# Patient Record
Sex: Female | Born: 1965 | Race: White | Hispanic: No | Marital: Single | Smoking: Former smoker
Health system: Southern US, Community
[De-identification: ages and names within clinical notes are randomized; demographics above are authoritative.]

## PROBLEM LIST (undated history)

## (undated) ENCOUNTER — Emergency Department (HOSPITAL_COMMUNITY): Discharge status: Against Medical Advice | Payer: Self-pay

## (undated) VITALS — BP 106/66 | HR 109 | Temp 97.6°F | Resp 16 | Ht 67.5 in | Wt 168.0 lb

## (undated) DIAGNOSIS — F1010 Alcohol abuse, uncomplicated: Secondary | ICD-10-CM

## (undated) DIAGNOSIS — E079 Disorder of thyroid, unspecified: Secondary | ICD-10-CM

## (undated) DIAGNOSIS — F419 Anxiety disorder, unspecified: Secondary | ICD-10-CM

## (undated) DIAGNOSIS — F1020 Alcohol dependence, uncomplicated: Secondary | ICD-10-CM

## (undated) DIAGNOSIS — D696 Thrombocytopenia, unspecified: Secondary | ICD-10-CM

## (undated) DIAGNOSIS — F1310 Sedative, hypnotic or anxiolytic abuse, uncomplicated: Secondary | ICD-10-CM

## (undated) DIAGNOSIS — M549 Dorsalgia, unspecified: Secondary | ICD-10-CM

## (undated) DIAGNOSIS — E039 Hypothyroidism, unspecified: Secondary | ICD-10-CM

## (undated) DIAGNOSIS — R569 Unspecified convulsions: Secondary | ICD-10-CM

## (undated) DIAGNOSIS — D649 Anemia, unspecified: Secondary | ICD-10-CM

## (undated) DIAGNOSIS — E162 Hypoglycemia, unspecified: Secondary | ICD-10-CM

## (undated) DIAGNOSIS — F32A Depression, unspecified: Secondary | ICD-10-CM

## (undated) DIAGNOSIS — F1110 Opioid abuse, uncomplicated: Secondary | ICD-10-CM

## (undated) DIAGNOSIS — F329 Major depressive disorder, single episode, unspecified: Secondary | ICD-10-CM

## (undated) DIAGNOSIS — T1491XA Suicide attempt, initial encounter: Secondary | ICD-10-CM

## (undated) DIAGNOSIS — K279 Peptic ulcer, site unspecified, unspecified as acute or chronic, without hemorrhage or perforation: Secondary | ICD-10-CM

## (undated) HISTORY — PX: TUBAL LIGATION: SHX77

## (undated) HISTORY — PX: CHOLECYSTECTOMY: SHX55

## (undated) HISTORY — PX: GASTRIC BYPASS: SHX52

## (undated) HISTORY — PX: ESOPHAGOGASTRODUODENOSCOPY: SHX1529

## (undated) HISTORY — PX: ABDOMINAL SURGERY: SHX537

---

## 2011-01-23 ENCOUNTER — Emergency Department (HOSPITAL_COMMUNITY)
Admission: EM | Admit: 2011-01-23 | Discharge: 2011-01-23 | Disposition: A | Discharge status: Home / Self Care | Payer: Self-pay | Attending: Emergency Medicine | Admitting: Emergency Medicine

## 2011-01-23 DIAGNOSIS — R109 Unspecified abdominal pain: Secondary | ICD-10-CM | POA: Insufficient documentation

## 2011-01-23 DIAGNOSIS — R350 Frequency of micturition: Secondary | ICD-10-CM | POA: Insufficient documentation

## 2011-01-23 DIAGNOSIS — N814 Uterovaginal prolapse, unspecified: Secondary | ICD-10-CM | POA: Insufficient documentation

## 2011-01-23 LAB — URINALYSIS, ROUTINE W REFLEX MICROSCOPIC
Bilirubin Urine: NEGATIVE
Ketones, ur: NEGATIVE mg/dL
Nitrite: NEGATIVE
Protein, ur: NEGATIVE mg/dL
Urobilinogen, UA: 0.2 mg/dL (ref 0.0–1.0)

## 2011-02-15 ENCOUNTER — Encounter (INDEPENDENT_AMBULATORY_CARE_PROVIDER_SITE_OTHER): Payer: Self-pay | Admitting: Physician Assistant

## 2011-02-15 ENCOUNTER — Other Ambulatory Visit: Payer: Self-pay | Admitting: Physician Assistant

## 2011-02-15 DIAGNOSIS — R102 Pelvic and perineal pain: Secondary | ICD-10-CM

## 2011-02-15 DIAGNOSIS — N949 Unspecified condition associated with female genital organs and menstrual cycle: Secondary | ICD-10-CM

## 2011-02-16 NOTE — Group Therapy Note (Signed)
Jacqueline [name], HUMANN             ACCOUNT NO.:  [id number]  MEDICAL RECORD NO.:  [id number]           PATIENT TYPE:  A  LOCATION:  [facility]                   FACILITY:  [facility]  PHYSICIAN:  Maylon Cos, CNM    DATE OF BIRTH:  06/28/1966  DATE OF SERVICE:  02/15/2011                                 CLINIC NOTE  The patient is being seen in GYN Clinic at [facility].  REASON FOR TODAY'S VISIT:  Followup from [facility] secondary to questionable bladder prolapse.  HISTORY OF PRESENT ILLNESS:  The patient was seen at [facility] ER on January 23, 2011, with lower abdominal pain.  The patient states that she went in believing that with lower abdominal pain that lasted for the last several months, she thought like she has had urinary symptoms, urinary tract infections that she has had several times before. Symptoms were also related to a vaginal bulge that started approximately 1 month ago that she noticed with urination that felt like a balloon coming from the vagina.  She also notices that when she has a bowel movement that she has a hard protrusion from the vagina as well that seems to be different from the balloon like.  She states that she was seen in the ER at [facility] and they told her that she likely had a bladder prolapse.  The ER note is unavailable at the time of her visit today, however, the patient states that they did check her urine and told her that she did not have urinary tract infection.  PERSONAL MEDICAL HISTORY:  She has no known drug allergies.  CURRENT MEDICATIONS:  Synthroid and omeprazole.  HEALTH CARE MAINTENANCE:  She is up to date for rubella, chickenpox, and tetanus.  Last mammogram was 2 years ago and it was normal.  She had a fecal occult blood in 2007 and it was normal.  She has never had a colonoscopy.  Her last dental exam was in 2012.  MENSTRUAL HISTORY:  She has regular cycles.  Menarche was at age 66. She has 28-day cycles.  Her periods  lasts for 5 days.  She says they are medium flow with minimal pain.  She has had tubal ligation.  She is not currently in a sexual relationship.  OBSTETRICAL HISTORY:  She is a gravida 5, para 3.  She had 1 miscarriage and 1 termination, and her last pregnancy was in February 1997.  She has had vaginal deliveries uncomplicated.  Her last Pap smear was done in November 2011, she has no history of abnormals.  STD history is negative.  PERSONAL MEDICAL HISTORY:  Positive for arthritis, stomach ulcers, anemia, and hypothyroid.  She had a GI bleed in 1997.  SURGICAL HISTORY:  She had a blood transfusion in 1997 when she had a GI bleed.  She had an EGD for repair of bleeding ulcer, cholecystectomy in 2006, gastric bypass in 2003, and tubal ligation in 1997.  SOCIAL HISTORY:  She is currently unemployed.  She does not smoke.  She does not drink alcohol.  She denies the use of IV drugs or any other illicit drugs.  She denies physical or  sexual abuse.  She has had a 1 partner in the last year.  FAMILY HISTORY:  Positive for diabetes in her grandparents, heart disease and heart attack in grandfather, high blood pressure in her grandparents, colon cancer in her grandfather, and DVT in  her grandmother.  Her history is negative for breast cancer, ovarian cancer, or uterine cancer.  SYSTEMIC REVIEW:  Positive for abdominal pain, urinary frequency, and stress incontinence that she further describes as she has minimal dribbling that she has to wear a panty liner for this, changes twice a day.  She also sometimes experiences dribbling after emptying her bladder that she stands and leaks urine afterwards.  PHYSICAL EXAMINATION:  GENERAL:  Today, Shamirah is a pleasant Caucasian female who appears her stated age of 45.  She is in no apparent distress. VITAL SIGNS:  Stable.  Her temperature is 97.5, her pulse is 70, her blood pressure is 122/74, her weight is 170.5, her height is 69  inches. HEENT:  Grossly normal. ABDOMEN:  Soft and nontender.  She does have suprapubic tenderness, more on the left than the right.  No guarding.  No rebound.  She has no pain with passive movement or shaking of the table.  She is able to get on and off the table without difficulty and heel drop test is negative. GENITOURINARY:  External structure of the genitalia are normal.  With bearing down, she does have a noted grade 2/grade 3 rectocele and grade 1 cystocele.  Bimanual exam reveals nonenlarged, nontender uterus. Nonenlarged, nontender adnexa.  On the right, she does have some tenderness.  On the left, however, there is no enlargement appreciated during examination. EXTREMITIES:  Equal and reactive x4 without edema in the lower extremities.  ASSESSMENT: 1. Pelvic pain. 2. Questionable interstitial cystitis.  PLAN: 1. Pelvic ultrasound. 2. Health questionnaire for IC. 3. TSH test has not been done since November and prescription will be     given as the patient is out of her Synthroid. 4. Kegel exercises literature and explanation has been given. 5. Also, sent the urine culture today to assess for infection,     possible cause of her lower abdominal pain.  I have a strong     suspicion that this could be urinary specifically interstitial     cystitis related.  If so, we will refer her to Urology, possibly     starting her on the trial of Elmiron while awaiting for consult.  FOLLOWUP:  The patient should follow up in 2-3 weeks after pelvic ultrasound to discuss results as well as assess any significant changes with Kegel exercises.          ______________________________ Maylon Cos, CNM    SS/MEDQ  D:  02/15/2011  T:  02/16/2011  Job:  [id number]

## 2011-02-21 ENCOUNTER — Other Ambulatory Visit (HOSPITAL_COMMUNITY): Payer: Self-pay

## 2011-02-21 ENCOUNTER — Ambulatory Visit (HOSPITAL_COMMUNITY): Admission: RE | Admit: 2011-02-21 | Payer: Self-pay | Source: Ambulatory Visit

## 2011-02-23 ENCOUNTER — Ambulatory Visit (HOSPITAL_COMMUNITY)
Admission: RE | Admit: 2011-02-23 | Discharge: 2011-02-23 | Disposition: A | Discharge status: Home / Self Care | Payer: Self-pay | Source: Ambulatory Visit | Attending: Physician Assistant | Admitting: Physician Assistant

## 2011-02-23 DIAGNOSIS — N949 Unspecified condition associated with female genital organs and menstrual cycle: Secondary | ICD-10-CM | POA: Insufficient documentation

## 2011-02-23 DIAGNOSIS — R102 Pelvic and perineal pain: Secondary | ICD-10-CM

## 2011-03-01 ENCOUNTER — Ambulatory Visit: Payer: Self-pay | Admitting: Physician Assistant

## 2011-03-15 ENCOUNTER — Ambulatory Visit (INDEPENDENT_AMBULATORY_CARE_PROVIDER_SITE_OTHER): Payer: Self-pay | Admitting: Physician Assistant

## 2011-03-15 DIAGNOSIS — N949 Unspecified condition associated with female genital organs and menstrual cycle: Secondary | ICD-10-CM

## 2011-03-16 NOTE — Group Therapy Note (Signed)
NAMEKENYIA, Jacqueline [name]             ACCOUNT NO.:  [id number]  MEDICAL RECORD NO.:  [id number]           PATIENT TYPE:  A  LOCATION:  [facility]                   FACILITY:  [facility]  PHYSICIAN:  Maylon Cos, CNM    DATE OF BIRTH:  1966-04-01  DATE OF SERVICE:  03/15/2011                                 CLINIC NOTE  The patient is being seen at the [facility].  Reason for today's visit is followup results of pelvic ultrasound and labs.  HISTORY OF PRESENT ILLNESS:  The patient is following up from previous visit with myself on May 10, as she presented with complaints of urinary symptoms and pelvic pressure that she was suspicious of urinary tract infection, however, she was diagnosed with a cystocele and rectocele, questionable bladder prolapse by the ER staff at [facility]. At the time of her last visit, I strongly questioned interstitial cystitis as the cause of her symptoms rather than being treat GYN related issues.  We did have her follow up with a pelvic ultrasound and lab work which she returns for those today.  Additionally, she has been Kegel exercises to work on her grade 2/3 rectocele and grade 1 cystocele.  She returns today without complaints and the following results were shared with the patient.  Pelvic ultrasound was done on May 18 was found to be normal-appearing uterus and ovaries with a small amount of simple free fluid that was likely physiologic noted.  The patient's TSH is 8.083.  An Endocrinology referral had already been made, the patient has appointment this afternoon.  The patient scored a much higher than normal on her puff test assessing for interstitial cystitis and plans at her last visit was to refer to Urology and started on Elmiron for further evaluation of possible IC diagnosis.  We have again discussed that again today.  The patient agrees with that plan.  ASSESSMENT: 1. Probable interstitial cystitis. 2. Continued pelvic  pain relieved by mild pain medication. 3. Hyperthyroidism.  PLAN: 1. The patient should follow up today with Dr. Kemper Durie in Endocrinology     as scheduled at [facility]. 2. The patient has been given a prescription for Elmiron 100 mg to be     taken 3 times a day along with discount card and additional ICD     information. 3. The patient is being made referral to [facility] for further     evaluation of IC symptoms or other urologic diagnosis.  FOLLOWUP:  The patient should follow up in our GYN Clinic in 3 months for reevaluation of symptoms or sooner if problems become worse.          ______________________________ Maylon Cos, CNM    SS/MEDQ  D:  03/15/2011  T:  03/16/2011  Job:  [id number]

## 2011-04-09 ENCOUNTER — Emergency Department (HOSPITAL_COMMUNITY)
Admission: EM | Admit: 2011-04-09 | Discharge: 2011-04-10 | Disposition: A | Discharge status: Home / Self Care | Payer: Self-pay | Attending: Emergency Medicine | Admitting: Emergency Medicine

## 2011-04-09 DIAGNOSIS — F1910 Other psychoactive substance abuse, uncomplicated: Secondary | ICD-10-CM | POA: Insufficient documentation

## 2011-04-09 DIAGNOSIS — Z79899 Other long term (current) drug therapy: Secondary | ICD-10-CM | POA: Insufficient documentation

## 2011-04-09 DIAGNOSIS — E039 Hypothyroidism, unspecified: Secondary | ICD-10-CM | POA: Insufficient documentation

## 2011-04-09 LAB — RAPID URINE DRUG SCREEN, HOSP PERFORMED
Amphetamines: NOT DETECTED
Benzodiazepines: NOT DETECTED
Opiates: NOT DETECTED
Tetrahydrocannabinol: NOT DETECTED

## 2011-04-09 LAB — BASIC METABOLIC PANEL
CO2: 24 mEq/L (ref 19–32)
Calcium: 8.5 mg/dL (ref 8.4–10.5)
Creatinine, Ser: 0.83 mg/dL (ref 0.50–1.10)
GFR calc non Af Amer: >60 mL/min (ref >60)
Glucose, Bld: 91 mg/dL (ref 70–99)

## 2011-04-09 LAB — DIFFERENTIAL
Basophils Absolute: 0.1 10*3/uL (ref 0.0–0.1)
Basophils Relative: 1 % (ref 0–1)
Eosinophils Absolute: 0.3 10*3/uL (ref 0.0–0.7)
Eosinophils Relative: 5 % (ref 0–5)
Monocytes Absolute: 0.6 10*3/uL (ref 0.1–1.0)
Monocytes Relative: 10 % (ref 3–12)
Neutro Abs: 2.8 10*3/uL (ref 1.7–7.7)

## 2011-04-09 LAB — CBC
Hemoglobin: 10.2 g/dL — ABNORMAL LOW (ref 12.0–15.0)
MCH: 29 pg (ref 26.0–34.0)
MCHC: 33.2 g/dL (ref 30.0–36.0)
RDW: 15.1 % (ref 11.5–15.5)

## 2011-04-09 LAB — URINALYSIS, ROUTINE W REFLEX MICROSCOPIC
Glucose, UA: NEGATIVE mg/dL
Ketones, ur: NEGATIVE mg/dL
Leukocytes, UA: NEGATIVE
Protein, ur: NEGATIVE mg/dL
Urobilinogen, UA: 0.2 mg/dL (ref 0.0–1.0)

## 2011-04-09 LAB — URINE MICROSCOPIC-ADD ON

## 2011-04-10 LAB — HEPATIC FUNCTION PANEL
Alkaline Phosphatase: 63 U/L (ref 39–117)
Bilirubin, Direct: 0.1 mg/dL (ref 0.0–0.3)
Indirect Bilirubin: 0.1 mg/dL — ABNORMAL LOW (ref 0.3–0.9)
Total Bilirubin: 0.2 mg/dL — ABNORMAL LOW (ref 0.3–1.2)

## 2011-04-10 LAB — ETHANOL: Alcohol, Ethyl (B): <11 mg/dL (ref 0–11)

## 2011-06-02 ENCOUNTER — Encounter: Payer: Self-pay | Admitting: Emergency Medicine

## 2011-06-02 ENCOUNTER — Emergency Department (HOSPITAL_COMMUNITY)
Admission: EM | Admit: 2011-06-02 | Discharge: 2011-06-02 | Discharge status: Against Medical Advice | Payer: Self-pay | Attending: Emergency Medicine | Admitting: Emergency Medicine

## 2011-06-02 DIAGNOSIS — F1021 Alcohol dependence, in remission: Secondary | ICD-10-CM | POA: Insufficient documentation

## 2011-06-02 DIAGNOSIS — F1910 Other psychoactive substance abuse, uncomplicated: Secondary | ICD-10-CM | POA: Insufficient documentation

## 2011-06-02 DIAGNOSIS — F341 Dysthymic disorder: Secondary | ICD-10-CM | POA: Insufficient documentation

## 2011-06-02 DIAGNOSIS — F17200 Nicotine dependence, unspecified, uncomplicated: Secondary | ICD-10-CM | POA: Insufficient documentation

## 2011-06-02 DIAGNOSIS — Z862 Personal history of diseases of the blood and blood-forming organs and certain disorders involving the immune mechanism: Secondary | ICD-10-CM | POA: Insufficient documentation

## 2011-06-02 HISTORY — DX: Peptic ulcer, site unspecified, unspecified as acute or chronic, without hemorrhage or perforation: K27.9

## 2011-06-02 HISTORY — DX: Anxiety disorder, unspecified: F41.9

## 2011-06-02 HISTORY — DX: Depression, unspecified: F32.A

## 2011-06-02 HISTORY — DX: Anemia, unspecified: D64.9

## 2011-06-02 HISTORY — DX: Major depressive disorder, single episode, unspecified: F32.9

## 2011-06-02 HISTORY — DX: Sedative, hypnotic or anxiolytic abuse, uncomplicated: F13.10

## 2011-06-02 HISTORY — DX: Alcohol abuse, uncomplicated: F10.10

## 2011-06-02 LAB — DIFFERENTIAL
Basophils Absolute: 0.1 10*3/uL (ref 0.0–0.1)
Basophils Relative: 1 % (ref 0–1)
Monocytes Absolute: 0.8 10*3/uL (ref 0.1–1.0)
Neutro Abs: 5.7 10*3/uL (ref 1.7–7.7)
Neutrophils Relative %: 69 % (ref 43–77)

## 2011-06-02 LAB — CBC
MCHC: 31.9 g/dL (ref 30.0–36.0)
Platelets: 588 10*3/uL — ABNORMAL HIGH (ref 150–400)
RDW: 16.8 % — ABNORMAL HIGH (ref 11.5–15.5)

## 2011-06-02 LAB — URINALYSIS, ROUTINE W REFLEX MICROSCOPIC
Bilirubin Urine: NEGATIVE
Leukocytes, UA: NEGATIVE
Nitrite: NEGATIVE
Specific Gravity, Urine: <1.005 — ABNORMAL LOW (ref 1.005–1.030)
pH: 5.5 (ref 5.0–8.0)

## 2011-06-02 LAB — BASIC METABOLIC PANEL
Chloride: 100 mEq/L (ref 96–112)
Creatinine, Ser: 1.01 mg/dL (ref 0.50–1.10)
GFR calc Af Amer: >60 mL/min (ref >60)
Potassium: 3.6 mEq/L (ref 3.5–5.1)
Sodium: 136 mEq/L (ref 135–145)

## 2011-06-02 LAB — PREGNANCY, URINE: Preg Test, Ur: NEGATIVE

## 2011-06-02 LAB — RAPID URINE DRUG SCREEN, HOSP PERFORMED
Amphetamines: NOT DETECTED
Opiates: NOT DETECTED

## 2011-06-02 MED ORDER — NICOTINE 14 MG/24HR TD PT24
14.0000 mg | MEDICATED_PATCH | Freq: Once | TRANSDERMAL | Status: DC
  Administered 2011-06-02: 14 mg via TRANSDERMAL
  Filled 2011-06-02: qty 1

## 2011-06-02 NOTE — ED Notes (Signed)
Patient ambulatory to restroom with steady gait. Patient returned to room without complications. Lab at bedside to collect blood.  Patient remains restless, and requesting to see fiance multiple times. Stating "if he doesn't go to rehab, I'll leave, I won't go either".  Aunt is at bedside.

## 2011-06-02 NOTE — ED Notes (Signed)
Patient belongings (clothes, purse, shoes) returned to patient prior to leaving the emergency department.

## 2011-06-02 NOTE — ED Notes (Signed)
Ice chips provided to pt per request.

## 2011-06-02 NOTE — ED Notes (Signed)
Patient also reports having some bright red rectal bleeding x4-5 days ago. Pt has hx of bleeding peptic ulcers.

## 2011-06-02 NOTE — ED Provider Notes (Signed)
Scribed for [employer]. Bernette Mayers, MD, the patient was seen in room APA15/APA15. This chart was scribed by [employer]. The patient's care started at 12:19  CSN: [id number] Arrival date & time: 06/02/2011 10:40 AM  Chief Complaint  Patient presents with  . Addiction Problem   HPI Jacqueline [name] is a 45 y.o. female with a history of Alcohol use, who presents to the Emergency Department complaining of addiction problem. Patient reports she is "hooked on benzodiazepines , lysterine and EtOH" and that her "drinking has been out of control". Patient states she used all three drugs today. She states that she has been through detox thee times in the past. There are no other associated symptoms and no other alleviating or aggravating factors.    Past Medical History  Diagnosis Date  . Peptic ulcer   . Alcohol abuse   . Anemia   . Benzodiazepine abuse   . Depression   . Anxiety     Past Surgical History  Procedure Date  . Cholecystectomy   . Abdominal surgery   . Esophagogastroduodenoscopy   . Gastric bypass   . Tubal ligation     Family History  Problem Relation Age of Onset  . Cancer Father   . Cancer Other     History  Substance Use Topics  . Smoking status: Current Everyday Smoker -- 1.0 packs/day for 10 years    Types: Cigarettes  . Smokeless tobacco: Never Used  . Alcohol Use: Yes     patient states  "a lot."    OB History    Grav Para Term Preterm Abortions TAB SAB Ect Mult Living   3 3  3      3       Review of Systems  Constitutional: Negative for activity change and appetite change.  Gastrointestinal: Negative for nausea, vomiting and diarrhea.  Neurological: Negative for light-headedness.  All other systems reviewed and are negative.    Physical Exam  BP 117/77  Pulse 81  Temp(Src) 98 F (36.7 C) (Oral)  Resp 18  Ht 5\' 9"  (1.753 m)  Wt 175 lb (79.379 kg)  BMI 25.84 kg/m2  SpO2 100%  LMP 06/02/2011  Physical Exam  Constitutional: She is  oriented to person, place, and time. She appears well-developed and well-nourished. No distress.  HENT:  Head: Normocephalic and atraumatic.  Mouth/Throat: No oropharyngeal exudate.  Eyes: Conjunctivae and EOM are normal. Pupils are equal, round, and reactive to light.  Neck: Neck supple. No tracheal deviation present.  Cardiovascular: Normal rate, regular rhythm and normal heart sounds.   No murmur heard. Pulmonary/Chest: Effort normal and breath sounds normal. No respiratory distress. She has no wheezes. She has no rales.  Abdominal: Soft. Bowel sounds are normal. She exhibits no distension. There is no tenderness. There is no rebound and no guarding.  Musculoskeletal: She exhibits no edema.  Neurological: She is alert and oriented to person, place, and time. No cranial nerve deficit.  Skin: Skin is warm and dry. No rash noted. No erythema.  Psychiatric:       Argumentative No suicidal or homicidal ideations    ED Course  Procedures  OTHER DATA REVIEWED: Nursing notes, vital signs, and past medical records reviewed.    DIAGNOSTIC STUDIES: Oxygen Saturation is 100% on room air, normal by my interpretation.     ED COURSE / COORDINATION OF CARE: 12:20 -EDMD discussed case with patient and performed a Hemoccult test. (Chaperone present). EDMD ordered the following   MDM: Pt  cleared medically. She required redirection to her room numerous times and was demanding to leave room to see fiance (also her for medical clearance) and to go outside to smoke. She was advised multiple times to stay in her room. ACT team has evaluated her and she is demanding Ultram for a headache. Offered her Tylenol at which time she became angry and confrontational and stated she wanted to leave. She is not suicidal or homicidal and is here voluntarily. No evidence of clinical intoxication and no reason to hold her against her will. Will have her sign AMA form.     Marnee Sherrard B. Bernette Mayers, MD 06/02/11 1429

## 2011-06-02 NOTE — ED Notes (Signed)
Patient refusing to stay and wants to leave AMA. Patient spoke with [facility] with ACT team. Dr. Bernette Mayers made aware of patient wanting to leave. Spoke with [facility] and she reports patient does not qualify for commitment and is free to leave AMA if she wants. Patient refusing to sign AMA form and left department with a family member. Patient in NAD at time of departure and left ED with steady gait.

## 2011-06-02 NOTE — ED Notes (Signed)
Patient requesting detox for benzo and alcohol. Patient reports taking benzo and drinking Listerine "a few hours ago."

## 2011-06-02 NOTE — ED Notes (Signed)
Patient reports wanting detox from ETOH and klonopin. Denies taking any other drugs. Patient reports drinking approximately 24 12oz beers per day, "especially since I ran out of klonopin. I didn't know what else to do". Reports being out of Klonopin for 3 days now. States she is "shaky and anxious".

## 2011-06-02 NOTE — ED Notes (Signed)
Pt requested to see AC.  Cecile Sheerer [facility] spoke with pt and pt was asking for her pocket book.  Pt says she has money in her purse.  Went in room with [facility] and offered to get money out of purse to lock up with security but pt refused, locked pocket book in cabinet in ED.

## 2011-06-04 ENCOUNTER — Encounter (HOSPITAL_COMMUNITY): Payer: Self-pay | Admitting: Emergency Medicine

## 2011-06-04 ENCOUNTER — Emergency Department (HOSPITAL_COMMUNITY)
Admission: EM | Admit: 2011-06-04 | Discharge: 2011-06-04 | Disposition: A | Discharge status: Home / Self Care | Payer: Self-pay | Attending: Emergency Medicine | Admitting: Emergency Medicine

## 2011-06-04 DIAGNOSIS — F1910 Other psychoactive substance abuse, uncomplicated: Secondary | ICD-10-CM | POA: Insufficient documentation

## 2011-06-04 DIAGNOSIS — F17200 Nicotine dependence, unspecified, uncomplicated: Secondary | ICD-10-CM | POA: Insufficient documentation

## 2011-06-04 DIAGNOSIS — F341 Dysthymic disorder: Secondary | ICD-10-CM | POA: Insufficient documentation

## 2011-06-04 HISTORY — DX: Opioid abuse, uncomplicated: F11.10

## 2011-06-04 HISTORY — DX: Dorsalgia, unspecified: M54.9

## 2011-06-04 HISTORY — DX: Disorder of thyroid, unspecified: E07.9

## 2011-06-04 HISTORY — DX: Alcohol dependence, uncomplicated: F10.20

## 2011-06-04 LAB — BASIC METABOLIC PANEL
CO2: 23 mEq/L (ref 19–32)
Chloride: 99 mEq/L (ref 96–112)
Potassium: 3.6 mEq/L (ref 3.5–5.1)
Sodium: 136 mEq/L (ref 135–145)

## 2011-06-04 LAB — CBC
HCT: 32.5 % — ABNORMAL LOW (ref 36.0–46.0)
RDW: 16.8 % — ABNORMAL HIGH (ref 11.5–15.5)
WBC: 7 10*3/uL (ref 4.0–10.5)

## 2011-06-04 LAB — DIFFERENTIAL
Basophils Absolute: 0.1 10*3/uL (ref 0.0–0.1)
Lymphocytes Relative: 17 % (ref 12–46)
Monocytes Absolute: 0.9 10*3/uL (ref 0.1–1.0)
Neutro Abs: 4.8 10*3/uL (ref 1.7–7.7)
Neutrophils Relative %: 68 % (ref 43–77)

## 2011-06-04 LAB — HEPATIC FUNCTION PANEL
ALT: 22 U/L (ref 0–35)
AST: 26 U/L (ref 0–37)
Alkaline Phosphatase: 68 U/L (ref 39–117)
Bilirubin, Direct: 0.1 mg/dL (ref 0.0–0.3)
Indirect Bilirubin: 0.1 mg/dL — ABNORMAL LOW (ref 0.3–0.9)

## 2011-06-04 LAB — RAPID URINE DRUG SCREEN, HOSP PERFORMED
Benzodiazepines: NOT DETECTED
Cocaine: POSITIVE — AB
Opiates: NOT DETECTED

## 2011-06-04 LAB — ETHANOL: Alcohol, Ethyl (B): 134 mg/dL — ABNORMAL HIGH (ref 0–11)

## 2011-06-04 LAB — PREGNANCY, URINE: Preg Test, Ur: NEGATIVE

## 2011-06-04 MED ORDER — ALUM & MAG HYDROXIDE-SIMETH 200-200-20 MG/5ML PO SUSP: 30.0000 mL | ORAL | Status: DC | PRN

## 2011-06-04 MED ORDER — LORAZEPAM 1 MG PO TABS: 1.0000 mg | ORAL_TABLET | Freq: Three times a day (TID) | ORAL | Status: DC | PRN

## 2011-06-04 MED ORDER — LORAZEPAM 1 MG PO TABS
1.0000 mg | ORAL_TABLET | Freq: Once | ORAL | Status: AC
  Administered 2011-06-04: 1 mg via ORAL
  Filled 2011-06-04: qty 1

## 2011-06-04 MED ORDER — ACETAMINOPHEN 325 MG PO TABS: 650.0000 mg | ORAL_TABLET | ORAL | Status: DC | PRN

## 2011-06-04 MED ORDER — ZOLPIDEM TARTRATE 5 MG PO TABS: 5.0000 mg | ORAL_TABLET | Freq: Every evening | ORAL | Status: DC | PRN

## 2011-06-04 MED ORDER — PANTOPRAZOLE SODIUM 40 MG PO TBEC
80.0000 mg | DELAYED_RELEASE_TABLET | Freq: Every day | ORAL | Status: DC
  Administered 2011-06-04: 80 mg via ORAL
  Filled 2011-06-04: qty 2

## 2011-06-04 MED ORDER — NICOTINE 21 MG/24HR TD PT24
21.0000 mg | MEDICATED_PATCH | Freq: Once | TRANSDERMAL | Status: DC
  Administered 2011-06-04: 21 mg via TRANSDERMAL
  Filled 2011-06-04: qty 1
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
## 2011-06-04 NOTE — ED Notes (Signed)
Pt wants detox from alcohol and klonopin. Pt states she has been drinking daily x 10years.

## 2011-06-04 NOTE — ED Notes (Signed)
Hot meal given. Nad. Pt appears less anxious at this time. Aware awaiting ACT for eval.
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
## 2011-06-08 ENCOUNTER — Encounter (HOSPITAL_COMMUNITY): Payer: Self-pay

## 2011-06-08 ENCOUNTER — Emergency Department (HOSPITAL_COMMUNITY)
Admission: EM | Admit: 2011-06-08 | Discharge: 2011-06-09 | Disposition: A | Discharge status: Home / Self Care | Payer: Self-pay | Attending: Emergency Medicine | Admitting: Emergency Medicine

## 2011-06-08 DIAGNOSIS — F17200 Nicotine dependence, unspecified, uncomplicated: Secondary | ICD-10-CM | POA: Insufficient documentation

## 2011-06-08 DIAGNOSIS — F1020 Alcohol dependence, uncomplicated: Secondary | ICD-10-CM | POA: Insufficient documentation

## 2011-06-08 LAB — RAPID URINE DRUG SCREEN, HOSP PERFORMED: Amphetamines: NOT DETECTED

## 2011-06-08 NOTE — ED Notes (Signed)
Pt brought to er by [facility] for eval, pt just released today from rehab for alcohol abuse, had 1 beer and mother took out commitment papers.  Is scheduled for 14 day re[facility] , denies any si/hi.

## 2011-06-08 NOTE — ED Notes (Signed)
Pt belonigings removed and bagged, by staff, in custody of [facility],

## 2011-06-09 NOTE — ED Provider Notes (Signed)
History     CSN: [id number] Arrival date & time: 06/08/2011 11:01 PM  Chief Complaint  Patient presents with  . Medical Clearance   HPI Comments: The patient presents under involuntary commitment papers signed by her mother. The patient reports that she is an alcoholic and just got out of an inpatient rehabilitation stay and one day ago. Today she drank one beer, and her mother got angry and concerned, and filed the involuntary commitment paperwork to have her committed for inpatient treatment of alcohol abuse. The patient reports that she does have depression, however is taking her medications for this and feels that her depressive symptoms are controlled at this time. She sincerely and adamantly denies any and all suicidal ideation or homicidal ideation. She appears calm, reasonable, rational, and with an intact capability of for logical reasoning and judgment.  She appears competent to make her own medical decisions. She is forthcoming with her history. She reports that she is scheduled to begin intensive outpatient treatment for her alcoholism with [facility] in 3 days, and has a spot in an inpatient alcohol abuse treatment program through [facility] in 4 days. She does not want to be admitted for inpatient treatment at this time and doesn't feel like she needs that. She does report that she has some reservations about how ready she is to quit drinking completely. However at this point the patient does not appear to be of unstable mind or acute psychosis to where she requires involuntary commitment. She appears competent to make her own decisions and to refuse treatment. Furthermore I don't find her appropriate at this time for involuntary inpatient alcohol abuse treatment.   Patient is a 45 y.o. female presenting with alcohol problem. The history is provided by the patient.  Alcohol Problem This is a chronic problem. The current episode started more than 1 week ago. The problem  occurs daily. The problem has been gradually improving. Pertinent negatives include no chest pain, no abdominal pain, no headaches and no shortness of breath. The symptoms are aggravated by nothing. The symptoms are relieved by nothing. Treatments tried: recent inpatient alcohol abuse treatment. The treatment provided moderate relief.    Past Medical History  Diagnosis Date  . Peptic ulcer   . Alcohol abuse   . Anemia   . Benzodiazepine abuse   . Depression   . Anxiety   . Thyroid disease   . Alcoholism   . Narcotic abuse   . Back pain     Past Surgical History  Procedure Date  . Cholecystectomy   . Abdominal surgery   . Esophagogastroduodenoscopy   . Gastric bypass   . Tubal ligation     Family History  Problem Relation Age of Onset  . Cancer Father   . Cancer Other     History  Substance Use Topics  . Smoking status: Current Everyday Smoker -- 1.0 packs/day for 10 years    Types: Cigarettes  . Smokeless tobacco: Never Used  . Alcohol Use: Yes     patient states  "a lot."    OB History    Grav Para Term Preterm Abortions TAB SAB Ect Mult Living   3 3  3      3       Review of Systems  Constitutional: Negative for fever, chills and appetite change.  HENT: Negative for ear pain, congestion, sore throat, rhinorrhea, drooling, trouble swallowing, neck pain, neck stiffness, voice change and postnasal drip.   Eyes: Negative for  photophobia and visual disturbance.  Respiratory: Negative for cough, chest tightness, shortness of breath and wheezing.   Cardiovascular: Negative for chest pain and palpitations.  Gastrointestinal: Negative for nausea, vomiting, abdominal pain, diarrhea, constipation and abdominal distention.  Genitourinary: Negative.   Musculoskeletal: Negative for myalgias, back pain, joint swelling, arthralgias and gait problem.  Skin: Negative for color change, pallor, rash and wound.  Neurological: Negative for tremors, seizures, syncope, facial  asymmetry, speech difficulty, weakness, light-headedness, numbness and headaches.  Psychiatric/Behavioral: Negative for suicidal ideas, hallucinations, confusion, self-injury, dysphoric mood and agitation. The patient is not nervous/anxious.     Physical Exam  BP 124/101  Pulse 107  Temp(Src) 98 F (36.7 C) (Oral)  Resp 16  SpO2 98%  LMP 06/02/2011  Physical Exam  Nursing note and vitals reviewed. Constitutional: She is oriented to person, place, and time. She appears well-developed and well-nourished. No distress.  HENT:  Head: Normocephalic and atraumatic.  Mouth/Throat: Oropharynx is clear and moist.  Eyes: Conjunctivae and EOM are normal.  Neck: Normal range of motion.  Cardiovascular: Normal rate, regular rhythm, normal heart sounds and intact distal pulses.  Exam reveals no gallop and no friction rub.   No murmur heard. Pulmonary/Chest: Effort normal and breath sounds normal. No respiratory distress. She has no wheezes. She has no rales. She exhibits no tenderness.  Abdominal: Soft. Bowel sounds are normal. She exhibits no distension. There is no tenderness. There is no rebound and no guarding.  Musculoskeletal: Normal range of motion. She exhibits no edema and no tenderness.  Neurological: She is alert and oriented to person, place, and time. She has normal reflexes. No cranial nerve deficit. She exhibits normal muscle tone. Coordination normal.  Skin: Skin is warm and dry. No rash noted. She is not diaphoretic. No erythema. No pallor.  Psychiatric: Her speech is normal and behavior is normal. Judgment and thought content normal. Her mood appears not anxious. Her affect is not angry, not blunt, not labile and not inappropriate. Thought content is not paranoid and not delusional. Cognition and memory are normal. She does not exhibit a depressed mood. She expresses no homicidal and no suicidal ideation. She expresses no suicidal plans and no homicidal plans.    ED Course    Procedures  MDM The patient is stable for release from the ED to outpatient follow up for alcoholism      Felisa Bonier, MD 06/12/11 1920

## 2011-06-09 NOTE — ED Notes (Signed)
pt left the er stating no needs pt reiderated her verbal contract for no self harm. Pt left with the [facility] [employer]

## 2011-06-12 ENCOUNTER — Encounter (HOSPITAL_COMMUNITY): Payer: Self-pay | Admitting: Emergency Medicine

## 2011-06-12 ENCOUNTER — Inpatient Hospital Stay (HOSPITAL_COMMUNITY)
Admission: EM | Admit: 2011-06-12 | Discharge: 2011-06-18 | DRG: 897 | Disposition: A | Discharge status: Other Acute Inpatient Hospital | Payer: Self-pay | Attending: Internal Medicine | Admitting: Internal Medicine

## 2011-06-12 DIAGNOSIS — M545 Low back pain, unspecified: Secondary | ICD-10-CM | POA: Diagnosis present

## 2011-06-12 DIAGNOSIS — F329 Major depressive disorder, single episode, unspecified: Secondary | ICD-10-CM | POA: Diagnosis present

## 2011-06-12 DIAGNOSIS — F10932 Alcohol use, unspecified with withdrawal with perceptual disturbance: Secondary | ICD-10-CM | POA: Diagnosis present

## 2011-06-12 DIAGNOSIS — F1020 Alcohol dependence, uncomplicated: Secondary | ICD-10-CM | POA: Diagnosis present

## 2011-06-12 DIAGNOSIS — E039 Hypothyroidism, unspecified: Secondary | ICD-10-CM | POA: Diagnosis present

## 2011-06-12 DIAGNOSIS — D649 Anemia, unspecified: Secondary | ICD-10-CM | POA: Diagnosis present

## 2011-06-12 DIAGNOSIS — F1910 Other psychoactive substance abuse, uncomplicated: Secondary | ICD-10-CM | POA: Diagnosis present

## 2011-06-12 DIAGNOSIS — F1310 Sedative, hypnotic or anxiolytic abuse, uncomplicated: Secondary | ICD-10-CM | POA: Diagnosis present

## 2011-06-12 DIAGNOSIS — F1410 Cocaine abuse, uncomplicated: Secondary | ICD-10-CM | POA: Diagnosis present

## 2011-06-12 DIAGNOSIS — N760 Acute vaginitis: Secondary | ICD-10-CM | POA: Diagnosis present

## 2011-06-12 DIAGNOSIS — D696 Thrombocytopenia, unspecified: Secondary | ICD-10-CM | POA: Diagnosis present

## 2011-06-12 DIAGNOSIS — F10231 Alcohol dependence with withdrawal delirium: Secondary | ICD-10-CM

## 2011-06-12 DIAGNOSIS — F10232 Alcohol dependence with withdrawal with perceptual disturbance: Secondary | ICD-10-CM | POA: Diagnosis present

## 2011-06-12 DIAGNOSIS — F32A Depression, unspecified: Secondary | ICD-10-CM | POA: Diagnosis present

## 2011-06-12 DIAGNOSIS — N390 Urinary tract infection, site not specified: Secondary | ICD-10-CM | POA: Diagnosis not present

## 2011-06-12 DIAGNOSIS — F419 Anxiety disorder, unspecified: Secondary | ICD-10-CM | POA: Diagnosis present

## 2011-06-12 DIAGNOSIS — A499 Bacterial infection, unspecified: Secondary | ICD-10-CM | POA: Diagnosis present

## 2011-06-12 DIAGNOSIS — F341 Dysthymic disorder: Secondary | ICD-10-CM | POA: Diagnosis present

## 2011-06-12 DIAGNOSIS — F10951 Alcohol use, unspecified with alcohol-induced psychotic disorder with hallucinations: Principal | ICD-10-CM | POA: Diagnosis present

## 2011-06-12 DIAGNOSIS — F1110 Opioid abuse, uncomplicated: Secondary | ICD-10-CM | POA: Diagnosis present

## 2011-06-12 DIAGNOSIS — G8929 Other chronic pain: Secondary | ICD-10-CM | POA: Diagnosis present

## 2011-06-12 DIAGNOSIS — B9689 Other specified bacterial agents as the cause of diseases classified elsewhere: Secondary | ICD-10-CM | POA: Diagnosis not present

## 2011-06-12 HISTORY — DX: Thrombocytopenia, unspecified: D69.6

## 2011-06-12 LAB — BASIC METABOLIC PANEL
BUN: 7 mg/dL (ref 6–23)
Calcium: 9 mg/dL (ref 8.4–10.5)
GFR calc Af Amer: >60 mL/min (ref >60)
GFR calc non Af Amer: >60 mL/min (ref >60)
Glucose, Bld: 93 mg/dL (ref 70–99)
Potassium: 4 mEq/L (ref 3.5–5.1)
Sodium: 137 mEq/L (ref 135–145)

## 2011-06-12 LAB — URINE MICROSCOPIC-ADD ON

## 2011-06-12 LAB — CBC
HCT: 35.1 % — ABNORMAL LOW (ref 36.0–46.0)
HCT: 33.8 % — ABNORMAL LOW (ref 36.0–46.0)
Hemoglobin: 11.1 g/dL — ABNORMAL LOW (ref 12.0–15.0)
Hemoglobin: 10.9 g/dL — ABNORMAL LOW (ref 12.0–15.0)
MCH: 27.7 pg (ref 26.0–34.0)
MCHC: 32.2 g/dL (ref 30.0–36.0)
MCV: 85.8 fL (ref 78.0–100.0)
RBC: 4.11 MIL/uL (ref 3.87–5.11)

## 2011-06-12 LAB — URINALYSIS, ROUTINE W REFLEX MICROSCOPIC
Glucose, UA: NEGATIVE mg/dL
Specific Gravity, Urine: 1.015 (ref 1.005–1.030)
Urobilinogen, UA: 0.2 mg/dL (ref 0.0–1.0)

## 2011-06-12 LAB — DIFFERENTIAL
Lymphocytes Relative: 21 % (ref 12–46)
Lymphs Abs: 1.5 10*3/uL (ref 0.7–4.0)
Monocytes Absolute: 0.8 10*3/uL (ref 0.1–1.0)
Monocytes Relative: 12 % (ref 3–12)
Neutro Abs: 4.4 10*3/uL (ref 1.7–7.7)
Neutrophils Relative %: 63 % (ref 43–77)

## 2011-06-12 LAB — RAPID URINE DRUG SCREEN, HOSP PERFORMED
Barbiturates: NOT DETECTED
Tetrahydrocannabinol: NOT DETECTED

## 2011-06-12 LAB — CREATININE, SERUM: GFR calc non Af Amer: >60 mL/min (ref >60)

## 2011-06-12 LAB — ETHANOL: Alcohol, Ethyl (B): 75 mg/dL — ABNORMAL HIGH (ref 0–11)

## 2011-06-12 MED ORDER — LEVOTHYROXINE SODIUM 112 MCG PO TABS
112.0000 ug | ORAL_TABLET | Freq: Every day | ORAL | Status: DC
  Administered 2011-06-13 – 2011-06-18 (×6): 112 ug via ORAL
  Filled 2011-06-12 (×10): qty 1

## 2011-06-12 MED ORDER — CALAMINE EX LOTN
TOPICAL_LOTION | CUTANEOUS | Status: AC
  Filled 2011-06-12: qty 118

## 2011-06-12 MED ORDER — ACETAMINOPHEN 650 MG RE SUPP: 650.0000 mg | Freq: Four times a day (QID) | RECTAL | Status: DC | PRN

## 2011-06-12 MED ORDER — M.V.I. ADULT IV INJ
INJECTION | INTRAVENOUS | Status: AC
  Filled 2011-06-12: qty 10

## 2011-06-12 MED ORDER — SERTRALINE HCL 50 MG PO TABS
100.0000 mg | ORAL_TABLET | Freq: Every day | ORAL | Status: DC
  Administered 2011-06-12 – 2011-06-18 (×9): 100 mg via ORAL
  Filled 2011-06-12 (×2): qty 1
  Filled 2011-06-12 (×2): qty 2
  Filled 2011-06-12: qty 1
  Filled 2011-06-12: qty 2
  Filled 2011-06-12: qty 1
  Filled 2011-06-12: qty 2

## 2011-06-12 MED ORDER — PANTOPRAZOLE SODIUM 40 MG PO TBEC
40.0000 mg | DELAYED_RELEASE_TABLET | Freq: Once | ORAL | Status: AC
  Administered 2011-06-12: 40 mg via ORAL
  Filled 2011-06-12: qty 1

## 2011-06-12 MED ORDER — CHLORDIAZEPOXIDE HCL 25 MG PO CAPS
25.0000 mg | ORAL_CAPSULE | Freq: Once | ORAL | Status: AC
  Administered 2011-06-12: 25 mg via ORAL
  Filled 2011-06-12: qty 1

## 2011-06-12 MED ORDER — LORAZEPAM 1 MG PO TABS
1.0000 mg | ORAL_TABLET | Freq: Four times a day (QID) | ORAL | Status: DC | PRN
  Administered 2011-06-12 – 2011-06-13 (×3): 1 mg via ORAL
  Filled 2011-06-12 (×3): qty 1

## 2011-06-12 MED ORDER — ONDANSETRON HCL 4 MG/2ML IJ SOLN: 4.0000 mg | Freq: Four times a day (QID) | INTRAMUSCULAR | Status: DC | PRN

## 2011-06-12 MED ORDER — LORAZEPAM 2 MG/ML IJ SOLN: 1.0000 mg | Freq: Four times a day (QID) | INTRAMUSCULAR | Status: DC | PRN

## 2011-06-12 MED ORDER — ALUM & MAG HYDROXIDE-SIMETH 200-200-20 MG/5ML PO SUSP
30.0000 mL | Freq: Four times a day (QID) | ORAL | Status: DC | PRN
  Administered 2011-06-12: 30 mL via ORAL
  Filled 2011-06-12: qty 30

## 2011-06-12 MED ORDER — ONDANSETRON HCL 4 MG PO TABS: 4.0000 mg | ORAL_TABLET | Freq: Four times a day (QID) | ORAL | Status: DC | PRN

## 2011-06-12 MED ORDER — LORAZEPAM 1 MG PO TABS
2.0000 mg | ORAL_TABLET | Freq: Once | ORAL | Status: AC
  Administered 2011-06-12: 2 mg via ORAL
  Filled 2011-06-12: qty 2

## 2011-06-12 MED ORDER — SODIUM CHLORIDE 0.9 % IJ SOLN
INTRAMUSCULAR | Status: AC
  Administered 2011-06-12: 20:00:00
  Filled 2011-06-12: qty 10

## 2011-06-12 MED ORDER — ENOXAPARIN SODIUM 40 MG/0.4ML ~~LOC~~ SOLN
40.0000 mg | SUBCUTANEOUS | Status: DC
  Administered 2011-06-12 – 2011-06-14 (×3): 40 mg via SUBCUTANEOUS
  Filled 2011-06-12 (×3): qty 0.4

## 2011-06-12 MED ORDER — CALAMINE EX LOTN
TOPICAL_LOTION | Freq: Four times a day (QID) | CUTANEOUS | Status: DC
  Administered 2011-06-12 – 2011-06-18 (×13): via TOPICAL
  Filled 2011-06-12: qty 118

## 2011-06-12 MED ORDER — LORAZEPAM 1 MG PO TABS
1.0000 mg | ORAL_TABLET | Freq: Once | ORAL | Status: AC
  Administered 2011-06-12: 1 mg via ORAL
  Filled 2011-06-12: qty 1

## 2011-06-12 MED ORDER — THIAMINE HCL 100 MG/ML IJ SOLN
INTRAMUSCULAR | Status: AC
  Filled 2011-06-12: qty 2

## 2011-06-12 MED ORDER — SERTRALINE HCL 100 MG PO TABS
100.0000 mg | ORAL_TABLET | Freq: Every day | ORAL | Status: DC
  Filled 2011-06-12: qty 1

## 2011-06-12 MED ORDER — CHLORDIAZEPOXIDE HCL 25 MG PO CAPS
25.0000 mg | ORAL_CAPSULE | Freq: Three times a day (TID) | ORAL | Status: DC
  Administered 2011-06-12 – 2011-06-13 (×2): 25 mg via ORAL
  Filled 2011-06-12 (×2): qty 1

## 2011-06-12 MED ORDER — THIAMINE HCL 100 MG/ML IJ SOLN
Freq: Once | INTRAVENOUS | Status: AC
  Administered 2011-06-12: 21:00:00 via INTRAVENOUS
  Filled 2011-06-12: qty 1000

## 2011-06-12 MED ORDER — BUPROPION HCL ER (XL) 150 MG PO TB24
150.0000 mg | ORAL_TABLET | Freq: Every day | ORAL | Status: DC
  Administered 2011-06-12 – 2011-06-18 (×10): 150 mg via ORAL
  Filled 2011-06-12 (×10): qty 1

## 2011-06-12 MED ORDER — PANTOPRAZOLE SODIUM 40 MG PO TBEC
40.0000 mg | DELAYED_RELEASE_TABLET | Freq: Two times a day (BID) | ORAL | Status: DC
  Administered 2011-06-13 – 2011-06-18 (×11): 40 mg via ORAL
  Filled 2011-06-12 (×12): qty 1

## 2011-06-12 MED ORDER — FOLIC ACID 1 MG PO TABS
1.0000 mg | ORAL_TABLET | Freq: Every day | ORAL | Status: DC
  Administered 2011-06-13 – 2011-06-18 (×8): 1 mg via ORAL
  Filled 2011-06-12 (×6): qty 1

## 2011-06-12 MED ORDER — SODIUM CHLORIDE 0.9 % IV SOLN
INTRAVENOUS | Status: DC
  Administered 2011-06-13 – 2011-06-14 (×2): via INTRAVENOUS

## 2011-06-12 MED ORDER — NICOTINE 21 MG/24HR TD PT24
21.0000 mg | MEDICATED_PATCH | Freq: Once | TRANSDERMAL | Status: AC
  Administered 2011-06-12 – 2011-06-13 (×2): 21 mg via TRANSDERMAL
  Filled 2011-06-12: qty 1

## 2011-06-12 MED ORDER — VITAMIN B-1 100 MG PO TABS
100.0000 mg | ORAL_TABLET | Freq: Every day | ORAL | Status: DC
  Administered 2011-06-13 – 2011-06-18 (×8): 100 mg via ORAL
  Filled 2011-06-12 (×6): qty 1

## 2011-06-12 MED ORDER — NICOTINE 21 MG/24HR TD PT24
21.0000 mg | MEDICATED_PATCH | Freq: Every day | TRANSDERMAL | Status: DC
  Administered 2011-06-14 – 2011-06-18 (×6): 21 mg via TRANSDERMAL
  Filled 2011-06-12 (×6): qty 1

## 2011-06-12 MED ORDER — FOLIC ACID 5 MG/ML IJ SOLN
INTRAMUSCULAR | Status: AC
  Filled 2011-06-12: qty 0.2

## 2011-06-12 MED ORDER — ACETAMINOPHEN 325 MG PO TABS
650.0000 mg | ORAL_TABLET | Freq: Four times a day (QID) | ORAL | Status: DC | PRN
  Administered 2011-06-13 – 2011-06-17 (×14): 650 mg via ORAL
  Filled 2011-06-12 (×14): qty 2

## 2011-06-12 NOTE — ED Notes (Signed)
Pt was here for detox Friday and now she states that she is ready to quit drinking alcohol.

## 2011-06-12 NOTE — ED Notes (Signed)
[facility] protocol started

## 2011-06-12 NOTE — ED Notes (Signed)
Pt here for detox from ETOH. Pt states she left facility from [facility] for detox and had appt to go to [facility] today for rehab in [place]. Pt relapsed when she left Friday and has been drinking since she got out. Pt states she has no where to go.

## 2011-06-12 NOTE — ED Provider Notes (Signed)
Medical screening examination/treatment/procedure(s) were conducted as a shared visit with non-physician practitioner(s) and myself.  I personally evaluated the patient during the encounter.  Pt has drinking problem; has been drinking 24 beers a day. Is experiencing withdrawal symptoms. We'll attempt to admit medically.  Donnetta Hutching, MD 06/12/11 [phone]

## 2011-06-12 NOTE — ED Notes (Signed)
Pt states she wants to drink herself to death. Pt states she has been living in a tent and her family has disowned her. Pt states she had a beer prior to her arrival.

## 2011-06-12 NOTE — H&P (Signed)
Hospital Admission Note Date: 06/12/2011  Patient name: Jacqueline [name] Medical record number: [id number] Date of birth: 07/30/1966 Age: 45 y.o. Gender: female PCP: No primary provider on file.  Attending physician: Christiane Ha  Chief Complaint: Detox  History of Present Illness: Jacqueline [name] is an 45 y.o. female with a history of polysubstance abuse including alcohol, cocaine, opiate analgesics and recurrent admissions for detoxification who presents requesting detox again. She was in detox in [place] for 3 days last week. She was scheduled to go to a 30 day program sometime this week, but she relapsed the day of discharge. She drinks a case of beer a day. She uses whatever drugs she can obtain, including snorting cocaine most recently been taking Percocets. She desires an inpatient treatment program, but she was noted to be hallucinating, tremulous, nauseated and anxious. Her last blood alcohol was 70 and she is needs inpatient treatment for her alcohol withdrawal syndrome. She has received several doses of Ativan without any improvement in her symptoms. She reports that she tends to do better with Librium. She had 25 mg of Librium given recently and feels a little bit better. She was seeing worms and spots on the walls. She was walking over to the walls to try to touch the worms. She denies suicidal or homicidal ideation. She has had no visual hallucinations. She has been calm and cooperative without any combativeness or agitation. She has had multiple attempts at detoxification and rehabilitation. She has been to 12-step programs in the past. She recently broke up with her boyfriend and feels particularly desperate. She has been living with her parents and her mother is understandably angry with her. When asked whether she is serious about quitting drugs and alcohol, she reports "I have no other choice."  Past Medical History  Diagnosis Date  . Peptic ulcer   . Alcohol abuse   .  Anemia   . Benzodiazepine abuse   . Depression   . Anxiety   . Thyroid disease   . Alcoholism   . Narcotic abuse   . Back pain    Meds:  (Not in a hospital admission) Allergies: Nsaids History   Social History  . Marital Status: Single    Spouse Name: N/A    Number of Children: N/A  . Years of Education: N/A   Occupational History  . Not on file.   Social History Main Topics  . Smoking status: Current Everyday Smoker -- 1.0 packs/day for 10 years    Types: Cigarettes  . Smokeless tobacco: Never Used  . Alcohol Use: Yes     patient states  "a lot."  . Drug Use: Yes     benzo  . Sexually Active: Yes    Birth Control/ Protection: Surgical   Other Topics Concern  . Not on file   Social History Narrative  . No narrative on file   Family History  Problem Relation Age of Onset  . Cancer Father   . Cancer Other    Past Surgical History  Procedure Date  . Cholecystectomy   . Abdominal surgery   . Esophagogastroduodenoscopy   . Gastric bypass   . Tubal ligation    Review of Systems: Systems reviewed. As above otherwise negative. Physical Exam: Blood pressure 121/67, pulse 82, temperature 98.7 F (37.1 C), temperature source Oral, resp. rate 21, height 5\' 9"  (1.753 m), weight 79.379 kg (175 lb), last menstrual period 06/02/2011, SpO2 99.00%. BP 121/67  Pulse 82  Temp(Src) 98.7  F (37.1 C) (Oral)  Resp 21  Ht 5\' 9"  (1.753 m)  Wt 79.379 kg (175 lb)  BMI 25.84 kg/m2  SpO2 99%  LMP 06/02/2011  General Appearance:    Alert, cooperative, anxious appearing. Oriented but forgetful.   Head:    Normocephalic, without obvious abnormality, atraumatic  Eyes:    PERRL, conjunctiva/corneas clear, EOM's intact, fundi    benign, both eyes     Nose:   Nares normal, septum midline, mucosa normal, no drainage    or sinus tenderness  Throat:   Lips, mucosa, and tongue normal; teeth and gums normal  Neck:   Supple, symmetrical, trachea midline, no adenopathy;    thyroid:   no enlargement/tenderness/nodules; no carotid   bruit or JVD  Back:     Symmetric, no curvature, ROM normal, no CVA tenderness  Lungs:     Clear to auscultation bilaterally, respirations unlabored  Chest Wall:    No tenderness or deformity   Heart:    Regular rate and rhythm, S1 and S2 normal, no murmur, rub   or gallop  Breast Exam:    No tenderness, masses, or nipple abnormality  Abdomen:     Soft, non-tender, bowel sounds active all four quadrants,    no masses, no organomegaly  Genitalia:   deferred   Rectal:   deferred  Extremities:   Extremities normal, atraumatic, no cyanosis or edema  Pulses:   2+ and symmetric all extremities  Skin:   Skin color, texture, turgor normal, no rashes or lesions  Lymph nodes:   Cervical, supraclavicular, and axillary nodes normal  Neurologic:   CNII-XII intact, normal strength, sensation and reflexes    Throughout. fine tremor present. No asterixis.    Lab results: Basic Metabolic Panel:  Basename 06/12/11 0912  NA 137  K 4.0  CL 100  CO2 27  GLUCOSE 93  BUN 7  CREATININE 0.67  CALCIUM 9.0  MG --  PHOS --   Liver Function Tests: No results found for this basename: AST:2,ALT:2,ALKPHOS:2,BILITOT:2,PROT:2,ALBUMIN:2 in the last 72 hours No results found for this basename: LIPASE:2,AMYLASE:2 in the last 72 hours No results found for this basename: AMMONIA:2 in the last 72 hours CBC:  Basename 06/12/11 0912  WBC 7.0  NEUTROABS 4.4  HGB 11.1*  HCT 35.1*  MCV 85.4  PLT 443*   Cardiac Enzymes: No results found for this basename: CKTOTAL:3,CKMB:3,CKMBINDEX:3,TROPONINI:3 in the last 72 hours BNP: No results found for this basename: POCBNP:3 in the last 72 hours D-Dimer: No results found for this basename: DDIMER:2 in the last 72 hours CBG:  Basename 06/12/11 1220  GLUCAP 88   Hemoglobin A1C: No results found for this basename: HGBA1C in the last 72 hours Fasting Lipid Panel: No results found for this basename:  CHOL,HDL,LDLCALC,TRIG,CHOLHDL,LDLDIRECT in the last 72 hours Thyroid Function Tests: No results found for this basename: TSH,T4TOTAL,FREET4,T3FREE,THYROIDAB in the last 72 hours Anemia Panel: No results found for this basename: VITAMINB12,FOLATE,FERRITIN,TIBC,IRON,RETICCTPCT in the last 72 hours Urine Drug Screen: Positive for benzodiazepines only today. on 06/04/11 was positive for cocaine  Alcohol Level:  Basename 06/12/11 1311 06/12/11 0912  ETH 75* 210*   Imaging results:  No results found.  Assessment & Plan: Active Problems:  Alcohol withdrawal hallucinosis  Polysubstance abuse  Hypothyroidism  Patient will require admission for a high CIWA score. she is currently cooperative. I will continue Librium and give Ativan as needed. Continue  Thiamine, multivitamin, folate.she will benefit from a 30 day rehabilitation program or similar. Social  work will need to follow along.  Continue Synthroid for hypothyroidism and Zoloft for depression. Continue nicotine patch for tobacco abuse.   Rajah Tagliaferro L 06/12/2011, 5:48 PM

## 2011-06-12 NOTE — ED Notes (Signed)
Pt 's sister called again, advised that pt has her number and was told to ask for phone whenever she felt like talking, sister advised that I would tell pt she has called again .

## 2011-06-12 NOTE — ED Notes (Signed)
Pt here for detox/rehab, denies any SI/HI at present time, states that she is starting to feel shaky inside, PA notified, additional orders given, Pt has sitter at bedside,

## 2011-06-12 NOTE — ED Notes (Signed)
MD at bedside. 

## 2011-06-12 NOTE — ED Notes (Signed)
Lunch tray given, pt sister called, number taken given to pt to return call, pt states that she will call her later,

## 2011-06-12 NOTE — ED Notes (Signed)
Report given to Stanton Kidney, RN on 300, advised to wait 20 minutes before bringing pt up.

## 2011-06-12 NOTE — ED Notes (Signed)
Pt called nursing staff into room, c/o being jittery, nervous, nausea noted, hands shaky, ciwa score taken, pt states that the previous medication given did not help, CIWA score of 14, pt given 2mg  ativan per protocol sheet, verified with Raynelle Fanning, [place] as well.

## 2011-06-12 NOTE — ED Notes (Signed)
Hospitalist here to evaluate pt for admission.

## 2011-06-12 NOTE — ED Notes (Signed)
Pt states that the ativan given to her has not helped at all, pt states that she still feels weird

## 2011-06-12 NOTE — ED Provider Notes (Signed)
History     CSN: [id number] Arrival date & time: 06/12/2011  8:20 AM  Chief Complaint  Patient presents with  . Medical Clearance   HPI Comments: Patient presents desiring readmission for detox and rehab placement.  She was treated at [facility] in [place],  Last week from [place] to Thursday,  And then was sent out in anticipation of rehab placement,  But states she was unable to stay sober during this time.  Reports drinking "alot" over the past 4 days,  Last intake of etoh was an hour before arrival today.   Patient is a 45 y.o. female presenting with drug/alcohol assessment. The history is provided by the patient.  Drug / Alcohol Assessment Primary symptoms include weakness and intoxication.  Primary symptoms include no seizures. Suspected agents include alcohol. Associated symptoms include nausea. Pertinent negatives include no fever, no injury and no vomiting. Associated medical issues include addiction treatment.    Past Medical History  Diagnosis Date  . Peptic ulcer   . Alcohol abuse   . Anemia   . Benzodiazepine abuse   . Depression   . Anxiety   . Thyroid disease   . Alcoholism   . Narcotic abuse   . Back pain     Past Surgical History  Procedure Date  . Cholecystectomy   . Abdominal surgery   . Esophagogastroduodenoscopy   . Gastric bypass   . Tubal ligation     Family History  Problem Relation Age of Onset  . Cancer Father   . Cancer Other     History  Substance Use Topics  . Smoking status: Current Everyday Smoker -- 1.0 packs/day for 10 years    Types: Cigarettes  . Smokeless tobacco: Never Used  . Alcohol Use: Yes     patient states  "a lot."    OB History    Grav Para Term Preterm Abortions TAB SAB Ect Mult Living   3 3  3      3       Review of Systems  Constitutional: Negative for fever.  HENT: Negative for congestion, sore throat and neck pain.   Eyes: Negative.   Respiratory: Negative for chest tightness and shortness of breath.     Cardiovascular: Negative for chest pain and palpitations.  Gastrointestinal: Positive for nausea. Negative for vomiting and abdominal pain.  Genitourinary: Negative.   Musculoskeletal: Negative for joint swelling and arthralgias.  Skin: Negative.  Negative for rash and wound.  Neurological: Positive for tremors and weakness. Negative for dizziness, seizures, light-headedness, numbness and headaches.  Hematological: Negative.   Psychiatric/Behavioral: Negative.     Physical Exam  BP 126/67  Pulse 98  Temp(Src) 98.7 F (37.1 C) (Oral)  Resp 21  Ht 5\' 9"  (1.753 m)  Wt 175 lb (79.379 kg)  BMI 25.84 kg/m2  SpO2 98%  LMP 06/02/2011  Physical Exam  Nursing note and vitals reviewed. Constitutional: She is oriented to person, place, and time. She appears well-developed and well-nourished.       Appears uncomfortable,  Tearful.   HENT:  Head: Normocephalic and atraumatic.  Eyes: Conjunctivae are normal. No scleral icterus.  Neck: Normal range of motion. Neck supple.  Cardiovascular: Normal rate, regular rhythm, normal heart sounds and intact distal pulses.   Pulmonary/Chest: Effort normal and breath sounds normal. She has no wheezes.  Abdominal: Soft. Bowel sounds are normal. There is no tenderness. There is no rebound.  Musculoskeletal: Normal range of motion.  Neurological: She is alert and oriented to  person, place, and time. She has normal strength. She displays normal reflexes. No cranial nerve deficit. Gait normal.  Skin: Skin is warm and dry. She is not diaphoretic.  Psychiatric: Her speech is normal. Thought content normal. Her mood appears anxious. She expresses no suicidal plans and no homicidal plans.    ED Course  Procedures  Spoke with Frances Maywood with [facility] - will eval patient once etoh is less than 150 - will need to be below this level before can attempt placement for her for detox and rehab.  At re-exam  With etoh level at 75, patient seeing spots on the walls,   Occasional movement that looks like worms.  Concern for early dt's - will call for medical admission.  Spoke with Dr Lendell Caprice with [facility] - will eval pt for admission.  MDM  etoh abuse with acute dt's.       Candis Musa, PA 06/12/11 1723  Candis Musa, PA 06/12/11 1726

## 2011-06-12 NOTE — ED Notes (Signed)
Pt requesting additional librium or ativan, Raynelle Fanning, [place] notified, no additional orders given, advised pt would need to be admitted.

## 2011-06-13 MED ORDER — CHLORDIAZEPOXIDE HCL 25 MG PO CAPS
50.0000 mg | ORAL_CAPSULE | Freq: Four times a day (QID) | ORAL | Status: DC
  Administered 2011-06-13 – 2011-06-15 (×8): 50 mg via ORAL
  Filled 2011-06-13 (×8): qty 2

## 2011-06-13 MED ORDER — LORAZEPAM 2 MG/ML IJ SOLN
2.0000 mg | INTRAMUSCULAR | Status: DC | PRN
  Administered 2011-06-13 – 2011-06-16 (×14): 2 mg via INTRAVENOUS
  Filled 2011-06-13 (×16): qty 1

## 2011-06-13 MED ORDER — DEXTROSE 5 % IV SOLN
1.0000 g | INTRAVENOUS | Status: DC
  Filled 2011-06-13: qty 1

## 2011-06-13 MED ORDER — CHLORDIAZEPOXIDE HCL 25 MG PO CAPS
25.0000 mg | ORAL_CAPSULE | Freq: Once | ORAL | Status: AC
  Administered 2011-06-13: 25 mg via ORAL
  Filled 2011-06-13: qty 1

## 2011-06-13 MED ORDER — DEXTROSE 5 % IV SOLN
INTRAVENOUS | Status: AC
  Filled 2011-06-13: qty 1

## 2011-06-13 MED ORDER — LORAZEPAM 1 MG PO TABS
2.0000 mg | ORAL_TABLET | ORAL | Status: DC | PRN
  Administered 2011-06-16 – 2011-06-18 (×10): 2 mg via ORAL
  Filled 2011-06-13 (×12): qty 2

## 2011-06-13 MED ORDER — LORAZEPAM 2 MG/ML IJ SOLN
2.0000 mg | Freq: Once | INTRAMUSCULAR | Status: AC
  Administered 2011-06-13: 2 mg via INTRAVENOUS
  Filled 2011-06-13: qty 1

## 2011-06-13 MED ORDER — DEXTROSE 5 % IV SOLN
1.0000 g | INTRAVENOUS | Status: DC
  Administered 2011-06-13 – 2011-06-14 (×2): 1 g via INTRAVENOUS
  Filled 2011-06-13 (×3): qty 1

## 2011-06-13 MED ORDER — SODIUM CHLORIDE 0.9 % IJ SOLN
INTRAMUSCULAR | Status: AC
  Administered 2011-06-13: 10 mL
  Filled 2011-06-13: qty 10

## 2011-06-13 MED ORDER — SODIUM CHLORIDE 0.9 % IJ SOLN
INTRAMUSCULAR | Status: AC
  Administered 2011-06-13: 3 mL
  Filled 2011-06-13: qty 3

## 2011-06-13 NOTE — ED Provider Notes (Signed)
Medical screening examination/treatment/procedure(s) were performed by non-physician practitioner and as supervising physician I was immediately available for consultation/collaboration.  Donnetta Hutching, MD 06/13/11 [phone]

## 2011-06-13 NOTE — Progress Notes (Signed)
Subjective: Per nursing, has been hallucinating spiders on the wall and is very anxious. The patient won't answer most of my questions currently.  Objective: Vital signs in last 24 hours: Filed Vitals:   06/12/11 1236 06/12/11 1757 06/12/11 2036 06/13/11 0533  BP: 121/67 126/67 122/78 104/63  Pulse: 82 83 85 81  Temp:  97.9 F (36.6 C) 98.4 F (36.9 C) 98.7 F (37.1 C)  TempSrc:  Oral Oral Oral  Resp: 21 20 18 18   Height:   5\' 9"  (1.753 m)   Weight:   77.111 kg (170 lb)   SpO2: 99% 100% 95% 98%   Weight change:   Intake/Output Summary (Last 24 hours) at 06/13/11 1126 Last data filed at 06/13/11 0833  Gross per 24 hour  Intake 1441.33 ml  Output      0 ml  Net 1441.33 ml   General: Anxious. Eyes are darting about the room. Patient is holding her knees in her arms and appears afraid Lungs clear to auscultation bilaterally without wheeze rhonchi or rales Cardiovascular regular rate rhythm without murmurs gallops rubs Abdomen soft nontender nondistended Extremities no clubbing cyanosis or edema  Lab Results: Basic Metabolic Panel:  Lab 06/12/11 1610 06/12/11 0912  NA -- 137  K -- 4.0  CL -- 100  CO2 -- 27  GLUCOSE -- 93  BUN -- 7  CREATININE 0.87 0.67  CALCIUM -- 9.0  MG -- --  PHOS -- --   Liver Function Tests: No results found for this basename: AST:2,ALT:2,ALKPHOS:2,BILITOT:2,PROT:2,ALBUMIN:2 in the last 168 hours No results found for this basename: LIPASE:2,AMYLASE:2 in the last 168 hours No results found for this basename: AMMONIA:2 in the last 168 hours CBC:  Lab 06/12/11 2059 06/12/11 0912  WBC 10.1 7.0  NEUTROABS -- 4.4  HGB 10.9* 11.1*  HCT 33.8* 35.1*  MCV 85.8 85.4  PLT 445* 443*   Cardiac Enzymes: No results found for this basename: CKTOTAL:3,CKMB:3,CKMBINDEX:3,TROPONINI:3 in the last 168 hours BNP: No results found for this basename: POCBNP:3 in the last 168 hours D-Dimer: No results found for this basename: DDIMER:2 in the last 168  hours CBG:  Lab 06/12/11 1220  GLUCAP 88   Hemoglobin A1C: No results found for this basename: HGBA1C in the last 168 hours Fasting Lipid Panel: No results found for this basename: CHOL,HDL,LDLCALC,TRIG,CHOLHDL,LDLDIRECT in the last 960 hours Thyroid Function Tests: No results found for this basename: TSH,T4TOTAL,FREET4,T3FREE,THYROIDAB in the last 168 hours Anemia Panel: No results found for this basename: VITAMINB12,FOLATE,FERRITIN,TIBC,IRON,RETICCTPCT in the last 168 hours  Alcohol Level:  Lab 06/12/11 1311 06/12/11 0912  ETH 75* 210*    Micro Results: No results found for this or any previous visit (from the past 240 hour(s)). Studies/Results: No results found.  Scheduled Meds:   . buPROPion  150 mg Oral Daily  . calamine   Topical QID  . cefTRIAXone (ROCEPHIN) IV  1 g Intravenous Q24H  . chlordiazePOXIDE  25 mg Oral Once  . chlordiazePOXIDE  25 mg Oral Once  . chlordiazePOXIDE  50 mg Oral QID  . enoxaparin  40 mg Subcutaneous Q24H  . folic acid  1 mg Oral Daily  . levothyroxine  112 mcg Oral QAC breakfast  . LORazepam  2 mg Intravenous Once  . LORazepam  1 mg Oral Once  . LORazepam  2 mg Oral Once  . nicotine  21 mg Transdermal Once  . nicotine  21 mg Transdermal Daily  . pantoprazole  40 mg Oral BID AC  . sertraline  100 mg Oral Daily  . general admission iv infusion   Intravenous Once  . sodium chloride      . vitamin B-1  100 mg Oral Daily  . DISCONTD: cefTRIAXone (ROCEPHIN) IV  1 g Intravenous Q24H  . DISCONTD: chlordiazePOXIDE  25 mg Oral TID   Continuous Infusions:   . sodium chloride 100 mL/hr at 06/13/11 0833   PRN Meds:.acetaminophen, alum & mag hydroxide-simeth, LORazepam, LORazepam, ondansetron (ZOFRAN) IV, ondansetron, DISCONTD: acetaminophen, DISCONTD: LORazepam, DISCONTD: LORazepam Assessment/Plan: Active Problems:  Alcohol withdrawal hallucinosis  Polysubstance abuse  Hypothyroidism  CIWA score is 17. She appears worse today. I will  increase her lithium and Ativan as needed. Give her a stat dose of IV Ativan and oral lithium. Should she worsen, we may need to monitor her more closely and step down. Currently however her vital signs are stable and she is cooperative. Not ready for placement.   LOS: 1 day   Cameron Schwinn L 06/13/2011, 11:26 AM

## 2011-06-13 NOTE — Progress Notes (Signed)
Notified MD of CIWA scale of 17 with increasing agitation/anxiety and hallucinations.  Orders given. Will continue to monitor pt.

## 2011-06-14 LAB — BASIC METABOLIC PANEL
BUN: 9 mg/dL (ref 6–23)
CO2: 24 mEq/L (ref 19–32)
Calcium: 8.7 mg/dL (ref 8.4–10.5)
Creatinine, Ser: 0.88 mg/dL (ref 0.50–1.10)
GFR calc non Af Amer: >60 mL/min (ref >60)
Glucose, Bld: 104 mg/dL — ABNORMAL HIGH (ref 70–99)

## 2011-06-14 MED ORDER — SODIUM CHLORIDE 0.9 % IJ SOLN
INTRAMUSCULAR | Status: AC
  Administered 2011-06-14: 3 mL
  Filled 2011-06-14: qty 3

## 2011-06-14 MED ORDER — IBUPROFEN 800 MG PO TABS
400.0000 mg | ORAL_TABLET | ORAL | Status: DC | PRN
  Administered 2011-06-14 – 2011-06-15 (×3): 400 mg via ORAL
  Filled 2011-06-14 (×3): qty 1

## 2011-06-14 NOTE — Progress Notes (Signed)
Per nursing, less agitated today.  Subjective: Had sharp left-sided chest pain lasting about 10 minutes twice this morning. She also had palpitations and right-sided neck pain with that. She's had no shortness of breath. She is worried about not having a place to live and about whether she will be able to find a 30 day treatment program.  Objective: Vital signs in last 24 hours: Filed Vitals:   06/13/11 1400 06/13/11 2150 06/14/11 0207 06/14/11 0527  BP: 122/77 108/67 92/53 97/55   Pulse: 79 82 63 74  Temp: 98.2 F (36.8 C) 98.4 F (36.9 C) 98.7 F (37.1 C) 98.3 F (36.8 C)  TempSrc: Oral Oral Oral Oral  Resp: 18 18 18 18   Height:      Weight:      SpO2: 99% 96% 97% 98%   Weight change:   Intake/Output Summary (Last 24 hours) at 06/14/11 1126 Last data filed at 06/14/11 0759  Gross per 24 hour  Intake   2030 ml  Output      0 ml  Net   2030 ml   General: Still anxious, but improved. More communicative and seems to be responding less to internal stimuli. Appropriate. Lungs clear to auscultation bilaterally without wheeze rhonchi or rales Cardiovascular regular rate rhythm without murmurs gallops rubs Abdomen soft nontender nondistended Extremities no clubbing cyanosis or edema no tremor Musculoskeletal: No chest wall tenderness.  Lab Results: Basic Metabolic Panel:  Lab 06/12/11 4098 06/12/11 0912  NA -- 137  K -- 4.0  CL -- 100  CO2 -- 27  GLUCOSE -- 93  BUN -- 7  CREATININE 0.87 0.67  CALCIUM -- 9.0  MG -- --  PHOS -- --   Liver Function Tests: No results found for this basename: AST:2,ALT:2,ALKPHOS:2,BILITOT:2,PROT:2,ALBUMIN:2 in the last 168 hours No results found for this basename: LIPASE:2,AMYLASE:2 in the last 168 hours No results found for this basename: AMMONIA:2 in the last 168 hours CBC:  Lab 06/12/11 2059 06/12/11 0912  WBC 10.1 7.0  NEUTROABS -- 4.4  HGB 10.9* 11.1*  HCT 33.8* 35.1*  MCV 85.8 85.4  PLT 445* 443*   Cardiac Enzymes: No  results found for this basename: CKTOTAL:3,CKMB:3,CKMBINDEX:3,TROPONINI:3 in the last 168 hours BNP: No results found for this basename: POCBNP:3 in the last 168 hours D-Dimer: No results found for this basename: DDIMER:2 in the last 168 hours CBG:  Lab 06/12/11 1220  GLUCAP 88   Hemoglobin A1C: No results found for this basename: HGBA1C in the last 168 hours Fasting Lipid Panel: No results found for this basename: CHOL,HDL,LDLCALC,TRIG,CHOLHDL,LDLDIRECT in the last 119 hours Thyroid Function Tests: No results found for this basename: TSH,T4TOTAL,FREET4,T3FREE,THYROIDAB in the last 168 hours Anemia Panel: No results found for this basename: VITAMINB12,FOLATE,FERRITIN,TIBC,IRON,RETICCTPCT in the last 168 hours  Alcohol Level:  Lab 06/12/11 1311 06/12/11 0912  ETH 75* 210*    Micro Results: No results found for this or any previous visit (from the past 240 hour(s)). Studies/Results: No results found.  Scheduled Meds:    . buPROPion  150 mg Oral Daily  . calamine   Topical QID  . chlordiazePOXIDE  25 mg Oral Once  . chlordiazePOXIDE  50 mg Oral QID  . enoxaparin  40 mg Subcutaneous Q24H  . folic acid  1 mg Oral Daily  . levothyroxine  112 mcg Oral QAC breakfast  . LORazepam  2 mg Intravenous Once  . nicotine  21 mg Transdermal Once  . nicotine  21 mg Transdermal Daily  . pantoprazole  40 mg Oral BID AC  . sertraline  100 mg Oral Daily  . sodium chloride      . sodium chloride      . vitamin B-1  100 mg Oral Daily  . DISCONTD: cefTRIAXone (ROCEPHIN) IV  1 g Intravenous Q24H   Continuous Infusions:    . sodium chloride 50 mL/hr at 06/14/11 0001   PRN Meds:.acetaminophen, alum & mag hydroxide-simeth, LORazepam, LORazepam, ondansetron (ZOFRAN) IV, ondansetron Assessment/Plan: Active Problems:  Alcohol withdrawal hallucinosis  Polysubstance abuse  Hypothyroidism  chest pain Palpitations  She appears improved today but still has elevated CIWA scores. I will  place her on telemetry check a 12-lead EKG and electrolytes. She is not stable for transfer.   LOS: 2 days   Jacqueline [name] 06/14/2011, 11:26 AM

## 2011-06-15 MED ORDER — SULFAMETHOXAZOLE-TMP DS 800-160 MG PO TABS
1.0000 | ORAL_TABLET | Freq: Two times a day (BID) | ORAL | Status: AC
  Administered 2011-06-15 – 2011-06-17 (×6): 1 via ORAL
  Filled 2011-06-15 (×5): qty 1

## 2011-06-15 MED ORDER — POTASSIUM CHLORIDE IN NACL 40-0.9 MEQ/L-% IV SOLN
INTRAVENOUS | Status: AC
  Filled 2011-06-15: qty 1000

## 2011-06-15 MED ORDER — CHLORDIAZEPOXIDE HCL 25 MG PO CAPS
50.0000 mg | ORAL_CAPSULE | Freq: Three times a day (TID) | ORAL | Status: DC
  Administered 2011-06-15 – 2011-06-16 (×5): 50 mg via ORAL
  Filled 2011-06-15 (×4): qty 2

## 2011-06-15 MED ORDER — SODIUM CHLORIDE 0.9 % IV SOLN
INTRAVENOUS | Status: DC
  Administered 2011-06-15 – 2011-06-16 (×2): via INTRAVENOUS
  Filled 2011-06-15 (×7): qty 1000

## 2011-06-15 MED ORDER — SODIUM CHLORIDE 0.9 % IJ SOLN
INTRAMUSCULAR | Status: AC
  Administered 2011-06-15: 01:00:00
  Filled 2011-06-15: qty 3

## 2011-06-15 MED ORDER — PHENAZOPYRIDINE HCL 100 MG PO TABS
200.0000 mg | ORAL_TABLET | Freq: Three times a day (TID) | ORAL | Status: AC
  Administered 2011-06-15 – 2011-06-17 (×6): 200 mg via ORAL
  Filled 2011-06-15 (×6): qty 2

## 2011-06-15 NOTE — Progress Notes (Signed)
Patient's vital signs are stable, her CIWA scores have improved. She is not tremulous. She is alert, oriented and appropriate. She is medically stable to transfer to a treatment facility. I will continue to taper her Librium. She will need the [facility] team to reevaluate today.

## 2011-06-16 DIAGNOSIS — N390 Urinary tract infection, site not specified: Secondary | ICD-10-CM | POA: Diagnosis not present

## 2011-06-16 MED ORDER — CHLORDIAZEPOXIDE HCL 25 MG PO CAPS
50.0000 mg | ORAL_CAPSULE | Freq: Two times a day (BID) | ORAL | Status: AC
  Administered 2011-06-16 – 2011-06-17 (×2): 50 mg via ORAL
  Filled 2011-06-16 (×3): qty 2

## 2011-06-16 MED ORDER — POTASSIUM CHLORIDE IN NACL 40-0.9 MEQ/L-% IV SOLN
INTRAVENOUS | Status: AC
  Filled 2011-06-16: qty 1000

## 2011-06-16 MED ORDER — SODIUM CHLORIDE 0.9 % IJ SOLN
INTRAMUSCULAR | Status: AC
  Administered 2011-06-16: 13:00:00
  Filled 2011-06-16: qty 10

## 2011-06-16 NOTE — Progress Notes (Signed)
Per nursing, stable  Subjective: Denies any hallucinations. Worried about discharge planning. Had dysuria yesterday which was better today after peridium.  Objective: Vital signs in last 24 hours: Filed Vitals:   06/15/11 1100 06/15/11 1400 06/15/11 2200 06/16/11 0618  BP:  99/62 91/49 96/59   Pulse: 71 72 70 74  Temp:  98.2 F (36.8 C) 98.5 F (36.9 C) 98.4 F (36.9 C)  TempSrc:  Oral Oral Oral  Resp:  18 16 16   Height:      Weight:      SpO2:  98% 98% 97%   Weight change:   Intake/Output Summary (Last 24 hours) at 06/16/11 1217 Last data filed at 06/15/11 1755  Gross per 24 hour  Intake    240 ml  Output      0 ml  Net    240 ml   General: Alert oriented appropriate Lungs clear to auscultation bilaterally without wheeze rhonchi or rales Cardiovascular regular rate rhythm without murmurs gallops rubs Abdomen soft nontender nondistended Extremities no clubbing cyanosis or edema no tremor Psychiatric: Intermittently tearful. Manipulative  Lab Results: Basic Metabolic Panel:  Lab 06/14/11 4098 06/12/11 2059 06/12/11 0912  NA 135 -- 137  K 3.5 -- 4.0  CL 100 -- 100  CO2 24 -- 27  GLUCOSE 104* -- 93  BUN 9 -- 7  CREATININE 0.88 0.87 --  CALCIUM 8.7 -- 9.0  MG 2.0 -- --  PHOS -- -- --   Liver Function Tests: No results found for this basename: AST:2,ALT:2,ALKPHOS:2,BILITOT:2,PROT:2,ALBUMIN:2 in the last 168 hours No results found for this basename: LIPASE:2,AMYLASE:2 in the last 168 hours No results found for this basename: AMMONIA:2 in the last 168 hours CBC:  Lab 06/12/11 2059 06/12/11 0912  WBC 10.1 7.0  NEUTROABS -- 4.4  HGB 10.9* 11.1*  HCT 33.8* 35.1*  MCV 85.8 85.4  PLT 445* 443*   Cardiac Enzymes: No results found for this basename: CKTOTAL:3,CKMB:3,CKMBINDEX:3,TROPONINI:3 in the last 168 hours BNP: No results found for this basename: POCBNP:3 in the last 168 hours D-Dimer: No results found for this basename: DDIMER:2 in the last 168  hours CBG:  Lab 06/12/11 1220  GLUCAP 88   Hemoglobin A1C: No results found for this basename: HGBA1C in the last 168 hours Fasting Lipid Panel: No results found for this basename: CHOL,HDL,LDLCALC,TRIG,CHOLHDL,LDLDIRECT in the last 119 hours Thyroid Function Tests: No results found for this basename: TSH,T4TOTAL,FREET4,T3FREE,THYROIDAB in the last 168 hours Anemia Panel: No results found for this basename: VITAMINB12,FOLATE,FERRITIN,TIBC,IRON,RETICCTPCT in the last 168 hours  Alcohol Level:  Lab 06/12/11 1311 06/12/11 0912  ETH 75* 210*    Micro Results: No results found for this or any previous visit (from the past 240 hour(s)). Studies/Results: No results found.  Scheduled Meds:    . buPROPion  150 mg Oral Daily  . calamine   Topical QID  . chlordiazePOXIDE  50 mg Oral TID  . folic acid  1 mg Oral Daily  . levothyroxine  112 mcg Oral QAC breakfast  . nicotine  21 mg Transdermal Daily  . pantoprazole  40 mg Oral BID AC  . phenazopyridine  200 mg Oral TID WC  . sertraline  100 mg Oral Daily  . sulfamethoxazole-trimethoprim  1 tablet Oral Q12H  . vitamin B-1  100 mg Oral Daily   Continuous Infusions:    . sodium chloride 0.9 % 1,000 mL with potassium chloride 40 mEq infusion 100 mL/hr at 06/16/11 0644   PRN Meds:.acetaminophen, alum & mag hydroxide-simeth, LORazepam,  LORazepam, ondansetron (ZOFRAN) IV, ondansetron, DISCONTD: ibuprofen Assessment/Plan: Active Problems:  Alcohol withdrawal hallucinosis  Polysubstance abuse  Hypothyroidism  UTI, symptoms improved on peridium. Complete a three-day course of Bactrim.  Alcohol withdrawal, controlled. I will continue to taper her Librium. Awaiting word on a 30 day treatment program. Social worker now reports that disposition cannot be determined until Monday. If no better on Monday, alternate discharge plans will need to be explored. Patient reports she is homeless.   LOS: 4 days   Jacqueline [name] 06/16/2011,  12:17 PM

## 2011-06-17 ENCOUNTER — Encounter (HOSPITAL_COMMUNITY): Payer: Self-pay | Admitting: Internal Medicine

## 2011-06-17 DIAGNOSIS — D696 Thrombocytopenia, unspecified: Secondary | ICD-10-CM

## 2011-06-17 DIAGNOSIS — D649 Anemia, unspecified: Secondary | ICD-10-CM | POA: Diagnosis present

## 2011-06-17 HISTORY — DX: Thrombocytopenia, unspecified: D69.6

## 2011-06-17 MED ORDER — SULFAMETHOXAZOLE-TMP DS 800-160 MG PO TABS
1.0000 | ORAL_TABLET | Freq: Once | ORAL | Status: AC
  Administered 2011-06-17: 1 via ORAL
  Filled 2011-06-17: qty 1

## 2011-06-17 MED ORDER — POTASSIUM CHLORIDE CRYS ER 20 MEQ PO TBCR
20.0000 meq | EXTENDED_RELEASE_TABLET | Freq: Every day | ORAL | Status: AC
  Administered 2011-06-17: 20 meq via ORAL
  Filled 2011-06-17: qty 1

## 2011-06-17 NOTE — Progress Notes (Signed)
Per nursing, stable  Subjective: She has no complaints. She wants to get better. She continues to want inpatient treatment for her alcoholism.  Objective: Vital signs in last 24 hours: Filed Vitals:   06/16/11 1400 06/16/11 2200 06/17/11 0600 06/17/11 1308  BP: 101/67 104/64 92/43 90/55   Pulse: 66 84 76   Temp: 98.9 F (37.2 C) 99.1 F (37.3 C) 98.3 F (36.8 C)   TempSrc: Oral Axillary Oral Oral  Resp: 18 16 16 17   Height:      Weight:      SpO2: 98% 97% 94% 97%   Weight change:   Intake/Output Summary (Last 24 hours) at 06/17/11 1337 Last data filed at 06/17/11 1300  Gross per 24 hour  Intake   1500 ml  Output      0 ml  Net   1500 ml   General: Alert, oriented, appropriate Lungs clear to auscultation bilaterally without wheeze rhonchi or rales Cardiovascular regular rate rhythm without murmurs gallops rubs Abdomen soft nontender nondistended Extremities no clubbing cyanosis or edema no tremor Psychiatric: Alert and oriented x3. Pleasant affect.  Lab Results: Basic Metabolic Panel:  Lab 06/14/11 1610 06/12/11 2059 06/12/11 0912  NA 135 -- 137  K 3.5 -- 4.0  CL 100 -- 100  CO2 24 -- 27  GLUCOSE 104* -- 93  BUN 9 -- 7  CREATININE 0.88 0.87 --  CALCIUM 8.7 -- 9.0  MG 2.0 -- --  PHOS -- -- --   Liver Function Tests: No results found for this basename: AST:2,ALT:2,ALKPHOS:2,BILITOT:2,PROT:2,ALBUMIN:2 in the last 168 hours No results found for this basename: LIPASE:2,AMYLASE:2 in the last 168 hours No results found for this basename: AMMONIA:2 in the last 168 hours CBC:  Lab 06/12/11 2059 06/12/11 0912  WBC 10.1 7.0  NEUTROABS -- 4.4  HGB 10.9* 11.1*  HCT 33.8* 35.1*  MCV 85.8 85.4  PLT 445* 443*   Cardiac Enzymes: No results found for this basename: CKTOTAL:3,CKMB:3,CKMBINDEX:3,TROPONINI:3 in the last 168 hours BNP: No results found for this basename: POCBNP:3 in the last 168 hours D-Dimer: No results found for this basename: DDIMER:2 in the last 168  hours CBG:  Lab 06/12/11 1220  GLUCAP 88   Hemoglobin A1C: No results found for this basename: HGBA1C in the last 168 hours Fasting Lipid Panel: No results found for this basename: CHOL,HDL,LDLCALC,TRIG,CHOLHDL,LDLDIRECT in the last 960 hours Thyroid Function Tests: No results found for this basename: TSH,T4TOTAL,FREET4,T3FREE,THYROIDAB in the last 168 hours Anemia Panel: No results found for this basename: VITAMINB12,FOLATE,FERRITIN,TIBC,IRON,RETICCTPCT in the last 168 hours  Alcohol Level:  Lab 06/12/11 1311 06/12/11 0912  ETH 75* 210*    Micro Results: No results found for this or any previous visit (from the past 240 hour(s)). Studies/Results: No results found.  Scheduled Meds:    . buPROPion  150 mg Oral Daily  . calamine   Topical QID  . chlordiazePOXIDE  50 mg Oral BID  . folic acid  1 mg Oral Daily  . levothyroxine  112 mcg Oral QAC breakfast  . nicotine  21 mg Transdermal Daily  . pantoprazole  40 mg Oral BID AC  . phenazopyridine  200 mg Oral TID WC  . potassium chloride  20 mEq Oral Daily  . sertraline  100 mg Oral Daily  . sulfamethoxazole-trimethoprim  1 tablet Oral Q12H  . vitamin B-1  100 mg Oral Daily   Continuous Infusions:   PRN Meds:.acetaminophen, alum & mag hydroxide-simeth, LORazepam, LORazepam, ondansetron (ZOFRAN) IV, ondansetron Assessment/Plan: Active Problems:  Alcohol withdrawal hallucinosis  Hypothyroidism  Polysubstance abuse  UTI (urinary tract infection)  Anemia  Thrombocytopenia   UTI, symptoms improved on Pyridium. On a three-day course of Bactrim, started on 06/15/2011.  Alcohol withdrawal, controlled. I will continue to taper her Librium. Awaiting word on a 30 day treatment program. Social worker now reports that disposition cannot be determined until Monday. If no better on Monday, alternate discharge plans will need to be explored. Patient reports she is homeless.  Anemia and thrombocytopenia, probably secondary to  alcohol abuse. She is also a menstruating female. We will order a vitamin B 12 level and ferritin level.   LOS: 5 days   Jacqueline [name] 06/17/2011, 1:37 PM

## 2011-06-17 NOTE — Consult Note (Signed)
Spoke with Pt re: D/C plan.  Pt expressed anxiety about the possibility of being D/Ced to the street rather than a facility.  She is also very concerned about having her benzos D/Ced when she goes to treatment.  Discussed [facility] and [facility] (inpatient SA programs) with client.  She is agreeable to go wherever there is a bed available, but would prefer [facility].  Pt and her mother have been estranged of late, but client believes if she needs a place to stay for a few days between the hospital and inpatient treatment, her mother might let her stay.  CSW agreed to meet with Pt and her mother this afternoon to discuss treatment options and contingency plan.

## 2011-06-18 LAB — CBC
HCT: 33.7 % — ABNORMAL LOW (ref 36.0–46.0)
Hemoglobin: 10.6 g/dL — ABNORMAL LOW (ref 12.0–15.0)
MCH: 27.6 pg (ref 26.0–34.0)
MCHC: 31.5 g/dL (ref 30.0–36.0)
MCV: 87.8 fL (ref 78.0–100.0)
RBC: 3.84 MIL/uL — ABNORMAL LOW (ref 3.87–5.11)

## 2011-06-18 LAB — BASIC METABOLIC PANEL
BUN: 11 mg/dL (ref 6–23)
CO2: 25 mEq/L (ref 19–32)
Calcium: 9.1 mg/dL (ref 8.4–10.5)
Creatinine, Ser: 1.06 mg/dL (ref 0.50–1.10)
Glucose, Bld: 88 mg/dL (ref 70–99)
Sodium: 136 mEq/L (ref 135–145)

## 2011-06-18 MED ORDER — SERTRALINE HCL 100 MG PO TABS: 100.0000 mg | ORAL_TABLET | Freq: Every day | ORAL | Status: DC

## 2011-06-18 MED ORDER — METRONIDAZOLE 500 MG PO TABS: 500.0000 mg | ORAL_TABLET | Freq: Two times a day (BID) | ORAL | Status: AC

## 2011-06-18 MED ORDER — LEVOTHYROXINE SODIUM 112 MCG PO TABS: 112.0000 ug | ORAL_TABLET | Freq: Every day | ORAL | Status: DC

## 2011-06-18 MED ORDER — OMEPRAZOLE 40 MG PO CPDR: 40.0000 mg | DELAYED_RELEASE_CAPSULE | Freq: Two times a day (BID) | ORAL | Status: DC

## 2011-06-18 MED ORDER — THIAMINE HCL 100 MG PO TABS: 100.0000 mg | ORAL_TABLET | Freq: Every day | ORAL | Status: DC

## 2011-06-18 MED ORDER — METRONIDAZOLE 500 MG PO TABS
500.0000 mg | ORAL_TABLET | Freq: Two times a day (BID) | ORAL | Status: DC
  Administered 2011-06-18: 500 mg via ORAL
  Filled 2011-06-18: qty 1

## 2011-06-18 MED ORDER — LORATADINE 10 MG PO TABS: 10.0000 mg | ORAL_TABLET | Freq: Every day | ORAL | Status: DC

## 2011-06-18 MED ORDER — BUPROPION HCL ER (XL) 150 MG PO TB24: 150.0000 mg | ORAL_TABLET | Freq: Every day | ORAL | Status: DC

## 2011-06-18 NOTE — Progress Notes (Signed)
Patient d/c to a rehab facility in [facility] [place] Patient being transported by family Left floor via wheelchair No c/o pain at d/c Verbalized understanding of d/c instructions [name], Kae Heller

## 2011-06-18 NOTE — Consult Note (Signed)
Met with Pt and her mother late yesterday afternoon. Discussed treatment options and relapse prevention plan should Pt need to D/C to somewhere other than a facility until a bed becomes available in an inpt treatment program.  Mother is agreeable to allowing Pt to stay with her for a few days if she intends to go inpt and if she allows her mother to handle any controlled substances she may be prescribed.  Pt agreed to her mother's terms.  CSW will contact [facility] in the AM re: bed availability.

## 2011-06-18 NOTE — Consult Note (Signed)
Pt d/c today by MD. CSW contacted [facility] and bed is available for today.  Authorization received through [facility] and [facility] notified. Pt authorized for 14 days.  CM aware of medication needs. Pt's mother will transport pt.  Pt is concerned about not being on Ativan, but is aware facility will not accept this medication.  CSW provided support due to anxiety.   Karn Cassis

## 2011-06-18 NOTE — Progress Notes (Signed)
Please disregard the options taken [phone]- I accidentally entered data that I thought I was entering into the playground! Nicolasa Ducking [facility] 06/18/2011

## 2011-06-19 DIAGNOSIS — N760 Acute vaginitis: Secondary | ICD-10-CM | POA: Diagnosis not present

## 2011-06-19 DIAGNOSIS — F329 Major depressive disorder, single episode, unspecified: Secondary | ICD-10-CM | POA: Diagnosis present

## 2011-06-19 DIAGNOSIS — F419 Anxiety disorder, unspecified: Secondary | ICD-10-CM | POA: Diagnosis present

## 2011-06-19 NOTE — Discharge Summary (Signed)
Physician Discharge Summary  Jacqueline [name] MRN: [id number] DOB/AGE: [birth year] 45 y.o.  PCP: No primary provider on file.   Admit date: 06/12/2011 Discharge date: 06/18/2011  Discharge Diagnoses:  1. Alcohol withdrawal hallucinosis. 2. Chronic depression with anxiety. 3. Polysubstance abuse, including alcohol, cocaine, and opiate/benzodiazepine abuse. 4. Hypothyroidism. 5. Urinary tract infection. 6. Chronic anemia, likely secondary to alcohol abuse and menstruation. 7. Thrombocytosis, previously stated as thrombocytopenia incorrectly. 8. Chronic low back pain. 9. Probable bacterial vaginosis.   Discharge Medication List as of 06/18/2011 11:26 AM    START taking these medications   Details  metroNIDAZOLE (FLAGYL) 500 MG tablet Take 1 tablet (500 mg total) by mouth 2 (two) times daily., Starting 06/18/2011, Until Thu 06/28/11, Print    vitamin B-1 100 MG tablet Take 1 tablet (100 mg total) by mouth daily., Starting 06/18/2011, Until Tue 06/17/12, Print      CONTINUE these medications which have CHANGED   Details  buPROPion (WELLBUTRIN XL) 150 MG 24 hr tablet Take 1 tablet (150 mg total) by mouth daily., Starting 06/18/2011, Until Discontinued, Print    levothyroxine (SYNTHROID, LEVOTHROID) 112 MCG tablet Take 1 tablet (112 mcg total) by mouth daily., Starting 06/18/2011, Until Discontinued, Print    loratadine (CLARITIN) 10 MG tablet Take 1 tablet (10 mg total) by mouth daily., Starting 06/18/2011, Until Discontinued, Print    omeprazole (PRILOSEC) 40 MG capsule Take 1 capsule (40 mg total) by mouth 2 (two) times daily., Starting 06/18/2011, Until Discontinued, Print    sertraline (ZOLOFT) 100 MG tablet Take 1 tablet (100 mg total) by mouth daily., Starting 06/18/2011, Until Discontinued, Print      STOP taking these medications     clonazePAM (KLONOPIN) 1 MG tablet         Discharge Condition: Stable and improved.  Disposition: Home or Self Care   Consults:  Clinical Child psychotherapist.     Microbiology: No results found for this or any previous visit (from the past 240 hour(s)).   Labs: Results for orders placed during the hospital encounter of 06/12/11 (from the past 48 hour(s))  VITAMIN B12     Status: Normal   Collection Time   06/18/11  4:48 AM      Component Value Range Comment   Vitamin B-12 288  211 - 911 (pg/mL)   FERRITIN     Status: Normal   Collection Time   06/18/11  4:48 AM      Component Value Range Comment   Ferritin 18  10 - 291 (ng/mL)   CBC     Status: Abnormal   Collection Time   06/18/11  4:48 AM      Component Value Range Comment   WBC 6.3  4.0 - 10.5 (K/uL)    RBC 3.84 (*) 3.87 - 5.11 (MIL/uL)    Hemoglobin 10.6 (*) 12.0 - 15.0 (g/dL)    HCT 40.9 (*) 81.1 - 46.0 (%)    MCV 87.8  78.0 - 100.0 (fL)    MCH 27.6  26.0 - 34.0 (pg)    MCHC 31.5  30.0 - 36.0 (g/dL)    RDW 91.4 (*) 78.2 - 15.5 (%)    Platelets 332  150 - 400 (K/uL)   BASIC METABOLIC PANEL     Status: Abnormal   Collection Time   06/18/11  4:48 AM      Component Value Range Comment   Sodium 136  135 - 145 (mEq/L)    Potassium 3.5  3.5 -  5.1 (mEq/L)    Chloride 103  96 - 112 (mEq/L)    CO2 25  19 - 32 (mEq/L)    Glucose, Bld 88  70 - 99 (mg/dL)    BUN 11  6 - 23 (mg/dL)    Creatinine, Ser 4.54  0.50 - 1.10 (mg/dL)    Calcium 9.1  8.4 - 10.5 (mg/dL)    GFR calc non Af Amer 57 (*) >60 (mL/min)    GFR calc Af Amer >60  >60 (mL/min)      HPI : The patient is a 45 year old woman with a past medical history significant for polysubstance abuse including alcohol, cocaine, opiates, and benzodiazepines, who presented to the emergency department on 06/12/2011 with a request for detoxification. She was in detox in [place] for 3 days last week. She was scheduled to go to a 30 day program sometime during the week of admission. However, she relapsed immediately following a 3 day detoxification stent in [place]. She drinks approximately a case of beer daily.  She uses whenever illicit drugs she can obtain. She was witnessed to be hallucinating, tremulous, nauseated, and anxious in the emergency department. Her blood alcohol level was 75. She was given several doses of Ativan without improvement. She stated that she did better on Librium. She was given Librium, and felt better prior to leaving the emergency department. She was admitted for further evaluation and management.  HOSPITAL COURSE: The patient was started on a Librium alcohol withdrawal taper. Ativan was added as needed. Multivitamin therapy, thiamine, and folate were initiated. She was maintained on Synthroid and Zoloft for treatment of hypothyroidism and depression, respectively. IV fluids were started for hydration. A nicotine patch was placed for nicotine replacement therapy. She was advised to stop smoking. Her urine drug screen was noted to be positive only for benzodiazepines. On 06/04/2011, it had been positive for cocaine. Her urinalysis was suggestive of infection. She  did complain of pain with urination. She was given Rocephin initially, and then started on a three-day course of Bactrim. Her dysuria resolved. Later she complained of a greenish vaginal discharge. She said that she had the same discharge for which she was diagnosed with bacterial vaginosis in the past. She was, therefore, started on oral Flagyl for a total of 7 days which will be completed following hospital discharge.  During the first 24 hours of the hospitalization, she was noted to be hallucinating with seeing spiders on the wall. This was reported by the nursing staff. The dosing of Librium was increased. Following the increase, she improved symptomatically. A day or 2 later, it was titrated down.   She was noted to be anemic. She is a menstruating woman. Her ferritin level was low normal at 18. Her platelet count was elevated, indicating thrombocytosis. Her vitamin B 12 level was within normal limits.  Overall, the  patient improved. She became less tremulous. The clinical social worker was consulted. Both the primary medical service and the social workers, along with the patient and family, agreed that the patient could be better served in an inpatient rehabilitation program rather than an outpatient treatment program. The clinical social workers  were instrumental in arranging a transfer from [facility] to  [place]. [facility], however, would not accept her on benzodiazepine medications. The patient was advised of this. After contemplating it, she agreed to be transferred there off of benzodiazepines. Wellbutrin and Zoloft were continued. At the time of hospital discharge, she was clinically improved, hemodynamically stable, and in  no acute distress.  Discharge Exam: Blood pressure 98/58, pulse 72, temperature 97.9 F (36.6 C), temperature source Oral, resp. rate 16, height 5\' 9"  (1.753 m), weight 77.111 kg (170 lb), last menstrual period 06/02/2011, SpO2 100.00%. General: The patient is currently sitting up in bed, in no acute distress. Lungs: Clear to auscultation bilaterally. Heart: S1-S2 with no murmurs rubs or gallops. Abdomen: Positive bowel sounds, nontender, nondistended. Extremities: No pedal edema. Neurologic/psychological. Her affect is pleasant. She is not tremulous. She denies visual hallucinations. Her speech is clear. Cranial nerves II through XII are intact.    Discharge Orders    Future Orders Please Complete By Expires   Diet general      Increase activity slowly         Follow-up Information    Please follow up. (Follow up with your family doctor following discharge from [facility].)          Signed: Arjan Strohm 06/19/2011, 5:05 PM

## 2011-08-22 ENCOUNTER — Encounter (HOSPITAL_COMMUNITY): Payer: Self-pay | Admitting: Emergency Medicine

## 2011-08-22 ENCOUNTER — Emergency Department (HOSPITAL_COMMUNITY)
Admission: EM | Admit: 2011-08-22 | Discharge: 2011-08-22 | Disposition: A | Discharge status: Home / Self Care | Payer: Self-pay | Attending: Emergency Medicine | Admitting: Emergency Medicine

## 2011-08-22 DIAGNOSIS — J069 Acute upper respiratory infection, unspecified: Secondary | ICD-10-CM | POA: Insufficient documentation

## 2011-08-22 DIAGNOSIS — J029 Acute pharyngitis, unspecified: Secondary | ICD-10-CM | POA: Insufficient documentation

## 2011-08-22 LAB — MONONUCLEOSIS SCREEN: Mono Screen: NEGATIVE

## 2011-08-22 MED ORDER — ACETAMINOPHEN 500 MG PO TABS
1000.0000 mg | ORAL_TABLET | Freq: Once | ORAL | Status: DC
  Filled 2011-08-22: qty 1

## 2011-08-22 NOTE — ED Notes (Signed)
Pt reports she has had a sore throat x1 month. Was seen several weeks ago at [facility] and given antibiotics with no relief. Still having throat pain.

## 2011-08-22 NOTE — ED Notes (Signed)
Pt will be called if positive results of mono screen. Pt walked to window.

## 2011-08-22 NOTE — ED Provider Notes (Signed)
History     CSN: [id number] Arrival date & time: 08/22/2011 10:34 AM   First MD Initiated Contact with Patient 08/22/11 1048      Chief Complaint  Patient presents with  . Sore Throat    (Consider location/radiation/quality/duration/timing/severity/associated sxs/prior treatment) HPI  Patient presents to emergency department complaining of a four-week history of gradual onset but persistent nasal drainage, sore throat, left earache, and sinus pressure. Patient notes that a couple of weeks ago she was started on antibiotic but despite finishing it she's had ongoing symptoms without any relief. Patient denies fevers, chills, dizziness, neck stiffness, chest pain, cough, wheezing, abdominal pain, nausea, vomiting, diarrhea. Patient states she's been taking at home Claritin without any additional relief. Patient states her sore throat is worse in the morning but persistent throughout the day. Sore throat is aggravated by swallowing and cold air.  Past Medical History  Diagnosis Date  . Peptic ulcer   . Alcohol abuse   . Anemia   . Benzodiazepine abuse   . Depression   . Anxiety   . Thyroid disease   . Alcoholism   . Narcotic abuse   . Back pain   . Thrombocytopenia 06/17/2011    Past Surgical History  Procedure Date  . Cholecystectomy   . Abdominal surgery   . Esophagogastroduodenoscopy   . Gastric bypass   . Tubal ligation     Family History  Problem Relation Age of Onset  . Cancer Father   . Cancer Other     History  Substance Use Topics  . Smoking status: Former Smoker -- 1.0 packs/day for 10 years    Types: Cigarettes    Quit date: 08/08/2011  . Smokeless tobacco: Never Used  . Alcohol Use: Yes     patient states  "a lot."  Last drink 9/4 before arrival to ED    OB History    Grav Para Term Preterm Abortions TAB SAB Ect Mult Living   3 3  3      3       Review of Systems  All other systems reviewed and are negative.    Allergies  Nsaids  Home  Medications   Current Outpatient Rx  Name Route Sig Dispense Refill  . BUPROPION HCL ER (XL) 150 MG PO TB24 Oral Take 1 tablet (150 mg total) by mouth daily. 30 tablet 1  . LEVOTHYROXINE SODIUM 112 MCG PO TABS Oral Take 1 tablet (112 mcg total) by mouth daily. 30 tablet 1  . LORATADINE 10 MG PO TABS Oral Take 1 tablet (10 mg total) by mouth daily. 30 tablet 1  . OMEPRAZOLE 40 MG PO CPDR Oral Take 1 capsule (40 mg total) by mouth 2 (two) times daily. 60 capsule 1  . SERTRALINE HCL 100 MG PO TABS Oral Take 1 tablet (100 mg total) by mouth daily. 30 tablet 1  . THIAMINE HCL 100 MG PO TABS Oral Take 1 tablet (100 mg total) by mouth daily. 30 tablet 0    BP 122/63  Pulse 66  Temp(Src) 98.8 F (37.1 C) (Oral)  Resp 20  SpO2 100%  LMP 08/15/2011  Physical Exam  Vitals reviewed. Constitutional: She is oriented to person, place, and time. She appears well-developed and well-nourished.  Non-toxic appearance. She does not have a sickly appearance. No distress.  HENT:  Head: Normocephalic and atraumatic.  Right Ear: External ear normal.  Left Ear: External ear normal.  Nose: Nose normal.  Mouth/Throat: No oropharyngeal exudate.  Mild erythema of posterior pharynx and tonsils but no tonsillar exudate or enlargement. Patent airway. Swallowing secretions well. cobbling of posterior pharynx with drainage.  Eyes: Conjunctivae and EOM are normal. Pupils are equal, round, and reactive to light.  Neck: Normal range of motion. Neck supple.  Cardiovascular: Normal rate, regular rhythm and normal heart sounds.  Exam reveals no gallop and no friction rub.   No murmur heard. Pulmonary/Chest: Effort normal and breath sounds normal. No respiratory distress. She has no wheezes. She has no rales. She exhibits no tenderness.  Abdominal: Soft. She exhibits no distension and no mass. There is no tenderness. There is no rebound and no guarding.  Lymphadenopathy:    She has no cervical adenopathy.    Neurological: She is alert and oriented to person, place, and time. She has normal reflexes.  Skin: Skin is warm and dry. No rash noted. She is not diaphoretic.  Psychiatric: She has a normal mood and affect.    ED Course  Procedures (including critical care time)  By mouth Tylenol.  4 weeks of ongoing symptoms to include sore throat without signs or symptoms of exudative pharyngitis unlikely to be ongoing strep or bacterial infection. Will screen for mono. Patient is afebrile and nontoxic appearing. Question viral with allergic component.  Labs Reviewed  MONONUCLEOSIS SCREEN   No results found.   1. Pharyngitis   2. Upper respiratory infection       MDM  pt is afebrile and nontoxic appearing not wish to stay for mono result rather requests we call her if positive. Question viral versus allergic pharyngitis and upper respiratory tract infection.        Jenness Corner, [place] 08/22/11 [phone]

## 2011-08-22 NOTE — ED Provider Notes (Signed)
Medical screening examination/treatment/procedure(s) were performed by non-physician practitioner and as supervising physician I was immediately available for consultation/collaboration.    Ellaree Gear L Shailyn Weyandt, MD 08/22/11 1914 

## 2011-12-17 ENCOUNTER — Other Ambulatory Visit: Payer: Self-pay

## 2011-12-17 ENCOUNTER — Emergency Department (HOSPITAL_COMMUNITY)
Admission: EM | Admit: 2011-12-17 | Discharge: 2011-12-17 | Disposition: A | Discharge status: Other Acute Inpatient Hospital | Payer: Self-pay | Attending: Emergency Medicine | Admitting: Emergency Medicine

## 2011-12-17 ENCOUNTER — Encounter (HOSPITAL_COMMUNITY): Payer: Self-pay | Admitting: *Deleted

## 2011-12-17 DIAGNOSIS — F329 Major depressive disorder, single episode, unspecified: Secondary | ICD-10-CM | POA: Insufficient documentation

## 2011-12-17 DIAGNOSIS — E079 Disorder of thyroid, unspecified: Secondary | ICD-10-CM | POA: Insufficient documentation

## 2011-12-17 DIAGNOSIS — F1010 Alcohol abuse, uncomplicated: Secondary | ICD-10-CM | POA: Insufficient documentation

## 2011-12-17 DIAGNOSIS — F1410 Cocaine abuse, uncomplicated: Secondary | ICD-10-CM | POA: Insufficient documentation

## 2011-12-17 DIAGNOSIS — K279 Peptic ulcer, site unspecified, unspecified as acute or chronic, without hemorrhage or perforation: Secondary | ICD-10-CM | POA: Insufficient documentation

## 2011-12-17 DIAGNOSIS — R569 Unspecified convulsions: Secondary | ICD-10-CM | POA: Insufficient documentation

## 2011-12-17 DIAGNOSIS — F411 Generalized anxiety disorder: Secondary | ICD-10-CM | POA: Insufficient documentation

## 2011-12-17 DIAGNOSIS — Z87891 Personal history of nicotine dependence: Secondary | ICD-10-CM | POA: Insufficient documentation

## 2011-12-17 DIAGNOSIS — Z9889 Other specified postprocedural states: Secondary | ICD-10-CM | POA: Insufficient documentation

## 2011-12-17 DIAGNOSIS — F3289 Other specified depressive episodes: Secondary | ICD-10-CM | POA: Insufficient documentation

## 2011-12-17 LAB — COMPREHENSIVE METABOLIC PANEL
AST: 120 U/L — ABNORMAL HIGH (ref 0–37)
Albumin: 3.8 g/dL (ref 3.5–5.2)
Alkaline Phosphatase: 74 U/L (ref 39–117)
CO2: 22 mEq/L (ref 19–32)
Chloride: 103 mEq/L (ref 96–112)
Potassium: 4 mEq/L (ref 3.5–5.1)
Total Bilirubin: 0.1 mg/dL — ABNORMAL LOW (ref 0.3–1.2)

## 2011-12-17 LAB — CBC
HCT: 34 % — ABNORMAL LOW (ref 36.0–46.0)
Platelets: 457 10*3/uL — ABNORMAL HIGH (ref 150–400)
RBC: 4.1 MIL/uL (ref 3.87–5.11)
RDW: 16.4 % — ABNORMAL HIGH (ref 11.5–15.5)
WBC: 7 10*3/uL (ref 4.0–10.5)

## 2011-12-17 LAB — RAPID URINE DRUG SCREEN, HOSP PERFORMED
Amphetamines: NOT DETECTED
Barbiturates: NOT DETECTED
Benzodiazepines: NOT DETECTED
Cocaine: POSITIVE — AB
Tetrahydrocannabinol: NOT DETECTED

## 2011-12-17 MED ORDER — CHLORDIAZEPOXIDE HCL 25 MG PO CAPS: 25.0000 mg | ORAL_CAPSULE | Freq: Three times a day (TID) | ORAL | Status: DC

## 2011-12-17 MED ORDER — CHLORDIAZEPOXIDE HCL 25 MG PO CAPS: 25.0000 mg | ORAL_CAPSULE | Freq: Four times a day (QID) | ORAL | Status: DC | PRN

## 2011-12-17 MED ORDER — ZOLPIDEM TARTRATE 5 MG PO TABS: 5.0000 mg | ORAL_TABLET | Freq: Every evening | ORAL | Status: DC | PRN

## 2011-12-17 MED ORDER — PANTOPRAZOLE SODIUM 40 MG IV SOLR
40.0000 mg | Freq: Once | INTRAVENOUS | Status: AC
  Administered 2011-12-17: 40 mg via INTRAVENOUS
  Filled 2011-12-17: qty 40

## 2011-12-17 MED ORDER — VITAMIN B-1 100 MG PO TABS: 100.0000 mg | ORAL_TABLET | Freq: Every day | ORAL | Status: DC

## 2011-12-17 MED ORDER — ACETAMINOPHEN 325 MG PO TABS: 650.0000 mg | ORAL_TABLET | ORAL | Status: DC | PRN

## 2011-12-17 MED ORDER — THIAMINE HCL 100 MG/ML IJ SOLN
100.0000 mg | Freq: Once | INTRAMUSCULAR | Status: AC
  Administered 2011-12-17: 100 mg via INTRAMUSCULAR
  Filled 2011-12-17: qty 2

## 2011-12-17 MED ORDER — NICOTINE 21 MG/24HR TD PT24
21.0000 mg | MEDICATED_PATCH | Freq: Once | TRANSDERMAL | Status: DC
  Administered 2011-12-17: 21 mg via TRANSDERMAL
  Filled 2011-12-17: qty 1

## 2011-12-17 MED ORDER — ADULT MULTIVITAMIN W/MINERALS CH
1.0000 | ORAL_TABLET | Freq: Every day | ORAL | Status: DC
  Filled 2011-12-17: qty 1

## 2011-12-17 MED ORDER — LORAZEPAM 2 MG/ML IJ SOLN
1.0000 mg | Freq: Once | INTRAMUSCULAR | Status: AC
  Administered 2011-12-17: 1 mg via INTRAVENOUS
  Filled 2011-12-17: qty 1

## 2011-12-17 MED ORDER — LORAZEPAM 1 MG PO TABS
1.0000 mg | ORAL_TABLET | Freq: Once | ORAL | Status: AC
  Administered 2011-12-17: 1 mg via ORAL
  Filled 2011-12-17: qty 1

## 2011-12-17 MED ORDER — ONDANSETRON HCL 4 MG PO TABS: 4.0000 mg | ORAL_TABLET | Freq: Three times a day (TID) | ORAL | Status: DC | PRN

## 2011-12-17 MED ORDER — CHLORDIAZEPOXIDE HCL 25 MG PO CAPS
50.0000 mg | ORAL_CAPSULE | Freq: Once | ORAL | Status: AC
  Administered 2011-12-17: 50 mg via ORAL
  Filled 2011-12-17: qty 2

## 2011-12-17 MED ORDER — NICOTINE 21 MG/24HR TD PT24
21.0000 mg | MEDICATED_PATCH | Freq: Every day | TRANSDERMAL | Status: DC
  Administered 2011-12-17: 21 mg via TRANSDERMAL

## 2011-12-17 MED ORDER — LORAZEPAM 1 MG PO TABS
1.0000 mg | ORAL_TABLET | Freq: Three times a day (TID) | ORAL | Status: DC | PRN
  Administered 2011-12-17: 1 mg via ORAL
  Filled 2011-12-17: qty 1

## 2011-12-17 NOTE — ED Notes (Signed)
Requesting more anxiety medication  md aware no new orders

## 2011-12-17 NOTE — [facility] Counselor (Signed)
PT WAS ACCEPTED BY DR. Audry Pili TO [phone] & WILL BE TRANSPORTED BY [facility]. PT HAS SIGNED ALL SUPPORT PAPER WORK & IS ABLE TO CONTRACT FOR SAFETY.

## 2011-12-17 NOTE — ED Provider Notes (Signed)
History     CSN: [id number]  Arrival date & time 12/17/11  1109   First MD Initiated Contact with Patient 12/17/11 1302      Chief Complaint  Patient presents with  . Medical Clearance    (Consider location/radiation/quality/duration/timing/severity/associated sxs/prior treatment) HPI  Jacqueline [name] is a 46 y.o. female who presents to the Emergency Department requesting detox from alcohol abuse. Her last detox was 3 months ago at [facility] followed by halfway house in [facility]. She states she drinks beer and wine and drinks daily. She has been drinking today. She advised that when she comes off of alcohol she does have symptoms of DTs with hallucinations and has had a seizure in the past. She is not currently c/o withdrawal symptoms.  Past Medical History  Diagnosis Date  . Peptic ulcer   . Alcohol abuse   . Anemia   . Benzodiazepine abuse   . Depression   . Anxiety   . Thyroid disease   . Alcoholism   . Narcotic abuse   . Back pain   . Thrombocytopenia 06/17/2011    Past Surgical History  Procedure Date  . Cholecystectomy   . Abdominal surgery   . Esophagogastroduodenoscopy   . Gastric bypass   . Tubal ligation     Family History  Problem Relation Age of Onset  . Cancer Father   . Cancer Other     History  Substance Use Topics  . Smoking status: Former Smoker -- 1.0 packs/day for 10 years    Types: Cigarettes    Quit date: 08/08/2011  . Smokeless tobacco: Never Used  . Alcohol Use: Yes     patient states  "a lot."  Last drink 9/4 before arrival to ED    OB History    Grav Para Term Preterm Abortions TAB SAB Ect Mult Living   3 3  3      3       Review of Systems A 10 review of systems reviewed and are negative for acute change except as noted in the HPI. Allergies  Nsaids  Home Medications   Current Outpatient Rx  Name Route Sig Dispense Refill  . BUPROPION HCL ER (XL) 150 MG PO TB24 Oral Take 300 mg by mouth daily.      Marland Kitchen CLONAZEPAM 1 MG PO  TABS Oral Take 1 mg by mouth 2 (two) times daily.      Marland Kitchen LEVOTHYROXINE SODIUM 112 MCG PO TABS Oral Take 1 tablet (112 mcg total) by mouth daily. 30 tablet 1  . LORATADINE 10 MG PO TABS Oral Take 1 tablet (10 mg total) by mouth daily. 30 tablet 1  . NICOTINE 21 MG/24HR TD PT24 Transdermal Place 1 patch onto the skin daily.      Marland Kitchen OMEPRAZOLE 40 MG PO CPDR Oral Take 40 mg by mouth daily.      . SERTRALINE HCL 100 MG PO TABS Oral Take 150 mg by mouth daily.      . THIAMINE HCL 100 MG PO TABS Oral Take 1 tablet (100 mg total) by mouth daily. 30 tablet 0    BP 116/64  Pulse 104  Temp(Src) 98 F (36.7 C) (Oral)  Resp 18  Ht 5\' 9"  (1.753 m)  Wt 160 lb (72.576 kg)  BMI 23.63 kg/m2  SpO2 98%  LMP 12/02/2011  Physical Exam Physical examination:  Nursing notes reviewed; Vital signs and O2 SAT reviewed;  Constitutional: Smells strongly of alcoholWell developed, Well nourished, Well hydrated,  In no acute distress; Head:  Normocephalic, atraumatic; Eyes: EOMI, PERRL, No scleral icterus; ENMT: Mouth and pharynx normal, Mucous membranes moist; Neck: Supple, Full range of motion, No lymphadenopathy; Cardiovascular: Regular rate and rhythm, No murmur, rub, or gallop; Respiratory: Breath sounds clear & equal bilaterally, No rales, rhonchi, wheezes, or rub, Normal respiratory effort/excursion; Chest: Nontender, Movement normal; Abdomen: Soft, Nontender, Nondistended, Normal bowel sounds; Genitourinary: No CVA tenderness; Extremities: Pulses normal, No tenderness, No edema, No calf edema or asymmetry.; Neuro: AA&Ox3, Major CN grossly intact.  No gross focal motor or sensory deficits in extremities.; Skin: Color normal, Warm, Dry  ED Course  Procedures (including critical care time) Results for orders placed during the hospital encounter of 12/17/11  CBC      Component Value Range   WBC 7.0  4.0 - 10.5 (K/uL)   RBC 4.10  3.87 - 5.11 (MIL/uL)   Hemoglobin 10.4 (*) 12.0 - 15.0 (g/dL)   HCT 78.2 (*) 95.6 - 46.0  (%)   MCV 82.9  78.0 - 100.0 (fL)   MCH 25.4 (*) 26.0 - 34.0 (pg)   MCHC 30.6  30.0 - 36.0 (g/dL)   RDW 21.3 (*) 08.6 - 15.5 (%)   Platelets 457 (*) 150 - 400 (K/uL)  COMPREHENSIVE METABOLIC PANEL      Component Value Range   Sodium 137  135 - 145 (mEq/L)   Potassium 4.0  3.5 - 5.1 (mEq/L)   Chloride 103  96 - 112 (mEq/L)   CO2 22  19 - 32 (mEq/L)   Glucose, Bld 81  70 - 99 (mg/dL)   BUN 10  6 - 23 (mg/dL)   Creatinine, Ser 5.78  0.50 - 1.10 (mg/dL)   Calcium 8.7  8.4 - 46.9 (mg/dL)   Total Protein 7.4  6.0 - 8.3 (g/dL)   Albumin 3.8  3.5 - 5.2 (g/dL)   AST 629 (*) 0 - 37 (U/L)   ALT 90 (*) 0 - 35 (U/L)   Alkaline Phosphatase 74  39 - 117 (U/L)   Total Bilirubin 0.1 (*) 0.3 - 1.2 (mg/dL)   GFR calc non Af Amer 83 (*) >90 (mL/min)   GFR calc Af Amer >90  >90 (mL/min)  ETHANOL      Component Value Range   Alcohol, Ethyl (B) 329 (*) 0 - 11 (mg/dL)  ACETAMINOPHEN LEVEL      Component Value Range   Acetaminophen (Tylenol), Serum <15.0  10 - 30 (ug/mL)  PREGNANCY, URINE      Component Value Range   Preg Test, Ur NEGATIVE  NEGATIVE   URINE RAPID DRUG SCREEN (HOSP PERFORMED)      Component Value Range   Opiates NONE DETECTED  NONE DETECTED    Cocaine POSITIVE (*) NONE DETECTED    Benzodiazepines NONE DETECTED  NONE DETECTED    Amphetamines NONE DETECTED  NONE DETECTED    Tetrahydrocannabinol NONE DETECTED  NONE DETECTED    Barbiturates NONE DETECTED  NONE DETECTED   ETHANOL      Component Value Range   Alcohol, Ethyl (B) 70 (*) 0 - 11 (mg/dL)      Date: 52/84/1324  1510  Rate: 89  Rhythm: normal sinus rhythm  QRS Axis: normal  Intervals: normal  ST/T Wave abnormalities: normal  Conduction Disutrbances: none  Narrative Interpretation: unremarkable, no change c/w 06/14/11    1430 Spoke with Frances Maywood, ACT. He will see/evaluate patient. 1955 Advised by Samson Frederic  There are no available beds at this time. This patient  will have to go to [facility] due to h/o seizure. She will be  seen/assessed again tomorrow. 2100 Patient want to leave AMA. Spoke with her re leaving. She wants to leave because she does not like the feeling of being nervous. She has had two doses of Ativan with continued anxiety . CIWA score is <10. Will initiate Librium protocol.  MDM  Patient with h/o alcohol abuse, previous detox, here for detox. Medically cleared for psychiatric evaluation. Seen and evaluated by ACT.Her ETOH level is now below 100. Psych holding orders completed. Librium protocol initiated.    MDM Reviewed: nursing note and vitals Interpretation: labs and ECG           Nicoletta Dress. Colon Branch, MD 12/19/11 1815

## 2011-12-17 NOTE — ED Notes (Signed)
Wants help with alcohol abuse.  Drank etoh today.  Has been drinking vanilla extract.

## 2011-12-17 NOTE — ED Notes (Signed)
Pt requesting to leave AMA. Dr. Colon Branch aware and in room to discuss with pt

## 2011-12-17 NOTE — BH Assessment (Signed)
Assessment Note   Jacqueline [name] is an 46 y.o. female. The patient comes to the ED seeking detox from alcohol. She began using alcohol by age 14. She states that it did not become a problem until around age  66. She admits to drinking wine on a daily bases for several months. She says she  Drinks 3-4 liters of wine daily. She admits to drinking today, her blood alcohol level is 329.  She is also using cocaine daily. She says she uses about $20 worth daily.  She does not remember how old she was when she started using. Her last use was 12/16/11.  She denies any SI or HI. She does admit to 2 attempts in the past. She has had multiple  admissions to treatment in the past, her last 2 were 8/12 at [facility], and [facility] 8/12. She did have a seizure while going through withdrawal in 2006. She is very restless and says she is nauseated. She adds that the light is bright and that she has a headache.   Axis I:  Alcohol Dependence;Cocaine Abuse;Substrance Induced mood disorder Axis II: Deferred  Axis III:  Past Medical History  Diagnosis Date  . Peptic ulcer   . Alcohol abuse   . Anemia   . Benzodiazepine abuse   . Depression   . Anxiety   . Thyroid disease   . Alcoholism   . Narcotic abuse   . Back pain   . Thrombocytopenia 06/17/2011   Axis IV: economic problems, housing problems, occupational problems, other psychosocial or environmental problems, problems related to social environment, problems with access to health care services and problems with primary support group Axis V: 21-30 behavior considerably influenced by delusions or hallucinations OR serious impairment in judgment, communication OR inability to function in almost all areas  Past Medical History:  Past Medical History  Diagnosis Date  . Peptic ulcer   . Alcohol abuse   . Anemia   . Benzodiazepine abuse   . Depression   . Anxiety   . Thyroid disease   . Alcoholism   . Narcotic abuse   . Back pain   . Thrombocytopenia 06/17/2011     Past Surgical History  Procedure Date  . Cholecystectomy   . Abdominal surgery   . Esophagogastroduodenoscopy   . Gastric bypass   . Tubal ligation     Family History:  Family History  Problem Relation Age of Onset  . Cancer Father   . Cancer Other     Social History:  reports that she quit smoking about 4 months ago. Her smoking use included Cigarettes. She has a 10 pack-year smoking history. She has never used smokeless tobacco. She reports that she drinks alcohol. She reports that she uses illicit drugs (Oxycodone and Cocaine).  Additional Social History:    Allergies:  Allergies  Allergen Reactions  . Nsaids Other (See Comments)    G.I. Bleed    Home Medications:  No current facility-administered medications on file as of 12/17/2011.   Medications Prior to Admission  Medication Sig Dispense Refill  . buPROPion (WELLBUTRIN XL) 150 MG 24 hr tablet Take 300 mg by mouth daily.        . clonazePAM (KLONOPIN) 1 MG tablet Take 1 mg by mouth 2 (two) times daily.        Marland Kitchen levothyroxine (SYNTHROID, LEVOTHROID) 112 MCG tablet Take 1 tablet (112 mcg total) by mouth daily.  30 tablet  1  . loratadine (CLARITIN) 10 MG tablet  Take 1 tablet (10 mg total) by mouth daily.  30 tablet  1  . nicotine (NICODERM CQ - DOSED IN MG/24 HOURS) 21 mg/24hr patch Place 1 patch onto the skin daily.        Marland Kitchen omeprazole (PRILOSEC) 40 MG capsule Take 40 mg by mouth daily.        . sertraline (ZOLOFT) 100 MG tablet Take 150 mg by mouth daily.        . vitamin B-1 100 MG tablet Take 1 tablet (100 mg total) by mouth daily.  30 tablet  0    OB/GYN Status:  Patient's last menstrual period was 12/02/2011.  General Assessment Data Location of Assessment: [facility] Assessment: Yes Living Arrangements: Parent;Family members (mother and daughter ) Can pt return to current living arrangement?: Yes Admission Status: Voluntary Is patient capable of signing voluntary admission?: Yes Transfer from: Acute  Hospital Referral Source: MD  Education Status Is patient currently in school?: No  Risk to self Suicidal Ideation: No Suicidal Intent: No Is patient at risk for suicide?: No Suicidal Plan?: No Access to Means: No What has been your use of drugs/alcohol within the last 12 months?: etoh and cocaine Previous Attempts/Gestures: Yes How many times?: 2  Other Self Harm Risks: denjies Triggers for Past Attempts: Unpredictable Intentional Self Injurious Behavior: None Family Suicide History: No Recent stressful life event(s): Job Loss;Financial Problems;Other (Comment) (indignet) Persecutory voices/beliefs?: No Depression: Yes Depression Symptoms: Tearfulness;Isolating;Guilt;Loss of interest in usual pleasures;Feeling worthless/self pity Substance abuse history and/or treatment for substance abuse?: Yes Suicide prevention information given to non-admitted patients: Not applicable  Risk to Others Homicidal Ideation: No Thoughts of Harm to Others: No Current Homicidal Intent: No Current Homicidal Plan: No Access to Homicidal Means: No History of harm to others?: No Assessment of Violence: None Noted Does patient have access to weapons?: No Criminal Charges Pending?: No Does patient have a court date: No  Psychosis Hallucinations: None noted Delusions: None noted  Mental Status Report Appear/Hygiene: Disheveled Eye Contact: Poor Motor Activity: Restlessness Speech: Rapid Level of Consciousness: Alert Mood: Depressed;Guilty;Worthless, low self-esteem Affect: Blunted;Fearful;Silly Anxiety Level: Moderate Thought Processes: Coherent;Circumstantial Judgement: Impaired Orientation: Person;Place;Time Obsessive Compulsive Thoughts/Behaviors: Minimal  Cognitive Functioning Concentration: Decreased Memory: Recent Impaired;Remote Impaired IQ: Average Insight: Poor Impulse Control: Poor Appetite: Fair Sleep: No Change Vegetative Symptoms: Decreased grooming  Prior  Inpatient Therapy Prior Inpatient Therapy: Yes Prior Therapy Dates: 2012 Prior Therapy Facilty/Provider(s): [facility] Reason for Treatment: de3tox  Prior Outpatient Therapy Prior Outpatient Therapy: No            Values / Beliefs Cultural Requests During Hospitalization: None Spiritual Requests During Hospitalization: None        Additional Information 1:1 In Past 12 Months?: No CIRT Risk: No Elopement Risk: No Does patient have medical clearance?: No     Disposition: Patient is not medically clear as her blood alcohol level is 329. She will be referred to detox when this level is below 100. Also informed Dr. Colon Branch that the patient may be beginning to withdraw.    Disposition Disposition of Patient: Other dispositions Other disposition(s): Other (Comment) (remain in ED until medically clear)  On Site Evaluation by:   Reviewed with Physician:     Jearld Pies 12/17/2011 2:14 PM

## 2011-12-18 ENCOUNTER — Inpatient Hospital Stay (HOSPITAL_COMMUNITY)
Admission: AD | Admit: 2011-12-18 | Discharge: 2011-12-21 | DRG: 897 | Disposition: A | Discharge status: Home / Self Care | Payer: 59 | Source: Ambulatory Visit | Attending: Psychiatry | Admitting: Psychiatry

## 2011-12-18 ENCOUNTER — Encounter (HOSPITAL_COMMUNITY): Payer: Self-pay | Admitting: *Deleted

## 2011-12-18 DIAGNOSIS — F411 Generalized anxiety disorder: Secondary | ICD-10-CM

## 2011-12-18 DIAGNOSIS — R748 Abnormal levels of other serum enzymes: Secondary | ICD-10-CM | POA: Diagnosis present

## 2011-12-18 DIAGNOSIS — F10229 Alcohol dependence with intoxication, unspecified: Secondary | ICD-10-CM | POA: Diagnosis present

## 2011-12-18 DIAGNOSIS — E039 Hypothyroidism, unspecified: Secondary | ICD-10-CM | POA: Diagnosis present

## 2011-12-18 DIAGNOSIS — F1910 Other psychoactive substance abuse, uncomplicated: Secondary | ICD-10-CM

## 2011-12-18 DIAGNOSIS — F419 Anxiety disorder, unspecified: Secondary | ICD-10-CM

## 2011-12-18 DIAGNOSIS — A499 Bacterial infection, unspecified: Secondary | ICD-10-CM

## 2011-12-18 DIAGNOSIS — F1020 Alcohol dependence, uncomplicated: Principal | ICD-10-CM

## 2011-12-18 DIAGNOSIS — M549 Dorsalgia, unspecified: Secondary | ICD-10-CM

## 2011-12-18 DIAGNOSIS — F3289 Other specified depressive episodes: Secondary | ICD-10-CM

## 2011-12-18 DIAGNOSIS — Z560 Unemployment, unspecified: Secondary | ICD-10-CM

## 2011-12-18 DIAGNOSIS — N760 Acute vaginitis: Secondary | ICD-10-CM

## 2011-12-18 DIAGNOSIS — F1410 Cocaine abuse, uncomplicated: Secondary | ICD-10-CM | POA: Diagnosis present

## 2011-12-18 DIAGNOSIS — D696 Thrombocytopenia, unspecified: Secondary | ICD-10-CM

## 2011-12-18 DIAGNOSIS — B9689 Other specified bacterial agents as the cause of diseases classified elsewhere: Secondary | ICD-10-CM

## 2011-12-18 DIAGNOSIS — D649 Anemia, unspecified: Secondary | ICD-10-CM | POA: Diagnosis present

## 2011-12-18 DIAGNOSIS — F329 Major depressive disorder, single episode, unspecified: Secondary | ICD-10-CM

## 2011-12-18 DIAGNOSIS — F10232 Alcohol dependence with withdrawal with perceptual disturbance: Secondary | ICD-10-CM

## 2011-12-18 DIAGNOSIS — N390 Urinary tract infection, site not specified: Secondary | ICD-10-CM

## 2011-12-18 DIAGNOSIS — K279 Peptic ulcer, site unspecified, unspecified as acute or chronic, without hemorrhage or perforation: Secondary | ICD-10-CM | POA: Diagnosis present

## 2011-12-18 MED ORDER — ONDANSETRON 4 MG PO TBDP: 4.0000 mg | ORAL_TABLET | Freq: Four times a day (QID) | ORAL | Status: DC | PRN

## 2011-12-18 MED ORDER — CHLORDIAZEPOXIDE HCL 25 MG PO CAPS: 50.0000 mg | ORAL_CAPSULE | Freq: Four times a day (QID) | ORAL | Status: DC

## 2011-12-18 MED ORDER — ALUM & MAG HYDROXIDE-SIMETH 200-200-20 MG/5ML PO SUSP: 30.0000 mL | ORAL | Status: DC | PRN

## 2011-12-18 MED ORDER — LORATADINE 10 MG PO TABS
10.0000 mg | ORAL_TABLET | Freq: Every day | ORAL | Status: DC
  Administered 2011-12-18 – 2011-12-21 (×4): 10 mg via ORAL
  Filled 2011-12-18 (×6): qty 1

## 2011-12-18 MED ORDER — VITAMIN B-1 100 MG PO TABS
100.0000 mg | ORAL_TABLET | Freq: Every day | ORAL | Status: DC
  Administered 2011-12-18: 100 mg via ORAL
  Filled 2011-12-18 (×2): qty 1

## 2011-12-18 MED ORDER — CHLORDIAZEPOXIDE HCL 25 MG PO CAPS: 25.0000 mg | ORAL_CAPSULE | ORAL | Status: DC

## 2011-12-18 MED ORDER — MAGNESIUM HYDROXIDE 400 MG/5ML PO SUSP: 30.0000 mL | Freq: Every day | ORAL | Status: DC | PRN

## 2011-12-18 MED ORDER — CHLORDIAZEPOXIDE HCL 25 MG PO CAPS: 50.0000 mg | ORAL_CAPSULE | Freq: Every day | ORAL | Status: DC

## 2011-12-18 MED ORDER — LEVOTHYROXINE SODIUM 112 MCG PO TABS
112.0000 ug | ORAL_TABLET | Freq: Every day | ORAL | Status: DC
  Administered 2011-12-18 – 2011-12-19 (×2): 112 ug via ORAL
  Filled 2011-12-18 (×3): qty 1

## 2011-12-18 MED ORDER — CHLORDIAZEPOXIDE HCL 25 MG PO CAPS
50.0000 mg | ORAL_CAPSULE | Freq: Four times a day (QID) | ORAL | Status: AC
  Administered 2011-12-18 – 2011-12-19 (×6): 50 mg via ORAL
  Filled 2011-12-18 (×4): qty 2
  Filled 2011-12-18: qty 1
  Filled 2011-12-18: qty 2

## 2011-12-18 MED ORDER — CHLORDIAZEPOXIDE HCL 25 MG PO CAPS: 25.0000 mg | ORAL_CAPSULE | Freq: Four times a day (QID) | ORAL | Status: DC

## 2011-12-18 MED ORDER — ADULT MULTIVITAMIN W/MINERALS CH
1.0000 | ORAL_TABLET | Freq: Every day | ORAL | Status: DC
  Filled 2011-12-18 (×3): qty 1

## 2011-12-18 MED ORDER — LOPERAMIDE HCL 2 MG PO CAPS: 2.0000 mg | ORAL_CAPSULE | ORAL | Status: DC | PRN

## 2011-12-18 MED ORDER — NICOTINE 14 MG/24HR TD PT24
14.0000 mg | MEDICATED_PATCH | Freq: Every day | TRANSDERMAL | Status: DC
  Administered 2011-12-18 – 2011-12-19 (×2): 14 mg via TRANSDERMAL
  Filled 2011-12-18 (×5): qty 1

## 2011-12-18 MED ORDER — TRAZODONE HCL 100 MG PO TABS
100.0000 mg | ORAL_TABLET | Freq: Every evening | ORAL | Status: DC | PRN
  Filled 2011-12-18: qty 1

## 2011-12-18 MED ORDER — CHLORDIAZEPOXIDE HCL 25 MG PO CAPS
50.0000 mg | ORAL_CAPSULE | Freq: Three times a day (TID) | ORAL | Status: AC
  Administered 2011-12-20 (×3): 50 mg via ORAL
  Filled 2011-12-18: qty 1
  Filled 2011-12-18: qty 2
  Filled 2011-12-18: qty 1
  Filled 2011-12-18: qty 2

## 2011-12-18 MED ORDER — CHLORDIAZEPOXIDE HCL 25 MG PO CAPS: 25.0000 mg | ORAL_CAPSULE | Freq: Every day | ORAL | Status: DC

## 2011-12-18 MED ORDER — CHLORDIAZEPOXIDE HCL 25 MG PO CAPS
50.0000 mg | ORAL_CAPSULE | ORAL | Status: DC
  Administered 2011-12-21: 50 mg via ORAL
  Filled 2011-12-18: qty 1
  Filled 2011-12-18: qty 2

## 2011-12-18 MED ORDER — CHLORDIAZEPOXIDE HCL 25 MG PO CAPS
25.0000 mg | ORAL_CAPSULE | Freq: Four times a day (QID) | ORAL | Status: DC | PRN
  Administered 2011-12-18: 25 mg via ORAL

## 2011-12-18 MED ORDER — VITAMIN B-1 100 MG PO TABS
100.0000 mg | ORAL_TABLET | Freq: Every day | ORAL | Status: DC
  Filled 2011-12-18: qty 1

## 2011-12-18 MED ORDER — CHLORDIAZEPOXIDE HCL 25 MG PO CAPS: 25.0000 mg | ORAL_CAPSULE | Freq: Three times a day (TID) | ORAL | Status: DC

## 2011-12-18 MED ORDER — HYDROXYZINE HCL 25 MG PO TABS
25.0000 mg | ORAL_TABLET | Freq: Four times a day (QID) | ORAL | Status: DC | PRN
  Administered 2011-12-18 (×2): 25 mg via ORAL

## 2011-12-18 MED ORDER — THIAMINE HCL 100 MG/ML IJ SOLN: 100.0000 mg | Freq: Once | INTRAMUSCULAR | Status: DC

## 2011-12-18 MED ORDER — CHLORDIAZEPOXIDE HCL 25 MG PO CAPS
25.0000 mg | ORAL_CAPSULE | Freq: Once | ORAL | Status: AC
  Administered 2011-12-18: 25 mg via ORAL
  Filled 2011-12-18: qty 1

## 2011-12-18 MED ORDER — ACETAMINOPHEN 325 MG PO TABS
650.0000 mg | ORAL_TABLET | Freq: Four times a day (QID) | ORAL | Status: DC | PRN
  Administered 2011-12-18 – 2011-12-20 (×7): 650 mg via ORAL

## 2011-12-18 MED ORDER — ADULT MULTIVITAMIN W/MINERALS CH
1.0000 | ORAL_TABLET | Freq: Every day | ORAL | Status: DC
  Administered 2011-12-18 – 2011-12-20 (×2): 1 via ORAL
  Filled 2011-12-18 (×5): qty 1

## 2011-12-18 MED ORDER — HYDROXYZINE HCL 25 MG PO TABS
25.0000 mg | ORAL_TABLET | Freq: Four times a day (QID) | ORAL | Status: DC | PRN
  Administered 2011-12-19 – 2011-12-21 (×4): 25 mg via ORAL

## 2011-12-18 MED ORDER — CHLORDIAZEPOXIDE HCL 25 MG PO CAPS
25.0000 mg | ORAL_CAPSULE | Freq: Four times a day (QID) | ORAL | Status: DC | PRN
  Administered 2011-12-18 (×2): 25 mg via ORAL
  Filled 2011-12-18 (×3): qty 1

## 2011-12-18 MED ORDER — VITAMIN B-1 100 MG PO TABS
100.0000 mg | ORAL_TABLET | Freq: Every day | ORAL | Status: DC
  Administered 2011-12-19 – 2011-12-21 (×3): 100 mg via ORAL
  Filled 2011-12-18 (×4): qty 1

## 2011-12-18 MED ORDER — PANTOPRAZOLE SODIUM 40 MG PO TBEC
40.0000 mg | DELAYED_RELEASE_TABLET | Freq: Every day | ORAL | Status: DC
  Administered 2011-12-18 – 2011-12-19 (×2): 40 mg via ORAL
  Filled 2011-12-18 (×5): qty 1

## 2011-12-18 NOTE — H&P (Signed)
Pt seen and evaluated upon admission.  Completed Admission Suicide Risk Assessment.  See orders.  Pt agreeable with plan.  Discussed with team.   

## 2011-12-18 NOTE — Progress Notes (Signed)
Pt was in bed upon first assessment.  She had not been up for breakfast and was requesting medications for her withdrawal.  She rated her depression an 8, her hopelessness a 5 and her anxiety a 7 on her self-inventory.  She denied any S/H ideations or A/V hallucinations.  Her noon CI WA was a 3 and her 1700 CI WA was a 10.  She has required several prn medications today so see mar, daily care and pain sections of the chart.  She was started on the librium protocol this afternoon and quickly the doctor increased her librium to 50 mg from the 25 mg.  Pt did not go down for lunch but had a sandwich brought back to her per her request.  She did go down for dinner tonight and claims she is now feeling much better but is "sleepy".  She really has not been up for any groups but since coming back from dinner, she has remained up either on the phone or in dayroom with peers watching tv and interacting appropriatley.

## 2011-12-18 NOTE — Progress Notes (Signed)
Pt observed in her room in bed awake.  She reports she is feeling better than she did this morning and her withdrawal symptoms have decreased.  Pt has been put on a custom taper of Librium for detox.  Pt still depressed/hopeless.  Encouraged pt to make her needs known to staff.  Pt denies SI/HI/AV at this time.  Safety maintained with q15 minute checks.

## 2011-12-18 NOTE — Tx Team (Signed)
Initial Interdisciplinary Treatment Plan  PATIENT STRENGTHS: (choose at least two) Ability for insight Average or above average intelligence Communication skills Financial means General fund of knowledge Supportive family/friends  PATIENT STRESSORS: Financial difficulties Marital or family conflict Medication change or noncompliance   PROBLEM LIST: Problem List/Patient Goals Date to be addressed Date deferred Reason deferred Estimated date of resolution  Substance abuse 12-18-11                                                      DISCHARGE CRITERIA:  Ability to meet basic life and health needs Adequate post-discharge living arrangements Medical problems require only outpatient monitoring Need for constant or close observation no longer present Verbal commitment to aftercare and medication compliance Withdrawal symptoms are absent or subacute and managed without 24-hour nursing intervention  PRELIMINARY DISCHARGE PLAN: Attend aftercare/continuing care group Participate in family therapy Return to previous living arrangement Return to previous work or school arrangements  PATIENT/FAMIILY INVOLVEMENT: This treatment plan has been presented to and reviewed with the patient, Jacqueline [name], and/or family memberSS.  The patient and family have been given the opportunity to ask questions and make suggestions.  [name] 12/18/2011, 1:37 AM

## 2011-12-18 NOTE — Progress Notes (Addendum)
Patient ID: Jacqueline [name], female   DOB: 07-20-1966, 46 y.o.   MRN: [id number] Pt os a 46 y.o. Female admitted for dextox. Pt. Reports drinking wine 3-4 (1.5 liters) daily, daily use of cocaine for the last , and Oxycodone use sine 2006. Pt. Reports she has been hospitalize in 3 different place since 8/12. Pt. Has hx. Of Peptic ulcer, Anemia,thrombocytopenia,  and Gastric by-pass in 2003, gall bladder removal in 2006.  Pt. Currently c/o burning when she urinates. Pt. Is mother of 3 children 20, 80, 54 year old a boy and two girls. Pt. Had recent falls in last 6 months she reports from drinking to much. Pt. Was made a fall risk.  Pt. Offered something to eat/drink. Pt. Oriented to hall/room. Staff will continue checks for safety. P.t is currently safe.

## 2011-12-18 NOTE — Progress Notes (Signed)
[facility] Group Notes:  (Counselor/Nursing/MHT/Case Management/Adjunct)  12/18/2011   Type of Therapy:  Group Therapy at 11:00AM  Participation Level:  Did Not Attend  [name] 12/18/2011, 12:14 PM  [facility] Group Notes:  (Counselor/Nursing/MHT/Case Management/Adjunct)  12/18/2011   Type of Therapy:  Group Therapy at 1:15PM  Participation Level:  Did Not Attend; patient not feeling well   [name] 12/18/2011, 10:19 AM

## 2011-12-18 NOTE — Progress Notes (Signed)
Counselor contacted patient's daughter, Melanie Crazier at [phone], in order to let her know she is okay, although not up to waiting in line to use phone today.   When asked for collateral information, Yvonna Alanis expressed no concerns from family at this time.  [name] 12/18/2011 1:14 PM
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
## 2011-12-19 MED ORDER — SERTRALINE HCL 50 MG PO TABS
50.0000 mg | ORAL_TABLET | Freq: Every day | ORAL | Status: DC
  Administered 2011-12-19 – 2011-12-21 (×3): 50 mg via ORAL
  Filled 2011-12-19 (×6): qty 1

## 2011-12-19 MED ORDER — DIPHENHYDRAMINE-ZINC ACETATE 2-0.1 % EX CREA
TOPICAL_CREAM | Freq: Two times a day (BID) | CUTANEOUS | Status: DC | PRN
  Administered 2011-12-19: 1 via TOPICAL
  Administered 2011-12-21: 08:00:00 via TOPICAL
  Filled 2011-12-19: qty 28.4

## 2011-12-19 MED ORDER — LEVOTHYROXINE SODIUM 112 MCG PO TABS
112.0000 ug | ORAL_TABLET | ORAL | Status: DC
  Administered 2011-12-20 – 2011-12-21 (×2): 112 ug via ORAL
  Filled 2011-12-19 (×3): qty 1

## 2011-12-19 MED ORDER — NICOTINE 21 MG/24HR TD PT24
21.0000 mg | MEDICATED_PATCH | Freq: Every day | TRANSDERMAL | Status: DC
  Administered 2011-12-20 – 2011-12-21 (×2): 21 mg via TRANSDERMAL
  Filled 2011-12-19 (×3): qty 1

## 2011-12-19 MED ORDER — MIRTAZAPINE 15 MG PO TABS
15.0000 mg | ORAL_TABLET | Freq: Every day | ORAL | Status: DC
  Administered 2011-12-19 – 2011-12-20 (×2): 15 mg via ORAL
  Filled 2011-12-19 (×4): qty 1

## 2011-12-19 NOTE — Discharge Planning (Signed)
New patient attended AM group, good participation.  States this is one of many admissions for her for detox and/or treatment.  Feels hopeless due to inability to stay sober over the last year.  Attends AA, but does not have a sponsor.  Not sure what she wants to do after detox at this point.  Has 2 misdemeanor charges that she would really like to get taken care of as she feels they are hanging over her head.  [facility] in [facility], kicked out for drinking.  No income.  Supported by boyfriend, mother.

## 2011-12-19 NOTE — Progress Notes (Signed)
[facility] Group Notes:  (Counselor/Nursing/MHT/Case Management/Adjunct)  12/19/2011 12:53 PM  Type of Therapy:  Group Therapy  Participation Level:  Minimal  Participation Quality:  Appropriate and Attentive  Affect:  Appropriate  Cognitive:  Alert and Appropriate  Insight:  Limited  Engagement in Group:  Limited  Engagement in Therapy:  Limited  Modes of Intervention:  Clarification, Education, Support and Exploration  Summary of Progress/Problems: Patient reported multiple hobbies she used to do before she began to abuse alcohol. Patient reported she use to enjoy going to the beach, reading books, swimming, and taking walks. Patient stated relaxation techniques have helped her in the past when dealing with mixed emotions.   Wilmon Arms 12/19/2011, 12:53 PM

## 2011-12-19 NOTE — Progress Notes (Signed)
Pt approached the nurse's station requesting med for a headache.  She says she feels that it may be a tension headache.  Tylenol given as ordered.  Pt reports she has some decisions she has to make related to her discharge.  She is facing jail time.  She is not sure she wants long term treatment, because she has gone through it before and it does seem to work.  Pt denies SI/HI at this time.  She says her withdrawal symptoms are minimal.  Safety maintained with q15 minute checks.

## 2011-12-19 NOTE — Progress Notes (Signed)
Pt did fill out her self-inventory and rated her depression a 5 hopelessness a 1 and his anxiety an 8.  She has been tearful at times today wanting the doctor to put her back on antidepressant that she used to take.  Encouraged her to speak with the doctor today to see if she will restart it for her now.  She also has been tearful and indecisive about if she should go straight to jail from here or go back to rehab somewhere.  She claims she has been in "so many programs" that she doesn't feel that it would be helpful.  She does admit that she has a couple of misdemeanors against her and she really feels she needs to take care of those first.  She stated,"I would be sober in jail because I can't get to any alcohol" She plans to talk with her case manager at some point and her doctor to make her finale decision.  She denies any S/H ideations or A/V hallucinations.  She has tried to go to groups today but has not remained throughout most today.  She has come out early or just not gone at all.

## 2011-12-19 NOTE — Progress Notes (Signed)
[facility] Group Notes:  (Counselor/Nursing/MHT/Case Management/Adjunct)  12/19/2011 2:10 PM  Type of Therapy:  Group Therapy at 1:15  Participation Level:  Active  Participation Quality:  Attentive and Sharing  Affect:  Depressed  Cognitive:  Oriented  Insight:  Limited  Engagement in Group:  Good  Engagement in Therapy:  Limited  Modes of Intervention:  Clarification, Socialization and Support  Summary of Progress/Problems:  Jacqueline [name] willingly shared that there are multiple emotions behind her emotions when she "uses anger to protect herself and my addiction"  Patient shared that shame and guilt often prompt her to put up a wall between her and others. When asked if she felt worthy of recovery patient was reluctant to share and became somewhat emotional when told she was worthy.    [name] 12/19/2011, 2:16 PM

## 2011-12-20 NOTE — Discharge Planning (Signed)
Pt attended morning group. Good mood, good participation. Pt reports feeling much better today and that she slept well. Pt ranks anxiety as a 7 due to the possibility of going to jail for stealing wine from the store. Pt believes that she has a warrant out for her arrest but is unsure. Pt was given the phone number to the county clerk office in [place] and [place] to investigate this matter. Pt states that after d/c she plans to live with her daughter in [place] through the weekend and turn herself in on Monday. Pt also expressed interest in a 1/2 house after handling all of her legal matters. Pt was given the phone number to [facility] as an option to explore. Pt scheduled for f/u at [facility] in [place] on 4/22.

## 2011-12-20 NOTE — Progress Notes (Signed)
Recreation Therapy Notes  12/20/2011         Time: 1415      Group Topic/Focus: The focus of this group is on discussing various styles of communication and communicating assertively using 'I' (feeling) statements.  Participation Level: Minimal  Participation Quality: Resistant  Affect: Irritable   Cognitive: Oriented   Additional Comments: Patient complaining about having to attend groups, left early.   Jacqueline [name] 12/20/2011 3:58 PM

## 2011-12-20 NOTE — Progress Notes (Signed)
[facility] Group Notes:  (Counselor/Nursing/MHT/Case Management/Adjunct)  12/20/2011 12:39 PM  Type of Therapy:  Group Therapy  Participation Level:  Active  Participation Quality:  Appropriate and Attentive  Affect:  Appropriate  Cognitive:  Alert and Appropriate  Insight:  Good  Engagement in Group:  Good  Engagement in Therapy:  Good  Modes of Intervention:  Clarification, Education, Support and Exploration  Summary of Progress/Problems: In discussing life and balance, patient reported she feels she does not get enough respect from others, she lacks friendships and support. Patient stated she can relate well to her peers in discussing how alcohol creates an imbalance in her life.   Jacqueline [name] 12/20/2011, 12:39 PM
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
## 2011-12-20 NOTE — Progress Notes (Signed)
Patient ID: Jacqueline [name], female   DOB: Sep 25, 1966, 46 y.o.   MRN: [id number] She has been up and about and to groups. Self inventory indicated that her appetite and attention  Has improved, depression at 5, hopelessness a 1. W/d symptoms chilling. She requested and received prn for chronic back pain this Am that did decrease pain.

## 2011-12-20 NOTE — Treatment Plan (Signed)
Interdisciplinary Treatment Plan Update (Adult)  Date: 12/20/2011  Time Reviewed: 8:13 AM   Progress in Treatment: Attending groups: Yes Participating in groups: Yes Taking medication as prescribed: Yes Tolerating medication: Yes   Family/Significant other contact made: None identified  Patient understands diagnosis:  Yes  As evidenced by asking for help with polysubstance dependence Discussing patient identified problems/goals with staff:  Yes  See below Medical problems stabilized or resolved:  Yes Denies suicidal/homicidal ideation: Yes  On self inventory, in tx team Issues/concerns per patient self-inventory:  Yes  Depression is 5,  C/o withdrawal symptoms, headache, lightheadedness, pain Other:  New problem(s) identified: N/A  Reason for Continuation of Hospitalization: Anxiety Medication stabilization Withdrawal symptoms  Interventions implemented related to continuation of hospitalization: Librium taper, encourage group attendance and participation  Additional comments:  Estimated length of stay:1 day  Discharge Plan:See below  New goal(s): N/A  Review of initial/current patient goals per problem list:   1.  Goal(s):Safely detox from alcohol, benzos  Met:  No  Target date:3/14  As evidenced RU:EAVW score of 0,  Stable vitals  2.  Goal (s): Identify comprehensive sobriety plan  Met:  Yes  Target date:3/14  As evidenced by: Planning to go to daughter's for the weekend, then jail, then half way house  3.  Goal(s):  Met:  No  Target date:  As evidenced by:  4.  Goal(s):  Met:  No  Target date:  As evidenced by:  Attendees: Patient:  [name] 12/20/2011 8:13 AM  Family:     Physician:  Lupe Carney 12/20/2011 8:13 AM   Nursing: Roswell Miners   12/20/2011 8:13 AM   Case Manager:  Richelle Ito, LCSW 12/20/2011 8:13 AM   Counselor:  Ronda Fairly, LCSWA 12/20/2011 8:13 AM   Other: Verne Spurr  12/20/2011 8:13 AM       Other:     Other:      Other:      Scribe for Treatment Team:   Ida Rogue, 12/20/2011 8:13 AM

## 2011-12-21 MED ORDER — MIRTAZAPINE 15 MG PO TABS: 15.0000 mg | ORAL_TABLET | Freq: Every day | ORAL | Status: DC

## 2011-12-21 MED ORDER — LEVOTHYROXINE SODIUM 112 MCG PO TABS: 112.0000 ug | ORAL_TABLET | ORAL | Status: DC

## 2011-12-21 MED ORDER — SERTRALINE HCL 50 MG PO TABS: 50.0000 mg | ORAL_TABLET | Freq: Every day | ORAL | Status: DC

## 2011-12-21 MED ORDER — TRAZODONE HCL 100 MG PO TABS: 100.0000 mg | ORAL_TABLET | Freq: Every evening | ORAL | Status: DC | PRN
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
## 2011-12-21 NOTE — Discharge Summary (Signed)
Physician Discharge Summary Note  Patient:  Jacqueline [name] is an 46 y.o., female MRN:  [id number] DOB:  06-11-1966 Patient phone:  [phone] (home)  Patient address:   [address] [place] [address],   Date of Admission:  12/18/2011 Date of Discharge: 12/21/2011  Reason for Admission: Detox  Discharge Diagnoses: Active Problems:  Abnormal liver enzymes  Alcohol dependence with intoxication  Cocaine abuse, episodic  Hypothyroidism  Anemia  PUD (peptic ulcer disease)   Axis Diagnosis:   AXIS I:  Alcohol detox AXIS II:  Deferred AXIS III:   Past Medical History  Diagnosis Date  . Peptic ulcer   . Alcohol abuse   . Anemia   . Benzodiazepine abuse   . Depression   . Anxiety   . Thyroid disease   . Alcoholism   . Narcotic abuse   . Back pain   . Thrombocytopenia 06/17/2011  AXIS IV:  Problems with primary support group as well as legal issues AXIS V:  GAF: 60  Level of Care:out patient Hospital Course:  Jacqueline [name] was admitted for crisis management and stabilization with alcohol detox.  She did well with the standard Librium protocol.  She was evaluated and treated by the treatment team including an MD, PAC, RN,LCSW and case manager.  She was also part of unit programming as well as groups from NA/AA.  Upon meeting with the treatment team, Jacqueline [name] did not desire further rehabilitation at this time but rather preferred to attend to some pressing legal matters she had pending.  On her third day of admission she endorsed no further symptoms of withdrawal and requested discharge.  Jacqueline [name] was evaluated a 2nd time by the treatment team and felt to be safe for discharge home.  Consults:  none Significant Diagnostic Studies:  none  Discharge Vitals:   Blood pressure 106/66, pulse 109, temperature 97.6 F (36.4 C), temperature source Oral, resp. rate 16, height 5' 7.5" (1.715 m), weight 76.204 kg (168 lb), last menstrual period 12/02/2011.  Mental Status Exam: See Mental Status  Examination and Suicide Risk Assessment completed by Attending Physician prior to discharge.  Discharge destination:  home  Is patient on multiple antipsychotic therapies at discharge:  no   Has Patient had three or more failed trials of antipsychotic monotherapy by history:  no  Recommended Plan for Multiple Antipsychotic Therapies: not applicable  Discharge Orders    Future Orders Please Complete By Expires   Diet - low sodium heart healthy      Increase activity slowly      Discharge instructions      Comments:   Take all medications as prescribed.     Medication List  As of 12/21/2011  8:59 AM   STOP taking these medications         buPROPion 150 MG 24 hr tablet      clonazePAM 1 MG tablet      ibuprofen 200 MG tablet      thiamine 100 MG tablet         TAKE these medications      Indication    levothyroxine 112 MCG tablet   Commonly known as: SYNTHROID, LEVOTHROID   Take 1 tablet (112 mcg total) by mouth daily.       loratadine 10 MG tablet   Commonly known as: CLARITIN   Take 1 tablet (10 mg total) by mouth daily.       mirtazapine 15 MG tablet   Commonly known as: REMERON  Take 1 tablet (15 mg total) by mouth at bedtime.       omeprazole 40 MG capsule   Commonly known as: PRILOSEC   Take 40 mg by mouth daily.       sertraline 50 MG tablet   Commonly known as: ZOLOFT   Take 1 tablet (50 mg total) by mouth daily. For depression and anxiety.              Follow-up recommendations:  Keep all scheduled appointments.  Comments:  Take all medications as prescribed.  Regular activity as tolerated, low fat low carb diet.  Follow up with PCP and Psychiatrist as planned.  Signed: Rona Ravens. Aubra Pappalardo PAC For Dr. Lupe Carney 12/21/2011, 8:59 AM

## 2011-12-21 NOTE — Progress Notes (Signed)
Lying quietly in bed with eyes closed.  Exhibiting normal sleep behavior.  Q 15 minute safety checks in progress.

## 2011-12-21 NOTE — Progress Notes (Signed)
[facility] Case Management Discharge Plan:  Will you be returning to the same living situation after discharge: No. At discharge, do you have transportation home?:Yes,  daughter Do you have the ability to pay for your medications:Yes,  mental health  Interagency Information:     Release of information consent forms completed and in the chart;  Patient's signature needed at discharge.  Patient to Follow up at:  Follow-up Information    Follow up with [facility]. (Walk in between 8 and 5 on wweekdays)    Contact information:   [address]  [phone]      Follow up with [facility] on 01/28/2012. (10AM  This is not with the DR.  This is to re-establish you as pt there)    Contact information:   [address] [place]  [phone]         Patient denies SI/HI:   Yes,  yes    Safety Planning and Suicide Prevention discussed:  Yes,  yes  Barrier to discharge identified:No.  Summary and Recommendations:   Jacqueline [name] 12/21/2011, 9:18 AM

## 2011-12-21 NOTE — Progress Notes (Signed)
Pt cooperative during d/c process. Denies SI/HI. All d/c instructions reviewed and pt verbalized understanding.  Medications reviewed and samples with rx's given.  All belongings returned and escorted to lobby to care of family.

## 2011-12-21 NOTE — Progress Notes (Signed)
[facility] Group Notes:  (Counselor/Nursing/MHT/Case Management/Adjunct)  12/21/2011 1:14 PM  Type of Therapy:  Group Therapy  Participation Level:  Minimal  Participation Quality:  Attentive  Affect:  Flat  Cognitive:  Appropriate  Insight:  Limited  Engagement in Group:  Limited  Engagement in Therapy:  Limited  Modes of Intervention:  Problem-solving, Support and exploration  Summary of Progress/Problems: Pt attended group therapy to explore triggers and relapse prevention. Therapist used a DBT technique called radical acceptance to help pt's to move forward from their past to a road of recovery. Pt quiet but attentive.  Shyhiem Beeney L 12/21/2011, 1:14 PM

## 2011-12-26 NOTE — Progress Notes (Signed)
Patient Discharge Instructions:  Psychiatric Admission Assessment Note Provided,  12/26/2011 Discharge Summary Note Provided,   12/26/2011 After Visit Summary (AVS) Provided,  12/26/2011 Face Sheet Provided, 12/26/2011 Faxed/Sent to the Next Level Care provider:  12/26/2011  Faxed to [facility] @ [phone] And to [facility] @ [phone]  [name], Eduard Clos, 12/26/2011, 1:43 PM

## 2012-02-20 ENCOUNTER — Encounter (HOSPITAL_COMMUNITY): Payer: Self-pay

## 2012-02-20 ENCOUNTER — Emergency Department (HOSPITAL_COMMUNITY)
Admission: EM | Admit: 2012-02-20 | Discharge: 2012-02-21 | Disposition: A | Discharge status: Other Acute Inpatient Hospital | Payer: PRIVATE HEALTH INSURANCE | Source: Home / Self Care | Attending: Emergency Medicine | Admitting: Emergency Medicine

## 2012-02-20 DIAGNOSIS — F1010 Alcohol abuse, uncomplicated: Secondary | ICD-10-CM

## 2012-02-20 LAB — COMPREHENSIVE METABOLIC PANEL
AST: 52 U/L — ABNORMAL HIGH (ref 0–37)
Albumin: 3.8 g/dL (ref 3.5–5.2)
Calcium: 8.6 mg/dL (ref 8.4–10.5)
Creatinine, Ser: 0.84 mg/dL (ref 0.50–1.10)
Total Protein: 7.4 g/dL (ref 6.0–8.3)

## 2012-02-20 LAB — ACETAMINOPHEN LEVEL: Acetaminophen (Tylenol), Serum: <15 ug/mL (ref 10–30)

## 2012-02-20 LAB — CBC
HCT: 26.3 % — ABNORMAL LOW (ref 36.0–46.0)
Hemoglobin: 7.9 g/dL — ABNORMAL LOW (ref 12.0–15.0)
MCHC: 30 g/dL (ref 30.0–36.0)
Platelets: 607 10*3/uL — ABNORMAL HIGH (ref 150–400)
RBC: 3.17 MIL/uL — ABNORMAL LOW (ref 3.87–5.11)

## 2012-02-20 LAB — RAPID URINE DRUG SCREEN, HOSP PERFORMED
Amphetamines: NOT DETECTED
Benzodiazepines: NOT DETECTED
Opiates: NOT DETECTED

## 2012-02-20 MED ORDER — ACETAMINOPHEN 325 MG PO TABS: 650.0000 mg | ORAL_TABLET | ORAL | Status: DC | PRN

## 2012-02-20 MED ORDER — ONDANSETRON HCL 4 MG PO TABS: 4.0000 mg | ORAL_TABLET | Freq: Three times a day (TID) | ORAL | Status: DC | PRN

## 2012-02-20 MED ORDER — FLUOXETINE HCL 20 MG PO CAPS
40.0000 mg | ORAL_CAPSULE | Freq: Every day | ORAL | Status: DC
  Filled 2012-02-20: qty 2

## 2012-02-20 MED ORDER — LEVOTHYROXINE SODIUM 112 MCG PO TABS
112.0000 ug | ORAL_TABLET | Freq: Every day | ORAL | Status: DC
  Filled 2012-02-20: qty 1

## 2012-02-20 MED ORDER — ZOLPIDEM TARTRATE 5 MG PO TABS
5.0000 mg | ORAL_TABLET | Freq: Every evening | ORAL | Status: DC | PRN
  Administered 2012-02-20: 5 mg via ORAL
  Filled 2012-02-20: qty 1

## 2012-02-20 MED ORDER — PANTOPRAZOLE SODIUM 40 MG PO TBEC: 40.0000 mg | DELAYED_RELEASE_TABLET | Freq: Every day | ORAL | Status: DC

## 2012-02-20 MED ORDER — LORATADINE 10 MG PO TABS: 10.0000 mg | ORAL_TABLET | Freq: Every day | ORAL | Status: DC

## 2012-02-20 MED ORDER — LAMOTRIGINE 100 MG PO TABS
100.0000 mg | ORAL_TABLET | Freq: Every day | ORAL | Status: DC
  Filled 2012-02-20: qty 1

## 2012-02-20 MED ORDER — SERTRALINE HCL 50 MG PO TABS
50.0000 mg | ORAL_TABLET | Freq: Every day | ORAL | Status: DC
Start: 2012-02-20 — End: 2012-02-21

## 2012-02-20 MED ORDER — NICOTINE 21 MG/24HR TD PT24
21.0000 mg | MEDICATED_PATCH | Freq: Every day | TRANSDERMAL | Status: DC
  Administered 2012-02-20: 21 mg via TRANSDERMAL
  Filled 2012-02-20: qty 1

## 2012-02-20 NOTE — ED Notes (Signed)
Pt here for detox from etoh and she wants help coming off benzos, she also states that she's out of her thyroid medications

## 2012-02-20 NOTE — BH Assessment (Addendum)
Assessment Note   Jacqueline [name] is a 46 y.o. female who presets to [facility] ED voluntarily, requesting detox from alcohol. Pt states she drinks 1 and 1/2 cases of beer daily, and has for the past 5 months. She states she has been drinking heavily on and off since age 45 and states her first drink was at age 78. She also reports a history of cocaine use, but states she has not used for several months. She reports no current stressors and states her reasoning for seeking treatment now is because she is "tired of it." She expresses that she is motivated to change at this time.   Pt denies SI, HI, and AHVH. She states she has a long history of depression including 2 prior suicide attempts in 1996. She reports experiencing insomnia and feelings of worthlessness. She reports been through inpatient treatment for mental health 7 or 8 times. Per notes, pt's most recent inpatient treatment was in 3/13 at [facility] for detox. She also endorses anxiety, stating she has panic attacks when she is in large groups of people. She states use to receive ACTT services from [facility] services but currently has no outpatient provider. Pt was not able to identify any social supports. Pt appeared disheveled and guarded. Pt was also a poor historian.      Axis I: Mood Disorder NOS and Alcohol Dependence  Axis II: Deferred Axis III:  Past Medical History  Diagnosis Date  . Peptic ulcer   . Alcohol abuse   . Anemia   . Benzodiazepine abuse   . Depression   . Anxiety   . Thyroid disease   . Alcoholism   . Narcotic abuse   . Back pain   . Thrombocytopenia 06/17/2011   Axis IV: other psychosocial or environmental problems and problems related to social environment Axis V: 41-50 serious symptoms  Past Medical History:  Past Medical History  Diagnosis Date  . Peptic ulcer   . Alcohol abuse   . Anemia   . Benzodiazepine abuse   . Depression   . Anxiety   . Thyroid disease   . Alcoholism   . Narcotic abuse   . Back pain     . Thrombocytopenia 06/17/2011    Past Surgical History  Procedure Date  . Cholecystectomy   . Abdominal surgery   . Esophagogastroduodenoscopy   . Gastric bypass   . Tubal ligation     Family History:  Family History  Problem Relation Age of Onset  . Cancer Father   . Cancer Other     Social History:  reports that she quit smoking about 6 months ago. Her smoking use included Cigarettes. She has a 10 pack-year smoking history. She has never used smokeless tobacco. She reports that she drinks alcohol. She reports that she uses illicit drugs (Oxycodone and Cocaine).  Additional Social History:  Alcohol / Drug Use History of alcohol / drug use?: Yes Substance #1 Name of Substance 1: Alcohol 1 - Age of First Use: 5 1 - Amount (size/oz): 1 and 1/2 case of beer 1 - Frequency: daily 1 - Duration: 5 months 1 - Last Use / Amount: 02/20/12 Allergies:  Allergies  Allergen Reactions  . Nsaids Other (See Comments)    G.I. Bleed    Home Medications:  (Not in a hospital admission)  OB/GYN Status:  Patient's last menstrual period was 02/20/2012.  General Assessment Data Location of Assessment: [facility] ED Living Arrangements: Alone Can pt return to current living arrangement?: Yes  Admission Status: Voluntary Is patient capable of signing voluntary admission?: Yes Transfer from: Home Referral Source: Self/Family/Friend  Education Status Is patient currently in school?: No Highest grade of school patient has completed: 2 years of college  Risk to self Suicidal Ideation: No Suicidal Intent: No Is patient at risk for suicide?: No Suicidal Plan?: No Access to Means: No What has been your use of drugs/alcohol within the last 12 months?: current alcohol abuse, past cociane use  Previous Attempts/Gestures: Yes How many times?: 2  Other Self Harm Risks: none Triggers for Past Attempts: Other personal contacts Intentional Self Injurious Behavior: None Family Suicide History:  No Recent stressful life event(s):  (none noted) Persecutory voices/beliefs?: No Depression: Yes Depression Symptoms: Despondent;Insomnia;Isolating;Loss of interest in usual pleasures Substance abuse history and/or treatment for substance abuse?: Yes Suicide prevention information given to non-admitted patients: Not applicable  Risk to Others Homicidal Ideation: No Thoughts of Harm to Others: No Current Homicidal Intent: No Current Homicidal Plan: No Access to Homicidal Means: No Identified Victim: none History of harm to others?: No Assessment of Violence: None Noted Violent Behavior Description: cooperative Does patient have access to weapons?: No Criminal Charges Pending?: Yes Describe Pending Criminal Charges: larenceny Does patient have a court date: Yes Court Date: 02/25/12  Psychosis Hallucinations: None noted Delusions: None noted  Mental Status Report Appear/Hygiene: Disheveled Eye Contact: Fair Motor Activity: Unremarkable Speech: Logical/coherent Level of Consciousness: Alert Mood: Irritable Affect: Irritable Anxiety Level: Severe Thought Processes: Coherent;Relevant Judgement: Impaired Orientation: Person;Place;Time;Situation Obsessive Compulsive Thoughts/Behaviors: None  Cognitive Functioning Concentration: Normal Memory: Recent Intact;Remote Intact IQ: Average Insight: Fair Impulse Control: Poor Appetite: Fair Weight Loss: 0  Weight Gain: 0  Sleep: Decreased Vegetative Symptoms: None  Prior Inpatient Therapy Prior Inpatient Therapy: Yes Prior Therapy Dates: various times since 1996 Prior Therapy Facilty/Provider(s): various places between [facility] and [facility] Reason for Treatment: depression and SA  Prior Outpatient Therapy Prior Outpatient Therapy: Yes Prior Therapy Dates: 2012 Prior Therapy Facilty/Provider(s): [facility] Reason for Treatment: depression  ADL Screening (condition at time of admission) Patient's cognitive  ability adequate to safely complete daily activities?: Yes Patient able to express need for assistance with ADLs?: Yes Independently performs ADLs?: Yes Weakness of Legs: None Weakness of Arms/Hands: None  Home Assistive Devices/Equipment Home Assistive Devices/Equipment: None    Abuse/Neglect Assessment (Assessment to be complete while patient is alone) Physical Abuse: Denies Verbal Abuse: Denies Sexual Abuse: Denies Exploitation of patient/patient's resources: Denies Self-Neglect: Denies Values / Beliefs Cultural Requests During Hospitalization: None Spiritual Requests During Hospitalization: None   Advance Directives (For Healthcare) Advance Directive: Patient does not have advance directive Pre-existing out of facility DNR order (yellow form or pink MOST form): No Nutrition Screen Diet: Regular Unintentional weight loss greater than 10lbs within the last month: No Problems chewing or swallowing foods and/or liquids: No Home Tube Feeding or Total Parenteral Nutrition (TPN): No Patient appears severely malnourished: No Pregnant or Lactating: No  Additional Information 1:1 In Past 12 Months?: No CIRT Risk: No Elopement Risk: No Does patient have medical clearance?: Yes     Disposition:  Disposition Disposition of Patient: Referred to;Inpatient treatment program Type of inpatient treatment program: Adult Patient referred to: [facility] Pt has been accepted to [facility] by Dr Elmon Kirschner to Dr. Koren Shiver [id number] [id number]. On Site Evaluation by:   Reviewed with Physician:     Georgina Quint A 02/20/2012 11:03 PM

## 2012-02-20 NOTE — ED Provider Notes (Signed)
History     CSN: [id number]  Arrival date & time 02/20/12  1924   First MD Initiated Contact with Patient 02/20/12 2023      Chief Complaint  Patient presents with  . Medical Clearance    (Consider location/radiation/quality/duration/timing/severity/associated sxs/prior treatment) HPI Comments: Patient here requesting detox from alcohol and benzo's - she states that she drinks about 1 case of beer a day and takes regularly scheduled klonopin three times per day.  She reports her last drink was just prior to getting here.  She has tried to quit in the past without success.  She states that she is tired of drinking and really needs to quit - reports that she has gone into DT's in the past where she has "seen bugs crawling on the walls".  She denies suicidal or homicidal ideation at this time.  Patient is a 46 y.o. female presenting with alcohol problem. The history is provided by the patient and the spouse. No language interpreter was used.  Alcohol Problem This is a recurrent problem. The current episode started today. The problem occurs constantly. The problem has been unchanged. Pertinent negatives include no abdominal pain, anorexia, arthralgias, change in bowel habit, chest pain, chills, congestion, coughing, diaphoresis, fatigue, fever, headaches, joint swelling, myalgias, nausea, neck pain, numbness, rash, sore throat, swollen glands, urinary symptoms, vertigo, visual change, vomiting or weakness. The symptoms are aggravated by nothing. She has tried nothing for the symptoms. The treatment provided no relief.    Past Medical History  Diagnosis Date  . Peptic ulcer   . Alcohol abuse   . Anemia   . Benzodiazepine abuse   . Depression   . Anxiety   . Thyroid disease   . Alcoholism   . Narcotic abuse   . Back pain   . Thrombocytopenia 06/17/2011    Past Surgical History  Procedure Date  . Cholecystectomy   . Abdominal surgery   . Esophagogastroduodenoscopy   . Gastric bypass    . Tubal ligation     Family History  Problem Relation Age of Onset  . Cancer Father   . Cancer Other     History  Substance Use Topics  . Smoking status: Former Smoker -- 1.0 packs/day for 10 years    Types: Cigarettes    Quit date: 08/08/2011  . Smokeless tobacco: Never Used  . Alcohol Use: Yes     patient states  "a lot."  Last drink 9/4 before arrival to ED    OB History    Grav Para Term Preterm Abortions TAB SAB Ect Mult Living   3 3  3      3       Review of Systems  Constitutional: Negative for fever, chills, diaphoresis and fatigue.  HENT: Negative for congestion, sore throat and neck pain.   Respiratory: Negative for cough.   Cardiovascular: Negative for chest pain.  Gastrointestinal: Negative for nausea, vomiting, abdominal pain, anorexia and change in bowel habit.  Musculoskeletal: Negative for myalgias, joint swelling and arthralgias.  Skin: Negative for rash.  Neurological: Negative for vertigo, weakness, numbness and headaches.  All other systems reviewed and are negative.    Allergies  Nsaids  Home Medications   Current Outpatient Rx  Name Route Sig Dispense Refill  . FLUOXETINE HCL 40 MG PO CAPS Oral Take 40 mg by mouth daily.    Marland Kitchen LAMOTRIGINE 100 MG PO TABS Oral Take 100 mg by mouth daily.    Marland Kitchen LEVOTHYROXINE SODIUM 112 MCG PO  TABS Oral Take 1 tablet (112 mcg total) by mouth every morning. For thyroid support. 30 tablet 0  . LORATADINE 10 MG PO TABS Oral Take 1 tablet (10 mg total) by mouth daily. 30 tablet 1  . OMEPRAZOLE 40 MG PO CPDR Oral Take 40 mg by mouth daily.      . SERTRALINE HCL 50 MG PO TABS Oral Take 1 tablet (50 mg total) by mouth daily. For depression and anxiety. 30 tablet 0    BP 130/69  Pulse 84  Temp(Src) 99 F (37.2 C) (Oral)  Resp 18  SpO2 98%  LMP 02/20/2012  Physical Exam  Nursing note and vitals reviewed. Constitutional: She is oriented to person, place, and time. She appears well-developed and well-nourished. No  distress.  HENT:  Head: Normocephalic and atraumatic.  Right Ear: External ear normal.  Left Ear: External ear normal.  Nose: Nose normal.  Mouth/Throat: Oropharynx is clear and moist. No oropharyngeal exudate.  Eyes: Conjunctivae are normal. Pupils are equal, round, and reactive to light. No scleral icterus.  Neck: Normal range of motion. Neck supple.  Cardiovascular: Normal rate, regular rhythm and normal heart sounds.  Exam reveals no gallop and no friction rub.   No murmur heard. Pulmonary/Chest: Effort normal and breath sounds normal. No respiratory distress. She has no wheezes. She has no rales. She exhibits no tenderness.  Abdominal: Soft. Bowel sounds are normal. She exhibits no distension. There is no tenderness.  Musculoskeletal: Normal range of motion. She exhibits no edema and no tenderness.  Lymphadenopathy:    She has no cervical adenopathy.  Neurological: She is alert and oriented to person, place, and time. No cranial nerve deficit.  Skin: Skin is warm and dry. No rash noted. No erythema. No pallor.  Psychiatric: She has a normal mood and affect. Her behavior is normal. Judgment and thought content normal.    ED Course  Procedures (including critical care time)  Labs Reviewed  CBC - Abnormal; Notable for the following:    RBC 3.17 (*)    Hemoglobin 7.9 (*)    HCT 26.3 (*)    MCH 24.9 (*)    RDW 19.2 (*)    Platelets 607 (*)    All other components within normal limits  COMPREHENSIVE METABOLIC PANEL - Abnormal; Notable for the following:    Sodium 134 (*)    AST 52 (*)    ALT 60 (*)    Total Bilirubin 0.2 (*)    GFR calc non Af Amer 84 (*)    All other components within normal limits  ETHANOL - Abnormal; Notable for the following:    Alcohol, Ethyl (B) 222 (*)    All other components within normal limits  ACETAMINOPHEN LEVEL  URINE RAPID DRUG SCREEN (HOSP PERFORMED)  POCT PREGNANCY, URINE  TSH   No results found. Results for orders placed during the  hospital encounter of 02/20/12  CBC      Component Value Range   WBC 7.0  4.0 - 10.5 (K/uL)   RBC 3.17 (*) 3.87 - 5.11 (MIL/uL)   Hemoglobin 7.9 (*) 12.0 - 15.0 (g/dL)   HCT 40.9 (*) 81.1 - 46.0 (%)   MCV 83.0  78.0 - 100.0 (fL)   MCH 24.9 (*) 26.0 - 34.0 (pg)   MCHC 30.0  30.0 - 36.0 (g/dL)   RDW 91.4 (*) 78.2 - 15.5 (%)   Platelets 607 (*) 150 - 400 (K/uL)  COMPREHENSIVE METABOLIC PANEL      Component Value  Range   Sodium 134 (*) 135 - 145 (mEq/L)   Potassium 3.9  3.5 - 5.1 (mEq/L)   Chloride 98  96 - 112 (mEq/L)   CO2 22  19 - 32 (mEq/L)   Glucose, Bld 74  70 - 99 (mg/dL)   BUN 16  6 - 23 (mg/dL)   Creatinine, Ser 1.61  0.50 - 1.10 (mg/dL)   Calcium 8.6  8.4 - 09.6 (mg/dL)   Total Protein 7.4  6.0 - 8.3 (g/dL)   Albumin 3.8  3.5 - 5.2 (g/dL)   AST 52 (*) 0 - 37 (U/L)   ALT 60 (*) 0 - 35 (U/L)   Alkaline Phosphatase 69  39 - 117 (U/L)   Total Bilirubin 0.2 (*) 0.3 - 1.2 (mg/dL)   GFR calc non Af Amer 84 (*) >90 (mL/min)   GFR calc Af Amer >90  >90 (mL/min)  ETHANOL      Component Value Range   Alcohol, Ethyl (B) 222 (*) 0 - 11 (mg/dL)  ACETAMINOPHEN LEVEL      Component Value Range   Acetaminophen (Tylenol), Serum <15.0  10 - 30 (ug/mL)  URINE RAPID DRUG SCREEN (HOSP PERFORMED)      Component Value Range   Opiates NONE DETECTED  NONE DETECTED    Cocaine NONE DETECTED  NONE DETECTED    Benzodiazepines NONE DETECTED  NONE DETECTED    Amphetamines NONE DETECTED  NONE DETECTED    Tetrahydrocannabinol NONE DETECTED  NONE DETECTED    Barbiturates NONE DETECTED  NONE DETECTED   POCT PREGNANCY, URINE      Component Value Range   Preg Test, Ur NEGATIVE  NEGATIVE    No results found.    Alcohol Detox    MDM  Patient here requesting detox from alcohol - she reports heavy alcohol use in the past - she would also like help coming off of benzo's.  She has been accepted at [facility] and will be transferred there for continued treatment.        Izola Price [place],  [place] 02/21/12 [phone]

## 2012-02-21 ENCOUNTER — Ambulatory Visit (HOSPITAL_COMMUNITY): Admission: EM | Admit: 2012-02-21 | Payer: PRIVATE HEALTH INSURANCE | Source: Ambulatory Visit | Admitting: Psychiatry

## 2012-02-21 ENCOUNTER — Inpatient Hospital Stay (HOSPITAL_COMMUNITY)
Admission: EM | Admit: 2012-02-21 | Discharge: 2012-02-24 | DRG: 897 | Disposition: A | Discharge status: Home / Self Care | Payer: PRIVATE HEALTH INSURANCE | Source: Ambulatory Visit | Attending: Physician Assistant | Admitting: Physician Assistant

## 2012-02-21 ENCOUNTER — Encounter (HOSPITAL_COMMUNITY): Payer: Self-pay | Admitting: Emergency Medicine

## 2012-02-21 DIAGNOSIS — F10939 Alcohol use, unspecified with withdrawal, unspecified: Principal | ICD-10-CM | POA: Diagnosis not present

## 2012-02-21 DIAGNOSIS — Z8711 Personal history of peptic ulcer disease: Secondary | ICD-10-CM

## 2012-02-21 DIAGNOSIS — F1320 Sedative, hypnotic or anxiolytic dependence, uncomplicated: Secondary | ICD-10-CM | POA: Diagnosis present

## 2012-02-21 DIAGNOSIS — D696 Thrombocytopenia, unspecified: Secondary | ICD-10-CM

## 2012-02-21 DIAGNOSIS — E039 Hypothyroidism, unspecified: Secondary | ICD-10-CM | POA: Diagnosis present

## 2012-02-21 DIAGNOSIS — F32A Depression, unspecified: Secondary | ICD-10-CM | POA: Diagnosis present

## 2012-02-21 DIAGNOSIS — F1410 Cocaine abuse, uncomplicated: Secondary | ICD-10-CM

## 2012-02-21 DIAGNOSIS — R748 Abnormal levels of other serum enzymes: Secondary | ICD-10-CM

## 2012-02-21 DIAGNOSIS — F1020 Alcohol dependence, uncomplicated: Secondary | ICD-10-CM | POA: Diagnosis present

## 2012-02-21 DIAGNOSIS — F10232 Alcohol dependence with withdrawal with perceptual disturbance: Secondary | ICD-10-CM

## 2012-02-21 DIAGNOSIS — K279 Peptic ulcer, site unspecified, unspecified as acute or chronic, without hemorrhage or perforation: Secondary | ICD-10-CM

## 2012-02-21 DIAGNOSIS — F10239 Alcohol dependence with withdrawal, unspecified: Principal | ICD-10-CM | POA: Diagnosis not present

## 2012-02-21 DIAGNOSIS — Z79899 Other long term (current) drug therapy: Secondary | ICD-10-CM

## 2012-02-21 DIAGNOSIS — F19939 Other psychoactive substance use, unspecified with withdrawal, unspecified: Secondary | ICD-10-CM | POA: Diagnosis not present

## 2012-02-21 DIAGNOSIS — N760 Acute vaginitis: Secondary | ICD-10-CM

## 2012-02-21 DIAGNOSIS — F410 Panic disorder [episodic paroxysmal anxiety] without agoraphobia: Secondary | ICD-10-CM | POA: Diagnosis present

## 2012-02-21 DIAGNOSIS — F419 Anxiety disorder, unspecified: Secondary | ICD-10-CM

## 2012-02-21 DIAGNOSIS — N390 Urinary tract infection, site not specified: Secondary | ICD-10-CM

## 2012-02-21 DIAGNOSIS — F10229 Alcohol dependence with intoxication, unspecified: Secondary | ICD-10-CM

## 2012-02-21 DIAGNOSIS — F1910 Other psychoactive substance abuse, uncomplicated: Secondary | ICD-10-CM | POA: Diagnosis present

## 2012-02-21 DIAGNOSIS — F1110 Opioid abuse, uncomplicated: Secondary | ICD-10-CM | POA: Diagnosis present

## 2012-02-21 DIAGNOSIS — F39 Unspecified mood [affective] disorder: Secondary | ICD-10-CM | POA: Diagnosis present

## 2012-02-21 DIAGNOSIS — F329 Major depressive disorder, single episode, unspecified: Secondary | ICD-10-CM

## 2012-02-21 DIAGNOSIS — D649 Anemia, unspecified: Secondary | ICD-10-CM | POA: Diagnosis present

## 2012-02-21 HISTORY — DX: Hypothyroidism, unspecified: E03.9

## 2012-02-21 LAB — TSH: TSH: 16.835 u[IU]/mL — ABNORMAL HIGH (ref 0.350–4.500)

## 2012-02-21 MED ORDER — LAMOTRIGINE 100 MG PO TABS
100.0000 mg | ORAL_TABLET | Freq: Every day | ORAL | Status: DC
  Administered 2012-02-21 – 2012-02-24 (×4): 100 mg via ORAL
  Filled 2012-02-21 (×4): qty 1
  Filled 2012-02-21: qty 14
  Filled 2012-02-21: qty 1

## 2012-02-21 MED ORDER — SUCRALFATE 1 G PO TABS
1.0000 g | ORAL_TABLET | Freq: Three times a day (TID) | ORAL | Status: DC
  Administered 2012-02-21 – 2012-02-24 (×12): 1 g via ORAL
  Filled 2012-02-21 (×20): qty 1

## 2012-02-21 MED ORDER — NICOTINE 21 MG/24HR TD PT24
MEDICATED_PATCH | TRANSDERMAL | Status: AC
  Filled 2012-02-21: qty 1

## 2012-02-21 MED ORDER — CHLORDIAZEPOXIDE HCL 25 MG PO CAPS
25.0000 mg | ORAL_CAPSULE | Freq: Four times a day (QID) | ORAL | Status: DC
  Administered 2012-02-21 (×2): 25 mg via ORAL
  Filled 2012-02-21: qty 1

## 2012-02-21 MED ORDER — THIAMINE HCL 100 MG/ML IJ SOLN: 100.0000 mg | Freq: Once | INTRAMUSCULAR | Status: DC

## 2012-02-21 MED ORDER — FOLIC ACID 1 MG PO TABS
1.0000 mg | ORAL_TABLET | Freq: Every day | ORAL | Status: DC
  Administered 2012-02-21 – 2012-02-24 (×4): 1 mg via ORAL
  Filled 2012-02-21 (×6): qty 1

## 2012-02-21 MED ORDER — MIRTAZAPINE 15 MG PO TABS
15.0000 mg | ORAL_TABLET | Freq: Every day | ORAL | Status: DC
  Administered 2012-02-21 – 2012-02-23 (×3): 15 mg via ORAL
  Filled 2012-02-21: qty 1
  Filled 2012-02-21: qty 14
  Filled 2012-02-21 (×4): qty 1

## 2012-02-21 MED ORDER — CHLORDIAZEPOXIDE HCL 25 MG PO CAPS
50.0000 mg | ORAL_CAPSULE | Freq: Three times a day (TID) | ORAL | Status: AC
  Administered 2012-02-22 (×3): 50 mg via ORAL
  Filled 2012-02-21 (×4): qty 1
  Filled 2012-02-21: qty 2

## 2012-02-21 MED ORDER — CHLORDIAZEPOXIDE HCL 25 MG PO CAPS
50.0000 mg | ORAL_CAPSULE | Freq: Every day | ORAL | Status: AC
  Administered 2012-02-24: 50 mg via ORAL
  Filled 2012-02-21: qty 2

## 2012-02-21 MED ORDER — LEVOTHYROXINE SODIUM 112 MCG PO TABS
112.0000 ug | ORAL_TABLET | ORAL | Status: DC
  Administered 2012-02-21 – 2012-02-24 (×4): 112 ug via ORAL
  Filled 2012-02-21 (×5): qty 1
  Filled 2012-02-21: qty 7

## 2012-02-21 MED ORDER — VITAMIN B-1 100 MG PO TABS
100.0000 mg | ORAL_TABLET | Freq: Every day | ORAL | Status: DC
  Administered 2012-02-22 – 2012-02-24 (×3): 100 mg via ORAL
  Filled 2012-02-21 (×5): qty 1

## 2012-02-21 MED ORDER — CHLORDIAZEPOXIDE HCL 25 MG PO CAPS: 25.0000 mg | ORAL_CAPSULE | Freq: Three times a day (TID) | ORAL | Status: DC

## 2012-02-21 MED ORDER — CHLORDIAZEPOXIDE HCL 25 MG PO CAPS
50.0000 mg | ORAL_CAPSULE | Freq: Four times a day (QID) | ORAL | Status: AC
  Administered 2012-02-21 (×2): 50 mg via ORAL
  Filled 2012-02-21: qty 1

## 2012-02-21 MED ORDER — MAGNESIUM HYDROXIDE 400 MG/5ML PO SUSP: 30.0000 mL | Freq: Every day | ORAL | Status: DC | PRN

## 2012-02-21 MED ORDER — PANTOPRAZOLE SODIUM 40 MG PO TBEC
40.0000 mg | DELAYED_RELEASE_TABLET | Freq: Every day | ORAL | Status: DC
  Filled 2012-02-21: qty 1

## 2012-02-21 MED ORDER — TRAZODONE HCL 50 MG PO TABS
50.0000 mg | ORAL_TABLET | Freq: Every day | ORAL | Status: DC
  Filled 2012-02-21 (×2): qty 1

## 2012-02-21 MED ORDER — FLUOXETINE HCL 20 MG PO CAPS
40.0000 mg | ORAL_CAPSULE | Freq: Every day | ORAL | Status: DC
  Administered 2012-02-21 – 2012-02-24 (×4): 40 mg via ORAL
  Filled 2012-02-21 (×4): qty 2
  Filled 2012-02-21: qty 28
  Filled 2012-02-21: qty 2

## 2012-02-21 MED ORDER — CHLORDIAZEPOXIDE HCL 25 MG PO CAPS
50.0000 mg | ORAL_CAPSULE | ORAL | Status: AC
  Administered 2012-02-23 (×2): 50 mg via ORAL
  Filled 2012-02-21 (×2): qty 2

## 2012-02-21 MED ORDER — ONDANSETRON 4 MG PO TBDP
4.0000 mg | ORAL_TABLET | Freq: Four times a day (QID) | ORAL | Status: AC | PRN
  Administered 2012-02-23: 4 mg via ORAL

## 2012-02-21 MED ORDER — HYDROXYZINE HCL 25 MG PO TABS
25.0000 mg | ORAL_TABLET | Freq: Four times a day (QID) | ORAL | Status: AC | PRN
  Administered 2012-02-21 – 2012-02-23 (×3): 25 mg via ORAL
  Filled 2012-02-21: qty 1

## 2012-02-21 MED ORDER — ADULT MULTIVITAMIN W/MINERALS CH
1.0000 | ORAL_TABLET | Freq: Every day | ORAL | Status: DC
  Administered 2012-02-24: 1 via ORAL
  Filled 2012-02-21 (×6): qty 1

## 2012-02-21 MED ORDER — LORATADINE 10 MG PO TABS
10.0000 mg | ORAL_TABLET | Freq: Every day | ORAL | Status: DC
  Administered 2012-02-21 – 2012-02-24 (×4): 10 mg via ORAL
  Filled 2012-02-21 (×2): qty 1
  Filled 2012-02-21: qty 7
  Filled 2012-02-21 (×3): qty 1

## 2012-02-21 MED ORDER — CHLORDIAZEPOXIDE HCL 25 MG PO CAPS: 25.0000 mg | ORAL_CAPSULE | ORAL | Status: DC

## 2012-02-21 MED ORDER — CHLORDIAZEPOXIDE HCL 25 MG PO CAPS
25.0000 mg | ORAL_CAPSULE | Freq: Four times a day (QID) | ORAL | Status: AC | PRN
  Administered 2012-02-21 – 2012-02-23 (×7): 25 mg via ORAL
  Filled 2012-02-21 (×10): qty 1

## 2012-02-21 MED ORDER — ALUM & MAG HYDROXIDE-SIMETH 200-200-20 MG/5ML PO SUSP: 30.0000 mL | ORAL | Status: DC | PRN

## 2012-02-21 MED ORDER — PANTOPRAZOLE SODIUM 40 MG PO TBEC
40.0000 mg | DELAYED_RELEASE_TABLET | ORAL | Status: DC
  Administered 2012-02-21 – 2012-02-24 (×7): 40 mg via ORAL
  Filled 2012-02-21 (×12): qty 1

## 2012-02-21 MED ORDER — LOPERAMIDE HCL 2 MG PO CAPS: 2.0000 mg | ORAL_CAPSULE | ORAL | Status: AC | PRN

## 2012-02-21 MED ORDER — ACETAMINOPHEN 325 MG PO TABS
650.0000 mg | ORAL_TABLET | Freq: Four times a day (QID) | ORAL | Status: DC | PRN
  Administered 2012-02-21 – 2012-02-23 (×7): 650 mg via ORAL

## 2012-02-21 MED ORDER — CHLORDIAZEPOXIDE HCL 25 MG PO CAPS: 25.0000 mg | ORAL_CAPSULE | Freq: Every day | ORAL | Status: DC

## 2012-02-21 NOTE — Progress Notes (Signed)
Patient ID: Jacqueline [name], female   DOB: Apr 17, 1966, 46 y.o.   MRN: [id number] Pt admitted to [facility] for ETOH detox and benzo abuse. Pt denies SI/HI on admission. Pt states she currently drinks 1.5 cases of beer per day for several years. Pt withdrawn and hesitant to answer questions. Pt states she lives at home with her 76 yo daughter. Pt states she is unable to work because of her addiction. No s/s of distress noted on assessment.  Pt oriented to unit and agrees to rules and regulations.

## 2012-02-21 NOTE — Tx Team (Signed)
Initial Interdisciplinary Treatment Plan  PATIENT STRENGTHS: (choose at least two) Ability for insight Capable of independent living Supportive family/friends  PATIENT STRESSORS: Financial difficulties Substance abuse   PROBLEM LIST: Problem List/Patient Goals Date to be addressed Date deferred Reason deferred Estimated date of resolution  Substance Abuse 02/21/12     Depression 02/21/12                                                DISCHARGE CRITERIA:  Ability to meet basic life and health needs Improved stabilization in mood, thinking, and/or behavior Motivation to continue treatment in a less acute level of care Need for constant or close observation no longer present Verbal commitment to aftercare and medication compliance Withdrawal symptoms are absent or subacute and managed without 24-hour nursing intervention  PRELIMINARY DISCHARGE PLAN: Attend aftercare/continuing care group Outpatient therapy Return to previous living arrangement  PATIENT/FAMIILY INVOLVEMENT: This treatment plan has been presented to and reviewed with the patient, Jacqueline [name], and/or family member.  The patient and family have been given the opportunity to ask questions and make suggestions.  Jacqueline [name] 02/21/2012, 3:28 AM

## 2012-02-21 NOTE — Progress Notes (Signed)
Patient ID: Jacqueline [name], female   DOB: 10-Jul-1966, 46 y.o.   MRN: [id number] Pt wrote on her self inventory that she need medication for sleep, appetite is poor, energy level is low, ability to pay attention is poor, 5/10 depression, 3/10 for hopelessness, complains of some withdrawal symptoms--appropriate medications given.

## 2012-02-21 NOTE — Progress Notes (Signed)
Patient refused to sign consent for [name] to contact anyone in support system. Counselor would like to contact patient's daughter with whom she lives and obtain collateral information.  Patient denied suicidal ideation and admit and continues to deny now yet does report prior history with two attempts.  [name] 02/21/2012 5:07 PM

## 2012-02-21 NOTE — H&P (Signed)
Psychiatric Admission Assessment Adult  Patient Identification:  Jacqueline [name] Date of Evaluation:  02/21/2012 Chief Complaint: Requesting detox from alcohol.  History of Present Illness::  Jacqueline [name] requests detox from alcohol. She reports that she's been drinking large amounts of beer daily for the past 5 months. She says she is motivated for change and wants to be off the alcohol. She has a history of anxiety and panic disorder with previous hospitalizations here at behavioral health. She is denying any suicidal thoughts today.  Mental status exam Fully alert female, pleasant, cooperative, complaining of a mild headache. She is in full contact with reality. Speech and thoughts are normally performed. Mood is neutral, affect appropriate. No evidence of delirium or confusion. Intelligence above average. Insight, impulse control, and judgment are adequate.  Past psychiatric history: History of previous admissions for detox, anxiety disorder and panic.  History of previous treatment for anxiety, depression, and opiate, alcohol abuse between here and [place] at various facilities.   Past Psychiatric History: see above Diagnosis:  Hospitalizations:  Outpatient Care:  Substance Abuse Care:  Self-Mutilation:  Suicidal Attempts:  Violent Behaviors:   Past Medical History:   Past Medical History  Diagnosis Date  . Peptic ulcer   . Alcohol abuse   . Anemia   . Benzodiazepine abuse   . Depression   . Anxiety   . Thyroid disease   . Alcoholism   . Narcotic abuse   . Back pain   . Thrombocytopenia 06/17/2011  . Hypothyroidism     Allergies:   Allergies  Allergen Reactions  . Nsaids Other (See Comments)    G.I. Bleed   PTA Medications: Prescriptions prior to admission  Medication Sig Dispense Refill  . sertraline (ZOLOFT) 50 MG tablet Take 1 tablet (50 mg total) by mouth daily. For depression and anxiety.  30 tablet  0  . FLUoxetine (PROZAC) 40 MG capsule Take 40 mg by mouth  daily.      Marland Kitchen lamoTRIgine (LAMICTAL) 100 MG tablet Take 100 mg by mouth daily.      Marland Kitchen levothyroxine (SYNTHROID, LEVOTHROID) 112 MCG tablet Take 1 tablet (112 mcg total) by mouth every morning. For thyroid support.  30 tablet  0  . loratadine (CLARITIN) 10 MG tablet Take 1 tablet (10 mg total) by mouth daily.  30 tablet  1  . omeprazole (PRILOSEC) 40 MG capsule Take 40 mg by mouth daily.          Previous Psychotropic Medications:  Medication/Dose                 Substance Abuse History in the last 12 months: see above Substance Age of 1st Use Last Use Amount Specific Type  Nicotine      Alcohol      Cannabis      Opiates      Cocaine      Methamphetamines      LSD      Ecstasy      Benzodiazepines      Caffeine      Inhalants      Others:                         Consequences of Substance Abuse:  Social History: Single female, has completed two years of college, currently lives alone.  Is unemployed.  Current Place of Residence:   Place of Birth:   Family Members: Marital Status:   Children:  Sons:  Daughters: Relationships:  Education:   Educational Problems/Performance: Religious Beliefs/Practices: History of Abuse (Emotional/Phsycial/Sexual) Occupational Experiences; Military History:   Legal History: Hobbies/Interests:  Family History:   Family History  Problem Relation Age of Onset  . Cancer Father   . Cancer Other     Mental Status Examination/Evaluation: see above                                              Medical Evaluation: Fully alert female, normally developed, adequately hydrated. She is in full contact with reality. Motor is normal. I have medically and physically evaluated this patient and my findings are consistent with those of the emergency room.  Diagnostic studies were done in the emergency room: Hepatic function: AST 52, ALT 60, alkaline phosphatase 69, total bilirubin 0.2. TSH: 16.835. CBC WBC 7.0,  hemoglobin 7.9. Hematocrit 36.3, platelets 607,000.  Laboratory/X-Ray Psychological Evaluation(s)      Assessment:    AXIS I: Alcohol dependence; Opiate Abuse; Depressive disorder AXIS II:  No Diagnosis AXIS III:  History of Peptic Ulcer with Recent Stool Changes Past Medical History  Diagnosis Date  . Peptic ulcer   . Alcohol abuse   . Anemia   . Benzodiazepine abuse   . Depression   . Anxiety   . Thyroid disease   . Alcoholism   . Narcotic abuse   . Back pain   . Thrombocytopenia 06/17/2011  . Hypothyroidism    AXIS IV:  Financial and social stressors AXIS V:  40  Treatment Plan Summary: Daily contact with patient to assess and evaluate symptoms and progress in treatment Medication management  Will start Librium detox protocol. Thyroid panel, anemia panel pending, will start folic acid 1 mg daily. Will start multivitamin daily. Ferrous sulfate 325 mg daily for 30 days. Stool guiac.   Current Medications:  Current Facility-Administered Medications  Medication Dose Route Frequency Provider Last Rate Last Dose  . acetaminophen (TYLENOL) tablet 650 mg  650 mg Oral Q6H PRN Jorje Guild, PA-C      . alum & mag hydroxide-simeth (MAALOX/MYLANTA) 200-200-20 MG/5ML suspension 30 mL  30 mL Oral Q4H PRN Jorje Guild, PA-C      . chlordiazePOXIDE (LIBRIUM) capsule 25 mg  25 mg Oral Q6H PRN Jorje Guild, PA-C   25 mg at 02/21/12 0955  . chlordiazePOXIDE (LIBRIUM) capsule 25 mg  25 mg Oral QID Jorje Guild, PA-C   25 mg at 02/21/12 0805   Followed by  . chlordiazePOXIDE (LIBRIUM) capsule 25 mg  25 mg Oral TID Jorje Guild, PA-C       Followed by  . chlordiazePOXIDE (LIBRIUM) capsule 25 mg  25 mg Oral BH-qamhs Jorje Guild, PA-C       Followed by  . chlordiazePOXIDE (LIBRIUM) capsule 25 mg  25 mg Oral Daily Jorje Guild, PA-C      . FLUoxetine (PROZAC) capsule 40 mg  40 mg Oral Daily Viviann Spare, NP      . hydrOXYzine (ATARAX/VISTARIL) tablet 25 mg  25 mg Oral Q6H PRN Jorje Guild, PA-C   25 mg at  02/21/12 0547  . lamoTRIgine (LAMICTAL) tablet 100 mg  100 mg Oral Daily Viviann Spare, NP      . levothyroxine (SYNTHROID, LEVOTHROID) tablet 112 mcg  112 mcg Oral BH-q7a Viviann Spare, NP      . loperamide (IMODIUM) capsule 2-4 mg  2-4 mg  Oral PRN Jorje Guild, PA-C      . loratadine (CLARITIN) tablet 10 mg  10 mg Oral Daily Viviann Spare, NP      . magnesium hydroxide (MILK OF MAGNESIA) suspension 30 mL  30 mL Oral Daily PRN Jorje Guild, PA-C      . mulitivitamin with minerals tablet 1 tablet  1 tablet Oral Daily Jorje Guild, PA-C      . nicotine (NICODERM CQ - dosed in mg/24 hours) 21 mg/24hr patch           . ondansetron (ZOFRAN-ODT) disintegrating tablet 4 mg  4 mg Oral Q6H PRN Jorje Guild, PA-C      . pantoprazole (PROTONIX) EC tablet 40 mg  40 mg Oral Q1200 Viviann Spare, NP      . thiamine (B-1) injection 100 mg  100 mg Intramuscular Once Jorje Guild, PA-C      . thiamine (VITAMIN B-1) tablet 100 mg  100 mg Oral Daily Jorje Guild, PA-C      . traZODone (DESYREL) tablet 50 mg  50 mg Oral QHS Jorje Guild, PA-C       Facility-Administered Medications Ordered in Other Encounters  Medication Dose Route Frequency Provider Last Rate Last Dose  . DISCONTD: acetaminophen (TYLENOL) tablet 650 mg  650 mg Oral Q4H PRN Scarlette Calico C. [place], [place]      . DISCONTD: FLUoxetine (PROZAC) capsule 40 mg  40 mg Oral Daily Frances C. [place], [place]      . DISCONTD: lamoTRIgine (LAMICTAL) tablet 100 mg  100 mg Oral Daily Frances C. [place], [place]      . DISCONTD: levothyroxine (SYNTHROID, LEVOTHROID) tablet 112 mcg  112 mcg Oral QAC breakfast Scarlette Calico C. [place], [place]      . DISCONTD: loratadine (CLARITIN) tablet 10 mg  10 mg Oral Daily Frances C. [place], [place]      . DISCONTD: nicotine (NICODERM CQ - dosed in mg/24 hours) patch 21 mg  21 mg Transdermal Daily Frances C. [place], [place]   21 mg at 02/20/12 2140  . DISCONTD: ondansetron (ZOFRAN) tablet 4 mg  4 mg Oral Q8H PRN Izola Price. [place], [place]      . DISCONTD: pantoprazole  (PROTONIX) EC tablet 40 mg  40 mg Oral Q1200 Frances C. [place], [place]      . DISCONTD: sertraline (ZOLOFT) tablet 50 mg  50 mg Oral Daily Frances C. [place], [place]      . DISCONTD: zolpidem (AMBIEN) tablet 5 mg  5 mg Oral QHS PRN Izola Price. [place], [place]   5 mg at 02/20/12 2140    Observation Level/Precautions:    Laboratory:    Psychotherapy:    Medications:    Routine PRN Medications:    Consultations:    Discharge Concerns:    Other:     Paysley Poplar A 5/16/201311:05 AM

## 2012-02-21 NOTE — Progress Notes (Signed)
Patient ID: Jacqueline [name], female   DOB: 1966/08/02, 46 y.o.   MRN: [id number] Pt requests anxiety medication and sleeping pill. RN informed pt that it is too late for sleep med; pt given Vistaril PRN for anxiety.

## 2012-02-21 NOTE — Progress Notes (Signed)
Patient ID: Jacqueline [name], female   DOB: 1966/09/20, 46 y.o.   MRN: [id number] Medication effective; pt resting in bed with eyes closed.

## 2012-02-21 NOTE — Progress Notes (Signed)
Pt did not attend d/c planning group on this date.  SW met with pt individually at this time.  Pt presents with disheveled appearance, flat affect and depressed mood.  Pt states that she has high anxiety and doesn't feel well today.  Pt states that her main concern today is getting back on her at home meds.  SW attempted to talk with pt later in this date and pt states she didn't feel well and didn't feel like talking today.  SW will assess for appropriate referrals.  No further needs voiced by pt at this time.    Reyes Ivan, LCSWA 02/21/2012  1:27 PM

## 2012-02-22 DIAGNOSIS — F39 Unspecified mood [affective] disorder: Secondary | ICD-10-CM

## 2012-02-22 LAB — RETICULOCYTES: Retic Count, Absolute: 98.8 10*3/uL (ref 19.0–186.0)

## 2012-02-22 LAB — CBC
HCT: 30.8 % — ABNORMAL LOW (ref 36.0–46.0)
MCV: 84.2 fL (ref 78.0–100.0)
Platelets: 582 10*3/uL — ABNORMAL HIGH (ref 150–400)
RBC: 3.66 MIL/uL — ABNORMAL LOW (ref 3.87–5.11)
WBC: 4.5 10*3/uL (ref 4.0–10.5)

## 2012-02-22 LAB — VITAMIN B12: Vitamin B-12: 249 pg/mL (ref 211–911)

## 2012-02-22 LAB — T4, FREE: Free T4: 0.67 ng/dL — ABNORMAL LOW (ref 0.80–1.80)

## 2012-02-22 LAB — IRON AND TIBC
Iron: 24 ug/dL — ABNORMAL LOW (ref 42–135)
TIBC: 472 ug/dL — ABNORMAL HIGH (ref 250–470)

## 2012-02-22 LAB — T3, FREE: T3, Free: 1.8 pg/mL — ABNORMAL LOW (ref 2.3–4.2)

## 2012-02-22 NOTE — ED Provider Notes (Signed)
Medical screening examination/treatment/procedure(s) were performed by non-physician practitioner and as supervising physician I was immediately available for consultation/collaboration.  Cheri Guppy, MD 02/22/12 [phone]

## 2012-02-22 NOTE — Progress Notes (Signed)
[facility] Group Notes:  (Counselor/Nursing/MHT/Case Management/Adjunct)  02/22/2012 3:57 PM  Type of Therapy:  Group Therapy at 11  Participation Level:  Did Not Attend  [name] 02/22/2012, 3:58 PM  [facility] Group Notes:  (Counselor/Nursing/MHT/Case Management/Adjunct)  02/22/2012 3:58 PM  Type of Therapy:  Group Therapy  Participation Level:  Minimal  Participation Quality:  Attentive  Affect:  Depressed although much improved from 02/21/12  Cognitive:  Appropriate  Insight:  Limited  Engagement in Group:  Good  Engagement in Therapy:  Limited  Modes of Intervention:  Socialization and Support  Summary of Progress/Problems:  [name] attended group session and body language showed responsiveness and agreement with much of the discussion on Post Acute Withdrawal Syndrome (PAWS). Jacqueline [name] shared little but did display facial animation and responded well to comments regarding her obviously feeling better today.  [name] 02/22/2012, 4:05 PM

## 2012-02-22 NOTE — Progress Notes (Signed)
Adult Psychosocial Assessment Update Interdisciplinary Team  Previous [facility] admissions/discharges:  Admissions Discharges  Date: 12/18/11 Date:  Date: Date:  Date: Date:  Date: Date:  Date: Date:   Changes since the last Psychosocial Assessment (including adherence to outpatient mental health and/or substance abuse treatment, situational issues contributing to decompensation and/or relapse).   Patient has had multiple admits to [facility] for same and was most recently here in March   of this year.  Patient reports she has been off her medications because she does not like the side effects yet when she goes off she usually self medicates with alcohol. Pt   Currently consuming 1 1.5 cases beer daily for during two month relapse.  She recently tried to start job at [employer] yet reports she was unable to work longer than two hours as it felt chaotic for her there.               Discharge Plan 1. Will you be returning to the same living situation after discharge?   Yes: No: UNCERTAIN     If no, what is your plan?      Patient is uncertain is she wants to or will be able to return to where she has been living with 59 YO daughter and daughter's boyfriend.        2. Would you like a referral for services when you are discharged? Yes:     If yes, for what services?  No:           Currently seen at [facility] [place].      Summary and Recommendations (to be completed by the evaluator)   Patient is 46 YO unemployed caucasian female admitted with diagnosis of mood disorder NOS and alcohol dependence.  Patient reports recent noncompliance with psychotic medications which led to beginning of two month relapse.  Patient will benefit from crisis stabilization, medication evaluation, group therapy and psycho education in addition to case management for discharge planning,.                       Signature:  [name], 02/22/2012 6:21  PM

## 2012-02-22 NOTE — Progress Notes (Signed)
Pt. Has flat affect, anxious mood.  Denies SI/HI and denies A/V hallucinations.  Reports "feeling a lot better than yesterday."  Encouragement and support given.  Pt. Receptive.

## 2012-02-22 NOTE — Progress Notes (Signed)
[facility] Group Notes:  (Counselor/Nursing/MHT/Case Management/Adjunct)  02/22/2012 3:15 PM  Type of Therapy:  Psychoeducational Skills  Participation Level:  Active  Participation Quality:  Appropriate, Sharing and Supportive  Affect:  Anxious and Appropriate  Cognitive:  Alert and Appropriate  Insight:  Good  Engagement in Group:  Good  Engagement in Therapy:  Good  Modes of Intervention:  Education  Summary of Progress/Problems:Pt attended group discussing Relapse Prevention Plan. Patients participation was good. Pt was able to share about making plans for relapse prevention before discharge and how this would be beneficial to her.     Jacqueline [name] 02/22/2012, 3:15 PM

## 2012-02-22 NOTE — Progress Notes (Signed)
Patient ID: Jacqueline [name], female   DOB: 05-01-1966, 46 y.o.   MRN: [id number]  Phone consult with Dr. Reather Littler MD regarding patient's anemia, and hypothyroidism.  He advises to continue our current plan, and no indication to increase thyroid supplement dose until she is taking steadily at least 6 weeks.  Anemia is stable and no further treatment needed at this time, she should follow-up with her primary physician.

## 2012-02-22 NOTE — Progress Notes (Signed)
Pt attended discharge planning group and actively participated.  Pt presents with calm mood and affect.  Pt denies having depression, anxiety and SI.  Pt was open with sharing reason for entering the hospital.  Pt states that she came here herself to detox from alcohol.  Pt states that she is hopeful to d/c over the weekend.  Pt states that she has a court date on Monday and has to be there for it.  Pt states that she is already established at [facility] [place] for ACTT, therapy and medication management.  SW will refer pt back to [facility]. No further needs voiced by pt at this time.  Safety planning and suicide prevention discussed.     Reyes Ivan, LCSWA 02/22/2012  10:23 AM

## 2012-02-22 NOTE — Progress Notes (Signed)
Patient ID: Jacqueline [name], female   DOB: 11-16-1965, 46 y.o.   MRN: [id number] Pt denies SI/HI/AVH.  Pt stated her sleep is fair, appetite improving, energy level low to normal, ability to pay attention improving, 2/10 on the depression scale, 1/10 on the hopelessness scale, some withdrawal sx prior to am meds for detox, now resolved.  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

## 2012-02-22 NOTE — Tx Team (Signed)
Interdisciplinary Treatment Plan Update (Adult)  Date:  02/22/2012  Time Reviewed:  11:02 AM   Progress in Treatment: Attending groups: Yes Participating in groups:  Yes Taking medication as prescribed: Yes Tolerating medication:  Yes Family/Significant othe contact made: No   Patient understands diagnosis:  Yes Discussing patient identified problems/goals with staff:  Yes Medical problems stabilized or resolved:  Yes Denies suicidal/homicidal ideation: Yes Issues/concerns per patient self-inventory:  None identified Other: N/A  New problem(s) identified: None Identified  Reason for Continuation of Hospitalization: Withdrawal symptoms Medical issues - awaiting lab results  Interventions implemented related to continuation of hospitalization: mood stabilization, medication monitoring and adjustment, group therapy and psycho education, safety checks q 15 mins  Additional comments: N/A  Estimated length of stay: 2-3 days  Discharge Plan: Pt is already established with [facility] [place] and would like to follow up there.  SW referred pt back to [facility].   New goal(s): N/A  Review of initial/current patient goals per problem list:    1.  Goal(s): Address substance use  Met:  No  Target date: by discharge  As evidenced by: completing detox protocol and refer to appropriate treatment  2.  Goal (s): Reduce depressive and anxiety symptoms  Met:  Yes  Target date: by discharge  As evidenced by: Reducing depression from a 10 to a 3 as reported by pt.  Pt denies depression and anxiety.   3.  Goal(s): Eliminate SI  Met:  Yes  Target date: by discharge  As evidenced by: Pt denies SI.     Attendees: Patient:     Family:     Physician:  Lupe Carney, DO 02/22/2012 11:02 AM   Nursing: Nanine Means, RN 02/22/2012 11:02 AM   Case Manager:  Reyes Ivan, LCSWA 02/22/2012  11:02 AM   Counselor:  Ronda Fairly, LCSWA 02/22/2012  11:02 AM     Other:  Richelle Ito, LCSW 02/22/2012 11:02 AM   Other:     Other:     Other:      Scribe for Treatment Team:   Reyes Ivan 02/22/2012 11:02 AM

## 2012-02-22 NOTE — Progress Notes (Signed)
Patient ID: Jacqueline [name], female   DOB: 01/04/1966, 46 y.o.   MRN: [id number]   Patient lying in bed. Appears to be sleeping. Respirations even and non-labored. Staff will monitor on q 15 minute checks.

## 2012-02-22 NOTE — H&P (Signed)
Pt seen and evaluated upon admission.  Completed Admission Suicide Risk Assessment.  See orders.  Pt agreeable with plan.  Discussed with team.   
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
## 2012-02-22 NOTE — Progress Notes (Signed)
[facility] Group Notes:  (Counselor/Nursing/MHT/Case Management/Adjunct)  02/22/2012 4:06 PM  Type of Therapy:  Group Therapy at 11AM and 1:15 PM on Thursday 02/21/12  Participation Level:  Did Not Attend  [name] 02/22/2012, 4:06 PM

## 2012-02-23 MED ORDER — NICOTINE 21 MG/24HR TD PT24
MEDICATED_PATCH | TRANSDERMAL | Status: AC
  Administered 2012-02-23: 09:00:00
  Filled 2012-02-23: qty 1

## 2012-02-23 NOTE — Progress Notes (Signed)
Spoke to pt and she is requesting to be D/C in the morning as planned but would like to be D/C around 10:30 AM if possible for that is when her only ride is available.

## 2012-02-23 NOTE — Progress Notes (Signed)
Patient ID: Jacqueline [name], female   DOB: 05/11/1966, 46 y.o.   MRN: [id number] Came to med window this evening before the shift started and was waiting for new staff fo request something for nausea and feeling anxious.  Was pleasant , affect was flat, color was pale, thought dinner wasn't sitting right.  Was given zofran, librium and ginger ale.  Was appreciative, when asked if she felt she was ready to be discharged tomorrow, she hesitated, but replied that she had a therapist and could do this as an outpatient.  Was quiet, polite, didn't want to engagein conversation at this time. Will continue to monitor.

## 2012-02-23 NOTE — Progress Notes (Signed)
Patient ID: [name], female   DOB: 1965-11-15, 46 y.o.   MRN: [id number]  [facility] Group Notes:  (Counselor/Nursing/MHT/Case Management/Adjunct)  02/23/2012 1:15 PM  Type of Therapy:  Group Therapy, Dance/Movement Therapy   Participation Level:  Active  Participation Quality:  Appropriate  Affect:  Appropriate  Cognitive:  Appropriate  Insight:  Good  Engagement in Group:  Good  Engagement in Therapy:  Good  Modes of Intervention:  Clarification, Problem-solving, Role-play, Socialization and Support  Summary of Progress/Problems: Therapist discussed self-sabotage and challenged group members about what their substance use was hiding or covering up. Group members reflected on the importance of getting out of bad environments, finding positive support-systems, and understanding how keeping negative influences in their lives can be destructive to their recovery. Ellawyn shared that she was feeling "orange" which indicated that she is hopeful about her discharge tomorrow.      Gevena Mart

## 2012-02-23 NOTE — Progress Notes (Signed)
S/O: Patient seen and evaluated. Chart reviewed. Patient stated that her mood was "not good". Her affect was mood congruent and constricted. She denied any current thoughts of self injurious behavior, suicidal ideation or homicidal ideation. There were no auditory or visual hallucinations, paranoia, delusional thought processes, or mania noted. Thought process was linear and goal directed. Psychomotor retardation noted. Speech was normal rate, tone and volume. Eye contact was good. Judgment and insight are fair. Patient has been up and engaged on the unit. No acute safety concerns reported from team.   A/P: Alcohol Dependence & W/D; Benzodiazepine W/D; Opioid Abuse; Mood Disorder NOS; Complicated Grief; Hx w/d seizures off benzos; Anemia; Hypothyroidism  Continue current meds and treatment plan.  Labs reviewed in details with pt and internal medicine consulted.  CBC improved.  No change in medication management indicated at this time.  Meds:    . chlordiazePOXIDE  50 mg Oral TID   Followed by  . chlordiazePOXIDE  50 mg Oral BH-qamhs   Followed by  . chlordiazePOXIDE  50 mg Oral Daily  . FLUoxetine  40 mg Oral Daily  . folic acid  1 mg Oral Daily  . lamoTRIgine  100 mg Oral Daily  . levothyroxine  112 mcg Oral BH-q7a  . loratadine  10 mg Oral Daily  . mirtazapine  15 mg Oral QHS  . mulitivitamin with minerals  1 tablet Oral Daily  . nicotine      . pantoprazole  40 mg Oral BH-qamhs  . sucralfate  1 g Oral TID WC & HS  . thiamine  100 mg Intramuscular Once  . thiamine  100 mg Oral Daily    Medication education completed.  Pros, cons, risks, potential side effects and benefits were discussed with pt.  Pt agreeable with the plan.  See orders.  Discussed with team.

## 2012-02-23 NOTE — Progress Notes (Signed)
Patient ID: Jacqueline [name], female   DOB: Mar 20, 1966, 46 y.o.   MRN: [id number] Pt. attended and participated in aftercare planning group. Pt. accepted information on suicide prevention, warning signs to look for with suicide and crisis line numbers to use. The pt. agreed to call crisis line numbers if having warning signs or having thoughts of suicide. Pt. listed their current anxiety level as low and depression as low.  Pt. indicated that she has an interest in [facility] and  will look up the phone number to follow up.  Additionally, pt. accepted an AA meeting schedule.

## 2012-02-23 NOTE — Progress Notes (Signed)
Pt has been up and has been active while in the milieu today, has been attending and participating in various milieu activities, pt did speak about substance abuse issues, has stated that she is feeling better today, has mentioned some anxious feelings today but feels ready to be discharged and is expecting discharge to happen tomorrow, pt has received all medications without incident, support provided, will continue to monitor.

## 2012-02-23 NOTE — Progress Notes (Signed)
Pt. Is resting quietly.  No signs of discomfort or distress noted.

## 2012-02-23 NOTE — Progress Notes (Signed)
  Jacqueline [name] is a 46 y.o. female [id number] 1965-10-26  02/21/2012 Principal Problem:  *Polysubstance abuse Active Problems:  Hypothyroidism  Anemia  Depression   Mental Status: Mood is good denies SI/HI/AVH.   Subjective/Objective: Anxious to discharge tomorrow. Says that she will go stay with 49 yo daughter and her BF in her student housing on [address]. She is starting college .   Filed Vitals:   02/23/12 0910  BP: 101/68  Pulse: 73  Temp:   Resp:     Lab Results:   BMET    Component Value Date/Time   NA 134* 02/20/2012 1944   K 3.9 02/20/2012 1944   CL 98 02/20/2012 1944   CO2 22 02/20/2012 1944   GLUCOSE 74 02/20/2012 1944   BUN 16 02/20/2012 1944   CREATININE 0.84 02/20/2012 1944   CALCIUM 8.6 02/20/2012 1944   GFRNONAA 84* 02/20/2012 1944   GFRAA >90 02/20/2012 1944    Medications:  Scheduled:     . chlordiazePOXIDE  50 mg Oral TID   Followed by  . chlordiazePOXIDE  50 mg Oral BH-qamhs   Followed by  . chlordiazePOXIDE  50 mg Oral Daily  . FLUoxetine  40 mg Oral Daily  . folic acid  1 mg Oral Daily  . lamoTRIgine  100 mg Oral Daily  . levothyroxine  112 mcg Oral BH-q7a  . loratadine  10 mg Oral Daily  . mirtazapine  15 mg Oral QHS  . mulitivitamin with minerals  1 tablet Oral Daily  . nicotine      . pantoprazole  40 mg Oral BH-qamhs  . sucralfate  1 g Oral TID WC & HS  . thiamine  100 mg Intramuscular Once  . thiamine  100 mg Oral Daily     PRN Meds acetaminophen, alum & mag hydroxide-simeth, chlordiazePOXIDE, hydrOXYzine, loperamide, magnesium hydroxide, ondansetron  Plan: continue current plan of care. Jacqueline [name],Jacqueline D. 02/23/2012

## 2012-02-23 NOTE — [facility] Suicide Risk Assessment (Signed)
Suicide Risk Assessment  Discharge Assessment     Demographic factors:  Caucasian;Low socioeconomic status;Unemployed    Current Mental Status Per Nursing Assessment::   On Admission:   denied si At Discharge:   denies  now  Current Mental Status Per Physician:  Patient seen and evaluated. Chart reviewed. Patient stated that her mood was "good". Her affect was mood congruent . She denied any current thoughts of self injurious behavior, suicidal ideation or homicidal ideation. There were no auditory or visual hallucinations, paranoia, delusional thought processes, or mania noted. Thought process was linear and goal directed. Psychomotor retardation noted. Speech was normal rate, tone and volume. Eye contact was good. Judgment and insight are fair. Patient has been up and engaged on the unit. No acute safety concerns reported from team.    Loss Factors: Decrease in vocational status;Financial problems / change in socioeconomic status  Historical Factors: Prior suicide attempts;Family history of suicide;Family history of mental illness or substance abuse  Risk Reduction Factors:    care for family, wants to get better  Continued Clinical Symptoms:  Alcohol/Substance Abuse/Dependencies  Discharge Diagnoses:   AXIS I:  Alcohol Dependence & W/D; Benzodiazepine W/D; Opioid Abuse; Mood Disorder NOS;   AXIS II:  Deferred AXIS III:   Past Medical History  Diagnosis Date  . Peptic ulcer   . Alcohol abuse   . Anemia   . Benzodiazepine abuse   . Depression   . Anxiety   . Thyroid disease   . Alcoholism   . Narcotic abuse   . Back pain   . Thrombocytopenia 06/17/2011  . Hypothyroidism    AXIS IV:  other psychosocial or environmental problems AXIS V:  51-60 moderate symptoms  Cognitive Features That Contribute To Risk:  Closed-mindedness    Suicide Risk:  Minimal: No identifiable suicidal ideation.  Patients presenting with no risk factors but with morbid ruminations; may be  classified as minimal risk based on the severity of the depressive symptoms  Plan Of Care/Follow-up recommendations:   Pt. indicated that she has an interest in [facility] and will look up the phone number to follow up. Additionally, pt. accepted an AA meeting schedule   Jacqueline [name] 02/23/2012, 11:08 PM

## 2012-02-24 MED ORDER — OMEPRAZOLE 40 MG PO CPDR: 40.0000 mg | DELAYED_RELEASE_CAPSULE | Freq: Every day | ORAL | Status: DC

## 2012-02-24 MED ORDER — MIRTAZAPINE 15 MG PO TABS: ORAL_TABLET | ORAL | Status: DC

## 2012-02-24 MED ORDER — LEVOTHYROXINE SODIUM 112 MCG PO TABS: 112.0000 ug | ORAL_TABLET | ORAL | Status: DC

## 2012-02-24 MED ORDER — FLUOXETINE HCL 40 MG PO CAPS: ORAL_CAPSULE | ORAL | Status: DC

## 2012-02-24 MED ORDER — LORATADINE 10 MG PO TABS: 10.0000 mg | ORAL_TABLET | Freq: Every day | ORAL | Status: DC

## 2012-02-24 MED ORDER — LAMOTRIGINE 100 MG PO TABS: ORAL_TABLET | ORAL | Status: DC

## 2012-02-24 NOTE — Discharge Summary (Signed)
Physician Discharge Summary Note  Patient:  Jacqueline [name] is an 46 y.o., female MRN:  [id number] DOB:  Mar 05, 1966 Patient phone:  [phone] (home)  Patient address:   [address] [place] [address],   Date of Admission:  02/21/2012 Date of Discharge: 02/24/2012  Reason for Admission: Detox  Discharge Diagnoses: Principal Problem:  *Polysubstance abuse Active Problems:  Hypothyroidism  Anemia  Depression   Axis Diagnosis:  AXIS I Alcohol Dependence & W/D; Benzodiazepine W/D; Opioid Abuse; Mood Disorder NOS; Complicated Grief; Hx w/d seizures off benzos   AXIS II  deferred  AXIS III Past Medical History  Diagnosis Date  . Depressed      AXIS IV problems with access to health care services and problems with primary support group  AXIS V 51-60 moderate symptoms    Level of Care:  OP  Hospital Course:  Jacqueline [name] was admitted for detox from alcohol and crisis management.  She was treated with the standard Librium protocol.  Medical problems were identified and treated.  Home medication was restarted as appropriate.     Improvement was monitored by CIWA/COWS scores and patient's daily report of withdrawal symptom reduction. Emotional and mental status was monitored by daily self inventory reports completed by the patient and clinical staff.      The patient was evaluated by the treatment team for stability and plans for continued recovery upon discharge. She was offered further treatment options upon discharge including Residential, IOP, and Outpatient treatment.  The patient's motivation was an integral factor for scheduling further treatment.  Employment, transportation, bed availability, health status, family support, and any pending legal issues were also considered.    Upon completion of detox the patient was both mentally and medically stable for discharge.    Consults:  None  Significant Diagnostic Studies:  None  Discharge Vitals:   Blood pressure 102/63, pulse  69, temperature 98.9 F (37.2 C), temperature source Oral, resp. rate 20, height 5\' 8"  (1.727 m), weight 82.555 kg (182 lb), last menstrual period 02/20/2012.  Mental Status Exam: See Mental Status Examination and Suicide Risk Assessment completed by Attending Physician prior to discharge.  Discharge destination:  Home  Is patient on multiple antipsychotic therapies at discharge:  No   Has Patient had three or more failed trials of antipsychotic monotherapy by history:  No  Recommended Plan for Multiple Antipsychotic Therapies: Not applicable  Discharge Orders    Future Orders Please Complete By Expires   Diet - low sodium heart healthy      Increase activity slowly      Discharge instructions      Comments:   Take all medications as prescribed.  Keep all follow up appointments.     Medication List  As of 02/24/2012 10:04 AM   STOP taking these medications         sertraline 50 MG tablet         TAKE these medications      Indication    FLUoxetine 40 MG capsule   Commonly known as: PROZAC   Take one capsule daily for anxiety and depression.    Indication: Excessive Use of Alcohol, Depression      lamoTRIgine 100 MG tablet   Commonly known as: LAMICTAL   Take one tablet by mouth daily for mood stabilization.    Indication: Manic-Depression      levothyroxine 112 MCG tablet   Commonly known as: SYNTHROID, LEVOTHROID   Take 1 tablet (112 mcg total) by mouth  every morning. For thyroid support.    Indication: Underactive Thyroid      loratadine 10 MG tablet   Commonly known as: CLARITIN   Take 1 tablet (10 mg total) by mouth daily. For seasonal allergies.    Indication: Hayfever      mirtazapine 15 MG tablet   Commonly known as: REMERON   Take one tablet each day at bedtime for sleep.    Indication: Trouble Sleeping      omeprazole 40 MG capsule   Commonly known as: PRILOSEC   Take 1 capsule (40 mg total) by mouth daily. For acid reflux.    Indication:  GERD-Related Laryngitis           Follow-up Information    Follow up with [facility] the [place] on 02/27/2012. (Appointment scheduled at 2:30 pm with Alona Bene)    Contact information:   [address], [place] [address] [phone]      Follow up with AA meetings. (See brochure for meeting times)          Follow-up recommendations:  Activity is unlimited, recommend daily exercise and heart healthy diet.  Comments:    Signed: Lloyd Huger T. Danis [place] PAC For Dr. Lupe Carney 02/24/2012, 10:04 AM

## 2012-02-24 NOTE — Progress Notes (Signed)
Patient ID: Jacqueline [name], female   DOB: 11-08-1965, 46 y.o.   MRN: [id number]  [facility] Group Notes:  (Counselor/Nursing/MHT/Case Management/Adjunct)  02/24/2012 1:15 PM  Type of Therapy:  Group Therapy, Dance/Movement Therapy   Did not attend.  Rhunette Croft

## 2012-02-24 NOTE — Progress Notes (Signed)
Patient ID: Jacqueline [name], female   DOB: October 01, 1966, 46 y.o.   MRN: [id number]  Pt. attended aftercare planning group. Pt. was supportive during group and was able to relate to another patient's sharing of her recovery story. Pt. Shared that she remembered times when she would cry and use at the same time because she did not want to use but she felt like she had to. Pt. shared knowldege with group about treatment programs.  Pt. collected both AA and NA meeting time schedules.

## 2012-02-24 NOTE — Progress Notes (Signed)
Patient ID: Jacqueline [name], female   DOB: [birth year], 46 y.o.   MRN: [id number]  Pt has been appropriate on the unit, she has attended groups and has engaged in treatment. Pt has been requesting to be discharged, due to a event for her daughter. Pt reported being negative SI/HI, no AH/VH noted.

## 2012-02-24 NOTE — Progress Notes (Signed)
Patient ID: Jacqueline [name], female   DOB: 01/31/1966, 46 y.o.   MRN: [id number] 02-24-12 discharge note: pt denied any si/hi/av. She got her belongings, scripts and sample medication. She stated she understood her discharge instructions.  She was escorted to the lobby. Her daughter will transport her home.

## 2012-02-24 NOTE — Progress Notes (Signed)
[facility] Case Management Discharge Plan:  Will you be returning to the same living situation after discharge: Yes,   At discharge, do you have transportation home?:Yes,   Do you have the ability to pay for your medications:Yes,    Interagency Information:     Release of information consent forms completed and in the chart;  Patient's signature needed at discharge.  Patient to Follow up at:  Follow-up Information    Follow up with [facility] of the [place] on 02/27/2012. (Appointment scheduled at 2:30 pm with Alona Bene)    Contact information:   [address], [place] [address] [phone]      Follow up with AA meetings. (See brochure for meeting times)          Patient denies SI/HI:   Yes,      Safety Planning and Suicide Prevention discussed:  Yes,    Barrier to discharge identified:No.  Summary and Recommendations:Pt was cleared for D/C and denies any and all S/I and H/I.     [facility] 02/24/2012, 11:48 AM

## 2012-02-26 NOTE — Progress Notes (Signed)
Patient Discharge Instructions:  After Visit Summary (AVS):   Faxed to:  02/25/2012 Psychiatric Admission Assessment Note:   Faxed to:  02/25/2012 Suicide Risk Assessment - Discharge Assessment:   Faxed to:  02/25/2012 Faxed/Sent to the Next Level Care provider:  02/25/2012  Faxed to [facility] the [place] @ [phone]  Wandra Scot, 02/26/2012, 10:58 AM

## 2012-03-05 ENCOUNTER — Encounter (HOSPITAL_COMMUNITY): Payer: Self-pay | Admitting: Emergency Medicine

## 2012-03-05 ENCOUNTER — Emergency Department (HOSPITAL_COMMUNITY)
Admission: EM | Admit: 2012-03-05 | Discharge: 2012-03-06 | Discharge status: Against Medical Advice | Payer: PRIVATE HEALTH INSURANCE | Attending: Emergency Medicine | Admitting: Emergency Medicine

## 2012-03-05 DIAGNOSIS — F489 Nonpsychotic mental disorder, unspecified: Secondary | ICD-10-CM | POA: Insufficient documentation

## 2012-03-05 DIAGNOSIS — F1020 Alcohol dependence, uncomplicated: Secondary | ICD-10-CM | POA: Insufficient documentation

## 2012-03-05 DIAGNOSIS — Z008 Encounter for other general examination: Secondary | ICD-10-CM | POA: Insufficient documentation

## 2012-03-05 LAB — CBC
MCH: 24.8 pg — ABNORMAL LOW (ref 26.0–34.0)
MCHC: 30.6 g/dL (ref 30.0–36.0)
RDW: 19.5 % — ABNORMAL HIGH (ref 11.5–15.5)

## 2012-03-05 LAB — RAPID URINE DRUG SCREEN, HOSP PERFORMED
Barbiturates: NOT DETECTED
Benzodiazepines: POSITIVE — AB
Cocaine: NOT DETECTED

## 2012-03-05 LAB — BASIC METABOLIC PANEL
BUN: 9 mg/dL (ref 6–23)
Calcium: 8.4 mg/dL (ref 8.4–10.5)
GFR calc Af Amer: >90 mL/min (ref >90)
GFR calc non Af Amer: >90 mL/min (ref >90)
Glucose, Bld: 76 mg/dL (ref 70–99)
Potassium: 3.8 mEq/L (ref 3.5–5.1)
Sodium: 134 mEq/L — ABNORMAL LOW (ref 135–145)

## 2012-03-05 LAB — URINALYSIS, ROUTINE W REFLEX MICROSCOPIC
Bilirubin Urine: NEGATIVE
Nitrite: NEGATIVE
Specific Gravity, Urine: 1.014 (ref 1.005–1.030)
Urobilinogen, UA: 0.2 mg/dL (ref 0.0–1.0)
pH: 5 (ref 5.0–8.0)

## 2012-03-05 LAB — ETHANOL: Alcohol, Ethyl (B): 351 mg/dL — ABNORMAL HIGH (ref 0–11)

## 2012-03-05 LAB — PREGNANCY, URINE: Preg Test, Ur: NEGATIVE

## 2012-03-05 NOTE — ED Notes (Signed)
Patient and belongings wanded by security. Patient changed into blue scrubs and red socks. One bag of belongings with one shirt, one pair of shorts, one bra, one pair of underwear, one pair of flip flops, and one black purse placed behind triage nursing station.

## 2012-03-05 NOTE — ED Notes (Signed)
Pt states she is here tonight because she wants to kill herself   Pt states she is an alcoholic  Pt states she was here a couple weeks ago for the same [employer] at bedside

## 2012-03-05 NOTE — ED Notes (Signed)
Pt not in consultation room at this time

## 2012-03-06 NOTE — ED Notes (Signed)
Patient came back to the ED to retrieve her belongings. Charge nurse Shawna Orleans made aware of situation and belongings returned to patient. One bag of belongings returned to patient.

## 2012-03-20 ENCOUNTER — Emergency Department (HOSPITAL_COMMUNITY): Admission: EM | Admit: 2012-03-20 | Discharge: 2012-03-20 | Discharge status: Against Medical Advice | Payer: Self-pay

## 2012-03-20 ENCOUNTER — Encounter (HOSPITAL_COMMUNITY): Payer: Self-pay | Admitting: *Deleted

## 2012-03-20 NOTE — ED Notes (Signed)
This writer witnessed pt changing into blue scrubs. Patient has no other clothes under scrubs. No hair ties. Security called. One pt belonging bag searched and pt wanded by security.

## 2012-03-20 NOTE — ED Notes (Signed)
Pt states "normally drink 4 liters a day, have detoxed befored, approximately 2 wks to 1 month ago"; pt requesting ativan or librium, pt informed MD will need to see.

## 2012-03-20 NOTE — ED Notes (Signed)
Pt presents with slurred speech, was escorted to [facility] & then walked out the front door, pt has been demanding ativan, female with pt now @ desk stating "I rode up here in the ambulance"

## 2012-05-05 ENCOUNTER — Telehealth (HOSPITAL_COMMUNITY): Payer: Self-pay | Admitting: Licensed Clinical Social Worker

## 2012-05-05 ENCOUNTER — Encounter (HOSPITAL_COMMUNITY): Payer: Self-pay | Admitting: Emergency Medicine

## 2012-05-05 ENCOUNTER — Emergency Department (HOSPITAL_COMMUNITY)
Admission: EM | Admit: 2012-05-05 | Discharge: 2012-05-06 | Disposition: A | Discharge status: Rehabilitation Facility | Payer: Self-pay | Attending: Emergency Medicine | Admitting: Emergency Medicine

## 2012-05-05 DIAGNOSIS — F17200 Nicotine dependence, unspecified, uncomplicated: Secondary | ICD-10-CM | POA: Insufficient documentation

## 2012-05-05 DIAGNOSIS — F1910 Other psychoactive substance abuse, uncomplicated: Secondary | ICD-10-CM | POA: Insufficient documentation

## 2012-05-05 LAB — COMPREHENSIVE METABOLIC PANEL
ALT: 21 U/L (ref 0–35)
AST: 26 U/L (ref 0–37)
Albumin: 3.9 g/dL (ref 3.5–5.2)
Alkaline Phosphatase: 91 U/L (ref 39–117)
CO2: 23 mEq/L (ref 19–32)
Chloride: 99 mEq/L (ref 96–112)
GFR calc non Af Amer: >90 mL/min (ref >90)
Potassium: 3.9 mEq/L (ref 3.5–5.1)
Sodium: 136 mEq/L (ref 135–145)
Total Bilirubin: 0.3 mg/dL (ref 0.3–1.2)

## 2012-05-05 LAB — RAPID URINE DRUG SCREEN, HOSP PERFORMED
Amphetamines: NOT DETECTED
Barbiturates: NOT DETECTED
Opiates: NOT DETECTED
Tetrahydrocannabinol: NOT DETECTED

## 2012-05-05 LAB — CBC
MCV: 86.5 fL (ref 78.0–100.0)
Platelets: 407 10*3/uL — ABNORMAL HIGH (ref 150–400)
RBC: 3.94 MIL/uL (ref 3.87–5.11)
RDW: 18.3 % — ABNORMAL HIGH (ref 11.5–15.5)
WBC: 6.4 10*3/uL (ref 4.0–10.5)

## 2012-05-05 MED ORDER — MIRTAZAPINE 30 MG PO TABS: 15.0000 mg | ORAL_TABLET | Freq: Every evening | ORAL | Status: DC | PRN

## 2012-05-05 MED ORDER — NICOTINE 21 MG/24HR TD PT24
21.0000 mg | MEDICATED_PATCH | Freq: Every day | TRANSDERMAL | Status: DC
  Administered 2012-05-05: 21 mg via TRANSDERMAL
  Filled 2012-05-05: qty 1

## 2012-05-05 MED ORDER — FLUOXETINE HCL 20 MG PO CAPS
40.0000 mg | ORAL_CAPSULE | Freq: Every day | ORAL | Status: DC
  Filled 2012-05-05: qty 2

## 2012-05-05 MED ORDER — ALUM & MAG HYDROXIDE-SIMETH 200-200-20 MG/5ML PO SUSP: 30.0000 mL | ORAL | Status: DC | PRN

## 2012-05-05 MED ORDER — LORAZEPAM 2 MG/ML IJ SOLN: 1.0000 mg | Freq: Four times a day (QID) | INTRAMUSCULAR | Status: DC | PRN

## 2012-05-05 MED ORDER — ADULT MULTIVITAMIN W/MINERALS CH
1.0000 | ORAL_TABLET | Freq: Every day | ORAL | Status: DC
  Administered 2012-05-05: 1 via ORAL
  Filled 2012-05-05: qty 1

## 2012-05-05 MED ORDER — LORATADINE 10 MG PO TABS
10.0000 mg | ORAL_TABLET | Freq: Every day | ORAL | Status: DC
  Administered 2012-05-05: 10 mg via ORAL
  Filled 2012-05-05 (×2): qty 1

## 2012-05-05 MED ORDER — ACETAMINOPHEN 325 MG PO TABS
650.0000 mg | ORAL_TABLET | ORAL | Status: DC | PRN
  Administered 2012-05-05: 650 mg via ORAL
  Filled 2012-05-05: qty 2

## 2012-05-05 MED ORDER — LORAZEPAM 1 MG PO TABS
1.0000 mg | ORAL_TABLET | Freq: Four times a day (QID) | ORAL | Status: DC | PRN
  Administered 2012-05-05 (×2): 1 mg via ORAL
  Filled 2012-05-05 (×2): qty 1

## 2012-05-05 MED ORDER — LAMOTRIGINE 25 MG PO TABS: 40.0000 mg | ORAL_TABLET | Freq: Every day | ORAL | Status: DC

## 2012-05-05 MED ORDER — VITAMIN B-1 100 MG PO TABS
100.0000 mg | ORAL_TABLET | Freq: Every day | ORAL | Status: DC
  Administered 2012-05-05: 100 mg via ORAL
  Filled 2012-05-05: qty 1

## 2012-05-05 MED ORDER — CLONAZEPAM 1 MG PO TABS
1.0000 mg | ORAL_TABLET | Freq: Two times a day (BID) | ORAL | Status: DC | PRN
  Administered 2012-05-05 (×2): 1 mg via ORAL
  Filled 2012-05-05 (×2): qty 2
  Filled 2012-05-05: qty 1

## 2012-05-05 MED ORDER — FOLIC ACID 1 MG PO TABS
1.0000 mg | ORAL_TABLET | Freq: Every day | ORAL | Status: DC
  Administered 2012-05-05: 1 mg via ORAL
  Filled 2012-05-05: qty 1

## 2012-05-05 MED ORDER — LAMOTRIGINE 100 MG PO TABS
100.0000 mg | ORAL_TABLET | Freq: Every day | ORAL | Status: DC
  Filled 2012-05-05: qty 1

## 2012-05-05 MED ORDER — THIAMINE HCL 100 MG/ML IJ SOLN: 100.0000 mg | Freq: Every day | INTRAMUSCULAR | Status: DC

## 2012-05-05 MED ORDER — FLUOXETINE HCL 40 MG PO CAPS: 20.0000 mg | ORAL_CAPSULE | Freq: Once | ORAL | Status: DC

## 2012-05-05 MED ORDER — ONDANSETRON HCL 4 MG PO TABS: 4.0000 mg | ORAL_TABLET | Freq: Three times a day (TID) | ORAL | Status: DC | PRN

## 2012-05-05 MED ORDER — LEVOTHYROXINE SODIUM 112 MCG PO TABS
112.0000 ug | ORAL_TABLET | ORAL | Status: DC
  Filled 2012-05-05 (×2): qty 1

## 2012-05-05 NOTE — BHH Counselor (Addendum)
Patient assessed and referred to [facility] for substance abuse services. Faxed information to the facility for their staff to review. Writer then called to follow up with disposition. Spoke to their staff Everlean Alstrom) and he verified that the faxed documents were received. Writer also received a faxed confirmation. Everlean Alstrom their staff member has been very busy and unable to review the faxed information. Sts that night time shift will review patient's information approx. 7pm tonight for a potential bed placement.  Contacted [facility] to see if they have beds available. Told by their staff that they may have beds however they are reviewing several referrals at this time. Told to fax patients demographics, clinicals, labs, etc.  Patient agreeable to receive services at [facility] if accepted. Sts that she will need transportation. *[facility] Authorization has not been completed at this time. Writer waiting on their staff to call back and confirm bed availability/acceptance before completing this part of the process.   Pt pending [facility] and [facility] for detox treatment.

## 2012-05-05 NOTE — ED Notes (Signed)
Pt states she wants detox from alcohol.  Denies SI/HI

## 2012-05-05 NOTE — ED Notes (Signed)
Awaiting pt's arrival to unit. 
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
## 2012-05-05 NOTE — Progress Notes (Signed)
Per Dennie Bible at [facility], pt has been accepted for detox treatment. Dennie Bible states that the [facility] driver will pick the pt up at the ED after 11pm. EDP notified and is in agreement with this disposition. RN made aware. No further needs identified at this time.

## 2012-05-05 NOTE — ED Provider Notes (Signed)
History     CSN: [id number]  Arrival date & time 05/05/12  7829   First MD Initiated Contact with Patient 05/05/12 0845      Chief Complaint  Patient presents with  . Medical Clearance    (Consider location/radiation/quality/duration/timing/severity/associated sxs/prior treatment) HPI Requesting detox from alcohol also feels that she's suffering withdrawal from Klonopin. She's requesting detox from alcohol and from Klonopin. She is presently asymptomatic. No treatment prior to coming here.  Past Medical History  Diagnosis Date  . Peptic ulcer   . Alcohol abuse   . Anemia   . Benzodiazepine abuse   . Depression   . Anxiety   . Thyroid disease   . Alcoholism   . Narcotic abuse   . Back pain   . Thrombocytopenia 06/17/2011  . Hypothyroidism     Past Surgical History  Procedure Date  . Cholecystectomy   . Abdominal surgery   . Esophagogastroduodenoscopy   . Gastric bypass   . Tubal ligation     Family History  Problem Relation Age of Onset  . Cancer Father   . Cancer Other     History  Substance Use Topics  . Smoking status: Current Everyday Smoker -- 1.0 packs/day for 10 years    Types: Cigarettes    Last Attempt to Quit: 08/08/2011  . Smokeless tobacco: Never Used  . Alcohol Use: Yes     drink 4 liters a day    OB History    Grav Para Term Preterm Abortions TAB SAB Ect Mult Living   3 3  3      3       Review of Systems  Constitutional: Negative.   HENT: Negative.   Respiratory: Negative.   Cardiovascular: Negative.   Gastrointestinal: Negative.   Musculoskeletal: Negative.   Skin: Negative.   Neurological: Negative.   Hematological: Negative.   Psychiatric/Behavioral: Negative.        Substance abuse   All other systems reviewed and are negative.    Allergies  Nsaids  Home Medications   Current Outpatient Rx  Name Route Sig Dispense Refill  . CLONAZEPAM 1 MG PO TABS Oral Take 1 mg by mouth 2 (two) times daily as needed. For anxiety.     . FLUOXETINE HCL 40 MG PO CAPS  Take one capsule daily for anxiety and depression. 30 capsule 0  . IBUPROFEN-DIPHENHYDRAMINE HCL 200-25 MG PO CAPS Oral Take 1 tablet by mouth every 8 (eight) hours as needed. For pain/sleep.    Marland Kitchen LAMOTRIGINE 100 MG PO TABS  Take one tablet by mouth daily for mood stabilization. 30 tablet 0  . LEVOTHYROXINE SODIUM 112 MCG PO TABS Oral Take 1 tablet (112 mcg total) by mouth every morning. For thyroid support. 30 tablet 0  . LORATADINE 10 MG PO TABS Oral Take 1 tablet (10 mg total) by mouth daily. For seasonal allergies. 30 tablet 1  . MIRTAZAPINE 15 MG PO TABS Oral Take 15 mg by mouth at bedtime as needed. For sleep.    Marland Kitchen OMEPRAZOLE 40 MG PO CPDR Oral Take 1 capsule (40 mg total) by mouth daily. For acid reflux. 30 capsule 0    BP 134/78  Pulse 66  Temp 99 F (37.2 C) (Oral)  Resp 20  SpO2 98%  LMP 04/21/2012  Physical Exam  Nursing note and vitals reviewed. Constitutional: She is oriented to person, place, and time. She appears well-developed and well-nourished.  HENT:  Head: Normocephalic and atraumatic.  Eyes: Conjunctivae are normal.  Pupils are equal, round, and reactive to light.  Neck: Neck supple. No tracheal deviation present. No thyromegaly present.  Cardiovascular: Normal rate and regular rhythm.   No murmur heard. Pulmonary/Chest: Effort normal and breath sounds normal.  Abdominal: Soft. Bowel sounds are normal. She exhibits no distension. There is no tenderness.  Musculoskeletal: Normal range of motion. She exhibits no edema and no tenderness.  Neurological: She is alert and oriented to person, place, and time. Coordination normal.  Skin: Skin is warm and dry. No rash noted.  Psychiatric: She has a normal mood and affect.    ED Course  Procedures (including critical care time)   Labs Reviewed  POCT PREGNANCY, URINE  CBC  COMPREHENSIVE METABOLIC PANEL  ETHANOL  URINE RAPID DRUG SCREEN (HOSP PERFORMED)   No results  found. Results for orders placed during the hospital encounter of 05/05/12  CBC      Component Value Range   WBC 6.4  4.0 - 10.5 K/uL   RBC 3.94  3.87 - 5.11 MIL/uL   Hemoglobin 11.0 (*) 12.0 - 15.0 g/dL   HCT 16.1 (*) 09.6 - 04.5 %   MCV 86.5  78.0 - 100.0 fL   MCH 27.9  26.0 - 34.0 pg   MCHC 32.3  30.0 - 36.0 g/dL   RDW 40.9 (*) 81.1 - 91.4 %   Platelets 407 (*) 150 - 400 K/uL  COMPREHENSIVE METABOLIC PANEL      Component Value Range   Sodium 136  135 - 145 mEq/L   Potassium 3.9  3.5 - 5.1 mEq/L   Chloride 99  96 - 112 mEq/L   CO2 23  19 - 32 mEq/L   Glucose, Bld 89  70 - 99 mg/dL   BUN 7  6 - 23 mg/dL   Creatinine, Ser 7.82  0.50 - 1.10 mg/dL   Calcium 9.1  8.4 - 95.6 mg/dL   Total Protein 7.4  6.0 - 8.3 g/dL   Albumin 3.9  3.5 - 5.2 g/dL   AST 26  0 - 37 U/L   ALT 21  0 - 35 U/L   Alkaline Phosphatase 91  39 - 117 U/L   Total Bilirubin 0.3  0.3 - 1.2 mg/dL   GFR calc non Af Amer >90  >90 mL/min   GFR calc Af Amer >90  >90 mL/min  ETHANOL      Component Value Range   Alcohol, Ethyl (B) 124 (*) 0 - 11 mg/dL  URINE RAPID DRUG SCREEN (HOSP PERFORMED)      Component Value Range   Opiates NONE DETECTED  NONE DETECTED   Cocaine NONE DETECTED  NONE DETECTED   Benzodiazepines NONE DETECTED  NONE DETECTED   Amphetamines NONE DETECTED  NONE DETECTED   Tetrahydrocannabinol NONE DETECTED  NONE DETECTED   Barbiturates NONE DETECTED  NONE DETECTED  POCT PREGNANCY, URINE      Component Value Range   Preg Test, Ur NEGATIVE  NEGATIVE   No results found.  No diagnosis found.    MDM  ACT team consult and to assist in finding patient counseling and or detox 4 substance abuse Diagnosis polysubstance abuse        Doug Sou, MD 05/05/12 1615

## 2012-05-05 NOTE — ED Notes (Addendum)
Pt presenting to ed with c/o needing detox from alcohol and clonopin. Pt states she last drank at 7:00am this morning pt states she usually drinks about 3 Liters of wine daily. Pt states she is up to taking clonopin 3mg  daily. Pt is alert and oriented at this time. Pt denies SI/HI at this time

## 2012-05-05 NOTE — ED Notes (Addendum)
Pt states "I want to go to [facility], out of klonopin, gave 10 of them away to someone going through [facility] and sometimes I took more than I was supposed to, took last klonopin Thursday or Friday; [facility] writes for my klonopin and they won't give me any more until it's due"

## 2012-05-15 ENCOUNTER — Emergency Department (HOSPITAL_COMMUNITY)
Admission: EM | Admit: 2012-05-15 | Discharge: 2012-05-15 | Disposition: A | Discharge status: Home / Self Care | Payer: Self-pay | Attending: Emergency Medicine | Admitting: Emergency Medicine

## 2012-05-15 ENCOUNTER — Encounter (HOSPITAL_COMMUNITY): Payer: Self-pay | Admitting: Emergency Medicine

## 2012-05-15 DIAGNOSIS — Z760 Encounter for issue of repeat prescription: Secondary | ICD-10-CM | POA: Insufficient documentation

## 2012-05-15 DIAGNOSIS — F17200 Nicotine dependence, unspecified, uncomplicated: Secondary | ICD-10-CM | POA: Insufficient documentation

## 2012-05-15 DIAGNOSIS — D649 Anemia, unspecified: Secondary | ICD-10-CM | POA: Insufficient documentation

## 2012-05-15 DIAGNOSIS — E039 Hypothyroidism, unspecified: Secondary | ICD-10-CM | POA: Insufficient documentation

## 2012-05-15 MED ORDER — LEVOTHYROXINE SODIUM 112 MCG PO TABS: 112.0000 ug | ORAL_TABLET | Freq: Every day | ORAL | Status: DC

## 2012-05-15 NOTE — ED Provider Notes (Signed)
History     CSN: [id number]  Arrival date & time 05/15/12  2130   First MD Initiated Contact with Patient 05/15/12 [phone]      Chief Complaint  Patient presents with  . Medication Refill    (Consider location/radiation/quality/duration/timing/severity/associated sxs/prior treatment) The history is provided by the patient and medical records.   Jacqueline [name] is a 46 y.o. female presents to the emergency department c/o needing a Rx refill for synthroid.  She last took the medication on Saturday.  She states she can hopefully see her PCP on Tuesday for a refill and continued monitoring.  Her only complaint at this time is fatigue for 3 days.  She has all her other medications at home. She denies fever, chills, diaphoresis, chest pain, shortness of breath, nausea, vomiting, diarrhea, palpitations, swelling of her legs or arms.  She has not tried anything for the symptoms.  Nothing makes symptoms better or worse.     Past Medical History  Diagnosis Date  . Peptic ulcer   . Alcohol abuse   . Anemia   . Benzodiazepine abuse   . Depression   . Anxiety   . Thyroid disease   . Alcoholism   . Narcotic abuse   . Back pain   . Thrombocytopenia 06/17/2011  . Hypothyroidism     Past Surgical History  Procedure Date  . Cholecystectomy   . Abdominal surgery   . Esophagogastroduodenoscopy   . Gastric bypass   . Tubal ligation     Family History  Problem Relation Age of Onset  . Cancer Father   . Cancer Other     History  Substance Use Topics  . Smoking status: Current Everyday Smoker -- 1.0 packs/day for 10 years    Types: Cigarettes    Last Attempt to Quit: 08/08/2011  . Smokeless tobacco: Never Used  . Alcohol Use: Yes     drink 4 liters a day    OB History    Grav Para Term Preterm Abortions TAB SAB Ect Mult Living   3 3  3      3       Review of Systems  Constitutional: Positive for fatigue. Negative for fever, diaphoresis, appetite change and unexpected weight  change.  HENT: Negative for mouth sores.   Eyes: Negative for visual disturbance.  Respiratory: Negative for cough, chest tightness, shortness of breath and wheezing.   Cardiovascular: Negative for chest pain, palpitations and leg swelling.  Gastrointestinal: Negative for nausea, vomiting, abdominal pain, diarrhea and constipation.  Genitourinary: Negative for dysuria, urgency, frequency and hematuria.  Musculoskeletal: Negative for back pain.  Skin: Negative for rash.  Neurological: Negative for syncope, light-headedness and headaches.  Hematological: Does not bruise/bleed easily.    Allergies  Nsaids  Home Medications   Current Outpatient Rx  Name Route Sig Dispense Refill  . CLONAZEPAM 1 MG PO TABS Oral Take 1 mg by mouth 2 (two) times daily as needed. For anxiety.    . FEROSUL PO Oral Take 325 mg by mouth daily.    Marland Kitchen LORATADINE 10 MG PO TABS Oral Take 1 tablet (10 mg total) by mouth daily. For seasonal allergies. 30 tablet 1  . OMEPRAZOLE 20 MG PO CPDR Oral Take 40 mg by mouth daily.    . SERTRALINE HCL 100 MG PO TABS Oral Take 100 mg by mouth daily.    Marland Kitchen LEVOTHYROXINE SODIUM 112 MCG PO TABS Oral Take 1 tablet (112 mcg total) by mouth every morning. For thyroid  support. 30 tablet 0    BP 110/75  Pulse 84  Temp 98.3 F (36.8 C) (Oral)  Resp 18  Ht 5\' 9"  (1.753 m)  Wt 170 lb (77.111 kg)  BMI 25.10 kg/m2  SpO2 100%  LMP 04/21/2012  Physical Exam  Nursing note and vitals reviewed. Constitutional: She appears well-developed and well-nourished. No distress.  HENT:  Head: Normocephalic and atraumatic.  Eyes: Conjunctivae are normal. No scleral icterus.  Neck: Normal range of motion. Neck supple. No thyromegaly present.  Cardiovascular: Normal rate, regular rhythm, normal heart sounds and intact distal pulses.  Exam reveals no gallop and no friction rub.   No murmur heard. Pulmonary/Chest: Effort normal and breath sounds normal. No respiratory distress. She has no wheezes.   Abdominal: Soft. Bowel sounds are normal. She exhibits no mass. There is no tenderness. There is no rebound and no guarding.  Musculoskeletal: Normal range of motion. She exhibits no edema.  Neurological: She is alert.       Speech is clear and goal oriented Moves extremities without ataxia  Skin: Skin is warm and dry. She is not diaphoretic.  Psychiatric: She has a normal mood and affect.    ED Course  Procedures (including critical care time)  Labs Reviewed - No data to display No results found.   1. Medication refill       MDM  Jacqueline [name] presents for medication refill of Synthroid.  She is a healthy nontoxic-appearing female.  I will write for one-month supply of Synthroid.  Patient is advised she needs to continue to see her primary care physician for follow-up, medication monitoring and medication refill.  1. Medications: levothyroxine 112 mcg PO QD 2. Treatment: take medications as prescribed 3. Follow Up: with primary care physician for continued monitoring and refills.           Dahlia Client Surah Pelley, PA-C 05/15/12 [phone]

## 2012-05-15 NOTE — ED Notes (Signed)
PA at bedside for eval.

## 2012-05-15 NOTE — ED Provider Notes (Signed)
Medical screening examination/treatment/procedure(s) were performed by non-physician practitioner and as supervising physician I was immediately available for consultation/collaboration.   Lyanne Co, MD 05/15/12 [phone]

## 2012-05-15 NOTE — ED Notes (Signed)
Patient states she lost her prescription for Levothyroxine 112 mcg and hasn't taken it in 3 days.  Patient states she cannot get an appt with her PCP and needs a month's supply.

## 2012-06-17 ENCOUNTER — Emergency Department (HOSPITAL_COMMUNITY)
Admission: EM | Admit: 2012-06-17 | Discharge: 2012-06-17 | Discharge status: Against Medical Advice | Payer: Self-pay | Attending: Emergency Medicine | Admitting: Emergency Medicine

## 2012-06-17 ENCOUNTER — Emergency Department (HOSPITAL_COMMUNITY)
Admission: EM | Admit: 2012-06-17 | Discharge: 2012-06-18 | Discharge status: Against Medical Advice | Payer: Self-pay | Attending: Emergency Medicine | Admitting: Emergency Medicine

## 2012-06-17 DIAGNOSIS — F1020 Alcohol dependence, uncomplicated: Secondary | ICD-10-CM | POA: Insufficient documentation

## 2012-06-17 DIAGNOSIS — Z0389 Encounter for observation for other suspected diseases and conditions ruled out: Secondary | ICD-10-CM | POA: Insufficient documentation

## 2012-06-17 NOTE — ED Notes (Signed)
No answer in waiting room 

## 2012-06-17 NOTE — ED Notes (Signed)
No answer in waiting area, pt notified after leaving the first time to stay in waiting area until called. Pt no where to be found

## 2012-06-17 NOTE — ED Notes (Signed)
Pt not in waiting area when called for triage, no answer.

## 2012-06-18 ENCOUNTER — Emergency Department (HOSPITAL_COMMUNITY)
Admission: EM | Admit: 2012-06-18 | Discharge: 2012-06-20 | Disposition: A | Discharge status: Home / Self Care | Payer: Self-pay | Attending: Emergency Medicine | Admitting: Emergency Medicine

## 2012-06-18 ENCOUNTER — Encounter (HOSPITAL_COMMUNITY): Payer: Self-pay | Admitting: *Deleted

## 2012-06-18 DIAGNOSIS — F329 Major depressive disorder, single episode, unspecified: Secondary | ICD-10-CM | POA: Insufficient documentation

## 2012-06-18 DIAGNOSIS — Z79899 Other long term (current) drug therapy: Secondary | ICD-10-CM | POA: Insufficient documentation

## 2012-06-18 DIAGNOSIS — F411 Generalized anxiety disorder: Secondary | ICD-10-CM | POA: Insufficient documentation

## 2012-06-18 DIAGNOSIS — F3289 Other specified depressive episodes: Secondary | ICD-10-CM | POA: Insufficient documentation

## 2012-06-18 DIAGNOSIS — E039 Hypothyroidism, unspecified: Secondary | ICD-10-CM | POA: Insufficient documentation

## 2012-06-18 DIAGNOSIS — F1010 Alcohol abuse, uncomplicated: Secondary | ICD-10-CM | POA: Insufficient documentation

## 2012-06-18 LAB — CBC
HCT: 33.3 % — ABNORMAL LOW (ref 36.0–46.0)
Hemoglobin: 11 g/dL — ABNORMAL LOW (ref 12.0–15.0)
MCHC: 33 g/dL (ref 30.0–36.0)
RBC: 3.66 MIL/uL — ABNORMAL LOW (ref 3.87–5.11)

## 2012-06-18 LAB — COMPREHENSIVE METABOLIC PANEL
ALT: 17 U/L (ref 0–35)
Alkaline Phosphatase: 76 U/L (ref 39–117)
BUN: 8 mg/dL (ref 6–23)
CO2: 27 mEq/L (ref 19–32)
Chloride: 98 mEq/L (ref 96–112)
GFR calc Af Amer: >90 mL/min (ref >90)
GFR calc non Af Amer: >90 mL/min (ref >90)
Glucose, Bld: 91 mg/dL (ref 70–99)
Potassium: 3.6 mEq/L (ref 3.5–5.1)
Sodium: 137 mEq/L (ref 135–145)
Total Bilirubin: 0.2 mg/dL — ABNORMAL LOW (ref 0.3–1.2)
Total Protein: 6.5 g/dL (ref 6.0–8.3)

## 2012-06-18 LAB — RAPID URINE DRUG SCREEN, HOSP PERFORMED
Cocaine: POSITIVE — AB
Opiates: NOT DETECTED

## 2012-06-18 LAB — ETHANOL: Alcohol, Ethyl (B): 161 mg/dL — ABNORMAL HIGH (ref 0–11)

## 2012-06-18 MED ORDER — LORAZEPAM 2 MG/ML IJ SOLN: 1.0000 mg | Freq: Four times a day (QID) | INTRAMUSCULAR | Status: DC | PRN

## 2012-06-18 MED ORDER — FOLIC ACID 1 MG PO TABS
1.0000 mg | ORAL_TABLET | Freq: Every day | ORAL | Status: DC
  Administered 2012-06-18 – 2012-06-20 (×3): 1 mg via ORAL
  Filled 2012-06-18 (×3): qty 1

## 2012-06-18 MED ORDER — ACETAMINOPHEN 325 MG PO TABS
650.0000 mg | ORAL_TABLET | ORAL | Status: DC | PRN
  Administered 2012-06-18 – 2012-06-19 (×3): 650 mg via ORAL
  Filled 2012-06-18 (×3): qty 2

## 2012-06-18 MED ORDER — LORATADINE 10 MG PO TABS
10.0000 mg | ORAL_TABLET | Freq: Every day | ORAL | Status: DC
  Administered 2012-06-18 – 2012-06-20 (×3): 10 mg via ORAL
  Filled 2012-06-18 (×3): qty 1

## 2012-06-18 MED ORDER — IBUPROFEN 600 MG PO TABS: 600.0000 mg | ORAL_TABLET | Freq: Three times a day (TID) | ORAL | Status: DC | PRN

## 2012-06-18 MED ORDER — CLONAZEPAM 1 MG PO TABS
1.0000 mg | ORAL_TABLET | Freq: Two times a day (BID) | ORAL | Status: DC | PRN
  Administered 2012-06-18 – 2012-06-20 (×4): 1 mg via ORAL
  Filled 2012-06-18 (×4): qty 1
  Filled 2012-06-18: qty 2

## 2012-06-18 MED ORDER — THIAMINE HCL 100 MG/ML IJ SOLN: 100.0000 mg | Freq: Every day | INTRAMUSCULAR | Status: DC

## 2012-06-18 MED ORDER — ZOLPIDEM TARTRATE 5 MG PO TABS
5.0000 mg | ORAL_TABLET | Freq: Every evening | ORAL | Status: DC | PRN
  Administered 2012-06-19: 5 mg via ORAL
  Filled 2012-06-18: qty 1

## 2012-06-18 MED ORDER — NICOTINE 21 MG/24HR TD PT24
21.0000 mg | MEDICATED_PATCH | Freq: Every day | TRANSDERMAL | Status: DC
  Administered 2012-06-18 – 2012-06-20 (×3): 21 mg via TRANSDERMAL
  Filled 2012-06-18 (×3): qty 1

## 2012-06-18 MED ORDER — ALUM & MAG HYDROXIDE-SIMETH 200-200-20 MG/5ML PO SUSP
30.0000 mL | ORAL | Status: DC | PRN
  Filled 2012-06-18: qty 30

## 2012-06-18 MED ORDER — VITAMIN B-1 100 MG PO TABS
100.0000 mg | ORAL_TABLET | Freq: Every day | ORAL | Status: DC
  Administered 2012-06-18 – 2012-06-20 (×3): 100 mg via ORAL
  Filled 2012-06-18 (×3): qty 1

## 2012-06-18 MED ORDER — SERTRALINE HCL 50 MG PO TABS
100.0000 mg | ORAL_TABLET | Freq: Every day | ORAL | Status: DC
  Administered 2012-06-18: 100 mg via ORAL
  Administered 2012-06-19: 50 mg via ORAL
  Administered 2012-06-20: 100 mg via ORAL
  Filled 2012-06-18 (×2): qty 2
  Filled 2012-06-18: qty 1

## 2012-06-18 MED ORDER — LORAZEPAM 1 MG PO TABS
1.0000 mg | ORAL_TABLET | Freq: Four times a day (QID) | ORAL | Status: DC | PRN
  Administered 2012-06-18 – 2012-06-20 (×8): 1 mg via ORAL
  Filled 2012-06-18 (×8): qty 1

## 2012-06-18 MED ORDER — PANTOPRAZOLE SODIUM 40 MG PO TBEC
40.0000 mg | DELAYED_RELEASE_TABLET | Freq: Every day | ORAL | Status: DC
  Administered 2012-06-18 – 2012-06-20 (×3): 40 mg via ORAL
  Filled 2012-06-18 (×3): qty 1

## 2012-06-18 MED ORDER — LEVOTHYROXINE SODIUM 112 MCG PO TABS
112.0000 ug | ORAL_TABLET | Freq: Every day | ORAL | Status: DC
  Administered 2012-06-18 – 2012-06-20 (×3): 112 ug via ORAL
  Filled 2012-06-18 (×3): qty 1

## 2012-06-18 MED ORDER — ADULT MULTIVITAMIN W/MINERALS CH
1.0000 | ORAL_TABLET | Freq: Every day | ORAL | Status: DC
  Administered 2012-06-18 – 2012-06-19 (×2): 1 via ORAL
  Filled 2012-06-18 (×4): qty 1

## 2012-06-18 MED ORDER — ONDANSETRON HCL 4 MG PO TABS
4.0000 mg | ORAL_TABLET | Freq: Three times a day (TID) | ORAL | Status: DC | PRN
  Administered 2012-06-20: 4 mg via ORAL
  Filled 2012-06-18: qty 1

## 2012-06-18 NOTE — ED Notes (Signed)
Pt states she wants to detox off ETOH, Last drink 7pm pt has been here 2 other times tonight and left PTT

## 2012-06-18 NOTE — ED Provider Notes (Signed)
Medical screening examination/treatment/procedure(s) were performed by non-physician practitioner and as supervising physician I was immediately available for consultation/collaboration.   Richardean Canal, MD 06/18/12 [phone]

## 2012-06-18 NOTE — ED Notes (Signed)
Patient and belongings wanded by security. Patient placed in blue paper scrubs and red socks.

## 2012-06-18 NOTE — ED Notes (Signed)
Report given to [place], Charity fundraiser. Transferred to rm 25
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
## 2012-06-18 NOTE — ED Notes (Signed)
Pt alert and oriented x4. Respirations even and unlabored, bilateral symmetrical rise and fall of chest. Skin warm and dry. In no acute distress. Denies needs.   

## 2012-06-18 NOTE — ED Provider Notes (Signed)
History     CSN: [id number]  Arrival date & time 06/18/12  0250   First MD Initiated Contact with Patient 06/18/12 [phone]      Chief Complaint  Patient presents with  . Medical Clearance    (Consider location/radiation/quality/duration/timing/severity/associated sxs/prior treatment) HPI She does here voluntarily for detox from alcohol. Her last drink was at 7 PM tonight the patient has been here multiple times today and has left prior to being seen. She denies any suicidal or homicidal ideations and the patient is currently intoxicated. She is also hemodynamically stable.   Past Medical History  Diagnosis Date  . Peptic ulcer   . Alcohol abuse   . Anemia   . Benzodiazepine abuse   . Depression   . Anxiety   . Thyroid disease   . Alcoholism   . Narcotic abuse   . Back pain   . Thrombocytopenia 06/17/2011  . Hypothyroidism     Past Surgical History  Procedure Date  . Cholecystectomy   . Abdominal surgery   . Esophagogastroduodenoscopy   . Gastric bypass   . Tubal ligation     Family History  Problem Relation Age of Onset  . Cancer Father   . Cancer Other     History  Substance Use Topics  . Smoking status: Current Every Day Smoker -- 1.0 packs/day for 10 years    Types: Cigarettes    Last Attempt to Quit: 08/08/2011  . Smokeless tobacco: Never Used  . Alcohol Use: Yes     drink 4 liters a day    OB History    Grav Para Term Preterm Abortions TAB SAB Ect Mult Living   3 3  3      3       Review of Systems   Review of Systems  Gen: no weight loss, fevers, chills, night sweats  Eyes: no discharge or drainage, no occular pain or visual changes  Nose: no epistaxis or rhinorrhea  Mouth: no dental pain, no sore throat  Neck: no neck pain  Lungs:No wheezing, coughing or hemoptysis CV: no chest pain, palpitations, dependent edema or orthopnea  Abd: no abdominal pain, nausea, vomiting  GU: no dysuria or gross hematuria  MSK:  No abnormalities  Neuro: no  headache, no focal neurologic deficits  Skin: no abnormalities Psyche: intoxicated and depressed    Allergies  Nsaids  Home Medications   Current Outpatient Rx  Name Route Sig Dispense Refill  . CLONAZEPAM 1 MG PO TABS Oral Take 1 mg by mouth 2 (two) times daily as needed. For anxiety.    . FEROSUL PO Oral Take 325 mg by mouth daily.    Marland Kitchen LEVOTHYROXINE SODIUM 112 MCG PO TABS Oral Take 1 tablet (112 mcg total) by mouth daily. 30 tablet 1  . LORATADINE 10 MG PO TABS Oral Take 1 tablet (10 mg total) by mouth daily. For seasonal allergies. 30 tablet 1  . OMEPRAZOLE 20 MG PO CPDR Oral Take 40 mg by mouth daily.    . SERTRALINE HCL 100 MG PO TABS Oral Take 100 mg by mouth daily.      BP 123/76  Pulse 97  Temp 97.8 F (36.6 C) (Oral)  Resp 18  SpO2 96%  Physical Exam  Nursing note and vitals reviewed. Constitutional: She appears well-developed and well-nourished. No distress.  HENT:  Head: Normocephalic and atraumatic.  Eyes: Pupils are equal, round, and reactive to light.  Neck: Normal range of motion. Neck supple.  Cardiovascular: Normal  rate and regular rhythm.   Pulmonary/Chest: Effort normal.  Neurological: She is alert.  Skin: Skin is warm and dry.  Psychiatric: She expresses no homicidal and no suicidal ideation. She expresses no suicidal plans and no homicidal plans.       Pt intoxicated    ED Course  Procedures (including critical care time)  Labs Reviewed  CBC - Abnormal; Notable for the following:    WBC 2.4 (*)     RBC 3.66 (*)     Hemoglobin 11.0 (*)     HCT 33.3 (*)     RDW 18.7 (*)     All other components within normal limits  COMPREHENSIVE METABOLIC PANEL - Abnormal; Notable for the following:    Calcium 8.1 (*)     Albumin 3.2 (*)     Total Bilirubin 0.2 (*)     All other components within normal limits  ETHANOL - Abnormal; Notable for the following:    Alcohol, Ethyl (B) 161 (*)     All other components within normal limits  URINE RAPID DRUG  SCREEN (HOSP PERFORMED) - Abnormal; Notable for the following:    Cocaine POSITIVE (*)     All other components within normal limits  ACETAMINOPHEN LEVEL  POCT PREGNANCY, URINE   No results found.   1. Alcohol abuse       MDM  Medically cleared. Will consult ACT to evaluate. Holding orders and alcohol detox orders placed        Dorthula Matas, [place] 06/18/12 [phone]

## 2012-06-19 NOTE — ED Notes (Signed)
Offered to move pt to room with working tv.  Pt states she is fine where she is.

## 2012-06-19 NOTE — ED Provider Notes (Signed)
Pt resting comfortably. No complaints. Awaiting placement for EtOH, referred to [facility].   Charles B. Bernette Mayers, MD 06/19/12 0800

## 2012-06-19 NOTE — ED Notes (Addendum)
Patient reports that she needs something for rest. Patient will be medicated per MAR.

## 2012-06-20 DIAGNOSIS — F1020 Alcohol dependence, uncomplicated: Secondary | ICD-10-CM

## 2012-06-20 NOTE — ED Notes (Signed)
Report received-airway intact-no s/s's of distress-will continue to monitor 

## 2012-06-20 NOTE — ED Provider Notes (Signed)
Pt stable awaiting placement  Jacqueline [name] L Jacqueline Fitzner, MD 06/20/12 0701 

## 2012-06-20 NOTE — ED Notes (Signed)
Patient states she will stay with daughter, who does not drink, and make an appointment with [facility] for an  ETOH assessment-pt states she feels comfortable with this plan, daughter involved as well

## 2012-06-20 NOTE — Consult Note (Signed)
Reason for Consult: Alcohol dependence Referring Physician: Dr. Remi Deter is an 46 y.o. female.  HPI: Patient was seen and chart reviewed. Patient came for the alcohol detox treatment and received Ativan and Klonopin for detox treatment along with supportive treatment. Patient was unable to be placed in inpatient detox services at this time. Patient stated that she was feeling much better during the last 72 hours and feels she can go to the rehabilitation services at this time. She has long history of for alcohol dependence, intoxication, and the numerous detox treatments. Patient reported her boyfriend, who is a Corporate investment banker support for her alcohol . Reportedly she was separated from him and staying with her 41 years old daughter who is a at [employer]. Patient was not getting along with her daughter because of her alcoholism and regular basis. Her mother who lives in [place], does not want her to live with and she does not even think about her 87 years old son, who lives in [place]. Her mother cares for the patient's 38 years old daughter. Patient has no reported the physical withdrawal symptoms during this visit. Patient continued to have a mild anxiety and required medication this morning. Patient urine drug screen was positive for cocaine on arrival and alcohol blood level was 161 mcg/dL.  Past Medical History  Diagnosis Date  . Peptic ulcer   . Alcohol abuse   . Anemia   . Benzodiazepine abuse   . Depression   . Anxiety   . Thyroid disease   . Alcoholism   . Narcotic abuse   . Back pain   . Thrombocytopenia 06/17/2011  . Hypothyroidism     Past Surgical History  Procedure Date  . Cholecystectomy   . Abdominal surgery   . Esophagogastroduodenoscopy   . Gastric bypass   . Tubal ligation     Family History  Problem Relation Age of Onset  . Cancer Father   . Cancer Other     Social History:  reports that she has been smoking Cigarettes.  She has a 10 pack-year  smoking history. She has never used smokeless tobacco. She reports that she drinks alcohol. She reports that she uses illicit drugs (Oxycodone and Cocaine).  Allergies:  Allergies  Allergen Reactions  . Nsaids Other (See Comments)    G.I. Bleed    Medications: I have reviewed the patient's current medications.  No results found for this or any previous visit (from the past 48 hour(s)).  No results found.  Positive for anxiety, bad mood, excessive alcohol consumption, illegal drug usage and sleep disturbance Blood pressure 129/76, pulse 88, temperature 98.4 F (36.9 C), temperature source Oral, resp. rate 20, SpO2 97.00%.   Assessment/Plan: Patient will be referred to the alcohol and substance abuse rehabilitation services at [facility] recovery services. Patient has received the Ativan detox treatment for alcohol withdrawal symptoms.  Marcia Lepera,JANARDHAHA R. 06/20/2012, 11:49 AM

## 2012-06-20 NOTE — BHH Counselor (Signed)
Per psychiatrist pt is clear to be d/c with OPT follow up and to the charge of care of her daughter. Pt is agreeable. Updated EDP who is agreeable. Provided information for [facility] and [facility] to pt.

## 2012-06-20 NOTE — ED Notes (Signed)
Psyche MD at bedside.

## 2012-06-20 NOTE — ED Notes (Signed)
Patient states she can stay with daughter until she gets into rehab-encouraged patient to get involved with AA, for support and guadance

## 2012-07-09 ENCOUNTER — Encounter (HOSPITAL_COMMUNITY): Payer: Self-pay | Admitting: Adult Health

## 2012-07-09 ENCOUNTER — Emergency Department (HOSPITAL_COMMUNITY)
Admission: EM | Admit: 2012-07-09 | Discharge: 2012-07-10 | Disposition: A | Discharge status: Other Acute Inpatient Hospital | Payer: Self-pay | Attending: Emergency Medicine | Admitting: Emergency Medicine

## 2012-07-09 DIAGNOSIS — Z79899 Other long term (current) drug therapy: Secondary | ICD-10-CM | POA: Insufficient documentation

## 2012-07-09 DIAGNOSIS — E039 Hypothyroidism, unspecified: Secondary | ICD-10-CM | POA: Insufficient documentation

## 2012-07-09 DIAGNOSIS — F329 Major depressive disorder, single episode, unspecified: Secondary | ICD-10-CM | POA: Insufficient documentation

## 2012-07-09 DIAGNOSIS — F1010 Alcohol abuse, uncomplicated: Secondary | ICD-10-CM | POA: Insufficient documentation

## 2012-07-09 DIAGNOSIS — F17200 Nicotine dependence, unspecified, uncomplicated: Secondary | ICD-10-CM | POA: Insufficient documentation

## 2012-07-09 DIAGNOSIS — F3289 Other specified depressive episodes: Secondary | ICD-10-CM | POA: Insufficient documentation

## 2012-07-09 DIAGNOSIS — K219 Gastro-esophageal reflux disease without esophagitis: Secondary | ICD-10-CM | POA: Insufficient documentation

## 2012-07-09 LAB — COMPREHENSIVE METABOLIC PANEL WITH GFR
ALT: 37 U/L — ABNORMAL HIGH (ref 0–35)
AST: 68 U/L — ABNORMAL HIGH (ref 0–37)
Albumin: 4 g/dL (ref 3.5–5.2)
Alkaline Phosphatase: 78 U/L (ref 39–117)
BUN: 7 mg/dL (ref 6–23)
CO2: 22 meq/L (ref 19–32)
Calcium: 9 mg/dL (ref 8.4–10.5)
Chloride: 99 meq/L (ref 96–112)
Creatinine, Ser: 0.99 mg/dL (ref 0.50–1.10)
GFR calc Af Amer: 79 mL/min — ABNORMAL LOW (ref >90)
GFR calc non Af Amer: 68 mL/min — ABNORMAL LOW (ref >90)
Glucose, Bld: 93 mg/dL (ref 70–99)
Potassium: 4.1 meq/L (ref 3.5–5.1)
Sodium: 136 meq/L (ref 135–145)
Total Bilirubin: 0.1 mg/dL — ABNORMAL LOW (ref 0.3–1.2)
Total Protein: 7.8 g/dL (ref 6.0–8.3)

## 2012-07-09 LAB — CBC
HCT: 35.1 % — ABNORMAL LOW (ref 36.0–46.0)
MCH: 32 pg (ref 26.0–34.0)
MCHC: 33.3 g/dL (ref 30.0–36.0)
MCV: 95.9 fL (ref 78.0–100.0)
RDW: 18.8 % — ABNORMAL HIGH (ref 11.5–15.5)

## 2012-07-09 LAB — SALICYLATE LEVEL: Salicylate Lvl: <2 mg/dL — ABNORMAL LOW (ref 2.8–20.0)

## 2012-07-09 LAB — ETHANOL: Alcohol, Ethyl (B): 351 mg/dL — ABNORMAL HIGH (ref 0–11)

## 2012-07-09 MED ORDER — LORAZEPAM 2 MG/ML IJ SOLN: 1.0000 mg | Freq: Once | INTRAMUSCULAR | Status: DC

## 2012-07-09 MED ORDER — LORAZEPAM 1 MG PO TABS
2.0000 mg | ORAL_TABLET | Freq: Once | ORAL | Status: AC
  Administered 2012-07-09: 2 mg via ORAL
  Filled 2012-07-09: qty 2

## 2012-07-09 NOTE — ED Notes (Signed)
Requesting detox from alcohol. Last drink 10 minutes ago, drinks one case of beer at least a day. Hx of DTs from withdrawals. Smells of ETOH, tearful.

## 2012-07-09 NOTE — ED Provider Notes (Signed)
History     CSN: [id number]  Arrival date & time 07/09/12  2042   None     Chief Complaint  Patient presents with  . Alcohol Problem    (Consider location/radiation/quality/duration/timing/severity/associated sxs/prior treatment) HPI Comments: Patient presents for alcohol detox. She reports having a "drinking problem" since 2007. She reports drinking 1 case of beer per day as well as 1 bottle of Listerine. Her past medical history includes depression, hypothyroidism, and GERD. Her last drink was just prior to coming to the hospital. She denies illicit drug use. She denies suicidal/homicidal ideations.   Patient is a 46 y.o. female presenting with alcohol problem.  Alcohol Problem    Past Medical History  Diagnosis Date  . Peptic ulcer   . Alcohol abuse   . Anemia   . Benzodiazepine abuse   . Depression   . Anxiety   . Thyroid disease   . Alcoholism   . Narcotic abuse   . Back pain   . Thrombocytopenia 06/17/2011  . Hypothyroidism     Past Surgical History  Procedure Date  . Cholecystectomy   . Abdominal surgery   . Esophagogastroduodenoscopy   . Gastric bypass   . Tubal ligation     Family History  Problem Relation Age of Onset  . Cancer Father   . Cancer Other     History  Substance Use Topics  . Smoking status: Current Every Day Smoker -- 1.0 packs/day for 10 years    Types: Cigarettes    Last Attempt to Quit: 08/08/2011  . Smokeless tobacco: Never Used  . Alcohol Use: Yes     drink 4 liters a day    OB History    Grav Para Term Preterm Abortions TAB SAB Ect Mult Living   3 3  3      3       Review of Systems  Psychiatric/Behavioral: The patient is nervous/anxious.   All other systems reviewed and are negative.    Allergies  Nsaids  Home Medications   Current Outpatient Rx  Name Route Sig Dispense Refill  . CLONAZEPAM 1 MG PO TABS Oral Take 1 mg by mouth 2 (two) times daily as needed. For anxiety.    . FEROSUL PO Oral Take 325 mg by  mouth daily.    Marland Kitchen LEVOTHYROXINE SODIUM 112 MCG PO TABS Oral Take 1 tablet (112 mcg total) by mouth daily. 30 tablet 1  . LORATADINE 10 MG PO TABS Oral Take 1 tablet (10 mg total) by mouth daily. For seasonal allergies. 30 tablet 1  . OMEPRAZOLE 20 MG PO CPDR Oral Take 40 mg by mouth daily.    . SERTRALINE HCL 100 MG PO TABS Oral Take 100 mg by mouth daily.      BP 135/83  Pulse 105  Temp 97.3 F (36.3 C) (Oral)  Resp 22  SpO2 99%  Physical Exam  Nursing note and vitals reviewed. Constitutional: She is oriented to person, place, and time. She appears well-developed and well-nourished. No distress.       Patient is tearful and upset. She smells of alcohol.   HENT:  Head: Normocephalic and atraumatic.  Mouth/Throat: No oropharyngeal exudate.  Eyes: Conjunctivae normal are normal. No scleral icterus.  Neck: Normal range of motion. Neck supple.  Cardiovascular: Normal rate and regular rhythm.  Exam reveals no gallop and no friction rub.   No murmur heard. Pulmonary/Chest: Effort normal and breath sounds normal. She has no wheezes. She has no rales. She  exhibits no tenderness.  Abdominal: Soft. There is no tenderness.  Musculoskeletal: Normal range of motion.  Neurological: She is alert and oriented to person, place, and time. Coordination normal.       Speech is goal-oriented. Moves limbs without ataxia.   Skin: Skin is warm and dry. She is not diaphoretic.  Psychiatric:       Dysphoric mood.     ED Course  Procedures (including critical care time)  Labs Reviewed  CBC - Abnormal; Notable for the following:    RBC 3.66 (*)     Hemoglobin 11.7 (*)     HCT 35.1 (*)     RDW 18.8 (*)     All other components within normal limits  COMPREHENSIVE METABOLIC PANEL - Abnormal; Notable for the following:    AST 68 (*)     ALT 37 (*)     Total Bilirubin 0.1 (*)     GFR calc non Af Amer 68 (*)     GFR calc Af Amer 79 (*)     All other components within normal limits  ETHANOL -  Abnormal; Notable for the following:    Alcohol, Ethyl (B) 351 (*)     All other components within normal limits  SALICYLATE LEVEL - Abnormal; Notable for the following:    Salicylate Lvl <2.0 (*)     All other components within normal limits  ACETAMINOPHEN LEVEL  URINE RAPID DRUG SCREEN (HOSP PERFORMED)   No results found.   1. Alcohol abuse       MDM  9:34 PM Patient is medically cleared. ACT consulted for evaluation.        Emilia Beck, PA-C 07/09/12 2213

## 2012-07-09 NOTE — ED Provider Notes (Signed)
Medical screening examination/treatment/procedure(s) were performed by non-physician practitioner and as supervising physician I was immediately available for consultation/collaboration.   Trayce Maino, MD 07/09/12 2346 
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
## 2012-07-10 ENCOUNTER — Encounter (HOSPITAL_COMMUNITY): Payer: Self-pay | Admitting: Unknown Physician Specialty

## 2012-07-10 LAB — RAPID URINE DRUG SCREEN, HOSP PERFORMED
Benzodiazepines: NOT DETECTED
Opiates: NOT DETECTED

## 2012-07-10 LAB — ETHANOL: Alcohol, Ethyl (B): 116 mg/dL — ABNORMAL HIGH (ref 0–11)

## 2012-07-10 MED ORDER — CLONAZEPAM 0.5 MG PO TABS: 1.0000 mg | ORAL_TABLET | Freq: Two times a day (BID) | ORAL | Status: DC | PRN

## 2012-07-10 MED ORDER — SERTRALINE HCL 50 MG PO TABS
100.0000 mg | ORAL_TABLET | Freq: Every day | ORAL | Status: DC
  Administered 2012-07-10: 50 mg via ORAL
  Filled 2012-07-10: qty 2
  Filled 2012-07-10: qty 1

## 2012-07-10 MED ORDER — PANTOPRAZOLE SODIUM 40 MG PO TBEC
40.0000 mg | DELAYED_RELEASE_TABLET | Freq: Every day | ORAL | Status: DC
  Administered 2012-07-10: 40 mg via ORAL
  Filled 2012-07-10: qty 1

## 2012-07-10 MED ORDER — LORAZEPAM 1 MG PO TABS
1.0000 mg | ORAL_TABLET | Freq: Once | ORAL | Status: AC
  Administered 2012-07-10: 1 mg via ORAL
  Filled 2012-07-10: qty 1

## 2012-07-10 MED ORDER — NICOTINE 21 MG/24HR TD PT24
21.0000 mg | MEDICATED_PATCH | Freq: Once | TRANSDERMAL | Status: DC
  Administered 2012-07-10: 21 mg via TRANSDERMAL
  Filled 2012-07-10: qty 1

## 2012-07-10 MED ORDER — LEVOTHYROXINE SODIUM 112 MCG PO TABS
112.0000 ug | ORAL_TABLET | Freq: Every day | ORAL | Status: DC
  Administered 2012-07-10: 112 ug via ORAL
  Filled 2012-07-10: qty 1

## 2012-07-10 MED ORDER — LORATADINE 10 MG PO TABS
10.0000 mg | ORAL_TABLET | Freq: Every day | ORAL | Status: DC
  Administered 2012-07-10: 10 mg via ORAL
  Filled 2012-07-10: qty 1

## 2012-07-10 MED ORDER — LORAZEPAM 1 MG PO TABS
2.0000 mg | ORAL_TABLET | Freq: Once | ORAL | Status: AC
  Administered 2012-07-10: 2 mg via ORAL
  Filled 2012-07-10: qty 2

## 2012-07-10 NOTE — ED Notes (Signed)
[facility] arrived to transport patient.

## 2012-07-10 NOTE — ED Notes (Signed)
CIWA alcohol scale of 7

## 2012-07-10 NOTE — BHH Counselor (Signed)
1610 Pt accepted at [facility] and [facility] will pick up pt at [facility] at 10:30am. Doctor notified of pt's disposition. Pt notified.  [employer], LPC

## 2012-07-10 NOTE — ED Notes (Signed)
ACT states patient accepted at [facility] and will be transported by [facility] approximately 1030

## 2012-07-10 NOTE — ED Notes (Signed)
Patient is waiting to be picked up by [facility].

## 2012-07-10 NOTE — BH Assessment (Signed)
BHH Assessment Progress Note      Spoke with Jacqueline [name] at [facility] who reports the patient is appropriate for their facility and there are female beds available.  Will fax labs with an updated BAL evidencing that the patient's BAL is headed toward 200 as well as a UDS and urine pregnancy to complete the referal.

## 2012-09-14 ENCOUNTER — Emergency Department (HOSPITAL_COMMUNITY)
Admission: EM | Admit: 2012-09-14 | Discharge: 2012-09-15 | Disposition: A | Discharge status: Home / Self Care | Payer: Self-pay | Attending: Emergency Medicine | Admitting: Emergency Medicine

## 2012-09-14 ENCOUNTER — Encounter (HOSPITAL_COMMUNITY): Payer: Self-pay | Admitting: Emergency Medicine

## 2012-09-14 DIAGNOSIS — Z862 Personal history of diseases of the blood and blood-forming organs and certain disorders involving the immune mechanism: Secondary | ICD-10-CM | POA: Insufficient documentation

## 2012-09-14 DIAGNOSIS — F329 Major depressive disorder, single episode, unspecified: Secondary | ICD-10-CM | POA: Insufficient documentation

## 2012-09-14 DIAGNOSIS — D696 Thrombocytopenia, unspecified: Secondary | ICD-10-CM | POA: Insufficient documentation

## 2012-09-14 DIAGNOSIS — F17200 Nicotine dependence, unspecified, uncomplicated: Secondary | ICD-10-CM | POA: Insufficient documentation

## 2012-09-14 DIAGNOSIS — F411 Generalized anxiety disorder: Secondary | ICD-10-CM | POA: Insufficient documentation

## 2012-09-14 DIAGNOSIS — E039 Hypothyroidism, unspecified: Secondary | ICD-10-CM | POA: Insufficient documentation

## 2012-09-14 DIAGNOSIS — F10229 Alcohol dependence with intoxication, unspecified: Secondary | ICD-10-CM

## 2012-09-14 DIAGNOSIS — M549 Dorsalgia, unspecified: Secondary | ICD-10-CM | POA: Insufficient documentation

## 2012-09-14 DIAGNOSIS — F1020 Alcohol dependence, uncomplicated: Secondary | ICD-10-CM | POA: Insufficient documentation

## 2012-09-14 DIAGNOSIS — F3289 Other specified depressive episodes: Secondary | ICD-10-CM | POA: Insufficient documentation

## 2012-09-14 DIAGNOSIS — F1010 Alcohol abuse, uncomplicated: Secondary | ICD-10-CM

## 2012-09-14 DIAGNOSIS — Z79899 Other long term (current) drug therapy: Secondary | ICD-10-CM | POA: Insufficient documentation

## 2012-09-14 LAB — CBC WITH DIFFERENTIAL/PLATELET
Eosinophils Absolute: 0 10*3/uL (ref 0.0–0.7)
Hemoglobin: 12.7 g/dL (ref 12.0–15.0)
Lymphs Abs: 1 10*3/uL (ref 0.7–4.0)
MCH: 31 pg (ref 26.0–34.0)
Monocytes Relative: 15 % — ABNORMAL HIGH (ref 3–12)
Neutro Abs: 3.1 10*3/uL (ref 1.7–7.7)
Neutrophils Relative %: 64 % (ref 43–77)
RBC: 4.1 MIL/uL (ref 3.87–5.11)

## 2012-09-14 LAB — BASIC METABOLIC PANEL
BUN: 11 mg/dL (ref 6–23)
Chloride: 95 mEq/L — ABNORMAL LOW (ref 96–112)
Glucose, Bld: 103 mg/dL — ABNORMAL HIGH (ref 70–99)
Potassium: 4.6 mEq/L (ref 3.5–5.1)

## 2012-09-14 MED ORDER — ALUM & MAG HYDROXIDE-SIMETH 200-200-20 MG/5ML PO SUSP
30.0000 mL | ORAL | Status: DC | PRN
  Administered 2012-09-14: 30 mL via ORAL
  Filled 2012-09-14: qty 30

## 2012-09-14 MED ORDER — ONDANSETRON HCL 8 MG PO TABS
4.0000 mg | ORAL_TABLET | Freq: Three times a day (TID) | ORAL | Status: DC | PRN
  Administered 2012-09-14 – 2012-09-15 (×2): 4 mg via ORAL
  Filled 2012-09-14 (×2): qty 1

## 2012-09-14 MED ORDER — LORAZEPAM 1 MG PO TABS
2.0000 mg | ORAL_TABLET | Freq: Once | ORAL | Status: AC
  Administered 2012-09-14: 2 mg via ORAL
  Filled 2012-09-14: qty 2

## 2012-09-14 MED ORDER — LORAZEPAM 2 MG/ML IJ SOLN: 2.0000 mg | Freq: Once | INTRAMUSCULAR | Status: DC

## 2012-09-14 MED ORDER — LORAZEPAM 1 MG PO TABS: 0.0000 mg | ORAL_TABLET | Freq: Two times a day (BID) | ORAL | Status: DC

## 2012-09-14 MED ORDER — LORAZEPAM 2 MG/ML IJ SOLN
1.0000 mg | Freq: Four times a day (QID) | INTRAMUSCULAR | Status: DC | PRN
  Filled 2012-09-14: qty 1

## 2012-09-14 MED ORDER — ZIPRASIDONE MESYLATE 20 MG IM SOLR: 10.0000 mg | Freq: Four times a day (QID) | INTRAMUSCULAR | Status: DC | PRN

## 2012-09-14 MED ORDER — VITAMIN B-1 100 MG PO TABS
100.0000 mg | ORAL_TABLET | Freq: Every day | ORAL | Status: DC
  Administered 2012-09-14 – 2012-09-15 (×2): 100 mg via ORAL
  Filled 2012-09-14 (×3): qty 1

## 2012-09-14 MED ORDER — ONDANSETRON HCL 4 MG/2ML IJ SOLN
4.0000 mg | Freq: Once | INTRAMUSCULAR | Status: AC
  Administered 2012-09-14: 4 mg via INTRAVENOUS
  Filled 2012-09-14: qty 2

## 2012-09-14 MED ORDER — ADULT MULTIVITAMIN W/MINERALS CH
1.0000 | ORAL_TABLET | Freq: Every day | ORAL | Status: DC
  Administered 2012-09-14 – 2012-09-15 (×2): 1 via ORAL
  Filled 2012-09-14 (×2): qty 1

## 2012-09-14 MED ORDER — LORAZEPAM 1 MG PO TABS
1.0000 mg | ORAL_TABLET | Freq: Four times a day (QID) | ORAL | Status: DC | PRN
  Administered 2012-09-15: 1 mg via ORAL
  Filled 2012-09-14 (×2): qty 2
  Filled 2012-09-14: qty 1

## 2012-09-14 MED ORDER — LORAZEPAM 1 MG PO TABS
0.0000 mg | ORAL_TABLET | Freq: Four times a day (QID) | ORAL | Status: DC
  Administered 2012-09-14 – 2012-09-15 (×3): 2 mg via ORAL
  Filled 2012-09-14: qty 2

## 2012-09-14 MED ORDER — NICOTINE 7 MG/24HR TD PT24
7.0000 mg | MEDICATED_PATCH | Freq: Once | TRANSDERMAL | Status: DC
  Administered 2012-09-14: 7 mg via TRANSDERMAL
  Filled 2012-09-14: qty 1

## 2012-09-14 MED ORDER — THIAMINE HCL 100 MG/ML IJ SOLN: 100.0000 mg | Freq: Every day | INTRAMUSCULAR | Status: DC

## 2012-09-14 MED ORDER — ACETAMINOPHEN 325 MG PO TABS
650.0000 mg | ORAL_TABLET | Freq: Four times a day (QID) | ORAL | Status: DC | PRN
  Administered 2012-09-14 – 2012-09-15 (×2): 650 mg via ORAL
  Filled 2012-09-14 (×2): qty 2

## 2012-09-14 MED ORDER — FOLIC ACID 1 MG PO TABS
1.0000 mg | ORAL_TABLET | Freq: Every day | ORAL | Status: DC
  Administered 2012-09-14 – 2012-09-15 (×2): 1 mg via ORAL
  Filled 2012-09-14 (×2): qty 1

## 2012-09-14 NOTE — ED Notes (Signed)
Dr Rulon Abide and Dr Freida Busman notified of pt's critical ethanol level.

## 2012-09-14 NOTE — ED Notes (Signed)
Pt ambulating to desk to make phone call, ambulating with stand by assistance.

## 2012-09-14 NOTE — ED Notes (Signed)
Boyfriend at bedside; wanded by security. Pt and boyfriend cooperative.

## 2012-09-14 NOTE — ED Notes (Signed)
Pt reports "constant diarrhea", nausea and general "not feeling well." Boyfriend at bedside.
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
## 2012-09-14 NOTE — ED Notes (Signed)
Fall bracelet applied to rt. Wrist.

## 2012-09-14 NOTE — ED Notes (Signed)
Boyfriend stated, she's been drinking straight for several weeks.  Me and my boss were going to pick her up and besides the alcohol shes been drinking Listerine. Pt. Is slumped over in the wheelchair, unable to sit up

## 2012-09-14 NOTE — ED Notes (Signed)
Report given to Irving Burton, RN.  Pt to go to Pod C 29.

## 2012-09-14 NOTE — ED Provider Notes (Addendum)
History   This chart was scribed for Jones Skene, MD by Melba Coon, ED Scribe. The patient was seen in room A08C/A08C and the patient's care was started at 1:48PM.    CSN: [id number]  Arrival date & time 09/14/12  1301   First MD Initiated Contact with Patient 09/14/12 1348     Level V caveat for intoxication  Chief Complaint  Patient presents with  . Alcohol Intoxication   History partially obtained per boyfriend.  (Consider location/radiation/quality/duration/timing/severity/associated sxs/prior treatment) The history is provided by a friend (boyfriend). The history is limited by the condition of the patient (intoxicated and not responding to questions). No language interpreter was used.   Jacqueline [name] is a 46 y.o. female who presents to the Emergency Department for alcohol detox today. Boyfriend of pt reports that Jacqueline [name] is a chronic alcoholic and her last drink was right before she presented to the ED today (about 1 hr ago). He reports she has tried to quit in the past and has experienced withdrawal symptoms. He reports she had a seizure when she went to [place] for withdrawal in the past year. Jacqueline [name] reports she has nausea. No other pertinent medical symptoms. She denies any pain but is extremely somnolent and resists questioning. She urinated on herself and defecated in the exam room.   Past Medical History  Diagnosis Date  . Peptic ulcer   . Alcohol abuse   . Anemia   . Benzodiazepine abuse   . Depression   . Anxiety   . Thyroid disease   . Alcoholism   . Narcotic abuse   . Back pain   . Thrombocytopenia 06/17/2011  . Hypothyroidism     Past Surgical History  Procedure Date  . Cholecystectomy   . Abdominal surgery   . Esophagogastroduodenoscopy   . Gastric bypass   . Tubal ligation     Family History  Problem Relation Age of Onset  . Cancer Father   . Cancer Other     History  Substance Use Topics  . Smoking status: Current Every Day Smoker  -- 1.0 packs/day for 10 years    Types: Cigarettes    Last Attempt to Quit: 08/08/2011  . Smokeless tobacco: Never Used  . Alcohol Use: Yes     Comment: drink 4 liters a day    OB History    Grav Para Term Preterm Abortions TAB SAB Ect Mult Living   3 3  3      3       Review of Systems Level V caveat for intoxication   Allergies  Nsaids  Home Medications   Current Outpatient Rx  Name  Route  Sig  Dispense  Refill  . CLONAZEPAM 1 MG PO TABS   Oral   Take 1 mg by mouth 2 (two) times daily as needed. For anxiety.         Marland Kitchen KEPPRA PO   Oral   Take 1 tablet by mouth 2 (two) times daily.         Marland Kitchen LEVOTHYROXINE SODIUM 112 MCG PO TABS   Oral   Take 1 tablet (112 mcg total) by mouth daily.   30 tablet   1   . LORATADINE 10 MG PO TABS   Oral   Take 1 tablet (10 mg total) by mouth daily. For seasonal allergies.   30 tablet   1   . OMEPRAZOLE 20 MG PO CPDR   Oral   Take 40 mg by  mouth daily.         . SERTRALINE HCL 100 MG PO TABS   Oral   Take 100 mg by mouth daily.           BP 101/45  Pulse 95  Temp 98 F (36.7 C) (Oral)  Resp 18  SpO2 98%  Physical Exam    Nursing notes reviewed.  Electronic medical record reviewed. VITAL SIGNS:   Filed Vitals:   09/14/12 1314 09/14/12 1436 09/14/12 1738 09/14/12 1825  BP: 120/84 101/45 116/76 125/82  Pulse: 102 95  112  Temp:  98 F (36.7 C)    TempSrc:  Oral    Resp: 14 18 10 22   SpO2: 93% 98%  96%   CONSTITUTIONAL: Awake, somnolent, appears extremely intoxicated, managing her own secretions HENT: Atraumatic, normocephalic, oral mucosa pink and moist, airway patent. Nares patent without drainage. External ears normal. EYES: Conjunctiva clear, EOMI, PERRLA NECK: Trachea midline, non-tender, supple CARDIOVASCULAR: Normal heart rate, Normal rhythm, No murmurs, rubs, gallops PULMONARY/CHEST: Clear to auscultation, no rhonchi, wheezes, or rales. Symmetrical breath sounds. Non-tender. ABDOMINAL:  Non-distended, soft, non-tender - no rebound or guarding.  BS normal. Pen marks around the abdominal center with mild abrasions and well-healed Laparotomy scar NEUROLOGIC: Non-focal, moving all four extremities, no gross sensory or motor deficits. EXTREMITIES: No clubbing, cyanosis, or edema SKIN: Warm, Dry, No erythema, No rash   ED Course  Procedures (including critical care time)  COORDINATION OF CARE:  1:51PM - zofran, blood w/u, ethanol screen, and drug screen will be ordered for Jacqueline [name]. ACT team will be consulted. 2:20PM - nurses reports that she had incontinence of her bowels in the room and was actively requesting Ativan which was not given to her. She will be moved to Pod A.  Labs Reviewed  CBC WITH DIFFERENTIAL - Abnormal; Notable for the following:    RDW 16.0 (*)     Monocytes Relative 15 (*)     All other components within normal limits  BASIC METABOLIC PANEL - Abnormal; Notable for the following:    Chloride 95 (*)     Glucose, Bld 103 (*)     GFR calc non Af Amer 85 (*)     All other components within normal limits  ETHANOL - Abnormal; Notable for the following:    Alcohol, Ethyl (B) 460 (*)     All other components within normal limits  URINE RAPID DRUG SCREEN (HOSP PERFORMED)   No results found.   1. Chronic alcohol abuse   2. Alcohol dependence with intoxication       MDM  Jacqueline [name] is a 46 y.o. female long history of alcohol abuse presenting with intoxication with an alcohol level of 460. Patient will wait until she is medically cleared, sober, before ACT team can evaluate for rehab assessment. She placed on CIWA protocol, holding orders placed. Transferred to Pod C   I personally performed the services described in this documentation, which was scribed in my presence. The recorded information has been reviewed and is accurate. Jones Skene, M.D.  Jones Skene, MD 09/15/12 [id number]

## 2012-09-14 NOTE — ED Notes (Signed)
Introduced self to pt. Pt made aware of plan of care. Pt placed in paper scrubs for comfort measures. Pt requesting nicotine patch; MD Bonk made aware. Orders to follow. Pt denies any complaints at this time. VSS. Pt placed on monitor.

## 2012-09-15 MED ORDER — PANTOPRAZOLE SODIUM 40 MG PO TBEC
40.0000 mg | DELAYED_RELEASE_TABLET | Freq: Every day | ORAL | Status: DC
  Administered 2012-09-15: 40 mg via ORAL
  Filled 2012-09-15: qty 1

## 2012-09-15 MED ORDER — SERTRALINE HCL 50 MG PO TABS
100.0000 mg | ORAL_TABLET | Freq: Every day | ORAL | Status: DC
  Administered 2012-09-15: 50 mg via ORAL
  Filled 2012-09-15: qty 2

## 2012-09-15 MED ORDER — CLONAZEPAM 0.5 MG PO TABS
1.0000 mg | ORAL_TABLET | Freq: Two times a day (BID) | ORAL | Status: DC
  Administered 2012-09-15: 1 mg via ORAL
  Filled 2012-09-15 (×2): qty 1

## 2012-09-15 MED ORDER — LEVOTHYROXINE SODIUM 112 MCG PO TABS
112.0000 ug | ORAL_TABLET | Freq: Every day | ORAL | Status: DC
  Filled 2012-09-15 (×2): qty 1

## 2012-09-15 NOTE — ED Notes (Signed)
Pt ambulated to restroom. 

## 2012-09-15 NOTE — ED Notes (Signed)
The pt is restless but sleeping

## 2012-09-15 NOTE — ED Provider Notes (Addendum)
Pt alert, content, nad. No tremor or shakes. Vitals stable.  Discussed w act team - placement pending.   Suzi Roots, MD 09/15/12 1003  Act team indicates [facility] accepted to [facility], to d/c there, they are coming to pick up patient. Pt alert, content, vitals stable, no tremor or shakes, appears stable for d/c to [facility].     Suzi Roots, MD 09/15/12 [phone]

## 2012-09-15 NOTE — BH Assessment (Signed)
Assessment Note  Update:  Completed pt referral over the phone with Shayla at [facility] at 1120.  Per Shayla @ 1500, pt accepted to [facility] and [facility] is providing tranpsportation and will pick the pt up from [facility] at 1545.  Pt has her prescribed medications to take with her.  Updated EDP Steinl and ED staff.  Updated assessment disposition, completed assessment notification and faxed to [facility] to log.     Disposition:  Disposition Disposition of Patient: Inpatient treatment program Type of inpatient treatment program: Adult (Pt accepted [facility])  On Site Evaluation by:   Reviewed with Physician:  Valene Bors, Rennis Harding 09/15/2012 3:03 PM

## 2012-09-15 NOTE — ED Notes (Signed)
The pt up to the br.  Pt nervous jittery and nauseated.  Ativan given

## 2012-09-29 ENCOUNTER — Observation Stay (HOSPITAL_COMMUNITY)
Admission: EM | Admit: 2012-09-29 | Discharge: 2012-10-02 | Disposition: A | Discharge status: Home / Self Care | Payer: Self-pay | Attending: Internal Medicine | Admitting: Internal Medicine

## 2012-09-29 ENCOUNTER — Encounter (HOSPITAL_COMMUNITY): Payer: Self-pay | Admitting: *Deleted

## 2012-09-29 DIAGNOSIS — F10229 Alcohol dependence with intoxication, unspecified: Secondary | ICD-10-CM

## 2012-09-29 DIAGNOSIS — D649 Anemia, unspecified: Secondary | ICD-10-CM

## 2012-09-29 DIAGNOSIS — N390 Urinary tract infection, site not specified: Secondary | ICD-10-CM

## 2012-09-29 DIAGNOSIS — F329 Major depressive disorder, single episode, unspecified: Secondary | ICD-10-CM

## 2012-09-29 DIAGNOSIS — F10932 Alcohol use, unspecified with withdrawal with perceptual disturbance: Secondary | ICD-10-CM

## 2012-09-29 DIAGNOSIS — K279 Peptic ulcer, site unspecified, unspecified as acute or chronic, without hemorrhage or perforation: Secondary | ICD-10-CM

## 2012-09-29 DIAGNOSIS — F1410 Cocaine abuse, uncomplicated: Secondary | ICD-10-CM

## 2012-09-29 DIAGNOSIS — R748 Abnormal levels of other serum enzymes: Secondary | ICD-10-CM

## 2012-09-29 DIAGNOSIS — E039 Hypothyroidism, unspecified: Secondary | ICD-10-CM

## 2012-09-29 DIAGNOSIS — F10239 Alcohol dependence with withdrawal, unspecified: Secondary | ICD-10-CM | POA: Insufficient documentation

## 2012-09-29 DIAGNOSIS — F3289 Other specified depressive episodes: Secondary | ICD-10-CM | POA: Insufficient documentation

## 2012-09-29 DIAGNOSIS — F419 Anxiety disorder, unspecified: Secondary | ICD-10-CM

## 2012-09-29 DIAGNOSIS — F10939 Alcohol use, unspecified with withdrawal, unspecified: Secondary | ICD-10-CM | POA: Insufficient documentation

## 2012-09-29 DIAGNOSIS — F10232 Alcohol dependence with withdrawal with perceptual disturbance: Secondary | ICD-10-CM

## 2012-09-29 DIAGNOSIS — F1910 Other psychoactive substance abuse, uncomplicated: Secondary | ICD-10-CM

## 2012-09-29 DIAGNOSIS — F32A Depression, unspecified: Secondary | ICD-10-CM

## 2012-09-29 DIAGNOSIS — F1020 Alcohol dependence, uncomplicated: Principal | ICD-10-CM

## 2012-09-29 DIAGNOSIS — D696 Thrombocytopenia, unspecified: Secondary | ICD-10-CM

## 2012-09-29 DIAGNOSIS — B9689 Other specified bacterial agents as the cause of diseases classified elsewhere: Secondary | ICD-10-CM

## 2012-09-29 DIAGNOSIS — F10929 Alcohol use, unspecified with intoxication, unspecified: Secondary | ICD-10-CM

## 2012-09-29 LAB — CBC WITH DIFFERENTIAL/PLATELET
Basophils Absolute: 0.1 10*3/uL (ref 0.0–0.1)
Basophils Relative: 1 % (ref 0–1)
Eosinophils Absolute: 0.1 10*3/uL (ref 0.0–0.7)
Eosinophils Relative: 2 % (ref 0–5)
Lymphocytes Relative: 31 % (ref 12–46)
MCH: 31.6 pg (ref 26.0–34.0)
MCHC: 34.1 g/dL (ref 30.0–36.0)
MCV: 92.6 fL (ref 78.0–100.0)
Monocytes Absolute: 0.5 10*3/uL (ref 0.1–1.0)
Platelets: 496 10*3/uL — ABNORMAL HIGH (ref 150–400)
RDW: 17.5 % — ABNORMAL HIGH (ref 11.5–15.5)
WBC: 3.9 10*3/uL — ABNORMAL LOW (ref 4.0–10.5)

## 2012-09-29 LAB — COMPREHENSIVE METABOLIC PANEL
ALT: 51 U/L — ABNORMAL HIGH (ref 0–35)
AST: 92 U/L — ABNORMAL HIGH (ref 0–37)
Albumin: 3.9 g/dL (ref 3.5–5.2)
Calcium: 8.4 mg/dL (ref 8.4–10.5)
Creatinine, Ser: 0.72 mg/dL (ref 0.50–1.10)
Sodium: 133 mEq/L — ABNORMAL LOW (ref 135–145)
Total Protein: 7.9 g/dL (ref 6.0–8.3)

## 2012-09-29 MED ORDER — SODIUM CHLORIDE 0.9 % IV SOLN
1000.0000 mL | INTRAVENOUS | Status: DC
  Administered 2012-09-30 – 2012-10-01 (×2): 1000 mL via INTRAVENOUS

## 2012-09-29 MED ORDER — SODIUM CHLORIDE 0.9 % IV SOLN
1000.0000 mL | Freq: Once | INTRAVENOUS | Status: AC
  Administered 2012-09-29: 1000 mL via INTRAVENOUS

## 2012-09-29 MED ORDER — ONDANSETRON HCL 4 MG/2ML IJ SOLN
4.0000 mg | Freq: Once | INTRAMUSCULAR | Status: AC
  Administered 2012-09-29: 4 mg via INTRAVENOUS
  Filled 2012-09-29: qty 2

## 2012-09-29 MED ORDER — SODIUM CHLORIDE 0.9 % IV SOLN
1000.0000 mL | Freq: Once | INTRAVENOUS | Status: AC
  Administered 2012-09-30: 1000 mL via INTRAVENOUS

## 2012-09-29 NOTE — ED Provider Notes (Signed)
History    This chart was scribed for [name], MD, MD by [name], ED Scribe. The patient was seen in room APA17 and the patient's care was started at 10:09 PM.   CSN: [id number]  Arrival date & time 09/29/12  2155  Level V caveat for intoxication    Chief Complaint  Patient presents with  . Alcohol Intoxication    (Consider location/radiation/quality/duration/timing/severity/associated sxs/prior treatment) Patient is a 46 y.o. female presenting with intoxication. The history is provided by the patient and a friend. No language interpreter was used.  Alcohol Intoxication This is a chronic problem. The current episode started more than 1 week ago. The problem occurs daily. The problem has not changed since onset.  Jacqueline [name] is a 46 y.o. female who presents to the Emergency Department BIB boyfriend for detox from alcohol. Pt reports that she drinks 1 case of beer/daily for years. Pt was in detox 1 month ago and she started drinking the day she was discharged. Boyfriend reports pt has been to detox treatment facility at least 3x within past 6-8 months without improvement.  Pt smokes cigarettes. Denies using illegal drugs. Pt does not work. Boyfriend reports pt has been dry heaving at home but has not vomited. She denies SI and HI. Pt lives with boyfriend. Boyfriend has gone to [place]. Boyfriend reports pt's mother wants her to go to [facility] at [facility]. He states her family have reached their limit with her alcoholism.    Pt seeks medical treatment from Avelina Laine at [facility]   Past Medical History  Diagnosis Date  . Peptic ulcer   . Alcohol abuse   . Anemia   . Benzodiazepine abuse   . Depression   . Anxiety   . Thyroid disease   . Alcoholism   . Narcotic abuse   . Back pain   . Thrombocytopenia 06/17/2011  . Hypothyroidism     Past Surgical History  Procedure Date  . Cholecystectomy   . Abdominal surgery   . Esophagogastroduodenoscopy   . Gastric bypass   .  Tubal ligation     Family History  Problem Relation Age of Onset  . Cancer Father   . Cancer Other     History  Substance Use Topics  . Smoking status: Current Every Day Smoker -- 1.0 packs/day for 10 years    Types: Cigarettes    Last Attempt to Quit: 08/08/2011  . Smokeless tobacco: Never Used  . Alcohol Use: Yes     Comment: drink 4 liters a day  unemployed Lives with boyfriend Drinks a case a day Was an Charity fundraiser in [place]  OB History    Grav Para Term Preterm Abortions TAB SAB Ect Mult Living   3 3  3      3       Review of Systems  All other systems reviewed and are negative.    Allergies  Nsaids  Home Medications   Current Outpatient Rx  Name  Route  Sig  Dispense  Refill  . CLONAZEPAM 1 MG PO TABS   Oral   Take 1 mg by mouth 2 (two) times daily as needed. For anxiety.         Marland Kitchen KEPPRA PO   Oral   Take 1 tablet by mouth 2 (two) times daily.         Marland Kitchen LEVOTHYROXINE SODIUM 112 MCG PO TABS   Oral   Take 1 tablet (112 mcg total) by mouth daily.  30 tablet   1   . LORATADINE 10 MG PO TABS   Oral   Take 1 tablet (10 mg total) by mouth daily. For seasonal allergies.   30 tablet   1   . OMEPRAZOLE 20 MG PO CPDR   Oral   Take 40 mg by mouth daily.         . SERTRALINE HCL 100 MG PO TABS   Oral   Take 100 mg by mouth daily.           BP 132/85  Pulse 109  Temp 97.8 F (36.6 C) (Oral)  Resp 20  Ht 5\' 9"  (1.753 m)  Wt 200 lb (90.719 kg)  BMI 29.53 kg/m2  SpO2 100%  LMP 09/12/2012  Vital signs normal except tachycardia   Physical Exam  Nursing note and vitals reviewed. Constitutional: She appears well-developed and well-nourished.  Non-toxic appearance. She does not appear ill. She appears distressed.       Appears to be intoxicated. Difficulty following commands and focusing. Pt is flailing  in bed without purpose.   HENT:  Head: Normocephalic and atraumatic.  Right Ear: External ear normal.  Left Ear: External ear normal.   Nose: Nose normal. No mucosal edema or rhinorrhea.  Mouth/Throat: Oropharynx is clear and moist and mucous membranes are normal. No dental abscesses or uvula swelling.  Eyes: Conjunctivae normal and EOM are normal. Pupils are equal, round, and reactive to light.  Neck: Normal range of motion and full passive range of motion without pain. Neck supple.  Cardiovascular: Normal rate, regular rhythm and normal heart sounds.  Exam reveals no gallop and no friction rub.   No murmur heard. Pulmonary/Chest: Effort normal and breath sounds normal. No respiratory distress. She has no wheezes. She has no rhonchi. She has no rales. She exhibits no tenderness and no crepitus.  Abdominal: Soft. Normal appearance and bowel sounds are normal. She exhibits no distension. There is no tenderness. There is no rebound and no guarding.  Musculoskeletal: Normal range of motion. She exhibits no edema and no tenderness.       Moves all extremities well.   Neurological: She has normal strength. No cranial nerve deficit.       Pt appears to be intoxicated, slow to respond, difficulty following directions  Skin: Skin is warm, dry and intact. No rash noted. No erythema. No pallor.  Psychiatric: Her speech is normal and behavior is normal. Her mood appears not anxious.       flat    ED Course  Procedures (including critical care time)   Medications  0.9 %  sodium chloride infusion (0 mL Intravenous Stopped 09/30/12 0023)    Followed by  0.9 %  sodium chloride infusion (1000 mL Intravenous New Bag/Given 09/30/12 0025)    Followed by  0.9 %  sodium chloride infusion (not administered)  LORazepam (ATIVAN) tablet 1 mg (not administered)  acetaminophen (TYLENOL) tablet 650 mg (not administered)  zolpidem (AMBIEN) tablet 10 mg (not administered)  nicotine (NICODERM CQ - dosed in mg/24 hours) patch 21 mg (not administered)  ondansetron (ZOFRAN) tablet 4 mg (not administered)  alum & mag hydroxide-simeth (MAALOX/MYLANTA)  200-200-20 MG/5ML suspension 30 mL (not administered)  ondansetron (ZOFRAN) injection 4 mg (4 mg Intravenous Given 09/29/12 2250)    DIAGNOSTIC STUDIES: Oxygen Saturation is 100% on room air, normal by my interpretation.    COORDINATION OF CARE: 10:15 PM Discussed ED treatment with pt   Patient given IV fluids. She was  given Zofran for her dry heaving. At this point patient is extremely intoxicated. She will not be able to be evaluated by the act team until she sobers in the morning. At that point she may no longer request detox. She has no depression or suicidal ideation per boyfriend   Results for orders placed during the hospital encounter of 09/29/12  CBC WITH DIFFERENTIAL      Component Value Range   WBC 3.9 (*) 4.0 - 10.5 K/uL   RBC 4.08  3.87 - 5.11 MIL/uL   Hemoglobin 12.9  12.0 - 15.0 g/dL   HCT 16.1  09.6 - 04.5 %   MCV 92.6  78.0 - 100.0 fL   MCH 31.6  26.0 - 34.0 pg   MCHC 34.1  30.0 - 36.0 g/dL   RDW 40.9 (*) 81.1 - 91.4 %   Platelets 496 (*) 150 - 400 K/uL   Neutrophils Relative 53  43 - 77 %   Neutro Abs 2.1  1.7 - 7.7 K/uL   Lymphocytes Relative 31  12 - 46 %   Lymphs Abs 1.2  0.7 - 4.0 K/uL   Monocytes Relative 13 (*) 3 - 12 %   Monocytes Absolute 0.5  0.1 - 1.0 K/uL   Eosinophils Relative 2  0 - 5 %   Eosinophils Absolute 0.1  0.0 - 0.7 K/uL   Basophils Relative 1  0 - 1 %   Basophils Absolute 0.1  0.0 - 0.1 K/uL  COMPREHENSIVE METABOLIC PANEL      Component Value Range   Sodium 133 (*) 135 - 145 mEq/L   Potassium 3.8  3.5 - 5.1 mEq/L   Chloride 93 (*) 96 - 112 mEq/L   CO2 25  19 - 32 mEq/L   Glucose, Bld 100 (*) 70 - 99 mg/dL   BUN 7  6 - 23 mg/dL   Creatinine, Ser 7.82  0.50 - 1.10 mg/dL   Calcium 8.4  8.4 - 95.6 mg/dL   Total Protein 7.9  6.0 - 8.3 g/dL   Albumin 3.9  3.5 - 5.2 g/dL   AST 92 (*) 0 - 37 U/L   ALT 51 (*) 0 - 35 U/L   Alkaline Phosphatase 87  39 - 117 U/L   Total Bilirubin 0.2 (*) 0.3 - 1.2 mg/dL   GFR calc non Af Amer >90  >90  mL/min   GFR calc Af Amer >90  >90 mL/min  ETHANOL      Component Value Range   Alcohol, Ethyl (B) 433 (*) 0 - 11 mg/dL  URINALYSIS, ROUTINE W REFLEX MICROSCOPIC      Component Value Range   Color, Urine STRAW (*) YELLOW   APPearance CLEAR  CLEAR   Specific Gravity, Urine 1.010  1.005 - 1.030   pH 5.5  5.0 - 8.0   Glucose, UA NEGATIVE  NEGATIVE mg/dL   Hgb urine dipstick SMALL (*) NEGATIVE   Bilirubin Urine NEGATIVE  NEGATIVE   Ketones, ur NEGATIVE  NEGATIVE mg/dL   Protein, ur NEGATIVE  NEGATIVE mg/dL   Urobilinogen, UA 0.2  0.0 - 1.0 mg/dL   Nitrite NEGATIVE  NEGATIVE   Leukocytes, UA NEGATIVE  NEGATIVE  URINE RAPID DRUG SCREEN (HOSP PERFORMED)      Component Value Range   Opiates NONE DETECTED  NONE DETECTED   Cocaine NONE DETECTED  NONE DETECTED   Benzodiazepines NONE DETECTED  NONE DETECTED   Amphetamines NONE DETECTED  NONE DETECTED   Tetrahydrocannabinol NONE DETECTED  NONE DETECTED   Barbiturates NONE DETECTED  NONE DETECTED  URINE MICROSCOPIC-ADD ON      Component Value Range   Squamous Epithelial / LPF MANY (*) RARE   WBC, UA 0-2  <3 WBC/hpf   RBC / HPF 0-2  <3 RBC/hpf   Bacteria, UA MANY (*) RARE   Laboratory interpretation all normal except alcohol intoxication minor hyponatremia and low chloride      1. Alcohol intoxication   2. Alcoholism     Plan reevaluation in the am when sober   Devoria Albe, MD, FACEP   MDM   I personally performed the services described in this documentation, which was scribed in my presence. The recorded information has been reviewed and considered.  Devoria Albe, MD, Armando Gang      [name], MD 09/30/12 [id number]

## 2012-09-29 NOTE — ED Notes (Addendum)
Has been drinking beer,   Sitting in w/c and starting to slide out.  Slurring speech,

## 2012-09-30 ENCOUNTER — Encounter (HOSPITAL_COMMUNITY): Payer: Self-pay | Admitting: *Deleted

## 2012-09-30 DIAGNOSIS — F1010 Alcohol abuse, uncomplicated: Secondary | ICD-10-CM

## 2012-09-30 DIAGNOSIS — F10951 Alcohol use, unspecified with alcohol-induced psychotic disorder with hallucinations: Secondary | ICD-10-CM

## 2012-09-30 LAB — URINALYSIS, ROUTINE W REFLEX MICROSCOPIC
Glucose, UA: NEGATIVE mg/dL
Leukocytes, UA: NEGATIVE
Protein, ur: NEGATIVE mg/dL
Specific Gravity, Urine: 1.01 (ref 1.005–1.030)

## 2012-09-30 LAB — RAPID URINE DRUG SCREEN, HOSP PERFORMED
Amphetamines: NOT DETECTED
Cocaine: NOT DETECTED
Opiates: NOT DETECTED
Tetrahydrocannabinol: NOT DETECTED

## 2012-09-30 LAB — CBC
MCH: 31 pg (ref 26.0–34.0)
MCHC: 32.8 g/dL (ref 30.0–36.0)
MCV: 94.5 fL (ref 78.0–100.0)
Platelets: 375 10*3/uL (ref 150–400)
RDW: 17.7 % — ABNORMAL HIGH (ref 11.5–15.5)

## 2012-09-30 LAB — URINE MICROSCOPIC-ADD ON

## 2012-09-30 MED ORDER — ALUM & MAG HYDROXIDE-SIMETH 200-200-20 MG/5ML PO SUSP: 30.0000 mL | ORAL | Status: DC | PRN

## 2012-09-30 MED ORDER — LEVETIRACETAM 500 MG PO TABS
500.0000 mg | ORAL_TABLET | Freq: Two times a day (BID) | ORAL | Status: DC
  Administered 2012-09-30 – 2012-10-02 (×4): 500 mg via ORAL
  Filled 2012-09-30 (×7): qty 1

## 2012-09-30 MED ORDER — NICOTINE 21 MG/24HR TD PT24
21.0000 mg | MEDICATED_PATCH | Freq: Every day | TRANSDERMAL | Status: DC
  Administered 2012-09-30 – 2012-10-02 (×3): 21 mg via TRANSDERMAL
  Filled 2012-09-30 (×3): qty 1

## 2012-09-30 MED ORDER — LORAZEPAM 2 MG/ML IJ SOLN
2.0000 mg | Freq: Once | INTRAMUSCULAR | Status: AC
  Administered 2012-09-30: 2 mg via INTRAVENOUS
  Filled 2012-09-30: qty 1

## 2012-09-30 MED ORDER — LORATADINE 10 MG PO TABS
10.0000 mg | ORAL_TABLET | Freq: Every day | ORAL | Status: DC
  Administered 2012-09-30 – 2012-10-02 (×3): 10 mg via ORAL
  Filled 2012-09-30 (×3): qty 1

## 2012-09-30 MED ORDER — ZOLPIDEM TARTRATE 5 MG PO TABS
10.0000 mg | ORAL_TABLET | Freq: Every evening | ORAL | Status: DC | PRN
  Administered 2012-09-30: 10 mg via ORAL
  Filled 2012-09-30: qty 2

## 2012-09-30 MED ORDER — LORAZEPAM 1 MG PO TABS
1.0000 mg | ORAL_TABLET | Freq: Three times a day (TID) | ORAL | Status: DC | PRN
  Administered 2012-09-30 – 2012-10-01 (×5): 1 mg via ORAL
  Filled 2012-09-30 (×5): qty 1

## 2012-09-30 MED ORDER — SERTRALINE HCL 50 MG PO TABS
100.0000 mg | ORAL_TABLET | Freq: Every day | ORAL | Status: DC
  Administered 2012-09-30 – 2012-10-02 (×3): 100 mg via ORAL
  Filled 2012-09-30: qty 2
  Filled 2012-09-30: qty 1
  Filled 2012-09-30: qty 2
  Filled 2012-09-30: qty 1

## 2012-09-30 MED ORDER — CLONAZEPAM 0.5 MG PO TABS
1.0000 mg | ORAL_TABLET | Freq: Two times a day (BID) | ORAL | Status: DC | PRN
  Administered 2012-09-30 – 2012-10-02 (×5): 1 mg via ORAL
  Filled 2012-09-30: qty 2
  Filled 2012-09-30: qty 1
  Filled 2012-09-30 (×4): qty 2

## 2012-09-30 MED ORDER — LEVOTHYROXINE SODIUM 112 MCG PO TABS
112.0000 ug | ORAL_TABLET | Freq: Every day | ORAL | Status: DC
  Administered 2012-09-30 – 2012-10-02 (×3): 112 ug via ORAL
  Filled 2012-09-30 (×6): qty 1

## 2012-09-30 MED ORDER — ONDANSETRON HCL 4 MG PO TABS
4.0000 mg | ORAL_TABLET | Freq: Three times a day (TID) | ORAL | Status: DC | PRN
  Administered 2012-09-30: 4 mg via ORAL
  Filled 2012-09-30: qty 1

## 2012-09-30 MED ORDER — ACETAMINOPHEN 325 MG PO TABS: 650.0000 mg | ORAL_TABLET | ORAL | Status: DC | PRN

## 2012-09-30 MED ORDER — SODIUM CHLORIDE 0.9 % IV SOLN
1000.0000 mg | Freq: Once | INTRAVENOUS | Status: AC
  Administered 2012-09-30: 1000 mg via INTRAVENOUS
  Filled 2012-09-30: qty 10

## 2012-09-30 MED ORDER — FOLIC ACID 1 MG PO TABS
1.0000 mg | ORAL_TABLET | Freq: Every day | ORAL | Status: DC
  Administered 2012-09-30 – 2012-10-02 (×3): 1 mg via ORAL
  Filled 2012-09-30 (×3): qty 1

## 2012-09-30 MED ORDER — HEPARIN SODIUM (PORCINE) 5000 UNIT/ML IJ SOLN
5000.0000 [IU] | Freq: Three times a day (TID) | INTRAMUSCULAR | Status: DC
  Administered 2012-09-30 – 2012-10-02 (×7): 5000 [IU] via SUBCUTANEOUS
  Filled 2012-09-30 (×7): qty 1

## 2012-09-30 MED ORDER — PANTOPRAZOLE SODIUM 40 MG PO TBEC
80.0000 mg | DELAYED_RELEASE_TABLET | Freq: Every day | ORAL | Status: DC
  Administered 2012-09-30 – 2012-10-02 (×3): 80 mg via ORAL
  Filled 2012-09-30 (×3): qty 2

## 2012-09-30 MED ORDER — SODIUM CHLORIDE 0.9 % IV SOLN
300.0000 mg | Freq: Two times a day (BID) | INTRAVENOUS | Status: DC
  Filled 2012-09-30 (×3): qty 3

## 2012-09-30 NOTE — ED Notes (Signed)
Patient resting quietly with eyes closed.  Equal rise and fall of chest noted.

## 2012-09-30 NOTE — ED Notes (Signed)
Patient ambulatory to bathroom.

## 2012-09-30 NOTE — H&P (Signed)
[facility] History and Physical  Jacqueline [name] ZOX:[id number] DOB: 04/17/1966 DOA: 09/29/2012  Referring physician: Dr. Estell Harpin, ER physician.    Chief Complaint: Visual hallucinations, alcohol withdrawal.  HPI: Jacqueline [name] is a 46 y.o. female presents with visual hallucinations, shaking of her arms. Patient is an alcoholic, consuming a case of beer every day or even wine 3-5 L per day. She came to the emergency room yesterday evening and has spent the night here, unfortunately he started to get visual hallucinations as her alcohol level decreased. She wishes to have alcohol detoxification.   Review of Systems:  Apart from symptoms above other systems negative.  Past Medical History  Diagnosis Date  . Peptic ulcer   . Alcohol abuse   . Anemia   . Benzodiazepine abuse   . Depression   . Anxiety   . Thyroid disease   . Alcoholism   . Narcotic abuse   . Back pain   . Thrombocytopenia 06/17/2011  . Hypothyroidism    Past Surgical History  Procedure Date  . Cholecystectomy   . Abdominal surgery   . Esophagogastroduodenoscopy   . Gastric bypass   . Tubal ligation    Social History:  Patient does not appear to have a stable home. She is a smoker. She is alcoholic. She does have a history of oxycodone and cocaine abuse in the past.  Allergies  Allergen Reactions  . Nsaids Other (See Comments)    G.I. Bleed    Family History  Problem Relation Age of Onset  . Cancer Father   . Cancer Other       Prior to Admission medications   Medication Sig Start Date End Date Taking? Authorizing Provider  clonazePAM (KLONOPIN) 1 MG tablet Take 1 mg by mouth 2 (two) times daily as needed. For anxiety.    Historical Provider, MD  LevETIRAcetam (KEPPRA PO) Take 1 tablet by mouth 2 (two) times daily.    Historical Provider, MD  levothyroxine (SYNTHROID, LEVOTHROID) 112 MCG tablet Take 1 tablet (112 mcg total) by mouth daily. 05/15/12 05/15/13  Hannah Muthersbaugh, PA-C   loratadine (CLARITIN) 10 MG tablet Take 1 tablet (10 mg total) by mouth daily. For seasonal allergies. 02/24/12   Verne Spurr, PA-C  omeprazole (PRILOSEC) 20 MG capsule Take 40 mg by mouth daily.    Historical Provider, MD  sertraline (ZOLOFT) 100 MG tablet Take 100 mg by mouth daily.    Historical Provider, MD   Physical Exam: Filed Vitals:   09/29/12 2200 09/30/12 1215  BP: 132/85 123/63  Pulse: 109 90  Temp: 97.8 F (36.6 C) 98.8 F (37.1 C)  TempSrc: Oral Oral  Resp: 20 18  Height: 5\' 9"  (1.753 m)   Weight: 90.719 kg (200 lb)   SpO2: 100% 98%     General:  She looks systemically well. She is alert.  Eyes: No jaundice. No pallor.  ENT: No abnormalities.  Neck: No neck lymphadenopathy.  Cardiovascular: Heart sounds are present without murmurs or gallop rhythm.  Respiratory: Lung fields are clear.  Abdomen: Soft, nontender. Bowel sounds are heard. No hepatomegaly.  Skin: No rash.  Musculoskeletal: No joint problems.  Psychiatric: Depressed affect.  Neurologic: Alert. No focal neurological signs. She does have tremor of outstretched hands. Reportedly, she has been having visual hallucinations.  Labs on Admission:  Basic Metabolic Panel:  Lab 09/29/12 8119  NA 133*  K 3.8  CL 93*  CO2 25  GLUCOSE 100*  BUN 7  CREATININE 0.72  CALCIUM 8.4  MG --  PHOS --   Liver Function Tests:  Lab 09/29/12 2237  AST 92*  ALT 51*  ALKPHOS 87  BILITOT 0.2*  PROT 7.9  ALBUMIN 3.9     CBC:  Lab 09/29/12 2237  WBC 3.9*  NEUTROABS 2.1  HGB 12.9  HCT 37.8  MCV 92.6  PLT 496*          Assessment/Plan   1. Alcohol withdrawal. 2. Alcohol abuse with intoxication. 3. Hypothyroidism.   Plan: 1. Admit to medical floor. 2. Intravenous fluids. 3. Ativan as required. 4. Continue with Keppra from home. 5. Act team consultation for placement.  Code Status: Full code.  Family Communication: Discussed plan with patient at the bedside.   Disposition  Plan: Home when medically stable.   Time spent: 30 minutes.  Wilson Singer [facility] Pager [phone].  If 7PM-7AM, please contact night-coverage [email or web address] Password [email or web address] 09/30/2012, 1:07 PM

## 2012-09-30 NOTE — ED Notes (Signed)
Patient awake; given ice water per request.  Patient c/o feeling anxious.

## 2012-09-30 NOTE — ED Notes (Signed)
Pt mother notified of pt room placement. Pt mother verbalized understanding.

## 2012-09-30 NOTE — ED Notes (Addendum)
Pt mother, Malachi Carl, had to leave. Pt mother states she would likely not be able to come back to the hospital today but would like to be updated. Pt mother contact information: [phone].

## 2012-09-30 NOTE — ED Notes (Signed)
Patient clamped her IV and clamped IV fluid and refused to be reconnected.

## 2012-09-30 NOTE — ED Notes (Addendum)
Pt gave permission to contact mother. Pt mother called and updated on pt care plan. Pt mother verbalized understanding.
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
## 2012-09-30 NOTE — ED Notes (Signed)
Second liter of NS infusing without difficulty.  Patient requesting Ativan to help her sleep.  Patient states she wants to sleep and can't .

## 2012-09-30 NOTE — ED Notes (Signed)
Attempted report x1. RN on floor stated would call back.

## 2012-09-30 NOTE — ED Notes (Signed)
Pt mom here to visit patient at this time.

## 2012-09-30 NOTE — ED Notes (Signed)
Patient lying on left side with eyes closed.  Equal rise and fall of chest noted. 

## 2012-10-01 DIAGNOSIS — R748 Abnormal levels of other serum enzymes: Secondary | ICD-10-CM

## 2012-10-01 DIAGNOSIS — E039 Hypothyroidism, unspecified: Secondary | ICD-10-CM

## 2012-10-01 LAB — CBC
HCT: 34.2 % — ABNORMAL LOW (ref 36.0–46.0)
Platelets: 337 10*3/uL (ref 150–400)
RBC: 3.55 MIL/uL — ABNORMAL LOW (ref 3.87–5.11)
RDW: 17.7 % — ABNORMAL HIGH (ref 11.5–15.5)
WBC: 3.3 10*3/uL — ABNORMAL LOW (ref 4.0–10.5)

## 2012-10-01 LAB — PROTIME-INR: INR: 0.99 (ref 0.00–1.49)

## 2012-10-01 LAB — COMPREHENSIVE METABOLIC PANEL
Alkaline Phosphatase: 72 U/L (ref 39–117)
BUN: 7 mg/dL (ref 6–23)
Calcium: 8.2 mg/dL — ABNORMAL LOW (ref 8.4–10.5)
Creatinine, Ser: 0.78 mg/dL (ref 0.50–1.10)
GFR calc Af Amer: >90 mL/min (ref >90)
Glucose, Bld: 74 mg/dL (ref 70–99)
Potassium: 3.3 mEq/L — ABNORMAL LOW (ref 3.5–5.1)
Total Protein: 6.3 g/dL (ref 6.0–8.3)

## 2012-10-01 MED ORDER — ACETAMINOPHEN 325 MG PO TABS
650.0000 mg | ORAL_TABLET | Freq: Four times a day (QID) | ORAL | Status: DC | PRN
  Administered 2012-10-01 – 2012-10-02 (×3): 650 mg via ORAL
  Filled 2012-10-01 (×3): qty 2

## 2012-10-01 MED ORDER — POTASSIUM CHLORIDE CRYS ER 20 MEQ PO TBCR
40.0000 meq | EXTENDED_RELEASE_TABLET | Freq: Once | ORAL | Status: AC
  Administered 2012-10-01: 40 meq via ORAL
  Filled 2012-10-01: qty 2

## 2012-10-01 MED ORDER — VITAMIN B-1 100 MG PO TABS
100.0000 mg | ORAL_TABLET | Freq: Every day | ORAL | Status: DC
  Administered 2012-10-02: 100 mg via ORAL
  Filled 2012-10-01: qty 1

## 2012-10-01 MED ORDER — LORAZEPAM 1 MG PO TABS
1.0000 mg | ORAL_TABLET | Freq: Four times a day (QID) | ORAL | Status: DC | PRN
  Administered 2012-10-01 – 2012-10-02 (×4): 1 mg via ORAL
  Filled 2012-10-01 (×4): qty 1

## 2012-10-01 NOTE — Plan of Care (Signed)
Problem: Phase I Progression Outcomes Goal: Initial discharge plan identified Outcome: Progressing ACT team working on residential placement.

## 2012-10-01 NOTE — BHH Counselor (Signed)
Called to [phone] by Dr Karilyn Cota to discuss Ms Youngren. He feels the patient is now medically stable and ready for transfer. Placed a call to carol at [facility]. She described detox for the physician. Dr Karilyn Cota agreed with Okey Regal and will keep the patient overnight, for a more complete detox. Spoke with Ms Gains and she is still requesting ketox/rehab. Spoke with Shelly at [facility]. She informed us that she would need a Keppra level on  The day of discharge before the patient could be considered for residential services. This information was given to Dr  Karilyn Cota, who will contact ACT 10/02/2012 to request an assessment for residential services.

## 2012-10-01 NOTE — Progress Notes (Signed)
Pt very anxious, scheduled ativan given. Dr Karilyn Cota notified, IV to NSL and p.o. Thiamine supplement ordered.

## 2012-10-01 NOTE — Progress Notes (Signed)
Pt c/o of mild headache.  Pt has no PRN pain medication.  Dr. Karilyn Cota notified.

## 2012-10-01 NOTE — Progress Notes (Signed)
Pt requesting her PRN Ativan.  Order was discontinued today.  Pt feels that she needs the PRN Ativan.  Dr. Karilyn Cota paged with request for PRN Ativan.

## 2012-10-01 NOTE — Progress Notes (Signed)
     Subjective: This lady is much improved compared to yesterday. She is much less tremulous, no further visual hallucinations. She is able to hold a  very sensible conversation with me.           Physical Exam: Blood pressure 110/75, pulse 71, temperature 98.6 F (37 C), temperature source Oral, resp. rate 20, height 5\' 9"  (1.753 m), weight 90.719 kg (200 lb), last menstrual period 09/30/2012, SpO2 98.00%. She looks systemically well. She is alert and orientated. There is hardly any tremor of her outstretched hands. She has no further hallucinations of any kind. Heart sounds are present and normal. Lung fields clear. There are no focal neurological signs.   Investigations:     Basic Metabolic Panel:  Basename 10/01/12 0630 09/30/12 1204 09/29/12 2237  NA 133* -- 133*  K 3.3* -- 3.8  CL 97 -- 93*  CO2 27 -- 25  GLUCOSE 74 -- 100*  BUN 7 -- 7  CREATININE 0.78 0.83 --  CALCIUM 8.2* -- 8.4  MG -- -- --  PHOS -- -- --   Liver Function Tests:  [facility] 10/01/12 0630 09/29/12 2237  AST 49* 92*  ALT 34 51*  ALKPHOS 72 87  BILITOT 0.5 0.2*  PROT 6.3 7.9  ALBUMIN 3.1* 3.9     CBC:  Basename 10/01/12 0630 09/30/12 1204 09/29/12 2237  WBC 3.3* 6.9 --  NEUTROABS -- -- 2.1  HGB 11.0* 11.3* --  HCT 34.2* 34.5* --  MCV 96.3 94.5 --  PLT 337 375 --        Medications: I have reviewed the patient's current medications.  Impression: 1. Alcohol dependence with intoxication. 2. Alcohol withdrawal hallucinations. 3. Hypothyroidism. 4 .Hypokalemia.     Plan: 1. Discontinue IV fluids and encourage oral intake. 2. Replete potassium. 3. Mobilize. 4. Probable discharge to residential care for further detoxification, hopefully tomorrow.     LOS: 2 days   Wilson Singer Pager [phone]  10/01/2012, 9:53 AM

## 2012-10-02 ENCOUNTER — Encounter (HOSPITAL_COMMUNITY): Payer: Self-pay | Admitting: General Practice

## 2012-10-02 DIAGNOSIS — F10229 Alcohol dependence with intoxication, unspecified: Secondary | ICD-10-CM

## 2012-10-02 LAB — CBC
HCT: 36.4 % (ref 36.0–46.0)
Platelets: 320 10*3/uL (ref 150–400)
RBC: 3.78 MIL/uL — ABNORMAL LOW (ref 3.87–5.11)
RDW: 17.7 % — ABNORMAL HIGH (ref 11.5–15.5)
WBC: 4 10*3/uL (ref 4.0–10.5)

## 2012-10-02 LAB — COMPREHENSIVE METABOLIC PANEL
AST: 40 U/L — ABNORMAL HIGH (ref 0–37)
Albumin: 3.6 g/dL (ref 3.5–5.2)
Alkaline Phosphatase: 75 U/L (ref 39–117)
CO2: 28 mEq/L (ref 19–32)
Chloride: 102 mEq/L (ref 96–112)
Potassium: 3.5 mEq/L (ref 3.5–5.1)
Total Bilirubin: 0.3 mg/dL (ref 0.3–1.2)

## 2012-10-02 MED ORDER — LORAZEPAM 1 MG PO TABS: 1.0000 mg | ORAL_TABLET | Freq: Four times a day (QID) | ORAL | Status: DC | PRN

## 2012-10-02 MED ORDER — THIAMINE HCL 100 MG PO TABS: 100.0000 mg | ORAL_TABLET | Freq: Every day | ORAL | Status: DC

## 2012-10-02 MED ORDER — NICOTINE 21 MG/24HR TD PT24: 1.0000 | MEDICATED_PATCH | Freq: Every day | TRANSDERMAL | Status: DC

## 2012-10-02 MED ORDER — FOLIC ACID 1 MG PO TABS: 1.0000 mg | ORAL_TABLET | Freq: Every day | ORAL | Status: DC

## 2012-10-02 NOTE — Discharge Summary (Signed)
Physician Discharge Summary  Jacqueline [name] ZOX:[id number] DOB: Dec 23, 1965 DOA: 09/29/2012    Admit date: 09/29/2012 Discharge date: 10/02/2012  Time spent: Less than 30 minutes  Recommendations for Outpatient Follow-up:  1. Followup with behavioral health for ongoing issues with alcohol abuse.   Discharge Diagnoses:  1. Alcoholism with withdrawal symptoms, resolved. 2. Hypothyroidism, stable. 3. Depression.  Discharge Condition: Stable.  Diet recommendation: Regular. No alcohol.  Filed Weights   09/29/12 2200  Weight: 90.719 kg (200 lb)    History of present illness:  This 46 year old lady presented to the hospital with symptoms of visual hallucinations. Please see initial history as outlined below: HPI: Jacqueline [name] is a 46 y.o. female presents with visual hallucinations, shaking of her arms. Patient is an alcoholic, consuming a case of beer every day or even wine 3-5 L per day. She came to the emergency room yesterday evening and has spent the night here, unfortunately he started to get visual hallucinations as her alcohol level decreased. She wishes to have alcohol detoxification.  Hospital Course:  Patient was in alcohol withdrawal but recovered very quickly. She is seeking help for detoxification. Act team was involved. She is now out of withdrawal and medically stable for discharge. The act team will try and coordinate disposition.  Procedures:  None.   Consultations:  Act team.  Discharge Exam: Filed Vitals:   10/01/12 1650 10/01/12 2000 10/01/12 2144 10/02/12 0548  BP: 137/86 137/86 134/79 116/75  Pulse: 73 73 84 59  Temp:   98 F (36.7 C) 97.5 F (36.4 C)  TempSrc:   Oral Oral  Resp:   20 19  Height:      Weight:      SpO2:   97% 98%    General: She looks systemically well. She is not toxic or septic. Cardiovascular: Heart sounds are present without murmurs or gallop rhythm. Respiratory: Lung fields are clear. She is alert and orientated.  There is no evidence of any visual hallucinations. She has no tremor. She is clinically not in withdrawal anymore. There are no focal neurological signs.  Discharge Instructions  Discharge Orders    Future Orders Please Complete By Expires   Diet - low sodium heart healthy      Increase activity slowly          Medication List     As of 10/02/2012  7:46 AM    TAKE these medications         clonazePAM 1 MG tablet   Commonly known as: KLONOPIN   Take 1 mg by mouth 2 (two) times daily as needed. For anxiety.      folic acid 1 MG tablet   Commonly known as: FOLVITE   Take 1 tablet (1 mg total) by mouth daily.      KEPPRA PO   Take 1 tablet by mouth 2 (two) times daily.      levothyroxine 112 MCG tablet   Commonly known as: SYNTHROID, LEVOTHROID   Take 1 tablet (112 mcg total) by mouth daily.      loratadine 10 MG tablet   Commonly known as: CLARITIN   Take 1 tablet (10 mg total) by mouth daily. For seasonal allergies.      LORazepam 1 MG tablet   Commonly known as: ATIVAN   Take 1 tablet (1 mg total) by mouth every 6 (six) hours as needed for anxiety.      nicotine 21 mg/24hr patch   Commonly known as: NICODERM CQ -  dosed in mg/24 hours   Place 1 patch onto the skin daily.      omeprazole 20 MG capsule   Commonly known as: PRILOSEC   Take 40 mg by mouth daily.      sertraline 100 MG tablet   Commonly known as: ZOLOFT   Take 100 mg by mouth daily.      thiamine 100 MG tablet   Take 1 tablet (100 mg total) by mouth daily.          The results of significant diagnostics from this hospitalization (including imaging, microbiology, ancillary and laboratory) are listed below for reference.    Significant Diagnostic Studies: No results found.     Labs: Basic Metabolic Panel:  Lab 10/02/12 4098 10/01/12 0630 09/30/12 1204 09/29/12 2237  NA 140 133* -- 133*  K 3.5 3.3* -- 3.8  CL 102 97 -- 93*  CO2 28 27 -- 25  GLUCOSE 79 74 -- 100*  BUN 7 7 -- 7   CREATININE 0.80 0.78 0.83 0.72  CALCIUM 8.9 8.2* -- 8.4  MG -- -- -- --  PHOS -- -- -- --   Liver Function Tests:  Lab 10/02/12 0456 10/01/12 0630 09/29/12 2237  AST 40* 49* 92*  ALT 32 34 51*  ALKPHOS 75 72 87  BILITOT 0.3 0.5 0.2*  PROT 7.1 6.3 7.9  ALBUMIN 3.6 3.1* 3.9     CBC:  Lab 10/02/12 0456 10/01/12 0630 09/30/12 1204 09/29/12 2237  WBC 4.0 3.3* 6.9 3.9*  NEUTROABS -- -- -- 2.1  HGB 11.8* 11.0* 11.3* 12.9  HCT 36.4 34.2* 34.5* 37.8  MCV 96.3 96.3 94.5 92.6  PLT 320 337 375 496*        Signed:  GOSRANI,NIMISH C  [facility] 10/02/2012, 7:46 AM

## 2012-10-02 NOTE — Progress Notes (Addendum)
10/02/12 9:59 AM Patient is in [facility] room 325 and the doctor is requesting help with discharge. She is requesting further detox and treatment for her alcohol dependence. No symptoms of discomfort at this time: Last Ativan 1 mg given at 05:09 this morning. She has a history of withdrawal seizures in the past. Reports that she has been drinking a case of beer per day along with a large bottle of Listerine per week. Last use of cocaine was 3 months ago when she inhaled 1 gram. Denies SI/HI, visual or auditory hallucinations; is not psychotic or delusional. Her family has lost confidence in her and she has no place to go at discharge. Plan: Find inpatient placement for alcohol detox and treatment.   12:01:02 PM Continue to wait for Keppra result that was drawn this morning as requested by [place] [facility] (per Frances Maywood). 3:11:26 PM Keppra level result may not be available until Monday. [facility] cannot take pt due to h/o seizures. Their advise is that pt must be detoxed medically and that the next 2 days are the most crucial concerning seizures. Reported same to nurse who has reported to the doctor. Talked with pt about my efforts and she verbalized understanding of her situation at present.  3:46:56 PM Dr. Karilyn Cota does not agree with the above-stated plan. He said that he cannot medically justify her continued stay because she is detoxed and medically clearned. The plan is to discharge pt and for her to follow-up with [facility] for outpatient treatment. Explained this to the patient. She is distressed because she does not know how she is going to get to [facility].  4:01:06 PM Provided pt with the telephone number for "[facility]" to contact for assistance with services and/or locations for homeless shelters.

## 2012-10-02 NOTE — Care Management Note (Addendum)
    Page 1 of 1   10/03/2012     1:13:59 PM   CARE MANAGEMENT NOTE 10/03/2012  Patient:  Jacqueline [name], Jacqueline [name]   Account Number:  [id number]  Date Initiated:  10/02/2012  Documentation initiated by:  Rosemary Holms  Subjective/Objective Assessment:   Pt admitted for substance abuse. ACT team is working on Inpt placement. Leaning toward [facility].     Action/Plan:   Anticipated DC Date:  10/02/2012   Anticipated DC Plan:  HOME/SELF CARE      DC Planning Services  CM consult      Choice offered to / List presented to:             Status of service:  Completed, signed off Medicare Important Message given?   (If response is "NO", the following Medicare IM given date fields will be blank) Date Medicare IM given:   Date Additional Medicare IM given:    Discharge Disposition:  IP REHAB FACILITY  Per UR Regulation:    If discussed at Long Length of Stay Meetings, dates discussed:    Comments:  10/03/12 Rosemary Holms RN BSN CM [facility] gave pt voucher for meds when pt was DC'd home instead of [facility] or [facility].  10/02/12 Raye Slyter RN BSN CM CM asked to look into the Match program if pt transferred to [facility]. Match approved. RN, Abby states that due to pt's Hx of seizures, [facility] will not take pt and likely will be Dc' d to [facility].

## 2012-10-02 NOTE — Progress Notes (Signed)
UR Chart Review Completed  

## 2012-10-07 NOTE — Progress Notes (Signed)
Pt and her mother verbalize understanding of discharge instructions. Plan is for pt to stay with her mother and attempt to go to [facility] tomorrow morning for additional rehab, as pt is medically stable for discharge and not eligible for placement at this time. Pt given shelter and transportation resources. Pt d/c her own IV. I found IV intact in waste bin. pt d/c via wheelchair by myself to her mothers car. Pt received vouchers for her prescriptions. Sheryn Bison

## 2012-12-21 ENCOUNTER — Emergency Department (HOSPITAL_COMMUNITY)
Admission: EM | Admit: 2012-12-21 | Discharge: 2012-12-22 | Disposition: A | Discharge status: Other Acute Inpatient Hospital | Payer: Self-pay | Attending: Emergency Medicine | Admitting: Emergency Medicine

## 2012-12-21 ENCOUNTER — Encounter (HOSPITAL_COMMUNITY): Payer: Self-pay | Admitting: Emergency Medicine

## 2012-12-21 DIAGNOSIS — F1010 Alcohol abuse, uncomplicated: Secondary | ICD-10-CM | POA: Insufficient documentation

## 2012-12-21 DIAGNOSIS — F1310 Sedative, hypnotic or anxiolytic abuse, uncomplicated: Secondary | ICD-10-CM | POA: Insufficient documentation

## 2012-12-21 DIAGNOSIS — F3289 Other specified depressive episodes: Secondary | ICD-10-CM | POA: Insufficient documentation

## 2012-12-21 DIAGNOSIS — F411 Generalized anxiety disorder: Secondary | ICD-10-CM | POA: Insufficient documentation

## 2012-12-21 DIAGNOSIS — Z79899 Other long term (current) drug therapy: Secondary | ICD-10-CM | POA: Insufficient documentation

## 2012-12-21 DIAGNOSIS — D649 Anemia, unspecified: Secondary | ICD-10-CM | POA: Insufficient documentation

## 2012-12-21 DIAGNOSIS — F1910 Other psychoactive substance abuse, uncomplicated: Secondary | ICD-10-CM

## 2012-12-21 DIAGNOSIS — F17200 Nicotine dependence, unspecified, uncomplicated: Secondary | ICD-10-CM | POA: Insufficient documentation

## 2012-12-21 DIAGNOSIS — F329 Major depressive disorder, single episode, unspecified: Secondary | ICD-10-CM | POA: Insufficient documentation

## 2012-12-21 DIAGNOSIS — R45850 Homicidal ideations: Secondary | ICD-10-CM | POA: Insufficient documentation

## 2012-12-21 DIAGNOSIS — F1110 Opioid abuse, uncomplicated: Secondary | ICD-10-CM | POA: Insufficient documentation

## 2012-12-21 DIAGNOSIS — Z862 Personal history of diseases of the blood and blood-forming organs and certain disorders involving the immune mechanism: Secondary | ICD-10-CM | POA: Insufficient documentation

## 2012-12-21 DIAGNOSIS — R443 Hallucinations, unspecified: Secondary | ICD-10-CM | POA: Insufficient documentation

## 2012-12-21 DIAGNOSIS — E039 Hypothyroidism, unspecified: Secondary | ICD-10-CM | POA: Insufficient documentation

## 2012-12-21 DIAGNOSIS — R45851 Suicidal ideations: Secondary | ICD-10-CM | POA: Insufficient documentation

## 2012-12-21 DIAGNOSIS — Z8711 Personal history of peptic ulcer disease: Secondary | ICD-10-CM | POA: Insufficient documentation

## 2012-12-21 DIAGNOSIS — F1410 Cocaine abuse, uncomplicated: Secondary | ICD-10-CM | POA: Insufficient documentation

## 2012-12-21 LAB — COMPREHENSIVE METABOLIC PANEL
AST: 42 U/L — ABNORMAL HIGH (ref 0–37)
Albumin: 4 g/dL (ref 3.5–5.2)
CO2: 21 mEq/L (ref 19–32)
Calcium: 8.9 mg/dL (ref 8.4–10.5)
Creatinine, Ser: 0.77 mg/dL (ref 0.50–1.10)
GFR calc non Af Amer: >90 mL/min (ref >90)

## 2012-12-21 LAB — CBC WITH DIFFERENTIAL/PLATELET
Basophils Absolute: 0.1 10*3/uL (ref 0.0–0.1)
Basophils Relative: 1 % (ref 0–1)
Eosinophils Absolute: 0 10*3/uL (ref 0.0–0.7)
Eosinophils Relative: 0 % (ref 0–5)
HCT: 34.8 % — ABNORMAL LOW (ref 36.0–46.0)
MCH: 29.9 pg (ref 26.0–34.0)
MCHC: 33 g/dL (ref 30.0–36.0)
MCV: 90.4 fL (ref 78.0–100.0)
Monocytes Absolute: 0.8 10*3/uL (ref 0.1–1.0)
RDW: 15.9 % — ABNORMAL HIGH (ref 11.5–15.5)

## 2012-12-21 LAB — RAPID URINE DRUG SCREEN, HOSP PERFORMED
Amphetamines: NOT DETECTED
Opiates: NOT DETECTED

## 2012-12-21 LAB — ETHANOL: Alcohol, Ethyl (B): 307 mg/dL — ABNORMAL HIGH (ref 0–11)

## 2012-12-21 MED ORDER — ZOLPIDEM TARTRATE 5 MG PO TABS: 5.0000 mg | ORAL_TABLET | Freq: Every evening | ORAL | Status: DC | PRN

## 2012-12-21 MED ORDER — LORAZEPAM 1 MG PO TABS: 0.0000 mg | ORAL_TABLET | Freq: Two times a day (BID) | ORAL | Status: DC

## 2012-12-21 MED ORDER — LORAZEPAM 1 MG PO TABS
0.0000 mg | ORAL_TABLET | Freq: Four times a day (QID) | ORAL | Status: DC
  Administered 2012-12-21: 1 mg via ORAL
  Administered 2012-12-21 – 2012-12-22 (×2): 2 mg via ORAL
  Administered 2012-12-22: 1 mg via ORAL
  Filled 2012-12-21: qty 2
  Filled 2012-12-21: qty 1
  Filled 2012-12-21: qty 2
  Filled 2012-12-21: qty 1

## 2012-12-21 MED ORDER — ACETAMINOPHEN 325 MG PO TABS
650.0000 mg | ORAL_TABLET | ORAL | Status: DC | PRN
  Administered 2012-12-21 – 2012-12-22 (×3): 650 mg via ORAL
  Filled 2012-12-21 (×3): qty 2

## 2012-12-21 MED ORDER — FOLIC ACID 1 MG PO TABS
1.0000 mg | ORAL_TABLET | Freq: Every day | ORAL | Status: DC
  Administered 2012-12-21 – 2012-12-22 (×2): 1 mg via ORAL
  Filled 2012-12-21 (×2): qty 1

## 2012-12-21 MED ORDER — LEVETIRACETAM 500 MG PO TABS
500.0000 mg | ORAL_TABLET | Freq: Two times a day (BID) | ORAL | Status: DC
  Administered 2012-12-21 – 2012-12-22 (×3): 500 mg via ORAL
  Filled 2012-12-21 (×5): qty 1

## 2012-12-21 MED ORDER — ONDANSETRON HCL 4 MG PO TABS
4.0000 mg | ORAL_TABLET | Freq: Three times a day (TID) | ORAL | Status: DC | PRN
  Administered 2012-12-21: 4 mg via ORAL
  Filled 2012-12-21: qty 1

## 2012-12-21 MED ORDER — ALUM & MAG HYDROXIDE-SIMETH 200-200-20 MG/5ML PO SUSP: 30.0000 mL | ORAL | Status: DC | PRN

## 2012-12-21 MED ORDER — VITAMIN B-1 100 MG PO TABS
100.0000 mg | ORAL_TABLET | Freq: Every day | ORAL | Status: DC
  Administered 2012-12-21 – 2012-12-22 (×2): 100 mg via ORAL
  Filled 2012-12-21 (×2): qty 1

## 2012-12-21 MED ORDER — THIAMINE HCL 100 MG/ML IJ SOLN
100.0000 mg | Freq: Every day | INTRAMUSCULAR | Status: DC
  Filled 2012-12-21: qty 2

## 2012-12-21 MED ORDER — LEVOTHYROXINE SODIUM 112 MCG PO TABS
112.0000 ug | ORAL_TABLET | Freq: Every day | ORAL | Status: DC
  Administered 2012-12-21 – 2012-12-22 (×2): 112 ug via ORAL
  Filled 2012-12-21 (×3): qty 1

## 2012-12-21 MED ORDER — NICOTINE 21 MG/24HR TD PT24
21.0000 mg | MEDICATED_PATCH | Freq: Every day | TRANSDERMAL | Status: DC
  Administered 2012-12-21 – 2012-12-22 (×2): 21 mg via TRANSDERMAL
  Filled 2012-12-21 (×2): qty 1

## 2012-12-21 MED ORDER — ADULT MULTIVITAMIN W/MINERALS CH
1.0000 | ORAL_TABLET | Freq: Every day | ORAL | Status: DC
  Administered 2012-12-21 – 2012-12-22 (×2): 1 via ORAL
  Filled 2012-12-21 (×2): qty 1

## 2012-12-21 NOTE — ED Notes (Signed)
Complained of 7/10 headache- tylenol given

## 2012-12-21 NOTE — ED Notes (Signed)
Pt presenting to ed with c/o "I wanted to walk out in front of a car and kill myself because alcohol go a hold on me and I can't quit drinking" pt states I last drank Listerine right before I walked in here

## 2012-12-21 NOTE — BH Assessment (Signed)
Assessment Note   Jacqueline [name] is an 47 y.o. female.   Pt consumes listerine mouthwash daily as well as consumes other forms of alcohol.  Pt reports wanting to end life due to SA use.  "I don't want to live this way and if I leave here without getting help I will kill myself."  Pt denies AVH and HI.  Pt does report seeing things when she drinks or detoxing but not when she is clean.  Pt has long hx of SA abuse issues.    MSE:  Fair eye contact, affect flat, tearful, Ox3, judgement impaired, panic attacks, going through withdraws (CIWA 14).    Pt is not appropriate for [facility] or [facility] due to SI and plan to end life.  Pt was able to remain clean for 6 yrs and has relapsed over the last 3 years and has been consuming alcohol daily except when in treatment.  [place].    Pt reports no supports, no hope and a desire to end life rather than continuing to live the way pt reports she is doing at present time.  Pt is cooperative and verbalizes willingness to participate in tx.  R/O Personality Disorder(s)  Axis I: Major Depression, Recurrent severe and Alcohol Dependent Axis II: R/O Personality Disorder Axis III:  Past Medical History  Diagnosis Date  . Peptic ulcer   . Alcohol abuse   . Anemia   . Benzodiazepine abuse   . Depression   . Anxiety   . Thyroid disease   . Alcoholism   . Narcotic abuse   . Back pain   . Thrombocytopenia 06/17/2011  . Hypothyroidism    Axis IV: economic problems, housing problems, other psychosocial or environmental problems, problems related to social environment and problems with primary support group Axis V: 31-40 impairment in reality testing  Past Medical History:  Past Medical History  Diagnosis Date  . Peptic ulcer   . Alcohol abuse   . Anemia   . Benzodiazepine abuse   . Depression   . Anxiety   . Thyroid disease   . Alcoholism   . Narcotic abuse   . Back pain   . Thrombocytopenia 06/17/2011   . Hypothyroidism     Past Surgical History  Procedure Laterality Date  . Cholecystectomy    . Abdominal surgery    . Esophagogastroduodenoscopy    . Gastric bypass    . Tubal ligation      Family History:  Family History  Problem Relation Age of Onset  . Alcohol abuse Father   . Alcoholism Father   . Cancer Other     Social History:  reports that she has been smoking Cigarettes.  She has a 12 pack-year smoking history. She has never used smokeless tobacco. She reports that  drinks alcohol. She reports that she uses illicit drugs (Oxycodone and Cocaine).  Additional Social History:  Alcohol / Drug Use Pain Medications: na Prescriptions: na Over the Counter: na History of alcohol / drug use?: Yes Longest period of sobriety (when/how long): 6 yrs Negative Consequences of Use: Financial;Personal relationships;Work / Mining engineer #1 Name of Substance 1: alcohol 1 - Age of First Use: 14 1 - Amount (size/oz): listerine or 12 beers; wine, liquor  1 - Frequency: daily 1 - Duration: 3 yrs 1 - Last Use / Amount: 12-20-12  CIWA: CIWA-Ar BP: 107/56 mmHg Pulse Rate: 100 Nausea and Vomiting: 2 Tactile Disturbances: mild itching,  pins and needles, burning or numbness Tremor: no tremor Auditory Disturbances: mild harshness or ability to frighten Paroxysmal Sweats: barely perceptible sweating, palms moist Visual Disturbances: mild sensitivity Anxiety: moderately anxious, or guarded, so anxiety is inferred Headache, Fullness in Head: very mild Agitation: normal activity Orientation and Clouding of Sensorium: oriented and can do serial additions CIWA-Ar Total: 14 COWS:    Allergies:  Allergies  Allergen Reactions  . Nsaids Other (See Comments)    G.I. Bleed    Home Medications:  (Not in a hospital admission)  OB/GYN Status:  Patient's last menstrual period was 12/17/2012.  General Assessment Data Location of Assessment: [facility] Living Arrangements: Alone Can pt  return to current living arrangement?: Yes Admission Status: Voluntary Is patient capable of signing voluntary admission?: Yes Transfer from: Acute Hospital Referral Source: MD  Education Status Is patient currently in school?: No  Risk to self Suicidal Ideation: Yes-Currently Present Suicidal Intent: Yes-Currently Present Is patient at risk for suicide?: Yes Suicidal Plan?: Yes-Currently Present Specify Current Suicidal Plan: walk into traffic; OD or drink self to death Access to Means: Yes Specify Access to Suicidal Means: has use of legs, can consume alcohol, access to pills What has been your use of drugs/alcohol within the last 12 months?: alcohol Previous Attempts/Gestures: Yes How many times?: 3 Other Self Harm Risks: none Triggers for Past Attempts: Other (Comment) (SA issues, depression, homeless) Intentional Self Injurious Behavior: None Family Suicide History: No Recent stressful life event(s): Financial Problems;Job Loss Persecutory voices/beliefs?: No Depression: Yes Depression Symptoms: Insomnia;Despondent;Tearfulness;Isolating;Fatigue;Guilt;Loss of interest in usual pleasures;Feeling worthless/self pity;Feeling angry/irritable Substance abuse history and/or treatment for substance abuse?: Yes Suicide prevention information given to non-admitted patients: Not applicable  Risk to Others Homicidal Ideation: No Thoughts of Harm to Others: No Current Homicidal Intent: No Current Homicidal Plan: No Access to Homicidal Means: No Identified Victim: na History of harm to others?: No Assessment of Violence: None Noted Violent Behavior Description: cooperative but tearful and anxious Does patient have access to weapons?: No Criminal Charges Pending?: No Does patient have a court date: No  Psychosis Hallucinations: None noted Delusions: None noted  Mental Status Report Appear/Hygiene: Poor hygiene;Disheveled Eye Contact: Fair Motor Activity:  Restlessness Speech: Logical/coherent;Tangential;Slurred Level of Consciousness: Quiet/awake;Restless;Crying;Irritable Mood: Depressed;Anxious;Suspicious;Ashamed/humiliated;Despair;Fearful;Guilty;Helpless;Irritable;Preoccupied;Sad;Worthless, low self-esteem Affect: Anxious;Apprehensive;Appropriate to circumstance;Depressed;Irritable;Preoccupied;Sad Anxiety Level: Panic Attacks Panic attack frequency: daily Most recent panic attack: 12-21-12 Thought Processes: Relevant;Tangential Judgement: Impaired Orientation: Person;Place;Situation Obsessive Compulsive Thoughts/Behaviors: Minimal  Cognitive Functioning Concentration: Decreased Memory: Recent Intact;Remote Intact IQ: Average Insight: Poor Impulse Control: Poor Appetite: Fair Weight Loss: 0 Weight Gain: 0 Sleep: Decreased Total Hours of Sleep: 2 Vegetative Symptoms: Decreased grooming  ADLScreening [facility]) Patient's cognitive ability adequate to safely complete daily activities?: Yes Patient able to express need for assistance with ADLs?: Yes Independently performs ADLs?: Yes (appropriate for developmental age)  Abuse/Neglect [facility]) Physical Abuse: Denies Verbal Abuse: Denies Sexual Abuse: Denies  Prior Inpatient Therapy Prior Inpatient Therapy: Yes Prior Therapy Dates: 12/13 Prior Therapy Facilty/Provider(s): [facility] Reason for Treatment: SI SA  Prior Outpatient Therapy Prior Outpatient Therapy: No Prior Therapy Dates: na Prior Therapy Facilty/Provider(s): na Reason for Treatment: na  ADL Screening (condition at time of admission) Patient's cognitive ability adequate to safely complete daily activities?: Yes Patient able to express need for assistance with ADLs?: Yes Independently performs ADLs?: Yes (appropriate for developmental age) Weakness of Legs: None Weakness of Arms/Hands: None  Home Assistive Devices/Equipment Home Assistive Devices/Equipment: None  Therapy Consults (therapy consults  require a physician order) PT  Evaluation Needed: No OT Evalulation Needed: No SLP Evaluation Needed: No Abuse/Neglect Assessment (Assessment to be complete while patient is alone) Physical Abuse: Denies Verbal Abuse: Denies Sexual Abuse: Denies Exploitation of patient/patient's resources: Denies Self-Neglect: Denies Values / Beliefs Cultural Requests During Hospitalization: None Spiritual Requests During Hospitalization: None Consults Spiritual Care Consult Needed: No Social Work Consult Needed: No Merchant navy officer (For Healthcare) Advance Directive: Patient does not have advance directive Pre-existing out of facility DNR order (yellow form or pink MOST form): No    Additional Information 1:1 In Past 12 Months?: No CIRT Risk: No Elopement Risk: No Does patient have medical clearance?: No     Disposition:   Pt to be reviewed at [facility] in the AM for open beds Disposition Initial Assessment Completed: Yes Disposition of Patient: Inpatient treatment program Type of inpatient treatment program: Adult  On Site Evaluation by:   Reviewed with Physician:     Titus Mould, Eppie Gibson 12/21/2012 2:58 PM

## 2012-12-21 NOTE — ED Provider Notes (Signed)
History     CSN: [id number]  Arrival date & time 12/21/12  1049   First MD Initiated Contact with Patient 12/21/12 1108      No chief complaint on file.   (Consider location/radiation/quality/duration/timing/severity/associated sxs/prior treatment) HPI  47 year old female with a significant history of polysubstance abuse including alcohol abuse presents requesting for alcohol detox. Patient admits that she has a significant history of alcohol abuse. She reports she drinks "a lot of alcohol and Listerine on a daily basis". Last alcohol consumption was this morning. Patient felt hopeless, helpless, and suicidal. She felt if she does not receive treatment, she will walk in front of a car and kill herself. She denies homicidal ideation but does endorse hallucination. States she would see bugs and voices in her head whenever she drinks. She also used cocaine, last use was last night.  She would like to have long term detox and sts her mom will help her.  Otherwise no other specific complaints.  Is a smoker but reports smoking less than a pack a day.    Past Medical History  Diagnosis Date  . Peptic ulcer   . Alcohol abuse   . Anemia   . Benzodiazepine abuse   . Depression   . Anxiety   . Thyroid disease   . Alcoholism   . Narcotic abuse   . Back pain   . Thrombocytopenia 06/17/2011  . Hypothyroidism     Past Surgical History  Procedure Laterality Date  . Cholecystectomy    . Abdominal surgery    . Esophagogastroduodenoscopy    . Gastric bypass    . Tubal ligation      Family History  Problem Relation Age of Onset  . Alcohol abuse Father   . Alcoholism Father   . Cancer Other     History  Substance Use Topics  . Smoking status: Current Every Day Smoker -- 0.50 packs/day for 12 years    Types: Cigarettes    Last Attempt to Quit: 08/08/2011  . Smokeless tobacco: Never Used  . Alcohol Use: Yes     Comment: drink 4 liters a day. 1 case/day    OB History   Grav Para  Term Preterm Abortions TAB SAB Ect Mult Living   3 3  3      3       Review of Systems  Constitutional:       10 Systems reviewed and all are negative for acute change except as noted in the HPI.     Allergies  Nsaids  Home Medications   Current Outpatient Rx  Name  Route  Sig  Dispense  Refill  . clonazePAM (KLONOPIN) 1 MG tablet   Oral   Take 1 mg by mouth 2 (two) times daily.         . folic acid (FOLVITE) 1 MG tablet   Oral   Take 1 tablet (1 mg total) by mouth daily.   30 tablet   0   . levETIRAcetam (KEPPRA) 500 MG tablet   Oral   Take 500 mg by mouth every 12 (twelve) hours.         Marland Kitchen levothyroxine (SYNTHROID, LEVOTHROID) 112 MCG tablet   Oral   Take 1 tablet (112 mcg total) by mouth daily.   30 tablet   1   . loratadine (CLARITIN) 10 MG tablet   Oral   Take 1 tablet (10 mg total) by mouth daily. For seasonal allergies.   30 tablet  1   . LORazepam (ATIVAN) 1 MG tablet   Oral   Take 1 tablet (1 mg total) by mouth every 6 (six) hours as needed for anxiety.   30 tablet   0   . nicotine (NICODERM CQ - DOSED IN MG/24 HOURS) 21 mg/24hr patch   Transdermal   Place 1 patch onto the skin daily.   28 patch   0   . omeprazole (PRILOSEC) 20 MG capsule   Oral   Take 40 mg by mouth daily.         . sertraline (ZOLOFT) 100 MG tablet   Oral   Take 100 mg by mouth daily.         Marland Kitchen thiamine 100 MG tablet   Oral   Take 1 tablet (100 mg total) by mouth daily.   30 tablet   0     BP 112/86  Pulse 115  Temp(Src) 98.2 F (36.8 C) (Oral)  Resp 18  SpO2 98%  Physical Exam  Nursing note and vitals reviewed. Constitutional: She appears well-developed and well-nourished. No distress.  Awake, alert, nontoxic appearance  HENT:  Head: Atraumatic.  Eyes: Conjunctivae are normal. Right eye exhibits no discharge. Left eye exhibits no discharge.  Neck: Neck supple.  Cardiovascular: Normal rate and regular rhythm.   Pulmonary/Chest: Effort normal.  No respiratory distress. She exhibits no tenderness.  Abdominal: Soft. There is no tenderness. There is no rebound.  Musculoskeletal: She exhibits no tenderness.  ROM appears intact, no obvious focal weakness  Neurological: GCS eye subscore is 4. GCS verbal subscore is 5. GCS motor subscore is 6.  Mental status and motor strength appears intact  Skin: No rash noted.  Psychiatric: Her speech is normal. She is withdrawn. Thought content is not paranoid and not delusional. Cognition and memory are impaired. She does not express inappropriate judgment. She exhibits a depressed mood. She expresses homicidal ideation. She expresses no suicidal ideation.    ED Course  Procedures (including critical care time)  11:21 AM Pt is here requesting for detox.  Admits to drinking listerine, more than a bottle daily.  Also report SI if she does not receive help with her alcohol abuse.  Med screen initiated.  Will consult ACT, and obtain telepsych if ACT felt is appropriate.  Care discussed with attending.    11:56 AM I have consulted with ACT, who agrees to evaluate and manage pt.  Psych hold and med rec filled.    Labs Reviewed  CBC WITH DIFFERENTIAL - Abnormal; Notable for the following:    RBC 3.85 (*)    Hemoglobin 11.5 (*)    HCT 34.8 (*)    RDW 15.9 (*)    Platelets 527 (*)    All other components within normal limits  COMPREHENSIVE METABOLIC PANEL - Abnormal; Notable for the following:    Glucose, Bld 105 (*)    AST 42 (*)    All other components within normal limits  ETHANOL - Abnormal; Notable for the following:    Alcohol, Ethyl (B) 307 (*)    All other components within normal limits  URINE RAPID DRUG SCREEN (HOSP PERFORMED) - Abnormal; Notable for the following:    Cocaine POSITIVE (*)    All other components within normal limits  SALICYLATE LEVEL - Abnormal; Notable for the following:    Salicylate Lvl 2.1 (*)    All other components within normal limits  PREGNANCY, URINE   ACETAMINOPHEN LEVEL   No results found.  1. Polysubstance abuse   2. Alcohol abuse       MDM  BP 105/68  Pulse 113  Temp(Src) 99 F (37.2 C) (Oral)  Resp 18  SpO2 97%  LMP 12/17/2012  I have reviewed nursing notes and vital signs.  I reviewed available ER/hospitalization records thought the EMR         Fayrene Helper, [place] 12/21/12 2010

## 2012-12-21 NOTE — ED Notes (Signed)
MD  [place] aware that patient CIWA score was greater than 10 for 2 consecutive assessments. Oder given to stay on same CIWA regime.

## 2012-12-22 ENCOUNTER — Inpatient Hospital Stay (HOSPITAL_COMMUNITY)
Admission: AD | Admit: 2012-12-22 | Discharge: 2012-12-26 | DRG: 897 | Disposition: A | Discharge status: Home / Self Care | Payer: Federal, State, Local not specified - Other | Source: Ambulatory Visit | Attending: Psychiatry | Admitting: Psychiatry

## 2012-12-22 ENCOUNTER — Encounter (HOSPITAL_COMMUNITY): Payer: Self-pay | Admitting: Psychiatry

## 2012-12-22 DIAGNOSIS — F419 Anxiety disorder, unspecified: Secondary | ICD-10-CM | POA: Diagnosis present

## 2012-12-22 DIAGNOSIS — F10939 Alcohol use, unspecified with withdrawal, unspecified: Principal | ICD-10-CM | POA: Diagnosis present

## 2012-12-22 DIAGNOSIS — F411 Generalized anxiety disorder: Secondary | ICD-10-CM | POA: Diagnosis present

## 2012-12-22 DIAGNOSIS — F329 Major depressive disorder, single episode, unspecified: Secondary | ICD-10-CM | POA: Diagnosis present

## 2012-12-22 DIAGNOSIS — F1410 Cocaine abuse, uncomplicated: Secondary | ICD-10-CM | POA: Diagnosis present

## 2012-12-22 DIAGNOSIS — F10932 Alcohol use, unspecified with withdrawal with perceptual disturbance: Secondary | ICD-10-CM | POA: Diagnosis present

## 2012-12-22 DIAGNOSIS — Z79899 Other long term (current) drug therapy: Secondary | ICD-10-CM

## 2012-12-22 DIAGNOSIS — F10229 Alcohol dependence with intoxication, unspecified: Secondary | ICD-10-CM | POA: Diagnosis present

## 2012-12-22 DIAGNOSIS — F10239 Alcohol dependence with withdrawal, unspecified: Principal | ICD-10-CM

## 2012-12-22 DIAGNOSIS — F32A Depression, unspecified: Secondary | ICD-10-CM | POA: Diagnosis present

## 2012-12-22 DIAGNOSIS — F1910 Other psychoactive substance abuse, uncomplicated: Secondary | ICD-10-CM

## 2012-12-22 DIAGNOSIS — F10232 Alcohol dependence with withdrawal with perceptual disturbance: Secondary | ICD-10-CM

## 2012-12-22 DIAGNOSIS — F1020 Alcohol dependence, uncomplicated: Secondary | ICD-10-CM | POA: Diagnosis present

## 2012-12-22 MED ORDER — ALUM & MAG HYDROXIDE-SIMETH 200-200-20 MG/5ML PO SUSP
30.0000 mL | ORAL | Status: DC | PRN
  Administered 2012-12-25: 30 mL via ORAL

## 2012-12-22 MED ORDER — ADULT MULTIVITAMIN W/MINERALS CH
1.0000 | ORAL_TABLET | Freq: Every day | ORAL | Status: DC
  Administered 2012-12-23 – 2012-12-26 (×2): 1 via ORAL
  Filled 2012-12-22 (×7): qty 1

## 2012-12-22 MED ORDER — CHLORDIAZEPOXIDE HCL 25 MG PO CAPS
50.0000 mg | ORAL_CAPSULE | Freq: Once | ORAL | Status: AC
  Administered 2012-12-22: 50 mg via ORAL
  Filled 2012-12-22: qty 2

## 2012-12-22 MED ORDER — THIAMINE HCL 100 MG/ML IJ SOLN: 100.0000 mg | Freq: Once | INTRAMUSCULAR | Status: DC

## 2012-12-22 MED ORDER — LOPERAMIDE HCL 2 MG PO CAPS
2.0000 mg | ORAL_CAPSULE | ORAL | Status: AC | PRN
  Administered 2012-12-24: 4 mg via ORAL

## 2012-12-22 MED ORDER — VITAMIN B-1 100 MG PO TABS
100.0000 mg | ORAL_TABLET | Freq: Every day | ORAL | Status: DC
  Administered 2012-12-23 – 2012-12-26 (×4): 100 mg via ORAL
  Filled 2012-12-22 (×6): qty 1

## 2012-12-22 MED ORDER — CHLORDIAZEPOXIDE HCL 25 MG PO CAPS
25.0000 mg | ORAL_CAPSULE | Freq: Four times a day (QID) | ORAL | Status: AC
  Administered 2012-12-22 (×2): 25 mg via ORAL
  Filled 2012-12-22 (×2): qty 1

## 2012-12-22 MED ORDER — MAGNESIUM HYDROXIDE 400 MG/5ML PO SUSP: 30.0000 mL | Freq: Every day | ORAL | Status: DC | PRN

## 2012-12-22 MED ORDER — CHLORDIAZEPOXIDE HCL 25 MG PO CAPS
25.0000 mg | ORAL_CAPSULE | Freq: Four times a day (QID) | ORAL | Status: AC | PRN
  Administered 2012-12-22 – 2012-12-24 (×5): 25 mg via ORAL
  Filled 2012-12-22 (×5): qty 1

## 2012-12-22 MED ORDER — CHLORDIAZEPOXIDE HCL 25 MG PO CAPS
25.0000 mg | ORAL_CAPSULE | Freq: Every day | ORAL | Status: AC
  Administered 2012-12-25: 25 mg via ORAL
  Filled 2012-12-22: qty 1

## 2012-12-22 MED ORDER — PANTOPRAZOLE SODIUM 40 MG PO TBEC
40.0000 mg | DELAYED_RELEASE_TABLET | Freq: Every day | ORAL | Status: DC
  Administered 2012-12-22: 40 mg via ORAL
  Filled 2012-12-22: qty 1

## 2012-12-22 MED ORDER — LEVOTHYROXINE SODIUM 112 MCG PO TABS
112.0000 ug | ORAL_TABLET | Freq: Every day | ORAL | Status: DC
  Administered 2012-12-23 – 2012-12-26 (×4): 112 ug via ORAL
  Filled 2012-12-22 (×3): qty 1
  Filled 2012-12-22: qty 3
  Filled 2012-12-22 (×2): qty 1

## 2012-12-22 MED ORDER — HYDROXYZINE HCL 25 MG PO TABS
25.0000 mg | ORAL_TABLET | Freq: Four times a day (QID) | ORAL | Status: AC | PRN
  Administered 2012-12-22 – 2012-12-25 (×4): 25 mg via ORAL
  Filled 2012-12-22: qty 1

## 2012-12-22 MED ORDER — CHLORDIAZEPOXIDE HCL 25 MG PO CAPS
25.0000 mg | ORAL_CAPSULE | Freq: Three times a day (TID) | ORAL | Status: AC
  Administered 2012-12-23 (×3): 25 mg via ORAL
  Filled 2012-12-22 (×3): qty 1

## 2012-12-22 MED ORDER — ONDANSETRON 4 MG PO TBDP
4.0000 mg | ORAL_TABLET | Freq: Four times a day (QID) | ORAL | Status: AC | PRN
  Administered 2012-12-22 – 2012-12-24 (×6): 4 mg via ORAL
  Filled 2012-12-22: qty 1

## 2012-12-22 MED ORDER — LEVETIRACETAM ER 500 MG PO TB24
500.0000 mg | ORAL_TABLET | Freq: Two times a day (BID) | ORAL | Status: DC
  Administered 2012-12-22 – 2012-12-26 (×8): 500 mg via ORAL
  Filled 2012-12-22 (×5): qty 1
  Filled 2012-12-22: qty 28
  Filled 2012-12-22 (×3): qty 1
  Filled 2012-12-22: qty 28
  Filled 2012-12-22 (×2): qty 1

## 2012-12-22 MED ORDER — CHLORDIAZEPOXIDE HCL 25 MG PO CAPS
25.0000 mg | ORAL_CAPSULE | ORAL | Status: AC
  Administered 2012-12-24 (×2): 25 mg via ORAL
  Filled 2012-12-22 (×2): qty 1

## 2012-12-22 MED ORDER — ACETAMINOPHEN 325 MG PO TABS
650.0000 mg | ORAL_TABLET | Freq: Four times a day (QID) | ORAL | Status: DC | PRN
  Administered 2012-12-22 – 2012-12-26 (×9): 650 mg via ORAL

## 2012-12-22 NOTE — BHH Counselor (Addendum)
Pt accepted by Se[facility] Colonel to [facility], bed 301-2. Can call report after 8:30 am.  Evette Cristal, [place] Assessment Counselor

## 2012-12-22 NOTE — ED Notes (Signed)
Patient is resting comfortably. 

## 2012-12-22 NOTE — ED Provider Notes (Signed)
Medical screening examination/treatment/procedure(s) were performed by non-physician practitioner and as supervising physician I was immediately available for consultation/collaboration.   Glynn Octave, MD 12/22/12 [phone]

## 2012-12-22 NOTE — Progress Notes (Signed)
D: Patient in her room in bed on approach.  Patient states she is going through withdrawals and patient states she is sweating..  Patient states she would just like to rest.  Patient states she is not going to attend group tonight.  Patient denies SI/HI and patient states she sees bugs flying and denies auditory hallucinations. A: Staff to monitor Q 15 mins for safety.  Encouragement and support offered.  Scheduled medications administered per orders.  Zofran administered prn for nausea and tylenol administered prn for a headache.  R: Patient remains safe on the unit.  Patient did not attend group tonight.  Patient cooperative, calm and taking administered medications.

## 2012-12-22 NOTE — ED Provider Notes (Signed)
Pt stable awaiting placement  Benny Lennert, MD 12/22/12 [phone]
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
## 2012-12-22 NOTE — Tx Team (Signed)
Initial Interdisciplinary Treatment Plan  PATIENT STRENGTHS: (choose at least two) Ability for insight Average or above average intelligence Capable of independent living General fund of knowledge Physical Health  PATIENT STRESSORS: Financial difficulties Marital or family conflict Medication change or noncompliance Substance abuse   PROBLEM LIST: Problem List/Patient Goals Date to be addressed Date deferred Reason deferred Estimated date of resolution  Can't hold a job d/t my drinking 12/19/2012     I am homeless  12/19/2012                                                DISCHARGE CRITERIA:  Ability to meet basic life and health needs Adequate post-discharge living arrangements Improved stabilization in mood, thinking, and/or behavior Motivation to continue treatment in a less acute level of care Need for constant or close observation no longer present Withdrawal symptoms are absent or subacute and managed without 24-hour nursing intervention  PRELIMINARY DISCHARGE PLAN: Attend aftercare/continuing care group Attend 12-step recovery group Outpatient therapy  PATIENT/FAMIILY INVOLVEMENT: This treatment plan has been presented to and reviewed with the patient, Jacqueline [name].  The patient has been given the opportunity to ask questions and make suggestions.  Jule Ser 12/22/2012, 7:26 PM

## 2012-12-22 NOTE — Clinical Social Work Note (Signed)
Pt accepted to [facility].  Aggie to [place] 301-2.  RN and EDP aware  Vickii Penna, LCSWA [phone]  Clinical Social Work

## 2012-12-22 NOTE — Progress Notes (Signed)
Chaplain saw pt on rounds.    Pt reported she had just arrived.   Introduced spiritual care as resource, develop rapport, and offered support around transition to [facility].    Will continue to follow and assess for support needs  Belva Crome
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
## 2012-12-22 NOTE — [facility] Suicide Risk Assessment (Signed)
Suicide Risk Assessment  Admission Assessment     Nursing information obtained from:    Demographic factors:    Current Mental Status:    Loss Factors:    Historical Factors:    Risk Reduction Factors:     CLINICAL FACTORS:   Severe Anxiety and/or Agitation Panic Attacks Depression:   Comorbid alcohol abuse/dependence Hopelessness Insomnia Alcohol/Substance Abuse/Dependencies  COGNITIVE FEATURES THAT CONTRIBUTE TO RISK:  Closed-mindedness Thought constriction (tunnel vision)    SUICIDE RISK:   Moderate:  Frequent suicidal ideation with limited intensity, and duration, some specificity in terms of plans, no associated intent, good self-control, limited dysphoria/symptomatology, some risk factors present, and identifiable protective factors, including available and accessible social support.  PLAN OF CARE: Supportive approach/coping skills/relapse prevention                              Librium Detox protocol                               Reassess the comorbidities  I certify that inpatient services furnished can reasonably be expected to improve the patient's condition.  Joscelyn Hardrick A 12/22/2012, 3:38 PM

## 2012-12-23 MED ORDER — SUMATRIPTAN SUCCINATE 50 MG PO TABS
50.0000 mg | ORAL_TABLET | Freq: Once | ORAL | Status: AC
  Administered 2012-12-23: 50 mg via ORAL
  Filled 2012-12-23: qty 1

## 2012-12-23 MED ORDER — NICOTINE 21 MG/24HR TD PT24
21.0000 mg | MEDICATED_PATCH | Freq: Every day | TRANSDERMAL | Status: DC
  Administered 2012-12-23 – 2012-12-26 (×4): 21 mg via TRANSDERMAL
  Filled 2012-12-23 (×7): qty 1

## 2012-12-23 MED ORDER — SERTRALINE HCL 50 MG PO TABS
50.0000 mg | ORAL_TABLET | Freq: Every day | ORAL | Status: DC
  Administered 2012-12-23 – 2012-12-24 (×2): 50 mg via ORAL
  Filled 2012-12-23 (×5): qty 1

## 2012-12-23 MED ORDER — DIPHENHYDRAMINE HCL 25 MG PO CAPS
50.0000 mg | ORAL_CAPSULE | Freq: Every evening | ORAL | Status: DC | PRN
  Administered 2012-12-23: 50 mg via ORAL

## 2012-12-23 MED ORDER — PANTOPRAZOLE SODIUM 40 MG PO TBEC
40.0000 mg | DELAYED_RELEASE_TABLET | Freq: Every day | ORAL | Status: DC
  Administered 2012-12-23 – 2012-12-26 (×4): 40 mg via ORAL
  Filled 2012-12-23: qty 1
  Filled 2012-12-23: qty 14
  Filled 2012-12-23 (×4): qty 1

## 2012-12-23 NOTE — Progress Notes (Signed)
Recreation Therapy Notes  Date: 03.18.2014  Time: 3:00pm  Location: [facility]   Group Topic/Focus: Goal Setting   Participation Level:  Did not attend   Jearl Klinefelter, LRT/CTRS   Jearl Klinefelter 12/23/2012 3:38 PM
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
## 2012-12-23 NOTE — Progress Notes (Addendum)
[facility] LCSW Group Therapy  12/23/2012 1:15 PM  Type of Therapy:  Group Therapy 1:15 to 2:30   Participation Level:   Minimal  Participation Quality:  Attentive and Sharing  Affect:   Depressed and Flat  Cognitive:  Oriented  Insight:  Limited  Engagement in Therapy:  Limited   Modes of Intervention:  Clarification, Exploration and Support  Summary of Progress/Problems: Warmup portion of group allowed patient's to choose from multiple emotions as to how they are feeling today. Emotions listed ranged from anger, denial, loneliness, hopeful, guilty, shamed, happiness, guilt and hopelessness. Patients were then asked if those same emotions might transfer into how they feel about their diagnosis.  Kim processed how she currently feels hopeless about her diagnosis and believes death would be an easier way out.  Patient agreed to contract for safety.   [name]

## 2012-12-23 NOTE — Progress Notes (Signed)
D: Patient in bed on approach.  Patient states she had a bad day because she states she is continuing to go through withdrawals.  Patient states she is ready for the withdrawals to go away.  Patient states she is having passive SI and denies HI and denies AVH.  Patient verbally contracts for safety.   A: Staff to monitor Q 15 mins for safety.  Encouragement and support offered.  Scheduled medications administered.  Librium administered prn for withdrawals and benadryl administered prn for sleep. R: Patient remains safe on the unit.  Patient did not attend group tonight.  Patient taking administered medications.

## 2012-12-23 NOTE — Progress Notes (Signed)
[facility] MD Progress Note  12/23/2012 1:04 PM Jacqueline [name]  MRN:  [id number] Subjective:  Continues to have a hard time. She is having a migraine. She is still exhibiting withdrawal. Concerned about not being on her Zoloft. She states she is trying to get her life back together and right now she has no idea where to go from here. Did not sleep as well last night either. The headache is really affecting her this morning. Diagnosis:  Alcohol Dependence/withdrawal, Cocaine Abuse, Anxiety Disorder  ADL's:  Intact  Sleep: Poor  Appetite:  Poor  Suicidal Ideation:  Plan:  Denies plans Intent:  denies Means:  denies Homicidal Ideation:  Plan:  denies Intent:  denies Means:  denies AEB (as evidenced by):  Psychiatric Specialty Exam: Review of Systems  Constitutional: Positive for malaise/fatigue.  Eyes: Positive for pain.  Respiratory: Negative.   Cardiovascular: Negative.   Gastrointestinal: Positive for nausea.  Genitourinary: Negative.   Musculoskeletal: Negative.   Skin: Negative.   Neurological: Positive for tremors, weakness and headaches.  Endo/Heme/Allergies: Negative.   Psychiatric/Behavioral: Positive for depression and substance abuse. The patient is nervous/anxious.     Blood pressure 108/78, pulse 116, temperature 98.3 F (36.8 C), temperature source Oral, resp. rate 18, height 5\' 9"  (1.753 m), weight 81.647 kg (180 lb), last menstrual period 12/17/2012.Body mass index is 26.57 kg/(m^2).  General Appearance: Disheveled  Eye Contact::  Minimal(light sensitive)  Speech:  Clear and Coherent  Volume:  Decreased  Mood:  Anxious, Depressed and in pain  Affect:  in pain  Thought Process:  Coherent and Goal Directed  Orientation:  Full (Time, Place, and Person)  Thought Content:  worries, concerns, focused on her headache  Suicidal Thoughts:  Yes.  without intent/plan  Homicidal Thoughts:  No  Memory:  Immediate;   Fair Recent;   Fair Remote;   Fair  Judgement:  Fair   Insight:  Present  Psychomotor Activity:  Restlessness  Concentration:  Fair  Recall:  Fair  Akathisia:  No  Handed:  Right  AIMS (if indicated):     Assets:  Desire for Improvement  Sleep:  Number of Hours: 4.75   Current Medications: Current Facility-Administered Medications  Medication Dose Route Frequency Provider Last Rate Last Dose  . acetaminophen (TYLENOL) tablet 650 mg  650 mg Oral Q6H PRN Sanjuana Kava, NP   650 mg at 12/23/12 0604  . alum & mag hydroxide-simeth (MAALOX/MYLANTA) 200-200-20 MG/5ML suspension 30 mL  30 mL Oral Q4H PRN Sanjuana Kava, NP      . chlordiazePOXIDE (LIBRIUM) capsule 25 mg  25 mg Oral Q6H PRN Sanjuana Kava, NP   25 mg at 12/23/12 0449  . chlordiazePOXIDE (LIBRIUM) capsule 25 mg  25 mg Oral TID Rachael Fee, MD   25 mg at 12/23/12 1156   Followed by  . [START ON 12/24/2012] chlordiazePOXIDE (LIBRIUM) capsule 25 mg  25 mg Oral BH-qamhs Rachael Fee, MD       Followed by  . [START ON 12/25/2012] chlordiazePOXIDE (LIBRIUM) capsule 25 mg  25 mg Oral Daily Rachael Fee, MD      . hydrOXYzine (ATARAX/VISTARIL) tablet 25 mg  25 mg Oral Q6H PRN Sanjuana Kava, NP   25 mg at 12/23/12 0310  . levETIRAcetam (KEPPRA XR) 24 hr tablet 500 mg  500 mg Oral Q12H Sanjuana Kava, NP   500 mg at 12/23/12 0757  . levothyroxine (SYNTHROID, LEVOTHROID) tablet 112 mcg  112 mcg Oral  QAC breakfast Sanjuana Kava, NP   112 mcg at 12/23/12 0604  . loperamide (IMODIUM) capsule 2-4 mg  2-4 mg Oral PRN Sanjuana Kava, NP      . magnesium hydroxide (MILK OF MAGNESIA) suspension 30 mL  30 mL Oral Daily PRN Sanjuana Kava, NP      . multivitamin with minerals tablet 1 tablet  1 tablet Oral Daily Sanjuana Kava, NP   1 tablet at 12/23/12 0757  . nicotine (NICODERM CQ - dosed in mg/24 hours) patch 21 mg  21 mg Transdermal Daily Rachael Fee, MD   21 mg at 12/23/12 1039  . ondansetron (ZOFRAN-ODT) disintegrating tablet 4 mg  4 mg Oral Q6H PRN Sanjuana Kava, NP   4 mg at 12/23/12 0604  .  pantoprazole (PROTONIX) EC tablet 40 mg  40 mg Oral Daily Rachael Fee, MD   40 mg at 12/23/12 1039  . thiamine (B-1) injection 100 mg  100 mg Intramuscular Once Sanjuana Kava, NP      . thiamine (VITAMIN B-1) tablet 100 mg  100 mg Oral Daily Sanjuana Kava, NP   100 mg at 12/23/12 0757    Lab Results: No results found for this or any previous visit (from the past 48 hour(s)).  Physical Findings: AIMS: Facial and Oral Movements Muscles of Facial Expression: None, normal Lips and Perioral Area: None, normal Jaw: None, normal Tongue: None, normal,Extremity Movements Upper (arms, wrists, hands, fingers): None, normal Lower (legs, knees, ankles, toes): None, normal, Trunk Movements Neck, shoulders, hips: None, normal,  , Dental Status Current problems with teeth and/or dentures?: No Does patient usually wear dentures?: No  CIWA:  CIWA-Ar Total: 7 COWS:     Treatment Plan Summary: Daily contact with patient to assess and evaluate symptoms and progress in treatment Medication management  Plan: Supportive approach/coping skills/relapse prevention           Continue Librium Detox protocol           Imitrex 50 mg PO with a second dose 2 hrs later if not effective Medical Decision Making Problem Points:  Review of psycho-social stressors (1) Data Points:  Review of medication regiment & side effects (2) Review of new medications or change in dosage (2)  I certify that inpatient services furnished can reasonably be expected to improve the patient's condition.   Olis Viverette A 12/23/2012, 1:04 PM

## 2012-12-23 NOTE — Progress Notes (Signed)
D: Patient denies HI and  admits to thoughts of SI patient denies auditory hallucinations and now denies visual hallucinations even though she reported visual hallucinations before; patient reports she requested medication for sleep and that it was poor; reports appetite is poor ; reports energy level is low ; reports ability to pay attention is poor; rates depression as 9/10; rates hopelessness 9/10; complaints of a headache  A: Monitored q 15 minutes; patient encouraged to attend groups; patient educated about medications; patient given medications per physician orders; patient encouraged to express feelings and/or concerns  R: Patient still has complaints of some withdrawal symptoms but reports they are decreasing and also have been observed; patient's interaction with staff and peers is appropriate but very minimal; patient was able to set goal to talk with staff 1:1 when having feelings of SI; patient is taking medications as prescribed and tolerating medications; patient is not attending all groups because of the headaches today

## 2012-12-23 NOTE — Progress Notes (Signed)
Patient did not attend group.

## 2012-12-23 NOTE — Progress Notes (Signed)
[facility] LCSW Aftercare Discharge Planning Group Note  12/23/2012 11:00 AM  Participation Quality:  Did Not Attend   [name]

## 2012-12-24 DIAGNOSIS — F329 Major depressive disorder, single episode, unspecified: Secondary | ICD-10-CM

## 2012-12-24 MED ORDER — ACAMPROSATE CALCIUM 333 MG PO TBEC
666.0000 mg | DELAYED_RELEASE_TABLET | Freq: Three times a day (TID) | ORAL | Status: DC
  Administered 2012-12-24 – 2012-12-26 (×6): 666 mg via ORAL
  Filled 2012-12-24: qty 84
  Filled 2012-12-24 (×3): qty 2
  Filled 2012-12-24: qty 84
  Filled 2012-12-24 (×3): qty 2
  Filled 2012-12-24: qty 84
  Filled 2012-12-24 (×4): qty 2

## 2012-12-24 MED ORDER — MIRTAZAPINE 15 MG PO TABS
15.0000 mg | ORAL_TABLET | Freq: Every day | ORAL | Status: DC
  Administered 2012-12-24 – 2012-12-25 (×2): 15 mg via ORAL
  Filled 2012-12-24 (×3): qty 1
  Filled 2012-12-24: qty 14

## 2012-12-24 MED ORDER — SERTRALINE HCL 100 MG PO TABS
100.0000 mg | ORAL_TABLET | Freq: Every day | ORAL | Status: DC
  Administered 2012-12-25 – 2012-12-26 (×2): 100 mg via ORAL
  Filled 2012-12-24: qty 1
  Filled 2012-12-24: qty 14
  Filled 2012-12-24 (×2): qty 1

## 2012-12-24 MED ORDER — RISPERIDONE 0.25 MG PO TABS
0.2500 mg | ORAL_TABLET | Freq: Two times a day (BID) | ORAL | Status: DC
  Administered 2012-12-24 – 2012-12-25 (×2): 0.25 mg via ORAL
  Filled 2012-12-24 (×7): qty 1

## 2012-12-24 NOTE — Progress Notes (Signed)
Pt attended the evening AA group. 
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
## 2012-12-24 NOTE — Progress Notes (Signed)
Nutrition Brief Note  Patient identified on the Malnutrition Screening Tool (MST) Report  Body mass index is 26.57 kg/(m^2). Patient meets criteria for overweight based on current BMI.   Spoke with patient who stated she is eating well and regaining lost weight.  Current diet order is regular, patient with good intake of meals at this time. Labs and medications reviewed.   No nutrition interventions warranted at this time. If nutrition issues arise, please consult RD.   Oran Rein, RD, LDN Clinical Inpatient Dietitian Pager:  [phone] Weekend and after hours pager:  [phone]

## 2012-12-24 NOTE — Progress Notes (Signed)
D: Patient in the dayroom on approach.  Patient states she is feeling a little better.  Patient states she feels that the Librium is working well.  Patient states she does not feel that she has learned anything yet.  Patient states she needs to stop eating so much because it makes her stomach hurt.  Patient denies  SI/HI and AVH. A: Staff to monitor Q 15 mins for safety.  Encouragement and support offered.  Scheduled medications administered per orders.   R: Patient remains safe on the unit.  Patient attended group tonight.  Patient calm, cooperative and taking administered medications.  Patient more visible on the unit tonight.

## 2012-12-24 NOTE — Progress Notes (Signed)
[facility] LCSW Aftercare Discharge Planning Group Note  12/24/2012 8:45 AM  Participation Quality:  Attentive  Affect:  Depressed and Flat  Cognitive:  Alert and Oriented  Insight:  Limited  Engagement in Group:  Limited  Modes of Intervention:  Exploration, Socialization and Support  Summary of Progress/Problems: Pt denies both suicidal and homicidal ideation; CSW believes we need to continue to assess for SI. On a scale of 1 to 10 with ten being the most ever experienced, the patient rates depression at an 8 and anxiety at a 6.  Patient requests CSW call [facility] and speak to Chrystie Nose on her behalf. Patient also open to [facility] as family will pay the initial $300 deposit although she knows that will not cover future rent and she has no income.  Patient reports physical exhaustion.    Jacqueline [name]

## 2012-12-24 NOTE — Progress Notes (Signed)
Adult Psychoeducational Group Note  Date:  12/24/2012 Time:  6:43 PM  Group Topic/Focus:  Personal Choices and Values:   The focus of this group is to help patients assess and explore the importance of values in their lives, how their values affect their decisions, how they express their values and what opposes their expression.  Participation Level:  Active  Participation Quality:  Appropriate  Affect:  Appropriate  Cognitive:  Appropriate  Insight: Appropriate  Engagement in Group:  Engaged  Modes of Intervention:  Discussion and Education  Additional Comments:  Pt actively participated in group by completing worksheets associated with identifying values and modifying behavior in order to make one's actions consistent with his/her values. Pt attended to group discussion and was supportive of peers sharing.  Reinaldo Raddle K 12/24/2012, 6:43 PM

## 2012-12-24 NOTE — Progress Notes (Signed)
[facility] MD Progress Note  12/24/2012 4:15 PM Jacqueline [name]  MRN:  [id number] Subjective:  [name] is still withdrawing. She is experiencing mood instability and anxiety. She endorses that the Zoloft has helped to contain the depression, but that has not worked with the anxiety. She still has a lot of anxiety that interferes with her ability to be out and  about, interact with people. She states that alcohol ( or Klonopin ) help the anxiety better. Understand she cant drink. She cant control it. She endorses no support. Her mother does not want to do anything for her, her oldest daughter does not want to talk to her. Feels that this is "rock bottom." She is aware that she is killing herself slowly. Would like medications for cravings Diagnosis:  Alcohol Dependence/Withdrawal, Cocaine Abuse, Anxiety Disorder, Major Depression  ADL's:  Intact  Sleep: Poor  Appetite:  Poor  Suicidal Ideation:  Plan:  denies Intent:  denies Means:  denies Homicidal Ideation:  Plan:  denies Intent:  denies Means:  denies AEB (as evidenced by):  Psychiatric Specialty Exam: Review of Systems  Constitutional: Negative.   HENT: Negative.   Eyes: Negative.   Respiratory: Negative.   Cardiovascular: Negative.   Gastrointestinal: Negative.   Genitourinary: Negative.   Musculoskeletal: Negative.   Skin: Negative.   Neurological: Positive for dizziness.  Endo/Heme/Allergies: Negative.   Psychiatric/Behavioral: Positive for depression and substance abuse. The patient is nervous/anxious and has insomnia.     Blood pressure 107/75, pulse 81, temperature 98.9 F (37.2 C), temperature source Oral, resp. rate 16, height 5\' 9"  (1.753 m), weight 81.647 kg (180 lb), last menstrual period 12/17/2012.Body mass index is 26.57 kg/(m^2).  General Appearance: Fairly Groomed  Patent attorney::  Fair  Speech:  Clear and Coherent  Volume:  Normal  Mood:  Anxious, Depressed and worried  Affect:  worried  Thought Process:   Coherent and Goal Directed  Orientation:  Full (Time, Place, and Person)  Thought Content:  worries, concerns  Suicidal Thoughts:  No  Homicidal Thoughts:  No  Memory:  Immediate;   Fair Recent;   Fair Remote;   Fair  Judgement:  Fair  Insight:  Present  Psychomotor Activity:  Restlessness  Concentration:  Fair  Recall:  Fair  Akathisia:  No  Handed:  Right  AIMS (if indicated):     Assets:  Desire for Improvement  Sleep:  Number of Hours: 4   Current Medications: Current Facility-Administered Medications  Medication Dose Route Frequency Provider Last Rate Last Dose  . acamprosate (CAMPRAL) tablet 666 mg  666 mg Oral TID WC Rachael Fee, MD      . acetaminophen (TYLENOL) tablet 650 mg  650 mg Oral Q6H PRN Sanjuana Kava, NP   650 mg at 12/24/12 1133  . alum & mag hydroxide-simeth (MAALOX/MYLANTA) 200-200-20 MG/5ML suspension 30 mL  30 mL Oral Q4H PRN Sanjuana Kava, NP      . chlordiazePOXIDE (LIBRIUM) capsule 25 mg  25 mg Oral Q6H PRN Sanjuana Kava, NP   25 mg at 12/24/12 0455  . chlordiazePOXIDE (LIBRIUM) capsule 25 mg  25 mg Oral BH-qamhs Rachael Fee, MD   25 mg at 12/24/12 3086   Followed by  . [START ON 12/25/2012] chlordiazePOXIDE (LIBRIUM) capsule 25 mg  25 mg Oral Daily Rachael Fee, MD      . diphenhydrAMINE (BENADRYL) capsule 50 mg  50 mg Oral QHS PRN,MR X 1 Kerry Hough, PA-C   50  mg at 12/23/12 2139  . hydrOXYzine (ATARAX/VISTARIL) tablet 25 mg  25 mg Oral Q6H PRN Sanjuana Kava, NP   25 mg at 12/24/12 1133  . levETIRAcetam (KEPPRA XR) 24 hr tablet 500 mg  500 mg Oral Q12H Sanjuana Kava, NP   500 mg at 12/24/12 0813  . levothyroxine (SYNTHROID, LEVOTHROID) tablet 112 mcg  112 mcg Oral QAC breakfast Sanjuana Kava, NP   112 mcg at 12/24/12 0636  . loperamide (IMODIUM) capsule 2-4 mg  2-4 mg Oral PRN Sanjuana Kava, NP   4 mg at 12/24/12 1131  . magnesium hydroxide (MILK OF MAGNESIA) suspension 30 mL  30 mL Oral Daily PRN Sanjuana Kava, NP      . mirtazapine (REMERON)  tablet 15 mg  15 mg Oral QHS Rachael Fee, MD      . multivitamin with minerals tablet 1 tablet  1 tablet Oral Daily Sanjuana Kava, NP   1 tablet at 12/23/12 0757  . nicotine (NICODERM CQ - dosed in mg/24 hours) patch 21 mg  21 mg Transdermal Daily Rachael Fee, MD   21 mg at 12/24/12 0815  . ondansetron (ZOFRAN-ODT) disintegrating tablet 4 mg  4 mg Oral Q6H PRN Sanjuana Kava, NP   4 mg at 12/24/12 1131  . pantoprazole (PROTONIX) EC tablet 40 mg  40 mg Oral Daily Rachael Fee, MD   40 mg at 12/24/12 0813  . risperiDONE (RISPERDAL) tablet 0.25 mg  0.25 mg Oral BID Rachael Fee, MD      . Melene Muller ON 12/25/2012] sertraline (ZOLOFT) tablet 100 mg  100 mg Oral Daily Rachael Fee, MD      . thiamine (B-1) injection 100 mg  100 mg Intramuscular Once Sanjuana Kava, NP      . thiamine (VITAMIN B-1) tablet 100 mg  100 mg Oral Daily Sanjuana Kava, NP   100 mg at 12/24/12 4540    Lab Results: No results found for this or any previous visit (from the past 48 hour(s)).  Physical Findings: AIMS: Facial and Oral Movements Muscles of Facial Expression: None, normal Lips and Perioral Area: None, normal Jaw: None, normal Tongue: None, normal,Extremity Movements Upper (arms, wrists, hands, fingers): None, normal Lower (legs, knees, ankles, toes): None, normal, Trunk Movements Neck, shoulders, hips: None, normal,  , Dental Status Current problems with teeth and/or dentures?: No Does patient usually wear dentures?: No  CIWA:  CIWA-Ar Total: 7 COWS:     Treatment Plan Summary: Daily contact with patient to assess and evaluate symptoms and progress in treatment Medication management  Plan: Supportive approach/coping skills/relapse prevention           Continue detox           Will increase the Zoloft to 100 mg daily                                       Add Risperdal 0.25 mg BID            Will start Campral 333 mg two TID Medical Decision Making Problem Points:  Review of psycho-social stressors  (1) Data Points:  Review of medication regiment & side effects (2) Review of new medications or change in dosage (2)  I certify that inpatient services furnished can reasonably be expected to improve the patient's condition.   Arionna Hoggard A 12/24/2012, 4:15  PM

## 2012-12-24 NOTE — Tx Team (Signed)
Interdisciplinary Treatment Plan Update (Adult)  Date: 12/24/2012  Time Reviewed: 10:14 AM   Progress in Treatment: Attending groups: Yes Participating in groups: Yes Taking medication as prescribed:  Yes Tolerating medication:  Yes Family/Significant othe contact made: No Patient understands diagnosis: Yes Discussing patient identified problems/goals with staff: Yes Medical problems stabilized or resolved:  Yes Denies suicidal/homicidal ideation: Yes, but will continue to assess Patient has not harmed self or Others: Yes  New problem(s) identified: None Identified  Discharge Plan or Barriers:  CSW is assessing for appropriate referrals. [facility] to [facility]  Additional comments: N/A  Reason for Continuation of Hospitalization: Depression Medication stabilization Suicidal ideation Withdrawal symptoms   Estimated length of stay: 3-5 days  For review of initial/current patient goals, please see plan of care.  Attendees: Patient:     Family:     Physician:  Geoffery Lyons 12/24/2012 10:14 AM   Nursing:  Roswell Miners, RN  12/24/2012 10:14 AM   Clinical Social Worker Keyonni Percival 12/24/2012 10:14 AM   Other:   Stephan Minister Liason 12/24/2012 10:14 AM   Other:  Concha Se, [place] PA Student 12/24/2012 10:14 AM   Other:  Robbie Louis, RN 12/24/2012 10:14 AM   Other:  Serena Colonel, PA  12/24/2012 10:14 AM    Scribe for Treatment Team:   Carney Bern, LCSWA  12/24/2012 10:14  AM

## 2012-12-24 NOTE — Progress Notes (Signed)
Recreation Therapy Notes  Date: 03.19.2014 Time: 2:45pm Location: [address] Day [address]      Group Topic/Focus: Musician (AAA/T)  Participation Level: Active  Participation Quality: Appropriate  Affect: Euthymic  Cognitive: Appropriate    Additional Comments: Today's AAA/T session was a Animal Assisted Activities (AAA) session. Dog Team: Summer and handler.   Patient with peers visited with Summer. Patient pet Summer. Patient fed Summer a treat. Patient asked appropriate questions about Summer's training.    Marykay Lex Aubre Quincy, LRT/CTRS   Jearl Klinefelter 12/24/2012 4:03 PM

## 2012-12-24 NOTE — Progress Notes (Signed)
Patient ID: Jacqueline [name], female   DOB: 1965-12-20, 47 y.o.   MRN: [id number] She has been up and about more today and has been interacting with peers and staff. She has requested and received prn medications for anxiety and w/d symptoms.  Self inventory: Depression 8, hopelessness 8,  C/o withdrawal symptoms,positive SI thoughts.

## 2012-12-25 DIAGNOSIS — F411 Generalized anxiety disorder: Secondary | ICD-10-CM

## 2012-12-25 DIAGNOSIS — F1410 Cocaine abuse, uncomplicated: Secondary | ICD-10-CM

## 2012-12-25 DIAGNOSIS — F339 Major depressive disorder, recurrent, unspecified: Secondary | ICD-10-CM

## 2012-12-25 MED ORDER — HYDROXYZINE HCL 25 MG PO TABS
25.0000 mg | ORAL_TABLET | Freq: Four times a day (QID) | ORAL | Status: DC | PRN
  Administered 2012-12-25 – 2012-12-26 (×2): 25 mg via ORAL

## 2012-12-25 MED ORDER — RISPERIDONE 0.5 MG PO TABS
0.5000 mg | ORAL_TABLET | Freq: Two times a day (BID) | ORAL | Status: DC
  Administered 2012-12-25 – 2012-12-26 (×2): 0.5 mg via ORAL
  Filled 2012-12-25 (×3): qty 1
  Filled 2012-12-25: qty 28
  Filled 2012-12-25: qty 1
  Filled 2012-12-25: qty 28

## 2012-12-25 NOTE — Progress Notes (Signed)
[facility] MD Progress Note  12/25/2012 4:18 PM Jacqueline [name]  MRN:  [id number] Subjective:  Jacqueline [name] endorses that she is feeling increasingly more anxious, depressed, overwhelmed. She has no support from her family. States that she feels very fragile, not sure if she can make it. She still reports anxiety, mood instability. She did sleep better with the Remeron. She requested something for her anxiety as she cant deal with it on her own. The vistaril order expired Diagnosis:  Major Depression recurrent, Alcohol Dependence/withdrawal Cocaine Abuse, GAD  ADL's:  Intact  Sleep: Fair  Appetite:  Poor  Suicidal Ideation:  Plan:  ideas, no plans Intent:  denies Means:  denies Homicidal Ideation:  Plan:  denies Intent:  denies Means:  denies AEB (as evidenced by):  Psychiatric Specialty Exam: Review of Systems  Constitutional: Positive for malaise/fatigue.  HENT: Negative.   Eyes: Negative.   Respiratory: Negative.   Cardiovascular: Negative.   Gastrointestinal: Negative.   Genitourinary: Negative.   Musculoskeletal: Negative.   Skin: Negative.   Neurological: Positive for weakness.  Endo/Heme/Allergies: Negative.   Psychiatric/Behavioral: Positive for depression and substance abuse. The patient is nervous/anxious and has insomnia.     Blood pressure 111/74, pulse 91, temperature 99.4 F (37.4 C), temperature source Oral, resp. rate 24, height 5\' 9"  (1.753 m), weight 81.647 kg (180 lb), last menstrual period 12/17/2012.Body mass index is 26.57 kg/(m^2).  General Appearance: Fairly Groomed  Patent attorney::  Fair  Speech:  Clear and Coherent, Slow and not spontaneous  Volume:  Decreased  Mood:  Anxious and Depressed  Affect:  Depressed and Tearful  Thought Process:  Coherent and Goal Directed  Orientation:  Full (Time, Place, and Person)  Thought Content:  worries, concerns  Suicidal Thoughts:  Yes.  without intent/plan, intermittent when she thinks about her situation, the lack  of supporr  Homicidal Thoughts:  No  Memory:  Immediate;   Fair Recent;   Fair Remote;   Fair  Judgement:  Fair  Insight:  Present  Psychomotor Activity:  Restlessness  Concentration:  Fair  Recall:  Fair  Akathisia:  No  Handed:  Right  AIMS (if indicated):     Assets:  Desire for Improvement  Sleep:  Number of Hours: 6   Current Medications: Current Facility-Administered Medications  Medication Dose Route Frequency Provider Last Rate Last Dose  . acamprosate (CAMPRAL) tablet 666 mg  666 mg Oral TID WC Rachael Fee, MD   666 mg at 12/25/12 1202  . acetaminophen (TYLENOL) tablet 650 mg  650 mg Oral Q6H PRN Sanjuana Kava, NP   650 mg at 12/25/12 1542  . alum & mag hydroxide-simeth (MAALOX/MYLANTA) 200-200-20 MG/5ML suspension 30 mL  30 mL Oral Q4H PRN Sanjuana Kava, NP   30 mL at 12/25/12 1406  . diphenhydrAMINE (BENADRYL) capsule 50 mg  50 mg Oral QHS PRN,MR X 1 Spencer E Simon, PA-C   50 mg at 12/23/12 2139  . hydrOXYzine (ATARAX/VISTARIL) tablet 25 mg  25 mg Oral Q6H PRN Rachael Fee, MD   25 mg at 12/25/12 1542  . levETIRAcetam (KEPPRA XR) 24 hr tablet 500 mg  500 mg Oral Q12H Sanjuana Kava, NP   500 mg at 12/25/12 0813  . levothyroxine (SYNTHROID, LEVOTHROID) tablet 112 mcg  112 mcg Oral QAC breakfast Sanjuana Kava, NP   112 mcg at 12/25/12 4098  . magnesium hydroxide (MILK OF MAGNESIA) suspension 30 mL  30 mL Oral Daily PRN Sanjuana Kava,  NP      . mirtazapine (REMERON) tablet 15 mg  15 mg Oral QHS Rachael Fee, MD   15 mg at 12/24/12 2132  . multivitamin with minerals tablet 1 tablet  1 tablet Oral Daily Sanjuana Kava, NP   1 tablet at 12/23/12 0757  . nicotine (NICODERM CQ - dosed in mg/24 hours) patch 21 mg  21 mg Transdermal Daily Rachael Fee, MD   21 mg at 12/25/12 1610  . pantoprazole (PROTONIX) EC tablet 40 mg  40 mg Oral Daily Rachael Fee, MD   40 mg at 12/25/12 0813  . risperiDONE (RISPERDAL) tablet 0.5 mg  0.5 mg Oral BID Rachael Fee, MD      . sertraline  (ZOLOFT) tablet 100 mg  100 mg Oral Daily Rachael Fee, MD   100 mg at 12/25/12 0813  . thiamine (B-1) injection 100 mg  100 mg Intramuscular Once Sanjuana Kava, NP      . thiamine (VITAMIN B-1) tablet 100 mg  100 mg Oral Daily Sanjuana Kava, NP   100 mg at 12/25/12 9604    Lab Results: No results found for this or any previous visit (from the past 48 hour(s)).  Physical Findings: AIMS: Facial and Oral Movements Muscles of Facial Expression: None, normal Lips and Perioral Area: None, normal Jaw: None, normal Tongue: None, normal,Extremity Movements Upper (arms, wrists, hands, fingers): None, normal Lower (legs, knees, ankles, toes): None, normal, Trunk Movements Neck, shoulders, hips: None, normal,  , Dental Status Current problems with teeth and/or dentures?: No Does patient usually wear dentures?: No  CIWA:  CIWA-Ar Total: 5 COWS:     Treatment Plan Summary: Daily contact with patient to assess and evaluate symptoms and progress in treatment Medication management Resume Vistaril 25 mg every 6 hrs as needed Increase the Risperdal to 0.5 mg BID  Plan: Supportive approach/coping skills/relapse prevention  Medical Decision Making Problem Points:  Review of psycho-social stressors (1) Data Points:  Review of medication regiment & side effects (2) Review of new medications or change in dosage (2)  I certify that inpatient services furnished can reasonably be expected to improve the patient's condition.   Jacqueline [name] A 12/25/2012, 4:18 PM

## 2012-12-25 NOTE — Progress Notes (Signed)
[facility] LCSW Group Therapy  12/25/2012   Type of Therapy:  Group Therapy at 1:15 to 2:30 PM  Participation Level:  Minimal  Participation Quality: Minimal  Affect: Flat and depressed    Cognitive:  Alert and Oriented    Insight: Limited  Engagement in Therapy:  Minimal  Modes of Intervention: Discussion and exploration  Summary of Progress/Problems: Group topic pf balance in life was introduced by facilitator with a reading about dysfunctional families. Group members were able to relate to relationships in which people don't regularly get their needs met due to patterns of behavior which often inclde not talking, not feeling, not asking for what we need, and not trusting. Jacqueline [name] shared only when asked about trust issues, stating she has not one she can trust, not even herself.  When asked what it would feel like to trust herself patient became emotional and soon left group room.    [name]

## 2012-12-25 NOTE — Progress Notes (Signed)
Patient ID: Jacqueline [name], female   DOB: [birth year], 47 y.o.   MRN: [id number] She has been up and to groups interacting with peers and staff. Self inventory: depression 8, hopelessness 8 , SI on and off, contracts for safety.  He reports that she has w/d symptoms   Of chilling, agitation, and a headache/ Has only requested Maalox for indigestion.

## 2012-12-25 NOTE — Progress Notes (Signed)
D. Pt has been up and has been visible this evening, has attended and participated in various milieu activities. Pt has endorsed that she has been feeling anxious and depressed. Pt does have some passive SI but able to contract for safety on the unit. Pt has received medications without incident. A. Support and encouragement provided. R. Will continue to monitor.

## 2012-12-25 NOTE — Progress Notes (Signed)
[facility] LCSW Aftercare Discharge Planning Group Note  12/25/2012  8:45 AM   Participation Quality:  Attentive and Sharing  Affect:  Anxious and Flat  Cognitive:  Alert and Oriented  Insight:  Improving  Engagement in Group:  Limited  Modes of Intervention:  Clarification, Exploration, Rapport Building, Socialization and Support  Summary of Progress/Problems:  On a scale of 1 to 10 with ten being the most ever experienced, the patient rates depression at an 8, hopelessness at a 7 and anxiety at a 7. [name] does report she has three new medications started and was able to get some sleep last night.  Patient continues to request CSW to contact Alexander at [employer].  Patient also discussed mother's willingness to help financially if she went to [facility] in [place] .    Denetra Formoso, Julious Payer  Addendum:  Patient again spoke with CSW requesting CSW contact Barbara Cower at [facility] and ask for special consideration.  As CSW has made 3 attempts to contact [facility] specifically without success and  explained the protocol for getting into [facility] (simply show up for group at 9 AM on weekdays) patient was given number so she cancall directly.

## 2012-12-25 NOTE — Progress Notes (Signed)
Patient did attend the evening karaoke group but left after thirty minutes. Pt reported being started on two new medicines and felt really sleepy. Pt returned two unit and went to sleep.

## 2012-12-25 NOTE — Progress Notes (Signed)
Recreation Therapy Notes  Date: 03.20.2014 Time: 3:00pm Location: [facility] Art Room      Group Topic/Focus: Goal Setting  Participation Level: Did not attend  Jasyah Theurer L Kaylor Maiers, LRT/CTRS  Donnabelle Blanchard L 12/25/2012 3:30 PM 

## 2012-12-25 NOTE — Progress Notes (Signed)
Adult Psychoeducational Group Note  Date:  12/25/2012 Time:  2:33 PM  Group Topic/Focus:  Overcoming Stress:   The focus of this group is to define stress and help patients assess their triggers.  Participation Level:  Active  Participation Quality:  Appropriate  Affect:  Appropriate  Cognitive:  Appropriate  Insight: Good  Engagement in Group:  Engaged  Modes of Intervention:  Discussion, Education, Exploration and Support  Additional Comments:  Pt actively participated in group by completing "Stress Interview" handout with staff. Pt noted that recently she has coped with stress in her life by abusing alcohol. Pt noted that this is only a quick/temporary solution. Pt verbalized that she has tried talking to friends to relieve stress in the past, but her depression has caused her to isolate and neglect past friendships, so now she feels she has no one to talk to. Pt acknowledged that she can build new positive relationships in [employer]. Pt named many positive coping skills she can use in the future to deal with stress.    Reinaldo Raddle K 12/25/2012, 2:33 PM

## 2012-12-26 MED ORDER — NICOTINE 21 MG/24HR TD PT24: 1.0000 | MEDICATED_PATCH | Freq: Every day | TRANSDERMAL | Status: DC

## 2012-12-26 MED ORDER — MIRTAZAPINE 15 MG PO TABS: 15.0000 mg | ORAL_TABLET | Freq: Every day | ORAL | Status: DC

## 2012-12-26 MED ORDER — LEVETIRACETAM ER 500 MG PO TB24: 500.0000 mg | ORAL_TABLET | Freq: Two times a day (BID) | ORAL | Status: DC

## 2012-12-26 MED ORDER — LEVOTHYROXINE SODIUM 112 MCG PO TABS: 112.0000 ug | ORAL_TABLET | Freq: Every day | ORAL | Status: DC

## 2012-12-26 MED ORDER — OMEPRAZOLE 20 MG PO CPDR: 40.0000 mg | DELAYED_RELEASE_CAPSULE | Freq: Every day | ORAL | Status: DC

## 2012-12-26 MED ORDER — ACAMPROSATE CALCIUM 333 MG PO TBEC: 666.0000 mg | DELAYED_RELEASE_TABLET | Freq: Three times a day (TID) | ORAL | Status: DC

## 2012-12-26 MED ORDER — SERTRALINE HCL 100 MG PO TABS: 100.0000 mg | ORAL_TABLET | Freq: Every day | ORAL | Status: DC

## 2012-12-26 MED ORDER — TUBERCULIN PPD 5 UNIT/0.1ML ID SOLN: 5.0000 [IU] | Freq: Once | INTRADERMAL | Status: DC

## 2012-12-26 MED ORDER — RISPERIDONE 0.5 MG PO TABS: 0.5000 mg | ORAL_TABLET | Freq: Two times a day (BID) | ORAL | Status: DC

## 2012-12-26 NOTE — Progress Notes (Signed)
[facility] LCSW Aftercare Discharge Planning Group Note  8:45 AM Participation Quality:  Sharing  Affect:  Depressed and Flat  Cognitive:  Alert and Oriented  Insight:  Developing/Improving  Engagement in Group:  Improving  Modes of Intervention:  Clarification, Exploration, Problem-solving and Support  Summary of Progress/Problems:  Pt denies both suicidal and homicidal ideation.  On a scale of 1 to 10 with ten being the most ever experienced, the patient rates depression at a 8 and anxiety at a 5. Patient reports her depression is usually high, average of 4.  Patient is hopeful to make contact with [facility] today or [facility].    [name] 12/26/2012, 3:57 PM

## 2012-12-26 NOTE — Progress Notes (Signed)
[facility] Adult Case Management Discharge Plan :  Will you be returning to the same living situation after discharge: No. Will be staying with sober friend until Monday 3/24 admit to [facility] At discharge, do you have transportation home?:Yes,  friend will pick up Do you have the ability to pay for your medications:Yes,  through [facility] or free clinic  Release of information consent forms completed and in the chart;  Patient's signature needed at discharge.  Patient to Follow up at: Follow-up Information   Follow up with [facility] On 12/29/2012. (Admit to [facility] on [facility] 12/29/12 at time you and director agreed upon)    Contact information:   [address] [place] [address] Ph [phone] FAX [phone]      Follow up with [facility] . (Go to [facility] in [place] for followup either Wednesday 3/26 or Friday 3/28 )    Contact information:   [address] [place], [place]  [address] [facility] [phone] Valinda Hoar [phone]      Patient denies SI/HI:   Yes,  denies both    Safety Planning and Suicide Prevention discussed:  Yes,  with patient.   [name] 12/26/2012, 3:52 PM

## 2012-12-26 NOTE — Progress Notes (Signed)
[facility] Group Notes:  (Nursing/MHT/Case Management/Adjunct)  Date:  12/26/2012  Time:  2:55 PM  Type of Therapy:  Therapeutic Activity   Participation Level:  Active  Participation Quality:  Appropriate, Attentive and Sharing  Affect:  Appropriate  Cognitive:  Appropriate  Insight:  Appropriate and Good  Engagement in Group:  Engaged  Modes of Intervention:  Activity and Socialization  Summary of Progress/Problems: Pts were asked to participate in a game of Pictionary.Discussion was had at end of group about the importance of activities such as this in life and the benefits. Pts participation increased throughout group.     Dalia Heading 12/26/2012, 2:55 PM

## 2012-12-26 NOTE — Progress Notes (Signed)
Patient ID: Jacqueline [name], female   DOB: 1966-03-31, 47 y.o.   MRN: [id number] Patient discharged per physician order; patient denies SI/HI and A/V hallucinations; patient given samples; prescriptions; and copy of AVS after it was reviewed; patient had no other questions or concerns at this time; patient left the unit ambulatory after verbalizing and signing she received all belongings

## 2012-12-26 NOTE — Progress Notes (Signed)
Pt. Is resting quietly/ No signs of distress or discomfort noted at this time. 
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
## 2012-12-26 NOTE — Progress Notes (Signed)
[facility] INPATIENT:  Family/Significant Other Suicide Prevention Education  Suicide Prevention Education:  Patient Refusal for Family/Significant Other Suicide Prevention Education: The patient Jacqueline [name] has refused to provide written consent for family/significant other to be provided Family/Significant Other Suicide Prevention Education during admission and/or prior to discharge.  Physician notified. Writer provided suicide prevention education directly to patient; conversation included risk factors, warning signs and resources to contact for help. Mobile crisis services explained and contact card placed in chart for pt to receive at discharge.  [name] 12/26/2012, 3:53 PM

## 2012-12-26 NOTE — Progress Notes (Signed)
Patient ID: Jacqueline [name], female   DOB: December 24, 1965, 47 y.o.   MRN: [id number] [facility] MD Progress Note  12/26/2012 11:16 AM Jacqueline [name]  MRN:  [id number]  Subjective:  Jacqueline [name] endorses that she is feeling increasingly more sedated on both Risperdal and Remeron. States that she needs to stay focused, engaged and not feel zonked out. She wants Adderall to help her combat the feeling of drowsiness, inability to focus and or concentrate. She says that she has been on Adderall in the past and still remembers how helpful it was to her. Jacqueline [name] remains apprehensive about where to go from here. She states that she has a safe place to go to after discharged from here, however, would like to know if she will have a bed at the half way house to go to by Monday next week.  This safe place that she will be going to is owned by a friend, who is another recovering addict, but it will not be a permanent place to stay. Therefore she will need to go to the half way house after all.    Diagnosis:  Major Depression recurrent, Alcohol Dependence/withdrawal Cocaine Abuse, GAD  ADL's:  Intact  Sleep: Fair  Appetite:  Poor  Suicidal Ideation:  Plan:  ideas, no plans Intent:  denies Means:  denies Homicidal Ideation:  Plan:  denies Intent:  denies Means:  denies  AEB (as evidenced by):  Psychiatric Specialty Exam: Review of Systems  Constitutional: Positive for malaise/fatigue.  HENT: Negative.   Eyes: Negative.   Respiratory: Negative.   Cardiovascular: Negative.   Gastrointestinal: Negative.   Genitourinary: Negative.   Musculoskeletal: Negative.   Skin: Negative.   Neurological: Positive for weakness.  Endo/Heme/Allergies: Negative.   Psychiatric/Behavioral: Positive for depression and substance abuse. The patient is nervous/anxious and has insomnia.     Blood pressure 122/81, pulse 85, temperature 99.4 F (37.4 C), temperature source Oral, resp. rate 24, height 5\' 9"  (1.753 m), weight  81.647 kg (180 lb), last menstrual period 12/17/2012.Body mass index is 26.57 kg/(m^2).  General Appearance: Fairly Groomed  Patent attorney::  Fair  Speech:  Clear and Coherent, Slow and not spontaneous  Volume:  Decreased  Mood:  Anxious and Depressed  Affect:  Depressed and Tearful  Thought Process:  Coherent and Goal Directed  Orientation:  Full (Time, Place, and Person)  Thought Content:  worries, concerns  Suicidal Thoughts:  Yes.  without intent/plan, intermittent when she thinks about her situation, the lack of support  Homicidal Thoughts:  No  Memory:  Immediate;   Fair Recent;   Fair Remote;   Fair  Judgement:  Fair  Insight:  Present  Psychomotor Activity:  Drowsy  Concentration:  Fair  Recall:  Fair  Akathisia:  No  Handed:  Right  AIMS (if indicated):     Assets:  Desire for Improvement  Sleep:  Number of Hours: 6.25   Current Medications: Current Facility-Administered Medications  Medication Dose Route Frequency Provider Last Rate Last Dose  . acamprosate (CAMPRAL) tablet 666 mg  666 mg Oral TID WC Rachael Fee, MD   666 mg at 12/26/12 [phone]  . acetaminophen (TYLENOL) tablet 650 mg  650 mg Oral Q6H PRN Sanjuana Kava, NP   650 mg at 12/26/12 0646  . alum & mag hydroxide-simeth (MAALOX/MYLANTA) 200-200-20 MG/5ML suspension 30 mL  30 mL Oral Q4H PRN Sanjuana Kava, NP   30 mL at 12/25/12 1406  . diphenhydrAMINE (BENADRYL) capsule 50 mg  50 mg Oral QHS PRN,MR X 1 Kerry Hough, PA-C   50 mg at 12/23/12 2139  . hydrOXYzine (ATARAX/VISTARIL) tablet 25 mg  25 mg Oral Q6H PRN Rachael Fee, MD   25 mg at 12/26/12 [phone]  . levETIRAcetam (KEPPRA XR) 24 hr tablet 500 mg  500 mg Oral Q12H Sanjuana Kava, NP   500 mg at 12/26/12 0800  . levothyroxine (SYNTHROID, LEVOTHROID) tablet 112 mcg  112 mcg Oral QAC breakfast Sanjuana Kava, NP   112 mcg at 12/26/12 0646  . magnesium hydroxide (MILK OF MAGNESIA) suspension 30 mL  30 mL Oral Daily PRN Sanjuana Kava, NP      . mirtazapine  (REMERON) tablet 15 mg  15 mg Oral QHS Rachael Fee, MD   15 mg at 12/25/12 2219  . multivitamin with minerals tablet 1 tablet  1 tablet Oral Daily Sanjuana Kava, NP   1 tablet at 12/23/12 0757  . nicotine (NICODERM CQ - dosed in mg/24 hours) patch 21 mg  21 mg Transdermal Daily Rachael Fee, MD   21 mg at 12/26/12 0801  . pantoprazole (PROTONIX) EC tablet 40 mg  40 mg Oral Daily Rachael Fee, MD   40 mg at 12/26/12 0801  . risperiDONE (RISPERDAL) tablet 0.5 mg  0.5 mg Oral BID Rachael Fee, MD   0.5 mg at 12/26/12 0801  . sertraline (ZOLOFT) tablet 100 mg  100 mg Oral Daily Rachael Fee, MD   100 mg at 12/26/12 0800  . thiamine (B-1) injection 100 mg  100 mg Intramuscular Once Sanjuana Kava, NP      . thiamine (VITAMIN B-1) tablet 100 mg  100 mg Oral Daily Sanjuana Kava, NP   100 mg at 12/26/12 0800    Lab Results: No results found for this or any previous visit (from the past 48 hour(s)).  Physical Findings: AIMS: Facial and Oral Movements Muscles of Facial Expression: None, normal Lips and Perioral Area: None, normal Jaw: None, normal Tongue: None, normal,Extremity Movements Upper (arms, wrists, hands, fingers): None, normal Lower (legs, knees, ankles, toes): None, normal, Trunk Movements Neck, shoulders, hips: None, normal,  , Dental Status Current problems with teeth and/or dentures?: No Does patient usually wear dentures?: No  CIWA:  CIWA-Ar Total: 8 COWS:     Treatment Plan Summary: Daily contact with patient to assess and evaluate symptoms and progress in treatment Medication management Resume Vistaril 25 mg every 6 hrs as needed Increase the Risperdal to 0.5 mg BID   Plan: Supportive approach/coping skills/relapse prevention. Continue Risperdal 0.5mg  bid as ordered, give time for adjustment. Facilitate placement. Continue current treatment plan.  Medical Decision Making Problem Points:  Review of psycho-social stressors (1) Data Points:  Review of medication  regiment & side effects (2) Review of new medications or change in dosage (2)  I certify that inpatient services furnished can reasonably be expected to improve the patient's condition.   Armandina Stammer I 12/26/2012, 11:16 AM

## 2012-12-26 NOTE — [facility] Suicide Risk Assessment (Signed)
Suicide Risk Assessment  Discharge Assessment     Demographic Factors:  Caucasian  Mental Status Per Nursing Assessment::   On Admission:     Current Mental Status by Physician: In full contact with reality. Her mood is euthymic. Her affect is appropriate. She denies suicidal ideas, plans or intent. She is wanting to leave today. She has secured a bed at [facility]. She is relieved that she has a safe place to go. She states that she is committed to abstinence.    Loss Factors: NA  Historical Factors: NA  Risk Reduction Factors:   Sense of responsibility to family  Continued Clinical Symptoms:  Depression:   Comorbid alcohol abuse/dependence Alcohol/Substance Abuse/Dependencies  Cognitive Features That Contribute To Risk: none identified   Suicide Risk:  Minimal: No identifiable suicidal ideation.  Patients presenting with no risk factors but with morbid ruminations; may be classified as minimal risk based on the severity of the depressive symptoms  Discharge Diagnoses:   AXIS I:  Alcohol Dependence/withdrawal. Cocaine Abuse, GAD, Depressive disorder NOS AXIS II:  Deferred AXIS III:   Past Medical History  Diagnosis Date  . Peptic ulcer   . Alcohol abuse   . Anemia   . Benzodiazepine abuse   . Depression   . Anxiety   . Thyroid disease   . Alcoholism   . Narcotic abuse   . Back pain   . Thrombocytopenia 06/17/2011  . Hypothyroidism    AXIS IV:  housing problems, other psychosocial or environmental problems and problems with primary support group AXIS V:  61-70 mild symptoms  Plan Of Care/Follow-up recommendations:  Activity:  as tolerated Diet:  regular Will follow up outpatient basis Is patient on multiple antipsychotic therapies at discharge:  No   Has Patient had three or more failed trials of antipsychotic monotherapy by history:  No  Recommended Plan for Multiple Antipsychotic Therapies: N/A   Leovardo Thoman A 12/26/2012, 3:28 PM

## 2012-12-31 NOTE — Progress Notes (Signed)
Patient Discharge Instructions:  After Visit Summary (AVS):   Faxed to:  12/31/12 Discharge Summary Note:   Faxed to:  12/31/12 Psychiatric Admission Assessment Note:   Faxed to:  12/31/12 Suicide Risk Assessment - Discharge Assessment:   Faxed to:  12/31/12 Faxed/Sent to the Next Level Care provider:  12/31/12 Faxed to [facility] @ [phone] Faxed to [facility] @ [phone]  Jerelene Redden, 12/31/2012, 3:06 PM

## 2013-02-28 ENCOUNTER — Emergency Department (HOSPITAL_COMMUNITY)
Admission: EM | Admit: 2013-02-28 | Discharge: 2013-03-01 | Disposition: A | Discharge status: Home / Self Care | Payer: Self-pay | Attending: *Deleted | Admitting: *Deleted

## 2013-02-28 ENCOUNTER — Encounter (HOSPITAL_COMMUNITY): Payer: Self-pay | Admitting: *Deleted

## 2013-02-28 DIAGNOSIS — F1110 Opioid abuse, uncomplicated: Secondary | ICD-10-CM | POA: Insufficient documentation

## 2013-02-28 DIAGNOSIS — F3289 Other specified depressive episodes: Secondary | ICD-10-CM | POA: Insufficient documentation

## 2013-02-28 DIAGNOSIS — Z8719 Personal history of other diseases of the digestive system: Secondary | ICD-10-CM | POA: Insufficient documentation

## 2013-02-28 DIAGNOSIS — D649 Anemia, unspecified: Secondary | ICD-10-CM | POA: Insufficient documentation

## 2013-02-28 DIAGNOSIS — Z79899 Other long term (current) drug therapy: Secondary | ICD-10-CM | POA: Insufficient documentation

## 2013-02-28 DIAGNOSIS — G40909 Epilepsy, unspecified, not intractable, without status epilepticus: Secondary | ICD-10-CM | POA: Insufficient documentation

## 2013-02-28 DIAGNOSIS — E876 Hypokalemia: Secondary | ICD-10-CM | POA: Insufficient documentation

## 2013-02-28 DIAGNOSIS — F1410 Cocaine abuse, uncomplicated: Secondary | ICD-10-CM | POA: Insufficient documentation

## 2013-02-28 DIAGNOSIS — F1010 Alcohol abuse, uncomplicated: Secondary | ICD-10-CM

## 2013-02-28 DIAGNOSIS — F411 Generalized anxiety disorder: Secondary | ICD-10-CM | POA: Insufficient documentation

## 2013-02-28 DIAGNOSIS — R1110 Vomiting, unspecified: Secondary | ICD-10-CM | POA: Insufficient documentation

## 2013-02-28 DIAGNOSIS — Z9884 Bariatric surgery status: Secondary | ICD-10-CM | POA: Insufficient documentation

## 2013-02-28 DIAGNOSIS — F17200 Nicotine dependence, unspecified, uncomplicated: Secondary | ICD-10-CM | POA: Insufficient documentation

## 2013-02-28 DIAGNOSIS — F10229 Alcohol dependence with intoxication, unspecified: Secondary | ICD-10-CM | POA: Insufficient documentation

## 2013-02-28 DIAGNOSIS — Z862 Personal history of diseases of the blood and blood-forming organs and certain disorders involving the immune mechanism: Secondary | ICD-10-CM | POA: Insufficient documentation

## 2013-02-28 DIAGNOSIS — F329 Major depressive disorder, single episode, unspecified: Secondary | ICD-10-CM | POA: Insufficient documentation

## 2013-02-28 DIAGNOSIS — E039 Hypothyroidism, unspecified: Secondary | ICD-10-CM | POA: Insufficient documentation

## 2013-02-28 LAB — CBC WITH DIFFERENTIAL/PLATELET
Basophils Absolute: 0 10*3/uL (ref 0.0–0.1)
Eosinophils Absolute: 0.2 10*3/uL (ref 0.0–0.7)
Eosinophils Relative: 3 % (ref 0–5)
HCT: 32 % — ABNORMAL LOW (ref 36.0–46.0)
Hemoglobin: 10 g/dL — ABNORMAL LOW (ref 12.0–15.0)
MCHC: 31.3 g/dL (ref 30.0–36.0)
Neutro Abs: 4.3 10*3/uL (ref 1.7–7.7)
Neutrophils Relative %: 63 % (ref 43–77)
RBC: 3.77 MIL/uL — ABNORMAL LOW (ref 3.87–5.11)

## 2013-02-28 LAB — COMPREHENSIVE METABOLIC PANEL
ALT: 11 U/L (ref 0–35)
AST: 18 U/L (ref 0–37)
Calcium: 8.7 mg/dL (ref 8.4–10.5)
Sodium: 137 mEq/L (ref 135–145)
Total Protein: 7.4 g/dL (ref 6.0–8.3)

## 2013-02-28 MED ORDER — ONDANSETRON HCL 4 MG/2ML IJ SOLN
4.0000 mg | Freq: Once | INTRAMUSCULAR | Status: AC
  Administered 2013-02-28: 4 mg via INTRAVENOUS
  Filled 2013-02-28: qty 2

## 2013-02-28 MED ORDER — SODIUM CHLORIDE 0.9 % IV SOLN
1000.0000 mL | INTRAVENOUS | Status: DC
  Administered 2013-03-01: 1000 mL via INTRAVENOUS

## 2013-02-28 MED ORDER — SODIUM CHLORIDE 0.9 % IV SOLN
1000.0000 mL | Freq: Once | INTRAVENOUS | Status: AC
  Administered 2013-02-28: 1000 mL via INTRAVENOUS

## 2013-02-28 NOTE — ED Provider Notes (Addendum)
History  This chart was scribed for [name], MD by Bennett Scrape, ED Scribe. This patient was seen in room APA16A/APA16A and the patient's care was started at 9:24 PM.  CSN: [id number]  Arrival date & time 02/28/13  2029   First MD Initiated Contact with Patient 02/28/13 2124      Chief Complaint  Patient presents with  . V70.1   Level 5 Caveat- AMS due to alcohol intoxication.  The history is provided by the patient. No language interpreter was used.    HPI Comments: Jacqueline [name] is a 47 y.o. female who presents to the Emergency Department for alcohol detox after being found on the side of the road by police. She reports prior detox attempts, last one was December 2013 at [facility]. She states that she stayed sober for one month after the last discharge. She reports that she is drinking anything she can get her hands on including one bottle of listerine a day and one case of beer a day. She denies liquor use.  She reports having a h/o withdrawal seizures but denies having a seizure condition otherwise. She reports that she used cocaine yesterday but denies that this is a daily habit for her. She reports emesis today but is uncooperative with other ROS questioning. She denies SI or HI. She is currently on zoloft, remeron and prilosec. Pt is a current everyday smoker.   PCP  Past Medical History  Diagnosis Date  . Peptic ulcer   . Alcohol abuse   . Anemia   . Benzodiazepine abuse   . Depression   . Anxiety   . Thyroid disease   . Alcoholism   . Narcotic abuse   . Back pain   . Thrombocytopenia 06/17/2011  . Hypothyroidism     Past Surgical History  Procedure Laterality Date  . Cholecystectomy    . Abdominal surgery    . Esophagogastroduodenoscopy    . Gastric bypass    . Tubal ligation      Family History  Problem Relation Age of Onset  . Alcohol abuse Father   . Alcoholism Father   . Cancer Other     History  Substance Use Topics  . Smoking status:  Current Every Day Smoker -- 1.00 packs/day for 12 years    Types: Cigarettes    Last Attempt to Quit: 08/08/2011  . Smokeless tobacco: Never Used  . Alcohol Use: Yes     Comment: drink 4 liters a day. 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 acamprosate (CAMPRAL) 333 MG tablet   Oral   Take 2 tablets (666 mg total) by mouth 3 (three) times daily with meals. For alcoholism   90 tablet NOT TAKING  0   . levETIRAcetam (KEPPRA XR) 500 MG 24 hr tablet   Oral   Take 1 tablet (500 mg total) by mouth every 12 (twelve) hours. For mood stability/seizures   60 tablet NOT TAKING  0   . levothyroxine (SYNTHROID, LEVOTHROID) 112 MCG tablet  Oral   Take 1 tablet (112 mcg total) by mouth daily. For hypothyroidism   30 tablet   1   . mirtazapine (REMERON) 15 MG tablet   Oral   Take 1 tablet (15 mg total) by mouth at bedtime. For depression/sleep   30 tablet NOT TAKING  0   . nicotine (NICODERM CQ - DOSED IN MG/24 HOURS) 21 mg/24hr patch   Transdermal   Place 1 patch onto the skin daily. For nicotine addiction   28 patch   0   . omeprazole (PRILOSEC) 20 MG capsule   Oral   Take 2 capsules (40 mg total) by mouth daily. For acid reflux         . risperiDONE (RISPERDAL) 0.5 MG tablet   Oral   Take 1 tablet (0.5 mg total) by mouth 2 (two) times daily. For anxiety/mood control   60 tablet NOT TAKING  0   . sertraline (ZOLOFT) 100 MG tablet   Oral   Take 1 tablet (100 mg total) by mouth daily. For depression   30 tablet   0     Triage Vitals: BP 95/53  Pulse 101  Temp(Src) 98.6 F (37 C) (Oral)  Resp 20  SpO2 96%  Vital signs normal except  tachycardia and mild hypotension   Physical Exam  Nursing note and vitals reviewed. Constitutional: She is oriented to person, place, and time. She appears well-developed and well-nourished.  Non-toxic appearance. She does not appear ill. No distress.  Appears intoxicated and is having dry heaves  HENT:  Head: Normocephalic and atraumatic.  Right Ear: External ear normal.  Left Ear: External ear normal.  Nose: Nose normal. No mucosal edema or rhinorrhea.  Mouth/Throat: Oropharynx is clear and moist and mucous membranes are normal. No dental abscesses or edematous.  Dry MM  Eyes: Conjunctivae and EOM are normal. Pupils are equal, round, and reactive to light.  Neck: Normal range of motion and full passive range of motion without pain. Neck supple.  Cardiovascular: Normal rate, regular rhythm and normal heart sounds.  Exam reveals no gallop and no friction rub.   No murmur heard. Pulmonary/Chest: Effort normal and breath sounds normal. No respiratory distress. She has no wheezes. She has no rhonchi. She has no rales. She exhibits no tenderness and no crepitus.  Abdominal: Soft. Normal appearance and bowel sounds are normal. She exhibits no distension. There is no tenderness. There is no rebound and no guarding.  Musculoskeletal: Normal range of motion. She exhibits no edema and no tenderness.  Moves all extremities well.   Neurological: She is alert and oriented to person, place, and time. She has normal strength. No cranial nerve deficit.  Skin: Skin is warm, dry and intact. No rash noted. No erythema. No pallor.  Psychiatric: She has a normal mood and affect. Her speech is normal and behavior is normal. Her mood appears not anxious.    ED Course  Procedures (including critical care time)  Medications  0.9 %  sodium chloride infusion (1,000 mLs Intravenous New Bag/Given 02/28/13 2315)    Followed by  0.9 %  sodium chloride infusion (not administered)  levothyroxine (SYNTHROID,  LEVOTHROID) tablet 112 mcg (not administered)  pantoprazole (PROTONIX) EC tablet 40 mg (not administered)  sertraline (ZOLOFT) tablet 100 mg (not administered)  LORazepam (ATIVAN) tablet 1 mg (not administered)  acetaminophen (TYLENOL) tablet 650 mg (not administered)  zolpidem (AMBIEN) tablet 10 mg (not administered)  nicotine (NICODERM CQ - dosed in mg/24 hours) patch 21  mg (not administered)  ondansetron (ZOFRAN) tablet 4 mg (not administered)  alum & mag hydroxide-simeth (MAALOX/MYLANTA) 200-200-20 MG/5ML suspension 30 mL (not administered)  potassium chloride 10 mEq in 100 mL IVPB (not administered)  potassium chloride SA (K-DUR,KLOR-CON) CR tablet 40 mEq (not administered)  ondansetron (ZOFRAN) injection 4 mg (4 mg Intravenous Given 02/28/13 2316)    DIAGNOSTIC STUDIES: Oxygen Saturation is 96% on room air, normal by my interpretation.    COORDINATION OF CARE: 9:44 PM-Discussed treatment plan which includes medications, CBC panel, CMP and UA with pt at bedside and pt agreed to plan.   Results for orders placed during the hospital encounter of 02/28/13  CBC WITH DIFFERENTIAL      Result Value Range   WBC 6.8  4.0 - 10.5 K/uL   RBC 3.77 (*) 3.87 - 5.11 MIL/uL   Hemoglobin 10.0 (*) 12.0 - 15.0 g/dL   HCT 16.1 (*) 09.6 - 04.5 %   MCV 84.9  78.0 - 100.0 fL   MCH 26.5  26.0 - 34.0 pg   MCHC 31.3  30.0 - 36.0 g/dL   RDW 40.9 (*) 81.1 - 91.4 %   Platelets 374  150 - 400 K/uL   Neutrophils Relative % 63  43 - 77 %   Neutro Abs 4.3  1.7 - 7.7 K/uL   Lymphocytes Relative 24  12 - 46 %   Lymphs Abs 1.6  0.7 - 4.0 K/uL   Monocytes Relative 9  3 - 12 %   Monocytes Absolute 0.6  0.1 - 1.0 K/uL   Eosinophils Relative 3  0 - 5 %   Eosinophils Absolute 0.2  0.0 - 0.7 K/uL   Basophils Relative 1  0 - 1 %   Basophils Absolute 0.0  0.0 - 0.1 K/uL  COMPREHENSIVE METABOLIC PANEL      Result Value Range   Sodium 137  135 - 145 mEq/L   Potassium 3.3 (*) 3.5 - 5.1 mEq/L   Chloride 100  96 -  112 mEq/L   CO2 21  19 - 32 mEq/L   Glucose, Bld 88  70 - 99 mg/dL   BUN 10  6 - 23 mg/dL   Creatinine, Ser 7.82  0.50 - 1.10 mg/dL   Calcium 8.7  8.4 - 95.6 mg/dL   Total Protein 7.4  6.0 - 8.3 g/dL   Albumin 4.1  3.5 - 5.2 g/dL   AST 18  0 - 37 U/L   ALT 11  0 - 35 U/L   Alkaline Phosphatase 73  39 - 117 U/L   Total Bilirubin 0.1 (*) 0.3 - 1.2 mg/dL   GFR calc non Af Amer 68 (*) >90 mL/min   GFR calc Af Amer 79 (*) >90 mL/min  ETHANOL      Result Value Range   Alcohol, Ethyl (B) 240 (*) 0 - 11 mg/dL  URINALYSIS, ROUTINE W REFLEX MICROSCOPIC      Result Value Range   Color, Urine YELLOW  YELLOW   APPearance CLEAR  CLEAR   Specific Gravity, Urine <1.005 (*) 1.005 - 1.030   pH 5.5  5.0 - 8.0   Glucose, UA NEGATIVE  NEGATIVE mg/dL   Hgb urine dipstick NEGATIVE  NEGATIVE   Bilirubin Urine NEGATIVE  NEGATIVE   Ketones, ur NEGATIVE  NEGATIVE mg/dL   Protein, ur NEGATIVE  NEGATIVE mg/dL   Urobilinogen, UA 0.2  0.0 - 1.0 mg/dL   Nitrite NEGATIVE  NEGATIVE   Leukocytes, UA TRACE (*) NEGATIVE  URINE RAPID DRUG SCREEN (HOSP PERFORMED)      Result Value Range   Opiates NONE DETECTED  NONE DETECTED   Cocaine POSITIVE (*) NONE DETECTED   Benzodiazepines NONE DETECTED  NONE DETECTED   Amphetamines NONE DETECTED  NONE DETECTED   Tetrahydrocannabinol NONE DETECTED  NONE DETECTED   Barbiturates NONE DETECTED  NONE DETECTED  URINE MICROSCOPIC-ADD ON      Result Value Range   Squamous Epithelial / LPF RARE  RARE   WBC, UA 0-2  <3 WBC/hpf   RBC / HPF 0-2  <3 RBC/hpf   Bacteria, UA RARE  RARE   Laboratory interpretation all normal except alcohol intoxication, mild hypokalemia, anemia, +cocaine       1. Alcohol abuse   2. Hypokalemia with normal acid-base balance   3. Anemia   4. Cocaine abuse     Disposition--pt to be reassessed in the am once she is sober to see if she still desires detox. She can leave with a sober adult if she wants because she has no SI or HI.    Devoria Albe, MD, FACEP   MDM    I personally performed the services described in this documentation, which was scribed in my presence. The recorded information has been reviewed and considered.  Devoria Albe, MD, FACEP    [name], MD 03/01/13 7829  [name], MD 03/01/13 [phone]

## 2013-02-28 NOTE — ED Notes (Signed)
Pt rather non cooperative states "I'm a drunk and I need detoxed."  Pt denies any other concerns.  Denies HI/SI.  Pt was found on side of road.

## 2013-02-28 NOTE — ED Notes (Signed)
Pt wanded by security and placed in paper scrubs. 

## 2013-02-28 NOTE — ED Notes (Signed)
Two RN's have attempted twice to place IV with no success.

## 2013-02-28 NOTE — ED Notes (Signed)
Pt is non-cooperative. Will not open eyes to respond to staff. Lying on bed and will give only one word responses at this time.

## 2013-03-01 LAB — RAPID URINE DRUG SCREEN, HOSP PERFORMED
Amphetamines: NOT DETECTED
Cocaine: POSITIVE — AB
Opiates: NOT DETECTED
Tetrahydrocannabinol: NOT DETECTED

## 2013-03-01 LAB — URINALYSIS, ROUTINE W REFLEX MICROSCOPIC
Glucose, UA: NEGATIVE mg/dL
Hgb urine dipstick: NEGATIVE
Specific Gravity, Urine: <1.005 — ABNORMAL LOW (ref 1.005–1.030)
Urobilinogen, UA: 0.2 mg/dL (ref 0.0–1.0)

## 2013-03-01 LAB — URINE MICROSCOPIC-ADD ON

## 2013-03-01 MED ORDER — NICOTINE 21 MG/24HR TD PT24: 21.0000 mg | MEDICATED_PATCH | Freq: Every day | TRANSDERMAL | Status: DC

## 2013-03-01 MED ORDER — POTASSIUM CHLORIDE 10 MEQ/100ML IV SOLN
10.0000 meq | INTRAVENOUS | Status: AC
  Administered 2013-03-01 (×3): 10 meq via INTRAVENOUS
  Filled 2013-03-01 (×3): qty 100

## 2013-03-01 MED ORDER — SODIUM CHLORIDE 0.9 % IV BOLUS (SEPSIS)
1000.0000 mL | Freq: Once | INTRAVENOUS | Status: AC
  Administered 2013-03-01: 1000 mL via INTRAVENOUS

## 2013-03-01 MED ORDER — ACETAMINOPHEN 325 MG PO TABS: 650.0000 mg | ORAL_TABLET | ORAL | Status: DC | PRN

## 2013-03-01 MED ORDER — ONDANSETRON HCL 4 MG PO TABS: 4.0000 mg | ORAL_TABLET | Freq: Three times a day (TID) | ORAL | Status: DC | PRN

## 2013-03-01 MED ORDER — SERTRALINE HCL 100 MG PO TABS
100.0000 mg | ORAL_TABLET | Freq: Every day | ORAL | Status: DC
  Filled 2013-03-01 (×2): qty 1

## 2013-03-01 MED ORDER — LEVOTHYROXINE SODIUM 112 MCG PO TABS
112.0000 ug | ORAL_TABLET | Freq: Every day | ORAL | Status: DC
  Filled 2013-03-01 (×2): qty 1

## 2013-03-01 MED ORDER — ZOLPIDEM TARTRATE 5 MG PO TABS: 10.0000 mg | ORAL_TABLET | Freq: Every evening | ORAL | Status: DC | PRN

## 2013-03-01 MED ORDER — POTASSIUM CHLORIDE CRYS ER 20 MEQ PO TBCR
40.0000 meq | EXTENDED_RELEASE_TABLET | Freq: Once | ORAL | Status: DC
  Filled 2013-03-01: qty 2

## 2013-03-01 MED ORDER — ALUM & MAG HYDROXIDE-SIMETH 200-200-20 MG/5ML PO SUSP: 30.0000 mL | ORAL | Status: DC | PRN

## 2013-03-01 MED ORDER — LORAZEPAM 1 MG PO TABS
1.0000 mg | ORAL_TABLET | Freq: Three times a day (TID) | ORAL | Status: DC | PRN
  Administered 2013-03-01: 1 mg via ORAL
  Filled 2013-03-01: qty 1

## 2013-03-01 MED ORDER — PANTOPRAZOLE SODIUM 40 MG PO TBEC: 40.0000 mg | DELAYED_RELEASE_TABLET | Freq: Every day | ORAL | Status: DC

## 2013-03-01 NOTE — ED Provider Notes (Signed)
Pt stable, ambulatory, no distress and appears clinically sober I advised f/u as outpatient for further assistance for polysubstance abuse She has no signs of significant withdrawal at this time   Joya Gaskins, MD 03/01/13 [phone]

## 2013-03-20 ENCOUNTER — Encounter (HOSPITAL_COMMUNITY): Payer: Self-pay | Admitting: Emergency Medicine

## 2013-03-20 ENCOUNTER — Other Ambulatory Visit: Payer: Self-pay

## 2013-03-20 ENCOUNTER — Inpatient Hospital Stay (HOSPITAL_COMMUNITY)
Admission: EM | Admit: 2013-03-20 | Discharge: 2013-03-24 | DRG: 378 | Disposition: A | Discharge status: Home / Self Care | Payer: MEDICAID | Attending: Internal Medicine | Admitting: Internal Medicine

## 2013-03-20 DIAGNOSIS — K279 Peptic ulcer, site unspecified, unspecified as acute or chronic, without hemorrhage or perforation: Secondary | ICD-10-CM | POA: Diagnosis present

## 2013-03-20 DIAGNOSIS — F10220 Alcohol dependence with intoxication, uncomplicated: Secondary | ICD-10-CM

## 2013-03-20 DIAGNOSIS — D649 Anemia, unspecified: Secondary | ICD-10-CM | POA: Diagnosis present

## 2013-03-20 DIAGNOSIS — F419 Anxiety disorder, unspecified: Secondary | ICD-10-CM | POA: Diagnosis present

## 2013-03-20 DIAGNOSIS — K921 Melena: Secondary | ICD-10-CM

## 2013-03-20 DIAGNOSIS — K922 Gastrointestinal hemorrhage, unspecified: Secondary | ICD-10-CM

## 2013-03-20 DIAGNOSIS — F10229 Alcohol dependence with intoxication, unspecified: Secondary | ICD-10-CM | POA: Diagnosis present

## 2013-03-20 DIAGNOSIS — E039 Hypothyroidism, unspecified: Secondary | ICD-10-CM | POA: Diagnosis present

## 2013-03-20 DIAGNOSIS — F411 Generalized anxiety disorder: Secondary | ICD-10-CM | POA: Diagnosis present

## 2013-03-20 DIAGNOSIS — K289 Gastrojejunal ulcer, unspecified as acute or chronic, without hemorrhage or perforation: Secondary | ICD-10-CM | POA: Diagnosis present

## 2013-03-20 DIAGNOSIS — D500 Iron deficiency anemia secondary to blood loss (chronic): Secondary | ICD-10-CM | POA: Diagnosis present

## 2013-03-20 DIAGNOSIS — F10239 Alcohol dependence with withdrawal, unspecified: Secondary | ICD-10-CM | POA: Diagnosis present

## 2013-03-20 DIAGNOSIS — F1110 Opioid abuse, uncomplicated: Secondary | ICD-10-CM | POA: Diagnosis present

## 2013-03-20 DIAGNOSIS — K449 Diaphragmatic hernia without obstruction or gangrene: Secondary | ICD-10-CM | POA: Diagnosis present

## 2013-03-20 DIAGNOSIS — F1410 Cocaine abuse, uncomplicated: Secondary | ICD-10-CM | POA: Diagnosis present

## 2013-03-20 DIAGNOSIS — K283 Acute gastrojejunal ulcer without hemorrhage or perforation: Secondary | ICD-10-CM

## 2013-03-20 DIAGNOSIS — Z9884 Bariatric surgery status: Secondary | ICD-10-CM

## 2013-03-20 DIAGNOSIS — F10939 Alcohol use, unspecified with withdrawal, unspecified: Secondary | ICD-10-CM | POA: Diagnosis present

## 2013-03-20 DIAGNOSIS — K2921 Alcoholic gastritis with bleeding: Principal | ICD-10-CM | POA: Diagnosis present

## 2013-03-20 HISTORY — DX: Unspecified convulsions: R56.9

## 2013-03-20 LAB — URINALYSIS, ROUTINE W REFLEX MICROSCOPIC
Glucose, UA: NEGATIVE mg/dL
Ketones, ur: NEGATIVE mg/dL
Leukocytes, UA: NEGATIVE
Nitrite: NEGATIVE
Protein, ur: NEGATIVE mg/dL
pH: 5 (ref 5.0–8.0)

## 2013-03-20 LAB — RAPID URINE DRUG SCREEN, HOSP PERFORMED
Amphetamines: NOT DETECTED
Benzodiazepines: NOT DETECTED
Cocaine: POSITIVE — AB
Opiates: NOT DETECTED

## 2013-03-20 LAB — PREGNANCY, URINE: Preg Test, Ur: NEGATIVE

## 2013-03-20 LAB — CBC
HCT: 30.4 % — ABNORMAL LOW (ref 36.0–46.0)
MCH: 26 pg (ref 26.0–34.0)
MCHC: 31.9 g/dL (ref 30.0–36.0)
MCV: 81.5 fL (ref 78.0–100.0)
Platelets: 499 10*3/uL — ABNORMAL HIGH (ref 150–400)
RDW: 15.7 % — ABNORMAL HIGH (ref 11.5–15.5)

## 2013-03-20 LAB — ETHANOL: Alcohol, Ethyl (B): 270 mg/dL — ABNORMAL HIGH (ref 0–11)

## 2013-03-20 LAB — COMPREHENSIVE METABOLIC PANEL
AST: 21 U/L (ref 0–37)
Albumin: 4 g/dL (ref 3.5–5.2)
BUN: 6 mg/dL (ref 6–23)
Calcium: 8.4 mg/dL (ref 8.4–10.5)
Chloride: 98 mEq/L (ref 96–112)
Creatinine, Ser: 0.82 mg/dL (ref 0.50–1.10)
Total Bilirubin: 0.2 mg/dL — ABNORMAL LOW (ref 0.3–1.2)

## 2013-03-20 LAB — ACETAMINOPHEN LEVEL: Acetaminophen (Tylenol), Serum: <15 ug/mL (ref 10–30)

## 2013-03-20 MED ORDER — LORAZEPAM 1 MG PO TABS
0.0000 mg | ORAL_TABLET | Freq: Four times a day (QID) | ORAL | Status: AC
  Administered 2013-03-20: 2 mg via ORAL
  Administered 2013-03-20: 1 mg via ORAL
  Administered 2013-03-21: 2 mg via ORAL
  Filled 2013-03-20: qty 1
  Filled 2013-03-20 (×3): qty 2

## 2013-03-20 MED ORDER — CLONAZEPAM 1 MG PO TABS
1.0000 mg | ORAL_TABLET | Freq: Two times a day (BID) | ORAL | Status: DC
  Administered 2013-03-20 – 2013-03-24 (×9): 1 mg via ORAL
  Filled 2013-03-20 (×7): qty 1
  Filled 2013-03-20: qty 2
  Filled 2013-03-20: qty 1

## 2013-03-20 MED ORDER — LORAZEPAM 1 MG PO TABS
1.0000 mg | ORAL_TABLET | Freq: Three times a day (TID) | ORAL | Status: DC | PRN
  Administered 2013-03-23 (×2): 1 mg via ORAL
  Filled 2013-03-20 (×2): qty 1

## 2013-03-20 MED ORDER — LORAZEPAM 2 MG/ML IJ SOLN
1.0000 mg | Freq: Four times a day (QID) | INTRAMUSCULAR | Status: AC | PRN
  Administered 2013-03-21 – 2013-03-22 (×2): 1 mg via INTRAVENOUS
  Filled 2013-03-20 (×2): qty 1

## 2013-03-20 MED ORDER — LEVOTHYROXINE SODIUM 125 MCG PO TABS
125.0000 ug | ORAL_TABLET | Freq: Every day | ORAL | Status: DC
  Administered 2013-03-21 – 2013-03-24 (×3): 125 ug via ORAL
  Filled 2013-03-20 (×6): qty 1

## 2013-03-20 MED ORDER — ADULT MULTIVITAMIN W/MINERALS CH
1.0000 | ORAL_TABLET | Freq: Every day | ORAL | Status: DC
  Administered 2013-03-20 – 2013-03-22 (×3): 1 via ORAL
  Filled 2013-03-20 (×5): qty 1

## 2013-03-20 MED ORDER — PANTOPRAZOLE SODIUM 40 MG PO TBEC
40.0000 mg | DELAYED_RELEASE_TABLET | Freq: Every day | ORAL | Status: DC
  Administered 2013-03-20 – 2013-03-21 (×2): 40 mg via ORAL
  Filled 2013-03-20 (×2): qty 1

## 2013-03-20 MED ORDER — SERTRALINE HCL 100 MG PO TABS
100.0000 mg | ORAL_TABLET | Freq: Every morning | ORAL | Status: DC
  Administered 2013-03-20 – 2013-03-24 (×5): 100 mg via ORAL
  Filled 2013-03-20 (×2): qty 1
  Filled 2013-03-20: qty 2
  Filled 2013-03-20: qty 1
  Filled 2013-03-20: qty 2

## 2013-03-20 MED ORDER — THIAMINE HCL 100 MG/ML IJ SOLN
100.0000 mg | Freq: Every day | INTRAMUSCULAR | Status: DC
  Administered 2013-03-23 – 2013-03-24 (×2): 100 mg via INTRAVENOUS
  Filled 2013-03-20 (×3): qty 1

## 2013-03-20 MED ORDER — ONDANSETRON 4 MG PO TBDP
4.0000 mg | ORAL_TABLET | Freq: Three times a day (TID) | ORAL | Status: DC | PRN
  Administered 2013-03-20 – 2013-03-22 (×4): 4 mg via ORAL
  Filled 2013-03-20 (×5): qty 1

## 2013-03-20 MED ORDER — LORAZEPAM 1 MG PO TABS
1.0000 mg | ORAL_TABLET | Freq: Four times a day (QID) | ORAL | Status: AC | PRN
  Administered 2013-03-21: 1 mg via ORAL
  Administered 2013-03-21: 2 mg via ORAL
  Filled 2013-03-20 (×2): qty 1

## 2013-03-20 MED ORDER — VITAMIN B-1 100 MG PO TABS
100.0000 mg | ORAL_TABLET | Freq: Every day | ORAL | Status: DC
  Administered 2013-03-20 – 2013-03-22 (×3): 100 mg via ORAL
  Filled 2013-03-20 (×5): qty 1

## 2013-03-20 MED ORDER — LORAZEPAM 1 MG PO TABS
0.0000 mg | ORAL_TABLET | Freq: Two times a day (BID) | ORAL | Status: DC
Start: 2013-03-22 — End: 2013-03-24

## 2013-03-20 MED ORDER — NICOTINE 21 MG/24HR TD PT24
21.0000 mg | MEDICATED_PATCH | Freq: Once | TRANSDERMAL | Status: AC
  Administered 2013-03-20: 21 mg via TRANSDERMAL
  Filled 2013-03-20: qty 1

## 2013-03-20 MED ORDER — ACETAMINOPHEN 500 MG PO TABS
500.0000 mg | ORAL_TABLET | Freq: Four times a day (QID) | ORAL | Status: DC | PRN
  Administered 2013-03-20 – 2013-03-22 (×5): 500 mg via ORAL
  Filled 2013-03-20 (×5): qty 1

## 2013-03-20 MED ORDER — FOLIC ACID 1 MG PO TABS
1.0000 mg | ORAL_TABLET | Freq: Every day | ORAL | Status: DC
  Administered 2013-03-20 – 2013-03-24 (×5): 1 mg via ORAL
  Filled 2013-03-20 (×5): qty 1

## 2013-03-20 NOTE — ED Notes (Addendum)
Pt said today was her first day at the [facility], she's homeless and she'd been drinking before she went there today. She was very concerned about her belongings being there and whether or not they were holding her bed for her so Clinical research associate looked up [employer] number online and gave it to patient and she called them and they are holding her bed at this time. She said she normally drinks a case of beer a day and has for the last couple of months since she became homeless. She's detoxed in the past and her longest period of sobriety is 4 months. She's seen at [facility] and takes Zoloft.

## 2013-03-20 NOTE — ED Notes (Signed)
Called EDP due to pt CIWA score being >10 twice in a row, said to give klonopin early.

## 2013-03-20 NOTE — ED Provider Notes (Signed)
History     CSN: [id number]  Arrival date & time 03/20/13  1201   First MD Initiated Contact with Patient 03/20/13 1221      Chief Complaint  Patient presents with  . Alcohol Intoxication  . Loss of Consciousness    HPI  Patient presents w request for etoh detox.  Though, per report she was found clinically intoxicated, after passing out at a [employer]. She acknowledges drinking significant alcohol quantities, denies other drug use. She acknowledges prior SI, but denies current SI / HI. She denies trauma, falls, physical pain.     Past Medical History  Diagnosis Date  . Peptic ulcer   . Alcohol abuse   . Anemia   . Benzodiazepine abuse   . Depression   . Anxiety   . Thyroid disease   . Alcoholism   . Narcotic abuse   . Back pain   . Thrombocytopenia 06/17/2011  . Hypothyroidism     Past Surgical History  Procedure Laterality Date  . Cholecystectomy    . Abdominal surgery    . Esophagogastroduodenoscopy    . Gastric bypass    . Tubal ligation      Family History  Problem Relation Age of Onset  . Alcohol abuse Father   . Alcoholism Father   . Cancer Other     History  Substance Use Topics  . Smoking status: Current Every Day Smoker -- 1.00 packs/day for 12 years    Types: Cigarettes    Last Attempt to Quit: 08/08/2011  . Smokeless tobacco: Never Used  . Alcohol Use: Yes     Comment: drink 4 liters a day. 1 case/day    OB History   Grav Para Term Preterm Abortions TAB SAB Ect Mult Living   3 3  3      3       Review of Systems  Unable to perform ROS: Other  Patient is clinically intoxicated, though she does deny all other complaints, or recent health changes.   Allergies  Nsaids  Home Medications   Current Outpatient Rx  Name  Route  Sig  Dispense  Refill  . acamprosate (CAMPRAL) 333 MG tablet   Oral   Take 2 tablets (666 mg total) by mouth 3 (three) times daily with meals. For alcoholism   90 tablet   0   . levETIRAcetam (KEPPRA  XR) 500 MG 24 hr tablet   Oral   Take 1 tablet (500 mg total) by mouth every 12 (twelve) hours. For mood stability/seizures   60 tablet   0   . levothyroxine (SYNTHROID, LEVOTHROID) 112 MCG tablet   Oral   Take 1 tablet (112 mcg total) by mouth daily. For hypothyroidism   30 tablet   1   . mirtazapine (REMERON) 15 MG tablet   Oral   Take 1 tablet (15 mg total) by mouth at bedtime. For depression/sleep   30 tablet   0   . nicotine (NICODERM CQ - DOSED IN MG/24 HOURS) 21 mg/24hr patch   Transdermal   Place 1 patch onto the skin daily. For nicotine addiction   28 patch   0   . omeprazole (PRILOSEC) 20 MG capsule   Oral   Take 2 capsules (40 mg total) by mouth daily. For acid reflux         . risperiDONE (RISPERDAL) 0.5 MG tablet   Oral   Take 1 tablet (0.5 mg total) by mouth 2 (two) times daily. For anxiety/mood  control   60 tablet   0   . sertraline (ZOLOFT) 100 MG tablet   Oral   Take 1 tablet (100 mg total) by mouth daily. For depression   30 tablet   0     BP 104/44  Pulse 72  Temp(Src) 98.6 F (37 C) (Oral)  Resp 14  SpO2 98%  LMP 02/20/2013  Physical Exam  Nursing note and vitals reviewed. Constitutional: She is oriented to person, place, and time. She appears well-developed and well-nourished.  Unkempt F resting in lateral recline, no distress  HENT:  Head: Normocephalic and atraumatic.  Eyes: Conjunctivae and EOM are normal.  Cardiovascular: Normal rate and regular rhythm.   Pulmonary/Chest: Effort normal and breath sounds normal. No stridor. No respiratory distress.  Abdominal: She exhibits no distension.  Musculoskeletal: She exhibits no edema.  Neurological: She is alert and oriented to person, place, and time. No cranial nerve deficit.  Skin: Skin is warm and dry.  Psychiatric: Her speech is delayed. She is slowed and withdrawn. Cognition and memory are impaired. She exhibits a depressed mood.    ED Course  Procedures (including critical  care time)  Labs Reviewed  URINE RAPID DRUG SCREEN (HOSP PERFORMED)  CBC  COMPREHENSIVE METABOLIC PANEL  ETHANOL  URINALYSIS, ROUTINE W REFLEX MICROSCOPIC  ACETAMINOPHEN LEVEL  SALICYLATE LEVEL  PREGNANCY, URINE   No results found.   No diagnosis found.  O2- 99%ra, normal   After the initial eval, I reviewed the patient's chart.  Alcohol level +270.  Patient also has cocaine positive urine.  O2-99% ra, normal   Date: 03/20/2013  Rate: 75  Rhythm: normal sinus rhythm  QRS Axis: normal  Intervals: normal  ST/T Wave abnormalities: t wave abnormalities  Conduction Disutrbances:none  Narrative Interpretation:   Old EKG Reviewed: none available Unremarkable     MDM  Female presents with request for alcohol detoxification.  There is no gross evidence of trauma, she is medically stable.  She is however clinically, and numerically intoxicated.  The patient is medically clear other than his intoxication for further assistance from behavioral health for assistance with detoxification.        Gerhard Munch, MD 03/20/13 [phone]

## 2013-03-20 NOTE — Progress Notes (Signed)
[facility] CL has seen patient and provided her with a oc application and a flyer for [employer] of the [place].

## 2013-03-20 NOTE — ED Notes (Signed)
[id number]<AV> Expected date:<BR> Expected time:<BR> Means of arrival:<BR> Comments:<BR> etoh

## 2013-03-20 NOTE — ED Notes (Signed)
PER EMS- pt picked up from [facility] with c/o LOC. Staff reports pt was waiting and passed out, called ems and  reports pt has been drinking mouth wash and is extremely intoxicated.  Arrived to ED alert and oriented.  Given 4mg  zofran PTA.

## 2013-03-21 ENCOUNTER — Encounter (HOSPITAL_COMMUNITY): Payer: Self-pay | Admitting: Internal Medicine

## 2013-03-21 DIAGNOSIS — E039 Hypothyroidism, unspecified: Secondary | ICD-10-CM

## 2013-03-21 DIAGNOSIS — D649 Anemia, unspecified: Secondary | ICD-10-CM

## 2013-03-21 DIAGNOSIS — F10229 Alcohol dependence with intoxication, unspecified: Secondary | ICD-10-CM

## 2013-03-21 DIAGNOSIS — K921 Melena: Secondary | ICD-10-CM

## 2013-03-21 LAB — CBC WITH DIFFERENTIAL/PLATELET
Basophils Absolute: 0.1 10*3/uL (ref 0.0–0.1)
Basophils Relative: 1 % (ref 0–1)
Eosinophils Absolute: 0.1 10*3/uL (ref 0.0–0.7)
Hemoglobin: 8.9 g/dL — ABNORMAL LOW (ref 12.0–15.0)
MCH: 24.7 pg — ABNORMAL LOW (ref 26.0–34.0)
MCHC: 30.4 g/dL (ref 30.0–36.0)
Monocytes Relative: 11 % (ref 3–12)
Neutro Abs: 4.1 10*3/uL (ref 1.7–7.7)
Neutrophils Relative %: 71 % (ref 43–77)
Platelets: 440 10*3/uL — ABNORMAL HIGH (ref 150–400)
RDW: 15.7 % — ABNORMAL HIGH (ref 11.5–15.5)

## 2013-03-21 LAB — BASIC METABOLIC PANEL
Chloride: 100 mEq/L (ref 96–112)
GFR calc Af Amer: 74 mL/min — ABNORMAL LOW (ref >90)
GFR calc non Af Amer: 64 mL/min — ABNORMAL LOW (ref >90)
Potassium: 3.9 mEq/L (ref 3.5–5.1)
Sodium: 135 mEq/L (ref 135–145)

## 2013-03-21 LAB — TYPE AND SCREEN
ABO/RH(D): O POS
Antibody Screen: NEGATIVE

## 2013-03-21 LAB — PROTIME-INR: INR: 0.96 (ref 0.00–1.49)

## 2013-03-21 LAB — RETICULOCYTES
RBC.: 3.51 MIL/uL — ABNORMAL LOW (ref 3.87–5.11)
Retic Count, Absolute: 70.2 10*3/uL (ref 19.0–186.0)

## 2013-03-21 LAB — SAMPLE TO BLOOD BANK

## 2013-03-21 MED ORDER — SODIUM CHLORIDE 0.9 % IV SOLN
80.0000 mg | Freq: Once | INTRAVENOUS | Status: AC
  Administered 2013-03-21: 80 mg via INTRAVENOUS
  Filled 2013-03-21: qty 80

## 2013-03-21 MED ORDER — MORPHINE SULFATE 2 MG/ML IJ SOLN
1.0000 mg | INTRAMUSCULAR | Status: DC | PRN
  Administered 2013-03-22 – 2013-03-24 (×6): 1 mg via INTRAVENOUS
  Filled 2013-03-21 (×6): qty 1

## 2013-03-21 MED ORDER — SODIUM CHLORIDE 0.9 % IJ SOLN
3.0000 mL | Freq: Two times a day (BID) | INTRAMUSCULAR | Status: DC
  Administered 2013-03-22 – 2013-03-23 (×2): 3 mL via INTRAVENOUS

## 2013-03-21 MED ORDER — SODIUM CHLORIDE 0.9 % IV SOLN
INTRAVENOUS | Status: DC
  Administered 2013-03-21 – 2013-03-24 (×4): via INTRAVENOUS

## 2013-03-21 MED ORDER — POTASSIUM CHLORIDE CRYS ER 20 MEQ PO TBCR
40.0000 meq | EXTENDED_RELEASE_TABLET | Freq: Once | ORAL | Status: AC
  Administered 2013-03-21: 40 meq via ORAL
  Filled 2013-03-21: qty 2

## 2013-03-21 MED ORDER — PANTOPRAZOLE SODIUM 40 MG IV SOLR
40.0000 mg | Freq: Two times a day (BID) | INTRAVENOUS | Status: DC
  Filled 2013-03-21: qty 40

## 2013-03-21 MED ORDER — SODIUM CHLORIDE 0.9 % IV SOLN
8.0000 mg/h | INTRAVENOUS | Status: DC
  Administered 2013-03-21 – 2013-03-24 (×6): 8 mg/h via INTRAVENOUS
  Filled 2013-03-21 (×13): qty 80

## 2013-03-21 NOTE — BH Assessment (Signed)
[facility] Assessment Progress Note   Pt declined assessment politely.  She wanted ACT to call [facility] which is a part of [facility] and see if her bed is still available.  If it is, pt wants to be d/c so she won't lose her bed.  ACT contacted Markham Jordan at [facility] and he confirmed he was staff and that pt could maintain her bed.  Pt was informed of this and she indicated she wanted to leave when medically appropriate.  ACT staffed case with MD and he was evaluate and determine if pt can be d/c.  No follow up given as pt did not want any info.  Pt did deny SI, HI and AVH.  Pt denied the same to the initial ED MD upon arrival.

## 2013-03-21 NOTE — ED Notes (Signed)
Called EDP to let know CIWA >10 three times in a row. Ok with giving 2 mg Ativan PO.

## 2013-03-21 NOTE — Progress Notes (Addendum)
Pt found hiding white powder substance contained in wax paper under the cover on her bed.  RN discussed situation w/ charge Passenger transport manager and house coverage Pamala Duffel, Charity fundraiser.  Per Shearer's instructions to maintain pt's safety,  pt's belongings were searched by security and [employer]; the white powder substance in wax paper found under the cover was removed by [employer]; security removed a pair of scissors and they are to be locked up in security office; a package of BC powders w/ 18 doses left and a bottle of contact lense soln were taken to pharmacy by Synetta Fail RN and locked up there.  Nurse Dwana Curd was given report at change of shift and pt's belongings were searched in front of pt by Synetta Fail RN and Dwana Curd RN and pt's belongings were returned to her w/ the exception of a pack of cigarettes and a lighter.  The cigarettes and lighter were labeled and placed in pt's chart.  Pt was made aware that all of the items were removed were removed to maintain her safety.

## 2013-03-21 NOTE — ED Notes (Signed)
Report called to [facility] RN on 5east. Will be transferred to 1515.

## 2013-03-21 NOTE — H&P (Signed)
[facility] History and Physical  Jacqueline [name] QQV:[id number] DOB: April 17, 1966 DOA: 03/20/2013  Referring physician: Dr Freida Busman.  PCP: Default, Provider, MD  Specialists: None  Chief Complaint: Black stool.   HPI: Jacqueline [name] is a 47 y.o. female with PMH significant for PUD, Alcoholism who presents to ED on 6-13 with alcohol intoxication. She was in the ED [facility] area receiving treatment for alcohol withdrawal. Patient report black stool that started morning 6-14. She relates 2 episodes. We were called for admission. Patient relates some nausea and mild epigastric pain. She denies vomiting, no hematemesis or hematochezia. She has a prior history of GI secondary to PUD in 2007. At that time she had an endoscopy. She is suppose to be taking omeprazole, but she ran out. She also report a history of gastric bypass.   Review of Systems: Negative except as per HPI.   Past Medical History  Diagnosis Date  . Peptic ulcer   . Alcohol abuse   . Anemia   . Benzodiazepine abuse   . Depression   . Anxiety   . Thyroid disease   . Alcoholism   . Narcotic abuse   . Back pain   . Thrombocytopenia 06/17/2011  . Hypothyroidism    Past Surgical History  Procedure Laterality Date  . Cholecystectomy    . Abdominal surgery    . Esophagogastroduodenoscopy    . Gastric bypass    . Tubal ligation     Social History:  reports that she has been smoking Cigarettes.  She has a 12 pack-year smoking history. She has never used smokeless tobacco. She reports that  drinks alcohol. She reports that she uses illicit drugs (Oxycodone and Cocaine).She stay at a shelter. She is unemployed. She has 3 children 23, 19 and 16. She She drinks a cage of beer.    Allergies  Allergen Reactions  . Nsaids Other (See Comments)    G.I. Bleed    Family History  Problem Relation Age of Onset  . Alcohol abuse Father   . Alcoholism Father   . Cancer Other     Prior to Admission medications   Medication Sig  Start Date End Date Taking? Authorizing Provider  clonazePAM (KLONOPIN) 1 MG tablet Take 1 mg by mouth 2 (two) times daily.   Yes Historical Provider, MD  levothyroxine (SYNTHROID, LEVOTHROID) 125 MCG tablet Take 125 mcg by mouth daily before breakfast.   Yes Historical Provider, MD  omeprazole (PRILOSEC) 20 MG capsule Take 20 mg by mouth every morning.   Yes Historical Provider, MD  sertraline (ZOLOFT) 100 MG tablet Take 100 mg by mouth every morning.   Yes Historical Provider, MD   Physical Exam: Filed Vitals:   03/21/13 1057 03/21/13 1254 03/21/13 1434 03/21/13 1438  BP: 113/79 119/74 111/74 111/74  Pulse: 94 81 66 66  Temp:  98.9 F (37.2 C) 98.3 F (36.8 C)   TempSrc:  Oral Oral   Resp:  18 18   SpO2:  100% 100%    General Appearance:    Alert, cooperative, no distress, appears stated age  Head:    Normocephalic, without obvious abnormality, atraumatic  Eyes:    PERRL, conjunctiva/corneas clear, EOM's intact    Ears:    Normal TM's and external ear canals, both ears  Nose:   Nares normal, septum midline, mucosa normal, no drainage    or sinus tenderness  Throat:   Lips, mucosa, and tongue normal; teeth and gums normal  Neck:   Supple,  symmetrical, trachea midline, no adenopathy;    thyroid:  no enlargement/tenderness/nodules; no carotid   bruit or JVD  Back:     Symmetric, no curvature, ROM normal, no CVA tenderness  Lungs:     Clear to auscultation bilaterally, respirations unlabored  Chest Wall:    No tenderness or deformity   Heart:    Regular rate and rhythm, S1 and S2 normal, no murmur, rub   or gallop     Abdomen:     Soft, non-tender, bowel sounds active all four quadrants,    no masses, no organomegaly        Extremities:   Extremities normal, atraumatic, no cyanosis or edema  Pulses:   2+ and symmetric all extremities  Skin:   Skin color, texture, turgor normal, no rashes or lesions  Lymph nodes:   Cervical, supraclavicular, and axillary nodes normal   Neurologic:   CNII-XII intact, normal strength, sensation and reflexes    throughout      Labs on Admission:  Basic Metabolic Panel:  Recent Labs Lab 03/20/13 1255 03/21/13 1335  NA 134* 135  K 3.1* 3.9  CL 98 100  CO2 19 27  GLUCOSE 88 140*  BUN 6 13  CREATININE 0.82 1.05  CALCIUM 8.4 9.2   Liver Function Tests:  Recent Labs Lab 03/20/13 1255  AST 21  ALT 15  ALKPHOS 73  BILITOT 0.2*  PROT 7.5  ALBUMIN 4.0   No results found for this basename: LIPASE, AMYLASE,  in the last 168 hours No results found for this basename: AMMONIA,  in the last 168 hours CBC:  Recent Labs Lab 03/20/13 1255 03/21/13 1015  WBC 6.9 5.7  NEUTROABS  --  4.1  HGB 9.7* 8.9*  HCT 30.4* 29.3*  MCV 81.5 81.4  PLT 499* 440*   Cardiac Enzymes: No results found for this basename: CKTOTAL, CKMB, CKMBINDEX, TROPONINI,  in the last 168 hours  BNP (last 3 results) No results found for this basename: PROBNP,  in the last 8760 hours CBG: No results found for this basename: GLUCAP,  in the last 168 hours  Radiological Exams on Admission: No results found.    Assessment/Plan Active Problems:   Hypothyroidism   Anemia   Anxiety disorder   Alcohol dependence with intoxication   Melena  1-Melena: This could be secondary to alcoholic gastritis, PUD. Will admit to telemetry. Will cycle Hb every 8 hours. Will start protonix Gtt. IV fluids, type and screen.  Depending on hemoglobin trend will consider consult GI. Clear diet. Per Dr Freida Busman patient had Guaiac stool positive. Patient Hb at 8.9. Hb baseline 10 to 11.   2-Alcohol Withdrawal: Continue with CIWA protocol, Thiamine and folate. Patient wants to follow up with  [facility].   3-Anxiety: Continue with Klonopin.  4-Drug Abuse: Counseled.  5-Hypothyroidism: Continue with Synthroid.  6-Anemia: secondary to GI bleed. Will check anemia panel.    Code Status: Full Code.  Family Communication:Care discussed with patient.  Disposition Plan:  expect 2 to 3 days inpatient.   Time spent: 75 minutes.   Edrei Norgaard [facility] Pager [phone]  If 7PM-7AM, please contact night-coverage [email or web address] Password [facility] 03/21/2013, 4:18 PM

## 2013-03-21 NOTE — ED Notes (Signed)
Spoke to EDP about patient being on keppra when detoxing from ETOH due to DT's and withdrawal seizures. Pt reports last had DT's in October and cannot tell when she's about to have a seizure. She further reports she feels her withdrawal symptoms are under control at this time. EDP doesn't feel it's necessary to start keppra at this point we will just keep her on Ativan protocol and watch her closely.

## 2013-03-21 NOTE — ED Provider Notes (Addendum)
pts hypokalemia tx with potassium-awaiting placement   3:21 PM Called by nursing do to black and tarry stools. Patient did have guaiac positive stools here. Hemoglobin is lower when compared to yesterday. Spoke with [facility] and she'll be admitted for evaluation GI bleed Toy Baker, MD 03/21/13 1610  Toy Baker, MD 03/21/13 [phone]

## 2013-03-22 LAB — COMPREHENSIVE METABOLIC PANEL
AST: 14 U/L (ref 0–37)
Alkaline Phosphatase: 64 U/L (ref 39–117)
CO2: 28 mEq/L (ref 19–32)
Chloride: 104 mEq/L (ref 96–112)
Creatinine, Ser: 0.92 mg/dL (ref 0.50–1.10)
GFR calc non Af Amer: 75 mL/min — ABNORMAL LOW (ref >90)
Total Bilirubin: 0.3 mg/dL (ref 0.3–1.2)

## 2013-03-22 LAB — IRON AND TIBC: Iron: 27 ug/dL — ABNORMAL LOW (ref 42–135)

## 2013-03-22 LAB — FOLATE: Folate: >20 ng/mL

## 2013-03-22 LAB — HEMOGLOBIN AND HEMATOCRIT, BLOOD
HCT: 26.9 % — ABNORMAL LOW (ref 36.0–46.0)
Hemoglobin: 8.5 g/dL — ABNORMAL LOW (ref 12.0–15.0)

## 2013-03-22 LAB — FERRITIN: Ferritin: 9 ng/mL — ABNORMAL LOW (ref 10–291)

## 2013-03-22 MED ORDER — SENNOSIDES-DOCUSATE SODIUM 8.6-50 MG PO TABS
1.0000 | ORAL_TABLET | Freq: Two times a day (BID) | ORAL | Status: DC
  Administered 2013-03-22 – 2013-03-24 (×5): 1 via ORAL
  Filled 2013-03-22 (×6): qty 1

## 2013-03-22 MED ORDER — FERROUS SULFATE 325 (65 FE) MG PO TABS
325.0000 mg | ORAL_TABLET | Freq: Three times a day (TID) | ORAL | Status: DC
  Administered 2013-03-22 – 2013-03-24 (×5): 325 mg via ORAL
  Filled 2013-03-22 (×9): qty 1

## 2013-03-22 MED ORDER — SODIUM CHLORIDE 0.9 % IV SOLN
INTRAVENOUS | Status: DC
  Administered 2013-03-22: 14:00:00 via INTRAVENOUS

## 2013-03-22 MED ORDER — NICOTINE 21 MG/24HR TD PT24
21.0000 mg | MEDICATED_PATCH | Freq: Every day | TRANSDERMAL | Status: DC
  Administered 2013-03-22 – 2013-03-24 (×3): 21 mg via TRANSDERMAL
  Filled 2013-03-22 (×3): qty 1

## 2013-03-22 NOTE — Consult Note (Signed)
Consult for [facility]  Reason for Consult: Melena, anemia, and heme positive stool Referring Physician: [facility].  [name] HPI: This is a 47 year old female admitted for ETOH intoxication who was being treated in [facility] until she started to complain of melena x 2 episodes.  In the ER she was identified to be heme positive.  The patient is s/p Roux-en-Y gastric bypass in 2003 and there is a history of PUD in 2007.  Her GI bleed occurred in [place] and she was supposed to be on PPIs indefinitely, however, she has only been taking Zantac for the past two weeks.  At that time she required 4 units of PRBC.  She complains of some epigastric pain, but no nausea or vomiting.    Past Medical History  Diagnosis Date  . Peptic ulcer   . Alcohol abuse   . Anemia   . Benzodiazepine abuse   . Depression   . Anxiety   . Thyroid disease   . Alcoholism   . Narcotic abuse   . Back pain   . Thrombocytopenia 06/17/2011  . Hypothyroidism     Past Surgical History  Procedure Laterality Date  . Cholecystectomy    . Abdominal surgery    . Esophagogastroduodenoscopy    . Gastric bypass    . Tubal ligation      Family History  Problem Relation Age of Onset  . Alcohol abuse Father   . Alcoholism Father   . Cancer Other     Social History:  reports that she has been smoking Cigarettes.  She has a 12 pack-year smoking history. She has never used smokeless tobacco. She reports that  drinks alcohol. She reports that she uses illicit drugs (Oxycodone and Cocaine).  Allergies:  Allergies  Allergen Reactions  . Nsaids Other (See Comments)    G.I. Bleed    Medications:  Scheduled: . clonazePAM  1 mg Oral BID  . ferrous sulfate  325 mg Oral TID WC  . folic acid  1 mg Oral Daily  . levothyroxine  125 mcg Oral QAC breakfast  . LORazepam  0-4 mg Oral Q6H   Followed by  . LORazepam  0-4 mg Oral Q12H  . multivitamin with minerals  1 tablet Oral Daily  . nicotine  21 mg  Transdermal Daily  . [START ON 03/25/2013] pantoprazole (PROTONIX) IV  40 mg Intravenous Q12H  . senna-docusate  1 tablet Oral BID  . sertraline  100 mg Oral q morning - 10a  . sodium chloride  3 mL Intravenous Q12H  . thiamine  100 mg Oral Daily   Or  . thiamine  100 mg Intravenous Daily   Continuous: . sodium chloride 100 mL/hr at 03/22/13 0636  . pantoprozole (PROTONIX) infusion 8 mg/hr (03/22/13 0518)    Results for orders placed during the hospital encounter of 03/20/13 (from the past 24 hour(s))  BASIC METABOLIC PANEL     Status: Abnormal   Collection Time    03/21/13  1:35 PM      Result Value Range   Sodium 135  135 - 145 mEq/L   Potassium 3.9  3.5 - 5.1 mEq/L   Chloride 100  96 - 112 mEq/L   CO2 27  19 - 32 mEq/L   Glucose, Bld 140 (*) 70 - 99 mg/dL   BUN 13  6 - 23 mg/dL   Creatinine, Ser 1.61  0.50 - 1.10 mg/dL   Calcium 9.2  8.4 - 09.6  mg/dL   GFR calc non Af Amer 64 (*) >90 mL/min   GFR calc Af Amer 74 (*) >90 mL/min  SAMPLE TO BLOOD BANK     Status: None   Collection Time    03/21/13  1:35 PM      Result Value Range   Blood Bank Specimen SAMPLE AVAILABLE FOR TESTING     Sample Expiration 03/24/2013    ABO/RH     Status: None   Collection Time    03/21/13  1:35 PM      Result Value Range   ABO/RH(D) O POS    TYPE AND SCREEN     Status: None   Collection Time    03/21/13  3:06 PM      Result Value Range   ABO/RH(D) O POS     Antibody Screen NEG     Sample Expiration 03/24/2013    PROTIME-INR     Status: None   Collection Time    03/21/13  6:55 PM      Result Value Range   Prothrombin Time 12.7  11.6 - 15.2 seconds   INR 0.96  0.00 - 1.49  HEMOGLOBIN AND HEMATOCRIT, BLOOD     Status: Abnormal   Collection Time    03/21/13  6:55 PM      Result Value Range   Hemoglobin 8.8 (*) 12.0 - 15.0 g/dL   HCT 16.1 (*) 09.6 - 04.5 %  VITAMIN B12     Status: None   Collection Time    03/21/13  6:55 PM      Result Value Range   Vitamin B-12 413  211 - 911  pg/mL  FOLATE     Status: None   Collection Time    03/21/13  6:55 PM      Result Value Range   Folate >20.0    IRON AND TIBC     Status: Abnormal   Collection Time    03/21/13  6:55 PM      Result Value Range   Iron 27 (*) 42 - 135 ug/dL   TIBC 409  811 - 914 ug/dL   Saturation Ratios 6 (*) 20 - 55 %   UIBC 395  125 - 400 ug/dL  FERRITIN     Status: Abnormal   Collection Time    03/21/13  6:55 PM      Result Value Range   Ferritin 9 (*) 10 - 291 ng/mL  RETICULOCYTES     Status: Abnormal   Collection Time    03/21/13  6:55 PM      Result Value Range   Retic Ct Pct 2.0  0.4 - 3.1 %   RBC. 3.51 (*) 3.87 - 5.11 MIL/uL   Retic Count, Manual 70.2  19.0 - 186.0 K/uL  HEMOGLOBIN AND HEMATOCRIT, BLOOD     Status: Abnormal   Collection Time    03/22/13 12:20 AM      Result Value Range   Hemoglobin 8.5 (*) 12.0 - 15.0 g/dL   HCT 78.2 (*) 95.6 - 21.3 %  COMPREHENSIVE METABOLIC PANEL     Status: Abnormal   Collection Time    03/22/13 12:20 AM      Result Value Range   Sodium 139  135 - 145 mEq/L   Potassium 3.5  3.5 - 5.1 mEq/L   Chloride 104  96 - 112 mEq/L   CO2 28  19 - 32 mEq/L   Glucose, Bld 89  70 - 99 mg/dL  BUN 10  6 - 23 mg/dL   Creatinine, Ser 1.61  0.50 - 1.10 mg/dL   Calcium 8.6  8.4 - 09.6 mg/dL   Total Protein 6.0  6.0 - 8.3 g/dL   Albumin 3.3 (*) 3.5 - 5.2 g/dL   AST 14  0 - 37 U/L   ALT 11  0 - 35 U/L   Alkaline Phosphatase 64  39 - 117 U/L   Total Bilirubin 0.3  0.3 - 1.2 mg/dL   GFR calc non Af Amer 75 (*) >90 mL/min   GFR calc Af Amer 87 (*) >90 mL/min     No results found.  ROS:  As stated above in the HPI otherwise negative.  Blood pressure 110/83, pulse 74, temperature 98.5 F (36.9 C), temperature source Oral, resp. rate 18, height 5\' 9"  (1.753 m), weight 193 lb 1.6 oz (87.59 kg), last menstrual period 02/20/2013, SpO2 97.00%.    PE: Gen: NAD, Alert and Oriented HEENT:  [place]/AT, EOMI Neck: Supple, no LAD Lungs: CTA Bilaterally CV: RRR  without M/G/R ABM: Soft, NTND, +BS Ext: No C/C/E  Assessment/Plan: 1) Melena and heme positive stool. 2) Anemia. 3) History of gastric bypass. 4) ETOH abuse.   Further evaluation with an EGD is required with the progressive anemia, melena, heme positive stool, and history of gastric bypass.  No abdominal pain with palpation.    Plan: 1) EGD tomorrow. 2) Continue with IV Protonix.  Zya Finkle D 03/22/2013, 10:36 AM

## 2013-03-22 NOTE — Progress Notes (Signed)
[facility] PROGRESS NOTE  THERSA MOHIUDDIN [name]:[id number] DOB: [birth year] DOA: 03/20/2013 PCP: Default, Provider, MD  Assessment/Plan: 1-Melena: This could be secondary to alcoholic gastritis, PUD. Continue with  protonix Gtt. IV fluids, type and screen. Per Dr Freida Busman patient had Guaiac stool positive.  Hb baseline 10 to 11. Hb at 8.5. Start clear diet. Will consult GI for consideration of endoscopy.    2-Alcohol Withdrawal: Continue with CIWA protocol, Thiamine and folate. Patient wants to follow up with [facility]. Now getting ativan every 12 hours.   3-Anxiety: Continue with Klonopin.   4-Drug Abuse: Counseled.   5-Hypothyroidism: Continue with Synthroid.   6-Anemia: secondary to GI bleed. B 12 at 413, iron 27, ferritin at 9. Will start iron tablet.    Code Status: Full Code.  Family Communication: Care discussed with patient.  Disposition Plan: remain inpatient.    Consultants:  none  Procedures:  none  Antibiotics:  none  HPI/Subjective: Feeling well, no abdominal pain. No more BM.   Objective: Filed Vitals:   03/21/13 2000 03/21/13 2200 03/22/13 0545 03/22/13 0800  BP: 111/71 111/72 103/64 110/83  Pulse: 65 79 70 74  Temp:  98.2 F (36.8 C) 98.5 F (36.9 C)   TempSrc:  Oral Oral   Resp:  20 18   Height:      Weight:   87.59 kg (193 lb 1.6 oz)   SpO2:  98% 97%     Intake/Output Summary (Last 24 hours) at 03/22/13 0919 Last data filed at 03/22/13 0636  Gross per 24 hour  Intake   1358 ml  Output      0 ml  Net   1358 ml   Filed Weights   03/21/13 1644 03/22/13 0545  Weight: 88.451 kg (195 lb) 87.59 kg (193 lb 1.6 oz)    Exam:   General:  No distress.   Cardiovascular: S 1, S 2 RRR  Respiratory: CTA  Abdomen: BS present, soft, NT  Musculoskeletal: no edema.   Data Reviewed: Basic Metabolic Panel:  Recent Labs Lab 03/20/13 1255 03/21/13 1335 03/22/13 0020  NA 134* 135 139  K 3.1* 3.9 3.5  CL 98 100 104  CO2 19 27 28   GLUCOSE  88 140* 89  BUN 6 13 10   CREATININE 0.82 1.05 0.92  CALCIUM 8.4 9.2 8.6   Liver Function Tests:  Recent Labs Lab 03/20/13 1255 03/22/13 0020  AST 21 14  ALT 15 11  ALKPHOS 73 64  BILITOT 0.2* 0.3  PROT 7.5 6.0  ALBUMIN 4.0 3.3*   No results found for this basename: LIPASE, AMYLASE,  in the last 168 hours No results found for this basename: AMMONIA,  in the last 168 hours CBC:  Recent Labs Lab 03/20/13 1255 03/21/13 1015 03/21/13 1855 03/22/13 0020  WBC 6.9 5.7  --   --   NEUTROABS  --  4.1  --   --   HGB 9.7* 8.9* 8.8* 8.5*  HCT 30.4* 29.3* 28.7* 26.9*  MCV 81.5 81.4  --   --   PLT 499* 440*  --   --    Cardiac Enzymes: No results found for this basename: CKTOTAL, CKMB, CKMBINDEX, TROPONINI,  in the last 168 hours BNP (last 3 results) No results found for this basename: PROBNP,  in the last 8760 hours CBG: No results found for this basename: GLUCAP,  in the last 168 hours  No results found for this or any previous visit (from the past 240 hour(s)).   Studies:  No results found.  Scheduled Meds: . clonazePAM  1 mg Oral BID  . folic acid  1 mg Oral Daily  . levothyroxine  125 mcg Oral QAC breakfast  . LORazepam  0-4 mg Oral Q6H   Followed by  . LORazepam  0-4 mg Oral Q12H  . multivitamin with minerals  1 tablet Oral Daily  . nicotine  21 mg Transdermal Daily  . [START ON 03/25/2013] pantoprazole (PROTONIX) IV  40 mg Intravenous Q12H  . sertraline  100 mg Oral q morning - 10a  . sodium chloride  3 mL Intravenous Q12H  . thiamine  100 mg Oral Daily   Or  . thiamine  100 mg Intravenous Daily   Continuous Infusions: . sodium chloride 100 mL/hr at 03/22/13 0636  . pantoprozole (PROTONIX) infusion 8 mg/hr (03/22/13 0518)    Active Problems:   Hypothyroidism   Anemia   Anxiety disorder   Alcohol dependence with intoxication   Melena    Time spent: 25 minutes.     Jacqueline [name]  [facility] Pager [phone]. If 7PM-7AM, please contact  night-coverage at [email or web address], password [facility] 03/22/2013, 9:19 AM  LOS: 2 days

## 2013-03-23 ENCOUNTER — Encounter (HOSPITAL_COMMUNITY): Payer: Self-pay | Admitting: *Deleted

## 2013-03-23 ENCOUNTER — Encounter (HOSPITAL_COMMUNITY)
Admission: EM | Disposition: A | Discharge status: Home / Self Care | Payer: Self-pay | Source: Home / Self Care | Attending: Internal Medicine

## 2013-03-23 DIAGNOSIS — K283 Acute gastrojejunal ulcer without hemorrhage or perforation: Secondary | ICD-10-CM

## 2013-03-23 DIAGNOSIS — K921 Melena: Secondary | ICD-10-CM

## 2013-03-23 DIAGNOSIS — K922 Gastrointestinal hemorrhage, unspecified: Secondary | ICD-10-CM

## 2013-03-23 HISTORY — PX: ESOPHAGOGASTRODUODENOSCOPY: SHX5428

## 2013-03-23 LAB — HEMOGLOBIN AND HEMATOCRIT, BLOOD
HCT: 28.1 % — ABNORMAL LOW (ref 36.0–46.0)
Hemoglobin: 8.9 g/dL — ABNORMAL LOW (ref 12.0–15.0)

## 2013-03-23 SURGERY — EGD (ESOPHAGOGASTRODUODENOSCOPY)
Anesthesia: Moderate Sedation

## 2013-03-23 MED ORDER — MIDAZOLAM HCL 10 MG/2ML IJ SOLN
INTRAMUSCULAR | Status: AC
  Filled 2013-03-23: qty 2

## 2013-03-23 MED ORDER — FENTANYL CITRATE 0.05 MG/ML IJ SOLN
INTRAMUSCULAR | Status: DC | PRN
  Administered 2013-03-23 (×4): 25 ug via INTRAVENOUS

## 2013-03-23 MED ORDER — FENTANYL CITRATE 0.05 MG/ML IJ SOLN
INTRAMUSCULAR | Status: AC
  Filled 2013-03-23: qty 2

## 2013-03-23 MED ORDER — MIDAZOLAM HCL 10 MG/2ML IJ SOLN
INTRAMUSCULAR | Status: DC | PRN
  Administered 2013-03-23 (×3): 2 mg via INTRAVENOUS
  Administered 2013-03-23: 1 mg via INTRAVENOUS

## 2013-03-23 NOTE — Interval H&P Note (Signed)
History and Physical Interval Note:  03/23/2013 1:39 PM  Jacqueline [name]  has presented today for surgery, with the diagnosis of Melena, Anemia, and Heme positive stool.  The various methods of treatment have been discussed with the patient and family. After consideration of risks, benefits and other options for treatment, the patient has consented to  Procedure(s): ESOPHAGOGASTRODUODENOSCOPY (EGD) (N/A) as a surgical intervention .  The patient's history has been reviewed, patient examined, no change in status, stable for surgery.  I have reviewed the patient's chart and labs.  Questions were answered to the patient's satisfaction.     Venita Lick. Russella Dar MD

## 2013-03-23 NOTE — Op Note (Addendum)
[facility] [address] [place], [address]   ENDOSCOPY PROCEDURE REPORT  PATIENT: Jacqueline [name], Jacqueline [name]  MR#: [id number] BIRTHDATE: [birth year] , 46  yrs. old GENDER: Female ENDOSCOPIST: Meryl Dare, MD, [facility] REFERRED BY:  [facility] PROCEDURE DATE:  03/23/2013 PROCEDURE:  EGD, diagnostic ASA CLASS:     Class II INDICATIONS:  Melena.  Occult blood positive. Epigastric pain. MEDICATIONS: medications were titrated to patient response per physician's verbal order, Fentanyl 100 mcg IV, and Versed 7 mg IV  TOPICAL ANESTHETIC: Cetacaine Spray DESCRIPTION OF PROCEDURE: After the risks benefits and alternatives of the procedure were thoroughly explained, informed consent was obtained.  The PENTAX GASTOROSCOPE [id number] endoscope was introduced through the mouth and advanced to the proximal jejunum without limitations.  The instrument was slowly withdrawn as the mucosa was fully examined.   JEJUNUM: A medium sized non-bleeding shallow, round and clean-based marginal ulcer was found.  It was immediately distal to the gastric anastomosis. STOMACH: Prior Roux-en-Y gastric bypass surgery. The stomach otherwise appeared normal. ESOPHAGUS: The mucosa of the esophagus appeared normal.  Retroflexed views revealed a small hiatal hernia.  The scope was then withdrawn from the patient and the procedure completed.  COMPLICATIONS: There were no complications.  ENDOSCOPIC IMPRESSION: 1.   Marginal ulcer, clean based 2.   Prior Roux-en-Y gastric bypass  RECOMMENDATIONS: 1.  Avoid ASA/NSAIDS long term 2.  Continue PPI BID for 8 weeks then PPI qam long term for ulcer prevention 3.  Advance diet as tolerated, GI signing off   eSigned:  Meryl Dare, MD, [facility] 03/23/2013 2:10 PM

## 2013-03-23 NOTE — Progress Notes (Signed)
[facility] PROGRESS NOTE  Jacqueline [name] ZOX:[id number] DOB: 1966-10-02 DOA: 03/20/2013 PCP: Default, Provider, MD  Assessment/Plan: 1-Melena: This could be secondary to alcoholic gastritis, PUD. Continue with  protonix Gtt. IV fluids, type and screen. Per Dr Freida Busman patient had Guaiac stool positive.  Hb baseline 10 to 11. Hb at 8.9. Endoscopy today.    2-Alcohol Withdrawal: Continue with CIWA protocol, Thiamine and folate. Patient wants to follow up with [facility]. Ativan PRN.   3-Anxiety: Continue with Klonopin.   4-Drug Abuse: Counseled.   5-Hypothyroidism: Continue with Synthroid.   6-Anemia: secondary to GI bleed. B 12 at 413, iron 27, ferritin at 9. Continue with iron tablet.    Code Status: Full Code.  Family Communication: Care discussed with patient.  Disposition Plan: remain inpatient. Plan for discharge tomorrow depending on endoscopy result.    Consultants:  none  Procedures:  none  Antibiotics:  none  HPI/Subjective: Mild abdominal pain.  No more BM.   Objective: Filed Vitals:   03/22/13 1400 03/22/13 2200 03/23/13 0259 03/23/13 0600  BP: 97/64 115/74 101/58 109/60  Pulse: 79 56 53 82  Temp: 98.5 F (36.9 C) 98.2 F (36.8 C) 98.6 F (37 C) 98.3 F (36.8 C)  TempSrc: Oral Oral Oral Oral  Resp: 18 18 20 20   Height:      Weight:      SpO2: 94% 100% 99% 100%    Intake/Output Summary (Last 24 hours) at 03/23/13 1135 Last data filed at 03/22/13 1700  Gross per 24 hour  Intake   1320 ml  Output      0 ml  Net   1320 ml   Filed Weights   03/21/13 1644 03/22/13 0545  Weight: 88.451 kg (195 lb) 87.59 kg (193 lb 1.6 oz)    Exam:   General:  No distress.   Cardiovascular: S 1, S 2 RRR  Respiratory: CTA  Abdomen: BS present, soft, NT  Musculoskeletal: no edema.   Data Reviewed: Basic Metabolic Panel:  Recent Labs Lab 03/20/13 1255 03/21/13 1335 03/22/13 0020  NA 134* 135 139  K 3.1* 3.9 3.5  CL 98 100 104  CO2 19 27 28    GLUCOSE 88 140* 89  BUN 6 13 10   CREATININE 0.82 1.05 0.92  CALCIUM 8.4 9.2 8.6   Liver Function Tests:  Recent Labs Lab 03/20/13 1255 03/22/13 0020  AST 21 14  ALT 15 11  ALKPHOS 73 64  BILITOT 0.2* 0.3  PROT 7.5 6.0  ALBUMIN 4.0 3.3*   No results found for this basename: LIPASE, AMYLASE,  in the last 168 hours No results found for this basename: AMMONIA,  in the last 168 hours CBC:  Recent Labs Lab 03/20/13 1255 03/21/13 1015 03/21/13 1855 03/22/13 0020 03/22/13 1159 03/23/13 0020  WBC 6.9 5.7  --   --   --   --   NEUTROABS  --  4.1  --   --   --   --   HGB 9.7* 8.9* 8.8* 8.5* 8.5* 8.9*  HCT 30.4* 29.3* 28.7* 26.9* 27.2* 28.1*  MCV 81.5 81.4  --   --   --   --   PLT 499* 440*  --   --   --   --    Cardiac Enzymes: No results found for this basename: CKTOTAL, CKMB, CKMBINDEX, TROPONINI,  in the last 168 hours BNP (last 3 results) No results found for this basename: PROBNP,  in the last 8760 hours CBG: No results found  for this basename: GLUCAP,  in the last 168 hours  No results found for this or any previous visit (from the past 240 hour(s)).   Studies: No results found.  Scheduled Meds: . clonazePAM  1 mg Oral BID  . ferrous sulfate  325 mg Oral TID WC  . folic acid  1 mg Oral Daily  . levothyroxine  125 mcg Oral QAC breakfast  . LORazepam  0-4 mg Oral Q12H  . multivitamin with minerals  1 tablet Oral Daily  . nicotine  21 mg Transdermal Daily  . [START ON 03/25/2013] pantoprazole (PROTONIX) IV  40 mg Intravenous Q12H  . senna-docusate  1 tablet Oral BID  . sertraline  100 mg Oral q morning - 10a  . sodium chloride  3 mL Intravenous Q12H  . thiamine  100 mg Oral Daily   Or  . thiamine  100 mg Intravenous Daily   Continuous Infusions: . sodium chloride 100 mL/hr at 03/22/13 1649  . sodium chloride 20 mL/hr at 03/22/13 1333  . pantoprozole (PROTONIX) infusion 8 mg/hr (03/23/13 0558)    Active Problems:   Hypothyroidism   Anemia   Anxiety  disorder   Alcohol dependence with intoxication   Melena    Time spent: 25 minutes.     Jacqueline [name]  [facility] Pager [phone]. If 7PM-7AM, please contact night-coverage at [email or web address], password [facility] 03/23/2013, 11:35 AM  LOS: 3 days

## 2013-03-24 DIAGNOSIS — K283 Acute gastrojejunal ulcer without hemorrhage or perforation: Secondary | ICD-10-CM

## 2013-03-24 DIAGNOSIS — K921 Melena: Secondary | ICD-10-CM

## 2013-03-24 DIAGNOSIS — F10229 Alcohol dependence with intoxication, unspecified: Secondary | ICD-10-CM

## 2013-03-24 DIAGNOSIS — F411 Generalized anxiety disorder: Secondary | ICD-10-CM

## 2013-03-24 DIAGNOSIS — D649 Anemia, unspecified: Secondary | ICD-10-CM

## 2013-03-24 LAB — CBC
HCT: 27.6 % — ABNORMAL LOW (ref 36.0–46.0)
MCH: 24.6 pg — ABNORMAL LOW (ref 26.0–34.0)
MCHC: 29.7 g/dL — ABNORMAL LOW (ref 30.0–36.0)
MCV: 82.6 fL (ref 78.0–100.0)
Platelets: 359 10*3/uL (ref 150–400)
RDW: 16.1 % — ABNORMAL HIGH (ref 11.5–15.5)
WBC: 5 10*3/uL (ref 4.0–10.5)

## 2013-03-24 MED ORDER — OMEPRAZOLE 20 MG PO CPDR: 40.0000 mg | DELAYED_RELEASE_CAPSULE | Freq: Two times a day (BID) | ORAL | Status: DC

## 2013-03-24 MED ORDER — FERROUS SULFATE 325 (65 FE) MG PO TABS: 325.0000 mg | ORAL_TABLET | Freq: Three times a day (TID) | ORAL | Status: DC

## 2013-03-24 MED ORDER — LEVOTHYROXINE SODIUM 125 MCG PO TABS: 125.0000 ug | ORAL_TABLET | Freq: Every day | ORAL | Status: DC

## 2013-03-24 MED ORDER — ADULT MULTIVITAMIN W/MINERALS CH: 1.0000 | ORAL_TABLET | Freq: Every day | ORAL | Status: DC

## 2013-03-24 MED ORDER — SENNOSIDES-DOCUSATE SODIUM 8.6-50 MG PO TABS: 1.0000 | ORAL_TABLET | Freq: Two times a day (BID) | ORAL | Status: DC

## 2013-03-24 MED ORDER — CLONAZEPAM 1 MG PO TABS: 1.0000 mg | ORAL_TABLET | Freq: Two times a day (BID) | ORAL | Status: DC

## 2013-03-24 NOTE — Progress Notes (Signed)
Dc teaching complete. Pt given bus pass, d/c'd.  Pt did not want to set up my chart. Barnett Hatter P

## 2013-03-24 NOTE — Discharge Summary (Signed)
Physician Discharge Summary  Jacqueline [name] ZOX:[id number] DOB: 17-Oct-1965 DOA: 03/20/2013  PCP: Default, Provider, MD  Admit date: 03/20/2013 Discharge date: 03/24/2013  Time spent: 35 minutes  Recommendations for Outpatient Follow-up:  1. Needs to follow up with PCP, need repeat hb.    Discharge Diagnoses:   Anastomotic ulcer, acute  Acute blood loss anemia.    Hypothyroidism   Anxiety disorder   Alcohol dependence with intoxication   Melena     Discharge Condition: stable  Diet recommendation: Low fat diet  Filed Weights   03/21/13 1644 03/22/13 0545  Weight: 88.451 kg (195 lb) 87.59 kg (193 lb 1.6 oz)    History of present illness:  Jacqueline [name] is a 47 y.o. female with PMH significant for PUD, Alcoholism who presents to ED on 6-13 with alcohol intoxication. She was in the ED [facility] area receiving treatment for alcohol withdrawal. Patient report black stool that started morning 6-14. She relates 2 episodes. We were called for admission. Patient relates some nausea and mild epigastric pain. She denies vomiting, no hematemesis or hematochezia. She has a prior history of GI secondary to PUD in 2007. At that time she had an endoscopy. She is suppose to be taking omeprazole, but she ran out. She also report a history of gastric bypass.    Hospital Course:  1-Melena: Secondary to ulcer. Patient was treated with protonix Gtt. IV fluids, type and screen. Hb baseline 10 to 11. Hb on admission at 8.9. Endoscopy show jejunal clean base ulcer. Prior Roux-en-Y gastric bypass. Patient will need PPI BID for 8 weeks then daily for long term. Hb remain stable. Iron supplement prescription was provide. Patient was able to tolerates regular diet prior to discharge.   2-Alcohol Withdrawal: Patient was monitor on CIWA protocol. She received Ativan, Thiamine and folate. Patient wants to follow up with [facility].  3-Anxiety: Continue with Klonopin.  4-Drug Abuse: Counseled.  5-Hypothyroidism:  Continue with Synthroid.  6-Anemia: secondary to GI bleed. B 12 at 413, iron 27, ferritin at 9. Continue with iron tablet. Hb stable.    Procedures: Endoscopy: JEJUNUM: A medium sized non-bleeding shallow, round and clean-based  marginal ulcer was found. Prior Roux-en-Y gastric bypass  Consultations: [facility]  Discharge Exam: Filed Vitals:   03/23/13 1405 03/23/13 1410 03/23/13 2119 03/24/13 0539  BP: 122/61 130/63 114/74 100/64  Pulse: 53 55 63 52  Temp:  96.8 F (36 C) 97.6 F (36.4 C) 98.3 F (36.8 C)  TempSrc:  Oral Oral Oral  Resp: 15 16 18 18   Height:      Weight:      SpO2: 92% 96% 98% 95%    General: no distress.  Cardiovascular: S 1, S 2 RRR Respiratory: CTA Abdomen: soft, nt nd  Discharge Instructions  Discharge Orders   Future Orders Complete By Expires     Increase activity slowly  As directed         Medication List    TAKE these medications       clonazePAM 1 MG tablet  Commonly known as:  KLONOPIN  Take 1 tablet (1 mg total) by mouth 2 (two) times daily.     ferrous sulfate 325 (65 FE) MG tablet  Take 1 tablet (325 mg total) by mouth 3 (three) times daily with meals.     levothyroxine 125 MCG tablet  Commonly known as:  SYNTHROID, LEVOTHROID  Take 1 tablet (125 mcg total) by mouth daily before breakfast.     multivitamin with  minerals Tabs  Take 1 tablet by mouth daily.     omeprazole 20 MG capsule  Commonly known as:  PRILOSEC  Take 2 capsules (40 mg total) by mouth 2 (two) times daily.     senna-docusate 8.6-50 MG per tablet  Commonly known as:  Senokot-S  Take 1 tablet by mouth 2 (two) times daily.     sertraline 100 MG tablet  Commonly known as:  ZOLOFT  Take 100 mg by mouth every morning.       Allergies  Allergen Reactions  . Nsaids Other (See Comments)    G.I. Bleed      The results of significant diagnostics from this hospitalization (including imaging, microbiology, ancillary and laboratory) are listed below for  reference.    Significant Diagnostic Studies: No results found.  Microbiology: No results found for this or any previous visit (from the past 240 hour(s)).   Labs: Basic Metabolic Panel:  Recent Labs Lab 03/20/13 1255 03/21/13 1335 03/22/13 0020  NA 134* 135 139  K 3.1* 3.9 3.5  CL 98 100 104  CO2 19 27 28   GLUCOSE 88 140* 89  BUN 6 13 10   CREATININE 0.82 1.05 0.92  CALCIUM 8.4 9.2 8.6   Liver Function Tests:  Recent Labs Lab 03/20/13 1255 03/22/13 0020  AST 21 14  ALT 15 11  ALKPHOS 73 64  BILITOT 0.2* 0.3  PROT 7.5 6.0  ALBUMIN 4.0 3.3*   No results found for this basename: LIPASE, AMYLASE,  in the last 168 hours No results found for this basename: AMMONIA,  in the last 168 hours CBC:  Recent Labs Lab 03/20/13 1255 03/21/13 1015 03/21/13 1855 03/22/13 0020 03/22/13 1159 03/23/13 0020 03/24/13 0520  WBC 6.9 5.7  --   --   --   --  5.0  NEUTROABS  --  4.1  --   --   --   --   --   HGB 9.7* 8.9* 8.8* 8.5* 8.5* 8.9* 8.2*  HCT 30.4* 29.3* 28.7* 26.9* 27.2* 28.1* 27.6*  MCV 81.5 81.4  --   --   --   --  82.6  PLT 499* 440*  --   --   --   --  359   Cardiac Enzymes: No results found for this basename: CKTOTAL, CKMB, CKMBINDEX, TROPONINI,  in the last 168 hours BNP: BNP (last 3 results) No results found for this basename: PROBNP,  in the last 8760 hours CBG: No results found for this basename: GLUCAP,  in the last 168 hours     Signed:  Jkwon Treptow  [facility] 03/24/2013, 9:43 AM

## 2013-03-24 NOTE — Progress Notes (Signed)
CARE MANAGEMENT NOTE 03/24/2013  Patient:  Jacqueline [name], Jacqueline [name]   Account Number:  [id number]  Date Initiated:  03/23/2013  Documentation initiated by:  [facility]  Subjective/Objective Assessment:   47 year old female admitted with melena.     Action/Plan:   Lived at home PTA.   Anticipated DC Date:  03/26/2013   Anticipated DC Plan:  HOME/SELF CARE     DC Planning Services  CM consult      Status of service:   Medicare Important Message given?  NA - LOS <3 / Initial given by admissions    Per UR Regulation:  Reviewed for med. necessity/level of care/duration of stay    Comments:  03/24/13 Lawsyn Heiler RN BSN New medications that patient is being discharged on are over the counter hence not eligible for [facility] program. She stated she had a few of her medications left that she was taking before she came to the hospital. She has plans on following up with the [facility] who I called and they confirmed they can assist with medications.

## 2013-03-24 NOTE — Progress Notes (Signed)
CSW received notification that pt needing assistance with transportation home at d/c. CSW met with pt at bedside and provided bus pass.   Unit Nurse Secretary notified in order to inform pt RN.  No further CSW needs identified.   CSW signing off.  Jacklynn Lewis, MSW, LCSWA  Clinical Social Work [phone]

## 2013-03-25 ENCOUNTER — Emergency Department (HOSPITAL_COMMUNITY)
Admission: EM | Admit: 2013-03-25 | Discharge: 2013-03-25 | Disposition: A | Discharge status: Home / Self Care | Payer: Self-pay | Attending: Emergency Medicine | Admitting: Emergency Medicine

## 2013-03-25 ENCOUNTER — Encounter (HOSPITAL_COMMUNITY): Payer: Self-pay | Admitting: *Deleted

## 2013-03-25 DIAGNOSIS — Z862 Personal history of diseases of the blood and blood-forming organs and certain disorders involving the immune mechanism: Secondary | ICD-10-CM | POA: Insufficient documentation

## 2013-03-25 DIAGNOSIS — F17200 Nicotine dependence, unspecified, uncomplicated: Secondary | ICD-10-CM | POA: Insufficient documentation

## 2013-03-25 DIAGNOSIS — Z8659 Personal history of other mental and behavioral disorders: Secondary | ICD-10-CM | POA: Insufficient documentation

## 2013-03-25 DIAGNOSIS — F3289 Other specified depressive episodes: Secondary | ICD-10-CM | POA: Insufficient documentation

## 2013-03-25 DIAGNOSIS — Z3202 Encounter for pregnancy test, result negative: Secondary | ICD-10-CM | POA: Insufficient documentation

## 2013-03-25 DIAGNOSIS — F1910 Other psychoactive substance abuse, uncomplicated: Secondary | ICD-10-CM | POA: Insufficient documentation

## 2013-03-25 DIAGNOSIS — E039 Hypothyroidism, unspecified: Secondary | ICD-10-CM | POA: Insufficient documentation

## 2013-03-25 DIAGNOSIS — Z79899 Other long term (current) drug therapy: Secondary | ICD-10-CM | POA: Insufficient documentation

## 2013-03-25 DIAGNOSIS — D649 Anemia, unspecified: Secondary | ICD-10-CM | POA: Insufficient documentation

## 2013-03-25 DIAGNOSIS — F411 Generalized anxiety disorder: Secondary | ICD-10-CM | POA: Insufficient documentation

## 2013-03-25 DIAGNOSIS — F329 Major depressive disorder, single episode, unspecified: Secondary | ICD-10-CM | POA: Insufficient documentation

## 2013-03-25 DIAGNOSIS — G40909 Epilepsy, unspecified, not intractable, without status epilepticus: Secondary | ICD-10-CM | POA: Insufficient documentation

## 2013-03-25 DIAGNOSIS — E079 Disorder of thyroid, unspecified: Secondary | ICD-10-CM | POA: Insufficient documentation

## 2013-03-25 DIAGNOSIS — Z8711 Personal history of peptic ulcer disease: Secondary | ICD-10-CM | POA: Insufficient documentation

## 2013-03-25 LAB — CBC WITH DIFFERENTIAL/PLATELET
Basophils Relative: 1 % (ref 0–1)
Eosinophils Absolute: 0.1 10*3/uL (ref 0.0–0.7)
Lymphs Abs: 2.1 10*3/uL (ref 0.7–4.0)
MCH: 26.2 pg (ref 26.0–34.0)
Neutro Abs: 2.5 10*3/uL (ref 1.7–7.7)
Neutrophils Relative %: 45 % (ref 43–77)
Platelets: 431 10*3/uL — ABNORMAL HIGH (ref 150–400)
RBC: 3.47 MIL/uL — ABNORMAL LOW (ref 3.87–5.11)

## 2013-03-25 LAB — BASIC METABOLIC PANEL
Chloride: 106 mEq/L (ref 96–112)
GFR calc Af Amer: 68 mL/min — ABNORMAL LOW (ref >90)
GFR calc non Af Amer: 59 mL/min — ABNORMAL LOW (ref >90)
Glucose, Bld: 89 mg/dL (ref 70–99)
Potassium: 3.7 mEq/L (ref 3.5–5.1)
Sodium: 143 mEq/L (ref 135–145)

## 2013-03-25 LAB — PREGNANCY, URINE: Preg Test, Ur: NEGATIVE

## 2013-03-25 LAB — RAPID URINE DRUG SCREEN, HOSP PERFORMED
Barbiturates: NOT DETECTED
Cocaine: POSITIVE — AB
Tetrahydrocannabinol: NOT DETECTED

## 2013-03-25 MED ORDER — NICOTINE 21 MG/24HR TD PT24
MEDICATED_PATCH | TRANSDERMAL | Status: AC
  Administered 2013-03-25: 21 mg via TRANSDERMAL
  Filled 2013-03-25: qty 1

## 2013-03-25 MED ORDER — NICOTINE 21 MG/24HR TD PT24: 21.0000 mg | MEDICATED_PATCH | Freq: Every day | TRANSDERMAL | Status: DC

## 2013-03-25 NOTE — ED Notes (Signed)
Pt here under IVC taken out by mother.  Per papers, pt is SI and drinking ETOH and using illegal drugs.  Pt was just d/c from [facility] for GI bleed and detox yesterday.  Was suppose to go to homeless shelter at that time but instead was picked up by boyfriend and drank ETOH and used cocaine.  PT denies SI at this time stating, "I don't want to kill myself.  I just made a bad choice and I want to get on the right track."  Pt calm, cooperative, nad noted.  [facility] at bedside at this time.  Pt currently calling [facility] to verify bed availability.  nad noted.

## 2013-03-25 NOTE — ED Notes (Signed)
Brought in for IVC, stastes he mother is mad at her and that is why she is here

## 2013-03-25 NOTE — ED Provider Notes (Signed)
History     CSN: [id number]  Arrival date & time 03/25/13  1036   First MD Initiated Contact with Patient 03/25/13 1041      Chief Complaint  Patient presents with  . V70.1     HPI Pt was seen at 1055.  Per pt and Police, c/o gradual onset and persistence of constant etoh abuse for "a while now."  Pt states she does not want to stop drinking etoh and "this makes my mother mad."  States her mother "wants to control me" and took out IVC paperwork on her for "my drinking."  States she was discharged a few days ago from [facility] for GI bleeding. Endorses she is staying at "the [facility]."  Denies SI, no SA, no HI.    Past Medical History  Diagnosis Date  . Peptic ulcer   . Alcohol abuse   . Anemia   . Benzodiazepine abuse   . Depression   . Anxiety   . Thyroid disease   . Alcoholism   . Narcotic abuse   . Back pain   . Thrombocytopenia 06/17/2011  . Hypothyroidism   . Seizures     Past Surgical History  Procedure Laterality Date  . Cholecystectomy    . Abdominal surgery    . Esophagogastroduodenoscopy    . Gastric bypass    . Tubal ligation      Family History  Problem Relation Age of Onset  . Alcohol abuse Father   . Alcoholism Father   . Cancer Other     History  Substance Use Topics  . Smoking status: Current Every Day Smoker -- 1.00 packs/day for 12 years    Types: Cigarettes    Last Attempt to Quit: 08/08/2011  . Smokeless tobacco: Never Used  . Alcohol Use: Yes     Comment: drink 4 liters a day. 1 case/day    OB History   Grav Para Term Preterm Abortions TAB SAB Ect Mult Living   3 3  3      3       Review of Systems ROS: Statement: All systems negative except as marked or noted in the HPI; Constitutional: Negative for fever and chills. ; ; Eyes: Negative for eye pain, redness and discharge. ; ; ENMT: Negative for ear pain, hoarseness, nasal congestion, sinus pressure and sore throat. ; ; Cardiovascular: Negative for chest pain,  palpitations, diaphoresis, dyspnea and peripheral edema. ; ; Respiratory: Negative for cough, wheezing and stridor. ; ; Gastrointestinal: Negative for nausea, vomiting, diarrhea, abdominal pain, blood in stool, hematemesis, jaundice and rectal bleeding. . ; ; Genitourinary: Negative for dysuria, flank pain and hematuria. ; ; Musculoskeletal: Negative for back pain and neck pain. Negative for swelling and trauma.; ; Skin: Negative for pruritus, rash, abrasions, blisters, bruising and skin lesion.; ; Neuro: Negative for headache, lightheadedness and neck stiffness. Negative for weakness, altered level of consciousness , altered mental status, extremity weakness, paresthesias, involuntary movement, seizure and syncope.; Psych:  No SI, no SA, no HI, no hallucinations.        Allergies  Nsaids  Home Medications   Current Outpatient Rx  Name  Route  Sig  Dispense  Refill  . clonazePAM (KLONOPIN) 1 MG tablet   Oral   Take 1 tablet (1 mg total) by mouth 2 (two) times daily.   30 tablet   0   . ferrous sulfate 325 (65 FE) MG tablet   Oral   Take 1 tablet (325 mg  total) by mouth 3 (three) times daily with meals.   90 tablet   1   . levothyroxine (SYNTHROID, LEVOTHROID) 125 MCG tablet   Oral   Take 1 tablet (125 mcg total) by mouth daily before breakfast.   30 tablet   0   . Multiple Vitamin (MULTIVITAMIN WITH MINERALS) TABS   Oral   Take 1 tablet by mouth daily.   30 tablet   0   . omeprazole (PRILOSEC) 20 MG capsule   Oral   Take 2 capsules (40 mg total) by mouth 2 (two) times daily.   60 capsule   0   . senna-docusate (SENOKOT-S) 8.6-50 MG per tablet   Oral   Take 1 tablet by mouth 2 (two) times daily.   60 tablet   0   . sertraline (ZOLOFT) 100 MG tablet   Oral   Take 100 mg by mouth every morning.           BP 95/58  Pulse 74  Temp(Src) 98.2 F (36.8 C) (Oral)  Resp 16  Ht 5\' 9"  (1.753 m)  Wt 190 lb (86.183 kg)  BMI 28.05 kg/m2  SpO2 99%  LMP  02/24/2013  Physical Exam 1100: Physical examination:  Nursing notes reviewed; Vital signs and O2 SAT reviewed;  Constitutional: Well developed, Well nourished, Well hydrated, In no acute distress; Head:  Normocephalic, atraumatic; Eyes: EOMI, PERRL, No scleral icterus; ENMT: Mouth and pharynx normal, Mucous membranes moist; Neck: Supple, Full range of motion, No lymphadenopathy; Cardiovascular: Regular rate and rhythm, No murmur, rub, or gallop; Respiratory: Breath sounds clear & equal bilaterally, No rales, rhonchi, wheezes.  Speaking full sentences with ease, Normal respiratory effort/excursion; Chest: Nontender, Movement normal; Abdomen: Soft, Nontender, Nondistended, Normal bowel sounds; Genitourinary: No CVA tenderness; Extremities: Pulses normal, No tenderness, No edema, No calf edema or asymmetry.; Neuro: AA&Ox3, Major CN grossly intact.  Speech clear. Gait steady. No gross focal motor or sensory deficits in extremities.; Skin: Color normal, Warm, Dry.; Psych:  Denies SI. Calm, cooperative.     ED Course  Procedures    MDM  MDM Reviewed: previous chart, nursing note and vitals Reviewed previous: labs Interpretation: labs     Results for orders placed during the hospital encounter of 03/25/13  ETHANOL      Result Value Range   Alcohol, Ethyl (B) 188 (*) 0 - 11 mg/dL  URINE RAPID DRUG SCREEN (HOSP PERFORMED)      Result Value Range   Opiates POSITIVE (*) NONE DETECTED   Cocaine POSITIVE (*) NONE DETECTED   Benzodiazepines NONE DETECTED  NONE DETECTED   Amphetamines NONE DETECTED  NONE DETECTED   Tetrahydrocannabinol NONE DETECTED  NONE DETECTED   Barbiturates NONE DETECTED  NONE DETECTED  PREGNANCY, URINE      Result Value Range   Preg Test, Ur NEGATIVE  NEGATIVE  CBC WITH DIFFERENTIAL      Result Value Range   WBC 5.5  4.0 - 10.5 K/uL   RBC 3.47 (*) 3.87 - 5.11 MIL/uL   Hemoglobin 9.1 (*) 12.0 - 15.0 g/dL   HCT 16.1 (*) 09.6 - 04.5 %   MCV 81.3  78.0 - 100.0 fL   MCH  26.2  26.0 - 34.0 pg   MCHC 32.3  30.0 - 36.0 g/dL   RDW 40.9 (*) 81.1 - 91.4 %   Platelets 431 (*) 150 - 400 K/uL   Neutrophils Relative % 45  43 - 77 %   Neutro Abs 2.5  1.7 -  7.7 K/uL   Lymphocytes Relative 38  12 - 46 %   Lymphs Abs 2.1  0.7 - 4.0 K/uL   Monocytes Relative 13 (*) 3 - 12 %   Monocytes Absolute 0.7  0.1 - 1.0 K/uL   Eosinophils Relative 3  0 - 5 %   Eosinophils Absolute 0.1  0.0 - 0.7 K/uL   Basophils Relative 1  0 - 1 %   Basophils Absolute 0.1  0.0 - 0.1 K/uL  BASIC METABOLIC PANEL      Result Value Range   Sodium 143  135 - 145 mEq/L   Potassium 3.7  3.5 - 5.1 mEq/L   Chloride 106  96 - 112 mEq/L   CO2 23  19 - 32 mEq/L   Glucose, Bld 89  70 - 99 mg/dL   BUN 6  6 - 23 mg/dL   Creatinine, Ser 1.91 (*) 0.50 - 1.10 mg/dL   Calcium 8.9  8.4 - 47.8 mg/dL   GFR calc non Af Amer 59 (*) >90 mL/min   GFR calc Af Amer 68 (*) >90 mL/min   Results for YARELIZ, THORSTENSON (MRN [id number]) as of 03/25/2013 16:05  Ref. Range 03/22/2013 00:20 03/22/2013 11:59 03/23/2013 00:20 03/24/2013 05:20 03/25/2013 11:08  Hemoglobin Latest Range: 12.0-15.0 g/dL 8.5 (L) 8.5 (L) 8.9 (L) 8.2 (L) 9.1 (L)  HCT Latest Range: 36.0-46.0 % 26.9 (L) 27.2 (L) 28.1 (L) 27.6 (L) 28.2 (L)    1545:  Telepsych Dr. Leretha Pol has eval:  States pt does not have SI/HI, not acutely psychotic or manic, denies neuro-vegetative symptoms for major depression, pt told Dr. Leretha Pol that she regrets she relapsed on drugs and etoh and has a bed at a rehab facility.  Dr. Leretha Pol states pt does not require psych admission and has rescinded the IVC.  She recommends outpatient substance abuse treatment.  No new meds. Pt would like to go home now. Pt appears clinically sober, walking around ED with steady gait, easy resps, has tol PO well. No s/s of withdrawal.  Dx and testing d/w pt.  Questions answered.  Verb understanding, agreeable to d/c home with outpt f/u.         Laray Anger, DO 03/25/13 2202

## 2013-03-25 NOTE — ED Notes (Signed)
[facility] called for transport of Pt to [facility] as requested by Pts nurse.

## 2013-03-29 ENCOUNTER — Encounter (HOSPITAL_COMMUNITY): Payer: Self-pay

## 2013-03-29 ENCOUNTER — Inpatient Hospital Stay (HOSPITAL_COMMUNITY)
Admission: AD | Admit: 2013-03-29 | Discharge: 2013-04-02 | DRG: 897 | Disposition: A | Discharge status: Home / Self Care | Payer: Federal, State, Local not specified - Other | Attending: Psychiatry | Admitting: Psychiatry

## 2013-03-29 ENCOUNTER — Emergency Department (HOSPITAL_COMMUNITY)
Admission: EM | Admit: 2013-03-29 | Discharge: 2013-03-29 | Disposition: A | Discharge status: Other Acute Inpatient Hospital | Payer: Federal, State, Local not specified - Other | Attending: Emergency Medicine | Admitting: Emergency Medicine

## 2013-03-29 DIAGNOSIS — F1420 Cocaine dependence, uncomplicated: Secondary | ICD-10-CM | POA: Diagnosis present

## 2013-03-29 DIAGNOSIS — F10232 Alcohol dependence with withdrawal with perceptual disturbance: Secondary | ICD-10-CM

## 2013-03-29 DIAGNOSIS — Z79899 Other long term (current) drug therapy: Secondary | ICD-10-CM | POA: Insufficient documentation

## 2013-03-29 DIAGNOSIS — E039 Hypothyroidism, unspecified: Secondary | ICD-10-CM | POA: Diagnosis present

## 2013-03-29 DIAGNOSIS — F17200 Nicotine dependence, unspecified, uncomplicated: Secondary | ICD-10-CM | POA: Insufficient documentation

## 2013-03-29 DIAGNOSIS — F1994 Other psychoactive substance use, unspecified with psychoactive substance-induced mood disorder: Secondary | ICD-10-CM

## 2013-03-29 DIAGNOSIS — D649 Anemia, unspecified: Secondary | ICD-10-CM | POA: Insufficient documentation

## 2013-03-29 DIAGNOSIS — F1910 Other psychoactive substance abuse, uncomplicated: Secondary | ICD-10-CM | POA: Diagnosis present

## 2013-03-29 DIAGNOSIS — R45851 Suicidal ideations: Secondary | ICD-10-CM | POA: Insufficient documentation

## 2013-03-29 DIAGNOSIS — Z8659 Personal history of other mental and behavioral disorders: Secondary | ICD-10-CM | POA: Insufficient documentation

## 2013-03-29 DIAGNOSIS — F1410 Cocaine abuse, uncomplicated: Secondary | ICD-10-CM | POA: Diagnosis present

## 2013-03-29 DIAGNOSIS — F1020 Alcohol dependence, uncomplicated: Principal | ICD-10-CM | POA: Diagnosis present

## 2013-03-29 DIAGNOSIS — F329 Major depressive disorder, single episode, unspecified: Secondary | ICD-10-CM | POA: Insufficient documentation

## 2013-03-29 DIAGNOSIS — Z8669 Personal history of other diseases of the nervous system and sense organs: Secondary | ICD-10-CM | POA: Insufficient documentation

## 2013-03-29 DIAGNOSIS — R4182 Altered mental status, unspecified: Secondary | ICD-10-CM | POA: Insufficient documentation

## 2013-03-29 DIAGNOSIS — F1110 Opioid abuse, uncomplicated: Secondary | ICD-10-CM | POA: Insufficient documentation

## 2013-03-29 DIAGNOSIS — E079 Disorder of thyroid, unspecified: Secondary | ICD-10-CM | POA: Insufficient documentation

## 2013-03-29 DIAGNOSIS — F411 Generalized anxiety disorder: Secondary | ICD-10-CM | POA: Diagnosis present

## 2013-03-29 DIAGNOSIS — F3289 Other specified depressive episodes: Secondary | ICD-10-CM | POA: Insufficient documentation

## 2013-03-29 DIAGNOSIS — Z8711 Personal history of peptic ulcer disease: Secondary | ICD-10-CM | POA: Insufficient documentation

## 2013-03-29 DIAGNOSIS — F419 Anxiety disorder, unspecified: Secondary | ICD-10-CM

## 2013-03-29 DIAGNOSIS — F10239 Alcohol dependence with withdrawal, unspecified: Secondary | ICD-10-CM

## 2013-03-29 LAB — CBC WITH DIFFERENTIAL/PLATELET
Basophils Absolute: 0.1 10*3/uL (ref 0.0–0.1)
HCT: 33.6 % — ABNORMAL LOW (ref 36.0–46.0)
Lymphocytes Relative: 26 % (ref 12–46)
Lymphs Abs: 1.8 10*3/uL (ref 0.7–4.0)
Neutro Abs: 4.4 10*3/uL (ref 1.7–7.7)
Platelets: 396 10*3/uL (ref 150–400)
RBC: 4.15 MIL/uL (ref 3.87–5.11)
RDW: 17.3 % — ABNORMAL HIGH (ref 11.5–15.5)
WBC: 7.2 10*3/uL (ref 4.0–10.5)

## 2013-03-29 LAB — COMPREHENSIVE METABOLIC PANEL
ALT: 32 U/L (ref 0–35)
AST: 73 U/L — ABNORMAL HIGH (ref 0–37)
Alkaline Phosphatase: 87 U/L (ref 39–117)
CO2: 20 mEq/L (ref 19–32)
Chloride: 100 mEq/L (ref 96–112)
GFR calc non Af Amer: 79 mL/min — ABNORMAL LOW (ref >90)
Sodium: 138 mEq/L (ref 135–145)
Total Bilirubin: 0.4 mg/dL (ref 0.3–1.2)

## 2013-03-29 LAB — URINE MICROSCOPIC-ADD ON

## 2013-03-29 LAB — URINALYSIS, ROUTINE W REFLEX MICROSCOPIC
Bilirubin Urine: NEGATIVE
Nitrite: NEGATIVE
Specific Gravity, Urine: 1.015 (ref 1.005–1.030)
Urobilinogen, UA: 0.2 mg/dL (ref 0.0–1.0)

## 2013-03-29 LAB — ETHANOL: Alcohol, Ethyl (B): 362 mg/dL — ABNORMAL HIGH (ref 0–11)

## 2013-03-29 LAB — ACETAMINOPHEN LEVEL: Acetaminophen (Tylenol), Serum: <15 ug/mL (ref 10–30)

## 2013-03-29 LAB — RAPID URINE DRUG SCREEN, HOSP PERFORMED
Barbiturates: NOT DETECTED
Benzodiazepines: NOT DETECTED

## 2013-03-29 MED ORDER — HYDROXYZINE HCL 25 MG PO TABS
25.0000 mg | ORAL_TABLET | Freq: Four times a day (QID) | ORAL | Status: DC | PRN
  Administered 2013-03-29: 25 mg via ORAL
  Filled 2013-03-29: qty 1

## 2013-03-29 MED ORDER — CHLORDIAZEPOXIDE HCL 25 MG PO CAPS
25.0000 mg | ORAL_CAPSULE | Freq: Three times a day (TID) | ORAL | Status: AC
  Administered 2013-03-31 – 2013-04-01 (×3): 25 mg via ORAL
  Filled 2013-03-29 (×3): qty 1

## 2013-03-29 MED ORDER — HYDROXYZINE HCL 25 MG PO TABS: 25.0000 mg | ORAL_TABLET | Freq: Four times a day (QID) | ORAL | Status: AC | PRN

## 2013-03-29 MED ORDER — CHLORDIAZEPOXIDE HCL 25 MG PO CAPS
25.0000 mg | ORAL_CAPSULE | ORAL | Status: DC
  Administered 2013-04-01: 25 mg via ORAL
  Filled 2013-03-29: qty 1

## 2013-03-29 MED ORDER — SERTRALINE HCL 100 MG PO TABS
100.0000 mg | ORAL_TABLET | Freq: Every morning | ORAL | Status: DC
  Administered 2013-03-30: 100 mg via ORAL
  Filled 2013-03-29 (×3): qty 1

## 2013-03-29 MED ORDER — CHLORDIAZEPOXIDE HCL 25 MG PO CAPS
25.0000 mg | ORAL_CAPSULE | Freq: Once | ORAL | Status: AC
  Administered 2013-03-29: 25 mg via ORAL
  Filled 2013-03-29: qty 1

## 2013-03-29 MED ORDER — ACETAMINOPHEN 325 MG PO TABS
650.0000 mg | ORAL_TABLET | ORAL | Status: DC | PRN
  Administered 2013-03-29 (×2): 650 mg via ORAL
  Filled 2013-03-29 (×3): qty 2

## 2013-03-29 MED ORDER — CHLORDIAZEPOXIDE HCL 25 MG PO CAPS
25.0000 mg | ORAL_CAPSULE | Freq: Four times a day (QID) | ORAL | Status: AC | PRN
  Administered 2013-03-29 – 2013-03-30 (×2): 25 mg via ORAL
  Filled 2013-03-29 (×2): qty 1

## 2013-03-29 MED ORDER — ONDANSETRON 4 MG PO TBDP
4.0000 mg | ORAL_TABLET | Freq: Once | ORAL | Status: AC
  Administered 2013-03-29: 4 mg via ORAL
  Filled 2013-03-29: qty 1

## 2013-03-29 MED ORDER — CHLORDIAZEPOXIDE HCL 25 MG PO CAPS
25.0000 mg | ORAL_CAPSULE | Freq: Four times a day (QID) | ORAL | Status: DC | PRN
  Administered 2013-03-29: 25 mg via ORAL
  Filled 2013-03-29: qty 1

## 2013-03-29 MED ORDER — SERTRALINE HCL 50 MG PO TABS
100.0000 mg | ORAL_TABLET | Freq: Every morning | ORAL | Status: DC
  Administered 2013-03-29: 100 mg via ORAL
  Filled 2013-03-29: qty 2

## 2013-03-29 MED ORDER — HYDROXYZINE HCL 50 MG PO TABS
50.0000 mg | ORAL_TABLET | Freq: Every evening | ORAL | Status: DC | PRN
  Administered 2013-03-29 – 2013-03-31 (×3): 50 mg via ORAL
  Filled 2013-03-29 (×3): qty 1
  Filled 2013-03-29: qty 14

## 2013-03-29 MED ORDER — VITAMIN B-1 100 MG PO TABS
100.0000 mg | ORAL_TABLET | Freq: Every day | ORAL | Status: DC
  Administered 2013-03-30 – 2013-04-01 (×3): 100 mg via ORAL
  Filled 2013-03-29 (×5): qty 1

## 2013-03-29 MED ORDER — LEVOTHYROXINE SODIUM 125 MCG PO TABS
125.0000 ug | ORAL_TABLET | Freq: Every day | ORAL | Status: DC
  Administered 2013-03-30 – 2013-04-02 (×4): 125 ug via ORAL
  Filled 2013-03-29 (×6): qty 1

## 2013-03-29 MED ORDER — LEVOTHYROXINE SODIUM 125 MCG PO TABS
125.0000 ug | ORAL_TABLET | Freq: Every day | ORAL | Status: DC
  Filled 2013-03-29: qty 1

## 2013-03-29 MED ORDER — CHLORDIAZEPOXIDE HCL 25 MG PO CAPS
25.0000 mg | ORAL_CAPSULE | Freq: Four times a day (QID) | ORAL | Status: AC
  Administered 2013-03-29 – 2013-03-31 (×6): 25 mg via ORAL
  Filled 2013-03-29 (×6): qty 1

## 2013-03-29 MED ORDER — CHLORDIAZEPOXIDE HCL 25 MG PO CAPS: 25.0000 mg | ORAL_CAPSULE | Freq: Every day | ORAL | Status: DC

## 2013-03-29 MED ORDER — ONDANSETRON HCL 4 MG PO TABS: 4.0000 mg | ORAL_TABLET | Freq: Three times a day (TID) | ORAL | Status: DC | PRN

## 2013-03-29 MED ORDER — CHLORDIAZEPOXIDE HCL 25 MG PO CAPS: 25.0000 mg | ORAL_CAPSULE | Freq: Once | ORAL | Status: DC

## 2013-03-29 MED ORDER — LOPERAMIDE HCL 2 MG PO CAPS: 2.0000 mg | ORAL_CAPSULE | ORAL | Status: DC | PRN

## 2013-03-29 MED ORDER — ONDANSETRON 4 MG PO TBDP
4.0000 mg | ORAL_TABLET | Freq: Four times a day (QID) | ORAL | Status: AC | PRN
  Administered 2013-03-30: 4 mg via ORAL

## 2013-03-29 MED ORDER — SENNOSIDES-DOCUSATE SODIUM 8.6-50 MG PO TABS
1.0000 | ORAL_TABLET | Freq: Two times a day (BID) | ORAL | Status: DC
Start: 2013-03-29 — End: 2013-03-29
  Filled 2013-03-29: qty 1

## 2013-03-29 MED ORDER — ADULT MULTIVITAMIN W/MINERALS CH: 1.0000 | ORAL_TABLET | Freq: Every day | ORAL | Status: DC

## 2013-03-29 MED ORDER — THIAMINE HCL 100 MG/ML IJ SOLN
100.0000 mg | Freq: Once | INTRAMUSCULAR | Status: DC
  Administered 2013-03-29: 100 mg via INTRAMUSCULAR
  Filled 2013-03-29 (×2): qty 2

## 2013-03-29 MED ORDER — FERROUS SULFATE 325 (65 FE) MG PO TABS
325.0000 mg | ORAL_TABLET | Freq: Three times a day (TID) | ORAL | Status: DC
  Filled 2013-03-29 (×2): qty 1

## 2013-03-29 MED ORDER — ONDANSETRON 4 MG PO TBDP
4.0000 mg | ORAL_TABLET | Freq: Four times a day (QID) | ORAL | Status: DC | PRN
  Administered 2013-03-29: 4 mg via ORAL
  Filled 2013-03-29: qty 1

## 2013-03-29 MED ORDER — MAGNESIUM HYDROXIDE 400 MG/5ML PO SUSP
30.0000 mL | Freq: Every day | ORAL | Status: DC | PRN
  Administered 2013-03-31: 30 mL via ORAL

## 2013-03-29 MED ORDER — ACETAMINOPHEN 325 MG PO TABS
650.0000 mg | ORAL_TABLET | Freq: Four times a day (QID) | ORAL | Status: DC | PRN
  Administered 2013-03-30 – 2013-04-01 (×4): 650 mg via ORAL

## 2013-03-29 MED ORDER — VITAMIN B-1 100 MG PO TABS: 100.0000 mg | ORAL_TABLET | Freq: Every day | ORAL | Status: DC

## 2013-03-29 MED ORDER — LORAZEPAM 1 MG PO TABS: 1.0000 mg | ORAL_TABLET | Freq: Three times a day (TID) | ORAL | Status: DC | PRN

## 2013-03-29 MED ORDER — CHLORDIAZEPOXIDE HCL 25 MG PO CAPS: 25.0000 mg | ORAL_CAPSULE | ORAL | Status: DC

## 2013-03-29 MED ORDER — SENNOSIDES-DOCUSATE SODIUM 8.6-50 MG PO TABS
1.0000 | ORAL_TABLET | Freq: Two times a day (BID) | ORAL | Status: DC
  Administered 2013-03-29 – 2013-04-01 (×7): 1 via ORAL
  Filled 2013-03-29 (×11): qty 1

## 2013-03-29 MED ORDER — ADULT MULTIVITAMIN W/MINERALS CH
1.0000 | ORAL_TABLET | Freq: Every day | ORAL | Status: DC
  Filled 2013-03-29 (×5): qty 1

## 2013-03-29 MED ORDER — CHLORDIAZEPOXIDE HCL 25 MG PO CAPS
25.0000 mg | ORAL_CAPSULE | Freq: Four times a day (QID) | ORAL | Status: DC
  Administered 2013-03-29: 25 mg via ORAL
  Filled 2013-03-29: qty 1

## 2013-03-29 MED ORDER — ALUM & MAG HYDROXIDE-SIMETH 200-200-20 MG/5ML PO SUSP: 30.0000 mL | ORAL | Status: DC | PRN

## 2013-03-29 MED ORDER — CHLORDIAZEPOXIDE HCL 25 MG PO CAPS: 25.0000 mg | ORAL_CAPSULE | Freq: Three times a day (TID) | ORAL | Status: DC

## 2013-03-29 MED ORDER — THIAMINE HCL 100 MG/ML IJ SOLN: 100.0000 mg | Freq: Once | INTRAMUSCULAR | Status: DC

## 2013-03-29 MED ORDER — FERROUS SULFATE 325 (65 FE) MG PO TABS
325.0000 mg | ORAL_TABLET | Freq: Three times a day (TID) | ORAL | Status: DC
  Administered 2013-03-30: 325 mg via ORAL
  Filled 2013-03-29 (×13): qty 1

## 2013-03-29 MED ORDER — CLONAZEPAM 1 MG PO TABS
1.0000 mg | ORAL_TABLET | Freq: Two times a day (BID) | ORAL | Status: DC
  Administered 2013-03-29: 1 mg via ORAL
  Filled 2013-03-29: qty 1

## 2013-03-29 MED ORDER — ADULT MULTIVITAMIN W/MINERALS CH
1.0000 | ORAL_TABLET | Freq: Every day | ORAL | Status: DC
  Filled 2013-03-29: qty 1

## 2013-03-29 MED ORDER — ZOLPIDEM TARTRATE 5 MG PO TABS: 5.0000 mg | ORAL_TABLET | Freq: Every evening | ORAL | Status: DC | PRN

## 2013-03-29 NOTE — Progress Notes (Signed)
Voluntary admission for a 47 y.o. Female with flat affect and depressed mood.  Pt. Reports that she has been drinking Listerine daily for several weeks and cocaine use for years.  Pt. Is homeless and is requesting detox.  Reports "I don't want to live if I can't stop drinking."   Pt. Denies SI/HI and denies Auditory hallucinations but reports seeing water which she believes was not there.  Pt.  Contracts for safety.  Pt. Given fluids and oriented to unit.

## 2013-03-29 NOTE — Tx Team (Signed)
Initial Interdisciplinary Treatment Plan  PATIENT STRENGTHS: (choose at least two) Ability for insight Average or above average intelligence Motivation for treatment/growth  PATIENT STRESSORS: Financial difficulties Substance abuse   PROBLEM LIST: Problem List/Patient Goals Date to be addressed Date deferred Reason deferred Estimated date of resolution  Substance Abuse 03/29/13     "find Treatment and get medications adjusted" 03/29/13     Depression 03/29/13                                          DISCHARGE CRITERIA:  Adequate post-discharge living arrangements Improved stabilization in mood, thinking, and/or behavior Need for constant or close observation no longer present Reduction of life-threatening or endangering symptoms to within safe limits Verbal commitment to aftercare and medication compliance Withdrawal symptoms are absent or subacute and managed without 24-hour nursing intervention  PRELIMINARY DISCHARGE PLAN: Attend 12-step recovery group Placement in alternative living arrangements  PATIENT/FAMIILY INVOLVEMENT: This treatment plan has been presented to and reviewed with the patient, Jacqueline [name], and/or family member The patient and family have been given the opportunity to ask questions and make suggestions.  Jacqueline [name] 03/29/2013, 10:12 PM

## 2013-03-29 NOTE — BH Assessment (Signed)
[facility] Assessment Progress Note   Pt has been accepted to [facility] by Alvy Beal NP to the services of Dr. Dub Mikes to bed 302-1.  When bed is available [facility] will coordinate through [facility].

## 2013-03-29 NOTE — ED Notes (Signed)
Sister in law called and requested to speak to pt. She was asleep and had just been medicated. RN didn't wake up the pt and needed permission from pt to speak to sister in law named Joni Reining at ph # [phone]. RN didn't give any information to sister in law. Need pt's permission to speak with sister in law. Staff took sister in laws ph # and advised her when the pt awakens that RN would give her the message.

## 2013-03-29 NOTE — BH Assessment (Signed)
Assessment Note   Jacqueline [name] is an 47 y.o. female.   Pt was in ED from [facility] last weekend and sent back per pt request.  Pt in ED today due to wanting help with alcoholism and depression.  Pt using Listerine mouth wash daily and consuming the whole bottle.  Pt also consumes some amounts of alcohol but her drink of choice is Listerine.  Pt denies HI and AVH.  Pt reports desire to die by walking into traffic or drug overdose.  Pt has past hx of attempts.  Pt reports were vague but reported 4 previous attempts.  Pt reported "If I leave her I will kill myself."    Pt appears to be in withdraws.  CIWA is a 10.  Pt clutching her belly, reports nausea, chills, appears to have perspiration on her forehead, irritable.  Pt Ox3, speech clear, but loud, irritable but cooperative, restless, legs twitching, poor eye contact, judgement impaired, panic attacks reported, 3 hrs of sleep per night last few days, no weight loss reported.  Recommend pt for 300 hall bed at [facility] due to alcoholism.  Pt reports and desire to end life are directly related to SA issues.    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 Peptic ulcer   . Alcohol abuse   . Anemia   . Benzodiazepine abuse   . Depression   . Anxiety   . Thyroid disease   . Alcoholism   . Narcotic abuse   . Back pain   . Thrombocytopenia 06/17/2011  . Hypothyroidism   . Seizures    Axis IV: housing problems, other psychosocial or environmental problems, problems related to social environment and problems with primary support group Axis V: 41-50 serious symptoms  Past Medical History:  Past Medical History  Diagnosis Date  . Peptic ulcer   . Alcohol abuse   . Anemia   . Benzodiazepine abuse   . Depression   . Anxiety   . Thyroid disease   . Alcoholism   . Narcotic abuse   . Back pain   . Thrombocytopenia 06/17/2011  . Hypothyroidism   . Seizures      Past Surgical History  Procedure Laterality Date  . Cholecystectomy    . Abdominal surgery    . Esophagogastroduodenoscopy    . Gastric bypass    . Tubal ligation    . Esophagogastroduodenoscopy N/A 03/23/2013    Procedure: ESOPHAGOGASTRODUODENOSCOPY (EGD);  Surgeon: Meryl Dare, MD;  Location: [facility];  Service: Endoscopy;  Laterality: N/A;    Family History:  Family History  Problem Relation Age of Onset  . Alcohol abuse Father   . Alcoholism Father   . Cancer Other     Social History:  reports that she has been smoking Cigarettes.  She has a 12 pack-year smoking history. She has never used smokeless tobacco. She reports that  drinks alcohol. She reports that she uses illicit drugs (Oxycodone and Cocaine).  Additional Social History:  Alcohol / Drug Use Pain Medications: na Prescriptions: na Over the Counter: na History of alcohol / drug use?: Yes Substance #1 Name of Substance 1: alcohol 1 - Age of First Use: teen 1 - Amount (size/oz): currently using listerine by choice daily 1 - Frequency: daily 1 - Duration: 1 week 1 - Last Use / Amount: 03-29-13  CIWA: CIWA-Ar BP: 101/64 mmHg Pulse Rate: 109 Nausea and Vomiting: 2  Tactile Disturbances: very mild itching, pins and needles, burning or numbness Tremor: not visible, but can be felt fingertip to fingertip Auditory Disturbances: not present Paroxysmal Sweats: barely perceptible sweating, palms moist Visual Disturbances: very mild sensitivity Anxiety: two Headache, Fullness in Head: very mild Agitation: somewhat more than normal activity Orientation and Clouding of Sensorium: oriented and can do serial additions CIWA-Ar Total: 10 COWS:    Allergies:  Allergies  Allergen Reactions  . Nsaids Other (See Comments)    G.I. Bleed    Home Medications:  (Not in a hospital admission)  OB/GYN Status:  Patient's last menstrual period was 02/10/2013.  General Assessment Data [facility] of Assessment: [facility]  ED Living Arrangements: Alone Can pt return to current living arrangement?: Yes Admission Status: Voluntary Is patient capable of signing voluntary admission?: Yes Transfer from: Acute Hospital Referral Source: MD  Education Status Is patient currently in school?: No  Risk to self Suicidal Ideation: Yes-Currently Present Suicidal Intent: Yes-Currently Present Is patient at risk for suicide?: Yes Suicidal Plan?: Yes-Currently Present Specify Current Suicidal Plan: OD or walk into traffic Access to Means: Yes Specify Access to Suicidal Means: has access to pills and use of legs What has been your use of drugs/alcohol within the last 12 months?: alcohol Previous Attempts/Gestures: Yes How many times?: 4 Other Self Harm Risks: na Triggers for Past Attempts: None known Intentional Self Injurious Behavior: None Family Suicide History: No Recent stressful life event(s): Financial Problems;Other (Comment) (SA issues and housing) Persecutory voices/beliefs?: No Depression: Yes Depression Symptoms: Insomnia;Tearfulness;Isolating;Fatigue;Guilt;Loss of interest in usual pleasures;Feeling worthless/self pity;Feeling angry/irritable Substance abuse history and/or treatment for substance abuse?: Yes Suicide prevention information given to non-admitted patients: Not applicable  Risk to Others Homicidal Ideation: No Thoughts of Harm to Others: No Current Homicidal Intent: No Current Homicidal Plan: No Access to Homicidal Means: No Identified Victim: na History of harm to others?: No Assessment of Violence: None Noted Violent Behavior Description: cooperative but irritable Does patient have access to weapons?: No Criminal Charges Pending?: No Does patient have a court date: No  Psychosis Hallucinations: None noted Delusions: None noted  Mental Status Report Appear/Hygiene: Disheveled;Body odor Eye Contact: Poor Motor Activity: Restlessness Speech: Logical/coherent;Loud Level of  Consciousness: Alert;Irritable Mood: Depressed;Anxious;Ashamed/humiliated;Irritable;Sad;Worthless, low self-esteem Affect: Anxious;Depressed;Irritable;Sad Anxiety Level: Panic Attacks Panic attack frequency: daily Most recent panic attack: 03-29-13 Thought Processes: Coherent Judgement: Impaired Orientation: Person;Time;Appropriate for developmental age Obsessive Compulsive Thoughts/Behaviors: None  Cognitive Functioning Concentration: Decreased Memory: Recent Intact;Remote Intact IQ: Average Insight: Poor Impulse Control: Poor Appetite: Fair Weight Loss: 0 Weight Gain: 0 Sleep: Decreased Total Hours of Sleep: 3 Vegetative Symptoms: None  ADLScreening [facility] Assessment Services) Patient's cognitive ability adequate to safely complete daily activities?: Yes Patient able to express need for assistance with ADLs?: Yes Independently performs ADLs?: Yes (appropriate for developmental age)  Abuse/Neglect [facility]) Physical Abuse: Denies Verbal Abuse: Denies Sexual Abuse: Denies  Prior Inpatient Therapy Prior Inpatient Therapy: Yes Prior Therapy Dates: 2013 Prior Therapy Facilty/Provider(s): [facility] Reason for Treatment: depression  Prior Outpatient Therapy Prior Outpatient Therapy: Yes Prior Therapy Dates: 2013 Prior Therapy Facilty/Provider(s): [facility] Reason for Treatment: alcohol  ADL Screening (condition at time of admission) Patient's cognitive ability adequate to safely complete daily activities?: Yes Patient able to express need for assistance with ADLs?: Yes Independently performs ADLs?: Yes (appropriate for developmental age) Weakness of Legs: None Weakness of Arms/Hands: None  Home Assistive Devices/Equipment Home Assistive Devices/Equipment: None  Therapy Consults (therapy consults require a physician order) PT Evaluation Needed: No OT Evalulation  Needed: No SLP Evaluation Needed: No Abuse/Neglect Assessment (Assessment to be complete while patient is alone) Physical  Abuse: Denies Verbal Abuse: Denies Sexual Abuse: Denies Values / Beliefs Cultural Requests During Hospitalization: None Spiritual Requests During Hospitalization: None   Advance Directives (For Healthcare) Advance Directive: Patient does not have advance directive Pre-existing out of facility DNR order (yellow form or pink MOST form): No    Additional Information 1:1 In Past 12 Months?: No CIRT Risk: No Elopement Risk: No Does patient have medical clearance?: Yes     Disposition: Recommend [facility] to review.  Pt insist she need a long term care facility.  [facility] can coordinate if pt accepted.   Disposition Initial Assessment Completed for this Encounter: Yes Disposition of Patient: Inpatient treatment program Type of inpatient treatment program: Adult  On Site Evaluation by:   Reviewed with Physician:     Titus Mould, Eppie Gibson 03/29/2013 2:43 PM

## 2013-03-29 NOTE — ED Notes (Signed)
[facility] has been accepted at [facility] and will go to room 302-1 per the ACT team.

## 2013-03-29 NOTE — ED Notes (Signed)
Per EMS- Patient was drinking mouthwash outside of a [employer] this AM. Patient states she is suicidal. Patient states her plan is to stab herself and walk out in front of a truck.  Patient denies HI, visual or auditory hallucinations.

## 2013-03-29 NOTE — BH Assessment (Signed)
[facility] Assessment Progress Note    Pt verbalizes inability to maintain sobriety outside of tx.  Pt asking about [facility] which supposedly has a longer stay option for pt.  [facility] only accepts referrals M-F.    Pt will be evaluated to determine if she needs [facility] from ED or can she go to [facility] and then transfer to [facility] or some other rehab facility.

## 2013-03-29 NOTE — Consult Note (Signed)
Reason for Consult:  Evaluation inpatient treatment Referring Physician: EDP  Jacqueline [name] is an 47 y.o. female.  HPI: Patient present to [facility] via EMS.  Patient states that she drinks a bottle o mouthwash daily. Patient has long history of alcoholism, depression and anxiety. Patient states that she wants alcohol detox. Patient denied other substance abuse at this time and states that she is SI thoughts without plan.  Patient states "I have got to make a change.  I can't keep going like I am drinking Listerine everyday."  Patient states that she is having SI thoughts with plans to "walk in front of big truck.".  Patient also has a previous history of overdose (valium).  Patient most recent visit to [facility] was 12/2011 and 02/2012.  Past Medical History  Diagnosis Date  . Peptic ulcer   . Alcohol abuse   . Anemia   . Benzodiazepine abuse   . Depression   . Anxiety   . Thyroid disease   . Alcoholism   . Narcotic abuse   . Back pain   . Thrombocytopenia 06/17/2011  . Hypothyroidism   . Seizures     Past Surgical History  Procedure Laterality Date  . Cholecystectomy    . Abdominal surgery    . Esophagogastroduodenoscopy    . Gastric bypass    . Tubal ligation    . Esophagogastroduodenoscopy N/A 03/23/2013    Procedure: ESOPHAGOGASTRODUODENOSCOPY (EGD);  Surgeon: Meryl Dare, MD;  Location: [facility];  Service: Endoscopy;  Laterality: N/A;    Family History  Problem Relation Age of Onset  . Alcohol abuse Father   . Alcoholism Father   . Cancer Other     Social History:  reports that she has been smoking Cigarettes.  She has a 12 pack-year smoking history. She has never used smokeless tobacco. She reports that  drinks alcohol. She reports that she uses illicit drugs (Oxycodone and Cocaine).  Allergies:  Allergies  Allergen Reactions  . Nsaids Other (See Comments)    G.I. Bleed    Medications: I have reviewed the patient's current medications.  Results for orders  placed during the hospital encounter of 03/29/13 (from the past 48 hour(s))  CBC WITH DIFFERENTIAL     Status: Abnormal   Collection Time    03/29/13 11:43 AM      Result Value Range   WBC 7.2  4.0 - 10.5 K/uL   RBC 4.15  3.87 - 5.11 MIL/uL   Hemoglobin 10.4 (*) 12.0 - 15.0 g/dL   HCT 40.9 (*) 81.1 - 91.4 %   MCV 81.0  78.0 - 100.0 fL   MCH 25.1 (*) 26.0 - 34.0 pg   MCHC 31.0  30.0 - 36.0 g/dL   RDW 78.2 (*) 95.6 - 21.3 %   Platelets 396  150 - 400 K/uL   Neutrophils Relative % 61  43 - 77 %   Neutro Abs 4.4  1.7 - 7.7 K/uL   Lymphocytes Relative 26  12 - 46 %   Lymphs Abs 1.8  0.7 - 4.0 K/uL   Monocytes Relative 10  3 - 12 %   Monocytes Absolute 0.7  0.1 - 1.0 K/uL   Eosinophils Relative 2  0 - 5 %   Eosinophils Absolute 0.1  0.0 - 0.7 K/uL   Basophils Relative 1  0 - 1 %   Basophils Absolute 0.1  0.0 - 0.1 K/uL  COMPREHENSIVE METABOLIC PANEL     Status: Abnormal  Collection Time    03/29/13 11:43 AM      Result Value Range   Sodium 138  135 - 145 mEq/L   Potassium 3.5  3.5 - 5.1 mEq/L   Chloride 100  96 - 112 mEq/L   CO2 20  19 - 32 mEq/L   Glucose, Bld 105 (*) 70 - 99 mg/dL   BUN 10  6 - 23 mg/dL   Creatinine, Ser 9.14  0.50 - 1.10 mg/dL   Calcium 9.0  8.4 - 78.2 mg/dL   Total Protein 8.1  6.0 - 8.3 g/dL   Albumin 4.0  3.5 - 5.2 g/dL   AST 73 (*) 0 - 37 U/L   ALT 32  0 - 35 U/L   Alkaline Phosphatase 87  39 - 117 U/L   Total Bilirubin 0.4  0.3 - 1.2 mg/dL   GFR calc non Af Amer 79 (*) >90 mL/min   GFR calc Af Amer >90  >90 mL/min   Comment:            The eGFR has been calculated     using the CKD EPI equation.     This calculation has not been     validated in all clinical     situations.     eGFR's persistently     <90 mL/min signify     possible Chronic Kidney Disease.  ETHANOL     Status: Abnormal   Collection Time    03/29/13 11:43 AM      Result Value Range   Alcohol, Ethyl (B) 362 (*) 0 - 11 mg/dL   Comment:            LOWEST DETECTABLE LIMIT FOR      SERUM ALCOHOL IS 11 mg/dL     FOR MEDICAL PURPOSES ONLY  SALICYLATE LEVEL     Status: Abnormal   Collection Time    03/29/13 11:43 AM      Result Value Range   Salicylate Lvl 2.0 (*) 2.8 - 20.0 mg/dL  ACETAMINOPHEN LEVEL     Status: None   Collection Time    03/29/13 11:43 AM      Result Value Range   Acetaminophen (Tylenol), Serum <15.0  10 - 30 ug/mL   Comment:            THERAPEUTIC CONCENTRATIONS VARY     SIGNIFICANTLY. A RANGE OF 10-30     ug/mL MAY BE AN EFFECTIVE     CONCENTRATION FOR MANY PATIENTS.     HOWEVER, SOME ARE BEST TREATED     AT CONCENTRATIONS OUTSIDE THIS     RANGE.     ACETAMINOPHEN CONCENTRATIONS     >150 ug/mL AT 4 HOURS AFTER     INGESTION AND >50 ug/mL AT 12     HOURS AFTER INGESTION ARE     OFTEN ASSOCIATED WITH TOXIC     REACTIONS.    No results found.  Review of Systems  Gastrointestinal: Positive for nausea and vomiting.  Neurological: Positive for tremors and seizures (History with withdrawal).   Blood pressure 111/77, pulse 113, temperature 98.4 F (36.9 C), temperature source Oral, resp. rate 18, height 5\' 9"  (1.753 m), weight 86.183 kg (190 lb), last menstrual period 02/10/2013, SpO2 97.00%. Physical Exam  Constitutional: She is oriented to person, place, and time. She appears well-developed.  HENT:  Head: Normocephalic.  Eyes: Pupils are equal, round, and reactive to light.  Neck: Normal range of motion.  Respiratory: Effort normal.  Musculoskeletal: Normal range of motion.  Neurological: She is alert and oriented to person, place, and time.  Skin: Skin is warm and dry.  Psychiatric: Her speech is normal and behavior is normal. Her mood appears anxious. Thought content is not paranoid and not delusional. She exhibits a depressed mood. She expresses suicidal ideation. She expresses no homicidal ideation. She expresses suicidal plans. She exhibits normal recent memory and normal remote memory.    Assessment/Plan:   Alcohol  dependence  Alcohol withdrawal  Substance induced mood disorder   Recommendation:  Inpatient treatment 1. Admit for crisis management and stabilization.  2. Review and initiate  medications pertinent to patient illness and treatment.   Start Librium Protocol 3. Medication management to reduce current symptoms to base line and improve the         patient's overall level of functioning.  Rankin, Shuvon, FNP-BC 03/29/2013, 3:03 PM    Patient was personally seen, case discussed in detail physician extender. Reviewed the information documented and agree with the treatment plan.   Yolunda Kloos,JANARDHAHA R. 03/30/2013 7:03 PM

## 2013-03-29 NOTE — ED Notes (Signed)
This pt was found in front of the [employer] store intoxicated and ems was called. ciwa is at 8. She denied any si/hi/av presently. Prior to adm in the psych ed she was suicidal with a plan to stab herself or walk into traffic. She has had previous suicide attempts, she stated. Just as a precaution rn had pt contract for si. She had a headache on adm to psych ed and was given tylenol. She refused he mv and her iron medications, but all other medications were taken. She has been in this facility before.

## 2013-03-29 NOTE — ED Provider Notes (Signed)
History     CSN: [id number]  Arrival date & time 03/29/13  1050   First MD Initiated Contact with Patient 03/29/13 1119      Chief Complaint  Patient presents with  . Medical Clearance  . Alcohol Intoxication    (Consider location/radiation/quality/duration/timing/severity/associated sxs/prior treatment) HPI Comments: Patient is a 47 year old female with a past medical history of alcoholism, depression, anxiety, and narcotic abuse who presents wanting alcohol detox. Patient was found by EMS outside of a [employer] store she reports she drank 1 liter of mouthwash which she does daily. Patient reports wanting to stop drinking but "she can't stop." Patient denies any other substance abuse at this time. Patient states she is suicidal but denies HI. Patient reports she will walk out in front of a truck.   Patient is a 47 y.o. female presenting with intoxication.  Alcohol Intoxication    Past Medical History  Diagnosis Date  . Peptic ulcer   . Alcohol abuse   . Anemia   . Benzodiazepine abuse   . Depression   . Anxiety   . Thyroid disease   . Alcoholism   . Narcotic abuse   . Back pain   . Thrombocytopenia 06/17/2011  . Hypothyroidism   . Seizures     Past Surgical History  Procedure Laterality Date  . Cholecystectomy    . Abdominal surgery    . Esophagogastroduodenoscopy    . Gastric bypass    . Tubal ligation    . Esophagogastroduodenoscopy N/A 03/23/2013    Procedure: ESOPHAGOGASTRODUODENOSCOPY (EGD);  Surgeon: Meryl Dare, MD;  Location: [facility];  Service: Endoscopy;  Laterality: N/A;    Family History  Problem Relation Age of Onset  . Alcohol abuse Father   . Alcoholism Father   . Cancer Other     History  Substance Use Topics  . Smoking status: Current Every Day Smoker -- 1.00 packs/day for 12 years    Types: Cigarettes    Last Attempt to Quit: 08/08/2011  . Smokeless tobacco: Never Used  . Alcohol Use: Yes     Comment: drink 4 liters a day. 1  case/day    OB History   Grav Para Term Preterm Abortions TAB SAB Ect Mult Living   3 3  3      3       Review of Systems  Psychiatric/Behavioral: Positive for suicidal ideas and dysphoric mood.       Substance abuse  All other systems reviewed and are negative.    Allergies  Nsaids  Home Medications   Current Outpatient Rx  Name  Route  Sig  Dispense  Refill  . clonazePAM (KLONOPIN) 1 MG tablet   Oral   Take 1 tablet (1 mg total) by mouth 2 (two) times daily.   30 tablet   0   . ferrous sulfate 325 (65 FE) MG tablet   Oral   Take 1 tablet (325 mg total) by mouth 3 (three) times daily with meals.   90 tablet   1   . levothyroxine (SYNTHROID, LEVOTHROID) 125 MCG tablet   Oral   Take 1 tablet (125 mcg total) by mouth daily before breakfast.   30 tablet   0   . Multiple Vitamin (MULTIVITAMIN WITH MINERALS) TABS   Oral   Take 1 tablet by mouth daily.   30 tablet   0   . omeprazole (PRILOSEC) 20 MG capsule   Oral   Take 20 mg by mouth 2 (  two) times daily.         Marland Kitchen senna-docusate (SENOKOT-S) 8.6-50 MG per tablet   Oral   Take 1 tablet by mouth 2 (two) times daily.   60 tablet   0   . sertraline (ZOLOFT) 100 MG tablet   Oral   Take 100 mg by mouth every morning.           BP 119/73  Pulse 93  Temp(Src) 99.4 F (37.4 C) (Oral)  Resp 16  SpO2 94%  LMP 02/10/2013  Physical Exam  Nursing note and vitals reviewed. Constitutional: She is oriented to person, place, and time. She appears well-developed and well-nourished. No distress.  HENT:  Head: Normocephalic and atraumatic.  Eyes: Conjunctivae are normal.  Neck: Normal range of motion.  Cardiovascular: Normal rate and regular rhythm.  Exam reveals no gallop and no friction rub.   No murmur heard. Pulmonary/Chest: Effort normal and breath sounds normal. She has no wheezes. She has no rales. She exhibits no tenderness.  Abdominal: Soft. She exhibits no distension. There is no tenderness. There  is no rebound and no guarding.  Musculoskeletal: Normal range of motion.  Neurological: She is alert and oriented to person, place, and time.  Speech is goal-oriented. Moves limbs without ataxia.   Skin: Skin is warm and dry.  Psychiatric:  Dysphoric mood.     ED Course  Procedures (including critical care time)  Labs Reviewed  CBC WITH DIFFERENTIAL - Abnormal; Notable for the following:    Hemoglobin 10.4 (*)    HCT 33.6 (*)    MCH 25.1 (*)    RDW 17.3 (*)    All other components within normal limits  COMPREHENSIVE METABOLIC PANEL - Abnormal; Notable for the following:    Glucose, Bld 105 (*)    AST 73 (*)    GFR calc non Af Amer 79 (*)    All other components within normal limits  URINE RAPID DRUG SCREEN (HOSP PERFORMED) - Abnormal; Notable for the following:    Cocaine POSITIVE (*)    All other components within normal limits  ETHANOL - Abnormal; Notable for the following:    Alcohol, Ethyl (B) 362 (*)    All other components within normal limits  URINALYSIS, ROUTINE W REFLEX MICROSCOPIC - Abnormal; Notable for the following:    APPearance CLOUDY (*)    Hgb urine dipstick TRACE (*)    Leukocytes, UA SMALL (*)    All other components within normal limits  SALICYLATE LEVEL - Abnormal; Notable for the following:    Salicylate Lvl 2.0 (*)    All other components within normal limits  URINE MICROSCOPIC-ADD ON - Abnormal; Notable for the following:    Bacteria, UA MANY (*)    All other components within normal limits  URINE CULTURE  ACETAMINOPHEN LEVEL   No results found.   1. Anxiety disorder   2. Depression   3. Polysubstance abuse   4. S/P alcohol detoxification       MDM  1:31 PM Labs pending. Patient will be evaluated by ACT.         Emilia Beck, PA-C 03/29/13 2011

## 2013-03-30 DIAGNOSIS — F1020 Alcohol dependence, uncomplicated: Secondary | ICD-10-CM

## 2013-03-30 DIAGNOSIS — F1994 Other psychoactive substance use, unspecified with psychoactive substance-induced mood disorder: Secondary | ICD-10-CM

## 2013-03-30 MED ORDER — NICOTINE 21 MG/24HR TD PT24
21.0000 mg | MEDICATED_PATCH | Freq: Every day | TRANSDERMAL | Status: DC
  Administered 2013-03-31 – 2013-04-02 (×3): 21 mg via TRANSDERMAL
  Filled 2013-03-30: qty 14
  Filled 2013-03-30 (×3): qty 1

## 2013-03-30 MED ORDER — PANTOPRAZOLE SODIUM 40 MG PO TBEC
40.0000 mg | DELAYED_RELEASE_TABLET | Freq: Every day | ORAL | Status: DC
  Administered 2013-03-30 – 2013-04-01 (×3): 40 mg via ORAL
  Filled 2013-03-30 (×6): qty 1

## 2013-03-30 MED ORDER — GABAPENTIN 100 MG PO CAPS
200.0000 mg | ORAL_CAPSULE | Freq: Three times a day (TID) | ORAL | Status: DC
  Administered 2013-03-30 – 2013-04-01 (×7): 200 mg via ORAL
  Filled 2013-03-30 (×8): qty 2
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
## 2013-03-30 NOTE — [facility] Group Notes (Signed)
[facility] LCSW Group Therapy  03/30/2013 2:01 PM  Type of Therapy:  Group Therapy  Participation Level:  Did Not Attend  Smart, Herbert Seta 03/30/2013, 2:01 PM
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
## 2013-03-30 NOTE — [facility] Counselor (Signed)
Adult Psychosocial Assessment Update Interdisciplinary Team  [facility] admissions/discharges:  Admissions   Date: 03/29/13 Date:  Date: 12/22/12 Date:  Date: 02/21/12 Date:  Date: 12/18/11 Date:  Date: Date:   Changes since the last Psychosocial Assessment (including adherence to outpatient mental health and/or substance abuse treatment, situational issues contributing to decompensation and/or relapse). Pt reports that she stopped taking anti depressant meds about four months ago due to increase in alcohol consumption. She began drinking Listerine to deal with depressive symptoms and has been experiencing SI with plan over the past several weeks. Pt living at the [facility].              Discharge Plan 1. Will you be returning to the same living situation after discharge?   Yes: No:      If no, what is your plan?    As of now, pt reports that she will be returning to [facility] after discharge.        2. Would you like a referral for services when you are discharged? Yes:     If yes, for what services?  No:       Patient working with ACT services--unclear which ACT team she is working with at this time. Pt will most likely follow up at [facility] for med management and continue working with this ACT team. CSW to explore if pt is interested in longer term i/p rehab.        Summary and Recommendations (to be completed by the evaluator) Pt is 47 year old female living at the [facility] in [place]. She was admitted to the hospital due to severe withdrawal symptoms, detox, SI with plan, and depression. Pt has had 3 prior admissions to [facility]. Recommendations for pt include therapeutic milieu, encourage group attendance and participation, librium taper for ETOH detox, medication management for mood stabilization, and development of comprehensive mental wellness/sobriety plan.                        Signature:  Trula Slade, 03/30/2013 3:23 PM

## 2013-03-30 NOTE — Tx Team (Signed)
Interdisciplinary Treatment Plan Update (Adult)  Date: 03/30/2013   Time Reviewed: 3:15 PM  Progress in Treatment:  Attending groups: No. Participating in groups:  No. Taking medication as prescribed: Yes  Tolerating medication: Yes  Family/Significant othe contact made: Not yet. SPE needed.  Patient understands diagnosis: Yes, AEB seeking treatment for substance abuse, SI, detox, and depression.  Discussing patient identified problems/goals with staff: Yes  Medical problems stabilized or resolved: Yes  Denies suicidal/homicidal ideation: No. Passive SI.  Patient has not harmed self or Others: Yes  New problem(s) identified: n/a  Discharge Plan or Barriers: Pt currently unable to attend d/c planning due to severe withdrawal symptoms. Options for aftercare will continue to be explored between pt and CSW.  Additional comments: Jacqueline [name] is an 47 y.o. female.  Pt was in ED from [facility] last weekend and sent back per pt request. Pt in ED today due to wanting help with alcoholism and depression. Pt using Listerine mouth wash daily and consuming the whole bottle. Pt also consumes some amounts of alcohol but her drink of choice is Listerine.  Pt denies HI and AVH. Pt reports desire to die by walking into traffic or drug overdose. Pt has past hx of attempts. Pt reports were vague but reported 4 previous attempts. Pt reported "If I leave her I will kill myself."  Pt appears to be in withdraws. CIWA is a 10. Pt clutching her belly, reports nausea, chills, appears to have perspiration on her forehead, irritable. Pt Ox3, speech clear, but loud, irritable but cooperative, restless, legs twitching, poor eye contact, judgement impaired, panic attacks reported, 3 hrs of sleep per night last few days, no weight loss reported.  Recommend pt for 300 hall bed at [facility] due to alcoholism. Pt reports and desire to end life are directly related to SA issues.  Reason for Continuation of  Hospitalization: SI Medication management for mood stabilization Librium taper/detox Estimated length of stay: 3-5 days For review of initial/current patient goals, please see plan of care.  Attendees:  Patient:    Family:    Physician: Aggie PA 03/30/2013 3:18 PM   Nursing: Maureen Ralphs RN 03/30/2013 3:18 PM   Clinical Social Worker Bailea Beed Smart, LCSWA  03/30/2013 3:18 PM   Other: Lupita Leash RN 03/30/2013 3:18 PM   Other:    Other:    Other:    Scribe for Treatment Team:  Trula Slade LCSWA 03/30/2013 3:18 PM

## 2013-03-30 NOTE — Progress Notes (Signed)
Psychoeducational Group Note  Date:  03/30/2013 Time:  1100  Group Topic/Focus:  Wellness Toolbox:   The focus of this group is to discuss various aspects of wellness, balancing those aspects and exploring ways to increase the ability to experience wellness.  Patients will create a wellness toolbox for use upon discharge.  Participation Level: Did Not Attend  Participation Quality:  Not Applicable  Affect:  Not Applicable  Cognitive:  Not Applicable  Insight:  Not Applicable  Engagement in Group: Not Applicable  Additional Comments:  Pt did not attend group.  Sharyn Lull 03/30/2013, 11:34 AM

## 2013-03-30 NOTE — Progress Notes (Signed)
Pt has been in bed all day she has not been up for groups or meals today.  She did have lunch and dinner brought back for her.  She has only ate a few bites but has been drinking gatorade. Her CIWA has been between 9-7 today.  She rated her depression and hopelessness a 9 and her anxiety a 10 on her self-inventory.  She does have a slight tremor and chilling today.  She denies any S/H ideation or A/V hallucinations.  She stated,"I feel tired and weak".  Pt refused her multiple vitamin and her noon and 1700 dose on her iron.  She c/o stomach pain  NP aware and she ordered pt protonix.  Pt wants to go to long-term care from here.  She did receive tylenol at 1413 for headache and she reported relief.

## 2013-03-30 NOTE — [facility] Group Notes (Signed)
[facility] LCSW Aftercare Discharge Planning Group Note   03/30/2013 9:18 AM  Participation Quality:  DID NOT ATTEND    Smart, Lebron Quam

## 2013-03-30 NOTE — Progress Notes (Signed)
Pt observed in her room in bed with eyes closed.  Pt was asked to come to the window for her 8pm medication.  Pt reports she is still feeling sick.  She says she is having generalized pain.  Pt was given her scheduled Neurontin 200mg  which was started this afternoon.  Pt denies SI/HI/AV at this time.  Pt says [facility] through [facility].  She is interested in long term treatment.  Support and encouragement offered.  Pt makes her needs known to staff.  Safety maintained with q15 minute checks.

## 2013-03-31 DIAGNOSIS — F1910 Other psychoactive substance abuse, uncomplicated: Secondary | ICD-10-CM

## 2013-03-31 DIAGNOSIS — F1020 Alcohol dependence, uncomplicated: Principal | ICD-10-CM

## 2013-03-31 DIAGNOSIS — F1994 Other psychoactive substance use, unspecified with psychoactive substance-induced mood disorder: Secondary | ICD-10-CM

## 2013-03-31 LAB — URINE CULTURE

## 2013-03-31 MED ORDER — BUSPIRONE HCL 10 MG PO TABS
10.0000 mg | ORAL_TABLET | Freq: Two times a day (BID) | ORAL | Status: DC
  Administered 2013-03-31 – 2013-04-01 (×3): 10 mg via ORAL
  Filled 2013-03-31 (×6): qty 1

## 2013-03-31 MED ORDER — LITHIUM CARBONATE 300 MG PO CAPS
300.0000 mg | ORAL_CAPSULE | Freq: Two times a day (BID) | ORAL | Status: DC
  Administered 2013-03-31 – 2013-04-01 (×3): 300 mg via ORAL
  Filled 2013-03-31 (×6): qty 1

## 2013-03-31 MED ORDER — BUPROPION HCL ER (XL) 150 MG PO TB24
150.0000 mg | ORAL_TABLET | Freq: Every day | ORAL | Status: DC
  Administered 2013-03-31 – 2013-04-01 (×2): 150 mg via ORAL
  Filled 2013-03-31 (×5): qty 1

## 2013-03-31 NOTE — Progress Notes (Signed)
[facility] MD Progress Note  03/31/2013 2:44 PM Jacqueline [name]  MRN:  [id number]  Subjective:  Jacqueline [name] reports that he still feels depressed. Is worried about how things will workout for her after discharge. States that she is weak, gullible and can't resist alcohol. Hoping that she will be able to go to a 30 day treatment center after discharge. Says that she will not be able to stop drinking alcohol on her, after all, she has been through several other treatment centers in the past. Thinks that it will take some drastic measures to stop her alcohol abuse. She says she has this compulsion to drink. She also adds that alcohol calms her mind. She says her mind, brain and thoughts do not stop going and going".  Diagnosis:   Axis I: Substance Induced Mood Disorder and Polysubstance abuse, Alcohol dependence Axis II: Deferred Axis III:  Past Medical History  Diagnosis Date  . Peptic ulcer   . Alcohol abuse   . Anemia   . Benzodiazepine abuse   . Depression   . Anxiety   . Thyroid disease   . Alcoholism   . Narcotic abuse   . Back pain   . Thrombocytopenia 06/17/2011  . Hypothyroidism   . Seizures    Axis IV: economic problems, housing problems, occupational problems, other psychosocial or environmental problems and Substance abuse Axis V: 41-50 serious symptoms  ADL's:  Intact  Sleep: Fair  Appetite:  Fair  Suicidal Ideation:  Plan:  Denies Intent:  Denies Means:  Denies Homicidal Ideation:  Plan:  Denies Intent:  Denies Means:  Denies  AEB (as evidenced by):  Psychiatric Specialty Exam: Review of Systems  Constitutional: Negative.   HENT: Negative.   Eyes: Negative.   Respiratory: Negative.   Cardiovascular: Negative.   Gastrointestinal: Negative.   Genitourinary: Negative.   Musculoskeletal: Negative.   Skin: Negative.   Neurological: Positive for tremors.  Endo/Heme/Allergies: Negative.   Psychiatric/Behavioral: Positive for depression and substance abuse.  Negative for suicidal ideas, hallucinations and memory loss. The patient is nervous/anxious and has insomnia.     Blood pressure 117/78, pulse 83, temperature 98.2 F (36.8 C), temperature source Oral, resp. rate 16, height 5\' 9"  (1.753 m), weight 84.369 kg (186 lb), last menstrual period 02/10/2013.Body mass index is 27.45 kg/(m^2).  General Appearance: Fairly Groomed  Patent attorney::  Poor  Speech:  Clear and Coherent  Volume:  Normal  Mood:  Anxious, Depressed, Dysphoric and Irritable  Affect:  Flat  Thought Process:  Coherent and Goal Directed  Orientation:  Full (Time, Place, and Person)  Thought Content:  Rumination  Suicidal Thoughts:  Yes.  without intent/plan  Homicidal Thoughts:  No  Memory:  Immediate;   Good Recent;   Good Remote;   Good  Judgement:  Impaired  Insight:  Fair  Psychomotor Activity:  Restlessness  Concentration:  Poor  Recall:  Good  Akathisia:  No  Handed:  Right  AIMS (if indicated):     Assets:  Desire for Improvement  Sleep:  Number of Hours: 5.75   Current Medications: Current Facility-Administered Medications  Medication Dose Route Frequency Provider Last Rate Last Dose  . acetaminophen (TYLENOL) tablet 650 mg  650 mg Oral Q6H PRN Shuvon Rankin, NP   650 mg at 03/31/13 0804  . alum & mag hydroxide-simeth (MAALOX/MYLANTA) 200-200-20 MG/5ML suspension 30 mL  30 mL Oral Q4H PRN Shuvon Rankin, NP      . buPROPion (WELLBUTRIN XL) 24 hr tablet 150 mg  150 mg Oral Daily Sanjuana Kava, NP   150 mg at 03/31/13 0932  . busPIRone (BUSPAR) tablet 10 mg  10 mg Oral BID Sanjuana Kava, NP      . chlordiazePOXIDE (LIBRIUM) capsule 25 mg  25 mg Oral Q6H PRN Shuvon Rankin, NP   25 mg at 03/30/13 0604  . chlordiazePOXIDE (LIBRIUM) capsule 25 mg  25 mg Oral TID Shuvon Rankin, NP   25 mg at 03/31/13 1136   Followed by  . [START ON 04/01/2013] chlordiazePOXIDE (LIBRIUM) capsule 25 mg  25 mg Oral BH-qamhs Shuvon Rankin, NP       Followed by  . [START ON 04/03/2013]  chlordiazePOXIDE (LIBRIUM) capsule 25 mg  25 mg Oral Daily Shuvon Rankin, NP      . ferrous sulfate tablet 325 mg  325 mg Oral TID WC Shuvon Rankin, NP   325 mg at 03/30/13 0825  . gabapentin (NEURONTIN) capsule 200 mg  200 mg Oral TID Sanjuana Kava, NP   200 mg at 03/31/13 1136  . hydrOXYzine (ATARAX/VISTARIL) tablet 25 mg  25 mg Oral Q6H PRN Shuvon Rankin, NP      . hydrOXYzine (ATARAX/VISTARIL) tablet 50 mg  50 mg Oral QHS PRN [name] Spurr, PA-C   50 mg at 03/30/13 2142  . levothyroxine (SYNTHROID, LEVOTHROID) tablet 125 mcg  125 mcg Oral QAC breakfast Shuvon Rankin, NP   125 mcg at 03/31/13 1610  . lithium carbonate capsule 300 mg  300 mg Oral BID WC Sanjuana Kava, NP      . magnesium hydroxide (MILK OF MAGNESIA) suspension 30 mL  30 mL Oral Daily PRN Shuvon Rankin, NP      . multivitamin with minerals tablet 1 tablet  1 tablet Oral Daily Shuvon Rankin, NP      . nicotine (NICODERM CQ - dosed in mg/24 hours) patch 21 mg  21 mg Transdermal Q0600 Rachael Fee, MD   21 mg at 03/31/13 9604  . ondansetron (ZOFRAN) tablet 4 mg  4 mg Oral Q8H PRN Shuvon Rankin, NP      . ondansetron (ZOFRAN-ODT) disintegrating tablet 4 mg  4 mg Oral Q6H PRN Shuvon Rankin, NP   4 mg at 03/30/13 0604  . pantoprazole (PROTONIX) EC tablet 40 mg  40 mg Oral Daily Sanjuana Kava, NP   40 mg at 03/31/13 0806  . senna-docusate (Senokot-S) tablet 1 tablet  1 tablet Oral BID Shuvon Rankin, NP   1 tablet at 03/31/13 0805  . thiamine (B-1) injection 100 mg  100 mg Intramuscular Once Shuvon Rankin, NP      . thiamine (VITAMIN B-1) tablet 100 mg  100 mg Oral Daily Shuvon Rankin, NP   100 mg at 03/31/13 5409    Lab Results: No results found for this or any previous visit (from the past 48 hour(s)).  Physical Findings: AIMS:  , ,  ,  ,    CIWA:  CIWA-Ar Total: 8 COWS:  COWS Total Score: 7  Treatment Plan Summary: Daily contact with patient to assess and evaluate symptoms and progress in treatment Medication  management  Plan: Supportive approach/coping skills/relapse prevention. Initiate lithium Carbonate 300 mg bid for mood stabilization. Start Buspar 10 mg bid for anxiety. Obtain Lithium levels Encouraged out of room, participation in group sessions and application of coping skills when distressed. Will continue to monitor response to/adverse effects of medications in use to assure effectiveness. Continue to monitor mood, behavior and interaction with staff and other  patients. Continue current plan of care.  Medical Decision Making Problem Points:  Established problem, worsening (2), Review of last therapy session (1) and Review of psycho-social stressors (1) Data Points:  Review or order clinical lab tests (1) Review of medication regiment & side effects (2) Review of new medications or change in dosage (2)  I certify that inpatient services furnished can reasonably be expected to improve the patient's condition.   Sanjuana Kava, FNP, PMHNP-BC 03/31/2013, 2:44 PM

## 2013-03-31 NOTE — [facility] Group Notes (Signed)
[facility] LCSW Group Therapy  03/31/2013 1:59 PM  Type of Therapy:  Group Therapy  Participation Level:  Active  Participation Quality:  Appropriate  Affect:  Appropriate  Cognitive:  Appropriate  Insight:  Engaged  Engagement in Therapy:  Engaged  Modes of Intervention:  Clarification, Education, Exploration, Socialization and Support  Summary of Progress/Problems: MHA speaker did not come today. Today's group focused on "community resources." Patients were provided with information about the [facility] and [facility] in [place]. They were encouraged to share stories and resources that they were knowledgeable about within the community. Kallan shared her experience at the [facility] and at the [facility]. She processed how attending groups at [facility] can help her continue to remain sober and assist with building her positive social network.   Smart, Lavonn Maxcy 03/31/2013, 1:59 PM

## 2013-03-31 NOTE — Progress Notes (Signed)
Recreation Therapy Notes  Date: 06.24.2014 Time: 2:30pm Location: [address] Dayroom     Group Topic/Focus: Musician (AAA/T)  Participation Level: Active  Participation Quality: Appropriate  Affect: Euthymic  Cognitive: Appropriate  Additional Comments: 06.24.2014 Session = AAA Session; Dog Team = [facility] and handler  Patient with peers educated on search and rescue. Patient pet and visited with [place]. Patient interacted appropriately with peers, LRT and dog team.    Jearl Klinefelter, LRT/CTRS  Jearl Klinefelter 03/31/2013 4:53 PM

## 2013-03-31 NOTE — [facility] Group Notes (Signed)
[facility] LCSW Aftercare Discharge Planning Group Note   03/31/2013 10:30 AM  Participation Quality:  Appropriate   Mood/Affect:  Depressed  Depression Rating:  10  Anxiety Rating:  7  Thoughts of Suicide:  No Will you contract for safety?   Yes  Current AVH:  No  Plan for Discharge/Comments:  Pt hopes to go to [facility] but is worried about being discharged for more than a few hours. She fears that she will immediately relapse. [facility] is another possibility for this pt. Pt reports that she feels sick still but is not as bad as yesterday. She reports that she drinks listerine daily and is currently homeless in [place].   Transportation Means: bus  Supports: none identified by pt.   Smart, [employer]

## 2013-03-31 NOTE — Progress Notes (Signed)
Pt has been up and active in the milieu today.  She is attending groups and interacting with peers and staff appropriately.  While talking with pt about her self-inventory response, she did admit that she was just at [facility] 2 weeks ago and completed the program and went right back to drinking.  Encouraged pt to let Heather her case manager know so she wouldn't waste her time checking there for a bed.  She did voice understanding.  However, Herbert Seta came to this nurse and pt had actually agreed to [facility] or [facility].  Pt is wanting to go to [facility] asap.  She stated,"If I have to be out on the streets for long I would never make without drinking.  Allowed time for her to vent and then suggested that she use the coping skills she has learned from her past hospitalization and rehab.  She did voice understanding.  She rated her depression a 9 hopelessness a 8 and her anxiety a 7. Pt has some S/I no plan here and contracts to come to staff.  She stated," I will drink myself to death if released or try suicide".  She went on to say that she would never harm herself here.  Pt found out this afternoon that she has a bed at [facility] on Thursday.  She stated,"my prayers have been answered.  I was afraid I would be back in the street drinking and killing myself" She was started on Wellbutrin, lithium, buspar, and her zoloft was discontinued and she will have lithium level drawn on 04/02/13.  Pt voiced understanding regarding her medication changes.  Her CIWA has between 6-8 today.  She did request tylenol for a headache at 0804 which she reported was helpful other than that 1 prn she has not required any other medication for any symptoms of withdrawal.

## 2013-04-01 MED ORDER — HYDROXYZINE HCL 50 MG PO TABS: 50.0000 mg | ORAL_TABLET | Freq: Every evening | ORAL | Status: DC | PRN

## 2013-04-01 MED ORDER — MAGNESIUM CITRATE PO SOLN
1.0000 | Freq: Once | ORAL | Status: AC
  Administered 2013-04-01: 1 via ORAL

## 2013-04-01 MED ORDER — LITHIUM CARBONATE 300 MG PO CAPS: 300.0000 mg | ORAL_CAPSULE | Freq: Two times a day (BID) | ORAL | Status: DC

## 2013-04-01 MED ORDER — SENNOSIDES-DOCUSATE SODIUM 8.6-50 MG PO TABS: 1.0000 | ORAL_TABLET | Freq: Two times a day (BID) | ORAL | Status: DC

## 2013-04-01 MED ORDER — BUPROPION HCL ER (XL) 150 MG PO TB24: 150.0000 mg | ORAL_TABLET | Freq: Every day | ORAL | Status: DC

## 2013-04-01 MED ORDER — BUSPIRONE HCL 10 MG PO TABS: 10.0000 mg | ORAL_TABLET | Freq: Two times a day (BID) | ORAL | Status: DC

## 2013-04-01 MED ORDER — GABAPENTIN 300 MG PO CAPS
300.0000 mg | ORAL_CAPSULE | Freq: Four times a day (QID) | ORAL | Status: DC
  Administered 2013-04-01 (×2): 300 mg via ORAL
  Filled 2013-04-01 (×8): qty 1

## 2013-04-01 MED ORDER — ESOMEPRAZOLE MAGNESIUM 40 MG PO CPDR: 40.0000 mg | DELAYED_RELEASE_CAPSULE | Freq: Every day | ORAL | Status: DC

## 2013-04-01 MED ORDER — NICOTINE 21 MG/24HR TD PT24: 1.0000 | MEDICATED_PATCH | Freq: Every day | TRANSDERMAL | Status: DC

## 2013-04-01 MED ORDER — FERROUS SULFATE 325 (65 FE) MG PO TABS: 325.0000 mg | ORAL_TABLET | Freq: Three times a day (TID) | ORAL | Status: DC

## 2013-04-01 MED ORDER — LEVOTHYROXINE SODIUM 125 MCG PO TABS: 125.0000 ug | ORAL_TABLET | Freq: Every day | ORAL | Status: DC

## 2013-04-01 MED ORDER — GABAPENTIN 300 MG PO CAPS: 300.0000 mg | ORAL_CAPSULE | Freq: Four times a day (QID) | ORAL | Status: DC

## 2013-04-01 MED ORDER — ADULT MULTIVITAMIN W/MINERALS CH: 1.0000 | ORAL_TABLET | Freq: Every day | ORAL | Status: DC

## 2013-04-01 NOTE — BHH Group Notes (Signed)
[facility] LCSW Aftercare Discharge Planning Group Note   04/01/2013 9:32 AM  Participation Quality:  Appropriate   Mood/Affect:  Appropriate  Depression Rating:  8  Anxiety Rating:  5  Thoughts of Suicide:  No Will you contract for safety?   NA  Current AVH:  No  Plan for Discharge/Comments:  Pt will d/c tomorrow at 6:30am in order to catch bus that will take her to [facility] on [address]. [facility] van set to pick up pt at 8am for admission into the residential [facility] program. Pt will have meds, clothes, and ID with her. She feels confident in this plan. Pt slept poorly and asking for med changes.   Transportation Means: bus  Supports: limited/community supports.   Smart, [employer]
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
## 2013-04-01 NOTE — Progress Notes (Signed)
Adult Psychoeducational Group Note  Date:  04/01/2013 Time:  9:40 PM  Group Topic/Focus:  NA group  Participation Level:  Active  Participation Quality:  Appropriate  Affect:  Appropriate  Cognitive:  Alert  Insight: Appropriate  Engagement in Group:  Engaged  Modes of Intervention:  Discussion  Additional Comments:    Flonnie Hailstone 04/01/2013, 9:40 PM

## 2013-04-01 NOTE — Progress Notes (Signed)
Pt reports she is feeling some better this evening.  Pt says her withdrawal symptoms are decreasing.  She is worried about how she will do once she is discharged from [facility].  She is glad that she has a bed at [facility] on Thursday and that makes her more hopeful.  Pt denies SI/HI/AV, but says she has thoughts of killing herself that come and go.  Pt makes her needs known to staff.  Support and encouragement offered.  Safety maintained with q15 minute checks.

## 2013-04-01 NOTE — Progress Notes (Signed)
[facility] Adult Case Management Discharge Plan :  Will you be returning to the same living situation after discharge: No. At discharge, do you have transportation home?:Yes,  bus pass Do you have the ability to pay for your medications:Yes,  mental health  Release of information consent forms completed and in the chart;  Patient's signature needed at discharge.  Patient to Follow up at: Follow-up Information   Follow up with [facility] On 04/02/2013. (Be at [facility] by Melba Coon. [facility] will pick you up to take you to the facility. Make sure you have 30 days worth of meds and your ID. )    Contact information:   [address] [place], [place] [address] phone: [phone] fax: [phone]      Follow up with [facility]. (Walk in Monday thru Friday 8AM-9AM for hospital followup/medication managment. )    Contact information:   [address], [place] [address] phone: [phone] fax: [phone]      Patient denies SI/HI:   Yes,  in group    Safety Planning and Suicide Prevention discussed:  Yes,  pt refused to consent to family/friend contact. SPE completed with pt. Pt encouraged to ask questions or disuss concerns. SPI pamplet given to pt as well.  Smart, Yaquelin Langelier 04/01/2013, 11:35 AM

## 2013-04-01 NOTE — Progress Notes (Signed)
Pt reports she is leaving Thursday to go to [facility] and she is very anxious about that.  She is worried that she will not be there on time.  Pt denies SI/HI/AV.  Pt was assured that staff would get her up on time.  Pt has been informed of the procedure to get to the bus stop and where to meet the [facility] staff at [place] [facility] on [address].  Pt is assured that everything is ready for her discharge and she will be at the bus stop on time.  Encourage pt to try to get a restful sleep tonight and think of this as a positive adventure in her life.  Pt given her HS meds.  Pt voices no other needs/concerns at this time.  Support and encouragement offered.  Safety maintained with q15 minute checks.

## 2013-04-01 NOTE — B[facility] Group Notes (Signed)
[facility] LCSW Group Therapy  04/01/2013 2:54 PM  Type of Therapy:  Group Therapy  Participation Level:  Active  Participation Quality:  Attentive  Affect:  Appropriate  Cognitive:  Appropriate  Insight:  Engaged  Engagement in Therapy:  Engaged  Modes of Intervention:  Discussion, Education, Exploration, Socialization and Support  Summary of Progress/Problems: Emotion Regulation: This group focused on both positive and negative emotion identification and allowed group members to process ways to identify feelings, regulate negative emotions, and find healthy ways to manage internal/external emotions. Group members were asked to reflect on a time when their reaction to an emotion led to a negative outcome and explored how alternative responses using emotion regulation would have benefited them. Group members were also asked to discuss a time when emotion regulation was utilized when a negative emotion was experienced. Jacqueline [name] identified shame, guilt, and hoplessness as primary negative emotions experienced lately. She explored how these feelings affect her physically and mentally by describing them to the group. Jacqueline [name] processed how making time for herself, focusing on her recovery/sobriety, and following through with i/p treatment at [facility] will help her better regulate her emotions, handle stress, and remain sober. She explained that communication and follow through with goals will be her main focus during recovery.    [name], Jacqueline Kerrigan 04/01/2013, 2:54 PM

## 2013-04-01 NOTE — ED Provider Notes (Signed)
Medical screening examination/treatment/procedure(s) were performed by non-physician practitioner and as supervising physician I was immediately available for consultation/collaboration.   Antwan Bribiesca H Jannetta Massey, MD 04/01/13 0658 

## 2013-04-01 NOTE — Progress Notes (Signed)
Adult Psychoeducational Group Note  Date:  04/01/2013 Time:  12:53 PM  Group Topic/Focus:  Personal Choices and Values:   The focus of this group is to help patients assess and explore the importance of values in their lives, how their values affect their decisions, how they express their values and what opposes their expression.  Participation Level:  Minimal  Participation Quality:  Attentive  Affect:  Appropriate  Cognitive:  Appropriate  Insight: Appropriate  Engagement in Group:  Engaged  Modes of Intervention:  Discussion, Education and Exploration  Additional Comments:  Pt expresses being anxious about the bus arrangements following discharge tomorrow and making it to [facility]. Pt shared that she "thinks about drinking all the time".  Reynolds Bowl 04/01/2013, 12:53 PM

## 2013-04-01 NOTE — B[facility] Suicide Risk Assessment (Signed)
[facility] INPATIENT:  Family/Significant Other Suicide Prevention Education  Suicide Prevention Education:  Patient Refusal for Family/Significant Other Suicide Prevention Education: The patient Jacqueline [name] has refused to provide written consent for family/significant other to be provided Family/Significant Other Suicide Prevention Education during admission and/or prior to discharge.  Physician notified.  SPE completed with pt. Pt encouraged to ask questions and discuss any concerns regarding suicide prevention. Pt provided with SPI pamphlet.   Smart, Zunairah Devers 04/01/2013, 11:37 AM

## 2013-04-01 NOTE — Discharge Summary (Signed)
Physician Discharge Summary Note  Patient:  Jacqueline [name] is an 47 y.o., female MRN:  [id number] DOB:  1966-07-24 Patient phone:  [phone] (home)  Patient address:   [address] [place] [address],   Date of Admission:  03/29/2013 Date of Discharge: 04/01/13  Reason for Admission:  Alcohol intoxication  Discharge Diagnoses: Principal Problem:   Alcohol dependence Active Problems:   Polysubstance abuse   Cocaine abuse, episodic   Substance induced mood disorder  Review of Systems  Constitutional: Negative.   HENT: Negative.   Eyes: Negative.   Respiratory: Negative.   Cardiovascular: Negative.   Gastrointestinal: Negative.   Genitourinary: Negative.   Musculoskeletal: Negative.   Skin: Negative.   Neurological: Negative.   Endo/Heme/Allergies: Negative.   Psychiatric/Behavioral: Positive for depression (Stabilized with medication prior to discharge) and substance abuse (Alcoholism). Negative for suicidal ideas, hallucinations and memory loss. The patient is nervous/anxious (Stabilized with medication prior to discharge) and has insomnia (Stabilized with medication prior to discharge).    Axis Diagnosis:   AXIS I:  Polysubstance abuse, Alcohol dependence, Cocaine dependence, substance induced mood disorder AXIS II:  Deferred AXIS III:   Past Medical History  Diagnosis Date  . Peptic ulcer   . Alcohol abuse   . Anemia   . Benzodiazepine abuse   . Depression   . Anxiety   . Thyroid disease   . Alcoholism   . Narcotic abuse   . Back pain   . Thrombocytopenia 06/17/2011  . Hypothyroidism   . Seizures    AXIS IV:  economic problems, housing problems, occupational problems, problems with primary support group and Chronic mental illness, substance abuse AXIS V:  63  Level of Care:  [facility]  Hospital Course:  This is a 47 year old Caucasian female. Ad[facility] to [facility] from the [facility] ED with complaints of suicidal ideation with plans to walk  in front of a moving motor vehicle. Patient reports, "The ambulance took me to the hospital yesterday. I was drinking high volume of Listerine mouth wash. I was feeling sick to my stomach. I tried to walk to the hospital, I guess I passed out at the [place], people saw me and called the ambulance. I have been drinking Listerine off and on x many months. I like the alcohol in Listerine. I get depressed a lot, and drinking Listerine helps me forget my problems. I also get suicidal a lot. I was on depression medicines, but I have not been on my medicines in 4 months because I was drinking too much alcohol".  Upon admission into this hospital, and after admission assessment/evaluation coupled with UDS/Toxicology reports, it was determined that Jacqueline [name] will need detoxification treatment protocol to stabilize her system of alcohol intoxication and to combat the withdrawal symptoms of this substance as well.  She was then started on Librium treatment protocol. She was also enrolled in group counseling sessions and activities where she learned coping skills that should help her after discharge to cope better and manage her substance abuse issues to sustain a much longer sobriety. She also attended AA/NA meetings being offered and held on this unit. She has some previously existing and or identifiable medical conditions that required treatment and or monitoring. She received medication management for all those health issues as well. She was monitored closely for any potential problems that may arise as a result of and or during detoxification treatment. Patient tolerated her treatment regimen and detoxification treatment protocol without any  significant adverse effects and or reactions presented.  Patient attended treatment team meeting this am and met with the treatment team members. Her reason for admission, present symptoms, substance abuse issues, response to treatment and discharge plans discussed. Patient endorsed  that she is doing well and stable for discharge to pursue the next phase of her substance abuse treatment. It was then agreed upon that she will continue substance abuse treatment at the [facility] on [address] in [place], [place] starting on 04/02/13 at 08:00 am. Jacqueline [name] will leave [facility] after discharge and will go straight to [facility]. And for medication management, she will follow-up at the [facility] here in [place], [place]. This is a walk-in appointment and patient is made aware of it. The addresses, dates, times and contact information for the [facility] and [facility] treatment center provided for patient in writing.  Besides the treatments received here and scheduled outpatient psychiatric services, patient was ordered and received, Wellbutrin XL 150 mg daily for depression, Buspar 10 mg twice daily for anxiety, Neurontin 300 mg tid for anxiety, Lithium Carbonate 300 mg bid, Nicotine patch 21 mg/24 hour daily for nicotine addiction and hydroxyzine 50 mg Q bedtime for anxiety. She was also encouraged to join/attend AA/NA meetings offered and held within her community after her completing her treatment at [facility].  Upon discharge, patient adamantly denies suicidal, homicidal ideations, auditory, visual hallucinations, delusional thoughts and or withdrawal symptoms. Patient left [facility] with all personal belongings in no apparent distress. She received 2 weeks worth supply samples of his discharge medications provided by [facility] pharmacy. Transportation per [employer]. Bus pass provided for patient in writing.   Consults:  psychiatry  Significant Diagnostic Studies:  labs: CBC with diff, CMP,UDS, Toxicology tests, Lithium Carbonate  Discharge Vitals:   Blood pressure 103/69, pulse 98, temperature 97.1 F (36.2 C), temperature source Oral, resp. rate 18, height 5\' 9"  (1.753 m), weight 84.369 kg (186 lb), last menstrual period 02/10/2013. Body mass index is 27.45 kg/(m^2). Lab  Results:   No results found for this or any previous visit (from the past 72 hour(s)).  Physical Findings: AIMS:  , ,  ,  ,    CIWA:  CIWA-Ar Total: 4 COWS:  COWS Total Score: 7  Psychiatric Specialty Exam: See Psychiatric Specialty Exam and Suicide Risk Assessment completed by Attending Physician prior to discharge.  Discharge destination:  [facility]  Is patient on multiple antipsychotic therapies at discharge:  No   Has Patient had three or more failed trials of antipsychotic monotherapy by history:  No  Recommended Plan for Multiple Antipsychotic Therapies: NA     Medication List    STOP taking these medications       clonazePAM 1 MG tablet  Commonly known as:  KLONOPIN     sertraline 100 MG tablet  Commonly known as:  ZOLOFT      TAKE these medications     Indication   buPROPion 150 MG 24 hr tablet  Commonly known as:  WELLBUTRIN XL  Take 1 tablet (150 mg total) by mouth daily. For depression   Indication:  Major Depressive Disorder     busPIRone 10 MG tablet  Commonly known as:  BUSPAR  Take 1 tablet (10 mg total) by mouth 2 (two) times daily. For anxiety   Indication:  Anxiety Disorder, Symptoms of Feeling Anxious, Generalized Anxiety Disorder     esomeprazole 40 MG capsule  Commonly known as:  NEXIUM  Take 1 capsule (40  mg total) by mouth daily before breakfast. For acid reflux   Indication:  Gastroesophageal Reflux Disease with Current Symptoms     ferrous sulfate 325 (65 FE) MG tablet  Take 1 tablet (325 mg total) by mouth 3 (three) times daily with meals. For low Iron   Indication:  Iron Deficiency     gabapentin 300 MG capsule  Commonly known as:  NEURONTIN  Take 1 capsule (300 mg total) by mouth 4 (four) times daily. For anxiety   Indication:  Agitation, Anxiety     hydrOXYzine 50 MG tablet  Commonly known as:  ATARAX/VISTARIL  Take 1 tablet (50 mg total) by mouth at bedtime as needed (insomnia).   Indication:  Sedation, Insomnia      levothyroxine 125 MCG tablet  Commonly known as:  SYNTHROID, LEVOTHROID  Take 1 tablet (125 mcg total) by mouth daily before breakfast. For thyroid hormone replacement   Indication:  Underactive Thyroid     lithium carbonate 300 MG capsule  Take 1 capsule (300 mg total) by mouth 2 (two) times daily with a meal. For mood stabilization   Indication:  Manic-Depression, Mood stabilization     multivitamin with minerals Tabs  Take 1 tablet by mouth daily. Can be purchased over the counter at the pharmacy: For vitamin supplement   Indication:  Vitamin supplement     nicotine 21 mg/24hr patch  Commonly known as:  NICODERM CQ - dosed in mg/24 hours  Place 1 patch onto the skin daily at 6 (six) AM. For nicotine addiction   Indication:  Nicotine Addiction     senna-docusate 8.6-50 MG per tablet  Commonly known as:  Senokot-S  Take 1 tablet by mouth 2 (two) times daily. For constipation   Indication:  Constipation           Follow-up Information   Follow up with [facility] On 04/02/2013. (Be at [facility] by Melba Coon. [facility] will pick you up to take you to the facility. Make sure you have 30 days worth of meds and your ID. )    Contact information:   [address] [place], [place] [address] phone: [phone] fax: [phone]      Follow up with [facility]. (Walk in Monday thru Friday 8AM-9AM for hospital followup/medication managment. )    Contact information:   [address] [place] [address] phone: [phone] fax: [phone]     Follow-up recommendations:  Activity:  As tolerated Diet: As recommended by your primary care doctor. Keep all scheduled follow-up appointments as recommended.  Comments:  Take all your medications as prescribed by your mental healthcare provider. Report any adverse effects and or reactions from your medicines to your outpatient provider promptly. Patient is instructed and cautioned to not engage in alcohol and or illegal  drug use while on prescription medicines. In the event of worsening symptoms, patient is instructed to call the crisis hotline, 911 and or go to the nearest ED for appropriate evaluation and treatment of symptoms. Follow-up with your primary care provider for your other medical issues, concerns and or health care needs.   Total Discharge Time:  Greater than 30 minutes.  Signed: Sanjuana Kava, FNP, PMHNP 04/01/2013, 3:24 PM  Patient is personally evaluated and care plan developed and case discussed with physician extender. Reviewed the information documented and agree with the discharge treatment plan.  Davelyn Gwinn,JANARDHAHA R. 04/02/2013 6:44 PM

## 2013-04-01 NOTE — Discharge Planning (Signed)
Pt will be d/cing approximately 6:30AM Thursday morning in order to catch bus to [facility] admission at 8AM! PA and RN aware of this and will have all d/c paperwork printed this afternoon. Pt also needs 30 day med supply--ok per Aggie PA. Bus pass is in chart.

## 2013-04-01 NOTE — Progress Notes (Signed)
Recreation Therapy Notes  Date: 06.24.2014  Time: 3:00pm Location: [address] Dayroom  Group Topic/Focus: Scientist, physiological, Communication  Participation Level:  Active  Participation Quality:  Appropriate  Affect:  Euthymic  Cognitive:  Appropriate  Additional Comments: Activity: Life Boat; Explanation: Patients were given the following problem solving scenario: We have chartered a Radio producer for the afternoon. Half way through our trip our boat springs a leak and we need to abandon ship. As a group you must decide which 8 of the following people you are going to save from the sinking ship: President Obama, Devona Konig, ex-marine, ex-convict, teacher, banker, Curator, Personnel officer, pregnant woman, female physician, nurse, priest, rabbi, Anthonette Legato, Investment banker, operational.   Patient actively participated in group activity. Patient gave sound reasoning and justification for her choices, such as wanting to save President Obama because everyone would look for him and then the group would be found as well. Patient was able to identify that she used logical decision making to select individuals from the list. Patient additionally stated that some of her decisions were emotional, such as saving the pregnant woman. Patient related this activity to being able to make good decisions for her support system, which would aid in her recovery.   Marykay Lex Herman Mell, LRT/CTRS  Jearl Klinefelter 04/01/2013 8:38 AM

## 2013-04-01 NOTE — Progress Notes (Signed)
Patient ID: [name], female   DOB: 1966/10/03, 47 y.o.   MRN: [id number] [facility] MD Progress Note  04/01/2013 10:40 AM MALEKA CONTINO  MRN:  [id number]  Subjective:  Ramyah reports that she still feels more anxious. She complained of being constipated. Would like to be discharge on the Nicotine patch because smoking is not allowed at the treatment center where she will residing for a least 28 days. Is ruminating about how she is going to cope after treatment program. Admits passive SI, denies any intent and plan to hurt self and or others.  Diagnosis:   Axis I: Substance Induced Mood Disorder and Polysubstance abuse, Alcohol dependence Axis II: Deferred Axis III:  Past Medical History  Diagnosis Date  . Peptic ulcer   . Alcohol abuse   . Anemia   . Benzodiazepine abuse   . Depression   . Anxiety   . Thyroid disease   . Alcoholism   . Narcotic abuse   . Back pain   . Thrombocytopenia 06/17/2011  . Hypothyroidism   . Seizures    Axis IV: economic problems, housing problems, occupational problems, other psychosocial or environmental problems and Substance abuse Axis V: 41-50 serious symptoms  ADL's:  Intact  Sleep: Fair  Appetite:  Fair  Suicidal Ideation:  Plan:  Denies Intent:  Denies Means:  Denies Homicidal Ideation:  Plan:  Denies Intent:  Denies Means:  Denies  AEB (as evidenced by):  Psychiatric Specialty Exam: Review of Systems  Constitutional: Negative.   HENT: Negative.   Eyes: Negative.   Respiratory: Negative.   Cardiovascular: Negative.   Gastrointestinal: Negative.   Genitourinary: Negative.   Musculoskeletal: Negative.   Skin: Negative.   Neurological: Positive for tremors.  Endo/Heme/Allergies: Negative.   Psychiatric/Behavioral: Positive for depression and substance abuse. Negative for suicidal ideas, hallucinations and memory loss. The patient is nervous/anxious and has insomnia.     Blood pressure 100/67, pulse 103, temperature 97.1  F (36.2 C), temperature source Oral, resp. rate 18, height 5\' 9"  (1.753 m), weight 84.369 kg (186 lb), last menstrual period 02/10/2013.Body mass index is 27.45 kg/(m^2).  General Appearance: Fairly Groomed  Patent attorney::  Poor  Speech:  Clear and Coherent  Volume:  Normal  Mood:  Anxious, Depressed, Dysphoric and Irritable  Affect:  Flat  Thought Process:  Coherent and Goal Directed  Orientation:  Full (Time, Place, and Person)  Thought Content:  Rumination  Suicidal Thoughts:  Yes.  without intent/plan  Homicidal Thoughts:  No  Memory:  Immediate;   Good Recent;   Good Remote;   Good  Judgement:  Impaired  Insight:  Fair  Psychomotor Activity:  Restlessness  Concentration:  Poor  Recall:  Good  Akathisia:  No  Handed:  Right  AIMS (if indicated):     Assets:  Desire for Improvement  Sleep:  Number of Hours: 6.5   Current Medications: Current Facility-Administered Medications  Medication Dose Route Frequency Provider Last Rate Last Dose  . acetaminophen (TYLENOL) tablet 650 mg  650 mg Oral Q6H PRN Shuvon Rankin, NP   650 mg at 03/31/13 0804  . alum & mag hydroxide-simeth (MAALOX/MYLANTA) 200-200-20 MG/5ML suspension 30 mL  30 mL Oral Q4H PRN Shuvon Rankin, NP      . buPROPion (WELLBUTRIN XL) 24 hr tablet 150 mg  150 mg Oral Daily Sanjuana Kava, NP   150 mg at 04/01/13 0806  . busPIRone (BUSPAR) tablet 10 mg  10 mg Oral BID Sanjuana Kava,  NP   10 mg at 04/01/13 0806  . chlordiazePOXIDE (LIBRIUM) capsule 25 mg  25 mg Oral Q6H PRN Shuvon Rankin, NP   25 mg at 03/30/13 0604  . chlordiazePOXIDE (LIBRIUM) capsule 25 mg  25 mg Oral BH-qamhs Shuvon Rankin, NP       Followed by  . [START ON 04/03/2013] chlordiazePOXIDE (LIBRIUM) capsule 25 mg  25 mg Oral Daily Shuvon Rankin, NP      . ferrous sulfate tablet 325 mg  325 mg Oral TID WC Shuvon Rankin, NP   325 mg at 03/30/13 0825  . gabapentin (NEURONTIN) capsule 200 mg  200 mg Oral TID Sanjuana Kava, NP   200 mg at 04/01/13 0805  .  hydrOXYzine (ATARAX/VISTARIL) tablet 25 mg  25 mg Oral Q6H PRN Shuvon Rankin, NP      . hydrOXYzine (ATARAX/VISTARIL) tablet 50 mg  50 mg Oral QHS PRN Verne Spurr, PA-C   50 mg at 03/31/13 2136  . levothyroxine (SYNTHROID, LEVOTHROID) tablet 125 mcg  125 mcg Oral QAC breakfast Shuvon Rankin, NP   125 mcg at 04/01/13 1610  . lithium carbonate capsule 300 mg  300 mg Oral BID WC Sanjuana Kava, NP   300 mg at 04/01/13 0806  . magnesium hydroxide (MILK OF MAGNESIA) suspension 30 mL  30 mL Oral Daily PRN Shuvon Rankin, NP   30 mL at 03/31/13 2136  . multivitamin with minerals tablet 1 tablet  1 tablet Oral Daily Shuvon Rankin, NP      . nicotine (NICODERM CQ - dosed in mg/24 hours) patch 21 mg  21 mg Transdermal Q0600 Rachael Fee, MD   21 mg at 04/01/13 9604  . ondansetron (ZOFRAN) tablet 4 mg  4 mg Oral Q8H PRN Shuvon Rankin, NP      . ondansetron (ZOFRAN-ODT) disintegrating tablet 4 mg  4 mg Oral Q6H PRN Shuvon Rankin, NP   4 mg at 03/30/13 0604  . pantoprazole (PROTONIX) EC tablet 40 mg  40 mg Oral Daily Sanjuana Kava, NP   40 mg at 04/01/13 0807  . senna-docusate (Senokot-S) tablet 1 tablet  1 tablet Oral BID Shuvon Rankin, NP   1 tablet at 04/01/13 0805  . thiamine (B-1) injection 100 mg  100 mg Intramuscular Once Shuvon Rankin, NP      . thiamine (VITAMIN B-1) tablet 100 mg  100 mg Oral Daily Shuvon Rankin, NP   100 mg at 04/01/13 5409    Lab Results: No results found for this or any previous visit (from the past 48 hour(s)).  Physical Findings: AIMS:  , ,  ,  ,    CIWA:  CIWA-Ar Total: 4 COWS:  COWS Total Score: 7  Treatment Plan Summary: Daily contact with patient to assess and evaluate symptoms and progress in treatment Medication management  Plan: Supportive approach/coping skills/relapse prevention. Magnesium Citrate 1 bottle x 1 for constipation. Continue lithium Carbonate 300 mg bid for mood stabilization. Start Buspar 10 mg bid for anxiety. Obtain Lithium levels in am on  04/02/13. Encouraged out of room, participation in group sessions and application of coping skills when distressed. Will continue to monitor response to/adverse effects of medications in use to assure effectiveness. Continue to monitor mood, behavior and interaction with staff and other patients. Discharge in am. Continue current plan of care.  Medical Decision Making Problem Points:  Established problem, worsening (2), Review of last therapy session (1) and Review of psycho-social stressors (1) Data Points:  Review or order  clinical lab tests (1) Review of medication regiment & side effects (2) Review of new medications or change in dosage (2)  I certify that inpatient services furnished can reasonably be expected to improve the patient's condition.   Sanjuana Kava, FNP, PMHNP-BC 04/01/2013, 10:40 AM

## 2013-04-02 DIAGNOSIS — F1420 Cocaine dependence, uncomplicated: Secondary | ICD-10-CM

## 2013-04-02 NOTE — Progress Notes (Signed)
Pt was discharge this morning to go to [facility].  Paperwork was reviewed and signed.  Pt was given her paperwork and supply of medications.  Pt's belongings from the locker were returned.  Pt voices understanding of getting to the bus stop and then riding to [place] where she will meet the staff from [facility].  Pt is nervous, but says she is ready to go.  Pt was given a snack to take with her.  Security transporting to the bus stop.

## 2013-04-06 NOTE — Progress Notes (Signed)
Patient Discharge Instructions:  After Visit Summary (AVS):   Faxed to:  04/06/13 Discharge Summary Note:   Faxed to:  04/06/13 Psychiatric Admission Assessment Note:   Faxed to:  04/06/13 Suicide Risk Assessment - Discharge Assessment:   Faxed to:  04/06/13 Faxed/Sent to the Next Level Care provider:  04/06/13 Faxed to [facility] @ [phone] Faxed to [facility] @ [phone]  Jerelene Redden, 04/06/2013, 2:50 PM

## 2013-04-14 ENCOUNTER — Emergency Department (HOSPITAL_COMMUNITY)
Admission: EM | Admit: 2013-04-14 | Discharge: 2013-04-14 | Disposition: A | Discharge status: Other Acute Inpatient Hospital | Payer: No Typology Code available for payment source | Attending: Emergency Medicine | Admitting: Emergency Medicine

## 2013-04-14 ENCOUNTER — Inpatient Hospital Stay (HOSPITAL_COMMUNITY)
Admission: AD | Admit: 2013-04-14 | Discharge: 2013-04-20 | DRG: 897 | Disposition: A | Discharge status: Home / Self Care | Payer: No Typology Code available for payment source | Source: Intra-hospital | Attending: Psychiatry | Admitting: Psychiatry

## 2013-04-14 ENCOUNTER — Encounter (HOSPITAL_COMMUNITY): Payer: Self-pay

## 2013-04-14 ENCOUNTER — Encounter (HOSPITAL_COMMUNITY): Payer: Self-pay | Admitting: *Deleted

## 2013-04-14 DIAGNOSIS — F1020 Alcohol dependence, uncomplicated: Secondary | ICD-10-CM | POA: Insufficient documentation

## 2013-04-14 DIAGNOSIS — R45851 Suicidal ideations: Secondary | ICD-10-CM

## 2013-04-14 DIAGNOSIS — B9689 Other specified bacterial agents as the cause of diseases classified elsewhere: Secondary | ICD-10-CM

## 2013-04-14 DIAGNOSIS — F32A Depression, unspecified: Secondary | ICD-10-CM

## 2013-04-14 DIAGNOSIS — F1910 Other psychoactive substance abuse, uncomplicated: Secondary | ICD-10-CM

## 2013-04-14 DIAGNOSIS — F329 Major depressive disorder, single episode, unspecified: Secondary | ICD-10-CM | POA: Insufficient documentation

## 2013-04-14 DIAGNOSIS — F17200 Nicotine dependence, unspecified, uncomplicated: Secondary | ICD-10-CM | POA: Insufficient documentation

## 2013-04-14 DIAGNOSIS — K279 Peptic ulcer, site unspecified, unspecified as acute or chronic, without hemorrhage or perforation: Secondary | ICD-10-CM

## 2013-04-14 DIAGNOSIS — K219 Gastro-esophageal reflux disease without esophagitis: Secondary | ICD-10-CM | POA: Diagnosis present

## 2013-04-14 DIAGNOSIS — F1994 Other psychoactive substance use, unspecified with psychoactive substance-induced mood disorder: Principal | ICD-10-CM

## 2013-04-14 DIAGNOSIS — R443 Hallucinations, unspecified: Secondary | ICD-10-CM | POA: Diagnosis present

## 2013-04-14 DIAGNOSIS — F1410 Cocaine abuse, uncomplicated: Secondary | ICD-10-CM

## 2013-04-14 DIAGNOSIS — N390 Urinary tract infection, site not specified: Secondary | ICD-10-CM

## 2013-04-14 DIAGNOSIS — F10232 Alcohol dependence with withdrawal with perceptual disturbance: Secondary | ICD-10-CM

## 2013-04-14 DIAGNOSIS — Z8711 Personal history of peptic ulcer disease: Secondary | ICD-10-CM | POA: Insufficient documentation

## 2013-04-14 DIAGNOSIS — M545 Low back pain, unspecified: Secondary | ICD-10-CM | POA: Diagnosis present

## 2013-04-14 DIAGNOSIS — F411 Generalized anxiety disorder: Secondary | ICD-10-CM | POA: Insufficient documentation

## 2013-04-14 DIAGNOSIS — Z79899 Other long term (current) drug therapy: Secondary | ICD-10-CM

## 2013-04-14 DIAGNOSIS — K921 Melena: Secondary | ICD-10-CM

## 2013-04-14 DIAGNOSIS — A5901 Trichomonal vulvovaginitis: Secondary | ICD-10-CM | POA: Diagnosis present

## 2013-04-14 DIAGNOSIS — D649 Anemia, unspecified: Secondary | ICD-10-CM | POA: Insufficient documentation

## 2013-04-14 DIAGNOSIS — Z598 Other problems related to housing and economic circumstances: Secondary | ICD-10-CM

## 2013-04-14 DIAGNOSIS — Z5900 Homelessness unspecified: Secondary | ICD-10-CM

## 2013-04-14 DIAGNOSIS — Z5987 Material hardship due to limited financial resources, not elsewhere classified: Secondary | ICD-10-CM

## 2013-04-14 DIAGNOSIS — E039 Hypothyroidism, unspecified: Secondary | ICD-10-CM | POA: Insufficient documentation

## 2013-04-14 DIAGNOSIS — G40909 Epilepsy, unspecified, not intractable, without status epilepticus: Secondary | ICD-10-CM | POA: Insufficient documentation

## 2013-04-14 DIAGNOSIS — Z862 Personal history of diseases of the blood and blood-forming organs and certain disorders involving the immune mechanism: Secondary | ICD-10-CM | POA: Insufficient documentation

## 2013-04-14 DIAGNOSIS — F1320 Sedative, hypnotic or anxiolytic dependence, uncomplicated: Secondary | ICD-10-CM | POA: Diagnosis present

## 2013-04-14 DIAGNOSIS — K283 Acute gastrojejunal ulcer without hemorrhage or perforation: Secondary | ICD-10-CM

## 2013-04-14 DIAGNOSIS — F3289 Other specified depressive episodes: Secondary | ICD-10-CM | POA: Insufficient documentation

## 2013-04-14 DIAGNOSIS — D696 Thrombocytopenia, unspecified: Secondary | ICD-10-CM

## 2013-04-14 DIAGNOSIS — Z3202 Encounter for pregnancy test, result negative: Secondary | ICD-10-CM | POA: Insufficient documentation

## 2013-04-14 DIAGNOSIS — Z8659 Personal history of other mental and behavioral disorders: Secondary | ICD-10-CM | POA: Insufficient documentation

## 2013-04-14 DIAGNOSIS — F10932 Alcohol use, unspecified with withdrawal with perceptual disturbance: Secondary | ICD-10-CM

## 2013-04-14 DIAGNOSIS — IMO0002 Reserved for concepts with insufficient information to code with codable children: Secondary | ICD-10-CM | POA: Insufficient documentation

## 2013-04-14 DIAGNOSIS — F419 Anxiety disorder, unspecified: Secondary | ICD-10-CM

## 2013-04-14 DIAGNOSIS — F10220 Alcohol dependence with intoxication, uncomplicated: Secondary | ICD-10-CM

## 2013-04-14 DIAGNOSIS — R748 Abnormal levels of other serum enzymes: Secondary | ICD-10-CM

## 2013-04-14 LAB — COMPREHENSIVE METABOLIC PANEL
ALT: 14 U/L (ref 0–35)
Albumin: 4 g/dL (ref 3.5–5.2)
Alkaline Phosphatase: 85 U/L (ref 39–117)
BUN: 9 mg/dL (ref 6–23)
Calcium: 9 mg/dL (ref 8.4–10.5)
GFR calc Af Amer: 87 mL/min — ABNORMAL LOW (ref >90)
Glucose, Bld: 83 mg/dL (ref 70–99)
Potassium: 3.7 mEq/L (ref 3.5–5.1)
Sodium: 133 mEq/L — ABNORMAL LOW (ref 135–145)
Total Protein: 7.8 g/dL (ref 6.0–8.3)

## 2013-04-14 LAB — POCT PREGNANCY, URINE: Preg Test, Ur: NEGATIVE

## 2013-04-14 LAB — CBC
MCH: 25.9 pg — ABNORMAL LOW (ref 26.0–34.0)
MCHC: 31.8 g/dL (ref 30.0–36.0)
RDW: 17.5 % — ABNORMAL HIGH (ref 11.5–15.5)

## 2013-04-14 LAB — ETHANOL: Alcohol, Ethyl (B): 201 mg/dL — ABNORMAL HIGH (ref 0–11)

## 2013-04-14 LAB — RAPID URINE DRUG SCREEN, HOSP PERFORMED
Amphetamines: NOT DETECTED
Benzodiazepines: POSITIVE — AB
Cocaine: POSITIVE — AB

## 2013-04-14 MED ORDER — CHLORDIAZEPOXIDE HCL 25 MG PO CAPS
50.0000 mg | ORAL_CAPSULE | Freq: Once | ORAL | Status: AC
  Administered 2013-04-14: 50 mg via ORAL
  Filled 2013-04-14: qty 2

## 2013-04-14 MED ORDER — LEVOTHYROXINE SODIUM 125 MCG PO TABS
125.0000 ug | ORAL_TABLET | Freq: Every day | ORAL | Status: DC
  Administered 2013-04-14: 125 ug via ORAL
  Filled 2013-04-14 (×2): qty 1

## 2013-04-14 MED ORDER — NICOTINE 21 MG/24HR TD PT24
21.0000 mg | MEDICATED_PATCH | Freq: Every day | TRANSDERMAL | Status: DC
  Administered 2013-04-14: 21 mg via TRANSDERMAL
  Filled 2013-04-14 (×2): qty 1

## 2013-04-14 MED ORDER — HYDROXYZINE HCL 25 MG PO TABS: 50.0000 mg | ORAL_TABLET | Freq: Every evening | ORAL | Status: DC | PRN

## 2013-04-14 MED ORDER — CHLORDIAZEPOXIDE HCL 25 MG PO CAPS
25.0000 mg | ORAL_CAPSULE | Freq: Four times a day (QID) | ORAL | Status: AC | PRN
  Administered 2013-04-15 – 2013-04-16 (×2): 25 mg via ORAL
  Filled 2013-04-14 (×4): qty 1

## 2013-04-14 MED ORDER — BUPROPION HCL ER (XL) 150 MG PO TB24
150.0000 mg | ORAL_TABLET | Freq: Every day | ORAL | Status: DC
  Administered 2013-04-14 – 2013-04-15 (×2): 150 mg via ORAL
  Filled 2013-04-14 (×5): qty 1

## 2013-04-14 MED ORDER — CHLORDIAZEPOXIDE HCL 25 MG PO CAPS
25.0000 mg | ORAL_CAPSULE | ORAL | Status: AC
  Administered 2013-04-17 (×2): 25 mg via ORAL
  Filled 2013-04-14: qty 1

## 2013-04-14 MED ORDER — ADULT MULTIVITAMIN W/MINERALS CH
1.0000 | ORAL_TABLET | Freq: Every day | ORAL | Status: DC
  Filled 2013-04-14: qty 1

## 2013-04-14 MED ORDER — LITHIUM CARBONATE 300 MG PO CAPS
300.0000 mg | ORAL_CAPSULE | Freq: Two times a day (BID) | ORAL | Status: DC
  Administered 2013-04-14: 300 mg via ORAL
  Filled 2013-04-14: qty 1

## 2013-04-14 MED ORDER — FERROUS SULFATE 325 (65 FE) MG PO TABS
325.0000 mg | ORAL_TABLET | Freq: Three times a day (TID) | ORAL | Status: DC
  Filled 2013-04-14 (×4): qty 1

## 2013-04-14 MED ORDER — VITAMIN B-1 100 MG PO TABS
100.0000 mg | ORAL_TABLET | Freq: Every day | ORAL | Status: DC
  Administered 2013-04-15 – 2013-04-20 (×6): 100 mg via ORAL
  Filled 2013-04-14 (×8): qty 1

## 2013-04-14 MED ORDER — LORAZEPAM 1 MG PO TABS
2.0000 mg | ORAL_TABLET | Freq: Once | ORAL | Status: AC
  Administered 2013-04-14: 2 mg via ORAL
  Filled 2013-04-14: qty 2

## 2013-04-14 MED ORDER — HYDROXYZINE HCL 25 MG PO TABS: 25.0000 mg | ORAL_TABLET | Freq: Four times a day (QID) | ORAL | Status: AC | PRN

## 2013-04-14 MED ORDER — PANTOPRAZOLE SODIUM 40 MG PO TBEC
40.0000 mg | DELAYED_RELEASE_TABLET | Freq: Every day | ORAL | Status: DC
  Administered 2013-04-14: 40 mg via ORAL
  Filled 2013-04-14: qty 1

## 2013-04-14 MED ORDER — LEVOTHYROXINE SODIUM 125 MCG PO TABS
125.0000 ug | ORAL_TABLET | Freq: Every day | ORAL | Status: DC
  Administered 2013-04-15 – 2013-04-20 (×6): 125 ug via ORAL
  Filled 2013-04-14 (×2): qty 1
  Filled 2013-04-14: qty 14
  Filled 2013-04-14 (×7): qty 1

## 2013-04-14 MED ORDER — CHLORDIAZEPOXIDE HCL 25 MG PO CAPS
25.0000 mg | ORAL_CAPSULE | Freq: Every day | ORAL | Status: AC
  Administered 2013-04-18: 25 mg via ORAL
  Filled 2013-04-14: qty 1

## 2013-04-14 MED ORDER — MAGNESIUM HYDROXIDE 400 MG/5ML PO SUSP: 30.0000 mL | Freq: Every day | ORAL | Status: DC | PRN

## 2013-04-14 MED ORDER — BUSPIRONE HCL 10 MG PO TABS
10.0000 mg | ORAL_TABLET | Freq: Two times a day (BID) | ORAL | Status: DC
  Administered 2013-04-14 – 2013-04-15 (×2): 10 mg via ORAL
  Filled 2013-04-14 (×6): qty 1

## 2013-04-14 MED ORDER — ONDANSETRON 4 MG PO TBDP
4.0000 mg | ORAL_TABLET | Freq: Four times a day (QID) | ORAL | Status: AC | PRN
  Administered 2013-04-14: 4 mg via ORAL
  Filled 2013-04-14: qty 1

## 2013-04-14 MED ORDER — GABAPENTIN 300 MG PO CAPS
300.0000 mg | ORAL_CAPSULE | Freq: Four times a day (QID) | ORAL | Status: DC
Start: 2013-04-14 — End: 2013-04-14
  Administered 2013-04-14: 300 mg via ORAL
  Filled 2013-04-14 (×4): qty 1

## 2013-04-14 MED ORDER — GABAPENTIN 300 MG PO CAPS
300.0000 mg | ORAL_CAPSULE | Freq: Four times a day (QID) | ORAL | Status: DC
  Administered 2013-04-14 – 2013-04-15 (×3): 300 mg via ORAL
  Filled 2013-04-14 (×10): qty 1

## 2013-04-14 MED ORDER — POTASSIUM CHLORIDE CRYS ER 20 MEQ PO TBCR
20.0000 meq | EXTENDED_RELEASE_TABLET | Freq: Once | ORAL | Status: AC
  Administered 2013-04-14: 20 meq via ORAL
  Filled 2013-04-14: qty 1

## 2013-04-14 MED ORDER — CHLORDIAZEPOXIDE HCL 25 MG PO CAPS
25.0000 mg | ORAL_CAPSULE | Freq: Three times a day (TID) | ORAL | Status: AC
  Administered 2013-04-16 (×3): 25 mg via ORAL
  Filled 2013-04-14 (×3): qty 1

## 2013-04-14 MED ORDER — NICOTINE 21 MG/24HR TD PT24
21.0000 mg | MEDICATED_PATCH | Freq: Every day | TRANSDERMAL | Status: DC
  Administered 2013-04-15 – 2013-04-20 (×6): 21 mg via TRANSDERMAL
  Filled 2013-04-14 (×8): qty 1

## 2013-04-14 MED ORDER — ADULT MULTIVITAMIN W/MINERALS CH: 1.0000 | ORAL_TABLET | Freq: Every day | ORAL | Status: DC

## 2013-04-14 MED ORDER — BUPROPION HCL ER (XL) 150 MG PO TB24
150.0000 mg | ORAL_TABLET | Freq: Every day | ORAL | Status: DC
  Administered 2013-04-14: 150 mg via ORAL
  Filled 2013-04-14: qty 1

## 2013-04-14 MED ORDER — BUSPIRONE HCL 10 MG PO TABS
10.0000 mg | ORAL_TABLET | Freq: Two times a day (BID) | ORAL | Status: DC
  Administered 2013-04-14 (×2): 10 mg via ORAL
  Filled 2013-04-14 (×2): qty 1

## 2013-04-14 MED ORDER — TRAZODONE HCL 50 MG PO TABS: 50.0000 mg | ORAL_TABLET | Freq: Every evening | ORAL | Status: DC | PRN

## 2013-04-14 MED ORDER — LITHIUM CARBONATE 300 MG PO CAPS
300.0000 mg | ORAL_CAPSULE | Freq: Two times a day (BID) | ORAL | Status: DC
  Administered 2013-04-14 – 2013-04-16 (×4): 300 mg via ORAL
  Filled 2013-04-14 (×10): qty 1

## 2013-04-14 MED ORDER — LOPERAMIDE HCL 2 MG PO CAPS: 2.0000 mg | ORAL_CAPSULE | ORAL | Status: AC | PRN

## 2013-04-14 MED ORDER — THIAMINE HCL 100 MG/ML IJ SOLN
100.0000 mg | Freq: Once | INTRAMUSCULAR | Status: AC
  Administered 2013-04-14: 100 mg via INTRAMUSCULAR

## 2013-04-14 MED ORDER — PANTOPRAZOLE SODIUM 40 MG PO TBEC
40.0000 mg | DELAYED_RELEASE_TABLET | Freq: Every day | ORAL | Status: DC
  Administered 2013-04-14 – 2013-04-20 (×7): 40 mg via ORAL
  Filled 2013-04-14 (×11): qty 1

## 2013-04-14 MED ORDER — ACETAMINOPHEN 325 MG PO TABS
650.0000 mg | ORAL_TABLET | Freq: Once | ORAL | Status: AC
  Administered 2013-04-14: 650 mg via ORAL
  Filled 2013-04-14: qty 2

## 2013-04-14 MED ORDER — ADULT MULTIVITAMIN W/MINERALS CH
1.0000 | ORAL_TABLET | Freq: Every day | ORAL | Status: DC
  Administered 2013-04-20: 1 via ORAL
  Filled 2013-04-14 (×3): qty 1
  Filled 2013-04-14: qty 14
  Filled 2013-04-14 (×6): qty 1

## 2013-04-14 MED ORDER — ACETAMINOPHEN 325 MG PO TABS
650.0000 mg | ORAL_TABLET | Freq: Four times a day (QID) | ORAL | Status: DC | PRN
  Administered 2013-04-14 – 2013-04-19 (×7): 650 mg via ORAL

## 2013-04-14 MED ORDER — ALUM & MAG HYDROXIDE-SIMETH 200-200-20 MG/5ML PO SUSP
30.0000 mL | ORAL | Status: DC | PRN
  Administered 2013-04-19: 30 mL via ORAL

## 2013-04-14 MED ORDER — CHLORDIAZEPOXIDE HCL 25 MG PO CAPS
25.0000 mg | ORAL_CAPSULE | Freq: Four times a day (QID) | ORAL | Status: AC
  Administered 2013-04-14 – 2013-04-15 (×6): 25 mg via ORAL
  Filled 2013-04-14 (×5): qty 1

## 2013-04-14 NOTE — Tx Team (Signed)
Initial Interdisciplinary Treatment Plan  PATIENT STRENGTHS: (choose at least two) Ability for insight General fund of knowledge  PATIENT STRESSORS: Substance abuse   PROBLEM LIST: Problem List/Patient Goals Date to be addressed Date deferred Reason deferred Estimated date of resolution  Racing thoughts and voices 04/14/13     Alcohol abuse  04/14/13                                                DISCHARGE CRITERIA:  Ability to meet basic life and health needs Adequate post-discharge living arrangements Improved stabilization in mood, thinking, and/or behavior Medical problems require only outpatient monitoring Motivation to continue treatment in a less acute level of care Need for constant or close observation no longer present Reduction of life-threatening or endangering symptoms to within safe limits Safe-care adequate arrangements made Verbal commitment to aftercare and medication compliance Withdrawal symptoms are absent or subacute and managed without 24-hour nursing intervention  PRELIMINARY DISCHARGE PLAN: Attend aftercare/continuing care group Attend 12-step recovery group  PATIENT/FAMIILY INVOLVEMENT: This treatment plan has been presented to and reviewed with the patient, Jacqueline [name], and/or family member,   The patient and family have been given the opportunity to ask questions and make suggestions.  Beatrix Shipper 04/14/2013, 1:16 PM
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
## 2013-04-14 NOTE — ED Notes (Signed)
Pt transferred from triage, presents intoxicated, crying upset, because her best friend is on a ventilator.  Pt is IVC, papers taken out by [employer], pt walking in front of traffic in attempt to kill herself.  Pt angry, anxious, demanding.  [employer] at bedside for assistance.

## 2013-04-14 NOTE — ED Provider Notes (Signed)
Medical screening examination/treatment/procedure(s) were performed by non-physician practitioner and as supervising physician I was immediately available for consultation/collaboration.  Jones Skene, M.D.     Jones Skene, MD 04/14/13 [phone]
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
## 2013-04-14 NOTE — [facility] Group Notes (Signed)
[facility] LCSW Group Therapy  04/14/2013 4:00 PM  Type of Therapy:  Group Therapy  Participation Level:  Did Not Attend  Jacqueline [name], Jacqueline [name] 04/14/2013, 4:00 PM  

## 2013-04-14 NOTE — [facility] Counselor (Signed)
Patient accepted to [facility] by Donell Sievert, PA to Dr. Geoffery Lyons. The room # is 305-2. The nursing report # is [phone]. Pt is under IVC and will be transported to [facility] via [employer].

## 2013-04-14 NOTE — ED Notes (Signed)
[employer] getting IVC papers

## 2013-04-14 NOTE — Progress Notes (Signed)
[facility] did not get to see patient but will be sending her information about the [employer] [facility], using the address provided.  °
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
## 2013-04-14 NOTE — Progress Notes (Signed)
Recreation Therapy Notes  Date: 07.08.2014 Time: 2:30pm Location: 300 Hall Dayroom      Group Topic/Focus: Animal Assist Activities/Therapy (AAA/T)  Participation Level: Active  Participation Quality: Appropriate  Affect: Euthymic  Cognitive: Appropriate  Additional Comments:  07.08.2014 Session = AAA Session; Dog Team: [place] and handler  Patient interacted appropriately with dog team, LRT and peers.   Shauntay Brunelli L Bodie Abernethy, LRT/CTRS  Neelam Tiggs L 04/14/2013 3:57 PM 

## 2013-04-14 NOTE — Progress Notes (Signed)
Recreation Therapy Notes  Date: 07.08.2014 Time: 3:00pm Location: 300 Hall Dayroom      Group Topic/Focus: Special educational needs teacher, Team Work, Problem Solving  Participation Level: None  Participation Quality: Observation  Affect: Euthymic  Cognitive: Appropriate   Additional Comments: Activity: Stage manager ; Explanation: Patient were given 12 drinking straws and a length of masking tape. Working together as a group patients were asked to create a landing pad for a golf ball sized soccer ball. Patients were instructed that ball could not roll out of landing pad or bounce off of landing pad.   Patient arrived to group at approximately 3:20pm. Due to late arrival to group session patient was not able to participate in group activity. Patient listened to wrap up discussion about using communication and team work to maintain sobriety.   Marykay Lex Zamantha Strebel, LRT/CTRS  Asako Saliba L 04/14/2013 4:12 PM
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
## 2013-04-14 NOTE — Consult Note (Signed)
Reason for Consult:Eval for IP psychiatric mgmt Referring Physician: Rulon Abide MD  Jacqueline [name] is an 47 y.o. female.  HPI: Pt is a 46/o WF with long standing hx and recent prior admissions in March and June of 2014 at [facility] for substance abuse mood d/o. Pt states she is depressed and rates her sx 10/10. Sx include hopelessness, helplessness, guilt, mood swings, racing thoughts and irritability. Pt endorses command SI telling her to run out in traffic and kill herself, but denies HI, visual hallucinations. Patient states she cannot contract for safety and will kill herself once she is d/c. patient states she just drank a quart of beer prior to being admitted to [facility] ED. Patient went to [facility] after being d/c from [facility] in June for one week duration but started drinking and feeling depressed shortly thereafter.  Past Medical History  Diagnosis Date  . Peptic ulcer   . Alcohol abuse   . Anemia   . Benzodiazepine abuse   . Depression   . Anxiety   . Thyroid disease   . Alcoholism   . Narcotic abuse   . Back pain   . Thrombocytopenia 06/17/2011  . Hypothyroidism   . Seizures     Past Surgical History  Procedure Laterality Date  . Cholecystectomy    . Abdominal surgery    . Esophagogastroduodenoscopy    . Gastric bypass    . Tubal ligation    . Esophagogastroduodenoscopy N/A 03/23/2013    Procedure: ESOPHAGOGASTRODUODENOSCOPY (EGD);  Surgeon: Meryl Dare, MD;  Location: [facility];  Service: Endoscopy;  Laterality: N/A;    Family History  Problem Relation Age of Onset  . Alcohol abuse Father   . Alcoholism Father   . Cancer Other     Social History:  reports that she has been smoking Cigarettes.  She has a 12 pack-year smoking history. She has never used smokeless tobacco. She reports that  drinks alcohol. She reports that she uses illicit drugs (Oxycodone and Cocaine).  Allergies:  Allergies  Allergen Reactions  . Nsaids Other (See Comments)    G.I. Bleed     Medications: I have reviewed the patient's current medications.  Results for orders placed during the hospital encounter of 04/14/13 (from the past 48 hour(s))  CBC     Status: Abnormal   Collection Time    04/14/13  1:52 AM      Result Value Range   WBC 9.4  4.0 - 10.5 K/uL   RBC 3.63 (*) 3.87 - 5.11 MIL/uL   Hemoglobin 9.4 (*) 12.0 - 15.0 g/dL   HCT 16.1 (*) 09.6 - 04.5 %   MCV 81.5  78.0 - 100.0 fL   MCH 25.9 (*) 26.0 - 34.0 pg   MCHC 31.8  30.0 - 36.0 g/dL   RDW 40.9 (*) 81.1 - 91.4 %   Platelets 578 (*) 150 - 400 K/uL  COMPREHENSIVE METABOLIC PANEL     Status: Abnormal   Collection Time    04/14/13  1:52 AM      Result Value Range   Sodium 133 (*) 135 - 145 mEq/L   Potassium 3.7  3.5 - 5.1 mEq/L   Chloride 97  96 - 112 mEq/L   CO2 22  19 - 32 mEq/L   Glucose, Bld 83  70 - 99 mg/dL   BUN 9  6 - 23 mg/dL   Creatinine, Ser 7.82  0.50 - 1.10 mg/dL   Calcium 9.0  8.4 - 95.6 mg/dL  Total Protein 7.8  6.0 - 8.3 g/dL   Albumin 4.0  3.5 - 5.2 g/dL   AST 22  0 - 37 U/L   ALT 14  0 - 35 U/L   Alkaline Phosphatase 85  39 - 117 U/L   Total Bilirubin 0.2 (*) 0.3 - 1.2 mg/dL   GFR calc non Af Amer 75 (*) >90 mL/min   GFR calc Af Amer 87 (*) >90 mL/min   Comment:            The eGFR has been calculated     using the CKD EPI equation.     This calculation has not been     validated in all clinical     situations.     eGFR's persistently     <90 mL/min signify     possible Chronic Kidney Disease.  ETHANOL     Status: Abnormal   Collection Time    04/14/13  1:52 AM      Result Value Range   Alcohol, Ethyl (B) 201 (*) 0 - 11 mg/dL   Comment:            LOWEST DETECTABLE LIMIT FOR     SERUM ALCOHOL IS 11 mg/dL     FOR MEDICAL PURPOSES ONLY  URINE RAPID DRUG SCREEN (HOSP PERFORMED)     Status: Abnormal   Collection Time    04/14/13  2:30 AM      Result Value Range   Opiates NONE DETECTED  NONE DETECTED   Cocaine POSITIVE (*) NONE DETECTED   Benzodiazepines POSITIVE  (*) NONE DETECTED   Amphetamines NONE DETECTED  NONE DETECTED   Tetrahydrocannabinol NONE DETECTED  NONE DETECTED   Barbiturates NONE DETECTED  NONE DETECTED   Comment:            DRUG SCREEN FOR MEDICAL PURPOSES     ONLY.  IF CONFIRMATION IS NEEDED     FOR ANY PURPOSE, NOTIFY LAB     WITHIN 5 DAYS.                LOWEST DETECTABLE LIMITS     FOR URINE DRUG SCREEN     Drug Class       Cutoff (ng/mL)     Amphetamine      1000     Barbiturate      200     Benzodiazepine   200     Tricyclics       300     Opiates          300     Cocaine          300     THC              50  POCT PREGNANCY, URINE     Status: None   Collection Time    04/14/13  2:39 AM      Result Value Range   Preg Test, Ur NEGATIVE  NEGATIVE   Comment:            THE SENSITIVITY OF THIS     METHODOLOGY IS >24 mIU/mL    No results found.  Review of Systems  Unable to perform ROS Psychiatric/Behavioral: Positive for depression, suicidal ideas, hallucinations and substance abuse. The patient is nervous/anxious and has insomnia.        Endorses depression, mood swings and racing thoughts. endorses SI with command auditory hallucinations telling her to run in traffic  All other systems  reviewed and are negative.   Blood pressure 118/62, pulse 97, temperature 98.7 F (37.1 C), temperature source Oral, resp. rate 24, last menstrual period 02/10/2013, SpO2 100.00%. Physical Exam  Nursing note and vitals reviewed. Constitutional: She is oriented to person, place, and time. She appears well-developed and well-nourished.  HENT:  Head: Normocephalic.  Eyes: Pupils are equal, round, and reactive to light.  Neck: Neck supple.  Cardiovascular: Normal rate.   Respiratory: Breath sounds normal.  GI: Soft. Bowel sounds are normal.  Neurological: She is alert and oriented to person, place, and time.  Skin:  Pale moist  Psychiatric: She has a normal mood and affect. Her behavior is normal.  Tearful, with poor eye  contact, over all sad and flat affect with Poor insight    Assessment/Plan: 1) Admit to 300 hall [facility] pending bed for crises mgmt, safety and stabilization of substance abuse mood d/o 2) Social work to aid in OP support services 3) Mgmt of applicable co-morbid conditions 4) Intensive IP psychotherapy and psychotropic Rx  Dorsey Authement E 04/14/2013, 4:28 AM
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
## 2013-04-14 NOTE — ED Provider Notes (Signed)
History    CSN: [id number] Arrival date & time 04/14/13  0130  First MD Initiated Contact with Patient 04/14/13 [phone]     Chief Complaint  Patient presents with  . Medical Clearance   (Consider location/radiation/quality/duration/timing/severity/associated sxs/prior Treatment) HPI Comments: Patient with a long-standing history of bipolar, and alcoholism, was found on [address] tonight.  Bystanders called her she was jumping in front of cars.  Patient states she is tired of living she can't stop drinking.  Her family has abandoned her and she is tired of trying.  She states, you can keep you for 72 hours limits, but I'm just going to go out and try again  The history is provided by the patient.   Past Medical History  Diagnosis Date  . Peptic ulcer   . Alcohol abuse   . Anemia   . Benzodiazepine abuse   . Depression   . Anxiety   . Thyroid disease   . Alcoholism   . Narcotic abuse   . Back pain   . Thrombocytopenia 06/17/2011  . Hypothyroidism   . Seizures    Past Surgical History  Procedure Laterality Date  . Cholecystectomy    . Abdominal surgery    . Esophagogastroduodenoscopy    . Gastric bypass    . Tubal ligation    . Esophagogastroduodenoscopy N/A 03/23/2013    Procedure: ESOPHAGOGASTRODUODENOSCOPY (EGD);  Surgeon: Meryl Dare, MD;  Location: [facility];  Service: Endoscopy;  Laterality: N/A;   Family History  Problem Relation Age of Onset  . Alcohol abuse Father   . Alcoholism Father   . Cancer Other    History  Substance Use Topics  . Smoking status: Current Every Day Smoker -- 1.00 packs/day for 12 years    Types: Cigarettes    Last Attempt to Quit: 08/08/2011  . Smokeless tobacco: Never Used  . Alcohol Use: Yes     Comment: drink 4 liters a day. 1 case/day   OB History   Grav Para Term Preterm Abortions TAB SAB Ect Mult Living   3 3  3      3      Review of Systems  Psychiatric/Behavioral: Positive for suicidal ideas and agitation.  Negative for self-injury.  All other systems reviewed and are negative.    Allergies  Nsaids  Home Medications   Current Outpatient Rx  Name  Route  Sig  Dispense  Refill  . buPROPion (WELLBUTRIN XL) 150 MG 24 hr tablet   Oral   Take 1 tablet (150 mg total) by mouth daily. For depression   30 tablet   0   . busPIRone (BUSPAR) 10 MG tablet   Oral   Take 1 tablet (10 mg total) by mouth 2 (two) times daily. For anxiety   60 tablet   0   . esomeprazole (NEXIUM) 40 MG capsule   Oral   Take 1 capsule (40 mg total) by mouth daily before breakfast. For acid reflux         . ferrous sulfate 325 (65 FE) MG tablet   Oral   Take 1 tablet (325 mg total) by mouth 3 (three) times daily with meals. For low Iron   90 tablet   1   . gabapentin (NEURONTIN) 300 MG capsule   Oral   Take 1 capsule (300 mg total) by mouth 4 (four) times daily. For anxiety   120 capsule   0   . hydrOXYzine (ATARAX/VISTARIL) 50 MG tablet   Oral  Take 1 tablet (50 mg total) by mouth at bedtime as needed (insomnia).   30 tablet   0   . levothyroxine (SYNTHROID, LEVOTHROID) 125 MCG tablet   Oral   Take 1 tablet (125 mcg total) by mouth daily before breakfast. For thyroid hormone replacement   30 tablet   0   . lithium carbonate 300 MG capsule   Oral   Take 1 capsule (300 mg total) by mouth 2 (two) times daily with a meal. For mood stabilization   60 capsule   0   . Multiple Vitamin (MULTIVITAMIN WITH MINERALS) TABS   Oral   Take 1 tablet by mouth daily. Can be purchased over the counter at the pharmacy: For vitamin supplement         . nicotine (NICODERM CQ - DOSED IN MG/24 HOURS) 21 mg/24hr patch   Transdermal   Place 1 patch onto the skin daily at 6 (six) AM. For nicotine addiction   28 patch   0   . senna-docusate (SENOKOT-S) 8.6-50 MG per tablet   Oral   Take 1 tablet by mouth 2 (two) times daily. For constipation   60 tablet   0    BP 118/62  Pulse 97  Temp(Src) 98.7 F  (37.1 C) (Oral)  Resp 24  SpO2 100%  LMP 02/10/2013 Physical Exam  Nursing note and vitals reviewed. Constitutional: She appears well-developed and well-nourished.  HENT:  Head: Normocephalic.  Eyes: Pupils are equal, round, and reactive to light.  Cardiovascular: Normal rate and regular rhythm.   Pulmonary/Chest: Effort normal and breath sounds normal.  Neurological: She is alert.  Skin: Skin is warm.  Psychiatric: Her affect is angry. She is agitated. Cognition and memory are normal. She expresses impulsivity. She exhibits a depressed mood. She expresses suicidal ideation. She expresses suicidal plans.    ED Course  Procedures (including critical care time) Labs Reviewed  CBC - Abnormal; Notable for the following:    RBC 3.63 (*)    Hemoglobin 9.4 (*)    HCT 29.6 (*)    MCH 25.9 (*)    RDW 17.5 (*)    Platelets 578 (*)    All other components within normal limits  COMPREHENSIVE METABOLIC PANEL - Abnormal; Notable for the following:    Sodium 133 (*)    Total Bilirubin 0.2 (*)    GFR calc non Af Amer 75 (*)    GFR calc Af Amer 87 (*)    All other components within normal limits  ETHANOL - Abnormal; Notable for the following:    Alcohol, Ethyl (B) 201 (*)    All other components within normal limits  URINE RAPID DRUG SCREEN (HOSP PERFORMED) - Abnormal; Notable for the following:    Cocaine POSITIVE (*)    Benzodiazepines POSITIVE (*)    All other components within normal limits  POCT PREGNANCY, URINE   No results found. No diagnosis found.  MDM   Will move to psych unit for evaluation, as patient is medically cleared for evaluation  Arman Filter, NP 04/14/13 0327

## 2013-04-14 NOTE — ED Notes (Signed)
Pt complaining of a headache, tylenol ordered per protocol, after pt gives urine sample

## 2013-04-14 NOTE — ED Notes (Addendum)
Pt changed into blue scrubs.  Two bag of belongings.  Seen and searched by security.

## 2013-04-15 MED ORDER — DOCUSATE SODIUM 100 MG PO CAPS
100.0000 mg | ORAL_CAPSULE | Freq: Two times a day (BID) | ORAL | Status: DC
  Administered 2013-04-15 – 2013-04-17 (×5): 100 mg via ORAL
  Filled 2013-04-15 (×10): qty 1

## 2013-04-15 MED ORDER — BUSPIRONE HCL 5 MG PO TABS
15.0000 mg | ORAL_TABLET | Freq: Two times a day (BID) | ORAL | Status: DC
  Administered 2013-04-15 – 2013-04-16 (×2): 15 mg via ORAL
  Filled 2013-04-15 (×6): qty 3

## 2013-04-15 MED ORDER — BENZTROPINE MESYLATE 0.5 MG PO TABS
0.5000 mg | ORAL_TABLET | Freq: Two times a day (BID) | ORAL | Status: DC
  Administered 2013-04-15 – 2013-04-16 (×3): 0.5 mg via ORAL
  Filled 2013-04-15 (×8): qty 1

## 2013-04-15 MED ORDER — BUPROPION HCL ER (XL) 300 MG PO TB24
300.0000 mg | ORAL_TABLET | Freq: Every day | ORAL | Status: DC
  Administered 2013-04-16 – 2013-04-20 (×5): 300 mg via ORAL
  Filled 2013-04-15 (×8): qty 1
  Filled 2013-04-15: qty 14

## 2013-04-15 MED ORDER — HALOPERIDOL 2 MG PO TABS
2.0000 mg | ORAL_TABLET | Freq: Two times a day (BID) | ORAL | Status: DC
  Administered 2013-04-15 – 2013-04-17 (×5): 2 mg via ORAL
  Filled 2013-04-15 (×2): qty 1
  Filled 2013-04-15: qty 2
  Filled 2013-04-15 (×4): qty 1
  Filled 2013-04-15: qty 2
  Filled 2013-04-15 (×3): qty 1

## 2013-04-15 MED ORDER — GABAPENTIN 400 MG PO CAPS
400.0000 mg | ORAL_CAPSULE | Freq: Four times a day (QID) | ORAL | Status: DC
  Administered 2013-04-15 – 2013-04-16 (×5): 400 mg via ORAL
  Filled 2013-04-15 (×12): qty 1

## 2013-04-15 NOTE — Progress Notes (Signed)
Adult Psychoeducational Group Note  Date:  04/15/2013 Time:  2:25 PM  Group Topic/Focus:  Personal Choices and Values:   The focus of this group is to help patients assess and explore the importance of values in their lives, how their values affect their decisions, how they express their values and what opposes their expression.  Participation Level:  Active  Participation Quality:  Appropriate  Affect:  Appropriate  Cognitive:  Appropriate  Insight: Good  Engagement in Group:  Engaged  Modes of Intervention:  Discussion, Education, Socialization and Support  Additional Comments:  Pt stated she wishes she could live here so that she wouldn't be tempted to drink. Pt offered support to others.  Reynolds Bowl 04/15/2013, 2:25 PM
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
## 2013-04-15 NOTE — Tx Team (Signed)
Interdisciplinary Treatment Plan Update (Adult)  Date: 04/15/2013   Time Reviewed: 10:15 AM  Progress in Treatment:  Attending groups: Yes Participating in groups:  Yes Taking medication as prescribed: Yes  Tolerating medication: Yes  Family/Significant othe contact made: Not yet. SPE required for pt.   Patient understands diagnosis: Yes, AEB seeking treatment for SI, ETOH detox, and depression. Discussing patient identified problems/goals with staff: Yes  Medical problems stabilized or resolved: Yes  Denies suicidal/homicidal ideation: Passive SI. Able to contract for safety.  Patient has not harmed self or Others: Yes  New problem(s) identified: Pt positive for AH. Pt left [facility] after one week and reports that she has no will to live.  Discharge Plan or Barriers: CSW and pt are currently exploring aftercare options. Pt may qualify for ACT services. Additional comments: Pt was brought in to [facility] by [employer]. Pt had been reported to be trying to walk into traffic on [address]. Patient admits to wanting to kill herself by jumping into traffic. Patient cannot contract for safety. Patient says that she will try to kill herself again once released. Patient was intoxicated when brought in to ED. Patient was placed on IVC by [facility] officers. Patient also reports voices telling her to kill herself. No HI or visual hallucinations at this time. Patient did go to [facility] after her last visit to [facility] but relapsed shortly thereafter. Patient is upset about a friend that is on life-support. Patient also drinks daily. She was positive for cocaine and benzos on UDS. Patient was very drowsy and was unable to answer many questions by this clinician.  Reason for Continuation of Hospitalization: Withdrawals-librium taper Mood stabilization SI Medication management Estimated length of stay: 3-5 days  For review of initial/current patient goals, please see plan of care.  Attendees:  Patient:    Family:     Physician: Geoffery Lyons MD 04/15/2013 10:16 AM   Nursing: Maureen Ralphs RN 04/15/2013 10:16 AM   Clinical Social Worker Chabeli Barsamian Smart, LCSWA  04/15/2013 10:16 AM   Other: Darden Dates Nurse CM  04/15/2013 10:16 AM   Other:  Aram Beecham RN 04/15/2013 10:16 AM   Other: Aggie PA 04/15/2013 10:16 AM   Other:    Scribe for Treatment Team:  [employer] LCSWA 04/15/2013 10:16 AM

## 2013-04-15 NOTE — Progress Notes (Signed)
D:  Jacqueline [name] reports that she slept poorly and her appetite is improving.  She rates depression at 10/10 and hopelessness at 9/10.  She is complaining of withdrawal symptoms of chills, tremors and agitation and she required a prn dose of librium this am.  She states that she has chronic back pain that is unrelieved by tylenol.  MD adjusted medications to help with back pain.  She is attending groups and is interacting appropriately with staff and other patients. A:  Medications administered as ordered.  Safety checks q 15 minutes.  Emotional support provided. R:  Safety maintained on unit.

## 2013-04-15 NOTE — BHH Group Notes (Signed)
[facility] LCSW Group Therapy  04/15/2013 3:41 PM  Type of Therapy:  Group Therapy  Participation Level:  Did Not Attend  Smart, Jacqueline [name] 04/15/2013, 3:41 PM

## 2013-04-15 NOTE — BHH Counselor (Signed)
Adult Psychosocial Assessment Update Interdisciplinary Team  Previous [facility] admissions/discharges:  Admissions: Discharges:  Date: 03/29/13 Date: 04/02/13  Date: 12/22/12 Date: 12/26/12  Date: 02/21/12 Date: 02/24/12  Date: 12/18/11 Date: 12/21/11  Date: Date:   Changes since the last Psychosocial Assessment (including adherence to outpatient mental health and/or substance abuse treatment, situational issues contributing to decompensation and/or relapse). After d/c last month, pt went to [facility] but reports that she left after one week due to "uncontrollabe urge to drink." Pt immediately relapsed and has been drinking alcohol/listerine daily. She states that she is severely depressed, active SI (but will contract for safety), and has "no will to live."             Discharge Plan 1. Will you be returning to the same living situation after discharge?   Yes: No:      If no, what is your plan?    Pt and CSW currently exploring aftercare options.        2. Would you like a referral for services when you are discharged? Yes:     If yes, for what services?  No:       [facility] for medication management. Pt may qualify for ACTT services. CSW and pt currently exploring aftercare options.        Summary and Recommendations (to be completed by the evaluator) Patient is a 47 year old Caucasian Female with a diagnosis of Alcohol Abuse and Mood Disorder NOS.  Patient lives in [place] alone.  Patient will benefit from crisis stabilization, medication evaluation, group therapy and psycho education in addition to case management for discharge planning.                         Signature:  Carmina Miller, 04/15/2013 8:00 AM

## 2013-04-15 NOTE — BHH Group Notes (Signed)
[facility] LCSW Aftercare Discharge Planning Group Note   04/15/2013 9:40 AM  Participation Quality:  Did not attend--pt seen by MD during group session. CSW to followup with pt individually to discuss d/c plan.   Smart, [employer]

## 2013-04-15 NOTE — Progress Notes (Addendum)
NUTRITION ASSESSMENT  Pt identified as at risk on the Malnutrition Screen Tool  INTERVENTION: 1. Educated patient on the importance of nutrition and encouraged intake of food and beverages. 2.  Continue MVI and thiamine daily.  Goal: Pt to meet >/= 90% of their estimated nutrition needs.  Monitor:  PO intake  Assessment:  Patient admitted with ETOH use and SI.  Patient was drinking 2 bottles of Listerine + beer daily.  Patient states that UBW is 160 lbs-165 lbs and had been gaining weight so recently she had begun  "cutting back"  in attempts to lose weight.  Reports that she is eating well here with no needs at this time.  47 y.o. female  Height: Ht Readings from Last 1 Encounters:  04/14/13 5\' 8"  (1.727 m)    Weight: Wt Readings from Last 1 Encounters:  04/14/13 175 lb (79.379 kg)    Weight Hx: Wt Readings from Last 10 Encounters:  04/14/13 175 lb (79.379 kg)  03/29/13 186 lb (84.369 kg)  03/29/13 190 lb (86.183 kg)  03/25/13 190 lb (86.183 kg)  03/22/13 193 lb 1.6 oz (87.59 kg)  03/22/13 193 lb 1.6 oz (87.59 kg)  12/22/12 180 lb (81.647 kg)  09/29/12 200 lb (90.719 kg)  05/15/12 170 lb (77.111 kg)  03/20/12 175 lb (79.379 kg)    BMI:  Body mass index is 26.61 kg/(m^2). Pt meets criteria for overweight based on current BMI.  Estimated Nutritional Needs: Kcal: 25-30 kcal/kg Protein: > 1 gram protein/kg Fluid: 1 ml/kcal  Diet Order: General Pt is also offered choice of unit snacks mid-morning and mid-afternoon.  Pt is eating as desired.   Lab results and medications reviewed.   Oran Rein, RD, LDN Clinical Inpatient Dietitian Pager:  [phone] Weekend and after hours pager:  [phone]

## 2013-04-16 MED ORDER — METRONIDAZOLE 250 MG PO TABS
250.0000 mg | ORAL_TABLET | Freq: Three times a day (TID) | ORAL | Status: DC
  Administered 2013-04-17 – 2013-04-20 (×11): 250 mg via ORAL
  Filled 2013-04-16 (×16): qty 1

## 2013-04-16 MED ORDER — BUSPIRONE HCL 15 MG PO TABS
15.0000 mg | ORAL_TABLET | Freq: Three times a day (TID) | ORAL | Status: DC
  Filled 2013-04-16: qty 1

## 2013-04-16 MED ORDER — GABAPENTIN 300 MG PO CAPS
600.0000 mg | ORAL_CAPSULE | Freq: Four times a day (QID) | ORAL | Status: DC
  Administered 2013-04-16 – 2013-04-20 (×16): 600 mg via ORAL
  Filled 2013-04-16 (×24): qty 2

## 2013-04-16 MED ORDER — METRONIDAZOLE 500 MG PO TABS
750.0000 mg | ORAL_TABLET | Freq: Every day | ORAL | Status: DC
  Administered 2013-04-16: 750 mg via ORAL
  Filled 2013-04-16 (×3): qty 1

## 2013-04-16 MED ORDER — BUSPIRONE HCL 15 MG PO TABS
15.0000 mg | ORAL_TABLET | Freq: Three times a day (TID) | ORAL | Status: DC
  Administered 2013-04-16 – 2013-04-17 (×4): 15 mg via ORAL
  Filled 2013-04-16 (×8): qty 1

## 2013-04-16 MED ORDER — BENZTROPINE MESYLATE 1 MG PO TABS
1.0000 mg | ORAL_TABLET | Freq: Two times a day (BID) | ORAL | Status: DC
  Administered 2013-04-16 – 2013-04-20 (×8): 1 mg via ORAL
  Filled 2013-04-16 (×2): qty 1
  Filled 2013-04-16: qty 28
  Filled 2013-04-16 (×4): qty 1
  Filled 2013-04-16: qty 28
  Filled 2013-04-16 (×6): qty 1

## 2013-04-16 NOTE — Progress Notes (Signed)
Patient ID: [name], female   DOB: October 20, 1965, 47 y.o.   MRN: [id number]  D: Pt was laying in bed during the assessment. Stated she's been having a/h for 2 yrs. "I just don't tell people because I don't want people to think I'm crazy". Writer informed pt of the addition of haldol to help decrease the hallucinations. Pt stated, "It's making me sleepy in combination with the other meds until it wears off."  A:  Support and encouragement was offered. 15 min checks continued for safety.  R: Pt remains safe.

## 2013-04-16 NOTE — Progress Notes (Signed)
Patient ID: Jacqueline [name], female   DOB: 07-10-1966, 47 y.o.   MRN: [id number] [facility] MD Progress Note  04/16/2013 1:37 PM Jacqueline [name]  MRN:  [id number]  Subjective:  Continues to have a hard time. She says that she is still feeling very depressed. Rated her depression at #10. Says she is feeling pretty awful. Now her family does not want to deal with her ever because she left [facility]. Says she does not think that she will ever get well. Still endorsing auditory hallucinations. States, "I'm feeling pretty awful". 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  Diagnosis:  Alcohol Dependence/hallucinations/Depression/R/O PTSD  ADL's:  Intact  Sleep: Fair  Appetite:  Fair  Suicidal Ideation:  Plan:  denies Intent:  denies Means:  denies Homicidal Ideation:  Plan:  denies Intent:  denies Means:  denies AEB (as evidenced by):  Psychiatric Specialty Exam: Review of Systems  Constitutional: Negative.   HENT: Negative.   Eyes: Negative.   Respiratory: Negative.   Cardiovascular: Negative.   Gastrointestinal: Negative.   Genitourinary: Negative.   Musculoskeletal: Negative.   Skin: Negative.   Neurological: Negative.   Endo/Heme/Allergies: Negative.   Psychiatric/Behavioral: Positive for depression, hallucinations and substance abuse. The patient is nervous/anxious.     Blood pressure 112/64, pulse 73, temperature 97.9 F (36.6 C), temperature source Oral, resp. rate 16, height 5\' 8"  (1.727 m), weight 79.379 kg (175 lb), last menstrual period 03/30/2013.Body mass index is 26.61 kg/(m^2).  General Appearance: Disheveled  Eye Solicitor::  Fair  Speech:  Clear and Coherent  Volume:  fluctuates  Mood:  Anxious, Depressed, Hopeless, Worthless and worried  Affect:  anxious, depressed, worried  Thought Process:  Coherent and Goal Directed  Orientation:  Full (Time, Place, and Person)  Thought Content:  worries, concerns, auditory hallucinations  Suicidal Thoughts:  Intermittent   Homicidal Thoughts:  No  Memory:  Immediate;   Fair Recent;   Fair Remote;   Fair  Judgement:  Fair  Insight:  Present  Psychomotor Activity:  Restlessness  Concentration:  Fair  Recall:  Fair  Akathisia:  No  Handed:  Right  AIMS (if indicated):     Assets:  Desire for Improvement  Sleep:  Number of Hours: 6.5   Current Medications: Current Facility-Administered Medications  Medication Dose Route Frequency Provider Last Rate Last Dose  . acetaminophen (TYLENOL) tablet 650 mg  650 mg Oral Q6H PRN Sanjuana Kava, NP   650 mg at 04/16/13 7829  . alum & mag hydroxide-simeth (MAALOX/MYLANTA) 200-200-20 MG/5ML suspension 30 mL  30 mL Oral Q4H PRN Sanjuana Kava, NP      . benztropine (COGENTIN) tablet 0.5 mg  0.5 mg Oral BID Rachael Fee, MD   0.5 mg at 04/16/13 0757  . buPROPion (WELLBUTRIN XL) 24 hr tablet 300 mg  300 mg Oral Daily Rachael Fee, MD   300 mg at 04/16/13 0758  . busPIRone (BUSPAR) tablet 15 mg  15 mg Oral BID Rachael Fee, MD   15 mg at 04/16/13 0758  . chlordiazePOXIDE (LIBRIUM) capsule 25 mg  25 mg Oral Q6H PRN Sanjuana Kava, NP   25 mg at 04/15/13 0942  . chlordiazePOXIDE (LIBRIUM) capsule 25 mg  25 mg Oral TID Sanjuana Kava, NP   25 mg at 04/16/13 1204   Followed by  . [START ON 04/17/2013] chlordiazePOXIDE (LIBRIUM) capsule 25 mg  25 mg Oral BH-qamhs Sanjuana Kava, NP       Followed by  . [  START ON 04/18/2013] chlordiazePOXIDE (LIBRIUM) capsule 25 mg  25 mg Oral Daily Sanjuana Kava, NP      . docusate sodium (COLACE) capsule 100 mg  100 mg Oral BID Kerry Hough, PA-C   100 mg at 04/16/13 0758  . gabapentin (NEURONTIN) capsule 400 mg  400 mg Oral QID Rachael Fee, MD   400 mg at 04/16/13 1204  . haloperidol (HALDOL) tablet 2 mg  2 mg Oral BID Rachael Fee, MD   2 mg at 04/16/13 0758  . hydrOXYzine (ATARAX/VISTARIL) tablet 25 mg  25 mg Oral Q6H PRN Sanjuana Kava, NP      . levothyroxine (SYNTHROID, LEVOTHROID) tablet 125 mcg  125 mcg Oral QAC breakfast Sanjuana Kava, NP   125 mcg at 04/16/13 0612  . lithium carbonate capsule 300 mg  300 mg Oral BID WC Sanjuana Kava, NP   300 mg at 04/16/13 0757  . loperamide (IMODIUM) capsule 2-4 mg  2-4 mg Oral PRN Sanjuana Kava, NP      . magnesium hydroxide (MILK OF MAGNESIA) suspension 30 mL  30 mL Oral Daily PRN Sanjuana Kava, NP      . metroNIDAZOLE (FLAGYL) tablet 750 mg  750 mg Oral Q breakfast Rachael Fee, MD   750 mg at 04/16/13 1204  . multivitamin with minerals tablet 1 tablet  1 tablet Oral Daily Sanjuana Kava, NP      . nicotine (NICODERM CQ - dosed in mg/24 hours) patch 21 mg  21 mg Transdermal Q0600 Sanjuana Kava, NP   21 mg at 04/16/13 0615  . ondansetron (ZOFRAN-ODT) disintegrating tablet 4 mg  4 mg Oral Q6H PRN Sanjuana Kava, NP   4 mg at 04/14/13 1739  . pantoprazole (PROTONIX) EC tablet 40 mg  40 mg Oral Daily Sanjuana Kava, NP   40 mg at 04/16/13 0758  . thiamine (VITAMIN B-1) tablet 100 mg  100 mg Oral Daily Sanjuana Kava, NP   100 mg at 04/16/13 0757  . traZODone (DESYREL) tablet 50 mg  50 mg Oral QHS PRN Sanjuana Kava, NP        Lab Results:  No results found for this or any previous visit (from the past 48 hour(s)).  Physical Findings: AIMS: Facial and Oral Movements Muscles of Facial Expression: None, normal Lips and Perioral Area: None, normal Jaw: None, normal Tongue: None, normal,Extremity Movements Upper (arms, wrists, hands, fingers): None, normal Lower (legs, knees, ankles, toes): None, normal, Trunk Movements Neck, shoulders, hips: None, normal, Overall Severity Severity of abnormal movements (highest score from questions above): None, normal Incapacitation due to abnormal movements: None, normal Patient's awareness of abnormal movements (rate only patient's report): No Awareness, Dental Status Current problems with teeth and/or dentures?: No Does patient usually wear dentures?: No  CIWA:  CIWA-Ar Total: 1 COWS:  COWS Total Score: 8  Treatment Plan Summary: Daily  contact with patient to assess and evaluate symptoms and progress in treatment Medication management  Plan: Supportive approach/coping skills/relapse prevention Continue Haldol 2 mg BID/Cogentin 0.5 mg BID. Flagyl 750 mg for T. Vaginosis. Continue current plan of care.  Medical Decision Making Problem Points:  Review of psycho-social stressors (1) Data Points:  Review of medication regiment & side effects (2) Review of new medications or change in dosage (2)  I certify that inpatient services furnished can reasonably be expected to improve the patient's condition.   Sanjuana Kava, FNP, PMHNP-BC  04/16/2013, 1:37 PM

## 2013-04-16 NOTE — [facility] Suicide Risk Assessment (Signed)
[facility] INPATIENT:  Family/Significant Other Suicide Prevention Education  Suicide Prevention Education:  Education Completed; Geographical information systems officer (patient's daughter) has been identified by the patient as the family member/significant other with whom the patient will be residing, and identified as the person(s) who will aid the patient in the event of a mental health crisis (suicidal ideations/suicide attempt).  With written consent from the patient, the family member/significant other has been provided the following suicide prevention education, prior to the and/or following the discharge of the patient.  The suicide prevention education provided includes the following:  Suicide risk factors  Suicide prevention and interventions  National Suicide Hotline telephone number  [facility] assessment telephone number  [facility] Emergency Assistance 911  [facility] and/or Residential Mobile Crisis Unit telephone number  Request made of family/significant other to:  Remove weapons (e.g., guns, rifles, knives), all items previously/currently identified as safety concern.    Remove drugs/medications (over-the-counter, prescriptions, illicit drugs), all items previously/currently identified as a safety concern.  The family member/significant other verbalizes understanding of the suicide prevention education information provided.  The family member/significant other agrees to remove the items of safety concern listed above.  Smart, Waynetta Metheny 04/16/2013, 3:56 PM

## 2013-04-16 NOTE — [facility] Group Notes (Signed)
[facility] LCSW Group Therapy  04/16/2013 3:42 PM  Type of Therapy:  Group Therapy  Participation Level:  Active  Participation Quality:  Intrusive and Monopolizing  Affect:  Depressed and Resistant  Cognitive:  Lacking  Insight:  Lacking  Engagement in Therapy:  Limited  Modes of Intervention:  Confrontation, Discussion, Education, Exploration, Socialization and Support  Summary of Progress/Problems:  Finding Balance in Life. Today's group focused on defining balance in one's own words, identifying things that can knock one off balance, and exploring healthy ways to maintain balance in life. Group members were asked to provide an example of a time when they felt off balance, describe how they handled that situation,and process healthier ways to regain balance in the future. Group members were asked to share the most important tool for maintaining balance that they learned while at [facility] and how they plan to apply this method after discharge. Jacqueline [name] presented with tearful/depressed affect throughout group and was resistant to openly exploring issues pertaining to balance and imbalance in her life. Jacqueline [name] did not demonstrate the ability to process how her addiction impacts her sense of balance. [name] identified "losing my family" as her primary source of imbalance in life. CSW and group members assisted her in exploring how her addiction caused this rift between her and her children/family. Jacqueline [name] demonstrated a severe lack of insight into how her addiction impacted her family members, caused them to cut ties with her, and thus, created a sense of imbalance in her life (in addition to her alcoholism). She does not seem to be progressing in therapy group due to her resistance to taking responsibility for her actions and demonstrating a motivation for change.    Smart, Wynona Duhamel 04/16/2013, 3:42 PM

## 2013-04-16 NOTE — Progress Notes (Signed)
Recreation Therapy Notes  Date: 07.10.2014 Time: 3:00pm Location: [address] Dayroom      Group Topic/Focus: Leisure Education  Participation Level: Active  Participation Quality: Appropriate, Attentive and Sharing  Affect: Euthymic  Cognitive: Appropriate   Additional Comments: Activity: Leisure Alphabet; Explanation: Patients were given a worksheet with the 26 letters of the alphabet on it. Patients were asked to identify a leisure/recreation activity for each letter of the alphabet.   Patient participated in opening discussion about what leisure means. Patient actively participated in group activity. Patient stated she had difficulty identify some letters of the alphabet, following peer and LRT encouragement patient was able to identify all 26 letters of the alphabet. Patient contributed to the group list of leisure/recreation activities. Patient actively participated in wrap up discussion about using leisure/recreation in a healthy way, as well as using leisure/recreation to build a healthy support system. Patient stated leisure/recreation can be used to build bonds with people. Patient was careful to point out that activities should be positive and that individuals should be sober. Patient stated that doing both of these things can help with her maintain her sobriety.    Marykay Lex Cadan Maggart, LRT/CTRS  Donnah Levert L 04/16/2013 4:20 PM

## 2013-04-16 NOTE — Progress Notes (Signed)
D:  Patient up and present in the milieu much of the shift.  Rates depression and hopelessness at 10.  Also endorsed suicidal or self harm thoughts.  She is attending some of the groups.  Has minimal insight into her role in her disease process.  She complained of having symptoms of bacterial vaginosis.   A:  Medications given as prescribed.  Spoke with Dr. Dub Mikes regarding possible BV.  Order obtained for antibiotic.  Encouraged participation in groups.   R:  Irritable at times, but mostly cooperative.  Safety is maintained on the unit.  Continues to exhibit medication seeking behaviors.

## 2013-04-16 NOTE — [facility] Group Notes (Signed)
[facility] LCSW Aftercare Discharge Planning Group Note   04/16/2013 10:00 AM  Participation Quality:  Minimal  Mood/Affect:  Depressed  Depression Rating:  10  Anxiety Rating:  10  Hoplessness: 10  Thoughts of Suicide:  Yes Will you contract for safety?   Yes  Current AVH:  No  Plan for Discharge/Comments:  Pt reports severe hopelessness and no will to live. She reports that she is ashamed for leaving [facility] in order to drink and states that her family will not speak to her because of that decision. She reports that she walked in front of car on [address] in suicide attempt before being IVCed to [facility]. She reports passive SI but will contract for safety. Pt not willing to talk about d/c plan and states that "I guess I'll just go to a halfway house and go to [facility]." "I just don't care about my life right now. I'm waiting to die."  Transportation Means: bus  Supports: none. Family cut ties with pt after she left [facility] a few weeks ago.   Smart, [employer]

## 2013-04-17 MED ORDER — HALOPERIDOL 2 MG PO TABS
2.0000 mg | ORAL_TABLET | Freq: Three times a day (TID) | ORAL | Status: DC
  Administered 2013-04-17 – 2013-04-20 (×9): 2 mg via ORAL
  Filled 2013-04-17: qty 42
  Filled 2013-04-17 (×9): qty 1
  Filled 2013-04-17 (×2): qty 42
  Filled 2013-04-17 (×5): qty 1

## 2013-04-17 MED ORDER — HALOPERIDOL 2 MG PO TABS
2.0000 mg | ORAL_TABLET | Freq: Three times a day (TID) | ORAL | Status: DC
  Filled 2013-04-17 (×4): qty 1

## 2013-04-17 MED ORDER — LITHIUM CARBONATE 300 MG PO CAPS
300.0000 mg | ORAL_CAPSULE | Freq: Two times a day (BID) | ORAL | Status: DC
  Administered 2013-04-17 – 2013-04-20 (×6): 300 mg via ORAL
  Filled 2013-04-17: qty 28
  Filled 2013-04-17 (×10): qty 1
  Filled 2013-04-17: qty 28

## 2013-04-17 MED ORDER — DOCUSATE SODIUM 100 MG PO CAPS
200.0000 mg | ORAL_CAPSULE | Freq: Two times a day (BID) | ORAL | Status: DC
  Administered 2013-04-17 – 2013-04-20 (×6): 200 mg via ORAL
  Filled 2013-04-17: qty 56
  Filled 2013-04-17 (×9): qty 2
  Filled 2013-04-17: qty 56
  Filled 2013-04-17: qty 2

## 2013-04-17 NOTE — Progress Notes (Signed)
Patient ID: Jacqueline [name], female   DOB: [birth year], 47 y.o.   MRN: [id number] Patient presents with sad, anxious affect.  She reports that she is depressed and is still having self harm thoughts.  She does contract for safety on the unit.  She denies any HI/AVH.  Patient exhibits some med seeking behavior; she was distressed that her librium prn had been discontinued.  She also is very concerned over seeing the doctor.  Patient has been cooperative.  She has been attending groups and participating.  Continue to monitor medication management and MD orders.  Safety checks completed every 15 minutes per protocol.  Patient's behavior has been appropriate.

## 2013-04-17 NOTE — Progress Notes (Signed)
Adult Psychoeducational Group Note  Date:  04/17/2013 Time:  1:37 PM  Group Topic/Focus:  Recovery Goals:   The focus of this group is to identify appropriate goals for recovery and establish a plan to achieve them.  Participation Level:  Active  Participation Quality:  Appropriate  Affect:  Appropriate  Cognitive:  Appropriate  Insight: Appropriate  Engagement in Group:  Engaged  Modes of Intervention:  Discussion  Additional Comments:    Jacqueline [name] 04/17/2013, 1:37 PM

## 2013-04-17 NOTE — Progress Notes (Signed)
Patient did attend the evening karaoke group. Pt was attentive and supportive.   

## 2013-04-17 NOTE — Discharge Planning (Signed)
CSW talked to Jacqueline [name] at [facility]. She has admit date of 7/21 (Monday) and was accepted into recovery house for Monday 7/14. Pt will follow up at [facility] for med management and has been set up with the transitional care team. The TCT will work with pt to set her up with ACT services through [facility].

## 2013-04-17 NOTE — Progress Notes (Signed)
[facility] MD Progress Note  04/17/2013 5:15 PM JALEXIA LALLI  MRN:  [id number] Subjective:  Jacqueline [name] continues to have a hard time. She is having more of the racing thughs since we D/C the Lithium. She is also feeling agitated. States that the voices seem to be decreasing. She feels very unstable. She is frightened by the idea of getting out of here and being by herself feeling this way.  Diagnosis:  Alcohol Dependence, Hallucinosis, Mood Disorder NOS  ADL's:  Intact  Sleep: Fair  Appetite:  Fair  Suicidal Ideation:  Plan:  denies Intent:  denies Means:  denies Homicidal Ideation:  Plan:  denies Intent:  denies Means:  denies AEB (as evidenced by):  Psychiatric Specialty Exam: Review of Systems  Constitutional: Negative.   HENT: Negative.   Eyes: Negative.   Respiratory: Negative.   Cardiovascular: Negative.   Gastrointestinal: Negative.   Genitourinary: Negative.   Musculoskeletal: Negative.   Skin: Negative.   Neurological: Negative.   Endo/Heme/Allergies: Negative.   Psychiatric/Behavioral: Positive for depression, hallucinations and substance abuse. The patient is nervous/anxious.     Blood pressure 114/69, pulse 84, temperature 98 F (36.7 C), temperature source Oral, resp. rate 18, height 5\' 8"  (1.727 m), weight 79.379 kg (175 lb), last menstrual period 03/30/2013.Body mass index is 26.61 kg/(m^2).  General Appearance: Fairly Groomed  Patent attorney::  Fair  Speech:  Clear and Coherent and shaky  Volume:  fluctuates  Mood:  Anxious, Depressed, Dysphoric, Irritable and worried  Affect:  anxious, depressed, teary eyed  Thought Process:  Coherent and Goal Directed  Orientation:  Full (Time, Place, and Person)  Thought Content:  worries, concerns  Suicidal Thoughts:  No  Homicidal Thoughts:  No  Memory:  Immediate;   Fair Recent;   Fair Remote;   Fair  Judgement:  Fair  Insight:  Present  Psychomotor Activity:  Restlessness  Concentration:  Fair  Recall:  Fair   Akathisia:  No  Handed:  Right  AIMS (if indicated):     Assets:  Desire for Improvement  Sleep:  Number of Hours: 6   Current Medications: Current Facility-Administered Medications  Medication Dose Route Frequency Provider Last Rate Last Dose  . acetaminophen (TYLENOL) tablet 650 mg  650 mg Oral Q6H PRN Sanjuana Kava, NP   650 mg at 04/16/13 7829  . alum & mag hydroxide-simeth (MAALOX/MYLANTA) 200-200-20 MG/5ML suspension 30 mL  30 mL Oral Q4H PRN Sanjuana Kava, NP      . benztropine (COGENTIN) tablet 1 mg  1 mg Oral BID Rachael Fee, MD   1 mg at 04/17/13 0753  . buPROPion (WELLBUTRIN XL) 24 hr tablet 300 mg  300 mg Oral Daily Rachael Fee, MD   300 mg at 04/17/13 0752  . chlordiazePOXIDE (LIBRIUM) capsule 25 mg  25 mg Oral BH-qamhs Sanjuana Kava, NP   25 mg at 04/17/13 0756   Followed by  . [START ON 04/18/2013] chlordiazePOXIDE (LIBRIUM) capsule 25 mg  25 mg Oral Daily Sanjuana Kava, NP      . docusate sodium (COLACE) capsule 200 mg  200 mg Oral BID Rachael Fee, MD      . gabapentin (NEURONTIN) capsule 600 mg  600 mg Oral QID Rachael Fee, MD   600 mg at 04/17/13 1154  . haloperidol (HALDOL) tablet 2 mg  2 mg Oral TID Rachael Fee, MD      . levothyroxine (SYNTHROID, LEVOTHROID) tablet 125 mcg  125 mcg  Oral QAC breakfast Sanjuana Kava, NP   125 mcg at 04/17/13 0618  . lithium carbonate capsule 300 mg  300 mg Oral BID WC Rachael Fee, MD      . magnesium hydroxide (MILK OF MAGNESIA) suspension 30 mL  30 mL Oral Daily PRN Sanjuana Kava, NP      . metroNIDAZOLE (FLAGYL) tablet 250 mg  250 mg Oral TID Rachael Fee, MD   250 mg at 04/17/13 1153  . multivitamin with minerals tablet 1 tablet  1 tablet Oral Daily Sanjuana Kava, NP      . nicotine (NICODERM CQ - dosed in mg/24 hours) patch 21 mg  21 mg Transdermal Q0600 Sanjuana Kava, NP   21 mg at 04/17/13 0618  . pantoprazole (PROTONIX) EC tablet 40 mg  40 mg Oral Daily Sanjuana Kava, NP   40 mg at 04/17/13 0751  . thiamine (VITAMIN  B-1) tablet 100 mg  100 mg Oral Daily Sanjuana Kava, NP   100 mg at 04/17/13 1610    Lab Results: No results found for this or any previous visit (from the past 48 hour(s)).  Physical Findings: AIMS: Facial and Oral Movements Muscles of Facial Expression: None, normal Lips and Perioral Area: None, normal Jaw: None, normal Tongue: None, normal,Extremity Movements Upper (arms, wrists, hands, fingers): None, normal Lower (legs, knees, ankles, toes): None, normal, Trunk Movements Neck, shoulders, hips: None, normal, Overall Severity Severity of abnormal movements (highest score from questions above): None, normal Incapacitation due to abnormal movements: None, normal Patient's awareness of abnormal movements (rate only patient's report): No Awareness, Dental Status Current problems with teeth and/or dentures?: No Does patient usually wear dentures?: No  CIWA:  CIWA-Ar Total: 5 COWS:  COWS Total Score: 8  Treatment Plan Summary: Daily contact with patient to assess and evaluate symptoms and progress in treatment Medication management  Plan: Supportive approach/coping skills/relapse prevention           Resume Lithium 300 mg BID           D/C the Buspar           Increase the Haldol to 2 mg TID  Medical Decision Making Problem Points:  Review of last therapy session (1) and Review of psycho-social stressors (1) Data Points:  Review of medication regiment & side effects (2) Review of new medications or change in dosage (2)  I certify that inpatient services furnished can reasonably be expected to improve the patient's condition.   Muhammadali Ries A 04/17/2013, 5:15 PM

## 2013-04-17 NOTE — [facility] Group Notes (Signed)
[facility] LCSW Group Therapy  04/17/2013 3:47 PM  Type of Therapy:  Group Therapy  Participation Level:  Active  Participation Quality:  Sharing  Affect:  Excited  Cognitive:  Alert  Insight:  Engaged  Engagement in Therapy:  Improving  Modes of Intervention:  Confrontation, Discussion, Education, Exploration, Socialization and Support  Summary of Progress/Problems: Feelings around Relapse. Group members discussed the meaning of relapse and shared personal stories of relapse, how it affected them and others, and how they perceived themselves during this time. Group members were encouraged to identify triggers, warning signs and coping skills used when facing the possibility of relapse. Social supports were discussed and explored in detail. Post Acute Withdrawal Syndrome (handout provided) was introduced and examined. Pt's were encouraged to ask questions, talk about key points associated with PAWS, and process this information in terms of relapse prevention. Jacqueline [name] stated that she feels more hopeless after this relapse than she ever had in the past. She reported that she feels like she will never get her drinking under control, acknowledging that she lacks motivation. Jacqueline [name] shared that her daughters agreed to visit with her Sunday and that she feels more hopeful knowing that they are supporting her. Jacqueline [name] was encouraged to think about how she can keep her children in her life. "I have to stop drinking. If I drink, they are gone." Her insight is progressing AEB her ability to recognize that she must ultimately choose her children or alcohol. "I can't have both." Jacqueline [name] is still resistant to utilizing the knowledge she has about positive coping skills and applying this knowledge in her daily life.    [name], Jacqueline Gabbard 04/17/2013, 3:47 PM

## 2013-04-17 NOTE — [facility] Group Notes (Signed)
[facility] LCSW Aftercare Discharge Planning Group Note   04/17/2013 11:32 AM  Participation Quality:  Appropriate   Mood/Affect:  Excited  Depression Rating:  5  Anxiety Rating:  9  Thoughts of Suicide:  Yes/Passive SI "It never goes away." Will you contract for safety?   Yes  Current AVH:  No  Plan for Discharge/Comments:  Pt exploring halfway houses-[facility] for [facility]. She plans to follow up at [facility] and [facility] transitional care team scheduled to visit with pt. They will assist her with transport and referral to [facility] ACT team.   Transportation Means: bus/Transitional Care team through [facility]  Supports: very limited family support at this time. No social supports identified.   Smart, [employer]

## 2013-04-17 NOTE — Progress Notes (Signed)
D   Pt is depressed and sad   She did attend karoke group  Pt interacts appropriately but minimally with others   Pt requested all the medications she could get before bedtime A   Verbal support given   Medications administered and effectiveness monitored  Q 15 min checks R   Pt safe at present

## 2013-04-18 MED ORDER — LIDOCAINE 5 % EX PTCH
1.0000 | MEDICATED_PATCH | Freq: Once | CUTANEOUS | Status: AC
  Administered 2013-04-18: 1 via TRANSDERMAL
  Filled 2013-04-18: qty 1

## 2013-04-18 MED ORDER — LIDOCAINE 5 % EX PTCH
1.0000 | MEDICATED_PATCH | Freq: Every day | CUTANEOUS | Status: DC
  Administered 2013-04-19 – 2013-04-20 (×2): 1 via TRANSDERMAL
  Filled 2013-04-18 (×4): qty 1
  Filled 2013-04-18: qty 14

## 2013-04-18 NOTE — Progress Notes (Signed)
Patient ID: Jacqueline [name], female   DOB: [birth year], 47 y.o.   MRN: [id number] [facility] MD Progress Note  04/18/2013 12:06 PM Jacqueline [name]  MRN:  [id number] Subjective: Patient reports that she is anxious has aches and pains is irritable and short tempered with little tolerance for other people.  Objective: Patient is a well-developed well-nourished white female in no apparent distress frustrated and anxious his last see was score was 5. She is anxious, but denies suicidal or homicidal ideation.  Diagnosis:  Alcohol Dependence, Hallucinosis, Mood Disorder NOS  ADL's:  Intact  Sleep: Fair  Appetite:  Fair  Suicidal Ideation:  Plan:  denies Intent:  denies Means:  denies Homicidal Ideation:  Plan:  denies Intent:  denies Means:  denies AEB (as evidenced by): Patient's report of decreasing symptoms, improved appetite and sleep, affect, behavior, and participation in unit programming.  Psychiatric Specialty Exam: Review of Systems  Constitutional: Negative.   HENT: Negative.   Eyes: Negative.   Respiratory: Negative.   Cardiovascular: Negative.   Gastrointestinal: Negative.   Genitourinary: Negative.   Musculoskeletal: Negative.   Skin: Negative.   Neurological: Negative.   Endo/Heme/Allergies: Negative.   Psychiatric/Behavioral: Positive for depression, hallucinations and substance abuse. The patient is nervous/anxious.     Blood pressure 86/61, pulse 102, temperature 97.6 F (36.4 C), temperature source Oral, resp. rate 18, height 5\' 8"  (1.727 m), weight 79.379 kg (175 lb), last menstrual period 03/30/2013.Body mass index is 26.61 kg/(m^2).  General Appearance: Fairly Groomed  Patent attorney::  Fair  Speech:  Clear and Coherent and shaky  Volume:  fluctuates  Mood:  Anxious, Depressed, Dysphoric, Irritable and worried  Affect:  anxious, depressed, teary eyed  Thought Process:  Coherent and Goal Directed  Orientation:  Full (Time, Place, and Person)  Thought Content:   worries, concerns  Suicidal Thoughts:  No  Homicidal Thoughts:  No  Memory:  Immediate;   Fair Recent;   Fair Remote;   Fair  Judgement:  Fair  Insight:  Present  Psychomotor Activity:  Restlessness  Concentration:  Fair  Recall:  Fair  Akathisia:  No  Handed:  Right  AIMS (if indicated):     Assets:  Desire for Improvement  Sleep:  Number of Hours: 6.5   Current Medications: Current Facility-Administered Medications  Medication Dose Route Frequency Provider Last Rate Last Dose  . acetaminophen (TYLENOL) tablet 650 mg  650 mg Oral Q6H PRN Sanjuana Kava, NP   650 mg at 04/18/13 0646  . alum & mag hydroxide-simeth (MAALOX/MYLANTA) 200-200-20 MG/5ML suspension 30 mL  30 mL Oral Q4H PRN Sanjuana Kava, NP      . benztropine (COGENTIN) tablet 1 mg  1 mg Oral BID Rachael Fee, MD   1 mg at 04/18/13 0746  . buPROPion (WELLBUTRIN XL) 24 hr tablet 300 mg  300 mg Oral Daily Rachael Fee, MD   300 mg at 04/18/13 0745  . docusate sodium (COLACE) capsule 200 mg  200 mg Oral BID Rachael Fee, MD   200 mg at 04/18/13 0746  . gabapentin (NEURONTIN) capsule 600 mg  600 mg Oral QID Rachael Fee, MD   600 mg at 04/18/13 0746  . haloperidol (HALDOL) tablet 2 mg  2 mg Oral TID Rachael Fee, MD   2 mg at 04/18/13 0746  . levothyroxine (SYNTHROID, LEVOTHROID) tablet 125 mcg  125 mcg Oral QAC breakfast Sanjuana Kava, NP   125 mcg at 04/18/13 5956  .  lidocaine (LIDODERM) 5 % 1 patch  1 patch Transdermal Q24H Verne Spurr, PA-C      . lithium carbonate capsule 300 mg  300 mg Oral BID WC Rachael Fee, MD   300 mg at 04/18/13 0746  . magnesium hydroxide (MILK OF MAGNESIA) suspension 30 mL  30 mL Oral Daily PRN Sanjuana Kava, NP      . metroNIDAZOLE (FLAGYL) tablet 250 mg  250 mg Oral TID Rachael Fee, MD   250 mg at 04/18/13 0746  . multivitamin with minerals tablet 1 tablet  1 tablet Oral Daily Sanjuana Kava, NP      . nicotine (NICODERM CQ - dosed in mg/24 hours) patch 21 mg  21 mg Transdermal Q0600  Sanjuana Kava, NP   21 mg at 04/18/13 4098  . pantoprazole (PROTONIX) EC tablet 40 mg  40 mg Oral Daily Sanjuana Kava, NP   40 mg at 04/18/13 0746  . thiamine (VITAMIN B-1) tablet 100 mg  100 mg Oral Daily Sanjuana Kava, NP   100 mg at 04/18/13 1191    Lab Results: No results found for this or any previous visit (from the past 48 hour(s)).  Physical Findings: AIMS: Facial and Oral Movements Muscles of Facial Expression: None, normal Lips and Perioral Area: None, normal Jaw: None, normal Tongue: None, normal,Extremity Movements Upper (arms, wrists, hands, fingers): None, normal Lower (legs, knees, ankles, toes): None, normal, Trunk Movements Neck, shoulders, hips: None, normal, Overall Severity Severity of abnormal movements (highest score from questions above): None, normal Incapacitation due to abnormal movements: None, normal Patient's awareness of abnormal movements (rate only patient's report): No Awareness, Dental Status Current problems with teeth and/or dentures?: No Does patient usually wear dentures?: No  CIWA:  CIWA-Ar Total: 5 COWS:  COWS Total Score: 8  Treatment Plan Summary: Daily contact with patient to assess and evaluate symptoms and progress in treatment Medication management  Plan: Supportive approach/coping skills/relapse prevention           Resume Lithium 300 mg BID           Increase the Haldol to 2 mg TID            Will order a lithium level for tonight.           Did discuss coping skills and distraction for treating anxiety.           Had also ordered a Lidoderm patch for the patient's low back pain. Patient is given encouragement and supportive counseling and positive suggestions for obtaining employment in [place]. We'll follow. Medical Decision Making Problem Points:  Review of last therapy session (1) and Review of psycho-social stressors (1) Data Points:  Review of medication regiment & side effects (2) Review of new medications or change in  dosage (2)  I certify that inpatient services furnished can reasonably be expected to improve the patient's condition.  Rona Ravens. [place] RPAC 1:04 PM 04/18/2013 I agreed with the findings of H&P. I am involved in the treatment Plan.

## 2013-04-18 NOTE — Progress Notes (Signed)
D. Pt has been up and has been active in milieu this evening, has been participating in various activities. Pt has endorsed having racing thoughts and anxiety and feelings of agitation. Pt does endorse feelings of depression but has denied any SI and feels her medications are not quite right yet and hopes adjustments will help her. A. Medication education completed, support and encouragement provided. R. Pt verbalized understanding, will continue to monitor.

## 2013-04-18 NOTE — Progress Notes (Signed)
Patient ID: [name], female   DOB: [birth year], 47 y.o.   MRN: [id number] D: Pt is awake and active on the unit this AM. Pt endorses passive SI but she is able to contract for safety. Pt rates their depression at 9 and hopelessness at 10. Pt mood is depressed and her affect is sad/anxious. Pt writes that she does not have a home and is concerned about placement. Pt does not seems vested in tx or in remaining sober after discharge. Writer will continue to monitor.   A: Encouraged pt to discuss feelings with staff and administered medication per MD orders. Writer also encouraged pt to participate in groups.  R: Pt is attending groups and tolerating medications well. Writer will continue to monitor. 15 minute checks are ongoing for safety.

## 2013-04-18 NOTE — Progress Notes (Signed)
Adult Psychoeducational Group Note  Date:  04/18/2013 Time:  7:00 PM  Group Topic/Focus:  Healthy Communication:   The focus of this group is to discuss communication, barriers to communication, as well as healthy ways to communicate with others.  Participation Level:  Active  Participation Quality:  Appropriate  Affect:  Appropriate  Cognitive:  Appropriate  Insight: Appropriate  Engagement in Group:  Engaged  Modes of Intervention:  Discussion  Additional Comments:    Elijio Miles 04/18/2013, 7:00 PM

## 2013-04-18 NOTE — [facility] Group Notes (Signed)
[facility] Group Notes:  (Nursing/MHT/Case Management/Adjunct)  Date:  04/18/2013  Time:  3:36 PM  Type of Therapy:  Psychoeducational Skills  Participation Level:  Active  Participation Quality:  Appropriate  Affect:  Appropriate  Cognitive:  Alert  Insight:  Appropriate  Engagement in Group:  Improving  Modes of Intervention:  Problem-solving  Summary of Progress/Problems: Pt attended group , and was able to engage in treatment. Pt reported that she could come up with any healthy choices, because she loves drinking beer. Pt reported that she has been through several treatment programs, and learned nothing. Pt reported that she wished that she could have her children and drink as well. Pt reported that since she can not have her children and drink, so she just wants to die. Pt reported that having a good life is not attainable.    Jacqueline [name] Shanta 04/18/2013, 3:36 PM

## 2013-04-18 NOTE — [facility] Group Notes (Signed)
[facility] Group Notes:  (Clinical Social Work)  04/18/2013     10-11AM  Summary of Progress/Problems:   The main focus of today's process group was for the patient to identify ways in which they have in the past sabotaged their own recovery. Motivational Interviewing was utilized to ask the group members what they get out of their substance use, and what reasons they may have for wanting to change.  The Stages of Change were explained using a handout, and patients identified where they currently are with regard to stages of change.  The patient expressed that she likes drinking and the happy/numb feeling she gets.  She talked continually throughout group about the positive effects she gets from alcohol, including blackouts, which means she does not have to think.  When asked why she would want to change, she gave a variety of reasons she did NOT want to change.  As she spoke, she revealed, however, that she has been in the hospital many times medically for side effects from the drinking, such as GI bleeds.  She is in early contemplation stage, started asking for an ACT Team when someone else in the group discussed it.  Type of Therapy:  Group Therapy - Process   Participation Level:  Active  Participation Quality:  Monopolizing and Redirectable  Affect:  Blunted and Defensive  Cognitive:  Oriented  Insight:  Limited  Engagement in Therapy:  Developing/Improving  Modes of Intervention:  Education, Support and Processing, Motivational Interviewing  Jacqueline Mantle, LCSW 04/18/2013, 12:17 PM

## 2013-04-18 NOTE — Progress Notes (Signed)
Adult Psychoeducational Group Note  Date:  04/18/2013 Time:  0900  Group Topic/Focus:  Making Healthy Choices:   The focus of this group is to help patients identify negative/unhealthy choices they were using prior to admission and identify positive/healthier coping strategies to replace them upon discharge. Relapse Prevention Planning:   The focus of this group is to define relapse and discuss the need for planning to combat relapse.  Participation Level:  Minimal  Participation Quality:  Appropriate  Affect:  Appropriate  Cognitive:  Appropriate  Insight: Improving  Engagement in Group:  Engaged  Modes of Intervention:  Discussion and Education  Additional Comments:    Barbette Merino, Tri Chittick Shari Prows 04/18/2013, 11:15 AM

## 2013-04-19 DIAGNOSIS — F10951 Alcohol use, unspecified with alcohol-induced psychotic disorder with hallucinations: Secondary | ICD-10-CM

## 2013-04-19 DIAGNOSIS — F1020 Alcohol dependence, uncomplicated: Secondary | ICD-10-CM

## 2013-04-19 DIAGNOSIS — F10229 Alcohol dependence with intoxication, unspecified: Secondary | ICD-10-CM

## 2013-04-19 NOTE — Progress Notes (Signed)
Patient ID: Jacqueline [name], female   DOB: 08-16-1966, 47 y.o.   MRN: [id number] D)  Attended group this evening, came to the med window afterward.  Affect remains flat, wouldn't answer when asked how she was , then shrugged.  Stated she "was here", answered other questions with a shrug, didn't want to engage in conversation, took hs med and walked back to her room. A)  Will continue to try to develop therapeutic rapport, continue POC R)  Safety maintained.

## 2013-04-19 NOTE — [facility] Group Notes (Signed)
[facility] Group Notes:  (Clinical Social Work)  04/19/2013  10:00-11:00AM  Summary of Progress/Problems:   The main focus of today's process group was to   identify the patient's current support system and decide on other supports that can be put in place.  Four definitions/levels of support were discussed and an exercise was utilized to show how much stronger we become with additional supports.  An emphasis was placed on using counselor, doctor, therapy groups, 12-step groups, and problem-specific support groups to expand supports, as well as doing something different than has been done before. The patient expressed complete resistance throughout group to any possibility of doing anything different in her life.  Every suggestion that was made, she challenged it, whether to CSW or another patient, even asking another patient why he was in the hospital if he was doing everything right while he was out.  She remained entrenched that there is no way she can leave the hospital and not resort to drinking, because it has happened several times already.  Her mother has told her she would have been better off if she died, and patient kept coming back to this abandonment, despite everyone focusing on where to look for other supports.  She scoffed and made fun of every suggestion, i.e., "Oh yeah, I'm depressed so I want to go listen to a bunch of depressed people," when a depression support group was mentioned.  She said by the end of group that she had been given some things to think about.  Type of Therapy:  Process Group with Motivational Interviewing  Participation Level:  Active  Participation Quality:  Attentive, Resistant and Sharing  Affect:  Depressed and Flat  Cognitive:  Oriented  Insight:  Limited  Engagement in Therapy:  Developing/Improving  Modes of Intervention:   Education, Support and Processing, Activity  Ambrose Mantle, LCSW 04/19/2013, 3:03 PM

## 2013-04-19 NOTE — Progress Notes (Signed)
Patient ID: Jacqueline [name], female   DOB: June 06, 1966, 47 y.o.   MRN: [id number] Patient ID: Jacqueline [name], female   DOB: September 24, 1966, 47 y.o.   MRN: [id number] [facility] MD Progress Note  04/19/2013 4:27 PM LATRONDA SPINK  MRN:  [id number]  Subjective: Shaylea reports, "I'm not looking forward to being discharged anytime. I like it here.  The social worker is trying to place me to some other treatment center. My family won't give a shit about me no more. I keep messing up".  Objective: Patient is a well-developed well-nourished white female in no apparent distress frustrated and anxious his last see was score was 5. She is anxious, but denies suicidal or homicidal ideation.  Diagnosis:  Alcohol Dependence, Hallucinosis, Mood Disorder NOS  ADL's:  Intact  Sleep: Fair  Appetite:  Fair  Suicidal Ideation:  Plan:  denies Intent:  denies Means:  denies Homicidal Ideation:  Plan:  denies Intent:  denies Means:  denies AEB (as evidenced by): Patient's report of decreasing symptoms, improved appetite and sleep, affect, behavior, and participation in unit programming.  Psychiatric Specialty Exam: Review of Systems  Constitutional: Negative.   HENT: Negative.   Eyes: Negative.   Respiratory: Negative.   Cardiovascular: Negative.   Gastrointestinal: Negative.   Genitourinary: Negative.   Musculoskeletal: Negative.   Skin: Negative.   Neurological: Negative.   Endo/Heme/Allergies: Negative.   Psychiatric/Behavioral: Positive for depression, hallucinations and substance abuse. The patient is nervous/anxious.     Blood pressure 105/65, pulse 95, temperature 97.6 F (36.4 C), temperature source Oral, resp. rate 18, height 5\' 8"  (1.727 m), weight 79.379 kg (175 lb), last menstrual period 03/30/2013.Body mass index is 26.61 kg/(m^2).  General Appearance: Fairly Groomed  Patent attorney::  Fair  Speech:  Clear and Coherent and shaky  Volume:  fluctuates  Mood:  Anxious, Depressed,  Dysphoric, Irritable and worried  Affect:  anxious, depressed, teary eyed  Thought Process:  Coherent and Goal Directed  Orientation:  Full (Time, Place, and Person)  Thought Content:  worries, concerns  Suicidal Thoughts:  No  Homicidal Thoughts:  No  Memory:  Immediate;   Fair Recent;   Fair Remote;   Fair  Judgement:  Fair  Insight:  Present  Psychomotor Activity:  Restlessness  Concentration:  Fair  Recall:  Fair  Akathisia:  No  Handed:  Right  AIMS (if indicated):     Assets:  Desire for Improvement  Sleep:  Number of Hours: 6   Current Medications: Current Facility-Administered Medications  Medication Dose Route Frequency Provider Last Rate Last Dose  . acetaminophen (TYLENOL) tablet 650 mg  650 mg Oral Q6H PRN Sanjuana Kava, NP   650 mg at 04/19/13 0617  . alum & mag hydroxide-simeth (MAALOX/MYLANTA) 200-200-20 MG/5ML suspension 30 mL  30 mL Oral Q4H PRN Sanjuana Kava, NP      . benztropine (COGENTIN) tablet 1 mg  1 mg Oral BID Rachael Fee, MD   1 mg at 04/19/13 0745  . buPROPion (WELLBUTRIN XL) 24 hr tablet 300 mg  300 mg Oral Daily Rachael Fee, MD   300 mg at 04/19/13 0744  . docusate sodium (COLACE) capsule 200 mg  200 mg Oral BID Rachael Fee, MD   200 mg at 04/19/13 0745  . gabapentin (NEURONTIN) capsule 600 mg  600 mg Oral QID Rachael Fee, MD   600 mg at 04/19/13 1128  . haloperidol (HALDOL) tablet 2 mg  2 mg Oral  TID Rachael Fee, MD   2 mg at 04/19/13 1128  . levothyroxine (SYNTHROID, LEVOTHROID) tablet 125 mcg  125 mcg Oral QAC breakfast Sanjuana Kava, NP   125 mcg at 04/19/13 0618  . lidocaine (LIDODERM) 5 % 1 patch  1 patch Transdermal Daily Verne Spurr, PA-C   1 patch at 04/19/13 0747  . lithium carbonate capsule 300 mg  300 mg Oral BID WC Rachael Fee, MD   300 mg at 04/19/13 0745  . magnesium hydroxide (MILK OF MAGNESIA) suspension 30 mL  30 mL Oral Daily PRN Sanjuana Kava, NP      . metroNIDAZOLE (FLAGYL) tablet 250 mg  250 mg Oral TID Rachael Fee, MD   250 mg at 04/19/13 1128  . multivitamin with minerals tablet 1 tablet  1 tablet Oral Daily Sanjuana Kava, NP      . nicotine (NICODERM CQ - dosed in mg/24 hours) patch 21 mg  21 mg Transdermal Q0600 Sanjuana Kava, NP   21 mg at 04/19/13 9604  . pantoprazole (PROTONIX) EC tablet 40 mg  40 mg Oral Daily Sanjuana Kava, NP   40 mg at 04/19/13 0745  . thiamine (VITAMIN B-1) tablet 100 mg  100 mg Oral Daily Sanjuana Kava, NP   100 mg at 04/19/13 0745    Lab Results: No results found for this or any previous visit (from the past 48 hour(s)).  Physical Findings: AIMS: Facial and Oral Movements Muscles of Facial Expression: None, normal Lips and Perioral Area: None, normal Jaw: None, normal Tongue: None, normal,Extremity Movements Upper (arms, wrists, hands, fingers): None, normal Lower (legs, knees, ankles, toes): None, normal, Trunk Movements Neck, shoulders, hips: None, normal, Overall Severity Severity of abnormal movements (highest score from questions above): None, normal Incapacitation due to abnormal movements: None, normal Patient's awareness of abnormal movements (rate only patient's report): No Awareness, Dental Status Current problems with teeth and/or dentures?: No Does patient usually wear dentures?: No  CIWA:  CIWA-Ar Total: 1 COWS:  COWS Total Score: 8  Treatment Plan Summary: Daily contact with patient to assess and evaluate symptoms and progress in treatment Medication management  Plan: Supportive approach/coping skills/relapse prevention Continue Lithium at 300 mg BID, may adjust dose when level is known. Continue Haldol to 2 mg TID Obtain lithium level in am 04/20/13. Discharge plan in progress.   Medical Decision Making Problem Points:  Review of last therapy session (1) and Review of psycho-social stressors (1) Data Points:  Review of medication regiment & side effects (2) Review of new medications or change in dosage (2)  I certify that inpatient  services furnished can reasonably be expected to improve the patient's condition.  Sanjuana Kava, PMHNP-BC  4:27 PM 04/19/2013 I agreed with the findings of H&P. I am involved in the treatment Plan.

## 2013-04-19 NOTE — Progress Notes (Signed)
[facility] Group Notes:  (Nursing/MHT/Case Management/Adjunct)  Date:  04/18/2013  Time:  2100  Type of Therapy:  wrap up group  Participation Level:  Minimal  Participation Quality:  Drowsy  Affect:  sleepy  Cognitive:  Appropriate  Insight:  Improving  Engagement in Group:  None  Modes of Intervention:  Clarification, Education and Support  Summary of Progress/Problems: Pt attended group but was unable to stay awake. Pt reported it was because of her medication.   Shelah Lewandowsky 04/19/2013, 6:02 AM

## 2013-04-19 NOTE — Progress Notes (Signed)
Patient did attend the evening speaker AA meeting.  

## 2013-04-19 NOTE — [facility] Group Notes (Signed)
Adult Psychoeducational Group Note   Date: 04/19/2013   Time: 1315   Group Topic/Focus:Healthy Support Systems   Making Healthy Choices: The focus of this group is to help patients identify healthy support systems  .  Participation Level: Active   Participation Quality: Attentive   Affect: Appropriate   Cognitive: Alert   Insight: Improving   Engagement in Group: Engaged   Modes of Intervention: Discussion and Education   Additional Comments  

## 2013-04-19 NOTE — Progress Notes (Signed)
D. Pt has been up and has been active in milieu today, attending various activities. Pt has endorsed feelings of depression and hopelessness. Pt did state that she feels safe while here but is unsure if she can remain safe once discharged. Pt fears that she will be discharged tomorrow and feels that it is too soon, feels medications are not adjusted fully yet as well as not being sure that she could contract for safety once she is discharged. Pt has received all medications without incident. A. Medication education completed, support and encouragement provided. R. Pt verbalized understanding, will continue to monitor.

## 2013-04-20 LAB — TSH: TSH: 3.849 u[IU]/mL (ref 0.350–4.500)

## 2013-04-20 LAB — LITHIUM LEVEL: Lithium Lvl: 0.26 mEq/L — ABNORMAL LOW (ref 0.80–1.40)

## 2013-04-20 MED ORDER — LEVOTHYROXINE SODIUM 125 MCG PO TABS: 125.0000 ug | ORAL_TABLET | Freq: Every day | ORAL | Status: DC

## 2013-04-20 MED ORDER — ADULT MULTIVITAMIN W/MINERALS CH: 1.0000 | ORAL_TABLET | Freq: Every day | ORAL | Status: DC

## 2013-04-20 MED ORDER — GABAPENTIN 300 MG PO CAPS: 600.0000 mg | ORAL_CAPSULE | Freq: Four times a day (QID) | ORAL | Status: DC

## 2013-04-20 MED ORDER — GABAPENTIN 600 MG PO TABS
600.0000 mg | ORAL_TABLET | Freq: Four times a day (QID) | ORAL | Status: DC
  Filled 2013-04-20: qty 56

## 2013-04-20 MED ORDER — ESOMEPRAZOLE MAGNESIUM 40 MG PO CPDR: 40.0000 mg | DELAYED_RELEASE_CAPSULE | Freq: Every day | ORAL | Status: DC

## 2013-04-20 MED ORDER — LITHIUM CARBONATE 300 MG PO CAPS: 300.0000 mg | ORAL_CAPSULE | Freq: Two times a day (BID) | ORAL | Status: DC

## 2013-04-20 MED ORDER — DSS 100 MG PO CAPS: 200.0000 mg | ORAL_CAPSULE | Freq: Two times a day (BID) | ORAL | Status: DC

## 2013-04-20 MED ORDER — LIDOCAINE 5 % EX PTCH: 1.0000 | MEDICATED_PATCH | Freq: Every day | CUTANEOUS | Status: DC

## 2013-04-20 MED ORDER — HALOPERIDOL 2 MG PO TABS: 2.0000 mg | ORAL_TABLET | Freq: Three times a day (TID) | ORAL | Status: DC

## 2013-04-20 MED ORDER — BENZTROPINE MESYLATE 1 MG PO TABS: 1.0000 mg | ORAL_TABLET | Freq: Two times a day (BID) | ORAL | Status: DC

## 2013-04-20 MED ORDER — BUPROPION HCL ER (XL) 300 MG PO TB24: 300.0000 mg | ORAL_TABLET | Freq: Every day | ORAL | Status: DC

## 2013-04-20 NOTE — Progress Notes (Signed)
Adult Psychoeducational Group Note  Date:  04/20/2013 Time:  12:05 PM  Group Topic/Focus:  Wellness Toolbox:   The focus of this group is to discuss various aspects of wellness, balancing those aspects and exploring ways to increase the ability to experience wellness.  Patients will create a wellness toolbox for use upon discharge.  Participation Level:  Active  Participation Quality:  Appropriate, Attentive and Sharing  Affect:  Appropriate  Cognitive:  Alert and Appropriate  Insight: Appropriate  Engagement in Group:  Engaged  Modes of Intervention:  Discussion  Additional Comments:  Pt was appropriate and sharing while attending group. Pt shared that wellness means "Good". Pt also shared that her environmental goal was to find some where to live. Her Financial goal is to find a job that will provide a stable income for her.  Sharyn Lull 04/20/2013, 12:05 PM

## 2013-04-20 NOTE — [facility] Group Notes (Signed)
[facility] LCSW Aftercare Discharge Planning Group Note   04/20/2013 10:30 AM  Participation Quality:  Appropriate   Mood/Affect:  Anxious  Depression Rating:  8  Anxiety Rating:  9  Thoughts of Suicide:  No Will you contract for safety?   NA  Current AVH:  No  Plan for Discharge/Comments:  Pt will d/c today and is going to [facility] in [place] until [facility] admit date of 7/21 (monday). She plans to attend daily AA/NA meetings and go to [facility] to "kill time" during the day. She will follow up at [facility] for med management.   Transportation Means: bus pass  Supports: daughters/limited family support/limited community supports. Pt was set up with Transitional Care team through [facility] and will work with them to get set up for ACT services through [place].  Smart, [employer]

## 2013-04-20 NOTE — [facility] Suicide Risk Assessment (Signed)
Suicide Risk Assessment  Discharge Assessment     Demographic Factors:  Caucasian and Unemployed  Mental Status Per Nursing Assessment::   On Admission:  Suicidal ideation indicated by patient;Suicide plan;Self-harm behaviors  Current Mental Status by Physician: In full contact with reality. There are no suicidal ideas, plans or intent. She is going to stay at a half way house for the next week, waiting for a bed at [facility]. She is anxious about it but understands that she has the resources in place to keep herself from relapsing until she gets to [facility]. Her daughters are picking her up. She is going to ask if her mother would allow her to stay around the house understanding that in one week she is going to be in rehab   Loss Factors: Financial problems/change in socioeconomic status  Historical Factors: NA  Risk Reduction Factors:   Sense of responsibility to family and Positive social support  Continued Clinical Symptoms:  Depression:   Comorbid alcohol abuse/dependence Alcohol/Substance Abuse/Dependencies  Cognitive Features That Contribute To Risk:  Closed-mindedness Polarized thinking Thought constriction (tunnel vision)    Suicide Risk:  Minimal: No identifiable suicidal ideation.  Patients presenting with no risk factors but with morbid ruminations; may be classified as minimal risk based on the severity of the depressive symptoms  Discharge Diagnoses:   AXIS I:  Alcohol Dependence, Major Depression, Hallucinosis, Substance Induced Mood Disorder, Cocaine Abuse AXIS II:  Deferred AXIS III:   Past Medical History  Diagnosis Date  . Peptic ulcer   . Alcohol abuse   . Anemia   . Benzodiazepine abuse   . Depression   . Anxiety   . Thyroid disease   . Alcoholism   . Narcotic abuse   . Back pain   . Thrombocytopenia 06/17/2011  . Hypothyroidism   . Seizures   . Medical history non-contributory     hypoglycemic   AXIS IV:  other psychosocial or environmental  problems AXIS V:  61-70 mild symptoms  Plan Of Care/Follow-up recommendations:  Activity:  as tolerated Diet:  regular Continue the relapse prevention plan Follow up [facility] Is patient on multiple antipsychotic therapies at discharge:  No   Has Patient had three or more failed trials of antipsychotic monotherapy by history:  No  Recommended Plan for Multiple Antipsychotic Therapies: N/A   Jacqueline [name] A 04/20/2013, 12:37 PM

## 2013-04-20 NOTE — Discharge Summary (Signed)
Physician Discharge Summary Note  Patient:  Jacqueline [name] is an 47 y.o., female MRN:  [id number] DOB:  05-27-1966 Patient phone:  [phone] (home)  Patient address:   [place] [address],   Date of Admission:  04/14/2013 Date of Discharge: 04/20/13  Reason for Admission:  Alcohol detox and SI  Discharge Diagnoses: Principal Problem:   Alcohol dependence Active Problems:   Substance induced mood disorder   Hallucinations  Review of Systems  Constitutional: Negative.   HENT: Negative.   Eyes: Negative.   Respiratory: Negative.   Cardiovascular: Negative.   Gastrointestinal: Negative.   Genitourinary: Negative.   Musculoskeletal: Negative.   Skin: Negative.   Neurological: Negative.   Endo/Heme/Allergies: Negative.   Psychiatric/Behavioral: Positive for depression (Stabilized with medication prior to discharge) and substance abuse (Alcoholism). Negative for suicidal ideas, hallucinations and memory loss. The patient is nervous/anxious (Stabilized with medication prior to discharge). The patient does not have insomnia.    Axis Diagnosis:   AXIS I:  Substance Induced Mood Disorder and Alcohol dependence AXIS II:  Deferred AXIS III:   Past Medical History  Diagnosis Date  . Peptic ulcer   . Alcohol abuse   . Anemia   . Benzodiazepine abuse   . Depression   . Anxiety   . Thyroid disease   . Alcoholism   . Narcotic abuse   . Back pain   . Thrombocytopenia 06/17/2011  . Hypothyroidism   . Seizures   . Medical history non-contributory     hypoglycemic   AXIS IV:  economic problems, housing problems, occupational problems, other psychosocial or environmental problems and Alcoholism AXIS V:  60  Level of Care:  [facility]  Hospital Course:  This is a 47 year old Caucasian female. Admitted to [facility] from the [facility] ED with complaints of suicidal ideations with plans to jump in front of traffic. Patient reports, "I was discharged from this hospital 2  weeks ago. I went to [facility] center for treatment. I was there x 1 week. I needed to drink alcohol real bad, So, I checked myself out and I have been drinking since then. I drink 2 bottles of Listerine daily and beer too. I was also thinking about killing myself because I can't kick this habit. I hear voices all the time telling me to go ahead and kill myself. I did not tell anyone about the voices because I don't want people to think that I'm crazy. I drink so much to quiet the voices".  Upon admission into this hospital, and after admission assessment/evaluation coupled with UDS/Toxicology reports (+) for Alcohol, Benzodiazepine and cocaine, it was determined that Ms. Jacqueline [name] will need detoxification treatment protocol to stabilize her system of alcohol/drug intoxication and to combat the withdrawal symptoms of these substance as well. This is her second round of detoxification treatment in less than a month. She was recently discharged from this hospital and sent to [facility] for a 28 days substance abuse treatment. Ms. Jacqueline [name] reported during admission assessment that she voluntarily checked herself out of the her treatment because she wanted to drink alcohol.   Ms. Jacqueline [name] was then started on Librium treatment protocol. She was also enrolled in group counseling sessions and activities as in her previous admissions where she was taught, counseled and learned coping skills that should help her after discharge to cope better and manage her substance abuse issues to sustain a much longer sobriety. She also attended AA/NA meetings being offered and held on  this unit. She has some previously existing and or identifiable medical conditions that required treatment and or monitoring. She received medication management for all those health issues as well. She was monitored closely for any potential problems that may arise as a result of and or during detoxification treatment. Patient tolerated her  treatment regimen and detoxification treatment protocol without any significant adverse effects and or reactions presented.   Patient attended treatment team meeting this am and met with the treatment team members. Her reason for admission, present symptoms, substance abuse issues, response to treatment and discharge plans discussed. Patient endorsed that she is doing well and stable for discharge to pursue the next phase of her substance abuse treatment. It was then agreed upon that she will continue substance abuse treatment at the [facility] on [address] in [place] starting on 04/27/13 at 08:00 am. Ms. Jacqueline [name] is currently being discharged to her place of resident till 04/27/13 when she will go to the [facility] treatment Center. And for medication management/routine psychiatric care, she will follow-up at the [facility] here in [place], [place]. This is a walk-in appointment and patient is made aware of it. The addresses, dates, times and contact information for the [facility] and [facility] treatment center provided for patient in writing.   Besides the treatments received here and scheduled outpatient psychiatric services, patient was ordered and received, Wellbutrin XL 300 mg daily for depression, Benztropine 1 mg twice daily for EPS, Neurontin 400 mg qid for anxiety, Lithium Carbonate 300 mg bid for mood stabilization and Haldol 2 mg tid for mood control. She was also encouraged to join/attend AA/NA meetings offered and held within her community after completing her treatment at [facility]. Upon discharge, patient adamantly denies suicidal, homicidal ideations, auditory, visual hallucinations, delusional thoughts and or withdrawal symptoms. Patient left [facility] with all personal belongings in no apparent distress. She received 2 weeks worth supply samples of his discharge medications provided by [facility] pharmacy. Transportation per [employer]. Bus pass provided for patient.    Consults:  psychiatry  Significant Diagnostic Studies:  labs: CBC with diff, CMP, UDS, Toxicology tests, U/A  Discharge Vitals:   Blood pressure 85/54, pulse 96, temperature 99 F (37.2 C), temperature source Oral, resp. rate 18, height 5\' 8"  (1.727 m), weight 79.379 kg (175 lb), last menstrual period 03/30/2013. Body mass index is 26.61 kg/(m^2). Lab Results:   Results for orders placed during the hospital encounter of 04/14/13 (from the past 72 hour(s))  LITHIUM LEVEL     Status: Abnormal   Collection Time    04/20/13  6:15 AM      Result Value Range   Lithium Lvl 0.26 (*) 0.80 - 1.40 mEq/L  TSH     Status: None   Collection Time    04/20/13  6:15 AM      Result Value Range   TSH 3.849  0.350 - 4.500 uIU/mL    Physical Findings: AIMS: Facial and Oral Movements Muscles of Facial Expression: None, normal Lips and Perioral Area: None, normal Jaw: None, normal Tongue: None, normal,Extremity Movements Upper (arms, wrists, hands, fingers): None, normal Lower (legs, knees, ankles, toes): None, normal, Trunk Movements Neck, shoulders, hips: None, normal, Overall Severity Severity of abnormal movements (highest score from questions above): None, normal Incapacitation due to abnormal movements: None, normal Patient's awareness of abnormal movements (rate only patient's report): No Awareness, Dental Status Current problems with teeth and/or dentures?: No Does patient usually wear dentures?: No  CIWA:  CIWA-Ar Total: 2 COWS:  COWS Total Score: 8  Psychiatric Specialty Exam: See Psychiatric Specialty Exam and Suicide Risk Assessment completed by Attending Physician prior to discharge.  Discharge destination:  Other:  Home, then to [facility]  Is patient on multiple antipsychotic therapies at discharge:  No   Has Patient had three or more failed trials of antipsychotic monotherapy by history:  No  Recommended Plan for Multiple Antipsychotic Therapies: NA      Medication List    STOP taking these medications       busPIRone 10 MG tablet  Commonly known as:  BUSPAR      TAKE these medications     Indication   benztropine 1 MG tablet  Commonly known as:  COGENTIN  Take 1 tablet (1 mg total) by mouth 2 (two) times daily. For prevention of involuntary movements   Indication:  Extrapyramidal Reaction caused by Medications     buPROPion 300 MG 24 hr tablet  Commonly known as:  WELLBUTRIN XL  Take 1 tablet (300 mg total) by mouth daily. For depression   Indication:  Major Depressive Disorder     DSS 100 MG Caps  Take 200 mg by mouth 2 (two) times daily. May purchase from over the counter at yr, local pharmacy: For constipation   Indication:  Constipation     esomeprazole 40 MG capsule  Commonly known as:  NEXIUM  Take 1 capsule (40 mg total) by mouth daily before breakfast. For acid reflux   Indication:  Gastroesophageal Reflux Disease with Current Symptoms     gabapentin 300 MG capsule  Commonly known as:  NEURONTIN  Take 2 capsules (600 mg total) by mouth 4 (four) times daily. For anxiety   Indication:  Agitation, Anxiety     haloperidol 2 MG tablet  Commonly known as:  HALDOL  Take 1 tablet (2 mg total) by mouth 3 (three) times daily. For mood control   Indication:  Psychosis, Mood control     levothyroxine 125 MCG tablet  Commonly known as:  SYNTHROID, LEVOTHROID  Take 1 tablet (125 mcg total) by mouth daily before breakfast. For thyroid hormone replacement   Indication:  Underactive Thyroid     lidocaine 5 %  Commonly known as:  LIDODERM  Place 1 patch onto the skin daily. Remove & Discard patch within 12 hours or as directed by MD: For pain   Indication:  Nerve Pain After Herpes Zoster or Shingles     lithium carbonate 300 MG capsule  Take 1 capsule (300 mg total) by mouth 2 (two) times daily with a meal. For mood stabilization   Indication:  Mood instabilizaqtion     multivitamin with minerals Tabs  Take 1 tablet by  mouth daily. Can be purchased over the counter at the pharmacy: For vitamin supplement   Indication:  Vitamin supplement       Follow-up Information   Follow up with [facility]. (Walk in between 8AM-9AM for hospital follow-up/medication management.)    Contact information:   [address] [place], [place] [address] phone: [phone] fax: [phone]       Follow up with [facility] On 04/27/2013. (Arrive at [facility] by 8AM for admission. Make sure to bring ID, 30 day med supply, and clothes. )    Contact information:   [address] [place], [place] [address] phone: [phone] fax: [phone]     Follow-up recommendations:  Activity:  As tolerated Diet: As recommended by your primary care doctor.  Keep all scheduled follow-up appointments as recommended. Continue to work the relapse prevention plan Comments: Take all your medications as prescribed by your mental healthcare provider. Report any adverse effects and or reactions from your medicines to your outpatient provider promptly. Patient is instructed and cautioned to not engage in alcohol and or illegal drug use while on prescription medicines. In the event of worsening symptoms, patient is instructed to call the crisis hotline, 911 and or go to the nearest ED for appropriate evaluation and treatment of symptoms. Follow-up with your primary care provider for your other medical issues, concerns and or health care needs.   Total Discharge Time:  Greater than 30 minutes.  SignedSanjuana Kava, PMHNP-BC 04/20/2013, 12:09 PM Agree with assessment and plan Reymundo Poll. Dub Mikes, M.D.

## 2013-04-20 NOTE — Tx Team (Signed)
Interdisciplinary Treatment Plan Update (Adult)  Date: 04/20/2013   Time Reviewed: 10:35 AM  Progress in Treatment:  Attending groups: Yes Participating in groups:  Yes Taking medication as prescribed: Yes  Tolerating medication: Yes  Family/Significant othe contact made: Yes. Contact made with Yvonna Alanis, pt's daughter.  Patient understands diagnosis: Yes, AEB seeking treatment for SI, ETOH detox, and depression. Discussing patient identified problems/goals with staff: Yes  Medical problems stabilized or resolved: Yes  Denies suicidal/homicidal ideation: Yes. In group/self report. Patient has not harmed self or Others: Yes  New problem(s) identified:  Discharge Plan or Barriers: Pt will go to [facility] in [place] until [place] admit date of 7/21 (Monday). She will follow up with [facility] for med management and has been set up with the Transitional care team--they will assist pt in getting set up for ACT services through [facility].  Additional comments: Pt resistant to leaving [facility] and is nervous about discharge. She plans to keep herself busy during the day by attending AA/NA meetings and going to the [facility] until her admission date at [facility].  Reason for Continuation of Hospitalization: none. D/c today Estimated length of stay: d/c today  For review of initial/current patient goals, please see plan of care.  Attendees:  Patient:    Family:    Physician: Geoffery Lyons MD 04/20/2013 10:35 AM   Nursing: Maureen Ralphs RN 04/20/2013 10:35 AM   Clinical Social Worker Kelci Petrella Smart, LCSWA  04/20/2013 10:35 AM   Other: Darden Dates Nurse CM  04/20/2013 10:35 AM   Other:  Lupita Leash RN  04/20/2013 10:35 AM   Other: Aggie PA 04/20/2013 10:35 AM   Other:    Scribe for Treatment Team:  [employer] LCSWA 04/20/2013 10:35 AM

## 2013-04-20 NOTE — Progress Notes (Signed)
Patient ID: Jacqueline [name], female   DOB: 1966-06-06, 47 y.o.   MRN: [id number] She has been discharged home and was picked up by her daughters. She voiced understanding of discharge instruction and of follow up plan. She denies thoughts of SI and all her belongings were taken home with her. Teaching was done on her medications and fall risk she voiced understanding.

## 2013-04-20 NOTE — Progress Notes (Signed)
Patient ID: Jacqueline [name], female   DOB: [birth year], 47 y.o.   MRN: [id number] D)  Came to the med window shortly after group, was curled up in a chair, waiting, stated she was tired and ready to go to bed.  Had been medicated for headache, which had been relieved.  Affect flat, sad, tired but compliant.   A)  Will continue to monitor for safety, continue POC R)  Safety maintained.

## 2013-04-20 NOTE — Progress Notes (Signed)
[facility] Adult Case Management Discharge Plan :  Will you be returning to the same living situation after discharge: No. At discharge, do you have transportation home?:Yes,  bus pass Do you have the ability to pay for your medications:Yes,  mental health  Release of information consent forms completed and in the chart;  Patient's signature needed at discharge.  Patient to Follow up at: Follow-up Information   Follow up with [facility]. (Walk in between 8AM-9AM for hospital follow-up/medication management.)    Contact information:   [address], [place] [address] phone: [phone] fax: [phone]       Follow up with [facility] On 04/27/2013. (Arrive at [facility] by 8AM for admission. Make sure to bring ID, 30 day med supply, and clothes. )    Contact information:   [address] [place], [place] [address] phone: [phone] fax: [phone]      Patient denies SI/HI:   Yes,  self report/in group.     Safety Planning and Suicide Prevention discussed:  Yes,  SPE completed with pt's daughter and SPI pamhlet given to pt. She was encouraged to ask questions and discuss any concerns regarding suicide and suicide prevention.   Smart, Benjaman Artman 04/20/2013, 10:33 AM

## 2013-04-22 NOTE — Progress Notes (Signed)
Patient Discharge Instructions:  After Visit Summary (AVS):   Faxed to:  04/22/13 Discharge Summary Note:   Faxed to:  04/22/13 Psychiatric Admission Assessment Note:   Faxed to:  04/22/13 Suicide Risk Assessment - Discharge Assessment:   Faxed to:  04/22/13 Faxed/Sent to the Next Level Care provider:  04/22/13 Faxed to [facility] @ [phone] Faxed to [facility] @ [phone]  Jerelene Redden, 04/22/2013, 2:05 PM

## 2013-05-13 ENCOUNTER — Encounter (HOSPITAL_COMMUNITY): Payer: Self-pay

## 2013-05-13 ENCOUNTER — Telehealth (HOSPITAL_COMMUNITY): Payer: Self-pay | Admitting: Licensed Clinical Social Worker

## 2013-05-13 ENCOUNTER — Encounter (HOSPITAL_COMMUNITY): Payer: Self-pay | Admitting: Intensive Care

## 2013-05-13 ENCOUNTER — Emergency Department (HOSPITAL_COMMUNITY)
Admission: EM | Admit: 2013-05-13 | Discharge: 2013-05-13 | Disposition: A | Discharge status: Other Acute Inpatient Hospital | Payer: Self-pay | Attending: Emergency Medicine | Admitting: Emergency Medicine

## 2013-05-13 ENCOUNTER — Inpatient Hospital Stay (HOSPITAL_COMMUNITY)
Admission: AD | Admit: 2013-05-13 | Discharge: 2013-05-14 | DRG: 897 | Disposition: A | Discharge status: Home / Self Care | Payer: No Typology Code available for payment source | Source: Intra-hospital | Attending: Psychiatry | Admitting: Psychiatry

## 2013-05-13 DIAGNOSIS — F1910 Other psychoactive substance abuse, uncomplicated: Secondary | ICD-10-CM

## 2013-05-13 DIAGNOSIS — F17200 Nicotine dependence, unspecified, uncomplicated: Secondary | ICD-10-CM | POA: Insufficient documentation

## 2013-05-13 DIAGNOSIS — T50992A Poisoning by other drugs, medicaments and biological substances, intentional self-harm, initial encounter: Secondary | ICD-10-CM | POA: Insufficient documentation

## 2013-05-13 DIAGNOSIS — F411 Generalized anxiety disorder: Secondary | ICD-10-CM | POA: Insufficient documentation

## 2013-05-13 DIAGNOSIS — Z79899 Other long term (current) drug therapy: Secondary | ICD-10-CM | POA: Insufficient documentation

## 2013-05-13 DIAGNOSIS — E039 Hypothyroidism, unspecified: Secondary | ICD-10-CM | POA: Insufficient documentation

## 2013-05-13 DIAGNOSIS — T381X1A Poisoning by thyroid hormones and substitutes, accidental (unintentional), initial encounter: Secondary | ICD-10-CM | POA: Insufficient documentation

## 2013-05-13 DIAGNOSIS — F1994 Other psychoactive substance use, unspecified with psychoactive substance-induced mood disorder: Secondary | ICD-10-CM | POA: Insufficient documentation

## 2013-05-13 DIAGNOSIS — F1920 Other psychoactive substance dependence, uncomplicated: Principal | ICD-10-CM | POA: Diagnosis present

## 2013-05-13 DIAGNOSIS — F1020 Alcohol dependence, uncomplicated: Secondary | ICD-10-CM | POA: Insufficient documentation

## 2013-05-13 DIAGNOSIS — Z862 Personal history of diseases of the blood and blood-forming organs and certain disorders involving the immune mechanism: Secondary | ICD-10-CM | POA: Insufficient documentation

## 2013-05-13 DIAGNOSIS — F1310 Sedative, hypnotic or anxiolytic abuse, uncomplicated: Secondary | ICD-10-CM | POA: Insufficient documentation

## 2013-05-13 DIAGNOSIS — F329 Major depressive disorder, single episode, unspecified: Secondary | ICD-10-CM | POA: Insufficient documentation

## 2013-05-13 DIAGNOSIS — G40909 Epilepsy, unspecified, not intractable, without status epilepticus: Secondary | ICD-10-CM | POA: Insufficient documentation

## 2013-05-13 DIAGNOSIS — D649 Anemia, unspecified: Secondary | ICD-10-CM | POA: Insufficient documentation

## 2013-05-13 DIAGNOSIS — F1410 Cocaine abuse, uncomplicated: Secondary | ICD-10-CM | POA: Insufficient documentation

## 2013-05-13 DIAGNOSIS — Z8711 Personal history of peptic ulcer disease: Secondary | ICD-10-CM | POA: Insufficient documentation

## 2013-05-13 DIAGNOSIS — F39 Unspecified mood [affective] disorder: Secondary | ICD-10-CM

## 2013-05-13 DIAGNOSIS — F3289 Other specified depressive episodes: Secondary | ICD-10-CM | POA: Insufficient documentation

## 2013-05-13 DIAGNOSIS — F10220 Alcohol dependence with intoxication, uncomplicated: Secondary | ICD-10-CM

## 2013-05-13 LAB — COMPREHENSIVE METABOLIC PANEL
ALT: 11 U/L (ref 0–35)
AST: 19 U/L (ref 0–37)
Albumin: 3.7 g/dL (ref 3.5–5.2)
Chloride: 100 mEq/L (ref 96–112)
Creatinine, Ser: 0.91 mg/dL (ref 0.50–1.10)
Potassium: 3.6 mEq/L (ref 3.5–5.1)
Sodium: 136 mEq/L (ref 135–145)
Total Bilirubin: <0.1 mg/dL — ABNORMAL LOW (ref 0.3–1.2)

## 2013-05-13 LAB — RAPID URINE DRUG SCREEN, HOSP PERFORMED
Amphetamines: NOT DETECTED
Barbiturates: NOT DETECTED
Benzodiazepines: POSITIVE — AB
Cocaine: POSITIVE — AB

## 2013-05-13 LAB — ACETAMINOPHEN LEVEL: Acetaminophen (Tylenol), Serum: <15 ug/mL (ref 10–30)

## 2013-05-13 LAB — CBC
Platelets: 353 10*3/uL (ref 150–400)
RBC: 3.7 MIL/uL — ABNORMAL LOW (ref 3.87–5.11)
RDW: 17.3 % — ABNORMAL HIGH (ref 11.5–15.5)
WBC: 5.7 10*3/uL (ref 4.0–10.5)

## 2013-05-13 MED ORDER — LOPERAMIDE HCL 2 MG PO CAPS: 2.0000 mg | ORAL_CAPSULE | ORAL | Status: DC | PRN

## 2013-05-13 MED ORDER — CHLORDIAZEPOXIDE HCL 25 MG PO CAPS
25.0000 mg | ORAL_CAPSULE | Freq: Four times a day (QID) | ORAL | Status: DC
  Administered 2013-05-13 – 2013-05-14 (×3): 25 mg via ORAL
  Filled 2013-05-13 (×3): qty 1

## 2013-05-13 MED ORDER — VITAMIN B-1 100 MG PO TABS
100.0000 mg | ORAL_TABLET | Freq: Every day | ORAL | Status: DC
  Administered 2013-05-14: 100 mg via ORAL
  Filled 2013-05-13 (×2): qty 1

## 2013-05-13 MED ORDER — CHLORDIAZEPOXIDE HCL 25 MG PO CAPS
25.0000 mg | ORAL_CAPSULE | Freq: Once | ORAL | Status: AC
  Administered 2013-05-13: 25 mg via ORAL
  Filled 2013-05-13: qty 1

## 2013-05-13 MED ORDER — PANTOPRAZOLE SODIUM 40 MG PO TBEC
40.0000 mg | DELAYED_RELEASE_TABLET | Freq: Every day | ORAL | Status: DC
  Administered 2013-05-13 – 2013-05-14 (×2): 40 mg via ORAL
  Filled 2013-05-13 (×5): qty 1

## 2013-05-13 MED ORDER — ADULT MULTIVITAMIN W/MINERALS CH
1.0000 | ORAL_TABLET | Freq: Every day | ORAL | Status: DC
  Filled 2013-05-13 (×3): qty 1

## 2013-05-13 MED ORDER — ACETAMINOPHEN 325 MG PO TABS: 650.0000 mg | ORAL_TABLET | Freq: Four times a day (QID) | ORAL | Status: DC | PRN

## 2013-05-13 MED ORDER — HYDROXYZINE HCL 25 MG PO TABS
25.0000 mg | ORAL_TABLET | Freq: Four times a day (QID) | ORAL | Status: DC | PRN
Start: 2013-05-13 — End: 2013-05-14

## 2013-05-13 MED ORDER — CHLORDIAZEPOXIDE HCL 25 MG PO CAPS: 25.0000 mg | ORAL_CAPSULE | Freq: Three times a day (TID) | ORAL | Status: DC

## 2013-05-13 MED ORDER — PANTOPRAZOLE SODIUM 40 MG PO TBEC: 40.0000 mg | DELAYED_RELEASE_TABLET | Freq: Every day | ORAL | Status: DC

## 2013-05-13 MED ORDER — CHLORDIAZEPOXIDE HCL 25 MG PO CAPS: 25.0000 mg | ORAL_CAPSULE | Freq: Four times a day (QID) | ORAL | Status: DC | PRN

## 2013-05-13 MED ORDER — CHLORDIAZEPOXIDE HCL 25 MG PO CAPS: 25.0000 mg | ORAL_CAPSULE | Freq: Every day | ORAL | Status: DC

## 2013-05-13 MED ORDER — NICOTINE 21 MG/24HR TD PT24
21.0000 mg | MEDICATED_PATCH | Freq: Every day | TRANSDERMAL | Status: DC
  Administered 2013-05-13 – 2013-05-14 (×2): 21 mg via TRANSDERMAL
  Filled 2013-05-13 (×5): qty 1

## 2013-05-13 MED ORDER — CHLORDIAZEPOXIDE HCL 25 MG PO CAPS: 25.0000 mg | ORAL_CAPSULE | ORAL | Status: DC

## 2013-05-13 MED ORDER — MAGNESIUM HYDROXIDE 400 MG/5ML PO SUSP: 30.0000 mL | Freq: Every day | ORAL | Status: DC | PRN

## 2013-05-13 MED ORDER — ONDANSETRON 4 MG PO TBDP: 4.0000 mg | ORAL_TABLET | Freq: Four times a day (QID) | ORAL | Status: DC | PRN

## 2013-05-13 MED ORDER — THIAMINE HCL 100 MG/ML IJ SOLN: 100.0000 mg | Freq: Once | INTRAMUSCULAR | Status: DC

## 2013-05-13 MED ORDER — PANTOPRAZOLE SODIUM 40 MG PO TBEC
40.0000 mg | DELAYED_RELEASE_TABLET | Freq: Every day | ORAL | Status: DC
  Filled 2013-05-13: qty 1

## 2013-05-13 MED ORDER — ALUM & MAG HYDROXIDE-SIMETH 200-200-20 MG/5ML PO SUSP
30.0000 mL | ORAL | Status: DC | PRN
  Administered 2013-05-13: 30 mL via ORAL

## 2013-05-13 NOTE — Progress Notes (Signed)
CARE MANAGEMENT ED NOTE 05/13/2013  Patient:  Jacqueline [name], Jacqueline [name]   Account Number:  [id number]  Date Initiated:  05/13/2013  Documentation initiated by:  Fiore Detjen  Subjective/Objective Assessment:   ED/CM noted pt has no PCP listed. She is also listed as homeless.     Subjective/Objective Assessment Detail:   Spoke with pt and she states she uses [facility] in [place], and has no other PCP. [place] and [employer] for clinics, food pantries, shelters, and [employer] were given. Referred to [facility], but then cancelled due to pt being D/C to [facility]. Pt appreciative of the information     Action/Plan:   Action/Plan Detail:   Anticipated DC Date:  05/13/2013     Status Recommendation to Physician:   Result of Recommendation:        Choice offered to / List presented to:            Status of service:    ED Comments:   ED Comments Detail:

## 2013-05-13 NOTE — Tx Team (Signed)
Initial Interdisciplinary Treatment Plan  PATIENT STRENGTHS: (choose at least two) Active sense of humor Average or above average intelligence Communication skills  PATIENT STRESSORS: Financial difficulties Marital or family conflict Medication change or noncompliance Occupational concerns Substance abuse   PROBLEM LIST: Problem List/Patient Goals Date to be addressed Date deferred Reason deferred Estimated date of resolution  ETOH Dependence 05/13/13     Psychosis 05/13/13                                                DISCHARGE CRITERIA:  Ability to meet basic life and health needs Adequate post-discharge living arrangements Improved stabilization in mood, thinking, and/or behavior Medical problems require only outpatient monitoring  PRELIMINARY DISCHARGE PLAN: Attend aftercare/continuing care group Outpatient therapy  PATIENT/FAMIILY INVOLVEMENT: This treatment plan has been presented to and reviewed with the patient, Jacqueline [name].  The patient and family have been given the opportunity to ask questions and make suggestions.  Nestor Ramp [facility] 05/13/2013, 3:17 PM

## 2013-05-13 NOTE — Progress Notes (Signed)
Patient admitted to [facility] from [facility]. Patient reports that she is here for "acohol detox." Patient currently endorsing SI but verbally contracts for safety. The patient is positive for auditory hallucinations and reports that the voice is telling her to "commit suicide." The patient denies HI and visual hallucinations. The patient states that she was drinking "way too much" and that she "just wants to die." The patient declined to give substance amounts and date of last use. The patient is labile, tearful and loud during the admission and told RN to "just look up everything from when I was here last." The patient is requesting detox medication and is also requesting for Dr. Dub Mikes to see her. The patient was given education regarding unit rules and regulations and informed that her MD would be Dr. Jannifer Franklin. The patient was given education regarding fall risk status (low). Patient was escorted by RNs to unit. Will continue to monitor patient for safety.

## 2013-05-13 NOTE — ED Notes (Signed)
Pt states her belongings are at a local hotel and will be disposed of if she does not come to pick them up.  Requesting to leave.  Pt states, "I'm not suicidal anymore.  I only did it to piss someone off."  edp notified and IVC papers signed.  Explained this to pt.  Verbalized understanding.  Pt calm at this time.  Sitter at bedside.

## 2013-05-13 NOTE — BH Assessment (Incomplete)
Assessment Note  Jacqueline [name] is an 47 y.o. female.   {Axis Diagnosis:3049000}  Past Medical History:  Past Medical History  Diagnosis Date  . Peptic ulcer   . Alcohol abuse   . Anemia   . Benzodiazepine abuse   . Depression   . Anxiety   . Thyroid disease   . Alcoholism   . Narcotic abuse   . Back pain   . Thrombocytopenia 06/17/2011  . Hypothyroidism   . Seizures   . Medical history non-contributory     hypoglycemic    Past Surgical History  Procedure Laterality Date  . Cholecystectomy    . Abdominal surgery    . Esophagogastroduodenoscopy    . Gastric bypass    . Tubal ligation    . Esophagogastroduodenoscopy N/A 03/23/2013    Procedure: ESOPHAGOGASTRODUODENOSCOPY (EGD);  Surgeon: Meryl Dare, MD;  Location: [facility];  Service: Endoscopy;  Laterality: N/A;  . No past surgeries      gastric bypass    Family History:  Family History  Problem Relation Age of Onset  . Alcohol abuse Father   . Alcoholism Father   . Cancer Other     Social History:  reports that she has been smoking Cigarettes.  She has a 12 pack-year smoking history. She has never used smokeless tobacco. She reports that  drinks alcohol. She reports that she uses illicit drugs (Oxycodone and Cocaine).  Additional Social History:     CIWA:   COWS:    Allergies:  Allergies  Allergen Reactions  . Nsaids Other (See Comments)    G.I. Bleed    Home Medications:  (Not in a hospital admission)  OB/GYN Status:  Patient's last menstrual period was 03/30/2013.                                                         Disposition:     On Site Evaluation by:   Reviewed with Physician:    Melynda Ripple [facility] 05/13/2013 11:09 AM

## 2013-05-13 NOTE — ED Notes (Signed)
Poison Control notified of patient taking Levothyroxine 125 mcg x 28 pills, etoh, possible consumption of abilify 5 mg tabs up to 11 pills that are missing from a bottle that does not have a label saying when it was filled.  Recommendations are to monitor T3-T4 baseline and in 2-3 days, baseline labs, ekg.

## 2013-05-13 NOTE — BHH Group Notes (Signed)
Adult Psychoeducational Group Note  Date:  05/13/2013 Time:  10:15 PM  Group Topic/Focus:  Wrap-Up Group:   The focus of this group is to help patients review their daily goal of treatment and discuss progress on daily workbooks.  Participation Level:  Did Not Attend  Participation Quality:  None  Affect:  None  Cognitive:  None  Insight: None  Engagement in Group:  None  Modes of Intervention:  Discussion  Additional Comments:  Jacqueline [name] did not attend group.  Jacqueline [name] A 05/13/2013, 10:15 PM

## 2013-05-13 NOTE — ED Notes (Signed)
Per Jessie Foot, pt has been accepted to [facility] by Dr. Lucianne Muss Room # [phone].

## 2013-05-13 NOTE — BH Assessment (Signed)
BHH Assessment Progress Note   This clinician attempted twice to assess patient.  Patient is too somnolent to be seen at this time.  Spoke with Dr. Rulon Abide and he agreed on patient being assessed after 10:00.
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
## 2013-05-13 NOTE — ED Notes (Signed)
Pt with apparent overdose of Levothyroxine 125 mcg x 28 pills, also with large amt of alcohol.  Pt admitted to ems that she wanted to kill herself.

## 2013-05-13 NOTE — ED Notes (Signed)
Pt speaking in clear even sentences now, states she had three beers and took 28 synthroid. Denies taking any other medication. Pt wants to use her cell phone to call and let someone know she is here. Pt instructed that she can use her phone later.

## 2013-05-13 NOTE — ED Provider Notes (Signed)
CSN: [id number]     Arrival date & time 05/13/13  0226 History     First MD Initiated Contact with Patient 05/13/13 [phone]     Chief Complaint  Patient presents with  . Drug Overdose   HPI patient presents after taking 28 of her thyroid pills in a suicide attempt.  Patient just released 2 weeks ago from behavioral health, she says since then she's been using cocaine and drinking, and her depression has gotten worse.  She denies any physical complaints at this shortness of breath, chest pain, abdominal pain, nausea, vomiting, diarrhea, headache, fever, chills. Denies any hallucinations. Symptoms of depression are severe, worsening, she's not able to afford her medicines and so she is rationing her Haldol and her lithium.  Past Medical History  Diagnosis Date  . Peptic ulcer   . Alcohol abuse   . Anemia   . Benzodiazepine abuse   . Depression   . Anxiety   . Thyroid disease   . Alcoholism   . Narcotic abuse   . Back pain   . Thrombocytopenia 06/17/2011  . Hypothyroidism   . Seizures   . Medical history non-contributory     hypoglycemic   Past Surgical History  Procedure Laterality Date  . Cholecystectomy    . Abdominal surgery    . Esophagogastroduodenoscopy    . Gastric bypass    . Tubal ligation    . Esophagogastroduodenoscopy N/A 03/23/2013    Procedure: ESOPHAGOGASTRODUODENOSCOPY (EGD);  Surgeon: Meryl Dare, MD;  Location: [facility];  Service: Endoscopy;  Laterality: N/A;  . No past surgeries      gastric bypass   Family History  Problem Relation Age of Onset  . Alcohol abuse Father   . Alcoholism Father   . Cancer Other    History  Substance Use Topics  . Smoking status: Current Every Day Smoker -- 1.00 packs/day for 12 years    Types: Cigarettes    Last Attempt to Quit: 08/08/2011  . Smokeless tobacco: Never Used  . Alcohol Use: Yes     Comment: drink 4 liters a day. 1 case/day drinks Listerine   OB History   Grav Para Term Preterm Abortions TAB SAB Ect  Mult Living   3 3  3      3      Review of Systems At least 10pt or greater review of systems completed and are negative except where specified in the HPI.  Allergies  Nsaids  Home Medications   Current Outpatient Rx  Name  Route  Sig  Dispense  Refill  . benztropine (COGENTIN) 1 MG tablet   Oral   Take 1 tablet (1 mg total) by mouth 2 (two) times daily. For prevention of involuntary movements   60 tablet   0   . buPROPion (WELLBUTRIN XL) 300 MG 24 hr tablet   Oral   Take 1 tablet (300 mg total) by mouth daily. For depression   30 tablet   0   . docusate sodium 100 MG CAPS   Oral   Take 200 mg by mouth 2 (two) times daily. May purchase from over the counter at yr, local pharmacy: For constipation   10 capsule   0   . esomeprazole (NEXIUM) 40 MG capsule   Oral   Take 1 capsule (40 mg total) by mouth daily before breakfast. For acid reflux         . gabapentin (NEURONTIN) 300 MG capsule   Oral   Take 2  capsules (600 mg total) by mouth 4 (four) times daily. For anxiety   120 capsule   0   . haloperidol (HALDOL) 2 MG tablet   Oral   Take 1 tablet (2 mg total) by mouth 3 (three) times daily. For mood control   60 tablet   0   . levothyroxine (SYNTHROID, LEVOTHROID) 125 MCG tablet   Oral   Take 1 tablet (125 mcg total) by mouth daily before breakfast. For thyroid hormone replacement   30 tablet   0   . lidocaine (LIDODERM) 5 %   Transdermal   Place 1 patch onto the skin daily. Remove & Discard patch within 12 hours or as directed by MD: For pain   10 patch   0   . lithium carbonate 300 MG capsule   Oral   Take 1 capsule (300 mg total) by mouth 2 (two) times daily with a meal. For mood stabilization   60 capsule   0   . Multiple Vitamin (MULTIVITAMIN WITH MINERALS) TABS   Oral   Take 1 tablet by mouth daily. Can be purchased over the counter at the pharmacy: For vitamin supplement          BP 97/57  Pulse 66  Temp(Src) 97.7 F (36.5 C) (Oral)   Resp 20  SpO2 97%  LMP 03/30/2013 Physical Exam  Nursing notes reviewed.  Electronic medical record reviewed. VITAL SIGNS:   Filed Vitals:   05/13/13 0238  BP: 97/57  Pulse: 66  Temp: 97.7 F (36.5 C)  TempSrc: Oral  Resp: 20  SpO2: 97%   CONSTITUTIONAL: Awake, oriented, tearful, appears depressed  HENT: Atraumatic, normocephalic, oral mucosa pink and moist, airway patent. Nares patent without drainage. External ears normal. EYES: Conjunctiva clear, EOMI, PERRLA NECK: Trachea midline, non-tender, supple CARDIOVASCULAR: Normal heart rate, Normal rhythm, No murmurs, rubs, gallops PULMONARY/CHEST: Clear to auscultation, no rhonchi, wheezes, or rales. Symmetrical breath sounds. Non-tender. ABDOMINAL: Non-distended, soft, non-tender - no rebound or guarding.  BS normal. NEUROLOGIC: Non-focal, moving all four extremities, no gross sensory or motor deficits. EXTREMITIES: No clubbing, cyanosis, or edema SKIN: Warm, Dry, No erythema, No rash  Psychiatric: Depressed mood, flat affect, mildly irritated, angry, not responding to internal stimuli. ED Course   Procedures (including critical care time)  Date: 05/13/2013  Rate: 69  Rhythm: normal sinus rhythm  QRS Axis: normal  Intervals: Prolonged QT at 492  ST/T Wave abnormalities: normal  Conduction Disutrbances: none  Narrative Interpretation: Normal sinus rhythm with prolonged QTC   Labs Reviewed  CBC - Abnormal; Notable for the following:    RBC 3.70 (*)    Hemoglobin 9.6 (*)    HCT 31.2 (*)    MCH 25.9 (*)    RDW 17.3 (*)    All other components within normal limits  COMPREHENSIVE METABOLIC PANEL - Abnormal; Notable for the following:    Total Bilirubin <0.1 (*)    GFR calc non Af Amer 76 (*)    GFR calc Af Amer 88 (*)    All other components within normal limits  ETHANOL - Abnormal; Notable for the following:    Alcohol, Ethyl (B) 93 (*)    All other components within normal limits  SALICYLATE LEVEL - Abnormal;  Notable for the following:    Salicylate Lvl <2.0 (*)    All other components within normal limits  URINE RAPID DRUG SCREEN (HOSP PERFORMED) - Abnormal; Notable for the following:    Cocaine POSITIVE (*)  Benzodiazepines POSITIVE (*)    All other components within normal limits  LITHIUM LEVEL - Abnormal; Notable for the following:    Lithium Lvl <0.25 (*)    All other components within normal limits  ACETAMINOPHEN LEVEL  GLUCOSE, CAPILLARY   No results found. 1. Substance induced mood disorder   2. Alcohol dependence with intoxication, uncomplicated   3. Cocaine abuse, episodic    MDM  Patient presents for the second time in about 2 weeks for psychiatric purposes, patient again isn't suicidal, last time she was seen at [facility] long she was in traffic trying to kill her self, tonight she says she took 28 thyroxine pills "I thought that would get it done."  Patient has no symptoms of sympathomimetic toxidrome, in fact patient is sleepy. Patient says she's been using cocaine and alcohol, or expected to probably get more sleepy. No coingestants suspected at this time, salicylate and acetaminophen are unremarkable, and as expected cocaine and benzodiazepines are positive on her drug screen and alcohol is a 93. Otherwise labs are unremarkable. She does have some chronic anemia.  Attempted to get a tele-psych evaluation for the patient, her provider evaluated her, she was too somnolent, we will reassess at approximately 10 AM after she's had some sleep to  Jones Skene, MD 05/13/13 [phone]

## 2013-05-13 NOTE — Progress Notes (Addendum)
Pt states she has used a clinic called [facility] as her primary care, in the past.

## 2013-05-13 NOTE — ED Notes (Signed)
Pt requesting nicotine patch.

## 2013-05-13 NOTE — ED Provider Notes (Signed)
Patient was seen overnight for overdose of levothyroxine and depression. She reports that this was a suicide attempt. She has been using multiple other drugs as well. Patient was initially too sleepy for evaluation. This morning she is now awake and alert. She was evaluated by psychiatry and has been accepted for transfer to behavioral health.  Gilda Crease, MD 05/13/13 [phone]

## 2013-05-13 NOTE — ED Notes (Signed)
Updated poison control on patient condition.

## 2013-05-13 NOTE — ED Notes (Signed)
Pt unable to complete telepsych, pt keeps falling asleep

## 2013-05-14 ENCOUNTER — Encounter (HOSPITAL_COMMUNITY): Payer: Self-pay | Admitting: Emergency Medicine

## 2013-05-14 ENCOUNTER — Emergency Department (HOSPITAL_COMMUNITY)
Admission: EM | Admit: 2013-05-14 | Discharge: 2013-05-15 | Disposition: A | Discharge status: Home / Self Care | Payer: No Typology Code available for payment source | Attending: Emergency Medicine | Admitting: Emergency Medicine

## 2013-05-14 ENCOUNTER — Encounter (HOSPITAL_COMMUNITY): Payer: Self-pay | Admitting: Psychiatry

## 2013-05-14 DIAGNOSIS — F3160 Bipolar disorder, current episode mixed, unspecified: Secondary | ICD-10-CM

## 2013-05-14 DIAGNOSIS — R45851 Suicidal ideations: Secondary | ICD-10-CM | POA: Insufficient documentation

## 2013-05-14 DIAGNOSIS — F1010 Alcohol abuse, uncomplicated: Secondary | ICD-10-CM | POA: Insufficient documentation

## 2013-05-14 DIAGNOSIS — Z765 Malingerer [conscious simulation]: Secondary | ICD-10-CM

## 2013-05-14 DIAGNOSIS — Z3202 Encounter for pregnancy test, result negative: Secondary | ICD-10-CM | POA: Insufficient documentation

## 2013-05-14 DIAGNOSIS — F602 Antisocial personality disorder: Secondary | ICD-10-CM

## 2013-05-14 DIAGNOSIS — Z862 Personal history of diseases of the blood and blood-forming organs and certain disorders involving the immune mechanism: Secondary | ICD-10-CM | POA: Insufficient documentation

## 2013-05-14 DIAGNOSIS — F39 Unspecified mood [affective] disorder: Secondary | ICD-10-CM

## 2013-05-14 DIAGNOSIS — E039 Hypothyroidism, unspecified: Secondary | ICD-10-CM | POA: Insufficient documentation

## 2013-05-14 DIAGNOSIS — Z8711 Personal history of peptic ulcer disease: Secondary | ICD-10-CM | POA: Insufficient documentation

## 2013-05-14 DIAGNOSIS — G43909 Migraine, unspecified, not intractable, without status migrainosus: Secondary | ICD-10-CM | POA: Insufficient documentation

## 2013-05-14 DIAGNOSIS — E079 Disorder of thyroid, unspecified: Secondary | ICD-10-CM | POA: Insufficient documentation

## 2013-05-14 DIAGNOSIS — F17200 Nicotine dependence, unspecified, uncomplicated: Secondary | ICD-10-CM | POA: Insufficient documentation

## 2013-05-14 DIAGNOSIS — F1994 Other psychoactive substance use, unspecified with psychoactive substance-induced mood disorder: Secondary | ICD-10-CM

## 2013-05-14 DIAGNOSIS — Z79899 Other long term (current) drug therapy: Secondary | ICD-10-CM | POA: Insufficient documentation

## 2013-05-14 LAB — COMPREHENSIVE METABOLIC PANEL
AST: 18 U/L (ref 0–37)
Albumin: 3.8 g/dL (ref 3.5–5.2)
BUN: 8 mg/dL (ref 6–23)
Calcium: 9.2 mg/dL (ref 8.4–10.5)
Chloride: 101 mEq/L (ref 96–112)
Creatinine, Ser: 0.9 mg/dL (ref 0.50–1.10)
Total Bilirubin: 0.2 mg/dL — ABNORMAL LOW (ref 0.3–1.2)
Total Protein: 7.5 g/dL (ref 6.0–8.3)

## 2013-05-14 LAB — CBC WITH DIFFERENTIAL/PLATELET
Basophils Absolute: 0 10*3/uL (ref 0.0–0.1)
Basophils Relative: 0 % (ref 0–1)
Eosinophils Absolute: 0 10*3/uL (ref 0.0–0.7)
Eosinophils Relative: 0 % (ref 0–5)
HCT: 33.9 % — ABNORMAL LOW (ref 36.0–46.0)
Hemoglobin: 10.3 g/dL — ABNORMAL LOW (ref 12.0–15.0)
MCH: 25.6 pg — ABNORMAL LOW (ref 26.0–34.0)
MCHC: 30.4 g/dL (ref 30.0–36.0)
Monocytes Absolute: 0.9 10*3/uL (ref 0.1–1.0)
Monocytes Relative: 10 % (ref 3–12)
Neutro Abs: 7 10*3/uL (ref 1.7–7.7)
RDW: 17.3 % — ABNORMAL HIGH (ref 11.5–15.5)

## 2013-05-14 LAB — SALICYLATE LEVEL: Salicylate Lvl: <2 mg/dL — ABNORMAL LOW (ref 2.8–20.0)

## 2013-05-14 LAB — ETHANOL: Alcohol, Ethyl (B): 195 mg/dL — ABNORMAL HIGH (ref 0–11)

## 2013-05-14 LAB — RAPID URINE DRUG SCREEN, HOSP PERFORMED
Amphetamines: NOT DETECTED
Benzodiazepines: POSITIVE — AB
Cocaine: POSITIVE — AB
Opiates: NOT DETECTED
Tetrahydrocannabinol: NOT DETECTED

## 2013-05-14 LAB — POCT PREGNANCY, URINE: Preg Test, Ur: NEGATIVE

## 2013-05-14 LAB — TSH: TSH: 0.848 u[IU]/mL (ref 0.350–4.500)

## 2013-05-14 LAB — ACETAMINOPHEN LEVEL: Acetaminophen (Tylenol), Serum: <15 ug/mL (ref 10–30)

## 2013-05-14 LAB — LITHIUM LEVEL: Lithium Lvl: <0.25 meq/L — ABNORMAL LOW (ref 0.80–1.40)

## 2013-05-14 MED ORDER — BUPROPION HCL ER (XL) 300 MG PO TB24: 300.0000 mg | ORAL_TABLET | Freq: Every day | ORAL | Status: DC

## 2013-05-14 MED ORDER — GABAPENTIN 300 MG PO CAPS: 600.0000 mg | ORAL_CAPSULE | Freq: Four times a day (QID) | ORAL | Status: DC

## 2013-05-14 MED ORDER — ALUM & MAG HYDROXIDE-SIMETH 200-200-20 MG/5ML PO SUSP: 30.0000 mL | ORAL | Status: DC | PRN

## 2013-05-14 MED ORDER — ZOLPIDEM TARTRATE 5 MG PO TABS: 5.0000 mg | ORAL_TABLET | Freq: Every evening | ORAL | Status: DC | PRN

## 2013-05-14 MED ORDER — PANTOPRAZOLE SODIUM 40 MG PO TBEC
40.0000 mg | DELAYED_RELEASE_TABLET | Freq: Every day | ORAL | Status: DC
  Administered 2013-05-14 – 2013-05-15 (×2): 40 mg via ORAL
  Filled 2013-05-14 (×2): qty 1

## 2013-05-14 MED ORDER — HALOPERIDOL 2 MG PO TABS: 2.0000 mg | ORAL_TABLET | Freq: Three times a day (TID) | ORAL | Status: DC

## 2013-05-14 MED ORDER — LITHIUM CARBONATE 300 MG PO CAPS
300.0000 mg | ORAL_CAPSULE | Freq: Two times a day (BID) | ORAL | Status: DC
  Administered 2013-05-14 – 2013-05-15 (×2): 300 mg via ORAL
  Filled 2013-05-14 (×2): qty 1

## 2013-05-14 MED ORDER — BENZTROPINE MESYLATE 1 MG PO TABS
1.0000 mg | ORAL_TABLET | Freq: Two times a day (BID) | ORAL | Status: DC
  Administered 2013-05-14 – 2013-05-15 (×2): 1 mg via ORAL
  Filled 2013-05-14 (×2): qty 1

## 2013-05-14 MED ORDER — NICOTINE 21 MG/24HR TD PT24
21.0000 mg | MEDICATED_PATCH | Freq: Every day | TRANSDERMAL | Status: DC
  Administered 2013-05-15: 21 mg via TRANSDERMAL
  Filled 2013-05-14: qty 1

## 2013-05-14 MED ORDER — LORAZEPAM 1 MG PO TABS
1.0000 mg | ORAL_TABLET | Freq: Three times a day (TID) | ORAL | Status: DC | PRN
  Administered 2013-05-14 – 2013-05-15 (×2): 1 mg via ORAL
  Filled 2013-05-14 (×2): qty 1

## 2013-05-14 MED ORDER — ONDANSETRON HCL 4 MG PO TABS: 4.0000 mg | ORAL_TABLET | Freq: Three times a day (TID) | ORAL | Status: DC | PRN

## 2013-05-14 MED ORDER — POTASSIUM CHLORIDE CRYS ER 20 MEQ PO TBCR
40.0000 meq | EXTENDED_RELEASE_TABLET | Freq: Once | ORAL | Status: AC
  Administered 2013-05-14: 40 meq via ORAL
  Filled 2013-05-14: qty 2

## 2013-05-14 MED ORDER — GABAPENTIN 300 MG PO CAPS
600.0000 mg | ORAL_CAPSULE | Freq: Four times a day (QID) | ORAL | Status: DC
  Administered 2013-05-14 – 2013-05-15 (×4): 600 mg via ORAL
  Filled 2013-05-14 (×6): qty 2

## 2013-05-14 MED ORDER — LEVOTHYROXINE SODIUM 125 MCG PO TABS
125.0000 ug | ORAL_TABLET | Freq: Every day | ORAL | Status: DC
  Filled 2013-05-14 (×2): qty 1

## 2013-05-14 MED ORDER — BUPROPION HCL ER (XL) 300 MG PO TB24
300.0000 mg | ORAL_TABLET | Freq: Every day | ORAL | Status: DC
  Administered 2013-05-14 – 2013-05-15 (×2): 300 mg via ORAL
  Filled 2013-05-14 (×2): qty 1

## 2013-05-14 MED ORDER — HALOPERIDOL 2 MG PO TABS
2.0000 mg | ORAL_TABLET | Freq: Three times a day (TID) | ORAL | Status: DC
  Administered 2013-05-14 – 2013-05-15 (×3): 2 mg via ORAL
  Filled 2013-05-14 (×3): qty 1

## 2013-05-14 MED ORDER — LITHIUM CARBONATE 300 MG PO CAPS: 300.0000 mg | ORAL_CAPSULE | Freq: Two times a day (BID) | ORAL | Status: DC

## 2013-05-14 MED ORDER — ZIPRASIDONE MESYLATE 20 MG IM SOLR
10.0000 mg | Freq: Once | INTRAMUSCULAR | Status: AC
  Administered 2013-05-14: 10 mg via INTRAMUSCULAR
  Filled 2013-05-14: qty 20

## 2013-05-14 MED ORDER — LEVOTHYROXINE SODIUM 125 MCG PO TABS: 125.0000 ug | ORAL_TABLET | Freq: Every day | ORAL | Status: DC

## 2013-05-14 MED ORDER — ESOMEPRAZOLE MAGNESIUM 40 MG PO CPDR: 40.0000 mg | DELAYED_RELEASE_CAPSULE | Freq: Every day | ORAL | Status: DC

## 2013-05-14 MED ORDER — BENZTROPINE MESYLATE 1 MG PO TABS: 1.0000 mg | ORAL_TABLET | Freq: Two times a day (BID) | ORAL | Status: DC

## 2013-05-14 NOTE — Discharge Summary (Signed)
Physician Discharge Summary Note  Patient:  Jacqueline [name] is an 47 y.o., female MRN:  [id number] DOB:  06-Sep-1966 Patient phone:  [phone] (home)  Patient address:   [place] [address]   Date of Admission:  05/13/2013 Date of Discharge: 05/14/2013  Discharge Diagnoses: Principal Problem:   Unspecified episodic mood disorder Active Problems:   Polysubstance abuse  Axis Diagnosis:  AXIS I: Polysubstance dependence. Substance induced mood disorder  AXIS II: Cluster B Traits  AXIS III:  Past Medical History   Diagnosis  Date   .  Peptic ulcer    .  Alcohol abuse    .  Anemia    .  Benzodiazepine abuse    .  Thyroid disease    .  Alcoholism    .  Narcotic abuse    .  Back pain    .  Thrombocytopenia  06/17/2011   .  Hypothyroidism    .  Seizures    .  Medical history non-contributory      hypoglycemic    AXIS IV: Psychosocial issues  AXIS V: 61-70 mild symptoms  Level of Care:  OP  Hospital Course:   Patient is 47 year old woman with long history of polysubstance abuse. She presents to the ED yesterday claiming that she attempted suicide by overdosing on a bunch of her thyroid medication. However, she change her story today, saying that she could not remember whether she overdose or not. She however confessed that whatever she did was done to "piss someone off". She got angry, belligerent and became verbally abusive during the interview. She then says "just get me the f$$$$ out of here, I am not suicidal/homicidal, depression, delusional or psychotic." Patient was dsicharged from 300 unit last month and was referred to [facility] for follow up. However, it was reported that she presented to [facility] with a "dirty urine". She denies any physical complaints at this shortness of breath, chest pain, abdominal pain, nausea, vomiting, diarrhea, headache, fever, chills.   While a patient in this hospital, Jacqueline [name] was enrolled in group counseling and activities as  well as received the following medication Current facility-administered medications:acetaminophen (TYLENOL) tablet 650 mg, 650 mg, Oral, Q6H PRN, Fransisca Kaufmann, NP;  alum & mag hydroxide-simeth (MAALOX/MYLANTA) 200-200-20 MG/5ML suspension 30 mL, 30 mL, Oral, Q4H PRN, Fransisca Kaufmann, NP, 30 mL at 05/13/13 1545;  chlordiazePOXIDE (LIBRIUM) capsule 25 mg, 25 mg, Oral, Q6H PRN, Fransisca Kaufmann, NP;  chlordiazePOXIDE (LIBRIUM) capsule 25 mg, 25 mg, Oral, QID, Fransisca Kaufmann, NP, 25 mg at 05/14/13 0802 [START ON 05/15/2013] chlordiazePOXIDE (LIBRIUM) capsule 25 mg, 25 mg, Oral, TID, Fransisca Kaufmann, NP;  Melene Muller ON 05/16/2013] chlordiazePOXIDE (LIBRIUM) capsule 25 mg, 25 mg, Oral, BH-qamhs, Fransisca Kaufmann, NP;  Melene Muller ON 05/17/2013] chlordiazePOXIDE (LIBRIUM) capsule 25 mg, 25 mg, Oral, Daily, Fransisca Kaufmann, NP;  hydrOXYzine (ATARAX/VISTARIL) tablet 25 mg, 25 mg, Oral, Q6H PRN, Fransisca Kaufmann, NP loperamide (IMODIUM) capsule 2-4 mg, 2-4 mg, Oral, PRN, Fransisca Kaufmann, NP;  magnesium hydroxide (MILK OF MAGNESIA) suspension 30 mL, 30 mL, Oral, Daily PRN, Fransisca Kaufmann, NP;  multivitamin with minerals tablet 1 tablet, 1 tablet, Oral, Daily, Fransisca Kaufmann, NP;  nicotine (NICODERM CQ - dosed in mg/24 hours) patch 21 mg, 21 mg, Transdermal, Daily, Rachael Fee, MD, 21 mg at 05/14/13 0802 ondansetron (ZOFRAN-ODT) disintegrating tablet 4 mg, 4 mg, Oral, Q6H PRN, Fransisca Kaufmann, NP;  pantoprazole (PROTONIX) EC tablet 40 mg, 40 mg, Oral, Daily, Kerry Hough, PA-C, 40  mg at 05/14/13 0803;  thiamine (B-1) injection 100 mg, 100 mg, Intramuscular, Once, Fransisca Kaufmann, NP;  thiamine (VITAMIN B-1) tablet 100 mg, 100 mg, Oral, Daily, Fransisca Kaufmann, NP, 100 mg at 05/14/13 0802 Current outpatient prescriptions:benztropine (COGENTIN) 1 MG tablet, Take 1 tablet (1 mg total) by mouth 2 (two) times daily. For prevention of involuntary movements, Disp: 60 tablet, Rfl: 0;  buPROPion (WELLBUTRIN XL) 300 MG 24 hr tablet, Take 1 tablet (300 mg total) by mouth daily. For depression,  Disp: 30 tablet, Rfl: 0 esomeprazole (NEXIUM) 40 MG capsule, Take 1 capsule (40 mg total) by mouth daily before breakfast. For acid reflux, Disp: , Rfl: ;  gabapentin (NEURONTIN) 300 MG capsule, Take 2 capsules (600 mg total) by mouth 4 (four) times daily. For anxiety, Disp: 120 capsule, Rfl: 0;  haloperidol (HALDOL) 2 MG tablet, Take 1 tablet (2 mg total) by mouth 3 (three) times daily. For mood control, Disp: 60 tablet, Rfl: 0 levothyroxine (SYNTHROID, LEVOTHROID) 125 MCG tablet, Take 1 tablet (125 mcg total) by mouth daily before breakfast. For thyroid hormone replacement, Disp: 30 tablet, Rfl: 0;  lithium carbonate 300 MG capsule, Take 1 capsule (300 mg total) by mouth 2 (two) times daily with a meal. For mood stabilization, Disp: 60 capsule, Rfl: 0  Patient very medication seeking upon admission and requesting different medications. Patient became agitated during admission interview with MD when he asked specific questions about her overdose and where the thyroid medications came from. Patient demanded to be released from the hospital and stating "I just came here to make somebody mad." The treatment team decided to discharge the patient.    Jacqueline [name] endorsed that their symptoms were stable. Pt also stated that they are stable for discharge.  In other to control Principal Problem:   Unspecified episodic mood disorder Active Problems:   Polysubstance abuse , they will continue psychiatric care on outpatient basis. They will follow-up at      Follow-up Information   Follow up with [facility]. (Go to your hospital follow up appointment M-F between 8 and 9AM)    Contact information:   [address]  [place]  [336] [phone]    .  In addition they were instructed to take all your medications as prescribed by your mental healthcare provider, to report any adverse effects and or reactions from your medicines to your outpatient provider promptly, patient is instructed and cautioned to not  engage in alcohol and or illegal drug use while on prescription medicines, in the event of worsening symptoms, patient is instructed to call the crisis hotline, 911 and or go to the nearest ED for appropriate evaluation and treatment of symptoms.   Upon discharge, patient adamantly denies suicidal, homicidal ideations, auditory, visual hallucinations and or delusional thinking. They left [facility] with all personal belongings in no apparent distress.  Consults:  See electronic record for details  Significant Diagnostic Studies:  See electronic record for details  Discharge Vitals:   Blood pressure 75/53, pulse 120, temperature 97.4 F (36.3 C), temperature source Oral, resp. rate 18, height 5\' 9"  (1.753 m), weight 85.276 kg (188 lb), last menstrual period 03/30/2013, SpO2 97.00%..  Mental Status Exam: See Mental Status Examination and Suicide Risk Assessment completed by Attending Physician prior to discharge.  Discharge destination:  Home  Is patient on multiple antipsychotic therapies at discharge:  No  Has Patient had three or more failed trials of antipsychotic monotherapy by history: N/A Recommended Plan for Multiple Antipsychotic  Therapies: N/A    Medication List       Indication   benztropine 1 MG tablet  Commonly known as:  COGENTIN  Take 1 tablet (1 mg total) by mouth 2 (two) times daily. For prevention of involuntary movements   Indication:  Extrapyramidal Reaction caused by Medications     buPROPion 300 MG 24 hr tablet  Commonly known as:  WELLBUTRIN XL  Take 1 tablet (300 mg total) by mouth daily. For depression   Indication:  Major Depressive Disorder     esomeprazole 40 MG capsule  Commonly known as:  NEXIUM  Take 1 capsule (40 mg total) by mouth daily before breakfast. For acid reflux   Indication:  Gastroesophageal Reflux Disease with Current Symptoms     gabapentin 300 MG capsule  Commonly known as:  NEURONTIN  Take 2 capsules (600 mg total) by mouth 4 (four)  times daily. For anxiety   Indication:  Agitation, Anxiety     haloperidol 2 MG tablet  Commonly known as:  HALDOL  Take 1 tablet (2 mg total) by mouth 3 (three) times daily. For mood control   Indication:  Psychosis, Mood control     levothyroxine 125 MCG tablet  Commonly known as:  SYNTHROID, LEVOTHROID  Take 1 tablet (125 mcg total) by mouth daily before breakfast. For thyroid hormone replacement   Indication:  Underactive Thyroid     lithium carbonate 300 MG capsule  Take 1 capsule (300 mg total) by mouth 2 (two) times daily with a meal. For mood stabilization   Indication:  Mood instabilizaqtion       Follow-up Information   Follow up with [facility]. (Go to your hospital follow up appointment M-F between 8 and 9AM)    Contact information:   [address]  [place]  [336] [phone]     Follow-up recommendations:   Activities: Resume typical activities Diet: Resume typical diet Tests: none Other: Follow up with outpatient provider and report any side effects to out patient prescriber.  Comments:  Take all your medications as prescribed by your mental healthcare provider. Report any adverse effects and or reactions from your medicines to your outpatient provider promptly. Patient is instructed and cautioned to not engage in alcohol and or illegal drug use while on prescription medicines. In the event of worsening symptoms, patient is instructed to call the crisis hotline, 911 and or go to the nearest ED for appropriate evaluation and treatment of symptoms. Follow-up with your primary care provider for your other medical issues, concerns and or health care needs.  SignedFransisca Kaufmann NP-C 05/14/2013 12:08 PM

## 2013-05-14 NOTE — ED Notes (Signed)
Pt belongings placed in [place] [address], by Will, NT

## 2013-05-14 NOTE — ED Notes (Signed)
Patient arrived to unit requesting detox. Patient states she just left [facility] today and states "I told the doctor I wanted to get the fuck out of here because he said I was just wasting his time and didn't want help". Pt then asking this RN when she would be admitted again. Pt drowsy, closing her eyes while talking during assessment. Pt verbally contracts for safety, denies SI/HI or A/V hallucinations.

## 2013-05-14 NOTE — [facility] Suicide Risk Assessment (Signed)
[facility] INPATIENT:  Family/Significant Other Suicide Prevention Education  Suicide Prevention Education:  Patient Refusal for Family/Significant Other Suicide Prevention Education: The patient Jacqueline [name] has refused to provide written consent for family/significant other to be provided Family/Significant Other Suicide Prevention Education during admission and/or prior to discharge.  Physician notified.  [name] states she has no one who is supportive of her.  Daryel Gerald B 05/14/2013, 10:07 AM

## 2013-05-14 NOTE — ED Notes (Signed)
Found pt drinking hospital supplied purell.  Misty Stanley, [place] and charge faith marie.  When asked pt how much did she drink, pt reports "please don't hate me, it wasn't that much, it was like a quarter" Pt reports grabbing bottle off counter.

## 2013-05-14 NOTE — Progress Notes (Signed)
[facility] Adult Case Management Discharge Plan :  Will you be returning to the same living situation after discharge: No. At discharge, do you have transportation home?:Yes,  bus pass Do you have the ability to pay for your medications:Yes,  mental health  Release of information consent forms completed and in the chart;  Patient's signature needed at discharge.  Patient to Follow up at: Follow-up Information   Follow up with [facility]. (Go to your hospital follow up appointment M-F between 8 and 9AM)    Contact information:   [address]  [place]  [336] [phone]      Patient denies SI/HI:   Yes,  yes    Safety Planning and Suicide Prevention discussed:  Yes,  yes    Jacqueline [name] was admitted last night, and is d/cing today per her request.  She was kicked out of [facility] rehab a week ago and was staying with a friend, and does not know where she will go today.  She is unable to return to the [facility].  States she may go to the [facility] to see if they can help with housing.    Daryel Gerald B 05/14/2013, 10:08 AM

## 2013-05-14 NOTE — ED Notes (Signed)
PER Debra from poison control pt should be monitored until she awakes from ETOH and geodan.  Reports that a T3 and T4 level should be checked in 24hrs.

## 2013-05-14 NOTE — [facility] Suicide Risk Assessment (Signed)
Suicide Risk Assessment  Discharge Assessment     Demographic Factors:  Low socioeconomic status, Unemployed and female  Mental Status Per Nursing Assessment::   On Admission:     Current Mental Status by Physician: Patient denies suicidal ideation, intent or plan Loss Factors: Financial problems/change in socioeconomic status  Historical Factors: Family history of mental illness or substance abuse and Impulsivity  Risk Reduction Factors:   Sense of responsibility to family and Positive therapeutic relationship  Continued Clinical Symptoms:  Alcohol/Substance Abuse/Dependencies  Cognitive Features That Contribute To Risk:  Closed-mindedness    Suicide Risk:  Minimal: No identifiable suicidal ideation.  Patients presenting with no risk factors but with morbid ruminations; may be classified as minimal risk based on the severity of the depressive symptoms  Discharge Diagnoses:   AXIS I:  Polysubstance dependence. Substance induced mood disorder AXIS II:  Cluster B Traits AXIS III:   Past Medical History  Diagnosis Date  . Peptic ulcer   . Alcohol abuse   . Anemia   . Benzodiazepine abuse   . Thyroid disease   . Alcoholism   . Narcotic abuse   . Back pain   . Thrombocytopenia 06/17/2011  . Hypothyroidism   . Seizures   . Medical history non-contributory     hypoglycemic   AXIS IV:  Psychosocial issues AXIS V:  61-70 mild symptoms  Plan Of Care/Follow-up recommendations:  Activity:  as tolerated Diet:  healthy  Tests:  routine  Other:  patient to keep her after care appointment  Is patient on multiple antipsychotic therapies at discharge:  No   Has Patient had three or more failed trials of antipsychotic monotherapy by history:  No  Recommended Plan for Multiple Antipsychotic Therapies: N/A  Jmari Pelc,MD 05/14/2013, 11:02 AM

## 2013-05-14 NOTE — Progress Notes (Signed)
Patient ID: Jacqueline [name], female   DOB: [birth year], 47 y.o.   MRN: [id number] D: Patient in room sleeping on approach. Pt c/o ulcer pain and rated pain as 9 on a 0-10 scale. Pt mood/affect seem depressed. Pt denies any s/s of withdrawals. Pt denies SI/HI/AVH.  Pt encouraged to come to staff with any question or concerns.  Cooperative with assessment. No acute distressed noted at this time.   A: Met with pt 1:1. Medications administered as prescribed. Writer encouraged pt to discuss feelings. Pt encouraged to come to staff with any question or concerns. 15 minutes checks for safety.  R: Patient remains safe. She is complaint with medications and denies any adverse reaction. Continue current POC.

## 2013-05-14 NOTE — H&P (Signed)
Psychiatric Admission Assessment Adult  Patient Identification:  Jacqueline [name] Date of Evaluation:  05/14/2013 Chief Complaint:  "I told them at the ED I was not suicidal, I only did it to piss someone off, please get me th F$$$$ out of here." History of Present Illness:Patient is 47 year old woman with long history of polysubstance abuse. She presents to the ED yesterday claiming that she attempted suicide by overdosing on a bunch of her thyroid medication. However, she change her story today, saying that she could not remember whether she overdose or not. She however confessed that whatever she did was done to "piss someone off". She got angry, belligerent and became verbally abusive during the interview. She then says "just get me the f$$$$ out of here, I am not suicidal/homicidal, depression, delusional or psychotic." Patient was dsicharged from 300 unit last month and was referred to [facility] for follow up. However, it was reported that she presented to [facility] with a "dirty urine".  She denies any physical complaints at this shortness of breath, chest pain, abdominal pain, nausea, vomiting, diarrhea, headache, fever, chills.  Elements:  Location:  [facility] inpatient. Associated Signs/Synptoms: Depression Symptoms:  psychomotor agitation, (Hypo) Manic Symptoms:  Irritable Mood, Anxiety Symptoms:  denies Psychotic Symptoms:  denies PTSD Symptoms: Negative  Psychiatric Specialty Exam: Physical Exam  Psychiatric: Her speech is normal. Judgment and thought content normal. Her affect is angry. She is aggressive. Cognition and memory are normal.    Review of Systems  Constitutional: Negative.   HENT: Negative.   Eyes: Negative.   Respiratory: Negative.   Cardiovascular: Negative.   Gastrointestinal: Negative.   Genitourinary: Negative.   Musculoskeletal: Negative.   Skin: Negative.   Neurological: Negative.   Endo/Heme/Allergies: Negative.   Psychiatric/Behavioral: Positive for substance  abuse.    Blood pressure 75/53, pulse 120, temperature 97.4 F (36.3 C), temperature source Oral, resp. rate 18, height 5\' 9"  (1.753 m), weight 85.276 kg (188 lb), last menstrual period 03/30/2013, SpO2 97.00%.Body mass index is 27.75 kg/(m^2).  General Appearance: Fairly Groomed  Patent attorney::  Good  Speech:  Clear and Coherent  Volume:  Normal  Mood:  Angry  Affect:  Full Range  Thought Process:  Coherent  Orientation:  Full (Time, Place, and Person)  Thought Content:  Negative  Suicidal Thoughts:  No  Homicidal Thoughts:  No  Memory:  intact  Judgement:  Fair  Insight:  marginal  Psychomotor Activity:  Increased  Concentration:  Fair  Recall:  Fair  Akathisia:  No  Handed:  Right  AIMS (if indicated):     Assets:  Communication Skills Desire for Improvement  Sleep:  Number of Hours: 6.5    Past Psychiatric History: Diagnosis:  Hospitalizations:  Outpatient Care:  Substance Abuse Care:  Self-Mutilation:  Suicidal Attempts:  Violent Behaviors:   Past Medical History:   Past Medical History  Diagnosis Date  . Peptic ulcer   . Alcohol abuse   . Anemia   . Benzodiazepine abuse   . Thyroid disease   . Alcoholism   . Narcotic abuse   . Back pain   . Thrombocytopenia 06/17/2011  . Hypothyroidism   . Seizures   . Medical history non-contributory     hypoglycemic    Allergies:   Allergies  Allergen Reactions  . Nsaids Other (See Comments)    G.I. Bleed   PTA Medications: Prescriptions prior to admission  Medication Sig Dispense Refill  . [DISCONTINUED] benztropine (COGENTIN) 1 MG tablet Take 1  tablet (1 mg total) by mouth 2 (two) times daily. For prevention of involuntary movements  60 tablet  0  . [DISCONTINUED] buPROPion (WELLBUTRIN XL) 300 MG 24 hr tablet Take 1 tablet (300 mg total) by mouth daily. For depression  30 tablet  0  . [DISCONTINUED] esomeprazole (NEXIUM) 40 MG capsule Take 1 capsule (40 mg total) by mouth daily before breakfast. For acid  reflux      . [DISCONTINUED] gabapentin (NEURONTIN) 300 MG capsule Take 2 capsules (600 mg total) by mouth 4 (four) times daily. For anxiety  120 capsule  0  . [DISCONTINUED] haloperidol (HALDOL) 2 MG tablet Take 1 tablet (2 mg total) by mouth 3 (three) times daily. For mood control  60 tablet  0  . [DISCONTINUED] levothyroxine (SYNTHROID, LEVOTHROID) 125 MCG tablet Take 1 tablet (125 mcg total) by mouth daily before breakfast. For thyroid hormone replacement  30 tablet  0  . [DISCONTINUED] lithium carbonate 300 MG capsule Take 1 capsule (300 mg total) by mouth 2 (two) times daily with a meal. For mood stabilization  60 capsule  0    Previous Psychotropic Medications:  Medication/Dose                 Substance Abuse History in the last 12 months:  yes  Consequences of Substance Abuse: NA  Social History:  reports that she has been smoking Cigarettes.  She has a 12 pack-year smoking history. She has never used smokeless tobacco. She reports that  drinks alcohol. She reports that she uses illicit drugs (Oxycodone, Cocaine, and Benzodiazepines). Additional Social History:                      Current Place of Residence:   Place of Birth:   Family Members: Marital Status:  Single Children:  Sons:  Daughters: Relationships: Education:  unknown Educational Problems/Performance: Religious Beliefs/Practices: History of Abuse (Emotional/Phsycial/Sexual) Teacher, music History:  None. Legal History: Hobbies/Interests:  Family History:   Family History  Problem Relation Age of Onset  . Alcohol abuse Father   . Alcoholism Father   . Cancer Other     Results for orders placed during the hospital encounter of 05/13/13 (from the past 72 hour(s))  CBC     Status: Abnormal   Collection Time    05/13/13  3:05 AM      Result Value Range   WBC 5.7  4.0 - 10.5 K/uL   RBC 3.70 (*) 3.87 - 5.11 MIL/uL   Hemoglobin 9.6 (*) 12.0 - 15.0 g/dL   HCT 11.9  (*) 14.7 - 46.0 %   MCV 84.3  78.0 - 100.0 fL   MCH 25.9 (*) 26.0 - 34.0 pg   MCHC 30.8  30.0 - 36.0 g/dL   RDW 82.9 (*) 56.2 - 13.0 %   Platelets 353  150 - 400 K/uL  COMPREHENSIVE METABOLIC PANEL     Status: Abnormal   Collection Time    05/13/13  3:05 AM      Result Value Range   Sodium 136  135 - 145 mEq/L   Potassium 3.6  3.5 - 5.1 mEq/L   Chloride 100  96 - 112 mEq/L   CO2 26  19 - 32 mEq/L   Glucose, Bld 88  70 - 99 mg/dL   BUN 7  6 - 23 mg/dL   Creatinine, Ser 8.65  0.50 - 1.10 mg/dL   Calcium 9.4  8.4 - 78.4 mg/dL   Total  Protein 7.6  6.0 - 8.3 g/dL   Albumin 3.7  3.5 - 5.2 g/dL   AST 19  0 - 37 U/L   ALT 11  0 - 35 U/L   Alkaline Phosphatase 72  39 - 117 U/L   Total Bilirubin <0.1 (*) 0.3 - 1.2 mg/dL   GFR calc non Af Amer 76 (*) >90 mL/min   GFR calc Af Amer 88 (*) >90 mL/min   Comment:            The eGFR has been calculated     using the CKD EPI equation.     This calculation has not been     validated in all clinical     situations.     eGFR's persistently     <90 mL/min signify     possible Chronic Kidney Disease.  ETHANOL     Status: Abnormal   Collection Time    05/13/13  3:05 AM      Result Value Range   Alcohol, Ethyl (B) 93 (*) 0 - 11 mg/dL   Comment:            LOWEST DETECTABLE LIMIT FOR     SERUM ALCOHOL IS 11 mg/dL     FOR MEDICAL PURPOSES ONLY  ACETAMINOPHEN LEVEL     Status: None   Collection Time    05/13/13  3:05 AM      Result Value Range   Acetaminophen (Tylenol), Serum <15.0  10 - 30 ug/mL   Comment:            THERAPEUTIC CONCENTRATIONS VARY     SIGNIFICANTLY. A RANGE OF 10-30     ug/mL MAY BE AN EFFECTIVE     CONCENTRATION FOR MANY PATIENTS.     HOWEVER, SOME ARE BEST TREATED     AT CONCENTRATIONS OUTSIDE THIS     RANGE.     ACETAMINOPHEN CONCENTRATIONS     >150 ug/mL AT 4 HOURS AFTER     INGESTION AND >50 ug/mL AT 12     HOURS AFTER INGESTION ARE     OFTEN ASSOCIATED WITH TOXIC     REACTIONS.  SALICYLATE LEVEL      Status: Abnormal   Collection Time    05/13/13  3:05 AM      Result Value Range   Salicylate Lvl <2.0 (*) 2.8 - 20.0 mg/dL  LITHIUM LEVEL     Status: Abnormal   Collection Time    05/13/13  3:05 AM      Result Value Range   Lithium Lvl <0.25 (*) 0.80 - 1.40 mEq/L  URINE RAPID DRUG SCREEN (HOSP PERFORMED)     Status: Abnormal   Collection Time    05/13/13  5:22 AM      Result Value Range   Opiates NONE DETECTED  NONE DETECTED   Cocaine POSITIVE (*) NONE DETECTED   Benzodiazepines POSITIVE (*) NONE DETECTED   Amphetamines NONE DETECTED  NONE DETECTED   Tetrahydrocannabinol NONE DETECTED  NONE DETECTED   Barbiturates NONE DETECTED  NONE DETECTED   Comment:            DRUG SCREEN FOR MEDICAL PURPOSES     ONLY.  IF CONFIRMATION IS NEEDED     FOR ANY PURPOSE, NOTIFY LAB     WITHIN 5 DAYS.                LOWEST DETECTABLE LIMITS     FOR URINE DRUG SCREEN  Drug Class       Cutoff (ng/mL)     Amphetamine      1000     Barbiturate      200     Benzodiazepine   200     Tricyclics       300     Opiates          300     Cocaine          300     THC              50  GLUCOSE, CAPILLARY     Status: None   Collection Time    05/13/13  5:50 AM      Result Value Range   Glucose-Capillary 86  70 - 99 mg/dL   Psychological Evaluations:  Assessment:   AXIS I:  Polysubstance dependence. Substance induced mood disorder AXIS II:  Cluster B Traits AXIS III:   Past Medical History  Diagnosis Date  . Peptic ulcer   . Alcohol abuse   . Anemia   . Benzodiazepine abuse   . Thyroid disease   . Alcoholism   . Narcotic abuse   . Back pain   . Thrombocytopenia 06/17/2011  . Hypothyroidism   . Seizures   . Medical history non-contributory     hypoglycemic   AXIS IV:  other psychosocial or environmental problems, problems related to social environment and problems with primary support group AXIS V:  61-70 mild symptoms  Treatment Plan/Recommendations: 1. Admit for crisis management and  stabilization. 2. Medication management to reduce current symptoms to base line and improve the     patient's overall level of functioning 3. Treat health problems as indicated. 4. Develop treatment plan to decrease risk of relapse upon discharge and the need for     readmission. 5. Psycho-social education regarding relapse prevention and self care. 6. Health care follow up as needed for medical problems. 7. Restart home medications where appropriate. 8. Patient will be discharged as was requested by the patient.  Treatment Plan Summary: Daily contact with patient to assess and evaluate symptoms and progress in treatment Medication management Current Medications:  Current Facility-Administered Medications  Medication Dose Route Frequency Provider Last Rate Last Dose  . acetaminophen (TYLENOL) tablet 650 mg  650 mg Oral Q6H PRN Fransisca Kaufmann, NP      . alum & mag hydroxide-simeth (MAALOX/MYLANTA) 200-200-20 MG/5ML suspension 30 mL  30 mL Oral Q4H PRN Fransisca Kaufmann, NP   30 mL at 05/13/13 1545  . chlordiazePOXIDE (LIBRIUM) capsule 25 mg  25 mg Oral Q6H PRN Fransisca Kaufmann, NP      . chlordiazePOXIDE (LIBRIUM) capsule 25 mg  25 mg Oral QID Fransisca Kaufmann, NP   25 mg at 05/14/13 0802   Followed by  . [START ON 05/15/2013] chlordiazePOXIDE (LIBRIUM) capsule 25 mg  25 mg Oral TID Fransisca Kaufmann, NP       Followed by  . [START ON 05/16/2013] chlordiazePOXIDE (LIBRIUM) capsule 25 mg  25 mg Oral BH-qamhs Fransisca Kaufmann, NP       Followed by  . [START ON 05/17/2013] chlordiazePOXIDE (LIBRIUM) capsule 25 mg  25 mg Oral Daily Fransisca Kaufmann, NP      . hydrOXYzine (ATARAX/VISTARIL) tablet 25 mg  25 mg Oral Q6H PRN Fransisca Kaufmann, NP      . loperamide (IMODIUM) capsule 2-4 mg  2-4 mg Oral PRN Fransisca Kaufmann, NP      . magnesium hydroxide (MILK  OF MAGNESIA) suspension 30 mL  30 mL Oral Daily PRN Fransisca Kaufmann, NP      . multivitamin with minerals tablet 1 tablet  1 tablet Oral Daily Fransisca Kaufmann, NP      . nicotine (NICODERM CQ - dosed  in mg/24 hours) patch 21 mg  21 mg Transdermal Daily Rachael Fee, MD   21 mg at 05/14/13 0802  . ondansetron (ZOFRAN-ODT) disintegrating tablet 4 mg  4 mg Oral Q6H PRN Fransisca Kaufmann, NP      . pantoprazole (PROTONIX) EC tablet 40 mg  40 mg Oral Daily Kerry Hough, PA-C   40 mg at 05/14/13 0803  . thiamine (B-1) injection 100 mg  100 mg Intramuscular Once Fransisca Kaufmann, NP      . thiamine (VITAMIN B-1) tablet 100 mg  100 mg Oral Daily Fransisca Kaufmann, NP   100 mg at 05/14/13 0802    Observation Level/Precautions:  routine  Laboratory:  routine  Psychotherapy:    Medications:    Consultations:    Discharge Concerns:    Estimated LOS:1  Other:     I certify that inpatient services furnished can reasonably be expected to improve the patient's condition.   Tejay Hubert, Octavia Heir 8/7/201410:34 AM

## 2013-05-14 NOTE — ED Notes (Signed)
RN [place] notified that patient requests to go to [facility] and that she would like some ice water with lots of ice. Patient is aware she cannot have ice chips until the dr sees her.

## 2013-05-14 NOTE — ED Provider Notes (Signed)
Care assumed from PA Sanders at shift change. Patient requesting detox, took entire bottle of synthroid followed by a 40 oz beer. Initially told PA Allyne Gee this was a suicide attempt, however currently denying it. Today she has been drinking bottles of listerine. Poison control has been contacted and labs pending. Also uses cocaine. Patient trying to leave ED, agitated. She will be IVC'd as I feel she is a harm to herself. Once sober, evaluation by ACT. Patient discussed with Dr. Micheline Maze who agrees with plan of care. 4:32 PM K 3.2. PO potassium ordered. Home meds ordered. Lithium level below normal. 5:29 PM I spoke with ACT team who will evaluate patient.  Trevor Mace, PA-C 05/17/13 [id number]

## 2013-05-14 NOTE — ED Notes (Signed)
Pt calm and cooperative. Changed into blue scrubs. Charge rn allowed pt to make phone call due to pt being cooperative.

## 2013-05-14 NOTE — ED Notes (Signed)
Bed:WHALA<BR> Expected date:<BR> Expected time:<BR> Means of arrival:<BR> Comments:<BR> ems- ETOH

## 2013-05-14 NOTE — ED Notes (Signed)
Pt is aggressive and agitated. Yelling "i don't want to fucking be here,  You're going to have to force me to change my clothes" Pt started walking out.  Charge faith marie aware and robyn, pa and lisa aware.  Pt given 10mg  of geodon.  Charge faith Hilda Lias, was able deescalate after geodan given.

## 2013-05-14 NOTE — ED Provider Notes (Signed)
CSN: [id number]     Arrival date & time 05/14/13  1435 History     First MD Initiated Contact with Patient 05/14/13 1441     Chief Complaint  Patient presents with  . Alcohol Intoxication   (Consider location/radiation/quality/duration/timing/severity/associated sxs/prior Treatment) The history is provided by the patient and medical records.   The patient is in the ED via EMS for EtOH intoxication, now requesting detox.  Patient was discharged from behavioral health yesterday which she states upset her because she does not feel like she got the appropriate treatment. She proceeded to take an entire bottle of her Synthroid followed by 40 ounces of beer.  Patient states she has been drinking Listerine all day today. Admits to illicit drug use (cocaine) but cannot recall last use.  Patient states she would like to have treatment at [facility] and be sent to [facility] for FU.  Denies any chest pain, SOB, abdominal pain, N/V/D, headaches, fevers, sweats, or chills.  Medical records reviewed-- Pt well known to the ED.  Last note from Dr. Thedore Mins reveals that pt was released from [facility] per her own request.  She was d/c from [facility] last month and [facility] but was not accepted due to "dirty urine."   Past Medical History  Diagnosis Date  . Peptic ulcer   . Alcohol abuse   . Anemia   . Benzodiazepine abuse   . Thyroid disease   . Alcoholism   . Narcotic abuse   . Back pain   . Thrombocytopenia 06/17/2011  . Hypothyroidism   . Seizures   . Medical history non-contributory     hypoglycemic   Past Surgical History  Procedure Laterality Date  . Cholecystectomy    . Abdominal surgery    . Esophagogastroduodenoscopy    . Gastric bypass    . Tubal ligation    . Esophagogastroduodenoscopy N/A 03/23/2013    Procedure: ESOPHAGOGASTRODUODENOSCOPY (EGD);  Surgeon: Meryl Dare, MD;  Location: [facility];  Service: Endoscopy;  Laterality: N/A;  . No past surgeries      gastric bypass    Family History  Problem Relation Age of Onset  . Alcohol abuse Father   . Alcoholism Father   . Cancer Other    History  Substance Use Topics  . Smoking status: Current Every Day Smoker -- 1.00 packs/day for 12 years    Types: Cigarettes    Last Attempt to Quit: 08/08/2011  . Smokeless tobacco: Never Used  . Alcohol Use: Yes     Comment: drink 4 liters a day. 1 case/day drinks Listerine   OB History   Grav Para Term Preterm Abortions TAB SAB Ect Mult Living   3 3  3      3      Review of Systems  Psychiatric/Behavioral:       Detox, OD  All other systems reviewed and are negative.    Allergies  Nsaids  Home Medications   Current Outpatient Rx  Name  Route  Sig  Dispense  Refill  . benztropine (COGENTIN) 1 MG tablet   Oral   Take 1 tablet (1 mg total) by mouth 2 (two) times daily. For prevention of involuntary movements   60 tablet   0   . buPROPion (WELLBUTRIN XL) 300 MG 24 hr tablet   Oral   Take 1 tablet (300 mg total) by mouth daily. For depression   30 tablet   0   . esomeprazole (NEXIUM) 40 MG capsule  Oral   Take 1 capsule (40 mg total) by mouth daily before breakfast. For acid reflux         . gabapentin (NEURONTIN) 300 MG capsule   Oral   Take 2 capsules (600 mg total) by mouth 4 (four) times daily. For anxiety   120 capsule   0   . haloperidol (HALDOL) 2 MG tablet   Oral   Take 1 tablet (2 mg total) by mouth 3 (three) times daily. For mood control   60 tablet   0   . levothyroxine (SYNTHROID, LEVOTHROID) 125 MCG tablet   Oral   Take 1 tablet (125 mcg total) by mouth daily before breakfast. For thyroid hormone replacement   30 tablet   0   . lithium carbonate 300 MG capsule   Oral   Take 1 capsule (300 mg total) by mouth 2 (two) times daily with a meal. For mood stabilization   60 capsule   0    BP 118/87  Pulse 112  Temp(Src) 98.7 F (37.1 C) (Oral)  Resp 20  SpO2 100%  LMP 03/30/2013  Physical Exam  Nursing note and  vitals reviewed. Constitutional: She is oriented to person, place, and time. She appears well-developed and well-nourished. No distress.  Appears intoxicated, smells of Listerine  HENT:  Head: Normocephalic and atraumatic.  Mouth/Throat: Oropharynx is clear and moist.  Eyes: Conjunctivae, EOM and lids are normal. Pupils are equal, round, and reactive to light.  PERRL, dilated  Neck: Normal range of motion. Neck supple.  Cardiovascular: Normal rate, regular rhythm and normal heart sounds.   Pulmonary/Chest: Effort normal and breath sounds normal.  Abdominal: Soft. Bowel sounds are normal. There is no tenderness. There is no guarding.  Musculoskeletal: Normal range of motion.  Neurological: She is alert and oriented to person, place, and time. She has normal strength. She displays no tremor. No cranial nerve deficit or sensory deficit. She displays no seizure activity.  CN grossly intact, moves all extremities appropriately without ataxia, no focal neuro deficits or facial droop appreciated  Skin: Skin is warm and dry.  Psychiatric: Her affect is angry. Her speech is slurred. She is agitated and aggressive. She is not actively hallucinating. Thought content is not delusional. She exhibits a depressed mood. She expresses suicidal ideation. She expresses no homicidal ideation. She expresses suicidal plans. She expresses no homicidal plans.  Mood altering between depressed and angry, flat affect, speech slurred, appears agitated with some aggression; SI with intent, denies HI/AVH    ED Course   Procedures (including critical care time)  Labs Reviewed  CBC WITH DIFFERENTIAL - Abnormal; Notable for the following:    Hemoglobin 10.3 (*)    HCT 33.9 (*)    MCH 25.6 (*)    RDW 17.3 (*)    Platelets 418 (*)    All other components within normal limits  COMPREHENSIVE METABOLIC PANEL  ETHANOL  URINE RAPID DRUG SCREEN (HOSP PERFORMED)  LITHIUM LEVEL  ACETAMINOPHEN LEVEL  SALICYLATE LEVEL  TSH    No results found. No diagnosis found.  MDM   2:59 PM Poison control contacted, spoke with [facility].  Synthroid OD can cause GI upset, may have seizures with severe toxicity.  Sx may be delayed for several days-- may wish to monitor labs for the next 2 days with repeat labs/EKGs.  Agitation or confusion likely.  Severe HTN, hyperthermia, cardiac arrythmias (SVT) are all possible.  If seizures occur, advised to tx with benzos, not dilantin or keppra.  3:49 PM Notified by nursing staff that pt was found drinking hand sanitizer and now states she wants to leave.  Talked with pt, she denies taking the pills and states she was not trying to kill herself.  Per physicians note from [facility], pt did the same thing yesterday.  As of now, pt is in an altered state and is unable to make these descisions for herself. Pt has attempted suicide via OD and is a danger to herself and will be IVC'd.  Labs pending at this time.  Care signed out to PA Albert at shift change.  Garlon Hatchet, PA-C 05/14/13 [phone]

## 2013-05-14 NOTE — ED Notes (Signed)
PER Lisa,PA pt reports taking an entire bottle of thyroid medication.

## 2013-05-14 NOTE — Progress Notes (Addendum)
Patient ID: Jacqueline [name], female   DOB: 1966/06/14, 47 y.o.   MRN: [id number] Patient to be discharged today.  Patient met with MD and become defensive when doctor started asking her questions about her drug use.  Patient then asked, "Just f$$$ing get me out of here."  Patient denies any SI/HI/AVH.  She will be given a bus ticket for transportation.  Patient received all personal belongings.  She did not receive any prescriptions or medication samples.  She was pleasant upon discharge.  She received some pants from the clothes closet.  She is homeless so destination is unknown.  Patient left by bus.

## 2013-05-14 NOTE — Consult Note (Signed)
[facility] Psychiatry Consult   Reason for Consult:  Evaluation for IP psychiatric mgmt Referring Physician:  Micheline Maze MD Jacqueline [name] is an 47 y.o. female.  Assessment: AXIS I:  Bipolar, mixed, Substance Induced Mood Disorder and malingering AXIS II:  Borderline Personality Dis. AXIS III:   Past Medical History  Diagnosis Date  . Peptic ulcer   . Alcohol abuse   . Anemia   . Benzodiazepine abuse   . Thyroid disease   . Alcoholism   . Narcotic abuse   . Back pain   . Thrombocytopenia 06/17/2011  . Hypothyroidism   . Seizures   . Medical history non-contributory     hypoglycemic   AXIS IV:  other psychosocial or environmental problems AXIS V:  11-20 some danger of hurting self or others possible OR occasionally fails to maintain minimal personal hygiene OR gross impairment in communication  Plan:  Recommend psychiatric Inpatient admission when medically cleared.  Subjective:   Jacqueline [name] is a 47 y.o. female patient presenting under IVC orders after a reported SA. Patient reportedly took an O/D of her thyroid medication, a 40 oz beer and OTC Listerine. Patient endorses active SI/SA but denies HI and or AVH. Patient was just D/C from [facility] earlier in the day after becoming belligerent with Dr Dub Mikes when inquiring about her long standing substance abuse. Pt's BAL as of this evening is 195 in addition to UDS positive for cocaine and benzodiazepines. Further subjective findings are not obtainable per the patient at this time due to her current intoxicated state.  HPI:   HPI Elements:     Past Psychiatric History: Past Medical History  Diagnosis Date  . Peptic ulcer   . Alcohol abuse   . Anemia   . Benzodiazepine abuse   . Thyroid disease   . Alcoholism   . Narcotic abuse   . Back pain   . Thrombocytopenia 06/17/2011  . Hypothyroidism   . Seizures   . Medical history non-contributory     hypoglycemic    reports that she has been smoking Cigarettes.  She has a 12 pack-year  smoking history. She has never used smokeless tobacco. She reports that  drinks alcohol. She reports that she uses illicit drugs (Oxycodone, Cocaine, and Benzodiazepines). Family History  Problem Relation Age of Onset  . Alcohol abuse Father   . Alcoholism Father   . Cancer Other            Allergies:   Allergies  Allergen Reactions  . Nsaids Other (See Comments)    G.I. Bleed    Past Psychiatric History: Diagnosis:  Polysubstance abuse, MDD, Bipolar mixed, personality d/o  Hospitalizations:  [facility]  Outpatient Care:  no  Substance Abuse Care:  no  Self-Mutilation:  n/a  Suicidal Attempts:  yes  Violent Behaviors:  no   Objective: Blood pressure 98/59, pulse 112, temperature 98.7 F (37.1 C), temperature source Oral, resp. rate 22, last menstrual period 03/30/2013, SpO2 100.00%.There is no weight on file to calculate BMI. Results for orders placed during the hospital encounter of 05/14/13 (from the past 72 hour(s))  LITHIUM LEVEL     Status: Abnormal   Collection Time    05/14/13  3:02 PM      Result Value Range   Lithium Lvl <0.25 (*) 0.80 - 1.40 mEq/L  CBC WITH DIFFERENTIAL     Status: Abnormal   Collection Time    05/14/13  3:25 PM      Result Value Range  WBC 9.2  4.0 - 10.5 K/uL   RBC 4.02  3.87 - 5.11 MIL/uL   Hemoglobin 10.3 (*) 12.0 - 15.0 g/dL   HCT 16.1 (*) 09.6 - 04.5 %   MCV 84.3  78.0 - 100.0 fL   MCH 25.6 (*) 26.0 - 34.0 pg   MCHC 30.4  30.0 - 36.0 g/dL   RDW 40.9 (*) 81.1 - 91.4 %   Platelets 418 (*) 150 - 400 K/uL   Neutrophils Relative % 77  43 - 77 %   Neutro Abs 7.0  1.7 - 7.7 K/uL   Lymphocytes Relative 13  12 - 46 %   Lymphs Abs 1.2  0.7 - 4.0 K/uL   Monocytes Relative 10  3 - 12 %   Monocytes Absolute 0.9  0.1 - 1.0 K/uL   Eosinophils Relative 0  0 - 5 %   Eosinophils Absolute 0.0  0.0 - 0.7 K/uL   Basophils Relative 0  0 - 1 %   Basophils Absolute 0.0  0.0 - 0.1 K/uL  COMPREHENSIVE METABOLIC PANEL     Status: Abnormal   Collection Time     05/14/13  3:25 PM      Result Value Range   Sodium 137  135 - 145 mEq/L   Potassium 3.2 (*) 3.5 - 5.1 mEq/L   Chloride 101  96 - 112 mEq/L   CO2 20  19 - 32 mEq/L   Glucose, Bld 116 (*) 70 - 99 mg/dL   BUN 8  6 - 23 mg/dL   Creatinine, Ser 7.82  0.50 - 1.10 mg/dL   Calcium 9.2  8.4 - 95.6 mg/dL   Total Protein 7.5  6.0 - 8.3 g/dL   Albumin 3.8  3.5 - 5.2 g/dL   AST 18  0 - 37 U/L   ALT 13  0 - 35 U/L   Alkaline Phosphatase 71  39 - 117 U/L   Total Bilirubin 0.2 (*) 0.3 - 1.2 mg/dL   GFR calc non Af Amer 77 (*) >90 mL/min   GFR calc Af Amer 89 (*) >90 mL/min   Comment:            The eGFR has been calculated     using the CKD EPI equation.     This calculation has not been     validated in all clinical     situations.     eGFR's persistently     <90 mL/min signify     possible Chronic Kidney Disease.  ETHANOL     Status: Abnormal   Collection Time    05/14/13  3:25 PM      Result Value Range   Alcohol, Ethyl (B) 195 (*) 0 - 11 mg/dL   Comment:            LOWEST DETECTABLE LIMIT FOR     SERUM ALCOHOL IS 11 mg/dL     FOR MEDICAL PURPOSES ONLY  ACETAMINOPHEN LEVEL     Status: None   Collection Time    05/14/13  3:25 PM      Result Value Range   Acetaminophen (Tylenol), Serum <15.0  10 - 30 ug/mL   Comment:            THERAPEUTIC CONCENTRATIONS VARY     SIGNIFICANTLY. A RANGE OF 10-30     ug/mL MAY BE AN EFFECTIVE     CONCENTRATION FOR MANY PATIENTS.     HOWEVER, SOME ARE BEST TREATED  AT CONCENTRATIONS OUTSIDE THIS     RANGE.     ACETAMINOPHEN CONCENTRATIONS     >150 ug/mL AT 4 HOURS AFTER     INGESTION AND >50 ug/mL AT 12     HOURS AFTER INGESTION ARE     OFTEN ASSOCIATED WITH TOXIC     REACTIONS.  SALICYLATE LEVEL     Status: Abnormal   Collection Time    05/14/13  3:25 PM      Result Value Range   Salicylate Lvl <2.0 (*) 2.8 - 20.0 mg/dL  URINE RAPID DRUG SCREEN (HOSP PERFORMED)     Status: Abnormal   Collection Time    05/14/13  4:24 PM       Result Value Range   Opiates NONE DETECTED  NONE DETECTED   Cocaine POSITIVE (*) NONE DETECTED   Benzodiazepines POSITIVE (*) NONE DETECTED   Amphetamines NONE DETECTED  NONE DETECTED   Tetrahydrocannabinol NONE DETECTED  NONE DETECTED   Barbiturates NONE DETECTED  NONE DETECTED   Comment:            DRUG SCREEN FOR MEDICAL PURPOSES     ONLY.  IF CONFIRMATION IS NEEDED     FOR ANY PURPOSE, NOTIFY LAB     WITHIN 5 DAYS.                LOWEST DETECTABLE LIMITS     FOR URINE DRUG SCREEN     Drug Class       Cutoff (ng/mL)     Amphetamine      1000     Barbiturate      200     Benzodiazepine   200     Tricyclics       300     Opiates          300     Cocaine          300     THC              50  POCT PREGNANCY, URINE     Status: None   Collection Time    05/14/13  4:27 PM      Result Value Range   Preg Test, Ur NEGATIVE  NEGATIVE   Comment:            THE SENSITIVITY OF THIS     METHODOLOGY IS >24 mIU/mL   Labs are reviewed and are pertinent for ( Bal 195 with UDS positive for cocaine and benzodiazepines)  Current Facility-Administered Medications  Medication Dose Route Frequency Provider Last Rate Last Dose  . alum & mag hydroxide-simeth (MAALOX/MYLANTA) 200-200-20 MG/5ML suspension 30 mL  30 mL Oral PRN Trevor Mace, PA-C      . benztropine (COGENTIN) tablet 1 mg  1 mg Oral BID Trevor Mace, PA-C   1 mg at 05/14/13 2106  . buPROPion (WELLBUTRIN XL) 24 hr tablet 300 mg  300 mg Oral Daily Trevor Mace, PA-C   300 mg at 05/14/13 1702  . gabapentin (NEURONTIN) capsule 600 mg  600 mg Oral QID Trevor Mace, PA-C   600 mg at 05/14/13 2105  . haloperidol (HALDOL) tablet 2 mg  2 mg Oral TID Trevor Mace, PA-C   2 mg at 05/14/13 2106  . [START ON 05/15/2013] levothyroxine (SYNTHROID, LEVOTHROID) tablet 125 mcg  125 mcg Oral QAC breakfast Trevor Mace, PA-C      . lithium carbonate capsule 300 mg  300 mg  Oral BID WC Trevor Mace, PA-C   300 mg at 05/14/13 1703  . LORazepam  (ATIVAN) tablet 1 mg  1 mg Oral Q8H PRN Trevor Mace, PA-C   1 mg at 05/14/13 2106  . nicotine (NICODERM CQ - dosed in mg/24 hours) patch 21 mg  21 mg Transdermal Daily Robyn M Albert, PA-C      . ondansetron [facility]) tablet 4 mg  4 mg Oral Q8H PRN Trevor Mace, PA-C      . pantoprazole (PROTONIX) EC tablet 40 mg  40 mg Oral Daily Trevor Mace, PA-C   40 mg at 05/14/13 1702  . zolpidem (AMBIEN) tablet 5 mg  5 mg Oral QHS PRN Trevor Mace, PA-C       Current Outpatient Prescriptions  Medication Sig Dispense Refill  . benztropine (COGENTIN) 1 MG tablet Take 1 tablet (1 mg total) by mouth 2 (two) times daily. For prevention of involuntary movements  60 tablet  0  . buPROPion (WELLBUTRIN XL) 300 MG 24 hr tablet Take 1 tablet (300 mg total) by mouth daily. For depression  30 tablet  0  . esomeprazole (NEXIUM) 40 MG capsule Take 1 capsule (40 mg total) by mouth daily before breakfast. For acid reflux      . gabapentin (NEURONTIN) 300 MG capsule Take 2 capsules (600 mg total) by mouth 4 (four) times daily. For anxiety  120 capsule  0  . haloperidol (HALDOL) 2 MG tablet Take 1 tablet (2 mg total) by mouth 3 (three) times daily. For mood control  60 tablet  0  . levothyroxine (SYNTHROID, LEVOTHROID) 125 MCG tablet Take 1 tablet (125 mcg total) by mouth daily before breakfast. For thyroid hormone replacement  30 tablet  0  . lithium carbonate 300 MG capsule Take 1 capsule (300 mg total) by mouth 2 (two) times daily with a meal. For mood stabilization  60 capsule  0    Psychiatric Specialty Exam:     Blood pressure 98/59, pulse 112, temperature 98.7 F (37.1 C), temperature source Oral, resp. rate 22, last menstrual period 03/30/2013, SpO2 100.00%.There is no weight on file to calculate BMI.  General Appearance: Bizarre and Disheveled  Eye Contact::  Minimal  Speech:  Slurred  Volume:  Decreased  Mood:  Dysphoric  Affect:  Congruent  Thought Process:  Goal Directed  Orientation:  Full  (Time, Place, and Person)  Thought Content:  NA  Suicidal Thoughts:  Yes.  with intent/plan  Homicidal Thoughts:  No  Memory:  Immediate;   Poor  Judgement:  Impaired  Insight:  Shallow  Psychomotor Activity:  Negative  Concentration:  Poor  Recall:  Negative  Akathisia:  Negative  Handed:  Right  AIMS (if indicated):     Assets:  Others:  none appreciated at this time  Sleep:      Treatment Plan Summary: 1) Patient is requiring crises mgmt, safety and stabilization of drug induced mood d/o, bipolar mixed and personality d/o. reccomend palcement [facility] has exhausted theraputic benefit at [facility] 2) Mgmt of Co-morbid conditions 3) Administration of psychotropics and psychotherapeutic interventions  Jacqueline [name] 05/14/2013 11:01 PM

## 2013-05-14 NOTE — ED Notes (Signed)
1 pt belonging back in locker #26. Bag has clothes and purse in it.

## 2013-05-14 NOTE — [facility] Suicide Risk Assessment (Signed)
Suicide Risk Assessment  Admission Assessment     Nursing information obtained from:    Demographic factors:    Current Mental Status:    Loss Factors:    Historical Factors:    Risk Reduction Factors:     CLINICAL FACTORS:   Alcohol/Substance Abuse/Dependencies Previous Psychiatric Diagnoses and Treatments  COGNITIVE FEATURES THAT CONTRIBUTE TO RISK:  Closed-mindedness    SUICIDE RISK:   Minimal: No identifiable suicidal ideation.  Patients presenting with no risk factors but with morbid ruminations; may be classified as minimal risk based on the severity of the depressive symptoms  PLAN OF CARE:1. Admit for crisis management and stabilization. 2. Medication management to reduce current symptoms to base line and improve the     patient's overall level of functioning 3. Treat health problems as indicated. 4. Develop treatment plan to decrease risk of relapse upon discharge and the need for     readmission. 5. Psycho-social education regarding relapse prevention and self care. 6. Health care follow up as needed for medical problems. 7. Restart home medications where appropriate.   I certify that inpatient services furnished can reasonably be expected to improve the patient's condition.  Brittaney Beaulieu,MD 05/14/2013, 10:33 AM

## 2013-05-14 NOTE — ED Notes (Signed)
PER EMS- pt picked up from [address] on [address], with c/o ETOH, pt requesting detox.  Pt was discharged from [facility].  Pt alert and oriented.  Pt reports drinking half bottle of listerin. Pt alert and oriented and ambulatory on arrival.. Denies pain.

## 2013-05-15 DIAGNOSIS — F319 Bipolar disorder, unspecified: Secondary | ICD-10-CM

## 2013-05-15 DIAGNOSIS — F1020 Alcohol dependence, uncomplicated: Secondary | ICD-10-CM

## 2013-05-15 NOTE — Progress Notes (Signed)
Per discussion with psychiatrist and np patient psychiatrically stable for discharge. Patient interested in outpatient resource, and residential substance abuse treatment.  Per Jacqueline [name] was in treatment in July 2014, and is not elligible to return at this time. Patient will be elligible in 90 days. . CSW also provided patient with outpatient resources. Patient interested in [facility], however wait list for treatment bed, and advised for patient to continue to call to see if available treatment bed in the morning.   Catha Gosselin, LCSW [phone]  ED CSW .05/15/2013 1313pm

## 2013-05-15 NOTE — ED Provider Notes (Signed)
Pt stable for discharge to outpatient resource per psych team  Celene Kras, MD 05/15/13 1322

## 2013-05-15 NOTE — Progress Notes (Signed)
Patient Identification:  Jacqueline [name] Date of Evaluation:  05/15/2013   History of Present Illness: Discharged at her request from inpatient unit yesterday came back this am seeking treatment for alcohol dependence.  Patient states she cannot stop drinking despite several treatment and detox.  Patient is homeless and has a history of bipolar disorder but is not taking her medication.  Patient states she has been to every facility around [facility] and still she relapses as soon as she gets out.  Patient reports no support from her family and friends due to her excessive drinking.  Patient denied SI/HI/AVH.  She reported poor sleep and good appetite.    Past Psychiatric History:  Bipolar d/o, alcohol dependence   Past Medical History:     Past Medical History  Diagnosis Date  . Peptic ulcer   . Alcohol abuse   . Anemia   . Benzodiazepine abuse   . Thyroid disease   . Alcoholism   . Narcotic abuse   . Back pain   . Thrombocytopenia 06/17/2011  . Hypothyroidism   . Seizures   . Medical history non-contributory     hypoglycemic       Past Surgical History  Procedure Laterality Date  . Cholecystectomy    . Abdominal surgery    . Esophagogastroduodenoscopy    . Gastric bypass    . Tubal ligation    . Esophagogastroduodenoscopy N/A 03/23/2013    Procedure: ESOPHAGOGASTRODUODENOSCOPY (EGD);  Surgeon: Meryl Dare, MD;  Location: [facility];  Service: Endoscopy;  Laterality: N/A;  . No past surgeries      gastric bypass    Allergies:  Allergies  Allergen Reactions  . Nsaids Other (See Comments)    G.I. Bleed    Current Medications:  Prior to Admission medications   Medication Sig Start Date End Date Taking? Authorizing Provider  benztropine (COGENTIN) 1 MG tablet Take 1 tablet (1 mg total) by mouth 2 (two) times daily. For prevention of involuntary movements 05/14/13  Yes Fransisca Kaufmann, NP  buPROPion (WELLBUTRIN XL) 300 MG 24 hr tablet Take 1 tablet (300 mg total) by mouth daily.  For depression 05/14/13  Yes Fransisca Kaufmann, NP  esomeprazole (NEXIUM) 40 MG capsule Take 1 capsule (40 mg total) by mouth daily before breakfast. For acid reflux 05/14/13  Yes Fransisca Kaufmann, NP  gabapentin (NEURONTIN) 300 MG capsule Take 2 capsules (600 mg total) by mouth 4 (four) times daily. For anxiety 05/14/13  Yes Fransisca Kaufmann, NP  haloperidol (HALDOL) 2 MG tablet Take 1 tablet (2 mg total) by mouth 3 (three) times daily. For mood control 05/14/13  Yes Fransisca Kaufmann, NP  levothyroxine (SYNTHROID, LEVOTHROID) 125 MCG tablet Take 1 tablet (125 mcg total) by mouth daily before breakfast. For thyroid hormone replacement 05/14/13  Yes Fransisca Kaufmann, NP  lithium carbonate 300 MG capsule Take 1 capsule (300 mg total) by mouth 2 (two) times daily with a meal. For mood stabilization 05/14/13  Yes Fransisca Kaufmann, NP    Social History:    reports that she has been smoking Cigarettes.  She has a 12 pack-year smoking history. She has never used smokeless tobacco. She reports that  drinks alcohol. She reports that she uses illicit drugs (Oxycodone, Cocaine, and Benzodiazepines).   Family History:    Family History  Problem Relation Age of Onset  . Alcohol abuse Father   . Alcoholism Father   . Cancer Other     Mental Status Examination/Evaluation:  Alert, oriented x3, disheveled female  who appeared her stated age. Calm and cooperative.  Reported hx of bipolar depressed but not taking medications. She reported her mood to be depressed and her affect was congruent with her mood.  Her concentration and attention was coherent, her thought process and content was normal.  Patient denied SI/HI/AVH.   Her insight and judgement is poor.   DIAGNOSIS:   AXIS I   Bipolar d/o, Alcohol dependence  AXIS II  Deffered  AXIS III See medical notes.  AXIS IV housing problems, occupational problems, other psychosocial or environmental problems, problems related to social environment and problems with primary support group  AXIS V 61-70  mild symptoms     Assessment/Plan:  Face to face interview and consult with Dr Lucianne Muss Patient to be referred to [facility] refused patient because of a recent visit in June Patient opted to leave and was discharged.   Discussed ways options with patient in length, patient reports that she's been in every treatment facility, has left at times without completing the program but does agree that she needs some long-term substance abuse treatment.

## 2013-05-15 NOTE — Progress Notes (Signed)
[facility] provided patient with a list of primary care resources, highlighting [facility] and the [facility].

## 2013-05-16 DIAGNOSIS — F315 Bipolar disorder, current episode depressed, severe, with psychotic features: Secondary | ICD-10-CM | POA: Insufficient documentation

## 2013-05-16 DIAGNOSIS — F1920 Other psychoactive substance dependence, uncomplicated: Secondary | ICD-10-CM | POA: Insufficient documentation

## 2013-05-17 NOTE — ED Provider Notes (Signed)
Medical screening examination/treatment/procedure(s) were conducted as a shared visit with non-physician practitioner(s) and myself.  I personally evaluated the patient during the encounter   IVC paperwork filled by myself as pt reported suicide attempt upon arrival. She denies this now, but I feel she is a danger to herself.    Shanna Cisco, MD 05/17/13 [phone]

## 2013-05-18 DIAGNOSIS — T381X1A Poisoning by thyroid hormones and substitutes, accidental (unintentional), initial encounter: Secondary | ICD-10-CM | POA: Insufficient documentation

## 2013-05-18 DIAGNOSIS — F10939 Alcohol use, unspecified with withdrawal, unspecified: Secondary | ICD-10-CM | POA: Insufficient documentation

## 2013-05-18 NOTE — Discharge Summary (Signed)
Seen and agreed. Imraan Wendell, MD 

## 2013-05-18 NOTE — ED Provider Notes (Signed)
Medical screening examination/treatment/procedure(s) were performed by non-physician practitioner and as supervising physician I was immediately available for consultation/collaboration.   Celene Kras, MD 05/18/13 [phone]

## 2013-05-19 NOTE — Progress Notes (Signed)
Patient Discharge Instructions:  After Visit Summary (AVS):   Faxed to:  05/19/13 Discharge Summary Note:   Faxed to:  05/19/13 Psychiatric Admission Assessment Note:   Faxed to:  05/19/13 Suicide Risk Assessment - Discharge Assessment:   Faxed to:  05/19/13 Faxed/Sent to the Next Level Care provider:  05/19/13 Faxed to [facility] @ [phone]  Jerelene Redden, 05/19/2013, 4:06 PM

## 2013-05-26 NOTE — Consult Note (Signed)
  Medical screening examination/treatment/procedure(s) were performed by non-physician practitioner and as supervising physician I was immediately available for consultation/collaboration.     

## 2013-08-01 ENCOUNTER — Encounter (HOSPITAL_COMMUNITY): Payer: Self-pay | Admitting: Emergency Medicine

## 2013-08-01 ENCOUNTER — Emergency Department (HOSPITAL_COMMUNITY)
Admission: EM | Admit: 2013-08-01 | Discharge: 2013-08-01 | Discharge status: Against Medical Advice | Payer: Self-pay | Attending: Emergency Medicine | Admitting: Emergency Medicine

## 2013-08-01 ENCOUNTER — Emergency Department (HOSPITAL_COMMUNITY): Payer: Self-pay

## 2013-08-01 ENCOUNTER — Other Ambulatory Visit: Payer: Self-pay

## 2013-08-01 DIAGNOSIS — F10929 Alcohol use, unspecified with intoxication, unspecified: Secondary | ICD-10-CM

## 2013-08-01 DIAGNOSIS — Z8669 Personal history of other diseases of the nervous system and sense organs: Secondary | ICD-10-CM | POA: Insufficient documentation

## 2013-08-01 DIAGNOSIS — Z3202 Encounter for pregnancy test, result negative: Secondary | ICD-10-CM | POA: Insufficient documentation

## 2013-08-01 DIAGNOSIS — Z79899 Other long term (current) drug therapy: Secondary | ICD-10-CM | POA: Insufficient documentation

## 2013-08-01 DIAGNOSIS — Z8711 Personal history of peptic ulcer disease: Secondary | ICD-10-CM | POA: Insufficient documentation

## 2013-08-01 DIAGNOSIS — F1020 Alcohol dependence, uncomplicated: Secondary | ICD-10-CM | POA: Insufficient documentation

## 2013-08-01 DIAGNOSIS — F17200 Nicotine dependence, unspecified, uncomplicated: Secondary | ICD-10-CM | POA: Insufficient documentation

## 2013-08-01 DIAGNOSIS — E039 Hypothyroidism, unspecified: Secondary | ICD-10-CM | POA: Insufficient documentation

## 2013-08-01 DIAGNOSIS — Z862 Personal history of diseases of the blood and blood-forming organs and certain disorders involving the immune mechanism: Secondary | ICD-10-CM | POA: Insufficient documentation

## 2013-08-01 LAB — PREGNANCY, URINE: Preg Test, Ur: NEGATIVE

## 2013-08-01 LAB — CBC WITH DIFFERENTIAL/PLATELET
Basophils Absolute: 0 10*3/uL (ref 0.0–0.1)
Basophils Relative: 1 % (ref 0–1)
Eosinophils Absolute: 0.1 10*3/uL (ref 0.0–0.7)
Eosinophils Relative: 2 % (ref 0–5)
MCH: 27.1 pg (ref 26.0–34.0)
MCV: 86.3 fL (ref 78.0–100.0)
Neutrophils Relative %: 63 % (ref 43–77)
Platelets: 382 10*3/uL (ref 150–400)
RDW: 16.1 % — ABNORMAL HIGH (ref 11.5–15.5)

## 2013-08-01 LAB — COMPREHENSIVE METABOLIC PANEL
ALT: 26 U/L (ref 0–35)
AST: 47 U/L — ABNORMAL HIGH (ref 0–37)
Alkaline Phosphatase: 61 U/L (ref 39–117)
CO2: 19 mEq/L (ref 19–32)
Calcium: 9.1 mg/dL (ref 8.4–10.5)
Chloride: 103 mEq/L (ref 96–112)
GFR calc non Af Amer: 71 mL/min — ABNORMAL LOW (ref >90)
Potassium: 3.2 mEq/L — ABNORMAL LOW (ref 3.5–5.1)
Sodium: 138 mEq/L (ref 135–145)
Total Bilirubin: 0.2 mg/dL — ABNORMAL LOW (ref 0.3–1.2)

## 2013-08-01 LAB — RAPID URINE DRUG SCREEN, HOSP PERFORMED: Opiates: NOT DETECTED

## 2013-08-01 LAB — ETHANOL: Alcohol, Ethyl (B): 257 mg/dL — ABNORMAL HIGH (ref 0–11)

## 2013-08-01 LAB — LITHIUM LEVEL: Lithium Lvl: <0.25 mEq/L — ABNORMAL LOW (ref 0.80–1.40)

## 2013-08-01 MED ORDER — SODIUM CHLORIDE 0.9 % IV BOLUS (SEPSIS)
1000.0000 mL | Freq: Once | INTRAVENOUS | Status: AC
  Administered 2013-08-01: 1000 mL via INTRAVENOUS

## 2013-08-01 MED ORDER — ONDANSETRON HCL 4 MG/2ML IJ SOLN
4.0000 mg | Freq: Once | INTRAMUSCULAR | Status: AC
  Administered 2013-08-01: 4 mg via INTRAVENOUS
  Filled 2013-08-01: qty 2

## 2013-08-01 NOTE — ED Notes (Signed)
Reported that [facility] instructed pt here due to ingested one whole bottle of store brand mouthwash, pt hx of ETOH abuse, pt vomiting

## 2013-08-01 NOTE — ED Provider Notes (Signed)
CSN: [id number]     Arrival date & time 08/01/13  1522 History   None   Scribed for No att. providers found, the patient was seen in room APOTF/OTF. This chart was scribed by Lewanda Rife, ED scribe. Patient's care was started at 7:04 PM  Chief Complaint  Patient presents with  . Alcohol Intoxication   (Consider location/radiation/quality/duration/timing/severity/associated sxs/prior Treatment) The history is provided by the patient and the EMS personnel. No language interpreter was used.   Level 5 caveat applies due to intoxication and altered mental status. HPI Comments: Jacqueline [name] is a 47 y.o. female brought in by ambulance, who presents to the Emergency Department with a known hx of alcohol abuse complaining of mouthwash intoxication onset PTA. EMS reports she was found lying on the side walk. EMS reports she is a recovering alcoholic. Denies associated fall, head injury, any trauma, and suicidal ideations. Reports a hx of delirium tremens. Denies other illicit ingestion.   Past Medical History  Diagnosis Date  . Peptic ulcer   . Alcohol abuse   . Anemia   . Benzodiazepine abuse   . Thyroid disease   . Alcoholism   . Narcotic abuse   . Back pain   . Thrombocytopenia 06/17/2011  . Hypothyroidism   . Seizures   . Medical history non-contributory     hypoglycemic   Past Surgical History  Procedure Laterality Date  . Cholecystectomy    . Abdominal surgery    . Esophagogastroduodenoscopy    . Gastric bypass    . Tubal ligation    . Esophagogastroduodenoscopy N/A 03/23/2013    Procedure: ESOPHAGOGASTRODUODENOSCOPY (EGD);  Surgeon: Meryl Dare, MD;  Location: [facility];  Service: Endoscopy;  Laterality: N/A;  . No past surgeries      gastric bypass   Family History  Problem Relation Age of Onset  . Alcohol abuse Father   . Alcoholism Father   . Cancer Other    History  Substance Use Topics  . Smoking status: Current Every Day Smoker -- 1.00 packs/day  for 12 years    Types: Cigarettes    Last Attempt to Quit: 08/08/2011  . Smokeless tobacco: Never Used  . Alcohol Use: Yes     Comment: drink 4 liters a day. 1 case/day drinks Listerine   OB History   Grav Para Term Preterm Abortions TAB SAB Ect Mult Living   3 3  3      3      Review of Systems  Unable to perform ROS: Mental status change    Allergies  Nsaids  Home Medications   Current Outpatient Rx  Name  Route  Sig  Dispense  Refill  . FLUoxetine (PROZAC) 40 MG capsule   Oral   Take 40 mg by mouth daily.         Marland Kitchen levothyroxine (SYNTHROID, LEVOTHROID) 125 MCG tablet   Oral   Take 1 tablet (125 mcg total) by mouth daily before breakfast. For thyroid hormone replacement   30 tablet   0    BP 100/62  Pulse 76  Resp 21  SpO2 91%  LMP 07/18/2013 Physical Exam  Nursing note and vitals reviewed. Constitutional: She is oriented to person, place, and time. She appears well-developed and well-nourished. No distress.  Smells of alcohol.   HENT:  Head: Normocephalic and atraumatic.  Eyes: EOM are normal.  Pupils dilated bilaterally 7 mm reactive   Neck: Neck supple. No tracheal deviation present.  Cardiovascular: Normal rate.  Pulmonary/Chest: Effort normal. No respiratory distress.  Abdominal: Soft.  No evidence of trauma  Musculoskeletal: Normal range of motion.  Moving all extremities, no evidence of trauma   Neurological: She is alert and oriented to person, place, and time.  Skin: Skin is warm and dry.  Psychiatric: She has a normal mood and affect. Her behavior is normal.    ED Course  Procedures (including critical care time) COORDINATION OF CARE:  Nursing notes reviewed. Vital signs reviewed. Initial pt interview and examination performed.   3:20 PM Poison control called  7:04 PM-Discussed work up plan with pt at bedside, which includes EKG, Lactic acid, lithium level, CBG, CBC with diff panel, ethanol, UDS, urine pregnancy, CMP, acetaminophen  level, and salicylate level. Pt agrees with plan.   Treatment plan initiated: Medications  sodium chloride 0.9 % bolus 1,000 mL (0 mLs Intravenous Stopped 08/01/13 1624)  ondansetron (ZOFRAN) injection 4 mg (4 mg Intravenous Given 08/01/13 1555)  sodium chloride 0.9 % bolus 1,000 mL (0 mLs Intravenous Stopped 08/01/13 1739)     Initial diagnostic testing ordered.    Labs Review Labs Reviewed  CBC WITH DIFFERENTIAL - Abnormal; Notable for the following:    RBC 3.73 (*)    Hemoglobin 10.1 (*)    HCT 32.2 (*)    RDW 16.1 (*)    All other components within normal limits  ETHANOL - Abnormal; Notable for the following:    Alcohol, Ethyl (B) 257 (*)    All other components within normal limits  COMPREHENSIVE METABOLIC PANEL - Abnormal; Notable for the following:    Potassium 3.2 (*)    Glucose, Bld 107 (*)    AST 47 (*)    Total Bilirubin 0.2 (*)    GFR calc non Af Amer 71 (*)    GFR calc Af Amer 83 (*)    All other components within normal limits  SALICYLATE LEVEL - Abnormal; Notable for the following:    Salicylate Lvl <2.0 (*)    All other components within normal limits  LITHIUM LEVEL - Abnormal; Notable for the following:    Lithium Lvl <0.25 (*)    All other components within normal limits  LACTIC ACID, PLASMA - Abnormal; Notable for the following:    Lactic Acid, Venous 3.0 (*)    All other components within normal limits  URINE RAPID DRUG SCREEN (HOSP PERFORMED)  PREGNANCY, URINE  ACETAMINOPHEN LEVEL  GLUCOSE, CAPILLARY   Imaging Review Dg Chest Portable 1 View  08/01/2013   CLINICAL DATA:  Lethargy, weakness, confusion  EXAM: PORTABLE CHEST - 1 VIEW  COMPARISON:  None.  FINDINGS: The heart size and mediastinal contours are within normal limits. Both lungs are clear. The visualized skeletal structures are unremarkable.  IMPRESSION: No active disease.   Electronically Signed   By: Esperanza Heir M.D.   On: 08/01/2013 17:36    EKG Interpretation     Ventricular  Rate:  76 PR Interval:  154 QRS Duration: 98 QT Interval:  426 QTC Calculation: 479 R Axis:   69 Text Interpretation:  Normal sinus rhythm Normal ECG No significant change was found            MDM   1. Alcohol intoxication    Known history of alcohol abuse presenting with acute intoxication mouthwash. Denies suicidal intent. Denies any trauma. Found laying on the sidewalk. She is dry heaving. Denies SI or HI. Patient is oriented x3 to person, place and situation.  Mouthwash contains 0.06% methyl  salicylate.  D/w Poison center.  Suspect amount is miniscule, will check ASA. Salicylate level undetectable. Alcohol 257. Anion gap 16. Patient remains somnolent but arousable, protecting airway. We'll continue IV hydration, recheck chemistry and await sobriety.   Patient is intoxicated but oriented. She is able to ambulatory without assistance. She refuses head CT stating she did not hit her head. Will need recheck of chemistry and ASA level.  Informed by nursing staff that patient walked out of the department without assistance.  She pulled out her IV.  She was not able to be located in the ED or parking lot.  [employer] contacted.  Patient denied suicidality or homicidality on initial assessment.  Spoke with Misty Stanley of [employer]. Patient was located but refuses to come back to hospital.  She does not meet IVC criteria as she is not suicidal or an immediate threat to herself or others. Police indicated they would escort patient to her place of residence. She needs supervision by a responsible adult while she is intoxicated.  I personally performed the services described in this documentation, which was scribed in my presence. The recorded information has been reviewed and is accurate.    Glynn Octave, MD 08/01/13 Ebony Cargo

## 2013-08-01 NOTE — ED Notes (Signed)
Pt refuses CT scan, EDP made aware

## 2013-08-01 NOTE — ED Notes (Addendum)
EDP spoke with [employer] and stated that they have found pt and pt refuses to come back, EDP instructed [employer] to escort her to her halfway home, IVC not needed due to pt with no SI or HI during stay here

## 2013-08-01 NOTE — ED Notes (Signed)
Another nurse noted that pt had dressed and walked out of dept. Edp made aware, unable to find pt in parking lot, instructed to have pt come back due to intoxication, pt had pulled IV out prior to leaving, [employer] called to locate pt

## 2013-08-02 ENCOUNTER — Encounter (HOSPITAL_COMMUNITY): Payer: Self-pay | Admitting: Emergency Medicine

## 2013-08-02 ENCOUNTER — Emergency Department (HOSPITAL_COMMUNITY)
Admission: EM | Admit: 2013-08-02 | Discharge: 2013-08-02 | Disposition: A | Discharge status: Home / Self Care | Payer: Self-pay | Attending: Emergency Medicine | Admitting: Emergency Medicine

## 2013-08-02 DIAGNOSIS — Z8711 Personal history of peptic ulcer disease: Secondary | ICD-10-CM | POA: Insufficient documentation

## 2013-08-02 DIAGNOSIS — Z8669 Personal history of other diseases of the nervous system and sense organs: Secondary | ICD-10-CM | POA: Insufficient documentation

## 2013-08-02 DIAGNOSIS — F10229 Alcohol dependence with intoxication, unspecified: Secondary | ICD-10-CM | POA: Insufficient documentation

## 2013-08-02 DIAGNOSIS — F17200 Nicotine dependence, unspecified, uncomplicated: Secondary | ICD-10-CM | POA: Insufficient documentation

## 2013-08-02 DIAGNOSIS — E039 Hypothyroidism, unspecified: Secondary | ICD-10-CM | POA: Insufficient documentation

## 2013-08-02 DIAGNOSIS — Z862 Personal history of diseases of the blood and blood-forming organs and certain disorders involving the immune mechanism: Secondary | ICD-10-CM | POA: Insufficient documentation

## 2013-08-02 DIAGNOSIS — Z79899 Other long term (current) drug therapy: Secondary | ICD-10-CM | POA: Insufficient documentation

## 2013-08-02 DIAGNOSIS — F1010 Alcohol abuse, uncomplicated: Secondary | ICD-10-CM

## 2013-08-02 LAB — BASIC METABOLIC PANEL
BUN: 5 mg/dL — ABNORMAL LOW (ref 6–23)
CO2: 23 mEq/L (ref 19–32)
Calcium: 8.6 mg/dL (ref 8.4–10.5)
Chloride: 103 mEq/L (ref 96–112)
Glucose, Bld: 92 mg/dL (ref 70–99)
Potassium: 3.6 mEq/L (ref 3.5–5.1)

## 2013-08-02 NOTE — ED Notes (Signed)
Pt reporting she has drunk "about a bottle of Listerine" over the course of 3-4 hours.

## 2013-08-02 NOTE — ED Notes (Signed)
Pt resting quietly.  Respirations regular, even and unlabored.    

## 2013-08-02 NOTE — ED Notes (Signed)
Patient states she wants detox; denies suicidal or homicidal ideations.  Patient states her last ETOH was 30 minutes ago.

## 2013-08-02 NOTE — ED Notes (Signed)
Spoke with Onalee Hua with [employer]. Recommendation received to recheck salicylate level about 0500 to ensure result isn't trending up.  No other recommendations.

## 2013-08-02 NOTE — ED Provider Notes (Signed)
CSN: [id number]     Arrival date & time 08/02/13  0136 History   First MD Initiated Contact with Patient 08/02/13 0150     Chief Complaint  Patient presents with  . V70.1    The history is provided by the patient.  pt presents for alcohol intoxication and requesting "Detox" She reports she started drinking ETOH again yesterday after feeling anxious She reports that she has been drinking listerine to get drunk She denies any co-ingestants She reports h/o seizures in the past but none recently No fever/vomitng/cp/sob.  No falls/head injury reported Her course is worsening Nothing improves her symptoms  Past Medical History  Diagnosis Date  . Peptic ulcer   . Alcohol abuse   . Anemia   . Benzodiazepine abuse   . Thyroid disease   . Alcoholism   . Narcotic abuse   . Back pain   . Thrombocytopenia 06/17/2011  . Hypothyroidism   . Seizures   . Medical history non-contributory     hypoglycemic   Past Surgical History  Procedure Laterality Date  . Cholecystectomy    . Abdominal surgery    . Esophagogastroduodenoscopy    . Gastric bypass    . Tubal ligation    . Esophagogastroduodenoscopy N/A 03/23/2013    Procedure: ESOPHAGOGASTRODUODENOSCOPY (EGD);  Surgeon: Meryl Dare, MD;  Location: [facility];  Service: Endoscopy;  Laterality: N/A;  . No past surgeries      gastric bypass   Family History  Problem Relation Age of Onset  . Alcohol abuse Father   . Alcoholism Father   . Cancer Other    History  Substance Use Topics  . Smoking status: Current Every Day Smoker -- 1.00 packs/day for 12 years    Types: Cigarettes    Last Attempt to Quit: 08/08/2011  . Smokeless tobacco: Never Used  . Alcohol Use: Yes     Comment: drink 4 liters a day. 1 case/day drinks Listerine   OB History   Grav Para Term Preterm Abortions TAB SAB Ect Mult Living   3 3  3      3      Review of Systems  Constitutional: Negative for fever.  Respiratory: Negative for shortness of breath.    Gastrointestinal: Negative for vomiting.  Neurological: Positive for headaches.       Mild headache   Psychiatric/Behavioral: Negative for suicidal ideas.  All other systems reviewed and are negative.    Allergies  Nsaids  Home Medications   Current Outpatient Rx  Name  Route  Sig  Dispense  Refill  . FLUoxetine (PROZAC) 40 MG capsule   Oral   Take 40 mg by mouth daily.         Marland Kitchen levothyroxine (SYNTHROID, LEVOTHROID) 125 MCG tablet   Oral   Take 1 tablet (125 mcg total) by mouth daily before breakfast. For thyroid hormone replacement   30 tablet   0    LMP 07/18/2013 BP 106/58  Pulse 58  Temp(Src) 97.8 F (36.6 C) (Oral)  Resp 16  SpO2 97%  LMP 07/18/2013  Physical Exam CONSTITUTIONAL: disheveled, no distress noted.   HEAD: Normocephalic/atraumatic, no signs of trauma noted EYES: EOMI/PERRL ENMT: Mucous membranes moist NECK: supple no meningeal signs SPINE:entire spine nontender CV: S1/S2 noted, no murmurs/rubs/gallops noted LUNGS: Lungs are clear to auscultation bilaterally, no apparent distress ABDOMEN: soft, nontender, no rebound or guarding NEURO: Pt is awake/alert, moves all extremitiesx4, no tremor noted EXTREMITIES: pulses normal, full ROM SKIN: warm, color normal  PSYCH: no abnormalities of mood noted  ED Course  Procedures (including critical care time) Labs Review Labs Reviewed  SALICYLATE LEVEL  ETHANOL  BASIC METABOLIC PANEL  ACETAMINOPHEN LEVEL     EKG Interpretation   None       2:29 AM Pt seen on 10/25 for ETOH intox as well as mouthwash ingestion It contains small amt of methyl salicylate, and at that time she had normal ASA level Will recheck ASA/electrolytes Pt does not appear to be in acute withdrawal and she reports this is the first time she has drank ETOH in weeks She currently denies SI  3:03 AM No anion gap noted ASA level not toxic Pt resting comfortably 5:57 AM Pt sleeping ,no distress, no signs of active  withdrawal Advised to avoid drinking listerine.  Outpatient resources given Stable for d/c home  MDM  No diagnosis found. Nursing notes including past medical history and social history reviewed and considered in documentation Previous records reviewed and considered Labs/vital reviewed and considered     Joya Gaskins, MD 08/02/13 [phone]

## 2013-08-03 ENCOUNTER — Inpatient Hospital Stay (HOSPITAL_COMMUNITY)
Admission: AD | Admit: 2013-08-03 | Discharge: 2013-08-11 | DRG: 897 | Disposition: A | Discharge status: Home / Self Care | Payer: Federal, State, Local not specified - Other | Source: Intra-hospital | Attending: Psychiatry | Admitting: Psychiatry

## 2013-08-03 ENCOUNTER — Encounter (HOSPITAL_COMMUNITY): Payer: Self-pay | Admitting: *Deleted

## 2013-08-03 ENCOUNTER — Emergency Department (HOSPITAL_COMMUNITY)
Admission: EM | Admit: 2013-08-03 | Discharge: 2013-08-03 | Disposition: A | Discharge status: Other Acute Inpatient Hospital | Payer: Self-pay | Attending: Emergency Medicine | Admitting: Emergency Medicine

## 2013-08-03 ENCOUNTER — Encounter (HOSPITAL_COMMUNITY): Payer: Self-pay | Admitting: Emergency Medicine

## 2013-08-03 DIAGNOSIS — B9689 Other specified bacterial agents as the cause of diseases classified elsewhere: Secondary | ICD-10-CM

## 2013-08-03 DIAGNOSIS — K921 Melena: Secondary | ICD-10-CM

## 2013-08-03 DIAGNOSIS — F1410 Cocaine abuse, uncomplicated: Secondary | ICD-10-CM

## 2013-08-03 DIAGNOSIS — D696 Thrombocytopenia, unspecified: Secondary | ICD-10-CM

## 2013-08-03 DIAGNOSIS — D649 Anemia, unspecified: Secondary | ICD-10-CM

## 2013-08-03 DIAGNOSIS — F10932 Alcohol use, unspecified with withdrawal with perceptual disturbance: Secondary | ICD-10-CM

## 2013-08-03 DIAGNOSIS — R748 Abnormal levels of other serum enzymes: Secondary | ICD-10-CM

## 2013-08-03 DIAGNOSIS — F1010 Alcohol abuse, uncomplicated: Secondary | ICD-10-CM | POA: Insufficient documentation

## 2013-08-03 DIAGNOSIS — F1020 Alcohol dependence, uncomplicated: Secondary | ICD-10-CM

## 2013-08-03 DIAGNOSIS — N390 Urinary tract infection, site not specified: Secondary | ICD-10-CM

## 2013-08-03 DIAGNOSIS — R51 Headache: Secondary | ICD-10-CM | POA: Insufficient documentation

## 2013-08-03 DIAGNOSIS — Z862 Personal history of diseases of the blood and blood-forming organs and certain disorders involving the immune mechanism: Secondary | ICD-10-CM | POA: Insufficient documentation

## 2013-08-03 DIAGNOSIS — R45851 Suicidal ideations: Secondary | ICD-10-CM | POA: Insufficient documentation

## 2013-08-03 DIAGNOSIS — Z8711 Personal history of peptic ulcer disease: Secondary | ICD-10-CM | POA: Insufficient documentation

## 2013-08-03 DIAGNOSIS — E039 Hypothyroidism, unspecified: Secondary | ICD-10-CM

## 2013-08-03 DIAGNOSIS — F332 Major depressive disorder, recurrent severe without psychotic features: Secondary | ICD-10-CM | POA: Diagnosis present

## 2013-08-03 DIAGNOSIS — F10239 Alcohol dependence with withdrawal, unspecified: Principal | ICD-10-CM | POA: Diagnosis present

## 2013-08-03 DIAGNOSIS — F32A Depression, unspecified: Secondary | ICD-10-CM

## 2013-08-03 DIAGNOSIS — F39 Unspecified mood [affective] disorder: Secondary | ICD-10-CM

## 2013-08-03 DIAGNOSIS — F10939 Alcohol use, unspecified with withdrawal, unspecified: Principal | ICD-10-CM | POA: Diagnosis present

## 2013-08-03 DIAGNOSIS — F329 Major depressive disorder, single episode, unspecified: Secondary | ICD-10-CM

## 2013-08-03 DIAGNOSIS — R443 Hallucinations, unspecified: Secondary | ICD-10-CM

## 2013-08-03 DIAGNOSIS — F4310 Post-traumatic stress disorder, unspecified: Secondary | ICD-10-CM

## 2013-08-03 DIAGNOSIS — F411 Generalized anxiety disorder: Secondary | ICD-10-CM | POA: Diagnosis present

## 2013-08-03 DIAGNOSIS — F10232 Alcohol dependence with withdrawal with perceptual disturbance: Secondary | ICD-10-CM

## 2013-08-03 DIAGNOSIS — K283 Acute gastrojejunal ulcer without hemorrhage or perforation: Secondary | ICD-10-CM

## 2013-08-03 DIAGNOSIS — Z79899 Other long term (current) drug therapy: Secondary | ICD-10-CM | POA: Insufficient documentation

## 2013-08-03 DIAGNOSIS — F1994 Other psychoactive substance use, unspecified with psychoactive substance-induced mood disorder: Secondary | ICD-10-CM

## 2013-08-03 DIAGNOSIS — K279 Peptic ulcer, site unspecified, unspecified as acute or chronic, without hemorrhage or perforation: Secondary | ICD-10-CM

## 2013-08-03 DIAGNOSIS — F17200 Nicotine dependence, unspecified, uncomplicated: Secondary | ICD-10-CM | POA: Insufficient documentation

## 2013-08-03 DIAGNOSIS — F10229 Alcohol dependence with intoxication, unspecified: Secondary | ICD-10-CM

## 2013-08-03 DIAGNOSIS — F419 Anxiety disorder, unspecified: Secondary | ICD-10-CM

## 2013-08-03 DIAGNOSIS — F1910 Other psychoactive substance abuse, uncomplicated: Secondary | ICD-10-CM

## 2013-08-03 DIAGNOSIS — F410 Panic disorder [episodic paroxysmal anxiety] without agoraphobia: Secondary | ICD-10-CM

## 2013-08-03 DIAGNOSIS — Z8669 Personal history of other diseases of the nervous system and sense organs: Secondary | ICD-10-CM | POA: Insufficient documentation

## 2013-08-03 DIAGNOSIS — F1021 Alcohol dependence, in remission: Secondary | ICD-10-CM | POA: Insufficient documentation

## 2013-08-03 LAB — COMPREHENSIVE METABOLIC PANEL
AST: 32 U/L (ref 0–37)
Albumin: 3.8 g/dL (ref 3.5–5.2)
BUN: 8 mg/dL (ref 6–23)
Chloride: 100 mEq/L (ref 96–112)
Creatinine, Ser: 0.68 mg/dL (ref 0.50–1.10)
GFR calc Af Amer: >90 mL/min (ref >90)
GFR calc non Af Amer: >90 mL/min (ref >90)
Glucose, Bld: 102 mg/dL — ABNORMAL HIGH (ref 70–99)
Potassium: 3.5 mEq/L (ref 3.5–5.1)
Total Bilirubin: 0.2 mg/dL — ABNORMAL LOW (ref 0.3–1.2)

## 2013-08-03 LAB — CBC WITH DIFFERENTIAL/PLATELET
Basophils Absolute: 0 10*3/uL (ref 0.0–0.1)
Eosinophils Absolute: 0.1 10*3/uL (ref 0.0–0.7)
Eosinophils Relative: 1 % (ref 0–5)
HCT: 32 % — ABNORMAL LOW (ref 36.0–46.0)
Hemoglobin: 10.2 g/dL — ABNORMAL LOW (ref 12.0–15.0)
Lymphocytes Relative: 16 % (ref 12–46)
Lymphs Abs: 1.1 10*3/uL (ref 0.7–4.0)
MCH: 27.3 pg (ref 26.0–34.0)
MCHC: 31.9 g/dL (ref 30.0–36.0)
MCV: 85.6 fL (ref 78.0–100.0)
Monocytes Absolute: 0.4 10*3/uL (ref 0.1–1.0)
Monocytes Relative: 6 % (ref 3–12)
Platelets: 399 10*3/uL (ref 150–400)
RBC: 3.74 MIL/uL — ABNORMAL LOW (ref 3.87–5.11)
WBC: 6.6 10*3/uL (ref 4.0–10.5)

## 2013-08-03 LAB — SALICYLATE LEVEL: Salicylate Lvl: <2 mg/dL — ABNORMAL LOW (ref 2.8–20.0)

## 2013-08-03 LAB — URINALYSIS, ROUTINE W REFLEX MICROSCOPIC
Glucose, UA: NEGATIVE mg/dL
Leukocytes, UA: NEGATIVE
Nitrite: NEGATIVE
Specific Gravity, Urine: 1.025 (ref 1.005–1.030)
Urobilinogen, UA: 0.2 mg/dL (ref 0.0–1.0)
pH: 5.5 (ref 5.0–8.0)

## 2013-08-03 LAB — ETHANOL: Alcohol, Ethyl (B): 259 mg/dL — ABNORMAL HIGH (ref 0–11)

## 2013-08-03 LAB — RAPID URINE DRUG SCREEN, HOSP PERFORMED
Amphetamines: NOT DETECTED
Benzodiazepines: NOT DETECTED
Tetrahydrocannabinol: NOT DETECTED

## 2013-08-03 MED ORDER — VITAMIN B-1 100 MG PO TABS
100.0000 mg | ORAL_TABLET | Freq: Every day | ORAL | Status: DC
  Administered 2013-08-04 – 2013-08-10 (×6): 100 mg via ORAL
  Filled 2013-08-03 (×10): qty 1

## 2013-08-03 MED ORDER — PANTOPRAZOLE SODIUM 40 MG PO TBEC
40.0000 mg | DELAYED_RELEASE_TABLET | Freq: Two times a day (BID) | ORAL | Status: DC
  Administered 2013-08-03 – 2013-08-10 (×15): 40 mg via ORAL
  Filled 2013-08-03 (×6): qty 1
  Filled 2013-08-03: qty 14
  Filled 2013-08-03 (×9): qty 1
  Filled 2013-08-03: qty 14
  Filled 2013-08-03 (×5): qty 1

## 2013-08-03 MED ORDER — LOPERAMIDE HCL 2 MG PO CAPS: 2.0000 mg | ORAL_CAPSULE | ORAL | Status: AC | PRN

## 2013-08-03 MED ORDER — ENSURE COMPLETE PO LIQD
237.0000 mL | Freq: Every day | ORAL | Status: DC
  Administered 2013-08-04 – 2013-08-07 (×3): 237 mL via ORAL

## 2013-08-03 MED ORDER — FLUOXETINE HCL 40 MG PO CAPS
40.0000 mg | ORAL_CAPSULE | Freq: Every day | ORAL | Status: DC
  Administered 2013-08-03 – 2013-08-05 (×4): 40 mg via ORAL
  Filled 2013-08-03 (×5): qty 1

## 2013-08-03 MED ORDER — PHENAZOPYRIDINE HCL 200 MG PO TABS
200.0000 mg | ORAL_TABLET | Freq: Three times a day (TID) | ORAL | Status: AC
  Administered 2013-08-03 – 2013-08-06 (×9): 200 mg via ORAL
  Filled 2013-08-03 (×3): qty 1
  Filled 2013-08-03: qty 2
  Filled 2013-08-03 (×3): qty 1
  Filled 2013-08-03: qty 2
  Filled 2013-08-03 (×3): qty 1

## 2013-08-03 MED ORDER — LEVOTHYROXINE SODIUM 125 MCG PO TABS
125.0000 ug | ORAL_TABLET | Freq: Every day | ORAL | Status: DC
  Administered 2013-08-04 – 2013-08-11 (×8): 125 ug via ORAL
  Filled 2013-08-03: qty 1
  Filled 2013-08-03: qty 14
  Filled 2013-08-03 (×7): qty 1

## 2013-08-03 MED ORDER — ADULT MULTIVITAMIN W/MINERALS CH
ORAL_TABLET | ORAL | Status: AC
  Administered 2013-08-03: 1 via ORAL
  Filled 2013-08-03: qty 1

## 2013-08-03 MED ORDER — THIAMINE HCL 100 MG/ML IJ SOLN: 100.0000 mg | Freq: Once | INTRAMUSCULAR | Status: DC

## 2013-08-03 MED ORDER — FLUOXETINE HCL 20 MG PO CAPS
ORAL_CAPSULE | ORAL | Status: AC
  Administered 2013-08-03: 40 mg via ORAL
  Filled 2013-08-03: qty 2

## 2013-08-03 MED ORDER — NICOTINE 21 MG/24HR TD PT24
21.0000 mg | MEDICATED_PATCH | Freq: Every day | TRANSDERMAL | Status: DC
  Administered 2013-08-03: 21 mg via TRANSDERMAL
  Filled 2013-08-03 (×4): qty 1

## 2013-08-03 MED ORDER — CHLORDIAZEPOXIDE HCL 25 MG PO CAPS
25.0000 mg | ORAL_CAPSULE | Freq: Every day | ORAL | Status: AC
  Administered 2013-08-06: 25 mg via ORAL

## 2013-08-03 MED ORDER — ALUM & MAG HYDROXIDE-SIMETH 200-200-20 MG/5ML PO SUSP: 30.0000 mL | ORAL | Status: DC | PRN

## 2013-08-03 MED ORDER — NICOTINE 14 MG/24HR TD PT24: 14.0000 mg | MEDICATED_PATCH | Freq: Every day | TRANSDERMAL | Status: DC

## 2013-08-03 MED ORDER — HYDROXYZINE HCL 25 MG PO TABS
25.0000 mg | ORAL_TABLET | Freq: Four times a day (QID) | ORAL | Status: AC | PRN
  Administered 2013-08-03 – 2013-08-05 (×6): 25 mg via ORAL
  Filled 2013-08-03 (×7): qty 1

## 2013-08-03 MED ORDER — ONDANSETRON 4 MG PO TBDP
4.0000 mg | ORAL_TABLET | Freq: Four times a day (QID) | ORAL | Status: AC | PRN
  Administered 2013-08-03 – 2013-08-05 (×4): 4 mg via ORAL
  Filled 2013-08-03 (×5): qty 1

## 2013-08-03 MED ORDER — CHLORDIAZEPOXIDE HCL 25 MG PO CAPS
25.0000 mg | ORAL_CAPSULE | Freq: Four times a day (QID) | ORAL | Status: AC | PRN
  Administered 2013-08-03 – 2013-08-05 (×4): 25 mg via ORAL
  Filled 2013-08-03 (×4): qty 1

## 2013-08-03 MED ORDER — ACETAMINOPHEN 325 MG PO TABS
650.0000 mg | ORAL_TABLET | Freq: Four times a day (QID) | ORAL | Status: DC | PRN
  Administered 2013-08-03 – 2013-08-08 (×10): 650 mg via ORAL
  Filled 2013-08-03 (×12): qty 2

## 2013-08-03 MED ORDER — PANTOPRAZOLE SODIUM 40 MG PO TBEC
DELAYED_RELEASE_TABLET | ORAL | Status: AC
  Administered 2013-08-03: 40 mg via ORAL
  Filled 2013-08-03: qty 1

## 2013-08-03 MED ORDER — ONDANSETRON HCL 4 MG PO TABS: 4.0000 mg | ORAL_TABLET | Freq: Three times a day (TID) | ORAL | Status: DC | PRN

## 2013-08-03 MED ORDER — CHLORDIAZEPOXIDE HCL 25 MG PO CAPS
50.0000 mg | ORAL_CAPSULE | Freq: Once | ORAL | Status: AC
  Administered 2013-08-03: 50 mg via ORAL
  Filled 2013-08-03: qty 2

## 2013-08-03 MED ORDER — LORAZEPAM 1 MG PO TABS: 1.0000 mg | ORAL_TABLET | ORAL | Status: DC | PRN

## 2013-08-03 MED ORDER — ADULT MULTIVITAMIN W/MINERALS CH
1.0000 | ORAL_TABLET | Freq: Every day | ORAL | Status: DC
  Administered 2013-08-03 – 2013-08-09 (×5): 1 via ORAL
  Filled 2013-08-03 (×12): qty 1

## 2013-08-03 MED ORDER — CHLORDIAZEPOXIDE HCL 25 MG PO CAPS
25.0000 mg | ORAL_CAPSULE | ORAL | Status: AC
  Administered 2013-08-05 (×2): 25 mg via ORAL
  Filled 2013-08-03 (×2): qty 1

## 2013-08-03 MED ORDER — CHLORDIAZEPOXIDE HCL 25 MG PO CAPS
25.0000 mg | ORAL_CAPSULE | Freq: Three times a day (TID) | ORAL | Status: AC
  Administered 2013-08-04 (×3): 25 mg via ORAL
  Filled 2013-08-03 (×3): qty 1

## 2013-08-03 MED ORDER — MAGNESIUM HYDROXIDE 400 MG/5ML PO SUSP: 30.0000 mL | Freq: Every day | ORAL | Status: DC | PRN

## 2013-08-03 MED ORDER — CHLORDIAZEPOXIDE HCL 25 MG PO CAPS
25.0000 mg | ORAL_CAPSULE | Freq: Four times a day (QID) | ORAL | Status: AC
  Administered 2013-08-03 (×4): 25 mg via ORAL
  Filled 2013-08-03 (×4): qty 1

## 2013-08-03 NOTE — ED Notes (Addendum)
Seen here yesterday after drinking listerine. Now presents ambulatory to registration, states "I need help" states she wants to" walk out in front of a car and kill myself" admits to drinking listerine again today.

## 2013-08-03 NOTE — [facility] Group Notes (Signed)
[facility] LCSW Aftercare Discharge Planning Group Note   08/03/2013 10:31 AM  Participation Quality:  DID NOT ATTEND    Smart, Lebron Quam

## 2013-08-03 NOTE — [facility] Suicide Risk Assessment (Signed)
Suicide Risk Assessment  Admission Assessment     Nursing information obtained from:  Patient Demographic factors:  Divorced or widowed;Caucasian;Low socioeconomic status;Living alone;Unemployed Current Mental Status:  Suicidal ideation indicated by patient;Suicide plan;Self-harm thoughts;Intention to act on suicide plan;Belief that plan would result in death Loss Factors:  Financial problems / change in socioeconomic status Historical Factors:  Prior suicide attempts;Family history of suicide;Family history of mental illness or substance abuse;Domestic violence in family of origin Risk Reduction Factors:  NA  CLINICAL FACTORS:   Panic Attacks Depression:   Comorbid alcohol abuse/dependence Alcohol/Substance Abuse/Dependencies  COGNITIVE FEATURES THAT CONTRIBUTE TO RISK:  Closed-mindedness Polarized thinking Thought constriction (tunnel vision)    SUICIDE RISK:   Moderate:  Frequent suicidal ideation with limited intensity, and duration, some specificity in terms of plans, no associated intent, good self-control, limited dysphoria/symptomatology, some risk factors present, and identifiable protective factors, including available and accessible social support.  PLAN OF CARE: Supportive approach/coping skills/relapse prevention                               [facility] detox protocol                               CBT;mindfulness                               Optimize treatment with psychotropics  I certify that inpatient services furnished can reasonably be expected to improve the patient's condition.  Burdell Peed A 08/03/2013, 4:03 PM

## 2013-08-03 NOTE — Progress Notes (Signed)
D: Pt has been in bed all shift, sleeping off and on. Pt has had multiple complaints of nausea, headache. CIWA-10, for nausea, tremors and headache. Pt able to walk up to med room window. A: Pt started back on Prozac Protonix, and received prns of Tylenol, Zofran and Vistaril on this shift. (7-3) Fluids encouraged.. Pt allowed to sleep until noon due to early AM admission. R: Pt states, "I dont feel good, I need to stay in bed. VSS. Pt denies SI/HI. Mood: anxious/sleepy.

## 2013-08-03 NOTE — ED Provider Notes (Signed)
CSN: [id number]     Arrival date & time 08/03/13  3086 History   First MD Initiated Contact with Patient 08/03/13 0241   Chief complaint - suicidal   Patient is a 47 y.o. female presenting with mental health disorder. The history is provided by the patient.  Mental Health Problem Presenting symptoms: suicidal thoughts   Degree of incapacity (severity):  Moderate Onset quality:  Gradual Duration:  1 day Timing:  Constant Progression:  Worsening Chronicity:  New Context: alcohol use   Relieved by:  Nothing Worsened by:  Alcohol Associated symptoms: anxiety and headaches   Associated symptoms: no abdominal pain and no chest pain     Past Medical History  Diagnosis Date  . Peptic ulcer   . Alcohol abuse   . Anemia   . Benzodiazepine abuse   . Thyroid disease   . Alcoholism   . Narcotic abuse   . Back pain   . Thrombocytopenia 06/17/2011  . Hypothyroidism   . Seizures   . Medical history non-contributory     hypoglycemic   Past Surgical History  Procedure Laterality Date  . Cholecystectomy    . Abdominal surgery    . Esophagogastroduodenoscopy    . Gastric bypass    . Tubal ligation    . Esophagogastroduodenoscopy N/A 03/23/2013    Procedure: ESOPHAGOGASTRODUODENOSCOPY (EGD);  Surgeon: Meryl Dare, MD;  Location: [facility];  Service: Endoscopy;  Laterality: N/A;  . No past surgeries      gastric bypass   Family History  Problem Relation Age of Onset  . Alcohol abuse Father   . Alcoholism Father   . Cancer Other    History  Substance Use Topics  . Smoking status: Current Every Day Smoker -- 1.00 packs/day for 12 years    Types: Cigarettes    Last Attempt to Quit: 08/08/2011  . Smokeless tobacco: Never Used  . Alcohol Use: Yes     Comment: drink 4 liters a day. 1 case/day drinks Listerine   OB History   Grav Para Term Preterm Abortions TAB SAB Ect Mult Living   3 3  3      3      Review of Systems  Constitutional: Negative for fever.  Respiratory:  Negative for shortness of breath.   Cardiovascular: Negative for chest pain.  Gastrointestinal: Negative for vomiting and abdominal pain.  Neurological: Positive for headaches. Negative for weakness.  Psychiatric/Behavioral: Positive for suicidal ideas. The patient is nervous/anxious.   All other systems reviewed and are negative.    Allergies  Nsaids  Home Medications   Current Outpatient Rx  Name  Route  Sig  Dispense  Refill  . FLUoxetine (PROZAC) 40 MG capsule   Oral   Take 40 mg by mouth daily.         Marland Kitchen levothyroxine (SYNTHROID, LEVOTHROID) 125 MCG tablet   Oral   Take 1 tablet (125 mcg total) by mouth daily before breakfast. For thyroid hormone replacement   30 tablet   0    BP 105/73  Pulse 100  Temp(Src) 97.7 F (36.5 C) (Oral)  Resp 16  Ht 5\' 9"  (1.753 m)  Wt 190 lb (86.183 kg)  BMI 28.05 kg/m2  SpO2 100%  LMP 07/18/2013 Physical Exam CONSTITUTIONAL: disheveled HEAD: Normocephalic/atraumatic, no evidence of trauma noted EYES: EOMI/PERRL ENMT: Mucous membranes moist NECK: supple no meningeal signs SPINE:entire spine nontender, No bruising/crepitance/stepoffs noted to spine CV: S1/S2 noted, no murmurs/rubs/gallops noted LUNGS: Lungs are clear to auscultation bilaterally,  no apparent distress ABDOMEN: soft, nontender, no rebound or guarding GU:no cva tenderness NEURO: Pt is awake/alert, moves all extremitiesx4, GCS 15 EXTREMITIES: pulses normal, full ROM SKIN: warm, color normal, scattered abrasions to extremities and small abrasion to right upper back PSYCH: anxious  ED Course  Procedures (including critical care time) Labs Review Labs Reviewed  CBC WITH DIFFERENTIAL  ETHANOL  URINE RAPID DRUG SCREEN (HOSP PERFORMED)  SALICYLATE LEVEL  ACETAMINOPHEN LEVEL  COMPREHENSIVE METABOLIC PANEL     EKG Interpretation   None      3:05 AM Pt here due to SI.  She has been drinking heavily for past several days, reports she can't stop and now has  SI.  She reports she has been drinking listerine.  She was seen twice in the past 48 hours for ETOH abuse (no SI at that time) but now is suicidal.  Her plan is to walk in front of car.   Recent labs reviewed Will consult telepsych  MDM  No diagnosis found. Nursing notes including past medical history and social history reviewed and considered in documentation Previous records reviewed and considered      Joya Gaskins, MD 08/03/13 [phone]

## 2013-08-03 NOTE — Progress Notes (Signed)
Psychoeducational Group Note  Date:  08/03/2013 Time:  1100  Group Topic/Focus:  Self Care:   The focus of this group is to help patients understand the importance of self-care in order to improve or restore emotional, physical, spiritual, interpersonal, and financial health.  Participation Level: Did Not Attend  Participation Quality:  Not Applicable  Affect:  Not Applicable  Cognitive:  Not Applicable  Insight:  Not Applicable  Engagement in Group: Not Applicable  Additional Comments:  Jacqueline [name] did not attend group, patient stated she was not feeling well, remained in bed. Nurse notified.   [name] 08/03/2013, 6:32 PM

## 2013-08-03 NOTE — Progress Notes (Signed)
Pt is a 47 year old female admitted to the services of Dr. Dub Mikes for detox.  Pt has been drinking listerine.  She did not feel up to answering questions about amount and for how long at this time.  Pt is currently homeless.  She endorsed suicidal ideation to walk into traffic.  Her BAL was 259 when she came in at around 0300 this morning.  Pt had no home medications but states that she has been on Prozac and synthroid in the past.  She can not take NSAIDS due to having stomach ulcers and peptic ulcer disease.  Pt has a long history of anemia.  Her H & H was 10 and 32 in her current lab work.  Pt lists Malachi Carl as her emergency contact and she may be reached at [phone]

## 2013-08-03 NOTE — Progress Notes (Signed)
Patient ID: Jacqueline [name], female   DOB: [birth year], 47 y.o.   MRN: [id number]  D: Patient presents with flat affect and depressed mood. Patient denies SI, HI, and A/V hallucinations. Patient is not seen in the milieu this evening and did not attend the evening AA group due to sleeping in her room. However, patient was able to come to the medication window for scheduled medications.  A: Encouragement and support given to patient by Clinical research associate for patient to speak with writer if patient needs anything. Writer gave scheduled medications to patient.  R: Patient cooperative and Q15 minute checks are maintained for safety. Patient is safe at this time

## 2013-08-03 NOTE — Tx Team (Signed)
Interdisciplinary Treatment Plan Update (Adult)  Date: 08/03/2013   Time Reviewed: 12:23 PM  Progress in Treatment:  Attending groups: No.  Participating in groups:  No.  Taking medication as prescribed: Yes  Tolerating medication: Yes  Family/Significant othe contact made: Not yet. SPE required for this pt.   Patient understands diagnosis: Yes, AEB seeking treatment for ETOH detox, medication management, SI with plan, and mood stabilization.  Discussing patient identified problems/goals with staff: Yes  Medical problems stabilized or resolved: Yes  Denies suicidal/homicidal ideation: Passive SI, able to contract for safety.  Patient has not harmed self or Others: Yes  New problem(s) identified:  Discharge Plan or Barriers: Pt currently not attending d/c planning group. CSW assessing for appropriate referrals.  Additional comments: Jacqueline [name] is an 48 y.o. female, single, Caucasian who presents to [facility] ED requesting detox from alcohol and reporting suicidal ideation. Pt has a long history of alcohol dependence and mood disorder and was last inpatient at [facility] in August 2014. She reports she relapsed on alcohol a couple of days ago and has been drinking approximately a fifth of liquor daily. She also reports drinking mouth wash. She cannot identify a trigger for her relapse. She states she has a history of blackouts and seizures with her last seizure being a year and a half ago.She reports that she feels depressed and hopeless. She states she is currently suicidal with a plan to walk into traffic. She reports a history of two previous suicide attempts by overdose. She also reports she had a cousin who completed suicide. She states she cannot contract for safety outside the hospital. Pt is living in a halfway house and says she can return to this arrangement when she is stabilized. She reports she has been in treatment at many different facilities and that [facility] was her most recent  hospitalization. She reports a history of hypothyroidism and says she has not been taking her synthroid due to intoxication. She has three children, ages 75, 12 and 81, who live with her mother and she identifies them as her primary support. Reason for Continuation of Hospitalization: Librium taper-withdrawals Mood stabilization Medication management SI Estimated length of stay: 3-5 days  For review of initial/current patient goals, please see plan of care.  Attendees:  Patient:    Family:    Physician: Geoffery Lyons MD 08/03/2013 12:23 PM   Nursing: Alease Frame RN  08/03/2013 12:23 PM   Clinical Social Worker Aseel Truxillo Smart, LCSWA  08/03/2013 12:23 PM   Other: Quintella Reichert, RN  08/03/2013 12:23 PM   Other:  08/03/2013 12:23 PM   Other: Massie Kluver, Community Care Coordinator  08/03/2013 12:23 PM   Other:  08/03/2013 12:23 PM   Scribe for Treatment Team:  Herbert Seta Smart LCSWA 08/03/2013 12:23 PM

## 2013-08-03 NOTE — Progress Notes (Signed)
Nutrition Brief Note  Patient identified on the Malnutrition Screening Tool (MST) Report.  Wt Readings from Last 10 Encounters:  08/03/13 186 lb (84.369 kg)  08/03/13 190 lb (86.183 kg)  05/13/13 188 lb (85.276 kg)  04/14/13 175 lb (79.379 kg)  03/29/13 186 lb (84.369 kg)  03/29/13 190 lb (86.183 kg)  03/25/13 190 lb (86.183 kg)  03/22/13 193 lb 1.6 oz (87.59 kg)  03/22/13 193 lb 1.6 oz (87.59 kg)  12/22/12 180 lb (81.647 kg)   Body mass index is 29.12 kg/(m^2). Patient meets criteria for overweight based on current BMI.   Discussed intake PTA with patient and compared to intake presently.  Discussed changes in intake, if any, and encouraged adequate intake of meals and snacks. Per H&P, pt was drinking heavily for the past several days, admitted with suicidal ideation. Reported drinking listerine.   Pt c/o nausea that started today. Has prevented her from eating any of her meals today. Reports PTA she had a good appetite, was eating 3 meals/day. Sometimes these were cooked at home, other times she went out to eat. Denies any changes in weight. Says the medicines have been helping with nausea. Drinking some coffee during conversation.   Current diet order is regular and pt is also offered choice of unit snacks mid-morning and mid-afternoon.  Pt is eating as desired.   Labs and medications reviewed. Getting daily multivitamin and thiamine.   Nutrition Dx: Inadequate oral intake related to nausea as evidenced by pt report   Interventions:   Discussed the importance of nutrition and encouraged intake of food and beverages.     Encouraged increased meal intake as nausea resolves.    Supplements: Vanilla Ensure Complete at bedtime  No additional nutrition interventions warranted at this time. If nutrition issues arise, please consult RD.   Levon Hedger MS, RD, LDN [phone] Pager [phone] After Hours Pager

## 2013-08-03 NOTE — Progress Notes (Addendum)
Pt did not attend AA group  
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
## 2013-08-03 NOTE — BHH Counselor (Signed)
Adult Psychosocial Assessment Update Interdisciplinary Team  [facility] admissions[facility]discharges:  Admissions Discharges  Date: 08/03/13 Date: unknown at this time  Date: 05/13/13 Date: 05/14/13  Date: 04/14/13 Date: 04/20/13  Date: 03/29/13 Date: 04/02/13  Date: 12/22/12 Date: 12/22/12  Date: 02/21/12     Date: 02/23/13 Date: 12/18/11     Date: 12/21/11  Changes since the last Psychosocial Assessment (including adherence to outpatient mental health and/or substance abuse treatment, situational issues contributing to decompensation and/or relapse). Lurlie reports she relapsed on alcohol a couple of days ago and has been drinking approximately a fifth of liquor daily. She also reports drinking mouth wash. She cannot identify a trigger for her relapse. She states she has a history of blackouts and seizures with her last seizure being a year and a half ago. She denies abusing any other substances at this time and her UDS is negative. She reports that she feels depressed and hopeless. She states she is currently suicidal with a plan to walk into traffic. She reports a history of two previous suicide attempts by overdose. She also reports she had a cousin who completed suicide. She states she cannot contract for safety outside the hospital. Pt is living in a halfway house and says she can return to this arrangement when she is stabilized. She reports she has been in treatment at many different facilities and that [facility] was her most recent hospitalization. She reports a history of hypothyroidism and says she has not been taking her synthroid due to intoxication. She has three children, ages 12, 72 and 6, who live with her mother and she identifies them as her primary support.              Discharge Plan 1. Will you be returning to the same living situation after discharge?   Yes: UNKNOWN No:      If no, what is your plan?    She is not attending d/c planning at this time and is unsure  if she will return to halfway house. Pt likely unable to return to [facility] due to recent treatment at these facilities. CSW assessing for appropriate referrals. Pt was referred to [facility] for med management during last admission.        2. Would you like a referral for services when you are discharged? Yes:     If yes, for what services?  No:       CSW assessing for appropriate referrals at this time.        Summary and Recommendations (to be completed by the evaluator) [name] is an 47 y.o. female, single, Caucasian who presents to [facility] ED requesting detox from alcohol and reporting suicidal ideation. Pt has a long history of alcohol dependence and mood disorder and was last inpatient at [facility] in August 2014.   Recommendations for pt include: crisis stabilization, therapeutic milieu, encourage group attendance and participation, librium taper for withdrawals, medication management for mood stabilization, and development of comprehensive mental wellness/sobriety plan.                     Signature:  Trula Slade, 08/03/2013 12:32 PM

## 2013-08-03 NOTE — [facility] Group Notes (Signed)
[facility] LCSW Group Therapy  08/03/2013 3:09 PM  Type of Therapy:  Group Therapy  Participation Level:  Did Not Attend  Smart, Jaxx Huish 08/03/2013, 3:09 PM
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
## 2013-08-03 NOTE — Tx Team (Signed)
Initial Interdisciplinary Treatment Plan  PATIENT STRENGTHS: (choose at least two) Average or above average intelligence Motivation for treatment/growth  PATIENT STRESSORS: Financial difficulties Substance abuse   PROBLEM LIST: Problem List/Patient Goals Date to be addressed Date deferred Reason deferred Estimated date of resolution  Substance abuse 08/03/13     Depression 08/03/13     Suicidal Ideation 08/03/13                                          DISCHARGE CRITERIA:  Adequate post-discharge living arrangements Improved stabilization in mood, thinking, and/or behavior Motivation to continue treatment in a less acute level of care Need for constant or close observation no longer present Withdrawal symptoms are absent or subacute and managed without 24-hour nursing intervention  PRELIMINARY DISCHARGE PLAN: Attend 12-step recovery group Placement in alternative living arrangements  PATIENT/FAMIILY INVOLVEMENT: This treatment plan has been presented to and reviewed with the patient, [name].  The patient and family have been given the opportunity to ask questions and make suggestions.  Juliann Pares 08/03/2013, 6:43 AM

## 2013-08-04 MED ORDER — SUMATRIPTAN SUCCINATE 6 MG/0.5ML ~~LOC~~ SOLN
6.0000 mg | Freq: Once | SUBCUTANEOUS | Status: AC
  Administered 2013-08-04: 6 mg via SUBCUTANEOUS
  Filled 2013-08-04 (×2): qty 0.5

## 2013-08-04 MED ORDER — NICOTINE 14 MG/24HR TD PT24
14.0000 mg | MEDICATED_PATCH | Freq: Every day | TRANSDERMAL | Status: DC
  Filled 2013-08-04: qty 1

## 2013-08-04 MED ORDER — NICOTINE 14 MG/24HR TD PT24
14.0000 mg | MEDICATED_PATCH | Freq: Every day | TRANSDERMAL | Status: DC
  Administered 2013-08-04 – 2013-08-10 (×7): 14 mg via TRANSDERMAL
  Filled 2013-08-04 (×9): qty 1

## 2013-08-04 MED ORDER — IBUPROFEN 600 MG PO TABS
600.0000 mg | ORAL_TABLET | Freq: Four times a day (QID) | ORAL | Status: DC | PRN
  Administered 2013-08-04 – 2013-08-11 (×9): 600 mg via ORAL
  Filled 2013-08-04 (×9): qty 1

## 2013-08-04 NOTE — Progress Notes (Signed)
The focus of this group is to educate the patient on the purpose and policies of crisis stabilization and provide a format to answer questions about their admission.  The group details unit policies and expectations of patients while admitted. Patient did not attend because of an headache.

## 2013-08-04 NOTE — Progress Notes (Signed)
Recreation Therapy Notes  Date: 10.28.2014 Time: 2:30pm Location: [address] Dayroom  Group Topic: Software engineer Activities (AAA)  Behavioral Response: Drowsy   Affect: Flat  Clinical Observations/Feedback: Dog Team: [employer]. Patient interacted appropriately with peer, dog team, LRT and MHT.   Marykay Lex Arlyn Bumpus, LRT/CTRS  Jearl Klinefelter 08/04/2013 4:57 PM

## 2013-08-04 NOTE — Progress Notes (Signed)
[facility] MD Progress Note  08/04/2013 4:55 PM Jacqueline [name]  MRN:  [id number] Subjective:  Jacqueline [name] continues to have a hard time. States that she does not feel well. She is withdrawing. States that she feels she needs to go to another rehab. Says that if she does not she is not going to make it. She had gotten a job and states she cant handle the pressure. She claims she had a panic attack and had to leave. Does not know how she is going to be able to function in the outside world. Claims no resources, no support Diagnosis:   DSM5: Schizophrenia Disorders:  none Obsessive-Compulsive Disorders:  None Trauma-Stressor Disorders:  Posttraumatic Stress Disorder (309.81) Substance/Addictive Disorders:  Alcohol Related Disorder - Severe (303.90), Cocaine Abuse Depressive Disorders:  Major Depressive Disorder - Severe (296.23)  Axis I: Substance Induced Mood Disorder  ADL's:  Intact  Sleep: Fair  Appetite:  Fair  Suicidal Ideation:  Plan:  denies Intent:  denies Means:  denies Homicidal Ideation:  Plan:  denies Intent:  denies Means:  denies AEB (as evidenced by):  Psychiatric Specialty Exam: Review of Systems  Constitutional: Positive for malaise/fatigue.  Eyes: Positive for photophobia.  Respiratory: Negative.   Cardiovascular: Negative.   Gastrointestinal: Positive for nausea.  Genitourinary: Negative.   Musculoskeletal: Positive for myalgias.  Skin: Negative.   Neurological: Positive for dizziness, weakness and headaches.  Endo/Heme/Allergies: Negative.   Psychiatric/Behavioral: Positive for depression and substance abuse. The patient is nervous/anxious.     Blood pressure 113/79, pulse 93, temperature 99 F (37.2 C), temperature source Oral, resp. rate 16, height 5\' 7"  (1.702 m), weight 84.369 kg (186 lb), last menstrual period 07/18/2013.Body mass index is 29.12 kg/(m^2).  General Appearance: Disheveled  Eye Contact::  Minimal  Speech:  Clear and Coherent and Slow   Volume:  Decreased  Mood:  Anxious, Depressed and uncomfortable, in pain  Affect:  anxious, sad, worried  Thought Process:  Coherent and Goal Directed  Orientation:  Full (Time, Place, and Person)  Thought Content:  worries, concerns, somatically focused  Suicidal Thoughts:  Intermittent when she gets hopeless  Homicidal Thoughts:  No  Memory:  Immediate;   Fair Recent;   Fair Remote;   Fair  Judgement:  Fair  Insight:  Present  Psychomotor Activity:  Restlessness  Concentration:  Fair  Recall:  Fair  Akathisia:  No  Handed:    AIMS (if indicated):     Assets:  Desire for Improvement  Sleep:  Number of Hours: 5   Current Medications: Current Facility-Administered Medications  Medication Dose Route Frequency Provider Last Rate Last Dose  . acetaminophen (TYLENOL) tablet 650 mg  650 mg Oral Q6H PRN Court Joy, PA-C   650 mg at 08/04/13 0331  . alum & mag hydroxide-simeth (MAALOX/MYLANTA) 200-200-20 MG/5ML suspension 30 mL  30 mL Oral Q4H PRN Court Joy, PA-C      . chlordiazePOXIDE (LIBRIUM) capsule 25 mg  25 mg Oral Q6H PRN Court Joy, PA-C   25 mg at 08/04/13 0331  . [START ON 08/05/2013] chlordiazePOXIDE (LIBRIUM) capsule 25 mg  25 mg Oral BH-qamhs Court Joy, PA-C       Followed by  . [START ON 08/06/2013] chlordiazePOXIDE (LIBRIUM) capsule 25 mg  25 mg Oral Daily Court Joy, PA-C      . feeding supplement (ENSURE COMPLETE) (ENSURE COMPLETE) liquid 237 mL  237 mL Oral QHS Lavena Bullion, RD      .  FLUoxetine (PROZAC) capsule 40 mg  40 mg Oral Daily Rachael Fee, MD   40 mg at 08/04/13 0818  . hydrOXYzine (ATARAX/VISTARIL) tablet 25 mg  25 mg Oral Q6H PRN Court Joy, PA-C   25 mg at 08/04/13 1441  . ibuprofen (ADVIL,MOTRIN) tablet 600 mg  600 mg Oral Q6H PRN Rachael Fee, MD   600 mg at 08/04/13 0865  . levothyroxine (SYNTHROID, LEVOTHROID) tablet 125 mcg  125 mcg Oral QAC breakfast Rachael Fee, MD   125 mcg at 08/04/13 [phone]  . loperamide  (IMODIUM) capsule 2-4 mg  2-4 mg Oral PRN Court Joy, PA-C      . magnesium hydroxide (MILK OF MAGNESIA) suspension 30 mL  30 mL Oral Daily PRN Court Joy, PA-C      . multivitamin with minerals tablet 1 tablet  1 tablet Oral Daily Court Joy, PA-C   1 tablet at 08/04/13 0818  . nicotine (NICODERM CQ - dosed in mg/24 hours) patch 14 mg  14 mg Transdermal Q0600 Rachael Fee, MD   14 mg at 08/04/13 9629  . ondansetron (ZOFRAN-ODT) disintegrating tablet 4 mg  4 mg Oral Q6H PRN Court Joy, PA-C   4 mg at 08/04/13 0850  . pantoprazole (PROTONIX) EC tablet 40 mg  40 mg Oral BID Rachael Fee, MD   40 mg at 08/04/13 1646  . phenazopyridine (PYRIDIUM) tablet 200 mg  200 mg Oral TID WC Rachael Fee, MD   200 mg at 08/04/13 1646  . thiamine (B-1) injection 100 mg  100 mg Intramuscular Once Court Joy, PA-C      . thiamine (VITAMIN B-1) tablet 100 mg  100 mg Oral Daily Court Joy, PA-C   100 mg at 08/04/13 5284    Lab Results:  Results for orders placed during the hospital encounter of 08/03/13 (from the past 48 hour(s))  URINALYSIS, ROUTINE W REFLEX MICROSCOPIC     Status: None   Collection Time    08/03/13  3:05 AM      Result Value Range   Color, Urine YELLOW  YELLOW   APPearance CLEAR  CLEAR   Specific Gravity, Urine 1.025  1.005 - 1.030   pH 5.5  5.0 - 8.0   Glucose, UA NEGATIVE  NEGATIVE mg/dL   Hgb urine dipstick NEGATIVE  NEGATIVE   Bilirubin Urine NEGATIVE  NEGATIVE   Ketones, ur NEGATIVE  NEGATIVE mg/dL   Protein, ur NEGATIVE  NEGATIVE mg/dL   Urobilinogen, UA 0.2  0.0 - 1.0 mg/dL   Nitrite NEGATIVE  NEGATIVE   Leukocytes, UA NEGATIVE  NEGATIVE   Comment: MICROSCOPIC NOT DONE ON URINES WITH NEGATIVE PROTEIN, BLOOD, LEUKOCYTES, NITRITE, OR GLUCOSE <1000 mg/dL.  URINE RAPID DRUG SCREEN (HOSP PERFORMED)     Status: None   Collection Time    08/03/13  3:10 AM      Result Value Range   Opiates NONE DETECTED  NONE DETECTED   Cocaine NONE DETECTED  NONE  DETECTED   Benzodiazepines NONE DETECTED  NONE DETECTED   Amphetamines NONE DETECTED  NONE DETECTED   Tetrahydrocannabinol NONE DETECTED  NONE DETECTED   Barbiturates NONE DETECTED  NONE DETECTED   Comment:            DRUG SCREEN FOR MEDICAL PURPOSES     ONLY.  IF CONFIRMATION IS NEEDED     FOR ANY PURPOSE, NOTIFY LAB     WITHIN 5 DAYS.  LOWEST DETECTABLE LIMITS     FOR URINE DRUG SCREEN     Drug Class       Cutoff (ng/mL)     Amphetamine      1000     Barbiturate      200     Benzodiazepine   200     Tricyclics       300     Opiates          300     Cocaine          300     THC              50  CBC WITH DIFFERENTIAL     Status: Abnormal   Collection Time    08/03/13  3:21 AM      Result Value Range   WBC 6.6  4.0 - 10.5 K/uL   RBC 3.74 (*) 3.87 - 5.11 MIL/uL   Hemoglobin 10.2 (*) 12.0 - 15.0 g/dL   HCT 40.9 (*) 81.1 - 91.4 %   MCV 85.6  78.0 - 100.0 fL   MCH 27.3  26.0 - 34.0 pg   MCHC 31.9  30.0 - 36.0 g/dL   RDW 78.2 (*) 95.6 - 21.3 %   Platelets 399  150 - 400 K/uL   Neutrophils Relative % 76  43 - 77 %   Neutro Abs 5.0  1.7 - 7.7 K/uL   Lymphocytes Relative 16  12 - 46 %   Lymphs Abs 1.1  0.7 - 4.0 K/uL   Monocytes Relative 6  3 - 12 %   Monocytes Absolute 0.4  0.1 - 1.0 K/uL   Eosinophils Relative 1  0 - 5 %   Eosinophils Absolute 0.1  0.0 - 0.7 K/uL   Basophils Relative 1  0 - 1 %   Basophils Absolute 0.0  0.0 - 0.1 K/uL  ETHANOL     Status: Abnormal   Collection Time    08/03/13  3:21 AM      Result Value Range   Alcohol, Ethyl (B) 259 (*) 0 - 11 mg/dL   Comment:            LOWEST DETECTABLE LIMIT FOR     SERUM ALCOHOL IS 11 mg/dL     FOR MEDICAL PURPOSES ONLY  SALICYLATE LEVEL     Status: Abnormal   Collection Time    08/03/13  3:21 AM      Result Value Range   Salicylate Lvl <2.0 (*) 2.8 - 20.0 mg/dL  ACETAMINOPHEN LEVEL     Status: None   Collection Time    08/03/13  3:21 AM      Result Value Range   Acetaminophen (Tylenol), Serum  <15.0  10 - 30 ug/mL   Comment:            THERAPEUTIC CONCENTRATIONS VARY     SIGNIFICANTLY. A RANGE OF 10-30     ug/mL MAY BE AN EFFECTIVE     CONCENTRATION FOR MANY PATIENTS.     HOWEVER, SOME ARE BEST TREATED     AT CONCENTRATIONS OUTSIDE THIS     RANGE.     ACETAMINOPHEN CONCENTRATIONS     >150 ug/mL AT 4 HOURS AFTER     INGESTION AND >50 ug/mL AT 12     HOURS AFTER INGESTION ARE     OFTEN ASSOCIATED WITH TOXIC     REACTIONS.  COMPREHENSIVE METABOLIC PANEL     Status: Abnormal  Collection Time    08/03/13  3:21 AM      Result Value Range   Sodium 136  135 - 145 mEq/L   Potassium 3.5  3.5 - 5.1 mEq/L   Chloride 100  96 - 112 mEq/L   CO2 19  19 - 32 mEq/L   Glucose, Bld 102 (*) 70 - 99 mg/dL   BUN 8  6 - 23 mg/dL   Creatinine, Ser 1.61  0.50 - 1.10 mg/dL   Calcium 8.2 (*) 8.4 - 10.5 mg/dL   Total Protein 7.2  6.0 - 8.3 g/dL   Albumin 3.8  3.5 - 5.2 g/dL   AST 32  0 - 37 U/L   ALT 34  0 - 35 U/L   Alkaline Phosphatase 68  39 - 117 U/L   Total Bilirubin 0.2 (*) 0.3 - 1.2 mg/dL   GFR calc non Af Amer >90  >90 mL/min   GFR calc Af Amer >90  >90 mL/min   Comment: (NOTE)     The eGFR has been calculated using the CKD EPI equation.     This calculation has not been validated in all clinical situations.     eGFR's persistently <90 mL/min signify possible Chronic Kidney     Disease.    Physical Findings: AIMS: Facial and Oral Movements Muscles of Facial Expression: None, normal Lips and Perioral Area: None, normal Jaw: None, normal Tongue: None, normal,Extremity Movements Upper (arms, wrists, hands, fingers): None, normal Lower (legs, knees, ankles, toes): None, normal, Trunk Movements Neck, shoulders, hips: None, normal, Overall Severity Severity of abnormal movements (highest score from questions above): None, normal Incapacitation due to abnormal movements: None, normal Patient's awareness of abnormal movements (rate only patient's report): No Awareness, Dental  Status Current problems with teeth and/or dentures?: No Does patient usually wear dentures?: No  CIWA:  CIWA-Ar Total: 6 COWS:     Treatment Plan Summary: Daily contact with patient to assess and evaluate symptoms and progress in treatment Medication management  Plan: Supportive approach/coping skills/relapse prevention            Continue detox            Reassess and address the co morbidities            C/O migraine headache             Will use Imitrex Medical Decision Making Problem Points:  Established problem, worsening (2), New problem, with no additional work-up planned (3) and Review of psycho-social stressors (1) Data Points:  Review of medication regiment & side effects (2) Review of new medications or change in dosage (2)  I certify that inpatient services furnished can reasonably be expected to improve the patient's condition.   Kinnley Paulson A 08/04/2013, 4:55 PM

## 2013-08-04 NOTE — [facility] Suicide Risk Assessment (Signed)
[facility] INPATIENT:  Family/Significant Other Suicide Prevention Education  Suicide Prevention Education:  Patient Refusal for Family/Significant Other Suicide Prevention Education: The patient Jacqueline [name] has refused to provide written consent for family/significant other to be provided Family/Significant Other Suicide Prevention Education during admission and/or prior to discharge.  Physician notified.  SPE completed with pt. SPI pamphlet provided to pt and she was encouraged to share information with support network, ask questions, and talk about concerns.   Smart, Ariella Voit 08/04/2013, 10:51 AM

## 2013-08-04 NOTE — [facility] Group Notes (Signed)
[facility] LCSW Group Therapy  08/04/2013 3:16 PM  Type of Therapy:  Group Therapy  Participation Level:  Active  Participation Quality:  Attentive  Affect:  Depressed  Cognitive:  Alert  Insight:  Limited  Engagement in Therapy:  Limited  Modes of Intervention:  Education, Socialization and Support  Summary of Progress/Problems: [facility]HA Speaker came to talk about his personal journey with substance abuse and addiction. The pt processed ways by which to relate to the speaker. MHA speaker provided handouts and educational information pertaining to groups and services offered by the [facility]. Adelheid was attentive and engaged throughout today's therapy group. She actively listened as the speaker discussed his personal experiences with MI and SA. Ishia shows progress in the group setting AEB her ability to remain attentive and engaged throughout group.    Smart, Javontay Vandam 08/04/2013, 3:16 PM

## 2013-08-04 NOTE — Progress Notes (Signed)
Adult Psychoeducational Group Note  Date:  08/04/2013 Time:  6:51 PM  Group Topic/Focus:  Recovery Goals:   The focus of this group is to identify appropriate goals for recovery and establish a plan to achieve them.  Participation Level:  Minimal  Participation Quality:  Appropriate and Attentive  Affect:  Appropriate, Depressed and Flat  Cognitive:  Appropriate  Insight: Improving  Engagement in Group:  Developing/Improving  Modes of Intervention:  Discussion, Education, Exploration, Limit-setting, Socialization and Support  Additional Comments:  Kaisyn attended group and shared during group. Patient explained what recovery means to patient. Patient was asked to complete workbook on My personal recovery goals, and wrote down two things that are standing between recovery and patient. Patient stated one goal to Clinical research associate as Clinical research associate wrote it down the white board. Patient also, wrote down and stated a goal in how to stay in a stage of recovery.  [name] 08/04/2013, 6:51 PM

## 2013-08-04 NOTE — Progress Notes (Signed)
D: Patient denies HI and A/V hallucinations and on and off thoughts of SI; patient reports sleep is poor; reports appetite is poor ; reports energy level is low ; reports ability to pay attention is poor; rates depression as 10/10; rates hopelessness 9/10; rates anxiety as 9/10; patient having complaints of a headache  A: Monitored q 15 minutes; patient encouraged to attend groups; patient educated about medications; patient given medications per physician orders; patient encouraged to express feelings and/or concerns  R: Patient is very needy and attention seeking; patient reports relief of headache after injection; patient's interaction with staff and peers is assertive but appropriate; patient was able to set goal to talk with staff 1:1 when having feelings of SI; patient is taking medications as prescribed and tolerating medications; patient is attending some groups

## 2013-08-05 DIAGNOSIS — F3130 Bipolar disorder, current episode depressed, mild or moderate severity, unspecified: Secondary | ICD-10-CM

## 2013-08-05 MED ORDER — BUSPIRONE HCL 10 MG PO TABS
10.0000 mg | ORAL_TABLET | Freq: Three times a day (TID) | ORAL | Status: DC
  Administered 2013-08-05 – 2013-08-07 (×7): 10 mg via ORAL
  Filled 2013-08-05 (×4): qty 1
  Filled 2013-08-05: qty 2
  Filled 2013-08-05 (×8): qty 1

## 2013-08-05 MED ORDER — AMOXICILLIN-POT CLAVULANATE 875-125 MG PO TABS
1.0000 | ORAL_TABLET | Freq: Two times a day (BID) | ORAL | Status: DC
  Administered 2013-08-05 – 2013-08-10 (×11): 1 via ORAL
  Filled 2013-08-05 (×2): qty 1
  Filled 2013-08-05 (×2): qty 2
  Filled 2013-08-05 (×10): qty 1

## 2013-08-05 MED ORDER — BISACODYL 10 MG RE SUPP
10.0000 mg | Freq: Once | RECTAL | Status: AC
  Administered 2013-08-05: 10 mg via RECTAL
  Filled 2013-08-05: qty 1

## 2013-08-05 MED ORDER — GABAPENTIN 400 MG PO CAPS
400.0000 mg | ORAL_CAPSULE | ORAL | Status: DC
  Filled 2013-08-05: qty 1

## 2013-08-05 MED ORDER — BUPROPION HCL ER (XL) 150 MG PO TB24
150.0000 mg | ORAL_TABLET | Freq: Every day | ORAL | Status: DC
  Administered 2013-08-05 – 2013-08-07 (×3): 150 mg via ORAL
  Filled 2013-08-05 (×6): qty 1

## 2013-08-05 MED ORDER — LITHIUM CARBONATE 300 MG PO CAPS
300.0000 mg | ORAL_CAPSULE | Freq: Two times a day (BID) | ORAL | Status: DC
  Administered 2013-08-05 – 2013-08-10 (×11): 300 mg via ORAL
  Filled 2013-08-05 (×6): qty 1
  Filled 2013-08-05: qty 28
  Filled 2013-08-05 (×5): qty 1
  Filled 2013-08-05: qty 28
  Filled 2013-08-05: qty 1

## 2013-08-05 MED ORDER — GABAPENTIN 400 MG PO CAPS
400.0000 mg | ORAL_CAPSULE | Freq: Four times a day (QID) | ORAL | Status: DC
  Administered 2013-08-05 – 2013-08-06 (×4): 400 mg via ORAL
  Filled 2013-08-05 (×11): qty 1

## 2013-08-05 NOTE — Progress Notes (Signed)
Patient ID: Jacqueline [name], female   DOB: Apr 02, 1966, 47 y.o.   MRN: [id number] She has been up off and of today and to paarts of groups. Poor interaction with peers. Has requested and received prn medications for pain and w/d symptoms. Has new order for antibotic for c.o rt ear pain.and for constipation.

## 2013-08-05 NOTE — Progress Notes (Signed)
[facility] MD Progress Note  08/05/2013 1:06 PM Jacqueline [name]  MRN:  [id number] Subjective:  Depressed and anxious, racing thoughts, "really depressed", patient was highly anxious--deep breathing practiced.  Beginning of right ear infection, antibiotic started.  Patient stated the Prozac was not working for her, wanted her Lithium and Wellbutrin back--Prozac discontinued and Lithium and Wellbutrin started.  Dulcolax for constipation upon patient request.  Gabapentin 400 mg QID for neuropathic pain per her request and Buspar TID 10 mg for anxiety. Diagnosis:   DSM5:  Substance/Addictive Disorders:  Alcohol Related Disorder - Severe (303.90), Alcohol Intoxication with Use Disorder - Severe (F10.229) and Alcohol Withdrawal (291.81)  Axis I: Alcohol Abuse, Anxiety Disorder NOS, Bipolar, Depressed, Substance Abuse and Substance Induced Mood Disorder Axis II: Deferred Axis III:  Past Medical History  Diagnosis Date  . Peptic ulcer   . Alcohol abuse   . Anemia   . Benzodiazepine abuse   . Thyroid disease   . Alcoholism   . Narcotic abuse   . Back pain   . Thrombocytopenia 06/17/2011  . Hypothyroidism   . Seizures   . Medical history non-contributory     hypoglycemic   Axis IV: economic problems, housing problems, occupational problems, other psychosocial or environmental problems, problems related to social environment and problems with primary support group Axis V: 41-50 serious symptoms  ADL's:  Intact  Sleep: Fair  Appetite:  Fair  Suicidal Ideation:  Plan:  yes, overdose on Listerine Intent:  yes Means:  none Homicidal Ideation:  Denies  Psychiatric Specialty Exam: Review of Systems  Constitutional: Negative.   HENT: Negative.   Eyes: Negative.   Respiratory: Negative.   Cardiovascular: Negative.   Gastrointestinal: Negative.   Genitourinary: Negative.   Musculoskeletal: Negative.   Skin: Negative.   Neurological: Negative.   Endo/Heme/Allergies: Negative.    Psychiatric/Behavioral: Positive for depression and substance abuse. The patient is nervous/anxious.     Blood pressure 107/73, pulse 88, temperature 98.5 F (36.9 C), temperature source Oral, resp. rate 18, height 5\' 7"  (1.702 m), weight 84.369 kg (186 lb), last menstrual period 07/18/2013.Body mass index is 29.12 kg/(m^2).  General Appearance: Casual  Eye Contact::  Fair  Speech:  Normal Rate  Volume:  Normal  Mood:  Anxious and Depressed  Affect:  Congruent  Thought Process:  Coherent  Orientation:  Full (Time, Place, and Person)  Thought Content:  WDL  Suicidal Thoughts:  Yes.  with intent/plan  Homicidal Thoughts:  No  Memory:  Immediate;   Fair Recent;   Fair Remote;   Fair  Judgement:  Poor  Insight:  Lacking  Psychomotor Activity:  Increased  Concentration:  Fair  Recall:  Fair  Akathisia:  No  Handed:  Right  AIMS (if indicated):     Assets:  Resilience  Sleep:  Number of Hours: 6.75   Current Medications: Current Facility-Administered Medications  Medication Dose Route Frequency Provider Last Rate Last Dose  . acetaminophen (TYLENOL) tablet 650 mg  650 mg Oral Q6H PRN Court Joy, PA-C   650 mg at 08/05/13 4098  . alum & mag hydroxide-simeth (MAALOX/MYLANTA) 200-200-20 MG/5ML suspension 30 mL  30 mL Oral Q4H PRN Court Joy, PA-C      . chlordiazePOXIDE (LIBRIUM) capsule 25 mg  25 mg Oral Q6H PRN Court Joy, PA-C   25 mg at 08/05/13 1204  . chlordiazePOXIDE (LIBRIUM) capsule 25 mg  25 mg Oral BH-qamhs Court Joy, PA-C   25 mg at 08/05/13 [id number]  Followed by  . [START ON 08/06/2013] chlordiazePOXIDE (LIBRIUM) capsule 25 mg  25 mg Oral Daily Court Joy, PA-C      . feeding supplement (ENSURE COMPLETE) (ENSURE COMPLETE) liquid 237 mL  237 mL Oral QHS Lavena Bullion, RD   237 mL at 08/04/13 1958  . FLUoxetine (PROZAC) capsule 40 mg  40 mg Oral Daily Rachael Fee, MD   40 mg at 08/05/13 0759  . hydrOXYzine (ATARAX/VISTARIL) tablet 25 mg  25 mg  Oral Q6H PRN Court Joy, PA-C   25 mg at 08/05/13 1610  . ibuprofen (ADVIL,MOTRIN) tablet 600 mg  600 mg Oral Q6H PRN Rachael Fee, MD   600 mg at 08/05/13 0801  . levothyroxine (SYNTHROID, LEVOTHROID) tablet 125 mcg  125 mcg Oral QAC breakfast Rachael Fee, MD   125 mcg at 08/05/13 9604  . loperamide (IMODIUM) capsule 2-4 mg  2-4 mg Oral PRN Court Joy, PA-C      . magnesium hydroxide (MILK OF MAGNESIA) suspension 30 mL  30 mL Oral Daily PRN Court Joy, PA-C      . multivitamin with minerals tablet 1 tablet  1 tablet Oral Daily Court Joy, PA-C   1 tablet at 08/05/13 0759  . nicotine (NICODERM CQ - dosed in mg/24 hours) patch 14 mg  14 mg Transdermal Q0600 Rachael Fee, MD   14 mg at 08/05/13 5409  . ondansetron (ZOFRAN-ODT) disintegrating tablet 4 mg  4 mg Oral Q6H PRN Court Joy, PA-C   4 mg at 08/05/13 1143  . pantoprazole (PROTONIX) EC tablet 40 mg  40 mg Oral BID Rachael Fee, MD   40 mg at 08/05/13 0759  . phenazopyridine (PYRIDIUM) tablet 200 mg  200 mg Oral TID WC Rachael Fee, MD   200 mg at 08/05/13 1203  . thiamine (B-1) injection 100 mg  100 mg Intramuscular Once Court Joy, PA-C      . thiamine (VITAMIN B-1) tablet 100 mg  100 mg Oral Daily Court Joy, PA-C   100 mg at 08/04/13 0818    Lab Results: No results found for this or any previous visit (from the past 48 hour(s)).  Physical Findings: AIMS: Facial and Oral Movements Muscles of Facial Expression: None, normal Lips and Perioral Area: None, normal Jaw: None, normal Tongue: None, normal,Extremity Movements Upper (arms, wrists, hands, fingers): None, normal Lower (legs, knees, ankles, toes): None, normal, Trunk Movements Neck, shoulders, hips: None, normal, Overall Severity Severity of abnormal movements (highest score from questions above): None, normal Incapacitation due to abnormal movements: None, normal Patient's awareness of abnormal movements (rate only patient's report): No  Awareness, Dental Status Current problems with teeth and/or dentures?: No Does patient usually wear dentures?: No  CIWA:  CIWA-Ar Total: 2 COWS:     Treatment Plan Summary: Daily contact with patient to assess and evaluate symptoms and progress in treatment Medication management  Plan:  Review of chart, vital signs, medications, and notes. 1-Individual and group therapy 2-Medication management for depression and anxiety:  Medications reviewed with the patient and multiple medication changes made based her requests, see note above 3-Coping skills for depression, anxiety, and alcohol abuse 4-Continue crisis stabilization and management 5-Address health issues--monitoring vital signs, stable 6-Treatment plan in progress to prevent relapse of depression, alcohol abuse, and anxiety  Medical Decision Making Problem Points:  Established problem, stable/improving (1) and Review of psycho-social stressors (1) Data Points:  Review of medication regiment &  side effects (2)  I certify that inpatient services furnished can reasonably be expected to improve the patient's condition.   Nanine Means, PMH-NP 08/05/2013, 1:06 PM Agree with assessment and plan Madie Reno A. Dub Mikes, M.D.

## 2013-08-05 NOTE — Progress Notes (Signed)
Adult Psychoeducational Group Note  Date:  08/05/2013 Time:  6:43 PM  Group Topic/Focus:  Personal Choices and Values:   The focus of this group is to help patients assess and explore the importance of values in their lives, how their values affect their decisions, how they express their values and what opposes their expression.  Participation Level:  Active  Participation Quality:  Appropriate, Attentive, Sharing and Supportive  Affect:  Appropriate  Cognitive:  Appropriate  Insight: Appropriate  Engagement in Group:  Engaged  Modes of Intervention:  Discussion, Education, Socialization and Support  Additional Comments:  Jacqueline [name] participated in group and shared. Patient completed worksheets in workbook with peers on identifying values and living a value oriented life. Voicing what values were important to patient and stated ways to improve choices that aren't healthy.   Jacqueline [name] 08/05/2013, 6:43 PM

## 2013-08-05 NOTE — Progress Notes (Signed)
Patient ID: [name], female   DOB: 19-Nov-1965, 47 y.o.   MRN: [id number]  D: Pt. presents with anxious mood and affect. Patient denies SI/HI and A/V hallucinations. Patient c/o headache and earache and requested PRN medications. Patient was sleeping on and off throughout the evening but was able to come to the nurses station for medications.   A: Writer gave support and encouraged patient to come to the window to get her scheduled and PRN medications. PRN Tylenol was given to patient for headache and earache. PRN Librium and Vistaril was given for patient's withdrawal symptoms (aching, anxiety, sweats, and itching).   R: Patient is receptive and cooperative but does not relay much to Retail banker. Patient reported relief from headache and earache. Q15 minute safety checks are maintained and patient is safe at this time.

## 2013-08-05 NOTE — [facility] Group Notes (Signed)
[facility] LCSW Aftercare Discharge Planning Group Note   08/05/2013 9:37 AM  Participation Quality:  Appropriate   Mood/Affect:  Depressed and Tearful  Depression Rating:  10  Anxiety Rating:  10  Thoughts of Suicide:  Yes Will you contract for safety?   Yes  Current AVH:  No  Plan for Discharge/Comments:  Pt has [facility] screening and possible admission on Tues 11/4. She plans to follow up at [facility] for med management.Pt highly anxious about d/cing prior to this date. She is endorsing passive SI and presents with depressed mood and anxious/tearful affect. Pt reports ruminating and racing thoughts that she feels causes impulsive behaviors.   Transportation Means: bus   Supports: none identified "My whole family hates me right now. They are really mad that I did this to myself again."   Smart, [employer]

## 2013-08-05 NOTE — Progress Notes (Addendum)
Recreation Therapy Notes  Date: 10.28.2014 Time: 3:00pm Location: [address] Dayroom  Group Topic: Self-Esteem  Goal Area(s) Addresses:  Patient will identify positive ways to increase self-esteem. Patient will verbalize benefit of increased self-esteem.  Behavioral Response:  Appropriate, Drowsy  Intervention: Game  Activity: Self-esteem Spin the Bottle. LRT provided a empty water bottle with a + on one end and a - on the other. In turn patients spun the bottle, if the bottle stopped facing the patient showing a + sign patients were asked to state two strengths about themselves. If the bottle stopped facing the patient showing a - patients were asked to state two things they would like to work on.   Education:  Self-esteem, Discharge Planning.   Education Outcome: Acknowledges understanding  Clinical Observations/Feedback: Patient engaged in activity when called on, when patient was not actively engaged in group activity she was observed to appear to sleep, as she sat covered in a blanket with her eyes closed. Patient was asked to identify areas of improvement, patient identified that she needs to utilize her sponsor when she feels like using and that she would like to pray more. Patient made no additional contributions to group discussion and it is unclear if she was listening as she appeared to sleep.   Marykay Lex Jenney Brester, LRT/CTRS  Venson Ferencz L 08/05/2013 10:06 AM
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
## 2013-08-05 NOTE — Progress Notes (Signed)
D: Pt has participated appropriately in all unit activities.Pt feels that her mood is improving. Denies SI,HI, & AVH @ this time. A: Supported & encouraged. Continues on 15 minute checks. R: Pt safety maintained.

## 2013-08-06 MED ORDER — DOCUSATE SODIUM 100 MG PO CAPS
100.0000 mg | ORAL_CAPSULE | Freq: Two times a day (BID) | ORAL | Status: DC
  Administered 2013-08-06 – 2013-08-10 (×10): 100 mg via ORAL
  Filled 2013-08-06 (×4): qty 1
  Filled 2013-08-06: qty 28
  Filled 2013-08-06 (×7): qty 1
  Filled 2013-08-06: qty 28

## 2013-08-06 MED ORDER — HYDROXYZINE HCL 25 MG PO TABS
25.0000 mg | ORAL_TABLET | Freq: Four times a day (QID) | ORAL | Status: AC | PRN
  Administered 2013-08-06 – 2013-08-08 (×4): 25 mg via ORAL
  Filled 2013-08-06 (×6): qty 1

## 2013-08-06 MED ORDER — CHLORDIAZEPOXIDE HCL 25 MG PO CAPS
ORAL_CAPSULE | ORAL | Status: AC
  Administered 2013-08-06: 25 mg
  Filled 2013-08-06: qty 1

## 2013-08-06 MED ORDER — GABAPENTIN 300 MG PO CAPS
600.0000 mg | ORAL_CAPSULE | Freq: Four times a day (QID) | ORAL | Status: AC
Start: 2013-08-06 — End: 2013-08-07
  Administered 2013-08-06 – 2013-08-07 (×6): 600 mg via ORAL
  Filled 2013-08-06 (×11): qty 2

## 2013-08-06 NOTE — Progress Notes (Signed)
[facility] MD Progress Note  08/06/2013 6:08 PM Jacqueline [name]  MRN:  [id number] Subjective:  Ceriah is trying to get herself back together. States she wants to quit drinking so bad that she does not understand why she cant. States everything was going well at the half way house, going to meetings etc, but once she went to work it was too much for her to deal with. She started "panicking' and went to the pharmacy and got big bottle of Listerine.  Diagnosis:   DSM5: Schizophrenia Disorders:  None Obsessive-Compulsive Disorders:  None Trauma-Stressor Disorders:  Posttraumatic Stress Disorder (309.81) Substance/Addictive Disorders:  Alcohol Related Disorder - Severe (303.90), Cocaine related disorder Depressive Disorders:  Major Depressive Disorder - Severe (296.23)  Axis I: Anxiety Disorder NOS  ADL's:  Intact  Sleep: Poor  Appetite:  Poor  Suicidal Ideation:  Plan:  denies Intent:  denies Means:  denies Homicidal Ideation:  Plan:  denies Intent:  denies Means:  denies AEB (as evidenced by):  Psychiatric Specialty Exam: Review of Systems  Constitutional: Positive for malaise/fatigue.  Eyes: Negative.   Respiratory: Negative.   Cardiovascular: Negative.   Gastrointestinal: Negative.   Genitourinary: Negative.   Musculoskeletal: Negative.   Skin: Negative.   Neurological: Positive for headaches.  Endo/Heme/Allergies: Negative.   Psychiatric/Behavioral: Positive for depression and substance abuse. The patient is nervous/anxious and has insomnia.     Blood pressure 107/72, pulse 83, temperature 98.3 F (36.8 C), temperature source Oral, resp. rate 16, height 5\' 7"  (1.702 m), weight 84.369 kg (186 lb), last menstrual period 07/18/2013.Body mass index is 29.12 kg/(m^2).  General Appearance: Fairly Groomed  Patent attorney::  Fair  Speech:  Clear and Coherent  Volume:  fluctuates  Mood:  Anxious, Depressed and worried, fearful  Affect:  sad. worried, anxious  Thought Process:   Coherent and Goal Directed  Orientation:  Full (Time, Place, and Person)  Thought Content:  Rumination and worries, concerns, fear of relapsing  Suicidal Thoughts:  Intermittent when feeling hopeless  Homicidal Thoughts:  No  Memory:  Immediate;   Fair Recent;   Fair Remote;   Fair  Judgement:  Fair  Insight:  Present and superficial  Psychomotor Activity:  Restlessness  Concentration:  Fair  Recall:  Fair  Akathisia:  No  Handed:    AIMS (if indicated):     Assets:  Desire for Improvement  Sleep:  Number of Hours: 5.75   Current Medications: Current Facility-Administered Medications  Medication Dose Route Frequency Provider Last Rate Last Dose  . acetaminophen (TYLENOL) tablet 650 mg  650 mg Oral Q6H PRN Court Joy, PA-C   650 mg at 08/06/13 1655  . alum & mag hydroxide-simeth (MAALOX/MYLANTA) 200-200-20 MG/5ML suspension 30 mL  30 mL Oral Q4H PRN Court Joy, PA-C      . amoxicillin-clavulanate (AUGMENTIN) 875-125 MG per tablet 1 tablet  1 tablet Oral Q12H Nanine Means, NP   1 tablet at 08/06/13 0829  . buPROPion (WELLBUTRIN XL) 24 hr tablet 150 mg  150 mg Oral Daily Nanine Means, NP   150 mg at 08/06/13 0830  . busPIRone (BUSPAR) tablet 10 mg  10 mg Oral TID Nanine Means, NP   10 mg at 08/06/13 1655  . docusate sodium (COLACE) capsule 100 mg  100 mg Oral BID Kerry Hough, PA-C   100 mg at 08/06/13 1655  . feeding supplement (ENSURE COMPLETE) (ENSURE COMPLETE) liquid 237 mL  237 mL Oral QHS Lavena Bullion, RD  237 mL at 08/04/13 1958  . gabapentin (NEURONTIN) capsule 600 mg  600 mg Oral QID Rachael Fee, MD   600 mg at 08/06/13 1655  . hydrOXYzine (ATARAX/VISTARIL) tablet 25 mg  25 mg Oral Q6H PRN Rachael Fee, MD   25 mg at 08/06/13 1655  . ibuprofen (ADVIL,MOTRIN) tablet 600 mg  600 mg Oral Q6H PRN Rachael Fee, MD   600 mg at 08/06/13 7829  . levothyroxine (SYNTHROID, LEVOTHROID) tablet 125 mcg  125 mcg Oral QAC breakfast Rachael Fee, MD   125 mcg at 08/06/13  [phone]  . lithium carbonate capsule 300 mg  300 mg Oral BID WC Nanine Means, NP   300 mg at 08/06/13 1655  . magnesium hydroxide (MILK OF MAGNESIA) suspension 30 mL  30 mL Oral Daily PRN Court Joy, PA-C      . multivitamin with minerals tablet 1 tablet  1 tablet Oral Daily Court Joy, PA-C   1 tablet at 08/06/13 3086  . nicotine (NICODERM CQ - dosed in mg/24 hours) patch 14 mg  14 mg Transdermal Q0600 Rachael Fee, MD   14 mg at 08/06/13 0650  . pantoprazole (PROTONIX) EC tablet 40 mg  40 mg Oral BID Rachael Fee, MD   40 mg at 08/06/13 1655  . thiamine (B-1) injection 100 mg  100 mg Intramuscular Once Court Joy, PA-C      . thiamine (VITAMIN B-1) tablet 100 mg  100 mg Oral Daily Court Joy, PA-C   100 mg at 08/06/13 5784    Lab Results: No results found for this or any previous visit (from the past 48 hour(s)).  Physical Findings: AIMS: Facial and Oral Movements Muscles of Facial Expression: None, normal Lips and Perioral Area: None, normal Jaw: None, normal Tongue: None, normal,Extremity Movements Upper (arms, wrists, hands, fingers): None, normal Lower (legs, knees, ankles, toes): None, normal, Trunk Movements Neck, shoulders, hips: None, normal, Overall Severity Severity of abnormal movements (highest score from questions above): None, normal Incapacitation due to abnormal movements: None, normal Patient's awareness of abnormal movements (rate only patient's report): No Awareness, Dental Status Current problems with teeth and/or dentures?: No Does patient usually wear dentures?: No  CIWA:  CIWA-Ar Total: 1 COWS:  COWS Total Score: 0  Treatment Plan Summary: Daily contact with patient to assess and evaluate symptoms and progress in treatment Medication management  Plan: Supportive approach/coping skills           Continue to optimize response to psychotropics: increase the Neuronin to 600 mg QID            Will optimize response with Wellbutrin and Buspar             CBT/mindfulenss                                                                                 Medical Decision Making Problem Points:  Review of psycho-social stressors (1) Data Points:  Review of medication regiment & side effects (2) Review of new medications or change in dosage (2)  I certify that inpatient services furnished can reasonably be expected to improve the patient's condition.  Jacqueline [name] 08/06/2013, 6:08 PM

## 2013-08-06 NOTE — Progress Notes (Signed)
Adult Psychoeducational Group Note  Date:  08/06/2013 Time:  10:02 AM  Group Topic/Focus:  Wellness Toolbox:   The focus of this group is to discuss various aspects of wellness, balancing those aspects and exploring ways to increase the ability to experience wellness.  Patients will create a wellness toolbox for use upon discharge.  Participation Level:  Active  Participation Quality:  Appropriate  Affect:  Appropriate  Cognitive:  Appropriate  Insight: Appropriate  Engagement in Group:  Engaged  Modes of Intervention:  Discussion  Additional Comments:  Pt attended Morning Wellness Group with RN. Pt was appropriate and participated with peers. Pt goal for today is to be happy and have positive outlook.   Viyaan Champine A 08/06/2013, 10:02 AM

## 2013-08-06 NOTE — Progress Notes (Signed)
Adult Psychoeducational Group Note  Date:  08/06/2013 Time:  1:56 PM  Group Topic/Focus:  Self Esteem Action Plan:   The focus of this group is to help patients create a plan to continue to build self-esteem after discharge.  Participation Level:  Active  Participation Quality:  Appropriate  Affect:  Appropriate  Cognitive:  Appropriate  Insight: Appropriate  Engagement in Group:  Engaged  Modes of Intervention:  Activity and Discussion  Additional Comments:  Pt attended Self Esteem group this morning. Pt played a game with peers naming a word for every letter in the alphabet dealing with self esteem. Pt discussed with peers things that bring up their self esteem and things that bring down their self esteem. Pt was appropriate and participated.     Kayda Allers A 08/06/2013, 1:56 PM

## 2013-08-06 NOTE — [facility] Group Notes (Signed)
[facility] LCSW Group Therapy  08/06/2013 3:24 PM  Type of Therapy:  Group Therapy  Participation Level:  Active  Participation Quality:  Attentive  Affect:  Depressed  Cognitive:  Lacking  Insight:  Limited  Engagement in Therapy:  Engaged and Monopolizing  Modes of Intervention:  Discussion, Education, Exploration, Socialization and Support  Summary of Progress/Problems:  Finding Balance in Life. Today's group focused on defining balance in one's own words, identifying things that can knock one off balance, and exploring healthy ways to maintain balance in life. Group members were asked to provide an example of a time when they felt off balance, describe how they handled that situation,and process healthier ways to regain balance in the future. Group members were asked to share the most important tool for maintaining balance that they learned while at [facility] and how they plan to apply this method after discharge. Jacqueline [name] was attentive and engaged throughout today's therapy group. She shows progress in the group setting AEB her ability to remain attentive and actively participate in group discussion. Jacqueline [name] shared that she feels consistently off balance and believes that at this time she is "unable to function in society." Jacqueline [name] shared her recent experience with MI and SA relapse with the group. Jacqueline [name] shows limited insight AEB her inability to pinpoint a trigger for why she decided to get a beer rather than show up to work. Jacqueline [name] lacks insight and presents with anxious mood and excited affect. She presents with manic symptoms and is tangential in thinking at this time. Jacqueline [name] tends to monopolize group discussion but is easily redirectable by facilitator.    Smart, [name] 08/06/2013, 3:24 PM

## 2013-08-06 NOTE — Progress Notes (Signed)
Patient ID: Jacqueline [name], female   DOB: 1966-10-08, 47 y.o.   MRN: [id number] She has been up and to groups has been interacting more with peers and staff. Says that she is anxious and in  Prn medications have been given.  She has not c/o and rt ear pain today. Just of a  Headache.

## 2013-08-07 DIAGNOSIS — F411 Generalized anxiety disorder: Secondary | ICD-10-CM

## 2013-08-07 DIAGNOSIS — F39 Unspecified mood [affective] disorder: Secondary | ICD-10-CM

## 2013-08-07 MED ORDER — GABAPENTIN 400 MG PO CAPS
800.0000 mg | ORAL_CAPSULE | Freq: Three times a day (TID) | ORAL | Status: DC
  Administered 2013-08-08 – 2013-08-10 (×9): 800 mg via ORAL
  Filled 2013-08-07 (×15): qty 2

## 2013-08-07 MED ORDER — BUPROPION HCL ER (XL) 300 MG PO TB24
300.0000 mg | ORAL_TABLET | Freq: Every day | ORAL | Status: DC
  Administered 2013-08-08 – 2013-08-10 (×3): 300 mg via ORAL
  Filled 2013-08-07: qty 14
  Filled 2013-08-07 (×4): qty 1

## 2013-08-07 MED ORDER — BUSPIRONE HCL 15 MG PO TABS
15.0000 mg | ORAL_TABLET | Freq: Three times a day (TID) | ORAL | Status: DC
  Administered 2013-08-07 – 2013-08-10 (×9): 15 mg via ORAL
  Filled 2013-08-07: qty 1
  Filled 2013-08-07 (×2): qty 3
  Filled 2013-08-07 (×2): qty 1
  Filled 2013-08-07: qty 42
  Filled 2013-08-07 (×7): qty 1
  Filled 2013-08-07: qty 42
  Filled 2013-08-07: qty 1
  Filled 2013-08-07: qty 42

## 2013-08-07 MED ORDER — PSEUDOEPHEDRINE HCL ER 120 MG PO TB12
120.0000 mg | ORAL_TABLET | Freq: Two times a day (BID) | ORAL | Status: DC | PRN
  Administered 2013-08-07 – 2013-08-11 (×6): 120 mg via ORAL
  Filled 2013-08-07 (×6): qty 1

## 2013-08-07 NOTE — Progress Notes (Signed)
D   Pt is depressed and sad  She reports increased anxiety and feels discouraged   She has isolated to her room and did not attend group   Her interaction with others is minimal A   Verbal support given   Medications administered and effectiveness   Q 15 min checks   R   Pt safe at present

## 2013-08-07 NOTE — Progress Notes (Signed)
[facility] MD Progress Note  08/07/2013 7:29 PM Jacqueline [name]  MRN:  [id number] Subjective:  Jacqueline [name] is still endorsing anxiety, depression, worry. She is also having a hard time with congestion that does not let her breath. She states she is worried of not having the structure of a rehab and relapsing and things getting worst for her Diagnosis:   DSM5: Schizophrenia Disorders:  none Obsessive-Compulsive Disorders:  none Trauma-Stressor Disorders:  Posttraumatic Stress Disorder (309.81) Substance/Addictive Disorders:  Alcohol Related Disorder - Severe (303.90), Cocaine related disorder Depressive Disorders:  Major Depressive Disorder - Moderate (296.22)  Axis I: Anxiety Disorder NOS and Mood Disorder NOS  ADL's:  Intact  Sleep: Fair  Appetite:  Fair  Suicidal Ideation:  Plan:  denies Intent:  denies Means:  denies Homicidal Ideation:  Plan:  denies Intent:  denies Means:  denies AEB (as evidenced by):  Psychiatric Specialty Exam: Review of Systems  Constitutional: Negative.   Eyes: Negative.   Respiratory: Negative.   Cardiovascular: Negative.   Gastrointestinal: Negative.   Genitourinary: Negative.   Musculoskeletal: Negative.   Skin: Negative.   Neurological: Positive for headaches.  Endo/Heme/Allergies: Negative.   Psychiatric/Behavioral: Positive for depression and substance abuse. The patient is nervous/anxious.     Blood pressure 111/75, pulse 90, temperature 98.7 F (37.1 C), temperature source Oral, resp. rate 18, height 5\' 7"  (1.702 m), weight 84.369 kg (186 lb), last menstrual period 07/18/2013.Body mass index is 29.12 kg/(m^2).  General Appearance: Fairly Groomed  Patent attorney::  Fair  Speech:  Clear and Coherent and Slow  Volume:  Decreased  Mood:  Anxious and Depressed  Affect:  Restricted  Thought Process:  Coherent and Goal Directed  Orientation:  Full (Time, Place, and Person)  Thought Content:  symptoms, worries, concerns  Suicidal Thoughts:  Yes.   without intent/plan, when hopeless  Homicidal Thoughts:  No  Memory:  Immediate;   Fair Recent;   Fair Remote;   Fair  Judgement:  Fair  Insight:  Present  Psychomotor Activity:  Restlessness  Concentration:  Fair  Recall:  Fair  Akathisia:  No  Handed:    AIMS (if indicated):     Assets:  Desire for Improvement  Sleep:  Number of Hours: 5   Current Medications: Current Facility-Administered Medications  Medication Dose Route Frequency Provider Last Rate Last Dose  . acetaminophen (TYLENOL) tablet 650 mg  650 mg Oral Q6H PRN Court Joy, PA-C   650 mg at 08/07/13 0800  . alum & mag hydroxide-simeth (MAALOX/MYLANTA) 200-200-20 MG/5ML suspension 30 mL  30 mL Oral Q4H PRN Court Joy, PA-C      . amoxicillin-clavulanate (AUGMENTIN) 875-125 MG per tablet 1 tablet  1 tablet Oral Q12H Nanine Means, NP   1 tablet at 08/07/13 0757  . [START ON 08/08/2013] buPROPion (WELLBUTRIN XL) 24 hr tablet 300 mg  300 mg Oral Daily Rachael Fee, MD      . busPIRone (BUSPAR) tablet 15 mg  15 mg Oral TID Rachael Fee, MD   15 mg at 08/07/13 1647  . docusate sodium (COLACE) capsule 100 mg  100 mg Oral BID Kerry Hough, PA-C   100 mg at 08/07/13 1648  . feeding supplement (ENSURE COMPLETE) (ENSURE COMPLETE) liquid 237 mL  237 mL Oral QHS Lavena Bullion, RD   237 mL at 08/06/13 1954  . gabapentin (NEURONTIN) capsule 600 mg  600 mg Oral QID Rachael Fee, MD   600 mg at 08/07/13 1648  . [  START ON 08/08/2013] gabapentin (NEURONTIN) capsule 800 mg  800 mg Oral TID Rachael Fee, MD      . hydrOXYzine (ATARAX/VISTARIL) tablet 25 mg  25 mg Oral Q6H PRN Rachael Fee, MD   25 mg at 08/07/13 1139  . ibuprofen (ADVIL,MOTRIN) tablet 600 mg  600 mg Oral Q6H PRN Rachael Fee, MD   600 mg at 08/07/13 1141  . levothyroxine (SYNTHROID, LEVOTHROID) tablet 125 mcg  125 mcg Oral QAC breakfast Rachael Fee, MD   125 mcg at 08/07/13 [phone]  . lithium carbonate capsule 300 mg  300 mg Oral BID WC Nanine Means, NP   300  mg at 08/07/13 1648  . magnesium hydroxide (MILK OF MAGNESIA) suspension 30 mL  30 mL Oral Daily PRN Court Joy, PA-C      . multivitamin with minerals tablet 1 tablet  1 tablet Oral Daily Court Joy, PA-C   1 tablet at 08/06/13 9604  . nicotine (NICODERM CQ - dosed in mg/24 hours) patch 14 mg  14 mg Transdermal Q0600 Rachael Fee, MD   14 mg at 08/07/13 0608  . pantoprazole (PROTONIX) EC tablet 40 mg  40 mg Oral BID Rachael Fee, MD   40 mg at 08/07/13 1653  . pseudoephedrine (SUDAFED) 12 hr tablet 120 mg  120 mg Oral Q12H PRN Rachael Fee, MD   120 mg at 08/07/13 1650  . thiamine (B-1) injection 100 mg  100 mg Intramuscular Once Court Joy, PA-C      . thiamine (VITAMIN B-1) tablet 100 mg  100 mg Oral Daily Court Joy, PA-C   100 mg at 08/07/13 5409    Lab Results: No results found for this or any previous visit (from the past 48 hour(s)).  Physical Findings: AIMS: Facial and Oral Movements Muscles of Facial Expression: None, normal Lips and Perioral Area: None, normal Jaw: None, normal Tongue: None, normal,Extremity Movements Upper (arms, wrists, hands, fingers): None, normal Lower (legs, knees, ankles, toes): None, normal, Trunk Movements Neck, shoulders, hips: None, normal, Overall Severity Severity of abnormal movements (highest score from questions above): None, normal Incapacitation due to abnormal movements: None, normal Patient's awareness of abnormal movements (rate only patient's report): No Awareness, Dental Status Current problems with teeth and/or dentures?: No Does patient usually wear dentures?: No  CIWA:  CIWA-Ar Total: 1 COWS:  COWS Total Score: 0  Treatment Plan Summary: Daily contact with patient to assess and evaluate symptoms and progress in treatment Medication management  Plan: Supporitve approach/coping skills/relapse prevention           CBT/Mindfulness           Optimize treatment with psychotropics           Change the Neurontin  to 800 mg TID           Increase the Wellbutrin XL 300 mg in AM                                   Buspar 15 mg TID  Medical Decision Making Problem Points:  Review of psycho-social stressors (1) Data Points:  Review of medication regiment & side effects (2) Review of new medications or change in dosage (2)  I certify that inpatient services furnished can reasonably be expected to improve the patient's condition.   Bentleigh Stankus A 08/07/2013, 7:29 PM

## 2013-08-07 NOTE — Tx Team (Signed)
Interdisciplinary Treatment Plan Update (Adult)  Date: 08/07/2013   Time Reviewed: 11:48 AM  Progress in Treatment:  Attending groups: No.  Participating in groups:  No.  Taking medication as prescribed: Yes  Tolerating medication: Yes  Family/Significant othe contact made: Pt refusing to consent to family contact. SPE completed with pt.   Patient understands diagnosis: Yes, AEB seeking treatment for ETOH detox, medication management, SI with plan, and mood stabilization.  Discussing patient identified problems/goals with staff: Yes  Medical problems stabilized or resolved: Yes  Denies suicidal/homicidal ideation: yes, during group/self report.  Patient has not harmed self or Others: Yes  New problem(s) identified:  Discharge Plan or Barriers: Pt has [facility] screening and possible admit on Tues 11/4. She plans to follow up at [facility] for med management.  Additional comments: Reason for Continuation of Hospitalization: Librium taper-withdrawals Mood stabilization Medication management Estimated length of stay: 3 days (d/c Tues AM) For review of initial/current patient goals, please see plan of care.  Attendees:  Patient:    Family:    Physician: Geoffery Lyons MD 08/07/2013 11:48 AM   Nursing: Lowanda Foster RN     08/07/2013 11:48 AM   Clinical Social Worker Lara Palinkas Smart, LCSWA  08/07/2013 11:48 AM   Other: Mardella Layman RN 08/07/2013 11:48 AM   Other:  08/07/2013 11:48 AM   Other: Darden Dates Nurse CM  08/07/2013 11:48 AM   Other:  08/07/2013 11:48 AM   Scribe for Treatment Team:  Herbert Seta Smart LCSWA 08/07/2013 11:48 AM

## 2013-08-07 NOTE — [facility] Group Notes (Signed)
Adult Psychoeducational Group Note  Date:  08/07/2013 Time:  11:41 PM  Group Topic/Focus:  AA Meeting  Participation Level:  Did Not Attend  Participation Quality:  None  Affect:  None  Cognitive:  None  Insight: None  Engagement in Group: None  Modes of Intervention:  Discussion and Education  Additional Comments:  [name] did not attend group.  Jacqueline [name] A 08/07/2013, 11:41 PM

## 2013-08-07 NOTE — Progress Notes (Signed)
Patient ID: Jacqueline [name], female   DOB: [birth year], 47 y.o.   MRN: [id number] D. Patient presents with depressed, and anxious mood; affect congruent. Patient states '' Well I'm supposed to be staying here throughout the weekend, before going to [facility] and I'm glad, I know I need the support or I'd just go back to drinking '' Patient completed self inventory and rates depression at 7/10 on depression scale, 10 being worst depression 1 being least. She also continues to endorse fleeting suicidal ideation, but is able to contract for safety. A. Allowed patient to ventilate, support and encouragement provided. Medications given as ordered. Discussed above information with MD/Treatment team. R. Patient remains cooperative at this time, no further voiced concerns at this time. Will continue to monitor q 15 minutes for safety.

## 2013-08-07 NOTE — [facility] Group Notes (Signed)
[facility] LCSW Aftercare Discharge Planning Group Note   08/07/2013 9:31 AM  Participation Quality:  Appropriate   Mood/Affect:  Depressed  Depression Rating:  3  Anxiety Rating:  3  Thoughts of Suicide:  No Will you contract for safety?   NA  Current AVH:  No  Plan for Discharge/Comments:  Pt stated that she is happy to have [facility] screening of 11/4 (tuesday) and is hoping for a straight admission. She plans to d/c Tues morning and take the bus to [facility]. Pt will follow up at [facility] for med management. Pt reports that she slept well last night and feels that meds are beginning to assist with mood stabilization but "more adjustments probably need to be made."   Transportation Means: bus   Supports: none identified   Smart, [employer]

## 2013-08-07 NOTE — [facility] Group Notes (Signed)
[facility] LCSW Group Therapy  08/07/2013 1:50 PM  Type of Therapy:  Group Therapy  Participation Level:  None  Participation Quality:  Drowsy  Affect:  Lethargic  Cognitive:  Lacking  Insight:  None  Engagement in Therapy:  None  Modes of Intervention:  Discussion, Education, Exploration, Socialization and Support  Summary of Progress/Problems: Feelings around Relapse. Group members discussed the meaning of relapse and shared personal stories of relapse, how it affected them and others, and how they perceived themselves during this time. Group members were encouraged to identify triggers, warning signs and coping skills used when facing the possibility of relapse. Social supports were discussed and explored in detail. Jacqueline [name] was disengaged and inattentive throughout today's therapy group. She presented with depressed mood and lethargic affect. Jacqueline [name] quickly fell asleep during today's session and remained asleep throughout group. Jacqueline [name] explained that her medications were making her extremely lethargic. At this time, Jacqueline [name] does not show progress in the group setting.    Smart, Jacqueline [name] 08/07/2013, 1:50 PM

## 2013-08-07 NOTE — Progress Notes (Signed)
D.  Pt in bed on approach, did not feel well enough to attend AA group this evening.  Pt reports anxiety and general malaise.  Denies SI/HI/hallucinations at this time.  Has remained in bed for duration of evening.  No acute distress noted.  A.  Support and encouragement offered.  Medication given as ordered  R.  Pt remains safe on unit, will continue to monitor.

## 2013-08-07 NOTE — Progress Notes (Signed)
Patient did not attend the evening karaoke group. Pt remained in bed reporting nausea.

## 2013-08-08 MED ORDER — HYDROXYZINE HCL 50 MG PO TABS
50.0000 mg | ORAL_TABLET | Freq: Every evening | ORAL | Status: DC | PRN
  Administered 2013-08-08 – 2013-08-11 (×5): 50 mg via ORAL
  Filled 2013-08-08 (×4): qty 1
  Filled 2013-08-08: qty 28

## 2013-08-08 NOTE — Progress Notes (Signed)
D: Patient denies SI/HI/AVH. Patient rates hopelessness as 1 and depression as 2.  Patient affect is anxious. Mood is anxious.  Pt states that she slept fair and her appetite is improving.  Pt states that her energy level is normal. Pt states that her plans are to go to [facility] upon discharge. Patient visible on the milieu. No distress noted. A: Support and encouragement offered. Scheduled medications given to pt. Q 15 min checks continued for patient safety. R: Patient receptive. Patient remains safe on the unit.

## 2013-08-08 NOTE — Progress Notes (Signed)
Patient ID: Jacqueline [name], female   DOB: 12/25/1965, 47 y.o.   MRN: [id number] Psychoeducational Group Note  Date:  08/08/2013 Time:0930am  Group Topic/Focus:  Identifying Needs:   The focus of this group is to help patients identify their personal needs that have been historically problematic and identify healthy behaviors to address their needs.  Participation Level:  Active  Participation Quality:  Appropriate  Affect:  Appropriate  Cognitive:  Appropriate  Insight:  Supportive  Engagement in Group:  Supportive  Additional Comments:  Inventory and Psychoeducational group   Valente David 08/08/2013,11:17 AM

## 2013-08-08 NOTE — Progress Notes (Signed)
D.  Pt pleasant on approach, denies complaints at this time.  States that her eye feels much better tonight.  Positive for evening wrap up group.  Interacting appropriately with peers on the unit.  A.  Support and encouragement offered  R. Pt remains safe, will continue to monitor.

## 2013-08-08 NOTE — Clinical Social Work Note (Signed)
Clinical Social Work Note  At [employer] request, CSW provided a pair of pants and a shirt from the clothing closet.  Ambrose Mantle, LCSW 08/08/2013, 1:34 PM

## 2013-08-08 NOTE — Progress Notes (Signed)
Patient ID: [name], female   DOB: 1966-02-20, 47 y.o.   MRN: [id number] [facility] MD Progress Note  08/08/2013 12:02 PM KASHONDA SARKISYAN  MRN:  [id number] Subjective:  Anaid endorses anxiety but somewhat better mood wise. Understand takes time to feel better. Tolerating medications and attending groups. No psychotic symptoms. Diagnosis:   DSM5: Schizophrenia Disorders:  none Obsessive-Compulsive Disorders:  none Trauma-Stressor Disorders:  Posttraumatic Stress Disorder (309.81) Substance/Addictive Disorders:  Alcohol Related Disorder - Severe (303.90), Cocaine related disorder Depressive Disorders:  Major Depressive Disorder - Moderate (296.22)  Axis I: Anxiety Disorder NOS and Mood Disorder NOS  ADL's:  Intact  Sleep: Fair  Appetite:  Fair  Suicidal Ideation:  Plan:  denies Intent:  denies Means:  denies Homicidal Ideation:  Plan:  denies Intent:  denies Means:  denies AEB (as evidenced by):  Psychiatric Specialty Exam: Review of Systems  Constitutional: Negative.   Eyes: Negative.   Respiratory: Negative.   Cardiovascular: Negative.   Gastrointestinal: Negative.   Genitourinary: Negative.   Musculoskeletal: Negative.   Skin: Negative.   Neurological: Positive for headaches.  Endo/Heme/Allergies: Negative.   Psychiatric/Behavioral: Positive for depression and substance abuse. The patient is nervous/anxious.     Blood pressure 102/71, pulse 109, temperature 97.9 F (36.6 C), temperature source Oral, resp. rate 16, height 5\' 7"  (1.702 m), weight 84.369 kg (186 lb), last menstrual period 07/18/2013.Body mass index is 29.12 kg/(m^2).  General Appearance: Fairly Groomed  Patent attorney::  Fair  Speech:  Clear and Coherent and Slow  Volume:  Decreased  Mood:  Anxious and Depressed  Affect:  Restricted  Thought Process:  Coherent and Goal Directed  Orientation:  Full (Time, Place, and Person)  Thought Content:  symptoms, worries, concerns  Suicidal Thoughts:   Yes.  without intent/plan, when hopeless  Homicidal Thoughts:  No  Memory:  Immediate;   Fair Recent;   Fair Remote;   Fair  Judgement:  Fair  Insight:  Present  Psychomotor Activity:  Restlessness  Concentration:  Fair  Recall:  Fair  Akathisia:  No  Handed:    AIMS (if indicated):     Assets:  Desire for Improvement  Sleep:  Number of Hours: 6.5   Current Medications: Current Facility-Administered Medications  Medication Dose Route Frequency Provider Last Rate Last Dose  . acetaminophen (TYLENOL) tablet 650 mg  650 mg Oral Q6H PRN Court Joy, PA-C   650 mg at 08/08/13 1118  . alum & mag hydroxide-simeth (MAALOX/MYLANTA) 200-200-20 MG/5ML suspension 30 mL  30 mL Oral Q4H PRN Court Joy, PA-C      . amoxicillin-clavulanate (AUGMENTIN) 875-125 MG per tablet 1 tablet  1 tablet Oral Q12H Nanine Means, NP   1 tablet at 08/08/13 0811  . buPROPion (WELLBUTRIN XL) 24 hr tablet 300 mg  300 mg Oral Daily Rachael Fee, MD   300 mg at 08/08/13 7829  . busPIRone (BUSPAR) tablet 15 mg  15 mg Oral TID Rachael Fee, MD   15 mg at 08/08/13 1119  . docusate sodium (COLACE) capsule 100 mg  100 mg Oral BID Kerry Hough, PA-C   100 mg at 08/08/13 5621  . feeding supplement (ENSURE COMPLETE) (ENSURE COMPLETE) liquid 237 mL  237 mL Oral QHS Lavena Bullion, RD   237 mL at 08/07/13 2006  . gabapentin (NEURONTIN) capsule 800 mg  800 mg Oral TID Rachael Fee, MD   800 mg at 08/08/13 1120  . hydrOXYzine (ATARAX/VISTARIL) tablet 25  mg  25 mg Oral Q6H PRN Rachael Fee, MD   25 mg at 08/07/13 2125  . ibuprofen (ADVIL,MOTRIN) tablet 600 mg  600 mg Oral Q6H PRN Rachael Fee, MD   600 mg at 08/08/13 0533  . levothyroxine (SYNTHROID, LEVOTHROID) tablet 125 mcg  125 mcg Oral QAC breakfast Rachael Fee, MD   125 mcg at 08/08/13 0531  . lithium carbonate capsule 300 mg  300 mg Oral BID WC Nanine Means, NP   300 mg at 08/08/13 0811  . magnesium hydroxide (MILK OF MAGNESIA) suspension 30 mL  30 mL Oral  Daily PRN Court Joy, PA-C      . multivitamin with minerals tablet 1 tablet  1 tablet Oral Daily Court Joy, PA-C   1 tablet at 08/06/13 1610  . nicotine (NICODERM CQ - dosed in mg/24 hours) patch 14 mg  14 mg Transdermal Q0600 Rachael Fee, MD   14 mg at 08/08/13 0531  . pantoprazole (PROTONIX) EC tablet 40 mg  40 mg Oral BID Rachael Fee, MD   40 mg at 08/08/13 9604  . pseudoephedrine (SUDAFED) 12 hr tablet 120 mg  120 mg Oral Q12H PRN Rachael Fee, MD   120 mg at 08/08/13 0533  . thiamine (B-1) injection 100 mg  100 mg Intramuscular Once Court Joy, PA-C      . thiamine (VITAMIN B-1) tablet 100 mg  100 mg Oral Daily Court Joy, PA-C   100 mg at 08/08/13 5409    Lab Results: No results found for this or any previous visit (from the past 48 hour(s)).  Physical Findings: AIMS: Facial and Oral Movements Muscles of Facial Expression: None, normal Lips and Perioral Area: None, normal Jaw: None, normal Tongue: None, normal,Extremity Movements Upper (arms, wrists, hands, fingers): None, normal Lower (legs, knees, ankles, toes): None, normal, Trunk Movements Neck, shoulders, hips: None, normal, Overall Severity Severity of abnormal movements (highest score from questions above): None, normal Incapacitation due to abnormal movements: None, normal Patient's awareness of abnormal movements (rate only patient's report): No Awareness, Dental Status Current problems with teeth and/or dentures?: No Does patient usually wear dentures?: No  CIWA:  CIWA-Ar Total: 1 COWS:  COWS Total Score: 0  Treatment Plan Summary: Daily contact with patient to assess and evaluate symptoms and progress in treatment Medication management  Plan: Supporitve approach/coping skills/relapse prevention           CBT/Mindfulness           Neurontin and Wellbutrin optimized yesterday. Continue current treatment plan  Medical Decision Making Problem Points:  Review of psycho-social stressors  (1) Data Points:  Review of medication regiment & side effects (2) Review of new medications or change in dosage (2)  I certify that inpatient services furnished can reasonably be expected to improve the patient's condition.   Raye Wiens 08/08/2013, 12:02 PM

## 2013-08-08 NOTE — [facility] Group Notes (Signed)
[facility] Group Notes:  (Clinical Social Work)  08/08/2013     10-11AM  Summary of Progress/Problems:   The main focus of today's process group was for the patient to identify ways in which they have in the past sabotaged their own recovery. Motivational Interviewing was utilized to ask the group members what they get out of their substance use, and what reasons they may have for wanting to change.  The Stages of Change were explained using a handout, and patients identified where they currently are with regard to stages of change.  The patient expressed that she was going to meetings and following the program while in her halfway house, and had 72 days sobriety.  However she got a job, which brought out a lot of fear and anxiety, and she started to drink again to get her anxiety under control.  Type of Therapy:  Group Therapy - Process   Participation Level:  Active  Participation Quality:  Attentive, Sharing and Supportive  Affect:  Depressed  Cognitive:  Appropriate and Oriented  Insight:  Engaged  Engagement in Therapy:  Engaged  Modes of Intervention:  Education, Teacher, English as a foreign language, Motivational Interviewing  Ambrose Mantle, LCSW 08/08/2013, 1:32 PM

## 2013-08-08 NOTE — Progress Notes (Signed)
Adult Psychoeducational Group Note  Date:  08/08/2013 Time:  8:27 PM  Group Topic/Focus:  Activity Ball   Participation Level:  Active  Participation Quality:  Appropriate  Affect:  Appropriate  Cognitive:  Appropriate  Insight: Appropriate  Engagement in Group:  Engaged  Modes of Intervention:  Activity and Discussion  Additional Comments:  Patient attended and participated in group. Today's group activity was the activity ball. Pt was asked to answer any question on the ball.   Sheba Whaling A 08/08/2013, 8:27 PM

## 2013-08-09 DIAGNOSIS — F332 Major depressive disorder, recurrent severe without psychotic features: Secondary | ICD-10-CM

## 2013-08-09 DIAGNOSIS — F1010 Alcohol abuse, uncomplicated: Secondary | ICD-10-CM

## 2013-08-09 DIAGNOSIS — F1994 Other psychoactive substance use, unspecified with psychoactive substance-induced mood disorder: Secondary | ICD-10-CM

## 2013-08-09 DIAGNOSIS — F1910 Other psychoactive substance abuse, uncomplicated: Secondary | ICD-10-CM

## 2013-08-09 MED ORDER — MAGNESIUM CITRATE PO SOLN
1.0000 | Freq: Once | ORAL | Status: AC | PRN
  Administered 2013-08-09: 1 via ORAL

## 2013-08-09 MED ORDER — FLEET ENEMA 7-19 GM/118ML RE ENEM
1.0000 | ENEMA | Freq: Once | RECTAL | Status: AC
  Administered 2013-08-09: 1 via RECTAL
  Filled 2013-08-09: qty 1

## 2013-08-09 MED ORDER — TRAZODONE HCL 50 MG PO TABS
50.0000 mg | ORAL_TABLET | Freq: Every day | ORAL | Status: DC
  Administered 2013-08-09: 50 mg via ORAL
  Filled 2013-08-09: qty 14
  Filled 2013-08-09 (×3): qty 1

## 2013-08-09 MED ORDER — BISACODYL 10 MG RE SUPP
10.0000 mg | Freq: Every day | RECTAL | Status: DC | PRN
  Administered 2013-08-10: 10 mg via RECTAL
  Filled 2013-08-09: qty 1
  Filled 2013-08-09: qty 2

## 2013-08-09 NOTE — Progress Notes (Signed)
[facility] Group Notes:  (Nursing/MHT/Case Management/Adjunct)  Date:  08/09/2013  Time:  8:00p.m.  Type of Therapy:  Psychoeducational Skills  Participation Level:  Active  Participation Quality:  Appropriate  Affect:  Appropriate  Cognitive:  Appropriate  Insight:  Appropriate  Engagement in Group:  Developing/Improving  Modes of Intervention:  Education  Summary of Progress/Problems: The patient mentioned that she had a good day since she felt better. She also mentioned that she had "fewer complaints" today. As a theme for the day, her coping skills included breathing in, taking slow deep breaths, and not reacting to things.   Jacqueline [name] S 08/09/2013, 4:07 AM

## 2013-08-09 NOTE — Progress Notes (Signed)
Patient did attend the evening speaker AA meeting.  

## 2013-08-09 NOTE — Progress Notes (Signed)
D-Pt sts she has trouble staying asleep, appetite is good, sts she is moderately depressed and moderate hopelessness, pt has high anxiety, c/o headache off and on, c/o constipation, pt requests to go [facility] A-Pt attends group and takes her medications, requests to go to [facility] on d/c R-cont. To monitor and maintain safety

## 2013-08-09 NOTE — Progress Notes (Signed)
D: Pt with blunted ,anxious affect & depressed mood. Pt continues to c/o constipation & was medicated with Mag. Citrate. Pt with passive SI but contracts for safety on the unit. Denies HI & AVH. Pt states that her RT ear is feeling better--on ABX.Pt takes medications as prescribed.A: Supported & encouraged. Continues on 15 minute checks.R: Pt safety maintained.

## 2013-08-09 NOTE — [facility] Group Notes (Signed)
[facility] Group Notes:  (Clinical Social Work)  08/09/2013  10:00-11:00AM  Summary of Progress/Problems:   The main focus of today's process group was to   identify the patient's current support system and decide on other supports that can be put in place.  The picture on workbook was used to discuss why additional supports are needed, and a hand-out was distributed with four definitions/levels of support, then used to talk about how patients have given and received all different kinds of support.  An emphasis was placed on using counselor, doctor, therapy groups, 12-step groups, and problem-specific support groups to expand supports.  The patient identified her sponsor as her sole support, talked at length about her mother and children "writing her off and not giving a s--- about her success in rehab."  Even as we discussed the different kinds of support, she was unwilling or unable to acknowledge that her family may still love and support her without enabling her any longer.  Type of Therapy:  Process Group with Motivational Interviewing  Participation Level:  Active  Participation Quality:  Attentive, Resistant and Sharing  Affect:  Irritable  Cognitive:  Oriented  Insight:  Developing/Improving  Engagement in Therapy:  Developing/Improving  Modes of Intervention:   Education, Support and Processing, Activity  Ambrose Mantle, LCSW 08/09/2013, 1:03 PM

## 2013-08-09 NOTE — Progress Notes (Signed)
[facility] MD Progress Note  08/09/2013 3:04 PM Jacqueline [name]  MRN:  [id number] Subjective:  Patient used an enema with little effect--new orders placed.  Sleep was poor, medications adjusted, somatic.  Depression was worse today with anxiety, denies suicidal/homicidal ideations.  [facility] planned for Tuesday. 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 Peptic ulcer   . Alcohol abuse   . Anemia   . Benzodiazepine abuse   . Thyroid disease   . Alcoholism   . Narcotic abuse   . Back pain   . Thrombocytopenia 06/17/2011  . Hypothyroidism   . Seizures   . Medical history non-contributory     hypoglycemic   Axis IV: other psychosocial or environmental problems, problems related to social environment and problems with primary support group Axis V: 41-50 serious symptoms  ADL's:  Intact  Sleep: Poor  Appetite:  Good  Suicidal Ideation:  Denies Homicidal Ideation:  Denies  Psychiatric Specialty Exam: Review of Systems  Constitutional: Negative.   HENT: Negative.   Eyes: Negative.   Respiratory: Negative.   Cardiovascular: Negative.   Gastrointestinal: Positive for constipation.  Genitourinary: Negative.   Musculoskeletal: Negative.   Skin: Negative.   Neurological: Negative.   Endo/Heme/Allergies: Negative.   Psychiatric/Behavioral: Positive for depression and substance abuse. The patient is nervous/anxious and has insomnia.     Blood pressure 106/69, pulse 116, temperature 98.9 F (37.2 C), temperature source Oral, resp. rate 16, height 5\' 7"  (1.702 m), weight 84.369 kg (186 lb), last menstrual period 07/18/2013.Body mass index is 29.12 kg/(m^2).  General Appearance:  Disheveled  Eye Solicitor::  Fair  Speech:  Normal Rate  Volume:  Normal  Mood:  Anxious and Depressed  Affect:  Congruent  Thought Process:  Coherent  Orientation:  Full (Time, Place, and Person)  Thought Content:  WDL  Suicidal Thoughts:  No  Homicidal Thoughts:  No  Memory:  Immediate;   Fair Recent;   Fair Remote;   Fair  Judgement:  Fair  Insight:  Lacking  Psychomotor Activity:  Decreased  Concentration:  Fair  Recall:  Fair  Akathisia:  No  Handed:  Right  AIMS (if indicated):     Assets:  Leisure Time Physical Health Resilience  Sleep:  Number of Hours: 7.5   Current Medications: Current Facility-Administered Medications  Medication Dose Route Frequency Provider Last Rate Last Dose  . acetaminophen (TYLENOL) tablet 650 mg  650 mg Oral Q6H PRN Court Joy, PA-C   650 mg at 08/08/13 1118  . alum & mag hydroxide-simeth (MAALOX/MYLANTA) 200-200-20 MG/5ML suspension 30 mL  30 mL Oral Q4H PRN Court Joy, PA-C      . amoxicillin-clavulanate (AUGMENTIN) 875-125 MG per tablet 1 tablet  1 tablet Oral Q12H Nanine Means, NP   1 tablet at 08/09/13 0750  . bisacodyl (DULCOLAX) suppository 10 mg  10 mg Rectal Daily PRN Nanine Means, NP      . buPROPion (WELLBUTRIN XL) 24 hr tablet 300 mg  300 mg Oral Daily Rachael Fee, MD   300 mg at 08/09/13 0750  . busPIRone (BUSPAR) tablet 15 mg  15 mg Oral TID Rachael Fee, MD   15 mg at 08/09/13 1134  . docusate sodium (COLACE) capsule  100 mg  100 mg Oral BID Kerry Hough, PA-C   100 mg at 08/09/13 0750  . feeding supplement (ENSURE COMPLETE) (ENSURE COMPLETE) liquid 237 mL  237 mL Oral QHS Lavena Bullion, RD   237 mL at 08/07/13 2006  . gabapentin (NEURONTIN) capsule 800 mg  800 mg Oral TID Rachael Fee, MD   800 mg at 08/09/13 1134  . hydrOXYzine (ATARAX/VISTARIL) tablet 50 mg  50 mg Oral QHS PRN,MR X 1 Nanine Means, NP   50 mg at 08/09/13 0605  . ibuprofen (ADVIL,MOTRIN) tablet 600 mg  600 mg Oral Q6H PRN Rachael Fee, MD    600 mg at 08/09/13 0753  . levothyroxine (SYNTHROID, LEVOTHROID) tablet 125 mcg  125 mcg Oral QAC breakfast Rachael Fee, MD   125 mcg at 08/09/13 0700  . lithium carbonate capsule 300 mg  300 mg Oral BID WC Nanine Means, NP   300 mg at 08/09/13 0750  . magnesium citrate solution 1 Bottle  1 Bottle Oral Once PRN Nanine Means, NP      . magnesium hydroxide (MILK OF MAGNESIA) suspension 30 mL  30 mL Oral Daily PRN Court Joy, PA-C      . multivitamin with minerals tablet 1 tablet  1 tablet Oral Daily Court Joy, PA-C   1 tablet at 08/09/13 0750  . nicotine (NICODERM CQ - dosed in mg/24 hours) patch 14 mg  14 mg Transdermal Q0600 Rachael Fee, MD   14 mg at 08/09/13 0600  . pantoprazole (PROTONIX) EC tablet 40 mg  40 mg Oral BID Rachael Fee, MD   40 mg at 08/09/13 0750  . pseudoephedrine (SUDAFED) 12 hr tablet 120 mg  120 mg Oral Q12H PRN Rachael Fee, MD   120 mg at 08/09/13 0606  . thiamine (B-1) injection 100 mg  100 mg Intramuscular Once Court Joy, PA-C      . thiamine (VITAMIN B-1) tablet 100 mg  100 mg Oral Daily Court Joy, PA-C   100 mg at 08/09/13 0750  . traZODone (DESYREL) tablet 50 mg  50 mg Oral QHS Nanine Means, NP        Lab Results: No results found for this or any previous visit (from the past 48 hour(s)).  Physical Findings: AIMS: Facial and Oral Movements Muscles of Facial Expression: None, normal Lips and Perioral Area: None, normal Jaw: None, normal Tongue: None, normal,Extremity Movements Upper (arms, wrists, hands, fingers): None, normal Lower (legs, knees, ankles, toes): None, normal, Trunk Movements Neck, shoulders, hips: None, normal, Overall Severity Severity of abnormal movements (highest score from questions above): None, normal Incapacitation due to abnormal movements: None, normal Patient's awareness of abnormal movements (rate only patient's report): No Awareness, Dental Status Current problems with teeth and/or dentures?: No Does  patient usually wear dentures?: No  CIWA:  CIWA-Ar Total: 5 COWS:  COWS Total Score: 0  Treatment Plan Summary: Daily contact with patient to assess and evaluate symptoms and progress in treatment Medication management  Plan:  Review of chart, vital signs, medications, and notes. 1-Individual and group therapy 2-Medication management for depression and anxiety:  Medications reviewed with the patient and Trazodone added for sleep issues, dulcolax and magnesium citrate ordered PRN for her choice for her constipation 3-Coping skills for depression, anxiety, and substance abuse 4-Continue crisis stabilization and management 5-Address health issues--monitoring vital signs, stable 6-Treatment plan in progress to prevent relapse of depression, substance abuse, and anxiety  Medical  Decision Making Problem Points:  Established problem, stable/improving (1) and Review of psycho-social stressors (1) Data Points:  Review of new medications or change in dosage (2)  I certify that inpatient services furnished can reasonably be expected to improve the patient's condition.   Nanine Means, PMH-NP 08/09/2013, 3:04 PM I agreed with the findings, treatment and disposition plan of this patient. Kathryne Sharper, MD

## 2013-08-09 NOTE — Progress Notes (Signed)
Patient ID: [name], female   DOB: Sep 03, 1966, 46 y.o.   MRN: [id number] Psychoeducational Group Note  Date:  08/09/2013 Time:  0915am  Group Topic/Focus:  Making Healthy Choices:   The focus of this group is to help patients identify negative/unhealthy choices they were using prior to admission and identify positive/healthier coping strategies to replace them upon discharge.  Participation Level:  Active  Participation Quality:  Appropriate  Affect:  Depressed  Cognitive:  Appropriate  Insight:  Lacking  Engagement in Group:  Supportive  Additional Comments:  Inventory and Psychoeducational group   Valente David 08/09/2013,10:06 AM

## 2013-08-10 MED ORDER — BUSPIRONE HCL 15 MG PO TABS: 15.0000 mg | ORAL_TABLET | Freq: Three times a day (TID) | ORAL | Status: DC

## 2013-08-10 MED ORDER — BUSPIRONE HCL 15 MG PO TABS: 15.0000 mg | ORAL_TABLET | Freq: Two times a day (BID) | ORAL | Status: DC

## 2013-08-10 MED ORDER — LITHIUM CARBONATE 300 MG PO CAPS: 300.0000 mg | ORAL_CAPSULE | Freq: Two times a day (BID) | ORAL | Status: DC

## 2013-08-10 MED ORDER — AMOXICILLIN-POT CLAVULANATE 875-125 MG PO TABS: 1.0000 | ORAL_TABLET | Freq: Two times a day (BID) | ORAL | Status: DC

## 2013-08-10 MED ORDER — GABAPENTIN 400 MG PO CAPS: 800.0000 mg | ORAL_CAPSULE | Freq: Three times a day (TID) | ORAL | Status: DC

## 2013-08-10 MED ORDER — BUSPIRONE HCL 15 MG PO TABS
15.0000 mg | ORAL_TABLET | Freq: Two times a day (BID) | ORAL | Status: DC
  Filled 2013-08-10: qty 28
  Filled 2013-08-10: qty 1
  Filled 2013-08-10: qty 28

## 2013-08-10 MED ORDER — TRAZODONE HCL 50 MG PO TABS: 50.0000 mg | ORAL_TABLET | Freq: Every day | ORAL | Status: DC

## 2013-08-10 MED ORDER — HYDROXYZINE HCL 50 MG PO TABS: 50.0000 mg | ORAL_TABLET | Freq: Every evening | ORAL | Status: DC | PRN

## 2013-08-10 MED ORDER — DSS 100 MG PO CAPS: 100.0000 mg | ORAL_CAPSULE | Freq: Two times a day (BID) | ORAL | Status: DC

## 2013-08-10 MED ORDER — BUPROPION HCL ER (XL) 300 MG PO TB24: 300.0000 mg | ORAL_TABLET | Freq: Every day | ORAL | Status: DC

## 2013-08-10 MED ORDER — GABAPENTIN 800 MG PO TABS
800.0000 mg | ORAL_TABLET | Freq: Three times a day (TID) | ORAL | Status: DC
  Filled 2013-08-10 (×3): qty 42

## 2013-08-10 MED ORDER — BISACODYL 10 MG RE SUPP: 10.0000 mg | Freq: Every day | RECTAL | Status: DC | PRN

## 2013-08-10 MED ORDER — PANTOPRAZOLE SODIUM 40 MG PO TBEC: 40.0000 mg | DELAYED_RELEASE_TABLET | Freq: Two times a day (BID) | ORAL | Status: DC

## 2013-08-10 MED ORDER — LEVOTHYROXINE SODIUM 125 MCG PO TABS: 125.0000 ug | ORAL_TABLET | Freq: Every day | ORAL | Status: DC

## 2013-08-10 NOTE — Progress Notes (Signed)
D.  Pt pleasant and bright on approach.  Denies complaints at this time.  States she did not sleep last night and asked if doctor ordered her Trazodone for tonight.  Positive for evening AA group, interacting appropriately within milieu.  Denies SI/HI/hallucinations at this time.  A.  Support and encouragement offered.  Let Pt know that doctor had ordered Trazodone for her tonight.  R.  Pt pleased with new order, remains safe on unit.  Will continue to monitor.

## 2013-08-10 NOTE — Progress Notes (Signed)
Patient ID: Jacqueline [name], female   DOB: 26-May-1966, 47 y.o.   MRN: [id number]  D: Patient presents with depressed mood and affect. Patient is pleasant and cooperative with Clinical research associate. Patient states that she has SI "off and on," but does contract for safety. Patient denies HI and A/V hallucinations. Patient complains of constipation. Patient also complains of backache rating it a 6/10. Patient rated her depression 6/10 and hopelessness at a 2/10. 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   A: Emotional support and encouragement given to patient. Patient given PRN pain medication. Patient has order for PRN suppository for patient's complaint of constipation.   R: Patient is receptive and cooperative. Patient reports relief from pain. Q15 minute safety checks are maintained and patient is safe at this time.

## 2013-08-10 NOTE — [facility] Group Notes (Signed)
[facility] LCSW Aftercare Discharge Planning Group Note   08/10/2013 9:46 AM  Participation Quality:  Appropriate   Mood/Affect:  Appropriate  Depression Rating:  5  Anxiety Rating:  5  Thoughts of Suicide:  No Will you contract for safety?   NA  Current AVH:  No  Plan for Discharge/Comments:  Pt is scheduled for d/c tomorrow morning. She has screening at [facility] and plans to follow-up at [facility] for med management. PT requesting nicoderm patches (samples at d/c). Pt reports no signs of withdrawal.   Transportation Means: bus   Supports: none identified by pt.   Smart, [employer]
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
## 2013-08-10 NOTE — [facility] Group Notes (Signed)
[facility] LCSW Group Therapy  08/10/2013 3:44 PM  Type of Therapy:  Group Therapy  Participation Level:  Did Not Attend  Pt was with MD during group session and did not return to group.   Smart, Baley Shands 08/10/2013, 3:44 PM

## 2013-08-10 NOTE — Progress Notes (Signed)
[facility] Adult Case Management Discharge Plan :  Will you be returning to the same living situation after discharge: No.Pt has screening at [facility] and possible admission tomorrow.  At discharge, do you have transportation home?:Yes,  bus pass in chart.  Do you have the ability to pay for your medications:Yes,  mental health  Release of information consent forms completed and in the chart;  Patient's signature needed at discharge.  Patient to Follow up at: Follow-up Information   Follow up with [facility]. (Walk in between 8am-9am Monday through Friday for hospital followup/medication management. )    Contact information:   [address], [place] [address] Phone: [phone] Fax: [phone]      Follow up with [facility] On 08/11/2013. (Arrive by 8am for screening. Be sure to bring ID, medication supply, and clothing. If you pass screening and there is bed availability, you will be accepted for treatment on this day. )    Contact information:   [address] [place], [place] [address] Phone: [phone] Fax: [phone]      Patient denies SI/HI:   Yes,  during group/self report.    Safety Planning and Suicide Prevention discussed:  Yes,  SPE completed with pt. She was given SPI pamphlet and encouraged to share information with support network, ask questions and talk about concerns. Pt refused to consent to family contact.   Smart, Timera Windt 08/10/2013, 12:18 PM

## 2013-08-10 NOTE — Progress Notes (Signed)
D   Pt is pleasant and appropriate    She reports feeling ready for discharge and looking forward to further treatment   She understands al;l of the discharge instructions and knows she will be waking up at 5am to catch the city bus at 639am A   Verbal support given  Medications administered and effectiveness monitored   Q 15 min checks R   Pt safe at present

## 2013-08-10 NOTE — Discharge Summary (Signed)
Physician Discharge Summary Note  Patient:  Jacqueline [name] is an 47 y.o., female MRN:  [id number] DOB:  December 30, 1965 Patient phone:  [phone] (home)  Patient address:   [address] [place] [address],   Date of Admission:  08/03/2013 Date of Discharge: 08/11/2013  Reason for Admission:  Alcohol detox/withdrawal  Discharge Diagnoses: Active Problems:   Polysubstance abuse   Depression   Anxiety disorder   Cocaine abuse, episodic   PTSD (post-traumatic stress disorder)  Review of Systems  Constitutional: Negative.   HENT: Negative.   Eyes: Negative.   Respiratory: Negative.   Cardiovascular: Negative.   Gastrointestinal: Negative.   Genitourinary: Negative.   Musculoskeletal: Negative.   Skin: Negative.   Neurological: Negative.   Endo/Heme/Allergies: Negative.   Psychiatric/Behavioral: Positive for substance abuse. The patient is nervous/anxious.     DSM5:  Substance/Addictive Disorders:  Alcohol Related Disorder - Severe (303.90) Depressive Disorders:  Major Depressive Disorder - Severe (296.23)  Axis Diagnosis:   AXIS I:  Alcohol Abuse, Major Depression, Recurrent severe, Substance Abuse and Substance Induced Mood Disorder AXIS II:  Deferred AXIS III:   Past Medical History  Diagnosis Date  . Peptic ulcer   . Alcohol abuse   . Anemia   . Benzodiazepine abuse   . Thyroid disease   . Alcoholism   . Narcotic abuse   . Back pain   . Thrombocytopenia 06/17/2011  . Hypothyroidism   . Seizures   . Medical history non-contributory     hypoglycemic   AXIS IV:  economic problems, housing problems, other psychosocial or environmental problems, problems related to social environment and problems with primary support group AXIS V:  61-70 mild symptoms  Level of Care:  [facility]  Hospital Course:  On admission:  47 Y/O female who states that after she left last time she went to live at a half way house. States she started a job. states that she started  having panic attacks. The anxiety attacks triggered her drinking For the last week she has been drinking Listerine one big bottle a day. She was asked to leave the half way house. She is hopeless feels helpless. States her anxiety is out of cotrol  During hospitalization:  Librium protocol implemented successfully.  Her Prozac 40 mg for her home medication was not continued but her synthroid for hypothyroid issues.  Wellbutrin 300 mg for depression daily, Buspar 15 mg TID for anxiety, Gabapentin 800 mg TID for neuropathic pain, vistaril 50 mg and Trazodone 50 mg for sleep issues, Lithium 300 mg BID for mood disorder, dulcolax PRN daily constipation, colace 100 mg BID to prevent constipation, Augmentin for ear infection started.  Patient attended and participated in therapy.  Nekita denied suicidal/homicidal ideations and auditory/visual hallucinations, follow-up appointments encouraged to attend, outside support groups encouraged and information given, Rx and two week supply of medications given.  Preslei is mentally and physically stable for discharge.  Consults:  None  Significant Diagnostic Studies:  labs: completed, reviewed, stable  Discharge Vitals:   Blood pressure 97/65, pulse 108, temperature 99.1 F (37.3 C), temperature source Oral, resp. rate 16, height 5\' 7"  (1.702 m), weight 84.369 kg (186 lb), last menstrual period 07/18/2013. Body mass index is 29.12 kg/(m^2). Lab Results:   No results found for this or any previous visit (from the past 72 hour(s)).  Physical Findings: AIMS: Facial and Oral Movements Muscles of Facial Expression: None, normal Lips and Perioral Area: None, normal Jaw: None, normal Tongue: None, normal,Extremity Movements  Upper (arms, wrists, hands, fingers): None, normal Lower (legs, knees, ankles, toes): None, normal, Trunk Movements Neck, shoulders, hips: None, normal, Overall Severity Severity of abnormal movements (highest score from questions above):  None, normal Incapacitation due to abnormal movements: None, normal Patient's awareness of abnormal movements (rate only patient's report): No Awareness, Dental Status Current problems with teeth and/or dentures?: No Does patient usually wear dentures?: No  CIWA:  CIWA-Ar Total: 5 COWS:  COWS Total Score: 0  Psychiatric Specialty Exam: See Psychiatric Specialty Exam and Suicide Risk Assessment completed by Attending Physician prior to discharge.  Discharge destination:  [facility]  Is patient on multiple antipsychotic therapies at discharge:  No   Has Patient had three or more failed trials of antipsychotic monotherapy by history:  No  Recommended Plan for Multiple Antipsychotic Therapies: NA  Discharge Orders   Future Orders Complete By Expires   Activity as tolerated - No restrictions  As directed    Diet - low sodium heart healthy  As directed    Diet - low sodium heart healthy  As directed    Increase activity slowly  As directed        Medication List    STOP taking these medications       FLUoxetine 40 MG capsule  Commonly known as:  PROZAC      TAKE these medications     Indication   amoxicillin-clavulanate 875-125 MG per tablet  Commonly known as:  AUGMENTIN  Take 1 tablet by mouth every 12 (twelve) hours.   Indication:  Middle Ear Infection     bisacodyl 10 MG suppository  Commonly known as:  DULCOLAX  Place 1 suppository (10 mg total) rectally daily as needed for constipation.   Indication:  Constipation     buPROPion 300 MG 24 hr tablet  Commonly known as:  WELLBUTRIN XL  Take 1 tablet (300 mg total) by mouth daily.   Indication:  Major Depressive Disorder     busPIRone 15 MG tablet  Commonly known as:  BUSPAR  Take 1 tablet (15 mg total) by mouth 3 (three) times daily.   Indication:  Anxiety Disorder, Depression     DSS 100 MG Caps  Take 100 mg by mouth 2 (two) times daily.   Indication:  Constipation     gabapentin 400 MG capsule   Commonly known as:  NEURONTIN  Take 2 capsules (800 mg total) by mouth 3 (three) times daily.   Indication:  Alcohol Withdrawal Syndrome, Neuropathic Pain     hydrOXYzine 50 MG tablet  Commonly known as:  ATARAX/VISTARIL  Take 1 tablet (50 mg total) by mouth at bedtime as needed and may repeat dose one time if needed (insomnia).      levothyroxine 125 MCG tablet  Commonly known as:  SYNTHROID, LEVOTHROID  Take 1 tablet (125 mcg total) by mouth daily before breakfast. For thyroid hormone replacement   Indication:  Underactive Thyroid     lithium carbonate 300 MG capsule  Take 1 capsule (300 mg total) by mouth 2 (two) times daily with a meal.   Indication:  Manic-Depression     pantoprazole 40 MG tablet  Commonly known as:  PROTONIX  Take 1 tablet (40 mg total) by mouth 2 (two) times daily.   Indication:  Gastroesophageal Reflux Disease     traZODone 50 MG tablet  Commonly known as:  DESYREL  Take 1 tablet (50 mg total) by mouth at bedtime.   Indication:  Trouble Sleeping  Follow-up Information   Follow up with [facility]. (Walk in between 8am-9am Monday through Friday for hospital followup/medication management. )    Contact information:   [address], [place] [address] Phone: [phone] Fax: [phone]      Follow up with [facility] On 08/11/2013. (Arrive by 8am for screening. Be sure to bring ID, medication supply, and clothing. If you pass screening and there is bed availability, you will be accepted for treatment on this day. )    Contact information:   [address] [place], [place] [address] Phone: [phone] Fax: [phone]      Follow-up recommendations:  Activity:  as tolerated Diet:  low-sodium heart healthy diet  Comments:  Patient will continue her care at [facility] in the am. Continue to work your relapse prevention plan. Continue to work on the life style changes that could help you better manage your anxiety, and mood  disorder Total Discharge Time:  Greater than 30 minutes.  SignedNanine Means, PMH-NP  08/10/2013, 11:15 AM

## 2013-08-10 NOTE — Progress Notes (Signed)
Adult Psychoeducational Group Note  Date:  08/10/2013 Time:  4:10 PM  Group Topic/Focus:  Self Care:   The focus of this group is to help patients understand the importance of self-care in order to improve or restore emotional, physical, spiritual, interpersonal, and financial health.  Participation Level:  Active  Participation Quality:  Appropriate, Sharing and Supportive  Affect:  Appropriate  Cognitive:  Appropriate  Insight: Appropriate and Good  Engagement in Group:  Engaged and Supportive  Modes of Intervention:  Activity, Socialization and Support  Additional Comments:  Pt stated she needed to stop drinking to improve her self care and get her depression under control in order to do that.  Jacqueline [name] 08/10/2013, 4:10 PM

## 2013-08-10 NOTE — Progress Notes (Signed)
[facility] MD Progress Note  08/10/2013 3:57 PM Jacqueline [name]  MRN:  [id number] Subjective:  Still endorsing pain in her back. states she cant take too many Ibuprofen as her stomach had bleed before. States that the Buspar is helping, but that she thinks it is increasing her appetite. She does not want to gain the weight Diagnosis:   DSM5: Schizophrenia Disorders:  Denies Obsessive-Compulsive Disorders:  Denies Trauma-Stressor Disorders:  Denies PTSD Substance/Addictive Disorders:  Alcohol Related Disorder - Severe (303.90), Cocaine Dependence Depressive Disorders:  Major Depressive Disorder - Moderate (296.22)  Axis I: Anxiety Disorder NOS  ADL's:  Intact  Sleep: Fair  Appetite:  increased  Suicidal Ideation:  Plan:  denies Intent:  denies Means:  denies Homicidal Ideation:  Plan:  denies Intent:  denies Means:  denies AEB (as evidenced by):  Psychiatric Specialty Exam: Review of Systems  Constitutional: Negative.   HENT: Negative.   Eyes: Negative.   Respiratory: Negative.   Cardiovascular: Negative.   Gastrointestinal: Negative.   Genitourinary: Negative.   Musculoskeletal: Positive for back pain.  Skin: Negative.   Neurological: Negative.   Endo/Heme/Allergies: Negative.   Psychiatric/Behavioral: Positive for depression and substance abuse. The patient is nervous/anxious.     Blood pressure 97/65, pulse 108, temperature 99.1 F (37.3 C), temperature source Oral, resp. rate 16, height 5\' 7"  (1.702 m), weight 84.369 kg (186 lb), last menstrual period 07/18/2013.Body mass index is 29.12 kg/(m^2).  General Appearance: Fairly Groomed  Patent attorney::  Fair  Speech:  Clear and Coherent  Volume:  fluctuates  Mood:  Anxious and worried  Affect:  anxious, worried  Thought Process:  Coherent and Goal Directed  Orientation:  Full (Time, Place, and Person)  Thought Content:  symtpoms, worries, concerns  Suicidal Thoughts:  No  Homicidal Thoughts:  No  Memory:  Immediate;    Fair Recent;   Fair Remote;   Fair  Judgement:  Fair  Insight:  Present and superficial  Psychomotor Activity:  Restlessness  Concentration:  Fair  Recall:  Fair  Akathisia:  No  Handed:    AIMS (if indicated):     Assets:  Desire for Improvement  Sleep:  Number of Hours: 6.75   Current Medications: Current Facility-Administered Medications  Medication Dose Route Frequency Provider Last Rate Last Dose  . acetaminophen (TYLENOL) tablet 650 mg  650 mg Oral Q6H PRN Court Joy, PA-C   650 mg at 08/08/13 1118  . alum & mag hydroxide-simeth (MAALOX/MYLANTA) 200-200-20 MG/5ML suspension 30 mL  30 mL Oral Q4H PRN Court Joy, PA-C      . amoxicillin-clavulanate (AUGMENTIN) 875-125 MG per tablet 1 tablet  1 tablet Oral Q12H Nanine Means, NP   1 tablet at 08/10/13 [phone]  . bisacodyl (DULCOLAX) suppository 10 mg  10 mg Rectal Daily PRN Nanine Means, NP   10 mg at 08/10/13 1406  . buPROPion (WELLBUTRIN XL) 24 hr tablet 300 mg  300 mg Oral Daily Rachael Fee, MD   300 mg at 08/10/13 [phone]  . [START ON 08/11/2013] busPIRone (BUSPAR) tablet 15 mg  15 mg Oral BID Rachael Fee, MD      . docusate sodium (COLACE) capsule 100 mg  100 mg Oral BID Kerry Hough, PA-C   100 mg at 08/10/13 3086  . feeding supplement (ENSURE COMPLETE) (ENSURE COMPLETE) liquid 237 mL  237 mL Oral QHS Lavena Bullion, RD   237 mL at 08/07/13 2006  . gabapentin (NEURONTIN) capsule 800 mg  800 mg Oral TID Rachael Fee, MD   800 mg at 08/10/13 1157  . hydrOXYzine (ATARAX/VISTARIL) tablet 50 mg  50 mg Oral QHS PRN,MR X 1 Nanine Means, NP   50 mg at 08/10/13 0618  . ibuprofen (ADVIL,MOTRIN) tablet 600 mg  600 mg Oral Q6H PRN Rachael Fee, MD   600 mg at 08/10/13 [phone]  . levothyroxine (SYNTHROID, LEVOTHROID) tablet 125 mcg  125 mcg Oral QAC breakfast Rachael Fee, MD   125 mcg at 08/10/13 0700  . lithium carbonate capsule 300 mg  300 mg Oral BID WC Nanine Means, NP   300 mg at 08/10/13 1191  . magnesium hydroxide (MILK  OF MAGNESIA) suspension 30 mL  30 mL Oral Daily PRN Court Joy, PA-C      . multivitamin with minerals tablet 1 tablet  1 tablet Oral Daily Court Joy, PA-C   1 tablet at 08/09/13 0750  . nicotine (NICODERM CQ - dosed in mg/24 hours) patch 14 mg  14 mg Transdermal Q0600 Rachael Fee, MD   14 mg at 08/10/13 0600  . pantoprazole (PROTONIX) EC tablet 40 mg  40 mg Oral BID Rachael Fee, MD   40 mg at 08/10/13 [phone]  . pseudoephedrine (SUDAFED) 12 hr tablet 120 mg  120 mg Oral Q12H PRN Rachael Fee, MD   120 mg at 08/10/13 0618  . thiamine (B-1) injection 100 mg  100 mg Intramuscular Once Court Joy, PA-C      . thiamine (VITAMIN B-1) tablet 100 mg  100 mg Oral Daily Court Joy, PA-C   100 mg at 08/10/13 9562  . traZODone (DESYREL) tablet 50 mg  50 mg Oral QHS Nanine Means, NP   50 mg at 08/09/13 2130    Lab Results: No results found for this or any previous visit (from the past 48 hour(s)).  Physical Findings: AIMS: Facial and Oral Movements Muscles of Facial Expression: None, normal Lips and Perioral Area: None, normal Jaw: None, normal Tongue: None, normal,Extremity Movements Upper (arms, wrists, hands, fingers): None, normal Lower (legs, knees, ankles, toes): None, normal, Trunk Movements Neck, shoulders, hips: None, normal, Overall Severity Severity of abnormal movements (highest score from questions above): None, normal Incapacitation due to abnormal movements: None, normal Patient's awareness of abnormal movements (rate only patient's report): No Awareness, Dental Status Current problems with teeth and/or dentures?: No Does patient usually wear dentures?: No  CIWA:  CIWA-Ar Total: 5 COWS:  COWS Total Score: 0  Treatment Plan Summary: Daily contact with patient to assess and evaluate symptoms and progress in treatment Medication management  Plan: Supportive approach/coping skills/relapse prevention           Will decrease the Buspar to 15 BID           Will  explore alternatives to optimize pain management           Will be evaluated at [facility] in the morning Medical Decision Making Problem Points:  Review of psycho-social stressors (1) Data Points:  Review of medication regiment & side effects (2) Review of new medications or change in dosage (2)  I certify that inpatient services furnished can reasonably be expected to improve the patient's condition.   Jacqueline [name] A 08/10/2013, 3:57 PM

## 2013-08-11 NOTE — Progress Notes (Signed)
Pt was discharged  She left with all belongings, medication supply for 2 weeks and follow up information   Pt turned down a hot breakfast but was given some snacks and a cup of coffee to take to the bus stop   She verbalized understanding  Of follow up information and instructions to get to her destination which was [facility] for further treatment  Pt denies suicidal and homicidal ideation  She expressed gratitude for all that was done for her

## 2013-08-14 NOTE — Progress Notes (Signed)
Patient Discharge Instructions:  After Visit Summary (AVS):   Faxed to:  08/14/13 Discharge Summary Note:   Faxed to:  08/14/13 Psychiatric Admission Assessment Note:   Faxed to:  08/14/13 Suicide Risk Assessment - Discharge Assessment:   Faxed to:  08/14/13 Faxed/Sent to the Next Level Care provider:  08/14/13 Faxed to [facility] @ [phone] Faxed to [facility] @ [phone]  Jerelene Redden, 08/14/2013, 1:12 PM

## 2013-12-17 ENCOUNTER — Encounter (HOSPITAL_COMMUNITY): Payer: Self-pay | Admitting: Emergency Medicine

## 2013-12-17 ENCOUNTER — Emergency Department (HOSPITAL_COMMUNITY)
Admission: EM | Admit: 2013-12-17 | Discharge: 2013-12-17 | Discharge status: Against Medical Advice | Payer: Self-pay | Attending: Emergency Medicine | Admitting: Emergency Medicine

## 2013-12-17 DIAGNOSIS — F10229 Alcohol dependence with intoxication, unspecified: Secondary | ICD-10-CM | POA: Insufficient documentation

## 2013-12-17 DIAGNOSIS — Y929 Unspecified place or not applicable: Secondary | ICD-10-CM | POA: Insufficient documentation

## 2013-12-17 DIAGNOSIS — S0993XA Unspecified injury of face, initial encounter: Secondary | ICD-10-CM | POA: Insufficient documentation

## 2013-12-17 DIAGNOSIS — Y939 Activity, unspecified: Secondary | ICD-10-CM | POA: Insufficient documentation

## 2013-12-17 DIAGNOSIS — S199XXA Unspecified injury of neck, initial encounter: Secondary | ICD-10-CM

## 2013-12-17 DIAGNOSIS — W19XXXA Unspecified fall, initial encounter: Secondary | ICD-10-CM | POA: Insufficient documentation

## 2013-12-17 DIAGNOSIS — F17200 Nicotine dependence, unspecified, uncomplicated: Secondary | ICD-10-CM | POA: Insufficient documentation

## 2013-12-17 DIAGNOSIS — F1310 Sedative, hypnotic or anxiolytic abuse, uncomplicated: Secondary | ICD-10-CM | POA: Insufficient documentation

## 2013-12-17 DIAGNOSIS — F411 Generalized anxiety disorder: Secondary | ICD-10-CM | POA: Insufficient documentation

## 2013-12-17 NOTE — ED Notes (Signed)
Pt reports she wants to detox from alcohol, states last drink x1 hour ago, had several bottles of wine today. States she also wants to detox from benzos. Pt states she fell x2 weeks ago and hurt her neck and has continued pain when turning her head and "doesn't want pain medication. I just want to know what is wrong with my C3-C4." Pt a&o x4, anxious in triage.  Pt denies SI/HI or AVH.

## 2013-12-17 NOTE — ED Notes (Signed)
Pt not in room at this time

## 2013-12-18 ENCOUNTER — Encounter (HOSPITAL_COMMUNITY): Payer: Self-pay | Admitting: Emergency Medicine

## 2013-12-18 ENCOUNTER — Emergency Department (HOSPITAL_COMMUNITY)
Admission: EM | Admit: 2013-12-18 | Discharge: 2013-12-18 | Disposition: A | Discharge status: Other Acute Inpatient Hospital | Payer: Self-pay | Attending: Emergency Medicine | Admitting: Emergency Medicine

## 2013-12-18 DIAGNOSIS — E039 Hypothyroidism, unspecified: Secondary | ICD-10-CM | POA: Insufficient documentation

## 2013-12-18 DIAGNOSIS — Z3202 Encounter for pregnancy test, result negative: Secondary | ICD-10-CM | POA: Insufficient documentation

## 2013-12-18 DIAGNOSIS — F1310 Sedative, hypnotic or anxiolytic abuse, uncomplicated: Secondary | ICD-10-CM | POA: Insufficient documentation

## 2013-12-18 DIAGNOSIS — Z8711 Personal history of peptic ulcer disease: Secondary | ICD-10-CM | POA: Insufficient documentation

## 2013-12-18 DIAGNOSIS — G40909 Epilepsy, unspecified, not intractable, without status epilepticus: Secondary | ICD-10-CM | POA: Insufficient documentation

## 2013-12-18 DIAGNOSIS — F1410 Cocaine abuse, uncomplicated: Secondary | ICD-10-CM | POA: Insufficient documentation

## 2013-12-18 DIAGNOSIS — Z9884 Bariatric surgery status: Secondary | ICD-10-CM | POA: Insufficient documentation

## 2013-12-18 DIAGNOSIS — Z79899 Other long term (current) drug therapy: Secondary | ICD-10-CM | POA: Insufficient documentation

## 2013-12-18 DIAGNOSIS — F410 Panic disorder [episodic paroxysmal anxiety] without agoraphobia: Secondary | ICD-10-CM | POA: Insufficient documentation

## 2013-12-18 DIAGNOSIS — F17200 Nicotine dependence, unspecified, uncomplicated: Secondary | ICD-10-CM | POA: Insufficient documentation

## 2013-12-18 DIAGNOSIS — F1020 Alcohol dependence, uncomplicated: Secondary | ICD-10-CM | POA: Insufficient documentation

## 2013-12-18 DIAGNOSIS — F4310 Post-traumatic stress disorder, unspecified: Secondary | ICD-10-CM | POA: Insufficient documentation

## 2013-12-18 DIAGNOSIS — Z862 Personal history of diseases of the blood and blood-forming organs and certain disorders involving the immune mechanism: Secondary | ICD-10-CM | POA: Insufficient documentation

## 2013-12-18 LAB — COMPREHENSIVE METABOLIC PANEL
ALBUMIN: 3.8 g/dL (ref 3.5–5.2)
ALT: 90 U/L — ABNORMAL HIGH (ref 0–35)
AST: 100 U/L — ABNORMAL HIGH (ref 0–37)
Alkaline Phosphatase: 77 U/L (ref 39–117)
BUN: 13 mg/dL (ref 6–23)
CALCIUM: 8.5 mg/dL (ref 8.4–10.5)
CO2: 22 meq/L (ref 19–32)
CREATININE: 1.29 mg/dL — AB (ref 0.50–1.10)
Chloride: 102 mEq/L (ref 96–112)
GFR calc Af Amer: 57 mL/min — ABNORMAL LOW (ref >90)
GFR calc non Af Amer: 49 mL/min — ABNORMAL LOW (ref >90)
Glucose, Bld: 76 mg/dL (ref 70–99)
Potassium: 4.3 mEq/L (ref 3.7–5.3)
Sodium: 140 mEq/L (ref 137–147)
TOTAL PROTEIN: 7.8 g/dL (ref 6.0–8.3)
Total Bilirubin: 0.2 mg/dL — ABNORMAL LOW (ref 0.3–1.2)

## 2013-12-18 LAB — CBC
HCT: 32.4 % — ABNORMAL LOW (ref 36.0–46.0)
Hemoglobin: 10.1 g/dL — ABNORMAL LOW (ref 12.0–15.0)
MCH: 26.5 pg (ref 26.0–34.0)
MCHC: 31.2 g/dL (ref 30.0–36.0)
MCV: 85 fL (ref 78.0–100.0)
PLATELETS: 433 10*3/uL — AB (ref 150–400)
RBC: 3.81 MIL/uL — AB (ref 3.87–5.11)
RDW: 19.4 % — AB (ref 11.5–15.5)
WBC: 7 10*3/uL (ref 4.0–10.5)

## 2013-12-18 LAB — ETHANOL: Alcohol, Ethyl (B): 174 mg/dL — ABNORMAL HIGH (ref 0–11)

## 2013-12-18 LAB — RAPID URINE DRUG SCREEN, HOSP PERFORMED
Amphetamines: NOT DETECTED
BENZODIAZEPINES: POSITIVE — AB
Barbiturates: NOT DETECTED
Cocaine: POSITIVE — AB
OPIATES: NOT DETECTED
Tetrahydrocannabinol: NOT DETECTED

## 2013-12-18 LAB — ACETAMINOPHEN LEVEL: Acetaminophen (Tylenol), Serum: <15 ug/mL (ref 10–30)

## 2013-12-18 LAB — SALICYLATE LEVEL

## 2013-12-18 LAB — PREGNANCY, URINE: Preg Test, Ur: NEGATIVE

## 2013-12-18 MED ORDER — LORAZEPAM 1 MG PO TABS
0.0000 mg | ORAL_TABLET | Freq: Two times a day (BID) | ORAL | Status: DC
Start: 2013-12-20 — End: 2013-12-19

## 2013-12-18 MED ORDER — VITAMIN B-1 100 MG PO TABS
100.0000 mg | ORAL_TABLET | Freq: Every day | ORAL | Status: DC
  Administered 2013-12-18: 19:00:00 100 mg via ORAL
  Filled 2013-12-18: qty 1

## 2013-12-18 MED ORDER — ARIPIPRAZOLE 10 MG PO TABS
10.0000 mg | ORAL_TABLET | Freq: Every day | ORAL | Status: DC
  Administered 2013-12-18: 10 mg via ORAL
  Filled 2013-12-18: qty 1

## 2013-12-18 MED ORDER — SODIUM CHLORIDE 0.9 % IV BOLUS (SEPSIS): 1000.0000 mL | Freq: Once | INTRAVENOUS | Status: DC

## 2013-12-18 MED ORDER — NICOTINE 14 MG/24HR TD PT24
14.0000 mg | MEDICATED_PATCH | Freq: Every day | TRANSDERMAL | Status: DC
  Administered 2013-12-18: 14 mg via TRANSDERMAL
  Filled 2013-12-18: qty 1

## 2013-12-18 MED ORDER — GABAPENTIN 400 MG PO CAPS
800.0000 mg | ORAL_CAPSULE | Freq: Three times a day (TID) | ORAL | Status: DC
  Administered 2013-12-18 (×2): 800 mg via ORAL
  Filled 2013-12-18 (×2): qty 2

## 2013-12-18 MED ORDER — THIAMINE HCL 100 MG/ML IJ SOLN: 100.0000 mg | Freq: Every day | INTRAMUSCULAR | Status: DC

## 2013-12-18 MED ORDER — LORAZEPAM 1 MG PO TABS
0.0000 mg | ORAL_TABLET | Freq: Four times a day (QID) | ORAL | Status: DC
  Administered 2013-12-18: 1 mg via ORAL
  Filled 2013-12-18: qty 1

## 2013-12-18 MED ORDER — PANTOPRAZOLE SODIUM 40 MG PO TBEC
40.0000 mg | DELAYED_RELEASE_TABLET | Freq: Every day | ORAL | Status: DC
  Administered 2013-12-18: 40 mg via ORAL
  Filled 2013-12-18: qty 1

## 2013-12-18 MED ORDER — LEVOTHYROXINE SODIUM 125 MCG PO TABS
125.0000 ug | ORAL_TABLET | Freq: Every day | ORAL | Status: DC
  Filled 2013-12-18: qty 1

## 2013-12-18 MED ORDER — ONDANSETRON 4 MG PO TBDP
8.0000 mg | ORAL_TABLET | Freq: Once | ORAL | Status: AC
  Administered 2013-12-18: 8 mg via ORAL
  Filled 2013-12-18: qty 2

## 2013-12-18 MED ORDER — BUPROPION HCL ER (XL) 300 MG PO TB24
300.0000 mg | ORAL_TABLET | Freq: Every day | ORAL | Status: DC
  Administered 2013-12-18: 300 mg via ORAL
  Filled 2013-12-18: qty 1

## 2013-12-18 MED ORDER — FLUOXETINE HCL 20 MG PO CAPS
40.0000 mg | ORAL_CAPSULE | Freq: Every day | ORAL | Status: DC
  Administered 2013-12-18: 40 mg via ORAL
  Filled 2013-12-18: qty 2

## 2013-12-18 NOTE — BH Assessment (Signed)
Pt meets exclusionary criteria for [facility] and [facility] due to being prescribed benzos.  Fredonia Highland, MSW, LCSW Triage Specialist [phone]

## 2013-12-18 NOTE — ED Provider Notes (Signed)
CSN: [id number]     Arrival date & time 12/18/13  1353 History   First MD Initiated Contact with Patient 12/18/13 1720     Chief Complaint  Patient presents with  . Medical Clearance  . Alcohol Intoxication     (Consider location/radiation/quality/duration/timing/severity/associated sxs/prior Treatment) HPI Comments: 48 year old female presents to the ED requesting detox from alcohol and benzos. She states normally she takes 4 or 51 mg Klonopin tablets a day. She ran out 2 days ago. She states part of this is from her taking it and part of it is from being "too generous" to friends. She states she's prescribed this for anxiety, panic attacks, and PTSD. She drinks alcohol daily. She went to detox last year and was sober for 5 months but relapsed a month or 2 ago. The patient last drank this morning. She is not a case of beer per day. She states she's felt a little tremulous but not any seizures. She is currently having a headache. At this time she is wanting voluntary detox. Denies SI/HI   Past Medical History  Diagnosis Date  . Peptic ulcer   . Alcohol abuse   . Anemia   . Benzodiazepine abuse   . Thyroid disease   . Alcoholism   . Narcotic abuse   . Back pain   . Thrombocytopenia 06/17/2011  . Hypothyroidism   . Seizures   . Medical history non-contributory     hypoglycemic   Past Surgical History  Procedure Laterality Date  . Cholecystectomy    . Abdominal surgery    . Esophagogastroduodenoscopy    . Gastric bypass    . Tubal ligation    . Esophagogastroduodenoscopy N/A 03/23/2013    Procedure: ESOPHAGOGASTRODUODENOSCOPY (EGD);  Surgeon: Ladene Artist, MD;  Location: [facility];  Service: Endoscopy;  Laterality: N/A;  . No past surgeries      gastric bypass   Family History  Problem Relation Age of Onset  . Alcohol abuse Father   . Alcoholism Father   . Cancer Other    History  Substance Use Topics  . Smoking status: Current Every Day Smoker -- 1.00 packs/day for  12 years    Types: Cigarettes    Last Attempt to Quit: 08/08/2011  . Smokeless tobacco: Never Used  . Alcohol Use: Yes     Comment: drink 4 liters a day. 1 case/day drinks Listerine   OB History   Grav Para Term Preterm Abortions TAB SAB Ect Mult Living   3 3  3      3      Review of Systems  Constitutional: Negative for fever.  Neurological: Positive for tremors and headaches. Negative for seizures.  Psychiatric/Behavioral: Negative for suicidal ideas and self-injury.  All other systems reviewed and are negative.      Allergies  Nsaids  Home Medications   Current Outpatient Rx  Name  Route  Sig  Dispense  Refill  . ARIPiprazole (ABILIFY) 10 MG tablet   Oral   Take 10 mg by mouth daily.         Marland Kitchen buPROPion (WELLBUTRIN XL) 300 MG 24 hr tablet   Oral   Take 1 tablet (300 mg total) by mouth daily.   30 tablet   0   . clonazePAM (KLONOPIN) 1 MG tablet   Oral   Take 1 mg by mouth 2 (two) times daily.         Marland Kitchen FLUoxetine (PROZAC) 40 MG capsule   Oral   Take  40 mg by mouth daily.         Marland Kitchen gabapentin (NEURONTIN) 400 MG capsule   Oral   Take 2 capsules (800 mg total) by mouth 3 (three) times daily.   180 capsule   0   . levothyroxine (SYNTHROID, LEVOTHROID) 125 MCG tablet   Oral   Take 1 tablet (125 mcg total) by mouth daily before breakfast. For thyroid hormone replacement   30 tablet   0   . omeprazole (PRILOSEC) 20 MG capsule   Oral   Take 20 mg by mouth daily.          BP 115/69  Pulse 90  Temp(Src) 98.3 F (36.8 C) (Oral)  Resp 18  Ht 5\' 9"  (1.753 m)  Wt 180 lb (81.647 kg)  BMI 26.57 kg/m2  SpO2 99%  LMP 11/19/2013 Physical Exam  Nursing note and vitals reviewed. Constitutional: She is oriented to person, place, and time. She appears well-developed and well-nourished.  HENT:  Head: Normocephalic and atraumatic.  Right Ear: External ear normal.  Left Ear: External ear normal.  Nose: Nose normal.  Eyes: Right eye exhibits no  discharge. Left eye exhibits no discharge.  Cardiovascular: Normal rate, regular rhythm and normal heart sounds.   Pulmonary/Chest: Effort normal and breath sounds normal.  Abdominal: Soft. There is no tenderness.  Neurological: She is alert and oriented to person, place, and time.  Skin: Skin is warm and dry.    ED Course  Procedures (including critical care time) Labs Review Labs Reviewed  CBC - Abnormal; Notable for the following:    RBC 3.81 (*)    Hemoglobin 10.1 (*)    HCT 32.4 (*)    RDW 19.4 (*)    Platelets 433 (*)    All other components within normal limits  COMPREHENSIVE METABOLIC PANEL - Abnormal; Notable for the following:    Creatinine, Ser 1.29 (*)    AST 100 (*)    ALT 90 (*)    Total Bilirubin 0.2 (*)    GFR calc non Af Amer 49 (*)    GFR calc Af Amer 57 (*)    All other components within normal limits  ETHANOL - Abnormal; Notable for the following:    Alcohol, Ethyl (B) 174 (*)    All other components within normal limits  SALICYLATE LEVEL - Abnormal; Notable for the following:    Salicylate Lvl <0.6 (*)    All other components within normal limits  URINE RAPID DRUG SCREEN (HOSP PERFORMED) - Abnormal; Notable for the following:    Cocaine POSITIVE (*)    Benzodiazepines POSITIVE (*)    All other components within normal limits  ACETAMINOPHEN LEVEL  PREGNANCY, URINE   Imaging Review No results found.   EKG Interpretation None      MDM   Final diagnoses:  Alcohol dependence  Cocaine abuse  Benzodiazepine abuse    Patient initially tachycardic in triage, on recheck in ED her HR is 70. Has mild increase in Cr but normal BUN. Could be some dehydration vs chronic disease. Will encourage oral fluids and consult Psych.    Ephraim Hamburger, MD 12/18/13 2351

## 2013-12-18 NOTE — ED Notes (Signed)
Called for room did not answer,

## 2013-12-18 NOTE — BH Assessment (Signed)
Clinician contacted Dr. [name] for report on pt prior to assessment. Per Dr. [name] is requesting detox from alcohol and benzos. Murlean Iba, RN to request set up tele-assessment. Assessment to be initiated.  Fredonia Highland, MSW, LCSW Triage Specialist [phone]

## 2013-12-18 NOTE — ED Notes (Signed)
Pt from home c/o ETOH abuse and benzo detox; pt denies SI/HI;  Pt sts drank 1 case of beer today; benzo x 2 days ago

## 2013-12-18 NOTE — BH Assessment (Signed)
Clinician consulted with Jacqueline Colonel, NP who states Pt meets criteria for inpatient detox pending urine pregnancy screening. Contacted Dr. [name] and provide feedback and requested screening.  Dr. [name] agreed. Will follow up will disposition after UDS is completed.   Fredonia Highland, MSW, LCSW Triage Specialist [phone]

## 2013-12-18 NOTE — BH Assessment (Signed)
Clinician contacted [facility] to confirm bed availability.  Clinician contacted Dr. [name] and Raford Pitcher, RN to provide bed assignment 301-2 to Dr. Sabra Heck.   Fredonia Highland, MSW, LCSW Triage Specialist [phone]

## 2013-12-18 NOTE — ED Notes (Addendum)
Pt has been sleeping.  TTS set up in pt room.
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
## 2013-12-18 NOTE — ED Notes (Signed)
Called for reassessment. Did not answer.

## 2013-12-19 ENCOUNTER — Encounter (HOSPITAL_COMMUNITY): Payer: Self-pay

## 2013-12-19 ENCOUNTER — Inpatient Hospital Stay (HOSPITAL_COMMUNITY)
Admission: AD | Admit: 2013-12-19 | Discharge: 2013-12-23 | DRG: 897 | Disposition: A | Discharge status: Home / Self Care | Payer: 59 | Source: Intra-hospital | Attending: Psychiatry | Admitting: Psychiatry

## 2013-12-19 DIAGNOSIS — K5900 Constipation, unspecified: Secondary | ICD-10-CM | POA: Diagnosis present

## 2013-12-19 DIAGNOSIS — F17200 Nicotine dependence, unspecified, uncomplicated: Secondary | ICD-10-CM | POA: Diagnosis present

## 2013-12-19 DIAGNOSIS — F1410 Cocaine abuse, uncomplicated: Secondary | ICD-10-CM

## 2013-12-19 DIAGNOSIS — K219 Gastro-esophageal reflux disease without esophagitis: Secondary | ICD-10-CM | POA: Diagnosis present

## 2013-12-19 DIAGNOSIS — F39 Unspecified mood [affective] disorder: Secondary | ICD-10-CM | POA: Diagnosis present

## 2013-12-19 DIAGNOSIS — E039 Hypothyroidism, unspecified: Secondary | ICD-10-CM | POA: Diagnosis present

## 2013-12-19 DIAGNOSIS — N390 Urinary tract infection, site not specified: Secondary | ICD-10-CM | POA: Diagnosis present

## 2013-12-19 DIAGNOSIS — F410 Panic disorder [episodic paroxysmal anxiety] without agoraphobia: Secondary | ICD-10-CM | POA: Diagnosis present

## 2013-12-19 DIAGNOSIS — F4310 Post-traumatic stress disorder, unspecified: Secondary | ICD-10-CM | POA: Diagnosis present

## 2013-12-19 DIAGNOSIS — F411 Generalized anxiety disorder: Secondary | ICD-10-CM | POA: Diagnosis present

## 2013-12-19 DIAGNOSIS — F1020 Alcohol dependence, uncomplicated: Secondary | ICD-10-CM | POA: Diagnosis present

## 2013-12-19 DIAGNOSIS — F10229 Alcohol dependence with intoxication, unspecified: Principal | ICD-10-CM | POA: Diagnosis present

## 2013-12-19 DIAGNOSIS — F1994 Other psychoactive substance use, unspecified with psychoactive substance-induced mood disorder: Secondary | ICD-10-CM | POA: Diagnosis present

## 2013-12-19 DIAGNOSIS — F10221 Alcohol dependence with intoxication delirium: Secondary | ICD-10-CM | POA: Diagnosis present

## 2013-12-19 LAB — URINE MICROSCOPIC-ADD ON

## 2013-12-19 LAB — URINALYSIS, ROUTINE W REFLEX MICROSCOPIC
Bilirubin Urine: NEGATIVE
Glucose, UA: NEGATIVE mg/dL
Hgb urine dipstick: NEGATIVE
Ketones, ur: NEGATIVE mg/dL
NITRITE: POSITIVE — AB
PH: 6 (ref 5.0–8.0)
Protein, ur: NEGATIVE mg/dL
SPECIFIC GRAVITY, URINE: 1.009 (ref 1.005–1.030)
UROBILINOGEN UA: 0.2 mg/dL (ref 0.0–1.0)

## 2013-12-19 MED ORDER — CHLORDIAZEPOXIDE HCL 25 MG PO CAPS
25.0000 mg | ORAL_CAPSULE | ORAL | Status: AC
  Administered 2013-12-21 – 2013-12-22 (×2): 25 mg via ORAL
  Filled 2013-12-19 (×2): qty 1

## 2013-12-19 MED ORDER — ADULT MULTIVITAMIN W/MINERALS CH
1.0000 | ORAL_TABLET | Freq: Every day | ORAL | Status: DC
  Administered 2013-12-19 – 2013-12-22 (×2): 1 via ORAL
  Filled 2013-12-19 (×6): qty 1

## 2013-12-19 MED ORDER — LOPERAMIDE HCL 2 MG PO CAPS: 2.0000 mg | ORAL_CAPSULE | ORAL | Status: AC | PRN

## 2013-12-19 MED ORDER — LEVOTHYROXINE SODIUM 125 MCG PO TABS
125.0000 ug | ORAL_TABLET | Freq: Every day | ORAL | Status: DC
  Administered 2013-12-19 – 2013-12-23 (×5): 125 ug via ORAL
  Filled 2013-12-19 (×7): qty 1

## 2013-12-19 MED ORDER — VITAMIN B-1 100 MG PO TABS
100.0000 mg | ORAL_TABLET | Freq: Every day | ORAL | Status: DC
  Administered 2013-12-20 – 2013-12-23 (×4): 100 mg via ORAL
  Filled 2013-12-19 (×5): qty 1

## 2013-12-19 MED ORDER — GABAPENTIN 400 MG PO CAPS
800.0000 mg | ORAL_CAPSULE | Freq: Three times a day (TID) | ORAL | Status: DC
  Administered 2013-12-19 – 2013-12-23 (×14): 800 mg via ORAL
  Filled 2013-12-19 (×16): qty 2

## 2013-12-19 MED ORDER — ONDANSETRON 4 MG PO TBDP
4.0000 mg | ORAL_TABLET | Freq: Four times a day (QID) | ORAL | Status: AC | PRN
Start: 2013-12-19 — End: 2013-12-22
  Administered 2013-12-19 – 2013-12-21 (×3): 4 mg via ORAL
  Filled 2013-12-19 (×3): qty 1

## 2013-12-19 MED ORDER — NICOTINE 14 MG/24HR TD PT24
14.0000 mg | MEDICATED_PATCH | Freq: Every day | TRANSDERMAL | Status: DC
  Administered 2013-12-19 – 2013-12-23 (×5): 14 mg via TRANSDERMAL
  Filled 2013-12-19 (×9): qty 1

## 2013-12-19 MED ORDER — ACETAMINOPHEN 325 MG PO TABS
650.0000 mg | ORAL_TABLET | Freq: Four times a day (QID) | ORAL | Status: DC | PRN
  Administered 2013-12-19 – 2013-12-23 (×5): 650 mg via ORAL
  Filled 2013-12-19 (×5): qty 2

## 2013-12-19 MED ORDER — THIAMINE HCL 100 MG/ML IJ SOLN: 100.0000 mg | Freq: Once | INTRAMUSCULAR | Status: DC

## 2013-12-19 MED ORDER — BUPROPION HCL ER (XL) 300 MG PO TB24
300.0000 mg | ORAL_TABLET | Freq: Every day | ORAL | Status: DC
  Administered 2013-12-19 – 2013-12-23 (×5): 300 mg via ORAL
  Filled 2013-12-19 (×6): qty 1

## 2013-12-19 MED ORDER — ARIPIPRAZOLE 10 MG PO TABS
10.0000 mg | ORAL_TABLET | Freq: Every day | ORAL | Status: DC
  Administered 2013-12-19 – 2013-12-21 (×3): 10 mg via ORAL
  Filled 2013-12-19 (×5): qty 1

## 2013-12-19 MED ORDER — CLONAZEPAM 1 MG PO TABS: 1.0000 mg | ORAL_TABLET | Freq: Two times a day (BID) | ORAL | Status: DC

## 2013-12-19 MED ORDER — FLUOXETINE HCL 20 MG PO CAPS
40.0000 mg | ORAL_CAPSULE | Freq: Every day | ORAL | Status: DC
  Administered 2013-12-19 – 2013-12-23 (×5): 40 mg via ORAL
  Filled 2013-12-19 (×6): qty 2

## 2013-12-19 MED ORDER — CHLORDIAZEPOXIDE HCL 25 MG PO CAPS
25.0000 mg | ORAL_CAPSULE | Freq: Three times a day (TID) | ORAL | Status: AC
Start: 2013-12-20 — End: 2013-12-21
  Administered 2013-12-20 – 2013-12-21 (×3): 25 mg via ORAL
  Filled 2013-12-19 (×3): qty 1

## 2013-12-19 MED ORDER — CHLORDIAZEPOXIDE HCL 25 MG PO CAPS
25.0000 mg | ORAL_CAPSULE | Freq: Four times a day (QID) | ORAL | Status: AC | PRN
Start: 2013-12-19 — End: 2013-12-22
  Filled 2013-12-19: qty 1

## 2013-12-19 MED ORDER — CHLORDIAZEPOXIDE HCL 25 MG PO CAPS
25.0000 mg | ORAL_CAPSULE | Freq: Every day | ORAL | Status: AC
  Administered 2013-12-23: 25 mg via ORAL
  Filled 2013-12-19: qty 1

## 2013-12-19 MED ORDER — CHLORDIAZEPOXIDE HCL 25 MG PO CAPS
50.0000 mg | ORAL_CAPSULE | Freq: Once | ORAL | Status: AC
Start: 2013-12-19 — End: 2013-12-19
  Administered 2013-12-19: 50 mg via ORAL
  Filled 2013-12-19: qty 2

## 2013-12-19 MED ORDER — HYDROXYZINE HCL 25 MG PO TABS
25.0000 mg | ORAL_TABLET | Freq: Four times a day (QID) | ORAL | Status: AC | PRN
  Administered 2013-12-19 – 2013-12-20 (×3): 25 mg via ORAL
  Filled 2013-12-19 (×3): qty 1

## 2013-12-19 MED ORDER — CHLORDIAZEPOXIDE HCL 25 MG PO CAPS
25.0000 mg | ORAL_CAPSULE | Freq: Four times a day (QID) | ORAL | Status: AC
  Administered 2013-12-19 – 2013-12-20 (×6): 25 mg via ORAL
  Filled 2013-12-19 (×5): qty 1

## 2013-12-19 MED ORDER — TRAZODONE HCL 50 MG PO TABS
50.0000 mg | ORAL_TABLET | Freq: Every evening | ORAL | Status: DC | PRN
  Administered 2013-12-20: 50 mg via ORAL
  Filled 2013-12-19: qty 1

## 2013-12-19 MED ORDER — ALUM & MAG HYDROXIDE-SIMETH 200-200-20 MG/5ML PO SUSP
30.0000 mL | ORAL | Status: DC | PRN
  Administered 2013-12-21: 30 mL via ORAL

## 2013-12-19 MED ORDER — MAGNESIUM HYDROXIDE 400 MG/5ML PO SUSP: 30.0000 mL | Freq: Every day | ORAL | Status: DC | PRN

## 2013-12-19 MED ORDER — PANTOPRAZOLE SODIUM 40 MG PO TBEC
80.0000 mg | DELAYED_RELEASE_TABLET | Freq: Every day | ORAL | Status: DC
  Administered 2013-12-19 – 2013-12-23 (×5): 80 mg via ORAL
  Filled 2013-12-19 (×6): qty 2

## 2013-12-19 NOTE — Tx Team (Signed)
Initial Interdisciplinary Treatment Plan  PATIENT STRENGTHS: (choose at least two) Ability for insight Capable of independent living Communication skills Motivation for treatment/growth  PATIENT STRESSORS: Financial difficulties Medication change or noncompliance Substance abuse   PROBLEM LIST: Problem List/Patient Goals Date to be addressed Date deferred Reason deferred Estimated date of resolution  Substance abuse      depression      Poor coping skills                                           DISCHARGE CRITERIA:  Ability to meet basic life and health needs Adequate post-discharge living arrangements Improved stabilization in mood, thinking, and/or behavior Withdrawal symptoms are absent or subacute and managed without 24-hour nursing intervention  PRELIMINARY DISCHARGE PLAN: Attend aftercare/continuing care group Attend PHP/IOP Attend 12-step recovery group  PATIENT/FAMIILY INVOLVEMENT: This treatment plan has been presented to and reviewed with the patient, Jacqueline [name].  The patient and family have been given the opportunity to ask questions and make suggestions.  Mindi Slicker M 12/19/2013, 2:36 AM

## 2013-12-19 NOTE — [facility] Group Notes (Signed)
[facility] Group Notes:  (Nursing/MHT/Case Management/Adjunct)  Date:  12/19/2013  Time:  1:31 PM  Type of Therapy:  Psychoeducational Skills  Participation Level:  Did Not Attend  Did not attend group and discussion on how attitude effects addiction despite staff encouragement to attend.    [name] 12/19/2013, 1:31 PM  
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
## 2013-12-19 NOTE — [facility] Suicide Risk Assessment (Signed)
   Nursing information obtained from:    Demographic factors:    Current Mental Status:    Loss Factors:    Historical Factors:    Risk Reduction Factors:    Total Time spent with patient: 30 minutes  CLINICAL FACTORS:   Severe Anxiety and/or Agitation Depression:   Aggression Anhedonia Comorbid alcohol abuse/dependence Hopelessness Impulsivity Insomnia Severe Alcohol/Substance Abuse/Dependencies  Psychiatric Specialty Exam: Physical Exam  Psychiatric: Her speech is normal. Thought content normal. Her mood appears anxious. Her affect is labile. She is agitated and aggressive. Cognition and memory are normal. She expresses impulsivity. She exhibits a depressed mood.    Review of Systems  Constitutional: Negative.   HENT: Negative.   Eyes: Negative.   Respiratory: Negative.   Cardiovascular: Negative.   Gastrointestinal: Negative.   Genitourinary: Negative.   Musculoskeletal: Negative.   Skin: Negative.   Neurological: Negative.   Endo/Heme/Allergies: Negative.   Psychiatric/Behavioral: Positive for depression and substance abuse. The patient is nervous/anxious and has insomnia.     Blood pressure 108/75, pulse 108, temperature 98.6 F (37 C), temperature source Oral, resp. rate 16, height 5' 9.5" (1.765 m), weight 86.183 kg (190 lb), last menstrual period 11/19/2013.Body mass index is 27.67 kg/(m^2).  General Appearance: Disheveled  Eye Contact::  Minimal  Speech:  Pressured  Volume:  Decreased  Mood:  Depressed and Irritable  Affect:  Labile  Thought Process:  Circumstantial  Orientation:  Full (Time, Place, and Person)  Thought Content:  Negative  Suicidal Thoughts:  No  Homicidal Thoughts:  No  Memory:  Immediate;   Fair Recent;   Fair Remote;   Fair  Judgement:  Poor  Insight:  Lacking  Psychomotor Activity:  Decreased  Concentration:  Fair  Recall:  [employer] of Knowledge:Fair  Language: Fair  Akathisia:  No  Handed:  Right  AIMS (if indicated):      Assets:  Communication Skills Desire for Improvement Physical Health  Sleep:  Number of Hours: 5.5   Musculoskeletal: Strength & Muscle Tone: within normal limits Gait & Station: normal Patient leans: N/A  COGNITIVE FEATURES THAT CONTRIBUTE TO RISK:  Closed-mindedness Polarized thinking    SUICIDE RISK:   Minimal: No identifiable suicidal ideation.  Patients presenting with no risk factors but with morbid ruminations; may be classified as minimal risk based on the severity of the depressive symptoms  PLAN OF CARE:1. Admit for crisis management and stabilization. 2. Medication management to reduce current symptoms to base line and improve the     patient's overall level of functioning 3. Treat health problems as indicated. 4. Develop treatment plan to decrease risk of relapse upon discharge and the need for     readmission. 5. Psycho-social education regarding relapse prevention and self care. 6. Health care follow up as needed for medical problems. 7. Restart home medications where appropriate.   I certify that inpatient services furnished can reasonably be expected to improve the patient's condition.  Corena Pilgrim, MD 12/19/2013, 9:38 AM

## 2013-12-19 NOTE — Progress Notes (Signed)
Patient ID: Jacqueline [name], female   DOB: [birth year], 48 y.o.   MRN: [id number]  D: Pt has been very flat and depressed on the unit, she did not attend any morning groups and has not engaged in any treatment. Pt reported that she was having some withdrawal symptoms, prn medication was given. Pt continued to be isolative on the unit and remained in the bed must of the day. Pt reported being negative SI/HI, no AH/VH noted. A: 15 min checks continued for patient safety. R: Pt safety maintained.

## 2013-12-19 NOTE — Progress Notes (Signed)
Psychoeducational Group Note  Date:  12/19/2013 Time: 2100  Group Topic/Focus:  wrap up group  Participation Level: Did Not Attend  Participation Quality:  Not Applicable  Affect:  Not Applicable  Cognitive:  Not Applicable  Insight:  Not Applicable  Engagement in Group: Not Applicable  Additional Comments: Pt remained in bed during group time.  Jacques Navy 12/19/2013, 10:17 PM

## 2013-12-19 NOTE — [facility] Group Notes (Signed)
[place] Group Notes:  (Nursing/MHT/Case Management/Adjunct)  Date:  12/19/2013  Time:  10:43 AM  Type of Therapy:  Psychoeducational Skills  Participation Level:  Did Not Attend  Summary of Progress/Problems: Pt did not attend morning group.  Benancio Deeds Shanta 12/19/2013, 10:43 AM

## 2013-12-19 NOTE — [facility] Group Notes (Signed)
[place] Group Notes: (Clinical Social Work)   12/19/2013      Type of Therapy:  Group Therapy   Participation Level:  Did Not Attend    Selmer Dominion, LCSW 12/19/2013, 12:09 PM

## 2013-12-19 NOTE — Progress Notes (Signed)
[place] Group Notes:  (Nursing/MHT/Case Management/Adjunct)  Date:  12/19/2013  Time:  6:24 PM  Type of Therapy:  Psychoeducational Skills  Participation Level:  Did Not Attend  Summary of Progress/Problems: Pt was in bed asleep.  Clint Bolder 12/19/2013, 6:24 PM

## 2013-12-20 MED ORDER — PHENAZOPYRIDINE HCL 100 MG PO TABS
100.0000 mg | ORAL_TABLET | Freq: Three times a day (TID) | ORAL | Status: AC
  Administered 2013-12-20 – 2013-12-23 (×9): 100 mg via ORAL
  Filled 2013-12-20 (×10): qty 1

## 2013-12-20 MED ORDER — SULFAMETHOXAZOLE-TRIMETHOPRIM 400-80 MG PO TABS
1.0000 | ORAL_TABLET | Freq: Two times a day (BID) | ORAL | Status: DC
  Filled 2013-12-20 (×3): qty 1

## 2013-12-20 MED ORDER — SULFAMETHOXAZOLE-TMP DS 800-160 MG PO TABS
1.0000 | ORAL_TABLET | Freq: Two times a day (BID) | ORAL | Status: DC
  Administered 2013-12-20 – 2013-12-23 (×7): 1 via ORAL
  Filled 2013-12-20 (×11): qty 1

## 2013-12-20 NOTE — Progress Notes (Signed)
Patient did attend the evening speaker AA meeting.  

## 2013-12-20 NOTE — [facility] Group Notes (Signed)
[place] Group Notes: (Clinical Social Work)   12/20/2013      Type of Therapy:  Group Therapy   Participation Level:  Did Not Attend    Selmer Dominion, LCSW 12/20/2013, 12:28 PM

## 2013-12-20 NOTE — Progress Notes (Signed)
  D: Pt observed sleeping in bed with eyes closed. RR even and unlabored. No distress noted  .  A: Q 15 minute checks were done for safety.  R: safety maintained on unit.  

## 2013-12-20 NOTE — BH Assessment (Signed)
D: Pt continues to be very flat and depressed on the unit, she  Continues to not attend any morning groups and has not engaged in any treatment. Pt continues to report that she was having some withdrawal symptoms, prn medication was given. Pt continued to be isolative on the unit and remained in the bed must of the day. Pt reported on her self inventory sheet that her depression was a 7, and her helplessness was a 3. Pt reported pain during urination, she was seen by Mickel Baas NP new orders noted. Pt reported being negative SI/HI, no AH/VH noted. A: 15 min checks continued for patient safety. R: Pt safety maintained.

## 2013-12-20 NOTE — Progress Notes (Signed)
[facility] Group Notes:  (Nursing/MHT/Case Management/Adjunct)  Date:  12/20/2013  Time:  6:18 PM  Type of Therapy:  Psychoeducational Skills  Participation Level:  Did Not Attend  Summary of Progress/Problems: Pt was in bed at the time of group.  Kambryn Dapolito C 12/20/2013, 6:18 PM 

## 2013-12-20 NOTE — Progress Notes (Signed)
D: Pt denies SI/HI/AVH. Pt is pleasant and cooperative. Pt stated she has been feeling better, the pyridium has been helping her pain.  A: Pt was offered support and encouragement. Pt was given scheduled medications. Pt was encourage to attend groups. Q 15 minute checks were done for safety.   R:Pt attends groups and interacts well with peers and staff. Pt is taking medication. Pt has no complaints at this time .Pt receptive to treatment and safety maintained on unit.

## 2013-12-20 NOTE — BHH Counselor (Signed)
  Adult Psychosocial Assessment Update  Interdisciplinary Team  Previous [facility] admissions/discharges:  Admissions  Discharges   Date: 08/03/13  Date: 08/10/13   Date: 05-13-13  Date: 05-14-13   Date: 04-14-13  Date: 04-20-13   Date: 03-29-13  Date: 04-01-13   Date: 12-22-12  Date: 11-28-12   Changes since the last Psychosocial Assessment (including adherence to outpatient mental health and/or substance abuse treatment, situational issues contributing to decompensation and/or relapse).  She reports she relapsed on alcohol a couple of days ago and has been drinking approximately a case of beer daily for last several weeks. She entered ED with SI and reports that depression has been worsening for last several weeks with relapse.  Pt has been living in a halfway house and reports she cannot return there. She has been in treatment at many different facility referrals from [facility]. She reports a history of hypothyroidism and says she has not been taking her synthroid due to intoxication. She has three children, ages 38, 22 and 76, who live with her mother and she identifies them as her primary support.             Discharge Plan  1. Will you be returning to the same living situation after discharge?  Yes:  No: X If no, what is your plan?    Patient unable to return to [facility] in [facility] due to using alcohol on the   Property. Pt hopes to go home to family who will be returning from out of town Tuesday   evening   2. Would you like a referral for services when you are discharged?  Yes: If yes, for what services?  No:    Wants outpatient in [place] with [facility] if possible for both med mgt   And therapy     Summary and Recommendations (to be completed by the evaluator)  Pt is an unemployed 48 y.o. female, single, Caucasian who presents requesting detox   From alcohol for the fifth time in the last 12 months. Pt has diagnosis of Alcohol   Dependence; Substance  Induced Mood Disorder Patient would benefit from crisis stabilization, medication evaluation, therapy groups for processing thoughts/feelings/experiences, psycho ed groups for increasing coping skills, and aftercare planning                   Signature: Lyla Glassing, 12/19/2013 4:09 PM

## 2013-12-20 NOTE — Progress Notes (Signed)
[facility] MD Progress Note  12/20/2013 11:36 AM Jacqueline [name]  MRN:  [id number] Subjective:   Patient states "I'm feeling a little better. I got up for group. The medicines keep me from feeling sick. My depression is a seven today. I had to lay down because I'm tired. Also feeling very irritable today."  Objective:  Patient more visible on the unit today. She is dressed and making an effort to attend groups. Patient endorses alcohol withdrawal symptoms, depression, and feeling very irritable. She is pleasant during her interview with this Probation officer. Jacqueline [name] gives very brief answers to questions and does not forward information easily. Appears severely depressed.   Diagnosis:   DSM5: Total Time spent with patient: 20 minutes AXIS I: Alcohol dependence with intoxication, Substance induced mood disorder, Unspecified episodic mood disorder  AXIS II: Deferred  AXIS III:  Past Medical History   Diagnosis  Date   .  Peptic ulcer    .  Alcohol abuse    .  Anemia    .  Benzodiazepine abuse    .  Thyroid disease    .  Alcoholism    .  Narcotic abuse    .  Back pain    .  Thrombocytopenia  06/17/2011   .  Hypothyroidism    .  Seizures    .  Medical history non-contributory      hypoglycemic    AXIS IV: housing problems, occupational problems, other psychosocial or environmental problems and problems with primary support group  AXIS V: 41-50 serious symptoms   ADL's:  Intact  Sleep: Fair  Appetite:  Fair  Suicidal Ideation:  Denies Homicidal Ideation:  Denies AEB (as evidenced by):  Psychiatric Specialty Exam: Physical Exam  Review of Systems  Constitutional: Positive for malaise/fatigue.  HENT: Negative.   Eyes: Negative.   Respiratory: Negative.   Cardiovascular: Negative.   Gastrointestinal: Positive for nausea.  Genitourinary: Positive for dysuria, urgency and frequency.       Patient reports her urine has a foul odor.   Skin: Negative.   Neurological: Positive for  tremors.  Endo/Heme/Allergies: Negative.   Psychiatric/Behavioral: Positive for depression and substance abuse. Negative for suicidal ideas, hallucinations and memory loss. The patient is nervous/anxious. The patient does not have insomnia.     Blood pressure 97/69, pulse 92, temperature 98.1 F (36.7 C), temperature source Oral, resp. rate 20, height 5' 9.5" (1.765 m), weight 86.183 kg (190 lb), last menstrual period 11/19/2013.Body mass index is 27.67 kg/(m^2).  General Appearance: Casual  Eye Contact::  Fair  Speech:  Slow  Volume:  Decreased  Mood:  Depressed and Irritable  Affect:  Restricted  Thought Process:  Intact  Orientation:  Full (Time, Place, and Person)  Thought Content:  WDL  Suicidal Thoughts:  No  Homicidal Thoughts:  No  Memory:  Immediate;   Good Recent;   Fair Remote;   Fair  Judgement:  Impaired  Insight:  Lacking  Psychomotor Activity:  Decreased  Concentration:  Fair  Recall:  [employer] of Knowledge:Good  Language: Good  Akathisia:  No  Handed:  Right  AIMS (if indicated):     Assets:  Communication Skills Desire for Improvement Leisure Time Resilience  Sleep:  Number of Hours: 5.75   Musculoskeletal: Strength & Muscle Tone: within normal limits Gait & Station: normal Patient leans: N/A  Current Medications: Current Facility-Administered Medications  Medication Dose Route Frequency Provider Last Rate Last Dose  . acetaminophen (TYLENOL) tablet 650 mg  650 mg Oral Q6H PRN Lurena Nida, NP   650 mg at 12/19/13 1648  . alum & mag hydroxide-simeth (MAALOX/MYLANTA) 200-200-20 MG/5ML suspension 30 mL  30 mL Oral Q4H PRN Lurena Nida, NP      . ARIPiprazole (ABILIFY) tablet 10 mg  10 mg Oral Daily Lurena Nida, NP   10 mg at 12/20/13 0827  . buPROPion (WELLBUTRIN XL) 24 hr tablet 300 mg  300 mg Oral Daily Lurena Nida, NP   300 mg at 12/20/13 0827  . chlordiazePOXIDE (LIBRIUM) capsule 25 mg  25 mg Oral Q6H PRN Lurena Nida, NP      .  chlordiazePOXIDE (LIBRIUM) capsule 25 mg  25 mg Oral QID Lurena Nida, NP   25 mg at 12/20/13 0830   Followed by  . chlordiazePOXIDE (LIBRIUM) capsule 25 mg  25 mg Oral TID Lurena Nida, NP       Followed by  . [START ON 12/21/2013] chlordiazePOXIDE (LIBRIUM) capsule 25 mg  25 mg Oral BH-qamhs Lurena Nida, NP       Followed by  . [START ON 12/23/2013] chlordiazePOXIDE (LIBRIUM) capsule 25 mg  25 mg Oral Daily Lurena Nida, NP      . FLUoxetine (PROZAC) capsule 40 mg  40 mg Oral Daily Lurena Nida, NP   40 mg at 12/20/13 0827  . gabapentin (NEURONTIN) capsule 800 mg  800 mg Oral TID Lurena Nida, NP   800 mg at 12/20/13 0827  . hydrOXYzine (ATARAX/VISTARIL) tablet 25 mg  25 mg Oral Q6H PRN Lurena Nida, NP   25 mg at 12/20/13 0531  . levothyroxine (SYNTHROID, LEVOTHROID) tablet 125 mcg  125 mcg Oral QAC breakfast Lurena Nida, NP   125 mcg at 12/20/13 0631  . loperamide (IMODIUM) capsule 2-4 mg  2-4 mg Oral PRN Lurena Nida, NP      . magnesium hydroxide (MILK OF MAGNESIA) suspension 30 mL  30 mL Oral Daily PRN Lurena Nida, NP      . multivitamin with minerals tablet 1 tablet  1 tablet Oral Daily Lurena Nida, NP   1 tablet at 12/19/13 1048  . nicotine (NICODERM CQ - dosed in mg/24 hours) patch 14 mg  14 mg Transdermal Daily Nicholaus Bloom, MD   14 mg at 12/20/13 0829  . ondansetron (ZOFRAN-ODT) disintegrating tablet 4 mg  4 mg Oral Q6H PRN Lurena Nida, NP   4 mg at 12/20/13 0531  . pantoprazole (PROTONIX) EC tablet 80 mg  80 mg Oral Daily Lurena Nida, NP   80 mg at 12/20/13 0827  . thiamine (B-1) injection 100 mg  100 mg Intramuscular Once Lurena Nida, NP      . thiamine (VITAMIN B-1) tablet 100 mg  100 mg Oral Daily Lurena Nida, NP   100 mg at 12/20/13 0827  . traZODone (DESYREL) tablet 50 mg  50 mg Oral QHS PRN Lurena Nida, NP        Lab Results:  Results for orders placed during the hospital encounter of 12/19/13 (from the past 48 hour(s))  URINALYSIS, ROUTINE W REFLEX  MICROSCOPIC     Status: Abnormal   Collection Time    12/19/13  4:06 PM      Result Value Ref Range   Color, Urine YELLOW  YELLOW   APPearance CLOUDY (*) CLEAR   Specific Gravity, Urine 1.009  1.005 - 1.030   pH  6.0  5.0 - 8.0   Glucose, UA NEGATIVE  NEGATIVE mg/dL   Hgb urine dipstick NEGATIVE  NEGATIVE   Bilirubin Urine NEGATIVE  NEGATIVE   Ketones, ur NEGATIVE  NEGATIVE mg/dL   Protein, ur NEGATIVE  NEGATIVE mg/dL   Urobilinogen, UA 0.2  0.0 - 1.0 mg/dL   Nitrite POSITIVE (*) NEGATIVE   Leukocytes, UA SMALL (*) NEGATIVE   Comment: Performed at [facility]     Status: Abnormal   Collection Time    12/19/13  4:06 PM      Result Value Ref Range   Squamous Epithelial / LPF FEW (*) RARE   WBC, UA 11-20  <3 WBC/hpf   RBC / HPF 0-2  <3 RBC/hpf   Bacteria, UA MANY (*) RARE   Comment: Performed at [facility]    Physical Findings: AIMS:  , ,  ,  ,    CIWA:  CIWA-Ar Total: 8 COWS:     Treatment Plan Summary: Daily contact with patient to assess and evaluate symptoms and progress in treatment Medication management  Plan: Continue crisis management and stabilization.  Medication management: Reviewed with patient who stated no untoward effects. Continue Wellbutrin, Abilify, Neurontin, and Prozac as ordered for improved mood. Continue Librium protocol for alcohol detox.  Encouraged patient to attend groups and participate in group counseling sessions and activities.  Discharge plan in progress.  Continue current treatment plan.  Address health issues: Vitals reviewed and stable. Start Bactrim every twelve hours and Pyridium of UTI. UA positive for nitrites, leukocytes, and bacteria.   Medical Decision Making Problem Points:  Established problem, stable/improving (1), New problem, with no additional work-up planned (3), Review of last therapy session (1) and Review of psycho-social stressors (1) Data Points:  Review or  order clinical lab tests (1) Review of medication regiment & side effects (2) Review of new medications or change in dosage (2)  I certify that inpatient services furnished can reasonably be expected to improve the patient's condition.   Elmarie Shiley NP-C 12/20/2013, 11:36 AM  Patient seen, evaluated and I agree with notes by Nurse Practitioner. Corena Pilgrim, MD

## 2013-12-20 NOTE — BHH Group Notes (Signed)
Psychoeducational Group Note  Date:  12/20/2013 Time:  0900  Group Topic/Focus:  Orientation:   The focus of this group is to educate the patient on the purpose and policies of crisis stabilization and provide a format to answer questions about their admission.  The group details unit policies and expectations of patients while admitted.  Participation Level: appropriate Participation Quality:appropriate    Affect: appropriate  Cognitive:  appropriate   Insight: appropriate  Engagement in Group: Engaged Additional Comments:    Karel Jarvis 12/20/2013, 10:36 AM

## 2013-12-21 DIAGNOSIS — F410 Panic disorder [episodic paroxysmal anxiety] without agoraphobia: Secondary | ICD-10-CM

## 2013-12-21 DIAGNOSIS — F411 Generalized anxiety disorder: Secondary | ICD-10-CM

## 2013-12-21 LAB — COMPREHENSIVE METABOLIC PANEL
ALBUMIN: 3.7 g/dL (ref 3.5–5.2)
ALK PHOS: 70 U/L (ref 39–117)
ALT: 64 U/L — ABNORMAL HIGH (ref 0–35)
AST: 66 U/L — ABNORMAL HIGH (ref 0–37)
BUN: 10 mg/dL (ref 6–23)
CO2: 25 mEq/L (ref 19–32)
CREATININE: 1.11 mg/dL — AB (ref 0.50–1.10)
Calcium: 8.7 mg/dL (ref 8.4–10.5)
Chloride: 105 mEq/L (ref 96–112)
GFR calc non Af Amer: 59 mL/min — ABNORMAL LOW (ref >90)
GFR, EST AFRICAN AMERICAN: 68 mL/min — AB (ref >90)
Glucose, Bld: 82 mg/dL (ref 70–99)
POTASSIUM: 4.5 meq/L (ref 3.7–5.3)
Sodium: 140 mEq/L (ref 137–147)
TOTAL PROTEIN: 6.9 g/dL (ref 6.0–8.3)

## 2013-12-21 MED ORDER — BISACODYL 10 MG RE SUPP
10.0000 mg | Freq: Once | RECTAL | Status: AC
  Administered 2013-12-21: 10 mg via RECTAL
  Filled 2013-12-21 (×2): qty 1

## 2013-12-21 MED ORDER — ARIPIPRAZOLE 5 MG PO TABS
5.0000 mg | ORAL_TABLET | Freq: Every day | ORAL | Status: DC
  Administered 2013-12-22 – 2013-12-23 (×2): 5 mg via ORAL
  Filled 2013-12-21 (×3): qty 1

## 2013-12-21 MED ORDER — TRAMADOL HCL 50 MG PO TABS
50.0000 mg | ORAL_TABLET | Freq: Four times a day (QID) | ORAL | Status: DC | PRN
  Administered 2013-12-21 – 2013-12-23 (×7): 50 mg via ORAL
  Filled 2013-12-21 (×7): qty 1

## 2013-12-21 MED ORDER — BISACODYL 5 MG PO TBEC
10.0000 mg | DELAYED_RELEASE_TABLET | Freq: Once | ORAL | Status: AC
  Administered 2013-12-21: 10 mg via ORAL
  Filled 2013-12-21: qty 2

## 2013-12-21 MED ORDER — MUSCLE RUB 10-15 % EX CREA: TOPICAL_CREAM | CUTANEOUS | Status: DC | PRN

## 2013-12-21 NOTE — Progress Notes (Signed)
Patient ID: Jacqueline [name], female   DOB: [birth year], 48 y.o.   MRN: [id number] She has been up and to most of the groups, interacting with peers and staff, Self inventory: depression 7, hopelessness 3 withdrals of craving,agitation and chilling this AM. Physical problems lightheadness,dizziness and headache. She has requested and received prn medication today for pain twice, indigestion, constipation and nausea all were effective.

## 2013-12-21 NOTE — BHH Group Notes (Signed)
[facility] LCSW Aftercare Discharge Planning [name] Note   12/21/2013 10:51 AM  Participation Quality:  Appropriate   Mood/Affect:  Flat and Depressed   Depression Rating:  7  Anxiety Rating:  7  Thoughts of Suicide:  No Will you contract for safety?   NA  Current AVH:  No  Plan for Discharge/Comments:  Pt reports that she stayed sober for about 4 months (while at [facility], then [facility] services) but relapsed about 3 weeks ago-identified main trigger as family issues. Pt stated that she feels depressed but has ben going to [name] for med management and has been compliant with medications. Denies SI/HI/AVH. Pt reports that she plans to return to her family's home in [name] and plans to follow up with [name] (sliding scale fee) for med management and therapy.   Transportation Means: family member  Supports: daughters and her mother.   Jacqueline [name], [place] [name]

## 2013-12-21 NOTE — Progress Notes (Signed)
Adult Psychoeducational Group Note  Date:  12/21/2013 Time:  10:00 am  Group Topic/Focus:  Wellness Toolbox:   The focus of this group is to discuss various aspects of wellness, balancing those aspects and exploring ways to increase the ability to experience wellness.  Patients will create a wellness toolbox for use upon discharge.  Participation Level:  Active  Participation Quality:  Appropriate, Sharing and Supportive  Affect:  Appropriate  Cognitive:  Appropriate  Insight: Appropriate  Engagement in Group:  Engaged  Modes of Intervention:  Discussion, Education, Socialization and Support  Additional Comments:  Pt stated that she needs to get involved with AA and treat herself to nice things to assist her with improving her health and wellness. Pt stated that low self esteem and using drugs and alcohol have been barriers to her progress.   Nader Boys 12/21/2013, 10:46 AM

## 2013-12-21 NOTE — Progress Notes (Signed)
Adult Psychoeducational Group Note  Date:  12/21/2013 Time:  8:00PM Group Topic/Focus:  Wrap-Up Group:   The focus of this group is to help patients review their daily goal of treatment and discuss progress on daily workbooks.  Participation Level:  Active  Participation Quality:  Appropriate  Affect:  Appropriate  Cognitive:  Appropriate  Insight: Appropriate  Engagement in Group:  Engaged  Modes of Intervention:  Discussion  Additional Comments:  Pt attended AA  Theodoro Grist D 12/21/2013, 8:10 PM
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
## 2013-12-21 NOTE — Tx Team (Signed)
Interdisciplinary Treatment Plan Update (Adult)  Date: 12/21/2013   Time Reviewed: 10:55 AM  Progress in Treatment:  Attending groups: yes  Participating in groups:  Yes  Taking medication as prescribed: Yes  Tolerating medication: Yes  Family/Significant othe contact made: No. SPE not required for this pt.  Patient understands diagnosis: Yes, AEB seeking treatment for ETOH detox, medication management, and mood stabilization.  Discussing patient identified problems/goals with staff: Yes  Medical problems stabilized or resolved: Yes  Denies suicidal/homicidal ideation: Yes during group/self report.  Patient has not harmed self or Others: Yes  New problem(s) identified:  Discharge Plan or Barriers: Pt reports that she stayed sober for about 4 months ([facility] at [facility], then [facility]) but relapsed about 3 weeks ago-identified main trigger as family issues. Pt stated that she feels depressed but has ben going to [facility] for med management and has been compliant with medications. Denies SI/HI/AVH. Pt reports that she plans to return to her family's home in [place] and plans to follow up with [facility] (sliding scale fee) for med management and therapy.  Additional comments: Livana Yerian is a 48 year old female who presented to [facility] for alcohol detox. She denies use of other substances but her urine drug screen was positive for cocaine and benzos. Patient states "I relapsed. Been drinking beer for five weeks. About a case of beer a day. I have been depressed for a long time. I am having withdrawal symptoms of feeling shaky, nauseated, abdominal cramps and headache. I don't feel good." She was last on the [facility] hall for detox in October of 2014.  Reason for Continuation of Hospitalization: Librium taper-withdrawals Mood stabilization Medication management  Estimated length of stay: 2-3 days (likely d/c WED) For review of initial/current patient goals, please see plan of care.   Attendees:  Patient:    Family:    Physician: Carlton Adam  12/21/2013 10:54 AM   Nursing: Butch Penny RN  12/21/2013 10:54 AM   Clinical Social Worker [place], [place]  12/21/2013 10:54 AM   Other: Hardie Pulley. PA 12/21/2013 10:54 AM   Other:    Other: Gerline Legacy Nurse CM  12/21/2013 10:54 AM   Other:    Scribe for Treatment Team:  [place] LCSWA 12/21/2013 10:54 AM

## 2013-12-21 NOTE — BHH Suicide Risk Assessment (Signed)
[facility] INPATIENT: Family/Significant Other Suicide Prevention Education  Suicide Prevention Education:  Education Completed; No one has been identified by the patient as the family member/significant other with whom the patient will be residing, and identified as the person(s) who will aid the patient in the event of a mental health crisis (suicidal ideations/suicide attempt).   Pt did not c/o SI at admission, nor have they endorsed SI during their stay here. SPE not required. SPI pamphlet provided to pt and she was encouraged to share information with support network, ask questions, and talk about any concerns relating to SPE.  [place], [place] 12/21/2013 1:02 PM

## 2013-12-21 NOTE — Progress Notes (Signed)
D   Pt is depressed and sad   She reports some anxiety and stress over her daughters situation   She interacts well with others and isolates  A   Verbal support given   Medications administered and effectiveness monitored   Q 15 min checks  Discussed UTI and increasing fluids especially water R   Pt safe at present
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
## 2013-12-22 DIAGNOSIS — F3130 Bipolar disorder, current episode depressed, mild or moderate severity, unspecified: Secondary | ICD-10-CM

## 2013-12-22 MED ORDER — TRAZODONE HCL 100 MG PO TABS
ORAL_TABLET | ORAL | Status: AC
  Filled 2013-12-22: qty 1

## 2013-12-22 MED ORDER — FLEET ENEMA 7-19 GM/118ML RE ENEM
1.0000 | ENEMA | Freq: Once | RECTAL | Status: AC
  Administered 2013-12-22: 1 via RECTAL
  Filled 2013-12-22: qty 1

## 2013-12-22 MED ORDER — TRAZODONE HCL 100 MG PO TABS
100.0000 mg | ORAL_TABLET | Freq: Every evening | ORAL | Status: DC | PRN
  Administered 2013-12-22: 100 mg via ORAL
  Filled 2013-12-22: qty 14

## 2013-12-22 NOTE — Progress Notes (Signed)
Recreation Therapy Notes  Animal-Assisted Activity/Therapy (AAA/T) Program Checklist/Progress Notes Patient Eligibility Criteria Checklist & Daily Group note for Rec Tx Intervention  Date: 03.17.2015 Time: 2:45am Location: [address]   AAA/T Program Assumption of Risk Form signed by Patient/ or Parent Legal Guardian yes  Patient is free of allergies or sever asthma yes  Patient reports no fear of animals yes  Patient reports no history of cruelty to animals yes   Patient understands his/her participation is voluntary yes  Behavioral Response: DID NOT ATTEND    Laureen Ochs Levenia Skalicky, LRT/CTRS  Boaz Berisha L 12/22/2013 4:18 PM

## 2013-12-22 NOTE — BHH Group Notes (Signed)
[facility] LCSW Group Therapy  12/22/2013 3:09 PM  Type of Therapy:  Group Therapy  Participation Level:  Active  Participation Quality:  Attentive  Affect:  Appropriate  Cognitive:  Alert and Oriented  Insight:  Improving  Engagement in Therapy:  Improving  Modes of Intervention:  Discussion, Education, Exploration, Problem-solving, Rapport Building, Socialization and Support  Summary of Progress/Problems: MHA Speaker came to talk about his personal journey with substance abuse and addiction. The pt processed ways by which to relate to the speaker. Dane speaker provided handouts and educational information pertaining to groups and services offered by the [facility]. Jacqueline [name] was attentive and engaged throughout today's therapy group. She remained attentive and actively listened as the speaker shared his personal story with SA and MI. Jacqueline [name] followed along as speaker reviewed various services offered by [facility].    Smart, [name] LCSWA  12/22/2013, 3:09 PM

## 2013-12-22 NOTE — Progress Notes (Signed)
Patient resting quietly with eyes closed. Respirations even and unlabored. No distress noted. Q 15 minute check continues as ordered to maintain safety,

## 2013-12-22 NOTE — BHH Group Notes (Signed)
Adult Psychoeducational Group Note  Date:  12/22/2013 Time:  0900am  Group Topic/Focus:  Goals Group:   The focus of this group is to help patients establish daily goals to achieve during treatment and discuss how the patient can incorporate goal setting into their daily lives to aide in recovery. Orientation:   The focus of this group is to educate the patient on the purpose and policies of crisis stabilization and provide a format to answer questions about their admission.  The group details unit policies and expectations of patients while admitted.  Participation Level:  Active  Participation Quality:  Appropriate and Attentive  Affect:  Appropriate  Cognitive:  Alert and Appropriate  Insight: Good  Engagement in Group:  Engaged and Supportive  Modes of Intervention:  Discussion, Education and Support  Additional Comments:  Pt was engaged in group, set goal today to talk with her doctor about her medication and lab results, pt displayed a positive attitude and was eager to share.  Wynn Banker 12/22/2013, 9:32 AM

## 2013-12-22 NOTE — Progress Notes (Signed)
[facility] MD Progress Note  12/22/2013 7:13 PM Jacqueline [name]  MRN:  [id number] Subjective:  Continues to point out to vague physical symptoms. She admits to anxiety, worry, mood instability. States he is worried about her future. States that last time she worked she took off as was having an anxiety attack. If she cant work and cant be granted disability she doe not know how she i going to make it. She has been institutionalized most of the year and that has been the only way she has been able to fucntion Diagnosis:   DSM5: Schizophrenia Disorders:  none Obsessive-Compulsive Disorders:  none Trauma-Stressor Disorders:  none Substance/Addictive Disorders:  Alcohol Related Disorder - Severe (303.90) Depressive Disorders:  Major Depressive Disorder - Severe (296.23) Total Time spent with patient: 30 minutes  Axis I: Bipolar, Depressed, Generalized Anxiety Disorder and Panic Disorder  ADL's:  Intact  Sleep: Fair  Appetite:  Fair  Suicidal Ideation:  Plan:  denies Intent:  denies Means:  denies Homicidal Ideation:  Plan:  denies Intent:  denies Means:  denies AEB (as evidenced by):  Psychiatric Specialty Exam: Physical Exam  Review of Systems  Constitutional: Positive for malaise/fatigue.  HENT: Negative.   Eyes: Negative.   Respiratory: Negative.   Cardiovascular: Negative.   Gastrointestinal: Negative.   Genitourinary: Positive for urgency.  Musculoskeletal: Positive for back pain.  Skin: Negative.   Neurological: Positive for tremors and weakness.  Endo/Heme/Allergies: Negative.   Psychiatric/Behavioral: Positive for depression and substance abuse. The patient is nervous/anxious.     Blood pressure 97/63, pulse 65, temperature 98.2 F (36.8 C), temperature source Oral, resp. rate 18, height 5' 9.5" (1.765 m), weight 86.183 kg (190 lb), last menstrual period 11/19/2013.Body mass index is 27.67 kg/(m^2).  General Appearance: Fairly Groomed  Engineer, water::  Fair  Speech:   Clear and Coherent  Volume:  Decreased  Mood:  Anxious, Depressed, Dysphoric and worried  Affect:  anxious, worried  Thought Process:  Coherent and Goal Directed  Orientation:  Full (Time, Place, and Person)  Thought Content:  worries, concerns, symptoms  Suicidal Thoughts:  No  Homicidal Thoughts:  No  Memory:  Immediate;   Fair Recent;   Fair Remote;   Fair  Judgement:  Fair  Insight:  Present and Shallow  Psychomotor Activity:  Restlessness  Concentration:  Fair  Recall:  [employer] of Knowledge:NA  Language: Fair  Akathisia:  No  Handed:    AIMS (if indicated):     Assets:  Desire for Improvement  Sleep:  Number of Hours: 6   Musculoskeletal: Strength & Muscle Tone: within normal limits Gait & Station: normal Patient leans: N/A  Current Medications: Current Facility-Administered Medications  Medication Dose Route Frequency Provider Last Rate Last Dose  . acetaminophen (TYLENOL) tablet 650 mg  650 mg Oral Q6H PRN Lurena Nida, NP   650 mg at 12/22/13 0809  . alum & mag hydroxide-simeth (MAALOX/MYLANTA) 200-200-20 MG/5ML suspension 30 mL  30 mL Oral Q4H PRN Lurena Nida, NP   30 mL at 12/21/13 0925  . ARIPiprazole (ABILIFY) tablet 5 mg  5 mg Oral Daily Nicholaus Bloom, MD   5 mg at 12/22/13 0804  . buPROPion (WELLBUTRIN XL) 24 hr tablet 300 mg  300 mg Oral Daily Lurena Nida, NP   300 mg at 12/22/13 0630  . [START ON 12/23/2013] chlordiazePOXIDE (LIBRIUM) capsule 25 mg  25 mg Oral Daily Lurena Nida, NP      . FLUoxetine (  PROZAC) capsule 40 mg  40 mg Oral Daily Lurena Nida, NP   40 mg at 12/22/13 0802  . gabapentin (NEURONTIN) capsule 800 mg  800 mg Oral TID Lurena Nida, NP   800 mg at 12/22/13 1652  . levothyroxine (SYNTHROID, LEVOTHROID) tablet 125 mcg  125 mcg Oral QAC breakfast Lurena Nida, NP   125 mcg at 12/22/13 0539  . magnesium hydroxide (MILK OF MAGNESIA) suspension 30 mL  30 mL Oral Daily PRN Lurena Nida, NP      . multivitamin with minerals tablet 1  tablet  1 tablet Oral Daily Lurena Nida, NP   1 tablet at 12/22/13 0803  . MUSCLE RUB CREA   Topical PRN Nicholaus Bloom, MD      . nicotine (NICODERM CQ - dosed in mg/24 hours) patch 14 mg  14 mg Transdermal Daily Nicholaus Bloom, MD   14 mg at 12/22/13 0803  . pantoprazole (PROTONIX) EC tablet 80 mg  80 mg Oral Daily Lurena Nida, NP   80 mg at 12/22/13 0802  . phenazopyridine (PYRIDIUM) tablet 100 mg  100 mg Oral TID WC Elmarie Shiley, NP   100 mg at 12/22/13 1652  . sulfamethoxazole-trimethoprim (BACTRIM DS) 800-160 MG per tablet 1 tablet  1 tablet Oral Q12H Elmarie Shiley, NP   1 tablet at 12/22/13 0803  . thiamine (B-1) injection 100 mg  100 mg Intramuscular Once Lurena Nida, NP      . thiamine (VITAMIN B-1) tablet 100 mg  100 mg Oral Daily Lurena Nida, NP   100 mg at 12/22/13 0803  . traMADol (ULTRAM) tablet 50 mg  50 mg Oral Q6H PRN Nicholaus Bloom, MD   50 mg at 12/22/13 1819  . traZODone (DESYREL) tablet 50 mg  50 mg Oral QHS PRN Lurena Nida, NP   50 mg at 12/20/13 2202    Lab Results:  Results for orders placed during the hospital encounter of 12/19/13 (from the past 48 hour(s))  COMPREHENSIVE METABOLIC PANEL     Status: Abnormal   Collection Time    12/21/13  7:40 PM      Result Value Ref Range   Sodium 140  137 - 147 mEq/L   Potassium 4.5  3.7 - 5.3 mEq/L   Chloride 105  96 - 112 mEq/L   CO2 25  19 - 32 mEq/L   Glucose, Bld 82  70 - 99 mg/dL   BUN 10  6 - 23 mg/dL   Creatinine, Ser 1.11 (*) 0.50 - 1.10 mg/dL   Calcium 8.7  8.4 - 10.5 mg/dL   Total Protein 6.9  6.0 - 8.3 g/dL   Albumin 3.7  3.5 - 5.2 g/dL   AST 66 (*) 0 - 37 U/L   ALT 64 (*) 0 - 35 U/L   Alkaline Phosphatase 70  39 - 117 U/L   Total Bilirubin <0.2 (*) 0.3 - 1.2 mg/dL   GFR calc non Af Amer 59 (*) >90 mL/min   GFR calc Af Amer 68 (*) >90 mL/min   Comment: (NOTE)     The eGFR has been calculated using the CKD EPI equation.     This calculation has not been validated in all clinical situations.     eGFR's  persistently <90 mL/min signify possible Chronic Kidney     Disease.     Performed at [facility]    Physical Findings: AIMS:  , ,  ,  ,  CIWA:  CIWA-Ar Total: 5 COWS:     Treatment Plan Summary: Daily contact with patient to assess and evaluate symptoms and progress in treatment Medication management  Plan: Supportive approach/coping skills/relapse prevention           Continue her current regime with a lower dose of Abilify            Review labs: seems that liver and kidney functions are improving  Medical Decision Making Problem Points:  Review of psycho-social stressors (1) Data Points:  Review or order clinical lab tests (1) Review of medication regiment & side effects (2) Review of new medications or change in dosage (2)  I certify that inpatient services furnished can reasonably be expected to improve the patient's condition.   Beth Goodlin A 12/22/2013, 7:13 PM

## 2013-12-22 NOTE — Progress Notes (Signed)
Patient ID: Jacqueline [name], female   DOB: 05-06-1966, 48 y.o.   MRN: [id number] She has been up and to part of the groups interacting with peers and staff. Self inventory: depression 6, hopeaslessness 0, withdrawals of craving,agitation,and chilling. She denies SI thoughts. She has requested and received prn medication twice for back pain and a endemia  for constipation, all were effective.

## 2013-12-22 NOTE — Progress Notes (Signed)
Adult Psychoeducational Group Note  Date:  12/22/2013 Time:  10:00 am  Group Topic/Focus:  Recovery Goals:   The focus of this group is to identify appropriate goals for recovery and establish a plan to achieve them.  Participation Level:  Active  Participation Quality:  Appropriate, Sharing and Supportive  Affect:  Appropriate  Cognitive:  Appropriate  Insight: Appropriate  Engagement in Group:  Engaged  Modes of Intervention:  Discussion, Education, Socialization and Support  Additional Comments:  Pt stated that her short term goal is to go to an [place] meeting tomorrow night when she is discharged from the hospital. Pt stated that her long term goal is to stay involved with the AA.   [name] 12/22/2013, 1:34 PM

## 2013-12-23 DIAGNOSIS — F1020 Alcohol dependence, uncomplicated: Secondary | ICD-10-CM

## 2013-12-23 MED ORDER — LEVOTHYROXINE SODIUM 125 MCG PO TABS: 125.0000 ug | ORAL_TABLET | Freq: Every day | ORAL | Status: DC

## 2013-12-23 MED ORDER — FLUOXETINE HCL 40 MG PO CAPS: 40.0000 mg | ORAL_CAPSULE | Freq: Every day | ORAL | Status: DC

## 2013-12-23 MED ORDER — ARIPIPRAZOLE 5 MG PO TABS: 5.0000 mg | ORAL_TABLET | Freq: Every day | ORAL | Status: DC

## 2013-12-23 MED ORDER — BUPROPION HCL ER (XL) 300 MG PO TB24: 300.0000 mg | ORAL_TABLET | Freq: Every day | ORAL | Status: DC

## 2013-12-23 MED ORDER — OMEPRAZOLE 20 MG PO CPDR: 20.0000 mg | DELAYED_RELEASE_CAPSULE | Freq: Every day | ORAL | Status: DC

## 2013-12-23 MED ORDER — TRAZODONE HCL 100 MG PO TABS: 100.0000 mg | ORAL_TABLET | Freq: Every evening | ORAL | Status: DC | PRN

## 2013-12-23 MED ORDER — SULFAMETHOXAZOLE-TMP DS 800-160 MG PO TABS: 1.0000 | ORAL_TABLET | Freq: Two times a day (BID) | ORAL | Status: DC

## 2013-12-23 MED ORDER — GABAPENTIN 400 MG PO CAPS: 800.0000 mg | ORAL_CAPSULE | Freq: Three times a day (TID) | ORAL | Status: DC

## 2013-12-23 MED ORDER — GABAPENTIN 800 MG PO TABS
800.0000 mg | ORAL_TABLET | Freq: Three times a day (TID) | ORAL | Status: DC
  Filled 2013-12-23 (×3): qty 40

## 2013-12-23 NOTE — Progress Notes (Signed)
Patient ID: Jacqueline [name], female   DOB: 1966-09-18, 48 y.o.   MRN: [id number] She has been up and to group. Interacting with peers and staff. Self inventory: depression 3, hopelessness 0 , denies SI thoughts, withdrawal symptoms of agitation , chilling. Has requested and received prn for back pain that was helpful.

## 2013-12-23 NOTE — Progress Notes (Signed)
Patient ID: Jacqueline [name], female   DOB: 1966-10-07, 48 y.o.   MRN: [id number] She has been discharged home and was picked up by her daughter. She voiced understanding of discharge instruction and of the follow up plan. She denies thoughts of SI and all her belongings were taken home with her. Stated that she was going to the ED d/t her chronic back pain.

## 2013-12-23 NOTE — BHH Suicide Risk Assessment (Signed)
   Demographic Factors:  Adolescent or young adult, Caucasian, Low socioeconomic status and Living alone  Total Time spent with patient: 30 minutes  Psychiatric Specialty Exam: Physical Exam  ROS  Blood pressure 88/55, pulse 74, temperature 97.7 F (36.5 C), temperature source Oral, resp. rate 16, height 5' 9.5" (1.765 m), weight 86.183 kg (190 lb), last menstrual period 11/19/2013.Body mass index is 27.67 kg/(m^2).  General Appearance: Casual  Eye Contact::  Good  Speech:  Clear and Coherent  Volume:  Normal  Mood:  Anxious  Affect:  Appropriate and Congruent  Thought Process:  Coherent, Goal Directed and Linear  Orientation:  Full (Time, Place, and Person)  Thought Content:  WDL  Suicidal Thoughts:  No  Homicidal Thoughts:  No  Memory:  Immediate;   Good  Judgement:  Good  Insight:  Good  Psychomotor Activity:  Normal  Concentration:  Good  Recall:  Good  Fund of Knowledge:Good  Language: Good  Akathisia:  NA  Handed:  Right  AIMS (if indicated):     Assets:  Communication Skills Desire for Improvement Financial Resources/Insurance Housing Intimacy Leisure Time Resilience Social Support Talents/Skills Transportation  Sleep:  Number of Hours: 6.75    Musculoskeletal: Strength & Muscle Tone: within normal limits Gait & Station: normal Patient leans: Right   Mental Status Per Nursing Assessment::   On Admission:     Current Mental Status by Physician: NA  Loss Factors: Decrease in vocational status, Decline in physical health and Financial problems/change in socioeconomic status  Historical Factors: Family history of mental illness or substance abuse and Impulsivity  Risk Reduction Factors:   Sense of responsibility to family, Religious beliefs about death, Living with another person, especially a relative, Positive social support, Positive therapeutic relationship and Positive coping skills or problem solving skills  Continued Clinical Symptoms:   Depression:   Comorbid alcohol abuse/dependence Impulsivity Recent sense of peace/wellbeing Alcohol/Substance Abuse/Dependencies More than one psychiatric diagnosis Previous Psychiatric Diagnoses and Treatments Medical Diagnoses and Treatments/Surgeries  Cognitive Features That Contribute To Risk:  Polarized thinking    Suicide Risk:  Minimal: No identifiable suicidal ideation.  Patients presenting with no risk factors but with morbid ruminations; may be classified as minimal risk based on the severity of the depressive symptoms  Discharge Diagnoses:   AXIS I:  Substance Induced Mood Disorder and Alcohola dependence AXIS II:  Deferred AXIS III:   Past Medical History  Diagnosis Date  . Peptic ulcer   . Alcohol abuse   . Anemia   . Benzodiazepine abuse   . Thyroid disease   . Alcoholism   . Narcotic abuse   . Back pain   . Thrombocytopenia 06/17/2011  . Hypothyroidism   . Seizures   . Medical history non-contributory     hypoglycemic   AXIS IV:  economic problems, occupational problems, other psychosocial or environmental problems, problems related to social environment and problems with primary support group AXIS V:  61-70 mild symptoms  Plan Of Care/Follow-up recommendations:  Activity:  As tolerated Diet:  Regular  Is patient on multiple antipsychotic therapies at discharge:  No   Has Patient had three or more failed trials of antipsychotic monotherapy by history:  No  Recommended Plan for Multiple Antipsychotic Therapies: NA    Jacqueline [name],JANARDHAHA R. 12/23/2013, 11:01 AM

## 2013-12-23 NOTE — Discharge Summary (Signed)
Physician Discharge Summary Note  Patient:  Jacqueline [name] is an 48 y.o., female MRN:  [id number] DOB:  June 09, 1966 Patient phone:  [phone] (home)  Patient address:   [address] [place] [address],  Total Time spent with patient: Greater than 30 minutes  Date of Admission:  12/19/2013 Date of Discharge: 12/23/13  Reason for Admission: Alcohol dependence  Discharge Diagnoses: Principal Problem:   Alcohol dependence with intoxication Active Problems:   Alcohol dependence   Substance induced mood disorder   Unspecified episodic mood disorder   Moderate alcohol use disorder   Psychiatric Specialty Exam: Physical Exam  Constitutional: She is oriented to person, place, and time. She appears well-developed.  HENT:  Head: Normocephalic.  Eyes: Pupils are equal, round, and reactive to light.  Neck: Normal range of motion.  Cardiovascular: Normal rate.   Respiratory: Effort normal.  GI: Soft.  Genitourinary:  Denies any issues in this area  Musculoskeletal: Normal range of motion.  Neurological: She is alert and oriented to person, place, and time.  Skin: Skin is warm and dry.  Psychiatric: Her speech is normal and behavior is normal. Judgment and thought content normal. Her mood appears not anxious. Her affect is not angry, not blunt, not labile and not inappropriate. Cognition and memory are normal. She does not exhibit a depressed mood.    Review of Systems  Constitutional: Negative.   HENT: Negative.   Eyes: Negative.   Respiratory: Negative.   Cardiovascular: Negative.   Gastrointestinal: Negative.   Genitourinary: Negative.   Musculoskeletal: Negative.   Skin: Negative.   Neurological: Negative.   Endo/Heme/Allergies: Negative.   Psychiatric/Behavioral: Positive for depression (Stabilized with medication prior to discharge) and substance abuse (Alcohol dependence). Negative for suicidal ideas, hallucinations and memory loss. The patient has insomnia  (Stabilized with medication prior to discharge). The patient is not nervous/anxious.     Blood pressure 88/55, pulse 74, temperature 97.7 F (36.5 C), temperature source Oral, resp. rate 16, height 5' 9.5" (1.765 m), weight 86.183 kg (190 lb), last menstrual period 11/19/2013.Body mass index is 27.67 kg/(m^2).  General Appearance: Casual and Fairly Groomed  Eye Contact::  Good  Speech:  Clear and Coherent  Volume:  Normal  Mood:  Stable  Affect:  Appropriate and Congruent  Thought Process:  Coherent and Goal Directed  Orientation:  Full (Time, Place, and Person)  Thought Content:  Denies any psychotic symptoms  Suicidal Thoughts:  No  Homicidal Thoughts:  No  Memory:  Immediate;   Good Recent;   Good Remote;   Good  Judgement:  Fair  Insight:  Fairly present  Psychomotor Activity:  Normal  Concentration:  Good  Recall:  Good  Fund of Knowledge:Fair  Language: Good  Akathisia:  No  Handed:  Right  AIMS (if indicated):     Assets:  Desire for Improvement  Sleep:  Number of Hours: 6.75    Past Psychiatric History: Diagnosis: Alcohol Related Disorder - Severe (303.90), Substance induced mood disorder  Hospitalizations: [facility]  Outpatient Care: [facility]  Substance Abuse Care: [facility]  Self-Mutilation: NA  Suicidal Attempts: NA  Violent Behaviors: NA   Musculoskeletal: Strength & Muscle Tone: within normal limits Gait & Station: normal Patient leans: N/A  DSM5: Schizophrenia Disorders:  NA Obsessive-Compulsive Disorders:  NA Trauma-Stressor Disorders:  NA Substance/Addictive Disorders:  Alcohol Related Disorder - Severe (303.90) Depressive Disorders:  Substance induced mood disorder  Axis Diagnosis:  AXIS I:  Alcohol Related Disorder - Severe (  303.90), Substance induced mood disorder AXIS II:  Deferred AXIS III:   Past Medical History  Diagnosis Date  . Peptic ulcer   . Alcohol abuse   . Anemia   . Benzodiazepine abuse   . Thyroid disease   .  Alcoholism   . Narcotic abuse   . Back pain   . Thrombocytopenia 06/17/2011  . Hypothyroidism   . Seizures   . Medical history non-contributory     hypoglycemic   AXIS IV:  other psychosocial or environmental problems and Alcoholism, chronic AXIS V:  62  Level of Care:  OP  Hospital Course:  She denies use of other substances but her urine drug screen was positive for cocaine and benzos. Patient states "I relapsed. Been drinking beer for five weeks. About a case of beer a day. I have been depressed for a long time. I am having withdrawal symptoms of feeling shaky, nauseated, abdominal cramps and headache. I don't feel good."  Daizy was admitted to the hospital with blood alcohol level of 174 per toxicology reports and her UDS reports showed positive Benzodiazepine and Cocaine. She was intoxicated requiring detoxification treatment. She received Librium detox treatment. Oddie was also enrolled in group counseling sessions, AA/NA meetings being offered and held on this unit. She learned coping skills that should help her after discharge to cope better. She was also treated for her other mental health illnesses with Fluoxetine 40 mg daily for depression, Wellbutrin XL 300 mg daily for depression, Gabapentin 400 mg three times daily for substance withdrawal syndrome and Trazodone 100 mg Q bedtime for sleep.  Ally was resumed on all her pertinent home medications for her other medical issues including treatment for chronic constipation. She tolerated her treatment regimen without any significant adverse effects. She has completed detox treatment and her mood stabilized. This is evidenced by her reports of improved mood and absence of withdrawal symptoms. Yeimy is currently being discharged to follow-up care/substance abuse treatment at the the [facility] in [place], [place]. She has been provided with all the pertinent information required to make this appointment without problems. She  was provides with 2 weeks worth supply samples of her [facility] discharge medications.   Upon discharge, Marshawn adamntly denies any SIHI, AVH, delusions, paranoia and or withdrawal symptoms. She left [facility] with all personal belongings in no apparent distress. Transportation per daughter.  Consults:  psychiatry  Significant Diagnostic Studies:  labs: CBC with diff, CMP, UDS, toxicology tests, U/A  Discharge Vitals:   Blood pressure 88/55, pulse 74, temperature 97.7 F (36.5 C), temperature source Oral, resp. rate 16, height 5' 9.5" (1.765 m), weight 86.183 kg (190 lb), last menstrual period 11/19/2013. Body mass index is 27.67 kg/(m^2). Lab Results:   Results for orders placed during the hospital encounter of 12/19/13 (from the past 72 hour(s))  COMPREHENSIVE METABOLIC PANEL     Status: Abnormal   Collection Time    12/21/13  7:40 PM      Result Value Ref Range   Sodium 140  137 - 147 mEq/L   Potassium 4.5  3.7 - 5.3 mEq/L   Chloride 105  96 - 112 mEq/L   CO2 25  19 - 32 mEq/L   Glucose, Bld 82  70 - 99 mg/dL   BUN 10  6 - 23 mg/dL   Creatinine, Ser 1.11 (*) 0.50 - 1.10 mg/dL   Calcium 8.7  8.4 - 10.5 mg/dL   Total Protein 6.9  6.0 - 8.3 g/dL  Albumin 3.7  3.5 - 5.2 g/dL   AST 66 (*) 0 - 37 U/L   ALT 64 (*) 0 - 35 U/L   Alkaline Phosphatase 70  39 - 117 U/L   Total Bilirubin <0.2 (*) 0.3 - 1.2 mg/dL   GFR calc non Af Amer 59 (*) >90 mL/min   GFR calc Af Amer 68 (*) >90 mL/min   Comment: (NOTE)     The eGFR has been calculated using the CKD EPI equation.     This calculation has not been validated in all clinical situations.     eGFR's persistently <90 mL/min signify possible Chronic Kidney     Disease.     Performed at [facility]    Physical Findings: AIMS:  , ,  ,  ,    CIWA:  CIWA-Ar Total: 0 COWS:     Psychiatric Specialty Exam: See Psychiatric Specialty Exam and Suicide Risk Assessment completed by Attending Physician prior to discharge.  Discharge  destination:  Home  Is patient on multiple antipsychotic therapies at discharge:  No   Has Patient had three or more failed trials of antipsychotic monotherapy by history:  No  Recommended Plan for Multiple Antipsychotic Therapies: NA     Medication List    STOP taking these medications       KLONOPIN 1 MG tablet  Generic drug:  clonazePAM      TAKE these medications     Indication   ARIPiprazole 5 MG tablet  Commonly known as:  ABILIFY  Take 1 tablet (5 mg total) by mouth daily. Mood control   Indication:  Mood control     buPROPion 300 MG 24 hr tablet  Commonly known as:  WELLBUTRIN XL  Take 1 tablet (300 mg total) by mouth daily. For depression   Indication:  Major Depressive Disorder     FLUoxetine 40 MG capsule  Commonly known as:  PROZAC  Take 1 capsule (40 mg total) by mouth daily. For depression   Indication:  Major Depressive Disorder     gabapentin 400 MG capsule  Commonly known as:  NEURONTIN  Take 2 capsules (800 mg total) by mouth 3 (three) times daily. For substance withdrawal syndrome   Indication:  Neuropathic Pain, Pain, Substance withdrawal syndrome     levothyroxine 125 MCG tablet  Commonly known as:  SYNTHROID, LEVOTHROID  Take 1 tablet (125 mcg total) by mouth daily before breakfast. For thyroid hormone replacement   Indication:  Underactive Thyroid     omeprazole 20 MG capsule  Commonly known as:  PRILOSEC  Take 1 capsule (20 mg total) by mouth daily. For acid reflux   Indication:  Gastroesophageal Reflux Disease with Current Symptoms     sulfamethoxazole-trimethoprim 800-160 MG per tablet  Commonly known as:  BACTRIM DS  Take 1 tablet by mouth every 12 (twelve) hours. For infection   Indication:  Infection     traZODone 100 MG tablet  Commonly known as:  DESYREL  Take 1 tablet (100 mg total) by mouth at bedtime as needed for sleep.   Indication:  Trouble Sleeping       Follow-up Information   Follow up with [facility] On  12/28/2013. (Walk in between 10am-2am on this date for assessment for services (medication management/therapy))    Contact information:   [address] [place], [place] [address] Phone: [phone] Fax: [phone]     Follow-up recommendations:  Activity:  As tolerated Diet: As recommended by your primary care  doctor. Keep all scheduled follow-up appointments as recommended.  Comments:  Take all your medications as prescribed by your mental healthcare provider. Report any adverse effects and or reactions from your medicines to your outpatient provider promptly. Patient is instructed and cautioned to not engage in alcohol and or illegal drug use while on prescription medicines. In the event of worsening symptoms, patient is instructed to call the crisis hotline, 911 and or go to the nearest ED for appropriate evaluation and treatment of symptoms. Follow-up with your primary care provider for your other medical issues, concerns and or health care needs.   Total Discharge Time:  Greater than 30 minutes.  SignedEncarnacion Slates, PMHNP-BC 12/23/2013, 11:14 AM  Patient was seen face-to-face for psychiatric evaluation, suicide risk assessment and case discussed with the treatment team and physician extender. Disposition plan was made and reviewed the information documented and agree with the treatment plan.  Valincia Touch,JANARDHAHA R. 12/24/2013 12:46 PM

## 2013-12-23 NOTE — BHH Group Notes (Signed)
[facility] LCSW Aftercare Discharge Planning Group Note   12/23/2013 10:47 AM  Participation Quality:  Appropriate   Mood/Affect:  Flat  Depression Rating:  0  Anxiety Rating:  5-nervous about going back home today.   Thoughts of Suicide:  No Will you contract for safety?   NA  Current AVH:  No  Plan for Discharge/Comments:  Pt has followup scheduled at [facility] for med management and therapy. She plans to attend AA daily in [place] and live with her daughter.   Transportation Means: daughter coming at 12pm to pick up pt.   Supports: mother and daughters   Smart, Alicia Amel

## 2013-12-23 NOTE — Progress Notes (Signed)
[facility] Adult Case Management Discharge Plan :  Will you be returning to the same living situation after discharge: Yes,  home with daughter in [place]. At discharge, do you have transportation home?:Yes,  daughter coming at 12pm to transport pt home. Do you have the ability to pay for your medications:Yes,  mental health  Release of information consent forms completed and submitted to Medical Records by CSW.  Patient to Follow up at: Follow-up Information   Follow up with [facility] On 12/28/2013. (Walk in between 10am-2am on this date for assessment for services (medication management/therapy))    Contact information:   [address] [place], [place] [address] Phone: [phone] Fax: [phone]      Patient denies SI/HI:   Yes,  during admission, group, and self report.     Safety Planning and Suicide Prevention discussed:  Yes,  SPE not required for this pt. SPE completed with pt and pt provided with SPI pamphlet to share with her support network, Pt encouraged to ask questions and talk about any concerns relating to SPE.  Smart, Neema Barreira LCSWA  12/23/2013, 10:48 AM

## 2013-12-25 NOTE — Progress Notes (Signed)
Patient Discharge Instructions:  After Visit Summary (AVS):   Faxed to:  12/25/13 Discharge Summary Note:   Faxed to:  12/25/13 Psychiatric Admission Assessment Note:   Faxed to:  12/25/13 Suicide Risk Assessment - Discharge Assessment:   Faxed to:  12/25/13 Faxed/Sent to the Next Level Care provider:  12/25/13 Faxed to [facility], 12/25/2013, 4:20 PM

## 2013-12-29 ENCOUNTER — Telehealth: Payer: Self-pay | Admitting: Family Medicine

## 2013-12-29 NOTE — Telephone Encounter (Signed)
Patient has been dismissed from practice due to a bad debt in the past

## 2014-01-06 ENCOUNTER — Ambulatory Visit: Payer: Self-pay | Admitting: General Practice

## 2014-01-09 ENCOUNTER — Encounter (HOSPITAL_COMMUNITY): Payer: Self-pay | Admitting: Emergency Medicine

## 2014-01-09 ENCOUNTER — Emergency Department (HOSPITAL_COMMUNITY)
Admission: EM | Admit: 2014-01-09 | Discharge: 2014-01-09 | Disposition: A | Discharge status: Home / Self Care | Payer: Self-pay | Attending: Emergency Medicine | Admitting: Emergency Medicine

## 2014-01-09 DIAGNOSIS — F17200 Nicotine dependence, unspecified, uncomplicated: Secondary | ICD-10-CM | POA: Insufficient documentation

## 2014-01-09 DIAGNOSIS — F1410 Cocaine abuse, uncomplicated: Secondary | ICD-10-CM | POA: Insufficient documentation

## 2014-01-09 DIAGNOSIS — K279 Peptic ulcer, site unspecified, unspecified as acute or chronic, without hemorrhage or perforation: Secondary | ICD-10-CM | POA: Insufficient documentation

## 2014-01-09 DIAGNOSIS — F1910 Other psychoactive substance abuse, uncomplicated: Secondary | ICD-10-CM

## 2014-01-09 DIAGNOSIS — F1310 Sedative, hypnotic or anxiolytic abuse, uncomplicated: Secondary | ICD-10-CM | POA: Insufficient documentation

## 2014-01-09 DIAGNOSIS — Z862 Personal history of diseases of the blood and blood-forming organs and certain disorders involving the immune mechanism: Secondary | ICD-10-CM | POA: Insufficient documentation

## 2014-01-09 DIAGNOSIS — E039 Hypothyroidism, unspecified: Secondary | ICD-10-CM | POA: Insufficient documentation

## 2014-01-09 DIAGNOSIS — Z3202 Encounter for pregnancy test, result negative: Secondary | ICD-10-CM | POA: Insufficient documentation

## 2014-01-09 DIAGNOSIS — Z79899 Other long term (current) drug therapy: Secondary | ICD-10-CM | POA: Insufficient documentation

## 2014-01-09 LAB — ETHANOL: Alcohol, Ethyl (B): 230 mg/dL — ABNORMAL HIGH (ref 0–11)

## 2014-01-09 LAB — COMPREHENSIVE METABOLIC PANEL
ALBUMIN: 3.9 g/dL (ref 3.5–5.2)
ALT: 37 U/L — AB (ref 0–35)
AST: 45 U/L — ABNORMAL HIGH (ref 0–37)
Alkaline Phosphatase: 105 U/L (ref 39–117)
BUN: 6 mg/dL (ref 6–23)
CALCIUM: 9 mg/dL (ref 8.4–10.5)
CO2: 23 meq/L (ref 19–32)
CREATININE: 0.8 mg/dL (ref 0.50–1.10)
Chloride: 97 mEq/L (ref 96–112)
GFR calc Af Amer: >90 mL/min (ref >90)
GFR calc non Af Amer: 88 mL/min — ABNORMAL LOW (ref >90)
Glucose, Bld: 82 mg/dL (ref 70–99)
Potassium: 3.9 mEq/L (ref 3.7–5.3)
Sodium: 137 mEq/L (ref 137–147)
Total Bilirubin: 0.2 mg/dL — ABNORMAL LOW (ref 0.3–1.2)
Total Protein: 8.4 g/dL — ABNORMAL HIGH (ref 6.0–8.3)

## 2014-01-09 LAB — CBC WITH DIFFERENTIAL/PLATELET
BASOS ABS: 0.1 10*3/uL (ref 0.0–0.1)
Basophils Relative: 1 % (ref 0–1)
EOS PCT: 1 % (ref 0–5)
Eosinophils Absolute: 0 10*3/uL (ref 0.0–0.7)
HCT: 33.5 % — ABNORMAL LOW (ref 36.0–46.0)
Hemoglobin: 10.5 g/dL — ABNORMAL LOW (ref 12.0–15.0)
LYMPHS PCT: 18 % (ref 12–46)
Lymphs Abs: 1.1 10*3/uL (ref 0.7–4.0)
MCH: 25.8 pg — ABNORMAL LOW (ref 26.0–34.0)
MCHC: 31.3 g/dL (ref 30.0–36.0)
MCV: 82.3 fL (ref 78.0–100.0)
MONO ABS: 0.6 10*3/uL (ref 0.1–1.0)
Monocytes Relative: 10 % (ref 3–12)
NEUTROS ABS: 4.3 10*3/uL (ref 1.7–7.7)
Neutrophils Relative %: 71 % (ref 43–77)
PLATELETS: 359 10*3/uL (ref 150–400)
RBC: 4.07 MIL/uL (ref 3.87–5.11)
RDW: 17.3 % — ABNORMAL HIGH (ref 11.5–15.5)
WBC: 6 10*3/uL (ref 4.0–10.5)

## 2014-01-09 LAB — RAPID URINE DRUG SCREEN, HOSP PERFORMED
AMPHETAMINES: NOT DETECTED
BENZODIAZEPINES: NOT DETECTED
Barbiturates: NOT DETECTED
COCAINE: POSITIVE — AB
OPIATES: NOT DETECTED
TETRAHYDROCANNABINOL: NOT DETECTED

## 2014-01-09 LAB — URINE MICROSCOPIC-ADD ON

## 2014-01-09 LAB — URINALYSIS, ROUTINE W REFLEX MICROSCOPIC
Bilirubin Urine: NEGATIVE
GLUCOSE, UA: NEGATIVE mg/dL
Ketones, ur: NEGATIVE mg/dL
LEUKOCYTES UA: NEGATIVE
Nitrite: POSITIVE — AB
PH: 6 (ref 5.0–8.0)
Protein, ur: NEGATIVE mg/dL
Urobilinogen, UA: 0.2 mg/dL (ref 0.0–1.0)

## 2014-01-09 LAB — PREGNANCY, URINE: Preg Test, Ur: NEGATIVE

## 2014-01-09 MED ORDER — LORAZEPAM 1 MG PO TABS: 0.0000 mg | ORAL_TABLET | Freq: Two times a day (BID) | ORAL | Status: DC

## 2014-01-09 MED ORDER — LORAZEPAM 1 MG PO TABS
0.0000 mg | ORAL_TABLET | Freq: Four times a day (QID) | ORAL | Status: DC
  Administered 2014-01-09: 2 mg via ORAL
  Filled 2014-01-09: qty 2

## 2014-01-09 NOTE — Discharge Instructions (Signed)
°Emergency Department Resource Guide °1) Find a Doctor and Pay Out of Pocket °Although you won't have to find out who is covered by your insurance plan, it is a good idea to ask around and get recommendations. You will then need to call the office and see if the doctor you have chosen will accept you as a new patient and what types of options they offer for patients who are self-pay. Some doctors offer discounts or will set up payment plans for their patients who do not have insurance, but you will need to ask so you aren't surprised when you get to your appointment. ° °2) Contact Your Local Health Department °Not all health departments have doctors that can see patients for sick visits, but many do, so it is worth a call to see if yours does. If you don't know where your local health department is, you can check in your phone book. The CDC also has a tool to help you locate your state's health department, and many state websites also have listings of all of their local health departments. ° °3) Find a Walk-in Clinic °If your illness is not likely to be very severe or complicated, you may want to try a walk in clinic. These are popping up all over the country in pharmacies, drugstores, and shopping centers. They're usually staffed by nurse practitioners or physician assistants that have been trained to treat common illnesses and complaints. They're usually fairly quick and inexpensive. However, if you have serious medical issues or chronic medical problems, these are probably not your best option. ° °No Primary Care Doctor: °- Call Health Connect at  832-8000 - they can help you locate a primary care doctor that  accepts your insurance, provides certain services, etc. °- Physician Referral Service- 1-800-533-3463 ° °Chronic Pain Problems: °Organization         Address  Phone   Notes  °[facility]  (336) 297-2271 Patients need to be referred by their primary care doctor.  ° °Medication  Assistance: °Organization         Address  Phone   Notes  °[place] [facility] [address] °[place], [place] [address] (336) 641-8030 --Must be a resident of [place] °-- Must have NO insurance coverage whatsoever (no Medicaid/ Medicare, etc.) °-- The pt. MUST have a primary care doctor that directs their care regularly and follows them in the community °  °[facility]  (866) 331-1348   °[employer] Way  (888) 892-1162   ° °Agencies that provide inexpensive medical care: °Organization         Address  Phone   Notes  °[facility]  (336) 832-8035   °[facility]    (336) 832-7272   °[facility] [address] °[place], [place] [address] (336) 832-4777   °[facility] [address], °[place] (336) 271-4999   °[facility]    (336) 373-0678   °[facility]    (336) 272-1050   °[facility] ° [address], [place] Phone:  (336) 832-4444, Fax:  (336) 832-4440 Hours of Operation:  9 am - 6 pm, M-F.  Also accepts Medicaid/Medicare and self-pay.  °[facility] ° [address], [place] Phone: (336) 832-3150, Fax: (336) 832-3151. Hours of Operation:  8:30 am - 5:30 pm, M-F.  Also accepts Medicaid and self-pay.  °[facility] [place] [address], [place] Phone: (336) 878-6027   °[facility] [address], [place], [place] (336)723-1848, Ext. 123 Mondays & Thursdays: 7-9 AM.  First 15 patients are seen on a first come, first serve basis. °  ° °Medicaid-accepting [place] Providers: ° °Organization         Address  Phone   Notes  °[facility] [address], [place] (336) 641-2100 Also accepts self-pay patients.  °[facility] [address], [place] ° (336) 856-9996   °[facility] [address], [place]  (336) 288-8857   °[facility] [address] [place] [address], [place] (336) 299-7000   °Veita Bland [address], [place]  ° (336) 373-1557 Only accepts [place] Access Medicaid patients after they have their name applied to their card.  ° °Self-Pay (no insurance) in [place]: ° °Organization         Address  Phone   Notes  °Sickle Cell Patients, [facility] [address], [place] (336) 832-1970   °[facility] [address], [place] (336) 832-4400   °[facility] ° [address], [place] (336) 992-4800   °[facility]/Dr. Osei-Bonsu ° [address] [place] [address], [place] or [address], [place] (336) 841-8500 Phone number for both [place] and [place] locations is the same.  °[facility] [address], [place] (336) 299-0000   °[facility] [address] [place] [address], [place] or [address] (336) 852-7530 °(336) 878-2260   °[facility] [address], [place] (336) 350-1642, phone; (336) 294-5005, fax Sees patients 1st and 3rd Saturday of every month.  Must not qualify for public or private insurance (i.e. Medicaid, Medicare, [place] Health Choice, Veterans' Benefits) • Household income should be no more than 200% of the poverty level •The clinic cannot treat you if you are pregnant or think you are pregnant • Sexually transmitted diseases are not treated at the clinic.  ° ° °Dental Care: °Organization         Address  Phone  Notes  °[place] [facility] [address], [place] (336) 641-6152 Accepts children up to age 21 who are enrolled in Medicaid or [place] Health Choice; pregnant women with a Medicaid card; and children who have applied for Medicaid or [place] Health Choice, but were declined, whose parents can pay a reduced fee at time of service.  °[place]  [facility] [place]  [address], [place] (336) 641-7733 Accepts children up to age 21 who are enrolled in Medicaid or  Health Choice; pregnant women with a Medicaid card; and children who have applied for Medicaid or  Health Choice, but were declined, whose parents can pay a reduced fee at time of service.  °[facility] ° [address], [place] (336) 641-4533 Patients are seen by appointment only. Walk-ins are not accepted. [facility] will see patients 18 years of age and older. °Monday - Tuesday (8am-5pm) °Most Wednesdays (8:30-5pm) °$30 per visit, cash only  °[facility] ° [address], [place] (336) 641-4533 Patients are seen by appointment only. Walk-ins are not accepted. [facility] will see patients 18 years of age and older. °One   Wednesday Evening (Monthly: Volunteer Based).  $30 per visit, cash only  °[facility]  (919) 537-3737 for adults; Children under age 4, call Graduate Pediatric Dentistry at (919) [phone]. Children aged 4-14, please call (919) [phone] to request a pediatric application. ° Dental services are provided in all areas of dental care including fillings, crowns and bridges, complete and partial dentures, implants, gum treatment, root canals, and extractions. Preventive care is also provided. Treatment is provided to both adults and children. °Patients are selected via a lottery and there is often a waiting list. °  °[facility] [address], °[place] ° (336) 763-8833 [email or web address] °  °[facility] [address], [place], [place] (336)723-1848, Ext. 123 Second and Fourth Thursday of each month, opens at 6:30 AM; Clinic ends at 9 AM.  Patients are seen on a first-come first-served basis, and a limited number are seen during each clinic.  ° °[facility] ° [address], [place], [place] (336) 723-7904    Eligibility Requirements °You must have lived in [place], [place], or [place] counties for at least the last three months. °  You cannot be eligible for state or federal sponsored healthcare insurance, including Veterans Administration, Medicaid, or Medicare. °  You generally cannot be eligible for healthcare insurance through your employer.  °  How to apply: °Eligibility screenings are held every Tuesday and Wednesday afternoon from 1:00 pm until 4:00 pm. You do not need an appointment for the interview!  °[facility] [address], [place], [place] 336-631-2330   °[place] [facility]  336-342-8273   °[place]  336-703-3100   °[facility]  336-570-6415   ° °Behavioral Health Resources in the Community: °Intensive Outpatient Programs °Organization         Address  Phone  Notes  °[facility] [address], [place], [place] 336-878-6098   °[facility] [address], [place], [place] 336-832-9800   °[facility] [address], [place], [place] ° 336-882-2125   °[facility] [address],  °[place], [place] 1-800-853-5163 or 336-641-4981   °Substance Abuse Resources °Organization         Address  Phone  Notes  °[facility]  336-882-2125   °[facility]  336-784-9470   °[facility]  336-285-9073   °[facility]  336-845-3988   °Residential & Outpatient Substance Abuse Program  1-800-659-3381   °Psychological Services °Organization         Address  Phone  Notes  °[facility]  336- 832-9600   °[facility]  336- 378-7881   °[facility] [address], [place] 1-800-853-5163 or 336-641-4981   ° °Mobile Crisis Teams °Organization         Address  Phone  Notes  °[facility], Mobile Crisis Care Unit  1-877-626-1772   °Assertive °[facility] ° [address]  [place], [place] 336-834-9664   °Sharon DeEsch [address] °[place] 336-554-5454   ° °Self-Help/Support Groups °Organization         Address  Phone             Notes  °[facility] - variety of support groups  336- 373-1402 Call for more information  °Narcotics Anonymous (NA), [facility] [address], °[place]  2 meetings at this location  ° °  Residential Treatment Programs °Organization         Address  Phone  Notes  °[facility] [address],    °[place]  1-866-801-8205   °[facility] ° [address], [place], [place] 704-293-8524   °[facility] [address], [place] 336-845-3988 Admissions: 8am-3pm M-F  °[facility] [address]    °[place], [place] 336-841-1104   °[facility] [address], [place], [place] 336-379-7146   °[facility] [address]  °[place], [place] 336-285-9073   °[facility] Intensive Outpatient [address], [place], [place] 336-852-3033   °[facility] [address]  °[place], [place] 1-877-615-2722 or 336-784-9470   °[facility] [address] [place], [place] 336-227-7417 Accepts Medicaid  °[facility] [address]  ° [place] 1-800-659-3381 Substance Abuse/Addiction Treatment  ° °[place] Behavioral Health Resources °Organization         Address  Phone  Notes  °[facility]  (888) 581-9988   °Julie Brannon, PhD [address] [place], [place]   (336) 349-5553 or (336) 951-0000   °[facility]   [address] °[place], [place] (336) 349-4454   °[facility] [address], [place], [place] (336) 342-8316 Insurance/Medicaid/sponsorship through [facility]  °[facility] [address]                                    [place], [place] (336) 342-8316 Therapy/tele-psych/case    °[facility] [address]  ° [place], [place] (336) 349-2233    °Dr. Arfeen  (336) 349-4544   °[facility] [place]  [employer] [place] [facility] 1) [address], [place] °2) [address], [place] °3)  [address], [place] (336) 349-3220 °(336) 342-7768 ° °(336) 342-8140   °[place] [facility] (336) 342-1394 or (336) 342-3537 (After Hours)    ° ° °

## 2014-01-09 NOTE — ED Provider Notes (Signed)
I spoke to representatives at [facility] She is not accepted at their facility but would seek outside placement Pt is currently clinically sober She is ambulatory in the ED without assistance She was just at [facility] last month.  I don't see signs of acute withdrawal at this time She does not voice SI Given outpatient resources for followup She reports she has ride home from ER BP 129/77  Pulse 106  Temp(Src) 98.8 F (37.1 C) (Oral)  Resp 22  SpO2 100%  LMP 12/17/2013   Sharyon Cable, MD 01/09/14 [phone]
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
## 2014-01-09 NOTE — ED Notes (Signed)
Per BH:  Pt will need inpatient treatment for detox, she will not be able to go to [facility] and other placement will be looked for [facility] assessment will be working on placement for her).  EDP notified

## 2014-01-09 NOTE — ED Notes (Signed)
Pt presents to ED with request for detox from alcohol. Pt states her last drink was this morning.

## 2014-01-09 NOTE — Progress Notes (Signed)
Pt's referral has been sent to the following for inpt detox tx:  [facility] female beds available, referral faxed for review [facility]- referral faxed for review   [facility] Disposition [facility]

## 2014-01-09 NOTE — ED Notes (Signed)
Dr. Christy Gentles reports he told pt to follow up with [facility].  Pt did not wait for written d/c instructions.

## 2014-01-09 NOTE — ED Notes (Signed)
Pt states she has been stressed recently but denies SI/HI.

## 2014-01-09 NOTE — ED Provider Notes (Signed)
LEVEL 5 CAVEAT CSN: [id number]     Arrival date & time 01/09/14  1151 History  This chart was scribed for [employer]. Alvino Chapel, MD,  by Stacy Gardner, ED Scribe. The patient was seen in room APA17/APA17 and the patient's care was started at 1:05 PM.    First MD Initiated Contact with Patient 01/09/14 1249     Chief Complaint  Patient presents with  . V70.1    Level V caveat due to intoxication and uncooperativeness. (Consider location/radiation/quality/duration/timing/severity/associated sxs/prior Treatment) HPI HPI Comments: Jacqueline [name] is a 48 y.o. female who presents to the Emergency Department for detox from alcohol. Pt reports using atrivan, but states "it's not that much". Pt reports using cocaine and her last use was today. Pt  drinks 1.5 cases of beer a day. She denies SI and HI. Denies any known liver complications.    Past Medical History  Diagnosis Date  . Peptic ulcer   . Alcohol abuse   . Anemia   . Benzodiazepine abuse   . Thyroid disease   . Alcoholism   . Narcotic abuse   . Back pain   . Thrombocytopenia 06/17/2011  . Hypothyroidism   . Seizures   . Medical history non-contributory     hypoglycemic   Past Surgical History  Procedure Laterality Date  . Cholecystectomy    . Abdominal surgery    . Esophagogastroduodenoscopy    . Gastric bypass    . Tubal ligation    . Esophagogastroduodenoscopy N/A 03/23/2013    Procedure: ESOPHAGOGASTRODUODENOSCOPY (EGD);  Surgeon: Ladene Artist, MD;  Location: [facility];  Service: Endoscopy;  Laterality: N/A;  . No past surgeries      gastric bypass   Family History  Problem Relation Age of Onset  . Alcohol abuse Father   . Alcoholism Father   . Cancer Other    History  Substance Use Topics  . Smoking status: Current Every Day Smoker -- 1.00 packs/day for 12 years    Types: Cigarettes    Last Attempt to Quit: 08/08/2011  . Smokeless tobacco: Never Used  . Alcohol Use: Yes     Comment: drink 4 liters  a day. 1 case/day drinks Listerine   OB History   Grav Para Term Preterm Abortions TAB SAB Ect Mult Living   3 3  3      3      Review of Systems  Unable to perform ROS: Other      Allergies  Nsaids  Home Medications   Current Outpatient Rx  Name  Route  Sig  Dispense  Refill  . ARIPiprazole (ABILIFY) 5 MG tablet   Oral   Take 1 tablet (5 mg total) by mouth daily. Mood control   30 tablet   0   . buPROPion (WELLBUTRIN XL) 300 MG 24 hr tablet   Oral   Take 1 tablet (300 mg total) by mouth daily. For depression   30 tablet   0   . FLUoxetine (PROZAC) 40 MG capsule   Oral   Take 1 capsule (40 mg total) by mouth daily. For depression   30 capsule   0   . gabapentin (NEURONTIN) 400 MG capsule   Oral   Take 2 capsules (800 mg total) by mouth 3 (three) times daily. For substance withdrawal syndrome   180 capsule   0   . levothyroxine (SYNTHROID, LEVOTHROID) 125 MCG tablet   Oral   Take 1 tablet (125 mcg total) by mouth daily  before breakfast. For thyroid hormone replacement   30 tablet   1   . omeprazole (PRILOSEC) 20 MG capsule   Oral   Take 1 capsule (20 mg total) by mouth daily. For acid reflux         . sulfamethoxazole-trimethoprim (BACTRIM DS) 800-160 MG per tablet   Oral   Take 1 tablet by mouth every 12 (twelve) hours. For infection         . traZODone (DESYREL) 100 MG tablet   Oral   Take 1 tablet (100 mg total) by mouth at bedtime as needed for sleep.   30 tablet   0    BP 110/60  Pulse 76  Temp(Src) 98.8 F (37.1 C) (Oral)  Resp 16  SpO2 96%  LMP 12/17/2013 Physical Exam  Nursing note and vitals reviewed. Constitutional: She appears well-developed and well-nourished.   somewhat uncoorperative.   HENT:  Head: Normocephalic and atraumatic.  Eyes:  Dilated pupils bilaterally, but somewhat reactive  Neck: Normal range of motion.  Cardiovascular: Normal rate.   Pulmonary/Chest: Effort normal.  Abdominal: Soft. She exhibits no  distension. There is no tenderness.  Musculoskeletal: Normal range of motion.  Neurological: She is alert.  Patient is somewhat uncooperative. She is moving all extremities  Skin: Skin is warm and dry.  Psychiatric:  Patient is uncooperative and somewhat sedate    ED Course  Procedures (including critical care time) DIAGNOSTIC STUDIES: Oxygen Saturation is 96% on room air, normal by my interpretation.    COORDINATION OF CARE:  1:09 PM Discussed course of care with pt . Pt understands and agrees.    Labs Review Labs Reviewed - No data to display Imaging Review No results found.   EKG Interpretation None      MDM   Final diagnoses:  None    Patient with substance abuse. Somewhat uncooperative. Urinalysis is is improved from prior. There are a few white cells and few bacteria. Culture is consensually to get followed. She appears to be medically cleared at this time. TTS has been consulted. She denies suicidal or homicidal thoughts, although she is uncooperative with me.   I personally performed the services described in this documentation, which was scribed in my presence. The recorded information has been reviewed and is accurate.      Jasper Riling. Alvino Chapel, MD 01/09/14 1524

## 2014-01-10 ENCOUNTER — Emergency Department (HOSPITAL_COMMUNITY)
Admission: EM | Admit: 2014-01-10 | Discharge: 2014-01-10 | Disposition: A | Discharge status: Rehabilitation Facility | Payer: Self-pay | Attending: Emergency Medicine | Admitting: Emergency Medicine

## 2014-01-10 ENCOUNTER — Encounter (HOSPITAL_COMMUNITY): Payer: Self-pay | Admitting: Emergency Medicine

## 2014-01-10 DIAGNOSIS — F1910 Other psychoactive substance abuse, uncomplicated: Secondary | ICD-10-CM

## 2014-01-10 DIAGNOSIS — G40909 Epilepsy, unspecified, not intractable, without status epilepticus: Secondary | ICD-10-CM | POA: Insufficient documentation

## 2014-01-10 DIAGNOSIS — E079 Disorder of thyroid, unspecified: Secondary | ICD-10-CM | POA: Insufficient documentation

## 2014-01-10 DIAGNOSIS — F1010 Alcohol abuse, uncomplicated: Secondary | ICD-10-CM

## 2014-01-10 DIAGNOSIS — Z862 Personal history of diseases of the blood and blood-forming organs and certain disorders involving the immune mechanism: Secondary | ICD-10-CM | POA: Insufficient documentation

## 2014-01-10 DIAGNOSIS — Z79899 Other long term (current) drug therapy: Secondary | ICD-10-CM | POA: Insufficient documentation

## 2014-01-10 DIAGNOSIS — E039 Hypothyroidism, unspecified: Secondary | ICD-10-CM | POA: Insufficient documentation

## 2014-01-10 DIAGNOSIS — F1020 Alcohol dependence, uncomplicated: Secondary | ICD-10-CM | POA: Insufficient documentation

## 2014-01-10 DIAGNOSIS — Z8711 Personal history of peptic ulcer disease: Secondary | ICD-10-CM | POA: Insufficient documentation

## 2014-01-10 DIAGNOSIS — F17200 Nicotine dependence, unspecified, uncomplicated: Secondary | ICD-10-CM | POA: Insufficient documentation

## 2014-01-10 DIAGNOSIS — Z792 Long term (current) use of antibiotics: Secondary | ICD-10-CM | POA: Insufficient documentation

## 2014-01-10 DIAGNOSIS — F1410 Cocaine abuse, uncomplicated: Secondary | ICD-10-CM | POA: Insufficient documentation

## 2014-01-10 LAB — RAPID URINE DRUG SCREEN, HOSP PERFORMED
AMPHETAMINES: NOT DETECTED
BARBITURATES: NOT DETECTED
Benzodiazepines: NOT DETECTED
Cocaine: POSITIVE — AB
OPIATES: NOT DETECTED
Tetrahydrocannabinol: NOT DETECTED

## 2014-01-10 LAB — ETHANOL: Alcohol, Ethyl (B): 149 mg/dL — ABNORMAL HIGH (ref 0–11)

## 2014-01-10 MED ORDER — LORAZEPAM 1 MG PO TABS
1.0000 mg | ORAL_TABLET | Freq: Once | ORAL | Status: AC
  Administered 2014-01-10: 1 mg via ORAL
  Filled 2014-01-10: qty 1

## 2014-01-10 MED ORDER — ONDANSETRON 4 MG PO TBDP
8.0000 mg | ORAL_TABLET | Freq: Once | ORAL | Status: AC
  Administered 2014-01-10: 8 mg via ORAL
  Filled 2014-01-10: qty 2

## 2014-01-10 NOTE — ED Provider Notes (Signed)
CSN: [id number]     Arrival date & time 01/10/14  1041 History   First MD Initiated Contact with Patient 01/10/14 1047     Chief Complaint  Patient presents with  . Medical Clearance     (Consider location/radiation/quality/duration/timing/severity/associated sxs/prior Treatment) HPI 48 year old female with history of alcohol abuse benzodiazepine abuse cocaine abuse polysubstance abuse narcotic abuse has relapsed after detox multiple times was seen in the ED yesterday and discharged with referrals; used alcohol overnight and states she spoke with [facility] today and states they have a bed for her; no suicidal ideation no homicidal ideation no hallucinations no withdrawal symptoms at this time no fever no trauma no chest pain no shortness breath no abdominal pain; she has chronic stable elevation of liver enzymes chronic stable anemia mild noted in yesterday's labs negative pregnancy test yesterday. Past Medical History  Diagnosis Date  . Peptic ulcer   . Alcohol abuse   . Anemia   . Benzodiazepine abuse   . Thyroid disease   . Alcoholism   . Narcotic abuse   . Back pain   . Thrombocytopenia 06/17/2011  . Hypothyroidism   . Seizures   . Medical history non-contributory     hypoglycemic   Past Surgical History  Procedure Laterality Date  . Cholecystectomy    . Abdominal surgery    . Esophagogastroduodenoscopy    . Gastric bypass    . Tubal ligation    . Esophagogastroduodenoscopy N/A 03/23/2013    Procedure: ESOPHAGOGASTRODUODENOSCOPY (EGD);  Surgeon: Ladene Artist, MD;  Location: [facility];  Service: Endoscopy;  Laterality: N/A;  . No past surgeries      gastric bypass   Family History  Problem Relation Age of Onset  . Alcohol abuse Father   . Alcoholism Father   . Cancer Other    History  Substance Use Topics  . Smoking status: Current Every Day Smoker -- 1.00 packs/day for 12 years    Types: Cigarettes    Last Attempt to Quit: 08/08/2011  . Smokeless tobacco: Never  Used  . Alcohol Use: Yes     Comment: drink 4 liters a day. 1 case/day drinks Listerine   OB History   Grav Para Term Preterm Abortions TAB SAB Ect Mult Living   3 3  3      3      Review of Systems 10 Systems reviewed and are negative for acute change except as noted in the HPI.   Allergies  Nsaids  Home Medications   Current Outpatient Rx  Name  Route  Sig  Dispense  Refill  . ARIPiprazole (ABILIFY) 5 MG tablet   Oral   Take 1 tablet (5 mg total) by mouth daily. Mood control   30 tablet   0   . buPROPion (WELLBUTRIN XL) 300 MG 24 hr tablet   Oral   Take 1 tablet (300 mg total) by mouth daily. For depression   30 tablet   0   . FLUoxetine (PROZAC) 40 MG capsule   Oral   Take 1 capsule (40 mg total) by mouth daily. For depression   30 capsule   0   . gabapentin (NEURONTIN) 400 MG capsule   Oral   Take 2 capsules (800 mg total) by mouth 3 (three) times daily. For substance withdrawal syndrome   180 capsule   0   . levothyroxine (SYNTHROID, LEVOTHROID) 125 MCG tablet   Oral   Take 1 tablet (125 mcg total) by mouth daily before breakfast.  For thyroid hormone replacement   30 tablet   1   . omeprazole (PRILOSEC) 20 MG capsule   Oral   Take 1 capsule (20 mg total) by mouth daily. For acid reflux         . sulfamethoxazole-trimethoprim (BACTRIM DS) 800-160 MG per tablet   Oral   Take 1 tablet by mouth every 12 (twelve) hours. For infection         . traZODone (DESYREL) 100 MG tablet   Oral   Take 1 tablet (100 mg total) by mouth at bedtime as needed for sleep.   30 tablet   0    BP 121/74  Pulse 90  Resp 20  SpO2 100%  LMP 01/10/2014 Physical Exam  Nursing note and vitals reviewed. Constitutional:  Awake, alert, nontoxic appearance.  HENT:  Head: Atraumatic.  Eyes: Right eye exhibits no discharge. Left eye exhibits no discharge.  Neck: Neck supple.  Cardiovascular: Normal rate and regular rhythm.   No murmur heard. Pulmonary/Chest: Effort  normal and breath sounds normal. No respiratory distress. She has no wheezes. She has no rales. She exhibits no tenderness.  Abdominal: Soft. Bowel sounds are normal. She exhibits no distension and no mass. There is no tenderness. There is no rebound and no guarding.  Musculoskeletal: She exhibits no tenderness.  Baseline ROM, no obvious new focal weakness.  Neurological: She is alert.  Mental status and motor strength appears baseline for patient and situation.  Skin: No rash noted.  Psychiatric: She has a normal mood and affect.    ED Course  Procedures (including critical care time) D/w [facility] who may have bed faxing labs from last ED visit. 1100 [facility] has bed. 1210 Patient informed of clinical course, understand medical decision-making process, and agree with plan. Labs Review Labs Reviewed  ETHANOL - Abnormal; Notable for the following:    Alcohol, Ethyl (B) 149 (*)    All other components within normal limits  URINE RAPID DRUG SCREEN (HOSP PERFORMED) - Abnormal; Notable for the following:    Cocaine POSITIVE (*)    All other components within normal limits   Imaging Review No results found.   EKG Interpretation None      MDM   Final diagnoses:  Alcohol abuse  Alcoholism  Polysubstance abuse    The patient appears reasonably stabilized for transfer considering the current resources, flow, and capabilities available in the ED at this time, and I doubt any other [facility] requiring further screening and/or treatment in the ED prior to transfer.    Babette Relic, MD 01/14/14 [phone]

## 2014-01-10 NOTE — ED Notes (Signed)
Requested info faxed to [facility]

## 2014-01-10 NOTE — ED Notes (Signed)
Pt here for detox from etoh.  Pt reports drinking a case and half of beer a day.  Last drink was "an hour or two ago."  Pt spoke with [facility], was prescreened, and was told there are beds available at this time. Pt in NAD, A&O.

## 2014-01-10 NOTE — ED Notes (Signed)
Spoke to [place] at [facility]. She advises that transportation will be here around 2 pm to pick up patient and take her to [facility]

## 2014-01-10 NOTE — ED Notes (Signed)
Orders received from dr bednar to cover pt ciwa

## 2014-01-11 LAB — URINE CULTURE

## 2014-02-03 ENCOUNTER — Emergency Department (HOSPITAL_COMMUNITY)
Admission: EM | Admit: 2014-02-03 | Discharge: 2014-02-04 | Disposition: A | Discharge status: Other Acute Inpatient Hospital | Payer: No Typology Code available for payment source | Attending: Emergency Medicine | Admitting: Emergency Medicine

## 2014-02-03 ENCOUNTER — Emergency Department (HOSPITAL_COMMUNITY): Payer: Self-pay

## 2014-02-03 ENCOUNTER — Encounter (HOSPITAL_COMMUNITY): Payer: Self-pay | Admitting: Emergency Medicine

## 2014-02-03 DIAGNOSIS — F17200 Nicotine dependence, unspecified, uncomplicated: Secondary | ICD-10-CM | POA: Insufficient documentation

## 2014-02-03 DIAGNOSIS — Z792 Long term (current) use of antibiotics: Secondary | ICD-10-CM | POA: Insufficient documentation

## 2014-02-03 DIAGNOSIS — Z3202 Encounter for pregnancy test, result negative: Secondary | ICD-10-CM | POA: Insufficient documentation

## 2014-02-03 DIAGNOSIS — Z79899 Other long term (current) drug therapy: Secondary | ICD-10-CM | POA: Insufficient documentation

## 2014-02-03 DIAGNOSIS — F1410 Cocaine abuse, uncomplicated: Secondary | ICD-10-CM | POA: Diagnosis present

## 2014-02-03 DIAGNOSIS — Z862 Personal history of diseases of the blood and blood-forming organs and certain disorders involving the immune mechanism: Secondary | ICD-10-CM | POA: Insufficient documentation

## 2014-02-03 DIAGNOSIS — E039 Hypothyroidism, unspecified: Secondary | ICD-10-CM | POA: Insufficient documentation

## 2014-02-03 DIAGNOSIS — G40909 Epilepsy, unspecified, not intractable, without status epilepticus: Secondary | ICD-10-CM | POA: Insufficient documentation

## 2014-02-03 DIAGNOSIS — R4182 Altered mental status, unspecified: Secondary | ICD-10-CM | POA: Insufficient documentation

## 2014-02-03 DIAGNOSIS — F10229 Alcohol dependence with intoxication, unspecified: Secondary | ICD-10-CM | POA: Diagnosis present

## 2014-02-03 DIAGNOSIS — Z8711 Personal history of peptic ulcer disease: Secondary | ICD-10-CM | POA: Insufficient documentation

## 2014-02-03 DIAGNOSIS — F1910 Other psychoactive substance abuse, uncomplicated: Secondary | ICD-10-CM

## 2014-02-03 DIAGNOSIS — F1020 Alcohol dependence, uncomplicated: Secondary | ICD-10-CM | POA: Insufficient documentation

## 2014-02-03 LAB — CBC WITH DIFFERENTIAL/PLATELET
BASOS PCT: 1 % (ref 0–1)
Basophils Absolute: 0.1 10*3/uL (ref 0.0–0.1)
EOS ABS: 0 10*3/uL (ref 0.0–0.7)
EOS PCT: 1 % (ref 0–5)
HEMATOCRIT: 33.3 % — AB (ref 36.0–46.0)
Hemoglobin: 10.1 g/dL — ABNORMAL LOW (ref 12.0–15.0)
Lymphocytes Relative: 12 % (ref 12–46)
Lymphs Abs: 1 10*3/uL (ref 0.7–4.0)
MCH: 24.8 pg — ABNORMAL LOW (ref 26.0–34.0)
MCHC: 30.3 g/dL (ref 30.0–36.0)
MCV: 81.6 fL (ref 78.0–100.0)
MONO ABS: 0.6 10*3/uL (ref 0.1–1.0)
Monocytes Relative: 6 % (ref 3–12)
Neutro Abs: 7.1 10*3/uL (ref 1.7–7.7)
Neutrophils Relative %: 81 % — ABNORMAL HIGH (ref 43–77)
Platelets: 420 10*3/uL — ABNORMAL HIGH (ref 150–400)
RBC: 4.08 MIL/uL (ref 3.87–5.11)
RDW: 18.2 % — ABNORMAL HIGH (ref 11.5–15.5)
WBC: 8.8 10*3/uL (ref 4.0–10.5)

## 2014-02-03 LAB — COMPREHENSIVE METABOLIC PANEL
ALBUMIN: 3.8 g/dL (ref 3.5–5.2)
ALT: 27 U/L (ref 0–35)
AST: 39 U/L — AB (ref 0–37)
Alkaline Phosphatase: 87 U/L (ref 39–117)
BUN: 9 mg/dL (ref 6–23)
CALCIUM: 8.5 mg/dL (ref 8.4–10.5)
CO2: 24 mEq/L (ref 19–32)
CREATININE: 0.82 mg/dL (ref 0.50–1.10)
Chloride: 96 mEq/L (ref 96–112)
GFR calc Af Amer: >90 mL/min (ref >90)
GFR, EST NON AFRICAN AMERICAN: 85 mL/min — AB (ref >90)
Glucose, Bld: 103 mg/dL — ABNORMAL HIGH (ref 70–99)
Potassium: 3.8 mEq/L (ref 3.7–5.3)
SODIUM: 139 meq/L (ref 137–147)
TOTAL PROTEIN: 7.6 g/dL (ref 6.0–8.3)
Total Bilirubin: <0.2 mg/dL — ABNORMAL LOW (ref 0.3–1.2)

## 2014-02-03 LAB — ACETAMINOPHEN LEVEL: Acetaminophen (Tylenol), Serum: <15 ug/mL (ref 10–30)

## 2014-02-03 LAB — ETHANOL: ALCOHOL ETHYL (B): 400 mg/dL — AB (ref 0–11)

## 2014-02-03 LAB — SALICYLATE LEVEL

## 2014-02-03 MED ORDER — VITAMIN B-1 100 MG PO TABS
100.0000 mg | ORAL_TABLET | Freq: Every day | ORAL | Status: DC
  Administered 2014-02-03 – 2014-02-04 (×2): 100 mg via ORAL
  Filled 2014-02-03 (×2): qty 1

## 2014-02-03 MED ORDER — LORAZEPAM 1 MG PO TABS
0.0000 mg | ORAL_TABLET | Freq: Two times a day (BID) | ORAL | Status: DC
Start: 2014-02-05 — End: 2014-02-04

## 2014-02-03 MED ORDER — LORAZEPAM 1 MG PO TABS
0.0000 mg | ORAL_TABLET | Freq: Four times a day (QID) | ORAL | Status: DC
  Administered 2014-02-03: 1 mg via ORAL
  Administered 2014-02-04: 2 mg via ORAL
  Administered 2014-02-04: 3 mg via ORAL
  Administered 2014-02-04: 2 mg via ORAL
  Filled 2014-02-03 (×4): qty 2

## 2014-02-03 MED ORDER — THIAMINE HCL 100 MG/ML IJ SOLN: 100.0000 mg | Freq: Every day | INTRAMUSCULAR | Status: DC

## 2014-02-03 NOTE — ED Notes (Signed)
Pt urinated on self, staff members x4 to help clean. Pt given call bell and instructed to call out if she needed to void again.

## 2014-02-03 NOTE — ED Notes (Addendum)
Pt states she feels warm. Pt does not tolerate gown or sheet. Curtain is closed to maintain privacy.

## 2014-02-03 NOTE — ED Provider Notes (Signed)
CSN: [id number]     Arrival date & time 02/03/14  1743 History   First MD Initiated Contact with Patient 02/03/14 1757     Chief Complaint  Patient presents with  . Alcohol Intoxication     (Consider location/radiation/quality/duration/timing/severity/associated sxs/prior Treatment) HPI Comments: Patient presents to the emergency department with chief complaint of intoxication. Reportedly, she was found in a car with an empty 40 ounce bottle of beer and 4 empty pint sized Listerine bottles. She was found outside of the substance abuse center, where boyfriend is currently rehabbing. Per EMS, the patient states that she wants to kill herself. Level 5 caveat applies secondary to intoxication.  The history is provided by medical records, the EMS personnel and the nursing home. No language interpreter was used.    Past Medical History  Diagnosis Date  . Peptic ulcer   . Alcohol abuse   . Anemia   . Benzodiazepine abuse   . Thyroid disease   . Alcoholism   . Narcotic abuse   . Back pain   . Thrombocytopenia 06/17/2011  . Hypothyroidism   . Seizures   . Medical history non-contributory     hypoglycemic   Past Surgical History  Procedure Laterality Date  . Cholecystectomy    . Abdominal surgery    . Esophagogastroduodenoscopy    . Gastric bypass    . Tubal ligation    . Esophagogastroduodenoscopy N/A 03/23/2013    Procedure: ESOPHAGOGASTRODUODENOSCOPY (EGD);  Surgeon: Ladene Artist, MD;  Location: [facility];  Service: Endoscopy;  Laterality: N/A;  . No past surgeries      gastric bypass   Family History  Problem Relation Age of Onset  . Alcohol abuse Father   . Alcoholism Father   . Cancer Other    History  Substance Use Topics  . Smoking status: Current Every Day Smoker -- 1.00 packs/day for 12 years    Types: Cigarettes    Last Attempt to Quit: 08/08/2011  . Smokeless tobacco: Never Used  . Alcohol Use: Yes     Comment: drink 4 liters a day. 1 case/day drinks  Listerine   OB History   Grav Para Term Preterm Abortions TAB SAB Ect Mult Living   3 3  3      3      Review of Systems  Unable to perform ROS: Mental status change      Allergies  Nsaids  Home Medications   Prior to Admission medications   Medication Sig Start Date End Date Taking? Authorizing Provider  ARIPiprazole (ABILIFY) 5 MG tablet Take 1 tablet (5 mg total) by mouth daily. Mood control 12/23/13   Encarnacion Slates, NP  buPROPion (WELLBUTRIN XL) 300 MG 24 hr tablet Take 1 tablet (300 mg total) by mouth daily. For depression 12/23/13   Encarnacion Slates, NP  FLUoxetine (PROZAC) 40 MG capsule Take 1 capsule (40 mg total) by mouth daily. For depression 12/23/13   Encarnacion Slates, NP  gabapentin (NEURONTIN) 400 MG capsule Take 2 capsules (800 mg total) by mouth 3 (three) times daily. For substance withdrawal syndrome 12/23/13   Encarnacion Slates, NP  levothyroxine (SYNTHROID, LEVOTHROID) 125 MCG tablet Take 1 tablet (125 mcg total) by mouth daily before breakfast. For thyroid hormone replacement 12/23/13   Encarnacion Slates, NP  omeprazole (PRILOSEC) 20 MG capsule Take 1 capsule (20 mg total) by mouth daily. For acid reflux 12/23/13   Encarnacion Slates, NP  sulfamethoxazole-trimethoprim (BACTRIM DS) 800-160 MG per  tablet Take 1 tablet by mouth every 12 (twelve) hours. For infection 12/23/13   Encarnacion Slates, NP  traZODone (DESYREL) 100 MG tablet Take 1 tablet (100 mg total) by mouth at bedtime as needed for sleep. 12/23/13   Encarnacion Slates, NP   BP 151/82  Pulse 97  Temp(Src) 98.5 F (36.9 C) (Oral)  Resp 20  SpO2 97%  LMP 01/10/2014 Physical Exam  Nursing note and vitals reviewed. Constitutional: She is oriented to person, place, and time. She appears well-developed and well-nourished.  HENT:  Head: Normocephalic and atraumatic.  Eyes: Conjunctivae and EOM are normal. Pupils are equal, round, and reactive to light.  Neck: Normal range of motion. Neck supple.  Cardiovascular: Normal rate and regular  rhythm.  Exam reveals no gallop and no friction rub.   No murmur heard. Pulmonary/Chest: Effort normal and breath sounds normal. No respiratory distress. She has no wheezes. She has no rales. She exhibits no tenderness.  Abdominal: Soft. Bowel sounds are normal. She exhibits no distension and no mass. There is no tenderness. There is no rebound and no guarding.  Musculoskeletal: Normal range of motion. She exhibits no edema and no tenderness.  Neurological: She is alert and oriented to person, place, and time.  Skin: Skin is warm and dry.  Psychiatric: She has a normal mood and affect. Her behavior is normal. Judgment and thought content normal.    ED Course  Procedures (including critical care time) Results for orders placed during the hospital encounter of 02/03/14  CBC WITH DIFFERENTIAL      Result Value Ref Range   WBC 8.8  4.0 - 10.5 K/uL   RBC 4.08  3.87 - 5.11 MIL/uL   Hemoglobin 10.1 (*) 12.0 - 15.0 g/dL   HCT 33.3 (*) 36.0 - 46.0 %   MCV 81.6  78.0 - 100.0 fL   MCH 24.8 (*) 26.0 - 34.0 pg   MCHC 30.3  30.0 - 36.0 g/dL   RDW 18.2 (*) 11.5 - 15.5 %   Platelets 420 (*) 150 - 400 K/uL   Neutrophils Relative % 81 (*) 43 - 77 %   Neutro Abs 7.1  1.7 - 7.7 K/uL   Lymphocytes Relative 12  12 - 46 %   Lymphs Abs 1.0  0.7 - 4.0 K/uL   Monocytes Relative 6  3 - 12 %   Monocytes Absolute 0.6  0.1 - 1.0 K/uL   Eosinophils Relative 1  0 - 5 %   Eosinophils Absolute 0.0  0.0 - 0.7 K/uL   Basophils Relative 1  0 - 1 %   Basophils Absolute 0.1  0.0 - 0.1 K/uL  COMPREHENSIVE METABOLIC PANEL      Result Value Ref Range   Sodium 139  137 - 147 mEq/L   Potassium 3.8  3.7 - 5.3 mEq/L   Chloride 96  96 - 112 mEq/L   CO2 24  19 - 32 mEq/L   Glucose, Bld 103 (*) 70 - 99 mg/dL   BUN 9  6 - 23 mg/dL   Creatinine, Ser 0.82  0.50 - 1.10 mg/dL   Calcium 8.5  8.4 - 10.5 mg/dL   Total Protein 7.6  6.0 - 8.3 g/dL   Albumin 3.8  3.5 - 5.2 g/dL   AST 39 (*) 0 - 37 U/L   ALT 27  0 - 35 U/L    Alkaline Phosphatase 87  39 - 117 U/L   Total Bilirubin <0.2 (*) 0.3 - 1.2  mg/dL   GFR calc non Af Amer 85 (*) >90 mL/min   GFR calc Af Amer >90  >90 mL/min  ACETAMINOPHEN LEVEL      Result Value Ref Range   Acetaminophen (Tylenol), Serum <15.0  10 - 30 ug/mL  SALICYLATE LEVEL      Result Value Ref Range   Salicylate Lvl <2.9 (*) 2.8 - 20.0 mg/dL  ETHANOL      Result Value Ref Range   Alcohol, Ethyl (B) 400 (*) 0 - 11 mg/dL   Ct Head Wo Contrast  02/03/2014   CLINICAL DATA:  Alcohol intoxication, fall  EXAM: CT HEAD WITHOUT CONTRAST  CT CERVICAL SPINE WITHOUT CONTRAST  TECHNIQUE: Multidetector CT imaging of the head and cervical spine was performed following the standard protocol without intravenous contrast. Multiplanar CT image reconstructions of the cervical spine were also generated.  COMPARISON:  None  FINDINGS: CT HEAD FINDINGS  Normal ventricular morphology.  No midline shift or mass effect.  Normal appearance of brain parenchyma.  No intracranial hemorrhage, mass lesion, or acute infarction.  Visualized paranasal sinuses and mastoid air cells clear.  Bones unremarkable.  CT CERVICAL SPINE FINDINGS  Visualized skullbase intact.  Osseous mineralization normal.  Mild scattered facet degenerative changes.  Vertebral body and disc space heights maintained.  Prevertebral soft tissues normal thickness.  No acute fracture, subluxation or bone destruction.  IMPRESSION: No acute intracranial abnormalities.  No acute cervical spine abnormalities.   Electronically Signed   By: Lavonia Dana M.D.   On: 02/03/2014 19:34   Ct Cervical Spine Wo Contrast  02/03/2014   CLINICAL DATA:  Alcohol intoxication, fall  EXAM: CT HEAD WITHOUT CONTRAST  CT CERVICAL SPINE WITHOUT CONTRAST  TECHNIQUE: Multidetector CT imaging of the head and cervical spine was performed following the standard protocol without intravenous contrast. Multiplanar CT image reconstructions of the cervical spine were also generated.  COMPARISON:   None  FINDINGS: CT HEAD FINDINGS  Normal ventricular morphology.  No midline shift or mass effect.  Normal appearance of brain parenchyma.  No intracranial hemorrhage, mass lesion, or acute infarction.  Visualized paranasal sinuses and mastoid air cells clear.  Bones unremarkable.  CT CERVICAL SPINE FINDINGS  Visualized skullbase intact.  Osseous mineralization normal.  Mild scattered facet degenerative changes.  Vertebral body and disc space heights maintained.  Prevertebral soft tissues normal thickness.  No acute fracture, subluxation or bone destruction.  IMPRESSION: No acute intracranial abnormalities.  No acute cervical spine abnormalities.   Electronically Signed   By: Lavonia Dana M.D.   On: 02/03/2014 19:34     No results found.   EKG Interpretation None      MDM   Final diagnoses:  None   Intoxicated patient.  Brought to ED by EMS, who found the patient intoxicated in her vehicle.  Will check labs.  TTS consult.  Re-evaluation once sober.  12:47 AM Patient is alert and oriented and acting appropriately.  She wants detox.  Psych hold orders are placed.  TTS evaluation is pending. Medically clear.      Montine Circle, PA-C 02/04/14 [phone]

## 2014-02-03 NOTE — ED Notes (Signed)
Pt states she cannot produce urine sample at this time

## 2014-02-03 NOTE — ED Notes (Addendum)
Per EMS, pt was found in car with an empty 40oz bottle of beer and 4 empty pint-sized Listerine bottles. EMS states pt was found laying outside of substance abuse center that boyfriend is currently in. Pt was recently in rehab at [facility]. EMS states the patient stated she wanted to kill herself. Pt now denies SI/HI

## 2014-02-03 NOTE — ED Notes (Signed)
Bed: WA07 Expected date:  Expected time:  Means of arrival:  Comments: EMS- ETOH/listerine

## 2014-02-04 ENCOUNTER — Encounter (HOSPITAL_COMMUNITY): Payer: Self-pay | Admitting: Psychiatry

## 2014-02-04 ENCOUNTER — Observation Stay (HOSPITAL_COMMUNITY)
Admission: AD | Admit: 2014-02-04 | Discharge: 2014-02-05 | Disposition: A | Discharge status: Home / Self Care | Payer: No Typology Code available for payment source | Source: Intra-hospital | Attending: Family | Admitting: Family

## 2014-02-04 ENCOUNTER — Encounter (HOSPITAL_COMMUNITY): Payer: Self-pay | Admitting: Registered Nurse

## 2014-02-04 DIAGNOSIS — F1010 Alcohol abuse, uncomplicated: Secondary | ICD-10-CM

## 2014-02-04 DIAGNOSIS — F329 Major depressive disorder, single episode, unspecified: Secondary | ICD-10-CM | POA: Insufficient documentation

## 2014-02-04 DIAGNOSIS — F17200 Nicotine dependence, unspecified, uncomplicated: Secondary | ICD-10-CM | POA: Insufficient documentation

## 2014-02-04 DIAGNOSIS — F3289 Other specified depressive episodes: Secondary | ICD-10-CM | POA: Insufficient documentation

## 2014-02-04 DIAGNOSIS — R569 Unspecified convulsions: Secondary | ICD-10-CM | POA: Insufficient documentation

## 2014-02-04 DIAGNOSIS — Z79899 Other long term (current) drug therapy: Secondary | ICD-10-CM | POA: Insufficient documentation

## 2014-02-04 DIAGNOSIS — D696 Thrombocytopenia, unspecified: Secondary | ICD-10-CM | POA: Insufficient documentation

## 2014-02-04 DIAGNOSIS — D649 Anemia, unspecified: Secondary | ICD-10-CM | POA: Insufficient documentation

## 2014-02-04 DIAGNOSIS — F4310 Post-traumatic stress disorder, unspecified: Secondary | ICD-10-CM

## 2014-02-04 DIAGNOSIS — F10229 Alcohol dependence with intoxication, unspecified: Secondary | ICD-10-CM | POA: Insufficient documentation

## 2014-02-04 DIAGNOSIS — F1910 Other psychoactive substance abuse, uncomplicated: Secondary | ICD-10-CM

## 2014-02-04 DIAGNOSIS — F10929 Alcohol use, unspecified with intoxication, unspecified: Secondary | ICD-10-CM | POA: Diagnosis present

## 2014-02-04 DIAGNOSIS — F1020 Alcohol dependence, uncomplicated: Secondary | ICD-10-CM | POA: Insufficient documentation

## 2014-02-04 DIAGNOSIS — E039 Hypothyroidism, unspecified: Secondary | ICD-10-CM | POA: Insufficient documentation

## 2014-02-04 DIAGNOSIS — F1994 Other psychoactive substance use, unspecified with psychoactive substance-induced mood disorder: Principal | ICD-10-CM

## 2014-02-04 DIAGNOSIS — F411 Generalized anxiety disorder: Secondary | ICD-10-CM

## 2014-02-04 LAB — RAPID URINE DRUG SCREEN, HOSP PERFORMED
Amphetamines: NOT DETECTED
BENZODIAZEPINES: NOT DETECTED
Barbiturates: NOT DETECTED
Cocaine: POSITIVE — AB
OPIATES: NOT DETECTED
Tetrahydrocannabinol: NOT DETECTED

## 2014-02-04 LAB — POC URINE PREG, ED: PREG TEST UR: NEGATIVE

## 2014-02-04 LAB — ETHANOL: Alcohol, Ethyl (B): <11 mg/dL (ref 0–11)

## 2014-02-04 MED ORDER — ADULT MULTIVITAMIN W/MINERALS CH: 1.0000 | ORAL_TABLET | Freq: Every day | ORAL | Status: DC

## 2014-02-04 MED ORDER — ONDANSETRON HCL 4 MG PO TABS: 4.0000 mg | ORAL_TABLET | Freq: Once | ORAL | Status: AC

## 2014-02-04 MED ORDER — GABAPENTIN 400 MG PO CAPS
800.0000 mg | ORAL_CAPSULE | Freq: Three times a day (TID) | ORAL | Status: DC
  Administered 2014-02-04 – 2014-02-05 (×4): 800 mg via ORAL
  Filled 2014-02-04 (×9): qty 2

## 2014-02-04 MED ORDER — CHLORDIAZEPOXIDE HCL 25 MG PO CAPS
ORAL_CAPSULE | ORAL | Status: AC
  Filled 2014-02-04: qty 1

## 2014-02-04 MED ORDER — CHLORDIAZEPOXIDE HCL 25 MG PO CAPS: 25.0000 mg | ORAL_CAPSULE | ORAL | Status: DC

## 2014-02-04 MED ORDER — ACETAMINOPHEN 325 MG PO TABS
650.0000 mg | ORAL_TABLET | Freq: Four times a day (QID) | ORAL | Status: DC | PRN
  Administered 2014-02-04 – 2014-02-05 (×3): 650 mg via ORAL
  Filled 2014-02-04 (×2): qty 2

## 2014-02-04 MED ORDER — NICOTINE 21 MG/24HR TD PT24
21.0000 mg | MEDICATED_PATCH | Freq: Every day | TRANSDERMAL | Status: DC
  Administered 2014-02-04 – 2014-02-05 (×2): 21 mg via TRANSDERMAL
  Filled 2014-02-04 (×5): qty 1

## 2014-02-04 MED ORDER — LEVOTHYROXINE SODIUM 125 MCG PO TABS
125.0000 ug | ORAL_TABLET | Freq: Every day | ORAL | Status: DC
  Administered 2014-02-05: 125 ug via ORAL
  Filled 2014-02-04 (×3): qty 1

## 2014-02-04 MED ORDER — ALUM & MAG HYDROXIDE-SIMETH 200-200-20 MG/5ML PO SUSP: 30.0000 mL | ORAL | Status: DC | PRN

## 2014-02-04 MED ORDER — THIAMINE HCL 100 MG/ML IJ SOLN: 100.0000 mg | Freq: Every day | INTRAMUSCULAR | Status: DC

## 2014-02-04 MED ORDER — ARIPIPRAZOLE 5 MG PO TABS
5.0000 mg | ORAL_TABLET | Freq: Every day | ORAL | Status: DC
  Filled 2014-02-04: qty 1

## 2014-02-04 MED ORDER — ONDANSETRON 4 MG PO TBDP
4.0000 mg | ORAL_TABLET | Freq: Four times a day (QID) | ORAL | Status: DC | PRN
  Administered 2014-02-04: 4 mg via ORAL
  Filled 2014-02-04: qty 1

## 2014-02-04 MED ORDER — MAGNESIUM HYDROXIDE 400 MG/5ML PO SUSP: 30.0000 mL | Freq: Every day | ORAL | Status: DC | PRN

## 2014-02-04 MED ORDER — CHLORDIAZEPOXIDE HCL 25 MG PO CAPS: 25.0000 mg | ORAL_CAPSULE | Freq: Four times a day (QID) | ORAL | Status: DC | PRN

## 2014-02-04 MED ORDER — PANTOPRAZOLE SODIUM 40 MG PO TBEC
40.0000 mg | DELAYED_RELEASE_TABLET | Freq: Every day | ORAL | Status: DC
  Administered 2014-02-04: 40 mg via ORAL
  Filled 2014-02-04: qty 1

## 2014-02-04 MED ORDER — VITAMIN B-1 100 MG PO TABS
100.0000 mg | ORAL_TABLET | Freq: Every day | ORAL | Status: DC
  Administered 2014-02-05: 100 mg via ORAL
  Filled 2014-02-04 (×2): qty 1

## 2014-02-04 MED ORDER — CHLORDIAZEPOXIDE HCL 25 MG PO CAPS
25.0000 mg | ORAL_CAPSULE | Freq: Four times a day (QID) | ORAL | Status: DC | PRN
  Administered 2014-02-04: 25 mg via ORAL

## 2014-02-04 MED ORDER — BUPROPION HCL ER (XL) 300 MG PO TB24
300.0000 mg | ORAL_TABLET | Freq: Every day | ORAL | Status: DC
  Administered 2014-02-04: 300 mg via ORAL
  Filled 2014-02-04: qty 1

## 2014-02-04 MED ORDER — PANTOPRAZOLE SODIUM 40 MG PO TBEC
40.0000 mg | DELAYED_RELEASE_TABLET | Freq: Two times a day (BID) | ORAL | Status: DC
  Administered 2014-02-04 – 2014-02-05 (×2): 40 mg via ORAL
  Filled 2014-02-04 (×6): qty 1

## 2014-02-04 MED ORDER — HYDROXYZINE HCL 25 MG PO TABS: 25.0000 mg | ORAL_TABLET | Freq: Four times a day (QID) | ORAL | Status: DC | PRN

## 2014-02-04 MED ORDER — TRAZODONE HCL 100 MG PO TABS: 100.0000 mg | ORAL_TABLET | Freq: Every evening | ORAL | Status: DC | PRN

## 2014-02-04 MED ORDER — LEVOTHYROXINE SODIUM 125 MCG PO TABS
125.0000 ug | ORAL_TABLET | Freq: Every day | ORAL | Status: DC
  Administered 2014-02-04: 125 ug via ORAL
  Filled 2014-02-04 (×2): qty 1

## 2014-02-04 MED ORDER — ONDANSETRON 4 MG PO TBDP: 4.0000 mg | ORAL_TABLET | Freq: Four times a day (QID) | ORAL | Status: DC | PRN

## 2014-02-04 MED ORDER — CHLORDIAZEPOXIDE HCL 25 MG PO CAPS
25.0000 mg | ORAL_CAPSULE | Freq: Four times a day (QID) | ORAL | Status: AC
  Administered 2014-02-04 – 2014-02-05 (×4): 25 mg via ORAL
  Filled 2014-02-04 (×4): qty 1

## 2014-02-04 MED ORDER — CHLORDIAZEPOXIDE HCL 25 MG PO CAPS
25.0000 mg | ORAL_CAPSULE | Freq: Every day | ORAL | Status: DC
Start: 2014-02-08 — End: 2014-02-05

## 2014-02-04 MED ORDER — ARIPIPRAZOLE 5 MG PO TABS
5.0000 mg | ORAL_TABLET | Freq: Every day | ORAL | Status: DC
  Filled 2014-02-04 (×3): qty 1

## 2014-02-04 MED ORDER — ACETAMINOPHEN 325 MG PO TABS
ORAL_TABLET | ORAL | Status: AC
  Filled 2014-02-04: qty 1

## 2014-02-04 MED ORDER — CHLORDIAZEPOXIDE HCL 25 MG PO CAPS: 25.0000 mg | ORAL_CAPSULE | Freq: Three times a day (TID) | ORAL | Status: DC

## 2014-02-04 MED ORDER — CHLORDIAZEPOXIDE HCL 25 MG PO CAPS
25.0000 mg | ORAL_CAPSULE | Freq: Four times a day (QID) | ORAL | Status: DC
  Administered 2014-02-04: 25 mg via ORAL
  Filled 2014-02-04: qty 1

## 2014-02-04 MED ORDER — LOPERAMIDE HCL 2 MG PO CAPS: 2.0000 mg | ORAL_CAPSULE | ORAL | Status: DC | PRN

## 2014-02-04 MED ORDER — GI COCKTAIL ~~LOC~~
30.0000 mL | Freq: Once | ORAL | Status: AC
  Administered 2014-02-04: 30 mL via ORAL
  Filled 2014-02-04: qty 30

## 2014-02-04 MED ORDER — CHLORDIAZEPOXIDE HCL 25 MG PO CAPS: 25.0000 mg | ORAL_CAPSULE | Freq: Every day | ORAL | Status: DC

## 2014-02-04 MED ORDER — ADULT MULTIVITAMIN W/MINERALS CH
1.0000 | ORAL_TABLET | Freq: Every day | ORAL | Status: DC
  Administered 2014-02-05: 1 via ORAL
  Filled 2014-02-04 (×5): qty 1

## 2014-02-04 MED ORDER — BUPROPION HCL ER (XL) 300 MG PO TB24
300.0000 mg | ORAL_TABLET | Freq: Every day | ORAL | Status: DC
  Administered 2014-02-05: 300 mg via ORAL
  Filled 2014-02-04 (×3): qty 1

## 2014-02-04 MED ORDER — GABAPENTIN 400 MG PO CAPS
800.0000 mg | ORAL_CAPSULE | Freq: Three times a day (TID) | ORAL | Status: DC
  Administered 2014-02-04: 800 mg via ORAL
  Filled 2014-02-04 (×3): qty 2

## 2014-02-04 MED ORDER — TRAZODONE HCL 100 MG PO TABS
100.0000 mg | ORAL_TABLET | Freq: Every evening | ORAL | Status: DC | PRN
  Administered 2014-02-05: 100 mg via ORAL
  Filled 2014-02-04: qty 1
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
## 2014-02-04 NOTE — Progress Notes (Signed)
Patient ID: Jacqueline [name], female   DOB: 11-29-1965, 48 y.o.   MRN: [id number]  Pt states she is concerned about getting into a year long program in [place]. Pt states she feels as if she needs to go because nothing has worked so far. Pt requesting placement in the am for the program.

## 2014-02-04 NOTE — ED Notes (Signed)
[facility] has been called for transport.

## 2014-02-04 NOTE — Progress Notes (Signed)
Per, NP Shuvon the patient meets criteria for the OBS Unit.

## 2014-02-04 NOTE — Progress Notes (Signed)
[facility] did not get to see patient but will be sending information about [facility] program, using the address provided.  °

## 2014-02-04 NOTE — ED Notes (Signed)
Patient was up walking to the bathroom by herself several times.

## 2014-02-04 NOTE — H&P (Signed)
Psychiatric Admission Assessment Adult  Patient Identification:  Jacqueline [name] Date of Evaluation:  02/04/2014 Chief Complaint:  SUBSTANCE INDUCED MOOD DISORDER  ALCOHOL USE DISORDER,SEVERE History of Present Illness:  Patient has been drinking approximately 30 beers a day for the past two weeks after leaving [facility].  She went to visit her boyfriend yesterday at [facility] and they sent her to the ED since she was intoxicated, BAC of 400.  [name] initially had stated she was suicidal but is now sober and denying this thoughts.  She denies suicidal/homicidal ideations and hallucinations on assessment.  [name] wants to stay in the observation unit until she is detoxed and then wants to leave [facility]:  Location:  generalized. Quality:  chronic. Severity:  moderate. Timing:  constant. Duration:  2 weeks. Context:  stressors. Associated Signs/Synptoms: Depression Symptoms:  anxiety, (Hypo) Manic Symptoms:  None Anxiety Symptoms:  Excessive Worry, Psychotic Symptoms:  None PTSD Symptoms: Negative Total Time spent with patient: 20 minutes  Psychiatric Specialty Exam: Physical Exam  Constitutional: She is oriented to person, place, and time. She appears well-developed and well-nourished.  HENT:  Head: Normocephalic and atraumatic.  Neck: Normal range of motion.  Respiratory: Effort normal.  GI: Soft.  Musculoskeletal: Normal range of motion.  Neurological: She is alert and oriented to person, place, and time.  Skin: Skin is warm and dry.    Review of Systems  Constitutional: Negative.   HENT: Negative.   Eyes: Negative.   Respiratory: Negative.   Cardiovascular: Negative.   Gastrointestinal: Negative.   Genitourinary: Negative.   Musculoskeletal: Negative.   Skin: Negative.   Neurological: Negative.   Endo/Heme/Allergies: Negative.   Psychiatric/Behavioral: Positive for substance abuse.    Last menstrual period 01/10/2014.There is no weight on file to calculate BMI.   General Appearance: Casual  Eye Contact::  Fair  Speech:  Normal Rate  Volume:  Decreased  Mood:  Anxious  Affect:  Congruent  Thought Process:  Coherent  Orientation:  Full (Time, Place, and Person)  Thought Content:  WDL  Suicidal Thoughts:  No  Homicidal Thoughts:  No  Memory:  Immediate;   Good Recent;   Good Remote;   Good  Judgement:  Fair  Insight:  Fair  Psychomotor Activity:  Decreased  Concentration:  Fair  Recall:  [place] of Knowledge:Good  Language: Good  Akathisia:  No  Handed:  Right  AIMS (if indicated):     Assets:  Leisure Time Physical Health Resilience  Sleep:       Musculoskeletal: Strength & Muscle Tone: within normal limits Gait & Station: normal Patient leans: Right  Past Psychiatric History: Diagnosis:  Depression, anxiety, alcohol dependency  Hospitalizations:  Multiple  Outpatient Care:  Yes  Substance Abuse Care:  Multiple rehabs and detox  Self-Mutilation:  None  Suicidal Attempts:  Denies  Violent Behaviors:  Denies   Past Medical History:   Past Medical History  Diagnosis Date  . Peptic ulcer   . Alcohol abuse   . Anemia   . Benzodiazepine abuse   . Thyroid disease   . Alcoholism   . Narcotic abuse   . Back pain   . Thrombocytopenia 06/17/2011  . Hypothyroidism   . Seizures   . Medical history non-contributory     hypoglycemic   None. Allergies:   Allergies  Allergen Reactions  . Nsaids Other (See Comments)    G.I. Bleed   PTA Medications: Prescriptions prior to admission  Medication Sig Dispense Refill  . ARIPiprazole (  ABILIFY) 5 MG tablet Take 1 tablet (5 mg total) by mouth daily. Mood control  30 tablet  0  . buPROPion (WELLBUTRIN XL) 300 MG 24 hr tablet Take 1 tablet (300 mg total) by mouth daily. For depression  30 tablet  0  . FLUoxetine (PROZAC) 40 MG capsule Take 1 capsule (40 mg total) by mouth daily. For depression  30 capsule  0  . gabapentin (NEURONTIN) 400 MG capsule Take 2 capsules (800 mg total)  by mouth 3 (three) times daily. For substance withdrawal syndrome  180 capsule  0  . levothyroxine (SYNTHROID, LEVOTHROID) 125 MCG tablet Take 1 tablet (125 mcg total) by mouth daily before breakfast. For thyroid hormone replacement  30 tablet  1  . omeprazole (PRILOSEC) 20 MG capsule Take 1 capsule (20 mg total) by mouth daily. For acid reflux      . traZODone (DESYREL) 100 MG tablet Take 1 tablet (100 mg total) by mouth at bedtime as needed for sleep.  30 tablet  0    Previous Psychotropic Medications:  Medication/Dose   See above   Substance Abuse History in the last 12 months:  yes  Consequences of Substance Abuse: Withdrawal Symptoms:   Tremors  Social History:  reports that she has been smoking Cigarettes.  She has a 12 pack-year smoking history. She has never used smokeless tobacco. She reports that she drinks alcohol. She reports that she uses illicit drugs (Oxycodone, Cocaine, and Benzodiazepines). Additional Social History:    Current Place of Residence:   Place of Birth:   Family Members: Marital Status:  Single Children:  Sons:  Daughters: Relationships: Education:  [employer] Problems/Performance: Religious Beliefs/Practices: History of Abuse (Emotional/Phsycial/Sexual) Ship broker History:  None. Legal History: Hobbies/Interests:  Family History:   Family History  Problem Relation Age of Onset  . Alcohol abuse Father   . Alcoholism Father   . Cancer Other     Results for orders placed during the hospital encounter of 02/03/14 (from the past 72 hour(s))  CBC WITH DIFFERENTIAL     Status: Abnormal   Collection Time    02/03/14  6:16 PM      Result Value Ref Range   WBC 8.8  4.0 - 10.5 K/uL   RBC 4.08  3.87 - 5.11 MIL/uL   Hemoglobin 10.1 (*) 12.0 - 15.0 g/dL   HCT 33.3 (*) 36.0 - 46.0 %   MCV 81.6  78.0 - 100.0 fL   MCH 24.8 (*) 26.0 - 34.0 pg   MCHC 30.3  30.0 - 36.0 g/dL   RDW 18.2 (*) 11.5 - 15.5 %    Platelets 420 (*) 150 - 400 K/uL   Neutrophils Relative % 81 (*) 43 - 77 %   Neutro Abs 7.1  1.7 - 7.7 K/uL   Lymphocytes Relative 12  12 - 46 %   Lymphs Abs 1.0  0.7 - 4.0 K/uL   Monocytes Relative 6  3 - 12 %   Monocytes Absolute 0.6  0.1 - 1.0 K/uL   Eosinophils Relative 1  0 - 5 %   Eosinophils Absolute 0.0  0.0 - 0.7 K/uL   Basophils Relative 1  0 - 1 %   Basophils Absolute 0.1  0.0 - 0.1 K/uL  COMPREHENSIVE METABOLIC PANEL     Status: Abnormal   Collection Time    02/03/14  6:16 PM      Result Value Ref Range   Sodium 139  137 - 147 mEq/L  Potassium 3.8  3.7 - 5.3 mEq/L   Chloride 96  96 - 112 mEq/L   CO2 24  19 - 32 mEq/L   Glucose, Bld 103 (*) 70 - 99 mg/dL   BUN 9  6 - 23 mg/dL   Creatinine, Ser 0.82  0.50 - 1.10 mg/dL   Calcium 8.5  8.4 - 10.5 mg/dL   Total Protein 7.6  6.0 - 8.3 g/dL   Albumin 3.8  3.5 - 5.2 g/dL   AST 39 (*) 0 - 37 U/L   ALT 27  0 - 35 U/L   Alkaline Phosphatase 87  39 - 117 U/L   Total Bilirubin <0.2 (*) 0.3 - 1.2 mg/dL   GFR calc non Af Amer 85 (*) >90 mL/min   GFR calc Af Amer >90  >90 mL/min   Comment: (NOTE)     The eGFR has been calculated using the CKD EPI equation.     This calculation has not been validated in all clinical situations.     eGFR's persistently <90 mL/min signify possible Chronic Kidney     Disease.  ACETAMINOPHEN LEVEL     Status: None   Collection Time    02/03/14  6:16 PM      Result Value Ref Range   Acetaminophen (Tylenol), Serum <15.0  10 - 30 ug/mL   Comment:            THERAPEUTIC CONCENTRATIONS VARY     SIGNIFICANTLY. A RANGE OF 10-30     ug/mL MAY BE AN EFFECTIVE     CONCENTRATION FOR MANY PATIENTS.     HOWEVER, SOME ARE BEST TREATED     AT CONCENTRATIONS OUTSIDE THIS     RANGE.     ACETAMINOPHEN CONCENTRATIONS     >150 ug/mL AT 4 HOURS AFTER     INGESTION AND >50 ug/mL AT 12     HOURS AFTER INGESTION ARE     OFTEN ASSOCIATED WITH TOXIC     REACTIONS.  SALICYLATE LEVEL     Status: Abnormal    Collection Time    02/03/14  6:16 PM      Result Value Ref Range   Salicylate Lvl <8.4 (*) 2.8 - 20.0 mg/dL  ETHANOL     Status: Abnormal   Collection Time    02/03/14  6:16 PM      Result Value Ref Range   Alcohol, Ethyl (B) 400 (*) 0 - 11 mg/dL   Comment:            LOWEST DETECTABLE LIMIT FOR     SERUM ALCOHOL IS 11 mg/dL     FOR MEDICAL PURPOSES ONLY  URINE RAPID DRUG SCREEN (HOSP PERFORMED)     Status: Abnormal   Collection Time    02/04/14  1:45 AM      Result Value Ref Range   Opiates NONE DETECTED  NONE DETECTED   Cocaine POSITIVE (*) NONE DETECTED   Benzodiazepines NONE DETECTED  NONE DETECTED   Amphetamines NONE DETECTED  NONE DETECTED   Tetrahydrocannabinol NONE DETECTED  NONE DETECTED   Barbiturates NONE DETECTED  NONE DETECTED   Comment:            DRUG SCREEN FOR MEDICAL PURPOSES     ONLY.  IF CONFIRMATION IS NEEDED     FOR ANY PURPOSE, NOTIFY LAB     WITHIN 5 DAYS.                LOWEST DETECTABLE LIMITS  FOR URINE DRUG SCREEN     Drug Class       Cutoff (ng/mL)     Amphetamine      1000     Barbiturate      200     Benzodiazepine   496     Tricyclics       759     Opiates          300     Cocaine          300     THC              50  POC URINE PREG, ED     Status: None   Collection Time    02/04/14  1:50 AM      Result Value Ref Range   Preg Test, Ur NEGATIVE  NEGATIVE   Comment:            THE SENSITIVITY OF THIS     METHODOLOGY IS >24 mIU/mL  ETHANOL     Status: None   Collection Time    02/04/14  9:56 AM      Result Value Ref Range   Alcohol, Ethyl (B) <11  0 - 11 mg/dL   Comment:            LOWEST DETECTABLE LIMIT FOR     SERUM ALCOHOL IS 11 mg/dL     FOR MEDICAL PURPOSES ONLY   Psychological Evaluations:  Assessment:   DSM5:  Trauma-Stressor Disorders:  Posttraumatic Stress Disorder (309.81) Substance/Addictive Disorders:  Alcohol Related Disorder - Severe (303.90) and Alcohol Intoxication with Use Disorder - Severe  (F10.229)  AXIS I:  Alcohol Abuse, Anxiety Disorder NOS, Post Traumatic Stress Disorder and Substance Induced Mood Disorder AXIS II:  Deferred AXIS III:   Past Medical History  Diagnosis Date  . Peptic ulcer   . Alcohol abuse   . Anemia   . Benzodiazepine abuse   . Thyroid disease   . Alcoholism   . Narcotic abuse   . Back pain   . Thrombocytopenia 06/17/2011  . Hypothyroidism   . Seizures   . Medical history non-contributory     hypoglycemic   AXIS IV:  other psychosocial or environmental problems, problems related to social environment and problems with primary support group AXIS V:  51-60 moderate symptoms  Treatment Plan/Recommendations:  Plan:  Review of chart, vital signs, medications, and notes. 1-Admit for crisis management and stabilization.  Estimated length of stay less than 24 hours 2-Individual and group therapy encouraged 3-Medication management for alcohol withdrawal/detox and anxiety to reduce current symptoms to base line and improve the patient's overall level of functioning:  Medications reviewed with the patient and she stated no untoward effects, home medications in place and Librium protocol started 4-Coping skills for substance abuse and anxiety developing-- 5-Continue crisis stabilization and management 6-Address health issues--monitoring vital signs, stable  7-Treatment plan in progress to prevent relapse of substance abuse and anxiety 8-Psychosocial education regarding relapse prevention and self-care 9-Health care follow up as needed for any health concerns   Treatment Plan Summary: Medication management Current Medications:  Current Facility-Administered Medications  Medication Dose Route Frequency Provider Last Rate Last Dose  . acetaminophen (TYLENOL) 325 MG tablet           . acetaminophen (TYLENOL) tablet 650 mg  650 mg Oral Q6H PRN Shuvon Rankin, NP   650 mg at 02/04/14 1255  . alum & mag hydroxide-simeth (MAALOX/MYLANTA) 200-200-20 MG/5ML  suspension 30 mL  30 mL Oral Q4H PRN Shuvon Rankin, NP      . [START ON 02/05/2014] ARIPiprazole (ABILIFY) tablet 5 mg  5 mg Oral Daily Shuvon Rankin, NP      . [START ON 02/05/2014] buPROPion (WELLBUTRIN XL) 24 hr tablet 300 mg  300 mg Oral Daily Shuvon Rankin, NP      . chlordiazePOXIDE (LIBRIUM) 25 MG capsule           . chlordiazePOXIDE (LIBRIUM) capsule 25 mg  25 mg Oral Q6H PRN Shuvon Rankin, NP   25 mg at 02/04/14 1257  . chlordiazePOXIDE (LIBRIUM) capsule 25 mg  25 mg Oral QID Shuvon Rankin, NP       Followed by  . [START ON 02/05/2014] chlordiazePOXIDE (LIBRIUM) capsule 25 mg  25 mg Oral TID Shuvon Rankin, NP       Followed by  . [START ON 02/06/2014] chlordiazePOXIDE (LIBRIUM) capsule 25 mg  25 mg Oral BH-qamhs Shuvon Rankin, NP       Followed by  . [START ON 02/08/2014] chlordiazePOXIDE (LIBRIUM) capsule 25 mg  25 mg Oral Daily Shuvon Rankin, NP      . gabapentin (NEURONTIN) capsule 800 mg  800 mg Oral TID Shuvon Rankin, NP   800 mg at 02/04/14 1315  . hydrOXYzine (ATARAX/VISTARIL) tablet 25 mg  25 mg Oral Q6H PRN Shuvon Rankin, NP      . [START ON 02/05/2014] levothyroxine (SYNTHROID, LEVOTHROID) tablet 125 mcg  125 mcg Oral QAC breakfast Shuvon Rankin, NP      . loperamide (IMODIUM) capsule 2-4 mg  2-4 mg Oral PRN Shuvon Rankin, NP      . magnesium hydroxide (MILK OF MAGNESIA) suspension 30 mL  30 mL Oral Daily PRN Shuvon Rankin, NP      . multivitamin with minerals tablet 1 tablet  1 tablet Oral Daily Shuvon Rankin, NP      . nicotine (NICODERM CQ - dosed in mg/24 hours) patch 21 mg  21 mg Transdermal Daily Waylan Boga, NP   21 mg at 02/04/14 1319  . ondansetron (ZOFRAN-ODT) disintegrating tablet 4 mg  4 mg Oral Q6H PRN Shuvon Rankin, NP      . pantoprazole (PROTONIX) EC tablet 40 mg  40 mg Oral BID Waylan Boga, NP      . Derrill Memo ON 02/05/2014] thiamine (VITAMIN B-1) tablet 100 mg  100 mg Oral Daily Shuvon Rankin, NP       Or  . Derrill Memo ON 02/05/2014] thiamine (B-1) injection 100 mg  100 mg  Intravenous Daily Shuvon Rankin, NP      . traZODone (DESYREL) tablet 100 mg  100 mg Oral QHS PRN Shuvon Rankin, NP        Observation Level/Precautions:  15 minute checks  Laboratory:  Completed, reviewed, stable  Psychotherapy:  Individual therapy  Medications:  See above  Consultations:  None  Discharge Concerns:  None  Estimated LOS:  Less than 24 hours  Other:     I certify that inpatient services furnished can reasonably be expected to improve the patient's condition.   Waylan Boga, PMH-NP 4/30/20151:59 PM Agree with assessment and plan Geralyn Flash A. Sabra Heck, M.D.

## 2014-02-04 NOTE — ED Notes (Signed)
Pt got up and ambulated to the bathroom on her own with a steady gait and no assistance needed. Will continue to monitor for tx to secured unit if available.
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
## 2014-02-04 NOTE — ED Notes (Signed)
Report called to [facility] RN Randall Hiss. Pt has been wanded and I will arrange for [place] to pick up and tx.

## 2014-02-04 NOTE — ED Notes (Signed)
Pt asking for the phone number to [facility] on [address], which was provided by this RN.  Pt, also, asking if we can get her back to her car.  This RN informed her that we could provide a bus pass, if any are available, or we could call taxi, but she would have to pay for it.
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
## 2014-02-04 NOTE — ED Notes (Signed)
Patient has on blue scrubs and yellow socks.she has one personal belonging bag

## 2014-02-04 NOTE — BH Assessment (Signed)
02/04/2014 02/04/2014  Referrals Faxed:  [facility]   [place]  At capacity:  [facility]ikel Cella Per [employer] No ans   [facility] per Pamala Hurry has requested referral be faxed over and will be reviewed this am after d/c's. Sande Rives Disposition MHT/NS

## 2014-02-04 NOTE — Consult Note (Signed)
Face to face evaluation and I agree with this note 

## 2014-02-05 NOTE — ED Provider Notes (Signed)
Medical screening examination/treatment/procedure(s) were performed by non-physician practitioner and as supervising physician I was immediately available for consultation/collaboration.    Kathalene Frames, MD 02/05/14 [phone]

## 2014-02-05 NOTE — Progress Notes (Signed)
Discharge note: Patient discharged to home with plans to pick up her car from [facility] and to seek a long term treatment program. Patient denied SI,HI, AVH.Marland Kitchen  Belongings returned. Taken by Betsy Pries to [facility].
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
## 2014-02-05 NOTE — Discharge Instructions (Addendum)
The following programs offer residential treatment for women with substance abuse problems:       [facility] [address], [place] [address]      Admission Phone #: [phone] ext. [phone]      For admission form and process visit their website: [email or web address]       [facility]      [place], [place] [address]      [phone]       [facility]      [place], [place] [address]      [phone]      Refer to the manual and application provided by the Observation Unit staff for more detail.       [facility]      [place], [place] [address]       [phone]  While seeking residential treatment, consider enrolling in an outpatient program at one of the following locations:       [facility]      [place]:           [place]. [address] [place]. [place], [place] [address]           [phone]       [place]:           [place], [place] [address]           [phone]       [facility]      [address] [place], [place] [address]       Phone: [phone]      [facility] can also help you with ongoing mental health needs, including psychiatry/medication management and counseling.

## 2014-02-05 NOTE — Progress Notes (Signed)
    D.Patient has a depressed mood and affect. Complained of headache. Anxious.  A: Patient given emotional support from RN. Patient given medications per MD orders.   R: Patient remains cooperative and appropriate. Will continue to monitor patient for safety.

## 2014-02-07 ENCOUNTER — Emergency Department (HOSPITAL_COMMUNITY)
Admission: EM | Admit: 2014-02-07 | Discharge: 2014-02-07 | Disposition: A | Discharge status: Home / Self Care | Payer: Self-pay | Attending: Dermatology | Admitting: Dermatology

## 2014-02-07 ENCOUNTER — Encounter (HOSPITAL_COMMUNITY): Payer: Self-pay | Admitting: Emergency Medicine

## 2014-02-07 DIAGNOSIS — Z79899 Other long term (current) drug therapy: Secondary | ICD-10-CM | POA: Insufficient documentation

## 2014-02-07 DIAGNOSIS — G40909 Epilepsy, unspecified, not intractable, without status epilepticus: Secondary | ICD-10-CM | POA: Insufficient documentation

## 2014-02-07 DIAGNOSIS — Z862 Personal history of diseases of the blood and blood-forming organs and certain disorders involving the immune mechanism: Secondary | ICD-10-CM | POA: Insufficient documentation

## 2014-02-07 DIAGNOSIS — F10929 Alcohol use, unspecified with intoxication, unspecified: Secondary | ICD-10-CM

## 2014-02-07 DIAGNOSIS — F17200 Nicotine dependence, unspecified, uncomplicated: Secondary | ICD-10-CM | POA: Insufficient documentation

## 2014-02-07 DIAGNOSIS — Z8711 Personal history of peptic ulcer disease: Secondary | ICD-10-CM | POA: Insufficient documentation

## 2014-02-07 DIAGNOSIS — F1410 Cocaine abuse, uncomplicated: Secondary | ICD-10-CM | POA: Insufficient documentation

## 2014-02-07 DIAGNOSIS — F1010 Alcohol abuse, uncomplicated: Secondary | ICD-10-CM | POA: Insufficient documentation

## 2014-02-07 DIAGNOSIS — E039 Hypothyroidism, unspecified: Secondary | ICD-10-CM | POA: Insufficient documentation

## 2014-02-07 LAB — COMPREHENSIVE METABOLIC PANEL
ALT: 27 U/L (ref 0–35)
AST: 34 U/L (ref 0–37)
Albumin: 3.7 g/dL (ref 3.5–5.2)
Alkaline Phosphatase: 92 U/L (ref 39–117)
BUN: 8 mg/dL (ref 6–23)
CO2: 18 meq/L — AB (ref 19–32)
CREATININE: 0.89 mg/dL (ref 0.50–1.10)
Calcium: 8.5 mg/dL (ref 8.4–10.5)
Chloride: 104 mEq/L (ref 96–112)
GFR calc Af Amer: 89 mL/min — ABNORMAL LOW (ref >90)
GFR, EST NON AFRICAN AMERICAN: 77 mL/min — AB (ref >90)
GLUCOSE: 92 mg/dL (ref 70–99)
Potassium: 3.5 mEq/L — ABNORMAL LOW (ref 3.7–5.3)
Sodium: 142 mEq/L (ref 137–147)
Total Protein: 7.5 g/dL (ref 6.0–8.3)

## 2014-02-07 LAB — CBC
HCT: 30.4 % — ABNORMAL LOW (ref 36.0–46.0)
HEMOGLOBIN: 9.5 g/dL — AB (ref 12.0–15.0)
MCH: 25.4 pg — ABNORMAL LOW (ref 26.0–34.0)
MCHC: 31.3 g/dL (ref 30.0–36.0)
MCV: 81.3 fL (ref 78.0–100.0)
Platelets: 334 10*3/uL (ref 150–400)
RBC: 3.74 MIL/uL — AB (ref 3.87–5.11)
RDW: 18.7 % — ABNORMAL HIGH (ref 11.5–15.5)
WBC: 7.7 10*3/uL (ref 4.0–10.5)

## 2014-02-07 LAB — RAPID URINE DRUG SCREEN, HOSP PERFORMED
Amphetamines: NOT DETECTED
BARBITURATES: NOT DETECTED
Benzodiazepines: POSITIVE — AB
Cocaine: POSITIVE — AB
OPIATES: NOT DETECTED
Tetrahydrocannabinol: NOT DETECTED

## 2014-02-07 LAB — ETHANOL: Alcohol, Ethyl (B): 345 mg/dL — ABNORMAL HIGH (ref 0–11)

## 2014-02-07 LAB — ACETAMINOPHEN LEVEL: Acetaminophen (Tylenol), Serum: <15 ug/mL (ref 10–30)

## 2014-02-07 LAB — SALICYLATE LEVEL

## 2014-02-07 MED ORDER — ONDANSETRON HCL 4 MG/2ML IJ SOLN
4.0000 mg | Freq: Once | INTRAMUSCULAR | Status: AC
  Administered 2014-02-07: 4 mg via INTRAVENOUS
  Filled 2014-02-07: qty 2

## 2014-02-07 NOTE — ED Provider Notes (Signed)
CSN: [id number]     Arrival date & time 02/07/14  1318 History   First MD Initiated Contact with Patient 02/07/14 1321     Chief Complaint  Patient presents with  . Alcohol Intoxication     (Consider location/radiation/quality/duration/timing/severity/associated sxs/prior Treatment) Patient is a 48 y.o. female presenting with intoxication. The history is provided by the patient. The history is limited by the condition of the patient.  Alcohol Intoxication Pertinent negatives include no chest pain, no abdominal pain, no headaches and no shortness of breath.  pt w hx etoh abuse, dropped off in ED by friends w heavy etoh abuse today. Pt unable to quantify amount.  Pt appears intoxicated, very poor/difficult historian - level 5 caveat.  No reported trauma or fall. Pt denies pain/injury.     Past Medical History  Diagnosis Date  . Peptic ulcer   . Alcohol abuse   . Anemia   . Benzodiazepine abuse   . Thyroid disease   . Alcoholism   . Narcotic abuse   . Back pain   . Thrombocytopenia 06/17/2011  . Hypothyroidism   . Seizures   . Medical history non-contributory     hypoglycemic   Past Surgical History  Procedure Laterality Date  . Cholecystectomy    . Abdominal surgery    . Esophagogastroduodenoscopy    . Gastric bypass    . Tubal ligation    . Esophagogastroduodenoscopy N/A 03/23/2013    Procedure: ESOPHAGOGASTRODUODENOSCOPY (EGD);  Surgeon: Ladene Artist, MD;  Location: [facility];  Service: Endoscopy;  Laterality: N/A;  . No past surgeries      gastric bypass   Family History  Problem Relation Age of Onset  . Alcohol abuse Father   . Alcoholism Father   . Cancer Other    History  Substance Use Topics  . Smoking status: Current Every Day Smoker -- 1.00 packs/day for 12 years    Types: Cigarettes    Last Attempt to Quit: 08/08/2011  . Smokeless tobacco: Never Used  . Alcohol Use: Yes     Comment: drink 4 liters a day. 1 case/day drinks Listerine   OB History    Grav Para Term Preterm Abortions TAB SAB Ect Mult Living   3 3  3      3      Review of Systems  Unable to perform ROS: Mental status change  Constitutional: Negative for fever.  Respiratory: Negative for shortness of breath.   Cardiovascular: Negative for chest pain.  Gastrointestinal: Negative for abdominal pain.  Neurological: Negative for headaches.  pt markedly altered, uncooperative, ?intoxicated - level 5 caveat.       Allergies  Nsaids  Home Medications   Prior to Admission medications   Medication Sig Start Date End Date Taking? Authorizing Provider  ARIPiprazole (ABILIFY) 5 MG tablet Take 1 tablet (5 mg total) by mouth daily. Mood control 12/23/13   Encarnacion Slates, NP  buPROPion (WELLBUTRIN XL) 300 MG 24 hr tablet Take 1 tablet (300 mg total) by mouth daily. For depression 12/23/13   Encarnacion Slates, NP  FLUoxetine (PROZAC) 40 MG capsule Take 1 capsule (40 mg total) by mouth daily. For depression 12/23/13   Encarnacion Slates, NP  gabapentin (NEURONTIN) 400 MG capsule Take 2 capsules (800 mg total) by mouth 3 (three) times daily. For substance withdrawal syndrome 12/23/13   Encarnacion Slates, NP  levothyroxine (SYNTHROID, LEVOTHROID) 125 MCG tablet Take 1 tablet (125 mcg total) by mouth daily before breakfast. For  thyroid hormone replacement 12/23/13   Encarnacion Slates, NP  omeprazole (PRILOSEC) 20 MG capsule Take 1 capsule (20 mg total) by mouth daily. For acid reflux 12/23/13   Encarnacion Slates, NP  traZODone (DESYREL) 100 MG tablet Take 1 tablet (100 mg total) by mouth at bedtime as needed for sleep. 12/23/13   Encarnacion Slates, NP   BP 111/62  Pulse 74  Temp(Src) 97.3 F (36.3 C) (Temporal)  Resp 16  SpO2 95%  LMP 01/10/2014 Physical Exam  Nursing note and vitals reviewed. Constitutional: She appears well-developed and well-nourished. No distress.  HENT:  Head: Atraumatic.  Mouth/Throat: Oropharynx is clear and moist.  Eyes: Conjunctivae are normal. Pupils are equal, round, and  reactive to light. No scleral icterus.  Neck: Neck supple. No tracheal deviation present.  Cardiovascular: Normal rate, regular rhythm, normal heart sounds and intact distal pulses.   Pulmonary/Chest: Effort normal and breath sounds normal. No respiratory distress.  Abdominal: Soft. Normal appearance and bowel sounds are normal. She exhibits no distension and no mass. There is no tenderness. There is no rebound and no guarding.  Genitourinary:  No cva tenderness  Musculoskeletal: She exhibits no edema and no tenderness.  Neurological: She is alert.  Awake, speech slurred ?intoxicated. Moves bil ext purposefully. Does not follow command. Intact cough, gag, clears throat.   Skin: Skin is warm and dry. No rash noted.  Psychiatric:  Pt appears intoxicated, uncooperative.     ED Course  Procedures (including critical care time)  Results for orders placed during the hospital encounter of 02/07/14  CBC      Result Value Ref Range   WBC 7.7  4.0 - 10.5 K/uL   RBC 3.74 (*) 3.87 - 5.11 MIL/uL   Hemoglobin 9.5 (*) 12.0 - 15.0 g/dL   HCT 30.4 (*) 36.0 - 46.0 %   MCV 81.3  78.0 - 100.0 fL   MCH 25.4 (*) 26.0 - 34.0 pg   MCHC 31.3  30.0 - 36.0 g/dL   RDW 18.7 (*) 11.5 - 15.5 %   Platelets 334  150 - 400 K/uL  COMPREHENSIVE METABOLIC PANEL      Result Value Ref Range   Sodium 142  137 - 147 mEq/L   Potassium 3.5 (*) 3.7 - 5.3 mEq/L   Chloride 104  96 - 112 mEq/L   CO2 18 (*) 19 - 32 mEq/L   Glucose, Bld 92  70 - 99 mg/dL   BUN 8  6 - 23 mg/dL   Creatinine, Ser 0.89  0.50 - 1.10 mg/dL   Calcium 8.5  8.4 - 10.5 mg/dL   Total Protein 7.5  6.0 - 8.3 g/dL   Albumin 3.7  3.5 - 5.2 g/dL   AST 34  0 - 37 U/L   ALT 27  0 - 35 U/L   Alkaline Phosphatase 92  39 - 117 U/L   Total Bilirubin <0.2 (*) 0.3 - 1.2 mg/dL   GFR calc non Af Amer 77 (*) >90 mL/min   GFR calc Af Amer 89 (*) >90 mL/min  URINE RAPID DRUG SCREEN (HOSP PERFORMED)      Result Value Ref Range   Opiates NONE DETECTED  NONE  DETECTED   Cocaine POSITIVE (*) NONE DETECTED   Benzodiazepines POSITIVE (*) NONE DETECTED   Amphetamines NONE DETECTED  NONE DETECTED   Tetrahydrocannabinol NONE DETECTED  NONE DETECTED   Barbiturates NONE DETECTED  NONE DETECTED  ETHANOL      Result Value Ref  Range   Alcohol, Ethyl (B) 345 (*) 0 - 11 mg/dL  ACETAMINOPHEN LEVEL      Result Value Ref Range   Acetaminophen (Tylenol), Serum <15.0  10 - 30 ug/mL  SALICYLATE LEVEL      Result Value Ref Range   Salicylate Lvl <3.0 (*) 2.8 - 20.0 mg/dL     MDM  Iv ns. Labs.  Reviewed nursing notes and prior charts for additional history.   Recheck, pt breathing comfortably, o2 sats 98%.   Pt markedly intoxicated. Will need to be observed in ED until more sober, rechecked.   On review prior charts, pt w several prior, including just 2 days ago, inpatient stays for tx etoh abuse/detox, and appears to have chosen to not maintain sobriety.  Will sign out to oncoming provider, Dr Tomi Bamberger to reassess when more sober and dispo appropriately then.       Mirna Mires, MD 02/07/14 1440

## 2014-02-07 NOTE — ED Notes (Signed)
Pt walking out of ED, sts her friend is on his way, Dr Tomi Bamberger aware.

## 2014-02-07 NOTE — ED Notes (Signed)
Pt sts she wants to leave AMA, sts she is not as intoxicated anymore, pt ambulated to bathroom without assistance, tolerated fluids well. Dr Tomi Bamberger aware pt wants to leave AMA, pt called friend to come to pick her up from ED.

## 2014-02-07 NOTE — ED Provider Notes (Signed)
Pt is alert and awake.  She wants to go home.    She called a friend who will come and pick her up from the ED.  Kathalene Frames, MD 02/07/14 [phone]

## 2014-02-07 NOTE — ED Notes (Signed)
Pt was dropped off at the ED entrance by friend, obviously intoxicated

## 2014-02-07 NOTE — Discharge Instructions (Signed)
Do not drink alcohol - follow up with AA, and use resource guide provided for additional community resources and rehab centers. Make sure to never drink and drive.  Do not use cocaine - cocaine use can cause heart attacks and strokes - use resource guide provided. For mental health - follow up with [facility] in the next couple days. Also follow up with primary care doctor in coming week. Return to ER if worse, new symptoms, persistent vomiting, fevers, trouble breathing, severe depression or thoughts of self harm, other concern.     Alcohol Use Disorder Alcohol use disorder is a mental disorder. It is not a one-time incident of heavy drinking. Alcohol use disorder is the excessive and uncontrollable use of alcohol over time that leads to problems with functioning in one or more areas of daily living. People with this disorder risk harming themselves and others when they drink to excess. Alcohol use disorder also can cause other mental disorders, such as mood and anxiety disorders, and serious physical problems. People with alcohol use disorder often misuse other drugs.  Alcohol use disorder is common and widespread. Some people with this disorder drink alcohol to cope with or escape from negative life events. Others drink to relieve chronic pain or symptoms of mental illness. People with a family history of alcohol use disorder are at higher risk of losing control and using alcohol to excess.  SYMPTOMS  Signs and symptoms of alcohol use disorder may include the following:   Consumption ofalcohol inlarger amounts or over a longer period of time than intended.  Multiple unsuccessful attempts to cutdown or control alcohol use.   A great deal of time spent obtaining alcohol, using alcohol, or recovering from the effects of alcohol (hangover).  A strong desire or urge to use alcohol (cravings).   Continued use of alcohol despite problems at work, school, or home because of alcohol use.    Continued use of alcohol despite problems in relationships because of alcohol use.  Continued use of alcohol in situations when it is physically hazardous, such as driving a car.  Continued use of alcohol despite awareness of a physical or psychological problem that is likely related to alcohol use. Physical problems related to alcohol use can involve the brain, heart, liver, stomach, and intestines. Psychological problems related to alcohol use include intoxication, depression, anxiety, psychosis, delirium, and dementia.   The need for increased amounts of alcohol to achieve the same desired effect, or a decreased effect from the consumption of the same amount of alcohol (tolerance).  Withdrawal symptoms upon reducing or stopping alcohol use, or alcohol use to reduce or avoid withdrawal symptoms. Withdrawal symptoms include:  Racing heart.  Hand tremor.  Difficulty sleeping.  Nausea.  Vomiting.  Hallucinations.  Restlessness.  Seizures. DIAGNOSIS Alcohol use disorder is diagnosed through an assessment by your caregiver. Your caregiver may start by asking three or four questions to screen for excessive or problematic alcohol use. To confirm a diagnosis of alcohol use disorder, at least two symptoms (see SYMPTOMS) must be present within a 85-month period. The severity of alcohol use disorder depends on the number of symptoms:  Mild two or three.  Moderate four or five.  Severe six or more. Your caregiver may perform a physical exam or use results from lab tests to see if you have physical problems resulting from alcohol use. Your caregiver may refer you to a mental health professional for evaluation. TREATMENT  Some people with alcohol use disorder are able to  reduce their alcohol use to low-risk levels. Some people with alcohol use disorder need to quit drinking alcohol. When necessary, mental health professionals with specialized training in substance use treatment can help.  Your caregiver can help you decide how severe your alcohol use disorder is and what type of treatment you need. The following forms of treatment are available:   Detoxification. Detoxification involves the use of prescription medication to prevent alcohol withdrawal symptoms in the first week after quitting. This is important for people with a history of symptoms of withdrawal and for heavy drinkers who are likely to have withdrawal symptoms. Alcohol withdrawal can be dangerous and, in severe cases, cause death. Detoxification is usually provided in a hospital or in-patient substance use treatment facility.  Counseling or talk therapy. Talk therapy is provided by substance use treatment counselors. It addresses the reasons people use alcohol and ways to keep them from drinking again. The goals of talk therapy are to help people with alcohol use disorder find healthy activities and ways to cope with life stress, to identify and avoid triggers for alcohol use, and to handle cravings, which can cause relapse.  Medication.Different medications can help treat alcohol use disorder through the following actions:  Decrease alcohol cravings.  Decrease the positive reward response felt from alcohol use.  Produce an uncomfortable physical reaction when alcohol is used (aversion therapy).  Support groups. Support groups are run by people who have quit drinking. They provide emotional support, advice, and guidance. These forms of treatment are often combined. Some people with alcohol use disorder benefit from intensive combination treatment provided by specialized substance use treatment centers. Both inpatient and outpatient treatment programs are available. Document Released: 11/01/2004 Document Revised: 05/27/2013 Document Reviewed: 01/01/2013 [facility] Patient Information 2014 [place].     Alcohol Problems Most adults who drink alcohol drink in moderation (not a lot) are at low risk for  developing problems related to their drinking. However, all drinkers, including low-risk drinkers, should know about the health risks connected with drinking alcohol. RECOMMENDATIONS FOR LOW-RISK DRINKING  Drink in moderation. Moderate drinking is defined as follows:   Men - no more than 2 drinks per day.  Nonpregnant women - no more than 1 drink per day.  Over age 28 - no more than 1 drink per day. A standard drink is 12 grams of pure alcohol, which is equal to a 12 ounce bottle of beer or wine cooler, a 5 ounce glass of wine, or 1.5 ounces of distilled spirits (such as whiskey, brandy, vodka, or rum).  ABSTAIN FROM (DO NOT DRINK) ALCOHOL:  When pregnant or considering pregnancy.  When taking a medication that interacts with alcohol.  If you are alcohol dependent.  A medical condition that prohibits drinking alcohol (such as ulcer, liver disease, or heart disease). DISCUSS WITH YOUR CAREGIVER:  If you are at risk for coronary heart disease, discuss the potential benefits and risks of alcohol use: Light to moderate drinking is associated with lower rates of coronary heart disease in certain populations (for example, men over age 76 and postmenopausal women). Infrequent or nondrinkers are advised not to begin light to moderate drinking to reduce the risk of coronary heart disease so as to avoid creating an alcohol-related problem. Similar protective effects can likely be gained through proper diet and exercise.  Women and the elderly have smaller amounts of body water than men. As a result women and the elderly achieve a higher blood alcohol concentration after drinking the same amount  of alcohol.  Exposing a fetus to alcohol can cause a broad range of birth defects referred to as Fetal Alcohol Syndrome (FAS) or Alcohol-Related Birth Defects (ARBD). Although FAS/ARBD is connected with excessive alcohol consumption during pregnancy, studies also have reported neurobehavioral problems in  infants born to mothers reporting drinking an average of 1 drink per day during pregnancy.  Heavier drinking (the consumption of more than 4 drinks per occasion by men and more than 3 drinks per occasion by women) impairs learning (cognitive) and psychomotor functions and increases the risk of alcohol-related problems, including accidents and injuries. CAGE QUESTIONS:   Have you ever felt that you should Cut down on your drinking?  Have people Annoyed you by criticizing your drinking?  Have you ever felt bad or Guilty about your drinking?  Have you ever had a drink first thing in the morning to steady your nerves or get rid of a hangover (Eye opener)? If you answered positively to any of these questions: You may be at risk for alcohol-related problems if alcohol consumption is:   Men: Greater than 14 drinks per week or more than 4 drinks per occasion.  Women: Greater than 7 drinks per week or more than 3 drinks per occasion. Do you or your family have a medical history of alcohol-related problems, such as:  Blackouts.  Sexual dysfunction.  Depression.  Trauma.  Liver dysfunction.  Sleep disorders.  Hypertension.  Chronic abdominal pain.  Has your drinking ever caused you problems, such as problems with your family, problems with your work (or school) performance, or accidents/injuries?  Do you have a compulsion to drink or a preoccupation with drinking?  Do you have poor control or are you unable to stop drinking once you have started?  Do you have to drink to avoid withdrawal symptoms?  Do you have problems with withdrawal such as tremors, nausea, sweats, or mood disturbances?  Does it take more alcohol than in the past to get you high?  Do you feel a strong urge to drink?  Do you change your plans so that you can have a drink?  Do you ever drink in the morning to relieve the shakes or a hangover? If you have answered a number of the previous questions positively,  it may be time for you to talk to your caregivers, family, and friends and see if they think you have a problem. Alcoholism is a chemical dependency that keeps getting worse and will eventually destroy your health and relationships. Many alcoholics end up dead, impoverished, or in prison. This is often the end result of all chemical dependency.  Do not be discouraged if you are not ready to take action immediately.  Decisions to change behavior often involve up and down desires to change and feeling like you cannot decide.  Try to think more seriously about your drinking behavior.  Think of the reasons to quit. WHERE TO GO FOR ADDITIONAL INFORMATION   The [place] on Alcohol Abuse and Alcoholism (NIAAA) http://www.bradshaw.com/  [employer] on Alcoholism and Drug Dependence (NCADD) www.ncadd.[place] (ASAM) http://carpenter.net/  Document Released: 09/24/2005 Document Revised: 12/17/2011 Document Reviewed: 05/12/2008 [facility] Patient Information 2014 [place].    Alcohol Intoxication Alcohol intoxication occurs when the amount of alcohol that a person has consumed impairs his or her ability to mentally and physically function. Alcohol directly impairs the normal chemical activity of the brain. Drinking large amounts of alcohol can lead to changes in mental function  and behavior, and it can cause many physical effects that can be harmful.  Alcohol intoxication can range in severity from mild to very severe. Various factors can affect the level of intoxication that occurs, such as the person's age, gender, weight, frequency of alcohol consumption, and the presence of other medical conditions (such as diabetes, seizures, or heart conditions). Dangerous levels of alcohol intoxication may occur when people drink large amounts of alcohol in a short period (binge drinking). Alcohol can also be especially dangerous when combined with certain prescription  medicines or "recreational" drugs. SIGNS AND SYMPTOMS Some common signs and symptoms of mild alcohol intoxication include:  Loss of coordination.  Changes in mood and behavior.  Impaired judgment.  Slurred speech. As alcohol intoxication progresses to more severe levels, other signs and symptoms will appear. These may include:  Vomiting.  Confusion and impaired memory.  Slowed breathing.  Seizures.  Loss of consciousness. DIAGNOSIS  Your health care provider will take a medical history and perform a physical exam. You will be asked about the amount and type of alcohol you have consumed. Blood tests will be done to measure the concentration of alcohol in your blood. In many places, your blood alcohol level must be lower than 80 mg/dL (0.08%) to legally drive. However, many dangerous effects of alcohol can occur at much lower levels.  TREATMENT  People with alcohol intoxication often do not require treatment. Most of the effects of alcohol intoxication are temporary, and they go away as the alcohol naturally leaves the body. Your health care provider will monitor your condition until you are stable enough to go home. Fluids are sometimes given through an IV access tube to help prevent dehydration.  HOME CARE INSTRUCTIONS  Do not drive after drinking alcohol.  Stay hydrated. Drink enough water and fluids to keep your urine clear or pale yellow. Avoid caffeine.   Only take over-the-counter or prescription medicines as directed by your health care provider.  SEEK MEDICAL CARE IF:   You have persistent vomiting.   You do not feel better after a few days.  You have frequent alcohol intoxication. Your health care provider can help determine if you should see a substance use treatment counselor. SEEK IMMEDIATE MEDICAL CARE IF:   You become shaky or tremble when you try to stop drinking.   You shake uncontrollably (seizure).   You throw up (vomit) blood. This may be bright  red or may look like black coffee grounds.   You have blood in your stool. This may be bright red or may appear as a black, tarry, bad smelling stool.   You become lightheaded or faint.  MAKE SURE YOU:   Understand these instructions.  Will watch your condition.  Will get help right away if you are not doing well or get worse. Document Released: 07/04/2005 Document Revised: 05/27/2013 Document Reviewed: 02/27/2013 [facility] Patient Information 2014 [place].    Cocaine Abuse and Chemical Dependency WHEN IS DRUG USE A PROBLEM? Anytime drug use is interfering with normal living activities it has become abuse. This includes problems with family and friends. Psychological dependence has developed when your mind tells you that the drug is needed. This is usually followed by physical dependence which has developed when continuing increases of drug are required to get the same feeling or "high". This is known as addiction or chemical dependency. A person's risk is much higher if there is a history of chemical dependency in the family. SIGNS OF CHEMICAL DEPENDENCY:  Been told by friends or family that drugs have become a problem.  Fighting when using drugs.  Having blackouts (not remembering what you do while using).  Feel sick from using drugs but continue using.  Lie about use or amounts of drugs (chemicals) used.  Need chemicals to get you going.  Suffer in work Systems analyst or school because of drug use.  Get sick from use of drugs but continue to use anyway.  Need drugs to relate to people or feel comfortable in social situations.  Use drugs to forget problems. Yes answered to any of the above signs of chemical dependency indicates there are problems. The longer the use of drugs continues, the greater the problems will become. If there is a family history of drug or alcohol use it is best not to experiment with these drugs. Experimentation leads to tolerance and needing  to use more of the drug to get the same feeling. This is followed by addiction where drugs become the most important part of life. It becomes more important to take drugs than participate in the other usual activities of life including relating to friends and family. Addiction is followed by dependency where drugs are now needed not just to get high but to feel normal. Addiction cannot be cured but it can be stopped. This often requires outside help and the care of professionals. Treatment centers are listed in the yellow pages under: Cocaine, Narcotics, and Alcoholics Anonymous. Most hospitals and clinics can refer you to a specialized care center. WHAT IS COCAINE? Cocaine is a strong nervous system stimulant which speeds up the body and gives the user the feeling that they have increased energy, loss of appetite and feelings of great pleasure. This "high" which begins within several minutes and lasts for less than an hour is followed by a "crash". The crash and depressed feelings that come with it cause a craving for the drug to regain the high. HOW IS COCANINE USED? Cocaine is snorted, injected, and smoked as free- base or crack. Because smoking the drug produces a greater high it is also associated with a greater low. It is therefore more rapidly addicting. WHAT ARE THE EFFECTS OF COCAINE? It is an anesthetic (pain killer) and a stimulant (it causes a high which gives a false feeling of well being). It increases heart and breathing rates with increases in body temperature and blood pressure. It removes appetite. It causes seizures (convulsions) along with nausea (feeling sick to your stomach), vomiting and stomach pain. This dangerous combination can lead to death. Trying to keep the high feeling leads to greater and greater drug use and this leads to addiction. Addiction can only be helped by stopping use of all chemicals. This is hard but may save your life. If the addiction is continued, the only  possible outcome is loss of self respect and self esteem, violence, death, and eventually prison if the addict is fortunate enough to be caught and able to receive help prior to this last life ending event. OTHER HEALTH RISKS OF COCAINE AND ALL DRUG USE ARE:  The increased possibility of getting AIDS or hepatitis (liver inflammation).  Having a baby born which is addicted to cocaine and must go through painful withdrawal including shaking, jerking, and crying in pain. Many of the babies die. Other babies go through life with lifelong disabilities and learning problems. HOW TO STAY DRUG FREE ONCE YOU HAVE QUIT USING:  Develop healthy activities and form friends who do not use drugs.  Stay away from the drug scene.  Tell the pusher or former friend you have other better things to do.  Have ready excuses available about why you cannot use. For more help or information contact your local physician, clinic, hospital or dial 1-800-cocaine ([phone]). Document Released: 09/21/2000 Document Revised: 12/17/2011 Document Reviewed: 05/12/2008 [facility] Patient Information 2014 [place].    Emergency Department Resource Guide 1) Find a Doctor and Pay Out of Pocket Although you won't have to find out who is covered by your insurance plan, it is a good idea to ask around and get recommendations. You will then need to call the office and see if the doctor you have chosen will accept you as a new patient and what types of options they offer for patients who are self-pay. Some doctors offer discounts or will set up payment plans for their patients who do not have insurance, but you will need to ask so you aren't surprised when you get to your appointment.  2) Contact Your Local Health Department Not all health departments have doctors that can see patients for sick visits, but many do, so it is worth a call to see if yours does. If you don't know where your local health department is, you can  check in your phone book. The CDC also has a tool to help you locate your state's health department, and many state websites also have listings of all of their local health departments.  3) Find a [facility] If your illness is not likely to be very severe or complicated, you may want to try a walk in clinic. These are popping up all over the country in pharmacies, drugstores, and shopping centers. They're usually staffed by nurse practitioners or physician assistants that have been trained to treat common illnesses and complaints. They're usually fairly quick and inexpensive. However, if you have serious medical issues or chronic medical problems, these are probably not your best option.  No Primary Care Doctor: - Call Health Connect at  [phone] - they can help you locate a primary care doctor that  accepts your insurance, provides certain services, etc. - Physician Referral Service- [phone]  Chronic Pain Problems: Organization         Address  Phone   Notes  [facility]  [phone] Patients need to be referred by their primary care doctor.   Medication Assistance: Organization         Address  Phone   Notes  [facility] Medication [facility] [address] [place], [place] [address] [phone] --Must be a resident of [facility] -- Must have NO insurance coverage whatsoever (no Medicaid/ Medicare, etc.) -- The pt. MUST have a primary care doctor that directs their care regularly and follows them in the community   [facility]  [phone]   [employer]  [phone]    Agencies that provide inexpensive medical care: Organization         Address  Phone   Notes  [place]  [phone]   [facility]    [phone]   [facility], [place] [address] [phone]   [place] [address], [place] [phone]    Planned Parenthood    [phone]   [facility]    [phone]   [place] and [place], [employer] Phone:  [phone], Fax:  [phone] Hours of Operation:  9 am - 6 pm, M-F.  Also accepts Medicaid/Medicare and self-pay.  [facility] for [address], [address], [place] Phone: [phone], Fax: [phone]. Hours of Operation:  8:30 am - 5:30 pm, M-F.  Also accepts Medicaid and self-pay.  [facility] [place] [address], [place] Phone: [phone]   [place], [place], [place] [phone], Ext. [phone] Mondays & Thursdays: 7-9 AM.  First 15 patients are seen on a first come, first serve basis.    [place] Providers:  Organization         Address  Phone   Notes  [facility] [address], [place] [phone] Also accepts self-pay patients.  [facility] [id number] [place], [place]  [phone]   [place], [address], [place] [phone]   [facility] [address] [place] [phone]   Lucianne Lei [address], [place]   [phone] Only accepts [place] patients after they have their name applied to their card.   Self-Pay (no insurance) in [facility]:  Organization         Address  Phone   Notes  Sickle Cell Patients, [facility] [place] [phone]   [facility] [place] [phone]   [facility] [place]  [place], [place], [place] [phone]   [facility]/Dr. Osei-Bonsu  [address], [place] or [address], [place] [phone] Phone number for both [place] and [place] locations is the  same.  Urgent Medical and [facility] [address] [place] [phone]   [facility] [address], [place] or [address] [phone]   [facility] [address] [place] [phone], phone; [phone], fax Sees patients 1st and 3rd Saturday of every month.  Must not qualify for public or private insurance (i.e. Medicaid, Medicare, [place] Health Choice, Veterans' Benefits)  Household income should be no more than 200% of the poverty level The clinic cannot treat you if you are pregnant or think you are pregnant  Sexually transmitted diseases are not treated at the clinic.    Dental Care: Organization         Address  Phone  Notes  [facility] Department of [facility] [place] [phone] Accepts children up to age 20 who are enrolled in [place] or [place]; pregnant women with a Medicaid card; and children who have applied for Medicaid or [place] Health Choice, but were declined, whose parents can pay a reduced fee at time of service.  [facility] Department of [facility]  [address], [place] [phone] Accepts children up to age 100 who are enrolled in [place] or [place]; pregnant women with a Medicaid card; and children who have applied for Medicaid or [employer] Choice, but were declined, whose parents can pay a reduced fee at time of service.  [place] [facility]  [place] [phone] Patients are seen by appointment only. Walk-ins are  not accepted. [place] will see patients 52 years of age and older. Monday - Tuesday (8am-5pm) Most Wednesdays (8:30-5pm) $30 per visit, cash only  [facility] Adult [facility] Access PRO[facility]  [address], [facility] [phone] Patients are seen by appointment only. Walk-ins are not accepted. [place] will see patients  54 years of age and older. One Wednesday Evening (Monthly: Volunteer Based).  $30 per visit, cash only  [place]  [phone] for adults; Children under age 37, call Graduate Pediatric Dentistry at [phone]. Children aged 74-14, please call [phone] to request a pediatric application.  Dental services are provided in all areas of dental care including fillings, crowns and bridges, complete and partial dentures, implants, gum treatment, root canals, and extractions. Preventive care is also provided. Treatment is provided to both adults and children. Patients are selected via a lottery and there is often a waiting list.   [facility] [address], [place]  [phone] [email or web address]   [facility] [address] [place], [place] [phone], Ext. [phone] Second and Fourth Thursday of each month, opens at 6:30 AM; Clinic ends at 9 AM.  Patients are seen on a first-come first-served basis, and a limited number are seen during each clinic.   [facility]  [address] [place], [place] [phone]   Eligibility Requirements You must have lived in [place], [place], or [place] counties for at least the last three months.   You cannot be eligible for state or federal sponsored [employer], including [employer], [place], or [employer].   You generally cannot be eligible for healthcare insurance through your employer.    How to apply: Eligibility screenings are held every Tuesday and Wednesday afternoon from 1:00 pm until 4:00 pm. You do not need an appointment for the interview!  [facility] [address] [place], [place]   [place]  [place] Department  [place]  [phone]    Behavioral Health Resources in the Community: Intensive Outpatient  Programs Organization         Address  Phone  Notes  [place]. [address] [place], [place] [phone]   [facility] Outpatient [address], [place], [place]   ADS: [facility] Drug [facility] [address] [place], [place]   [place] [address],  [place], [place] or [phone]   Substance Abuse Resources Organization         Address  Phone  Notes  Alcohol and Drug Services  [phone]   [place]  [phone]   [facility]   Chinita Pester  [phone]   Residential & Outpatient Substance Abuse Program  [phone]   Psychological Services Organization         Address  Phone  Notes  [facility] [place]  [place]  [phone]   [place] [address] [place] [phone] or [phone]    Mobile Crisis Teams Organization         Address  Phone  Notes  [facility], Mobile Crisis Care Unit  [phone]   [facility]  [address] [place], [place]   [facility] [address], [place] [phone]    Self-Help/Support  Groups Organization         Address  Phone             Notes  [facility] - variety of support groups  [place] Call for more information  Narcotics Anonymous (NA), [facility] [address], [employer] [place]  2 meetings at this location   Special educational needs teacher         Address  Phone  Notes  [facility] [place],    [place]  [phone]   [facility]  [address], [place] [phone], [place], [place]   [place], [place] [phone] Admissions: 8am-3pm M-F  Incentives Substance [place] [address]    [place], [place]  [phone]   [facility] [address] [place], [place], [place]   [facility] [address],  [place], [place]   [facility] - Intensive Outpatient [address], [place], [place]   [facility] ([place].,  [place], [place] [phone] or [phone]   [facility] ([facility]) [address], [place], [place] Accepts Medicaid  [facility] [address]  [place] [phone] Substance Abuse/Addiction Treatment   [facility] Organization         Address  Phone  Notes  [facility]  [phone]   Domenic Schwab, PhD [address] [place], [place]   [phone] or [phone]   [place], [place] [phone]   [facility] [address], [place], [place] [phone] Insurance/Medicaid/sponsorship through [facility] and Families [address]                                    [place], [place] [phone] [place] [address], [place] [phone]    Dr. Adele Schilder  [phone]   [facility] Dept. 1) [address], [place] 2) [place] 3)  [place] [address], [place] [phone]  [phone]   [place] [phone] or [phone] (After Hours)

## 2014-03-20 DIAGNOSIS — L247 Irritant contact dermatitis due to plants, except food: Secondary | ICD-10-CM | POA: Insufficient documentation

## 2014-03-21 DIAGNOSIS — D509 Iron deficiency anemia, unspecified: Secondary | ICD-10-CM | POA: Insufficient documentation

## 2014-05-04 ENCOUNTER — Emergency Department (HOSPITAL_COMMUNITY)
Admission: EM | Admit: 2014-05-04 | Discharge: 2014-05-05 | Disposition: A | Discharge status: Home / Self Care | Payer: Self-pay | Attending: Emergency Medicine | Admitting: Emergency Medicine

## 2014-05-04 ENCOUNTER — Emergency Department (HOSPITAL_COMMUNITY)
Admission: EM | Admit: 2014-05-04 | Discharge: 2014-05-04 | Discharge status: Against Medical Advice | Payer: Self-pay | Attending: Emergency Medicine | Admitting: Emergency Medicine

## 2014-05-04 ENCOUNTER — Encounter (HOSPITAL_COMMUNITY): Payer: Self-pay | Admitting: Emergency Medicine

## 2014-05-04 DIAGNOSIS — R569 Unspecified convulsions: Secondary | ICD-10-CM | POA: Insufficient documentation

## 2014-05-04 DIAGNOSIS — F1010 Alcohol abuse, uncomplicated: Secondary | ICD-10-CM | POA: Insufficient documentation

## 2014-05-04 DIAGNOSIS — F17200 Nicotine dependence, unspecified, uncomplicated: Secondary | ICD-10-CM | POA: Insufficient documentation

## 2014-05-04 DIAGNOSIS — Z79899 Other long term (current) drug therapy: Secondary | ICD-10-CM | POA: Insufficient documentation

## 2014-05-04 DIAGNOSIS — E039 Hypothyroidism, unspecified: Secondary | ICD-10-CM | POA: Insufficient documentation

## 2014-05-04 DIAGNOSIS — F10920 Alcohol use, unspecified with intoxication, uncomplicated: Secondary | ICD-10-CM

## 2014-05-04 DIAGNOSIS — Z8719 Personal history of other diseases of the digestive system: Secondary | ICD-10-CM | POA: Insufficient documentation

## 2014-05-04 DIAGNOSIS — Z008 Encounter for other general examination: Secondary | ICD-10-CM | POA: Insufficient documentation

## 2014-05-04 DIAGNOSIS — Z862 Personal history of diseases of the blood and blood-forming organs and certain disorders involving the immune mechanism: Secondary | ICD-10-CM | POA: Insufficient documentation

## 2014-05-04 DIAGNOSIS — Z9119 Patient's noncompliance with other medical treatment and regimen: Secondary | ICD-10-CM | POA: Insufficient documentation

## 2014-05-04 DIAGNOSIS — E079 Disorder of thyroid, unspecified: Secondary | ICD-10-CM | POA: Insufficient documentation

## 2014-05-04 DIAGNOSIS — Z91199 Patient's noncompliance with other medical treatment and regimen due to unspecified reason: Secondary | ICD-10-CM | POA: Insufficient documentation

## 2014-05-04 DIAGNOSIS — K279 Peptic ulcer, site unspecified, unspecified as acute or chronic, without hemorrhage or perforation: Secondary | ICD-10-CM | POA: Insufficient documentation

## 2014-05-04 DIAGNOSIS — R51 Headache: Secondary | ICD-10-CM | POA: Insufficient documentation

## 2014-05-04 DIAGNOSIS — F10229 Alcohol dependence with intoxication, unspecified: Secondary | ICD-10-CM | POA: Insufficient documentation

## 2014-05-04 DIAGNOSIS — G40909 Epilepsy, unspecified, not intractable, without status epilepticus: Secondary | ICD-10-CM | POA: Insufficient documentation

## 2014-05-04 LAB — CBC WITH DIFFERENTIAL/PLATELET
Basophils Absolute: 0.1 10*3/uL (ref 0.0–0.1)
Basophils Absolute: 0.1 10*3/uL (ref 0.0–0.1)
Basophils Relative: 0 % (ref 0–1)
Basophils Relative: 1 % (ref 0–1)
Eosinophils Absolute: 0.1 10*3/uL (ref 0.0–0.7)
Eosinophils Absolute: 0.1 10*3/uL (ref 0.0–0.7)
Eosinophils Relative: 1 % (ref 0–5)
Eosinophils Relative: 2 % (ref 0–5)
HCT: 32.4 % — ABNORMAL LOW (ref 36.0–46.0)
HCT: 32.8 % — ABNORMAL LOW (ref 36.0–46.0)
HEMOGLOBIN: 10.3 g/dL — AB (ref 12.0–15.0)
Hemoglobin: 10.3 g/dL — ABNORMAL LOW (ref 12.0–15.0)
Lymphocytes Relative: 11 % — ABNORMAL LOW (ref 12–46)
Lymphocytes Relative: 33 % (ref 12–46)
Lymphs Abs: 1.4 10*3/uL (ref 0.7–4.0)
Lymphs Abs: 1.6 10*3/uL (ref 0.7–4.0)
MCH: 25.4 pg — ABNORMAL LOW (ref 26.0–34.0)
MCH: 25.8 pg — ABNORMAL LOW (ref 26.0–34.0)
MCHC: 31.4 g/dL (ref 30.0–36.0)
MCHC: 31.8 g/dL (ref 30.0–36.0)
MCV: 81 fL (ref 78.0–100.0)
MCV: 81.2 fL (ref 78.0–100.0)
MONOS PCT: 7 % (ref 3–12)
Monocytes Absolute: 0.7 10*3/uL (ref 0.1–1.0)
Monocytes Absolute: 0.9 10*3/uL (ref 0.1–1.0)
Monocytes Relative: 14 % — ABNORMAL HIGH (ref 3–12)
NEUTROS ABS: 10.4 10*3/uL — AB (ref 1.7–7.7)
NEUTROS PCT: 81 % — AB (ref 43–77)
Neutro Abs: 2.4 10*3/uL (ref 1.7–7.7)
Neutrophils Relative %: 50 % (ref 43–77)
Platelets: 456 10*3/uL — ABNORMAL HIGH (ref 150–400)
Platelets: 463 10*3/uL — ABNORMAL HIGH (ref 150–400)
RBC: 3.99 MIL/uL (ref 3.87–5.11)
RBC: 4.05 MIL/uL (ref 3.87–5.11)
RDW: 20.5 % — ABNORMAL HIGH (ref 11.5–15.5)
RDW: 20.7 % — ABNORMAL HIGH (ref 11.5–15.5)
WBC: 12.9 10*3/uL — AB (ref 4.0–10.5)
WBC: 4.9 10*3/uL (ref 4.0–10.5)

## 2014-05-04 LAB — BASIC METABOLIC PANEL
Anion gap: 16 — ABNORMAL HIGH (ref 5–15)
BUN: 9 mg/dL (ref 6–23)
CO2: 24 mEq/L (ref 19–32)
Calcium: 8.5 mg/dL (ref 8.4–10.5)
Chloride: 103 mEq/L (ref 96–112)
Creatinine, Ser: 0.91 mg/dL (ref 0.50–1.10)
GFR calc Af Amer: 87 mL/min — ABNORMAL LOW (ref >90)
GFR calc non Af Amer: 75 mL/min — ABNORMAL LOW (ref >90)
Glucose, Bld: 85 mg/dL (ref 70–99)
Potassium: 4 mEq/L (ref 3.7–5.3)
Sodium: 143 mEq/L (ref 137–147)

## 2014-05-04 LAB — ETHANOL: Alcohol, Ethyl (B): 345 mg/dL — ABNORMAL HIGH (ref 0–11)

## 2014-05-04 NOTE — ED Notes (Signed)
Patient was here earlier requesting detox from ETOH.  Patient also drinks mouthwash.  Patient signed AMA and left department.  Patient returns via [facility] after drinking mouthwash.  Patient yelling, cursing and being belligerent upon arrival.  Security and [employer] called to bedside.

## 2014-05-04 NOTE — ED Notes (Signed)
Pt states she is an alcoholic last drink one hour ago. States she drinks every day and she is here today for detox. Pt smells of ETOH and keeps falling asleep. States she has no plans to hurt herself or others.

## 2014-05-04 NOTE — ED Notes (Signed)
Pt. Stating "I want to leave, I am not a harm to myself or others, I just want my stuff so I can leave." EDP notified.

## 2014-05-04 NOTE — ED Notes (Addendum)
Pt. Intoxicated. Pt. Reports drinking Listerine. Pt. Disconnected IV fluids. [employer] at bedside.

## 2014-05-04 NOTE — ED Provider Notes (Signed)
CSN: [id number]     Arrival date & time 05/04/14  2226 History  This chart was scribed for Delora Fuel, MD by Vernell Barrier, ED scribe. This patient was seen in room APAH8/APAH8 and the patient's care was started at 11:28 PM.    Chief Complaint  Patient presents with  . Alcohol Intoxication   LEVEL 5 CAVEAT FOR INTOXICATION  The history is provided by medical records. No language interpreter was used.   HPI Comments: Jacqueline [name] is a 48 y.o. female who presents to the Emergency Department here for alcohol detox. Pt was here earlier today for alcohol detox and left AMA. Returns tonight and states she has drank 3 bottles of Listerine and wants detox. Per nurse; upon return patient was yelling, cursing, and was belligerent. Denies regular use of illicit drugs. Admits to depression. Sates she has been without her medications for the past 2 days. Pt went through alcohol detox back in April 2015 at [facility]. Denies SI or HI.   Past Medical History  Diagnosis Date  . Peptic ulcer   . Alcohol abuse   . Anemia   . Benzodiazepine abuse   . Thyroid disease   . Alcoholism   . Narcotic abuse   . Back pain   . Thrombocytopenia 06/17/2011  . Hypothyroidism   . Seizures   . Medical history non-contributory     hypoglycemic   Past Surgical History  Procedure Laterality Date  . Cholecystectomy    . Abdominal surgery    . Esophagogastroduodenoscopy    . Gastric bypass    . Tubal ligation    . Esophagogastroduodenoscopy N/A 03/23/2013    Procedure: ESOPHAGOGASTRODUODENOSCOPY (EGD);  Surgeon: Ladene Artist, MD;  [facility];  Service: Endoscopy;  Laterality: N/A;  . No past surgeries      gastric bypass   Family History  Problem Relation Age of Onset  . Alcohol abuse Father   . Alcoholism Father   . Cancer Other    History  Substance Use Topics  . Smoking status: Current Every Day Smoker -- 1.00 packs/day for 12 years    Types: Cigarettes    Last Attempt to Quit:  08/08/2011  . Smokeless tobacco: Never Used  . Alcohol Use: Yes     Comment: drink 4 liters a day. 1 case/day drinks Listerine   OB History   Grav Para Term Preterm Abortions TAB SAB Ect Mult Living   3 3  3      3      Review of Systems  Unable to perform ROS  Allergies  Nsaids  Home Medications   Prior to Admission medications   Medication Sig Start Date End Date Taking? Authorizing Provider  ARIPiprazole (ABILIFY) 5 MG tablet Take 1 tablet (5 mg total) by mouth daily. Mood control 12/23/13   Encarnacion Slates, NP  buPROPion (WELLBUTRIN XL) 300 MG 24 hr tablet Take 1 tablet (300 mg total) by mouth daily. For depression 12/23/13   Encarnacion Slates, NP  FLUoxetine (PROZAC) 40 MG capsule Take 1 capsule (40 mg total) by mouth daily. For depression 12/23/13   Encarnacion Slates, NP  gabapentin (NEURONTIN) 400 MG capsule Take 2 capsules (800 mg total) by mouth 3 (three) times daily. For substance withdrawal syndrome 12/23/13   Encarnacion Slates, NP  levothyroxine (SYNTHROID, LEVOTHROID) 125 MCG tablet Take 1 tablet (125 mcg total) by mouth daily before breakfast. For thyroid hormone replacement 12/23/13   Encarnacion Slates, NP  omeprazole (PRILOSEC) 20 MG capsule Take 1 capsule (20 mg total) by mouth daily. For acid reflux 12/23/13   Encarnacion Slates, NP  traZODone (DESYREL) 100 MG tablet Take 1 tablet (100 mg total) by mouth at bedtime as needed for sleep. 12/23/13   Encarnacion Slates, NP   LMP 05/04/2014  Physical Exam  Nursing note and vitals reviewed. Constitutional: She is oriented to person, place, and time. She appears well-developed and well-nourished. No distress.  HENT:  Head: Normocephalic and atraumatic.  Eyes: Conjunctivae and EOM are normal. Pupils are equal, round, and reactive to light.  Neck: Normal range of motion. Neck supple. No JVD present.  Cardiovascular: Normal rate, regular rhythm and normal heart sounds.   No murmur heard. Pulmonary/Chest: Effort normal and breath sounds normal. No  respiratory distress. She has no wheezes. She has no rales.  Abdominal: Soft. Bowel sounds are normal. She exhibits no distension and no mass. There is no tenderness.  Musculoskeletal: Normal range of motion. She exhibits no edema.  Lymphadenopathy:    She has no cervical adenopathy.  Neurological: She is alert and oriented to person, place, and time. She has normal reflexes. No cranial nerve deficit.  Appears intoxicated. Slow to answer questions.   Skin: Skin is warm and dry. No rash noted.  Psychiatric: She has a normal mood and affect. Her behavior is normal.    ED Course  Procedures (including critical care time)  COORDINATION OF CARE: At 11:30 PM: Patient will be further monitored for intoxication.  Labs Review Results for orders placed during the hospital encounter of 05/04/14  CBC WITH DIFFERENTIAL      Result Value Ref Range   WBC 12.9 (*) 4.0 - 10.5 K/uL   RBC 3.99  3.87 - 5.11 MIL/uL   Hemoglobin 10.3 (*) 12.0 - 15.0 g/dL   HCT 32.4 (*) 36.0 - 46.0 %   MCV 81.2  78.0 - 100.0 fL   MCH 25.8 (*) 26.0 - 34.0 pg   MCHC 31.8  30.0 - 36.0 g/dL   RDW 20.7 (*) 11.5 - 15.5 %   Platelets 456 (*) 150 - 400 K/uL   Neutrophils Relative % 81 (*) 43 - 77 %   Neutro Abs 10.4 (*) 1.7 - 7.7 K/uL   Lymphocytes Relative 11 (*) 12 - 46 %   Lymphs Abs 1.4  0.7 - 4.0 K/uL   Monocytes Relative 7  3 - 12 %   Monocytes Absolute 0.9  0.1 - 1.0 K/uL   Eosinophils Relative 1  0 - 5 %   Eosinophils Absolute 0.1  0.0 - 0.7 K/uL   Basophils Relative 0  0 - 1 %   Basophils Absolute 0.1  0.0 - 0.1 K/uL  COMPREHENSIVE METABOLIC PANEL      Result Value Ref Range   Sodium 141  137 - 147 mEq/L   Potassium 3.5 (*) 3.7 - 5.3 mEq/L   Chloride 103  96 - 112 mEq/L   CO2 21  19 - 32 mEq/L   Glucose, Bld 91  70 - 99 mg/dL   BUN 9  6 - 23 mg/dL   Creatinine, Ser 0.83  0.50 - 1.10 mg/dL   Calcium 8.1 (*) 8.4 - 10.5 mg/dL   Total Protein 7.5  6.0 - 8.3 g/dL   Albumin 3.9  3.5 - 5.2 g/dL   AST 49 (*) 0  - 37 U/L   ALT 37 (*) 0 - 35 U/L   Alkaline Phosphatase 108  39 - 117 U/L   Total Bilirubin <0.2 (*) 0.3 - 1.2 mg/dL   GFR calc non Af Amer 84 (*) >90 mL/min   GFR calc Af Amer >90  >90 mL/min   Anion gap 17 (*) 5 - 15  ETHANOL      Result Value Ref Range   Alcohol, Ethyl (B) 313 (*) 0 - 11 mg/dL  URINE RAPID DRUG SCREEN (HOSP PERFORMED)      Result Value Ref Range   Opiates NONE DETECTED  NONE DETECTED   Cocaine POSITIVE (*) NONE DETECTED   Benzodiazepines NONE DETECTED  NONE DETECTED   Amphetamines NONE DETECTED  NONE DETECTED   Tetrahydrocannabinol NONE DETECTED  NONE DETECTED   Barbiturates NONE DETECTED  NONE DETECTED  ETHANOL      Result Value Ref Range   Alcohol, Ethyl (B) 187 (*) 0 - 11 mg/dL  POC URINE PREG, ED      Result Value Ref Range   Preg Test, Ur NEGATIVE  NEGATIVE    MDM   Final diagnoses:  Alcohol intoxication, uncomplicated  Alcohol abuse   Alcohol intoxication and patient requesting detox. Old records are reviewed and she had been in the ED earlier today and the left before she was seen by a physician. When asked about this, she states that she had headache. She has numerous ED visits for alcohol related issues and has been admitted at [facility]. Because of time that has elapsed from her prior ED visit, screening labs we'll need to be redrawn and consultation will be obtained with TTS once results are back.  Patient was sleeping in the ED for several hours before she was able to talk with TTS. After her interview with TTS, she decided she did not want to go through detox after all and requested to be discharged. I did have a brief discussion with her regarding appropriate use of the ED and informed her that we would be happy to help her get connected for detox anytime she truly wishes to but if she is not going to go through with it a detox program, there is reason to come to the ED patient expressed understanding.  I personally  performed the services described in this documentation, which was scribed in my presence. The recorded information has been reviewed and is accurate.     Delora Fuel, MD [id number]/93 [id number]

## 2014-05-05 LAB — COMPREHENSIVE METABOLIC PANEL
ALBUMIN: 3.9 g/dL (ref 3.5–5.2)
ALK PHOS: 108 U/L (ref 39–117)
ALT: 37 U/L — ABNORMAL HIGH (ref 0–35)
AST: 49 U/L — ABNORMAL HIGH (ref 0–37)
Anion gap: 17 — ABNORMAL HIGH (ref 5–15)
BUN: 9 mg/dL (ref 6–23)
CHLORIDE: 103 meq/L (ref 96–112)
CO2: 21 mEq/L (ref 19–32)
Calcium: 8.1 mg/dL — ABNORMAL LOW (ref 8.4–10.5)
Creatinine, Ser: 0.83 mg/dL (ref 0.50–1.10)
GFR calc non Af Amer: 84 mL/min — ABNORMAL LOW (ref >90)
GLUCOSE: 91 mg/dL (ref 70–99)
Potassium: 3.5 mEq/L — ABNORMAL LOW (ref 3.7–5.3)
SODIUM: 141 meq/L (ref 137–147)
Total Protein: 7.5 g/dL (ref 6.0–8.3)

## 2014-05-05 LAB — RAPID URINE DRUG SCREEN, HOSP PERFORMED
Amphetamines: NOT DETECTED
BARBITURATES: NOT DETECTED
BENZODIAZEPINES: NOT DETECTED
Cocaine: POSITIVE — AB
Opiates: NOT DETECTED
TETRAHYDROCANNABINOL: NOT DETECTED

## 2014-05-05 LAB — POC URINE PREG, ED: Preg Test, Ur: NEGATIVE

## 2014-05-05 LAB — ETHANOL
Alcohol, Ethyl (B): 187 mg/dL — ABNORMAL HIGH (ref 0–11)
Alcohol, Ethyl (B): 313 mg/dL — ABNORMAL HIGH (ref 0–11)

## 2014-05-05 MED ORDER — ZOLPIDEM TARTRATE 5 MG PO TABS: 5.0000 mg | ORAL_TABLET | Freq: Every evening | ORAL | Status: DC | PRN

## 2014-05-05 MED ORDER — TRAZODONE HCL 50 MG PO TABS: 100.0000 mg | ORAL_TABLET | Freq: Every evening | ORAL | Status: DC | PRN

## 2014-05-05 MED ORDER — ARIPIPRAZOLE 5 MG PO TABS
5.0000 mg | ORAL_TABLET | Freq: Every day | ORAL | Status: DC
  Filled 2014-05-05 (×2): qty 1

## 2014-05-05 MED ORDER — ALUM & MAG HYDROXIDE-SIMETH 200-200-20 MG/5ML PO SUSP: 30.0000 mL | ORAL | Status: DC | PRN

## 2014-05-05 MED ORDER — LEVOTHYROXINE SODIUM 125 MCG PO TABS
125.0000 ug | ORAL_TABLET | Freq: Every day | ORAL | Status: DC
  Filled 2014-05-05 (×2): qty 1

## 2014-05-05 MED ORDER — PANTOPRAZOLE SODIUM 40 MG PO TBEC: 40.0000 mg | DELAYED_RELEASE_TABLET | Freq: Every day | ORAL | Status: DC

## 2014-05-05 MED ORDER — LORAZEPAM 1 MG PO TABS
1.0000 mg | ORAL_TABLET | Freq: Three times a day (TID) | ORAL | Status: DC | PRN
  Administered 2014-05-05: 1 mg via ORAL
  Filled 2014-05-05: qty 1

## 2014-05-05 MED ORDER — NICOTINE 21 MG/24HR TD PT24: 21.0000 mg | MEDICATED_PATCH | Freq: Every day | TRANSDERMAL | Status: DC

## 2014-05-05 MED ORDER — FLUOXETINE HCL 20 MG PO CAPS: 40.0000 mg | ORAL_CAPSULE | Freq: Every day | ORAL | Status: DC

## 2014-05-05 MED ORDER — BUPROPION HCL ER (XL) 300 MG PO TB24
300.0000 mg | ORAL_TABLET | Freq: Every day | ORAL | Status: DC
  Filled 2014-05-05 (×2): qty 1

## 2014-05-05 MED ORDER — GABAPENTIN 400 MG PO CAPS: 800.0000 mg | ORAL_CAPSULE | Freq: Three times a day (TID) | ORAL | Status: DC

## 2014-05-05 MED ORDER — ONDANSETRON HCL 4 MG PO TABS
4.0000 mg | ORAL_TABLET | Freq: Three times a day (TID) | ORAL | Status: DC | PRN
  Administered 2014-05-05: 4 mg via ORAL
  Filled 2014-05-05: qty 1

## 2014-05-05 MED ORDER — ACETAMINOPHEN 325 MG PO TABS: 650.0000 mg | ORAL_TABLET | ORAL | Status: DC | PRN

## 2014-05-05 NOTE — ED Notes (Signed)
Pt. Reports feeling anxious. Pt. Reports she is having nausea.See MAR for intervention.

## 2014-05-05 NOTE — ED Notes (Signed)
TTS at bedside. 

## 2014-05-05 NOTE — ED Notes (Signed)
Pt. Unable to provide urine sample at this time. Pt. Reports that she will notify when able. Pt. Refusing in and out cath at this time.

## 2014-05-05 NOTE — ED Notes (Signed)
Pt. Combative at triage and refusing vitals.
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
## 2014-05-05 NOTE — Discharge Instructions (Signed)
Alcohol Withdrawal °Anytime drug use is interfering with normal living activities it has become abuse. This includes problems with family and friends. Psychological dependence has developed when your mind tells you that the drug is needed. This is usually followed by physical dependence when a continuing increase of drugs are required to get the same feeling or "high." This is known as addiction or chemical dependency. A person's risk is much higher if there is a history of chemical dependency in the family. °Mild Withdrawal Following Stopping Alcohol, When Addiction or Chemical Dependency Has Developed °When a person has developed tolerance to alcohol, any sudden stopping of alcohol can cause uncomfortable physical symptoms. Most of the time these are mild and consist of tremors in the hands and increases in heart rate, breathing, and temperature. Sometimes these symptoms are associated with anxiety, panic attacks, and bad dreams. There may also be stomach upset. Normal sleep patterns are often interrupted with periods of inability to sleep (insomnia). This may last for 6 months. Because of this discomfort, many people choose to continue drinking to get rid of this discomfort and to try to feel normal. °Severe Withdrawal with Decreased or No Alcohol Intake, When Addiction or Chemical Dependency Has Developed °About five percent of alcoholics will develop signs of severe withdrawal when they stop using alcohol. One sign of this is development of generalized seizures (convulsions). Other signs of this are severe agitation and confusion. This may be associated with believing in things which are not real or seeing things which are not really there (delusions and hallucinations). Vitamin deficiencies are usually present if alcohol intake has been long-term. Treatment for this most often requires hospitalization and close observation. °Addiction can only be helped by stopping use of all chemicals. This is hard but may  save your life. With continual alcohol use, possible outcomes are usually loss of self respect and esteem, violence, and death. °Addiction cannot be cured but it can be stopped. This often requires outside help and the care of professionals. Treatment centers are listed in the yellow pages under Cocaine, Narcotics, and Alcoholics Anonymous. Most hospitals and clinics can refer you to a specialized care center. °It is not necessary for you to go through the uncomfortable symptoms of withdrawal. Your caregiver can provide you with medicines that will help you through this difficult period. Try to avoid situations, friends, or drugs that made it possible for you to keep using alcohol in the past. Learn how to say no. °It takes a long period of time to overcome addictions to all drugs, including alcohol. There may be many times when you feel as though you want a drink. After getting rid of the physical addiction and withdrawal, you will have a lessening of the craving which tells you that you need alcohol to feel normal. Call your caregiver if more support is needed. Learn who to talk to in your family and among your friends so that during these periods you can receive outside help. Alcoholics Anonymous (AA) has helped many people over the years. To get further help, contact AA or call your caregiver, counselor, or clergyperson. Al-Anon and Alateen are support groups for friends and family members of an alcoholic. The people who love and care for an alcoholic often need help, too. For information about these organizations, check your phone directory or call a local alcoholism treatment center.  °SEEK IMMEDIATE MEDICAL CARE IF:  °· You have a seizure. °· You have a fever. °· You experience uncontrolled vomiting or you   vomit up blood. This may be bright red or look like black coffee grounds. °· You have blood in the stool. This may be bright red or appear as a black, tarry, bad-smelling stool. °· You become lightheaded or  faint. Do not drive if you feel this way. Have someone else drive you or call 911 for help. °· You become more agitated or confused. °· You develop uncontrolled anxiety. °· You begin to see things that are not really there (hallucinate). °Your caregiver has determined that you completely understand your medical condition, and that your mental state is back to normal. You understand that you have been treated for alcohol withdrawal, have agreed not to drink any alcohol for a minimum of 1 day, will not operate a car or other machinery for 24 hours, and have had an opportunity to ask any questions about your condition. °Document Released: 07/04/2005 Document Revised: 12/17/2011 Document Reviewed: 05/12/2008 °ExitCare® Patient Information ©2015 [employer]. This information is not intended to replace advice given to you by your health care provider. Make sure you discuss any questions you have with your health care provider. ° °

## 2014-05-10 NOTE — ED Provider Notes (Signed)
CSN: [id number]     Arrival date & time 05/04/14  84 History   First MD Initiated Contact with Patient 05/04/14 1639     Chief Complaint  Patient presents with  . V70.1     (Consider location/radiation/quality/duration/timing/severity/associated sxs/prior Treatment) HPI  48 year old female with alcohol abuse. Presents the emergency room requesting detox. Last was earlier today. Patient reports that she been drinking Listerine which she commonly does. She denies any suicidal homicidal ideation. She cannot tell me what specifically prompted her to present today. She denies any other ingestion. Denies any recent seizure activity. Denies any hallucinations.  Past Medical History  Diagnosis Date  . Peptic ulcer   . Alcohol abuse   . Anemia   . Benzodiazepine abuse   . Thyroid disease   . Alcoholism   . Narcotic abuse   . Back pain   . Thrombocytopenia 06/17/2011  . Hypothyroidism   . Seizures   . Medical history non-contributory     hypoglycemic   Past Surgical History  Procedure Laterality Date  . Cholecystectomy    . Abdominal surgery    . Esophagogastroduodenoscopy    . Gastric bypass    . Tubal ligation    . Esophagogastroduodenoscopy N/A 03/23/2013    Procedure: ESOPHAGOGASTRODUODENOSCOPY (EGD);  Surgeon: Ladene Artist, MD;  Location: [facility];  Service: Endoscopy;  Laterality: N/A;  . No past surgeries      gastric bypass   Family History  Problem Relation Age of Onset  . Alcohol abuse Father   . Alcoholism Father   . Cancer Other    History  Substance Use Topics  . Smoking status: Current Every Day Smoker -- 1.00 packs/day for 12 years    Types: Cigarettes    Last Attempt to Quit: 08/08/2011  . Smokeless tobacco: Never Used  . Alcohol Use: Yes     Comment: drink 4 liters a day. 1 case/day drinks Listerine   OB History   Grav Para Term Preterm Abortions TAB SAB Ect Mult Living   3 3  3      3      Review of Systems  All systems reviewed and  negative, other than as noted in HPI.   Allergies  Nsaids  Home Medications   Prior to Admission medications   Medication Sig Start Date End Date Taking? Authorizing Provider  ARIPiprazole (ABILIFY) 5 MG tablet Take 1 tablet (5 mg total) by mouth daily. Mood control 12/23/13   Encarnacion Slates, NP  buPROPion (WELLBUTRIN XL) 300 MG 24 hr tablet Take 1 tablet (300 mg total) by mouth daily. For depression 12/23/13   Encarnacion Slates, NP  FLUoxetine (PROZAC) 40 MG capsule Take 1 capsule (40 mg total) by mouth daily. For depression 12/23/13   Encarnacion Slates, NP  gabapentin (NEURONTIN) 400 MG capsule Take 2 capsules (800 mg total) by mouth 3 (three) times daily. For substance withdrawal syndrome 12/23/13   Encarnacion Slates, NP  levothyroxine (SYNTHROID, LEVOTHROID) 125 MCG tablet Take 1 tablet (125 mcg total) by mouth daily before breakfast. For thyroid hormone replacement 12/23/13   Encarnacion Slates, NP  omeprazole (PRILOSEC) 20 MG capsule Take 1 capsule (20 mg total) by mouth daily. For acid reflux 12/23/13   Encarnacion Slates, NP  traZODone (DESYREL) 100 MG tablet Take 1 tablet (100 mg total) by mouth at bedtime as needed for sleep. 12/23/13   Encarnacion Slates, NP   BP 108/61  Pulse 78  Temp(Src) 98.6 F (  37 C) (Oral)  Resp 16  Ht 5\' 9"  (1.753 m)  Wt 180 lb (81.647 kg)  BMI 26.57 kg/m2  SpO2 98%  LMP 05/04/2014 Physical Exam  Nursing note and vitals reviewed. Constitutional: She appears well-developed. No distress.  HENT:  Head: Normocephalic and atraumatic.  Eyes: Conjunctivae are normal. Right eye exhibits no discharge. Left eye exhibits no discharge.  Neck: Neck supple.  Cardiovascular: Normal rate, regular rhythm and normal heart sounds.  Exam reveals no gallop and no friction rub.   No murmur heard. Pulmonary/Chest: Effort normal and breath sounds normal. No respiratory distress.  Abdominal: Soft. She exhibits no distension. There is no tenderness.  Musculoskeletal: She exhibits no edema and no  tenderness.  Neurological: She is alert.  Oriented to self and place. Did tell me that it was July 2015, but not the specific date.  Skin: Skin is warm and dry. She is not diaphoretic.  Psychiatric:  Intoxicated. Speech somewhat slurred. Inappropriately loud at times.    ED Course  Procedures (including critical care time) Labs Review Labs Reviewed  CBC WITH DIFFERENTIAL - Abnormal; Notable for the following:    Hemoglobin 10.3 (*)    HCT 32.8 (*)    MCH 25.4 (*)    RDW 20.5 (*)    Platelets 463 (*)    Monocytes Relative 14 (*)    All other components within normal limits  BASIC METABOLIC PANEL - Abnormal; Notable for the following:    GFR calc non Af Amer 75 (*)    GFR calc Af Amer 87 (*)    Anion gap 16 (*)    All other components within normal limits  ETHANOL - Abnormal; Notable for the following:    Alcohol, Ethyl (B) 345 (*)    All other components within normal limits    Imaging Review No results found.   EKG Interpretation   Date/Time:  Tuesday May 04 2014 17:18:39 EDT Ventricular Rate:  73 PR Interval:  155 QRS Duration: 104 QT Interval:  422 QTC Calculation: 465 R Axis:   81 Text Interpretation:  Sinus rhythm ED PHYSICIAN INTERPRETATION AVAILABLE  IN [facility] Confirmed by TEST, Record ([id number]) on 05/06/2014 1:31:31  PM      MDM   Final diagnoses:  None    48 year old female who with topical intoxication and requesting detox. History of alcohol abuse. Atropine the emergency room for several hours she decided that she wanted to leave. She is not suicidal homicidal. She is here voluntarily. I believe she needs help with regards to her alcohol abuse, but I have no reason to involuntarily commit her. She is discharged in no acute distress.    [name], MD 05/10/14 1121

## 2014-05-12 ENCOUNTER — Encounter (HOSPITAL_COMMUNITY): Payer: Self-pay | Admitting: Emergency Medicine

## 2014-05-12 ENCOUNTER — Emergency Department (HOSPITAL_COMMUNITY)
Admission: EM | Admit: 2014-05-12 | Discharge: 2014-05-13 | Disposition: A | Discharge status: Home / Self Care | Payer: No Typology Code available for payment source | Attending: Emergency Medicine | Admitting: Emergency Medicine

## 2014-05-12 ENCOUNTER — Emergency Department (HOSPITAL_COMMUNITY)
Admission: EM | Admit: 2014-05-12 | Discharge: 2014-05-12 | Discharge status: Against Medical Advice | Payer: Self-pay | Attending: Emergency Medicine | Admitting: Emergency Medicine

## 2014-05-12 DIAGNOSIS — Z8739 Personal history of other diseases of the musculoskeletal system and connective tissue: Secondary | ICD-10-CM | POA: Insufficient documentation

## 2014-05-12 DIAGNOSIS — F1010 Alcohol abuse, uncomplicated: Secondary | ICD-10-CM | POA: Insufficient documentation

## 2014-05-12 DIAGNOSIS — Z79899 Other long term (current) drug therapy: Secondary | ICD-10-CM | POA: Insufficient documentation

## 2014-05-12 DIAGNOSIS — Z8639 Personal history of other endocrine, nutritional and metabolic disease: Secondary | ICD-10-CM | POA: Insufficient documentation

## 2014-05-12 DIAGNOSIS — F1910 Other psychoactive substance abuse, uncomplicated: Secondary | ICD-10-CM

## 2014-05-12 DIAGNOSIS — F329 Major depressive disorder, single episode, unspecified: Secondary | ICD-10-CM | POA: Insufficient documentation

## 2014-05-12 DIAGNOSIS — F10220 Alcohol dependence with intoxication, uncomplicated: Secondary | ICD-10-CM

## 2014-05-12 DIAGNOSIS — F17200 Nicotine dependence, unspecified, uncomplicated: Secondary | ICD-10-CM | POA: Insufficient documentation

## 2014-05-12 DIAGNOSIS — F3289 Other specified depressive episodes: Secondary | ICD-10-CM | POA: Insufficient documentation

## 2014-05-12 DIAGNOSIS — F411 Generalized anxiety disorder: Secondary | ICD-10-CM | POA: Insufficient documentation

## 2014-05-12 DIAGNOSIS — F1994 Other psychoactive substance use, unspecified with psychoactive substance-induced mood disorder: Secondary | ICD-10-CM

## 2014-05-12 DIAGNOSIS — F1021 Alcohol dependence, in remission: Secondary | ICD-10-CM | POA: Insufficient documentation

## 2014-05-12 DIAGNOSIS — F10229 Alcohol dependence with intoxication, unspecified: Secondary | ICD-10-CM | POA: Insufficient documentation

## 2014-05-12 DIAGNOSIS — F32A Depression, unspecified: Secondary | ICD-10-CM

## 2014-05-12 DIAGNOSIS — Z862 Personal history of diseases of the blood and blood-forming organs and certain disorders involving the immune mechanism: Secondary | ICD-10-CM | POA: Insufficient documentation

## 2014-05-12 DIAGNOSIS — R1110 Vomiting, unspecified: Secondary | ICD-10-CM | POA: Insufficient documentation

## 2014-05-12 DIAGNOSIS — F10929 Alcohol use, unspecified with intoxication, unspecified: Secondary | ICD-10-CM | POA: Diagnosis present

## 2014-05-12 DIAGNOSIS — Z872 Personal history of diseases of the skin and subcutaneous tissue: Secondary | ICD-10-CM | POA: Insufficient documentation

## 2014-05-12 DIAGNOSIS — F4310 Post-traumatic stress disorder, unspecified: Secondary | ICD-10-CM

## 2014-05-12 DIAGNOSIS — F10920 Alcohol use, unspecified with intoxication, uncomplicated: Secondary | ICD-10-CM

## 2014-05-12 DIAGNOSIS — E039 Hypothyroidism, unspecified: Secondary | ICD-10-CM | POA: Insufficient documentation

## 2014-05-12 DIAGNOSIS — R45851 Suicidal ideations: Secondary | ICD-10-CM | POA: Insufficient documentation

## 2014-05-12 LAB — COMPREHENSIVE METABOLIC PANEL
ALT: 96 U/L — ABNORMAL HIGH (ref 0–35)
ANION GAP: 21 — AB (ref 5–15)
AST: 144 U/L — ABNORMAL HIGH (ref 0–37)
Albumin: 3.4 g/dL — ABNORMAL LOW (ref 3.5–5.2)
Alkaline Phosphatase: 118 U/L — ABNORMAL HIGH (ref 39–117)
BUN: 13 mg/dL (ref 6–23)
CALCIUM: 8.2 mg/dL — AB (ref 8.4–10.5)
CO2: 20 mEq/L (ref 19–32)
CREATININE: 0.81 mg/dL (ref 0.50–1.10)
Chloride: 97 mEq/L (ref 96–112)
GFR, EST NON AFRICAN AMERICAN: 86 mL/min — AB (ref >90)
GLUCOSE: 98 mg/dL (ref 70–99)
Potassium: 4.4 mEq/L (ref 3.7–5.3)
SODIUM: 138 meq/L (ref 137–147)
TOTAL PROTEIN: 7.4 g/dL (ref 6.0–8.3)
Total Bilirubin: 0.2 mg/dL — ABNORMAL LOW (ref 0.3–1.2)

## 2014-05-12 LAB — ACETAMINOPHEN LEVEL: Acetaminophen (Tylenol), Serum: <15 ug/mL (ref 10–30)

## 2014-05-12 LAB — CBC
HEMATOCRIT: 36.1 % (ref 36.0–46.0)
HEMOGLOBIN: 11.2 g/dL — AB (ref 12.0–15.0)
MCH: 25.5 pg — ABNORMAL LOW (ref 26.0–34.0)
MCHC: 31 g/dL (ref 30.0–36.0)
MCV: 82.2 fL (ref 78.0–100.0)
Platelets: 454 10*3/uL — ABNORMAL HIGH (ref 150–400)
RBC: 4.39 MIL/uL (ref 3.87–5.11)
RDW: 20.8 % — ABNORMAL HIGH (ref 11.5–15.5)
WBC: 6.4 10*3/uL (ref 4.0–10.5)

## 2014-05-12 LAB — ETHANOL: Alcohol, Ethyl (B): 285 mg/dL — ABNORMAL HIGH (ref 0–11)

## 2014-05-12 LAB — SALICYLATE LEVEL

## 2014-05-12 MED ORDER — VITAMIN B-1 100 MG PO TABS
100.0000 mg | ORAL_TABLET | Freq: Every day | ORAL | Status: DC
  Administered 2014-05-12 – 2014-05-13 (×2): 100 mg via ORAL
  Filled 2014-05-12 (×2): qty 1

## 2014-05-12 MED ORDER — LORAZEPAM 1 MG PO TABS
0.0000 mg | ORAL_TABLET | Freq: Four times a day (QID) | ORAL | Status: DC
  Administered 2014-05-12 – 2014-05-13 (×4): 2 mg via ORAL
  Filled 2014-05-12 (×4): qty 2

## 2014-05-12 MED ORDER — ONDANSETRON 4 MG PO TBDP
4.0000 mg | ORAL_TABLET | Freq: Once | ORAL | Status: AC
  Administered 2014-05-12: 4 mg via ORAL
  Filled 2014-05-12: qty 1

## 2014-05-12 MED ORDER — THIAMINE HCL 100 MG/ML IJ SOLN: 100.0000 mg | Freq: Every day | INTRAMUSCULAR | Status: DC

## 2014-05-12 MED ORDER — LORAZEPAM 1 MG PO TABS: 0.0000 mg | ORAL_TABLET | Freq: Two times a day (BID) | ORAL | Status: DC

## 2014-05-12 NOTE — ED Notes (Signed)
Pt took off her hospital gown, ripped EKG leads off and stickers off and got dressed. Pt walked out of room down hallway, I asked pt to stay in room and lay back down on the bed so we can monitor her heart rate. Pt refusing and kept on walking down Jacqueline [name]. [employer] and security assisted pt back to room.

## 2014-05-12 NOTE — ED Notes (Signed)
Belongings in [place] [address]
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
## 2014-05-12 NOTE — BH Assessment (Signed)
Assessment completed. Consulted with [name], PA-C who recommended inpatient treatment. [facility] at capacity. TTS will contact other facilities for placement. Dr. [name] has been notified of the recommendation.

## 2014-05-12 NOTE — ED Notes (Signed)
Pt. To [facility] from ED ambulatory without difficulty. Report from [employer]. Pt. Is alert and oriented, warm and dry in no distress. Pt. Denies HI, and AVH and pain. Pt. States she is still thinking about hurting herself at this point. Pt. Calm and cooperative. Pt. Encouraged to let Nursing staff know if he has concerns or needs.

## 2014-05-12 NOTE — ED Notes (Signed)
Per EMS: Pt called EMS from the side of the road.  Has been drinking hand sanitizer.  Called because she was suicidal with plan to jump off a bridge.  Once police arrived, pt denied but was being combative.  Agreed to come to hospital but is still being uncooperative.

## 2014-05-12 NOTE — ED Notes (Signed)
Sitter at bedside.

## 2014-05-12 NOTE — ED Notes (Addendum)
Pt sts she has not been drinking alcohol as much as she has been in a couple of days and sts she can no longer drink alcohol because it makes her sick. Pt sts her last drink was this morning. Pt sts normal alcohol intake is 3-4 quarts/day of beer. Pt sts she want to get help because she doesn't want to throw up anymore. Pt sts she has had previous inpatient treatment. Pt has hx of seizures from detox.

## 2014-05-12 NOTE — ED Notes (Addendum)
Went in room to ambulate pt. Pt not in room, armband, blood pressure cuff noted on bed. Checked bathrooms, waiting room and halls. No patient found. MD notified

## 2014-05-12 NOTE — Discharge Instructions (Signed)
Alcohol Use Disorder °Alcohol use disorder is a mental disorder. It is not a one-time incident of heavy drinking. Alcohol use disorder is the excessive and uncontrollable use of alcohol over time that leads to problems with functioning in one or more areas of daily living. People with this disorder risk harming themselves and others when they drink to excess. Alcohol use disorder also can cause other mental disorders, such as mood and anxiety disorders, and serious physical problems. People with alcohol use disorder often misuse other drugs.  °Alcohol use disorder is common and widespread. Some people with this disorder drink alcohol to cope with or escape from negative life events. Others drink to relieve chronic pain or symptoms of mental illness. People with a family history of alcohol use disorder are at higher risk of losing control and using alcohol to excess.  °SYMPTOMS  °Signs and symptoms of alcohol use disorder may include the following:  °· Consumption of alcohol in larger amounts or over a longer period of time than intended. °· Multiple unsuccessful attempts to cut down or control alcohol use.   °· A great deal of time spent obtaining alcohol, using alcohol, or recovering from the effects of alcohol (hangover). °· A strong desire or urge to use alcohol (cravings).   °· Continued use of alcohol despite problems at work, school, or home because of alcohol use.   °· Continued use of alcohol despite problems in relationships because of alcohol use. °· Continued use of alcohol in situations when it is physically hazardous, such as driving a car. °· Continued use of alcohol despite awareness of a physical or psychological problem that is likely related to alcohol use. Physical problems related to alcohol use can involve the brain, heart, liver, stomach, and intestines. Psychological problems related to alcohol use include intoxication, depression, anxiety, psychosis, delirium, and dementia.   °· The need for  increased amounts of alcohol to achieve the same desired effect, or a decreased effect from the consumption of the same amount of alcohol (tolerance). °· Withdrawal symptoms upon reducing or stopping alcohol use, or alcohol use to reduce or avoid withdrawal symptoms. Withdrawal symptoms include: °¨ Racing heart. °¨ Hand tremor. °¨ Difficulty sleeping. °¨ Nausea. °¨ Vomiting. °¨ Hallucinations. °¨ Restlessness. °¨ Seizures. °DIAGNOSIS °Alcohol use disorder is diagnosed through an assessment by your health care provider. Your health care provider may start by asking three or four questions to screen for excessive or problematic alcohol use. To confirm a diagnosis of alcohol use disorder, at least two symptoms must be present within a 12-month period. The severity of alcohol use disorder depends on the number of symptoms: °· Mild--two or three. °· Moderate--four or five. °· Severe--six or more. °Your health care provider may perform a physical exam or use results from lab tests to see if you have physical problems resulting from alcohol use. Your health care provider may refer you to a mental health professional for evaluation. °TREATMENT  °Some people with alcohol use disorder are able to reduce their alcohol use to low-risk levels. Some people with alcohol use disorder need to quit drinking alcohol. When necessary, mental health professionals with specialized training in substance use treatment can help. Your health care provider can help you decide how severe your alcohol use disorder is and what type of treatment you need. The following forms of treatment are available:  °· Detoxification. Detoxification involves the use of prescription medicines to prevent alcohol withdrawal symptoms in the first week after quitting. This is important for people with a history of symptoms   of withdrawal and for heavy drinkers who are likely to have withdrawal symptoms. Alcohol withdrawal can be dangerous and, in severe cases, cause  death. Detoxification is usually provided in a hospital or in-patient substance use treatment facility. °· Counseling or talk therapy. Talk therapy is provided by substance use treatment counselors. It addresses the reasons people use alcohol and ways to keep them from drinking again. The goals of talk therapy are to help people with alcohol use disorder find healthy activities and ways to cope with life stress, to identify and avoid triggers for alcohol use, and to handle cravings, which can cause relapse. °· Medicines. Different medicines can help treat alcohol use disorder through the following actions: °¨ Decrease alcohol cravings. °¨ Decrease the positive reward response felt from alcohol use. °¨ Produce an uncomfortable physical reaction when alcohol is used (aversion therapy). °· Support groups. Support groups are run by people who have quit drinking. They provide emotional support, advice, and guidance. °These forms of treatment are often combined. Some people with alcohol use disorder benefit from intensive combination treatment provided by specialized substance use treatment centers. Both inpatient and outpatient treatment programs are available. °Document Released: 11/01/2004 Document Revised: 02/08/2014 Document Reviewed: 01/01/2013 °ExitCare® Patient Information ©2015 [employer]. This information is not intended to replace advice given to you by your health care provider. Make sure you discuss any questions you have with your health care provider. ° ° ° ° °Emergency Department Resource Guide °1) Find a Doctor and Pay Out of Pocket °Although you won't have to find out who is covered by your insurance plan, it is a good idea to ask around and get recommendations. You will then need to call the office and see if the doctor you have chosen will accept you as a new patient and what types of options they offer for patients who are self-pay. Some doctors offer discounts or will set up payment plans for  their patients who do not have insurance, but you will need to ask so you aren't surprised when you get to your appointment. ° °2) Contact Your Local Health Department °Not all health departments have doctors that can see patients for sick visits, but many do, so it is worth a call to see if yours does. If you don't know where your local health department is, you can check in your phone book. The CDC also has a tool to help you locate your state's health department, and many state websites also have listings of all of their local health departments. ° °3) Find a Walk-in Clinic °If your illness is not likely to be very severe or complicated, you may want to try a walk in clinic. These are popping up all over the country in pharmacies, drugstores, and shopping centers. They're usually staffed by nurse practitioners or physician assistants that have been trained to treat common illnesses and complaints. They're usually fairly quick and inexpensive. However, if you have serious medical issues or chronic medical problems, these are probably not your best option. ° °No Primary Care Doctor: °- Call [facility] at  832-8000 - they can help you locate a primary care doctor that  accepts your insurance, provides certain services, etc. °- Physician Referral Service- 1-800-533-3463 ° °Chronic Pain Problems: °Organization         Address  Phone   Notes  °[facility]  (336) 297-2271 Patients need to be referred by their primary care doctor.  ° °Medication Assistance: °Organization           Address  Phone   Notes  °[place] [facility] [address] °[place], [place] [address] (336) 641-8030 --Must be a resident of [place] °-- Must have NO insurance coverage whatsoever (no Medicaid/ Medicare, etc.) °-- The pt. MUST have a primary care doctor that directs their care regularly and follows them in the community °  °[facility]  (866) 331-1348   °[employer]  (888)  892-1162   ° °Agencies that provide inexpensive medical care: °Organization         Address  Phone   Notes  °[facility]  (336) 832-8035   °[facility]    (336) 832-7272   °[facility] [address] °[place], [place] [address] (336) 832-4777   °[facility] [address], °[place] (336) 271-4999   °[facility]    (336) 373-0678   °[facility]    (336) 272-1050   °[facility] ° [address], [place] Phone:  (336) 832-4444, Fax:  (336) 832-4440 Hours of Operation:  9 am - 6 pm, M-F.  Also accepts Medicaid/Medicare and self-pay.  °[facility] ° [address], [place] Phone: (336) 832-3150, Fax: (336) 832-3151. Hours of Operation:  8:30 am - 5:30 pm, M-F.  Also accepts Medicaid and self-pay.  °[facility] [place] [address], [place] Phone: (336) 878-6027   °[facility] [address], [place], [place] (336)723-1848, Ext. 123 Mondays & Thursdays: 7-9 AM.  First 15 patients are seen on a first come, first serve basis. °  ° °Medicaid-accepting [place] Providers: ° °Organization         Address  Phone   Notes  °[facility] [address], [place] (336) 641-2100 Also accepts self-pay patients.  °[facility] [address], [place] ° (336) 856-9996   °[facility] [address], [place] (336) 288-8857   °[facility] [address] [place] [address], [place] (336) 299-7000   °Veita Bland [address], [place]  ° (336) 373-1557 Only accepts [place] Access Medicaid patients after they have their name applied to their card.  ° °Self-Pay (no insurance) in [place]: ° °Organization         Address  Phone   Notes  °Sickle Cell Patients, [facility] [address], [place] (336)  832-1970   °[facility] [address], [place] (336) 832-4400   °[facility] ° [address], [place] (336) 992-4800   °[facility]/Dr. Osei-Bonsu ° [address] [place] [address], [place] or [address], [place] (336) 841-8500 Phone number for both [place] and [place] locations is the same.  °[facility] [address], [place] (336) 299-0000   °[facility] [address] [place] [address], [place] or [address] (336) 852-7530 °(336) 878-2260   °[facility] [address],  (336) 350-1642, phone; (336) 294-5005, fax Sees patients 1st and 3rd Saturday of every month.  Must not qualify for public or private insurance (i.e. Medicaid, Medicare,  Health Choice, Veterans' Benefits) • Household income should be no more than 200% of the poverty level •The clinic cannot treat you if you are pregnant or think you   are pregnant • Sexually transmitted diseases are not treated at the clinic.  ° ° °Dental Care: °Organization         Address  Phone  Notes  °[facility] [address], [place] (336) 641-6152 Accepts children up to age 21 who are enrolled in Medicaid or [place] Health Choice; pregnant women with a Medicaid card; and children who have applied for Medicaid or [place] Health Choice, but were declined, whose parents can pay a reduced fee at time of service.  °[facility] [place]  [address], [place] (336) 641-7733 Accepts children up to age 21 who are enrolled in Medicaid or [place] Health Choice; pregnant women with a Medicaid card; and children who have applied for Medicaid or [place] Health Choice, but were declined, whose parents can pay a reduced fee at time of service.  °[facility] ° [address], [place] (336) 641-4533 Patients are  seen by appointment only. Walk-ins are not accepted. [facility] will see patients 18 years of age and older. °Monday - Tuesday (8am-5pm) °Most Wednesdays (8:30-5pm) °$30 per visit, cash only  °[facility] ° [address], [place] (336) 641-4533 Patients are seen by appointment only. Walk-ins are not accepted. [facility] will see patients 18 years of age and older. °One Wednesday Evening (Monthly: Volunteer Based).  $30 per visit, cash only  °[facility]  (919) 537-3737 for adults; Children under age 4, call Graduate Pediatric Dentistry at (919) [phone]. Children aged 4-14, please call (919) [phone] to request a pediatric application. ° Dental services are provided in all areas of dental care including fillings, crowns and bridges, complete and partial dentures, implants, gum treatment, root canals, and extractions. Preventive care is also provided. Treatment is provided to both adults and children. °Patients are selected via a lottery and there is often a waiting list. °  °[facility] [address], °[place] ° (336) 763-8833 [email or web address] °  °[facility] [address], [place], [place] (336)723-1848, Ext. 123 Second and Fourth Thursday of each month, opens at 6:30 AM; Clinic ends at 9 AM.  Patients are seen on a first-come first-served basis, and a limited number are seen during each clinic.  ° °[facility] ° [address], [place], [place] (336) 723-7904   Eligibility Requirements °You must have lived in [place], or [place] counties for at least the last three months. °  You cannot be eligible for state or federal sponsored healthcare insurance, including Veterans Administration, Medicaid, or Medicare. °  You generally cannot be eligible for healthcare insurance through your employer.  °  How to apply: °Eligibility screenings are held every Tuesday and Wednesday afternoon from 1:00 pm until 4:00  pm. You do not need an appointment for the interview!  °[facility] [address], [place], [place] 336-631-2330   °[facility]  336-342-8273   °[facility]  336-703-3100   °[facility]  336-570-6415   ° °Behavioral Health Resources in the Community: °Intensive Outpatient Programs °Organization         Address  Phone  Notes  °[facility] [address], [place], [place] 336-878-6098   °[facility] [address], [place], [place] 336-832-9800   °[facility] [address], [place], [place] ° 336-882-2125   °[facility] [address],  °[place], [place] 1-800-853-5163 or 336-641-4981   °Substance Abuse Resources °Organization         Address  Phone  Notes  °[facility]  336-882-2125   °[facility]  336-784-9470   °[facility]  336-285-9073   °[facility]  336-845-3988   °Residential & Outpatient Substance Abuse Program  1-800-659-3381   °Psychological Services °Organization         Address  Phone  Notes  °[facility]  336- 832-9600   °[facility]  336- 378-7881   °[facility] [address], [place] 1-800-853-5163 or 336-641-4981   ° °Mobile Crisis Teams °Organization         Address  Phone  Notes  °[facility], Mobile Crisis Care Unit  1-877-626-1772   °Assertive °[facility] ° [address] [place], [place] 336-834-9664   °Sharon DeEsch [address] °[place] 336-554-5454   ° °Self-Help/Support Groups °Organization         Address  Phone             Notes  °[facility] - variety of support groups  336- 373-1402 Call for more information  °Narcotics Anonymous (NA), [facility] [address], °[place]  2 meetings at this location  ° °Residential Treatment Programs °Organization          Address  Phone  Notes  °[facility] [address],    °[place]  1-866-801-8205   °[facility] ° [address], [place], [place] 704-293-8524   °[facility] [address], [place] 336-845-3988 Admissions: 8am-3pm M-F  °[facility] [address]    °[place], [place] 336-841-1104   °[facility] [address], [place], [place] 336-379-7146   °[facility] [address]  °[place], [place] 336-285-9073   °[facility] - Intensive Outpatient [address], [place], [place] 336-852-3033   °[facility] [address]  °[place], [place] 1-877-615-2722 or 336-784-9470   °[facility] [address] [place], [place] 336-227-7417 Accepts Medicaid  °[facility] [address]  °[place] 1-800-659-3381 Substance Abuse/Addiction Treatment  ° °[place] Behavioral Health Resources °Organization         Address  Phone  Notes  °[facility]  (888) 581-9988   °Julie Brannon, PhD [address] [place], [place]   (336) 349-5553 or (336) 951-0000   ° Behavioral   [address] °[place], [place] (336) 349-4454   °[facility] [address], [place], [place] (336) 342-8316 Insurance/Medicaid/sponsorship through [facility]  °[facility] [address]                                    [place], [place] (336) 342-8316 Therapy/tele-psych/case  °[facility] [address]  ° [place],  (336) 349-2233    °Dr. Arfeen  (336) 349-4544   °[facility] [place]  [employer] [facility] [place] [facility] 1) [address], [place] °2) [address], [place] °3)  [address], [place] (336) 349-3220 °(336) 342-7768 ° °(336) 342-8140   °[place] [facility] (336)   342-1394 or (336) 342-3537 (After Hours)    ° ° ° °

## 2014-05-12 NOTE — ED Notes (Signed)
Ginger ale given

## 2014-05-12 NOTE — ED Provider Notes (Signed)
CSN: [id number]     Arrival date & time 05/12/14  0741 History   First MD Initiated Contact with Patient 05/12/14 [phone]     Chief Complaint  Patient presents with  . Alcohol Problem     (Consider location/radiation/quality/duration/timing/severity/associated sxs/prior Treatment) Patient is a 48 y.o. female presenting with alcohol problem.  Alcohol Problem This is a recurrent problem. Episode onset: chronic. The problem occurs constantly. The problem has not changed since onset.Associated symptoms comments: vomiting. Nothing relieves the symptoms. Treatments tried: [facility], inpatient detox, AA. The treatment provided no relief.    Past Medical History  Diagnosis Date  . Peptic ulcer   . Alcohol abuse   . Anemia   . Benzodiazepine abuse   . Thyroid disease   . Alcoholism   . Narcotic abuse   . Back pain   . Thrombocytopenia 06/17/2011  . Hypothyroidism   . Seizures   . Medical history non-contributory     hypoglycemic   Past Surgical History  Procedure Laterality Date  . Cholecystectomy    . Abdominal surgery    . Esophagogastroduodenoscopy    . Gastric bypass    . Tubal ligation    . Esophagogastroduodenoscopy N/A 03/23/2013    Procedure: ESOPHAGOGASTRODUODENOSCOPY (EGD);  Surgeon: Ladene Artist, MD;  Location: [facility];  Service: Endoscopy;  Laterality: N/A;  . No past surgeries      gastric bypass   Family History  Problem Relation Age of Onset  . Alcohol abuse Father   . Alcoholism Father   . Cancer Other    History  Substance Use Topics  . Smoking status: Current Every Day Smoker -- 1.00 packs/day for 12 years    Types: Cigarettes    Last Attempt to Quit: 08/08/2011  . Smokeless tobacco: Never Used  . Alcohol Use: Yes     Comment: drink 4 liters a day. 1 case/day drinks Listerine   OB History   Grav Para Term Preterm Abortions TAB SAB Ect Mult Living   3 3  3      3      Review of Systems  All other systems reviewed and are negative.     Allergies   Nsaids  Home Medications   Prior to Admission medications   Medication Sig Start Date End Date Taking? Authorizing Provider  ARIPiprazole (ABILIFY) 5 MG tablet Take 1 tablet (5 mg total) by mouth daily. Mood control 12/23/13   Encarnacion Slates, NP  buPROPion (WELLBUTRIN XL) 300 MG 24 hr tablet Take 1 tablet (300 mg total) by mouth daily. For depression 12/23/13   Encarnacion Slates, NP  FLUoxetine (PROZAC) 40 MG capsule Take 1 capsule (40 mg total) by mouth daily. For depression 12/23/13   Encarnacion Slates, NP  gabapentin (NEURONTIN) 400 MG capsule Take 2 capsules (800 mg total) by mouth 3 (three) times daily. For substance withdrawal syndrome 12/23/13   Encarnacion Slates, NP  levothyroxine (SYNTHROID, LEVOTHROID) 125 MCG tablet Take 1 tablet (125 mcg total) by mouth daily before breakfast. For thyroid hormone replacement 12/23/13   Encarnacion Slates, NP  omeprazole (PRILOSEC) 20 MG capsule Take 1 capsule (20 mg total) by mouth daily. For acid reflux 12/23/13   Encarnacion Slates, NP  traZODone (DESYREL) 100 MG tablet Take 1 tablet (100 mg total) by mouth at bedtime as needed for sleep. 12/23/13   Encarnacion Slates, NP   BP 119/79  Pulse 94  Temp(Src) 98.1 F (36.7 C) (Oral)  Resp 12  SpO2 93%  LMP 05/04/2014 Physical Exam  Nursing note and vitals reviewed. Constitutional: She is oriented to person, place, and time. She appears well-developed and well-nourished. No distress.  HENT:  Head: Normocephalic and atraumatic.  Eyes: Conjunctivae are normal. No scleral icterus.  Neck: Neck supple.  Cardiovascular: Normal rate and intact distal pulses.   Pulmonary/Chest: Effort normal. No stridor. No respiratory distress.  Abdominal: Normal appearance. She exhibits no distension.  Neurological: She is alert and oriented to person, place, and time.  Skin: Skin is warm and dry. No rash noted.  Psychiatric: She has a normal mood and affect. She is withdrawn. She expresses no homicidal and no suicidal ideation.    ED Course   Procedures (including critical care time) Labs Review Labs Reviewed - No data to display  Imaging Review No results found.   EKG Interpretation None      MDM   Final diagnoses:  Alcohol abuse    48 yo female presenting with complaints of alcohol relapse and requesting help with her substance abuse.  She has been seen twice in the ED in the past week.  On both of these occasions, she requested inpatient treatment but ultimately changed her mind and requested discharge from the ED.  She did not appear clinically intoxicated.  She denied SI/HI/hallucinations.   She was offered resources for outpatient treatment options and she was agreeable to this plan.  However, she eloped from the ED prior to receiving a resource guide.      Houston Siren III, MD 05/12/14 [phone]

## 2014-05-12 NOTE — ED Notes (Signed)
Pt. Still c/o nausea. Will consult with EDP and medicate.

## 2014-05-12 NOTE — ED Provider Notes (Signed)
CSN: [id number]     Arrival date & time 05/12/14  1702 History   First MD Initiated Contact with Patient 05/12/14 1711     Chief Complaint  Patient presents with  . Suicidal   . Alcohol Intoxication     (Consider location/radiation/quality/duration/timing/severity/associated sxs/prior Treatment) HPI 48 year old female presents by EMS when being found intoxicated. She apparently called because she was suicidal and made threats of running into traffic. Also apparently made threats of jumping off a bridge. Patient states that she did say these things and she has been feeling suicidal recently due to her life falling apart. She drinks alcohol every day. She was seen at [facility] earlier this morning and recommended outpatient treatment. She stated that she wants inpatient treatment. Patient apparently drank a large amount of hand sanitizer prior to arrival. She states she has not drunk because she is still awake. She is requesting medicine for her nerves through the CIWA protocol.  Past Medical History  Diagnosis Date  . Peptic ulcer   . Alcohol abuse   . Anemia   . Benzodiazepine abuse   . Thyroid disease   . Alcoholism   . Narcotic abuse   . Back pain   . Thrombocytopenia 06/17/2011  . Hypothyroidism   . Seizures   . Medical history non-contributory     hypoglycemic   Past Surgical History  Procedure Laterality Date  . Cholecystectomy    . Abdominal surgery    . Esophagogastroduodenoscopy    . Gastric bypass    . Tubal ligation    . Esophagogastroduodenoscopy N/A 03/23/2013    Procedure: ESOPHAGOGASTRODUODENOSCOPY (EGD);  Surgeon: Ladene Artist, MD;  Location: [facility];  Service: Endoscopy;  Laterality: N/A;  . No past surgeries      gastric bypass   Family History  Problem Relation Age of Onset  . Alcohol abuse Father   . Alcoholism Father   . Cancer Other    History  Substance Use Topics  . Smoking status: Current Every Day Smoker -- 1.00 packs/day for 12 years    Types: Cigarettes    Last Attempt to Quit: 08/08/2011  . Smokeless tobacco: Never Used  . Alcohol Use: Yes     Comment: drink 4 liters a day. 1 case/day drinks Listerine   OB History   Grav Para Term Preterm Abortions TAB SAB Ect Mult Living   3 3  3      3      Review of Systems  Psychiatric/Behavioral: Positive for suicidal ideas. Negative for self-injury. The patient is nervous/anxious.       Allergies  Nsaids  Home Medications   Prior to Admission medications   Medication Sig Start Date End Date Taking? Authorizing Provider  ARIPiprazole (ABILIFY) 5 MG tablet Take 1 tablet (5 mg total) by mouth daily. Mood control 12/23/13   Encarnacion Slates, NP  buPROPion (WELLBUTRIN XL) 300 MG 24 hr tablet Take 1 tablet (300 mg total) by mouth daily. For depression 12/23/13   Encarnacion Slates, NP  FLUoxetine (PROZAC) 40 MG capsule Take 1 capsule (40 mg total) by mouth daily. For depression 12/23/13   Encarnacion Slates, NP  gabapentin (NEURONTIN) 400 MG capsule Take 2 capsules (800 mg total) by mouth 3 (three) times daily. For substance withdrawal syndrome 12/23/13   Encarnacion Slates, NP  levothyroxine (SYNTHROID, LEVOTHROID) 125 MCG tablet Take 1 tablet (125 mcg total) by mouth daily before breakfast. For thyroid hormone replacement 12/23/13   Encarnacion Slates,  NP  omeprazole (PRILOSEC) 20 MG capsule Take 1 capsule (20 mg total) by mouth daily. For acid reflux 12/23/13   Encarnacion Slates, NP  traZODone (DESYREL) 100 MG tablet Take 1 tablet (100 mg total) by mouth at bedtime as needed for sleep. 12/23/13   Encarnacion Slates, NP   BP 126/84  Pulse 107  Temp(Src) 98.9 F (37.2 C) (Oral)  Resp 16  SpO2 97%  LMP 05/04/2014 Physical Exam  Nursing note and vitals reviewed. Constitutional: She is oriented to person, place, and time. She appears well-developed and well-nourished.  intoxicated  HENT:  Head: Normocephalic and atraumatic.  Right Ear: External ear normal.  Left Ear: External ear normal.  Nose: Nose normal.   Eyes: Right eye exhibits no discharge. Left eye exhibits no discharge.  Cardiovascular: Normal rate, regular rhythm and normal heart sounds.   Pulmonary/Chest: Effort normal and breath sounds normal.  Abdominal: Soft. She exhibits no distension. There is no tenderness.  Neurological: She is alert and oriented to person, place, and time.  Skin: Skin is warm and dry.  Psychiatric: She expresses suicidal ideation.    ED Course  Procedures (including critical care time) Labs Review Labs Reviewed  CBC - Abnormal; Notable for the following:    Hemoglobin 11.2 (*)    MCH 25.5 (*)    RDW 20.8 (*)    Platelets 454 (*)    All other components within normal limits  ETHANOL - Abnormal; Notable for the following:    Alcohol, Ethyl (B) 285 (*)    All other components within normal limits  COMPREHENSIVE METABOLIC PANEL - Abnormal; Notable for the following:    Calcium 8.2 (*)    Albumin 3.4 (*)    AST 144 (*)    ALT 96 (*)    Alkaline Phosphatase 118 (*)    Total Bilirubin 0.2 (*)    GFR calc non Af Amer 86 (*)    Anion gap 21 (*)    All other components within normal limits  SALICYLATE LEVEL - Abnormal; Notable for the following:    Salicylate Lvl <6.4 (*)    All other components within normal limits  ACETAMINOPHEN LEVEL  URINE RAPID DRUG SCREEN (HOSP PERFORMED)    Imaging Review No results found.   EKG Interpretation None      MDM   Final diagnoses:  Alcohol intoxication, uncomplicated  Depression    Given the patient's remarks and threats about suicide I have involuntarily committed her. Patient is intoxicated this time. Psychiatry is evaluated patient and she meets inpatient criteria and they will begin to look for placement. Placed on CIWA protocol and will keep in psych ED until placement is found.    Ephraim Hamburger, MD 05/12/14 2059

## 2014-05-13 ENCOUNTER — Encounter (HOSPITAL_COMMUNITY): Payer: Self-pay | Admitting: Registered Nurse

## 2014-05-13 ENCOUNTER — Observation Stay (HOSPITAL_COMMUNITY)
Admission: AD | Admit: 2014-05-13 | Payer: No Typology Code available for payment source | Source: Intra-hospital | Admitting: Psychiatry

## 2014-05-13 DIAGNOSIS — F1994 Other psychoactive substance use, unspecified with psychoactive substance-induced mood disorder: Secondary | ICD-10-CM

## 2014-05-13 DIAGNOSIS — F4310 Post-traumatic stress disorder, unspecified: Secondary | ICD-10-CM

## 2014-05-13 DIAGNOSIS — F1010 Alcohol abuse, uncomplicated: Secondary | ICD-10-CM

## 2014-05-13 LAB — RAPID URINE DRUG SCREEN, HOSP PERFORMED
AMPHETAMINES: NOT DETECTED
BARBITURATES: NOT DETECTED
BENZODIAZEPINES: NOT DETECTED
COCAINE: NOT DETECTED
Opiates: NOT DETECTED
TETRAHYDROCANNABINOL: NOT DETECTED

## 2014-05-13 MED ORDER — LEVOTHYROXINE SODIUM 125 MCG PO TABS
125.0000 ug | ORAL_TABLET | Freq: Every day | ORAL | Status: DC
  Filled 2014-05-13 (×2): qty 1

## 2014-05-13 MED ORDER — PANTOPRAZOLE SODIUM 40 MG PO TBEC: 40.0000 mg | DELAYED_RELEASE_TABLET | Freq: Every day | ORAL | Status: DC

## 2014-05-13 MED ORDER — LORAZEPAM 1 MG PO TABS: 0.0000 mg | ORAL_TABLET | Freq: Four times a day (QID) | ORAL | Status: DC

## 2014-05-13 MED ORDER — FLUOXETINE HCL 20 MG PO CAPS
60.0000 mg | ORAL_CAPSULE | Freq: Every day | ORAL | Status: DC
  Filled 2014-05-13: qty 3

## 2014-05-13 MED ORDER — ADULT MULTIVITAMIN W/MINERALS CH: 1.0000 | ORAL_TABLET | Freq: Every day | ORAL | Status: DC

## 2014-05-13 MED ORDER — ONDANSETRON 4 MG PO TBDP
8.0000 mg | ORAL_TABLET | Freq: Three times a day (TID) | ORAL | Status: DC | PRN
  Administered 2014-05-13 (×2): 8 mg via ORAL
  Filled 2014-05-13 (×2): qty 2

## 2014-05-13 MED ORDER — LORAZEPAM 1 MG PO TABS: 0.0000 mg | ORAL_TABLET | Freq: Two times a day (BID) | ORAL | Status: DC

## 2014-05-13 MED ORDER — LORAZEPAM 1 MG PO TABS: 1.0000 mg | ORAL_TABLET | Freq: Four times a day (QID) | ORAL | Status: DC | PRN

## 2014-05-13 MED ORDER — BUPROPION HCL ER (XL) 300 MG PO TB24
300.0000 mg | ORAL_TABLET | Freq: Every day | ORAL | Status: DC
  Filled 2014-05-13: qty 1

## 2014-05-13 MED ORDER — FOLIC ACID 1 MG PO TABS: 1.0000 mg | ORAL_TABLET | Freq: Every day | ORAL | Status: DC

## 2014-05-13 MED ORDER — LORAZEPAM 2 MG/ML IJ SOLN: 1.0000 mg | Freq: Four times a day (QID) | INTRAMUSCULAR | Status: DC | PRN

## 2014-05-13 NOTE — Consult Note (Signed)
  Patient assessed by TTS and agree with assessment.  Patient states that she is not suicidal and "I was not going to jump off of a bridge.  I was going under the bridge to drink when the police came.  I do need to stop drinking and I want to stop drinking,"  Patient states that she drinks about 4 qt. Beer daily.  Patient states that she is not feeling good since she stopped drinking yesterday.    Recommend 24 hour observation and detox.  Will continue the Ativan detox protocol related to elevated AST/ALT.  Patient complains of pain with urination,  UA ordered  Patient has been accepted to [facility] Bed 2.  Will continue to monitor for safety and stabilization until transferred.    Bahja Bence B. Karalyn Kadel FNP-BC

## 2014-05-13 NOTE — ED Notes (Signed)
Pt. C/o nausea. EDP consulted and med given.
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
## 2014-05-13 NOTE — Consult Note (Signed)
Face to face evaluation and I agree with this note 

## 2014-05-13 NOTE — [facility] Suicide Risk Assessment (Cosign Needed)
Suicide Risk Assessment  Discharge Assessment     Demographic Factors:  Caucasian  Total Time spent with patient: 20 minutes  Psychiatric Specialty Exam:     Blood pressure 121/71, pulse 78, temperature 98.6 F (37 C), temperature source Oral, resp. rate 18, last menstrual period 05/04/2014, SpO2 98.00%.There is no weight on file to calculate BMI.   General Appearance: Disheveled   Eye Contact:: Good   Speech: Clear and Coherent and Normal Rate   Volume: Normal   Mood: Anxious   Affect: Congruent   Thought Process: Circumstantial   Orientation: Full (Time, Place, and Person)   Thought Content: Rumination   Suicidal Thoughts: No   Homicidal Thoughts: No   Memory: Immediate; Good  Recent; Good  Remote; Good   Judgement: Fair   Insight: Lacking   Psychomotor Activity: Normal   Concentration: Fair   Recall: Good   Fund of Knowledge:Good   Language: Good   Akathisia: No   Handed: Right   AIMS (if indicated):   Assets: Communication Skills  Desire for Improvement   Sleep:   Musculoskeletal:  Strength & Muscle Tone: within normal limits  Gait & Station: normal  Patient leans: N/A  Mental Status Per Nursing Assessment::   On Admission:     Current Mental Status by Physician: Patient denies suicidal/homicidal ideation, psychosis, and paranoia  Loss Factors: NA  Historical Factors:  Family History  Problem Relation Age of Onset  . Alcohol abuse Father   . Alcoholism Father   . Cancer Other     Risk Reduction Factors:   Positive social support  Continued Clinical Symptoms:  Alcohol/Substance Abuse/Dependencies  Cognitive Features That Contribute To Risk:  None noted    Suicide Risk:  Minimal: No identifiable suicidal ideation.  Patients presenting with no risk factors but with morbid ruminations; may be classified as minimal risk based on the severity of the depressive symptoms  Discharge Diagnoses:  AXIS I: Alcohol Abuse, Post Traumatic Stress  Disorder and Substance Induced Mood Disorder  AXIS II: Deferred  AXIS III:  Past Medical History   Diagnosis  Date   .  Peptic ulcer    .  Alcohol abuse    .  Anemia    .  Benzodiazepine abuse    .  Thyroid disease    .  Alcoholism    .  Narcotic abuse    .  Back pain    .  Thrombocytopenia  06/17/2011   .  Hypothyroidism    .  Seizures    .  Medical history non-contributory      hypoglycemic    AXIS IV: other psychosocial or environmental problems  AXIS V: 61-70 mild symptoms  Plan Of Care/Follow-up recommendations:  Activity:  As tolerated Diet:  As tolerated  Is patient on multiple antipsychotic therapies at discharge:  No   Has Patient had three or more failed trials of antipsychotic monotherapy by history:  No  Recommended Plan for Multiple Antipsychotic Therapies: NA    Estreya Clay, FNP-BC 05/13/2014, 12:00 PM

## 2014-05-13 NOTE — BH Assessment (Signed)
Pt support paperwork completed. Pt accepted to Obs bed #4 per Dr.Taylor and Shuvon,NP. Pt's ED nurse Seth Bake notified of this.   Shaune Pollack, MS, [facility] Assessment Counselor

## 2014-05-13 NOTE — ED Notes (Signed)
D/C instructions/meds/follow-up appointments reviewed, pt verbalized understanding, pt's belongings returned to pt.

## 2014-05-13 NOTE — ED Notes (Signed)
Pt. Given ice chips.

## 2014-05-13 NOTE — ED Notes (Signed)
Pt came up to nurse's station states "I do not want to go to observation unit at behavioral health, I want to be discharged.", MD and NP notified.

## 2014-05-16 ENCOUNTER — Emergency Department: Payer: Self-pay | Admitting: Emergency Medicine

## 2014-05-16 LAB — CBC
HCT: 32.4 % — ABNORMAL LOW (ref 35.0–47.0)
HGB: 10.1 g/dL — ABNORMAL LOW (ref 12.0–16.0)
MCH: 26.8 pg (ref 26.0–34.0)
MCHC: 31.2 g/dL — AB (ref 32.0–36.0)
MCV: 86 fL (ref 80–100)
PLATELETS: 230 10*3/uL (ref 150–440)
RBC: 3.77 10*6/uL — ABNORMAL LOW (ref 3.80–5.20)
RDW: 23.2 % — AB (ref 11.5–14.5)
WBC: 4.9 10*3/uL (ref 3.6–11.0)

## 2014-05-17 LAB — URINALYSIS, COMPLETE
Bilirubin,UR: NEGATIVE
Blood: NEGATIVE
Glucose,UR: NEGATIVE mg/dL (ref 0–75)
Ketone: NEGATIVE
Nitrite: POSITIVE
Ph: 6 (ref 4.5–8.0)
Protein: NEGATIVE
RBC,UR: 3 /HPF (ref 0–5)
Specific Gravity: 1.011 (ref 1.003–1.030)
Squamous Epithelial: 4
WBC UR: 47 /HPF (ref 0–5)

## 2014-05-17 LAB — COMPREHENSIVE METABOLIC PANEL WITH GFR
Albumin: 3.3 g/dL — ABNORMAL LOW (ref 3.4–5.0)
Alkaline Phosphatase: 107 U/L
Anion Gap: 9 (ref 7–16)
BUN: 10 mg/dL (ref 7–18)
Bilirubin,Total: 0.2 mg/dL (ref 0.2–1.0)
Calcium, Total: 7.5 mg/dL — ABNORMAL LOW (ref 8.5–10.1)
Chloride: 106 mmol/L (ref 98–107)
Co2: 27 mmol/L (ref 21–32)
Creatinine: 0.96 mg/dL (ref 0.60–1.30)
EGFR (African American): >60
EGFR (Non-African Amer.): >60
Glucose: 111 mg/dL — ABNORMAL HIGH (ref 65–99)
Osmolality: 283 (ref 275–301)
Potassium: 3.5 mmol/L (ref 3.5–5.1)
SGOT(AST): 100 U/L — ABNORMAL HIGH (ref 15–37)
SGPT (ALT): 93 U/L — ABNORMAL HIGH
Sodium: 142 mmol/L (ref 136–145)
Total Protein: 7 g/dL (ref 6.4–8.2)

## 2014-05-17 LAB — DRUG SCREEN, URINE
Amphetamines, Ur Screen: NEGATIVE (ref ?–1000)
Barbiturates, Ur Screen: NEGATIVE (ref ?–200)
Benzodiazepine, Ur Scrn: NEGATIVE (ref ?–200)
Cannabinoid 50 Ng, Ur ~~LOC~~: NEGATIVE (ref ?–50)
Cocaine Metabolite,Ur ~~LOC~~: POSITIVE (ref ?–300)
MDMA (Ecstasy)Ur Screen: NEGATIVE (ref ?–500)
Methadone, Ur Screen: NEGATIVE (ref ?–300)
Opiate, Ur Screen: NEGATIVE (ref ?–300)
Phencyclidine (PCP) Ur S: NEGATIVE (ref ?–25)
Tricyclic, Ur Screen: NEGATIVE (ref ?–1000)

## 2014-05-17 LAB — ETHANOL
ETHANOL %: 0.292 % — AB (ref 0.000–0.080)
Ethanol %: <0.003 % (ref 0.000–0.080)
Ethanol: 292 mg/dL

## 2014-05-17 LAB — ACETAMINOPHEN LEVEL: Acetaminophen: <2 ug/mL

## 2014-05-17 LAB — SALICYLATE LEVEL: Salicylates, Serum: 2.3 mg/dL

## 2014-05-25 ENCOUNTER — Encounter (HOSPITAL_COMMUNITY): Payer: Self-pay | Admitting: Emergency Medicine

## 2014-05-25 ENCOUNTER — Emergency Department (HOSPITAL_COMMUNITY)
Admission: EM | Admit: 2014-05-25 | Discharge: 2014-05-28 | Disposition: A | Discharge status: Other Acute Inpatient Hospital | Payer: No Typology Code available for payment source | Attending: Emergency Medicine | Admitting: Emergency Medicine

## 2014-05-25 DIAGNOSIS — Z008 Encounter for other general examination: Secondary | ICD-10-CM | POA: Insufficient documentation

## 2014-05-25 DIAGNOSIS — F1010 Alcohol abuse, uncomplicated: Secondary | ICD-10-CM | POA: Insufficient documentation

## 2014-05-25 DIAGNOSIS — R45851 Suicidal ideations: Secondary | ICD-10-CM | POA: Insufficient documentation

## 2014-05-25 DIAGNOSIS — Z8719 Personal history of other diseases of the digestive system: Secondary | ICD-10-CM | POA: Insufficient documentation

## 2014-05-25 DIAGNOSIS — Z79899 Other long term (current) drug therapy: Secondary | ICD-10-CM | POA: Insufficient documentation

## 2014-05-25 DIAGNOSIS — F411 Generalized anxiety disorder: Secondary | ICD-10-CM | POA: Insufficient documentation

## 2014-05-25 DIAGNOSIS — F10929 Alcohol use, unspecified with intoxication, unspecified: Secondary | ICD-10-CM

## 2014-05-25 DIAGNOSIS — E079 Disorder of thyroid, unspecified: Secondary | ICD-10-CM | POA: Insufficient documentation

## 2014-05-25 DIAGNOSIS — Z8669 Personal history of other diseases of the nervous system and sense organs: Secondary | ICD-10-CM | POA: Insufficient documentation

## 2014-05-25 DIAGNOSIS — Z862 Personal history of diseases of the blood and blood-forming organs and certain disorders involving the immune mechanism: Secondary | ICD-10-CM | POA: Insufficient documentation

## 2014-05-25 DIAGNOSIS — Z3202 Encounter for pregnancy test, result negative: Secondary | ICD-10-CM | POA: Insufficient documentation

## 2014-05-25 DIAGNOSIS — F1410 Cocaine abuse, uncomplicated: Secondary | ICD-10-CM | POA: Insufficient documentation

## 2014-05-25 DIAGNOSIS — F1021 Alcohol dependence, in remission: Secondary | ICD-10-CM | POA: Insufficient documentation

## 2014-05-25 DIAGNOSIS — R197 Diarrhea, unspecified: Secondary | ICD-10-CM | POA: Insufficient documentation

## 2014-05-25 LAB — COMPREHENSIVE METABOLIC PANEL
ALBUMIN: 4 g/dL (ref 3.5–5.2)
ALT: 49 U/L — ABNORMAL HIGH (ref 0–35)
ANION GAP: 15 (ref 5–15)
AST: 57 U/L — ABNORMAL HIGH (ref 0–37)
Alkaline Phosphatase: 96 U/L (ref 39–117)
BUN: 9 mg/dL (ref 6–23)
CO2: 25 mEq/L (ref 19–32)
Calcium: 8.3 mg/dL — ABNORMAL LOW (ref 8.4–10.5)
Chloride: 101 mEq/L (ref 96–112)
Creatinine, Ser: 0.76 mg/dL (ref 0.50–1.10)
GFR calc Af Amer: >90 mL/min (ref >90)
GFR calc non Af Amer: >90 mL/min (ref >90)
GLUCOSE: 95 mg/dL (ref 70–99)
Potassium: 3.9 mEq/L (ref 3.7–5.3)
Sodium: 141 mEq/L (ref 137–147)
TOTAL PROTEIN: 7.4 g/dL (ref 6.0–8.3)
Total Bilirubin: 0.2 mg/dL — ABNORMAL LOW (ref 0.3–1.2)

## 2014-05-25 LAB — ACETAMINOPHEN LEVEL

## 2014-05-25 LAB — CBC WITH DIFFERENTIAL/PLATELET
BASOS ABS: 0.1 10*3/uL (ref 0.0–0.1)
Basophils Relative: 3 % — ABNORMAL HIGH (ref 0–1)
Eosinophils Absolute: 0.1 10*3/uL (ref 0.0–0.7)
Eosinophils Relative: 3 % (ref 0–5)
HCT: 32.4 % — ABNORMAL LOW (ref 36.0–46.0)
Hemoglobin: 10.1 g/dL — ABNORMAL LOW (ref 12.0–15.0)
LYMPHS ABS: 1.1 10*3/uL (ref 0.7–4.0)
Lymphocytes Relative: 31 % (ref 12–46)
MCH: 27.1 pg (ref 26.0–34.0)
MCHC: 31.2 g/dL (ref 30.0–36.0)
MCV: 86.9 fL (ref 78.0–100.0)
MONO ABS: 0.8 10*3/uL (ref 0.1–1.0)
MONOS PCT: 22 % — AB (ref 3–12)
Neutro Abs: 1.5 10*3/uL — ABNORMAL LOW (ref 1.7–7.7)
Neutrophils Relative %: 41 % — ABNORMAL LOW (ref 43–77)
Platelets: 387 10*3/uL (ref 150–400)
RBC: 3.73 MIL/uL — ABNORMAL LOW (ref 3.87–5.11)
RDW: 22.2 % — AB (ref 11.5–15.5)
WBC: 3.6 10*3/uL — AB (ref 4.0–10.5)

## 2014-05-25 LAB — RAPID URINE DRUG SCREEN, HOSP PERFORMED
AMPHETAMINES: NOT DETECTED
BENZODIAZEPINES: NOT DETECTED
Barbiturates: NOT DETECTED
Cocaine: POSITIVE — AB
Opiates: NOT DETECTED
Tetrahydrocannabinol: NOT DETECTED

## 2014-05-25 LAB — SALICYLATE LEVEL: Salicylate Lvl: <2 mg/dL — ABNORMAL LOW (ref 2.8–20.0)

## 2014-05-25 LAB — ETHANOL: Alcohol, Ethyl (B): 312 mg/dL — ABNORMAL HIGH (ref 0–11)

## 2014-05-25 MED ORDER — LORAZEPAM 1 MG PO TABS
0.0000 mg | ORAL_TABLET | Freq: Four times a day (QID) | ORAL | Status: AC
  Administered 2014-05-25: 1 mg via ORAL
  Administered 2014-05-26: 2 mg via ORAL
  Administered 2014-05-26: 1 mg via ORAL
  Administered 2014-05-26 – 2014-05-27 (×3): 2 mg via ORAL
  Administered 2014-05-27: 1 mg via ORAL
  Filled 2014-05-25 (×2): qty 2
  Filled 2014-05-25: qty 1
  Filled 2014-05-25 (×4): qty 2
  Filled 2014-05-25: qty 1

## 2014-05-25 MED ORDER — LORAZEPAM 1 MG PO TABS
0.0000 mg | ORAL_TABLET | Freq: Two times a day (BID) | ORAL | Status: DC
  Administered 2014-05-26: 2 mg via ORAL
  Administered 2014-05-27: 1 mg via ORAL
  Filled 2014-05-25: qty 1

## 2014-05-25 MED ORDER — VITAMIN B-1 100 MG PO TABS
100.0000 mg | ORAL_TABLET | Freq: Every day | ORAL | Status: DC
Start: 2014-05-25 — End: 2014-05-28
  Administered 2014-05-25 – 2014-05-27 (×3): 100 mg via ORAL
  Filled 2014-05-25 (×3): qty 1

## 2014-05-25 MED ORDER — THIAMINE HCL 100 MG/ML IJ SOLN: 100.0000 mg | Freq: Every day | INTRAMUSCULAR | Status: DC

## 2014-05-25 NOTE — ED Provider Notes (Signed)
TIME SEEN: 8:05 PM  CHIEF COMPLAINT: Intoxicated, suicidal thoughts  HPI: Patient is a 48 y.o. F with history of alcohol abuse, opiate and benzodiazepine abuse who presents to the emergency department after she was found intoxicated by police. EMS was called she was brought to the emergency department. She states she wants detox. States "don't kick me out again". She states "I just want to die", "just let me die". She denies any current pain.  ROS: See HPI Constitutional: no fever  Eyes: no drainage  ENT: no runny nose   Cardiovascular:  no chest pain  Resp: no SOB  GI: no vomiting GU: no dysuria Integumentary: no rash  Allergy: no hives  Musculoskeletal: no leg swelling  Neurological: no slurred speech ROS otherwise negative  PAST MEDICAL HISTORY/PAST SURGICAL HISTORY:  Past Medical History  Diagnosis Date  . Peptic ulcer   . Alcohol abuse   . Anemia   . Benzodiazepine abuse   . Thyroid disease   . Alcoholism   . Narcotic abuse   . Back pain   . Thrombocytopenia 06/17/2011  . Hypothyroidism   . Seizures   . Medical history non-contributory     hypoglycemic    MEDICATIONS:  Prior to Admission medications   Medication Sig Start Date End Date Taking? Authorizing Provider  buPROPion (WELLBUTRIN XL) 300 MG 24 hr tablet Take 1 tablet (300 mg total) by mouth daily. For depression 12/23/13   Encarnacion Slates, NP  FLUoxetine (PROZAC) 20 MG capsule Take 60 mg by mouth daily.    Historical Provider, MD  levothyroxine (SYNTHROID, LEVOTHROID) 125 MCG tablet Take 1 tablet (125 mcg total) by mouth daily before breakfast. For thyroid hormone replacement 12/23/13   Encarnacion Slates, NP  omeprazole (PRILOSEC) 20 MG capsule Take 1 capsule (20 mg total) by mouth daily. For acid reflux 12/23/13   Encarnacion Slates, NP    ALLERGIES:  Allergies  Allergen Reactions  . Nsaids Other (See Comments)    G.I. Bleed    SOCIAL HISTORY:  History  Substance Use Topics  . Smoking status: Current Every Day  Smoker -- 1.00 packs/day for 12 years    Types: Cigarettes    Last Attempt to Quit: 08/08/2011  . Smokeless tobacco: Never Used  . Alcohol Use: Yes     Comment: drink 4 liters a day. 1 case/day drinks Listerine    FAMILY HISTORY: Family History  Problem Relation Age of Onset  . Alcohol abuse Father   . Alcoholism Father   . Cancer Other     EXAM: LMP 05/04/2014 CONSTITUTIONAL: Alert and oriented and responds appropriately to questions intermittently. Disheveled, smells of alcohol HEAD: Normocephalic EYES: Conjunctivae clear, PERRL ENT: normal nose; no rhinorrhea; moist mucous membranes; pharynx without lesions noted NECK: Supple, no meningismus, no LAD  CARD: RRR; S1 and S2 appreciated; no murmurs, no clicks, no rubs, no gallops RESP: Normal chest excursion without splinting or tachypnea; breath sounds clear and equal bilaterally; no wheezes, no rhonchi, no rales,  ABD/GI: Normal bowel sounds; non-distended; soft, non-tender, no rebound, no guarding BACK:  The back appears normal and is non-tender to palpation, there is no CVA tenderness EXT: Normal ROM in all joints; non-tender to palpation; no edema; normal capillary refill; no cyanosis    SKIN: Normal color for age and race; warm NEURO: Moves all extremities equally, normal gait, sensation to light touch intact diffusely, no obvious cranial nerve deficit PSYCH: Patient has bizarre affect, disheveled appearing, appears intoxicated  MEDICAL DECISION  MAKING: Patient here after she was found walking on the side of the road by the [facility]. She is intoxicated. She was brought in by EMS. She endorses suicidal thoughts without plan and is unable to contract for safety. Requesting detox but appears clinically intoxicated. We'll reevaluate when clinically sober.  ED PROGRESS: Patient's labs are unremarkable other than alcohol level of 312. She is now attempting to leave the room is able to contract for safety. We'll involuntarily  committed. She'll need to be reassessed when she is clinically sober to assess if she is still having suicidal thoughts or requesting alcohol detox.     [place], DO 05/25/14 [id number]
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
## 2014-05-26 LAB — PREGNANCY, URINE: Preg Test, Ur: NEGATIVE

## 2014-05-26 MED ORDER — CARBAMAZEPINE ER 200 MG PO TB12
300.0000 mg | ORAL_TABLET | Freq: Two times a day (BID) | ORAL | Status: DC
  Filled 2014-05-26 (×3): qty 1

## 2014-05-26 MED ORDER — PANTOPRAZOLE SODIUM 40 MG PO TBEC
40.0000 mg | DELAYED_RELEASE_TABLET | Freq: Every day | ORAL | Status: DC
  Administered 2014-05-26 – 2014-05-27 (×2): 40 mg via ORAL
  Filled 2014-05-26 (×2): qty 1

## 2014-05-26 MED ORDER — LEVOTHYROXINE SODIUM 125 MCG PO TABS
125.0000 ug | ORAL_TABLET | Freq: Every day | ORAL | Status: DC
  Administered 2014-05-26 – 2014-05-27 (×2): 125 ug via ORAL
  Filled 2014-05-26 (×6): qty 1

## 2014-05-26 MED ORDER — ACETAMINOPHEN 500 MG PO TABS
1000.0000 mg | ORAL_TABLET | Freq: Once | ORAL | Status: AC
  Administered 2014-05-26: 1000 mg via ORAL
  Filled 2014-05-26: qty 2

## 2014-05-26 MED ORDER — ONDANSETRON 4 MG PO TBDP
4.0000 mg | ORAL_TABLET | Freq: Once | ORAL | Status: AC
  Administered 2014-05-26: 4 mg via ORAL
  Filled 2014-05-26: qty 1

## 2014-05-26 MED ORDER — ACETAMINOPHEN 325 MG PO TABS
650.0000 mg | ORAL_TABLET | Freq: Four times a day (QID) | ORAL | Status: DC | PRN
Start: 2014-05-26 — End: 2014-05-28
  Administered 2014-05-26 – 2014-05-27 (×2): 650 mg via ORAL
  Filled 2014-05-26 (×2): qty 2

## 2014-05-26 MED ORDER — CARBAMAZEPINE ER 100 MG PO TB12
300.0000 mg | ORAL_TABLET | Freq: Two times a day (BID) | ORAL | Status: DC
  Administered 2014-05-26 – 2014-05-27 (×4): 300 mg via ORAL
  Filled 2014-05-26 (×9): qty 3

## 2014-05-26 MED ORDER — FLUOXETINE HCL 20 MG PO CAPS
60.0000 mg | ORAL_CAPSULE | Freq: Every day | ORAL | Status: DC
  Administered 2014-05-26 – 2014-05-27 (×2): 60 mg via ORAL
  Filled 2014-05-26 (×2): qty 3

## 2014-05-26 MED ORDER — NICOTINE 21 MG/24HR TD PT24
21.0000 mg | MEDICATED_PATCH | Freq: Once | TRANSDERMAL | Status: AC
Start: 2014-05-26 — End: 2014-05-27
  Administered 2014-05-26: 21 mg via TRANSDERMAL
  Filled 2014-05-26: qty 1

## 2014-05-26 NOTE — ED Notes (Signed)
Pt ambulatory to the bathroom with tech, pt complaining of nausea.

## 2014-05-26 NOTE — Progress Notes (Signed)
Writer faxed the referral to the following hospitals: [facility]  [place]
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
## 2014-05-26 NOTE — Progress Notes (Signed)
Tele Assessment complete.

## 2014-05-26 NOTE — ED Notes (Signed)
Pt. Got hand sanitizer from wall and ate it. EDP notified. Hand sanitizer removed from pt. Area.
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
## 2014-05-26 NOTE — Progress Notes (Signed)
Per, [name] PA - patient meets criteria for inpatient hospitalization.  Per Herbert Spires [facility]) no beds at [facility].  Patient will be referred to other hospitals.

## 2014-05-26 NOTE — ED Notes (Signed)
Pt. Reports drinking Listerine earlier, pt reports drinking "a quart of liquor" . Pt. Reports using cocaine yesterday.

## 2014-05-27 DIAGNOSIS — R45851 Suicidal ideations: Secondary | ICD-10-CM

## 2014-05-27 DIAGNOSIS — F1994 Other psychoactive substance use, unspecified with psychoactive substance-induced mood disorder: Secondary | ICD-10-CM

## 2014-05-27 DIAGNOSIS — F4310 Post-traumatic stress disorder, unspecified: Secondary | ICD-10-CM

## 2014-05-27 DIAGNOSIS — F1010 Alcohol abuse, uncomplicated: Secondary | ICD-10-CM

## 2014-05-27 MED ORDER — CARBAMAZEPINE ER 100 MG PO TB12
ORAL_TABLET | ORAL | Status: AC
  Filled 2014-05-27: qty 3

## 2014-05-27 MED ORDER — NICOTINE 14 MG/24HR TD PT24
14.0000 mg | MEDICATED_PATCH | Freq: Every day | TRANSDERMAL | Status: DC
  Administered 2014-05-27: 14 mg via TRANSDERMAL
  Filled 2014-05-27: qty 1

## 2014-05-27 MED ORDER — ONDANSETRON 4 MG PO TBDP
4.0000 mg | ORAL_TABLET | Freq: Once | ORAL | Status: AC
  Administered 2014-05-27: 4 mg via ORAL
  Filled 2014-05-27: qty 1

## 2014-05-27 NOTE — ED Notes (Signed)
During rounding nurse asked me to wait and not do vitals, she wanted to wait til she is woke up for breakfast.

## 2014-05-27 NOTE — BH Assessment (Signed)
Spoke with Hillis Range at [place] who called to inquire about the status of Telepsych evaluation on patient.  After consulting with [facility] Debarah Crape and Conrad Withrow-Psychiatric extender the patient's Telepsych is scheduled for 1700 on 05/27/14 per [name]. Di Kindle has been notified of this.   Shaune Pollack, MS, [place]  Assessment Counselor

## 2014-05-27 NOTE — ED Notes (Signed)
Was told in bridge call that pt was to received another TTS. This has not yet been done. Called [facility] and they are to speak with the extender.

## 2014-05-27 NOTE — ED Notes (Signed)
Sarah from [facility] here to see pt.
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
## 2014-05-28 ENCOUNTER — Encounter (HOSPITAL_COMMUNITY): Payer: Self-pay | Admitting: Emergency Medicine

## 2014-05-28 ENCOUNTER — Emergency Department (HOSPITAL_COMMUNITY)
Admission: EM | Admit: 2014-05-28 | Discharge: 2014-05-28 | Disposition: A | Discharge status: Home / Self Care | Payer: Self-pay | Attending: Emergency Medicine | Admitting: Emergency Medicine

## 2014-05-28 ENCOUNTER — Observation Stay (HOSPITAL_COMMUNITY)
Admission: AD | Admit: 2014-05-28 | Discharge: 2014-05-28 | Disposition: A | Discharge status: Home / Self Care | Payer: No Typology Code available for payment source | Source: Intra-hospital | Attending: Psychiatry | Admitting: Psychiatry

## 2014-05-28 ENCOUNTER — Encounter (HOSPITAL_COMMUNITY): Payer: Self-pay | Admitting: *Deleted

## 2014-05-28 DIAGNOSIS — K279 Peptic ulcer, site unspecified, unspecified as acute or chronic, without hemorrhage or perforation: Secondary | ICD-10-CM | POA: Insufficient documentation

## 2014-05-28 DIAGNOSIS — Z5900 Homelessness unspecified: Secondary | ICD-10-CM | POA: Insufficient documentation

## 2014-05-28 DIAGNOSIS — Z79899 Other long term (current) drug therapy: Secondary | ICD-10-CM | POA: Insufficient documentation

## 2014-05-28 DIAGNOSIS — F1020 Alcohol dependence, uncomplicated: Principal | ICD-10-CM | POA: Insufficient documentation

## 2014-05-28 DIAGNOSIS — F4310 Post-traumatic stress disorder, unspecified: Secondary | ICD-10-CM | POA: Insufficient documentation

## 2014-05-28 DIAGNOSIS — E039 Hypothyroidism, unspecified: Secondary | ICD-10-CM | POA: Insufficient documentation

## 2014-05-28 DIAGNOSIS — R569 Unspecified convulsions: Secondary | ICD-10-CM | POA: Insufficient documentation

## 2014-05-28 DIAGNOSIS — F319 Bipolar disorder, unspecified: Secondary | ICD-10-CM | POA: Insufficient documentation

## 2014-05-28 DIAGNOSIS — Z008 Encounter for other general examination: Secondary | ICD-10-CM | POA: Insufficient documentation

## 2014-05-28 DIAGNOSIS — K219 Gastro-esophageal reflux disease without esophagitis: Secondary | ICD-10-CM | POA: Insufficient documentation

## 2014-05-28 DIAGNOSIS — F1410 Cocaine abuse, uncomplicated: Secondary | ICD-10-CM | POA: Insufficient documentation

## 2014-05-28 DIAGNOSIS — Z8639 Personal history of other endocrine, nutritional and metabolic disease: Secondary | ICD-10-CM | POA: Insufficient documentation

## 2014-05-28 DIAGNOSIS — F1994 Other psychoactive substance use, unspecified with psychoactive substance-induced mood disorder: Secondary | ICD-10-CM | POA: Insufficient documentation

## 2014-05-28 DIAGNOSIS — Z862 Personal history of diseases of the blood and blood-forming organs and certain disorders involving the immune mechanism: Secondary | ICD-10-CM | POA: Insufficient documentation

## 2014-05-28 DIAGNOSIS — G40909 Epilepsy, unspecified, not intractable, without status epilepticus: Secondary | ICD-10-CM | POA: Insufficient documentation

## 2014-05-28 DIAGNOSIS — Z765 Malingerer [conscious simulation]: Secondary | ICD-10-CM | POA: Insufficient documentation

## 2014-05-28 DIAGNOSIS — F17200 Nicotine dependence, unspecified, uncomplicated: Secondary | ICD-10-CM | POA: Insufficient documentation

## 2014-05-28 MED ORDER — FLUOXETINE HCL 20 MG PO CAPS: 60.0000 mg | ORAL_CAPSULE | Freq: Every day | ORAL | Status: DC

## 2014-05-28 MED ORDER — LOPERAMIDE HCL 2 MG PO CAPS: 2.0000 mg | ORAL_CAPSULE | ORAL | Status: DC | PRN

## 2014-05-28 MED ORDER — NICOTINE 21 MG/24HR TD PT24
21.0000 mg | MEDICATED_PATCH | Freq: Every day | TRANSDERMAL | Status: DC
  Administered 2014-05-28: 21 mg via TRANSDERMAL
  Filled 2014-05-28 (×3): qty 1

## 2014-05-28 MED ORDER — LORAZEPAM 1 MG PO TABS: 1.0000 mg | ORAL_TABLET | Freq: Two times a day (BID) | ORAL | Status: DC

## 2014-05-28 MED ORDER — CARBAMAZEPINE ER 200 MG PO TB12
300.0000 mg | ORAL_TABLET | Freq: Two times a day (BID) | ORAL | Status: DC
  Filled 2014-05-28 (×3): qty 1

## 2014-05-28 MED ORDER — LORAZEPAM 1 MG PO TABS
1.0000 mg | ORAL_TABLET | Freq: Four times a day (QID) | ORAL | Status: DC
  Filled 2014-05-28: qty 1

## 2014-05-28 MED ORDER — ACETAMINOPHEN 325 MG PO TABS
650.0000 mg | ORAL_TABLET | Freq: Four times a day (QID) | ORAL | Status: DC | PRN
  Administered 2014-05-28: 650 mg via ORAL
  Filled 2014-05-28: qty 2

## 2014-05-28 MED ORDER — PANTOPRAZOLE SODIUM 40 MG PO TBEC
80.0000 mg | DELAYED_RELEASE_TABLET | Freq: Every day | ORAL | Status: DC
  Administered 2014-05-28: 80 mg via ORAL
  Filled 2014-05-28 (×4): qty 2

## 2014-05-28 MED ORDER — FLUOXETINE HCL 20 MG PO CAPS
60.0000 mg | ORAL_CAPSULE | Freq: Every day | ORAL | Status: DC
  Filled 2014-05-28 (×3): qty 3

## 2014-05-28 MED ORDER — ZIPRASIDONE MESYLATE 20 MG IM SOLR
20.0000 mg | Freq: Four times a day (QID) | INTRAMUSCULAR | Status: DC | PRN
  Filled 2014-05-28: qty 20

## 2014-05-28 MED ORDER — HYDROXYZINE HCL 25 MG PO TABS: 25.0000 mg | ORAL_TABLET | Freq: Four times a day (QID) | ORAL | Status: DC | PRN

## 2014-05-28 MED ORDER — TRAZODONE HCL 50 MG PO TABS: 50.0000 mg | ORAL_TABLET | Freq: Every evening | ORAL | Status: DC | PRN

## 2014-05-28 MED ORDER — LORAZEPAM 1 MG PO TABS
ORAL_TABLET | ORAL | Status: AC
  Administered 2014-05-28: 1 mg via ORAL
  Filled 2014-05-28: qty 1

## 2014-05-28 MED ORDER — NICOTINE 21 MG/24HR TD PT24
MEDICATED_PATCH | TRANSDERMAL | Status: AC
  Administered 2014-05-28: 21 mg via TRANSDERMAL
  Filled 2014-05-28: qty 1

## 2014-05-28 MED ORDER — ALUM & MAG HYDROXIDE-SIMETH 200-200-20 MG/5ML PO SUSP: 30.0000 mL | ORAL | Status: DC | PRN

## 2014-05-28 MED ORDER — LEVOTHYROXINE SODIUM 125 MCG PO TABS
125.0000 ug | ORAL_TABLET | Freq: Every day | ORAL | Status: DC
  Filled 2014-05-28 (×2): qty 1

## 2014-05-28 MED ORDER — MAGNESIUM HYDROXIDE 400 MG/5ML PO SUSP: 30.0000 mL | Freq: Every day | ORAL | Status: DC | PRN

## 2014-05-28 MED ORDER — STERILE WATER FOR INJECTION IJ SOLN
INTRAMUSCULAR | Status: AC
  Filled 2014-05-28: qty 10

## 2014-05-28 MED ORDER — DIPHENHYDRAMINE HCL 25 MG PO CAPS
ORAL_CAPSULE | ORAL | Status: AC
  Administered 2014-05-28: 50 mg
  Filled 2014-05-28: qty 2

## 2014-05-28 MED ORDER — THIAMINE HCL 100 MG/ML IJ SOLN: 100.0000 mg | Freq: Once | INTRAMUSCULAR | Status: DC

## 2014-05-28 MED ORDER — OMEPRAZOLE 40 MG PO CPDR: 40.0000 mg | DELAYED_RELEASE_CAPSULE | Freq: Every day | ORAL | Status: DC

## 2014-05-28 MED ORDER — ADULT MULTIVITAMIN W/MINERALS CH
1.0000 | ORAL_TABLET | Freq: Every day | ORAL | Status: DC
  Filled 2014-05-28 (×4): qty 1

## 2014-05-28 MED ORDER — CARBAMAZEPINE ER 200 MG PO TB12
300.0000 mg | ORAL_TABLET | ORAL | Status: DC
  Administered 2014-05-28: 300 mg via ORAL
  Filled 2014-05-28 (×3): qty 1

## 2014-05-28 MED ORDER — ONDANSETRON 4 MG PO TBDP: 4.0000 mg | ORAL_TABLET | Freq: Four times a day (QID) | ORAL | Status: DC | PRN

## 2014-05-28 MED ORDER — LORAZEPAM 1 MG PO TABS: 1.0000 mg | ORAL_TABLET | Freq: Three times a day (TID) | ORAL | Status: DC

## 2014-05-28 MED ORDER — LORAZEPAM 1 MG PO TABS
1.0000 mg | ORAL_TABLET | Freq: Four times a day (QID) | ORAL | Status: DC | PRN
  Administered 2014-05-28: 1 mg via ORAL

## 2014-05-28 MED ORDER — VITAMIN B-1 100 MG PO TABS
100.0000 mg | ORAL_TABLET | Freq: Every day | ORAL | Status: DC
  Filled 2014-05-28 (×2): qty 1

## 2014-05-28 MED ORDER — CARBAMAZEPINE ER 100 MG PO TB12: 300.0000 mg | ORAL_TABLET | Freq: Two times a day (BID) | ORAL | Status: DC

## 2014-05-28 MED ORDER — LORAZEPAM 1 MG PO TABS: 1.0000 mg | ORAL_TABLET | Freq: Every day | ORAL | Status: DC

## 2014-05-28 NOTE — ED Notes (Signed)
md talking with pt, pt refusing to talk. ammonia capsule used. Pt talking. md told pt she was being discharged and asked if she had a ride because she was trespassing. Pt asked multiple times to call for a ride or she would be transported to jail. Pt now talking in full sentences. Pt has abrasion to left foot from when she was rolling around on the ground at [facility]. bandaid will be applied

## 2014-05-28 NOTE — Progress Notes (Signed)
Patient ID: Jacqueline [name], female   DOB: 07/10/1966, 48 y.o.   MRN: [id number] Admission Note-Sent over from [name] ED to OBS to continue her detox and she is seeking rehab from here. She has been in OBS before and has been hospitalized multiple times for depression and alcohol dependence. She is unemployed and homeless and has limited support. She is unable to afford her medications and has been two weeks without them. She reports feeling of suicide with plan to overdose on her medications due to feelings of hopelessness and tired of living the way she is.She is abel to verbally contract for safety while here. Oriented to unit after skin search completed.

## 2014-05-28 NOTE — ED Provider Notes (Addendum)
CSN: [id number]     Arrival date & time 05/28/14  2009 History   First MD Initiated Contact with Patient 05/28/14 2012     Chief Complaint  Patient presents with  . Addiction Problem      HPI  Pt seen by myself only 1 hour ago. DC'd form [place]. Wouldn't leave. Became emotional and laid in the grass. Was transferred here.  Had no complaint other that she was homeless.  Refused to leave.  Taken to [place] by [employer].  Sat at jail and "refused to cooperate".  Thus, [employer] called EMS to bring her back for  Re-evaluation.     On arrival, pt being restrained.  However, when left alone, she quickly calms and engages in lucid conversation.   Pt freely admits to me again, that she is now homeless because her mother will not come get her or take her in. She states she would rather be in the emergency room "until she can get it together". By this she states that she would like to have financial resources, and a place to stay. Freely admits that she was "hoping someone here can help me with that".   Past Medical History  Diagnosis Date  . Thyroid disease   . Depression    No past surgical history on file. No family history on file. History  Substance Use Topics  . Smoking status: Not on file  . Smokeless tobacco: Not on file  . Alcohol Use: Not on file   OB History   Grav Para Term Preterm Abortions TAB SAB Ect Mult Living                 Review of Systems  Constitutional: Negative for fever, chills, diaphoresis, appetite change and fatigue.  HENT: Negative for mouth sores, sore throat and trouble swallowing.   Eyes: Negative for visual disturbance.  Respiratory: Negative for cough, chest tightness, shortness of breath and wheezing.   Cardiovascular: Negative for chest pain.  Gastrointestinal: Negative for nausea, vomiting, abdominal pain, diarrhea and abdominal distention.  Endocrine: Negative for polydipsia, polyphagia and polyuria.  Genitourinary: Negative for dysuria, frequency and hematuria.   Musculoskeletal: Negative for gait problem.  Skin: Negative for color change, pallor and rash.  Neurological: Negative for dizziness, syncope, light-headedness and headaches.  Hematological: Does not bruise/bleed easily.  Psychiatric/Behavioral: Negative for behavioral problems and confusion.   Patient earlier would not respond my questioning. However, now,she freely denies any physical symptoms or psychiatric complaints   Allergies  Review of patient's allergies indicates not on file.  Home Medications   Prior to Admission medications   Not on File   There were no vitals taken for this visit. Physical Exam  Constitutional: She is oriented to person, place, and time. She appears well-developed and well-nourished. No distress.  HENT:  Head: Normocephalic.  Eyes: Conjunctivae are normal. Pupils are equal, round, and reactive to light. No scleral icterus.  Neck: Normal range of motion. Neck supple. No thyromegaly present.  Cardiovascular: Normal rate and regular rhythm.  Exam reveals no gallop and no friction rub.   No murmur heard. Pulmonary/Chest: Effort normal and breath sounds normal. No respiratory distress. She has no wheezes. She has no rales.  Abdominal: Soft. Bowel sounds are normal. She exhibits no distension. There is no tenderness. There is no rebound.  Musculoskeletal: Normal range of motion.  Neurological: She is alert and oriented to person, place, and time.  Skin: Skin is warm and dry. No rash noted.  Psychiatric:  She has a normal mood and affect. Her behavior is normal.   Pt being restrained by [employer] and ER staff.  Placed in 4 Point restraints.  Immediately calm.  Awake, alert, oriented, and communicative. Pt states that she is not suicidal.  Pt not verbally threatening to me,  staff, or [employer].  States that she wants to be in the ER because she "has no place to live".    We had a long prolonged conversation about Politics, including the recent Presidential debate,  Elenore Rota Trumps upcoming town meeting, her plans to vote democratic again.  She is lucid, accurate, calm, articulate, and in no way delusional or psychotic.  She denies being suicidal.  She immediately engages in profane tirades upon being told that she cannot stay in the ER because she is homeless.    ED Course  Procedures (including critical care time) Labs Review Labs Reviewed - No data to display  Imaging Review No results found.   EKG Interpretation None      MDM   Final diagnoses:  Homeless  Bipolar affective disorder, most recent episode unspecified type, remission status unspecified  Malingering   Pt DC'd from ED medical care.  Is again going to [place] with [employer].    Tanna Furry, MD 05/28/14 2054  Tanna Furry, MD 05/28/14 [phone]

## 2014-05-28 NOTE — ED Notes (Addendum)
[facility] called and reported that pt had a bed at [facility] observation unit. [facility] reported that pt IVC status needed to be rescinded and pt could come to [facility] via [facility]. EDP aware and rescinded IVC paperwork. Accepting Physician Dwyane Dee.

## 2014-05-28 NOTE — ED Notes (Signed)
Pt was released and medically cleared from the ED at 1800 this same day, she was refusing to leave the ER and was escorted to jail, while at jail she was uncooperative and acting unresponsive, the jail requested that she come back to the ED for further evaluation. Pt uncooperative in the ED and trying to fight with staff.

## 2014-05-28 NOTE — ED Provider Notes (Addendum)
CSN: [id number]     Arrival date & time 05/28/14  1759 History   First MD Initiated Contact with Patient 05/28/14 1818     No chief complaint on file.     HPI  Pt presents via EMS from [address], outside of [facility].  Was in their obs unit until an hour ago. Per their notes, and my discussion with staff, the patient was Jacqueline [name].  She was evaluated by Psychiatrist, and deemed to not be at suicidal risk.  Pt, upon being told she was being discharged, became emotional.  She called her mother, who refused to come get her, or take her in.  Pt was crying, crawling on the floor, and refused to leave.  Taken to the edge of the facility property.  Laid in the grass and continued crying.    Upon arrival here I met her in our hallway. She is in a hallway bed.  She wants "some juice or something to eat".  She cries and states "I dont have anywhere to go".  Past Medical History  Diagnosis Date  . Peptic ulcer   . Alcohol abuse   . Anemia   . Benzodiazepine abuse   . Thyroid disease   . Alcoholism   . Narcotic abuse   . Back pain   . Thrombocytopenia 06/17/2011  . Hypothyroidism   . Seizures   . Medical history non-contributory     hypoglycemic   Past Surgical History  Procedure Laterality Date  . Cholecystectomy    . Abdominal surgery    . Esophagogastroduodenoscopy    . Gastric bypass    . Tubal ligation    . Esophagogastroduodenoscopy N/A 03/23/2013    Procedure: ESOPHAGOGASTRODUODENOSCOPY (EGD);  Surgeon: Ladene Artist, MD;  Location: [facility];  Service: Endoscopy;  Laterality: N/A;  . No past surgeries      gastric bypass   Family History  Problem Relation Age of Onset  . Alcohol abuse Father   . Alcoholism Father   . Cancer Other    History  Substance Use Topics  . Smoking status: Current Every Day Smoker -- 1.00 packs/day for 12 years    Types: Cigarettes    Last Attempt to Quit: 08/08/2011  . Smokeless tobacco: Never Used  . Alcohol Use: Yes     Comment: drink  4 liters a day. 1 case/day drinks Listerine   OB History   Grav Para Term Preterm Abortions TAB SAB Ect Mult Living   '3 3  3      3     ' Review of Systems  Unable to perform ROS: Other   Pt will not answer my questions, although she is speaking freely and of her own volition.   Allergies  Nsaids  Home Medications   Prior to Admission medications   Medication Sig Start Date End Date Taking? Authorizing Provider  carbamazepine (TEGRETOL XR) 100 MG 12 hr tablet Take 3 tablets (300 mg total) by mouth 2 (two) times daily. 05/28/14   Elmarie Shiley, NP  FLUoxetine (PROZAC) 20 MG capsule Take 3 capsules (60 mg total) by mouth daily. 05/28/14   Elmarie Shiley, NP  levothyroxine (SYNTHROID, LEVOTHROID) 125 MCG tablet Take 1 tablet (125 mcg total) by mouth daily before breakfast. For thyroid hormone replacement 12/23/13   Encarnacion Slates, NP  omeprazole (PRILOSEC) 40 MG capsule Take 1 capsule (40 mg total) by mouth daily. 05/28/14   Elmarie Shiley, NP   BP 100/58  Pulse 80  Temp(Src)  98.5 F (36.9 C) (Oral)  Resp 16  SpO2 94%  LMP 05/04/2014 Physical Exam  Constitutional: She appears well-developed and well-nourished. No distress.  HENT:  Head: Normocephalic.  Eyes: Conjunctivae are normal. Pupils are equal, round, and reactive to light. No scleral icterus.  Neck: Normal range of motion. Neck supple. No thyromegaly present.  Cardiovascular: Normal rate and regular rhythm.  Exam reveals no gallop and no friction rub.   No murmur heard. Pulmonary/Chest: Effort normal and breath sounds normal. No respiratory distress. She has no wheezes. She has no rales.  Abdominal: Soft. Bowel sounds are normal. She exhibits no distension. There is no tenderness. There is no rebound.  Musculoskeletal: Normal range of motion.  Neurological: She is alert.  Persistent has eyes closed unless she is stimulated. Her level of consciousness is not a concern. Moves extremities without difficulty. Speaks in response to her  environment.   Skin: Skin is warm and dry. No rash noted.  Psychiatric: She has a normal mood and affect. Her behavior is normal.    ED Course  Procedures (including critical care time) Labs Review Labs Reviewed - No data to display  Imaging Review No results found.   EKG Interpretation None      MDM   Final diagnoses:  Homeless    No acute medical condition.  Pt refuses to leave.  [employer] present.  Pt given option of using bus pass (which she has in her hand)  to shelter, or jail. Pt escorted out of ED by [employer].    Tanna Furry, MD 05/28/14 Jacqueline [name]  Tanna Furry, MD 05/28/14 [phone]

## 2014-05-28 NOTE — ED Notes (Signed)
Bed: WA20 Expected date:  Expected time:  Means of arrival:  Comments: Hold

## 2014-05-28 NOTE — Progress Notes (Signed)
[facility] INPATIENT:  Family/Significant Other Suicide Prevention Education  Suicide Prevention Education:  Patient Refusal for Family/Significant Other Suicide Prevention Education: The patient Jacqueline [name] has refused to provide written consent for family/significant other to be provided Family/Significant Other Suicide Prevention Education during admission and/or prior to discharge.  Physician notified.  Davina Poke 05/28/2014, 11:39 AM

## 2014-05-28 NOTE — Progress Notes (Signed)
Patient ID: Jacqueline [name], female   DOB: [birth year], 48 y.o.   MRN: [id number] On phone with her mother, speaking very loudly and using frequent curse words,not responding to staff redirection, continuing to be loud and disrespectful, telling her mom she will be homeless tomorrow when she is discharged from the OBS unit and begging her to take her in. She handed me the phone and she told me she was 40 and unable to handle her daughter any more and she could not come stay with her.Crying, and louder now when asked her to hang up the phone. She got off the phone and began to crawl on the floor, broke the pencil she was holding by pounding it on the floor. Loud, would quiet for a second and back to being loud and cursing. Loud and crying and crawled to the desk, took the computor mouse and was squeezing and clicking it as hard as she could.Next assisted her back to her bed and assisted her to call [facility] to assist her in finding a facility to go to from here.. Grabbed my handsanitizer and snapped the cord from the clip but got it back from her without drinking any or any harm being caused. Called [facility] for assist. He directed me to give Ativan, and does have a dose scheduled for 1700, gave her that dose. Less than five minutes later yelling at me that I didn't give her her Ativan, all she wants to do is go to sleep, that she cant be homeless. Again, escorted her back to her bed, telling her to quiet and stop upsetting the others in the room. Now, yelling for me to call her mommy or let her call her mommy. Called a man power code due to her consistently irrational behavior and non compliance. She has been agitated and loud, not following direction for over one hour. AC directed by Dr to discharge her due to her lack of cooperation. She had been in the [facility] ED since Wed and had been detoxing since then. Her only complaint on admission related to detox was a headache and anxiety. CIWA on admission was a 10, and  given a 1mg  Ativan at 1255. She was initially cooperative with the admission process, changed once on the unit and told by the Dr. She would be discharged tomorrow and she became very anxious about being homeless. She per her own report has burnt a lot of bridges and no agency will take her back, or has a bed at this time. Refused to leave when discharged. Escorted out of the building by [facility], security and staff with all of her property returned along with three bus passes to assist her to get someplace. She walked out on her own without any resistance. No prescriptions given to her. She did have in her property on admission some Synthroid, Tegretol, and Prilosec, her other medication bottles were empty.

## 2014-05-28 NOTE — ED Notes (Signed)
Ammonia inhalent used. Pt alert and able to sit upright and speak. Pt refuses to talk to MD.

## 2014-05-28 NOTE — ED Notes (Signed)
Bed: [facility] Expected date:  Expected time:  Means of arrival:  Comments: Alyse Low

## 2014-05-28 NOTE — Progress Notes (Addendum)
[facility] On arrival, patient agitated and combative. Placed in restraints per Md, Bilateral wrist and lower as well.  Md assessed patient, Pt wants to leave. Md granted patient option to leave and she is now leaving in [employer] custody at this time. Restraints removed, no medication given. Pt alert was alert and oriented prior to discharge

## 2014-05-28 NOTE — ED Notes (Signed)
Per ems pt discharged from [facility] this morning, hung around outside all day. [place] called [employer], that is when pt went "unresponsive" and rolled around on the ground, would move back and forth from sunny to shady spots. During her "unconscious" moments she asked EMS to not "sternal rub her". Upon arrival to ED rn sternal rubbed pt, which she then opened her eyes and said "stop". Pt complied with rn getting oral temperature and followed commands.

## 2014-05-28 NOTE — ED Notes (Signed)
[employer] here to transport pt. Pt walking out of building with [employer].

## 2014-05-28 NOTE — Progress Notes (Signed)
Patient ID: Jacqueline [name], female   DOB: 1966-07-17, 48 y.o.   MRN: [id number] Client complaining of a headache of a 6 and sx of withdrawal. She is CIWA =10. There are currently no orders. I have called Dr. Parke Poisson and Dr. Dwyane Dee and waiting for orders.

## 2014-05-28 NOTE — Progress Notes (Signed)
Per, [name] patient accepted to [facility] informed the nurse that the patient IVC has to be  rescinded by the EDP before the patient can come to the OBS Unit.
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
## 2014-05-28 NOTE — Progress Notes (Signed)
[facility] MD Progress Note  05/28/2014 4:14 PM Jacqueline [name]  MRN:  [id number] Subjective:  Jacqueline [name] returns to our service. States she relapsed on drinking alcohol couple of weeks ago. She states she has been off her medications. She admits she has not follow up with anyone. She admits she does not have a place to go. She has been staying "here and there" She wants to go to a residential treatment center.  Diagnosis:   DSM5: Substance/Addictive Disorders:  Alcohol Related Disorder - Severe (303.90), Cocaine related disorder Depressive Disorders:  Major Depressive Disorder - Moderate (296.22) Total Time spent with patient: 30 minutes  Axis I: Substance Induced Mood Disorder  ADL's:  Intact  Sleep: Fair  Appetite:  Fair  Psychiatric Specialty Exam: Physical Exam  Review of Systems  Constitutional: Negative.   HENT: Negative.   Eyes: Negative.   Respiratory: Negative.   Cardiovascular: Negative.   Gastrointestinal: Negative.   Genitourinary: Negative.   Musculoskeletal: Negative.   Skin: Negative.   Neurological: Negative.   Endo/Heme/Allergies: Negative.   Psychiatric/Behavioral: Positive for depression and substance abuse. The patient is nervous/anxious.     Blood pressure 129/74, pulse 68, temperature 98.6 F (37 C), temperature source Oral, resp. rate 18, height 5\' 9"  (1.753 m), weight 81.194 kg (179 lb), last menstrual period 05/04/2014.Body mass index is 26.42 kg/(m^2).  General Appearance: Fairly Groomed  Engineer, water::  Fair  Speech:  Clear and Coherent  Volume:  fluctuates  Mood:  Anxious and worried about not having a place to go  Affect:  Restricted  Thought Process:  Coherent and Goal Directed  Orientation:  Full (Time, Place, and Person)  Thought Content:  events, symptoms, worries, concerns  Suicidal Thoughts:  No   Homicidal Thoughts:  No  Memory:  Immediate;   Fair Recent;   Fair Remote;   Fair  Judgement:  Fair  Insight:  Present  Psychomotor Activity:   Restlessness  Concentration:  Fair  Recall:  [employer] of Knowledge:NA  Language: Fair  Akathisia:  No  Handed:    AIMS (if indicated):     Assets:  Others:  very resourceful  Sleep:      Musculoskeletal: Strength & Muscle Tone: within normal limits Gait & Station: normal Patient leans: N/A  Current Medications: Current Facility-Administered Medications  Medication Dose Route Frequency Provider Last Rate Last Dose  . acetaminophen (TYLENOL) tablet 650 mg  650 mg Oral Q6H PRN Nicholaus Bloom, MD   650 mg at 05/28/14 1233  . alum & mag hydroxide-simeth (MAALOX/MYLANTA) 200-200-20 MG/5ML suspension 30 mL  30 mL Oral Q4H PRN Nicholaus Bloom, MD      . Derrill Memo ON 05/29/2014] carbamazepine (TEGRETOL XR) 12 hr tablet 300 mg  300 mg Oral BID Nicholaus Bloom, MD      . carbamazepine (TEGRETOL XR) 12 hr tablet 300 mg  300 mg Oral 2 times per day on Fri Nicholaus Bloom, MD   300 mg at 05/28/14 1345  . FLUoxetine (PROZAC) capsule 60 mg  60 mg Oral Daily Neita Garnet, MD      . hydrOXYzine (ATARAX/VISTARIL) tablet 25 mg  25 mg Oral Q6H PRN Nicholaus Bloom, MD      . Derrill Memo ON 05/29/2014] levothyroxine (SYNTHROID, LEVOTHROID) tablet 125 mcg  125 mcg Oral QAC breakfast Nicholaus Bloom, MD      . loperamide (IMODIUM) capsule 2-4 mg  2-4 mg Oral PRN Nicholaus Bloom, MD      .  LORazepam (ATIVAN) tablet 1 mg  1 mg Oral Q6H PRN Nicholaus Bloom, MD   1 mg at 05/28/14 1234  . LORazepam (ATIVAN) tablet 1 mg  1 mg Oral QID Nicholaus Bloom, MD       Followed by  . [START ON 05/29/2014] LORazepam (ATIVAN) tablet 1 mg  1 mg Oral TID Nicholaus Bloom, MD       Followed by  . [START ON 05/30/2014] LORazepam (ATIVAN) tablet 1 mg  1 mg Oral BID Nicholaus Bloom, MD       Followed by  . [START ON 06/01/2014] LORazepam (ATIVAN) tablet 1 mg  1 mg Oral Daily Nicholaus Bloom, MD      . magnesium hydroxide (MILK OF MAGNESIA) suspension 30 mL  30 mL Oral Daily PRN Nicholaus Bloom, MD      . multivitamin with minerals tablet 1 tablet  1 tablet Oral  Daily Nicholaus Bloom, MD      . nicotine (NICODERM CQ - dosed in mg/24 hours) patch 21 mg  21 mg Transdermal Daily Nicholaus Bloom, MD   21 mg at 05/28/14 1334  . ondansetron (ZOFRAN-ODT) disintegrating tablet 4 mg  4 mg Oral Q6H PRN Nicholaus Bloom, MD      . pantoprazole (PROTONIX) EC tablet 80 mg  80 mg Oral Daily Neita Garnet, MD   80 mg at 05/28/14 1600  . thiamine (B-1) injection 100 mg  100 mg Intramuscular Once Nicholaus Bloom, MD      . Derrill Memo ON 05/29/2014] thiamine (VITAMIN B-1) tablet 100 mg  100 mg Oral Daily Nicholaus Bloom, MD      . traZODone (DESYREL) tablet 50 mg  50 mg Oral QHS PRN Nicholaus Bloom, MD        Lab Results: No results found for this or any previous visit (from the past 48 hour(s)).  Physical Findings: AIMS: Facial and Oral Movements Muscles of Facial Expression: None, normal Lips and Perioral Area: None, normal Jaw: None, normal Tongue: None, normal,Extremity Movements Upper (arms, wrists, hands, fingers): None, normal Lower (legs, knees, ankles, toes): None, normal, Trunk Movements Neck, shoulders, hips: None, normal, Overall Severity Severity of abnormal movements (highest score from questions above): None, normal Incapacitation due to abnormal movements: None, normal Patient's awareness of abnormal movements (rate only patient's report): No Awareness, Dental Status Current problems with teeth and/or dentures?: No Does patient usually wear dentures?: No  CIWA:  CIWA-Ar Total: 10 COWS:     Treatment Plan Summary: Daily contact with patient to assess and evaluate symptoms and progress in treatment Medication management  Plan: Supportive approach/coping skills           Discussed with Jacqueline [name] the fact that there are no residential treatment facilities that would take her ( we have tried multiple times               in the past but she has been to basically all of the ones she can go to and some she has left in not so good terms)           She had stated she  was going to check with the [employer]           Encouraged to follow up with outpatient treatment  Medical Decision Making Problem Points:  Review of psycho-social stressors (1) Data Points:  Review of medication regiment & side effects (2)  I certify that inpatient services furnished can reasonably  be expected to improve the patient's condition.   Katye Valek A 05/28/2014, 4:14 PM

## 2014-05-28 NOTE — Discharge Instructions (Addendum)
For your ongoing behavioral health needs contact [facility]:       [address]      [place], [place] [address]      [phone]  The following facilities and programs may be able to help you with your substance abuse problems.  Contact them at your convenience:       Residential:       [facility]      [address] [place], [place] [address]      [facility]      [place], [place] [address]      [phone]       [facility]      [place], [place] [address]       [phone]       Outpatient       [facility]      [address] [place], [place] [address]      [phone]   The following shelters may be able to help you with your emergency housing needs:       [facility] [place]      [place], [place] [address]       [phone]       [facility]      [address]      [place], [place] [address]       [phone]  If you have a behavioral health crisis you have several options, all of which are available 24 hours a day, 7 days a week:  *Call Mobile Crisis and a clinician will come to you: [phone] *Call 911 *Go to the [facility] at [place]. [place], [place] *Go to your local hospital emergency department

## 2014-05-28 NOTE — ED Notes (Signed)
Pt alert and oriented x4. Respirations even and unlabored, bilateral symmetrical rise and fall of chest. Skin warm and dry. In no acute distress. Denies needs.   

## 2014-05-28 NOTE — Discharge Instructions (Signed)
Utilize the outpatient resources given to you in the Last 24 hours at the [facility].    Call [facility] for outpatient treatment initiation.  You may use the Bus Passes given to you by [facility] to get to your referral appointment.

## 2014-05-28 NOTE — Plan of Care (Signed)
[facility] Observation Crisis Plan  Reason for Crisis Plan:  Substance Abuse   Plan of Care:  Referral for Substance Abuse  Family Support:    Unspecified family members provide limited support.  Current Living Environment:  Living Arrangements: Alone; Homeless  Insurance:  Self pay Hospital Account   Name Acct ID Class Status Primary Coverage   [place], [place] [id number] [place] Open None        Guarantor Account (for Hospital Account [id number])   Name Relation to Pt Service Area Active? Acct Type   Phillis Haggis Self [facility] Yes Behavioral Health   Address Phone       [address]  [place], [place] [address] [phone])          Coverage Information (for Hospital Account [id number])   Not on file      Legal Guardian:   Self  Primary Care Provider:  No PCP Per Patient  Current Outpatient Providers:  Beverly Sessions  Psychiatrist:   Beverly Sessions  Counselor/Therapist:   [facility]  Compliant with Medications:  No; off medications for the past 2 weeks.  Additional Information: After consulting with Neita Garnet, MD and Carlton Adam, MD it has been determined that pt does not present a life threatening danger to herself or others, and that psychiatric hospitalization is not indicated for her at this time. Pt is to be discharged with contact information for [facility], as well as several area substance abuse programs and several area homeless shelters.   Jalene Mullet, [place] Triage Specialist Ysidro Evert [place] 8/21/20154:41 PM
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
## 2014-06-01 ENCOUNTER — Emergency Department (HOSPITAL_COMMUNITY)
Admission: EM | Admit: 2014-06-01 | Discharge: 2014-06-02 | Discharge status: Against Medical Advice | Payer: No Typology Code available for payment source | Attending: Emergency Medicine | Admitting: Emergency Medicine

## 2014-06-01 ENCOUNTER — Encounter (HOSPITAL_COMMUNITY): Payer: Self-pay | Admitting: Emergency Medicine

## 2014-06-01 DIAGNOSIS — F39 Unspecified mood [affective] disorder: Secondary | ICD-10-CM

## 2014-06-01 DIAGNOSIS — R45851 Suicidal ideations: Secondary | ICD-10-CM | POA: Insufficient documentation

## 2014-06-01 DIAGNOSIS — Z8669 Personal history of other diseases of the nervous system and sense organs: Secondary | ICD-10-CM | POA: Insufficient documentation

## 2014-06-01 DIAGNOSIS — Z9119 Patient's noncompliance with other medical treatment and regimen: Secondary | ICD-10-CM | POA: Insufficient documentation

## 2014-06-01 DIAGNOSIS — F10239 Alcohol dependence with withdrawal, unspecified: Secondary | ICD-10-CM

## 2014-06-01 DIAGNOSIS — F329 Major depressive disorder, single episode, unspecified: Secondary | ICD-10-CM

## 2014-06-01 DIAGNOSIS — E039 Hypothyroidism, unspecified: Secondary | ICD-10-CM | POA: Insufficient documentation

## 2014-06-01 DIAGNOSIS — F17200 Nicotine dependence, unspecified, uncomplicated: Secondary | ICD-10-CM | POA: Insufficient documentation

## 2014-06-01 DIAGNOSIS — R748 Abnormal levels of other serum enzymes: Secondary | ICD-10-CM

## 2014-06-01 DIAGNOSIS — Z8719 Personal history of other diseases of the digestive system: Secondary | ICD-10-CM | POA: Insufficient documentation

## 2014-06-01 DIAGNOSIS — F3289 Other specified depressive episodes: Secondary | ICD-10-CM

## 2014-06-01 DIAGNOSIS — F10920 Alcohol use, unspecified with intoxication, uncomplicated: Secondary | ICD-10-CM

## 2014-06-01 DIAGNOSIS — F10220 Alcohol dependence with intoxication, uncomplicated: Secondary | ICD-10-CM

## 2014-06-01 DIAGNOSIS — Z862 Personal history of diseases of the blood and blood-forming organs and certain disorders involving the immune mechanism: Secondary | ICD-10-CM | POA: Insufficient documentation

## 2014-06-01 DIAGNOSIS — F10939 Alcohol use, unspecified with withdrawal, unspecified: Secondary | ICD-10-CM | POA: Insufficient documentation

## 2014-06-01 DIAGNOSIS — Z79899 Other long term (current) drug therapy: Secondary | ICD-10-CM | POA: Insufficient documentation

## 2014-06-01 DIAGNOSIS — F1010 Alcohol abuse, uncomplicated: Secondary | ICD-10-CM

## 2014-06-01 DIAGNOSIS — F4310 Post-traumatic stress disorder, unspecified: Secondary | ICD-10-CM | POA: Insufficient documentation

## 2014-06-01 DIAGNOSIS — F1020 Alcohol dependence, uncomplicated: Secondary | ICD-10-CM | POA: Insufficient documentation

## 2014-06-01 DIAGNOSIS — Z91199 Patient's noncompliance with other medical treatment and regimen due to unspecified reason: Secondary | ICD-10-CM | POA: Insufficient documentation

## 2014-06-01 LAB — CBC
HCT: 32.2 % — ABNORMAL LOW (ref 36.0–46.0)
Hemoglobin: 10.1 g/dL — ABNORMAL LOW (ref 12.0–15.0)
MCH: 27.5 pg (ref 26.0–34.0)
MCHC: 31.4 g/dL (ref 30.0–36.0)
MCV: 87.7 fL (ref 78.0–100.0)
Platelets: 472 10*3/uL — ABNORMAL HIGH (ref 150–400)
RBC: 3.67 MIL/uL — AB (ref 3.87–5.11)
RDW: 20.7 % — AB (ref 11.5–15.5)
WBC: 8 10*3/uL (ref 4.0–10.5)

## 2014-06-01 LAB — RAPID URINE DRUG SCREEN, HOSP PERFORMED
Amphetamines: NOT DETECTED
BARBITURATES: NOT DETECTED
Benzodiazepines: NOT DETECTED
Cocaine: NOT DETECTED
Opiates: NOT DETECTED
TETRAHYDROCANNABINOL: NOT DETECTED

## 2014-06-01 LAB — ETHANOL: ALCOHOL ETHYL (B): 291 mg/dL — AB (ref 0–11)

## 2014-06-01 LAB — SALICYLATE LEVEL: Salicylate Lvl: <2 mg/dL — ABNORMAL LOW (ref 2.8–20.0)

## 2014-06-01 LAB — COMPREHENSIVE METABOLIC PANEL
ALT: 41 U/L — ABNORMAL HIGH (ref 0–35)
ANION GAP: 20 — AB (ref 5–15)
AST: 81 U/L — ABNORMAL HIGH (ref 0–37)
Albumin: 4.1 g/dL (ref 3.5–5.2)
Alkaline Phosphatase: 91 U/L (ref 39–117)
BILIRUBIN TOTAL: 0.3 mg/dL (ref 0.3–1.2)
BUN: 9 mg/dL (ref 6–23)
CHLORIDE: 98 meq/L (ref 96–112)
CO2: 19 meq/L (ref 19–32)
CREATININE: 0.77 mg/dL (ref 0.50–1.10)
Calcium: 8.4 mg/dL (ref 8.4–10.5)
GFR calc Af Amer: >90 mL/min (ref >90)
Glucose, Bld: 84 mg/dL (ref 70–99)
Potassium: 3.7 mEq/L (ref 3.7–5.3)
Sodium: 137 mEq/L (ref 137–147)
Total Protein: 7.4 g/dL (ref 6.0–8.3)

## 2014-06-01 LAB — ACETAMINOPHEN LEVEL: Acetaminophen (Tylenol), Serum: <15 ug/mL (ref 10–30)

## 2014-06-01 MED ORDER — DIPHENHYDRAMINE HCL 25 MG PO CAPS
25.0000 mg | ORAL_CAPSULE | Freq: Once | ORAL | Status: AC
  Administered 2014-06-01: 25 mg via ORAL
  Filled 2014-06-01: qty 1

## 2014-06-01 MED ORDER — LORAZEPAM 1 MG PO TABS: 0.0000 mg | ORAL_TABLET | Freq: Two times a day (BID) | ORAL | Status: DC

## 2014-06-01 MED ORDER — DIPHENHYDRAMINE HCL 25 MG PO CAPS
25.0000 mg | ORAL_CAPSULE | ORAL | Status: DC | PRN
  Administered 2014-06-02: 25 mg via ORAL
  Filled 2014-06-01: qty 1

## 2014-06-01 MED ORDER — THIAMINE HCL 100 MG/ML IJ SOLN: 100.0000 mg | Freq: Every day | INTRAMUSCULAR | Status: DC

## 2014-06-01 MED ORDER — LORAZEPAM 1 MG PO TABS
0.0000 mg | ORAL_TABLET | Freq: Four times a day (QID) | ORAL | Status: DC
  Administered 2014-06-01: 1 mg via ORAL
  Administered 2014-06-02: 2 mg via ORAL
  Filled 2014-06-01: qty 1
  Filled 2014-06-01: qty 2

## 2014-06-01 MED ORDER — VITAMIN B-1 100 MG PO TABS
100.0000 mg | ORAL_TABLET | Freq: Every day | ORAL | Status: DC
  Administered 2014-06-01 – 2014-06-02 (×2): 100 mg via ORAL
  Filled 2014-06-01 (×2): qty 1

## 2014-06-01 MED ORDER — PREDNISONE 20 MG PO TABS
40.0000 mg | ORAL_TABLET | Freq: Every day | ORAL | Status: DC
Start: 2014-06-02 — End: 2014-06-02
  Administered 2014-06-02: 40 mg via ORAL
  Filled 2014-06-01: qty 2

## 2014-06-01 MED ORDER — NICOTINE 21 MG/24HR TD PT24
21.0000 mg | MEDICATED_PATCH | Freq: Every day | TRANSDERMAL | Status: DC
  Administered 2014-06-01 – 2014-06-02 (×2): 21 mg via TRANSDERMAL
  Filled 2014-06-01 (×2): qty 1

## 2014-06-01 MED ORDER — PREDNISONE 20 MG PO TABS
60.0000 mg | ORAL_TABLET | Freq: Once | ORAL | Status: AC
  Administered 2014-06-01: 60 mg via ORAL
  Filled 2014-06-01: qty 3

## 2014-06-01 NOTE — ED Provider Notes (Signed)
Medical screening examination/treatment/procedure(s) were performed by non-physician practitioner and as supervising physician I was immediately available for consultation/collaboration.     Veryl Speak, MD 06/01/14 [phone]

## 2014-06-01 NOTE — ED Notes (Signed)
Was at [facility] and said she felt sad and  States she wants to die pt is  Brought in by [facility] pt is intoxicated .

## 2014-06-01 NOTE — ED Notes (Signed)
Pt stated that "i want to be taken off of Suicide watch, I just told them that so I can get help."  Rn explained that she would have a chance to talk with the counselor on the TTS and explain to her what her needs are.  Pt states she now has a place to live and wants to detox and go to live with her [place] counselor.
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
## 2014-06-01 NOTE — Consult Note (Signed)
Patient discussed, can be transferred to [facility] O[facility] for stabilization

## 2014-06-01 NOTE — ED Provider Notes (Signed)
Patient is complaining of an itchy rash and states that she had exposure to poison oak. On exam, if she has erythematous, raised area with some vesicle formation and in the near groupings consistent with poison ivy dermatitis. She is started on prednisone and will be given Benadryl as needed for itching.  Delora Fuel, MD 16/10/96 0454

## 2014-06-01 NOTE — ED Provider Notes (Signed)
CSN: [id number]     Arrival date & time 06/01/14  1413 History  This chart was scribed for non-physician practitioner, Montine Circle, PA-C working with Veryl Speak, MD by Frederich Balding, ED scribe. This patient was seen in room C20C/C20C and the patient's care was started at 3:46 PM.   Chief Complaint  Patient presents with  . Suicidal   The history is provided by the patient. No language interpreter was used.   HPI Comments: Jacqueline [name] is a 48 y.o. female who presents to the Emergency Department complaining of worsening suicidal ideations that started 2-3 days ago. States she thinks it is due to increased drinking. Pt has been drinking rubbing alcohol and hand sanitizer daily. She is unsure of how much she has per day. Her last drink was earlier today. Denies HI. Pt takes her daily medications as prescribed. Denies chest pain, abdominal pain or other physical complaints at this time.   Past Medical History  Diagnosis Date  . Peptic ulcer   . Alcohol abuse   . Anemia   . Benzodiazepine abuse   . Thyroid disease   . Alcoholism   . Narcotic abuse   . Back pain   . Thrombocytopenia 06/17/2011  . Hypothyroidism   . Seizures   . Medical history non-contributory     hypoglycemic   Past Surgical History  Procedure Laterality Date  . Cholecystectomy    . Abdominal surgery    . Esophagogastroduodenoscopy    . Gastric bypass    . Tubal ligation    . Esophagogastroduodenoscopy N/A 03/23/2013    Procedure: ESOPHAGOGASTRODUODENOSCOPY (EGD);  Surgeon: Ladene Artist, MD;  Location: [facility];  Service: Endoscopy;  Laterality: N/A;  . No past surgeries      gastric bypass   Family History  Problem Relation Age of Onset  . Alcohol abuse Father   . Alcoholism Father   . Cancer Other    History  Substance Use Topics  . Smoking status: Current Every Day Smoker -- 1.00 packs/day for 12 years    Types: Cigarettes    Last Attempt to Quit: 08/08/2011  . Smokeless tobacco: Never  Used  . Alcohol Use: Yes     Comment: drink 4 liters a day. 1 case/day drinks Listerine   OB History   Grav Para Term Preterm Abortions TAB SAB Ect Mult Living   3 3  3      3      Review of Systems  Constitutional: Negative for fever.  HENT: Negative for congestion.   Eyes: Negative for redness.  Respiratory: Negative for shortness of breath.   Cardiovascular: Negative for chest pain.  Gastrointestinal: Negative for abdominal pain.  Musculoskeletal: Negative for gait problem.  Skin: Negative for rash.  Neurological: Negative for speech difficulty.  Psychiatric/Behavioral: Positive for suicidal ideas. Negative for confusion.   Allergies  Nsaids  Home Medications   Prior to Admission medications   Medication Sig Start Date End Date Taking? Authorizing Provider  carbamazepine (TEGRETOL XR) 100 MG 12 hr tablet Take 3 tablets (300 mg total) by mouth 2 (two) times daily. 05/28/14   Elmarie Shiley, NP  FLUoxetine (PROZAC) 20 MG capsule Take 3 capsules (60 mg total) by mouth daily. 05/28/14   Elmarie Shiley, NP  levothyroxine (SYNTHROID, LEVOTHROID) 125 MCG tablet Take 1 tablet (125 mcg total) by mouth daily before breakfast. For thyroid hormone replacement 12/23/13   Encarnacion Slates, NP  omeprazole (PRILOSEC) 40 MG capsule Take 1 capsule (40 mg  total) by mouth daily. 05/28/14   Elmarie Shiley, NP   BP 127/80  Pulse 112  Temp(Src) 98.7 F (37.1 C) (Oral)  Resp 20  SpO2 96%  LMP 05/04/2014  Physical Exam  Nursing note and vitals reviewed. Constitutional: She is oriented to person, place, and time. She appears well-developed and well-nourished. No distress.  HENT:  Head: Normocephalic and atraumatic.  Eyes: Conjunctivae and EOM are normal.  Cardiovascular: Regular rhythm and normal heart sounds.  Tachycardia present.   Pulmonary/Chest: Effort normal and breath sounds normal. No stridor. No respiratory distress.  Abdominal: Soft. She exhibits no distension. There is no tenderness.   Musculoskeletal: She exhibits no edema.  Neurological: She is alert and oriented to person, place, and time. No cranial nerve deficit.  Skin: Skin is warm and dry.  Psychiatric: She has a normal mood and affect. She expresses suicidal ideation. She expresses no homicidal ideation.  Intoxicated.    ED Course  Procedures (including critical care time)  DIAGNOSTIC STUDIES: Oxygen Saturation is 96% on RA, normal by my interpretation.    COORDINATION OF CARE: 3:49 PM-Discussed treatment plan which includes lab work and speaking with behavioral health with pt at bedside and pt agreed to plan.   Results for orders placed during the hospital encounter of 06/01/14  CBC      Result Value Ref Range   WBC 8.0  4.0 - 10.5 K/uL   RBC 3.67 (*) 3.87 - 5.11 MIL/uL   Hemoglobin 10.1 (*) 12.0 - 15.0 g/dL   HCT 32.2 (*) 36.0 - 46.0 %   MCV 87.7  78.0 - 100.0 fL   MCH 27.5  26.0 - 34.0 pg   MCHC 31.4  30.0 - 36.0 g/dL   RDW 20.7 (*) 11.5 - 15.5 %   Platelets 472 (*) 150 - 400 K/uL  COMPREHENSIVE METABOLIC PANEL      Result Value Ref Range   Sodium 137  137 - 147 mEq/L   Potassium 3.7  3.7 - 5.3 mEq/L   Chloride 98  96 - 112 mEq/L   CO2 19  19 - 32 mEq/L   Glucose, Bld 84  70 - 99 mg/dL   BUN 9  6 - 23 mg/dL   Creatinine, Ser 0.77  0.50 - 1.10 mg/dL   Calcium 8.4  8.4 - 10.5 mg/dL   Total Protein 7.4  6.0 - 8.3 g/dL   Albumin 4.1  3.5 - 5.2 g/dL   AST 81 (*) 0 - 37 U/L   ALT 41 (*) 0 - 35 U/L   Alkaline Phosphatase 91  39 - 117 U/L   Total Bilirubin 0.3  0.3 - 1.2 mg/dL   GFR calc non Af Amer >90  >90 mL/min   GFR calc Af Amer >90  >90 mL/min   Anion gap 20 (*) 5 - 15  ETHANOL      Result Value Ref Range   Alcohol, Ethyl (B) 291 (*) 0 - 11 mg/dL  ACETAMINOPHEN LEVEL      Result Value Ref Range   Acetaminophen (Tylenol), Serum <15.0  10 - 30 ug/mL  SALICYLATE LEVEL      Result Value Ref Range   Salicylate Lvl <6.7 (*) 2.8 - 20.0 mg/dL   No results found.   Imaging Review No  results found.   EKG Interpretation None      MDM   Final diagnoses:  None    Patient with SI and alcohol abuse.  TTS consult is pending.  CIWA ordered.  Labs appear to be near baseline with the exception of Etoh level being 291.  I personally performed the services described in this documentation, which was scribed in my presence. The recorded information has been reviewed and is accurate.  Montine Circle, PA-C 06/01/14 1559

## 2014-06-02 ENCOUNTER — Encounter (HOSPITAL_COMMUNITY): Payer: Self-pay | Admitting: Emergency Medicine

## 2014-06-02 MED ORDER — ACETAMINOPHEN 325 MG PO TABS
650.0000 mg | ORAL_TABLET | Freq: Four times a day (QID) | ORAL | Status: DC | PRN
  Administered 2014-06-02: 650 mg via ORAL
  Filled 2014-06-02: qty 2

## 2014-06-02 MED ORDER — DIPHENHYDRAMINE HCL 25 MG PO CAPS: 50.0000 mg | ORAL_CAPSULE | Freq: Four times a day (QID) | ORAL | Status: DC | PRN

## 2014-06-02 MED ORDER — DIPHENHYDRAMINE HCL 25 MG PO CAPS
25.0000 mg | ORAL_CAPSULE | Freq: Once | ORAL | Status: AC
  Administered 2014-06-02: 25 mg via ORAL
  Filled 2014-06-02: qty 1

## 2014-06-02 MED ORDER — CARBAMAZEPINE ER 200 MG PO TB12
300.0000 mg | ORAL_TABLET | Freq: Two times a day (BID) | ORAL | Status: DC
  Administered 2014-06-02: 300 mg via ORAL
  Filled 2014-06-02 (×2): qty 1

## 2014-06-02 MED ORDER — PANTOPRAZOLE SODIUM 40 MG PO TBEC
40.0000 mg | DELAYED_RELEASE_TABLET | Freq: Every day | ORAL | Status: DC
  Administered 2014-06-02: 40 mg via ORAL
  Filled 2014-06-02: qty 1

## 2014-06-02 MED ORDER — FLUOXETINE HCL 20 MG PO CAPS
60.0000 mg | ORAL_CAPSULE | Freq: Every day | ORAL | Status: DC
  Administered 2014-06-02: 60 mg via ORAL
  Filled 2014-06-02: qty 3

## 2014-06-02 MED ORDER — LEVOTHYROXINE SODIUM 125 MCG PO TABS
125.0000 ug | ORAL_TABLET | Freq: Every day | ORAL | Status: DC
  Administered 2014-06-02: 125 ug via ORAL
  Filled 2014-06-02 (×2): qty 1
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
## 2014-06-02 NOTE — Progress Notes (Signed)
  CARE MANAGEMENT ED NOTE 06/02/2014  Patient:  Jacqueline [name], Jacqueline [name]   Account Number:  [id number]  Date Initiated:  06/02/2014  Documentation initiated by:  Edwyna Shell  Subjective/Objective Assessment:   48 yo female presenting to the ED with SI and requesting detox     Subjective/Objective Assessment Detail:     Action/Plan:   Patient stated she is aware of resources in the community that she can use. This Cm provided the patient with a GoodRx coupon card and list of local shelters and [facility].   Action/Plan Detail:   Anticipated DC Date:       Status Recommendation to Physician:   Result of Recommendation:  Agreed    DC Planning Services  CM consult  Medication Assistance  PCP issues    Choice offered to / List presented to:  C-1 Patient          Status of service:  Completed, signed off  ED Comments:   ED Comments Detail:  This CM spoke with the patient regarding her medication refills and appropriate resources. The patient stated that she is unsure if she will remain in [facility] or return to [place]. She stated that she has spoken with her daughter and she might return to the [place] area upon discharge. She stated that there she can go to [facility] for her [facility] medications and she can go to the health department for her Synthroid and she uses the [employer] $4 list. This CM encouraged her that if she remains in the [place] area to utilize the [facility] for medication refills and resources. The patient stated that she has used the [facility] resources in the past. This CM provided the patient with a GoodRx discount prescription card, and a list of local shelters. The patient was appreciative of the resources and aware of resources in the community. No other questions or concerns.

## 2014-06-04 NOTE — Consult Note (Signed)
Reviewed the information documented and agree with the treatment plan.   Ursula Alert ,MD Attending [facility]

## 2014-08-09 ENCOUNTER — Encounter (HOSPITAL_COMMUNITY): Payer: Self-pay | Admitting: Emergency Medicine

## 2014-08-14 ENCOUNTER — Emergency Department (HOSPITAL_COMMUNITY)
Admission: EM | Admit: 2014-08-14 | Discharge: 2014-08-14 | Discharge status: Against Medical Advice | Payer: Self-pay | Attending: Emergency Medicine | Admitting: Emergency Medicine

## 2014-08-14 ENCOUNTER — Encounter (HOSPITAL_COMMUNITY): Payer: Self-pay | Admitting: *Deleted

## 2014-08-14 DIAGNOSIS — G40909 Epilepsy, unspecified, not intractable, without status epilepticus: Secondary | ICD-10-CM | POA: Insufficient documentation

## 2014-08-14 DIAGNOSIS — Z9884 Bariatric surgery status: Secondary | ICD-10-CM | POA: Insufficient documentation

## 2014-08-14 DIAGNOSIS — Z862 Personal history of diseases of the blood and blood-forming organs and certain disorders involving the immune mechanism: Secondary | ICD-10-CM | POA: Insufficient documentation

## 2014-08-14 DIAGNOSIS — Z720 Tobacco use: Secondary | ICD-10-CM | POA: Insufficient documentation

## 2014-08-14 DIAGNOSIS — Z79899 Other long term (current) drug therapy: Secondary | ICD-10-CM | POA: Insufficient documentation

## 2014-08-14 DIAGNOSIS — F329 Major depressive disorder, single episode, unspecified: Secondary | ICD-10-CM | POA: Insufficient documentation

## 2014-08-14 DIAGNOSIS — E039 Hypothyroidism, unspecified: Secondary | ICD-10-CM | POA: Insufficient documentation

## 2014-08-14 DIAGNOSIS — F10920 Alcohol use, unspecified with intoxication, uncomplicated: Secondary | ICD-10-CM

## 2014-08-14 DIAGNOSIS — Z8711 Personal history of peptic ulcer disease: Secondary | ICD-10-CM | POA: Insufficient documentation

## 2014-08-14 DIAGNOSIS — F10120 Alcohol abuse with intoxication, uncomplicated: Secondary | ICD-10-CM | POA: Insufficient documentation

## 2014-08-14 LAB — COMPREHENSIVE METABOLIC PANEL
ALK PHOS: 106 U/L (ref 39–117)
ALT: 31 U/L (ref 0–35)
ANION GAP: 18 — AB (ref 5–15)
AST: 42 U/L — ABNORMAL HIGH (ref 0–37)
Albumin: 3.8 g/dL (ref 3.5–5.2)
BUN: 9 mg/dL (ref 6–23)
CO2: 22 mEq/L (ref 19–32)
CREATININE: 0.81 mg/dL (ref 0.50–1.10)
Calcium: 8.4 mg/dL (ref 8.4–10.5)
Chloride: 102 mEq/L (ref 96–112)
GFR calc non Af Amer: 86 mL/min — ABNORMAL LOW (ref >90)
Glucose, Bld: 100 mg/dL — ABNORMAL HIGH (ref 70–99)
Potassium: 3.4 mEq/L — ABNORMAL LOW (ref 3.7–5.3)
Sodium: 142 mEq/L (ref 137–147)
TOTAL PROTEIN: 7.6 g/dL (ref 6.0–8.3)

## 2014-08-14 LAB — CBC
HEMATOCRIT: 33.7 % — AB (ref 36.0–46.0)
Hemoglobin: 10.6 g/dL — ABNORMAL LOW (ref 12.0–15.0)
MCH: 26.4 pg (ref 26.0–34.0)
MCHC: 31.5 g/dL (ref 30.0–36.0)
MCV: 83.8 fL (ref 78.0–100.0)
Platelets: 509 10*3/uL — ABNORMAL HIGH (ref 150–400)
RBC: 4.02 MIL/uL (ref 3.87–5.11)
RDW: 18.4 % — AB (ref 11.5–15.5)
WBC: 6.1 10*3/uL (ref 4.0–10.5)

## 2014-08-14 LAB — ETHANOL: Alcohol, Ethyl (B): 345 mg/dL — ABNORMAL HIGH (ref 0–11)

## 2014-08-14 MED ORDER — LORAZEPAM 1 MG PO TABS: 0.0000 mg | ORAL_TABLET | Freq: Two times a day (BID) | ORAL | Status: DC

## 2014-08-14 MED ORDER — NICOTINE 21 MG/24HR TD PT24: 21.0000 mg | MEDICATED_PATCH | Freq: Every day | TRANSDERMAL | Status: DC

## 2014-08-14 MED ORDER — THIAMINE HCL 100 MG/ML IJ SOLN: 100.0000 mg | Freq: Every day | INTRAMUSCULAR | Status: DC

## 2014-08-14 MED ORDER — LORAZEPAM 1 MG PO TABS
0.0000 mg | ORAL_TABLET | Freq: Four times a day (QID) | ORAL | Status: DC
  Filled 2014-08-14: qty 1

## 2014-08-14 MED ORDER — ACETAMINOPHEN 325 MG PO TABS: 650.0000 mg | ORAL_TABLET | ORAL | Status: DC | PRN

## 2014-08-14 MED ORDER — ONDANSETRON HCL 4 MG PO TABS: 4.0000 mg | ORAL_TABLET | Freq: Three times a day (TID) | ORAL | Status: DC | PRN

## 2014-08-14 MED ORDER — VITAMIN B-1 100 MG PO TABS: 100.0000 mg | ORAL_TABLET | Freq: Every day | ORAL | Status: DC

## 2014-08-14 MED ORDER — ZOLPIDEM TARTRATE 5 MG PO TABS: 10.0000 mg | ORAL_TABLET | Freq: Every evening | ORAL | Status: DC | PRN

## 2014-08-14 MED ORDER — ALUM & MAG HYDROXIDE-SIMETH 200-200-20 MG/5ML PO SUSP: 30.0000 mL | ORAL | Status: DC | PRN

## 2014-08-14 NOTE — ED Notes (Signed)
Patient asleep, lying on stretcher, even rise and fall of chest noted.

## 2014-08-14 NOTE — ED Notes (Signed)
MD at bedside. 

## 2014-08-14 NOTE — ED Notes (Addendum)
Patient escorted back to room after changing scrubs due to bowel movement in scrub pants. Patient lying down on stretcher, refusing cardiac monitors.

## 2014-08-14 NOTE — ED Notes (Addendum)
Patient wanting ativan, however she does not meet criteria per CIWA. Patient threatening to leave, EDP made patient aware she can leave if that is her wish, but she does not require Ativan at this time.

## 2014-08-14 NOTE — ED Notes (Signed)
Patient escorted to bathroom

## 2014-08-14 NOTE — ED Notes (Signed)
Poison Control called, recommending lab work of ethanol, electrolytes, IV fluids if necessary, and to maintain blood sugar levels. Lab work already drawn that includes all recommendations.

## 2014-08-14 NOTE — ED Notes (Signed)
Pt states she drank a liter of Listerine today and has been drinking it all day. Pt laid on the ground outside on concrete after getting out of vehicle and kept saying that she gets psychotic when she drinks and "I apologize for the way I might act" . Pt is uncooperative. States she spoke with [facility] and they are supposed to get her in at [facility]

## 2014-08-14 NOTE — ED Notes (Addendum)
Patient given clothes to get dressed.

## 2014-08-14 NOTE — ED Provider Notes (Addendum)
CSN: [id number]     Arrival date & time 08/14/14  1308 History  This chart was scribe for No att. providers found by [name], ED Scribe. The patient was seen in room APA03/APA03 and the patient's care was started at 3:12 PM.    Chief Complaint  Patient presents with  . Drug / Alcohol Assessment   The history is provided by the patient. No language interpreter was used.   HPI Comments: Jacqueline [name] is a 48 y.o. female who presents to the Emergency Department for an alcohol abuse assessment. Pt states "I need to detox".  Patient states that she wants to quit drinking and needs to go to alcohol detox. She reports associated vomiting that she states is typical while she is drinking. She reports that she has been drinking 2 liters of Listerine in 24 hours for "a while". She states that she has had a drinking problem for the last 8 years and has relapsed a few times. She reports a 3 month sobriety a few months ago. She has been detox/been to a a rehabilitation facility 3 times this years, the last a few months ago and 20 times since she started drinking at age 35. She has been diagnosed with bi-polar disorder but has been non-compliant with her psych medication for a few days. She has also been non compliant with her seizure medications. She has not seen a neurologist for the seizures. She adds that the seizures happen during withdrawal or when she is using alcohol. She has had seizures since her late 70's and her last seizure was this summer. She reports smoking half a pack of cigarettes a day and denies drug abuse.She states she is depressed, then states she isn't, then states she is depressed because she is drinking and she wants to stop.  She also denies HI/SI.   She states that she currently lives with a friend who is giving her an ultimatum to get sober or she will have to leave the residence.   PCP: [facility]. Psychiatrist: Chinita Pester   Past Medical History   Diagnosis Date  . Depression   . Peptic ulcer   . Alcohol abuse   . Anemia   . Benzodiazepine abuse   . Thyroid disease   . Alcoholism   . Narcotic abuse   . Back pain   . Thrombocytopenia 06/17/2011  . Hypothyroidism   . Seizures   . Medical history non-contributory     hypoglycemic   Past Surgical History  Procedure Laterality Date  . Cholecystectomy    . Abdominal surgery    . Esophagogastroduodenoscopy    . Gastric bypass    . Tubal ligation    . Esophagogastroduodenoscopy N/A 03/23/2013    Procedure: ESOPHAGOGASTRODUODENOSCOPY (EGD);  Surgeon: Ladene Artist, MD;  Location: [facility];  Service: Endoscopy;  Laterality: N/A;  . No past surgeries      gastric bypass   Family History  Problem Relation Age of Onset  . Alcohol abuse Father   . Alcoholism Father   . Cancer Other    History  Substance Use Topics  . Smoking status: Current Every Day Smoker -- 1.00 packs/day for 12 years    Types: Cigarettes    Last Attempt to Quit: 08/08/2011  . Smokeless tobacco: Never Used  . Alcohol Use: Yes     Comment: drink 4 liters a day. 1 case/day drinks Listerine   Unemployed Lives with a friend Denies street drugs Drinks 2 liters  of Listerine in 24 hours  OB History    Gravida Para Term Preterm AB TAB SAB Ectopic Multiple Living   3 3  3      3      Review of Systems  Gastrointestinal: Positive for vomiting.      Allergies  Nsaids  Home Medications   Prior to Admission medications   Medication Sig Start Date End Date Taking? Authorizing Provider  carbamazepine (TEGRETOL XR) 100 MG 12 hr tablet Take 3 tablets (300 mg total) by mouth 2 (two) times daily. 05/28/14   Elmarie Shiley, NP  FLUoxetine (PROZAC) 20 MG capsule Take 3 capsules (60 mg total) by mouth daily. 05/28/14   Elmarie Shiley, NP  levothyroxine (SYNTHROID, LEVOTHROID) 125 MCG tablet Take 1 tablet (125 mcg total) by mouth daily before breakfast. For thyroid hormone replacement 12/23/13   Encarnacion Slates, NP   omeprazole (PRILOSEC) 40 MG capsule Take 1 capsule (40 mg total) by mouth daily. 05/28/14   Elmarie Shiley, NP  Patient states she's only taking levothyroid and Prilosec.   Filed Vitals:   08/14/14 1530  BP: 110/85  Pulse: 99  Temp: 98.4 F (36.9 C)  Resp: 22   Vital signs normal   Physical Exam  Constitutional: She is oriented to person, place, and time. She appears well-developed and well-nourished.  Non-toxic appearance. She does not appear ill. No distress.  HENT:  Head: Normocephalic and atraumatic.  Right Ear: External ear normal.  Left Ear: External ear normal.  Nose: Nose normal. No mucosal edema or rhinorrhea.  Mouth/Throat: Oropharynx is clear and moist and mucous membranes are normal. No dental abscesses or uvula swelling.  Eyes: Conjunctivae and EOM are normal. Pupils are equal, round, and reactive to light.  Neck: Normal range of motion and full passive range of motion without pain. Neck supple.  Cardiovascular: Normal rate, regular rhythm and normal heart sounds.  Exam reveals no gallop and no friction rub.   No murmur heard. Pulmonary/Chest: Effort normal and breath sounds normal. No respiratory distress. She has no wheezes. She has no rhonchi. She has no rales. She exhibits no tenderness and no crepitus.  Abdominal: Soft. Normal appearance and bowel sounds are normal. She exhibits no distension. There is no tenderness. There is no rebound and no guarding.  Musculoskeletal: Normal range of motion. She exhibits no edema or tenderness.  Moves all extremities well.   Neurological: She is alert and oriented to person, place, and time. She has normal strength. No cranial nerve deficit.  Skin: Skin is warm, dry and intact. No rash noted. No erythema. No pallor.  Psychiatric: She has a normal mood and affect. Her speech is normal and behavior is normal. Her mood appears not anxious.  Nursing note and vitals reviewed.   ED Course  Procedures (including critical care  time) Medications - No data to display  Patient remained intermittently agitated demanding benzodiazepines despite having a alcohol level in the mid 300 range. She was on psych holding orders and the CIWA protocol and she did not meet criteria for benzodiazepines yet. She has a past history of benzodiazepine abuse. She still had not given [name] a urine for a urine drug screen.She decided she would leave AMA. Patient was not felt to be at risk for suicidal or homicidal event. She was not significantly depressed requiring admission.   Review of her prior ED visits shows patient has had 9 ED visits in the past 6 months for alcohol abuse, polysubstance abuse, and homelessness.  Labs Review Results for orders placed or performed during the hospital encounter of 08/14/14  CBC  Result Value Ref Range   WBC 6.1 4.0 - 10.5 K/uL   RBC 4.02 3.87 - 5.11 MIL/uL   Hemoglobin 10.6 (L) 12.0 - 15.0 g/dL   HCT 33.7 (L) 36.0 - 46.0 %   MCV 83.8 78.0 - 100.0 fL   MCH 26.4 26.0 - 34.0 pg   MCHC 31.5 30.0 - 36.0 g/dL   RDW 18.4 (H) 11.5 - 15.5 %   Platelets 509 (H) 150 - 400 K/uL  Comprehensive metabolic panel  Result Value Ref Range   Sodium 142 137 - 147 mEq/L   Potassium 3.4 (L) 3.7 - 5.3 mEq/L   Chloride 102 96 - 112 mEq/L   CO2 22 19 - 32 mEq/L   Glucose, Bld 100 (H) 70 - 99 mg/dL   BUN 9 6 - 23 mg/dL   Creatinine, Ser 0.81 0.50 - 1.10 mg/dL   Calcium 8.4 8.4 - 10.5 mg/dL   Total Protein 7.6 6.0 - 8.3 g/dL   Albumin 3.8 3.5 - 5.2 g/dL   AST 42 (H) 0 - 37 U/L   ALT 31 0 - 35 U/L   Alkaline Phosphatase 106 39 - 117 U/L   Total Bilirubin <0.2 (L) 0.3 - 1.2 mg/dL   GFR calc non Af Amer 86 (L) >90 mL/min   GFR calc Af Amer >90 >90 mL/min   Anion gap 18 (H) 5 - 15  Ethanol (ETOH)  Result Value Ref Range   Alcohol, Ethyl (B) 345 (H) 0 - 11 mg/dL   Laboratory interpretation all normal except alcohol intoxication, minor elevation of LFTs, mild anemia    Imaging Review No results found.   EKG  Interpretation None      MDM   Final diagnoses:  Alcohol intoxication, uncomplicated    Pt left AMA   Rolland Porter, MD, FACEP   I personally performed the services described in this documentation, which was scribed in my presence. The recorded information has been reviewed and considered.  Rolland Porter, MD, FACEP     Janice Norrie, MD 08/14/14 [place] Barbee Mamula, MD 08/14/14 [phone]

## 2014-08-14 NOTE — ED Notes (Signed)
Patient is billigerent, stating if we don't give her some ativan right now so she "can sleep it off" that she will tear up her room. Patient ripped of monitors and started banging on walls, security called.

## 2014-08-30 ENCOUNTER — Encounter (HOSPITAL_COMMUNITY): Payer: Self-pay

## 2014-08-30 ENCOUNTER — Emergency Department (HOSPITAL_COMMUNITY)
Admission: EM | Admit: 2014-08-30 | Discharge: 2014-08-30 | Discharge status: Against Medical Advice | Payer: Self-pay | Attending: Emergency Medicine | Admitting: Emergency Medicine

## 2014-08-30 DIAGNOSIS — Y939 Activity, unspecified: Secondary | ICD-10-CM | POA: Insufficient documentation

## 2014-08-30 DIAGNOSIS — Z720 Tobacco use: Secondary | ICD-10-CM | POA: Insufficient documentation

## 2014-08-30 DIAGNOSIS — S1181XA Laceration without foreign body of other specified part of neck, initial encounter: Secondary | ICD-10-CM | POA: Insufficient documentation

## 2014-08-30 DIAGNOSIS — Y929 Unspecified place or not applicable: Secondary | ICD-10-CM | POA: Insufficient documentation

## 2014-08-30 DIAGNOSIS — Y998 Other external cause status: Secondary | ICD-10-CM | POA: Insufficient documentation

## 2014-08-30 NOTE — ED Notes (Signed)
No answer when called 
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
## 2014-08-30 NOTE — ED Notes (Signed)
No answer

## 2014-09-06 ENCOUNTER — Encounter (HOSPITAL_COMMUNITY): Payer: Self-pay | Admitting: *Deleted

## 2014-09-06 ENCOUNTER — Emergency Department (HOSPITAL_COMMUNITY)
Admission: EM | Admit: 2014-09-06 | Discharge: 2014-09-06 | Disposition: A | Discharge status: Home / Self Care | Payer: Self-pay | Attending: Emergency Medicine | Admitting: Emergency Medicine

## 2014-09-06 DIAGNOSIS — Z79899 Other long term (current) drug therapy: Secondary | ICD-10-CM | POA: Insufficient documentation

## 2014-09-06 DIAGNOSIS — Z9889 Other specified postprocedural states: Secondary | ICD-10-CM | POA: Insufficient documentation

## 2014-09-06 DIAGNOSIS — N390 Urinary tract infection, site not specified: Secondary | ICD-10-CM | POA: Insufficient documentation

## 2014-09-06 DIAGNOSIS — Z792 Long term (current) use of antibiotics: Secondary | ICD-10-CM | POA: Insufficient documentation

## 2014-09-06 DIAGNOSIS — Z9851 Tubal ligation status: Secondary | ICD-10-CM | POA: Insufficient documentation

## 2014-09-06 DIAGNOSIS — G40909 Epilepsy, unspecified, not intractable, without status epilepticus: Secondary | ICD-10-CM | POA: Insufficient documentation

## 2014-09-06 DIAGNOSIS — E039 Hypothyroidism, unspecified: Secondary | ICD-10-CM | POA: Insufficient documentation

## 2014-09-06 DIAGNOSIS — F329 Major depressive disorder, single episode, unspecified: Secondary | ICD-10-CM | POA: Insufficient documentation

## 2014-09-06 DIAGNOSIS — Z9089 Acquired absence of other organs: Secondary | ICD-10-CM | POA: Insufficient documentation

## 2014-09-06 DIAGNOSIS — Z862 Personal history of diseases of the blood and blood-forming organs and certain disorders involving the immune mechanism: Secondary | ICD-10-CM | POA: Insufficient documentation

## 2014-09-06 DIAGNOSIS — Z8719 Personal history of other diseases of the digestive system: Secondary | ICD-10-CM | POA: Insufficient documentation

## 2014-09-06 DIAGNOSIS — Z760 Encounter for issue of repeat prescription: Secondary | ICD-10-CM | POA: Insufficient documentation

## 2014-09-06 DIAGNOSIS — Z720 Tobacco use: Secondary | ICD-10-CM | POA: Insufficient documentation

## 2014-09-06 LAB — URINE MICROSCOPIC-ADD ON

## 2014-09-06 LAB — URINALYSIS, ROUTINE W REFLEX MICROSCOPIC
Bilirubin Urine: NEGATIVE
Glucose, UA: 250 mg/dL — AB
HGB URINE DIPSTICK: NEGATIVE
KETONES UR: 15 mg/dL — AB
NITRITE: POSITIVE — AB
Protein, ur: >300 mg/dL — AB
Specific Gravity, Urine: 1.015 (ref 1.005–1.030)
Urobilinogen, UA: >8 mg/dL — ABNORMAL HIGH (ref 0.0–1.0)
pH: 6.5 (ref 5.0–8.0)

## 2014-09-06 MED ORDER — LEVOTHYROXINE SODIUM 125 MCG PO TABS: 125.0000 ug | ORAL_TABLET | Freq: Every day | ORAL | Status: DC

## 2014-09-06 MED ORDER — CEPHALEXIN 500 MG PO CAPS: 500.0000 mg | ORAL_CAPSULE | Freq: Four times a day (QID) | ORAL | Status: DC

## 2014-09-06 NOTE — ED Notes (Signed)
Pt states she began having lower abdominal pain starting 2 days ago. Pt is having painful urination, frequency, and cloudy urine. Pt is taking AZO with no relief.
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
## 2014-09-06 NOTE — Discharge Instructions (Signed)
Urinary Tract Infection Urinary tract infections (UTIs) can develop anywhere along your urinary tract. Your urinary tract is your body's drainage system for removing wastes and extra water. Your urinary tract includes two kidneys, two ureters, a bladder, and a urethra. Your kidneys are a pair of bean-shaped organs. Each kidney is about the size of your fist. They are located below your ribs, one on each side of your spine. CAUSES Infections are caused by microbes, which are microscopic organisms, including fungi, viruses, and bacteria. These organisms are so small that they can only be seen through a microscope. Bacteria are the microbes that most commonly cause UTIs. SYMPTOMS  Symptoms of UTIs may vary by age and gender of the patient and by the location of the infection. Symptoms in young women typically include a frequent and intense urge to urinate and a painful, burning feeling in the bladder or urethra during urination. Older women and men are more likely to be tired, shaky, and weak and have muscle aches and abdominal pain. A fever may mean the infection is in your kidneys. Other symptoms of a kidney infection include pain in your back or sides below the ribs, nausea, and vomiting. DIAGNOSIS To diagnose a UTI, your caregiver will ask you about your symptoms. Your caregiver also will ask to provide a urine sample. The urine sample will be tested for bacteria and white blood cells. White blood cells are made by your body to help fight infection. TREATMENT  Typically, UTIs can be treated with medication. Because most UTIs are caused by a bacterial infection, they usually can be treated with the use of antibiotics. The choice of antibiotic and length of treatment depend on your symptoms and the type of bacteria causing your infection. HOME CARE INSTRUCTIONS  If you were prescribed antibiotics, take them exactly as your caregiver instructs you. Finish the medication even if you feel better after you  have only taken some of the medication.  Drink enough water and fluids to keep your urine clear or pale yellow.  Avoid caffeine, tea, and carbonated beverages. They tend to irritate your bladder.  Empty your bladder often. Avoid holding urine for long periods of time.  Empty your bladder before and after sexual intercourse.  After a bowel movement, women should cleanse from front to back. Use each tissue only once. SEEK MEDICAL CARE IF:   You have back pain.  You develop a fever.  Your symptoms do not begin to resolve within 3 days. SEEK IMMEDIATE MEDICAL CARE IF:   You have severe back pain or lower abdominal pain.  You develop chills.  You have nausea or vomiting.  You have continued burning or discomfort with urination. MAKE SURE YOU:   Understand these instructions.  Will watch your condition.  Will get help right away if you are not doing well or get worse. Document Released: 07/04/2005 Document Revised: 03/25/2012 Document Reviewed: 11/02/2011 [facility] Patient Information 2015 [place], [place]. This information is not intended to replace advice given to you by your health care provider. Make sure you discuss any questions you have with your health care provider.  Medication Refill, Emergency Department We have refilled your medication today as a courtesy to you. It is best for your medical care, however, to take care of getting refills done through your primary caregiver's office. They have your records and can do a better job of follow-up than we can in the emergency department. On maintenance medications, we often only prescribe enough medications to get you  by until you are able to see your regular caregiver. This is a more expensive way to refill medications. In the future, please plan for refills so that you will not have to use the emergency department for this. Thank you for your help. Your help allows [name] to better take care of the daily emergencies that enter our  department. Document Released: 01/11/2004 Document Revised: 12/17/2011 Document Reviewed: 01/01/2014 [facility] Patient Information 2015 [place], [place]. This information is not intended to replace advice given to you by your health care provider. Make sure you discuss any questions you have with your health care provider.

## 2014-09-07 LAB — URINE CULTURE
Colony Count: NO GROWTH
Culture: NO GROWTH

## 2014-10-15 ENCOUNTER — Emergency Department (HOSPITAL_COMMUNITY)
Admission: EM | Admit: 2014-10-15 | Discharge: 2014-10-17 | Disposition: A | Discharge status: Home / Self Care | Payer: No Typology Code available for payment source | Attending: Emergency Medicine | Admitting: Emergency Medicine

## 2014-10-15 ENCOUNTER — Encounter (HOSPITAL_COMMUNITY): Payer: Self-pay | Admitting: Emergency Medicine

## 2014-10-15 DIAGNOSIS — T50902A Poisoning by unspecified drugs, medicaments and biological substances, intentional self-harm, initial encounter: Secondary | ICD-10-CM

## 2014-10-15 DIAGNOSIS — F10230 Alcohol dependence with withdrawal, uncomplicated: Secondary | ICD-10-CM | POA: Diagnosis present

## 2014-10-15 DIAGNOSIS — T39312A Poisoning by propionic acid derivatives, intentional self-harm, initial encounter: Secondary | ICD-10-CM | POA: Diagnosis present

## 2014-10-15 DIAGNOSIS — T490X2A Poisoning by local antifungal, anti-infective and anti-inflammatory drugs, intentional self-harm, initial encounter: Secondary | ICD-10-CM | POA: Insufficient documentation

## 2014-10-15 DIAGNOSIS — F1010 Alcohol abuse, uncomplicated: Secondary | ICD-10-CM

## 2014-10-15 DIAGNOSIS — R45851 Suicidal ideations: Secondary | ICD-10-CM

## 2014-10-15 DIAGNOSIS — F1994 Other psychoactive substance use, unspecified with psychoactive substance-induced mood disorder: Secondary | ICD-10-CM | POA: Diagnosis present

## 2014-10-15 LAB — RAPID URINE DRUG SCREEN, HOSP PERFORMED
AMPHETAMINES: NOT DETECTED
BARBITURATES: NOT DETECTED
Benzodiazepines: POSITIVE — AB
Cocaine: NOT DETECTED
OPIATES: NOT DETECTED
Tetrahydrocannabinol: NOT DETECTED

## 2014-10-15 LAB — COMPREHENSIVE METABOLIC PANEL
ALK PHOS: 72 U/L (ref 39–117)
ALT: 32 U/L (ref 0–35)
ANION GAP: 11 (ref 5–15)
AST: 38 U/L — ABNORMAL HIGH (ref 0–37)
Albumin: 3.9 g/dL (ref 3.5–5.2)
BUN: 11 mg/dL (ref 6–23)
CO2: 23 mmol/L (ref 19–32)
CREATININE: 0.81 mg/dL (ref 0.50–1.10)
Calcium: 8.4 mg/dL (ref 8.4–10.5)
Chloride: 100 mEq/L (ref 96–112)
GFR calc non Af Amer: 86 mL/min — ABNORMAL LOW (ref >90)
GLUCOSE: 104 mg/dL — AB (ref 70–99)
Potassium: 4.2 mmol/L (ref 3.5–5.1)
SODIUM: 134 mmol/L — AB (ref 135–145)
TOTAL PROTEIN: 6.7 g/dL (ref 6.0–8.3)
Total Bilirubin: 0.2 mg/dL — ABNORMAL LOW (ref 0.3–1.2)

## 2014-10-15 LAB — I-STAT BETA HCG BLOOD, ED (MC, WL, AP ONLY): I-stat hCG, quantitative: <5 m[IU]/mL (ref <5)

## 2014-10-15 LAB — ETHANOL
Alcohol, Ethyl (B): 289 mg/dL — ABNORMAL HIGH (ref 0–9)
Alcohol, Ethyl (B): 185 mg/dL — ABNORMAL HIGH (ref 0–9)

## 2014-10-15 LAB — CBC WITH DIFFERENTIAL/PLATELET
Basophils Absolute: 0 10*3/uL (ref 0.0–0.1)
Basophils Relative: 1 % (ref 0–1)
Eosinophils Absolute: 0.2 10*3/uL (ref 0.0–0.7)
Eosinophils Relative: 3 % (ref 0–5)
HCT: 30.6 % — ABNORMAL LOW (ref 36.0–46.0)
HEMOGLOBIN: 9.4 g/dL — AB (ref 12.0–15.0)
LYMPHS ABS: 1.9 10*3/uL (ref 0.7–4.0)
LYMPHS PCT: 26 % (ref 12–46)
MCH: 25.8 pg — AB (ref 26.0–34.0)
MCHC: 30.7 g/dL (ref 30.0–36.0)
MCV: 83.8 fL (ref 78.0–100.0)
Monocytes Absolute: 0.7 10*3/uL (ref 0.1–1.0)
Monocytes Relative: 10 % (ref 3–12)
Neutro Abs: 4.4 10*3/uL (ref 1.7–7.7)
Neutrophils Relative %: 61 % (ref 43–77)
PLATELETS: 251 10*3/uL (ref 150–400)
RBC: 3.65 MIL/uL — AB (ref 3.87–5.11)
RDW: 21.4 % — AB (ref 11.5–15.5)
WBC: 7.2 10*3/uL (ref 4.0–10.5)

## 2014-10-15 LAB — SALICYLATE LEVEL: Salicylate Lvl: <4 mg/dL (ref 2.8–20.0)

## 2014-10-15 LAB — POC OCCULT BLOOD, ED: Fecal Occult Bld: NEGATIVE

## 2014-10-15 LAB — ACETAMINOPHEN LEVEL: Acetaminophen (Tylenol), Serum: <10 ug/mL — ABNORMAL LOW (ref 10–30)

## 2014-10-15 MED ORDER — LORAZEPAM 2 MG/ML IJ SOLN: 0.0000 mg | Freq: Two times a day (BID) | INTRAMUSCULAR | Status: DC

## 2014-10-15 MED ORDER — LORAZEPAM 2 MG/ML IJ SOLN
INTRAMUSCULAR | Status: AC
  Filled 2014-10-15: qty 1

## 2014-10-15 MED ORDER — LORAZEPAM 2 MG/ML IJ SOLN: 0.0000 mg | Freq: Four times a day (QID) | INTRAMUSCULAR | Status: DC

## 2014-10-15 MED ORDER — NICOTINE 14 MG/24HR TD PT24
14.0000 mg | MEDICATED_PATCH | Freq: Every day | TRANSDERMAL | Status: DC
Start: 2014-10-15 — End: 2014-10-16
  Administered 2014-10-15 – 2014-10-16 (×2): 14 mg via TRANSDERMAL
  Filled 2014-10-15 (×2): qty 1

## 2014-10-15 MED ORDER — THIAMINE HCL 100 MG/ML IJ SOLN: 100.0000 mg | Freq: Every day | INTRAMUSCULAR | Status: DC

## 2014-10-15 MED ORDER — VITAMIN B-1 100 MG PO TABS
100.0000 mg | ORAL_TABLET | Freq: Every day | ORAL | Status: DC
  Administered 2014-10-15 – 2014-10-16 (×2): 100 mg via ORAL
  Filled 2014-10-15 (×2): qty 1

## 2014-10-15 MED ORDER — LORAZEPAM 2 MG/ML IJ SOLN
1.0000 mg | Freq: Once | INTRAMUSCULAR | Status: AC
  Administered 2014-10-15: 1 mg via INTRAMUSCULAR

## 2014-10-15 MED ORDER — LORAZEPAM 2 MG/ML IJ SOLN
1.0000 mg | Freq: Once | INTRAMUSCULAR | Status: DC
Start: 2014-10-15 — End: 2014-10-15

## 2014-10-15 MED ORDER — ONDANSETRON HCL 4 MG PO TABS
4.0000 mg | ORAL_TABLET | Freq: Three times a day (TID) | ORAL | Status: DC | PRN
  Administered 2014-10-15: 4 mg via ORAL
  Filled 2014-10-15: qty 1

## 2014-10-15 MED ORDER — LORAZEPAM 1 MG PO TABS
0.0000 mg | ORAL_TABLET | Freq: Four times a day (QID) | ORAL | Status: DC
  Administered 2014-10-15: 2 mg via ORAL
  Administered 2014-10-16 (×2): 1 mg via ORAL
  Filled 2014-10-15: qty 2
  Filled 2014-10-15: qty 1

## 2014-10-15 MED ORDER — LORAZEPAM 1 MG PO TABS
0.0000 mg | ORAL_TABLET | Freq: Two times a day (BID) | ORAL | Status: DC
  Filled 2014-10-15: qty 1

## 2014-10-15 NOTE — ED Notes (Signed)
Spoke with Beth at [facility] according to her since patient took less than or equal to (roughly) 200 mg per kg the main symptoms patient is expected to experience includes: abdominal pain, nausea, vomiting, blurred/doubled vision, headache, drowsiness and ringing in the ears. Her recommendations at this time include: 12 lead EKG , IVF, labs, and to observe patient at least 6 hours or until patient is asymptomatic. Will inform Dr. Christy Gentles of the above.

## 2014-10-15 NOTE — ED Notes (Signed)
Per patients roommate, who brought patient here, he walked in on her today taking the Ibuprofen, he also stated that patient has been trying to kill herself all this week, and while patient was in [place], she was drinking hand sanitizer.

## 2014-10-15 NOTE — ED Notes (Addendum)
Patient states she was seen as at [facility] and admitted for ETOH detox, but left yesterday AMA. Patient oriented times 4, alert yet slurring words. We have a ED tech functioning as a sitter in patients room, for her safety.

## 2014-10-15 NOTE — ED Notes (Signed)
Pt unable to participate in TTS, per [facility] the consult was DC and will be ordered again when the pt is able to participate. [place] will call back at 1800 to check on the pt's status.

## 2014-10-15 NOTE — ED Notes (Signed)
[facility] called to check on patient. Updated on patients VS and lab values. No further suggestions at this time.

## 2014-10-15 NOTE — ED Notes (Signed)
Pt brought in by POV, friend states she took 100 Ibuprofen this am in suicide attempt. Pt states she tried to kill herself.

## 2014-10-15 NOTE — BH Assessment (Signed)
Writer informed TTS Brandi of the consult.  

## 2014-10-15 NOTE — ED Provider Notes (Signed)
CSN: [id number]     Arrival date & time 10/15/14  1139 History  This chart was scribed for Sharyon Cable, MD by Dellis Filbert, ED Scribe. The patient was seen in [place] and the patient's care was started at 12:33 PM.  Chief Complaint  Patient presents with  . V70.1    Patient is a 49 y.o. female presenting with Overdose. The history is provided by the patient. No language interpreter was used.  Drug Overdose This is a new problem. The current episode started 1 to 2 hours ago. The problem has not changed since onset.Pertinent negatives include no chest pain, no abdominal pain and no headaches. Nothing aggravates the symptoms. Nothing relieves the symptoms. She has tried nothing for the symptoms. The treatment provided no relief.    HPI Comments: Jacqueline [name] is a 49 y.o. female who presents to the Emergency Department complaining of an ibuprofen overdose, onset 1 hour ago. She took two handfulls. She also drank EtOH. She also took prilosec and synthroid.  She reports she had blood in her stool yesterday. Pt was seen yesterday at [facility] for EtOH detox but left AMA. Pt did have plans to hurt herself and suicidal ideation. She denies vomiting, nausea, diarrhea, CP, abdominal pain, nausea.   Past Medical History  Diagnosis Date  . Depression   . Peptic ulcer   . Alcohol abuse   . Anemia   . Benzodiazepine abuse   . Thyroid disease   . Alcoholism   . Narcotic abuse   . Back pain   . Thrombocytopenia 06/17/2011  . Hypothyroidism   . Seizures   . Medical history non-contributory     hypoglycemic   Past Surgical History  Procedure Laterality Date  . Cholecystectomy    . Abdominal surgery    . Esophagogastroduodenoscopy    . Gastric bypass    . Tubal ligation    . Esophagogastroduodenoscopy N/A 03/23/2013    Procedure: ESOPHAGOGASTRODUODENOSCOPY (EGD);  Surgeon: Ladene Artist, MD;  Location: [facility];  Service: Endoscopy;  Laterality: N/A;  . No past  surgeries      gastric bypass   Family History  Problem Relation Age of Onset  . Alcohol abuse Father   . Alcoholism Father   . Cancer Other    History  Substance Use Topics  . Smoking status: Current Every Day Smoker -- 1.00 packs/day for 12 years    Types: Cigarettes    Last Attempt to Quit: 08/08/2011  . Smokeless tobacco: Never Used  . Alcohol Use: Yes     Comment: drink 4 liters a day. 1 case/day drinks Listerine   OB History    Gravida Para Term Preterm AB TAB SAB Ectopic Multiple Living   3 3  3      3      Review of Systems  Cardiovascular: Negative for chest pain.  Gastrointestinal: Positive for blood in stool. Negative for nausea, vomiting, abdominal pain and diarrhea.  Neurological: Negative for headaches.  Psychiatric/Behavioral: Positive for suicidal ideas and self-injury.  All other systems reviewed and are negative.   Allergies  Nsaids  Home Medications   Prior to Admission medications   Medication Sig Start Date End Date Taking? Authorizing Provider  carbamazepine (TEGRETOL XR) 100 MG 12 hr tablet Take 3 tablets (300 mg total) by mouth 2 (two) times daily. 05/28/14   Elmarie Shiley, NP  cephALEXin (KEFLEX) 500 MG capsule Take 1 capsule (500 mg total) by mouth 4 (four) times daily.  09/06/14   Evalee Jefferson, PA-C  FLUoxetine (PROZAC) 20 MG capsule Take 3 capsules (60 mg total) by mouth daily. 05/28/14   Elmarie Shiley, NP  levothyroxine (SYNTHROID, LEVOTHROID) 125 MCG tablet Take 1 tablet (125 mcg total) by mouth daily before breakfast. For thyroid hormone replacement 12/23/13   Encarnacion Slates, NP  levothyroxine (SYNTHROID, LEVOTHROID) 125 MCG tablet Take 1 tablet (125 mcg total) by mouth daily before breakfast. 09/06/14   Evalee Jefferson, PA-C  omeprazole (PRILOSEC) 40 MG capsule Take 1 capsule (40 mg total) by mouth daily. 05/28/14   Elmarie Shiley, NP   BP 130/96 mmHg  Pulse 80  Temp(Src) 98.2 F (36.8 C) (Oral)  Resp 24  Ht 5\' 9"  (1.753 m)  Wt 180 lb (81.647 kg)  BMI  26.57 kg/m2  SpO2 97%  LMP  (LMP Unknown) Physical Exam  Nursing note and vitals reviewed.  CONSTITUTIONAL: disheveled and smells of EtOH HEAD: Normocephalic/atraumatic EYES: EOMI/PERRL ENMT: Mucous membranes moist NECK: supple no meningeal signs SPINE/BACK:entire spine nontender CV: S1/S2 noted, no murmurs/rubs/gallops noted LUNGS: Lungs are clear to auscultation bilaterally, no apparent distress ABDOMEN: soft, nontender, no rebound or guarding, bowel sounds noted throughout abdomen GU:no cva tenderness NEURO: Pt is awake/alert/appropriate, moves all extremitiesx4.  No facial droop.   EXTREMITIES: pulses normal/equal, full ROM SKIN: warm, color normal PSYCH: anxious  ED Course  Procedures   CRITICAL CARE Performed by: Sharyon Cable Total critical care time: 33 Critical care time was exclusive of separately billable procedures and treating other patients. Critical care was necessary to treat or prevent imminent or life-threatening deterioration. Critical care was time spent personally by me on the following activities: development of treatment plan with patient and/or surrogate as well as nursing, discussions with consultants, evaluation of patient's response to treatment, examination of patient, obtaining history from patient or surrogate, ordering and performing treatments and interventions, ordering and review of laboratory studies, ordering and review of radiographic studies, pulse oximetry and re-evaluation of patient's condition. PATIENT HERE  WITH OVERDOSE TO HARM HERSELF, SHE IS AGITATED AND REQUIRED IM SEDATION DIAGNOSTIC STUDIES: Oxygen Saturation is 97% on room air, normal by my interpretation.    COORDINATION OF CARE: 12:37 PM Discussed treatment plan with pt at bedside and pt agreed to plan. PATIENT BECAME AGITATED, THREATENING TO LEAVE AND IS A CLEAR THREAT TO HERSELF ATIVAN ORDERED FOR SEDATION REVIEW OF RECORDS FROM [facility] RECENT ADMISSION FOR ETOH  INTOXICATION PT REPORTED RECENT RECTAL BLEEDING BUT NURSE OBTAINED HEMOCCULT THAT WAS NEGATIVE  2:07 PM Pt will need repeat apap level at 1600 (reports she took ibuprofen but pt unreliable) Pt is otherwise medically stable and resting comfortably  Labs Review Labs Reviewed  COMPREHENSIVE METABOLIC PANEL - Abnormal; Notable for the following:    Sodium 134 (*)    Glucose, Bld 104 (*)    AST 38 (*)    Total Bilirubin 0.2 (*)    GFR calc non Af Amer 86 (*)    All other components within normal limits  CBC WITH DIFFERENTIAL - Abnormal; Notable for the following:    RBC 3.65 (*)    Hemoglobin 9.4 (*)    HCT 30.6 (*)    MCH 25.8 (*)    RDW 21.4 (*)    All other components within normal limits  ACETAMINOPHEN LEVEL - Abnormal; Notable for the following:    Acetaminophen (Tylenol), Serum <10.0 (*)    All other components within normal limits  ETHANOL - Abnormal; Notable for the following:  Alcohol, Ethyl (B) 289 (*)    All other components within normal limits  URINE RAPID DRUG SCREEN (HOSP PERFORMED) - Abnormal; Notable for the following:    Benzodiazepines POSITIVE (*)    All other components within normal limits  SALICYLATE LEVEL  I-STAT BETA HCG BLOOD, ED ([facility], [facility], [facility] ONLY)  POC OCCULT BLOOD, ED     EKG Interpretation   Date/Time:  Friday October 15 2014 12:07:46 EST Ventricular Rate:  71 PR Interval:  130 QRS Duration: 104 QT Interval:  433 QTC Calculation: 471 R Axis:   73 Text Interpretation:  Sinus rhythm Normal ECG No significant change since  last tracing Confirmed by Christy Gentles  MD, Elenore Rota ([id number]) on 10/15/2014  12:19:42 PM     Medications  LORazepam (ATIVAN) injection 1 mg (1 mg Intramuscular Given 10/15/14 1245)    MDM   Final diagnoses:  Suicidal ideation  Overdose, intentional self-harm, initial encounter  Alcohol abuse     Nursing notes including past medical history and social history reviewed and considered in documentation Labs/vital reviewed  myself and considered during evaluation   I personally performed the services described in this documentation, which was scribed in my presence. The recorded information has been reviewed and is accurate.      Sharyon Cable, MD 10/15/14 1409
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
## 2014-10-15 NOTE — ED Notes (Signed)
Patient up walking toward door in room, unsteady stating she "was going to leave", myself and other nurses in patients room. Patient has pulled out her IV and stating she "wants to leave", Dr. Christy Gentles informed, into see patient.  Order for Ativan 1mg  IM received and given.

## 2014-10-15 NOTE — [facility] Counselor (Signed)
Counselor spoke with RN-Greg. Per Marya Amsler Pt is intoxicated and cannot be assessed at this time. Counselor asked Marya Amsler to discontinue current consult and resubmit once the Pt is able to be assessed. Per Marya Amsler he discontinued the current consult and will resubmit.   Lorenza Cambridge, [facility] Triage Specialist

## 2014-10-16 ENCOUNTER — Emergency Department (HOSPITAL_COMMUNITY): Admission: EM | Admit: 2014-10-16 | Discharge: 2014-10-16 | Discharge status: Absent without leave | Payer: Self-pay

## 2014-10-16 DIAGNOSIS — F1099 Alcohol use, unspecified with unspecified alcohol-induced disorder: Secondary | ICD-10-CM

## 2014-10-16 DIAGNOSIS — F319 Bipolar disorder, unspecified: Secondary | ICD-10-CM | POA: Insufficient documentation

## 2014-10-16 DIAGNOSIS — R45851 Suicidal ideations: Secondary | ICD-10-CM

## 2014-10-16 DIAGNOSIS — T50901A Poisoning by unspecified drugs, medicaments and biological substances, accidental (unintentional), initial encounter: Secondary | ICD-10-CM | POA: Insufficient documentation

## 2014-10-16 DIAGNOSIS — F1010 Alcohol abuse, uncomplicated: Secondary | ICD-10-CM | POA: Insufficient documentation

## 2014-10-16 DIAGNOSIS — F3130 Bipolar disorder, current episode depressed, mild or moderate severity, unspecified: Secondary | ICD-10-CM

## 2014-10-16 MED ORDER — CARBAMAZEPINE ER 200 MG PO TB12
300.0000 mg | ORAL_TABLET | Freq: Two times a day (BID) | ORAL | Status: DC
  Administered 2014-10-16 – 2014-10-17 (×2): 300 mg via ORAL
  Filled 2014-10-16 (×3): qty 1

## 2014-10-16 MED ORDER — ZOLPIDEM TARTRATE 5 MG PO TABS
5.0000 mg | ORAL_TABLET | Freq: Every evening | ORAL | Status: DC | PRN
  Administered 2014-10-16: 5 mg via ORAL
  Filled 2014-10-16: qty 1

## 2014-10-16 MED ORDER — LEVOTHYROXINE SODIUM 125 MCG PO TABS
125.0000 ug | ORAL_TABLET | Freq: Every day | ORAL | Status: DC
  Administered 2014-10-17: 125 ug via ORAL
  Filled 2014-10-16 (×2): qty 1

## 2014-10-16 MED ORDER — PANTOPRAZOLE SODIUM 40 MG PO TBEC
40.0000 mg | DELAYED_RELEASE_TABLET | Freq: Every day | ORAL | Status: DC
  Administered 2014-10-16: 40 mg via ORAL
  Filled 2014-10-16: qty 1

## 2014-10-16 MED ORDER — LEVOTHYROXINE SODIUM 50 MCG PO TABS
125.0000 ug | ORAL_TABLET | Freq: Every day | ORAL | Status: DC
  Administered 2014-10-16: 125 ug via ORAL
  Filled 2014-10-16: qty 3

## 2014-10-16 MED ORDER — LORAZEPAM 1 MG PO TABS
1.0000 mg | ORAL_TABLET | Freq: Four times a day (QID) | ORAL | Status: DC | PRN
  Administered 2014-10-16: 1 mg via ORAL
  Filled 2014-10-16: qty 1

## 2014-10-16 MED ORDER — ADULT MULTIVITAMIN W/MINERALS CH
1.0000 | ORAL_TABLET | Freq: Every day | ORAL | Status: DC
  Filled 2014-10-16 (×2): qty 1

## 2014-10-16 MED ORDER — FLUOXETINE HCL 20 MG PO CAPS
60.0000 mg | ORAL_CAPSULE | Freq: Every day | ORAL | Status: DC
  Administered 2014-10-16 – 2014-10-17 (×2): 60 mg via ORAL
  Filled 2014-10-16 (×2): qty 3

## 2014-10-16 MED ORDER — SENNOSIDES-DOCUSATE SODIUM 8.6-50 MG PO TABS
1.0000 | ORAL_TABLET | Freq: Two times a day (BID) | ORAL | Status: DC | PRN
  Administered 2014-10-16: 1 via ORAL
  Filled 2014-10-16: qty 1

## 2014-10-16 NOTE — ED Notes (Signed)
Emeline General at [facility] called. Patient can be transferred to [name] pending acceptance by MD there. Dr Jeanell Sparrow notified.
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
## 2014-10-16 NOTE — ED Notes (Signed)
Opened chart to review pt notes, [facility] at [facility] called asking we consider taking pt in our SAPPU.

## 2014-10-16 NOTE — BH Assessment (Signed)
Jacqueline [name], [facility] at [facility], confirms adult unit is currently at capacity. Contacted the following facilities for placement:  INFORMATION FAXED, PT UNDER REVIEW: [place]  AT CAPACITY: [facility], per [facility], per [facility], per [facility], per [facility], per [facility], per [facility], per [facility], per [facility], per [facility], per [facility], per [facility], per [facility], per [facility], per [facility] [place] [place]   [place], [place], [facility] Triage Specialist [phone]

## 2014-10-16 NOTE — ED Notes (Signed)
Called [facility] for transport to [facility].
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
## 2014-10-16 NOTE — ED Notes (Signed)
Patient presents anxious and focused on medications; denies thoughts of self harm or thoughts of harming others; negative AVH. NAD

## 2014-10-16 NOTE — Progress Notes (Signed)
CSW continued placement efforts for patient:  Referrals sent for consideration: [facility]: [facility] not being able to do a doc to doc consult.   Facilities at Capacity: [place]  Patient needs continued placement efforts.  Hillery Hunter, LCSW Disposition Social Worker [phone]

## 2014-10-16 NOTE — ED Notes (Signed)
Patient resting in bed quietlly eating breakfast. Airway patent. No acute distress noted. Patient asking if she is still " voluntary." Patient informed that she is at this time. Patient asking if she can sign herself out. Patient informed that EDP would have to be notified since she was suicidal and that she could possibly be made involuntary by EDP. Patient states "I only said I was suicidal because I was really drunk. I am not suicidal." EDP made aware TTS to be ordered-patient made aware, verbalized understanding.

## 2014-10-16 NOTE — ED Notes (Signed)
Spoke with [facility] who will work on having pt transferred. to [facility]

## 2014-10-16 NOTE — ED Notes (Signed)
Patient's AA sponsor was request to speak to charge RN. Out to speak with him and he states that he brought her up here yesterday and felt "like I was brushed off. I know all about her and what's going on. No one was interested in hearing about me or told her I was here to see her" Patient's sponsor was updated on visiting hours and stated he would be back to see her at noon.

## 2014-10-16 NOTE — BH Assessment (Signed)
Jacqueline [name], [facility], confirms adult unit is currently at capacity. Contacted the following following facilities for placement:  BED AVAILABLE, FAXED CLINICAL INFORMATION: [facility], per [employer] [facility]  AT CAPACITY: [place], per [facility], per [facility], per [facility], per [facility], per [facility], per [facility], per [facility], per [facility], per [facility], per [facility], per [facility], per [facility], per [facility], per [place]  NO RESPONSE: [facility]   [place], [place], [facility] Triage Specialist [phone]

## 2014-10-16 NOTE — ED Provider Notes (Addendum)
Informed by rn that patient wants to leave.  Reevaluation by psychiatry states they will not clear overdose until 48 hours.  Patient informed and will stay voluntarily.  Home ppi and thyroid meds added per patient request.  Results for orders placed or performed during the hospital encounter of 10/15/14  Comprehensive metabolic panel  Result Value Ref Range   Sodium 134 (L) 135 - 145 mmol/L   Potassium 4.2 3.5 - 5.1 mmol/L   Chloride 100 96 - 112 mEq/L   CO2 23 19 - 32 mmol/L   Glucose, Bld 104 (H) 70 - 99 mg/dL   BUN 11 6 - 23 mg/dL   Creatinine, Ser 0.81 0.50 - 1.10 mg/dL   Calcium 8.4 8.4 - 10.5 mg/dL   Total Protein 6.7 6.0 - 8.3 g/dL   Albumin 3.9 3.5 - 5.2 g/dL   AST 38 (H) 0 - 37 U/L   ALT 32 0 - 35 U/L   Alkaline Phosphatase 72 39 - 117 U/L   Total Bilirubin 0.2 (L) 0.3 - 1.2 mg/dL   GFR calc non Af Amer 86 (L) >90 mL/min   GFR calc Af Amer >90 >90 mL/min   Anion gap 11 5 - 15  CBC with Differential  Result Value Ref Range   WBC 7.2 4.0 - 10.5 K/uL   RBC 3.65 (L) 3.87 - 5.11 MIL/uL   Hemoglobin 9.4 (L) 12.0 - 15.0 g/dL   HCT 30.6 (L) 36.0 - 46.0 %   MCV 83.8 78.0 - 100.0 fL   MCH 25.8 (L) 26.0 - 34.0 pg   MCHC 30.7 30.0 - 36.0 g/dL   RDW 21.4 (H) 11.5 - 15.5 %   Platelets 251 150 - 400 K/uL   Neutrophils Relative % 61 43 - 77 %   Neutro Abs 4.4 1.7 - 7.7 K/uL   Lymphocytes Relative 26 12 - 46 %   Lymphs Abs 1.9 0.7 - 4.0 K/uL   Monocytes Relative 10 3 - 12 %   Monocytes Absolute 0.7 0.1 - 1.0 K/uL   Eosinophils Relative 3 0 - 5 %   Eosinophils Absolute 0.2 0.0 - 0.7 K/uL   Basophils Relative 1 0 - 1 %   Basophils Absolute 0.0 0.0 - 0.1 K/uL  Acetaminophen level  Result Value Ref Range   Acetaminophen (Tylenol), Serum <10.0 (L) 10 - 30 ug/mL  Ethanol  Result Value Ref Range   Alcohol, Ethyl (B) 289 (H) 0 - 9 mg/dL  Urine rapid drug screen (hosp performed)  Result Value Ref Range   Opiates NONE DETECTED NONE DETECTED   Cocaine NONE DETECTED NONE DETECTED   Benzodiazepines POSITIVE (A) NONE DETECTED   Amphetamines NONE DETECTED NONE DETECTED   Tetrahydrocannabinol NONE DETECTED NONE DETECTED   Barbiturates NONE DETECTED NONE DETECTED  Salicylate level  Result Value Ref Range   Salicylate Lvl <7.5 2.8 - 20.0 mg/dL  Acetaminophen level  Result Value Ref Range   Acetaminophen (Tylenol), Serum <10.0 (L) 10 - 30 ug/mL  Ethanol  Result Value Ref Range   Alcohol, Ethyl (B) 185 (H) 0 - 9 mg/dL  I-Stat Beta hCG blood, ED ([facility], [facility], [facility] only)  Result Value Ref Range   I-stat hCG, quantitative <5.0 <5 mIU/mL   Comment 3          POC occult blood, ED RN will collect  Result Value Ref Range   Fecal Occult Bld NEGATIVE NEGATIVE     Labs reviewed- patient with anemia but stable from prior with  normal hr and bp now.    Shaune Pollack, MD 10/16/14 1014  Patient to be transferred to [facility] ED for psych treatment and disposition.   Shaune Pollack, MD 10/16/14 1245

## 2014-10-17 DIAGNOSIS — F4310 Post-traumatic stress disorder, unspecified: Secondary | ICD-10-CM

## 2014-10-17 DIAGNOSIS — T39312A Poisoning by propionic acid derivatives, intentional self-harm, initial encounter: Secondary | ICD-10-CM

## 2014-10-17 DIAGNOSIS — F1994 Other psychoactive substance use, unspecified with psychoactive substance-induced mood disorder: Secondary | ICD-10-CM | POA: Diagnosis present

## 2014-10-17 DIAGNOSIS — F332 Major depressive disorder, recurrent severe without psychotic features: Secondary | ICD-10-CM | POA: Insufficient documentation

## 2014-10-17 DIAGNOSIS — F10230 Alcohol dependence with withdrawal, uncomplicated: Secondary | ICD-10-CM | POA: Diagnosis present

## 2014-10-17 DIAGNOSIS — T50901A Poisoning by unspecified drugs, medicaments and biological substances, accidental (unintentional), initial encounter: Secondary | ICD-10-CM | POA: Insufficient documentation

## 2014-10-17 LAB — URINALYSIS, ROUTINE W REFLEX MICROSCOPIC
GLUCOSE, UA: NEGATIVE mg/dL
Hgb urine dipstick: NEGATIVE
Ketones, ur: NEGATIVE mg/dL
Nitrite: NEGATIVE
Protein, ur: NEGATIVE mg/dL
Specific Gravity, Urine: 1.018 (ref 1.005–1.030)
UROBILINOGEN UA: 1 mg/dL (ref 0.0–1.0)
pH: 5 (ref 5.0–8.0)

## 2014-10-17 LAB — URINE MICROSCOPIC-ADD ON

## 2014-10-17 MED ORDER — LORAZEPAM 2 MG/ML IJ SOLN: 1.0000 mg | Freq: Four times a day (QID) | INTRAMUSCULAR | Status: DC | PRN

## 2014-10-17 MED ORDER — LEVOTHYROXINE SODIUM 125 MCG PO TABS: 125.0000 ug | ORAL_TABLET | Freq: Every day | ORAL | Status: DC

## 2014-10-17 MED ORDER — VITAMIN B-1 100 MG PO TABS: 100.0000 mg | ORAL_TABLET | Freq: Every day | ORAL | Status: DC

## 2014-10-17 MED ORDER — CEPHALEXIN 500 MG PO CAPS: 500.0000 mg | ORAL_CAPSULE | Freq: Three times a day (TID) | ORAL | Status: DC

## 2014-10-17 MED ORDER — LORAZEPAM 1 MG PO TABS: 1.0000 mg | ORAL_TABLET | Freq: Four times a day (QID) | ORAL | Status: DC | PRN

## 2014-10-17 MED ORDER — FOLIC ACID 1 MG PO TABS: 1.0000 mg | ORAL_TABLET | Freq: Every day | ORAL | Status: DC

## 2014-10-17 MED ORDER — THIAMINE HCL 100 MG/ML IJ SOLN: 100.0000 mg | Freq: Every day | INTRAMUSCULAR | Status: DC

## 2014-10-17 MED ORDER — LORAZEPAM 1 MG PO TABS: 0.0000 mg | ORAL_TABLET | Freq: Two times a day (BID) | ORAL | Status: DC

## 2014-10-17 MED ORDER — CEPHALEXIN 500 MG PO CAPS: 500.0000 mg | ORAL_CAPSULE | Freq: Four times a day (QID) | ORAL | Status: DC

## 2014-10-17 MED ORDER — CARBAMAZEPINE ER 100 MG PO TB12: 300.0000 mg | ORAL_TABLET | Freq: Two times a day (BID) | ORAL | Status: DC

## 2014-10-17 MED ORDER — NICOTINE 14 MG/24HR TD PT24
14.0000 mg | MEDICATED_PATCH | Freq: Once | TRANSDERMAL | Status: DC
  Administered 2014-10-17: 14 mg via TRANSDERMAL
  Filled 2014-10-17: qty 1

## 2014-10-17 MED ORDER — FLUOXETINE HCL 20 MG PO CAPS: 60.0000 mg | ORAL_CAPSULE | Freq: Every day | ORAL | Status: DC

## 2014-10-17 MED ORDER — LORAZEPAM 1 MG PO TABS
0.0000 mg | ORAL_TABLET | Freq: Four times a day (QID) | ORAL | Status: DC
  Administered 2014-10-17: 1 mg via ORAL
  Filled 2014-10-17: qty 1

## 2014-10-17 MED ORDER — OMEPRAZOLE 40 MG PO CPDR: 40.0000 mg | DELAYED_RELEASE_CAPSULE | Freq: Every day | ORAL | Status: DC

## 2014-10-17 MED ORDER — ADULT MULTIVITAMIN W/MINERALS CH: 1.0000 | ORAL_TABLET | Freq: Every day | ORAL | Status: DC
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
## 2014-10-17 NOTE — [facility] Suicide Risk Assessment (Signed)
Suicide Risk Assessment  Discharge Assessment     Demographic Factors:  Caucasian  Total Time spent with patient: 45 minutes  Psychiatric Specialty Exam:     Blood pressure 98/46, pulse 64, temperature 98 F (36.7 C), temperature source Oral, resp. rate 18, height 5\' 9"  (1.753 m), weight 180 lb (81.647 kg), SpO2 98 %.Body mass index is 26.57 kg/(m^2).  General Appearance: Casual and Disheveled  Eye Contact::  Good  Speech:  Clear and Coherent and Normal Rate  Volume:  Normal  Mood:  Euthymic  Affect:  Congruent   Thought Process:  Coherent, Goal Directed and Intact  Orientation:  Full (Time, Place, and Person)  Thought Content:  WDL  Suicidal Thoughts:  No  Homicidal Thoughts:  No  Memory:  Good  Judgement:  Fair  Insight:  Fair  Psychomotor Activity:  WDL  Concentration: Good  Recall:  NA  Fund of Knowledge: Good  Language: Good  Akathisia:  NA  Handed:  Right  AIMS (if indicated):     Assets:  Desire for Improvement  Sleep:      Musculoskeletal: Strength & Muscle Tone: within normal limits Gait & Station: normal Patient leans: N/A  Mental Status Per Nursing Assessment::   On Admission:   Alcohol intoxication  Current Mental Status by Physician: NA  Loss Factors: NA  Historical Factors: NA  Risk Reduction Factors:   Sense of responsibility to family, Positive social support and Positive therapeutic relationship  Continued Clinical Symptoms:  None  Cognitive Features That Contribute To Risk:  None   Suicide Risk:  Minimal: No identifiable suicidal ideation.  Patients presenting with no risk factors but with morbid ruminations; may be classified as minimal risk based on the severity of the depressive symptoms  Discharge Diagnoses:   AXIS I:  Substance Induced Mood Disorder; PTSD; alcohol dependence with withdrawal uncomplicated AXIS II:  Deferred AXIS III:   Past Medical History  Diagnosis Date  . Depression   . Peptic ulcer   . Alcohol  abuse   . Anemia   . Benzodiazepine abuse   . Thyroid disease   . Alcoholism   . Narcotic abuse   . Back pain   . Thrombocytopenia 06/17/2011  . Hypothyroidism   . Seizures   . Medical history non-contributory     hypoglycemic   AXIS IV:  other psychosocial or environmental problems and problems related to social environment AXIS V:  61-70 mild symptoms  Plan Of Care/Follow-up recommendations:  Activity:  as tolerated Diet:  heart healthy diet  Is patient on multiple antipsychotic therapies at discharge:  No   Has Patient had three or more failed trials of antipsychotic monotherapy by history:  No  Recommended Plan for Multiple Antipsychotic Therapies: NA    LORD, JAMISON, PMh-NP 10/17/2014, 10:57 AM

## 2014-10-25 ENCOUNTER — Encounter (HOSPITAL_COMMUNITY): Payer: Self-pay | Admitting: Emergency Medicine

## 2014-10-25 ENCOUNTER — Emergency Department (HOSPITAL_COMMUNITY)
Admission: EM | Admit: 2014-10-25 | Discharge: 2014-10-26 | Disposition: A | Discharge status: Home / Self Care | Payer: No Typology Code available for payment source | Attending: Emergency Medicine | Admitting: Emergency Medicine

## 2014-10-25 ENCOUNTER — Encounter (HOSPITAL_COMMUNITY): Payer: Self-pay

## 2014-10-25 ENCOUNTER — Emergency Department (HOSPITAL_COMMUNITY)
Admission: EM | Admit: 2014-10-25 | Discharge: 2014-10-25 | Discharge status: Against Medical Advice | Payer: Self-pay | Attending: Emergency Medicine | Admitting: Emergency Medicine

## 2014-10-25 DIAGNOSIS — F1020 Alcohol dependence, uncomplicated: Secondary | ICD-10-CM | POA: Insufficient documentation

## 2014-10-25 DIAGNOSIS — Z9049 Acquired absence of other specified parts of digestive tract: Secondary | ICD-10-CM | POA: Insufficient documentation

## 2014-10-25 DIAGNOSIS — Z862 Personal history of diseases of the blood and blood-forming organs and certain disorders involving the immune mechanism: Secondary | ICD-10-CM | POA: Insufficient documentation

## 2014-10-25 DIAGNOSIS — Z9851 Tubal ligation status: Secondary | ICD-10-CM | POA: Insufficient documentation

## 2014-10-25 DIAGNOSIS — Z79899 Other long term (current) drug therapy: Secondary | ICD-10-CM | POA: Insufficient documentation

## 2014-10-25 DIAGNOSIS — Z720 Tobacco use: Secondary | ICD-10-CM | POA: Insufficient documentation

## 2014-10-25 DIAGNOSIS — F329 Major depressive disorder, single episode, unspecified: Secondary | ICD-10-CM | POA: Insufficient documentation

## 2014-10-25 DIAGNOSIS — Z8711 Personal history of peptic ulcer disease: Secondary | ICD-10-CM | POA: Insufficient documentation

## 2014-10-25 DIAGNOSIS — G40909 Epilepsy, unspecified, not intractable, without status epilepticus: Secondary | ICD-10-CM | POA: Insufficient documentation

## 2014-10-25 DIAGNOSIS — Z9889 Other specified postprocedural states: Secondary | ICD-10-CM | POA: Insufficient documentation

## 2014-10-25 DIAGNOSIS — E039 Hypothyroidism, unspecified: Secondary | ICD-10-CM | POA: Insufficient documentation

## 2014-10-25 DIAGNOSIS — F1310 Sedative, hypnotic or anxiolytic abuse, uncomplicated: Secondary | ICD-10-CM | POA: Insufficient documentation

## 2014-10-25 DIAGNOSIS — Z8739 Personal history of other diseases of the musculoskeletal system and connective tissue: Secondary | ICD-10-CM | POA: Insufficient documentation

## 2014-10-25 DIAGNOSIS — E079 Disorder of thyroid, unspecified: Secondary | ICD-10-CM | POA: Insufficient documentation

## 2014-10-25 DIAGNOSIS — F1010 Alcohol abuse, uncomplicated: Secondary | ICD-10-CM | POA: Insufficient documentation

## 2014-10-25 DIAGNOSIS — K2970 Gastritis, unspecified, without bleeding: Secondary | ICD-10-CM | POA: Insufficient documentation

## 2014-10-25 LAB — COMPREHENSIVE METABOLIC PANEL
ALBUMIN: 3.6 g/dL (ref 3.5–5.2)
ALK PHOS: 110 U/L (ref 39–117)
ALT: 173 U/L — ABNORMAL HIGH (ref 0–35)
ANION GAP: 17 — AB (ref 5–15)
AST: 208 U/L — ABNORMAL HIGH (ref 0–37)
BUN: 6 mg/dL (ref 6–23)
CHLORIDE: 91 meq/L — AB (ref 96–112)
CO2: 21 mmol/L (ref 19–32)
Calcium: 7.9 mg/dL — ABNORMAL LOW (ref 8.4–10.5)
Creatinine, Ser: 0.68 mg/dL (ref 0.50–1.10)
GFR calc Af Amer: >90 mL/min (ref >90)
GFR calc non Af Amer: >90 mL/min (ref >90)
Glucose, Bld: 110 mg/dL — ABNORMAL HIGH (ref 70–99)
Potassium: 3.7 mmol/L (ref 3.5–5.1)
SODIUM: 129 mmol/L — AB (ref 135–145)
Total Bilirubin: 0.4 mg/dL (ref 0.3–1.2)
Total Protein: 6.8 g/dL (ref 6.0–8.3)

## 2014-10-25 LAB — CBC WITH DIFFERENTIAL/PLATELET
BASOS PCT: 0 % (ref 0–1)
Basophils Absolute: 0 10*3/uL (ref 0.0–0.1)
Basophils Absolute: 0 10*3/uL (ref 0.0–0.1)
Basophils Relative: 0 % (ref 0–1)
EOS PCT: 1 % (ref 0–5)
Eosinophils Absolute: 0 10*3/uL (ref 0.0–0.7)
Eosinophils Absolute: 0.1 10*3/uL (ref 0.0–0.7)
Eosinophils Relative: 0 % (ref 0–5)
HCT: 29.2 % — ABNORMAL LOW (ref 36.0–46.0)
HCT: 28.1 % — ABNORMAL LOW (ref 36.0–46.0)
HEMOGLOBIN: 9.1 g/dL — AB (ref 12.0–15.0)
Hemoglobin: 9.3 g/dL — ABNORMAL LOW (ref 12.0–15.0)
LYMPHS ABS: 0.9 10*3/uL (ref 0.7–4.0)
LYMPHS ABS: 0.4 10*3/uL — AB (ref 0.7–4.0)
Lymphocytes Relative: 11 % — ABNORMAL LOW (ref 12–46)
Lymphocytes Relative: 6 % — ABNORMAL LOW (ref 12–46)
MCH: 26.3 pg (ref 26.0–34.0)
MCH: 26.7 pg (ref 26.0–34.0)
MCHC: 31.8 g/dL (ref 30.0–36.0)
MCHC: 32.4 g/dL (ref 30.0–36.0)
MCV: 82.7 fL (ref 78.0–100.0)
MCV: 82.4 fL (ref 78.0–100.0)
Monocytes Absolute: 0.8 10*3/uL (ref 0.1–1.0)
Monocytes Absolute: 1.1 10*3/uL — ABNORMAL HIGH (ref 0.1–1.0)
Monocytes Relative: 10 % (ref 3–12)
Monocytes Relative: 15 % — ABNORMAL HIGH (ref 3–12)
NEUTROS ABS: 5.4 10*3/uL (ref 1.7–7.7)
NEUTROS PCT: 79 % — AB (ref 43–77)
NEUTROS PCT: 78 % — AB (ref 43–77)
Neutro Abs: 6.6 10*3/uL (ref 1.7–7.7)
PLATELETS: 443 10*3/uL — AB (ref 150–400)
Platelets: 429 10*3/uL — ABNORMAL HIGH (ref 150–400)
RBC: 3.53 MIL/uL — ABNORMAL LOW (ref 3.87–5.11)
RBC: 3.41 MIL/uL — AB (ref 3.87–5.11)
RDW: 21.8 % — AB (ref 11.5–15.5)
RDW: 21.8 % — AB (ref 11.5–15.5)
WBC: 8.4 10*3/uL (ref 4.0–10.5)
WBC: 7 10*3/uL (ref 4.0–10.5)

## 2014-10-25 LAB — RAPID URINE DRUG SCREEN, HOSP PERFORMED
AMPHETAMINES: NOT DETECTED
Amphetamines: NOT DETECTED
BARBITURATES: NOT DETECTED
BENZODIAZEPINES: POSITIVE — AB
Barbiturates: NOT DETECTED
Benzodiazepines: POSITIVE — AB
Cocaine: NOT DETECTED
Cocaine: NOT DETECTED
OPIATES: NOT DETECTED
Opiates: NOT DETECTED
Tetrahydrocannabinol: NOT DETECTED
Tetrahydrocannabinol: NOT DETECTED

## 2014-10-25 LAB — BASIC METABOLIC PANEL
ANION GAP: 16 — AB (ref 5–15)
BUN: 5 mg/dL — ABNORMAL LOW (ref 6–23)
CO2: 20 mmol/L (ref 19–32)
Calcium: 8.2 mg/dL — ABNORMAL LOW (ref 8.4–10.5)
Chloride: 92 mEq/L — ABNORMAL LOW (ref 96–112)
Creatinine, Ser: 0.66 mg/dL (ref 0.50–1.10)
GFR calc non Af Amer: >90 mL/min (ref >90)
Glucose, Bld: 135 mg/dL — ABNORMAL HIGH (ref 70–99)
Potassium: 4 mmol/L (ref 3.5–5.1)
Sodium: 128 mmol/L — ABNORMAL LOW (ref 135–145)

## 2014-10-25 LAB — ETHANOL
ALCOHOL ETHYL (B): 142 mg/dL — AB (ref 0–9)
Alcohol, Ethyl (B): 191 mg/dL — ABNORMAL HIGH (ref 0–9)

## 2014-10-25 LAB — CARBAMAZEPINE LEVEL, TOTAL: Carbamazepine Lvl: <2 ug/mL — ABNORMAL LOW (ref 4.0–12.0)

## 2014-10-25 LAB — LIPASE, BLOOD: Lipase: 42 U/L (ref 11–59)

## 2014-10-25 MED ORDER — PANTOPRAZOLE SODIUM 40 MG IV SOLR
40.0000 mg | Freq: Once | INTRAVENOUS | Status: AC
  Administered 2014-10-25: 40 mg via INTRAVENOUS
  Filled 2014-10-25: qty 40

## 2014-10-25 MED ORDER — SODIUM CHLORIDE 0.9 % IV BOLUS (SEPSIS)
1000.0000 mL | Freq: Once | INTRAVENOUS | Status: AC
  Administered 2014-10-25: 1000 mL via INTRAVENOUS

## 2014-10-25 MED ORDER — LORAZEPAM 2 MG/ML IJ SOLN
2.0000 mg | Freq: Once | INTRAMUSCULAR | Status: AC
  Administered 2014-10-25: 2 mg via INTRAMUSCULAR
  Filled 2014-10-25: qty 1

## 2014-10-25 MED ORDER — LORAZEPAM 2 MG/ML IJ SOLN: 2.0000 mg | Freq: Once | INTRAMUSCULAR | Status: DC

## 2014-10-25 MED ORDER — ONDANSETRON HCL 4 MG/2ML IJ SOLN
4.0000 mg | Freq: Once | INTRAMUSCULAR | Status: AC
  Administered 2014-10-25: 4 mg via INTRAVENOUS
  Filled 2014-10-25: qty 2

## 2014-10-25 MED ORDER — LORAZEPAM 2 MG/ML IJ SOLN
1.0000 mg | Freq: Once | INTRAMUSCULAR | Status: AC
  Administered 2014-10-25: 1 mg via INTRAVENOUS
  Filled 2014-10-25: qty 1

## 2014-10-25 NOTE — ED Provider Notes (Signed)
CSN: [id number]     Arrival date & time 10/25/14  1546 History   First MD Initiated Contact with Patient 10/25/14 1652     Chief Complaint  Patient presents with  . V70.1     (Consider location/radiation/quality/duration/timing/severity/associated sxs/prior Treatment) Patient is a 49 y.o. female presenting with abdominal pain. The history is provided by the patient (the pt complains of abd pain.  she has a hx of pud.  she also wants help with alcohol).  Abdominal Pain Pain location:  Epigastric Pain quality: aching   Pain radiates to:  Does not radiate Pain severity:  Moderate Onset quality:  Gradual Timing:  Constant Progression:  Worsening Chronicity:  Recurrent Associated symptoms: vomiting   Associated symptoms: no chest pain, no cough, no diarrhea, no fatigue and no hematuria     Past Medical History  Diagnosis Date  . Depression   . Peptic ulcer   . Alcohol abuse   . Anemia   . Benzodiazepine abuse   . Thyroid disease   . Alcoholism   . Narcotic abuse   . Back pain   . Thrombocytopenia 06/17/2011  . Hypothyroidism   . Seizures   . Medical history non-contributory     hypoglycemic   Past Surgical History  Procedure Laterality Date  . Cholecystectomy    . Abdominal surgery    . Esophagogastroduodenoscopy    . Gastric bypass    . Tubal ligation    . Esophagogastroduodenoscopy N/A 03/23/2013    Procedure: ESOPHAGOGASTRODUODENOSCOPY (EGD);  Surgeon: Ladene Artist, MD;  Location: [facility];  Service: Endoscopy;  Laterality: N/A;  . No past surgeries      gastric bypass   Family History  Problem Relation Age of Onset  . Alcohol abuse Father   . Alcoholism Father   . Cancer Other    History  Substance Use Topics  . Smoking status: Current Every Day Smoker -- 1.00 packs/day for 12 years    Types: Cigarettes    Last Attempt to Quit: 08/08/2011  . Smokeless tobacco: Never Used  . Alcohol Use: Yes     Comment: drink 4 liters a day. 1 case/day drinks  Listerine   OB History    Gravida Para Term Preterm AB TAB SAB Ectopic Multiple Living   3 3  3      3      Review of Systems  Constitutional: Negative for appetite change and fatigue.  HENT: Negative for congestion, ear discharge and sinus pressure.   Eyes: Negative for discharge.  Respiratory: Negative for cough.   Cardiovascular: Negative for chest pain.  Gastrointestinal: Positive for vomiting and abdominal pain. Negative for diarrhea.  Genitourinary: Negative for frequency and hematuria.  Musculoskeletal: Negative for back pain.  Skin: Negative for rash.  Neurological: Negative for seizures and headaches.  Psychiatric/Behavioral: Positive for agitation. Negative for hallucinations.      Allergies  Nsaids  Home Medications   Prior to Admission medications   Medication Sig Start Date End Date Taking? Authorizing Provider  carbamazepine (TEGRETOL XR) 100 MG 12 hr tablet Take 3 tablets (300 mg total) by mouth 2 (two) times daily. Patient not taking: Reported on 10/15/2014 05/28/14   Elmarie Shiley, NP  carbamazepine (TEGRETOL XR) 100 MG 12 hr tablet Take 3 tablets (300 mg total) by mouth 2 (two) times daily. 10/17/14   Waylan Boga, NP  cephALEXin (KEFLEX) 500 MG capsule Take 1 capsule (500 mg total) by mouth every 8 (eight) hours. 10/17/14   Waylan Boga,  NP  FLUoxetine (PROZAC) 20 MG capsule Take 3 capsules (60 mg total) by mouth daily. Patient not taking: Reported on 10/15/2014 05/28/14   Elmarie Shiley, NP  FLUoxetine (PROZAC) 20 MG capsule Take 3 capsules (60 mg total) by mouth daily. 10/17/14   Waylan Boga, NP  levothyroxine (SYNTHROID, LEVOTHROID) 125 MCG tablet Take 1 tablet (125 mcg total) by mouth daily before breakfast. Patient not taking: Reported on 10/15/2014 09/06/14   Evalee Jefferson, PA-C  levothyroxine (SYNTHROID, LEVOTHROID) 125 MCG tablet Take 1 tablet (125 mcg total) by mouth daily before breakfast. For thyroid hormone replacement 10/17/14   Waylan Boga, NP  omeprazole  (PRILOSEC) 40 MG capsule Take 1 capsule (40 mg total) by mouth daily. 10/17/14   Waylan Boga, NP   BP 119/72 mmHg  Pulse 91  Temp(Src) 98.9 F (37.2 C) (Oral)  Resp 18  Ht 5\' 9"  (1.753 m)  Wt 175 lb (79.379 kg)  BMI 25.83 kg/m2  SpO2 100%  LMP 09/24/2014 Physical Exam  Constitutional: She is oriented to person, place, and time. She appears well-developed.  HENT:  Head: Normocephalic.  Eyes: Conjunctivae and EOM are normal. No scleral icterus.  Neck: Neck supple. No thyromegaly present.  Cardiovascular: Normal rate and regular rhythm.  Exam reveals no gallop and no friction rub.   No murmur heard. Pulmonary/Chest: No stridor. She has no wheezes. She has no rales. She exhibits no tenderness.  Abdominal: She exhibits no distension. There is tenderness. There is no rebound.  Musculoskeletal: Normal range of motion. She exhibits no edema.  Lymphadenopathy:    She has no cervical adenopathy.  Neurological: She is oriented to person, place, and time. She exhibits normal muscle tone. Coordination normal.  Skin: No rash noted. No erythema.  Psychiatric:  Pt depresssed not suicidal or homicidal,   Wants help with detox    ED Course  Procedures (including critical care time) Labs Review Labs Reviewed  ETHANOL - Abnormal; Notable for the following:    Alcohol, Ethyl (B) 191 (*)    All other components within normal limits  CBC WITH DIFFERENTIAL - Abnormal; Notable for the following:    RBC 3.53 (*)    Hemoglobin 9.3 (*)    HCT 29.2 (*)    RDW 21.8 (*)    Platelets 443 (*)    Neutrophils Relative % 79 (*)    Lymphocytes Relative 11 (*)    All other components within normal limits  COMPREHENSIVE METABOLIC PANEL - Abnormal; Notable for the following:    Sodium 129 (*)    Chloride 91 (*)    Glucose, Bld 110 (*)    Calcium 7.9 (*)    AST 208 (*)    ALT 173 (*)    Anion gap 17 (*)    All other components within normal limits  URINE RAPID DRUG SCREEN (HOSP PERFORMED) - Abnormal;  Notable for the following:    Benzodiazepines POSITIVE (*)    All other components within normal limits  LIPASE, BLOOD    Imaging Review No results found.   EKG Interpretation None      MDM   Final diagnoses:  None    Pt left ama without telling anyone    Maudry Diego, MD 10/25/14 2000

## 2014-10-25 NOTE — ED Notes (Signed)
Pt wanted to leave AMA, EDP Pickering spoke to pt and told her we would give her some medicine, pt will stay for now.

## 2014-10-25 NOTE — ED Notes (Signed)
Pt was here an hour ago in room 15, had left the department without telling anyone, left with an IV in left anterior wrist.  [employer] was called to locate pt to have her come back to have IV removed, and now pt returns to finish her evaluation. While pt was checking in at the registration desk, the patient grabbed the bottle of hand sanitizer from the desktop and tried to take it into the bathroom with her.  The registration clerk was able to get the bottle away from the patient before she drank it.  Pt then went into the restroom and pulled her IV out of her left wrist and was bleeding on the bathroom floor.  Pt was apparently brought back in by her father who told the patient if she didn't cooperate and finish her evaluation he would go to the court house and take out papers on her.  Pt agreeable with her father.

## 2014-10-25 NOTE — ED Notes (Addendum)
Pt denies SI or HI, states she wants to live that is why she is here, Pt can not sit still, very anxious

## 2014-10-25 NOTE — BH Assessment (Signed)
Received TTS Consult and called to set up TA with nurse.  Was told Pt left ED and Consult cancelled.  Faylene Kurtz, MS, CRC, [place] [facility]

## 2014-10-25 NOTE — ED Notes (Signed)
Pt wanting detox from alcohol, drank at 3:30, today had bottle of wine, 3 bottles of Listerine. Detox last week, went to [place] started drinking when she got home

## 2014-10-25 NOTE — ED Provider Notes (Signed)
CSN: [id number]     Arrival date & time 10/25/14  2015 History  This chart was scribed for [employer]. Alvino Chapel, MD by Molli Posey, ED Scribe. This patient was seen in room APA17/APA17 and the patient's care was started 10:07 PM.     Chief Complaint  Patient presents with  . Alcohol Intoxication   LEVEL 5 CAVEAT - ALCOHOL INTOXICATION   HPI HPI Comments: Jacqueline [name] is a 49 y.o. female who presents to the Emergency Department complaining of alcohol intoxication. Pt was here an hour ago in room 15, had left the department without telling anyone, left with an IV in left anterior wrist. [facility] was called to locate pt to have her come back to have IV removed, and now pt returns to finish her evaluation. Pt states she drinks "everything" including Listerine and said she drank a big bottle of wine earlier today. She reports she uses no other drugs. Pt states she has gone through alcohol treatment before this past summer. Pt complains of abdominal pain at this time. She denies SI and HI.   Past Medical History  Diagnosis Date  . Depression   . Peptic ulcer   . Alcohol abuse   . Anemia   . Benzodiazepine abuse   . Thyroid disease   . Alcoholism   . Narcotic abuse   . Back pain   . Thrombocytopenia 06/17/2011  . Hypothyroidism   . Seizures   . Medical history non-contributory     hypoglycemic   Past Surgical History  Procedure Laterality Date  . Cholecystectomy    . Abdominal surgery    . Esophagogastroduodenoscopy    . Gastric bypass    . Tubal ligation    . Esophagogastroduodenoscopy N/A 03/23/2013    Procedure: ESOPHAGOGASTRODUODENOSCOPY (EGD);  Surgeon: Ladene Artist, MD;  Location: [facility];  Service: Endoscopy;  Laterality: N/A;  . No past surgeries      gastric bypass   Family History  Problem Relation Age of Onset  . Alcohol abuse Father   . Alcoholism Father   . Cancer Other    History  Substance Use Topics  . Smoking status: Current Every Day Smoker --  1.00 packs/day for 12 years    Types: Cigarettes    Last Attempt to Quit: 08/08/2011  . Smokeless tobacco: Never Used  . Alcohol Use: Yes     Comment: drink 4 liters a day. 1 case/day drinks Listerine   OB History    Gravida Para Term Preterm AB TAB SAB Ectopic Multiple Living   3 3  3      3      Review of Systems  Unable to perform ROS: Other    Allergies  Nsaids  Home Medications   Prior to Admission medications   Medication Sig Start Date End Date Taking? Authorizing Provider  cephALEXin (KEFLEX) 500 MG capsule Take 1 capsule (500 mg total) by mouth every 8 (eight) hours. 10/17/14  Yes Waylan Boga, NP  FLUoxetine (PROZAC) 20 MG capsule Take 3 capsules (60 mg total) by mouth daily. 10/17/14  Yes Waylan Boga, NP  levothyroxine (SYNTHROID, LEVOTHROID) 125 MCG tablet Take 1 tablet (125 mcg total) by mouth daily before breakfast. For thyroid hormone replacement 10/17/14  Yes Waylan Boga, NP  omeprazole (PRILOSEC) 40 MG capsule Take 1 capsule (40 mg total) by mouth daily. 10/17/14  Yes Waylan Boga, NP  carbamazepine (TEGRETOL XR) 100 MG 12 hr tablet Take 3 tablets (300 mg total) by mouth 2 (  two) times daily. 10/17/14   Waylan Boga, NP  FLUoxetine (PROZAC) 20 MG capsule Take 3 capsules (60 mg total) by mouth daily. Patient not taking: Reported on 10/15/2014 05/28/14   Elmarie Shiley, NP   BP 139/75 mmHg  Pulse 104  Temp(Src) 99.1 F (37.3 C) (Oral)  Resp 20  Ht 5\' 9"  (1.753 m)  Wt 175 lb (79.379 kg)  BMI 25.83 kg/m2  SpO2 96%  LMP 09/24/2014 Physical Exam  Constitutional: She appears well-developed and well-nourished.  Depressed. Tremulous.   HENT:  Head: Normocephalic and atraumatic.  Eyes: Right eye exhibits no discharge. Left eye exhibits no discharge.  Pupils large   Neck: No tracheal deviation present.  Cardiovascular: Normal rate.   Pulmonary/Chest: Effort normal.  Abdominal: She exhibits no distension. There is tenderness.  RUQ tenderness  Neurological: She is alert.   Skin: Skin is warm and dry.  Psychiatric: Her behavior is normal.  Nursing note and vitals reviewed.   ED Course  Procedures   DIAGNOSTIC STUDIES: Oxygen Saturation is 96% on RA, normal by my interpretation.    COORDINATION OF CARE: 10:11 PM Discussed treatment plan with pt at bedside and pt agreed to plan.   Labs Review Labs Reviewed  CBC WITH DIFFERENTIAL - Abnormal; Notable for the following:    RBC 3.41 (*)    Hemoglobin 9.1 (*)    HCT 28.1 (*)    RDW 21.8 (*)    Platelets 429 (*)    Neutrophils Relative % 78 (*)    Lymphocytes Relative 6 (*)    Monocytes Relative 15 (*)    Lymphs Abs 0.4 (*)    Monocytes Absolute 1.1 (*)    All other components within normal limits  BASIC METABOLIC PANEL - Abnormal; Notable for the following:    Sodium 128 (*)    Chloride 92 (*)    Glucose, Bld 135 (*)    BUN 5 (*)    Calcium 8.2 (*)    Anion gap 16 (*)    All other components within normal limits  ETHANOL - Abnormal; Notable for the following:    Alcohol, Ethyl (B) 142 (*)    All other components within normal limits  URINE RAPID DRUG SCREEN (HOSP PERFORMED) - Abnormal; Notable for the following:    Benzodiazepines POSITIVE (*)    All other components within normal limits  CARBAMAZEPINE LEVEL, TOTAL - Abnormal; Notable for the following:    Carbamazepine Lvl <2.0 (*)    All other components within normal limits    Imaging Review No results found.   EKG Interpretation None      MDM   Final diagnoses:  Alcoholism   patient presents for alcohol abuse and treatment. Recently seen for same and left with her IV in earlier today. She states she wants treatment at this time. She is somewhat tremulous. Will start on CIWA and be seen by [facility].,  I personally performed the services described in this documentation, which was scribed in my presence. The recorded information has been reviewed and is accurate.      Jasper Riling. Alvino Chapel, MD 10/26/14 0000

## 2014-10-26 DIAGNOSIS — F1994 Other psychoactive substance use, unspecified with psychoactive substance-induced mood disorder: Secondary | ICD-10-CM

## 2014-10-26 DIAGNOSIS — F1020 Alcohol dependence, uncomplicated: Secondary | ICD-10-CM | POA: Insufficient documentation

## 2014-10-26 DIAGNOSIS — F1010 Alcohol abuse, uncomplicated: Secondary | ICD-10-CM

## 2014-10-26 MED ORDER — PANTOPRAZOLE SODIUM 40 MG PO TBEC
40.0000 mg | DELAYED_RELEASE_TABLET | Freq: Every day | ORAL | Status: DC
Start: 2014-10-26 — End: 2014-10-26
  Administered 2014-10-26: 40 mg via ORAL

## 2014-10-26 MED ORDER — CARBAMAZEPINE ER 100 MG PO TB12
300.0000 mg | ORAL_TABLET | Freq: Two times a day (BID) | ORAL | Status: DC
  Administered 2014-10-26 (×2): 300 mg via ORAL
  Filled 2014-10-26 (×6): qty 3

## 2014-10-26 MED ORDER — CARBAMAZEPINE ER 100 MG PO TB12
ORAL_TABLET | ORAL | Status: AC
  Filled 2014-10-26: qty 3

## 2014-10-26 MED ORDER — FLUOXETINE HCL 20 MG PO CAPS
60.0000 mg | ORAL_CAPSULE | Freq: Every day | ORAL | Status: DC
  Administered 2014-10-26: 60 mg via ORAL
  Filled 2014-10-26: qty 3

## 2014-10-26 MED ORDER — LORAZEPAM 1 MG PO TABS
0.0000 mg | ORAL_TABLET | Freq: Four times a day (QID) | ORAL | Status: DC
  Filled 2014-10-26 (×2): qty 1

## 2014-10-26 MED ORDER — LORAZEPAM 1 MG PO TABS
0.0000 mg | ORAL_TABLET | Freq: Two times a day (BID) | ORAL | Status: DC
  Administered 2014-10-26 (×2): 1 mg via ORAL

## 2014-10-26 MED ORDER — ALUM & MAG HYDROXIDE-SIMETH 200-200-20 MG/5ML PO SUSP: 30.0000 mL | ORAL | Status: DC | PRN

## 2014-10-26 MED ORDER — LEVOTHYROXINE SODIUM 50 MCG PO TABS
125.0000 ug | ORAL_TABLET | Freq: Every day | ORAL | Status: DC
  Administered 2014-10-26: 125 ug via ORAL
  Filled 2014-10-26: qty 3

## 2014-10-26 MED ORDER — LORAZEPAM 1 MG PO TABS
ORAL_TABLET | ORAL | Status: AC
  Filled 2014-10-26: qty 1

## 2014-10-26 MED ORDER — ONDANSETRON HCL 4 MG PO TABS: 4.0000 mg | ORAL_TABLET | Freq: Three times a day (TID) | ORAL | Status: DC | PRN

## 2014-10-26 MED ORDER — PANTOPRAZOLE SODIUM 40 MG PO TBEC
DELAYED_RELEASE_TABLET | ORAL | Status: AC
  Administered 2014-10-26: 40 mg via ORAL
  Filled 2014-10-26: qty 1
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
## 2014-10-26 NOTE — Progress Notes (Signed)
Pt denying SI, referral faxed to [facility] and [facility].   Will continue to seek placement for detox and substance abuse treatment.  Peri Maris, [place] 10/26/2014 9:27 AM

## 2014-10-26 NOTE — Discharge Instructions (Signed)
Finding Treatment for Alcohol and Drug Addiction It can be hard to find the right place to get professional treatment. Here are some important things to consider:  There are different types of treatment to choose from.  Some programs are live-in (residential) while others are not (outpatient). Sometimes a combination is offered.  No single type of program is right for everyone.  Most treatment programs involve a combination of education, counseling, and a 12-step, spiritually-based approach.  There are non-spiritually based programs (not 12-step).  Some treatment programs are government sponsored. They are geared for patients without private insurance.  Treatment programs can vary in many respects such as:  Cost and types of insurance accepted.  Types of on-site medical services offered.  Length of stay, setting, and size.  Overall philosophy of treatment. A person may need specialized treatment or have needs not addressed by all programs. For example, adolescents need treatment appropriate for their age. Other people have secondary disorders that must be managed as well. Secondary conditions can include mental illness, such as depression or diabetes. Often, a period of detoxification from alcohol or drugs is needed. This requires medical supervision and not all programs offer this. THINGS TO CONSIDER WHEN SELECTING A TREATMENT PROGRAM   Is the program certified by the appropriate government agency? Even private programs must be certified and employ certified professionals.  Does the program accept your insurance? If not, can a payment plan be set up?  Is the facility clean, organized, and well run? Do they allow you to speak with graduates who can share their treatment experience with you? Can you tour the facility? Can you meet with staff?  Does the program meet the full range of individual needs?  Does the treatment program address sexual orientation and physical disabilities?  Do they provide age, gender, and culturally appropriate treatment services?  Is treatment available in languages other than English?  Is long-term aftercare support or guidance encouraged and provided?  Is assessment of an individual's treatment plan ongoing to ensure it meets changing needs?  Does the program use strategies to encourage reluctant patients to remain in treatment long enough to increase the likelihood of success?  Does the program offer counseling (individual or group) and other behavioral therapies?  Does the program offer medicine as part of the treatment regimen, if needed?  Is there ongoing monitoring of possible relapse? Is there a defined relapse prevention program? Are services or referrals offered to family members to ensure they understand addiction and the recovery process? This would help them support the recovering individual.  Are 12-step meetings held at the center or is transport available for patients to attend outside meetings? In countries outside of the [place] and [place], see local directories for contact information for services in your area. Document Released: 08/23/2005 Document Revised: 12/17/2011 Document Reviewed: 03/04/2008 ExitCare Patient Information 2015 [employer]. This information is not intended to replace advice given to you by your health care provider. Make sure you discuss any questions you have with your health care provider.  

## 2014-10-26 NOTE — Care Management Note (Signed)
ED/CM noted patient did not have health insurance and/or PCP listed in the computer.  Patient was given the [place] resource handout with information on the clinics, food pantries, and the handout for new health insurance sign-up. Pt was also given a Rx discount card. Patient expressed appreciation for information received. 

## 2014-10-26 NOTE — ED Notes (Signed)
Pt states she started her period. Request pad and mesh panties. Also, requesting ativan. Pt made aware it is not time for her ativan

## 2014-10-26 NOTE — ED Notes (Signed)
Pt CIWA score was 7 given, Ativan 1 mg po.
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
## 2014-10-26 NOTE — ED Notes (Signed)
Pt CIWA score is 9, Ativan 1 mg po.

## 2014-10-29 ENCOUNTER — Encounter (HOSPITAL_COMMUNITY): Payer: Self-pay | Admitting: Emergency Medicine

## 2014-10-29 ENCOUNTER — Emergency Department (HOSPITAL_COMMUNITY)
Admission: EM | Admit: 2014-10-29 | Discharge: 2014-10-29 | Disposition: A | Discharge status: Home / Self Care | Payer: Self-pay | Attending: Emergency Medicine | Admitting: Emergency Medicine

## 2014-10-29 DIAGNOSIS — Z8711 Personal history of peptic ulcer disease: Secondary | ICD-10-CM | POA: Insufficient documentation

## 2014-10-29 DIAGNOSIS — Z720 Tobacco use: Secondary | ICD-10-CM | POA: Insufficient documentation

## 2014-10-29 DIAGNOSIS — Z862 Personal history of diseases of the blood and blood-forming organs and certain disorders involving the immune mechanism: Secondary | ICD-10-CM | POA: Insufficient documentation

## 2014-10-29 DIAGNOSIS — F329 Major depressive disorder, single episode, unspecified: Secondary | ICD-10-CM | POA: Insufficient documentation

## 2014-10-29 DIAGNOSIS — E079 Disorder of thyroid, unspecified: Secondary | ICD-10-CM | POA: Insufficient documentation

## 2014-10-29 DIAGNOSIS — E039 Hypothyroidism, unspecified: Secondary | ICD-10-CM | POA: Insufficient documentation

## 2014-10-29 DIAGNOSIS — N390 Urinary tract infection, site not specified: Secondary | ICD-10-CM | POA: Insufficient documentation

## 2014-10-29 DIAGNOSIS — Z79899 Other long term (current) drug therapy: Secondary | ICD-10-CM | POA: Insufficient documentation

## 2014-10-29 LAB — URINALYSIS, ROUTINE W REFLEX MICROSCOPIC
Glucose, UA: 250 mg/dL — AB
Hgb urine dipstick: NEGATIVE
Ketones, ur: 15 mg/dL — AB
NITRITE: POSITIVE — AB
PH: 7 (ref 5.0–8.0)
Protein, ur: >300 mg/dL — AB
SPECIFIC GRAVITY, URINE: 1.01 (ref 1.005–1.030)

## 2014-10-29 LAB — URINE MICROSCOPIC-ADD ON

## 2014-10-29 MED ORDER — CIPROFLOXACIN HCL 500 MG PO TABS: 500.0000 mg | ORAL_TABLET | Freq: Two times a day (BID) | ORAL | Status: DC

## 2014-10-29 MED ORDER — CEFTRIAXONE SODIUM 1 G IJ SOLR
1.0000 g | Freq: Once | INTRAMUSCULAR | Status: AC
  Administered 2014-10-29: 1 g via INTRAMUSCULAR
  Filled 2014-10-29: qty 10

## 2014-10-29 MED ORDER — LIDOCAINE HCL (PF) 1 % IJ SOLN
INTRAMUSCULAR | Status: AC
  Administered 2014-10-29: 2.1 mL
  Filled 2014-10-29: qty 5

## 2014-10-29 MED ORDER — ONDANSETRON HCL 4 MG PO TABS
4.0000 mg | ORAL_TABLET | Freq: Once | ORAL | Status: AC
  Administered 2014-10-29: 4 mg via ORAL
  Filled 2014-10-29: qty 1

## 2014-10-29 MED ORDER — PHENAZOPYRIDINE HCL 100 MG PO TABS
100.0000 mg | ORAL_TABLET | Freq: Once | ORAL | Status: AC
  Administered 2014-10-29: 100 mg via ORAL
  Filled 2014-10-29: qty 1

## 2014-10-29 NOTE — ED Provider Notes (Signed)
CSN: [id number]     Arrival date & time 10/29/14  6546 History   First MD Initiated Contact with Patient 10/29/14 [phone]     Chief Complaint  Patient presents with  . Urinary Tract Infection     (Consider location/radiation/quality/duration/timing/severity/associated sxs/prior Treatment) Patient is a 49 y.o. female presenting with urinary tract infection. The history is provided by the patient.  Urinary Tract Infection This is a recurrent problem. The current episode started in the past 7 days. The problem has been gradually worsening. Pertinent negatives include no abdominal pain, anorexia, arthralgias, chest pain, coughing, fever, neck pain or vomiting. Associated symptoms comments: dysuria. Nothing aggravates the symptoms. Treatments tried: urine analgesics. The treatment provided no relief.    Past Medical History  Diagnosis Date  . Depression   . Peptic ulcer   . Alcohol abuse   . Anemia   . Benzodiazepine abuse   . Thyroid disease   . Alcoholism   . Narcotic abuse   . Back pain   . Thrombocytopenia 06/17/2011  . Hypothyroidism   . Seizures   . Medical history non-contributory     hypoglycemic   Past Surgical History  Procedure Laterality Date  . Cholecystectomy    . Abdominal surgery    . Esophagogastroduodenoscopy    . Gastric bypass    . Tubal ligation    . Esophagogastroduodenoscopy N/A 03/23/2013    Procedure: ESOPHAGOGASTRODUODENOSCOPY (EGD);  Surgeon: Ladene Artist, MD;  Location: [facility];  Service: Endoscopy;  Laterality: N/A;  . No past surgeries      gastric bypass   Family History  Problem Relation Age of Onset  . Alcohol abuse Father   . Alcoholism Father   . Cancer Other    History  Substance Use Topics  . Smoking status: Current Every Day Smoker -- 1.00 packs/day for 12 years    Types: Cigarettes    Last Attempt to Quit: 08/08/2011  . Smokeless tobacco: Never Used  . Alcohol Use: Yes     Comment: drink 4 liters a day. 1 case/day drinks  Listerine   OB History    Gravida Para Term Preterm AB TAB SAB Ectopic Multiple Living   3 3  3      3      Review of Systems  Constitutional: Negative for fever and activity change.       All ROS Neg except as noted in HPI  Eyes: Negative for photophobia and discharge.  Respiratory: Negative for cough, shortness of breath and wheezing.   Cardiovascular: Negative for chest pain and palpitations.  Gastrointestinal: Negative for vomiting, abdominal pain, blood in stool and anorexia.  Genitourinary: Positive for dysuria and frequency. Negative for hematuria.  Musculoskeletal: Negative for back pain, arthralgias and neck pain.  Skin: Negative.   Neurological: Negative for dizziness, seizures and speech difficulty.  Psychiatric/Behavioral: Negative for hallucinations and confusion. The patient is nervous/anxious.       Allergies  Nsaids  Home Medications   Prior to Admission medications   Medication Sig Start Date End Date Taking? Authorizing Provider  carbamazepine (TEGRETOL XR) 100 MG 12 hr tablet Take 3 tablets (300 mg total) by mouth 2 (two) times daily. 10/17/14  Yes Waylan Boga, NP  FLUoxetine (PROZAC) 20 MG capsule Take 3 capsules (60 mg total) by mouth daily. 10/17/14  Yes Waylan Boga, NP  levothyroxine (SYNTHROID, LEVOTHROID) 125 MCG tablet Take 1 tablet (125 mcg total) by mouth daily before breakfast. For thyroid hormone replacement 10/17/14  Yes Theodoro Clock  Lord, NP  omeprazole (PRILOSEC) 40 MG capsule Take 1 capsule (40 mg total) by mouth daily. 10/17/14  Yes Waylan Boga, NP  cephALEXin (KEFLEX) 500 MG capsule Take 1 capsule (500 mg total) by mouth every 8 (eight) hours. Patient not taking: Reported on 10/29/2014 10/17/14   Waylan Boga, NP  ciprofloxacin (CIPRO) 500 MG tablet Take 1 tablet (500 mg total) by mouth 2 (two) times daily. 10/29/14   Lenox Ahr, PA-C  FLUoxetine (PROZAC) 20 MG capsule Take 3 capsules (60 mg total) by mouth daily. Patient not taking: Reported on  10/15/2014 05/28/14   Elmarie Shiley, NP   BP 123/78 mmHg  Pulse 78  Temp(Src) 99 F (37.2 C) (Oral)  Resp 20  Ht 5\' 9"  (1.753 m)  Wt 175 lb (79.379 kg)  BMI 25.83 kg/m2  SpO2 98%  LMP 09/20/2014 Physical Exam  Constitutional: She is oriented to person, place, and time. She appears well-developed and well-nourished.  Non-toxic appearance.  HENT:  Head: Normocephalic.  Right Ear: Tympanic membrane and external ear normal.  Left Ear: Tympanic membrane and external ear normal.  Eyes: EOM and lids are normal. Pupils are equal, round, and reactive to light.  Neck: Normal range of motion. Neck supple. Carotid bruit is not present.  Cardiovascular: Normal rate, regular rhythm, normal heart sounds, intact distal pulses and normal pulses.   Pulmonary/Chest: Breath sounds normal. No respiratory distress.  Abdominal: Soft. Bowel sounds are normal. There is tenderness in the suprapubic area. There is CVA tenderness. There is no guarding.  Musculoskeletal: Normal range of motion.  Lymphadenopathy:       Head (right side): No submandibular adenopathy present.       Head (left side): No submandibular adenopathy present.    She has no cervical adenopathy.  Neurological: She is alert and oriented to person, place, and time. She has normal strength. No cranial nerve deficit or sensory deficit.  Skin: Skin is warm and dry.  Psychiatric: She has a normal mood and affect. Her speech is normal.  Nursing note and vitals reviewed.   ED Course  Procedures (including critical care time) Labs Review Labs Reviewed  URINALYSIS, ROUTINE W REFLEX MICROSCOPIC - Abnormal; Notable for the following:    Color, Urine ORANGE (*)    APPearance HAZY (*)    Glucose, UA 250 (*)    Bilirubin Urine SMALL (*)    Ketones, ur 15 (*)    Protein, ur >300 (*)    Urobilinogen, UA >8.0 (*)    Nitrite POSITIVE (*)    Leukocytes, UA MODERATE (*)    All other components within normal limits  URINE MICROSCOPIC-ADD ON -  Abnormal; Notable for the following:    Squamous Epithelial / LPF FEW (*)    Bacteria, UA MANY (*)    All other components within normal limits  URINE CULTURE    Imaging Review No results found.   EKG Interpretation None      MDM  UA suggest UTI. Temp 99 without tachycardia. Pt does not appear toxic. Doubt pyelonephritis.  Review of previous records reveal recent diagnosis of UTI treated with keflex. Pt states she has been treated twice in 60 days with keflex, but the infection returns. Previous culture report reveals sensitivity to cipro. Will try cipro and suggest urology consult for recurrent UTIs.   Final diagnoses:  UTI (lower urinary tract infection)    *I have reviewed nursing notes, vital signs, and all appropriate lab and imaging results for this patient.**  Lenox Ahr, PA-C 10/29/14 0945  Maudry Diego, MD 10/29/14 [phone]

## 2014-10-29 NOTE — Discharge Instructions (Signed)
Please increase fluids. Use cipro two times daily with food. Please see [facility] for evaluation of your recurrent/resistant urinary tract infections. Urinary Tract Infection A urinary tract infection (UTI) can occur any place along the urinary tract. The tract includes the kidneys, ureters, bladder, and urethra. A type of germ called bacteria often causes a UTI. UTIs are often helped with antibiotic medicine.  HOME CARE   If given, take antibiotics as told by your doctor. Finish them even if you start to feel better.  Drink enough fluids to keep your pee (urine) clear or pale yellow.  Avoid tea, drinks with caffeine, and bubbly (carbonated) drinks.  Pee often. Avoid holding your pee in for a long time.  Pee before and after having sex (intercourse).  Wipe from front to back after you poop (bowel movement) if you are a woman. Use each tissue only once. GET HELP RIGHT AWAY IF:   You have back pain.  You have lower belly (abdominal) pain.  You have chills.  You feel sick to your stomach (nauseous).  You throw up (vomit).  Your burning or discomfort with peeing does not go away.  You have a fever.  Your symptoms are not better in 3 days. MAKE SURE YOU:   Understand these instructions.  Will watch your condition.  Will get help right away if you are not doing well or get worse. Document Released: 03/12/2008 Document Revised: 06/18/2012 Document Reviewed: 04/24/2012 [facility] Patient Information 2015 [place], [place]. This information is not intended to replace advice given to you by your health care provider. Make sure you discuss any questions you have with your health care provider.

## 2014-10-29 NOTE — ED Notes (Signed)
Pt reports onset of burning with urination and urinary frequency since Wed.

## 2014-10-31 LAB — URINE CULTURE
Colony Count: NO GROWTH
Culture: NO GROWTH
SPECIAL REQUESTS: NORMAL

## 2015-01-28 ENCOUNTER — Encounter (HOSPITAL_COMMUNITY): Payer: Self-pay | Admitting: Emergency Medicine

## 2015-01-28 ENCOUNTER — Emergency Department (HOSPITAL_COMMUNITY)
Admission: EM | Admit: 2015-01-28 | Discharge: 2015-01-28 | Disposition: A | Discharge status: Home / Self Care | Payer: Self-pay | Attending: Emergency Medicine | Admitting: Emergency Medicine

## 2015-01-28 DIAGNOSIS — Z79899 Other long term (current) drug therapy: Secondary | ICD-10-CM | POA: Insufficient documentation

## 2015-01-28 DIAGNOSIS — R000 Tachycardia, unspecified: Secondary | ICD-10-CM | POA: Insufficient documentation

## 2015-01-28 DIAGNOSIS — F419 Anxiety disorder, unspecified: Secondary | ICD-10-CM | POA: Insufficient documentation

## 2015-01-28 DIAGNOSIS — R7989 Other specified abnormal findings of blood chemistry: Secondary | ICD-10-CM

## 2015-01-28 DIAGNOSIS — G40909 Epilepsy, unspecified, not intractable, without status epilepticus: Secondary | ICD-10-CM | POA: Insufficient documentation

## 2015-01-28 DIAGNOSIS — Z862 Personal history of diseases of the blood and blood-forming organs and certain disorders involving the immune mechanism: Secondary | ICD-10-CM | POA: Insufficient documentation

## 2015-01-28 DIAGNOSIS — E039 Hypothyroidism, unspecified: Secondary | ICD-10-CM | POA: Insufficient documentation

## 2015-01-28 DIAGNOSIS — Z720 Tobacco use: Secondary | ICD-10-CM | POA: Insufficient documentation

## 2015-01-28 DIAGNOSIS — Z8719 Personal history of other diseases of the digestive system: Secondary | ICD-10-CM | POA: Insufficient documentation

## 2015-01-28 DIAGNOSIS — F329 Major depressive disorder, single episode, unspecified: Secondary | ICD-10-CM | POA: Insufficient documentation

## 2015-01-28 DIAGNOSIS — R945 Abnormal results of liver function studies: Secondary | ICD-10-CM | POA: Insufficient documentation

## 2015-01-28 DIAGNOSIS — F1020 Alcohol dependence, uncomplicated: Secondary | ICD-10-CM | POA: Insufficient documentation

## 2015-01-28 LAB — CBC WITH DIFFERENTIAL/PLATELET
BASOS PCT: 0 % (ref 0–1)
Basophils Absolute: 0 10*3/uL (ref 0.0–0.1)
EOS PCT: 0 % (ref 0–5)
Eosinophils Absolute: 0 10*3/uL (ref 0.0–0.7)
HEMATOCRIT: 36.6 % (ref 36.0–46.0)
Hemoglobin: 12.5 g/dL (ref 12.0–15.0)
LYMPHS ABS: 1 10*3/uL (ref 0.7–4.0)
LYMPHS PCT: 9 % — AB (ref 12–46)
MCH: 31.2 pg (ref 26.0–34.0)
MCHC: 34.2 g/dL (ref 30.0–36.0)
MCV: 91.3 fL (ref 78.0–100.0)
MONO ABS: 1 10*3/uL (ref 0.1–1.0)
Monocytes Relative: 9 % (ref 3–12)
NEUTROS ABS: 9.4 10*3/uL — AB (ref 1.7–7.7)
Neutrophils Relative %: 82 % — ABNORMAL HIGH (ref 43–77)
Platelets: 159 10*3/uL (ref 150–400)
RBC: 4.01 MIL/uL (ref 3.87–5.11)
RDW: 19.2 % — ABNORMAL HIGH (ref 11.5–15.5)
WBC: 11.5 10*3/uL — AB (ref 4.0–10.5)

## 2015-01-28 LAB — URINALYSIS, ROUTINE W REFLEX MICROSCOPIC
BILIRUBIN URINE: NEGATIVE
GLUCOSE, UA: NEGATIVE mg/dL
KETONES UR: NEGATIVE mg/dL
Leukocytes, UA: NEGATIVE
Nitrite: NEGATIVE
Protein, ur: NEGATIVE mg/dL
Specific Gravity, Urine: 1.01 (ref 1.005–1.030)
Urobilinogen, UA: 0.2 mg/dL (ref 0.0–1.0)
pH: 5 (ref 5.0–8.0)

## 2015-01-28 LAB — COMPREHENSIVE METABOLIC PANEL
ALBUMIN: 3.6 g/dL (ref 3.5–5.2)
ALT: 211 U/L — ABNORMAL HIGH (ref 0–35)
AST: 293 U/L — ABNORMAL HIGH (ref 0–37)
Alkaline Phosphatase: 122 U/L — ABNORMAL HIGH (ref 39–117)
Anion gap: 15 (ref 5–15)
BILIRUBIN TOTAL: 0.7 mg/dL (ref 0.3–1.2)
BUN: 10 mg/dL (ref 6–23)
CO2: 23 mmol/L (ref 19–32)
CREATININE: 0.79 mg/dL (ref 0.50–1.10)
Calcium: 8.2 mg/dL — ABNORMAL LOW (ref 8.4–10.5)
Chloride: 93 mmol/L — ABNORMAL LOW (ref 96–112)
GFR calc Af Amer: >90 mL/min (ref >90)
GFR calc non Af Amer: >90 mL/min (ref >90)
Glucose, Bld: 164 mg/dL — ABNORMAL HIGH (ref 70–99)
Potassium: 3.2 mmol/L — ABNORMAL LOW (ref 3.5–5.1)
Sodium: 131 mmol/L — ABNORMAL LOW (ref 135–145)
Total Protein: 6.6 g/dL (ref 6.0–8.3)

## 2015-01-28 LAB — RAPID URINE DRUG SCREEN, HOSP PERFORMED
Amphetamines: NOT DETECTED
Barbiturates: NOT DETECTED
Benzodiazepines: POSITIVE — AB
COCAINE: NOT DETECTED
OPIATES: NOT DETECTED
TETRAHYDROCANNABINOL: NOT DETECTED

## 2015-01-28 LAB — URINE MICROSCOPIC-ADD ON

## 2015-01-28 LAB — ETHANOL: Alcohol, Ethyl (B): 211 mg/dL — ABNORMAL HIGH (ref 0–9)

## 2015-01-28 LAB — LIPASE, BLOOD: LIPASE: 79 U/L — AB (ref 11–59)

## 2015-01-28 LAB — ACETAMINOPHEN LEVEL

## 2015-01-28 LAB — SALICYLATE LEVEL

## 2015-01-28 MED ORDER — LORAZEPAM 1 MG PO TABS
0.0000 mg | ORAL_TABLET | Freq: Two times a day (BID) | ORAL | Status: DC
  Administered 2015-01-28: 1 mg via ORAL
  Filled 2015-01-28: qty 1

## 2015-01-28 MED ORDER — POTASSIUM CHLORIDE CRYS ER 20 MEQ PO TBCR
40.0000 meq | EXTENDED_RELEASE_TABLET | Freq: Once | ORAL | Status: AC
  Administered 2015-01-28: 40 meq via ORAL
  Filled 2015-01-28: qty 2

## 2015-01-28 MED ORDER — CHLORDIAZEPOXIDE HCL 25 MG PO CAPS: ORAL_CAPSULE | ORAL | Status: DC

## 2015-01-28 MED ORDER — LORAZEPAM 1 MG PO TABS: 0.0000 mg | ORAL_TABLET | Freq: Four times a day (QID) | ORAL | Status: DC

## 2015-01-28 MED ORDER — LORAZEPAM 2 MG/ML IJ SOLN: 0.0000 mg | Freq: Two times a day (BID) | INTRAMUSCULAR | Status: DC

## 2015-01-28 MED ORDER — VITAMIN B-1 100 MG PO TABS: 100.0000 mg | ORAL_TABLET | Freq: Every day | ORAL | Status: DC

## 2015-01-28 MED ORDER — NICOTINE 14 MG/24HR TD PT24
14.0000 mg | MEDICATED_PATCH | Freq: Once | TRANSDERMAL | Status: DC
  Administered 2015-01-28: 14 mg via TRANSDERMAL
  Filled 2015-01-28: qty 1

## 2015-01-28 MED ORDER — LORAZEPAM 2 MG/ML IJ SOLN: 0.0000 mg | Freq: Four times a day (QID) | INTRAMUSCULAR | Status: DC

## 2015-01-28 MED ORDER — SODIUM CHLORIDE 0.9 % IV BOLUS (SEPSIS)
1000.0000 mL | Freq: Once | INTRAVENOUS | Status: AC
  Administered 2015-01-28: 1000 mL via INTRAVENOUS

## 2015-01-28 MED ORDER — THIAMINE HCL 100 MG/ML IJ SOLN: 100.0000 mg | Freq: Every day | INTRAMUSCULAR | Status: DC

## 2015-01-28 NOTE — ED Notes (Signed)
Pt reports flank pain that began yesterday and desire to detox. Pt's drank immediately prior to coming to Triage.

## 2015-01-28 NOTE — ED Notes (Addendum)
Patient placed in paper scrubs. Left glasses and watch with her.  Locked her purse, shoes, shirt, pants, underwear and cell phone in locker. Patient wanded by security.

## 2015-01-28 NOTE — ED Notes (Signed)
Patient stating she wants to s/o AMA. Dr. Reather Converse notified. States he will speak with her. Patient notified.

## 2015-01-28 NOTE — ED Notes (Signed)
Patient with no complaints at this time. Respirations even and unlabored. Skin warm/dry. Discharge instructions reviewed with patient at this time. Patient given opportunity to voice concerns/ask questions. Patient discharged at this time and left Emergency Department with steady gait.   

## 2015-01-28 NOTE — ED Notes (Signed)
Patient up to Nurse's station stating she wanted to leave AMA. MD notified and will be in to speak with patient shortly.

## 2015-01-28 NOTE — Discharge Instructions (Signed)
°Emergency Department Resource Guide °1) Find a Doctor and Pay Out of Pocket °Although you won't have to find out who is covered by your insurance plan, it is a good idea to ask around and get recommendations. You will then need to call the office and see if the doctor you have chosen will accept you as a new patient and what types of options they offer for patients who are self-pay. Some doctors offer discounts or will set up payment plans for their patients who do not have insurance, but you will need to ask so you aren't surprised when you get to your appointment. ° °2) Contact Your Local Health Department °Not all health departments have doctors that can see patients for sick visits, but many do, so it is worth a call to see if yours does. If you don't know where your local health department is, you can check in your phone book. The CDC also has a tool to help you locate your state's health department, and many state websites also have listings of all of their local health departments. ° °3) Find a Walk-in Clinic °If your illness is not likely to be very severe or complicated, you may want to try a walk in clinic. These are popping up all over the country in pharmacies, drugstores, and shopping centers. They're usually staffed by nurse practitioners or physician assistants that have been trained to treat common illnesses and complaints. They're usually fairly quick and inexpensive. However, if you have serious medical issues or chronic medical problems, these are probably not your best option. ° °No Primary Care Doctor: °- Call [facility] at  832-8000 - they can help you locate a primary care doctor that  accepts your insurance, provides certain services, etc. °- Physician Referral Service- 1-800-533-3463 ° °Chronic Pain Problems: °Organization         Address  Phone   Notes  °[facility]  (336) 297-2271 Patients need to be referred by their primary care doctor.  ° °Medication  Assistance: °Organization         Address  Phone   Notes  °[place] [facility] [address] °[place], [place] [address] (336) 641-8030 --Must be a resident of [place] °-- Must have NO insurance coverage whatsoever (no Medicaid/ Medicare, etc.) °-- The pt. MUST have a primary care doctor that directs their care regularly and follows them in the community °  °[facility]  (866) 331-1348   °[employer]  (888) 892-1162   ° °Agencies that provide inexpensive medical care: °Organization         Address  Phone   Notes  °[facility]  (336) 832-8035   °[facility]    (336) 832-7272   °[facility] [address] °[place], [place] [address] (336) 832-4777   °[facility] [address], °[place] (336) 271-4999   °[facility]    (336) 373-0678   °[facility]    (336) 272-1050   °[facility] ° [address], [place] Phone:  (336) 832-4444, Fax:  (336) 832-4440 Hours of Operation:  9 am - 6 pm, M-F.  Also accepts Medicaid/Medicare and self-pay.  °[facility] ° [address], [place] Phone: (336) 832-3150, Fax: (336) 832-3151. Hours of Operation:  8:30 am - 5:30 pm, M-F.  Also accepts Medicaid and self-pay.  °[facility] [place] [address], [place] Phone: (336) 878-6027   °[facility] [address], [place], [place] (336)723-1848, Ext. 123 Mondays & Thursdays: 7-9 AM.  First 15 patients are seen on a first come, first serve basis. °  ° °Medicaid-accepting [place] Providers: ° °Organization         Address  Phone   Notes  °[facility] [address], [place] (336) 641-2100 Also accepts self-pay patients.  °[facility] [address], [place] ° (336) 856-9996   °[facility] [address], [place]  (336) 288-8857   °[facility] [address] [place] [address], [place] (336) 299-7000   °Veita Bland [address], [place]  ° (336) 373-1557 Only accepts [place] Access Medicaid patients after they have their name applied to their card.  ° °Self-Pay (no insurance) in [place]: ° °Organization         Address  Phone   Notes  °Sickle Cell Patients, [facility] [address], [place] (336) 832-1970   °[facility] [address], [place] (336) 832-4400   °[facility] ° [address], [place] (336) 992-4800   °[facility]/Dr. Osei-Bonsu ° [address] [place] [address], [place] or [address], [place] (336) 841-8500 Phone number for both [place] and [place] locations is the same.  °[facility] [address], [place] (336) 299-0000   °[facility] [address] [place] [address], [place] or [address] (336) 852-7530 °(336) 878-2260   °[facility] [address], [place] (336) 350-1642, phone; (336) 294-5005, fax Sees patients 1st and 3rd Saturday of every month.  Must not qualify for public or private insurance (i.e. Medicaid, Medicare, [place] Health Choice, Veterans' Benefits) • Household income should be no more than 200% of the poverty level •The clinic cannot treat you if you are pregnant or think you are pregnant • Sexually transmitted diseases are not treated at the clinic.  ° ° °Dental Care: °Organization         Address  Phone  Notes  °[place] [facility] [address], [place] (336) 641-6152 Accepts children up to age 21 who are enrolled in Medicaid or [place] Health Choice; pregnant women with a Medicaid card; and children who have applied for Medicaid or [place] Health Choice, but were declined, whose parents can pay a reduced fee at time of service.  °[place]  [facility] [place]  [address], [place] (336) 641-7733 Accepts children up to age 21 who are enrolled in Medicaid or [place] Health Choice; pregnant women with a Medicaid card; and children who have applied for Medicaid or [place] Health Choice, but were declined, whose parents can pay a reduced fee at time of service.  °[facility] ° [address], [place] (336) 641-4533 Patients are seen by appointment only. Walk-ins are not accepted. [facility] will see patients 18 years of age and older. °Monday - Tuesday (8am-5pm) °Most Wednesdays (8:30-5pm) °$30 per visit, cash only  °[facility] ° [address], [place] (336) 641-4533 Patients are seen by appointment only. Walk-ins are not accepted. [facility] will see patients 18 years of age and older. °One   Wednesday Evening (Monthly: Volunteer Based).  $30 per visit, cash only  °[facility]  (919) 537-3737 for adults; Children under age 4, call Graduate Pediatric Dentistry at (919) [phone]. Children aged 4-14, please call (919) [phone] to request a pediatric application. ° Dental services are provided in all areas of dental care including fillings, crowns and bridges, complete and partial dentures, implants, gum treatment, root canals, and extractions. Preventive care is also provided. Treatment is provided to both adults and children. °Patients are selected via a lottery and there is often a waiting list. °  °[facility] [address], °[place] ° (336) 763-8833 [email or web address] °  °[facility] [address], [place], [place] (336)723-1848, Ext. 123 Second and Fourth Thursday of each month, opens at 6:30 AM; Clinic ends at 9 AM.  Patients are seen on a first-come first-served basis, and a limited number are seen during each clinic.  ° °[facility] ° [address], [place], [place] (336) 723-7904    Eligibility Requirements °You must have lived in [place], [place], or [place] counties for at least the last three months. °  You cannot be eligible for state or federal sponsored healthcare insurance, including Veterans Administration, Medicaid, or Medicare. °  You generally cannot be eligible for healthcare insurance through your employer.  °  How to apply: °Eligibility screenings are held every Tuesday and Wednesday afternoon from 1:00 pm until 4:00 pm. You do not need an appointment for the interview!  °[facility] [address], [place], [place] 336-631-2330   °[facility] [facility]  336-342-8273   °[place]  336-703-3100   ° [facility]  336-570-6415   ° °Behavioral Health Resources in the Community: °Intensive Outpatient Programs °Organization         Address  Phone  Notes  °[facility] [address], [place], [place] 336-878-6098   °[facility] [address], [place], [place] 336-832-9800   °[facility] [address], [place], [place] ° 336-882-2125   °[facility] [address],  °[place], [place] 1-800-853-5163 or 336-641-4981   °Substance Abuse Resources °Organization         Address  Phone  Notes  °[facility]  336-882-2125   °[facility]  336-784-9470   °[facility]  336-285-9073   °[facility]  336-845-3988   °Residential & Outpatient Substance Abuse Program  1-800-659-3381   °Psychological Services °Organization         Address  Phone  Notes  ° Health  336- 832-9600   °[facility]  336- 378-7881   °[facility] [address], [place] 1-800-853-5163 or 336-641-4981   ° °Mobile Crisis Teams °Organization         Address  Phone  Notes  °[facility], Mobile Crisis Care Unit  1-877-626-1772   °Assertive °[facility] ° [address]  [place], [place] 336-834-9664   °Sharon DeEsch [address] °[place] 336-554-5454   ° °Self-Help/Support Groups °Organization         Address  Phone             Notes  °[facility]  - variety of support groups  336- 373-1402 Call for more information  °Narcotics Anonymous (NA), [facility] [address], °[place]  2 meetings at this location  ° °  Residential Treatment Programs °Organization         Address  Phone  Notes  °[facility] [address],    °[place]  1-866-801-8205   °[facility] ° [address], [place], [place] 704-293-8524   °[facility] [address], [place] 336-845-3988 Admissions: 8am-3pm M-F  °[facility] [address]    °[place], [place] 336-841-1104   °[facility] [address], [place], [place] 336-379-7146   °[facility] [address]  °[place], [place] 336-285-9073   °[facility] Intensive Outpatient [address], [place], [place] 336-852-3033   °[facility] [address]  °[place], [place] 1-877-615-2722 or 336-784-9470   °[facility] [address] [place], [place] 336-227-7417 Accepts Medicaid  °[facility] [address]  °[place] 1-800-659-3381 Substance Abuse/Addiction Treatment  ° °[place] Behavioral Health Resources °Organization         Address  Phone  Notes  °[facility]  (888) 581-9988   °Julie Brannon, PhD [address] [place], [place]   (336) 349-5553 or (336) 951-0000   ° Behavioral   [address] °[place], [place] (336) 349-4454   °[facility] [address], [place], [place] (336) 342-8316 Insurance/Medicaid/sponsorship through [facility]  °[facility] [address]                                    [place], [place] (336) 342-8316 Therapy/tele-psych/case    °[facility] [address]  ° [place], [place] (336) 349-2233    °Dr. Arfeen  (336) 349-4544   °[facility] [place]  [employer] [place] [facility] 1) [address], [place] °2) [address], [place] °3)  [address], [place] (336) 349-3220 °(336) 342-7768 ° °(336) 342-8140   °[place] [facility] (336) 342-1394 or (336) 342-3537 (After Hours)    ° ° °

## 2015-01-28 NOTE — ED Provider Notes (Signed)
WillCSN: [id number]     Arrival date & time 01/28/15  1122 History   First MD Initiated Contact with Patient 01/28/15 1131     Chief Complaint  Patient presents with  . Alcohol Intoxication     (Consider location/radiation/quality/duration/timing/severity/associated sxs/prior Treatment) HPI Comments: 49 yr old female with polysubstance abuse, alcohol abuse, cocaine abuse, ulcer disease, alcohol dependence, PTSD presents wanting alcohol detox. Patient has been abusing alcohol for a long time most recently the past 2 weeks. Patient recently has been drinking Listerine for alcohol intoxication. He denies any other new medications or drugs. Patient last drank this morning.  Pt intoxicated clinically. Hx of seizures unsure if from withdrawal.  Patient is a 49 y.o. female presenting with intoxication. The history is provided by the patient.  Alcohol Intoxication Pertinent negatives include no chest pain, no abdominal pain, no headaches and no shortness of breath.    Past Medical History  Diagnosis Date  . Depression   . Peptic ulcer   . Alcohol abuse   . Anemia   . Benzodiazepine abuse   . Thyroid disease   . Alcoholism   . Narcotic abuse   . Back pain   . Thrombocytopenia 06/17/2011  . Hypothyroidism   . Seizures   . Medical history non-contributory     hypoglycemic   Past Surgical History  Procedure Laterality Date  . Cholecystectomy    . Abdominal surgery    . Esophagogastroduodenoscopy    . Gastric bypass    . Tubal ligation    . Esophagogastroduodenoscopy N/A 03/23/2013    Procedure: ESOPHAGOGASTRODUODENOSCOPY (EGD);  Surgeon: Ladene Artist, MD;  Location: [facility];  Service: Endoscopy;  Laterality: N/A;  . No past surgeries      gastric bypass   Family History  Problem Relation Age of Onset  . Alcohol abuse Father   . Alcoholism Father   . Cancer Other    History  Substance Use Topics  . Smoking status: Current Every Day Smoker -- 1.00 packs/day for 12 years     Types: Cigarettes    Last Attempt to Quit: 08/08/2011  . Smokeless tobacco: Never Used  . Alcohol Use: Yes     Comment: drink 4 liters a day. 1 case/day drinks Listerine   OB History    Gravida Para Term Preterm AB TAB SAB Ectopic Multiple Living   3 3  3      3      Review of Systems  Constitutional: Negative for fever and chills.  HENT: Negative for congestion.   Eyes: Negative for visual disturbance.  Respiratory: Negative for shortness of breath.   Cardiovascular: Negative for chest pain.  Gastrointestinal: Negative for vomiting and abdominal pain.  Genitourinary: Negative for dysuria and flank pain.  Musculoskeletal: Negative for back pain, neck pain and neck stiffness.  Skin: Negative for rash.  Neurological: Negative for light-headedness and headaches.      Allergies  Nsaids  Home Medications   Prior to Admission medications   Medication Sig Start Date End Date Taking? Authorizing Provider  carbamazepine (TEGRETOL XR) 100 MG 12 hr tablet Take 3 tablets (300 mg total) by mouth 2 (two) times daily. 10/17/14  Yes Patrecia Pour, NP  clonazePAM (KLONOPIN) 0.5 MG tablet Take 0.5 mg by mouth 3 (three) times daily.   Yes Historical Provider, MD  FLUoxetine (PROZAC) 20 MG capsule Take 3 capsules (60 mg total) by mouth daily. 10/17/14  Yes Patrecia Pour, NP  folic acid (FOLVITE) 1 MG  tablet Take 1 mg by mouth daily.   Yes Historical Provider, MD  gabapentin (NEURONTIN) 800 MG tablet Take 800 mg by mouth 3 (three) times daily.   Yes Historical Provider, MD  ibuprofen (ADVIL,MOTRIN) 200 MG tablet Take 600 mg by mouth every 6 (six) hours as needed for moderate pain.   Yes Historical Provider, MD  levothyroxine (SYNTHROID, LEVOTHROID) 125 MCG tablet Take 1 tablet (125 mcg total) by mouth daily before breakfast. For thyroid hormone replacement 10/17/14  Yes Patrecia Pour, NP  omeprazole (PRILOSEC) 40 MG capsule Take 1 capsule (40 mg total) by mouth daily. 10/17/14  Yes Patrecia Pour, NP  cephALEXin (KEFLEX) 500 MG capsule Take 1 capsule (500 mg total) by mouth every 8 (eight) hours. Patient not taking: Reported on 10/29/2014 10/17/14   Patrecia Pour, NP  ciprofloxacin (CIPRO) 500 MG tablet Take 1 tablet (500 mg total) by mouth 2 (two) times daily. Patient not taking: Reported on 01/28/2015 10/29/14   Lily Kocher, PA-C  FLUoxetine (PROZAC) 20 MG capsule Take 3 capsules (60 mg total) by mouth daily. Patient not taking: Reported on 01/28/2015 05/28/14   Niel Hummer, NP   BP 125/85 mmHg  Pulse 112  Temp(Src) 98.9 F (37.2 C) (Oral)  Resp 20  Ht 5\' 9"  (1.753 m)  Wt 200 lb (90.719 kg)  BMI 29.52 kg/m2  SpO2 99%  LMP 01/14/2015 Physical Exam  Constitutional: She appears well-developed and well-nourished.  HENT:  Head: Normocephalic and atraumatic.  Mild dry mm  Eyes: Right eye exhibits no discharge. Left eye exhibits no discharge.  Neck: Normal range of motion. Neck supple. No tracheal deviation present.  Cardiovascular: Regular rhythm.  Tachycardia present.   Pulmonary/Chest: Effort normal and breath sounds normal.  Abdominal: Soft. She exhibits no distension. There is no tenderness. There is no guarding.  Musculoskeletal: She exhibits no edema.  Neurological: She is alert. No cranial nerve deficit. GCS eye subscore is 4. GCS verbal subscore is 5. GCS motor subscore is 6.  Clinically intoxicated  Skin: Skin is warm. No rash noted.  Psychiatric:  Clinically intoxicated  Nursing note and vitals reviewed.   ED Course  Procedures (including critical care time) Labs Review Labs Reviewed  CBC WITH DIFFERENTIAL/PLATELET - Abnormal; Notable for the following:    WBC 11.5 (*)    RDW 19.2 (*)    Neutrophils Relative % 82 (*)    Neutro Abs 9.4 (*)    Lymphocytes Relative 9 (*)    All other components within normal limits  COMPREHENSIVE METABOLIC PANEL - Abnormal; Notable for the following:    Sodium 131 (*)    Potassium 3.2 (*)    Chloride 93 (*)     Glucose, Bld 164 (*)    Calcium 8.2 (*)    AST 293 (*)    ALT 211 (*)    Alkaline Phosphatase 122 (*)    All other components within normal limits  ETHANOL - Abnormal; Notable for the following:    Alcohol, Ethyl (B) 211 (*)    All other components within normal limits  ACETAMINOPHEN LEVEL - Abnormal; Notable for the following:    Acetaminophen (Tylenol), Serum <10.0 (*)    All other components within normal limits  URINE RAPID DRUG SCREEN (HOSP PERFORMED) - Abnormal; Notable for the following:    Benzodiazepines POSITIVE (*)    All other components within normal limits  URINALYSIS, ROUTINE W REFLEX MICROSCOPIC - Abnormal; Notable for the following:    Hgb  urine dipstick MODERATE (*)    All other components within normal limits  LIPASE, BLOOD - Abnormal; Notable for the following:    Lipase 79 (*)    All other components within normal limits  SALICYLATE LEVEL  URINE MICROSCOPIC-ADD ON    Imaging Review No results found.   EKG Interpretation None      MDM   Final diagnoses:  Alcoholism  LFT elevation   Patient with significant alcohol and drug abuse history presents wanting alcohol detox. Patient does have seizure history unsure if alcohol withdrawal related. Patient clinically intoxicated on exam, plan for blood work, IV fluids observation until clinically sober for TTS evaluation.  Patient observed in the ER, patient requesting to leave and has changed her mind she does not want help for her alcohol abuse. On reexamination patient walking with normal gait, alert and oriented to place name birth date general time, patient understands she can return at any time and a follow-up with outpatient resources. Patient denies suicidal or homicidal ideation ER.  Results and differential diagnosis were discussed with the patient/parent/guardian. Close follow up outpatient was discussed, comfortable with the plan.   Medications  nicotine (NICODERM CQ - dosed in mg/24 hours) patch  14 mg (14 mg Transdermal Patch Applied 01/28/15 1218)  LORazepam (ATIVAN) tablet 0-4 mg (not administered)  LORazepam (ATIVAN) tablet 0-4 mg (1 mg Oral Given 01/28/15 1218)  LORazepam (ATIVAN) injection 0-4 mg (not administered)  LORazepam (ATIVAN) injection 0-4 mg (not administered)  thiamine (VITAMIN B-1) tablet 100 mg (not administered)  thiamine (B-1) injection 100 mg (not administered)  potassium chloride SA (K-DUR,KLOR-CON) CR tablet 40 mEq (not administered)  sodium chloride 0.9 % bolus 1,000 mL (0 mLs Intravenous Stopped 01/28/15 1413)    Filed Vitals:   01/28/15 1126  BP: 125/85  Pulse: 112  Temp: 98.9 F (37.2 C)  TempSrc: Oral  Resp: 20  Height: 5\' 9"  (1.753 m)  Weight: 200 lb (90.719 kg)  SpO2: 99%    Final diagnoses:  Alcoholism  LFT elevation        Elnora Morrison, MD 01/28/15 1426

## 2015-02-16 ENCOUNTER — Emergency Department (HOSPITAL_COMMUNITY)
Admission: EM | Admit: 2015-02-16 | Discharge: 2015-02-16 | Discharge status: Against Medical Advice | Payer: Self-pay | Attending: Emergency Medicine | Admitting: Emergency Medicine

## 2015-02-16 ENCOUNTER — Encounter (HOSPITAL_COMMUNITY): Payer: Self-pay | Admitting: Emergency Medicine

## 2015-02-16 DIAGNOSIS — Y9389 Activity, other specified: Secondary | ICD-10-CM | POA: Insufficient documentation

## 2015-02-16 DIAGNOSIS — Y998 Other external cause status: Secondary | ICD-10-CM | POA: Insufficient documentation

## 2015-02-16 DIAGNOSIS — Z720 Tobacco use: Secondary | ICD-10-CM | POA: Insufficient documentation

## 2015-02-16 DIAGNOSIS — Y9289 Other specified places as the place of occurrence of the external cause: Secondary | ICD-10-CM | POA: Insufficient documentation

## 2015-02-16 DIAGNOSIS — T510X4A Toxic effect of ethanol, undetermined, initial encounter: Secondary | ICD-10-CM | POA: Insufficient documentation

## 2015-02-16 DIAGNOSIS — F10929 Alcohol use, unspecified with intoxication, unspecified: Secondary | ICD-10-CM

## 2015-02-16 DIAGNOSIS — F10129 Alcohol abuse with intoxication, unspecified: Secondary | ICD-10-CM | POA: Insufficient documentation

## 2015-02-16 NOTE — ED Provider Notes (Signed)
I did not see or evaluate the patient.  Prior to the patient being up to be triaged she walked back out of the emergency department.  The nurse attempted to obtain vital signs, but the patient walked out without difficulty.  Jola Schmidt, MD 02/16/15 [phone]

## 2015-02-16 NOTE — ED Notes (Signed)
Patient noted to be arguing with family member that was with her.  She refused help and walked out of the ED department.  She stated she did not "want to stay her"

## 2015-02-16 NOTE — ED Notes (Signed)
Pt states that she needs help to stop drinking alcohol.  Pt does not drink liquor but drinks hand sanitizer, Listerine, and anything else in over the counter medications.

## 2015-02-16 NOTE — ED Notes (Signed)
Pt. Wheeled back to room. Pt. Stating "my liver hurts". Pt. Climbed in bed. RN placed wheelchair in hallway. When RN returned to room pt. Climbed out of bed and stated "I'm leaving". Asked pt. To allow RN to check vitals, pt. Refused. Pt. Refused to sign out. Pt. Ambulatory out of department.

## 2015-02-17 ENCOUNTER — Encounter (HOSPITAL_COMMUNITY): Payer: Self-pay | Admitting: *Deleted

## 2015-02-17 ENCOUNTER — Emergency Department (HOSPITAL_COMMUNITY)
Admission: EM | Admit: 2015-02-17 | Discharge: 2015-02-17 | Disposition: A | Discharge status: Other Acute Inpatient Hospital | Payer: Self-pay | Attending: Psychiatry | Admitting: Psychiatry

## 2015-02-17 ENCOUNTER — Inpatient Hospital Stay (HOSPITAL_COMMUNITY)
Admission: AD | Admit: 2015-02-17 | Discharge: 2015-02-23 | DRG: 897 | Disposition: A | Discharge status: Home / Self Care | Payer: 59 | Source: Intra-hospital | Attending: Psychiatry | Admitting: Psychiatry

## 2015-02-17 DIAGNOSIS — F10230 Alcohol dependence with withdrawal, uncomplicated: Secondary | ICD-10-CM | POA: Diagnosis present

## 2015-02-17 DIAGNOSIS — F410 Panic disorder [episodic paroxysmal anxiety] without agoraphobia: Secondary | ICD-10-CM | POA: Diagnosis present

## 2015-02-17 DIAGNOSIS — Z8711 Personal history of peptic ulcer disease: Secondary | ICD-10-CM | POA: Insufficient documentation

## 2015-02-17 DIAGNOSIS — F17210 Nicotine dependence, cigarettes, uncomplicated: Secondary | ICD-10-CM | POA: Diagnosis present

## 2015-02-17 DIAGNOSIS — F329 Major depressive disorder, single episode, unspecified: Secondary | ICD-10-CM | POA: Diagnosis not present

## 2015-02-17 DIAGNOSIS — F4310 Post-traumatic stress disorder, unspecified: Secondary | ICD-10-CM | POA: Diagnosis present

## 2015-02-17 DIAGNOSIS — G40909 Epilepsy, unspecified, not intractable, without status epilepticus: Secondary | ICD-10-CM | POA: Insufficient documentation

## 2015-02-17 DIAGNOSIS — F10239 Alcohol dependence with withdrawal, unspecified: Secondary | ICD-10-CM | POA: Diagnosis present

## 2015-02-17 DIAGNOSIS — F314 Bipolar disorder, current episode depressed, severe, without psychotic features: Secondary | ICD-10-CM | POA: Diagnosis present

## 2015-02-17 DIAGNOSIS — D649 Anemia, unspecified: Secondary | ICD-10-CM | POA: Insufficient documentation

## 2015-02-17 DIAGNOSIS — D329 Benign neoplasm of meninges, unspecified: Secondary | ICD-10-CM | POA: Insufficient documentation

## 2015-02-17 DIAGNOSIS — F3130 Bipolar disorder, current episode depressed, mild or moderate severity, unspecified: Secondary | ICD-10-CM | POA: Diagnosis not present

## 2015-02-17 DIAGNOSIS — Z3202 Encounter for pregnancy test, result negative: Secondary | ICD-10-CM | POA: Insufficient documentation

## 2015-02-17 DIAGNOSIS — F1010 Alcohol abuse, uncomplicated: Secondary | ICD-10-CM | POA: Insufficient documentation

## 2015-02-17 DIAGNOSIS — Z79899 Other long term (current) drug therapy: Secondary | ICD-10-CM | POA: Insufficient documentation

## 2015-02-17 DIAGNOSIS — E039 Hypothyroidism, unspecified: Secondary | ICD-10-CM | POA: Diagnosis present

## 2015-02-17 DIAGNOSIS — Z9884 Bariatric surgery status: Secondary | ICD-10-CM

## 2015-02-17 DIAGNOSIS — R45851 Suicidal ideations: Secondary | ICD-10-CM | POA: Diagnosis present

## 2015-02-17 DIAGNOSIS — G4700 Insomnia, unspecified: Secondary | ICD-10-CM | POA: Diagnosis present

## 2015-02-17 DIAGNOSIS — Z811 Family history of alcohol abuse and dependence: Secondary | ICD-10-CM

## 2015-02-17 DIAGNOSIS — Z915 Personal history of self-harm: Secondary | ICD-10-CM | POA: Insufficient documentation

## 2015-02-17 DIAGNOSIS — Z720 Tobacco use: Secondary | ICD-10-CM | POA: Insufficient documentation

## 2015-02-17 DIAGNOSIS — Z792 Long term (current) use of antibiotics: Secondary | ICD-10-CM | POA: Insufficient documentation

## 2015-02-17 DIAGNOSIS — F32A Depression, unspecified: Secondary | ICD-10-CM

## 2015-02-17 DIAGNOSIS — F1910 Other psychoactive substance abuse, uncomplicated: Secondary | ICD-10-CM | POA: Diagnosis not present

## 2015-02-17 HISTORY — DX: Hypoglycemia, unspecified: E16.2

## 2015-02-17 HISTORY — DX: Suicide attempt, initial encounter: T14.91XA

## 2015-02-17 LAB — COMPREHENSIVE METABOLIC PANEL
ALK PHOS: 99 U/L (ref 38–126)
ALT: 94 U/L — ABNORMAL HIGH (ref 14–54)
ANION GAP: 15 (ref 5–15)
AST: 84 U/L — ABNORMAL HIGH (ref 15–41)
Albumin: 3.9 g/dL (ref 3.5–5.0)
BILIRUBIN TOTAL: 0.3 mg/dL (ref 0.3–1.2)
BUN: 7 mg/dL (ref 6–20)
CO2: 26 mmol/L (ref 22–32)
Calcium: 7.9 mg/dL — ABNORMAL LOW (ref 8.9–10.3)
Chloride: 94 mmol/L — ABNORMAL LOW (ref 101–111)
Creatinine, Ser: 0.7 mg/dL (ref 0.44–1.00)
GFR calc Af Amer: >60 mL/min (ref >60)
GLUCOSE: 105 mg/dL — AB (ref 65–99)
POTASSIUM: 3.3 mmol/L — AB (ref 3.5–5.1)
Sodium: 135 mmol/L (ref 135–145)
Total Protein: 7 g/dL (ref 6.5–8.1)

## 2015-02-17 LAB — CBC WITH DIFFERENTIAL/PLATELET
BASOS ABS: 0.1 10*3/uL (ref 0.0–0.1)
Basophils Relative: 1 % (ref 0–1)
Eosinophils Absolute: 0.1 10*3/uL (ref 0.0–0.7)
Eosinophils Relative: 3 % (ref 0–5)
HCT: 37.6 % (ref 36.0–46.0)
Hemoglobin: 12.7 g/dL (ref 12.0–15.0)
Lymphocytes Relative: 36 % (ref 12–46)
Lymphs Abs: 1.6 10*3/uL (ref 0.7–4.0)
MCH: 32 pg (ref 26.0–34.0)
MCHC: 33.8 g/dL (ref 30.0–36.0)
MCV: 94.7 fL (ref 78.0–100.0)
Monocytes Absolute: 0.7 10*3/uL (ref 0.1–1.0)
Monocytes Relative: 15 % — ABNORMAL HIGH (ref 3–12)
NEUTROS ABS: 2.1 10*3/uL (ref 1.7–7.7)
NEUTROS PCT: 45 % (ref 43–77)
Platelets: 516 10*3/uL — ABNORMAL HIGH (ref 150–400)
RBC: 3.97 MIL/uL (ref 3.87–5.11)
RDW: 16.5 % — AB (ref 11.5–15.5)
WBC: 4.6 10*3/uL (ref 4.0–10.5)

## 2015-02-17 LAB — ETHANOL: ALCOHOL ETHYL (B): 397 mg/dL — AB (ref <5)

## 2015-02-17 LAB — RAPID URINE DRUG SCREEN, HOSP PERFORMED
Amphetamines: NOT DETECTED
Barbiturates: NOT DETECTED
Benzodiazepines: NOT DETECTED
COCAINE: NOT DETECTED
OPIATES: NOT DETECTED
Tetrahydrocannabinol: NOT DETECTED

## 2015-02-17 LAB — ACETAMINOPHEN LEVEL

## 2015-02-17 LAB — PREGNANCY, URINE: PREG TEST UR: NEGATIVE

## 2015-02-17 LAB — SALICYLATE LEVEL

## 2015-02-17 MED ORDER — PANTOPRAZOLE SODIUM 40 MG PO TBEC
40.0000 mg | DELAYED_RELEASE_TABLET | Freq: Every day | ORAL | Status: DC
  Administered 2015-02-18: 40 mg via ORAL
  Filled 2015-02-17 (×3): qty 1

## 2015-02-17 MED ORDER — VITAMIN B-1 100 MG PO TABS
100.0000 mg | ORAL_TABLET | Freq: Every day | ORAL | Status: DC
  Administered 2015-02-17: 100 mg via ORAL
  Filled 2015-02-17: qty 1

## 2015-02-17 MED ORDER — VITAMIN B-1 100 MG PO TABS
100.0000 mg | ORAL_TABLET | Freq: Every day | ORAL | Status: DC
  Administered 2015-02-18 – 2015-02-23 (×4): 100 mg via ORAL
  Filled 2015-02-17 (×8): qty 1

## 2015-02-17 MED ORDER — ADULT MULTIVITAMIN W/MINERALS CH
1.0000 | ORAL_TABLET | Freq: Every day | ORAL | Status: DC
  Administered 2015-02-19: 1 via ORAL
  Filled 2015-02-17 (×8): qty 1

## 2015-02-17 MED ORDER — THIAMINE HCL 100 MG/ML IJ SOLN
100.0000 mg | Freq: Once | INTRAMUSCULAR | Status: AC
  Administered 2015-02-17: 100 mg via INTRAMUSCULAR
  Filled 2015-02-17: qty 2

## 2015-02-17 MED ORDER — CHLORDIAZEPOXIDE HCL 25 MG PO CAPS: 25.0000 mg | ORAL_CAPSULE | ORAL | Status: DC

## 2015-02-17 MED ORDER — ACETAMINOPHEN 325 MG PO TABS
650.0000 mg | ORAL_TABLET | Freq: Four times a day (QID) | ORAL | Status: DC | PRN
  Administered 2015-02-18 – 2015-02-19 (×2): 650 mg via ORAL
  Filled 2015-02-17 (×2): qty 2

## 2015-02-17 MED ORDER — FOLIC ACID 1 MG PO TABS
1.0000 mg | ORAL_TABLET | Freq: Every day | ORAL | Status: DC
  Administered 2015-02-18 – 2015-02-23 (×6): 1 mg via ORAL
  Filled 2015-02-17 (×8): qty 1

## 2015-02-17 MED ORDER — GABAPENTIN 800 MG PO TABS
800.0000 mg | ORAL_TABLET | Freq: Three times a day (TID) | ORAL | Status: DC
  Administered 2015-02-18 – 2015-02-23 (×17): 800 mg via ORAL
  Filled 2015-02-17 (×23): qty 1

## 2015-02-17 MED ORDER — ONDANSETRON 4 MG PO TBDP
4.0000 mg | ORAL_TABLET | Freq: Four times a day (QID) | ORAL | Status: AC | PRN
  Administered 2015-02-17 – 2015-02-20 (×6): 4 mg via ORAL
  Filled 2015-02-17 (×6): qty 1

## 2015-02-17 MED ORDER — ALUM & MAG HYDROXIDE-SIMETH 200-200-20 MG/5ML PO SUSP
30.0000 mL | ORAL | Status: DC | PRN
  Administered 2015-02-21 (×2): 30 mL via ORAL
  Filled 2015-02-17 (×2): qty 30

## 2015-02-17 MED ORDER — CARBAMAZEPINE ER 200 MG PO TB12
300.0000 mg | ORAL_TABLET | Freq: Two times a day (BID) | ORAL | Status: DC
  Administered 2015-02-17 – 2015-02-20 (×6): 300 mg via ORAL
  Filled 2015-02-17 (×14): qty 1

## 2015-02-17 MED ORDER — CHLORDIAZEPOXIDE HCL 25 MG PO CAPS: 25.0000 mg | ORAL_CAPSULE | Freq: Every day | ORAL | Status: DC

## 2015-02-17 MED ORDER — LOPERAMIDE HCL 2 MG PO CAPS: 2.0000 mg | ORAL_CAPSULE | ORAL | Status: AC | PRN

## 2015-02-17 MED ORDER — FLUOXETINE HCL 20 MG PO CAPS
60.0000 mg | ORAL_CAPSULE | Freq: Every day | ORAL | Status: DC
  Administered 2015-02-18 – 2015-02-23 (×6): 60 mg via ORAL
  Filled 2015-02-17 (×9): qty 3

## 2015-02-17 MED ORDER — LEVOTHYROXINE SODIUM 125 MCG PO TABS
125.0000 ug | ORAL_TABLET | Freq: Every day | ORAL | Status: DC
  Administered 2015-02-18 – 2015-02-22 (×5): 125 ug via ORAL
  Filled 2015-02-17 (×7): qty 1

## 2015-02-17 MED ORDER — LORAZEPAM 1 MG PO TABS
0.0000 mg | ORAL_TABLET | Freq: Four times a day (QID) | ORAL | Status: DC
  Administered 2015-02-17: 2 mg via ORAL
  Filled 2015-02-17: qty 2

## 2015-02-17 MED ORDER — CHLORDIAZEPOXIDE HCL 25 MG PO CAPS
25.0000 mg | ORAL_CAPSULE | Freq: Four times a day (QID) | ORAL | Status: DC
  Administered 2015-02-17 – 2015-02-18 (×2): 25 mg via ORAL
  Filled 2015-02-17 (×2): qty 1

## 2015-02-17 MED ORDER — CHLORDIAZEPOXIDE HCL 25 MG PO CAPS
25.0000 mg | ORAL_CAPSULE | Freq: Four times a day (QID) | ORAL | Status: DC | PRN
  Administered 2015-02-17 – 2015-02-18 (×2): 25 mg via ORAL
  Filled 2015-02-17 (×2): qty 1

## 2015-02-17 MED ORDER — HYDROXYZINE HCL 25 MG PO TABS
25.0000 mg | ORAL_TABLET | Freq: Four times a day (QID) | ORAL | Status: AC | PRN
  Administered 2015-02-18 (×2): 25 mg via ORAL
  Filled 2015-02-17 (×2): qty 1

## 2015-02-17 MED ORDER — THIAMINE HCL 100 MG/ML IJ SOLN
100.0000 mg | Freq: Every day | INTRAMUSCULAR | Status: DC
Start: 2015-02-17 — End: 2015-02-17

## 2015-02-17 MED ORDER — LORAZEPAM 1 MG PO TABS: 0.0000 mg | ORAL_TABLET | Freq: Two times a day (BID) | ORAL | Status: DC

## 2015-02-17 MED ORDER — CHLORDIAZEPOXIDE HCL 25 MG PO CAPS: 25.0000 mg | ORAL_CAPSULE | Freq: Three times a day (TID) | ORAL | Status: DC

## 2015-02-17 MED ORDER — MAGNESIUM HYDROXIDE 400 MG/5ML PO SUSP
30.0000 mL | Freq: Every day | ORAL | Status: DC | PRN
  Administered 2015-02-20 – 2015-02-21 (×2): 30 mL via ORAL
  Filled 2015-02-17 (×2): qty 30

## 2015-02-17 NOTE — ED Notes (Signed)
CRITICAL VALUE ALERT  Critical value received:  Alcohol .397  Date of notification:  02/17/15  Time of notification:  1001  Critical value read back:Yes.    Nurse who received alert:  [place]  MD notified (1st page):  Dr. Thurnell Garbe  Time of first page:  23  MD notified (2nd page):  Time of second page:  Responding MD:  Dr. Thurnell Garbe  Time MD responded:  1001

## 2015-02-17 NOTE — ED Notes (Addendum)
[place] Police here at this time to serve IVC paperwork (final papers). [facility] called at this time and I asked about her admission and permission to set up transport to [facility] 302-2 at this time.

## 2015-02-17 NOTE — ED Provider Notes (Signed)
CSN: [id number]     Arrival date & time 02/17/15  0174 History   First MD Initiated Contact with Patient 02/17/15 [phone]     Chief Complaint  Patient presents with  . V70.1      The history is provided by the patient. The history is limited by the condition of the patient (Intoxicated).  Pt was seen at 0850. Per pt: states she came to the ED "because I really need some help." Pt states she "needs to stop drinking." Pt has been evaluated in the ED twice yesterday, but left. Pt states she "left those times because I keep fighting it." LD etoh approximately 1 hour PTA. Today, pt endorses SI, stating she would OD on pills "if I can't make it without drinking." Denies SA, no HI, no hallucinations.   Past Medical History  Diagnosis Date  . Depression   . Peptic ulcer   . Alcohol abuse   . Anemia   . Benzodiazepine abuse   . Thyroid disease   . Alcoholism   . Narcotic abuse   . Back pain   . Thrombocytopenia 06/17/2011  . Hypothyroidism   . Seizures   . Hypoglycemia   . Suicide attempt     multiple times   Past Surgical History  Procedure Laterality Date  . Cholecystectomy    . Abdominal surgery    . Esophagogastroduodenoscopy    . Gastric bypass    . Tubal ligation    . Esophagogastroduodenoscopy N/A 03/23/2013    Procedure: ESOPHAGOGASTRODUODENOSCOPY (EGD);  Surgeon: Ladene Artist, MD;  Location: [facility];  Service: Endoscopy;  Laterality: N/A;   Family History  Problem Relation Age of Onset  . Alcohol abuse Father   . Alcoholism Father   . Cancer Other    History  Substance Use Topics  . Smoking status: Current Every Day Smoker -- 1.00 packs/day for 12 years    Types: Cigarettes    Last Attempt to Quit: 08/08/2011  . Smokeless tobacco: Never Used  . Alcohol Use: Yes     Comment: drink 4 liters a day. 1 case/day drinks Listerine   OB History    Gravida Para Term Preterm AB TAB SAB Ectopic Multiple Living   3 3  3      3      Review of Systems  Unable to perform  ROS: Psychiatric disorder    Allergies  Nsaids  Home Medications   Prior to Admission medications   Medication Sig Start Date End Date Taking? Authorizing Provider  carbamazepine (TEGRETOL XR) 100 MG 12 hr tablet Take 3 tablets (300 mg total) by mouth 2 (two) times daily. 10/17/14   Patrecia Pour, NP  cephALEXin (KEFLEX) 500 MG capsule Take 1 capsule (500 mg total) by mouth every 8 (eight) hours. Patient not taking: Reported on 10/29/2014 10/17/14   Patrecia Pour, NP  chlordiazePOXIDE (LIBRIUM) 25 MG capsule 50mg  PO TID x 1D, then 25-50mg  PO BID X 1D, then 25-50mg  PO QD X 1D 01/28/15   Elnora Morrison, MD  ciprofloxacin (CIPRO) 500 MG tablet Take 1 tablet (500 mg total) by mouth 2 (two) times daily. Patient not taking: Reported on 01/28/2015 10/29/14   Lily Kocher, PA-C  clonazePAM (KLONOPIN) 0.5 MG tablet Take 0.5 mg by mouth 3 (three) times daily.    Historical Provider, MD  FLUoxetine (PROZAC) 20 MG capsule Take 3 capsules (60 mg total) by mouth daily. Patient not taking: Reported on 01/28/2015 05/28/14   Niel Hummer, NP  FLUoxetine (PROZAC) 20 MG capsule Take 3 capsules (60 mg total) by mouth daily. 10/17/14   Patrecia Pour, NP  folic acid (FOLVITE) 1 MG tablet Take 1 mg by mouth daily.    Historical Provider, MD  gabapentin (NEURONTIN) 800 MG tablet Take 800 mg by mouth 3 (three) times daily.    Historical Provider, MD  ibuprofen (ADVIL,MOTRIN) 200 MG tablet Take 600 mg by mouth every 6 (six) hours as needed for moderate pain.    Historical Provider, MD  levothyroxine (SYNTHROID, LEVOTHROID) 125 MCG tablet Take 1 tablet (125 mcg total) by mouth daily before breakfast. For thyroid hormone replacement 10/17/14   Patrecia Pour, NP  omeprazole (PRILOSEC) 40 MG capsule Take 1 capsule (40 mg total) by mouth daily. 10/17/14   Patrecia Pour, NP   BP 146/98 mmHg  Pulse 107  Temp(Src) 97.8 F (36.6 C) (Oral)  Resp 18  Ht 5\' 9"  (1.753 m)  Wt 196 lb (88.905 kg)  BMI 28.93 kg/m2  SpO2 99%  LMP  01/14/2015 Physical Exam  0855: Physical examination:  Nursing notes reviewed; Vital signs and O2 SAT reviewed;  Constitutional: Well developed, Well nourished, Well hydrated, In no acute distress; Head:  Normocephalic, atraumatic; Eyes: EOMI, PERRL, No scleral icterus; ENMT: Mouth and pharynx normal, Mucous membranes moist; Neck: Supple, Full range of motion, No lymphadenopathy; Cardiovascular: Regular rate and rhythm, No gallop; Respiratory: Breath sounds clear & equal bilaterally, No wheezes.  Speaking full sentences with ease, Normal respiratory effort/excursion; Chest: Nontender, Movement normal; Abdomen: Soft, Nontender, Nondistended, Normal bowel sounds; Extremities: Pulses normal, No tenderness, No edema, No calf edema or asymmetry.; Neuro: AA&Ox3, Major CN grossly intact.  Speech slurred. Climbs on and off stretcher easily by herself. Gait unsteady. No gross focal motor deficits in extremities.; Skin: Color normal, Warm, Dry.; Psych:  Affect flat, poor eye contact. Endorses SI. Clinically appears intoxicated.     ED Course  Procedures     EKG Interpretation None      MDM  MDM Reviewed: previous chart, nursing note and vitals Reviewed previous: labs Interpretation: labs     Results for orders placed or performed during the hospital encounter of 02/17/15  Urine rapid drug screen (hosp performed)  Result Value Ref Range   Opiates NONE DETECTED NONE DETECTED   Cocaine NONE DETECTED NONE DETECTED   Benzodiazepines NONE DETECTED NONE DETECTED   Amphetamines NONE DETECTED NONE DETECTED   Tetrahydrocannabinol NONE DETECTED NONE DETECTED   Barbiturates NONE DETECTED NONE DETECTED  Pregnancy, urine  Result Value Ref Range   Preg Test, Ur NEGATIVE NEGATIVE  Comprehensive metabolic panel  Result Value Ref Range   Sodium 135 135 - 145 mmol/L   Potassium 3.3 (L) 3.5 - 5.1 mmol/L   Chloride 94 (L) 101 - 111 mmol/L   CO2 26 22 - 32 mmol/L   Glucose, Bld 105 (H) 65 - 99 mg/dL   BUN  7 6 - 20 mg/dL   Creatinine, Ser 0.70 0.44 - 1.00 mg/dL   Calcium 7.9 (L) 8.9 - 10.3 mg/dL   Total Protein 7.0 6.5 - 8.1 g/dL   Albumin 3.9 3.5 - 5.0 g/dL   AST 84 (H) 15 - 41 U/L   ALT 94 (H) 14 - 54 U/L   Alkaline Phosphatase 99 38 - 126 U/L   Total Bilirubin 0.3 0.3 - 1.2 mg/dL   GFR calc non Af Amer >60 >60 mL/min   GFR calc Af Amer >60 >60 mL/min   Anion gap  15 5 - 15  Ethanol  Result Value Ref Range   Alcohol, Ethyl (B) 397 (HH) <5 mg/dL  CBC with Differential  Result Value Ref Range   WBC 4.6 4.0 - 10.5 K/uL   RBC 3.97 3.87 - 5.11 MIL/uL   Hemoglobin 12.7 12.0 - 15.0 g/dL   HCT 37.6 36.0 - 46.0 %   MCV 94.7 78.0 - 100.0 fL   MCH 32.0 26.0 - 34.0 pg   MCHC 33.8 30.0 - 36.0 g/dL   RDW 16.5 (H) 11.5 - 15.5 %   Platelets 516 (H) 150 - 400 K/uL   Neutrophils Relative % 45 43 - 77 %   Neutro Abs 2.1 1.7 - 7.7 K/uL   Lymphocytes Relative 36 12 - 46 %   Lymphs Abs 1.6 0.7 - 4.0 K/uL   Monocytes Relative 15 (H) 3 - 12 %   Monocytes Absolute 0.7 0.1 - 1.0 K/uL   Eosinophils Relative 3 0 - 5 %   Eosinophils Absolute 0.1 0.0 - 0.7 K/uL   Basophils Relative 1 0 - 1 %   Basophils Absolute 0.1 0.0 - 0.1 K/uL  Salicylate level  Result Value Ref Range   Salicylate Lvl <3.3 2.8 - 30.0 mg/dL  Acetaminophen level  Result Value Ref Range   Acetaminophen (Tylenol), Serum <10 (L) 10 - 30 ug/mL    1015:  Pt seen running out of the ED. Security unable to stop pt. IVC paperwork started, as pt endorsed SI by "OD on pills" and has hx of multiple attempts. [place] PD called to bring pt back to ED.   1100:  Pt back in ED. CIWA protocol ordered. Pt has been ambulatory with steady gait, easy resps, NAD. TTS to eval.  1430:  TTS has evaluated pt: pt meets inpt criteria for dual diagnosis. Pt has been accepted to [facility], Dr. Sabra Heck, Rm 302-2. Will transfer stable.      Francine Graven, DO 02/19/15 1455

## 2015-02-17 NOTE — Progress Notes (Signed)
Per Lyda Jester, pt accepted to [facility] bed 302-2 by Dr. Sabra Heck.   Spoke with [facility] RN regarding pt's placement.  Sharren Bridge, MSW, [place] Clinical Social Work, Disposition  02/17/2015 [phone]

## 2015-02-17 NOTE — ED Notes (Signed)
[place] PD called at this time for transport set up for transfer to [facility].

## 2015-02-17 NOTE — ED Notes (Signed)
MD made aware by [place] PD that IVC papers would have to go through the [facility] before pt could be brought back to ED for tx.

## 2015-02-17 NOTE — ED Notes (Signed)
PT jeans, shirt and shoes locked in psych locker at this time. The man

## 2015-02-17 NOTE — ED Notes (Signed)
PT arrived back to room at this time from her home in street clothes.

## 2015-02-17 NOTE — ED Notes (Signed)
Report given to Patty, RN at this time for [facility] room 302-2.

## 2015-02-17 NOTE — Progress Notes (Signed)
Seeking inpatient placement:  Faxed referral to and/or under review: [facility] (for waitlist)- per Lurlean Horns- per [facility]- per Margaretha Sheffield- per Kerry Dory  At capacity 02/17/15: [facility] Cella- per [facility]- per [facility]- per Darlin Drop and [facility]- cannot take referral d/t pt having MCD coverage  Carla at [facility] states call back after 1pm to check on bed availability.  Sharren Bridge, MSW, [place] Clinical Social Work, Disposition  02/17/2015 [phone]
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
## 2015-02-17 NOTE — ED Notes (Signed)
PT ran out of ED and security was unable to stop pt. MD (Dr. Thurnell Garbe made aware) and [place] PD called to bring pt back to ED d/t SI today and blood alcohol level. PT left in wine colored scrubs.

## 2015-02-17 NOTE — ED Notes (Signed)
Pt states she can't stop drinking alcohol and her liver is hurting. Pt states she drank listerine "just a little while ago". Pt states SI stating, "If I can't make it on the streets without drinking then yes, I want to hurt myself. Pt states she would overdose on pills as her plan.

## 2015-02-17 NOTE — ED Notes (Signed)
PT placed back in wine scrubs at this time with SI sitter at bedside and [place] PD outside of room.

## 2015-02-18 DIAGNOSIS — F3130 Bipolar disorder, current episode depressed, mild or moderate severity, unspecified: Secondary | ICD-10-CM

## 2015-02-18 DIAGNOSIS — F1910 Other psychoactive substance abuse, uncomplicated: Secondary | ICD-10-CM

## 2015-02-18 MED ORDER — LORAZEPAM 1 MG PO TABS
1.0000 mg | ORAL_TABLET | Freq: Three times a day (TID) | ORAL | Status: AC
  Administered 2015-02-19 (×3): 1 mg via ORAL
  Filled 2015-02-18 (×3): qty 1

## 2015-02-18 MED ORDER — NICOTINE 21 MG/24HR TD PT24
21.0000 mg | MEDICATED_PATCH | Freq: Every day | TRANSDERMAL | Status: DC
  Administered 2015-02-18 – 2015-02-23 (×6): 21 mg via TRANSDERMAL
  Filled 2015-02-18 (×8): qty 1

## 2015-02-18 MED ORDER — LORAZEPAM 1 MG PO TABS
1.0000 mg | ORAL_TABLET | Freq: Four times a day (QID) | ORAL | Status: AC
  Administered 2015-02-18 (×4): 1 mg via ORAL
  Filled 2015-02-18 (×4): qty 1

## 2015-02-18 MED ORDER — LORAZEPAM 1 MG PO TABS
1.0000 mg | ORAL_TABLET | Freq: Two times a day (BID) | ORAL | Status: AC
  Administered 2015-02-20 (×2): 1 mg via ORAL
  Filled 2015-02-18 (×2): qty 1

## 2015-02-18 MED ORDER — LORAZEPAM 1 MG PO TABS
1.0000 mg | ORAL_TABLET | Freq: Four times a day (QID) | ORAL | Status: AC | PRN
  Administered 2015-02-19 – 2015-02-21 (×5): 1 mg via ORAL
  Filled 2015-02-18 (×6): qty 1

## 2015-02-18 MED ORDER — LORAZEPAM 1 MG PO TABS
1.0000 mg | ORAL_TABLET | Freq: Every day | ORAL | Status: AC
  Administered 2015-02-21: 1 mg via ORAL
  Filled 2015-02-18: qty 1

## 2015-02-18 MED ORDER — ENSURE ENLIVE PO LIQD
237.0000 mL | Freq: Two times a day (BID) | ORAL | Status: DC
  Administered 2015-02-18 – 2015-02-23 (×9): 237 mL via ORAL

## 2015-02-18 MED ORDER — THIAMINE HCL 100 MG/ML IJ SOLN
100.0000 mg | Freq: Once | INTRAMUSCULAR | Status: DC
  Filled 2015-02-18: qty 2

## 2015-02-18 MED ORDER — TRAZODONE HCL 50 MG PO TABS
50.0000 mg | ORAL_TABLET | Freq: Every evening | ORAL | Status: DC | PRN
  Administered 2015-02-18 – 2015-02-20 (×4): 50 mg via ORAL
  Filled 2015-02-18 (×3): qty 1

## 2015-02-18 NOTE — Progress Notes (Signed)
D.  Pt in bed on approach, requested some crackers and ice water.  Pt did not attend evening wrap up group as she was not feeling well.  No interaction with peers this shift, Pt has remained in bed.  Pt did take bedtime medications as ordered   A.  Medications given as ordered, support and encouragement offered.  Snacks provided.  R.  Pt remains safe on unit, will continue to monitor.
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
## 2015-02-18 NOTE — Progress Notes (Signed)
D: Patient is alert and oriented. Pt's mood and affect is depressed and flat. Pt denies SI/HI. Pt reports AH and VH of "voices," "seeing things," pt points to the ceiling. Pt's speech is slow and slurred, pt mumbles at times and is hard to understand. Pt's motor activity is unsteady, tremors, and slow. Pt complains of withdrawal symptoms including tremors, nausea, and unsteadiness. Pt complains of headache this afternoon 6/10. Pt complains of breakthrough withdrawal this afternoon, pt is observed with tremors. Pt is not attending unit groups today. A: Active listening by RN. Encouragement/Support provided to pt. Medication education reviewed with pt. PRN medication administered for nausea, pain, and withdrawal per providers orders (See MAR). Scheduled medications administered per providers orders (See MAR). 15 minute checks continued per protocol for patient safety.  R: Patient cooperative and receptive to nursing interventions. Pt remains safe.

## 2015-02-18 NOTE — Progress Notes (Signed)
Pt attended [facility] group and was attentative and appropriate

## 2015-02-18 NOTE — Progress Notes (Signed)
Patient ID: Jacqueline [name], female   DOB: 10-26-1965, 49 y.o.   MRN: [id number] Client is a 49 yo female admitted involuntarily with suiciidal ideation and alcohol abuse. "I want to get help with alcohol" Client has had multiple admission at [facility]. She lives with a friend and currently is not sure if she wants long term treatment. Client has a medical history of peptic ulcer, anemia, thyroid disease, and seizures with withdrawals (last sz 2014). Client oriented to unit, given drink. Staff will monitor q57min for safety. Client is safe on the unit.

## 2015-02-18 NOTE — Plan of Care (Signed)
Problem: Alteration in mood & ability to function due to Goal: LTG-Pt reports reduction in suicidal thoughts (Patient reports reduction in suicidal thoughts and is able to verbalize a safety plan for whenever patient is feeling suicidal)  Outcome: Progressing Patient denies having any suicidal thoughts today.  Goal: STG-Patient will report withdrawal symptoms Outcome: Progressing Patient has reported withdrawal symptoms to RN today, including nausea, tremors, anxiety, and generalized weakness.  Problem: Ineffective individual coping Goal: STG: Patient will remain free from self harm Outcome: Progressing Patient remains free from self harm. 15 minute checks continued per protocol for patient safety.

## 2015-02-18 NOTE — BHH Group Notes (Signed)
[facility] LCSW Group Therapy  02/18/2015 1:18 PM  Type of Therapy:  Group Therapy  Participation Level:  Did Not Attend-pt invited. CHOSE TO STAY IN BED.   Summary of Progress/Problems: Feelings around Relapse. Group members discussed the meaning of relapse and shared personal stories of relapse, how it affected them and others, and how they perceived themselves during this time. Group members were encouraged to identify triggers, warning signs and coping skills used when facing the possibility of relapse. Social supports were discussed and explored in detail. Post Acute Withdrawal Syndrome (handout provided) was introduced and examined. Pt's were encouraged to ask questions, talk about key points associated with PAWS, and process this information in terms of relapse prevention.   Smart, Brydon Spahr LCSWA 02/18/2015, 1:18 PM

## 2015-02-18 NOTE — Progress Notes (Signed)
NUTRITION ASSESSMENT  Pt identified as at risk on the Malnutrition Screen Tool  INTERVENTION: 1. Educated patient on the importance of nutrition and encouraged intake of food and beverages. 2. Discussed weight goals. 3. Supplements: will order Ensure Enlive po BID, each supplement provides 350 kcal and 20 grams of protein   NUTRITION DIAGNOSIS: Unintentional weight loss related to sub-optimal intake as evidenced by pt report.   Goal: Pt to meet >/= 90% of their estimated nutrition needs.  Monitor:  PO intake  Assessment:  Pt seen for Malnutrition Screening Tool. Pt anxious, upset, and crying this AM at time of visit. RN in room with pt prior to RD visit. Pt reports that she did not eat breakfast this AM and needs to drink. PTA pt's appetite was not good. 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 Unable to obtain further information from pt at this time. Will order Ensure Enlive to supplement. Per chart review, pt's weight fluctuates with most recent weight loss of 5 lb (2.5% body weight) in 2 days.  49 y.o. female  Height: Ht Readings from Last 1 Encounters:  02/18/15 5\' 9"  (1.753 m)    Weight: Wt Readings from Last 1 Encounters:  02/18/15 195 lb (88.451 kg)    Weight Hx: Wt Readings from Last 10 Encounters:  02/18/15 195 lb (88.451 kg)  02/17/15 196 lb (88.905 kg)  02/16/15 200 lb (90.719 kg)  01/28/15 200 lb (90.719 kg)  10/29/14 175 lb (79.379 kg)  10/25/14 175 lb (79.379 kg)  10/25/14 175 lb (79.379 kg)  10/15/14 180 lb (81.647 kg)  09/06/14 180 lb (81.647 kg)  08/30/14 180 lb (81.647 kg)    BMI:  Body mass index is 28.78 kg/(m^2). Pt meets criteria for overweight based on current BMI.  Estimated Nutritional Needs: Kcal: 25-30 kcal/kg Protein: > 1 gram protein/kg Fluid: 1 ml/kcal  Diet Order: Diet regular Room service appropriate?: Yes; Fluid consistency::  Thin Pt is also offered choice of unit snacks mid-morning and mid-afternoon.  Pt is eating as desired.   Lab results and medications reviewed.    Jarome Matin, RD, LDN Inpatient Clinical Dietitian Pager # [phone] After hours/weekend pager # [phone]

## 2015-02-18 NOTE — Tx Team (Signed)
Initial Interdisciplinary Treatment Plan   PATIENT STRESSORS: Health problems Medication change or noncompliance Substance abuse   PATIENT STRENGTHS: Curator fund of knowledge Religious Affiliation Supportive family/friends   PROBLEM LIST: Problem List/Patient Goals Date to be addressed Date deferred Reason deferred Estimated date of resolution  "to get help with alcohol" 02/17/15     depression 02/17/15     "a lot of medical stuff, turn me to drinking"  02/17/15     Suicidal ideations 02/17/15                                    DISCHARGE CRITERIA:  Ability to meet basic life and health needs Improved stabilization in mood, thinking, and/or behavior Medical problems require only outpatient monitoring Motivation to continue treatment in a less acute level of care Reduction of life-threatening or endangering symptoms to within safe limits Verbal commitment to aftercare and medication compliance Withdrawal symptoms are absent or subacute and managed without 24-hour nursing intervention  PRELIMINARY DISCHARGE PLAN: Attend aftercare/continuing care group Attend 12-step recovery group Outpatient therapy Participate in family therapy  PATIENT/FAMIILY INVOLVEMENT: This treatment plan has been presented to and reviewed with the patient, Jacqueline [name], and/or family member.  The patient and family have been given the opportunity to ask questions and make suggestions.  Zoe Lan 02/18/2015, 2:00 AM

## 2015-02-18 NOTE — Tx Team (Signed)
  Interdisciplinary Treatment Plan Update (Adult)  Date:  02/18/2015  Time Reviewed:  8:39 AM   Progress in Treatment: Attending groups: No. Participating in groups:  No. Taking medication as prescribed:  Yes. Tolerating medication:  Yes. Family/Significant othe contact made:  Not yet. SPE required for this pt.  Patient understands diagnosis:  Yes. and As evidenced by:  seeking treatment for ETOH detox, mood stabilization, and SI Discussing patient identified problems/goals with staff:  Yes. Medical problems stabilized or resolved:  Yes. Denies suicidal/homicidal ideation: No. Passive SI/able to contract for safety on the unit.  Issues/concerns per patient self-inventory:  Other:  Discharge Plan or Barriers: Pt did not attend discharge planning group this morning. CSW assessing for appropriate referrals.   Reason for Continuation of Hospitalization: Depression Medication stabilization Withdrawal symptoms  Comments:  Jacqueline [name] is an 49 y.o. female presented to [facility] while trying to stop drinking alcohol. Per chart review notes, pt was suicidal on admission however, pt denied SI in assessment. Pt reported she does not want to die, she just wanted to stop using alcohol. Pt reports that she drinks 6 to 8 oz of Listerine daily. Pt reports that her liver really hurts and she can't keep drinking. Pt denies all other substance abuse, stating that she has not used anything else for years. Pt denies HI and access to weapons. Pt states that she hears voices and sees things but that hasn't happened for the last week. Pt reports feeling depressed for years with symptoms including insomnia, depressed mood, lack of interest in things she used to care about, increased irritability, guilt, worthlessness, fatigue and tearfulness. Pt reports that she has lost everything due to alcohol and needs to stop drinking. Pt reports that she has manic episodes and a history of bipolar disorder symptoms. Pt has  been treated at [facility] multiple times in 2014 and 2015. Pt reports she sees a therapist and psychiatrist and has a [facility] team.   Estimated length of stay:  3-5 days   Additional Comments:  Patient and CSW reviewed pt's identified goals and treatment plan. Patient verbalized understanding and agreed to treatment plan. CSW reviewed [facility] "Discharge Process and Patient Involvement" Form. Pt verbalized understanding of information provided and signed form.   Attendees: Patient:   02/18/2015 8:39 AM   Family:   02/18/2015 8:39 AM   Physician:  Dr. Carlton Adam, MD 02/18/2015 8:39 AM   Nursing:   Durwin Nora RN 02/18/2015 8:39 AM   Clinical Social Worker: Maxie Better, [place]  02/18/2015 8:39 AM   Clinical Social Worker: Erasmo Downer Drinkard LCSWA 02/18/2015 8:39 AM   Other:  Gerline Legacy Nurse Case Manager 02/18/2015 8:39 AM   Other:  Lucinda Dell; [facility]  02/18/2015 8:39 AM   Other:   02/18/2015 8:39 AM   Other:  02/18/2015 8:39 AM   Other:  02/18/2015 8:39 AM   Other:  02/18/2015 8:39 AM    02/18/2015 8:39 AM    02/18/2015 8:39 AM    02/18/2015 8:39 AM    02/18/2015 8:39 AM    Scribe for Treatment Team:   Maxie Better, [place]  02/18/2015 8:39 AM

## 2015-02-18 NOTE — BHH Group Notes (Signed)
[facility] LCSW Aftercare Discharge Planning Group Note   02/18/2015 9:37 AM  Participation Quality:  Invited-DID NOT ATTEND. Pt sleeping in room.   Smart, [employer]

## 2015-02-18 NOTE — BHH Counselor (Signed)
Adult Comprehensive Assessment  Patient ID: Jacqueline [name], female   DOB: November 19, 1965, 49 y.o.   MRN: [id number]  Information Source: CSW attempted to meet with pt two times today (morning and afternoon) to complete PSA, review treatment plan, and discuss aftercare. Pt in bed sleeping both times and did not want to get up to talk. CSW assessing.   Smart, Zakkary Thibault LCSWA 02/18/2015 3:03 PM

## 2015-02-18 NOTE — Progress Notes (Signed)
Recreation Therapy Notes  Date: 02/18/15 Time: 9:30am Location: [address] Hall Group Room  Group Topic: Stress Management  Goal Area(s) Addresses:  Patient will actively participate in stress management techniques presented during session.   Intervention: Stress management techniques  Activity: Guided Imagery. LRT provided instruction and demonstration for Guided Imagery.   Education: Stress Management, Discharge Planning.   Clinical Observations/Feedback: Patient did not attend group.   Victorino Sparrow, LRT/CTRS         Victorino Sparrow A 02/18/2015 3:41 PM

## 2015-02-19 DIAGNOSIS — F10230 Alcohol dependence with withdrawal, uncomplicated: Secondary | ICD-10-CM

## 2015-02-19 MED ORDER — METRONIDAZOLE 500 MG PO TABS
500.0000 mg | ORAL_TABLET | Freq: Two times a day (BID) | ORAL | Status: DC
  Administered 2015-02-19 – 2015-02-23 (×8): 500 mg via ORAL
  Filled 2015-02-19 (×6): qty 1
  Filled 2015-02-19: qty 2
  Filled 2015-02-19 (×4): qty 1

## 2015-02-19 MED ORDER — IBUPROFEN 400 MG PO TABS: 400.0000 mg | ORAL_TABLET | Freq: Four times a day (QID) | ORAL | Status: DC | PRN

## 2015-02-19 MED ORDER — LITHIUM CARBONATE 150 MG PO CAPS
150.0000 mg | ORAL_CAPSULE | Freq: Two times a day (BID) | ORAL | Status: DC
  Administered 2015-02-19 – 2015-02-23 (×8): 150 mg via ORAL
  Filled 2015-02-19 (×13): qty 1

## 2015-02-19 MED ORDER — IBUPROFEN 400 MG PO TABS
400.0000 mg | ORAL_TABLET | Freq: Two times a day (BID) | ORAL | Status: DC | PRN
  Administered 2015-02-19 – 2015-02-20 (×4): 400 mg via ORAL
  Filled 2015-02-19 (×4): qty 1

## 2015-02-19 NOTE — Progress Notes (Signed)
Baseline EKG obtained per NP order. Results indicate abnormalities. No provider in building to evaluate. Will await cardiology's review. Patient asymptomatic. Jamie Kato
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
## 2015-02-19 NOTE — Plan of Care (Signed)
Problem: Alteration in mood & ability to function due to Goal: STG-Patient will comply with prescribed medication regimen (Patient will comply with prescribed medication regimen)  Outcome: Progressing Patient has been med compliant.  Problem: Alteration in mood Goal: STG-Patient reports thoughts of self-harm to staff Outcome: Progressing Patient denying SI. No thoughts to self harm.

## 2015-02-19 NOTE — Progress Notes (Signed)
D.  Pt has reported not feeling well tonight, took medication and went to bed relatively early.  Interacting appropriately with peers on the unit.  Denies SI/HI/hallucinations at this time.  A.  Support and encouragement offered, medication given as ordered.  R.  Pt remains safe on unit, will continue to monitor.

## 2015-02-19 NOTE — Progress Notes (Signed)
[name], [place] MD Progress Note  02/19/2015 2:51 PM KMARI Jacqueline  MRN:  [id number] Subjective:   Jacqueline [name] is an 49 y.o. female presented to [place] while trying to stop drinking alcohol.  Pt reported she does not want to die, she just wanted to stop using alcohol. Pt reports that she drinks 6 to 8 oz of Listerine daily. Pt reports that she is still having R upper quadrant/flank abdominal pain and she attributes the pain to possible liver involvement.  Pt denies all other substance abuse, stating that she has not used anything else for years.   Pt reports feeling depressed.  She has had feelings of  insomnia, depressed mood, lack of interest in things she used to care about, increased irritability, guilt, worthlessness, fatigue and tearfulness.  She does state that she has improved on some of these from when she was first admitted a couple of days previous.  Taron has been on Lithium and now on Tegretol.  She is having a difficult time affording her meds.     Principal Problem: Alcohol dependence with withdrawal, uncomplicated Diagnosis:   Patient Active Problem List   Diagnosis Date Noted  . Bipolar disorder, current episode depressed, severe, without psychotic features [F31.4] 02/17/2015  . Alcoholism [F10.20]   . Alcohol dependence with withdrawal, uncomplicated [X90.240] 97/35/3299  . Intentional ibuprofen overdose [T39.312A] 10/17/2014  . Severe recurrent major depression without psychotic features [F33.2] 10/17/2014  . Substance induced mood disorder [F19.94] 10/17/2014  . Overdose [T50.901A]   . Suicidal ideation [R45.851]   . 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 PTSD (post-traumatic stress disorder) [F43.10] 08/03/2013  . Unspecified episodic mood disorder [F39] 05/14/2013  . Hallucinations [R44.3] 04/14/2013  . Anastomotic ulcer, acute [K28.3] 03/23/2013  . Melena [K92.1] 03/21/2013  . Abnormal liver enzymes  [R74.8] 12/18/2011  . Cocaine abuse, episodic [F14.10] 12/18/2011    Class: Acute  . PUD (peptic ulcer disease) [K27.9] 12/18/2011  . Anxiety disorder [F41.9] 06/19/2011  . Bacterial vaginosis [N76.0, A49.9] 06/19/2011  . Anemia [D64.9] 06/17/2011  . Thrombocytopenia [D69.6] 06/17/2011  . UTI (urinary tract infection) [N39.0] 06/16/2011  . Hypothyroidism [E03.9] 06/12/2011  . Polysubstance abuse [F19.10] 06/12/2011   Total Time spent with patient: 30 minutes   Past Medical History:  Past Medical History  Diagnosis Date  . Depression   . Peptic ulcer   . Alcohol abuse   . Anemia   . Benzodiazepine abuse   . Thyroid disease   . Alcoholism   . Narcotic abuse   . Back pain   . Thrombocytopenia 06/17/2011  . Hypothyroidism   . Seizures   . Hypoglycemia   . Suicide attempt     multiple times    Past Surgical History  Procedure Laterality Date  . Cholecystectomy    . Abdominal surgery    . Esophagogastroduodenoscopy    . Gastric bypass    . Tubal ligation    . Esophagogastroduodenoscopy N/A 03/23/2013    Procedure: ESOPHAGOGASTRODUODENOSCOPY (EGD);  Surgeon: Ladene Artist, MD;  Location: [facility];  Service: Endoscopy;  Laterality: N/A;   Family History:  Family History  Problem Relation Age of Onset  . Alcohol abuse Father   . Alcoholism Father   . Cancer Other    Social History:  History  Alcohol Use  . Yes    Comment: drink 4 liters a day. 1 case/day drinks Listerine     History  Drug Use  . Yes  .  Special: Oxycodone, Cocaine, Benzodiazepines    Comment: benzo    History   Social History  . Marital Status: Single    Spouse Name: N/A  . Number of Children: N/A  . Years of Education: N/A   Social History Main Topics  . Smoking status: Current Every Day Smoker -- 1.00 packs/day for 12 years    Types: Cigarettes    Last Attempt to Quit: 08/08/2011  . Smokeless tobacco: Never Used  . Alcohol Use: Yes     Comment: drink 4 liters a day. 1 case/day  drinks Listerine  . Drug Use: Yes    Special: Oxycodone, Cocaine, Benzodiazepines     Comment: benzo  . Sexual Activity: Yes    Birth Control/ Protection: Surgical   Other Topics Concern  . None   Social History Narrative   ** Merged History Encounter **       Additional History:    Sleep: Fair  Appetite:  Fair   Assessment:   Musculoskeletal: Strength & Muscle Tone: within normal limits Gait & Station: normal Patient leans: N/A   Psychiatric Specialty Exam: Physical Exam  Vitals reviewed.   Review of Systems  Gastrointestinal: Positive for constipation.  Psychiatric/Behavioral: Positive for depression. The patient is nervous/anxious.   All other systems reviewed and are negative.   Blood pressure 106/74, pulse 76, temperature 99.1 F (37.3 C), temperature source Oral, resp. rate 17, height 5\' 9"  (1.753 m), weight 88.451 kg (195 lb), last menstrual period 02/13/2015.Body mass index is 28.78 kg/(m^2).   General Appearance: Disheveled  Eye Contact:: Minimal  Speech: Clear and Coherent and Slurred  Volume: Decreased  Mood: Anxious and Depressed  Affect: anxious worried depressed  Thought Process: Coherent and Goal Directed  Orientation: Full (Time, Place, and Person)  Thought Content: symptoms events worries concerns  Suicidal Thoughts: No  Homicidal Thoughts: No  Memory: Immediate; Fair Recent; Fair Remote; Fair  Judgement: Fair  Insight: Present and Shallow  Psychomotor Activity: Restlessness  Concentration: Fair  Recall: [employer] of Knowledge:Fair  Language: Fair  Akathisia: No  Handed: Right  AIMS (if indicated):    Assets: Financial Resources/Insurance  ADL's: Intact  Cognition: WNL  Sleep: Number of Hours: 3.5        Current Medications: Current Facility-Administered Medications  Medication Dose Route Frequency Provider Last Rate Last Dose  . acetaminophen (TYLENOL) tablet 650 mg   650 mg Oral Q6H PRN Harriet Butte, NP   650 mg at 02/19/15 0942  . alum & mag hydroxide-simeth (MAALOX/MYLANTA) 200-200-20 MG/5ML suspension 30 mL  30 mL Oral Q4H PRN Harriet Butte, NP      . carbamazepine (TEGRETOL XR) 12 hr tablet 300 mg  300 mg Oral BID Harriet Butte, NP   300 mg at 02/19/15 0825  . feeding supplement (ENSURE ENLIVE) (ENSURE ENLIVE) liquid 237 mL  237 mL Oral BID BM Maricela Bo [place], RD   237 mL at 02/19/15 1439  . FLUoxetine (PROZAC) capsule 60 mg  60 mg Oral Daily Harriet Butte, NP   60 mg at 02/19/15 0825  . folic acid (FOLVITE) tablet 1 mg  1 mg Oral Daily Harriet Butte, NP   1 mg at 02/19/15 0825  . gabapentin (NEURONTIN) tablet 800 mg  800 mg Oral TID Harriet Butte, NP   800 mg at 02/19/15 1147  . hydrOXYzine (ATARAX/VISTARIL) tablet 25 mg  25 mg Oral Q6H PRN Harriet Butte, NP   25 mg at 02/18/15  1538  . levothyroxine (SYNTHROID, LEVOTHROID) tablet 125 mcg  125 mcg Oral QAC breakfast Harriet Butte, NP   125 mcg at 02/19/15 0605  . loperamide (IMODIUM) capsule 2-4 mg  2-4 mg Oral PRN Harriet Butte, NP      . LORazepam (ATIVAN) tablet 1 mg  1 mg Oral Q6H PRN Nicholaus Bloom, MD   1 mg at 02/19/15 0605  . LORazepam (ATIVAN) tablet 1 mg  1 mg Oral TID Nicholaus Bloom, MD   1 mg at 02/19/15 1148   Followed by  . [START ON 02/20/2015] LORazepam (ATIVAN) tablet 1 mg  1 mg Oral BID Nicholaus Bloom, MD       Followed by  . [START ON 02/21/2015] LORazepam (ATIVAN) tablet 1 mg  1 mg Oral Daily Nicholaus Bloom, MD      . magnesium hydroxide (MILK OF MAGNESIA) suspension 30 mL  30 mL Oral Daily PRN Harriet Butte, NP      . metroNIDAZOLE (FLAGYL) tablet 500 mg  500 mg Oral Q12H Kerrie Buffalo, NP      . multivitamin with minerals tablet 1 tablet  1 tablet Oral Daily Harriet Butte, NP   1 tablet at 02/19/15 0825  . nicotine (NICODERM CQ - dosed in mg/24 hours) patch 21 mg  21 mg Transdermal Daily Nicholaus Bloom, MD   21 mg at 02/19/15 0829  . ondansetron (ZOFRAN-ODT)  disintegrating tablet 4 mg  4 mg Oral Q6H PRN Harriet Butte, NP   4 mg at 02/18/15 1628  . thiamine (B-1) injection 100 mg  100 mg Intramuscular Once Nicholaus Bloom, MD   100 mg at 02/18/15 0912  . thiamine (VITAMIN B-1) tablet 100 mg  100 mg Oral Daily Harriet Butte, NP   100 mg at 02/19/15 0825  . traZODone (DESYREL) tablet 50 mg  50 mg Oral QHS PRN Harriet Butte, NP   50 mg at 02/18/15 2128    Lab Results: No results found for this or any previous visit (from the past 48 hour(s)).  Physical Findings: AIMS: Facial and Oral Movements Muscles of Facial Expression: None, normal Lips and Perioral Area: None, normal Jaw: None, normal Tongue: None, normal,Extremity Movements Upper (arms, wrists, hands, fingers): None, normal Lower (legs, knees, ankles, toes): None, normal, Trunk Movements Neck, shoulders, hips: None, normal, Overall Severity Severity of abnormal movements (highest score from questions above): None, normal Incapacitation due to abnormal movements: None, normal Patient's awareness of abnormal movements (rate only patient's report): No Awareness, Dental Status Current problems with teeth and/or dentures?: No Does patient usually wear dentures?: No  CIWA:  CIWA-Ar Total: 10 COWS:     Treatment Plan Summary: Admit for crisis management and mood stabilization. Medication management to re-stabilize current mood symptoms - Added Ibuprofen 400 Q6 prn pain, fever.  Flagyl 500 mg BID bact vaginosis.  Discont. Tegtretol.  Started on Lithium 150 mg BID.   Group counseling sessions for coping skills Medical consults as needed Review and reinstate any pertinent home medications for other health problems  Medical Decision Making:  Review of Psycho-Social Stressors (1), Discuss test with performing physician (1), Decision to obtain old records (1), Review and summation of old records (2), Review of Medication Regimen & Side Effects (2) and Review of New Medication or Change in Dosage  (2)  Aris Even May [place] AGNP-BC 02/19/2015, 2:51 PM

## 2015-02-19 NOTE — [facility] Group Notes (Signed)
[facility] Group Notes:  (Nursing/MHT/Case Management/Adjunct)  Date:  02/19/2015  Time:  0900  Type of Therapy:  Nurse Education  Participation Level:  Active  Participation Quality:  Attentive  Affect:  Anxious  Cognitive:  Alert  Insight:  Improving  Engagement in Group:  Engaged  Modes of Intervention:  Discussion, Education and Support  Summary of Progress/Problems: Patient participated in group stating her goal for today is to feel better physically. States her coping skill is to rely on daughter for emotional support.  Loletta Specter [facility] 02/19/2015, 0930

## 2015-02-19 NOTE — Progress Notes (Addendum)
Patient continues to complain of severe withdrawal. CIWA an "11" this morning (0600) and a "10" at lunch time. VS remain stable. She rates her depression and hopelessness both at a 10/10 and anxiety at an 8/10. States this detox has been the hardest. "They get harder and harder each time. I guess with my age. And my liver is sore." Medicated per orders. Patient given reassurance. Encouraged to commit to sobriety this time given her statements, concerns. Patient somewhat receptive. While she denies SI/HI on the unit, she states, "if I go home and drink, I may take an overdose instead of drinking." No HI/AVH. Will continue to monitor closely.   Jamie Kato

## 2015-02-19 NOTE — [facility] Group Notes (Signed)
[place] Group Notes:  (Clinical Social Work)  02/19/2015     10-11AM  Summary of Progress/Problems: The main focus of today's process group was to learn how to use a decisional balance exercise to move forward. Motivational Interviewing was utilized to help patients explore in depth the perceived benefits and costs of unhealthy coping techniques, as well as the benefits and costs of replacing that with a healthy coping skills. The patient expressed that alcohol, suicidal ideation and isolation are her main unhealthy coping mechanisms.  She did not stay in the group, left after about 20 minutes.  Type of Therapy:  Group Therapy - Process   Participation Level:  Minimal  Participation Quality:  Sick  Affect:  Flat  Cognitive:  Oriented  Insight:  Limited  Engagement in Therapy:  Limited  Modes of Intervention:  Education, Motivational Interviewing  Selmer Dominion, LCSW 02/19/2015, 12:33 PM

## 2015-02-20 DIAGNOSIS — F329 Major depressive disorder, single episode, unspecified: Secondary | ICD-10-CM

## 2015-02-20 MED ORDER — IBUPROFEN 400 MG PO TABS
400.0000 mg | ORAL_TABLET | Freq: Three times a day (TID) | ORAL | Status: DC | PRN
  Administered 2015-02-20 – 2015-02-23 (×5): 400 mg via ORAL
  Filled 2015-02-20 (×5): qty 1

## 2015-02-20 MED ORDER — PANTOPRAZOLE SODIUM 40 MG PO TBEC
40.0000 mg | DELAYED_RELEASE_TABLET | Freq: Two times a day (BID) | ORAL | Status: DC
  Administered 2015-02-20 – 2015-02-23 (×6): 40 mg via ORAL
  Filled 2015-02-20 (×8): qty 1

## 2015-02-20 NOTE — Progress Notes (Signed)
Patient did attend the evening speaker AA meeting.  

## 2015-02-20 NOTE — [facility] Group Notes (Signed)
[place] Group Notes:  (Nursing/MHT/Case Management/Adjunct)  Date:  02/20/2015  Time:  0900am  Type of Therapy:  Nurse Education  Participation Level:  Active  Participation Quality:  Appropriate and Attentive  Affect:  Blunted  Cognitive:  Alert and Appropriate  Insight:  Appropriate  Engagement in Group:  Engaged  Modes of Intervention:  Discussion, Education and Support  Summary of Progress/Problems: Patient attended group, remained engaged, and responded appropriately when prompted.   Charlyne Quale A 02/20/2015, 10:42 AM

## 2015-02-20 NOTE — Plan of Care (Signed)
Problem: Alteration in mood & ability to function due to Goal: LTG-Pt reports reduction in suicidal thoughts (Patient reports reduction in suicidal thoughts and is able to verbalize a safety plan for whenever patient is feeling suicidal)  Outcome: Not Progressing Patient reports to RN this morning that she would "rather just take a bottle of pills." Pt is able to verbally contract for safety. Goal: LTG-Patient demonstrates decreased signs of withdrawal Pt reporting moderate/severe withdrawals today with CIWA score of 8 and stable vitals. By d/c, pt will complete detox protocol and report no signs of withdrawal with CIWA score of 0. Goal progressing.  Maxie Better, LCSWA Clinical Social Worker 02/18/2015 11:31 AM  Outcome: Not Progressing Patient continues to endorse signs of withdrawal symptoms including auditory hallucinations, tremors, nausea, and anxiety.  Problem: Ineffective individual coping Goal: STG: Patient will remain free from self harm Outcome: Progressing Patient remains free from self harm. 15 minute checks continued per protocol for patient safety.

## 2015-02-20 NOTE — Progress Notes (Signed)
D.  Pt pleasant on approach, complaint of continued withdrawal and liver pain.  Positive for evening AA group, interacting appropriately with peers on the unit.  Denies SI/HI/hallucinations at this time, but did report some passive SI to OD on a "bottle of pills" as well as visual hallucinations to dayshift.  A.  Support and encouragement offered, medication given as ordered.  R.  Pt remains safe on the unit, will continue to monitor.

## 2015-02-20 NOTE — Progress Notes (Signed)
Patient ID: Jacqueline [name], female   DOB: 1966-08-28, 49 y.o.   MRN: [id number] [facility] MD Progress Note  02/20/2015 1:24 PM NERA HAWORTH  MRN:  [id number] Subjective:   Patient states "I am really having a hard time. My right side keeps hurting me. I know drinking Listerine is not good. I never meant to become an alcoholic. It started off having gastric bypass eight years ago and I found I got high faster. I want to stop. But I drink to ease the pain in my liver and that keeps my cycle going."   Objective:  Patient is seen and chart is reviewed. The patient appears distressed during the assessment. She points to the location of her liver and reports a dull pain. Patient becomes tearful when discussing her struggles with alcohol abuse. Reports that occasional motrin helps with her RUQ pain and is upset that she has not been receiving it. Also requested that her Protonix be reordered. Spoke with Einar Grad Pharm D who agreed that it would be safe to give motrin at low dosages with a medication such as Protonix twice daily. Patient reports some improvement in withdraw symptoms stating "I am not vomiting as much and can walk better." She reports drinking alcohol heavily since the start of this year to include Listerine and "whatever alcohol I can get a hold of." Her liver enzymes are noted to be elevated but much less than in April of 2016.   Principal Problem: Alcohol dependence with withdrawal, uncomplicated Diagnosis:   Patient Active Problem List   Diagnosis Date Noted  . Bipolar disorder, current episode depressed, severe, without psychotic features [F31.4] 02/17/2015  . Alcoholism [F10.20]   . Alcohol dependence with withdrawal, uncomplicated [Z66.294] 76/54/6503  . Intentional ibuprofen overdose [T39.312A] 10/17/2014  . Severe recurrent major depression without psychotic features [F33.2] 10/17/2014  . Substance induced mood disorder [F19.94] 10/17/2014  . Overdose [T50.901A]   . Suicidal  ideation [R45.851]   . 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 PTSD (post-traumatic stress disorder) [F43.10] 08/03/2013  . Unspecified episodic mood disorder [F39] 05/14/2013  . Hallucinations [R44.3] 04/14/2013  . Anastomotic ulcer, acute [K28.3] 03/23/2013  . Melena [K92.1] 03/21/2013  . Abnormal liver enzymes [R74.8] 12/18/2011  . Cocaine abuse, episodic [F14.10] 12/18/2011    Class: Acute  . PUD (peptic ulcer disease) [K27.9] 12/18/2011  . Anxiety disorder [F41.9] 06/19/2011  . Bacterial vaginosis [N76.0, A49.9] 06/19/2011  . Anemia [D64.9] 06/17/2011  . Thrombocytopenia [D69.6] 06/17/2011  . UTI (urinary tract infection) [N39.0] 06/16/2011  . Hypothyroidism [E03.9] 06/12/2011  . Polysubstance abuse [F19.10] 06/12/2011   Total Time spent with patient: 20 minutes  Past Medical History:  Past Medical History  Diagnosis Date  . Depression   . Peptic ulcer   . Alcohol abuse   . Anemia   . Benzodiazepine abuse   . Thyroid disease   . Alcoholism   . Narcotic abuse   . Back pain   . Thrombocytopenia 06/17/2011  . Hypothyroidism   . Seizures   . Hypoglycemia   . Suicide attempt     multiple times    Past Surgical History  Procedure Laterality Date  . Cholecystectomy    . Abdominal surgery    . Esophagogastroduodenoscopy    . Gastric bypass    . Tubal ligation    . Esophagogastroduodenoscopy N/A 03/23/2013    Procedure: ESOPHAGOGASTRODUODENOSCOPY (EGD);  Surgeon: Ladene Artist, MD;  Location: [facility];  Service: Endoscopy;  Laterality: N/A;   Family History:  Family History  Problem Relation Age of Onset  . Alcohol abuse Father   . Alcoholism Father   . Cancer Other    Social History:  History  Alcohol Use  . Yes    Comment: drink 4 liters a day. 1 case/day drinks Listerine     History  Drug Use  . Yes  . Special: Oxycodone, Cocaine, Benzodiazepines    Comment: benzo    History   Social  History  . Marital Status: Single    Spouse Name: N/A  . Number of Children: N/A  . Years of Education: N/A   Social History Main Topics  . Smoking status: Current Every Day Smoker -- 1.00 packs/day for 12 years    Types: Cigarettes    Last Attempt to Quit: 08/08/2011  . Smokeless tobacco: Never Used  . Alcohol Use: Yes     Comment: drink 4 liters a day. 1 case/day drinks Listerine  . Drug Use: Yes    Special: Oxycodone, Cocaine, Benzodiazepines     Comment: benzo  . Sexual Activity: Yes    Birth Control/ Protection: Surgical   Other Topics Concern  . None   Social History Narrative   ** Merged History Encounter **       Additional History:    Sleep: Fair  Appetite:  Fair  Assessment:  Patient is a 49 year old female with a long history of alcohol dependence requesting detox.   Musculoskeletal: Strength & Muscle Tone: within normal limits Gait & Station: normal Patient leans: N/A  Psychiatric Specialty Exam: Physical Exam  Vitals reviewed.   Review of Systems  Constitutional: Positive for malaise/fatigue.  HENT: Negative.   Eyes: Negative.   Respiratory: Negative.   Cardiovascular: Negative.   Gastrointestinal: Positive for heartburn, nausea, vomiting and abdominal pain.  Genitourinary: Negative.   Musculoskeletal: Negative.   Skin: Negative.   Neurological: Negative.   Endo/Heme/Allergies: Negative.   Psychiatric/Behavioral: Positive for depression and substance abuse. Negative for suicidal ideas, hallucinations and memory loss. The patient is nervous/anxious and has insomnia.     Blood pressure 126/69, pulse 73, temperature 98.9 F (37.2 C), temperature source Oral, resp. rate 20, height 5\' 9"  (1.753 m), weight 88.451 kg (195 lb), last menstrual period 02/13/2015.Body mass index is 28.78 kg/(m^2).   General Appearance: Disheveled  Eye Contact:: Minimal  Speech: Clear and Coherent  Volume: Normal   Mood: Anxious and Depressed  Affect:  Congruent   Thought Process: Coherent and Goal Directed  Orientation: Full (Time, Place, and Person)  Thought Content: symptoms events worries concerns  Suicidal Thoughts: No  Homicidal Thoughts: No  Memory: Immediate; Fair Recent; Fair Remote; Fair  Judgement: Fair  Insight: Present and Shallow  Psychomotor Activity: Restlessness  Concentration: Fair  Recall: [employer] of Knowledge:Fair  Language: Fair  Akathisia: No  Handed: Right  AIMS (if indicated):    Assets: Financial Resources/Insurance, Communication skills  ADL's: Intact  Cognition: WNL  Sleep: Number of Hours: 3.5        Current Medications: Current Facility-Administered Medications  Medication Dose Route Frequency Provider Last Rate Last Dose  . acetaminophen (TYLENOL) tablet 650 mg  650 mg Oral Q6H PRN Harriet Butte, NP   650 mg at 02/19/15 0942  . alum & mag hydroxide-simeth (MAALOX/MYLANTA) 200-200-20 MG/5ML suspension 30 mL  30 mL Oral Q4H PRN Harriet Butte, NP      . carbamazepine (TEGRETOL XR) 12 hr  tablet 300 mg  300 mg Oral BID Harriet Butte, NP   300 mg at 02/20/15 0751  . feeding supplement (ENSURE ENLIVE) (ENSURE ENLIVE) liquid 237 mL  237 mL Oral BID BM Maricela Bo [place], RD   237 mL at 02/20/15 0935  . FLUoxetine (PROZAC) capsule 60 mg  60 mg Oral Daily Harriet Butte, NP   60 mg at 02/20/15 0751  . folic acid (FOLVITE) tablet 1 mg  1 mg Oral Daily Harriet Butte, NP   1 mg at 02/20/15 0752  . gabapentin (NEURONTIN) tablet 800 mg  800 mg Oral TID Harriet Butte, NP   800 mg at 02/20/15 1153  . hydrOXYzine (ATARAX/VISTARIL) tablet 25 mg  25 mg Oral Q6H PRN Harriet Butte, NP   25 mg at 02/18/15 1538  . ibuprofen (ADVIL,MOTRIN) tablet 400 mg  400 mg Oral BID PRN Kerrie Buffalo, NP   400 mg at 02/20/15 0400  . levothyroxine (SYNTHROID, LEVOTHROID) tablet 125 mcg  125 mcg Oral QAC breakfast Harriet Butte, NP   125 mcg at 02/20/15 0540  . lithium  carbonate capsule 150 mg  150 mg Oral BID WC Kerrie Buffalo, NP   150 mg at 02/20/15 0752  . loperamide (IMODIUM) capsule 2-4 mg  2-4 mg Oral PRN Harriet Butte, NP      . LORazepam (ATIVAN) tablet 1 mg  1 mg Oral Q6H PRN Nicholaus Bloom, MD   1 mg at 02/20/15 0400  . LORazepam (ATIVAN) tablet 1 mg  1 mg Oral BID Nicholaus Bloom, MD   1 mg at 02/20/15 0258   Followed by  . [START ON 02/21/2015] LORazepam (ATIVAN) tablet 1 mg  1 mg Oral Daily Nicholaus Bloom, MD      . magnesium hydroxide (MILK OF MAGNESIA) suspension 30 mL  30 mL Oral Daily PRN Harriet Butte, NP      . metroNIDAZOLE (FLAGYL) tablet 500 mg  500 mg Oral Q12H Kerrie Buffalo, NP   500 mg at 02/20/15 0752  . multivitamin with minerals tablet 1 tablet  1 tablet Oral Daily Harriet Butte, NP   1 tablet at 02/19/15 0825  . nicotine (NICODERM CQ - dosed in mg/24 hours) patch 21 mg  21 mg Transdermal Daily Nicholaus Bloom, MD   21 mg at 02/20/15 [phone]  . ondansetron (ZOFRAN-ODT) disintegrating tablet 4 mg  4 mg Oral Q6H PRN Harriet Butte, NP   4 mg at 02/20/15 0400  . pantoprazole (PROTONIX) EC tablet 40 mg  40 mg Oral BID Niel Hummer, NP      . thiamine (B-1) injection 100 mg  100 mg Intramuscular Once Nicholaus Bloom, MD   100 mg at 02/18/15 0912  . thiamine (VITAMIN B-1) tablet 100 mg  100 mg Oral Daily Harriet Butte, NP   100 mg at 02/20/15 0752  . traZODone (DESYREL) tablet 50 mg  50 mg Oral QHS PRN Harriet Butte, NP   50 mg at 02/18/15 2128    Lab Results: No results found for this or any previous visit (from the past 48 hour(s)).  Physical Findings: AIMS: Facial and Oral Movements Muscles of Facial Expression: None, normal Lips and Perioral Area: None, normal Jaw: None, normal Tongue: None, normal,Extremity Movements Upper (arms, wrists, hands, fingers): None, normal Lower (legs, knees, ankles, toes): None, normal, Trunk Movements Neck, shoulders, hips: None, normal, Overall Severity Severity of abnormal movements (highest  score from  questions above): None, normal Incapacitation due to abnormal movements: None, normal Patient's awareness of abnormal movements (rate only patient's report): No Awareness, Dental Status Current problems with teeth and/or dentures?: No Does patient usually wear dentures?: No  CIWA:  CIWA-Ar Total: 6 COWS:     Treatment Plan Summary: Admit for crisis management and mood stabilization. Medication management to re-stabilize current mood symptoms  -Continue Ativan taper for alcohol detox  -Continue Prozac 60 mg daily for depression -Continue Lithium 150 mg BID for improved mood stability -Continue Neurontin 800 mg TID for agitation  Group counseling sessions for coping skills Medical consults as needed Review and reinstate any pertinent home medications for other health problems such as Protonix 40 mg BID for gastric ulcer. Order complete metabolic panel to reevaluate potassium level and liver enzymes.   Medical Decision Making:  Established Problem, Stable/Improving (1), Review of Psycho-Social Stressors (1), Review of Medication Regimen & Side Effects (2) and Review of New Medication or Change in Dosage (2)  Trinity Haun NP-C 02/20/2015, 1:24 PM

## 2015-02-20 NOTE — [facility] Group Notes (Signed)
[facility] Group Notes:  (Clinical Social Work)  02/20/2015  10:00-11:00AM  Summary of Progress/Problems:   The main focus of today's process group was to   1)  discuss the importance of adding supports  2)  define health supports versus unhealthy supports  3)  identify the patient's current unhealthy supports and plan how to handle them  4)  Identify the patient's current healthy supports and plan what to add.  An emphasis was placed on using counselor, doctor, therapy groups, 12-step groups, and problem-specific support groups to expand supports.    The patient expressed full comprehension of the concepts presented, and agreed that there is a need to add more supports.  The patient refused to speak during group, was obviously not feeling well and eventually left.  Type of Therapy:  Process Group with Motivational Interviewing  Participation Level:  Minimal  Participation Quality:  Attentive  Affect:  Flat and sick  Cognitive:  Oriented  Insight:  Developing/Improving  Engagement in Therapy:  Limited  Modes of Intervention:   Education, Support and Processing, Activity  Selmer Dominion, LCSW 02/20/2015

## 2015-02-20 NOTE — Progress Notes (Signed)
D: Patient is alert and oriented. Pt's mood and affect is depressed and anxious. Pt is tearful at times. Pt denies HI and VH. Pt reports suicidal thoughts with plan, pt states "I'd rather just take a bottle of pills." Pt reports AH of "voices" that command her to do things. Pt reports illusion this afternoon that she thought "there was a worm on the floor, but it was actually a pretzel." Pt states "I just feel like I'm crazy, I just want to get better, I don't want to feel like this." Pt rates depression and hopelessness 10/10, anxiety 9/10. Pt states her goal for the day is "to feel better." Pt complains of right upper quadrant, "liver" pain 7/10. Pt is attending unit groups. Pt is requesting protonix and reports hx of "ulcers." Pt complains of nausea this afternoon which decreased with PRN medication. Pt frequently asks RN about medications she can have, pt states "I'm not med seeking." A: Cold pack given for pain, will offer PRN as order parameters allow, NP Clarene Duke. Made aware of pt's concerns/requests for pain control, NP Mickel Baas made aware of allergy list and pt's elevated liver enzymes. Withdrawal symptoms/education reviewed with pt. Encouragement/Support provided to pt. Active listening by RN. Medication education reviewed with pt. PRN medication administered for pain and nausea per providers orders (See MAR). Scheduled medications administered per providers orders (See MAR). 15 minute checks continued per protocol for patient safety.  R: Pt verbally contracts for safety, agrees not to harm self and agrees to come to staff with increased intensity in suicidal thoughts. Patient cooperative and receptive to nursing interventions. Pt remains safe.

## 2015-02-21 DIAGNOSIS — G4700 Insomnia, unspecified: Secondary | ICD-10-CM

## 2015-02-21 LAB — COMPREHENSIVE METABOLIC PANEL
ALK PHOS: 81 U/L (ref 38–126)
ALT: 51 U/L (ref 14–54)
AST: 46 U/L — ABNORMAL HIGH (ref 15–41)
Albumin: 3.8 g/dL (ref 3.5–5.0)
Anion gap: 9 (ref 5–15)
BILIRUBIN TOTAL: 0.5 mg/dL (ref 0.3–1.2)
BUN: 20 mg/dL (ref 6–20)
CHLORIDE: 100 mmol/L — AB (ref 101–111)
CO2: 27 mmol/L (ref 22–32)
Calcium: 8.4 mg/dL — ABNORMAL LOW (ref 8.9–10.3)
Creatinine, Ser: 0.8 mg/dL (ref 0.44–1.00)
GLUCOSE: 94 mg/dL (ref 65–99)
POTASSIUM: 4.3 mmol/L (ref 3.5–5.1)
Sodium: 136 mmol/L (ref 135–145)
TOTAL PROTEIN: 6.6 g/dL (ref 6.5–8.1)

## 2015-02-21 LAB — TSH: TSH: 23.607 u[IU]/mL — AB (ref 0.350–4.500)

## 2015-02-21 MED ORDER — AMITRIPTYLINE HCL 100 MG PO TABS
100.0000 mg | ORAL_TABLET | Freq: Every day | ORAL | Status: DC
  Administered 2015-02-21 – 2015-02-22 (×2): 100 mg via ORAL
  Filled 2015-02-21 (×3): qty 1

## 2015-02-21 MED ORDER — LORAZEPAM 1 MG PO TABS
1.0000 mg | ORAL_TABLET | Freq: Three times a day (TID) | ORAL | Status: DC | PRN
  Administered 2015-02-21 – 2015-02-23 (×6): 1 mg via ORAL
  Filled 2015-02-21 (×6): qty 1

## 2015-02-21 MED ORDER — LACTULOSE 10 GM/15ML PO SOLN
20.0000 g | Freq: Two times a day (BID) | ORAL | Status: DC | PRN
  Administered 2015-02-22: 20 g via ORAL
  Filled 2015-02-21 (×3): qty 30

## 2015-02-21 NOTE — [facility] Group Notes (Signed)
[facility] LCSW Group Therapy  02/21/2015 1:11 PM  Type of Therapy:  Group Therapy  Participation Level:  Active  Participation Quality:  Attentive  Affect:  Depressed  Cognitive:  Alert  Insight:  Improving  Engagement in Therapy:  Improving  Modes of Intervention:  Confrontation, Discussion, Education, Exploration, Problem-solving, Rapport Building, Socialization and Support  Summary of Progress/Problems: Today's Topic: Overcoming Obstacles. Patients identified one short term goal and potential obstacles in reaching this goal. Patients processed barriers involved in overcoming these obstacles. Patients identified steps necessary for overcoming these obstacles and explored motivation (internal and external) for facing these difficulties head on.  Jacqueline [name] was attentive and engaged during today's processing group. She shared that her biggest obstacle involves "affording my medication." She reports that the past few relapses that she's experiences were due to her inability to afford mental health medication. "I self medicate with alcohol because that's all I can afford." Pt shows progress in the group setting and improving insight AEB her ability to acknowledge the importance of speaking with the MD about being put on affordable meds. CSW educated pt regarding the orange card and pt was open to getting appt set up with financial counselor at d/c.   Smart, Chaselyn Nanney [place]  02/21/2015, 1:11 PM

## 2015-02-21 NOTE — Progress Notes (Signed)
D: Pt's mood is depressed. She rates her depression, anxiety, and hopelessness 10/10. Endorses passive SI but contracts for safety.  A: Support given. Verbalization encouraged. Pt encouraged to come to staff for any concerns. Medications given as prescribed. R: Pt is receptive. No complaints of pain or discomfort at this time. Q15 min safety checks maintained. Pt remains safe on the unit. Will continue to monitor.

## 2015-02-21 NOTE — Progress Notes (Signed)
[facility] MD Progress Note  02/21/2015 3:33 PM Jacqueline [name]  MRN:  [id number] Subjective:  Jacqueline [name] continues to have a hard time. She is still experiencing a lot of agitation. She is having some perceptual changes ,images, illusions. She is concerned about where to go from here. She states that if she was to relapse she will kill herself. States she does not see things getting any better. She states she has tried to work and she has taken off running from the work site. Cant explain why. She states that alcohol helps relief some of there anxiety, agitation.  States she has a lot of worries.  Principal Problem: Alcohol dependence with withdrawal, uncomplicated Diagnosis:   Patient Active Problem List   Diagnosis Date Noted  . Bipolar disorder, current episode depressed, severe, without psychotic features [F31.4] 02/17/2015    Priority: High  . Alcohol dependence with withdrawal, uncomplicated [T14.388] 87/57/9728    Priority: High  . PTSD (post-traumatic stress disorder) [F43.10] 08/03/2013    Priority: High  . Unspecified episodic mood disorder [F39] 05/14/2013    Priority: High  . Cocaine abuse, episodic [F14.10] 12/18/2011    Priority: High    Class: Acute  . Anxiety disorder [F41.9] 06/19/2011    Priority: High  . Alcoholism [F10.20]   . Intentional ibuprofen overdose [T39.312A] 10/17/2014  . Severe recurrent major depression without psychotic features [F33.2] 10/17/2014  . Substance induced mood disorder [F19.94] 10/17/2014  . Overdose [T50.901A]   . Suicidal ideation [R45.851]   . 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 Hallucinations [R44.3] 04/14/2013  . Anastomotic ulcer, acute [K28.3] 03/23/2013  . Melena [K92.1] 03/21/2013  . Abnormal liver enzymes [R74.8] 12/18/2011  . PUD (peptic ulcer disease) [K27.9] 12/18/2011  . Bacterial vaginosis [N76.0, A49.9] 06/19/2011  . Anemia [D64.9] 06/17/2011  . Thrombocytopenia  [D69.6] 06/17/2011  . UTI (urinary tract infection) [N39.0] 06/16/2011  . Hypothyroidism [E03.9] 06/12/2011  . Polysubstance abuse [F19.10] 06/12/2011   Total Time spent with patient: 30 minutes   Past Medical History:  Past Medical History  Diagnosis Date  . Depression   . Peptic ulcer   . Alcohol abuse   . Anemia   . Benzodiazepine abuse   . Thyroid disease   . Alcoholism   . Narcotic abuse   . Back pain   . Thrombocytopenia 06/17/2011  . Hypothyroidism   . Seizures   . Hypoglycemia   . Suicide attempt     multiple times    Past Surgical History  Procedure Laterality Date  . Cholecystectomy    . Abdominal surgery    . Esophagogastroduodenoscopy    . Gastric bypass    . Tubal ligation    . Esophagogastroduodenoscopy N/A 03/23/2013    Procedure: ESOPHAGOGASTRODUODENOSCOPY (EGD);  Surgeon: Ladene Artist, MD;  Location: [facility];  Service: Endoscopy;  Laterality: N/A;   Family History:  Family History  Problem Relation Age of Onset  . Alcohol abuse Father   . Alcoholism Father   . Cancer Other    Social History:  History  Alcohol Use  . Yes    Comment: drink 4 liters a day. 1 case/day drinks Listerine     History  Drug Use  . Yes  . Special: Oxycodone, Cocaine, Benzodiazepines    Comment: benzo    History   Social History  . Marital Status: Single    Spouse Name: N/A  . Number of Children: N/A  . Years of Education:  N/A   Social History Main Topics  . Smoking status: Current Every Day Smoker -- 1.00 packs/day for 12 years    Types: Cigarettes    Last Attempt to Quit: 08/08/2011  . Smokeless tobacco: Never Used  . Alcohol Use: Yes     Comment: drink 4 liters a day. 1 case/day drinks Listerine  . Drug Use: Yes    Special: Oxycodone, Cocaine, Benzodiazepines     Comment: benzo  . Sexual Activity: Yes    Birth Control/ Protection: Surgical   Other Topics Concern  . None   Social History Narrative   ** Merged History Encounter **        Additional History:    Sleep: Poor  Appetite:  Poor   Assessment:   Musculoskeletal: Strength & Muscle Tone: within normal limits Gait & Station: normal Patient leans: N/A   Psychiatric Specialty Exam: Physical Exam  Review of Systems  Constitutional: Positive for malaise/fatigue.  HENT: Negative.   Eyes: Negative.   Respiratory: Negative.   Cardiovascular: Negative.   Gastrointestinal: Negative.   Genitourinary: Negative.   Musculoskeletal: Negative.   Skin: Negative.   Neurological: Positive for weakness.  Endo/Heme/Allergies: Negative.   Psychiatric/Behavioral: Positive for depression, hallucinations and substance abuse. The patient is nervous/anxious.     Blood pressure 128/64, pulse 57, temperature 98.8 F (37.1 C), temperature source Oral, resp. rate 16, height '5\' 9"'  (1.753 m), weight 88.451 kg (195 lb), last menstrual period 02/13/2015.Body mass index is 28.78 kg/(m^2).  General Appearance: Fairly Groomed  Engineer, water::  Fair  Speech:  Clear and Coherent and Slow  Volume:  Decreased  Mood:  Anxious, Depressed and Dysphoric  Affect:  anxious worried  Thought Process:  Coherent and Goal Directed  Orientation:  Full (Time, Place, and Person)  Thought Content:  symptoms events worries concerns  Suicidal Thoughts:  Yes.  without intent/plan  Homicidal Thoughts:  No  Memory:  Immediate;   Fair Recent;   Fair Remote;   Fair  Judgement:  Fair  Insight:  Present and Shallow  Psychomotor Activity:  Restlessness  Concentration:  Fair  Recall:  [employer] of Knowledge:Fair  Language: Fair  Akathisia:  No  Handed:  Left  AIMS (if indicated):     Assets:  Desire for Improvement  ADL's:  Intact  Cognition: WNL  Sleep:  Number of Hours: 3.5     Current Medications: Current Facility-Administered Medications  Medication Dose Route Frequency Provider Last Rate Last Dose  . acetaminophen (TYLENOL) tablet 650 mg  650 mg Oral Q6H PRN Harriet Butte, NP   650  mg at 02/19/15 0942  . alum & mag hydroxide-simeth (MAALOX/MYLANTA) 200-200-20 MG/5ML suspension 30 mL  30 mL Oral Q4H PRN Harriet Butte, NP   30 mL at 02/21/15 1332  . amitriptyline (ELAVIL) tablet 100 mg  100 mg Oral QHS Nicholaus Bloom, MD      . feeding supplement (ENSURE ENLIVE) (ENSURE ENLIVE) liquid 237 mL  237 mL Oral BID BM Maricela Bo Ost[place], RD   237 mL at 02/20/15 1458  . FLUoxetine (PROZAC) capsule 60 mg  60 mg Oral Daily Harriet Butte, NP   60 mg at 02/21/15 5631  . folic acid (FOLVITE) tablet 1 mg  1 mg Oral Daily Harriet Butte, NP   1 mg at 02/21/15 4970  . gabapentin (NEURONTIN) tablet 800 mg  800 mg Oral TID Harriet Butte, NP   800 mg at 02/21/15 1159  .  ibuprofen (ADVIL,MOTRIN) tablet 400 mg  400 mg Oral Q8H PRN Niel Hummer, NP   400 mg at 02/21/15 0825  . levothyroxine (SYNTHROID, LEVOTHROID) tablet 125 mcg  125 mcg Oral QAC breakfast Harriet Butte, NP   125 mcg at 02/21/15 9826  . lithium carbonate capsule 150 mg  150 mg Oral BID WC Kerrie Buffalo, NP   150 mg at 02/21/15 0820  . LORazepam (ATIVAN) tablet 1 mg  1 mg Oral Q8H PRN Nicholaus Bloom, MD   1 mg at 02/21/15 1200  . magnesium hydroxide (MILK OF MAGNESIA) suspension 30 mL  30 mL Oral Daily PRN Harriet Butte, NP   30 mL at 02/21/15 0825  . metroNIDAZOLE (FLAGYL) tablet 500 mg  500 mg Oral Q12H Kerrie Buffalo, NP   500 mg at 02/21/15 4158  . multivitamin with minerals tablet 1 tablet  1 tablet Oral Daily Harriet Butte, NP   1 tablet at 02/19/15 0825  . nicotine (NICODERM CQ - dosed in mg/24 hours) patch 21 mg  21 mg Transdermal Daily Nicholaus Bloom, MD   21 mg at 02/21/15 3094  . pantoprazole (PROTONIX) EC tablet 40 mg  40 mg Oral BID Niel Hummer, NP   40 mg at 02/21/15 0610  . thiamine (B-1) injection 100 mg  100 mg Intramuscular Once Nicholaus Bloom, MD   100 mg at 02/18/15 0912  . thiamine (VITAMIN B-1) tablet 100 mg  100 mg Oral Daily Harriet Butte, NP   100 mg at 02/20/15 0768    Lab Results:  Results  for orders placed or performed during the hospital encounter of 02/17/15 (from the past 48 hour(s))  Comprehensive metabolic panel     Status: Abnormal   Collection Time: 02/21/15  6:30 AM  Result Value Ref Range   Sodium 136 135 - 145 mmol/L   Potassium 4.3 3.5 - 5.1 mmol/L   Chloride 100 (L) 101 - 111 mmol/L   CO2 27 22 - 32 mmol/L   Glucose, Bld 94 65 - 99 mg/dL   BUN 20 6 - 20 mg/dL   Creatinine, Ser 0.80 0.44 - 1.00 mg/dL   Calcium 8.4 (L) 8.9 - 10.3 mg/dL   Total Protein 6.6 6.5 - 8.1 g/dL   Albumin 3.8 3.5 - 5.0 g/dL   AST 46 (H) 15 - 41 U/L   ALT 51 14 - 54 U/L   Alkaline Phosphatase 81 38 - 126 U/L   Total Bilirubin 0.5 0.3 - 1.2 mg/dL   GFR calc non Af Amer >60 >60 mL/min   GFR calc Af Amer >60 >60 mL/min    Comment: (NOTE) The eGFR has been calculated using the CKD EPI equation. This calculation has not been validated in all clinical situations. eGFR's persistently <60 mL/min signify possible Chronic Kidney Disease.    Anion gap 9 5 - 15    Comment: Performed at [facility]    Physical Findings: AIMS: Facial and Oral Movements Muscles of Facial Expression: None, normal Lips and Perioral Area: None, normal Jaw: None, normal Tongue: None, normal,Extremity Movements Upper (arms, wrists, hands, fingers): None, normal Lower (legs, knees, ankles, toes): None, normal, Trunk Movements Neck, shoulders, hips: None, normal, Overall Severity Severity of abnormal movements (highest score from questions above): None, normal Incapacitation due to abnormal movements: None, normal Patient's awareness of abnormal movements (rate only patient's report): No Awareness, Dental Status Current problems with teeth and/or dentures?: No Does patient usually wear  dentures?: No  CIWA:  CIWA-Ar Total: 7 COWS:     Treatment Plan Summary: Daily contact with patient to assess and evaluate symptoms and progress in treatment and Medication management Supportive  approach/copins skills Mood instability; will pursue the Lithium at 150 mg BID and increase to 300 mg BID by tomorrow                            Will continue the Neurontin at 800 mg TID Insomnia; will D/C the Trazodone and try Elavil what she says has worked for her before                  Elavil 100 mg HS Depression; will continue the Prozac at 60 mg daily ( will be mindful of the interaction between the Prozac and the TCA possibly increasing the blood of level of the later) Will continue to work with CBT/mindfulness to help the anxiety-agitation. Will continue to challenge the catastrophic thinking Will order at [facility] to reassess the status of her hypothyroidism  Medical Decision Making:  Review of Psycho-Social Stressors (1), Review or order clinical lab tests (1), Review of Medication Regimen & Side Effects (2) and Review of New Medication or Change in Dosage (2)     Gary Bultman A 02/21/2015, 3:33 PM

## 2015-02-21 NOTE — Progress Notes (Signed)
Pt attended [place] speaker meeting. Pt appeared to be attentive and engaged.

## 2015-02-21 NOTE — Progress Notes (Signed)
Recreation Therapy Notes  Date: 02/21/15 Time: 9:30am Location: [address] Dayroom  Group Topic: Stress Management  Goal Area(s) Addresses:  Patient will verbalize importance of using healthy stress management.  Patient will identify positive emotions associated with healthy stress management.   Behavioral Response: Queit   Intervention: Stress Management  Activity :  Progressive Muscle Relaxation.  LRT introduced and educated patients on progressive muscle relaxation.  A script was used to deliver the technique to patients.  Patients were asked to follow the script read aloud by LRT to engage in practicing the stress management technique.  Education:  Stress Management, Discharge Planning.   Education Outcome: Acknowledges edcuation/In group clarification offered/Needs additional education  Clinical Observations/Feedback: Patient came at the very end of group.  Victorino Sparrow, LRT/CTRS  Victorino Sparrow A 02/21/2015 4:22 PM

## 2015-02-21 NOTE — Progress Notes (Signed)
Patient ID: Jacqueline [name], female   DOB: 08/09/1966, 49 y.o.   MRN: [id number] D: Client visible on the unit, interacts appropriately with visitor, staff, and peers. Client reports anxiety and depression as "7" of 10. "I feel bloated, had milk of mag today" A: Writer provided emotional support, encouraged fluids, received orders from [place]. PA for lactulose(see MAR). Staff will monitor q68min for safety. R: Client is safe on the unit, attended group.

## 2015-02-21 NOTE — Progress Notes (Signed)
Pt attended spiritual care group on grief and loss facilitated by chaplain Jerene Pitch. Group opened with brief discussion and psycho-social ed around grief and loss in relationships and in relation to self - identifying life patterns, circumstances, changes that cause losses. Established group norm of speaking from own life experience. Group goal of establishing open and affirming space for members to share loss and experience with grief, normalize grief experience and provide psycho social education and grief support.  Group drew on narrative and Alderian therapeutic modalities.    Jacqueline [name] was present and attentive throughout group.  She left group to attend care planning and returned shortly thereafter.  Resonated with group's conversation about grief in recovery.  Jacqueline [name] identified losing a coping mechanism and friends associated with using as a cause of grief in her recovery.  Described feeling helpful that others recognized this as well and spoke briefly with group about alternatives for coping as well as benefit of group support where others recognize this grief.   [place], [place].

## 2015-02-21 NOTE — [facility] Suicide Risk Assessment (Signed)
[facility] INPATIENT:  Family/Significant Other Suicide Prevention Education  Suicide Prevention Education:  Contact Attempts: Gevena Barre (pt's friend and roommate) [phone] has been identified by the patient as the family member/significant other with whom the patient will be residing, and identified as the person(s) who will aid the patient in the event of a mental health crisis.  With written consent from the patient, two attempts were made to provide suicide prevention education, prior to and/or following the patient's discharge.  We were unsuccessful in providing suicide prevention education.  A suicide education pamphlet was given to the patient to share with family/significant other.  Date and time of first attempt: 02/21/15 at 9:45AM Date and time of second attempt: 02/21/15 at 3:20PM (voicemail left requesting call back at earliest convenience)   [place], [place] 02/21/2015, 3:20 PM

## 2015-02-21 NOTE — [facility] Group Notes (Signed)
[facility] LCSW Aftercare Discharge Planning Group Note   02/21/2015 10:10 AM  Participation Quality:  Appropriate   Mood/Affect:  Depressed/Flat   Depression Rating:  9  Anxiety Rating:  10  Thoughts of Suicide:  Yes Will you contract for safety?   Yes  Current AVH:  Yes-pt reports hearing voices and seeing worms.   Plan for Discharge/Comments:  Pt reports that she had been doing "okay" but relapsed a few months ago on alcohol. She has been living with a female friend who is sober and supportive. Pt hopes to get back into [facility]-not eligible due to past behavioral issues and not being a [place] resident. Pt hesitant regarding [facility] referral, although Dr. Sabra Heck requested that referral be made. 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 CSW assessing. Pt reports poor sleep.   Transportation Means: unknown at this time.   Supports: friend, Geophysical data processor, [place]

## 2015-02-22 MED ORDER — LEVOTHYROXINE SODIUM 150 MCG PO TABS
150.0000 ug | ORAL_TABLET | Freq: Every day | ORAL | Status: DC
  Administered 2015-02-23: 150 ug via ORAL
  Filled 2015-02-22 (×2): qty 1

## 2015-02-22 MED ORDER — FLEET ENEMA 7-19 GM/118ML RE ENEM
1.0000 | ENEMA | Freq: Once | RECTAL | Status: AC
  Administered 2015-02-22: 1 via RECTAL
  Filled 2015-02-22: qty 1

## 2015-02-22 MED ORDER — BISACODYL 10 MG RE SUPP
10.0000 mg | Freq: Once | RECTAL | Status: AC
  Administered 2015-02-22: 10 mg via RECTAL
  Filled 2015-02-22 (×2): qty 1

## 2015-02-22 MED ORDER — ONDANSETRON 4 MG PO TBDP
4.0000 mg | ORAL_TABLET | Freq: Three times a day (TID) | ORAL | Status: DC | PRN
  Administered 2015-02-22 – 2015-02-23 (×2): 4 mg via ORAL
  Filled 2015-02-22 (×2): qty 1

## 2015-02-22 MED ORDER — HALOPERIDOL 0.5 MG PO TABS: 0.5000 mg | ORAL_TABLET | Freq: Three times a day (TID) | ORAL | Status: DC

## 2015-02-22 MED ORDER — HALOPERIDOL 1 MG PO TABS
1.0000 mg | ORAL_TABLET | Freq: Three times a day (TID) | ORAL | Status: DC
  Administered 2015-02-22 – 2015-02-23 (×4): 1 mg via ORAL
  Filled 2015-02-22 (×8): qty 1

## 2015-02-22 NOTE — Progress Notes (Signed)
[facility] MD Progress Note  02/22/2015 6:06 PM Jacqueline [name]  MRN:  [id number] Subjective:  Admits she is still having a lot of racing thoughts, mood instability, crying spells. She is not sure what to do. Admits she is constantly thinking worrying. Admits that every time she relapses it gets worst and is harder to recover. She states that when she was here once before she was tried on Haldol and it did help the racing thoughts and her mood. l Principal Problem: Alcohol dependence with withdrawal, uncomplicated Diagnosis:   Patient Active Problem List   Diagnosis Date Noted  . Bipolar disorder, current episode depressed, severe, without psychotic features [F31.4] 02/17/2015    Priority: High  . Alcohol dependence with withdrawal, uncomplicated [G92.119] 41/74/0814    Priority: High  . PTSD (post-traumatic stress disorder) [F43.10] 08/03/2013    Priority: High  . Unspecified episodic mood disorder [F39] 05/14/2013    Priority: High  . Cocaine abuse, episodic [F14.10] 12/18/2011    Priority: High    Class: Acute  . Anxiety disorder [F41.9] 06/19/2011    Priority: High  . Alcoholism [F10.20]   . Intentional ibuprofen overdose [T39.312A] 10/17/2014  . Severe recurrent major depression without psychotic features [F33.2] 10/17/2014  . Substance induced mood disorder [F19.94] 10/17/2014  . Overdose [T50.901A]   . Suicidal ideation [R45.851]   . 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 Hallucinations [R44.3] 04/14/2013  . Anastomotic ulcer, acute [K28.3] 03/23/2013  . Melena [K92.1] 03/21/2013  . Abnormal liver enzymes [R74.8] 12/18/2011  . PUD (peptic ulcer disease) [K27.9] 12/18/2011  . Bacterial vaginosis [N76.0, A49.9] 06/19/2011  . Anemia [D64.9] 06/17/2011  . Thrombocytopenia [D69.6] 06/17/2011  . UTI (urinary tract infection) [N39.0] 06/16/2011  . Hypothyroidism [E03.9] 06/12/2011  . Polysubstance abuse [F19.10]  06/12/2011   Total Time spent with patient: 30 minutes   Past Medical History:  Past Medical History  Diagnosis Date  . Depression   . Peptic ulcer   . Alcohol abuse   . Anemia   . Benzodiazepine abuse   . Thyroid disease   . Alcoholism   . Narcotic abuse   . Back pain   . Thrombocytopenia 06/17/2011  . Hypothyroidism   . Seizures   . Hypoglycemia   . Suicide attempt     multiple times    Past Surgical History  Procedure Laterality Date  . Cholecystectomy    . Abdominal surgery    . Esophagogastroduodenoscopy    . Gastric bypass    . Tubal ligation    . Esophagogastroduodenoscopy N/A 03/23/2013    Procedure: ESOPHAGOGASTRODUODENOSCOPY (EGD);  Surgeon: Ladene Artist, MD;  Location: [facility];  Service: Endoscopy;  Laterality: N/A;   Family History:  Family History  Problem Relation Age of Onset  . Alcohol abuse Father   . Alcoholism Father   . Cancer Other    Social History:  History  Alcohol Use  . Yes    Comment: drink 4 liters a day. 1 case/day drinks Listerine     History  Drug Use  . Yes  . Special: Oxycodone, Cocaine, Benzodiazepines    Comment: benzo    History   Social History  . Marital Status: Single    Spouse Name: N/A  . Number of Children: N/A  . Years of Education: N/A   Social History Main Topics  . Smoking status: Current Every Day Smoker -- 1.00 packs/day for 12 years    Types: Cigarettes  Last Attempt to Quit: 08/08/2011  . Smokeless tobacco: Never Used  . Alcohol Use: Yes     Comment: drink 4 liters a day. 1 case/day drinks Listerine  . Drug Use: Yes    Special: Oxycodone, Cocaine, Benzodiazepines     Comment: benzo  . Sexual Activity: Yes    Birth Control/ Protection: Surgical   Other Topics Concern  . None   Social History Narrative   ** Merged History Encounter **       Additional History:    Sleep: Fair  Appetite:  Fair   Assessment:   Musculoskeletal: Strength & Muscle Tone: within normal  limits Gait & Station: normal Patient leans: N/A   Psychiatric Specialty Exam: Physical Exam  Review of Systems  Constitutional: Positive for malaise/fatigue.  HENT: Negative.   Eyes: Negative.   Respiratory: Negative.   Cardiovascular: Negative.   Gastrointestinal: Positive for constipation.  Genitourinary: Negative.   Musculoskeletal: Negative.   Skin: Negative.   Neurological: Positive for weakness.  Endo/Heme/Allergies: Negative.   Psychiatric/Behavioral: Positive for depression and substance abuse. The patient is nervous/anxious.     Blood pressure 123/75, pulse 63, temperature 98.3 F (36.8 C), temperature source Oral, resp. rate 16, height _0  (1.753 m), weight 88.451 kg (195 lb), last menstrual period 02/13/2015.Body mass index is 28.78 kg/(m^2).  General Appearance: Fairly Groomed  Engineer, water::  Fair  Speech:  Clear and Coherent  Volume:  fluctuates  Mood:  Anxious, Depressed, Dysphoric and Hopeless  Affect:  Depressed and anxious worried  Thought Process:  Coherent and Goal Directed  Orientation:  Full (Time, Place, and Person)  Thought Content:  symptoms events worries concerns  Suicidal Thoughts:  No  Homicidal Thoughts:  No  Memory:  Immediate;   Fair Recent;   Fair Remote;   Fair  Judgement:  Fair  Insight:  Present and Shallow  Psychomotor Activity:  Restlessness  Concentration:  Poor  Recall:  [employer] of Knowledge:Fair  Language: Fair  Akathisia:  No  Handed:  Right  AIMS (if indicated):     Assets:  Desire for Improvement  ADL's:  Intact  Cognition: WNL  Sleep:  Number of Hours: 6.25     Current Medications: Current Facility-Administered Medications  Medication Dose Route Frequency Provider Last Rate Last Dose  . acetaminophen (TYLENOL) tablet 650 mg  650 mg Oral Q6H PRN Harriet Butte, NP   650 mg at 02/19/15 0942  . alum & mag hydroxide-simeth (MAALOX/MYLANTA) 200-200-20 MG/5ML suspension 30 mL  30 mL Oral Q4H PRN Harriet Butte, NP    30 mL at 02/21/15 1332  . amitriptyline (ELAVIL) tablet 100 mg  100 mg Oral QHS Nicholaus Bloom, MD   100 mg at 02/21/15 2145  . feeding supplement (ENSURE ENLIVE) (ENSURE ENLIVE) liquid 237 mL  237 mL Oral BID BM Maricela Bo Ostheim, RD   237 mL at 02/22/15 1434  . FLUoxetine (PROZAC) capsule 60 mg  60 mg Oral Daily Harriet Butte, NP   60 mg at 02/22/15 0755  . folic acid (FOLVITE) tablet 1 mg  1 mg Oral Daily Harriet Butte, NP   1 mg at 02/22/15 0755  . gabapentin (NEURONTIN) tablet 800 mg  800 mg Oral TID Harriet Butte, NP   800 mg at 02/22/15 1650  . haloperidol (HALDOL) tablet 1 mg  1 mg Oral TID Nicholaus Bloom, MD   1 mg at 02/22/15 1650  . ibuprofen (ADVIL,MOTRIN) tablet 400 mg  400 mg Oral Q8H PRN Niel Hummer, NP   400 mg at 02/22/15 0757  . lactulose (CHRONULAC) 10 GM/15ML solution 20 g  20 g Oral BID PRN Laverle Hobby, PA-C   20 g at 02/22/15 9629  . [START ON 02/23/2015] levothyroxine (SYNTHROID, LEVOTHROID) tablet 150 mcg  150 mcg Oral QAC breakfast Encarnacion Slates, NP      . lithium carbonate capsule 150 mg  150 mg Oral BID WC Kerrie Buffalo, NP   150 mg at 02/22/15 1650  . LORazepam (ATIVAN) tablet 1 mg  1 mg Oral Q8H PRN Nicholaus Bloom, MD   1 mg at 02/22/15 1434  . magnesium hydroxide (MILK OF MAGNESIA) suspension 30 mL  30 mL Oral Daily PRN Harriet Butte, NP   30 mL at 02/21/15 0825  . metroNIDAZOLE (FLAGYL) tablet 500 mg  500 mg Oral Q12H Kerrie Buffalo, NP   500 mg at 02/22/15 0755  . multivitamin with minerals tablet 1 tablet  1 tablet Oral Daily Harriet Butte, NP   1 tablet at 02/19/15 0825  . nicotine (NICODERM CQ - dosed in mg/24 hours) patch 21 mg  21 mg Transdermal Daily Nicholaus Bloom, MD   21 mg at 02/22/15 0757  . ondansetron (ZOFRAN-ODT) disintegrating tablet 4 mg  4 mg Oral Q8H PRN Niel Hummer, NP   4 mg at 02/22/15 1802  . pantoprazole (PROTONIX) EC tablet 40 mg  40 mg Oral BID Niel Hummer, NP   40 mg at 02/22/15 1650  . thiamine (B-1) injection 100 mg  100  mg Intramuscular Once Nicholaus Bloom, MD   100 mg at 02/18/15 0912  . thiamine (VITAMIN B-1) tablet 100 mg  100 mg Oral Daily Harriet Butte, NP   100 mg at 02/20/15 5284    Lab Results:  Results for orders placed or performed during the hospital encounter of 02/17/15 (from the past 48 hour(s))  Comprehensive metabolic panel     Status: Abnormal   Collection Time: 02/21/15  6:30 AM  Result Value Ref Range   Sodium 136 135 - 145 mmol/L   Potassium 4.3 3.5 - 5.1 mmol/L   Chloride 100 (L) 101 - 111 mmol/L   CO2 27 22 - 32 mmol/L   Glucose, Bld 94 65 - 99 mg/dL   BUN 20 6 - 20 mg/dL   Creatinine, Ser 0.80 0.44 - 1.00 mg/dL   Calcium 8.4 (L) 8.9 - 10.3 mg/dL   Total Protein 6.6 6.5 - 8.1 g/dL   Albumin 3.8 3.5 - 5.0 g/dL   AST 46 (H) 15 - 41 U/L   ALT 51 14 - 54 U/L   Alkaline Phosphatase 81 38 - 126 U/L   Total Bilirubin 0.5 0.3 - 1.2 mg/dL   GFR calc non Af Amer >60 >60 mL/min   GFR calc Af Amer >60 >60 mL/min    Comment: (NOTE) The eGFR has been calculated using the CKD EPI equation. This calculation has not been validated in all clinical situations. eGFR's persistently <60 mL/min signify possible Chronic Kidney Disease.    Anion gap 9 5 - 15    Comment: Performed at [facility]  TSH     Status: Abnormal   Collection Time: 02/21/15  7:27 PM  Result Value Ref Range   TSH 23.607 (H) 0.350 - 4.500 uIU/mL    Comment: Performed at [facility]    Physical Findings: AIMS: Facial and Oral  Movements Muscles of Facial Expression: None, normal Lips and Perioral Area: None, normal Jaw: None, normal Tongue: None, normal,Extremity Movements Upper (arms, wrists, hands, fingers): None, normal Lower (legs, knees, ankles, toes): None, normal, Trunk Movements Neck, shoulders, hips: None, normal, Overall Severity Severity of abnormal movements (highest score from questions above): None, normal Incapacitation due to abnormal movements: None,  normal Patient's awareness of abnormal movements (rate only patient's report): No Awareness, Dental Status Current problems with teeth and/or dentures?: No Does patient usually wear dentures?: No  CIWA:  CIWA-Ar Total: 4 COWS:     Treatment Plan Summary: Daily contact with patient to assess and evaluate symptoms and progress in treatment and Medication management Supportive approach/coping skills Mood instability/racing thoughts; will start a trial with Haldol 1 mg TID and optimize response Hypothyroidism; Reviewed the TSH: 23.607. Will go ahead an increase the synthroid to 150 mcg daily Constipation; will use Dulcolax suppository and if not effective will use an enema Will continue to work a relapse prevention plan  Medical Decision Making:  Review of Psycho-Social Stressors (1), Review or order clinical lab tests (1), Review of Medication Regimen & Side Effects (2) and Review of New Medication or Change in Dosage (2)     Anacleto Batterman A 02/22/2015, 6:06 PM

## 2015-02-22 NOTE — Plan of Care (Signed)
Problem: [facility] Concurrent Medical Problem Goal: STG-Compliance with medication and/or treatment as ordered (STG-Compliance with medication and/or treatment as ordered by MD)  Outcome: Progressing Client is compliant with medications as evidenced by taking medications as prescribed, without incidence.

## 2015-02-22 NOTE — Progress Notes (Signed)
Patient ID: Jacqueline [name], female   DOB: 12-17-1965, 49 y.o.   MRN: [id number] D: Client visible on the unit, affect brighter, reports anxiety as "4" of 10, no BM yet, but having flatulence. A: Writer provided emotional support, administered medication as ordered, also gave warm prune juice to increase peristalsis. Staff will monitor q3min for safety. R: Client is safe on the unit, attended group.

## 2015-02-22 NOTE — [facility] Group Notes (Signed)
Adult Psychoeducational Group Note  Date:  02/22/2015 Time:  0900 am  Group Topic/Focus:  Goals Group:   The focus of this group is to help patients establish daily goals to achieve during treatment and discuss how the patient can incorporate goal setting into their daily lives to aide in recovery. Orientation:   The focus of this group is to educate the patient on the purpose and policies of crisis stabilization and provide a format to answer questions about their admission.  The group details unit policies and expectations of patients while admitted.  Participation Level:  Minimal  Participation Quality:  Inattentive  Affect:  Irritable  Cognitive:  Alert  Insight: Appropriate  Engagement in Group:  Defensive, Distracting and Off Topic  Modes of Intervention:  Discussion, Education, Orientation and Support  Additional Comments:  Pt was irritable during group, often rolling eyes, tapping finger nails and sighing loudly when other people were sharing, pt had several times where she was trying to change the subject when other patient's were sharing, pt's goal for today was "to have a bowel movement", even after staff redirected and told pt we could talk about that in private with MD today she insisted on discussing her personal bathroom/bowel issues allowed in the group.  Wynn Banker 02/22/2015, 9:29 AM

## 2015-02-22 NOTE — Progress Notes (Signed)
D:  Per pt self inventory pt reports sleeping goof, appetite good, energy level low, ability to pay attention good, rates depression at a 6 out of 10,hopelessness at a 0 out of 10, and anxiety at a 7 out of 10, denies SI/HI/AVH, irritable during interaction, c/o constipation.    A:  Emotional support provided, Encouraged pt to continue with treatment plan and attend all group activities, q15 min checks maintained for safety, NP aware of pt complaint of constipation.  R:  Pt is going to groups with minimal interest and participation, pt is irritable this am with staff and other patients on the unit.

## 2015-02-22 NOTE — [facility] Group Notes (Signed)
[place] LCSW Group Therapy  02/22/2015 1:27 PM  Type of Therapy:  Group Therapy  Participation Level:  Active  Participation Quality:  Attentive  Affect:  Appropriate  Cognitive:  Alert and Oriented  Insight:  Improving  Engagement in Therapy:  Engaged  Modes of Intervention:  Confrontation, Discussion, Education, Exploration, Problem-solving, Rapport Building, Socialization and Support  Summary of Progress/Problems: [facility] Speaker came to talk about his personal journey with substance abuse and addiction. The pt processed ways by which to relate to the speaker. [place] speaker provided handouts and educational information pertaining to groups and services offered by the [facility].   Smart, Marka Treloar LCSWA 02/22/2015, 1:27 PM

## 2015-02-22 NOTE — Progress Notes (Signed)
Recreation Therapy Notes  Animal-Assisted Activity (AAA) Program Checklist/Progress Notes Patient Eligibility Criteria Checklist & Daily Group note for Rec Tx Intervention  Date: 02/22/15 Time: 2:30pm Location: [address]   AAA/T Program Assumption of Risk Form signed by Patient/ or Parent Legal Guardian YES   Patient is free of allergies or sever asthma YES   Patient reports no fear of animals YES  Patient reports no history of cruelty to animals YES   Patient understands his/her participation is voluntary YES   Patient washes hands before animal contact YES   Patient washes hands after animal contact YES  Behavioral Response: None  Education: Contractor, Appropriate Animal Interaction   Education Outcome: Acknowledges understanding/In group clarification offered/Needs additional education.   Clinical Observations/Feedback: Patient did not attend group.  Victorino Sparrow, LRT, CTRS         Ria Comment, Derril Franek A 02/22/2015 4:12 PM

## 2015-02-22 NOTE — Tx Team (Signed)
Interdisciplinary Treatment Plan Update (Adult)  Date:  02/22/2015  Time Reviewed:  9:53 AM   Progress in Treatment: Attending groups: Yes  Participating in groups:  Yes  Taking medication as prescribed:  Yes. Tolerating medication:  Yes. Family/Significant othe contact made:  Contact attempts made with pt's friend. SPE completed with pt.  Patient understands diagnosis:  Yes. and As evidenced by:  seeking treatment for ETOH detox, mood stabilization, and SI Discussing patient identified problems/goals with staff:  Yes. Medical problems stabilized or resolved:  Yes. Denies suicidal/homicidal ideation: Yes, during self report.  Issues/concerns per patient self-inventory:  Other:  Discharge Plan or Barriers: Pt wants [place] unable to go to [facility] due to past behavioral issues and not being [place] resident. Pt plans to return home and follow-up with [facility] for outpatient services and CST. Pt has appt scheduled with [place] and wellness for orange card application/meeting with financial counselor being that her primary identified concern involves affording medications.   Reason for Continuation of Hospitalization: Depression Medication stabilization Withdrawal symptoms  Comments:  Jacqueline [name] is an 49 y.o. female presented to [place] while trying to stop drinking alcohol. Per chart review notes, pt was suicidal on admission however, pt denied SI in assessment. Pt reported she does not want to die, she just wanted to stop using alcohol. Pt reports that she drinks 6 to 8 oz of Listerine daily. Pt reports that her liver really hurts and she can't keep drinking. Pt denies all other substance abuse, stating that she has not used anything else for years. Pt denies HI and access to weapons. Pt states that she hears voices and sees things but that hasn't happened for the last week. Pt reports feeling depressed for years with symptoms including  insomnia, depressed mood, lack of interest in things she used to care about, increased irritability, guilt, worthlessness, fatigue and tearfulness. Pt reports that she has lost everything due to alcohol and needs to stop drinking. Pt reports that she has manic episodes and a history of bipolar disorder symptoms. Pt has been treated at [facility] multiple times in 2014 and 2015. Pt reports she sees a therapist and psychiatrist and has a [place] team.   5/17: Pt has been attending and participating in groups. She reports minimal withdrawals and improving mood. Pt denies SI/HI/AVH today but continues to demonstrate irritability at times.   Estimated length of stay:  1-2 days (tentative d/c scheduled for [facility])   Additional Comments:  Patient and CSW reviewed pt's identified goals and treatment plan. Patient verbalized understanding and agreed to treatment plan. CSW reviewed [facility] "Discharge Process and Patient Involvement" Form. Pt verbalized understanding of information provided and signed form.   Attendees: Patient:   02/22/2015 9:53 AM   Family:   02/22/2015 9:53 AM   Physician:  Dr. Carlton Adam, MD 02/22/2015 9:53 AM   Nursing:   Dolores Frame RN 02/22/2015 9:53 AM   Clinical Social Worker: Maxie Better, [place]  02/22/2015 9:53 AM   Clinical Social Worker: Erasmo Downer Drinkard LCSWA 02/22/2015 9:53 AM   Other:  Gerline Legacy Nurse Case Manager 02/22/2015 9:53 AM   Other:  Lucinda Dell; [place] TCT  02/22/2015 9:53 AM   Other:   02/22/2015 9:53 AM   Other:  02/22/2015 9:53 AM   Other:  02/22/2015 9:53 AM   Other:  02/22/2015 9:53 AM    02/22/2015 9:53 AM    02/22/2015 9:53 AM    02/22/2015 9:53 AM  02/22/2015 9:53 AM    Scribe for Treatment Team:   [place], [place]  02/22/2015 9:53 AM

## 2015-02-22 NOTE — Clinical Social Work Note (Signed)
Patient has [place] care coordinator, Di Kindle, [phone].

## 2015-02-23 ENCOUNTER — Encounter (HOSPITAL_COMMUNITY): Payer: Self-pay | Admitting: Registered Nurse

## 2015-02-23 MED ORDER — OMEPRAZOLE 40 MG PO CPDR: 40.0000 mg | DELAYED_RELEASE_CAPSULE | Freq: Every day | ORAL | Status: DC

## 2015-02-23 MED ORDER — GABAPENTIN 400 MG PO CAPS
800.0000 mg | ORAL_CAPSULE | Freq: Three times a day (TID) | ORAL | Status: DC
Start: 2015-02-24 — End: 2015-02-23
  Filled 2015-02-23: qty 84

## 2015-02-23 MED ORDER — AMITRIPTYLINE HCL 100 MG PO TABS: 100.0000 mg | ORAL_TABLET | Freq: Every day | ORAL | Status: DC

## 2015-02-23 MED ORDER — LEVOTHYROXINE SODIUM 150 MCG PO TABS: 150.0000 ug | ORAL_TABLET | Freq: Every day | ORAL | Status: DC

## 2015-02-23 MED ORDER — FLUOXETINE HCL 20 MG PO CAPS: 60.0000 mg | ORAL_CAPSULE | Freq: Every day | ORAL | Status: DC

## 2015-02-23 MED ORDER — ADULT MULTIVITAMIN W/MINERALS CH: 1.0000 | ORAL_TABLET | Freq: Every day | ORAL | Status: DC

## 2015-02-23 MED ORDER — LACTULOSE 10 GM/15ML PO SOLN
20.0000 g | Freq: Two times a day (BID) | ORAL | Status: DC | PRN
Start: 2015-02-23 — End: 2015-07-07

## 2015-02-23 MED ORDER — METRONIDAZOLE 500 MG PO TABS: 500.0000 mg | ORAL_TABLET | Freq: Two times a day (BID) | ORAL | Status: AC

## 2015-02-23 MED ORDER — GABAPENTIN 800 MG PO TABS: 800.0000 mg | ORAL_TABLET | Freq: Three times a day (TID) | ORAL | Status: DC

## 2015-02-23 MED ORDER — FOLIC ACID 1 MG PO TABS: 1.0000 mg | ORAL_TABLET | Freq: Every day | ORAL | Status: DC

## 2015-02-23 MED ORDER — HALOPERIDOL 1 MG PO TABS: 1.0000 mg | ORAL_TABLET | Freq: Three times a day (TID) | ORAL | Status: DC

## 2015-02-23 MED ORDER — LITHIUM CARBONATE 150 MG PO CAPS: 150.0000 mg | ORAL_CAPSULE | Freq: Two times a day (BID) | ORAL | Status: DC

## 2015-02-23 NOTE — Progress Notes (Signed)
D/C instructions/meds/follow-up appointments reviewed, pt verbalized understanding, pt's belongings returned to pt, samples given, denies SI/HI/AVH.

## 2015-02-23 NOTE — Progress Notes (Signed)
Recreation Therapy Notes  Date:  02/23/15 Time: 9:30am Location: [address] Day[address]  Group Topic: Stress Management  Goal Area(s) Addresses:  Patient will actively participate in stress management techniques presented during session.   Intervention: Stress management techniques  Activity:  Guided Imagery. LRT read a script that walked patients through the technique Guided Imagery. Technique was coupled with deep breathing.   Education:  Stress Management, Discharge Planning.   Clinical Observations/Feedback: Patient did not attend group.   Sharyl Panchal Ria Comment LRT/CTRS  Victorino Sparrow A 02/23/2015 3:39 PM
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
## 2015-02-23 NOTE — BHH Group Notes (Signed)
   [facility] LCSW Aftercare Discharge Planning Group Note  02/23/2015  8:45 AM   Participation Quality: Alert, Appropriate and Oriented  Mood/Affect: Appropriate  Depression Rating: 4  Anxiety Rating: 4-5  Thoughts of Suicide: Pt denies SI/HI  Will you contract for safety? Yes  Current AVH: Pt denies  Plan for Discharge/Comments: Pt attended discharge planning group and actively participated in group. CSW provided pt with today's workbook. Patient plans to return home to follow up with outpatient services at [facility] and [facility].   Transportation Means: Pt reports access to transportation  Supports: No supports mentioned at this time  Tilden Fossa, MSW, [place] Social Worker [employer] [phone]

## 2015-02-23 NOTE — Discharge Summary (Signed)
Physician Discharge Summary Note  Patient:  Jacqueline [name] is an 49 y.o., female MRN:  [id number] DOB:  1966/03/18 Patient phone:  [phone] (home)  Patient address:   [address] [place] [address],  Total Time spent with patient: Greater than 30 minutes  Date of Admission:  02/17/2015 Date of Discharge: 02/23/2015 49 Y/O female know to our service who states she was abstinent for 6 months. States she does not really know what trigger the relapse " my medicine not working?." Relapsed a month ago, states he is drinking every day Listerine 2 big bottles and beer like 10. If she does not drink the Listerine drinks a 24 beers case or so a day. Last drink was 24 hours ago. states she is sweating has nausea vomiting diarrhea has shakes. States "people scare me." states she is seeing spots. States she had to go off medications as ran out of money.  The initial assessment is as follows: Jacqueline [name] is an 49 y.o. female presented to [facility] while trying to stop drinking alcohol. Per chart review notes, pt was suicidal on admission however, pt denied SI in assessment. Pt reported she does not want to die, she just wanted to stop using alcohol. Pt reports that she drinks 6 to 8 oz of Listerine daily. Pt reports that her liver really hurts and she can't keep drinking. Pt denies all other substance abuse, stating that she has not used anything else for years. Pt denies HI and access to weapons. Pt states that she hears voices and sees things but that hasn't happened for the last week. Pt reports feeling depressed for years with symptoms including insomnia, depressed mood, lack of interest in things she used to care about, increased irritability, guilt, worthlessness, fatigue and tearfulness. Pt reports that she has lost everything due to alcohol and needs to stop drinking. Pt reports that she has manic episodes and a history of bipolar disorder symptoms. Pt has been treated at [facility] multiple times in  2014 and 2015. Pt reports she sees a therapist and psychiatrist and has a [facility] team. Per Waylan Boga, DNP, pt meets inpatient substance abuse/dual diagnosis treatment criteria. Placement will be sought. Reason for Admission:  Per H&P admission:    Principal Problem: Alcohol dependence with withdrawal, uncomplicated Discharge Diagnoses: Patient Active Problem List   Diagnosis Date Noted  . Bipolar disorder, current episode depressed, severe, without psychotic features [F31.4] 02/17/2015  . Alcoholism [F10.20]   . Alcohol dependence with withdrawal, uncomplicated [X09.407] 68/05/8109  . Intentional ibuprofen overdose [T39.312A] 10/17/2014  . Severe recurrent major depression without psychotic features [F33.2] 10/17/2014  . Substance induced mood disorder [F19.94] 10/17/2014  . Overdose [T50.901A]   . Suicidal ideation [R45.851]   . 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 PTSD (post-traumatic stress disorder) [F43.10] 08/03/2013  . Unspecified episodic mood disorder [F39] 05/14/2013  . Hallucinations [R44.3] 04/14/2013  . Anastomotic ulcer, acute [K28.3] 03/23/2013  . Melena [K92.1] 03/21/2013  . Abnormal liver enzymes [R74.8] 12/18/2011  . Cocaine abuse, episodic [F14.10] 12/18/2011    Class: Acute  . PUD (peptic ulcer disease) [K27.9] 12/18/2011  . Anxiety disorder [F41.9] 06/19/2011  . Bacterial vaginosis [N76.0, A49.9] 06/19/2011  . Anemia [D64.9] 06/17/2011  . Thrombocytopenia [D69.6] 06/17/2011  . UTI (urinary tract infection) [N39.0] 06/16/2011  . Hypothyroidism [E03.9] 06/12/2011  . Polysubstance abuse [F19.10] 06/12/2011    Musculoskeletal: Strength & Muscle Tone: within normal limits Gait & Station: normal Patient  leans: N/A  Psychiatric Specialty Exam:  See Suicide Risk Assessment Physical Exam  Nursing note and vitals reviewed. Constitutional: She is oriented to person, place, and time.  Neck: Normal  range of motion.  Respiratory: Effort normal.  Musculoskeletal: Normal range of motion.  Neurological: She is alert and oriented to person, place, and time.    Review of Systems  Psychiatric/Behavioral: Negative for suicidal ideas and hallucinations. Depression: Stable. Nervous/anxious: Stable. Insomnia: Stable.   All other systems reviewed and are negative.   Blood pressure 115/71, pulse 58, temperature 98.8 F (37.1 C), temperature source Oral, resp. rate 20, height _0  (1.753 m), weight 88.451 kg (195 lb), last menstrual period 02/13/2015.Body mass index is 28.78 kg/(m^2).  Have you used any form of tobacco in the last 30 days? (Cigarettes, Smokeless Tobacco, Cigars, and/or Pipes): Yes  Has this patient used any form of tobacco in the last 30 days? (Cigarettes, Smokeless Tobacco, Cigars, and/or Pipes) Yes, A prescription for an FDA-approved tobacco cessation medication was offered at discharge and the patient refused  Past Medical History:  Past Medical History  Diagnosis Date  . Depression   . Peptic ulcer   . Alcohol abuse   . Anemia   . Benzodiazepine abuse   . Thyroid disease   . Alcoholism   . Narcotic abuse   . Back pain   . Thrombocytopenia 06/17/2011  . Hypothyroidism   . Seizures   . Hypoglycemia   . Suicide attempt     multiple times    Past Surgical History  Procedure Laterality Date  . Cholecystectomy    . Abdominal surgery    . Esophagogastroduodenoscopy    . Gastric bypass    . Tubal ligation    . Esophagogastroduodenoscopy N/A 03/23/2013    Procedure: ESOPHAGOGASTRODUODENOSCOPY (EGD);  Surgeon: Ladene Artist, MD;  Location: [facility];  Service: Endoscopy;  Laterality: N/A;   Family History:  Family History  Problem Relation Age of Onset  . Alcohol abuse Father   . Alcoholism Father   . Cancer Other    Social History:  History  Alcohol Use  . Yes    Comment: drink 4 liters a day. 1 case/day drinks Listerine     History  Drug Use  . Yes   . Special: Oxycodone, Cocaine, Benzodiazepines    Comment: benzo    History   Social History  . Marital Status: Single    Spouse Name: N/A  . Number of Children: N/A  . Years of Education: N/A   Social History Main Topics  . Smoking status: Current Every Day Smoker -- 1.00 packs/day for 12 years    Types: Cigarettes    Last Attempt to Quit: 08/08/2011  . Smokeless tobacco: Never Used  . Alcohol Use: Yes     Comment: drink 4 liters a day. 1 case/day drinks Listerine  . Drug Use: Yes    Special: Oxycodone, Cocaine, Benzodiazepines     Comment: benzo  . Sexual Activity: Yes    Birth Control/ Protection: Surgical   Other Topics Concern  . None   Social History Narrative   ** Merged History Encounter **       Risk to Self: Is patient at risk for suicide?: Yes What has been your use of drugs/alcohol within the last 12 months?: Alcohol is the only thing she has used since last May Risk to Others:   Prior Inpatient Therapy:   Prior Outpatient Therapy:    Level of Care:  OP  Hospital Course:  TIENNA BIENKOWSKI was admitted for Alcohol dependence with withdrawal, uncomplicated and crisis management.  She was treated discharged with the medications listed below under Medication List.  Medical problems were identified and treated as needed.  Home medications were restarted as appropriate.  Improvement was monitored by observation and Phillis Haggis daily report of symptom reduction.  Emotional and mental status was monitored by daily self-inventory reports completed by Phillis Haggis and clinical staff.         Phillis Haggis was evaluated by the treatment team for stability and plans for continued recovery upon discharge.  Phillis Haggis motivation was an integral factor for scheduling further treatment.  Employment, transportation, bed availability, health status, family support, and any pending legal issues were also considered during her hospital stay.  She was  offered further treatment options upon discharge including but not limited to Residential, Intensive Outpatient, and Outpatient treatment.  Phillis Haggis will follow up with the services as listed below under Follow Up Information.     Upon completion of this admission the patient was both mentally and medically stable for discharge denying suicidal/homicidal ideation, auditory/visual/tactile hallucinations, delusional thoughts and paranoia.      Consults:  psychiatry  Significant Diagnostic Studies:  labs: ETOH, UDS, CBC/Diff, CMET  Discharge Vitals:   Blood pressure 115/71, pulse 58, temperature 98.8 F (37.1 C), temperature source Oral, resp. rate 20, height _0  (1.753 m), weight 88.451 kg (195 lb), last menstrual period 02/13/2015. Body mass index is 28.78 kg/(m^2). Lab Results:   Results for orders placed or performed during the hospital encounter of 02/17/15 (from the past 72 hour(s))  Comprehensive metabolic panel     Status: Abnormal   Collection Time: 02/21/15  6:30 AM  Result Value Ref Range   Sodium 136 135 - 145 mmol/L   Potassium 4.3 3.5 - 5.1 mmol/L   Chloride 100 (L) 101 - 111 mmol/L   CO2 27 22 - 32 mmol/L   Glucose, Bld 94 65 - 99 mg/dL   BUN 20 6 - 20 mg/dL   Creatinine, Ser 0.80 0.44 - 1.00 mg/dL   Calcium 8.4 (L) 8.9 - 10.3 mg/dL   Total Protein 6.6 6.5 - 8.1 g/dL   Albumin 3.8 3.5 - 5.0 g/dL   AST 46 (H) 15 - 41 U/L   ALT 51 14 - 54 U/L   Alkaline Phosphatase 81 38 - 126 U/L   Total Bilirubin 0.5 0.3 - 1.2 mg/dL   GFR calc non Af Amer >60 >60 mL/min   GFR calc Af Amer >60 >60 mL/min    Comment: (NOTE) The eGFR has been calculated using the CKD EPI equation. This calculation has not been validated in all clinical situations. eGFR's persistently <60 mL/min signify possible Chronic Kidney Disease.    Anion gap 9 5 - 15    Comment: Performed at [facility]  TSH     Status: Abnormal   Collection Time: 02/21/15  7:27 PM  Result Value  Ref Range   TSH 23.607 (H) 0.350 - 4.500 uIU/mL    Comment: Performed at [facility]    Physical Findings: AIMS: Facial and Oral Movements Muscles of Facial Expression: None, normal Lips and Perioral Area: None, normal Jaw: None, normal Tongue: None, normal,Extremity Movements Upper (arms, wrists, hands, fingers): None, normal Lower (legs, knees, ankles, toes): None, normal, Trunk Movements Neck, shoulders, hips: None, normal, Overall Severity Severity of abnormal movements (highest score  from questions above): None, normal Incapacitation due to abnormal movements: None, normal Patient's awareness of abnormal movements (rate only patient's report): No Awareness, Dental Status Current problems with teeth and/or dentures?: No Does patient usually wear dentures?: No  CIWA:  CIWA-Ar Total: 4 COWS:      See Psychiatric Specialty Exam and Suicide Risk Assessment completed by Attending Physician prior to discharge.  Discharge destination:  Home  Is patient on multiple antipsychotic therapies at discharge:  No   Has Patient had three or more failed trials of antipsychotic monotherapy by history:  No    Recommended Plan for Multiple Antipsychotic Therapies: NA      Discharge Instructions    Activity as tolerated - No restrictions    Complete by:  As directed      Diet general    Complete by:  As directed      Discharge instructions    Complete by:  As directed   Take all of you medications as prescribed by your mental healthcare provider.  Report any adverse effects and reactions from your medications to your outpatient provider promptly. Do not engage in alcohol and or illegal drug use while on prescription medicines. In the event of worsening symptoms call the crisis hotline, 911, and or go to the nearest emergency department for appropriate evaluation and treatment of symptoms. Follow-up with your primary care provider for your medical issues, concerns and  or health care needs.   Keep all scheduled appointments.  If you are unable to keep an appointment call to reschedule.  Let the nurse know if you will need medications before next scheduled appointment.            Medication List    STOP taking these medications        carbamazepine 100 MG 12 hr tablet  Commonly known as:  TEGRETOL XR     cephALEXin 500 MG capsule  Commonly known as:  KEFLEX     chlordiazePOXIDE 25 MG capsule  Commonly known as:  LIBRIUM     ciprofloxacin 500 MG tablet  Commonly known as:  CIPRO     clonazePAM 0.5 MG tablet  Commonly known as:  KLONOPIN      TAKE these medications      Indication   amitriptyline 100 MG tablet  Commonly known as:  ELAVIL  Take 1 tablet (100 mg total) by mouth at bedtime.   Indication:  Depression with Excitement and Restlessness     FLUoxetine 20 MG capsule  Commonly known as:  PROZAC  Take 3 capsules (60 mg total) by mouth daily.   Indication:  Manic-Depression, Major Depressive Disorder     folic acid 1 MG tablet  Commonly known as:  FOLVITE  Take 1 tablet (1 mg total) by mouth daily.   Indication:  Deficiency of Folic Acid in the Diet     gabapentin 800 MG tablet  Commonly known as:  NEURONTIN  Take 1 tablet (800 mg total) by mouth 3 (three) times daily.   Indication:  Agitation, Alcohol Withdrawal Syndrome     haloperidol 1 MG tablet  Commonly known as:  HALDOL  Take 1 tablet (1 mg total) by mouth 3 (three) times daily.   Indication:  mood stabilization     ibuprofen 200 MG tablet  Commonly known as:  ADVIL,MOTRIN  Take 600 mg by mouth every 6 (six) hours as needed for moderate pain.      lactulose 10 GM/15ML solution  Commonly known as:  CHRONULAC  Take 30 mLs (20 g total) by mouth 2 (two) times daily as needed for mild constipation, moderate constipation or severe constipation.   Indication:  constipation     levothyroxine 150 MCG tablet  Commonly known as:  SYNTHROID, LEVOTHROID  Take 1 tablet  (150 mcg total) by mouth daily before breakfast.   Indication:  Underactive Thyroid     lithium carbonate 150 MG capsule  Take 1 capsule (150 mg total) by mouth 2 (two) times daily with a meal.   Indication:  mood stabilization     metroNIDAZOLE 500 MG tablet  Commonly known as:  FLAGYL  Take 1 tablet (500 mg total) by mouth every 12 (twelve) hours.   Indication:  Vaginosis caused by Bacteria     multivitamin with minerals Tabs tablet  Take 1 tablet by mouth daily.   Indication:  Nutritional Support     omeprazole 40 MG capsule  Commonly known as:  PRILOSEC  Take 1 capsule (40 mg total) by mouth daily.   Indication:  Gastroesophageal Reflux Disease       Follow-up Information    Follow up with [facility] On 03/18/2015.   Why:  Appt on this date at 2:00PM to meet with financial counselor regarding orange card.    Contact information:   [address] [place], [place] [address] Phone: [phone] Fax: [phone]      Follow up with [facility]  On 02/24/2015.   Why:  Appt on this date at 10:15AM with Dr. Franchot Mimes.    Contact information:   [place], [place] [address] Phone: [phone] Fax: [phone]      Follow up with [facility]  On 03/10/2015.   Why:  Appt on this date at 9:00AM with Ms. Young for therapy.    Contact information:   [place], [place] [address] Phone: [phone] Fax: [phone]      Follow-up recommendations:  Activity:  As tolerated Diet:  As tolerated  Comments:   Patient has been instructed to take medications as prescribed; and report adverse effects to outpatient provider.  Follow up with primary doctor for any medical issues and If symptoms recur report to nearest emergency or crisis hot line.    Total Discharge Time: Greater than 30 minutes  Signed: Earleen Newport, FNP-BC 02/23/2015, 1:00 PM  I personally assessed the patient and formulated the  plan Geralyn Flash A. Sabra Heck, M.D.

## 2015-02-23 NOTE — Progress Notes (Signed)
  [facility] Adult Case Management Discharge Plan :  Will you be returning to the same living situation after discharge:  Yes,  patient plans to return home At discharge, do you have transportation home?: Yes,  patient reports access to transportation Do you have the ability to pay for your medications: Yes,  patient will be provided with medication samples and prescriptions  Release of information consent forms completed and in the chart;  Patient's signature needed at discharge.  Patient to Follow up at: Follow-up Information    Follow up with [facility] On 03/18/2015.   Why:  Appt on this date at 2:00PM to meet with financial counselor regarding orange card.    Contact information:   [address] [place], [place] [address] Phone: [phone] Fax: [phone]      Follow up with [facility]  On 02/24/2015.   Why:  Appt on this date at 10:15AM with Dr. Franchot Mimes.    Contact information:   [place], [place] [address] Phone: [phone] Fax: [phone]      Follow up with [facility]  On 03/10/2015.   Why:  Appt on this date at 9:00AM with Ms. Young for therapy.    Contact information:   [place], [place] [address] Phone: [phone] Fax: [phone]      Patient denies SI/HI: Yes,  denies    Safety Planning and Suicide Prevention discussed: Yes,  with patient  Have you used any form of tobacco in the last 30 days? (Cigarettes, Smokeless Tobacco, Cigars, and/or Pipes): Yes  Has patient been referred to the Quitline?: Patient refused referral  Cathie Bonnell, Casimiro Needle 02/23/2015, 11:15 AM

## 2015-03-12 ENCOUNTER — Emergency Department (HOSPITAL_COMMUNITY)
Admission: EM | Admit: 2015-03-12 | Discharge: 2015-03-12 | Disposition: A | Discharge status: Home / Self Care | Payer: Self-pay | Attending: Emergency Medicine | Admitting: Emergency Medicine

## 2015-03-12 ENCOUNTER — Encounter (HOSPITAL_COMMUNITY): Payer: Self-pay | Admitting: *Deleted

## 2015-03-12 DIAGNOSIS — Z79899 Other long term (current) drug therapy: Secondary | ICD-10-CM | POA: Insufficient documentation

## 2015-03-12 DIAGNOSIS — F419 Anxiety disorder, unspecified: Secondary | ICD-10-CM | POA: Insufficient documentation

## 2015-03-12 DIAGNOSIS — R609 Edema, unspecified: Secondary | ICD-10-CM | POA: Insufficient documentation

## 2015-03-12 DIAGNOSIS — Z862 Personal history of diseases of the blood and blood-forming organs and certain disorders involving the immune mechanism: Secondary | ICD-10-CM | POA: Insufficient documentation

## 2015-03-12 DIAGNOSIS — F329 Major depressive disorder, single episode, unspecified: Secondary | ICD-10-CM | POA: Insufficient documentation

## 2015-03-12 DIAGNOSIS — R600 Localized edema: Secondary | ICD-10-CM

## 2015-03-12 DIAGNOSIS — Z720 Tobacco use: Secondary | ICD-10-CM | POA: Insufficient documentation

## 2015-03-12 DIAGNOSIS — G40909 Epilepsy, unspecified, not intractable, without status epilepticus: Secondary | ICD-10-CM | POA: Insufficient documentation

## 2015-03-12 DIAGNOSIS — E039 Hypothyroidism, unspecified: Secondary | ICD-10-CM | POA: Insufficient documentation

## 2015-03-12 DIAGNOSIS — Z8711 Personal history of peptic ulcer disease: Secondary | ICD-10-CM | POA: Insufficient documentation

## 2015-03-12 LAB — CBC WITH DIFFERENTIAL/PLATELET
Basophils Absolute: 0.1 10*3/uL (ref 0.0–0.1)
Basophils Relative: 1 % (ref 0–1)
Eosinophils Absolute: 0.5 10*3/uL (ref 0.0–0.7)
Eosinophils Relative: 7 % — ABNORMAL HIGH (ref 0–5)
HCT: 32.1 % — ABNORMAL LOW (ref 36.0–46.0)
HEMOGLOBIN: 10.6 g/dL — AB (ref 12.0–15.0)
LYMPHS ABS: 1.1 10*3/uL (ref 0.7–4.0)
Lymphocytes Relative: 14 % (ref 12–46)
MCH: 32.8 pg (ref 26.0–34.0)
MCHC: 33 g/dL (ref 30.0–36.0)
MCV: 99.4 fL (ref 78.0–100.0)
MONO ABS: 1.2 10*3/uL — AB (ref 0.1–1.0)
Monocytes Relative: 16 % — ABNORMAL HIGH (ref 3–12)
Neutro Abs: 4.9 10*3/uL (ref 1.7–7.7)
Neutrophils Relative %: 62 % (ref 43–77)
PLATELETS: 466 10*3/uL — AB (ref 150–400)
RBC: 3.23 MIL/uL — ABNORMAL LOW (ref 3.87–5.11)
RDW: 15.8 % — ABNORMAL HIGH (ref 11.5–15.5)
WBC: 7.7 10*3/uL (ref 4.0–10.5)

## 2015-03-12 LAB — URINALYSIS, ROUTINE W REFLEX MICROSCOPIC
Bilirubin Urine: NEGATIVE
GLUCOSE, UA: NEGATIVE mg/dL
Hgb urine dipstick: NEGATIVE
Ketones, ur: NEGATIVE mg/dL
Leukocytes, UA: NEGATIVE
Nitrite: NEGATIVE
PH: 7 (ref 5.0–8.0)
Protein, ur: NEGATIVE mg/dL
Specific Gravity, Urine: <1.005 — ABNORMAL LOW (ref 1.005–1.030)
UROBILINOGEN UA: 0.2 mg/dL (ref 0.0–1.0)

## 2015-03-12 LAB — COMPREHENSIVE METABOLIC PANEL
ALT: 32 U/L (ref 14–54)
AST: 33 U/L (ref 15–41)
Albumin: 3.7 g/dL (ref 3.5–5.0)
Alkaline Phosphatase: 73 U/L (ref 38–126)
Anion gap: 8 (ref 5–15)
BILIRUBIN TOTAL: 0.7 mg/dL (ref 0.3–1.2)
BUN: 9 mg/dL (ref 6–20)
CALCIUM: 8.3 mg/dL — AB (ref 8.9–10.3)
CO2: 26 mmol/L (ref 22–32)
CREATININE: 0.93 mg/dL (ref 0.44–1.00)
Chloride: 96 mmol/L — ABNORMAL LOW (ref 101–111)
GFR calc Af Amer: >60 mL/min (ref >60)
GFR calc non Af Amer: >60 mL/min (ref >60)
Glucose, Bld: 94 mg/dL (ref 65–99)
POTASSIUM: 3.7 mmol/L (ref 3.5–5.1)
SODIUM: 130 mmol/L — AB (ref 135–145)
Total Protein: 6.3 g/dL — ABNORMAL LOW (ref 6.5–8.1)

## 2015-03-12 MED ORDER — TRAMADOL HCL 50 MG PO TABS: 50.0000 mg | ORAL_TABLET | Freq: Four times a day (QID) | ORAL | Status: DC | PRN

## 2015-03-12 MED ORDER — GABAPENTIN 800 MG PO TABS: 400.0000 mg | ORAL_TABLET | Freq: Three times a day (TID) | ORAL | Status: DC

## 2015-03-12 NOTE — ED Notes (Signed)
Pt has +2 pitting edema to bilateral lower legs.

## 2015-03-12 NOTE — ED Notes (Signed)
Pt states bilateral swelling to lower extremities x ~3 days. States she took a friends 40mg  Lasix with no relief. Denies SOB.

## 2015-03-12 NOTE — ED Provider Notes (Signed)
CSN: [id number]     Arrival date & time 03/12/15  1723 History   First MD Initiated Contact with Patient 03/12/15 1733     Chief Complaint  Patient presents with  . Leg Swelling     (Consider location/radiation/quality/duration/timing/severity/associated sxs/prior Treatment) HPI Patient presents with concern of bilateral lower extremity swelling. The time of onset is unclear, though it seems to be have been more severe over the past 3 or 4 days. There is no pain, but there is discomfort.  No loss of sensation, no asymmetry. Patient also describes diffuse swelling, though not as pronounced as in the lower extremities. Patient states that she has had similar swelling previously with gabapentin, and recently started this medication again. Currently she denies any chest pain, belly pain, nausea, vomiting, fever, chills.   Past Medical History  Diagnosis Date  . Depression   . Peptic ulcer   . Alcohol abuse   . Anemia   . Benzodiazepine abuse   . Thyroid disease   . Alcoholism   . Narcotic abuse   . Back pain   . Thrombocytopenia 06/17/2011  . Hypothyroidism   . Seizures   . Hypoglycemia   . Suicide attempt     multiple times   Past Surgical History  Procedure Laterality Date  . Cholecystectomy    . Abdominal surgery    . Esophagogastroduodenoscopy    . Gastric bypass    . Tubal ligation    . Esophagogastroduodenoscopy N/A 03/23/2013    Procedure: ESOPHAGOGASTRODUODENOSCOPY (EGD);  Surgeon: Ladene Artist, MD;  Location: [facility];  Service: Endoscopy;  Laterality: N/A;   Family History  Problem Relation Age of Onset  . Alcohol abuse Father   . Alcoholism Father   . Cancer Other    History  Substance Use Topics  . Smoking status: Current Every Day Smoker -- 1.00 packs/day for 12 years    Types: Cigarettes    Last Attempt to Quit: 08/08/2011  . Smokeless tobacco: Never Used  . Alcohol Use: Yes     Comment: drink 4 liters a day. 1 case/day drinks Listerine/ 21  days sober today 03/12/15, per pt   OB History    Gravida Para Term Preterm AB TAB SAB Ectopic Multiple Living   3 3  3      3      Review of Systems  Constitutional:       Per HPI, otherwise negative  HENT:       Per HPI, otherwise negative  Respiratory:       Per HPI, otherwise negative  Cardiovascular:       Per HPI, otherwise negative  Gastrointestinal: Negative for vomiting.  Endocrine:       Negative aside from HPI  Genitourinary:       Neg aside from HPI   Musculoskeletal:       Per HPI, otherwise negative  Skin: Negative.   Allergic/Immunologic: Negative for immunocompromised state.  Neurological: Negative for syncope.  Psychiatric/Behavioral:       Anxiety      Allergies  Nsaids  Home Medications   Prior to Admission medications   Medication Sig Start Date End Date Taking? Authorizing Provider  amitriptyline (ELAVIL) 100 MG tablet Take 1 tablet (100 mg total) by mouth at bedtime. 02/23/15  Yes [name], NP  clonazePAM (KLONOPIN) 0.5 MG tablet Take 0.5 mg by mouth daily.    Yes Historical Provider, MD  FLUoxetine (PROZAC) 20 MG capsule Take 3 capsules (60 mg  total) by mouth daily. 02/23/15  Yes [name], NP  gabapentin (NEURONTIN) 800 MG tablet Take 1 tablet (800 mg total) by mouth 3 (three) times daily. 02/23/15  Yes [name], NP  levothyroxine (SYNTHROID, LEVOTHROID) 150 MCG tablet Take 1 tablet (150 mcg total) by mouth daily before breakfast. 02/23/15  Yes [name], NP  lithium carbonate 150 MG capsule Take 1 capsule (150 mg total) by mouth 2 (two) times daily with a meal. 02/23/15  Yes [name], NP  omeprazole (PRILOSEC) 40 MG capsule Take 1 capsule (40 mg total) by mouth daily. 02/23/15  Yes [name], NP  folic acid (FOLVITE) 1 MG tablet Take 1 tablet (1 mg total) by mouth daily. Patient not taking: Reported on 03/12/2015 02/23/15   [name], NP  haloperidol (HALDOL) 1 MG tablet Take 1 tablet (1 mg total) by mouth 3  (three) times daily. Patient not taking: Reported on 03/12/2015 02/23/15   [name], NP  lactulose (CHRONULAC) 10 GM/15ML solution Take 30 mLs (20 g total) by mouth 2 (two) times daily as needed for mild constipation, moderate constipation or severe constipation. Patient not taking: Reported on 03/12/2015 02/23/15   [name], NP  Multiple Vitamin (MULTIVITAMIN WITH MINERALS) TABS tablet Take 1 tablet by mouth daily. Patient not taking: Reported on 03/12/2015 02/23/15   [name], NP   BP 126/65 mmHg  Pulse 99  Temp(Src) 99 F (37.2 C) (Oral)  Resp 22  SpO2 99%  LMP 02/13/2015 Physical Exam  Constitutional: She is oriented to person, place, and time. She appears well-developed and well-nourished. No distress.  HENT:  Head: Normocephalic and atraumatic.  Eyes: Conjunctivae and EOM are normal.  Cardiovascular: Normal rate and regular rhythm.   Pulmonary/Chest: Effort normal and breath sounds normal. No stridor. No respiratory distress.  Abdominal: She exhibits no distension.  Musculoskeletal: She exhibits no edema.  Diffuse, bilateral symmetric non-pitting edema.  Neurological: She is alert and oriented to person, place, and time. No cranial nerve deficit.  Skin: Skin is warm and dry.  Psychiatric: Her mood appears anxious.  Nursing note and vitals reviewed.   ED Course  Procedures (including critical care time) Labs Review Labs Reviewed  URINALYSIS, ROUTINE W REFLEX MICROSCOPIC (NOT AT [facility]) - Abnormal; Notable for the following:    Specific Gravity, Urine <1.005 (*)    All other components within normal limits  COMPREHENSIVE METABOLIC PANEL - Abnormal; Notable for the following:    Sodium 130 (*)    Chloride 96 (*)    Calcium 8.3 (*)    Total Protein 6.3 (*)    All other components within normal limits  CBC WITH DIFFERENTIAL/PLATELET - Abnormal; Notable for the following:    RBC 3.23 (*)    Hemoglobin 10.6 (*)    HCT 32.1 (*)    RDW 15.8 (*)    Platelets 466  (*)    Monocytes Relative 16 (*)    Monocytes Absolute 1.2 (*)    Eosinophils Relative 7 (*)    All other components within normal limits     8:02 PM Patient awake, alert, in no distress. No additional complaints. We had a lengthy conversation about all findings, possibility of medication interaction versus side effect for her edema. Patient has scheduled follow-up in 8 days at primary care clinic. We will adjust her medication, for trial of pain control, and she'll follow-up then to ensure improvement. MDM  Patient presents with concern of  bilateral lower extremity edema, no new dyspnea, chest pain, fever, chills, cough. Patient's evaluation here is largely reassuring, with low suspicion for heart failure, no evidence for acute renal dysfunction, hepatic dysfunction. Patient has recently started medication, which is consistent with prior episodes of edema, and this is a consideration. Patient had medication adjustments, will follow-up with primary care.   [name], MD 03/12/15 2004

## 2015-03-12 NOTE — Discharge Instructions (Signed)
As discussed, your evaluation today has been largely reassuring.  But, it is important that you monitor your condition carefully, and do not hesitate to return to the ED if you develop new, or concerning changes in your condition. ? ?Otherwise, please follow-up with your physician for appropriate ongoing care. ? ?

## 2015-03-18 ENCOUNTER — Ambulatory Visit: Payer: Self-pay

## 2015-03-24 ENCOUNTER — Other Ambulatory Visit (HOSPITAL_COMMUNITY): Payer: Self-pay | Admitting: Physician Assistant

## 2015-03-24 DIAGNOSIS — Z1231 Encounter for screening mammogram for malignant neoplasm of breast: Secondary | ICD-10-CM

## 2015-03-31 DIAGNOSIS — F1590 Other stimulant use, unspecified, uncomplicated: Secondary | ICD-10-CM | POA: Insufficient documentation

## 2015-04-07 ENCOUNTER — Ambulatory Visit (HOSPITAL_COMMUNITY): Payer: Self-pay

## 2015-05-01 ENCOUNTER — Emergency Department (HOSPITAL_COMMUNITY): Payer: Self-pay

## 2015-05-01 ENCOUNTER — Emergency Department (HOSPITAL_COMMUNITY)
Admission: EM | Admit: 2015-05-01 | Discharge: 2015-05-01 | Discharge status: Against Medical Advice | Payer: Self-pay | Attending: Emergency Medicine | Admitting: Emergency Medicine

## 2015-05-01 ENCOUNTER — Encounter (HOSPITAL_COMMUNITY): Payer: Self-pay | Admitting: *Deleted

## 2015-05-01 DIAGNOSIS — Y9389 Activity, other specified: Secondary | ICD-10-CM | POA: Insufficient documentation

## 2015-05-01 DIAGNOSIS — F1020 Alcohol dependence, uncomplicated: Secondary | ICD-10-CM

## 2015-05-01 DIAGNOSIS — Y9289 Other specified places as the place of occurrence of the external cause: Secondary | ICD-10-CM | POA: Insufficient documentation

## 2015-05-01 DIAGNOSIS — G40909 Epilepsy, unspecified, not intractable, without status epilepticus: Secondary | ICD-10-CM | POA: Insufficient documentation

## 2015-05-01 DIAGNOSIS — S0511XA Contusion of eyeball and orbital tissues, right eye, initial encounter: Secondary | ICD-10-CM | POA: Insufficient documentation

## 2015-05-01 DIAGNOSIS — Z720 Tobacco use: Secondary | ICD-10-CM | POA: Insufficient documentation

## 2015-05-01 DIAGNOSIS — F329 Major depressive disorder, single episode, unspecified: Secondary | ICD-10-CM | POA: Insufficient documentation

## 2015-05-01 DIAGNOSIS — Z79899 Other long term (current) drug therapy: Secondary | ICD-10-CM | POA: Insufficient documentation

## 2015-05-01 DIAGNOSIS — D649 Anemia, unspecified: Secondary | ICD-10-CM | POA: Insufficient documentation

## 2015-05-01 DIAGNOSIS — Y998 Other external cause status: Secondary | ICD-10-CM | POA: Insufficient documentation

## 2015-05-01 DIAGNOSIS — Z915 Personal history of self-harm: Secondary | ICD-10-CM | POA: Insufficient documentation

## 2015-05-01 DIAGNOSIS — Z8711 Personal history of peptic ulcer disease: Secondary | ICD-10-CM | POA: Insufficient documentation

## 2015-05-01 DIAGNOSIS — F1110 Opioid abuse, uncomplicated: Secondary | ICD-10-CM | POA: Insufficient documentation

## 2015-05-01 DIAGNOSIS — S0083XA Contusion of other part of head, initial encounter: Secondary | ICD-10-CM | POA: Insufficient documentation

## 2015-05-01 DIAGNOSIS — F10229 Alcohol dependence with intoxication, unspecified: Secondary | ICD-10-CM | POA: Insufficient documentation

## 2015-05-01 DIAGNOSIS — F10929 Alcohol use, unspecified with intoxication, unspecified: Secondary | ICD-10-CM

## 2015-05-01 DIAGNOSIS — E039 Hypothyroidism, unspecified: Secondary | ICD-10-CM | POA: Insufficient documentation

## 2015-05-01 DIAGNOSIS — F1310 Sedative, hypnotic or anxiolytic abuse, uncomplicated: Secondary | ICD-10-CM | POA: Insufficient documentation
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
## 2015-05-01 NOTE — ED Notes (Signed)
Pt left ambulatory without difficulty.  
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
## 2015-05-01 NOTE — ED Notes (Signed)
Pt unable to give urine specimen right now.  Pt in room drinking hand sanitizer.  Advised pt to leave curtain  Open.

## 2015-05-01 NOTE — Discharge Instructions (Signed)
Behavioral Health Resources in the Community ° °Intensive Outpatient Programs: °[facility]      °[address] °[place], [place] °[phone] °Both a day and evening program °      °[facility]     °[address]        °[place], [place] [address] °[phone]        ° °[facility] °[address] °[place] °[phone] ° °[facility] °ACCESS LINE: 1-800-853-5163 or 336-641-4981 °[address] °[place], [place] [address] °[email or web address] ° °Mobile Crisis Teams:         °                               °[facility]         °Mobile Crisis Care Unit °1-877-626-1772       °      °Assertive °[facility] °[address] [place] °[phone] °                                        °[facility] °Sharon DeEsch °[address] °[place] °[phone] ° °Self-Help/Support Groups: °[facility] Variety of support groups °[phone] (call for more info) ° °Narcotics Anonymous (NA) °[facility] °[address] °[place] - 2 meetings at this location ° °Residential Treatment Programs:  °[facility]      °[address]        °[place]       °[phone]        ° °[facility] °[address] °[place], [place]  [address] °[phone] ° °[facility]  °[address] °[place], [place] [address] °[phone] °Admissions: 8am-3pm M-F ° °[facility]     °[address]        °[place], [place] [address]       °[phone]        ° °[facility] °[address] °[place], [place] °[phone] ° °[facility] °[address] °[place], [place] °[phone] ° °[facility]      °[address]     °[place], [place]       °[phone]        ° °[facility]        °[address] °[place], [place] °[phone] or [phone] ° °[facility]  °[address] °[place],  °[phone] ° °[facility]                                               °[address] °[place]  °[phone] ° °[facility]: °[facility]- 1-888-581-9988              ° °General Therapy                                                °Julie Brannon, PhD        °  [address]                                       °[place], [place] [address]         °[phone]   °Insurance ° °[facility]   °[address] °[place], [place] [address] °336-349-4454 ° °[facility] °[address] [place], [place] [address] °[phone] °Insurance/Medicaid/sponsorship through [facility] ° °[facility]                                              °[address]                                        °[place], [place] [address]    °Therapy/tele-psych/case         °[phone]        °  °[facility] °[address]  ° , [place]  [address]  °Adolescent/group home/case management °[phone]  °                                         °Julia Brannon PhD       °General therapy       °Insurance   °[phone]        ° °Dr. Arfeen °Insurance °336- [phone] °M-F ° °[facility] °Medicaid, sponsorship °[phone] ° ° ° °

## 2015-05-01 NOTE — ED Notes (Signed)
Pt states "I'm here for detox". Pt intoxicated. Gait very unsteady. Pt sweating profusely around neck area.

## 2015-05-01 NOTE — ED Notes (Signed)
Pt uncooperative with pharmacy.  Told pharmacy tech "to get out"
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
## 2015-07-07 ENCOUNTER — Emergency Department (HOSPITAL_COMMUNITY)
Admission: EM | Admit: 2015-07-07 | Discharge: 2015-07-07 | Disposition: A | Discharge status: Home / Self Care | Payer: Self-pay | Attending: Emergency Medicine | Admitting: Emergency Medicine

## 2015-07-07 ENCOUNTER — Emergency Department (HOSPITAL_COMMUNITY): Payer: Self-pay

## 2015-07-07 ENCOUNTER — Encounter (HOSPITAL_COMMUNITY): Payer: Self-pay

## 2015-07-07 DIAGNOSIS — G40909 Epilepsy, unspecified, not intractable, without status epilepticus: Secondary | ICD-10-CM | POA: Insufficient documentation

## 2015-07-07 DIAGNOSIS — Z8711 Personal history of peptic ulcer disease: Secondary | ICD-10-CM | POA: Insufficient documentation

## 2015-07-07 DIAGNOSIS — Z720 Tobacco use: Secondary | ICD-10-CM | POA: Insufficient documentation

## 2015-07-07 DIAGNOSIS — F329 Major depressive disorder, single episode, unspecified: Secondary | ICD-10-CM | POA: Insufficient documentation

## 2015-07-07 DIAGNOSIS — Y9302 Activity, running: Secondary | ICD-10-CM | POA: Insufficient documentation

## 2015-07-07 DIAGNOSIS — Y9289 Other specified places as the place of occurrence of the external cause: Secondary | ICD-10-CM | POA: Insufficient documentation

## 2015-07-07 DIAGNOSIS — E039 Hypothyroidism, unspecified: Secondary | ICD-10-CM | POA: Insufficient documentation

## 2015-07-07 DIAGNOSIS — X58XXXA Exposure to other specified factors, initial encounter: Secondary | ICD-10-CM | POA: Insufficient documentation

## 2015-07-07 DIAGNOSIS — Y998 Other external cause status: Secondary | ICD-10-CM | POA: Insufficient documentation

## 2015-07-07 DIAGNOSIS — Z862 Personal history of diseases of the blood and blood-forming organs and certain disorders involving the immune mechanism: Secondary | ICD-10-CM | POA: Insufficient documentation

## 2015-07-07 DIAGNOSIS — S93401A Sprain of unspecified ligament of right ankle, initial encounter: Secondary | ICD-10-CM | POA: Insufficient documentation

## 2015-07-07 DIAGNOSIS — Z79899 Other long term (current) drug therapy: Secondary | ICD-10-CM | POA: Insufficient documentation

## 2015-07-07 MED ORDER — TRAMADOL HCL 50 MG PO TABS: 50.0000 mg | ORAL_TABLET | Freq: Four times a day (QID) | ORAL | Status: DC | PRN

## 2015-07-07 NOTE — ED Notes (Signed)
Pt states that right ankle started hurting while jogging 3 weeks ago and has not got better.

## 2015-07-07 NOTE — Discharge Instructions (Signed)
Ankle Sprain  An ankle sprain is an injury to the strong, fibrous tissues (ligaments) that hold your ankle bones together.   HOME CARE   · Put ice on your ankle for 1-2 days or as told by your doctor.  ¨ Put ice in a plastic bag.  ¨ Place a towel between your skin and the bag.  ¨ Leave the ice on for 15-20 minutes at a time, every 2 hours while you are awake.  · Only take medicine as told by your doctor.  · Raise (elevate) your injured ankle above the level of your heart as much as possible for 2-3 days.  · Use crutches if your doctor tells you to. Slowly put your own weight on the affected ankle. Use the crutches until you can walk without pain.  · If you have a plaster splint:  ¨ Do not rest it on anything harder than a pillow for 24 hours.  ¨ Do not put weight on it.  ¨ Do not get it wet.  ¨ Take it off to shower or bathe.  · If given, use an elastic wrap or support stocking for support. Take the wrap off if your toes lose feeling (numb), tingle, or turn cold or blue.  · If you have an air splint:  ¨ Add or let out air to make it comfortable.  ¨ Take it off at night and to shower and bathe.  ¨ Wiggle your toes and move your ankle up and down often while you are wearing it.  GET HELP IF:  · You have rapidly increasing bruising or puffiness (swelling).  · Your toes feel very cold.  · You lose feeling in your foot.  · Your medicine does not help your pain.  GET HELP RIGHT AWAY IF:   · Your toes lose feeling (numb) or turn blue.  · You have severe pain that is increasing.  MAKE SURE YOU:   · Understand these instructions.  · Will watch your condition.  · Will get help right away if you are not doing well or get worse.  Document Released: 03/12/2008 Document Revised: 02/08/2014 Document Reviewed: 04/07/2012  Exit[employer]® Patient Information ©2015 [employer]. This information is not intended to replace advice given to you by your health care provider. Make sure you discuss any questions you have with your health care  provider.

## 2015-07-07 NOTE — ED Provider Notes (Signed)
CSN: [id number]     Arrival date & time 07/07/15  1519 History   First MD Initiated Contact with Patient 07/07/15 1609     Chief Complaint  Patient presents with  . Ankle Injury    right     (Consider location/radiation/quality/duration/timing/severity/associated sxs/prior Treatment) HPI   Jacqueline [name] is a 49 y.o. female who presents to the Emergency Department complaining of right ankle pain for 3 weeks.  She states that she developed pain to her lateral ankle after jogging.  She states the pain has not improved and she now has to walk on her heel and inner foot.  Pain worse with weight bearing, improves with rest.  She denies fall, swelling, bruising, and pain to her lower leg.  She has not tried any therapies for her symptoms   Past Medical History  Diagnosis Date  . Depression   . Peptic ulcer   . Alcohol abuse   . Anemia   . Benzodiazepine abuse   . Thyroid disease   . Alcoholism   . Narcotic abuse   . Back pain   . Thrombocytopenia 06/17/2011  . Hypothyroidism   . Seizures   . Hypoglycemia   . Suicide attempt     multiple times   Past Surgical History  Procedure Laterality Date  . Cholecystectomy    . Abdominal surgery    . Esophagogastroduodenoscopy    . Gastric bypass    . Tubal ligation    . Esophagogastroduodenoscopy N/A 03/23/2013    Procedure: ESOPHAGOGASTRODUODENOSCOPY (EGD);  Surgeon: Ladene Artist, MD;  Location: [facility];  Service: Endoscopy;  Laterality: N/A;   Family History  Problem Relation Age of Onset  . Alcohol abuse Father   . Alcoholism Father   . Cancer Other    Social History  Substance Use Topics  . Smoking status: Current Every Day Smoker -- 1.00 packs/day for 12 years    Types: Cigarettes    Last Attempt to Quit: 08/08/2011  . Smokeless tobacco: Never Used  . Alcohol Use: No     Comment: drink 4 liters a day. 1 case/day drinks Listerine/ 21 days sober today 03/12/15, per pt   OB History    Gravida Para Term Preterm AB  TAB SAB Ectopic Multiple Living   3 3  3      3      Review of Systems  Constitutional: Negative for fever and chills.  Genitourinary: Negative for dysuria and difficulty urinating.  Musculoskeletal: Positive for arthralgias. Negative for joint swelling.  Skin: Negative for color change and wound.  Neurological: Negative for numbness.  All other systems reviewed and are negative.     Allergies  Nsaids  Home Medications   Prior to Admission medications   Medication Sig Start Date End Date Taking? Authorizing Jaelen Soth  amitriptyline (ELAVIL) 100 MG tablet Take 1 tablet (100 mg total) by mouth at bedtime. 02/23/15  Yes [name], NP  clonazePAM (KLONOPIN) 0.5 MG tablet Take 0.5 mg by mouth daily.    Yes Historical Devlin Mcveigh, MD  FLUoxetine (PROZAC) 20 MG capsule Take 3 capsules (60 mg total) by mouth daily. 02/23/15  Yes [name], NP  gabapentin (NEURONTIN) 800 MG tablet Take 0.5 tablets (400 mg total) by mouth 3 (three) times daily. 03/12/15  Yes [name], MD  levothyroxine (SYNTHROID, LEVOTHROID) 150 MCG tablet Take 1 tablet (150 mcg total) by mouth daily before breakfast. 02/23/15  Yes [name], NP  lithium carbonate 150 MG capsule  Take 1 capsule (150 mg total) by mouth 2 (two) times daily with a meal. 02/23/15  Yes [name], NP  omeprazole (PRILOSEC) 40 MG capsule Take 1 capsule (40 mg total) by mouth daily. 02/23/15  Yes [name], NP  folic acid (FOLVITE) 1 MG tablet Take 1 tablet (1 mg total) by mouth daily. Patient not taking: Reported on 03/12/2015 02/23/15   [name], NP  haloperidol (HALDOL) 1 MG tablet Take 1 tablet (1 mg total) by mouth 3 (three) times daily. Patient not taking: Reported on 03/12/2015 02/23/15   [name], NP  lactulose (CHRONULAC) 10 GM/15ML solution Take 30 mLs (20 g total) by mouth 2 (two) times daily as needed for mild constipation, moderate constipation or severe constipation. Patient not taking: Reported on  03/12/2015 02/23/15   [name], NP  Multiple Vitamin (MULTIVITAMIN WITH MINERALS) TABS tablet Take 1 tablet by mouth daily. Patient not taking: Reported on 03/12/2015 02/23/15   [name], NP  traMADol (ULTRAM) 50 MG tablet Take 1 tablet (50 mg total) by mouth every 6 (six) hours as needed. Patient not taking: Reported on 07/07/2015 03/12/15   [name], MD   BP 119/60 mmHg  Pulse 79  Temp(Src) 98.2 F (36.8 C) (Oral)  Resp 18  Ht 5\' 9"  (1.753 m)  Wt 246 lb (111.585 kg)  BMI 36.31 kg/m2  SpO2 100%  LMP 06/29/2015 (Approximate) Physical Exam  Constitutional: She is oriented to person, place, and time. She appears well-developed and well-nourished. No distress.  HENT:  Head: Normocephalic and atraumatic.  Cardiovascular: Normal rate, regular rhythm, normal heart sounds and intact distal pulses.   Pulmonary/Chest: Effort normal and breath sounds normal.  Musculoskeletal: She exhibits tenderness. She exhibits no edema.  ttp of the anterior and lateral right ankle  ROM is preserved.  DP pulse is brisk,distal sensation intact.  No edema, erythema, abrasion, bruising or bony deformity.  No proximal tenderness.  Compartments soft  Neurological: She is alert and oriented to person, place, and time. She exhibits normal muscle tone. Coordination normal.  Skin: Skin is warm and dry.  Nursing note and vitals reviewed.   ED Course  Procedures (including critical care time) Labs Review Labs Reviewed - No data to display  Imaging Review Dg Ankle Complete Right  07/07/2015   CLINICAL DATA:  Running injury. RIGHT ankle pain. Initial encounter.  EXAM: RIGHT ANKLE - COMPLETE 3+ VIEW  COMPARISON:  None.  FINDINGS: There is no evidence of fracture, dislocation, or joint effusion. There is no evidence of arthropathy or other focal bone abnormality. Soft tissues are unremarkable.  IMPRESSION: Negative.   Electronically Signed   By: Dereck Ligas M.D.   On: 07/07/2015 16:07   I have  personally reviewed and evaluated these images and lab results as part of my medical decision-making.   EKG Interpretation None      MDM   Final diagnoses:  Ankle sprain, right, initial encounter    Pt reviewed on the [place] narcotic database.  Pt received #90 clonazepam on 06/30/15, no recent narcotics  ASO splint applied, pain improved, remains NV intact.  Pt advised to elevate, ice and f/u with PMD or orthopedics in one week for recheck   Kem Parkinson, PA-C 07/08/15 [place], MD 07/10/15 [phone]

## 2015-08-04 ENCOUNTER — Emergency Department (HOSPITAL_COMMUNITY)
Admission: EM | Admit: 2015-08-04 | Discharge: 2015-08-05 | Disposition: A | Discharge status: Home / Self Care | Payer: Self-pay | Attending: Emergency Medicine | Admitting: Emergency Medicine

## 2015-08-04 ENCOUNTER — Encounter (HOSPITAL_COMMUNITY): Payer: Self-pay | Admitting: Emergency Medicine

## 2015-08-04 DIAGNOSIS — Z720 Tobacco use: Secondary | ICD-10-CM | POA: Insufficient documentation

## 2015-08-04 DIAGNOSIS — Z79899 Other long term (current) drug therapy: Secondary | ICD-10-CM | POA: Insufficient documentation

## 2015-08-04 DIAGNOSIS — F329 Major depressive disorder, single episode, unspecified: Secondary | ICD-10-CM | POA: Insufficient documentation

## 2015-08-04 DIAGNOSIS — Z8711 Personal history of peptic ulcer disease: Secondary | ICD-10-CM | POA: Insufficient documentation

## 2015-08-04 DIAGNOSIS — F1310 Sedative, hypnotic or anxiolytic abuse, uncomplicated: Secondary | ICD-10-CM | POA: Insufficient documentation

## 2015-08-04 DIAGNOSIS — F10929 Alcohol use, unspecified with intoxication, unspecified: Secondary | ICD-10-CM

## 2015-08-04 DIAGNOSIS — F1994 Other psychoactive substance use, unspecified with psychoactive substance-induced mood disorder: Secondary | ICD-10-CM | POA: Diagnosis present

## 2015-08-04 DIAGNOSIS — Z862 Personal history of diseases of the blood and blood-forming organs and certain disorders involving the immune mechanism: Secondary | ICD-10-CM | POA: Insufficient documentation

## 2015-08-04 DIAGNOSIS — F332 Major depressive disorder, recurrent severe without psychotic features: Secondary | ICD-10-CM | POA: Diagnosis present

## 2015-08-04 DIAGNOSIS — F10129 Alcohol abuse with intoxication, unspecified: Secondary | ICD-10-CM | POA: Insufficient documentation

## 2015-08-04 DIAGNOSIS — R45851 Suicidal ideations: Secondary | ICD-10-CM | POA: Insufficient documentation

## 2015-08-04 DIAGNOSIS — E039 Hypothyroidism, unspecified: Secondary | ICD-10-CM | POA: Insufficient documentation

## 2015-08-04 DIAGNOSIS — F1910 Other psychoactive substance abuse, uncomplicated: Secondary | ICD-10-CM | POA: Diagnosis present

## 2015-08-04 DIAGNOSIS — N390 Urinary tract infection, site not specified: Secondary | ICD-10-CM | POA: Insufficient documentation

## 2015-08-04 LAB — URINALYSIS, ROUTINE W REFLEX MICROSCOPIC
Bilirubin Urine: NEGATIVE
Glucose, UA: NEGATIVE mg/dL
Ketones, ur: NEGATIVE mg/dL
Nitrite: POSITIVE — AB
Protein, ur: NEGATIVE mg/dL
Specific Gravity, Urine: 1.015 (ref 1.005–1.030)
Urobilinogen, UA: 0.2 mg/dL (ref 0.0–1.0)
pH: 5.5 (ref 5.0–8.0)

## 2015-08-04 LAB — COMPREHENSIVE METABOLIC PANEL
ALT: 42 U/L (ref 14–54)
AST: 59 U/L — ABNORMAL HIGH (ref 15–41)
Albumin: 3.8 g/dL (ref 3.5–5.0)
Alkaline Phosphatase: 83 U/L (ref 38–126)
Anion gap: 13 (ref 5–15)
BUN: 15 mg/dL (ref 6–20)
CO2: 20 mmol/L — ABNORMAL LOW (ref 22–32)
Calcium: 7.9 mg/dL — ABNORMAL LOW (ref 8.9–10.3)
Chloride: 106 mmol/L (ref 101–111)
Creatinine, Ser: 0.87 mg/dL (ref 0.44–1.00)
GFR calc Af Amer: >60 mL/min (ref >60)
GFR calc non Af Amer: >60 mL/min (ref >60)
Glucose, Bld: 107 mg/dL — ABNORMAL HIGH (ref 65–99)
Potassium: 3.6 mmol/L (ref 3.5–5.1)
Sodium: 139 mmol/L (ref 135–145)
Total Bilirubin: 0.6 mg/dL (ref 0.3–1.2)
Total Protein: 6.9 g/dL (ref 6.5–8.1)

## 2015-08-04 LAB — CBC WITH DIFFERENTIAL/PLATELET
Basophils Absolute: 0.1 10*3/uL (ref 0.0–0.1)
Basophils Relative: 1 %
Eosinophils Absolute: 0.7 10*3/uL (ref 0.0–0.7)
Eosinophils Relative: 8 %
HCT: 35.7 % — ABNORMAL LOW (ref 36.0–46.0)
Hemoglobin: 11.7 g/dL — ABNORMAL LOW (ref 12.0–15.0)
Lymphocytes Relative: 24 %
Lymphs Abs: 2.1 10*3/uL (ref 0.7–4.0)
MCH: 30.8 pg (ref 26.0–34.0)
MCHC: 32.8 g/dL (ref 30.0–36.0)
MCV: 93.9 fL (ref 78.0–100.0)
Monocytes Absolute: 0.7 10*3/uL (ref 0.1–1.0)
Monocytes Relative: 8 %
Neutro Abs: 5.1 10*3/uL (ref 1.7–7.7)
Neutrophils Relative %: 59 %
Platelets: 551 10*3/uL — ABNORMAL HIGH (ref 150–400)
RBC: 3.8 MIL/uL — ABNORMAL LOW (ref 3.87–5.11)
RDW: 14.9 % (ref 11.5–15.5)
WBC: 8.7 10*3/uL (ref 4.0–10.5)

## 2015-08-04 LAB — LACTIC ACID, PLASMA
Lactic Acid, Venous: 4.1 mmol/L (ref 0.5–2.0)
Lactic Acid, Venous: 3.9 mmol/L (ref 0.5–2.0)

## 2015-08-04 LAB — CBG MONITORING, ED: Glucose-Capillary: 123 mg/dL — ABNORMAL HIGH (ref 65–99)

## 2015-08-04 LAB — SALICYLATE LEVEL: Salicylate Lvl: <4 mg/dL (ref 2.8–30.0)

## 2015-08-04 LAB — ACETAMINOPHEN LEVEL: Acetaminophen (Tylenol), Serum: <10 ug/mL — ABNORMAL LOW (ref 10–30)

## 2015-08-04 LAB — BASIC METABOLIC PANEL
Anion gap: 11 (ref 5–15)
BUN: 13 mg/dL (ref 6–20)
CO2: 21 mmol/L — ABNORMAL LOW (ref 22–32)
Calcium: 7.6 mg/dL — ABNORMAL LOW (ref 8.9–10.3)
Chloride: 111 mmol/L (ref 101–111)
Creatinine, Ser: 0.82 mg/dL (ref 0.44–1.00)
GFR calc Af Amer: >60 mL/min (ref >60)
GFR calc non Af Amer: >60 mL/min (ref >60)
Glucose, Bld: 98 mg/dL (ref 65–99)
Potassium: 4 mmol/L (ref 3.5–5.1)
Sodium: 143 mmol/L (ref 135–145)

## 2015-08-04 LAB — LITHIUM LEVEL: Lithium Lvl: 0.28 mmol/L — ABNORMAL LOW (ref 0.60–1.20)

## 2015-08-04 LAB — RAPID URINE DRUG SCREEN, HOSP PERFORMED
Amphetamines: NOT DETECTED
Barbiturates: NOT DETECTED
Benzodiazepines: POSITIVE — AB
Cocaine: NOT DETECTED
Opiates: NOT DETECTED
Tetrahydrocannabinol: NOT DETECTED

## 2015-08-04 LAB — PREGNANCY, URINE: Preg Test, Ur: NEGATIVE

## 2015-08-04 LAB — ETHANOL: Alcohol, Ethyl (B): 419 mg/dL (ref <5)

## 2015-08-04 LAB — URINE MICROSCOPIC-ADD ON

## 2015-08-04 MED ORDER — SODIUM CHLORIDE 0.9 % IV BOLUS (SEPSIS)
1000.0000 mL | Freq: Once | INTRAVENOUS | Status: AC
  Administered 2015-08-04: 1000 mL via INTRAVENOUS

## 2015-08-04 MED ORDER — LITHIUM CARBONATE 150 MG PO CAPS
150.0000 mg | ORAL_CAPSULE | Freq: Two times a day (BID) | ORAL | Status: DC
  Administered 2015-08-05: 150 mg via ORAL
  Filled 2015-08-04 (×7): qty 1

## 2015-08-04 MED ORDER — GABAPENTIN 800 MG PO TABS: 400.0000 mg | ORAL_TABLET | Freq: Three times a day (TID) | ORAL | Status: DC

## 2015-08-04 MED ORDER — AMITRIPTYLINE HCL 50 MG PO TABS
100.0000 mg | ORAL_TABLET | Freq: Every day | ORAL | Status: DC
  Administered 2015-08-05: 100 mg via ORAL
  Filled 2015-08-04: qty 1
  Filled 2015-08-04 (×3): qty 2

## 2015-08-04 MED ORDER — PANTOPRAZOLE SODIUM 40 MG PO TBEC
40.0000 mg | DELAYED_RELEASE_TABLET | Freq: Every day | ORAL | Status: DC
  Administered 2015-08-04 – 2015-08-05 (×2): 40 mg via ORAL
  Filled 2015-08-04 (×2): qty 1

## 2015-08-04 MED ORDER — VITAMIN B-1 100 MG PO TABS
100.0000 mg | ORAL_TABLET | Freq: Once | ORAL | Status: AC
  Administered 2015-08-04: 100 mg via ORAL
  Filled 2015-08-04: qty 1

## 2015-08-04 MED ORDER — ZIPRASIDONE MESYLATE 20 MG IM SOLR
10.0000 mg | Freq: Once | INTRAMUSCULAR | Status: AC
  Filled 2015-08-04: qty 20

## 2015-08-04 MED ORDER — SODIUM CHLORIDE 0.9 % IV SOLN
INTRAVENOUS | Status: DC
  Administered 2015-08-04: 1000 mL via INTRAVENOUS

## 2015-08-04 MED ORDER — LORAZEPAM 2 MG/ML IJ SOLN
1.0000 mg | INTRAMUSCULAR | Status: DC | PRN
  Filled 2015-08-04 (×2): qty 1

## 2015-08-04 MED ORDER — FLUOXETINE HCL 20 MG PO CAPS
60.0000 mg | ORAL_CAPSULE | Freq: Every day | ORAL | Status: DC
  Administered 2015-08-05: 60 mg via ORAL
  Filled 2015-08-04 (×2): qty 3

## 2015-08-04 MED ORDER — CEPHALEXIN 500 MG PO CAPS
500.0000 mg | ORAL_CAPSULE | Freq: Four times a day (QID) | ORAL | Status: DC
  Administered 2015-08-04 – 2015-08-05 (×4): 500 mg via ORAL
  Filled 2015-08-04 (×4): qty 1

## 2015-08-04 MED ORDER — LORAZEPAM 1 MG PO TABS
0.0000 mg | ORAL_TABLET | Freq: Two times a day (BID) | ORAL | Status: DC
  Administered 2015-08-04 – 2015-08-05 (×2): 2 mg via ORAL
  Filled 2015-08-04 (×2): qty 2

## 2015-08-04 MED ORDER — ZIPRASIDONE MESYLATE 20 MG IM SOLR
10.0000 mg | Freq: Once | INTRAMUSCULAR | Status: AC
  Administered 2015-08-04: 10 mg via INTRAMUSCULAR

## 2015-08-04 MED ORDER — LORAZEPAM 2 MG/ML IJ SOLN
1.0000 mg | Freq: Once | INTRAMUSCULAR | Status: AC
  Administered 2015-08-04: 1 mg via INTRAVENOUS

## 2015-08-04 MED ORDER — GABAPENTIN 400 MG PO CAPS
400.0000 mg | ORAL_CAPSULE | Freq: Three times a day (TID) | ORAL | Status: DC
  Administered 2015-08-04 – 2015-08-05 (×3): 400 mg via ORAL
  Filled 2015-08-04 (×3): qty 1

## 2015-08-04 MED ORDER — LEVOTHYROXINE SODIUM 50 MCG PO TABS
150.0000 ug | ORAL_TABLET | Freq: Every day | ORAL | Status: DC
  Administered 2015-08-05: 150 ug via ORAL
  Filled 2015-08-04: qty 3

## 2015-08-04 MED ORDER — STERILE WATER FOR INJECTION IJ SOLN
INTRAMUSCULAR | Status: AC
  Administered 2015-08-04: 1.2 mL
  Filled 2015-08-04: qty 10

## 2015-08-04 MED ORDER — DEXTROSE 5 % IV SOLN
1.0000 g | Freq: Once | INTRAVENOUS | Status: AC
  Administered 2015-08-04: 1 g via INTRAVENOUS
  Filled 2015-08-04: qty 10

## 2015-08-04 MED ORDER — AMMONIA AROMATIC IN INHA
RESPIRATORY_TRACT | Status: AC
  Administered 2015-08-04: 12:00:00
  Filled 2015-08-04: qty 10

## 2015-08-04 NOTE — ED Notes (Signed)
Pt belongings searched by [employer] at bedside. 2 pt belonging bags placed in locker-shirt, pants, shoes, jacket, and 2 bags with personal items.

## 2015-08-04 NOTE — ED Notes (Signed)
[employer] signed off.

## 2015-08-04 NOTE — ED Notes (Signed)
Pt up to the bedside commode, pt calm, nausea and headache. Pt wanting TV off and door closed, Pt states she is not sure what she told staff when she came in. Pt states she wants detox, states she is not SI/HI

## 2015-08-04 NOTE — BH Assessment (Addendum)
Tele Assessment Note   Jacqueline [name] is an 49 y.o. female who presented to [facility] asking to be detoxed from alcohol.  Patient has been to [facility] inpatient in the  past including May 2016, January 2016 and August 2015 for alcohol detox and suicidal ideations.  Today she was brought to the ED by her friend and per notes she stated she was suicidal at approximately 9am this morning.  At 5:00pm she was assessment by this examiner and still appeared intoxicated with poor memory recall, anger and irritability but she denied being suicidal or homicidal stating that she did not remember saying she wanted to commit suicide earlier in the day.  Patient provided the answer of I don't remember" for most questions asked and would get angry stating she just wanted to detox and go home.  A review of patient's medical chart revealed the following:  Patient has been drinking 6 - 8 oz of Listerine daily for years. Her record reflects a history of hearing voices and seeing things but she  denied this today to this examiner.  Her record also reflects being depressed for years with symptoms of insomnia, depressed mood, lack of interest in things, guilt, worthlessness, fatigue and tearfulness.  Patient did admit today that she has had trouble with her sleep but stated she could not remember how much sleep she is getting.  Patient reported that she has been seeing a psychiatrist.  Patient would not give this examiner permission to speak to the female friend she lives with and stated that she has no other family/friend support.  She reported that her longest clean time from alcohol has been for 3 months and she has accomplished this by attending AA.  Consulted with NP May who recommended patient remain in the ED overnight and be re assessed in the morning.   Diagnosis: 303.90 Alcohol use disorder, Severe   Past Medical History:  Past Medical History  Diagnosis Date  . Depression   . Peptic ulcer   . Alcohol abuse   . Anemia    . Benzodiazepine abuse   . Thyroid disease   . Alcoholism ([place])   . Narcotic abuse   . Back pain   . Thrombocytopenia ([place]) 06/17/2011  . Hypothyroidism   . Seizures ([place])   . Hypoglycemia   . Suicide attempt [facility])     multiple times    Past Surgical History  Procedure Laterality Date  . Cholecystectomy    . Abdominal surgery    . Esophagogastroduodenoscopy    . Gastric bypass    . Tubal ligation    . Esophagogastroduodenoscopy N/A 03/23/2013    Procedure: ESOPHAGOGASTRODUODENOSCOPY (EGD);  Surgeon: Ladene Artist, MD;  Location: [facility];  Service: Endoscopy;  Laterality: N/A;    Family History:  Family History  Problem Relation Age of Onset  . Alcohol abuse Father   . Alcoholism Father   . Cancer Other     Social History:  reports that she has been smoking Cigarettes.  She has a 12 pack-year smoking history. She has never used smokeless tobacco. She reports that she uses illicit drugs (Oxycodone, Cocaine, and Benzodiazepines). She reports that she does not drink alcohol.  Additional Social History:  Alcohol / Drug Use Pain Medications:  (see medical record) Prescriptions:  (see medical record) Over the Counter:  (see medical record) History of alcohol / drug use?: Yes Longest period of sobriety (when/how long):  (3 months) Negative Consequences of Use: Financial, Personal relationships, Work /  School, Legal Withdrawal Symptoms:  (see medical chart) Substance #1 Name of Substance 1:  (alcohol) 1 - Age of First Use:  (pt states she does not remember) 1 - Amount (size/oz):  (states does not remember) 1 - Frequency:  (all the time all day) 1 - Duration:  (a long time) 1 - Last Use / Amount:  (this morning/she does not remember how much)  CIWA: CIWA-Ar BP: 119/64 mmHg Pulse Rate: 87 COWS:    PATIENT STRENGTHS: (choose at least two) Physical Health Supportive family/friends  Allergies:  Allergies  Allergen Reactions  . Nsaids Other (See Comments)     G.I. Bleed    Home Medications:  (Not in a hospital admission)  OB/GYN Status:  Patient's last menstrual period was 06/29/2015 (lmp unknown).  General Assessment Data Location of Assessment: [facility] TTS Assessment: In system Is this a Tele or Face-to-Face Assessment?: Tele Assessment Is this an Initial Assessment or a Re-assessment for this encounter?: Initial Assessment Marital status: Single Maiden name:  (n/a) Is patient pregnant?: Unknown Pregnancy Status: Unknown Living Arrangements: Non-relatives/Friends Can pt return to current living arrangement?: Yes Admission Status: Voluntary Is patient capable of signing voluntary admission?: Yes Referral Source: MD Insurance type:  (none)  Medical Screening Exam ([place]) Medical Exam completed: No Reason for MSE not completed: Other: (in process)  Crisis Care Plan Living Arrangements: Non-relatives/Friends Name of Psychiatrist:  (Dr Curt Bears) Name of Therapist:  Ned Grace)  Education Status Is patient currently in school?: No Current Grade:  (n/a) Highest grade of school patient has completed:  (unknown) Name of school:  (n/a) Contact person:  (n/a)  Risk to self with the past 6 months Suicidal Ideation: No Has patient been a risk to self within the past 6 months prior to admission? : Yes Suicidal Intent: No Has patient had any suicidal intent within the past 6 months prior to admission? : Yes Is patient at risk for suicide?: No Suicidal Plan?: No Has patient had any suicidal plan within the past 6 months prior to admission? : Yes Access to Means: Yes Specify Access to Suicidal Means:  (she has prescriptions ) What has been your use of drugs/alcohol within the last 12 months?:  (listerine and beer) Previous Attempts/Gestures: Yes How many times?:  (3) Other Self Harm Risks:  (none known) Triggers for Past Attempts: Unknown Intentional Self Injurious Behavior: None Family Suicide History: Unknown Recent stressful  life event(s):  (unknown) Persecutory voices/beliefs?:  (unknown) Depression:  (history per pt's chart but pt would not provide answers) Depression Symptoms:  (in pt's history per her chart) Substance abuse history and/or treatment for substance abuse?: Yes Suicide prevention information given to non-admitted patients: Not applicable  Risk to Others within the past 6 months Homicidal Ideation: No Does patient have any lifetime risk of violence toward others beyond the six months prior to admission? : Unknown Thoughts of Harm to Others: No Current Homicidal Intent: No Current Homicidal Plan: No Access to Homicidal Means: No Identified Victim:  (none) History of harm to others?:  (unknown) Assessment of Violence:  (unknown pt would not answer) Violent Behavior Description:  (unknown) Does patient have access to weapons?:  (unknown) Criminal Charges Pending?:  (unknown) Does patient have a court date:  (unknown) Is patient on probation?:  (unknown)  Psychosis Hallucinations: None noted Delusions: None noted  Mental Status Report Appearance/Hygiene: Disheveled Eye Contact: Poor Motor Activity: Agitation Speech: Aggressive Level of Consciousness: Combative, Drowsy Mood: Angry Affect: Angry Anxiety Level: Moderate Thought Processes:  Unable to Assess Judgement: Impaired Orientation: Person, Place, Time, Situation Obsessive Compulsive Thoughts/Behaviors: Unable to Assess  Cognitive Functioning Concentration: Decreased Memory: Recent Impaired, Remote Impaired IQ: Average Insight: Poor Impulse Control: Poor Appetite: Fair Weight Loss:  (unknown) Weight Gain:  (unknown) Sleep: Decreased Total Hours of Sleep:  (varies) Vegetative Symptoms:  (unknown)  ADLScreening [facility] Assessment Services) Patient's cognitive ability adequate to safely complete daily activities?: No Patient able to express need for assistance with ADLs?: Yes Independently performs ADLs?: Yes (appropriate  for developmental age)  Prior Inpatient Therapy Prior Inpatient Therapy: Yes Prior Therapy Dates:  (May 2016, January 2016, august 2015) Prior Therapy Facilty/Provider(s):  [facility]) Reason for Treatment:  (detox and suicidal ideations)  Prior Outpatient Therapy Prior Outpatient Therapy: Yes Prior Therapy Dates:  (unknown pt could not remember) Prior Therapy Facilty/Provider(s):  (unknown) Reason for Treatment:  (bipolar) Does patient have an ACCT team?: No Does patient have Intensive In-House Services?  : No Does patient have [facility] services? : No Does patient have P4CC services?: No  ADL Screening (condition at time of admission) Patient's cognitive ability adequate to safely complete daily activities?: No Is the patient deaf or have difficulty hearing?: No Does the patient have difficulty seeing, even when wearing glasses/contacts?: No Does the patient have difficulty concentrating, remembering, or making decisions?: Yes Patient able to express need for assistance with ADLs?: Yes Does the patient have difficulty dressing or bathing?: No Independently performs ADLs?: Yes (appropriate for developmental age) Does the patient have difficulty walking or climbing stairs?: No Weakness of Legs: None Weakness of Arms/Hands: None  Home Assistive Devices/Equipment Home Assistive Devices/Equipment: None  Therapy Consults (therapy consults require a physician order) PT Evaluation Needed: No OT Evalulation Needed: No SLP Evaluation Needed: No Abuse/Neglect Assessment (Assessment to be complete while patient is alone) Physical Abuse:  (refused to answer) Verbal Abuse:  (refused to answer) Sexual Abuse:  (refused to answer) Exploitation of patient/patient's resources:  (refused to answer) Self-Neglect:  (refused to answer) Possible abuse reported to::  (n/a) Values / Beliefs Cultural Requests During Hospitalization: None Spiritual Requests During Hospitalization:  None Consults Spiritual Care Consult Needed: No Social Work Consult Needed: No Regulatory affairs officer (For Healthcare) Does patient have an advance directive?: No Would patient like information on creating an advanced directive?: No - patient declined information    Additional Information 1:1 In Past 12 Months?: No CIRT Risk: No Elopement Risk: No Does patient have medical clearance?: No     Disposition:  Disposition Initial Assessment Completed for this Encounter: Yes  Carlean Jews 08/04/2015 6:05 PM

## 2015-08-04 NOTE — ED Notes (Signed)
CRITICAL VALUE ALERT  Critical value received: Alcohol 419  Date of notification:  08/04/15  Time of notification:  4315  Critical value read back:Yes.    Nurse who received alert:  Laurell Josephs RN  MD notified (1st page):  Wilson Singer  Time of first page:  1040  MD notified (2nd page):  Time of second page:  Responding MD:  Wilson Singer  Time MD responded:  1040

## 2015-08-04 NOTE — ED Notes (Signed)
Pt states she drinks 1 case of beer per day. Pt placed on CIWA protocol by EDP. Pt scored 11, receiving 2mg  Ativan po.

## 2015-08-04 NOTE — ED Provider Notes (Signed)
CSN: [id number]     Arrival date & time 08/04/15  5465 History  By signing my name below, I, Jacqueline [name], attest that this documentation has been prepared under the direction and in the presence of [name], MD. Electronically Signed: [name], ED Scribe. 08/04/2015. 9:39 AM.    Chief Complaint  Patient presents with  . Suicidal   The history is provided by the police. No language interpreter was used.   HPI Comments: Level 5 Caveat due to pt's condition  Jacqueline STRIETER is a 49 y.o. female with a hx of multiple suicide attempts, depression, narcotic abuse, and alcoholism who presents to the Emergency Department status post a suicide attempt that occurred PTA. Per nurse, pt was in the parking lot found by police drinking Listerine and hand sanitizer as pt stated that she wanted to "kill herself". Per police, pt was able to speak to her PTA.   Past Medical History  Diagnosis Date  . Depression   . Peptic ulcer   . Alcohol abuse   . Anemia   . Benzodiazepine abuse   . Thyroid disease   . Alcoholism   . Narcotic abuse   . Back pain   . Thrombocytopenia 06/17/2011  . Hypothyroidism   . Seizures   . Hypoglycemia   . Suicide attempt     multiple times   Past Surgical History  Procedure Laterality Date  . Cholecystectomy    . Abdominal surgery    . Esophagogastroduodenoscopy    . Gastric bypass    . Tubal ligation    . Esophagogastroduodenoscopy N/A 03/23/2013    Procedure: ESOPHAGOGASTRODUODENOSCOPY (EGD);  Surgeon: Ladene Artist, MD;  Location: [facility];  Service: Endoscopy;  Laterality: N/A;   Family History  Problem Relation Age of Onset  . Alcohol abuse Father   . Alcoholism Father   . Cancer Other    Social History  Substance Use Topics  . Smoking status: Current Every Day Smoker -- 1.00 packs/day for 12 years    Types: Cigarettes    Last Attempt to Quit: 08/08/2011  . Smokeless tobacco: Never Used  . Alcohol Use: No     Comment: drink 4  liters a day. 1 case/day drinks Listerine/ 21 days sober today 03/12/15, per pt   OB History    Gravida Para Term Preterm AB TAB SAB Ectopic Multiple Living   3 3  3      3      Review of Systems  Unable to perform ROS: Patient unresponsive  Psychiatric/Behavioral: Positive for suicidal ideas.  All other systems reviewed and are negative.     Allergies  Nsaids  Home Medications   Prior to Admission medications   Medication Sig Start Date End Date Taking? Authorizing Provider  amitriptyline (ELAVIL) 100 MG tablet Take 1 tablet (100 mg total) by mouth at bedtime. 02/23/15   [name], NP  clonazePAM (KLONOPIN) 0.5 MG tablet Take 0.5 mg by mouth daily.     Historical Provider, MD  FLUoxetine (PROZAC) 20 MG capsule Take 3 capsules (60 mg total) by mouth daily. 02/23/15   [name], NP  gabapentin (NEURONTIN) 800 MG tablet Take 0.5 tablets (400 mg total) by mouth 3 (three) times daily. 03/12/15   [name], MD  levothyroxine (SYNTHROID, LEVOTHROID) 150 MCG tablet Take 1 tablet (150 mcg total) by mouth daily before breakfast. 02/23/15   [name], NP  lithium carbonate 150 MG capsule Take 1 capsule (150 mg total)  by mouth 2 (two) times daily with a meal. 02/23/15   [name], NP  Multiple Vitamin (MULTIVITAMIN WITH MINERALS) TABS tablet Take 1 tablet by mouth daily. Patient not taking: Reported on 03/12/2015 02/23/15   [name], NP  omeprazole (PRILOSEC) 40 MG capsule Take 1 capsule (40 mg total) by mouth daily. 02/23/15   [name], NP  traMADol (ULTRAM) 50 MG tablet Take 1 tablet (50 mg total) by mouth every 6 (six) hours as needed. 07/07/15   [name], PA-C   LMP 06/29/2015 (Approximate) Physical Exam  Constitutional: She appears well-developed and well-nourished. No distress.  Laying in bed with eyes closed, will open eyes to sternal rub. Not following commands.  No external signs of acute trauma Pupils symmetric, dilated and equally reactive    HENT:  Head: Normocephalic and atraumatic.  Neck: Normal range of motion.  Cardiovascular: Normal rate, regular rhythm and normal heart sounds.   Pulmonary/Chest: Effort normal and breath sounds normal.  Abdominal: Soft. She exhibits no distension. There is no tenderness.  Musculoskeletal: Normal range of motion.  Skin: Skin is warm and dry.  Nursing note and vitals reviewed.   ED Course  Procedures (including critical care time) DIAGNOSTIC STUDIES: Oxygen Saturation is 99% on RA, normal by my interpretation.    COORDINATION OF CARE: 9:40 AM- Pt advised of plan for treatment and pt agrees.  10:27 AM - Pt awake, screaming, yelling, and being combative, requiring physical restraints.    Labs Review Labs Reviewed  CBC WITH DIFFERENTIAL/PLATELET - Abnormal; Notable for the following:    RBC 3.80 (*)    Hemoglobin 11.7 (*)    HCT 35.7 (*)    Platelets 551 (*)    All other components within normal limits  COMPREHENSIVE METABOLIC PANEL - Abnormal; Notable for the following:    CO2 20 (*)    Glucose, Bld 107 (*)    Calcium 7.9 (*)    AST 59 (*)    All other components within normal limits  LACTIC ACID, PLASMA - Abnormal; Notable for the following:    Lactic Acid, Venous 4.1 (*)    All other components within normal limits  LACTIC ACID, PLASMA - Abnormal; Notable for the following:    Lactic Acid, Venous 3.9 (*)    All other components within normal limits  URINALYSIS, ROUTINE W REFLEX MICROSCOPIC (NOT AT [facility]) - Abnormal; Notable for the following:    Hgb urine dipstick SMALL (*)    Nitrite POSITIVE (*)    Leukocytes, UA TRACE (*)    All other components within normal limits  URINE RAPID DRUG SCREEN, HOSP PERFORMED - Abnormal; Notable for the following:    Benzodiazepines POSITIVE (*)    All other components within normal limits  ACETAMINOPHEN LEVEL - Abnormal; Notable for the following:    Acetaminophen (Tylenol), Serum <10 (*)    All other components within normal limits   ETHANOL - Abnormal; Notable for the following:    Alcohol, Ethyl (B) 419 (*)    All other components within normal limits  URINE MICROSCOPIC-ADD ON - Abnormal; Notable for the following:    Bacteria, UA MANY (*)    All other components within normal limits  BASIC METABOLIC PANEL - Abnormal; Notable for the following:    CO2 21 (*)    Calcium 7.6 (*)    All other components within normal limits  CBG MONITORING, ED - Abnormal; Notable for the following:    Glucose-Capillary 123 (*)  All other components within normal limits  URINE CULTURE  SALICYLATE LEVEL  PREGNANCY, URINE  LITHIUM LEVEL    Imaging Review No results found. [name], MD has personally reviewed and evaluated these images and lab results as part of his medical decision-making.   EKG Interpretation   Date/Time:  Thursday August 04 2015 10:58:17 EDT Ventricular Rate:  89 PR Interval:  147 QRS Duration: 105 QT Interval:  400 QTC Calculation: 487 R Axis:   78 Text Interpretation:  Sinus rhythm Borderline prolonged QT interval  Confirmed by Wilson Singer  MD, [place] ([id number]) on 08/04/2015 11:12:06 AM      MDM   Final diagnoses:  Alcohol intoxication, with unspecified complication (HCC)  Suicidal ideation  UTI (lower urinary tract infection)    49 year old female with intentional overdose. Reportedly mentioned suicidal ideation to police. Now IVC'd. Somnolent on arrival. Ethanol level markedly elevated.  Subsequent became very agitated requiring chemical sedation. Workup significant for UTI. Antibiotics started. She is afebrile. Mild lactic acidosis. IV fluids. Will check another third lactate to make sure clearing. Pt becoming more awake. Now telling me to "fuck off" on reassessment. Should be able to participate in psych assessment soon. Lithium on med list and will check level since not initially obtained.  I personally preformed the services scribed in my presence. The recorded information has been reviewed  is accurate. [name], MD.     [name], MD 08/14/15 2121

## 2015-08-04 NOTE — ED Notes (Signed)
Pt brought in by [employer] for SI and Listerine ingestion. Pt told police that she was wanting to kill herself before becoming unresponsive. Pt has hx of drinking Listerine and hand sanitizer.

## 2015-08-04 NOTE — ED Notes (Signed)
CRITICAL VALUE ALERT  Critical value received:  Lactic   Date of notification:  08/04/15  Time of notification:  1610  Critical value read back:Yes.    Nurse who received alert:  S. Janeth Terry RN  MD notified (1st page):  Dr. Wilson Singer  Time of first page:  1038  MD notified (2nd page):  Time of second page:  Responding MD:  Dr. Wilson Singer  Time MD responded:  [phone]

## 2015-08-04 NOTE — ED Notes (Signed)
CRITICAL VALUE ALERT  Critical value received:  LACTIC 3.9  Date of notification:  08/04/15  Time of notification:  0947  Critical value read backYES  Nurse who received alert: j. Champ Keetch  MD notified (1st page): Dr. Wilson Singer  Time of first page:  1350  MD notified (2nd page):  Time of second page:  Responding MD:  same  Time MD responded:  Same

## 2015-08-04 NOTE — Clinical Social Work Note (Signed)
CSW provided MD with disposition to have pt remain in the ED overnight and she will be re assessed in the AM  [place] [facility] Triage

## 2015-08-05 ENCOUNTER — Emergency Department (HOSPITAL_COMMUNITY)
Admission: EM | Admit: 2015-08-05 | Discharge: 2015-08-06 | Disposition: A | Discharge status: Home / Self Care | Payer: Self-pay | Attending: Emergency Medicine | Admitting: Emergency Medicine

## 2015-08-05 ENCOUNTER — Emergency Department (HOSPITAL_COMMUNITY): Payer: Self-pay

## 2015-08-05 DIAGNOSIS — S0191XA Laceration without foreign body of unspecified part of head, initial encounter: Secondary | ICD-10-CM

## 2015-08-05 DIAGNOSIS — R61 Generalized hyperhidrosis: Secondary | ICD-10-CM | POA: Insufficient documentation

## 2015-08-05 DIAGNOSIS — S0990XA Unspecified injury of head, initial encounter: Secondary | ICD-10-CM

## 2015-08-05 DIAGNOSIS — Y929 Unspecified place or not applicable: Secondary | ICD-10-CM | POA: Insufficient documentation

## 2015-08-05 DIAGNOSIS — R780 Finding of alcohol in blood: Secondary | ICD-10-CM

## 2015-08-05 DIAGNOSIS — F1994 Other psychoactive substance use, unspecified with psychoactive substance-induced mood disorder: Secondary | ICD-10-CM

## 2015-08-05 DIAGNOSIS — F419 Anxiety disorder, unspecified: Secondary | ICD-10-CM | POA: Insufficient documentation

## 2015-08-05 DIAGNOSIS — R000 Tachycardia, unspecified: Secondary | ICD-10-CM | POA: Insufficient documentation

## 2015-08-05 DIAGNOSIS — Y999 Unspecified external cause status: Secondary | ICD-10-CM | POA: Insufficient documentation

## 2015-08-05 DIAGNOSIS — S0101XA Laceration without foreign body of scalp, initial encounter: Secondary | ICD-10-CM | POA: Insufficient documentation

## 2015-08-05 DIAGNOSIS — Y939 Activity, unspecified: Secondary | ICD-10-CM | POA: Insufficient documentation

## 2015-08-05 LAB — COMPREHENSIVE METABOLIC PANEL
ALK PHOS: 89 U/L (ref 38–126)
ALT: 41 U/L (ref 14–54)
AST: 48 U/L — AB (ref 15–41)
Albumin: 3.3 g/dL — ABNORMAL LOW (ref 3.5–5.0)
Anion gap: 17 — ABNORMAL HIGH (ref 5–15)
BUN: 8 mg/dL (ref 6–20)
CALCIUM: 8.5 mg/dL — AB (ref 8.9–10.3)
CHLORIDE: 106 mmol/L (ref 101–111)
CO2: 18 mmol/L — AB (ref 22–32)
CREATININE: 1.03 mg/dL — AB (ref 0.44–1.00)
Glucose, Bld: 100 mg/dL — ABNORMAL HIGH (ref 65–99)
Potassium: 3.4 mmol/L — ABNORMAL LOW (ref 3.5–5.1)
SODIUM: 141 mmol/L (ref 135–145)
Total Bilirubin: 0.5 mg/dL (ref 0.3–1.2)
Total Protein: 5.9 g/dL — ABNORMAL LOW (ref 6.5–8.1)

## 2015-08-05 LAB — I-STAT TROPONIN, ED: Troponin i, poc: 0 ng/mL (ref 0.00–0.08)

## 2015-08-05 LAB — CBC
HCT: 32.1 % — ABNORMAL LOW (ref 36.0–46.0)
Hemoglobin: 10.3 g/dL — ABNORMAL LOW (ref 12.0–15.0)
MCH: 29.9 pg (ref 26.0–34.0)
MCHC: 32.1 g/dL (ref 30.0–36.0)
MCV: 93 fL (ref 78.0–100.0)
PLATELETS: 415 10*3/uL — AB (ref 150–400)
RBC: 3.45 MIL/uL — AB (ref 3.87–5.11)
RDW: 14.5 % (ref 11.5–15.5)
WBC: 8.4 10*3/uL (ref 4.0–10.5)

## 2015-08-05 LAB — PROTIME-INR
INR: 1.06 (ref 0.00–1.49)
Prothrombin Time: 14 seconds (ref 11.6–15.2)

## 2015-08-05 LAB — ETHANOL: ALCOHOL ETHYL (B): 267 mg/dL — AB (ref <5)

## 2015-08-05 LAB — CDS SEROLOGY

## 2015-08-05 LAB — I-STAT CG4 LACTIC ACID, ED: Lactic Acid, Venous: 1.57 mmol/L (ref 0.5–2.0)

## 2015-08-05 MED ORDER — AMITRIPTYLINE HCL 50 MG PO TABS
ORAL_TABLET | ORAL | Status: AC
  Filled 2015-08-05: qty 2

## 2015-08-05 MED ORDER — HALOPERIDOL LACTATE 5 MG/ML IJ SOLN
INTRAMUSCULAR | Status: DC | PRN
  Administered 2015-08-05: 2.5 mg via INTRAVENOUS

## 2015-08-05 MED ORDER — CEPHALEXIN 500 MG PO CAPS: 500.0000 mg | ORAL_CAPSULE | Freq: Four times a day (QID) | ORAL | Status: DC

## 2015-08-05 MED ORDER — BACITRACIN ZINC 500 UNIT/GM EX OINT: TOPICAL_OINTMENT | Freq: Once | CUTANEOUS | Status: DC

## 2015-08-05 MED ORDER — GABAPENTIN 400 MG PO CAPS: 400.0000 mg | ORAL_CAPSULE | Freq: Three times a day (TID) | ORAL | Status: DC

## 2015-08-05 MED ORDER — ZIPRASIDONE MESYLATE 20 MG IM SOLR
10.0000 mg | Freq: Once | INTRAMUSCULAR | Status: DC
  Filled 2015-08-05: qty 20

## 2015-08-05 MED ORDER — ZIPRASIDONE MESYLATE 20 MG IM SOLR
INTRAMUSCULAR | Status: DC | PRN
  Administered 2015-08-05: 10 mg via INTRAMUSCULAR
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
## 2015-08-05 NOTE — ED Notes (Addendum)
Pt sleeping. 
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
## 2015-08-05 NOTE — ED Notes (Signed)
Pt returned from the restroom. Ambulatory with steady gait.

## 2015-08-05 NOTE — ED Provider Notes (Signed)
Patient has been cleared by psychiatry in stable for discharge. She does not represent an harm to herself or anyone else. She is not suicidal or homicidal.  IVC rescinded. Treat UTI.  BP 126/69 mmHg  Pulse 66  Temp(Src) 98.2 F (36.8 C) (Oral)  Resp 18  SpO2 99%  LMP 06/29/2015 (LMP Unknown)   Ezequiel Essex, MD 08/05/15 1329

## 2015-08-05 NOTE — Consult Note (Signed)
Telepsych Consultation   Reason for Consult:  ETOH abuse  Referring Physician:  APED Patient Identification: Jacqueline [name] MRN:  [id number] Principal Diagnosis: Substance induced mood disorder ([place]) Diagnosis:   Patient Active Problem List   Diagnosis Date Noted  . Severe recurrent major depression without psychotic features ([place]) [F33.2] 10/17/2014    Priority: High  . Substance induced mood disorder ([place]) [F19.94] 10/17/2014    Priority: High  . Suicidal ideation [R45.851]     Priority: High  . Bipolar disorder, current episode depressed, severe, without psychotic features ([place]) [F31.4] 02/17/2015  . Alcoholism ([place]) [F10.20]   . Alcohol dependence with withdrawal, uncomplicated ([place]) [O13.086] 10/17/2014  . Intentional ibuprofen overdose ([place]) [T39.312A] 10/17/2014  . Overdose [T50.901A]   . Persistent alcohol intoxication delirium with moderate or severe use disorder ([place]) [F10.221] 12/19/2013  . PTSD (post-traumatic stress disorder) [F43.10] 08/03/2013  . Unspecified episodic mood disorder [F39] 05/14/2013  . Hallucinations [R44.3] 04/14/2013  . Anastomotic ulcer, acute [K28.3] 03/23/2013  . Melena [K92.1] 03/21/2013  . Abnormal liver enzymes [R74.8] 12/18/2011  . Cocaine abuse, episodic [F14.10] 12/18/2011    Class: Acute  . PUD (peptic ulcer disease) [K27.9] 12/18/2011  . Anxiety disorder [F41.9] 06/19/2011  . Bacterial vaginosis [N76.0, A49.9] 06/19/2011  . Anemia [D64.9] 06/17/2011  . Thrombocytopenia ([place]) [D69.6] 06/17/2011  . UTI (urinary tract infection) [N39.0] 06/16/2011  . Hypothyroidism [E03.9] 06/12/2011  . Polysubstance abuse [F19.10] 06/12/2011    Total Time spent with patient: 30 minutes  Subjective:   Jacqueline [name] is a 49 y.o. female patient admitted with substance abuse  HPI:   HPI Elements:   See HPI  Past Medical History:  Past Medical History  Diagnosis Date  . Depression   . Peptic ulcer   . Alcohol abuse   . Anemia   .  Benzodiazepine abuse   . Thyroid disease   . Alcoholism ([place])   . Narcotic abuse   . Back pain   . Thrombocytopenia ([place]) 06/17/2011  . Hypothyroidism   . Seizures ([place])   . Hypoglycemia   . Suicide attempt [facility])     multiple times    Past Surgical History  Procedure Laterality Date  . Cholecystectomy    . Abdominal surgery    . Esophagogastroduodenoscopy    . Gastric bypass    . Tubal ligation    . Esophagogastroduodenoscopy N/A 03/23/2013    Procedure: ESOPHAGOGASTRODUODENOSCOPY (EGD);  Surgeon: Ladene Artist, MD;  Location: [facility];  Service: Endoscopy;  Laterality: N/A;   Family History:  Family History  Problem Relation Age of Onset  . Alcohol abuse Father   . Alcoholism Father   . Cancer Other    Social History:  History  Alcohol Use No    Comment: drink 4 liters a day. 1 case/day drinks Listerine/ 21 days sober today 03/12/15, per pt     History  Drug Use  . Yes  . Special: Oxycodone, Cocaine, Benzodiazepines    Comment: benzo (Rx for klonopin) Last used cocaine over a year ago.     Social History   Social History  . Marital Status: Single    Spouse Name: N/A  . Number of Children: N/A  . Years of Education: N/A   Social History Main Topics  . Smoking status: Current Every Day Smoker -- 1.00 packs/day for 12 years    Types: Cigarettes    Last Attempt to Quit: 08/08/2011  . Smokeless tobacco: Never Used  . Alcohol Use: No  Comment: drink 4 liters a day. 1 case/day drinks Listerine/ 21 days sober today 03/12/15, per pt  . Drug Use: Yes    Special: Oxycodone, Cocaine, Benzodiazepines     Comment: benzo (Rx for klonopin) Last used cocaine over a year ago.   Marland Kitchen Sexual Activity: Yes    Birth Control/ Protection: Surgical   Other Topics Concern  . None   Social History Narrative   ** Merged History Encounter **       Additional Social History:    Pain Medications:  (see medical record) Prescriptions:  (see medical record) Over the Counter:   (see medical record) History of alcohol / drug use?: Yes Longest period of sobriety (when/how long):  (3 months) Negative Consequences of Use: Financial, Personal relationships, Work / Youth worker, Scientist, research (physical sciences) Withdrawal Symptoms:  (see medical chart) Name of Substance 1:  (alcohol) 1 - Age of First Use:  (pt states she does not remember) 1 - Amount (size/oz):  (states does not remember) 1 - Frequency:  (all the time all day) 1 - Duration:  (a long time) 1 - Last Use / Amount:  (this morning/she does not remember how much)                   Allergies:   Allergies  Allergen Reactions  . Nsaids Other (See Comments)    G.I. Bleed    Labs:  Results for orders placed or performed during the hospital encounter of 08/04/15 (from the past 48 hour(s))  CBG monitoring, ED     Status: Abnormal   Collection Time: 08/04/15  9:36 AM  Result Value Ref Range   Glucose-Capillary 123 (H) 65 - 99 mg/dL  CBC with Differential     Status: Abnormal   Collection Time: 08/04/15  9:54 AM  Result Value Ref Range   WBC 8.7 4.0 - 10.5 K/uL   RBC 3.80 (L) 3.87 - 5.11 MIL/uL   Hemoglobin 11.7 (L) 12.0 - 15.0 g/dL   HCT 35.7 (L) 36.0 - 46.0 %   MCV 93.9 78.0 - 100.0 fL   MCH 30.8 26.0 - 34.0 pg   MCHC 32.8 30.0 - 36.0 g/dL   RDW 14.9 11.5 - 15.5 %   Platelets 551 (H) 150 - 400 K/uL   Neutrophils Relative % 59 %   Lymphocytes Relative 24 %   Monocytes Relative 8 %   Eosinophils Relative 8 %   Basophils Relative 1 %   Neutro Abs 5.1 1.7 - 7.7 K/uL   Lymphs Abs 2.1 0.7 - 4.0 K/uL   Monocytes Absolute 0.7 0.1 - 1.0 K/uL   Eosinophils Absolute 0.7 0.0 - 0.7 K/uL   Basophils Absolute 0.1 0.0 - 0.1 K/uL   RBC Morphology ELLIPTOCYTES   Comprehensive metabolic panel     Status: Abnormal   Collection Time: 08/04/15  9:54 AM  Result Value Ref Range   Sodium 139 135 - 145 mmol/L   Potassium 3.6 3.5 - 5.1 mmol/L   Chloride 106 101 - 111 mmol/L   CO2 20 (L) 22 - 32 mmol/L   Glucose, Bld 107 (H) 65 - 99 mg/dL    BUN 15 6 - 20 mg/dL   Creatinine, Ser 0.87 0.44 - 1.00 mg/dL   Calcium 7.9 (L) 8.9 - 10.3 mg/dL   Total Protein 6.9 6.5 - 8.1 g/dL   Albumin 3.8 3.5 - 5.0 g/dL   AST 59 (H) 15 - 41 U/L   ALT 42 14 - 54 U/L  Alkaline Phosphatase 83 38 - 126 U/L   Total Bilirubin 0.6 0.3 - 1.2 mg/dL   GFR calc non Af Amer >60 >60 mL/min   GFR calc Af Amer >60 >60 mL/min    Comment: (NOTE) The eGFR has been calculated using the CKD EPI equation. This calculation has not been validated in all clinical situations. eGFR's persistently <60 mL/min signify possible Chronic Kidney Disease.    Anion gap 13 5 - 15  Lactic acid, plasma     Status: Abnormal   Collection Time: 08/04/15  9:54 AM  Result Value Ref Range   Lactic Acid, Venous 4.1 (HH) 0.5 - 2.0 mmol/L    Comment: CRITICAL RESULT CALLED TO, READ BACK BY AND VERIFIED WITH: HONEYCUTT,C AT 10:40AM ON 08/04/15 BY FESTERMAN,C   Acetaminophen level     Status: Abnormal   Collection Time: 08/04/15  9:54 AM  Result Value Ref Range   Acetaminophen (Tylenol), Serum <10 (L) 10 - 30 ug/mL    Comment:        THERAPEUTIC CONCENTRATIONS VARY SIGNIFICANTLY. A RANGE OF 10-30 ug/mL MAY BE AN EFFECTIVE CONCENTRATION FOR MANY PATIENTS. HOWEVER, SOME ARE BEST TREATED AT CONCENTRATIONS OUTSIDE THIS RANGE. ACETAMINOPHEN CONCENTRATIONS >150 ug/mL AT 4 HOURS AFTER INGESTION AND >50 ug/mL AT 12 HOURS AFTER INGESTION ARE OFTEN ASSOCIATED WITH TOXIC REACTIONS.   Salicylate level     Status: None   Collection Time: 08/04/15  9:54 AM  Result Value Ref Range   Salicylate Lvl <0.0 2.8 - 30.0 mg/dL  Ethanol     Status: Abnormal   Collection Time: 08/04/15  9:54 AM  Result Value Ref Range   Alcohol, Ethyl (B) 419 (HH) <5 mg/dL    Comment: CRITICAL RESULT CALLED TO, READ BACK BY AND VERIFIED WITH: CREWS,M AT 10:40AM ON 08/04/15 BY FESTERMAN,C        LOWEST DETECTABLE LIMIT FOR SERUM ALCOHOL IS 5 mg/dL FOR MEDICAL PURPOSES ONLY   Urinalysis, Routine w reflex  microscopic (not at [facility])     Status: Abnormal   Collection Time: 08/04/15 11:47 AM  Result Value Ref Range   Color, Urine YELLOW YELLOW   APPearance CLEAR CLEAR   Specific Gravity, Urine 1.015 1.005 - 1.030   pH 5.5 5.0 - 8.0   Glucose, UA NEGATIVE NEGATIVE mg/dL   Hgb urine dipstick SMALL (A) NEGATIVE   Bilirubin Urine NEGATIVE NEGATIVE   Ketones, ur NEGATIVE NEGATIVE mg/dL   Protein, ur NEGATIVE NEGATIVE mg/dL   Urobilinogen, UA 0.2 0.0 - 1.0 mg/dL   Nitrite POSITIVE (A) NEGATIVE   Leukocytes, UA TRACE (A) NEGATIVE  Urine rapid drug screen (hosp performed)     Status: Abnormal   Collection Time: 08/04/15 11:47 AM  Result Value Ref Range   Opiates NONE DETECTED NONE DETECTED   Cocaine NONE DETECTED NONE DETECTED   Benzodiazepines POSITIVE (A) NONE DETECTED   Amphetamines NONE DETECTED NONE DETECTED   Tetrahydrocannabinol NONE DETECTED NONE DETECTED   Barbiturates NONE DETECTED NONE DETECTED    Comment:        DRUG SCREEN FOR MEDICAL PURPOSES ONLY.  IF CONFIRMATION IS NEEDED FOR ANY PURPOSE, NOTIFY LAB WITHIN 5 DAYS.        LOWEST DETECTABLE LIMITS FOR URINE DRUG SCREEN Drug Class       Cutoff (ng/mL) Amphetamine      1000 Barbiturate      200 Benzodiazepine   867 Tricyclics       619 Opiates  300 Cocaine          300 THC              50   Pregnancy, urine     Status: None   Collection Time: 08/04/15 11:47 AM  Result Value Ref Range   Preg Test, Ur NEGATIVE NEGATIVE  Urine microscopic-add on     Status: Abnormal   Collection Time: 08/04/15 11:47 AM  Result Value Ref Range   WBC, UA 3-6 <3 WBC/hpf   RBC / HPF 3-6 <3 RBC/hpf   Bacteria, UA MANY (A) RARE  Urine culture     Status: None (Preliminary result)   Collection Time: 08/04/15 11:47 AM  Result Value Ref Range   Specimen Description URINE, CATHETERIZED    Special Requests NONE    Culture      TOO YOUNG TO READ Performed at [facility]    Report Status PENDING   Lactic acid, plasma      Status: Abnormal   Collection Time: 08/04/15 12:48 PM  Result Value Ref Range   Lactic Acid, Venous 3.9 (HH) 0.5 - 2.0 mmol/L    Comment: CRITICAL RESULT CALLED TO, READ BACK BY AND VERIFIED WITH: CARTER, J. AT 1344 ON 08/04/2015 BY AGUNDIZ, E.   Basic metabolic panel     Status: Abnormal   Collection Time: 08/04/15  2:06 PM  Result Value Ref Range   Sodium 143 135 - 145 mmol/L   Potassium 4.0 3.5 - 5.1 mmol/L   Chloride 111 101 - 111 mmol/L   CO2 21 (L) 22 - 32 mmol/L   Glucose, Bld 98 65 - 99 mg/dL   BUN 13 6 - 20 mg/dL   Creatinine, Ser 0.82 0.44 - 1.00 mg/dL   Calcium 7.6 (L) 8.9 - 10.3 mg/dL   GFR calc non Af Amer >60 >60 mL/min   GFR calc Af Amer >60 >60 mL/min    Comment: (NOTE) The eGFR has been calculated using the CKD EPI equation. This calculation has not been validated in all clinical situations. eGFR's persistently <60 mL/min signify possible Chronic Kidney Disease.    Anion gap 11 5 - 15  Lithium level     Status: Abnormal   Collection Time: 08/04/15  2:18 PM  Result Value Ref Range   Lithium Lvl 0.28 (L) 0.60 - 1.20 mmol/L  I-stat troponin, ED     Status: None   Collection Time: 08/05/15  1:25 AM  Result Value Ref Range   Troponin i, poc 0.00 0.00 - 0.08 ng/mL   Comment 3            Comment: Due to the release kinetics of cTnI, a negative result within the first hours of the onset of symptoms does not rule out myocardial infarction with certainty. If myocardial infarction is still suspected, repeat the test at appropriate intervals.   I-Stat CG4 Lactic Acid, ED     Status: None   Collection Time: 08/05/15  2:18 AM  Result Value Ref Range   Lactic Acid, Venous 1.57 0.5 - 2.0 mmol/L    Vitals: Blood pressure 126/69, pulse 66, temperature 98.2 F (36.8 C), temperature source Oral, resp. rate 18, last menstrual period 06/29/2015, SpO2 99 %.  Risk to Self: Suicidal Ideation: No Suicidal Intent: No Is patient at risk for suicide?: No Suicidal Plan?:  No Access to Means: Yes Specify Access to Suicidal Means:  (she has prescriptions ) What has been your use of drugs/alcohol within the last 12  months?:  (listerine and beer) How many times?:  (3) Other Self Harm Risks:  (none known) Triggers for Past Attempts: Unknown Intentional Self Injurious Behavior: None Risk to Others: Homicidal Ideation: No Thoughts of Harm to Others: No Current Homicidal Intent: No Current Homicidal Plan: No Access to Homicidal Means: No Identified Victim:  (none) History of harm to others?:  (unknown) Assessment of Violence:  (unknown pt would not answer) Violent Behavior Description:  (unknown) Does patient have access to weapons?:  (unknown) Criminal Charges Pending?:  (unknown) Does patient have a court date:  (unknown) Prior Inpatient Therapy: Prior Inpatient Therapy: Yes Prior Therapy Dates:  (May 2016, January 2016, august 2015) Prior Therapy Facilty/Provider(s):  [facility]) Reason for Treatment:  (detox and suicidal ideations) Prior Outpatient Therapy: Prior Outpatient Therapy: Yes Prior Therapy Dates:  (unknown pt could not remember) Prior Therapy Facilty/Provider(s):  (unknown) Reason for Treatment:  (bipolar) Does patient have an ACCT team?: No Does patient have Intensive In-House Services?  : No Does patient have [facility] services? : No Does patient have P4CC services?: No  Current Facility-Administered Medications  Medication Dose Route Frequency Provider Last Rate Last Dose  . 0.9 %  sodium chloride infusion   Intravenous Continuous [name], MD 125 mL/hr at 08/04/15 1510 1,000 mL at 08/04/15 1510  . amitriptyline (ELAVIL) tablet 100 mg  100 mg Oral QHS [name], MD   100 mg at 08/05/15 0043  . cephALEXin (KEFLEX) capsule 500 mg  500 mg Oral 4 times per day [name], MD   500 mg at 08/05/15 1238  . FLUoxetine (PROZAC) capsule 60 mg  60 mg Oral Daily [name], MD   60 mg at 08/05/15 1021  . gabapentin (NEURONTIN) capsule 400  mg  400 mg Oral TID [name], MD   400 mg at 08/05/15 1021  . levothyroxine (SYNTHROID, LEVOTHROID) tablet 150 mcg  150 mcg Oral QAC breakfast [name], MD   150 mcg at 08/05/15 0856  . lithium carbonate capsule 150 mg  150 mg Oral BID WC Dorie Rank, MD   150 mg at 08/05/15 1021  . LORazepam (ATIVAN) injection 1 mg  1 mg Intravenous Q30 min PRN [name], MD      . LORazepam (ATIVAN) tablet 0-4 mg  0-4 mg Oral Q12H Dorie Rank, MD   2 mg at 08/05/15 0726  . pantoprazole (PROTONIX) EC tablet 40 mg  40 mg Oral Daily [name], MD   40 mg at 08/05/15 1021   Current Outpatient Prescriptions  Medication Sig Dispense Refill  . amitriptyline (ELAVIL) 100 MG tablet Take 1 tablet (100 mg total) by mouth at bedtime. 30 tablet 0  . clonazePAM (KLONOPIN) 1 MG tablet Take 1 mg by mouth 2 (two) times daily.    Marland Kitchen FLUoxetine (PROZAC) 20 MG capsule Take 3 capsules (60 mg total) by mouth daily. 90 capsule 0  . gabapentin (NEURONTIN) 800 MG tablet Take 0.5 tablets (400 mg total) by mouth 3 (three) times daily. 90 tablet 0  . levothyroxine (SYNTHROID, LEVOTHROID) 150 MCG tablet Take 1 tablet (150 mcg total) by mouth daily before breakfast. 30 tablet 2  . lithium carbonate 150 MG capsule Take 1 capsule (150 mg total) by mouth 2 (two) times daily with a meal. 60 capsule 0  . omeprazole (PRILOSEC) 40 MG capsule Take 1 capsule (40 mg total) by mouth daily. 30 capsule 0  . traMADol (ULTRAM) 50 MG tablet Take 1 tablet (50 mg total) by mouth every 6 (six)  hours as needed. (Patient taking differently: Take 50 mg by mouth 2 (two) times daily. ) 15 tablet 0  . Multiple Vitamin (MULTIVITAMIN WITH MINERALS) TABS tablet Take 1 tablet by mouth daily. (Patient not taking: Reported on 03/12/2015) 30 tablet 0    Musculoskeletal: Strength & Muscle Tone: within normal limits Gait & Station: normal Patient leans: N/A  Psychiatric Specialty Exam: Physical Exam  Vitals reviewed. Psychiatric: Her mood appears anxious.  She exhibits a depressed mood.    Review of Systems  All other systems reviewed and are negative.   Blood pressure 126/69, pulse 66, temperature 98.2 F (36.8 C), temperature source Oral, resp. rate 18, last menstrual period 06/29/2015, SpO2 99 %.There is no weight on file to calculate BMI.  General Appearance: Neat  Eye Contact::  Fair  Speech:  Clear and Coherent  Volume:  Normal  Mood:  Euthymic  Affect:  Congruent  Thought Process:  Circumstantial  Orientation:  Full (Time, Place, and Person)  Thought Content:  Rumination  Suicidal Thoughts:  No  Homicidal Thoughts:  No  Memory:  Immediate;   Fair Recent;   Fair Remote;   Fair  Judgement:  Fair  Insight:  Fair  Psychomotor Activity:  Normal  Concentration:  Fair  Recall:  [employer] of Knowledge:Fair  Language: Fair  Akathisia:  No  Handed:  Right  AIMS (if indicated):     Assets:  Social Support  ADL's:  Intact  Cognition: WNL  Sleep:      Medical Decision Making: Review of Psycho-Social Stressors (1), Discuss test with performing physician (1), Decision to obtain old records (1), Review and summation of old records (2) and Review of Medication Regimen & Side Effects (2)  Treatment Plan Summary: 1.  Take all your medications as prescribed.              2.  Report any adverse side effects to outpatient provider.                       3.  Patient instructed to not use alcohol or illegal drugs while on prescription medicines.            4.  In the event of worsening symptoms, instructed patient to call 911, the crisis hotline or go to nearest emergency room for evaluation of symptoms.  Plan:  No evidence of imminent risk to self or others at present.   Patient does not meet criteria for psychiatric inpatient admission. Supportive therapy provided about ongoing stressors. Discussed crisis plan, support from social network, calling 911, coming to the Emergency Department, and calling Suicide Hotline.  Disposition:   May be discharged.  Does not meet inpatient criteria.  Dr Dwyane Dee concurs and agrees to plan.  Freda Munro May Daquon Greenleaf AGNP-BC 08/05/2015 12:41 PM

## 2015-08-05 NOTE — ED Notes (Signed)
Pt is awake, asking for a phone to call family.
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
## 2015-08-05 NOTE — ED Notes (Signed)
TTS in progress 

## 2015-08-05 NOTE — ED Notes (Signed)
Pt weaned to 4 liters O2 n/c

## 2015-08-05 NOTE — Progress Notes (Signed)
Pt taken off NRB and placed on 3L [place]. RT will continue to monitor.,

## 2015-08-05 NOTE — ED Notes (Signed)
Pt removed her collar, and was out in the hallway. This RN helped the pt to the restroom. Pt had steady gait.

## 2015-08-05 NOTE — Discharge Instructions (Signed)
Alcohol Intoxication  Alcohol intoxication occurs when the amount of alcohol that a person has consumed impairs his or her ability to mentally and physically function. Alcohol directly impairs the normal chemical activity of the brain. Drinking large amounts of alcohol can lead to changes in mental function and behavior, and it can cause many physical effects that can be harmful.   Alcohol intoxication can range in severity from mild to very severe. Various factors can affect the level of intoxication that occurs, such as the person's age, gender, weight, frequency of alcohol consumption, and the presence of other medical conditions (such as diabetes, seizures, or heart conditions). Dangerous levels of alcohol intoxication may occur when people drink large amounts of alcohol in a short period (binge drinking). Alcohol can also be especially dangerous when combined with certain prescription medicines or "recreational" drugs.  SIGNS AND SYMPTOMS  Some common signs and symptoms of mild alcohol intoxication include:  · Loss of coordination.  · Changes in mood and behavior.  · Impaired judgment.  · Slurred speech.  As alcohol intoxication progresses to more severe levels, other signs and symptoms will appear. These may include:  · Vomiting.  · Confusion and impaired memory.  · Slowed breathing.  · Seizures.  · Loss of consciousness.  DIAGNOSIS   Your health care provider will take a medical history and perform a physical exam. You will be asked about the amount and type of alcohol you have consumed. Blood tests will be done to measure the concentration of alcohol in your blood. In many places, your blood alcohol level must be lower than 80 mg/dL (0.08%) to legally drive. However, many dangerous effects of alcohol can occur at much lower levels.   TREATMENT   People with alcohol intoxication often do not require treatment. Most of the effects of alcohol intoxication are temporary, and they go away as the alcohol naturally  leaves the body. Your health care provider will monitor your condition until you are stable enough to go home. Fluids are sometimes given through an IV access tube to help prevent dehydration.   HOME CARE INSTRUCTIONS  · Do not drive after drinking alcohol.  · Stay hydrated. Drink enough water and fluids to keep your urine clear or pale yellow. Avoid caffeine.    · Only take over-the-counter or prescription medicines as directed by your health care provider.    SEEK MEDICAL CARE IF:   · You have persistent vomiting.    · You do not feel better after a few days.  · You have frequent alcohol intoxication. Your health care provider can help determine if you should see a substance use treatment counselor.  SEEK IMMEDIATE MEDICAL CARE IF:   · You become shaky or tremble when you try to stop drinking.    · You shake uncontrollably (seizure).    · You throw up (vomit) blood. This may be bright red or may look like black coffee grounds.    · You have blood in your stool. This may be bright red or may appear as a black, tarry, bad smelling stool.    · You become lightheaded or faint.    MAKE SURE YOU:   · Understand these instructions.  · Will watch your condition.  · Will get help right away if you are not doing well or get worse.     This information is not intended to replace advice given to you by your health care provider. Make sure you discuss any questions you have with your health care provider.       Document Released: 07/04/2005 Document Revised: 05/27/2013 Document Reviewed: 02/27/2013  Elsevier Interactive Patient Education ©2016 [employer]

## 2015-08-05 NOTE — Discharge Instructions (Signed)
General Assault Assault includes any behavior or physical attack--whether it is on purpose or not--that results in injury to another person, damage to property, or both. This also includes assault that has not yet happened, but is planned to happen. Threats of assault may be physical, verbal, or written. They may be said or sent by:  Mail.  E-mail.  Text.  Social media.  Fax. The threats may be direct, implied, or understood. WHAT ARE THE DIFFERENT FORMS OF ASSAULT? Forms of assault include:  Physically assaulting a person. This includes physical threats to inflict physical harm as well as:  Slapping.  Hitting.  Poking.  Kicking.  Punching.  Pushing.  Sexually assaulting a person. Sexual assault is any sexual activity that a person is forced, threatened, or coerced to participate in. It may or may not involve physical contact with the person who is assaulting you. You are sexually assaulted if you are forced to have sexual contact of any kind.  Damaging or destroying a person's assistive equipment, such as glasses, canes, or walkers.  Throwing or hitting objects.  Using or displaying a weapon to harm or threaten someone.  Using or displaying an object that appears to be a weapon in a threatening manner.  Using greater physical size or strength to intimidate someone.  Making intimidating or threatening gestures.  Bullying.  Hazing.  Using language that is intimidating, threatening, hostile, or abusive.  Stalking.  Restraining someone with force. WHAT SHOULD I DO IF I EXPERIENCE ASSAULT?  Report assaults, threats, and stalking to the police. Call your local emergency services (911 in the U.S.) if you are in immediate danger or you need medical help.  You can work with a Chief Executive Officer or an advocate to get legal protection against someone who has assaulted you or threatened you with assault. Protection includes restraining orders and private addresses. Crimes against  you, such as assault, can also be prosecuted through the courts. Laws will vary depending on where you live.   This information is not intended to replace advice given to you by your health care provider. Make sure you discuss any questions you have with your health care provider.   Document Released: 09/24/2005 Document Revised: 10/15/2014 Document Reviewed: 06/11/2014 Elsevier Interactive Patient Education 2016 [place], Adult A concussion is a brain injury. It is caused by:  A hit to the head.  A quick and sudden movement (jolt) of the head or neck. A concussion is usually not life threatening. Even so, it can cause serious problems. If you had a concussion before, you may have concussion-like problems after a hit to your head. HOME CARE General Instructions  Follow your doctor's directions carefully.  Take medicines only as told by your doctor.  Only take medicines your doctor says are safe.  Do not drink alcohol until your doctor says it is okay. Alcohol and some drugs can slow down healing. They can also put you at risk for further injury.  If you are having trouble remembering things, write them down.  Try to do one thing at a time if you get distracted easily. For example, do not watch TV while making dinner.  Talk to your family members or close friends when making important decisions.  Follow up with your doctor as told.  Watch your symptoms. Tell others to do the same. Serious problems can sometimes happen after a concussion. Older adults are more likely to have these problems.  Tell your teachers, school nurse, school counselor, coach,  Product/process development scientist, or work Freight forwarder about your concussion. Tell them about what you can or cannot do. They should watch to see if:  It gets even harder for you to pay attention or concentrate.  It gets even harder for you to remember things or learn new things.  You need more time than normal to finish things.  You  become annoyed (irritable) more than before.  You are not able to deal with stress as well.  You have more problems than before.  Rest. Make sure you:  Get plenty of sleep at night.  Go to sleep early.  Go to bed at the same time every day. Try to wake up at the same time.  Rest during the day.  Take naps when you feel tired.  Limit activities where you have to think a lot or concentrate. These include:  Doing homework.  Doing work related to a job.  Watching TV.  Using the computer. Returning To Your Regular Activities Return to your normal activities slowly, not all at once. You must give your body and brain enough time to heal.   Do not play sports or do other athletic activities until your doctor says it is okay.  Ask your doctor when you can drive, ride a bicycle, or work other vehicles or machines. Never do these things if you feel dizzy.  Ask your doctor about when you can return to work or school. Preventing Another Concussion It is very important to avoid another brain injury, especially before you have healed. In rare cases, another injury can lead to permanent brain damage, brain swelling, or death. The risk of this is greatest during the first 7-10 days after your injury. Avoid injuries by:   Wearing a seat belt when riding in a car.  Not drinking too much alcohol.  Avoiding activities that could lead to a second concussion (such as contact sports).  Wearing a helmet when doing activities like:  Biking.  Skiing.  Skateboarding.  Skating.  Making your home safer by:  Removing things from the floor or stairways that could make you trip.  Using grab bars in bathrooms and handrails by stairs.  Placing non-slip mats on floors and in bathtubs.  Improve lighting in dark areas. GET HELP IF:  It gets even harder for you to pay attention or concentrate.  It gets even harder for you to remember things or learn new things.  You need more time than  normal to finish things.  You become annoyed (irritable) more than before.  You are not able to deal with stress as well.  You have more problems than before.  You have problems keeping your balance.  You are not able to react quickly when you should. Get help if you have any of these problems for more than 2 weeks:   Lasting (chronic) headaches.  Dizziness or trouble balancing.  Feeling sick to your stomach (nausea).  Seeing (vision) problems.  Being affected by noises or light more than normal.  Feeling sad, low, down in the dumps, blue, gloomy, or empty (depressed).  Mood changes (mood swings).  Feeling of fear or nervousness about what may happen (anxiety).  Feeling annoyed.  Memory problems.  Problems concentrating or paying attention.  Sleep problems.  Feeling tired all the time. GET HELP RIGHT AWAY IF:   You have bad headaches or your headaches get worse.  You have weakness (even if it is in one hand, leg, or part of the face).  You  have loss of feeling (numbness).  You feel off balance.  You keep throwing up (vomiting).  You feel tired.  One black center of your eye (pupil) is larger than the other.  You twitch or shake violently (convulse).  Your speech is not clear (slurred).  You are more confused, easily angered (agitated), or annoyed than before.  You have more trouble resting than before.  You are unable to recognize people or places.  You have neck pain.  It is difficult to wake you up.  You have unusual behavior changes.  You pass out (lose consciousness). MAKE SURE YOU:   Understand these instructions.  Will watch your condition.  Will get help right away if you are not doing well or get worse.   This information is not intended to replace advice given to you by your health care provider. Make sure you discuss any questions you have with your health care provider.   Document Released: 09/12/2009 Document Revised:  10/15/2014 Document Reviewed: 04/16/2013 Elsevier Interactive Patient Education 2016 [employer].    Emergency Department Resource Guide 1) Find a Doctor and Pay Out of Pocket Although you won't have to find out who is covered by your insurance plan, it is a good idea to ask around and get recommendations. You will then need to call the office and see if the doctor you have chosen will accept you as a new patient and what types of options they offer for patients who are self-pay. Some doctors offer discounts or will set up payment plans for their patients who do not have insurance, but you will need to ask so you aren't surprised when you get to your appointment.  2) Contact Your Local Health Department Not all health departments have doctors that can see patients for sick visits, but many do, so it is worth a call to see if yours does. If you don't know where your local health department is, you can check in your phone book. The CDC also has a tool to help you locate your state's health department, and many state websites also have listings of all of their local health departments.  3) Find a [place] Clinic If your illness is not likely to be very severe or complicated, you may want to try a walk in clinic. These are popping up all over the country in pharmacies, drugstores, and shopping centers. They're usually staffed by nurse practitioners or physician assistants that have been trained to treat common illnesses and complaints. They're usually fairly quick and inexpensive. However, if you have serious medical issues or chronic medical problems, these are probably not your best option.  No Primary Care Doctor: - Call Health Connect at  [phone] - they can help you locate a primary care doctor that  accepts your insurance, provides certain services, etc. - Physician Referral Service- [phone]  Chronic Pain Problems: Organization         Address  Phone   Notes  [facility]  [phone] Patients need to be referred by their primary care doctor.   Medication Assistance: Organization         Address  Phone   Notes  [facility] Medication [facility] [address] [place] [place],  [address] [phone] --Must be a resident of [facility] -- Must have NO insurance coverage whatsoever (no Medicaid/ Medicare, etc.) -- The pt. MUST have a primary care doctor that directs their care regularly and follows them in the  community   MedAssist  [phone]   [place]  [phone]    Agencies that provide inexpensive medical care: Organization         Address  Phone   Notes  [place]  [phone]   [facility]    [phone]   [facility] [place], [place] [address] [phone]   [place] [address], [place] [phone]   Planned Parenthood    [phone]   [facility]    [phone]   [place] and [place] [address], [place] Phone:  [phone], Fax:  [phone] Hours of Operation:  9 am - 6 pm, M-F.  Also accepts Medicaid/Medicare and self-pay.  [facility] for [place], [address], [place] Phone: [phone], Fax: [phone]. Hours of Operation:  8:30 am - 5:30 pm, M-F.  Also accepts Medicaid and self-pay.  [facility] [place] [address], [place] Phone: [phone]   [place], [place], [place] [phone], Ext. [phone] Mondays & Thursdays: 7-9 AM.  First 15 patients are seen on a first come, first serve basis.    [place] Providers:  Organization         Address  Phone   Notes  [facility] [address], [place] [phone] Also accepts self-pay patients.  [facility] [address], [place]  [phone]   [place], [address], [place] [phone]   [facility] [address] [place] [phone]   Lucianne Lei [address], [place]   [phone] Only accepts [place] Access [place] patients after they have their name applied to their card.   Self-Pay (no insurance) in [facility]:  Organization         Address  Phone   Notes  Sickle Cell Patients, [facility] Internal Medicine [place] [phone]   [facility] [place] [phone]   [facility] [place]  [place], [place], [place] [phone]   [facility]/Dr. Osei-Bonsu  [address], [place] or [address], [place] [phone] Phone number for both [place] and [place] locations is the same.  [facility] [address], [place] [phone]   [facility] [address], [place] or [address] [phone]   [facility] [address], [place] [phone], phone; [phone], fax Sees patients 1st and 3rd Saturday of every month.  Must not qualify for public or private insurance (i.e. Medicaid, Medicare, [place] Health Choice, Veterans' Benefits)  Household income should be no more than 200% of the poverty level The clinic cannot treat you if you are pregnant or think you are pregnant  Sexually transmitted diseases are not treated at the clinic.    Dental Care: Organization         Address  Phone  Notes  [facility] Department of [facility] [place] [phone] Accepts children up to age 37 who are enrolled in [place] or [place]  Health Choice; pregnant women with a Medicaid card; and children who have applied for Medicaid or [place] [facility]  Choice, but were declined, whose parents can pay a reduced fee at time of service.  [facility] Department of [facility]  [address], [place] [phone] Accepts children up to age 45 who are enrolled in [place] or [place]; pregnant women with a Medicaid card; and children who have applied for Medicaid or [place] Health Choice, but were declined, whose parents can pay a reduced fee at time of service.  [place] Adult Dental Access PROGRAM  [place] [phone] Patients are seen by appointment only. Walk-ins are not accepted. [place] will see patients 52 years of age and older. Monday - Tuesday (8am-5pm) Most Wednesdays (8:30-5pm) $30 per visit, cash only  [facility] Adult Dental Access PROGRAM  [address], [facility] [phone] Patients are seen by appointment only. Walk-ins are not accepted. [place] will see patients 33 years of age and older. One Wednesday Evening (Monthly: Volunteer Based).  $30 per visit, cash only  [place]  [phone] for adults; Children under age 58, call Graduate Pediatric Dentistry at [phone]. Children aged 39-14, please call [phone] to request a pediatric application.  Dental services are provided in all areas of dental care including fillings, crowns and bridges, complete and partial dentures, implants, gum treatment, root canals, and extractions. Preventive care is also provided. Treatment is provided to both adults and children. Patients are selected via a lottery and there is often a waiting list.   [facility] [address], [place]  [phone] [email or web address]   [facility] [address] [place], [place] [phone], Ext. [phone] Second and Fourth Thursday of each month, opens at 6:30 AM; Clinic ends at 9 AM.  Patients are seen on a first-come first-served basis, and a limited number are seen during each  clinic.   [facility]  [address] [place], [place] [phone]   Eligibility Requirements You must have lived in [place], [place], or [place] counties for at least the last three months.   You cannot be eligible for state or federal sponsored [employer], including [employer], [place], or [employer].   You generally cannot be eligible for healthcare insurance through your employer.    How to apply: Eligibility screenings are held every Tuesday and Wednesday afternoon from 1:00 pm until 4:00 pm. You do not need an appointment for the interview!  [facility] [address] [place], [place]   [place]  [place] Department  [place]  [phone]    Behavioral Health Resources in the Community: Intensive Outpatient Programs Organization         Address  Phone  Notes  [place]. [address], [place], [place] [phone]   [facility] Outpatient [address], [place], [place]   ADS: Alcohol & Drug Svcs [address], [place], [place]   [place] [address]  [place], [place] or [phone]   Substance Abuse Resources Organization         Address  Phone  Notes  Alcohol and Drug Services  [place]  [phone]   [facility]  [phone]   Chinita Pester  [phone]   Residential & Outpatient Substance Abuse Program  [phone]   Psychological Services Organization         Address  Phone  Notes  [place]  [phone]   [place] [address], [place] or [phone]    Mobile Crisis Teams Organization         Address  Phone  Notes  [facility]rapeutic Alternatives, Mobile  Crisis Care Unit  [phone]   [facility] Psychotherapeutic Services  [address] [place], [place]   [place] Levels [address], [place] [phone]    Self-Help/Support Groups Organization         Address  Phone             Notes  [place]. of [place] - variety of support groups  [place] Call for more information  Narcotics Anonymous (NA), [facility] [address], [employer] [place]  2 meetings at this location   Special educational needs teacher         Address  Phone  Notes  ASAP Residential Treatment [place],    [place]  [phone]   [facility]  [address] [place] [address]882, [place], [place]   [place], [place] [phone] Admissions: 8am-3pm M-F  Incentives Substance [place] [address],    [place], [place] [phone]   [facility] [address] [place], [place], [place]   [facility] [address],  [place], [place]   [facility]ight Programs - Intensive Outpatient [address], [place], [place]   [facility].,  [place], [place] [phone] or [phone]   [facility] Services (RTS) [address], [place], [place] Accepts Medicaid  [facility] [place] [address],  [place] [phone] Substance Abuse/Addiction Treatment   [facility] Organization         Address  Phone  Notes  [facility]  [phone]   Domenic Schwab, PhD [address] [place], [place]   [phone] or [phone]   [place], [place] [phone]   [facility] [address], [place], [place] [phone] Insurance/Medicaid/sponsorship through [facility] and Families [address], [address]  [place]                                    [place], [place] [phone] [place] [address], [place] [phone]    Dr. Adele Schilder  [phone]   [facility] Dept. 1) [address], [place] 2) [place] 3)  [place] [address], [place] [phone]  [phone]   [place] [phone] or [phone] (After Hours)

## 2015-08-05 NOTE — ED Notes (Signed)
Pt transported back from ct scan
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
## 2015-08-06 LAB — SAMPLE TO BLOOD BANK

## 2015-08-06 NOTE — ED Notes (Signed)
Pt refused bacitracin ointment on her wound.

## 2015-08-07 ENCOUNTER — Encounter (HOSPITAL_COMMUNITY): Payer: Self-pay | Admitting: Emergency Medicine

## 2015-08-07 LAB — URINE CULTURE: Culture: >=100000

## 2015-08-08 ENCOUNTER — Encounter (HOSPITAL_COMMUNITY): Payer: Self-pay | Admitting: Emergency Medicine

## 2015-08-09 ENCOUNTER — Telehealth (HOSPITAL_COMMUNITY): Payer: Self-pay

## 2015-08-09 NOTE — Telephone Encounter (Signed)
Post ED Visit - Positive Culture Follow-up  Culture report reviewed by antimicrobial stewardship pharmacist:  []  Heide Guile, Pharm.D., BCPS []  Alycia Rossetti, Pharm.D., BCPS []  [place], Pharm.D., BCPS, AAHIVP [x]  Legrand Como, Pharm.D., BCPS, AAHIVP []  Kief, Pharm.D. []  Milus Glazier, [place].D.  Positive urine culture, >/= 100,000 colonies -> E Coli Treated with Cephalexin, organism sensitive to the same and no further patient follow-up is required at this time.  Dortha Kern 08/09/2015, 4:48 AM

## 2015-09-09 ENCOUNTER — Emergency Department (HOSPITAL_COMMUNITY)
Admission: EM | Admit: 2015-09-09 | Discharge: 2015-09-09 | Discharge status: Against Medical Advice | Payer: Self-pay | Attending: Emergency Medicine | Admitting: Emergency Medicine

## 2015-09-09 ENCOUNTER — Encounter (HOSPITAL_COMMUNITY): Payer: Self-pay | Admitting: *Deleted

## 2015-09-09 ENCOUNTER — Encounter (HOSPITAL_COMMUNITY): Payer: Self-pay

## 2015-09-09 ENCOUNTER — Emergency Department (HOSPITAL_COMMUNITY)
Admission: EM | Admit: 2015-09-09 | Discharge: 2015-09-09 | Disposition: A | Discharge status: Home / Self Care | Payer: Self-pay | Attending: Emergency Medicine | Admitting: Emergency Medicine

## 2015-09-09 DIAGNOSIS — Z8711 Personal history of peptic ulcer disease: Secondary | ICD-10-CM | POA: Insufficient documentation

## 2015-09-09 DIAGNOSIS — F17210 Nicotine dependence, cigarettes, uncomplicated: Secondary | ICD-10-CM | POA: Insufficient documentation

## 2015-09-09 DIAGNOSIS — Z792 Long term (current) use of antibiotics: Secondary | ICD-10-CM | POA: Insufficient documentation

## 2015-09-09 DIAGNOSIS — Z791 Long term (current) use of non-steroidal anti-inflammatories (NSAID): Secondary | ICD-10-CM | POA: Insufficient documentation

## 2015-09-09 DIAGNOSIS — Z8639 Personal history of other endocrine, nutritional and metabolic disease: Secondary | ICD-10-CM | POA: Insufficient documentation

## 2015-09-09 DIAGNOSIS — Z79899 Other long term (current) drug therapy: Secondary | ICD-10-CM | POA: Insufficient documentation

## 2015-09-09 DIAGNOSIS — E039 Hypothyroidism, unspecified: Secondary | ICD-10-CM | POA: Insufficient documentation

## 2015-09-09 DIAGNOSIS — F329 Major depressive disorder, single episode, unspecified: Secondary | ICD-10-CM | POA: Insufficient documentation

## 2015-09-09 DIAGNOSIS — Z3202 Encounter for pregnancy test, result negative: Secondary | ICD-10-CM | POA: Insufficient documentation

## 2015-09-09 DIAGNOSIS — F10920 Alcohol use, unspecified with intoxication, uncomplicated: Secondary | ICD-10-CM

## 2015-09-09 DIAGNOSIS — F1310 Sedative, hypnotic or anxiolytic abuse, uncomplicated: Secondary | ICD-10-CM | POA: Insufficient documentation

## 2015-09-09 DIAGNOSIS — R4781 Slurred speech: Secondary | ICD-10-CM | POA: Insufficient documentation

## 2015-09-09 DIAGNOSIS — F10129 Alcohol abuse with intoxication, unspecified: Secondary | ICD-10-CM | POA: Insufficient documentation

## 2015-09-09 DIAGNOSIS — R000 Tachycardia, unspecified: Secondary | ICD-10-CM | POA: Insufficient documentation

## 2015-09-09 DIAGNOSIS — Z862 Personal history of diseases of the blood and blood-forming organs and certain disorders involving the immune mechanism: Secondary | ICD-10-CM | POA: Insufficient documentation

## 2015-09-09 DIAGNOSIS — J029 Acute pharyngitis, unspecified: Secondary | ICD-10-CM | POA: Insufficient documentation

## 2015-09-09 LAB — COMPREHENSIVE METABOLIC PANEL
ALBUMIN: 4 g/dL (ref 3.5–5.0)
ALK PHOS: 140 U/L — AB (ref 38–126)
ALT: 148 U/L — AB (ref 14–54)
ALT: 180 U/L — ABNORMAL HIGH (ref 14–54)
AST: 154 U/L — ABNORMAL HIGH (ref 15–41)
AST: 267 U/L — AB (ref 15–41)
Albumin: 4.3 g/dL (ref 3.5–5.0)
Alkaline Phosphatase: 141 U/L — ABNORMAL HIGH (ref 38–126)
Anion gap: 17 — ABNORMAL HIGH (ref 5–15)
Anion gap: 21 — ABNORMAL HIGH (ref 5–15)
BUN: 9 mg/dL (ref 6–20)
BUN: 9 mg/dL (ref 6–20)
CHLORIDE: 101 mmol/L (ref 101–111)
CHLORIDE: 99 mmol/L — AB (ref 101–111)
CO2: 20 mmol/L — AB (ref 22–32)
CO2: 19 mmol/L — ABNORMAL LOW (ref 22–32)
Calcium: 8.2 mg/dL — ABNORMAL LOW (ref 8.9–10.3)
Calcium: 8.3 mg/dL — ABNORMAL LOW (ref 8.9–10.3)
Creatinine, Ser: 0.88 mg/dL (ref 0.44–1.00)
Creatinine, Ser: 0.92 mg/dL (ref 0.44–1.00)
GFR calc non Af Amer: >60 mL/min (ref >60)
GLUCOSE: 104 mg/dL — AB (ref 65–99)
Glucose, Bld: 78 mg/dL (ref 65–99)
POTASSIUM: 3.3 mmol/L — AB (ref 3.5–5.1)
Potassium: 3 mmol/L — ABNORMAL LOW (ref 3.5–5.1)
SODIUM: 138 mmol/L (ref 135–145)
Sodium: 139 mmol/L (ref 135–145)
Total Bilirubin: 0.8 mg/dL (ref 0.3–1.2)
Total Bilirubin: 0.6 mg/dL (ref 0.3–1.2)
Total Protein: 7.4 g/dL (ref 6.5–8.1)
Total Protein: 7.5 g/dL (ref 6.5–8.1)

## 2015-09-09 LAB — URINALYSIS, ROUTINE W REFLEX MICROSCOPIC
Bilirubin Urine: NEGATIVE
GLUCOSE, UA: 100 mg/dL — AB
KETONES UR: 15 mg/dL — AB
LEUKOCYTES UA: NEGATIVE
NITRITE: POSITIVE — AB
PROTEIN: 100 mg/dL — AB
Specific Gravity, Urine: 1.01 (ref 1.005–1.030)
pH: 5.5 (ref 5.0–8.0)

## 2015-09-09 LAB — CBC WITH DIFFERENTIAL/PLATELET
BASOS PCT: 1 %
Basophils Absolute: 0.1 10*3/uL (ref 0.0–0.1)
EOS ABS: 0.3 10*3/uL (ref 0.0–0.7)
Eosinophils Relative: 4 %
HCT: 39.2 % (ref 36.0–46.0)
HEMOGLOBIN: 12.8 g/dL (ref 12.0–15.0)
Lymphocytes Relative: 14 %
Lymphs Abs: 1.1 10*3/uL (ref 0.7–4.0)
MCH: 30.6 pg (ref 26.0–34.0)
MCHC: 32.7 g/dL (ref 30.0–36.0)
MCV: 93.8 fL (ref 78.0–100.0)
Monocytes Absolute: 0.7 10*3/uL (ref 0.1–1.0)
Monocytes Relative: 9 %
NEUTROS PCT: 71 %
Neutro Abs: 5.6 10*3/uL (ref 1.7–7.7)
Platelets: 404 10*3/uL — ABNORMAL HIGH (ref 150–400)
RBC: 4.18 MIL/uL (ref 3.87–5.11)
RDW: 17.5 % — ABNORMAL HIGH (ref 11.5–15.5)
WBC: 7.8 10*3/uL (ref 4.0–10.5)

## 2015-09-09 LAB — ACETAMINOPHEN LEVEL

## 2015-09-09 LAB — PROTIME-INR
INR: 1.08 (ref 0.00–1.49)
PROTHROMBIN TIME: 14.2 s (ref 11.6–15.2)

## 2015-09-09 LAB — URINE MICROSCOPIC-ADD ON

## 2015-09-09 LAB — RAPID URINE DRUG SCREEN, HOSP PERFORMED
Amphetamines: NOT DETECTED
BARBITURATES: NOT DETECTED
Benzodiazepines: POSITIVE — AB
COCAINE: NOT DETECTED
OPIATES: NOT DETECTED
Tetrahydrocannabinol: NOT DETECTED

## 2015-09-09 LAB — RAPID STREP SCREEN (MED CTR MEBANE ONLY): Streptococcus, Group A Screen (Direct): NEGATIVE

## 2015-09-09 LAB — CBC
HEMATOCRIT: 38.4 % (ref 36.0–46.0)
Hemoglobin: 12.7 g/dL (ref 12.0–15.0)
MCH: 30.5 pg (ref 26.0–34.0)
MCHC: 33.1 g/dL (ref 30.0–36.0)
MCV: 92.3 fL (ref 78.0–100.0)
Platelets: 379 10*3/uL (ref 150–400)
RBC: 4.16 MIL/uL (ref 3.87–5.11)
RDW: 17.4 % — ABNORMAL HIGH (ref 11.5–15.5)
WBC: 7.9 10*3/uL (ref 4.0–10.5)

## 2015-09-09 LAB — ETHANOL
ALCOHOL ETHYL (B): 426 mg/dL — AB (ref <5)
Alcohol, Ethyl (B): 464 mg/dL (ref <5)

## 2015-09-09 LAB — SALICYLATE LEVEL: Salicylate Lvl: <4 mg/dL (ref 2.8–30.0)

## 2015-09-09 LAB — PREGNANCY, URINE: Preg Test, Ur: NEGATIVE

## 2015-09-09 MED ORDER — FOSFOMYCIN TROMETHAMINE 3 G PO PACK
PACK | ORAL | Status: AC
  Filled 2015-09-09: qty 3

## 2015-09-09 MED ORDER — LORAZEPAM 2 MG/ML IJ SOLN: 0.0000 mg | Freq: Four times a day (QID) | INTRAMUSCULAR | Status: DC

## 2015-09-09 MED ORDER — FOSFOMYCIN TROMETHAMINE 3 G PO PACK
3.0000 g | PACK | Freq: Once | ORAL | Status: AC
  Administered 2015-09-09: 3 g via ORAL
  Filled 2015-09-09: qty 3

## 2015-09-09 MED ORDER — LORAZEPAM 1 MG PO TABS: 1.0000 mg | ORAL_TABLET | Freq: Three times a day (TID) | ORAL | Status: DC | PRN

## 2015-09-09 MED ORDER — LORAZEPAM 1 MG PO TABS
0.0000 mg | ORAL_TABLET | Freq: Two times a day (BID) | ORAL | Status: DC
Start: 2015-09-09 — End: 2015-09-10
  Administered 2015-09-09: 2 mg via ORAL
  Filled 2015-09-09: qty 2

## 2015-09-09 MED ORDER — CHLORDIAZEPOXIDE HCL 25 MG PO CAPS: ORAL_CAPSULE | ORAL | Status: DC

## 2015-09-09 MED ORDER — LORAZEPAM 2 MG/ML IJ SOLN
0.0000 mg | Freq: Two times a day (BID) | INTRAMUSCULAR | Status: DC
Start: 2015-09-09 — End: 2015-09-10

## 2015-09-09 MED ORDER — SODIUM CHLORIDE 0.9 % IV SOLN: INTRAVENOUS | Status: DC

## 2015-09-09 MED ORDER — LORAZEPAM 1 MG PO TABS: 0.0000 mg | ORAL_TABLET | Freq: Four times a day (QID) | ORAL | Status: DC

## 2015-09-09 MED ORDER — VITAMIN B-1 100 MG PO TABS
100.0000 mg | ORAL_TABLET | Freq: Every day | ORAL | Status: DC
  Administered 2015-09-09: 100 mg via ORAL
  Filled 2015-09-09: qty 1

## 2015-09-09 MED ORDER — SODIUM CHLORIDE 0.9 % IV BOLUS (SEPSIS): 1000.0000 mL | Freq: Once | INTRAVENOUS | Status: DC

## 2015-09-09 MED ORDER — CHLORDIAZEPOXIDE HCL 25 MG PO CAPS
50.0000 mg | ORAL_CAPSULE | Freq: Once | ORAL | Status: AC
  Administered 2015-09-09: 50 mg via ORAL
  Filled 2015-09-09: qty 2

## 2015-09-09 MED ORDER — THIAMINE HCL 100 MG/ML IJ SOLN: 100.0000 mg | Freq: Every day | INTRAMUSCULAR | Status: DC

## 2015-09-09 NOTE — ED Notes (Signed)
Pt left without being seen post triage.

## 2015-09-09 NOTE — ED Notes (Signed)
Pt was ambulatory at discharge with stable gait. Pt states she just got tired of laying on the stretcher, states she will return as needed.  Pt nad

## 2015-09-09 NOTE — ED Provider Notes (Signed)
CSN: [id number]     Arrival date & time 09/09/15  0106 History   First MD Initiated Contact with Patient 09/09/15 0220   Chief Complaint  Patient presents with  . Alcohol Intoxication   Level V caveat for intoxication  (Consider location/radiation/quality/duration/timing/severity/associated sxs/prior Treatment) HPI patient told the triage nurse she needs to get detox from alcohol. When I entered the room she states "I want to home", "I can rehabilitation myself" and when I ask her why she came in today she states "I forgot". Patient states she walked to the ED tonight. Patient did tell me she was sober for 7 years and she has been drinking for the past month however reviewing her chart she's had multiple ED visits for alcohol intoxication since January of this year.   PCP [facility] Department   Past Medical History  Diagnosis Date  . Depression   . Peptic ulcer   . Alcohol abuse   . Anemia   . Benzodiazepine abuse   . Thyroid disease   . Alcoholism ([place])   . Narcotic abuse   . Back pain   . Thrombocytopenia ([place]) 06/17/2011  . Hypothyroidism   . Seizures ([place])   . Hypoglycemia   . Suicide attempt [facility])     multiple times   Past Surgical History  Procedure Laterality Date  . Cholecystectomy    . Abdominal surgery    . Esophagogastroduodenoscopy    . Gastric bypass    . Tubal ligation    . Esophagogastroduodenoscopy N/A 03/23/2013    Procedure: ESOPHAGOGASTRODUODENOSCOPY (EGD);  Surgeon: Ladene Artist, MD;  Location: [facility];  Service: Endoscopy;  Laterality: N/A;   Family History  Problem Relation Age of Onset  . Alcohol abuse Father   . Alcoholism Father   . Cancer Other    Social History  Substance Use Topics  . Smoking status: None  . Smokeless tobacco: None  . Alcohol Use: Yes     Comment: drink 4 liters a day. 1 case/day drinks Listerine/ 21 days sober today 03/12/15, per pt   Unemployed  OB History    Gravida Para Term Preterm AB TAB  SAB Ectopic Multiple Living   3 3 0 3 0 0 0 0       Review of Systems  All other systems reviewed and are negative.     Allergies  Nsaids  Home Medications   Prior to Admission medications   Medication Sig Start Date End Date Taking? Authorizing Provider  amitriptyline (ELAVIL) 100 MG tablet Take 1 tablet (100 mg total) by mouth at bedtime. 02/23/15  Yes [name], NP  clonazePAM (KLONOPIN) 1 MG tablet Take 1 mg by mouth 2 (two) times daily.   Yes Historical Provider, MD  FLUoxetine (PROZAC) 20 MG capsule Take 3 capsules (60 mg total) by mouth daily. 02/23/15  Yes [name], NP  levothyroxine (SYNTHROID, LEVOTHROID) 150 MCG tablet Take 1 tablet (150 mcg total) by mouth daily before breakfast. 02/23/15  Yes [name], NP  lithium carbonate 150 MG capsule Take 1 capsule (150 mg total) by mouth 2 (two) times daily with a meal. 02/23/15  Yes [name], NP  omeprazole (PRILOSEC) 40 MG capsule Take 1 capsule (40 mg total) by mouth daily. 02/23/15  Yes [name], NP  traMADol (ULTRAM) 50 MG tablet Take 1 tablet (50 mg total) by mouth every 6 (six) hours as needed. Patient taking differently: Take 50 mg by mouth 2 (two)  times daily.  07/07/15  Yes [name], PA-C  cephALEXin (KEFLEX) 500 MG capsule Take 1 capsule (500 mg total) by mouth 4 (four) times daily. 08/05/15   Ezequiel Essex, MD  gabapentin (NEURONTIN) 400 MG capsule Take 1 capsule (400 mg total) by mouth 3 (three) times daily. 08/05/15   Ezequiel Essex, MD  Multiple Vitamin (MULTIVITAMIN WITH MINERALS) TABS tablet Take 1 tablet by mouth daily. Patient not taking: Reported on 03/12/2015 02/23/15   [name], NP   BP 123/74 mmHg  Pulse 106  Temp(Src) 98.5 F (36.9 C) (Oral)  Resp 16  Ht 5\' 9"  (1.753 m)  Wt 200 lb (90.719 kg)  BMI 29.52 kg/m2  SpO2 96%  LMP 08/10/2015  Vital signs normal   Physical Exam  Constitutional: She appears well-developed and well-nourished.  Non-toxic appearance.  She does not appear ill. No distress.  Patient is sleeping at the foot of stretcher with her legs for long over the railing. Patient does not answer questions.   HENT:  Head: Normocephalic and atraumatic.  Right Ear: External ear normal.  Left Ear: External ear normal.  Nose: Nose normal. No mucosal edema or rhinorrhea.  Mouth/Throat: Oropharynx is clear and moist and mucous membranes are normal. No dental abscesses or uvula swelling.  Eyes: Conjunctivae and EOM are normal. Pupils are equal, round, and reactive to light.  Pupils are dilated but reactive to light  Neck: Normal range of motion and full passive range of motion without pain. Neck supple.  Cardiovascular: Normal rate, regular rhythm and normal heart sounds.  Exam reveals no gallop and no friction rub.   No murmur heard. Pulmonary/Chest: Effort normal and breath sounds normal. No respiratory distress. She has no wheezes. She has no rhonchi. She has no rales. She exhibits no tenderness and no crepitus.  Abdominal: Soft. Normal appearance and bowel sounds are normal. She exhibits no distension. There is no tenderness. There is no rebound and no guarding.  Musculoskeletal: Normal range of motion. She exhibits no edema or tenderness.  Moves all extremities well.   Neurological: She has normal strength. No cranial nerve deficit.  Patient moves all extremities well  Skin: Skin is warm, dry and intact. No rash noted. No erythema. No pallor.  Psychiatric: Her speech is slurred. She is slowed.  Patient appears intoxicated, she has difficulty speaking and changing positions in the stretcher  Nursing note and vitals reviewed.   ED Course  Procedures (including critical care time)  Pt was left to sober up in the ED, she is too intoxicated to leave on her own.   Nursing staff did not tell me however 5:30 AM patient had ambulated to the nurse's desk saying she wanted to be discharged. She then was seen walking out of the emergency  department at 5:55 AM.   Labs Review Results for orders placed or performed during the hospital encounter of 09/09/15  Comprehensive metabolic panel  Result Value Ref Range   Sodium 138 135 - 145 mmol/L   Potassium 3.0 (L) 3.5 - 5.1 mmol/L   Chloride 101 101 - 111 mmol/L   CO2 20 (L) 22 - 32 mmol/L   Glucose, Bld 104 (H) 65 - 99 mg/dL   BUN 9 6 - 20 mg/dL   Creatinine, Ser 0.88 0.44 - 1.00 mg/dL   Calcium 8.2 (L) 8.9 - 10.3 mg/dL   Total Protein 7.4 6.5 - 8.1 g/dL   Albumin 4.0 3.5 - 5.0 g/dL   AST 154 (H) 15 -  41 U/L   ALT 148 (H) 14 - 54 U/L   Alkaline Phosphatase 140 (H) 38 - 126 U/L   Total Bilirubin 0.8 0.3 - 1.2 mg/dL   GFR calc non Af Amer >60 >60 mL/min   GFR calc Af Amer >60 >60 mL/min   Anion gap 17 (H) 5 - 15  Ethanol (ETOH)  Result Value Ref Range   Alcohol, Ethyl (B) 464 (HH) <5 mg/dL  CBC  Result Value Ref Range   WBC 7.9 4.0 - 10.5 K/uL   RBC 4.16 3.87 - 5.11 MIL/uL   Hemoglobin 12.7 12.0 - 15.0 g/dL   HCT 38.4 36.0 - 46.0 %   MCV 92.3 78.0 - 100.0 fL   MCH 30.5 26.0 - 34.0 pg   MCHC 33.1 30.0 - 36.0 g/dL   RDW 17.4 (H) 11.5 - 15.5 %   Platelets 379 150 - 400 K/uL   Laboratory interpretation all normal except hypokalemia, elevation of LFTs consistent with alcoholism, acute alcohol intoxication     MDM   Final diagnoses:  Alcohol intoxication, uncomplicated ([place])    Pt left AMA   Rolland Porter, MD, Abram Sander     Rolland Porter, MD 09/09/15 [phone]

## 2015-09-09 NOTE — ED Notes (Signed)
PT sleeping at this time with no complaints. I called the lab at this time and requested results for Strep Culture.

## 2015-09-09 NOTE — ED Notes (Signed)
CRITICAL VALUE ALERT  Critical value received:  etoh 464  Date of notification:  09/09/15  Time of notification:  0220  Critical value read back:Yes.    Nurse who received alert:  Carolan Shiver  MD notified (1st page):  ITomi Bamberger  Time of first page:  0220  MD notified (2nd page):  Time of second page:  Responding MD:  Eliane Decree  Time MD responded:  [phone]

## 2015-09-09 NOTE — ED Notes (Signed)
Critical vaqlue of Alcohol 426 given to Dr. Thurnell Garbe.

## 2015-09-09 NOTE — ED Notes (Signed)
Pt has stated she wants to sign out AMA several times. She has come out of room to nurses desk to make this statement then back to her room. Each time pt goes back to room and back to bed, when rounding on pt she is sleeping.

## 2015-09-09 NOTE — ED Notes (Signed)
Pt states she needs to get detox from alcohol, states she wants to go to [facility] for detox in [place] but states she needs to have blood and urine tests in order to be able to go there.
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
## 2015-09-09 NOTE — ED Provider Notes (Signed)
CSN: [id number]     Arrival date & time 09/09/15  1507 History   First MD Initiated Contact with Patient 09/09/15 1528     Chief Complaint  Patient presents with  . Alcohol Intoxication  . Sore Throat     Patient is a 49 y.o. female presenting with intoxication and pharyngitis. The history is provided by the patient. The history is limited by the condition of the patient (intoxicated).  Alcohol Intoxication  Sore Throat  Pt was seen at 1530. Per pt, c/o gradual onset and persistence of constant etoh abuse since she left the ED at 0550 this morning for the same complaint. Pt states she has been "drinking Listerine all day" because she "got the shakes after leaving." Pt has hx of heavy etoh use "for years" and "wants to go to detox" because she "gets the shakes on my own."  Pt states she also "wants to be checked for strep" because her daughter was recently dx with strep throat. Denies fevers, no CP/SOB, no cough, no abd pain, no N/V/D. Denies SI/HI.     Past Medical History  Diagnosis Date  . Depression   . Peptic ulcer   . Alcohol abuse   . Anemia   . Benzodiazepine abuse   . Thyroid disease   . Alcoholism ([place])   . Narcotic abuse   . Back pain   . Thrombocytopenia ([place]) 06/17/2011  . Hypothyroidism   . Seizures ([place])   . Hypoglycemia   . Suicide attempt [facility])     multiple times   Past Surgical History  Procedure Laterality Date  . Cholecystectomy    . Abdominal surgery    . Esophagogastroduodenoscopy    . Gastric bypass    . Tubal ligation    . Esophagogastroduodenoscopy N/A 03/23/2013    Procedure: ESOPHAGOGASTRODUODENOSCOPY (EGD);  Surgeon: Ladene Artist, MD;  Location: [facility];  Service: Endoscopy;  Laterality: N/A;   Family History  Problem Relation Age of Onset  . Alcohol abuse Father   . Alcoholism Father   . Cancer Other    Social History  Substance Use Topics  . Smoking status: Current Every Day Smoker    Types: Cigarettes  . Smokeless tobacco:  None  . Alcohol Use: Yes     Comment: drink 4 liters a day. 1 case/day drinks Listerine/ 21 days sober today 03/12/15, per pt   OB History    Gravida Para Term Preterm AB TAB SAB Ectopic Multiple Living   3 3 0 3 0 0 0 0       Review of Systems  Unable to perform ROS: Other      Allergies  Nsaids  Home Medications   Prior to Admission medications   Medication Sig Start Date End Date Taking? Authorizing Provider  amitriptyline (ELAVIL) 100 MG tablet Take 1 tablet (100 mg total) by mouth at bedtime. 02/23/15   [name], NP  cephALEXin (KEFLEX) 500 MG capsule Take 1 capsule (500 mg total) by mouth 4 (four) times daily. 08/05/15   Ezequiel Essex, MD  clonazePAM (KLONOPIN) 1 MG tablet Take 1 mg by mouth 2 (two) times daily.    Historical Provider, MD  FLUoxetine (PROZAC) 20 MG capsule Take 3 capsules (60 mg total) by mouth daily. 02/23/15   [name], NP  gabapentin (NEURONTIN) 400 MG capsule Take 1 capsule (400 mg total) by mouth 3 (three) times daily. 08/05/15   Ezequiel Essex, MD  levothyroxine (SYNTHROID, LEVOTHROID) 150 MCG tablet Take  1 tablet (150 mcg total) by mouth daily before breakfast. 02/23/15   [name], NP  lithium carbonate 150 MG capsule Take 1 capsule (150 mg total) by mouth 2 (two) times daily with a meal. 02/23/15   [name], NP  Multiple Vitamin (MULTIVITAMIN WITH MINERALS) TABS tablet Take 1 tablet by mouth daily. Patient not taking: Reported on 03/12/2015 02/23/15   [name], NP  omeprazole (PRILOSEC) 40 MG capsule Take 1 capsule (40 mg total) by mouth daily. 02/23/15   [name], NP  traMADol (ULTRAM) 50 MG tablet Take 1 tablet (50 mg total) by mouth every 6 (six) hours as needed. Patient taking differently: Take 50 mg by mouth 2 (two) times daily.  07/07/15   [name], PA-C   BP 129/70 mmHg  Pulse 16  Temp(Src) 98.2 F (36.8 C) (Oral)  Resp 16  Ht 5\' 9"  (1.753 m)  Wt 220 lb (99.791 kg)  BMI 32.47 kg/m2  SpO2 99%  LMP  08/10/2015   Filed Vitals:   09/09/15 1511 09/09/15 1550 09/09/15 2139  BP: 129/70 129/70 114/82  Pulse: 16 16 116  Temp: 98.2 F (36.8 C)    TempSrc: Oral    Resp: 16  20  Height: 5\' 9"  (1.753 m)    Weight: 220 lb (99.791 kg)    SpO2: 99%  100%    Physical Exam  1535: Physical examination:  Nursing notes reviewed; Vital signs and O2 SAT reviewed;  Constitutional: Well developed, Well nourished, Well hydrated, Tearful and agitated at times.; Head:  Normocephalic, atraumatic; Eyes: EOMI, PERRL, No scleral icterus; ENMT: Mouth and pharynx normal, Mucous membranes moist. Mouth and pharynx without lesions. No tonsillar exudates. No intra-oral edema. No submandibular or sublingual edema. No hoarse voice, no drooling, no stridor. No trismus.; Neck: Supple, Full range of motion, No lymphadenopathy; Cardiovascular: Tachycardic rate and rhythm, No gallop; Respiratory: Breath sounds clear & equal bilaterally, No wheezes.  Speaking full sentences with ease, Normal respiratory effort/excursion; Chest: Nontender, Movement normal; Abdomen: Soft, Nontender, Nondistended, Normal bowel sounds; Genitourinary: No CVA tenderness; Extremities: Pulses normal, No tenderness, No edema, No calf edema or asymmetry.; Neuro: AA&Ox3, Major CN grossly intact.  Speech clear. No gross focal motor deficits in extremities.; Skin: Color normal, Warm, Dry.; Psych:  Intoxicated, tearful and agitated at times.    ED Course  Procedures (including critical care time) Labs Review   Imaging Review  I have personally reviewed and evaluated these images and lab results as part of my medical decision-making.   EKG Interpretation None      MDM  MDM Reviewed: previous chart, nursing note and vitals Reviewed previous: labs Interpretation: labs      Results for orders placed or performed during the hospital encounter of 09/09/15  Rapid strep screen  Result Value Ref Range   Streptococcus, Group A Screen (Direct)  NEGATIVE NEGATIVE  Comprehensive metabolic panel  Result Value Ref Range   Sodium 139 135 - 145 mmol/L   Potassium 3.3 (L) 3.5 - 5.1 mmol/L   Chloride 99 (L) 101 - 111 mmol/L   CO2 19 (L) 22 - 32 mmol/L   Glucose, Bld 78 65 - 99 mg/dL   BUN 9 6 - 20 mg/dL   Creatinine, Ser 0.92 0.44 - 1.00 mg/dL   Calcium 8.3 (L) 8.9 - 10.3 mg/dL   Total Protein 7.5 6.5 - 8.1 g/dL   Albumin 4.3 3.5 - 5.0 g/dL   AST 267 (H) 15 - 41 U/L  ALT 180 (H) 14 - 54 U/L   Alkaline Phosphatase 141 (H) 38 - 126 U/L   Total Bilirubin 0.6 0.3 - 1.2 mg/dL   GFR calc non Af Amer >60 >60 mL/min   GFR calc Af Amer >60 >60 mL/min   Anion gap 21 (H) 5 - 15  Acetaminophen level  Result Value Ref Range   Acetaminophen (Tylenol), Serum <10 (L) 10 - 30 ug/mL  Ethanol  Result Value Ref Range   Alcohol, Ethyl (B) 426 (HH) <5 mg/dL  Salicylate level  Result Value Ref Range   Salicylate Lvl 123456 2.8 - 30.0 mg/dL  CBC with Differential  Result Value Ref Range   WBC 7.8 4.0 - 10.5 K/uL   RBC 4.18 3.87 - 5.11 MIL/uL   Hemoglobin 12.8 12.0 - 15.0 g/dL   HCT 39.2 36.0 - 46.0 %   MCV 93.8 78.0 - 100.0 fL   MCH 30.6 26.0 - 34.0 pg   MCHC 32.7 30.0 - 36.0 g/dL   RDW 17.5 (H) 11.5 - 15.5 %   Platelets 404 (H) 150 - 400 K/uL   Neutrophils Relative % 71 %   Neutro Abs 5.6 1.7 - 7.7 K/uL   Lymphocytes Relative 14 %   Lymphs Abs 1.1 0.7 - 4.0 K/uL   Monocytes Relative 9 %   Monocytes Absolute 0.7 0.1 - 1.0 K/uL   Eosinophils Relative 4 %   Eosinophils Absolute 0.3 0.0 - 0.7 K/uL   Basophils Relative 1 %   Basophils Absolute 0.1 0.0 - 0.1 K/uL  Protime-INR  Result Value Ref Range   Prothrombin Time 14.2 11.6 - 15.2 seconds   INR 1.08 0.00 - 1.49  Urinalysis, Routine w reflex microscopic  Result Value Ref Range   Color, Urine ORANGE (A) YELLOW   APPearance CLEAR CLEAR   Specific Gravity, Urine 1.010 1.005 - 1.030   pH 5.5 5.0 - 8.0   Glucose, UA 100 (A) NEGATIVE mg/dL   Hgb urine dipstick SMALL (A) NEGATIVE    Bilirubin Urine NEGATIVE NEGATIVE   Ketones, ur 15 (A) NEGATIVE mg/dL   Protein, ur 100 (A) NEGATIVE mg/dL   Nitrite POSITIVE (A) NEGATIVE   Leukocytes, UA NEGATIVE NEGATIVE  Pregnancy, urine  Result Value Ref Range   Preg Test, Ur NEGATIVE NEGATIVE  Urine rapid drug screen (hosp performed)  Result Value Ref Range   Opiates NONE DETECTED NONE DETECTED   Cocaine NONE DETECTED NONE DETECTED   Benzodiazepines POSITIVE (A) NONE DETECTED   Amphetamines NONE DETECTED NONE DETECTED   Tetrahydrocannabinol NONE DETECTED NONE DETECTED   Barbiturates NONE DETECTED NONE DETECTED  Urine microscopic-add on  Result Value Ref Range   Squamous Epithelial / LPF 6-30 (A) NONE SEEN   WBC, UA 0-5 0 - 5 WBC/hpf   RBC / HPF 0-5 0 - 5 RBC/hpf   Bacteria, UA FEW (A) NONE SEEN   Casts GRANULAR CAST (A) NEGATIVE    2145:  LFT's elevating over several past ED visits; likely due to her heavy etoh use. No clear UTI on Udip. Pt has slept most of her ED visit. Has taken PO well without N/V. Pt will need re-eval when clinically sober regarding any SI (denied on arrival) and/or wanting detox (has requested the past 2 ED visits). CIWA protocol ordered. Sign out to next shift.       Francine Graven, DO 09/09/15 2150

## 2015-09-09 NOTE — ED Notes (Signed)
PT sleeping at this time

## 2015-09-09 NOTE — ED Notes (Signed)
Pt wants detox as well

## 2015-09-09 NOTE — ED Provider Notes (Signed)
9:46 PM Assumed care from Dr. Elise Benne, please see their note for full history, physical and decision making until this point. In brief this is a 49 y.o. year old female who presented to the ED tonight with Alcohol Intoxication and Sore Throat     Awaiting for her to metabolize to clinical sobriety and clinical reevaluate.   patietn clinically sober, ambulating around ED with steady gait, tolerating PO, requesting detox. No SI/HI/AVH. Discussed detox options with her and she has one lined up in [place] already. Will do librium taper over next few days and prn ativan as well. Lives with sober roommates who will help her. Also w/ symptoms of UTI and h/o same, requesting treatment, fosfomycin ordered. Requesting HIV/RPR check.   Discharge instructions, including strict return precautions for new or worsening symptoms, given. Patient and/or family verbalized understanding and agreement with the plan as described.   Labs, studies and imaging reviewed by myself and considered in medical decision making if ordered. Imaging interpreted by radiology.  Labs Reviewed  COMPREHENSIVE METABOLIC PANEL - Abnormal; Notable for the following:    Potassium 3.3 (*)    Chloride 99 (*)    CO2 19 (*)    Calcium 8.3 (*)    AST 267 (*)    ALT 180 (*)    Alkaline Phosphatase 141 (*)    Anion gap 21 (*)    All other components within normal limits  ACETAMINOPHEN LEVEL - Abnormal; Notable for the following:    Acetaminophen (Tylenol), Serum <10 (*)    All other components within normal limits  ETHANOL - Abnormal; Notable for the following:    Alcohol, Ethyl (B) 426 (*)    All other components within normal limits  CBC WITH DIFFERENTIAL/PLATELET - Abnormal; Notable for the following:    RDW 17.5 (*)    Platelets 404 (*)    All other components within normal limits  URINALYSIS, ROUTINE W REFLEX MICROSCOPIC (NOT AT [facility]) - Abnormal; Notable for the following:    Color, Urine ORANGE (*)    Glucose, UA 100 (*)    Hgb  urine dipstick SMALL (*)    Ketones, ur 15 (*)    Protein, ur 100 (*)    Nitrite POSITIVE (*)    All other components within normal limits  URINE RAPID DRUG SCREEN, HOSP PERFORMED - Abnormal; Notable for the following:    Benzodiazepines POSITIVE (*)    All other components within normal limits  URINE MICROSCOPIC-ADD ON - Abnormal; Notable for the following:    Squamous Epithelial / LPF 6-30 (*)    Bacteria, UA FEW (*)    Casts GRANULAR CAST (*)    All other components within normal limits  RAPID STREP SCREEN (NOT AT [facility])  CULTURE, GROUP A STREP  SALICYLATE LEVEL  PROTIME-INR  PREGNANCY, URINE    No orders to display    No Follow-up on file.   Merrily Pew, MD 09/10/15 [phone]

## 2015-09-09 NOTE — ED Notes (Signed)
Pt states understanding of care given and follow up instructions.  Ambulated to waiting room to wait for ride

## 2015-09-09 NOTE — ED Notes (Signed)
Pt walked out of room Alert oriented and ambulatory, stated she was leaving AMA. Pt was coherent and steady. Refused further medical attention.

## 2015-09-10 LAB — RAPID HIV SCREEN (HIV 1/2 AB+AG)
HIV 1/2 ANTIBODIES: NONREACTIVE
HIV-1 P24 Antigen - HIV24: NONREACTIVE

## 2015-09-11 LAB — RPR: RPR Ser Ql: NONREACTIVE

## 2015-09-11 LAB — HIV ANTIBODY (ROUTINE TESTING W REFLEX): HIV Screen 4th Generation wRfx: NONREACTIVE

## 2015-09-11 LAB — CULTURE, GROUP A STREP: STREP A CULTURE: NEGATIVE

## 2015-10-03 ENCOUNTER — Emergency Department (HOSPITAL_COMMUNITY)
Admission: EM | Admit: 2015-10-03 | Discharge: 2015-10-04 | Disposition: A | Discharge status: Other Acute Inpatient Hospital | Payer: Self-pay | Attending: Emergency Medicine | Admitting: Emergency Medicine

## 2015-10-03 ENCOUNTER — Encounter (HOSPITAL_COMMUNITY): Payer: Self-pay | Admitting: *Deleted

## 2015-10-03 DIAGNOSIS — Z79899 Other long term (current) drug therapy: Secondary | ICD-10-CM | POA: Insufficient documentation

## 2015-10-03 DIAGNOSIS — Z3202 Encounter for pregnancy test, result negative: Secondary | ICD-10-CM | POA: Insufficient documentation

## 2015-10-03 DIAGNOSIS — E039 Hypothyroidism, unspecified: Secondary | ICD-10-CM | POA: Insufficient documentation

## 2015-10-03 DIAGNOSIS — Z8711 Personal history of peptic ulcer disease: Secondary | ICD-10-CM | POA: Insufficient documentation

## 2015-10-03 DIAGNOSIS — F329 Major depressive disorder, single episode, unspecified: Secondary | ICD-10-CM | POA: Insufficient documentation

## 2015-10-03 DIAGNOSIS — F10929 Alcohol use, unspecified with intoxication, unspecified: Secondary | ICD-10-CM

## 2015-10-03 DIAGNOSIS — F10120 Alcohol abuse with intoxication, uncomplicated: Secondary | ICD-10-CM | POA: Insufficient documentation

## 2015-10-03 DIAGNOSIS — F17210 Nicotine dependence, cigarettes, uncomplicated: Secondary | ICD-10-CM | POA: Insufficient documentation

## 2015-10-03 DIAGNOSIS — Z792 Long term (current) use of antibiotics: Secondary | ICD-10-CM | POA: Insufficient documentation

## 2015-10-03 DIAGNOSIS — Z862 Personal history of diseases of the blood and blood-forming organs and certain disorders involving the immune mechanism: Secondary | ICD-10-CM | POA: Insufficient documentation

## 2015-10-03 DIAGNOSIS — R45851 Suicidal ideations: Secondary | ICD-10-CM | POA: Insufficient documentation

## 2015-10-03 DIAGNOSIS — F1310 Sedative, hypnotic or anxiolytic abuse, uncomplicated: Secondary | ICD-10-CM | POA: Insufficient documentation

## 2015-10-03 DIAGNOSIS — F1110 Opioid abuse, uncomplicated: Secondary | ICD-10-CM | POA: Insufficient documentation

## 2015-10-03 LAB — COMPREHENSIVE METABOLIC PANEL
ALK PHOS: 112 U/L (ref 38–126)
ALT: 229 U/L — AB (ref 14–54)
AST: 191 U/L — AB (ref 15–41)
Albumin: 4.2 g/dL (ref 3.5–5.0)
Anion gap: 14 (ref 5–15)
BILIRUBIN TOTAL: 0.3 mg/dL (ref 0.3–1.2)
BUN: 13 mg/dL (ref 6–20)
CO2: 21 mmol/L — ABNORMAL LOW (ref 22–32)
CREATININE: 0.83 mg/dL (ref 0.44–1.00)
Calcium: 8 mg/dL — ABNORMAL LOW (ref 8.9–10.3)
Chloride: 104 mmol/L (ref 101–111)
GFR calc Af Amer: >60 mL/min (ref >60)
Glucose, Bld: 100 mg/dL — ABNORMAL HIGH (ref 65–99)
Potassium: 3.6 mmol/L (ref 3.5–5.1)
Sodium: 139 mmol/L (ref 135–145)
TOTAL PROTEIN: 7.5 g/dL (ref 6.5–8.1)

## 2015-10-03 LAB — CBC WITH DIFFERENTIAL/PLATELET
BASOS ABS: 0.1 10*3/uL (ref 0.0–0.1)
Basophils Relative: 1 %
EOS ABS: 0.5 10*3/uL (ref 0.0–0.7)
EOS PCT: 5 %
HCT: 37.8 % (ref 36.0–46.0)
Hemoglobin: 12.1 g/dL (ref 12.0–15.0)
LYMPHS ABS: 1.5 10*3/uL (ref 0.7–4.0)
Lymphocytes Relative: 18 %
MCH: 30.6 pg (ref 26.0–34.0)
MCHC: 32 g/dL (ref 30.0–36.0)
MCV: 95.7 fL (ref 78.0–100.0)
Monocytes Absolute: 0.8 10*3/uL (ref 0.1–1.0)
Monocytes Relative: 9 %
Neutro Abs: 5.9 10*3/uL (ref 1.7–7.7)
Neutrophils Relative %: 67 %
PLATELETS: 449 10*3/uL — AB (ref 150–400)
RBC: 3.95 MIL/uL (ref 3.87–5.11)
RDW: 18.3 % — AB (ref 11.5–15.5)
WBC: 8.7 10*3/uL (ref 4.0–10.5)

## 2015-10-03 LAB — RAPID URINE DRUG SCREEN, HOSP PERFORMED
Amphetamines: NOT DETECTED
Barbiturates: NOT DETECTED
Benzodiazepines: POSITIVE — AB
Cocaine: NOT DETECTED
Opiates: POSITIVE — AB
Tetrahydrocannabinol: NOT DETECTED

## 2015-10-03 LAB — ETHANOL: ALCOHOL ETHYL (B): 391 mg/dL — AB (ref <5)

## 2015-10-03 LAB — I-STAT BETA HCG BLOOD, ED (MC, WL, AP ONLY): I-stat hCG, quantitative: <5 m[IU]/mL (ref <5)

## 2015-10-03 LAB — LITHIUM LEVEL: Lithium Lvl: <0.06 mmol/L — ABNORMAL LOW (ref 0.60–1.20)

## 2015-10-03 MED ORDER — LORAZEPAM 1 MG PO TABS
0.0000 mg | ORAL_TABLET | Freq: Four times a day (QID) | ORAL | Status: DC
  Administered 2015-10-04 (×2): 1 mg via ORAL
  Administered 2015-10-04: 2 mg via ORAL
  Filled 2015-10-03: qty 2
  Filled 2015-10-03: qty 1
  Filled 2015-10-03: qty 2

## 2015-10-03 MED ORDER — PANTOPRAZOLE SODIUM 40 MG PO TBEC
40.0000 mg | DELAYED_RELEASE_TABLET | Freq: Every day | ORAL | Status: DC
  Administered 2015-10-03 – 2015-10-04 (×2): 40 mg via ORAL
  Filled 2015-10-03 (×2): qty 1

## 2015-10-03 MED ORDER — LORAZEPAM 2 MG/ML IJ SOLN: 0.0000 mg | Freq: Two times a day (BID) | INTRAMUSCULAR | Status: DC

## 2015-10-03 MED ORDER — ONDANSETRON HCL 4 MG PO TABS: 4.0000 mg | ORAL_TABLET | Freq: Three times a day (TID) | ORAL | Status: DC | PRN

## 2015-10-03 MED ORDER — DIPHENHYDRAMINE HCL 25 MG PO CAPS
25.0000 mg | ORAL_CAPSULE | Freq: Once | ORAL | Status: AC
  Administered 2015-10-03: 25 mg via ORAL
  Filled 2015-10-03: qty 1

## 2015-10-03 MED ORDER — LEVOTHYROXINE SODIUM 50 MCG PO TABS
150.0000 ug | ORAL_TABLET | Freq: Every day | ORAL | Status: DC
  Administered 2015-10-04: 150 ug via ORAL
  Filled 2015-10-03: qty 3

## 2015-10-03 MED ORDER — FLUOXETINE HCL 20 MG PO CAPS
60.0000 mg | ORAL_CAPSULE | Freq: Every day | ORAL | Status: DC
  Administered 2015-10-04: 60 mg via ORAL
  Filled 2015-10-03 (×2): qty 3

## 2015-10-03 MED ORDER — VITAMIN B-1 100 MG PO TABS
100.0000 mg | ORAL_TABLET | Freq: Every day | ORAL | Status: DC
  Administered 2015-10-04: 100 mg via ORAL
  Filled 2015-10-03: qty 1

## 2015-10-03 MED ORDER — THIAMINE HCL 100 MG/ML IJ SOLN: 100.0000 mg | Freq: Every day | INTRAMUSCULAR | Status: DC

## 2015-10-03 MED ORDER — LORAZEPAM 2 MG/ML IJ SOLN: 0.0000 mg | Freq: Four times a day (QID) | INTRAMUSCULAR | Status: DC

## 2015-10-03 MED ORDER — LORAZEPAM 1 MG PO TABS
0.0000 mg | ORAL_TABLET | Freq: Two times a day (BID) | ORAL | Status: DC
  Administered 2015-10-03: 2 mg via ORAL
  Filled 2015-10-03: qty 2

## 2015-10-03 MED ORDER — NICOTINE 21 MG/24HR TD PT24
21.0000 mg | MEDICATED_PATCH | Freq: Every day | TRANSDERMAL | Status: DC
  Administered 2015-10-03: 21 mg via TRANSDERMAL
  Filled 2015-10-03: qty 1

## 2015-10-03 NOTE — ED Notes (Signed)
Pt given meal tray at this time 

## 2015-10-03 NOTE — ED Notes (Signed)
Pt valuables include gold colored necklace and a watch. Put with belongings.

## 2015-10-03 NOTE — ED Provider Notes (Signed)
CSN: [id number]     Arrival date & time 10/03/15  1648 History   First MD Initiated Contact with Patient 10/03/15 1733     Chief Complaint  Patient presents with  . Alcohol Intoxication   LEVEL 5 CAVEAT DUE TO INTOXICATION   Patient is a 49 y.o. female presenting with intoxication. The history is provided by the patient. The history is limited by the condition of the patient.  Alcohol Intoxication This is a new problem. Episode onset: UNKNOWN. The problem occurs constantly. The problem has been gradually worsening. Nothing aggravates the symptoms. Nothing relieves the symptoms.   Patient presents with alcohol intoxication Pt is an alcoholic and brought to the ED with her sponsor While awaiting evaluation, she reported she wanted to kill herself by walking in front of truck She reports drinking ETOH while in the waiting room  Past Medical History  Diagnosis Date  . Depression   . Peptic ulcer   . Alcohol abuse   . Anemia   . Benzodiazepine abuse   . Thyroid disease   . Alcoholism ([place])   . Narcotic abuse   . Back pain   . Thrombocytopenia ([place]) 06/17/2011  . Hypothyroidism   . Seizures ([place])   . Hypoglycemia   . Suicide attempt [facility])     multiple times   Past Surgical History  Procedure Laterality Date  . Cholecystectomy    . Abdominal surgery    . Esophagogastroduodenoscopy    . Gastric bypass    . Tubal ligation    . Esophagogastroduodenoscopy N/A 03/23/2013    Procedure: ESOPHAGOGASTRODUODENOSCOPY (EGD);  Surgeon: Ladene Artist, MD;  Location: [facility];  Service: Endoscopy;  Laterality: N/A;   Family History  Problem Relation Age of Onset  . Alcohol abuse Father   . Alcoholism Father   . Cancer Other    Social History  Substance Use Topics  . Smoking status: Current Every Day Smoker    Types: Cigarettes  . Smokeless tobacco: None  . Alcohol Use: Yes     Comment: drink 4 liters a day. 1 case/day drinks Listerine/ 21 days sober today 03/12/15, per pt   OB  History    Gravida Para Term Preterm AB TAB SAB Ectopic Multiple Living   3 3 0 3 0 0 0 0       Review of Systems  Unable to perform ROS: Psychiatric disorder  Psychiatric/Behavioral: Positive for suicidal ideas.      Allergies  Nsaids  Home Medications   Prior to Admission medications   Medication Sig Start Date End Date Taking? Authorizing Provider  amitriptyline (ELAVIL) 100 MG tablet Take 1 tablet (100 mg total) by mouth at bedtime. 02/23/15   [name], NP  cephALEXin (KEFLEX) 500 MG capsule Take 1 capsule (500 mg total) by mouth 4 (four) times daily. 08/05/15   Ezequiel Essex, MD  chlordiazePOXIDE (LIBRIUM) 25 MG capsule 50mg  PO TID x 1D, then 25-50mg  PO BID X 1D, then 25-50mg  PO QD X 1D 09/09/15   Merrily Pew, MD  clonazePAM (KLONOPIN) 1 MG tablet Take 1 mg by mouth 2 (two) times daily.    Historical Provider, MD  FLUoxetine (PROZAC) 20 MG capsule Take 3 capsules (60 mg total) by mouth daily. 02/23/15   [name], NP  gabapentin (NEURONTIN) 400 MG capsule Take 1 capsule (400 mg total) by mouth 3 (three) times daily. 08/05/15   Ezequiel Essex, MD  levothyroxine (SYNTHROID, LEVOTHROID) 150 MCG tablet Take 1 tablet (150  mcg total) by mouth daily before breakfast. 02/23/15   [name], NP  lithium carbonate 150 MG capsule Take 1 capsule (150 mg total) by mouth 2 (two) times daily with a meal. 02/23/15   [name], NP  LORazepam (ATIVAN) 1 MG tablet Take 1 tablet (1 mg total) by mouth every 8 (eight) hours as needed for anxiety. 09/09/15   Merrily Pew, MD  Multiple Vitamin (MULTIVITAMIN WITH MINERALS) TABS tablet Take 1 tablet by mouth daily. Patient not taking: Reported on 03/12/2015 02/23/15   [name], NP  omeprazole (PRILOSEC) 40 MG capsule Take 1 capsule (40 mg total) by mouth daily. 02/23/15   [name], NP  traMADol (ULTRAM) 50 MG tablet Take 1 tablet (50 mg total) by mouth every 6 (six) hours as needed. Patient taking differently: Take 50 mg by  mouth 2 (two) times daily.  07/07/15   [name], PA-C   BP 112/71 mmHg  Pulse 94  Temp(Src) 98.4 F (36.9 C) (Oral)  Resp 16  Ht 5\' 9"  (1.753 m)  Wt 108.863 kg  BMI 35.43 kg/m2  SpO2 98%  LMP 09/19/2015 Physical Exam CONSTITUTIONAL: disheveled, smells of listerine HEAD: Normocephalic/atraumatic EYES: EOMI/PERRL ENMT: Mucous membranes moist NECK: supple no meningeal signs SPINE/BACK:entire spine nontender CV: S1/S2 noted, no murmurs/rubs/gallops noted LUNGS: Lungs are clear to auscultation bilaterally, no apparent distress ABDOMEN: soft, nontender, no rebound or guarding, bowel sounds noted throughout abdomen NEURO: Pt is awake/alert, moves all extremitiesx4.   EXTREMITIES: pulses normal/equal, full ROM SKIN: warm, color normal PSYCH: unable to fully assess due to intoxication  ED Course  Procedures   6:25 PM Pt with obvious ETOH intoxication and now reporting suicidal ideations IVC completed and sent to magistrate Psych orders and CIWA protocol ordered 7:08 PM Labs resulted Pt intoxicated Will need psych consulted Pt otherwise medically stable  Labs Review Labs Reviewed  COMPREHENSIVE METABOLIC PANEL - Abnormal; Notable for the following:    CO2 21 (*)    Glucose, Bld 100 (*)    Calcium 8.0 (*)    AST 191 (*)    ALT 229 (*)    All other components within normal limits  CBC WITH DIFFERENTIAL/PLATELET - Abnormal; Notable for the following:    RDW 18.3 (*)    Platelets 449 (*)    All other components within normal limits  ETHANOL - Abnormal; Notable for the following:    Alcohol, Ethyl (B) 391 (*)    All other components within normal limits  URINE RAPID DRUG SCREEN, HOSP PERFORMED - Abnormal; Notable for the following:    Opiates POSITIVE (*)    Benzodiazepines POSITIVE (*)    All other components within normal limits  LITHIUM LEVEL - Abnormal; Notable for the following:    Lithium Lvl <0.06 (*)    All other components within normal limits  PREGNANCY,  URINE  I-STAT BETA HCG BLOOD, ED ([facility], [facility], [facility] ONLY)   I have personally reviewed and evaluated these  lab results as part of my medical decision-making.   Medications  nicotine (NICODERM CQ - dosed in mg/24 hours) patch 21 mg (not administered)  ondansetron (ZOFRAN) tablet 4 mg (not administered)  FLUoxetine (PROZAC) capsule 60 mg (not administered)  levothyroxine (SYNTHROID, LEVOTHROID) tablet 150 mcg (not administered)  pantoprazole (PROTONIX) EC tablet 40 mg (not administered)    MDM   Final diagnoses:  Alcohol intoxication, with unspecified complication ([place])  Suicidal ideation    Nursing notes including past medical history and  social history reviewed and considered in documentation Labs/vital reviewed myself and considered during evaluation     Ripley Fraise, MD 10/03/15 1909

## 2015-10-03 NOTE — ED Notes (Signed)
Charge RN made aware of pt complaint.

## 2015-10-03 NOTE — ED Notes (Addendum)
Sitter and RN assisted patient in restroom. Patient unsteady gait and agitated. Non-compliant with staff at times.

## 2015-10-03 NOTE — ED Notes (Signed)
TTS attempted at this time but pt is intoxicated and unable to keep attention long enough for eval per therapist.

## 2015-10-03 NOTE — ED Notes (Addendum)
Pt comes in for alcohol intoxication seeking to detox from this. Pt was dropped off by [place] sponsor. She verbalizes that her last drink was at 1700 in the bathroom of our waiting room. CIWA taken in triage, see chart.    Pt is verbalizing she wants to kill herself by walking out in front of a truck.

## 2015-10-03 NOTE — ED Notes (Signed)
CRITICAL VALUE ALERT  Critical value received:  Alcohol 391  Date of notification:  10/03/15  Time of notification:  1857  Critical value read back:yes  Nurse who received alert:  Rminter, Rn  MD notified (1st page):  Dr. Bayard Males  Time of first page:  1857  MD notified (2nd page):  Time of second page:  Responding MD:  Dr. Christy Gentles  Time MD responded:  [phone]

## 2015-10-03 NOTE — ED Notes (Signed)
Pt ambulated to bathroom with assistance at this time. Pt given water.

## 2015-10-03 NOTE — ED Notes (Signed)
Patient with large Listerine bottle in purse that she had been drinking. Small amount left in 2 liter bottle. Remainder of bottle poured down the sink.

## 2015-10-03 NOTE — ED Notes (Signed)
Security at bedside, pt placed in paper scrubs, wanded by security and belongings locked in locker. Pt states "i better get some ativan."

## 2015-10-03 NOTE — ED Notes (Signed)
Dr. Wickline at bedside.  

## 2015-10-04 ENCOUNTER — Encounter (HOSPITAL_COMMUNITY): Payer: Self-pay | Admitting: *Deleted

## 2015-10-04 ENCOUNTER — Inpatient Hospital Stay (HOSPITAL_COMMUNITY)
Admission: EM | Admit: 2015-10-04 | Discharge: 2015-10-07 | DRG: 897 | Disposition: A | Discharge status: Home / Self Care | Payer: No Typology Code available for payment source | Source: Intra-hospital | Attending: Psychiatry | Admitting: Psychiatry

## 2015-10-04 DIAGNOSIS — R45851 Suicidal ideations: Secondary | ICD-10-CM | POA: Diagnosis present

## 2015-10-04 DIAGNOSIS — F10230 Alcohol dependence with withdrawal, uncomplicated: Secondary | ICD-10-CM | POA: Diagnosis present

## 2015-10-04 DIAGNOSIS — F17210 Nicotine dependence, cigarettes, uncomplicated: Secondary | ICD-10-CM | POA: Diagnosis present

## 2015-10-04 DIAGNOSIS — Z9884 Bariatric surgery status: Secondary | ICD-10-CM | POA: Diagnosis not present

## 2015-10-04 DIAGNOSIS — F329 Major depressive disorder, single episode, unspecified: Secondary | ICD-10-CM | POA: Diagnosis present

## 2015-10-04 DIAGNOSIS — Z811 Family history of alcohol abuse and dependence: Secondary | ICD-10-CM

## 2015-10-04 DIAGNOSIS — Y908 Blood alcohol level of 240 mg/100 ml or more: Secondary | ICD-10-CM | POA: Diagnosis present

## 2015-10-04 DIAGNOSIS — F1020 Alcohol dependence, uncomplicated: Secondary | ICD-10-CM | POA: Diagnosis present

## 2015-10-04 DIAGNOSIS — F332 Major depressive disorder, recurrent severe without psychotic features: Secondary | ICD-10-CM | POA: Diagnosis not present

## 2015-10-04 LAB — ETHANOL: Alcohol, Ethyl (B): <5 mg/dL (ref <5)

## 2015-10-04 MED ORDER — LITHIUM CARBONATE 300 MG PO CAPS
300.0000 mg | ORAL_CAPSULE | Freq: Two times a day (BID) | ORAL | Status: DC
  Administered 2015-10-04: 300 mg via ORAL
  Filled 2015-10-04 (×3): qty 1

## 2015-10-04 MED ORDER — MAGNESIUM HYDROXIDE 400 MG/5ML PO SUSP
30.0000 mL | Freq: Every day | ORAL | Status: DC | PRN
  Administered 2015-10-06: 30 mL via ORAL
  Filled 2015-10-04: qty 30

## 2015-10-04 MED ORDER — LORAZEPAM 1 MG PO TABS
ORAL_TABLET | ORAL | Status: AC
  Administered 2015-10-04: 1 mg via ORAL
  Filled 2015-10-04: qty 1

## 2015-10-04 MED ORDER — LORAZEPAM 1 MG PO TABS: 1.0000 mg | ORAL_TABLET | Freq: Every day | ORAL | Status: DC

## 2015-10-04 MED ORDER — ADULT MULTIVITAMIN W/MINERALS CH
1.0000 | ORAL_TABLET | Freq: Every day | ORAL | Status: DC
  Filled 2015-10-04 (×7): qty 1

## 2015-10-04 MED ORDER — DIPHENHYDRAMINE HCL 25 MG PO CAPS
50.0000 mg | ORAL_CAPSULE | Freq: Once | ORAL | Status: AC
  Administered 2015-10-04: 50 mg via ORAL
  Filled 2015-10-04: qty 2

## 2015-10-04 MED ORDER — LORAZEPAM 1 MG PO TABS
1.0000 mg | ORAL_TABLET | Freq: Four times a day (QID) | ORAL | Status: AC | PRN
  Administered 2015-10-04: 1 mg via ORAL

## 2015-10-04 MED ORDER — THIAMINE HCL 100 MG/ML IJ SOLN
100.0000 mg | Freq: Once | INTRAMUSCULAR | Status: AC
  Administered 2015-10-04: 100 mg via INTRAMUSCULAR
  Filled 2015-10-04: qty 2

## 2015-10-04 MED ORDER — LORAZEPAM 1 MG PO TABS
1.0000 mg | ORAL_TABLET | Freq: Two times a day (BID) | ORAL | Status: DC
  Administered 2015-10-07: 1 mg via ORAL
  Filled 2015-10-04: qty 1

## 2015-10-04 MED ORDER — LORAZEPAM 1 MG PO TABS
1.0000 mg | ORAL_TABLET | Freq: Three times a day (TID) | ORAL | Status: AC
  Administered 2015-10-06 (×3): 1 mg via ORAL
  Filled 2015-10-04 (×3): qty 1

## 2015-10-04 MED ORDER — ACETAMINOPHEN 325 MG PO TABS
650.0000 mg | ORAL_TABLET | Freq: Four times a day (QID) | ORAL | Status: DC | PRN
  Administered 2015-10-05 – 2015-10-07 (×7): 650 mg via ORAL
  Filled 2015-10-04 (×8): qty 2

## 2015-10-04 MED ORDER — NICOTINE 21 MG/24HR TD PT24
21.0000 mg | MEDICATED_PATCH | Freq: Every day | TRANSDERMAL | Status: DC
  Administered 2015-10-04 – 2015-10-07 (×4): 21 mg via TRANSDERMAL
  Filled 2015-10-04 (×6): qty 1

## 2015-10-04 MED ORDER — ALUM & MAG HYDROXIDE-SIMETH 200-200-20 MG/5ML PO SUSP: 30.0000 mL | ORAL | Status: DC | PRN

## 2015-10-04 MED ORDER — HYDROXYZINE HCL 25 MG PO TABS
25.0000 mg | ORAL_TABLET | Freq: Four times a day (QID) | ORAL | Status: AC | PRN
  Administered 2015-10-05: 25 mg via ORAL
  Filled 2015-10-04 (×2): qty 1

## 2015-10-04 MED ORDER — LORAZEPAM 1 MG PO TABS
1.0000 mg | ORAL_TABLET | Freq: Four times a day (QID) | ORAL | Status: AC
Start: 2015-10-04 — End: 2015-10-05
  Administered 2015-10-04 – 2015-10-05 (×6): 1 mg via ORAL
  Filled 2015-10-04 (×6): qty 1

## 2015-10-04 MED ORDER — ONDANSETRON 4 MG PO TBDP
4.0000 mg | ORAL_TABLET | Freq: Four times a day (QID) | ORAL | Status: AC | PRN
Start: 2015-10-04 — End: 2015-10-07

## 2015-10-04 MED ORDER — TRAZODONE HCL 50 MG PO TABS
50.0000 mg | ORAL_TABLET | Freq: Every day | ORAL | Status: DC
  Administered 2015-10-04: 50 mg via ORAL
  Filled 2015-10-04: qty 1
  Filled 2015-10-04: qty 14
  Filled 2015-10-04 (×4): qty 1

## 2015-10-04 MED ORDER — LOPERAMIDE HCL 2 MG PO CAPS: 2.0000 mg | ORAL_CAPSULE | ORAL | Status: AC | PRN

## 2015-10-04 MED ORDER — VITAMIN B-1 100 MG PO TABS
100.0000 mg | ORAL_TABLET | Freq: Every day | ORAL | Status: DC
  Administered 2015-10-05 – 2015-10-07 (×3): 100 mg via ORAL
  Filled 2015-10-04 (×5): qty 1

## 2015-10-04 NOTE — ED Notes (Signed)
MD notified of CIWA score of 14.

## 2015-10-04 NOTE — ED Notes (Signed)
TTS consult in progress at this time.

## 2015-10-04 NOTE — ED Notes (Signed)
Spoke with Santiago Glad at [facility].  Unable to take pt with a CIWA of 21.  Wants pt to be medicated and a CIWA rechecked.

## 2015-10-04 NOTE — ED Notes (Signed)
Pt ambulating with assistance of sitter to bathroom. Pt is given a drink at this time.

## 2015-10-04 NOTE — ED Notes (Signed)
Pt ambulating to bathroom with assistance of sitter at this time.
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
## 2015-10-04 NOTE — Tx Team (Signed)
Initial Interdisciplinary Treatment Plan   PATIENT STRESSORS: Marital or family conflict ETOH abuse   PATIENT STRENGTHS: none noted   PROBLEM LIST: Problem List/Patient Goals Date to be addressed Date deferred Reason deferred Estimated date of resolution  Substance abuse 10/04/15   DC  depression                                                 DISCHARGE CRITERIA:  Adequate post-discharge living arrangements Improved stabilization in mood, thinking, and/or behavior Withdrawal symptoms are absent or subacute and managed without 24-hour nursing intervention  PRELIMINARY DISCHARGE PLAN: Attend aftercare/continuing care group Attend 12-step recovery group Outpatient therapy Placement in alternative living arrangements  PATIENT/FAMIILY INVOLVEMENT: This treatment plan has been presented to and reviewed with the patient, Phillis Haggis, and/or family member, *pt.  The patient and family have been given the opportunity to ask questions and make suggestions.  Oretha Milch 10/04/2015, 3:30 PM

## 2015-10-04 NOTE — Progress Notes (Signed)
Patient did not attend AA group. 

## 2015-10-04 NOTE — BHH Counselor (Addendum)
Per [facility], Inocencio Homes, pt is accepted to [facility], Room 303-2, Attending Dr. Sabra Heck. Pt can come after 8 AM.   Faylene Kurtz, MS, CRC, [facility] Triage Specialist [facility]

## 2015-10-04 NOTE — Progress Notes (Signed)
Skin assessment:  Pt had multiple minor, superficial cuts and abrasions, including a cut to right forearm, abrasions to left heel, right knee abrasion with bruising present, abrasions to left elbow, cuts and abrasions to chin and upper lip. She also had an abdominal scar that she reports is from gastric bypass surgery. Pt reported right knee/leg pain and was limping slightly.

## 2015-10-04 NOTE — ED Provider Notes (Signed)
Patient accepted to [facility] by Dr. Sabra Heck. Vitals stable.  Mild tremors No evidence of life threatening withdrawal.   BP 119/59 mmHg  Pulse 97  Temp(Src) 97.9 F (36.6 C) (Oral)  Resp 14  Ht 5\' 9"  (1.753 m)  Wt 240 lb (108.863 kg)  BMI 35.43 kg/m2  SpO2 95%  LMP 09/19/2015   Ezequiel Essex, MD 10/04/15 [phone]
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
## 2015-10-05 ENCOUNTER — Encounter (HOSPITAL_COMMUNITY): Payer: Self-pay | Admitting: Psychiatry

## 2015-10-05 DIAGNOSIS — F332 Major depressive disorder, recurrent severe without psychotic features: Secondary | ICD-10-CM

## 2015-10-05 DIAGNOSIS — F10230 Alcohol dependence with withdrawal, uncomplicated: Principal | ICD-10-CM

## 2015-10-05 LAB — TSH: TSH: 6.319 u[IU]/mL — AB (ref 0.350–4.500)

## 2015-10-05 LAB — LIPID PANEL
CHOL/HDL RATIO: 2.5 ratio
Cholesterol: 210 mg/dL — ABNORMAL HIGH (ref 0–200)
HDL: 83 mg/dL (ref >40)
LDL CALC: 85 mg/dL (ref 0–99)
Triglycerides: 208 mg/dL — ABNORMAL HIGH (ref <150)
VLDL: 42 mg/dL — AB (ref 0–40)

## 2015-10-05 MED ORDER — SERTRALINE HCL 25 MG PO TABS: 25.0000 mg | ORAL_TABLET | Freq: Every day | ORAL | Status: DC

## 2015-10-05 MED ORDER — LEVOTHYROXINE SODIUM 150 MCG PO TABS
150.0000 ug | ORAL_TABLET | Freq: Every day | ORAL | Status: DC
  Administered 2015-10-05 – 2015-10-07 (×3): 150 ug via ORAL
  Filled 2015-10-05: qty 1
  Filled 2015-10-05: qty 14
  Filled 2015-10-05 (×3): qty 1

## 2015-10-05 MED ORDER — DIPHENHYDRAMINE HCL 25 MG PO CAPS
50.0000 mg | ORAL_CAPSULE | ORAL | Status: DC | PRN
  Administered 2015-10-05 – 2015-10-07 (×7): 50 mg via ORAL
  Filled 2015-10-05 (×7): qty 2

## 2015-10-05 MED ORDER — LEVOTHYROXINE SODIUM 150 MCG PO TABS: 150.0000 ug | ORAL_TABLET | Freq: Every day | ORAL | Status: DC

## 2015-10-05 MED ORDER — PANTOPRAZOLE SODIUM 40 MG PO TBEC
40.0000 mg | DELAYED_RELEASE_TABLET | Freq: Every day | ORAL | Status: DC
  Administered 2015-10-05 – 2015-10-07 (×3): 40 mg via ORAL
  Filled 2015-10-05 (×5): qty 1

## 2015-10-05 MED ORDER — TUBERCULIN PPD 5 UNIT/0.1ML ID SOLN
5.0000 [IU] | Freq: Once | INTRADERMAL | Status: AC
Start: 2015-10-05 — End: 2015-10-07
  Administered 2015-10-05: 5 [IU] via INTRADERMAL

## 2015-10-05 MED ORDER — SERTRALINE HCL 50 MG PO TABS
50.0000 mg | ORAL_TABLET | Freq: Every day | ORAL | Status: DC
  Administered 2015-10-05 – 2015-10-06 (×2): 50 mg via ORAL
  Filled 2015-10-05 (×4): qty 1

## 2015-10-05 MED ORDER — GABAPENTIN 400 MG PO CAPS
800.0000 mg | ORAL_CAPSULE | Freq: Three times a day (TID) | ORAL | Status: DC
  Administered 2015-10-05 – 2015-10-07 (×7): 800 mg via ORAL
  Filled 2015-10-05 (×12): qty 2

## 2015-10-05 NOTE — Progress Notes (Signed)
D: Patient observed in bed resting with eyes opened. Patient states she " wants to get better and come off alcohol. " Patient states she " drinks a case of beer a day. "  Patient continues to have withdrawal symptoms. Medication administered as ordered. Patients' response monitored. A: Support and encouragement offered. Q 15 minute checks in progress and maintained for safety.  R: Patient remains safe on unit. Patient denies SI and HI and A/V hallucinations. Monitoring continues.
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
## 2015-10-05 NOTE — Progress Notes (Signed)
Pt attended the NA group meeting.
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
## 2015-10-05 NOTE — BHH Suicide Risk Assessment (Signed)
[facility] INPATIENT:  Family/Significant Other Suicide Prevention Education  Suicide Prevention Education:  Patient Refusal for Family/Significant Other Suicide Prevention Education: The patient Jacqueline [name] has refused to provide written consent for family/significant other to be provided Family/Significant Other Suicide Prevention Education during admission and/or prior to discharge.  Physician notified.  PICKETT JR, Lyanne Kates C 10/05/2015, 10:34 AM

## 2015-10-05 NOTE — BHH Group Notes (Signed)
[facility] LCSW Group Therapy  10/05/2015 12:49 PM  Type of Therapy:  Group Therapy  Participation Level:  Active  Participation Quality:  Attentive  Affect:  Depressed and Flat  Cognitive:  Alert and Oriented  Insight:  Improving  Engagement in Therapy:  Improving  Modes of Intervention:  Confrontation, Discussion, Education, Exploration, Problem-solving, Rapport Building, Socialization and Support  Summary of Progress/Problems: Today's Topic: Overcoming Obstacles. Patients identified one short term goal and potential obstacles in reaching this goal. Patients processed barriers involved in overcoming these obstacles. Patients identified steps necessary for overcoming these obstacles and explored motivation (internal and external) for facing these difficulties head on. Jacqueline [name] was attentive and engaged during today's processing group. She shared that her biggest obstacle involves staying sober and getting into [facility] house. "I can't believe I relapsed again. I'm angry at myself. " Jacqueline [name] continues to show progress in the group setting with improving insight. Pt tearful at times, but states that she has hope regarding getting into recovery house and staying sober.   Smart, Lanell Dubie LCSW 10/05/2015, 12:49 PM

## 2015-10-05 NOTE — H&P (Signed)
Psychiatric Admission Assessment Adult  Patient Identification: Jacqueline [name] MRN:  [id number] Date of Evaluation:  10/05/2015 Chief Complaint:  Depressive Disorder Principal Diagnosis: <principal problem not specified> Diagnosis:   Patient Active Problem List   Diagnosis Date Noted  . Bipolar disorder, current episode depressed, severe, without psychotic features ([place]) [F31.4] 02/17/2015    Priority: High  . Alcohol dependence with withdrawal, uncomplicated ([place]) [id number] 10/17/2014    Priority: High  . PTSD (post-traumatic stress disorder) [F43.10] 08/03/2013    Priority: High  . Unspecified episodic mood disorder [F39] 05/14/2013    Priority: High  . Cocaine abuse, episodic [F14.10] 12/18/2011    Priority: High    Class: Acute  . Anxiety disorder [F41.9] 06/19/2011    Priority: High  . Alcohol use disorder, severe, dependence ([place]) [F10.20] 10/04/2015  . Alcoholism ([place]) [F10.20]   . Intentional ibuprofen overdose ([place]) [T39.312A] 10/17/2014  . Severe recurrent major depression without psychotic features ([place]) [F33.2] 10/17/2014  . Substance induced mood disorder ([place]) [F19.94] 10/17/2014  . Overdose [T50.901A]   . Suicidal ideation [R45.851]   . Persistent alcohol intoxication delirium with moderate or severe use disorder ([place]) [F10.221] 12/19/2013  . Hallucinations [R44.3] 04/14/2013  . Anastomotic ulcer, acute [K28.3] 03/23/2013  . Melena [K92.1] 03/21/2013  . Abnormal liver enzymes [R74.8] 12/18/2011  . PUD (peptic ulcer disease) [K27.9] 12/18/2011  . Bacterial vaginosis [N76.0, A49.9] 06/19/2011  . Anemia [D64.9] 06/17/2011  . Thrombocytopenia ([place]) [D69.6] 06/17/2011  . UTI (urinary tract infection) [N39.0] 06/16/2011  . Hypothyroidism [E03.9] 06/12/2011  . Polysubstance abuse [F19.10] 06/12/2011   History of Present Illness:: .49 Y/O female known to our service who states that after she was here last time 5/12-5/18 she had been staying with a friend from [place].  She admits that she has been drinking  on and off.  When she finds she is drinking out of control she goes to the  ED to get detox and gets back home and can abstain for a little while. States that she has been drinking pretty much every day for the last 2 months. Things she admits are getting worst, she is increasingly more depressed. Feels that the medications are not working and that she "self medicates." States that this time around she is serious about abstaining. She is going to [place] when she gets  out of here. Wants her medications to be adjusted or changed as feels the depression is getting worst.   Jacqueline [name] is an 49 y.o.divorced female who was brought to the [facility] tonight by her AA sponsor due to alcohol intoxication and SI. Pt sts that she was suicidal earlier and had a plan to walk into traffic. Pt sts that she has 2 previous suicide attempts, both ODs. Pt has been IVC'd. Pt sts that she needs detox and then can enter [facility] in [place]). Pt denies HI, SHI and AH. Pt sts that when she is "coming off of alcohol" she sees scary people. Pt sts she does not see these people when she is sober. Pt's BAL tonight was 391 and she tested positive for Benzos and Opiates. Pt admitted using alcohol, nicotine and benzos (Klonopin) daily to excess but no other recreational drugs. Pt sts that she currently drinks a case of beer per day and smokes 1 pack of cigarettes per day. Pt sts she stopped using cocaine about 1 year ago. Pt sts that her biggest stressors are that her daughters are addicted to pain killers and her  own alcoholism. Pt sts that in the last year she has gained about 70 pounds.   Associated Signs/Symptoms: Depression Symptoms:  depressed mood, anhedonia, insomnia, fatigue, feelings of worthlessness/guilt, difficulty concentrating, suicidal thoughts without plan, anxiety, panic attacks, loss of energy/fatigue, disturbed sleep, weight gain, (Hypo)  Manic Symptoms:  Irritable Mood, Labiality of Mood, Anxiety Symptoms:  Excessive Worry, Panic Symptoms, Psychotic Symptoms:  denies PTSD Symptoms: Had a traumatic exposure:  abuse Total Time spent with patient: 45 minutes  Past Psychiatric History:   Risk to Self: Is patient at risk for suicide?: Yes Risk to Others:   Prior Inpatient Therapy:  [facility] [place], [facility], [facility] Prior Outpatient Therapy:  [facility] Franchot Mimes  Alcohol Screening: 1. How often do you have a drink containing alcohol?: 4 or more times a week 2. How many drinks containing alcohol do you have on a typical day when you are drinking?: 10 or more 3. How often do you have six or more drinks on one occasion?: Daily or almost daily Preliminary Score: 8 4. How often during the last year have you found that you were not able to stop drinking once you had started?: Daily or almost daily 5. How often during the last year have you failed to do what was normally expected from you becasue of drinking?: Daily or almost daily 6. How often during the last year have you needed a first drink in the morning to get yourself going after a heavy drinking session?: Daily or almost daily 7. How often during the last year have you had a feeling of guilt of remorse after drinking?: Daily or almost daily 8. How often during the last year have you been unable to remember what happened the night before because you had been drinking?: Monthly 9. Have you or someone else been injured as a result of your drinking?: No 10. Has a relative or friend or a doctor or another health worker been concerned about your drinking or suggested you cut down?: Yes, during the last year Alcohol Use Disorder Identification Test Final Score (AUDIT): 34 Brief Intervention: Yes Substance Abuse History in the last 12 months:  Yes.   Consequences of Substance Abuse: Medical Consequences:  increased liver enzymes Withdrawal Symptoms:    Diaphoresis Nausea Tremors itching Previous Psychotropic Medications: Yes Prozac 60 Lithium 300 BID, Elavil 100 HS PRN Neurontin 800 mg TID Levothyroxine 150 mcg  Psychological Evaluations: No  Past Medical History:  Past Medical History  Diagnosis Date  . Depression   . Peptic ulcer   . Alcohol abuse   . Anemia   . Benzodiazepine abuse   . Thyroid disease   . Alcoholism ([place])   . Narcotic abuse   . Back pain   . Thrombocytopenia ([place]) 06/17/2011  . Hypothyroidism   . Seizures ([place])   . Hypoglycemia   . Suicide attempt ([place])     multiple times  . Anxiety     Past Surgical History  Procedure Laterality Date  . Cholecystectomy    . Abdominal surgery    . Esophagogastroduodenoscopy    . Gastric bypass    . Tubal ligation    . Esophagogastroduodenoscopy N/A 03/23/2013    Procedure: ESOPHAGOGASTRODUODENOSCOPY (EGD);  Surgeon: Ladene Artist, MD;  Location: [facility];  Service: Endoscopy;  Laterality: N/A;   Family History:  Family History  Problem Relation Age of Onset  . Alcohol abuse Father   . Alcoholism Father   . Cancer Other   Depression sister  and aunt, father alcohol Family Psychiatric  History:  Social History:  History  Alcohol Use  . Yes    Comment: drink 4 liters a day. 1 case/day drinks Listerine/ 21 days sober today 03/12/15, per pt     History  Drug Use  . Yes  . Special: Oxycodone, Cocaine, Benzodiazepines    Comment: benzo (Rx for klonopin) Last used cocaine over a year ago.     Social History   Social History  . Marital Status: Single    Spouse Name: N/A  . Number of Children: N/A  . Years of Education: N/A   Social History Main Topics  . Smoking status: Current Every Day Smoker    Types: Cigarettes  . Smokeless tobacco: Not on file  . Alcohol Use: Yes     Comment: drink 4 liters a day. 1 case/day drinks Listerine/ 21 days sober today 03/12/15, per pt  . Drug Use: Yes    Special: Oxycodone, Cocaine, Benzodiazepines     Comment: benzo  (Rx for klonopin) Last used cocaine over a year ago.   Marland Kitchen Sexual Activity: Yes    Birth Control/ Protection: Surgical   Other Topics Concern  . Not on file   Social History Narrative   ** Merged History Encounter **       ** Merged History Encounter **      Living with a friend, divorced, 2 daughters a son and 2 granddaughters. Waiting to hear from disability. Associates in science, did " nursing work" Additional Social History:    History of alcohol / drug use?: Yes Withdrawal Symptoms: Agitation, Diarrhea, Nausea / Vomiting, Tremors                    Allergies:   Allergies  Allergen Reactions  . Nsaids Other (See Comments)    G.I. Bleed   Lab Results:  Results for orders placed or performed during the hospital encounter of 10/04/15 (from the past 48 hour(s))  Lipid panel, fasting     Status: Abnormal   Collection Time: 10/05/15  6:11 AM  Result Value Ref Range   Cholesterol 210 (H) 0 - 200 mg/dL   Triglycerides 208 (H) <150 mg/dL   HDL 83 >40 mg/dL   Total CHOL/HDL Ratio 2.5 RATIO   VLDL 42 (H) 0 - 40 mg/dL   LDL Cholesterol 85 0 - 99 mg/dL    Comment:        Total Cholesterol/HDL:CHD Risk Coronary Heart Disease Risk Table                     Men   Women  1/2 Average Risk   3.4   3.3  Average Risk       5.0   4.4  2 X Average Risk   9.6   7.1  3 X Average Risk  23.4   11.0        Use the calculated Patient Ratio above and the CHD Risk Table to determine the patient's CHD Risk.        ATP III CLASSIFICATION (LDL):  <100     mg/dL   Optimal  100-129  mg/dL   Near or Above                    Optimal  130-159  mg/dL   Borderline  160-189  mg/dL   High  >190     mg/dL   Very High Performed at [facility]  Hospital     Metabolic Disorder Labs:  No results found for: HGBA1C, MPG No results found for: PROLACTIN Lab Results  Component Value Date   CHOL 210* 10/05/2015   TRIG 208* 10/05/2015   HDL 83 10/05/2015   CHOLHDL 2.5 10/05/2015   VLDL 42*  10/05/2015   LDLCALC 85 10/05/2015    Current Medications: Current Facility-Administered Medications  Medication Dose Route Frequency Provider Last Rate Last Dose  . acetaminophen (TYLENOL) tablet 650 mg  650 mg Oral Q6H PRN Encarnacion Slates, NP   650 mg at 10/05/15 0602  . alum & mag hydroxide-simeth (MAALOX/MYLANTA) 200-200-20 MG/5ML suspension 30 mL  30 mL Oral Q4H PRN Encarnacion Slates, NP      . hydrOXYzine (ATARAX/VISTARIL) tablet 25 mg  25 mg Oral Q6H PRN Encarnacion Slates, NP   25 mg at 10/05/15 0602  . loperamide (IMODIUM) capsule 2-4 mg  2-4 mg Oral PRN Encarnacion Slates, NP      . LORazepam (ATIVAN) tablet 1 mg  1 mg Oral Q6H PRN Encarnacion Slates, NP   1 mg at 10/04/15 1542  . LORazepam (ATIVAN) tablet 1 mg  1 mg Oral QID Encarnacion Slates, NP   1 mg at 10/05/15 1125   Followed by  . [START ON 10/06/2015] LORazepam (ATIVAN) tablet 1 mg  1 mg Oral TID Encarnacion Slates, NP       Followed by  . [START ON 10/07/2015] LORazepam (ATIVAN) tablet 1 mg  1 mg Oral BID Encarnacion Slates, NP       Followed by  . [START ON 10/08/2015] LORazepam (ATIVAN) tablet 1 mg  1 mg Oral Daily Encarnacion Slates, NP      . magnesium hydroxide (MILK OF MAGNESIA) suspension 30 mL  30 mL Oral Daily PRN Encarnacion Slates, NP      . multivitamin with minerals tablet 1 tablet  1 tablet Oral Daily Encarnacion Slates, NP   1 tablet at 10/04/15 1549  . nicotine (NICODERM CQ - dosed in mg/24 hours) patch 21 mg  21 mg Transdermal Daily Encarnacion Slates, NP   21 mg at 10/05/15 0827  . ondansetron (ZOFRAN-ODT) disintegrating tablet 4 mg  4 mg Oral Q6H PRN Encarnacion Slates, NP      . thiamine (VITAMIN B-1) tablet 100 mg  100 mg Oral Daily Encarnacion Slates, NP   100 mg at 10/05/15 0825  . traZODone (DESYREL) tablet 50 mg  50 mg Oral QHS Encarnacion Slates, NP   50 mg at 10/04/15 2156   PTA Medications: Prescriptions prior to admission  Medication Sig Dispense Refill Last Dose  . amitriptyline (ELAVIL) 100 MG tablet Take 1 tablet (100 mg total) by mouth at bedtime. 30  tablet 0 10/02/2015 at Unknown time  . clonazePAM (KLONOPIN) 1 MG tablet Take 1 mg by mouth 2 (two) times daily.   10/02/2015 at Unknown time  . FLUoxetine (PROZAC) 20 MG capsule Take 3 capsules (60 mg total) by mouth daily. 90 capsule 0 10/03/2015 at Unknown time  . gabapentin (NEURONTIN) 400 MG capsule Take 1 capsule (400 mg total) by mouth 3 (three) times daily. 30 capsule 0 10/02/2015 at Unknown time  . levothyroxine (SYNTHROID, LEVOTHROID) 150 MCG tablet Take 1 tablet (150 mcg total) by mouth daily before breakfast. 30 tablet 2 10/02/2015 at Unknown time  . lithium carbonate 150 MG capsule Take 1 capsule (150 mg total) by mouth 2 (two) times daily with  a meal. (Patient taking differently: Take 300 mg by mouth 2 (two) times daily with a meal. ) 60 capsule 0 10/02/2015 at Unknown time  . omeprazole (PRILOSEC) 40 MG capsule Take 1 capsule (40 mg total) by mouth daily. 30 capsule 0 10/02/2015 at Unknown time    Musculoskeletal: Strength & Muscle Tone: within normal limits Gait & Station: normal Patient leans: normal  Psychiatric Specialty Exam: Physical Exam  Review of Systems  Constitutional: Positive for malaise/fatigue.  Eyes: Negative.   Respiratory:       Pack a day  Cardiovascular: Negative.   Gastrointestinal: Positive for nausea.  Genitourinary: Negative.   Musculoskeletal: Positive for myalgias and back pain.  Skin: Positive for rash.  Neurological: Positive for weakness and headaches.  Endo/Heme/Allergies: Negative.   Psychiatric/Behavioral: Positive for depression and substance abuse. The patient is nervous/anxious and has insomnia.     Blood pressure 109/68, pulse 118, temperature 99.1 F (37.3 C), temperature source Oral, resp. rate 16, height 5' 8.31" (1.735 m), weight 106.595 kg (235 lb), last menstrual period 09/09/2015, SpO2 100 %.Body mass index is 35.41 kg/(m^2).  General Appearance: Disheveled  Eye Sport and exercise psychologist::  Fair  Speech:  Clear and Coherent  Volume:   Decreased  Mood:  Anxious and Depressed  Affect:  Depressed and worried  Thought Process:  Coherent and Goal Directed  Orientation:  Full (Time, Place, and Person)  Thought Content:  symptoms events worries concerns  Suicidal Thoughts:  No  Homicidal Thoughts:  No  Memory:  Immediate;   Fair Recent;   Fair Remote;   Fair  Judgement:  Fair  Insight:  Shallow  Psychomotor Activity:  Restlessness  Concentration:  Fair  Recall:  [employer] of Knowledge:Fair  Language: Fair  Akathisia:  No  Handed:  Right  AIMS (if indicated):     Assets:  Desire for Improvement  ADL's:  Intact  Cognition: WNL  Sleep:  Number of Hours: 6.75     Treatment Plan Summary: Daily contact with patient to assess and evaluate symptoms and progress in treatment and Medication management Supportive approach/coping skills Alcohol dependence: Librium Detox protocol/work a relapse prevention plan Depression; will D/C the Prozac, states it is not working for her. Feels Zoloft worked the best, would like to be placed back on it Anxiety; will continue the Neurontin. She feels it helps her and would like to continue it even considering the possibility of weight gain Insomnia; will D/C the Elavil, she feels it is not working and feels it is the culprit for her weight gain Will work with CBT/mindfulness Will facilitate admission to the [place], will do a TB test that they requiere Observation Level/Precautions:  15 minute checks  Laboratory:  As per the ED and TSH  Psychotherapy:  Individual/group  Medications:  Will detox with Ativan and reassess her other psychotropics  Consultations:    Discharge Concerns:  Need for a residential treatment program  Estimated LOS: 3-5 days  Other:     I certify that inpatient services furnished can reasonably be expected to improve the patient's condition.   [place] A 12/28/201612:56 PM
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
## 2015-10-05 NOTE — Progress Notes (Signed)
Patient ID: Jacqueline [name], female   DOB: Mar 06, 1966, 49 y.o.   MRN: [id number] D:Affect is flat/sad at times,mood is depressed/anxious. Minor signs of withdrawal noted. Isolating to room at times but has attended some groups.Rates depression high and says that she can't believe that she let herself relapse again. A:Support and encouragement offered. R:Receptive. No complaints of pain or problems at this time.

## 2015-10-05 NOTE — BHH Suicide Risk Assessment (Signed)
[place] INPATIENT:  Family/Significant Other Suicide Prevention Education  Suicide Prevention Education:  Patient Refusal for Family/Significant Other Suicide Prevention Education: The patient Jacqueline [name] has refused to provide written consent for family/significant other to be provided Family/Significant Other Suicide Prevention Education during admission and/or prior to discharge.  Physician notified.  SPE completed with pt, as pt refused to consent to family contact. SPI pamphlet provided to pt and pt was encouraged to share information with support network, ask questions, and talk about any concerns relating to SPE. Pt denies access to guns/firearms and verbalized understanding of information provided. Mobile Crisis information also provided to pt.   Smart, Sincerity Cedar LCSW 10/05/2015, 1:04 PM

## 2015-10-06 MED ORDER — INFLUENZA VAC SPLIT QUAD 0.5 ML IM SUSY
0.5000 mL | PREFILLED_SYRINGE | INTRAMUSCULAR | Status: AC
  Administered 2015-10-07: 0.5 mL via INTRAMUSCULAR
  Filled 2015-10-06: qty 0.5

## 2015-10-06 MED ORDER — SERTRALINE HCL 100 MG PO TABS
100.0000 mg | ORAL_TABLET | Freq: Every day | ORAL | Status: DC
Start: 2015-10-07 — End: 2015-10-07
  Administered 2015-10-07: 100 mg via ORAL
  Filled 2015-10-06 (×2): qty 1
  Filled 2015-10-06: qty 14

## 2015-10-06 MED ORDER — SERTRALINE HCL 50 MG PO TABS
50.0000 mg | ORAL_TABLET | Freq: Once | ORAL | Status: AC
  Administered 2015-10-06: 50 mg via ORAL
  Filled 2015-10-06 (×2): qty 1

## 2015-10-06 MED ORDER — BISACODYL 10 MG RE SUPP
10.0000 mg | Freq: Every day | RECTAL | Status: DC | PRN
  Administered 2015-10-06 – 2015-10-07 (×2): 10 mg via RECTAL
  Filled 2015-10-06 (×2): qty 1

## 2015-10-06 NOTE — Progress Notes (Signed)
Adult Psychoeducational Group Note  Date:  10/06/2015 Time:  9:01 PM  Group Topic/Focus:  Wrap-Up Group:   The focus of this group is to help patients review their daily goal of treatment and discuss progress on daily workbooks.  Participation Level:  Active  Participation Quality:  Attentive  Affect:  Appropriate  Cognitive:  Appropriate  Insight: Appropriate  Engagement in Group:  Engaged  Modes of Intervention:  Education  Additional Comments:  Pt had a good day. Pt goal for tomorrow is to go home.   Jerline Pain 10/06/2015, 9:01 PM

## 2015-10-06 NOTE — Progress Notes (Signed)
[facility] MD Progress Note  10/06/2015 7:34 PM Jacqueline [name]  MRN:  [id number] Subjective:  Jacqueline [name] continues to work on getting herself together. Still endorsing some underlying depression and anxiety. She is looking forward to being admitted to the [facility] and does not want anything to mess up with this process. She still feels that the Zoloft is a better option for her to help with the depression and anxiety. Would like to be on the least amount of meds as possible Principal Problem: Alcohol dependence with withdrawal, uncomplicated ([place]) Diagnosis:   Patient Active Problem List   Diagnosis Date Noted  . Bipolar disorder, current episode depressed, severe, without psychotic features ([place]) [F31.4] 02/17/2015    Priority: High  . Alcohol dependence with withdrawal, uncomplicated ([place]) [id number] 10/17/2014    Priority: High  . PTSD (post-traumatic stress disorder) [F43.10] 08/03/2013    Priority: High  . Unspecified episodic mood disorder [F39] 05/14/2013    Priority: High  . Cocaine abuse, episodic [F14.10] 12/18/2011    Priority: High    Class: Acute  . Anxiety disorder [F41.9] 06/19/2011    Priority: High  . Alcohol use disorder, severe, dependence ([place]) [F10.20] 10/04/2015  . Alcoholism ([place]) [F10.20]   . Intentional ibuprofen overdose ([place]) [T39.312A] 10/17/2014  . Severe recurrent major depression without psychotic features ([place]) [F33.2] 10/17/2014  . Substance induced mood disorder ([place]) [F19.94] 10/17/2014  . Overdose [T50.901A]   . Suicidal ideation [R45.851]   . Persistent alcohol intoxication delirium with moderate or severe use disorder ([place]) [F10.221] 12/19/2013  . Hallucinations [R44.3] 04/14/2013  . Anastomotic ulcer, acute [K28.3] 03/23/2013  . Melena [K92.1] 03/21/2013  . Abnormal liver enzymes [R74.8] 12/18/2011  . PUD (peptic ulcer disease) [K27.9] 12/18/2011  . Bacterial vaginosis [N76.0, A49.9] 06/19/2011  . Anemia [D64.9] 06/17/2011  . Thrombocytopenia  ([place]) [D69.6] 06/17/2011  . UTI (urinary tract infection) [N39.0] 06/16/2011  . Hypothyroidism [E03.9] 06/12/2011  . Polysubstance abuse [F19.10] 06/12/2011   Total Time spent with patient: 30 minutes  Past Psychiatric History: see admission H and p  Past Medical History:  Past Medical History  Diagnosis Date  . Depression   . Peptic ulcer   . Alcohol abuse   . Anemia   . Benzodiazepine abuse   . Thyroid disease   . Alcoholism ([place])   . Narcotic abuse   . Back pain   . Thrombocytopenia ([place]) 06/17/2011  . Hypothyroidism   . Seizures ([place])   . Hypoglycemia   . Suicide attempt ([place])     multiple times  . Anxiety     Past Surgical History  Procedure Laterality Date  . Cholecystectomy    . Abdominal surgery    . Esophagogastroduodenoscopy    . Gastric bypass    . Tubal ligation    . Esophagogastroduodenoscopy N/A 03/23/2013    Procedure: ESOPHAGOGASTRODUODENOSCOPY (EGD);  Surgeon: Ladene Artist, MD;  Location: [facility];  Service: Endoscopy;  Laterality: N/A;   Family History:  Family History  Problem Relation Age of Onset  . Alcohol abuse Father   . Alcoholism Father   . Cancer Other    Family Psychiatric  History: see admission H and P Social History:  History  Alcohol Use  . Yes    Comment: drink 4 liters a day. 1 case/day drinks Listerine/ 21 days sober today 03/12/15, per pt     History  Drug Use  . Yes  . Special: Oxycodone, Cocaine, Benzodiazepines    Comment: benzo (Rx for klonopin) Last  used cocaine over a year ago.     Social History   Social History  . Marital Status: Single    Spouse Name: N/A  . Number of Children: N/A  . Years of Education: N/A   Social History Main Topics  . Smoking status: Current Every Day Smoker    Types: Cigarettes  . Smokeless tobacco: None  . Alcohol Use: Yes     Comment: drink 4 liters a day. 1 case/day drinks Listerine/ 21 days sober today 03/12/15, per pt  . Drug Use: Yes    Special: Oxycodone, Cocaine,  Benzodiazepines     Comment: benzo (Rx for klonopin) Last used cocaine over a year ago.   Marland Kitchen Sexual Activity: Yes    Birth Control/ Protection: Surgical   Other Topics Concern  . None   Social History Narrative   ** Merged History Encounter **       ** Merged History Encounter **       Additional Social History:    History of alcohol / drug use?: Yes Withdrawal Symptoms: Agitation, Diarrhea, Nausea / Vomiting, Tremors                    Sleep: Fair  Appetite:  Fair  Current Medications: Current Facility-Administered Medications  Medication Dose Route Frequency Provider Last Rate Last Dose  . acetaminophen (TYLENOL) tablet 650 mg  650 mg Oral Q6H PRN Encarnacion Slates, NP   650 mg at 10/06/15 1851  . alum & mag hydroxide-simeth (MAALOX/MYLANTA) 200-200-20 MG/5ML suspension 30 mL  30 mL Oral Q4H PRN Encarnacion Slates, NP      . bisacodyl (DULCOLAX) suppository 10 mg  10 mg Rectal Daily PRN Nicholaus Bloom, MD   10 mg at 10/06/15 1528  . diphenhydrAMINE (BENADRYL) capsule 50 mg  50 mg Oral Q4H PRN Nicholaus Bloom, MD   50 mg at 10/06/15 1805  . gabapentin (NEURONTIN) capsule 800 mg  800 mg Oral TID Nicholaus Bloom, MD   800 mg at 10/06/15 1805  . hydrOXYzine (ATARAX/VISTARIL) tablet 25 mg  25 mg Oral Q6H PRN Encarnacion Slates, NP   25 mg at 10/05/15 0602  . [START ON 10/07/2015] Influenza vac split quadrivalent PF (FLUARIX) injection 0.5 mL  0.5 mL Intramuscular Tomorrow-1000 Nicholaus Bloom, MD      . levothyroxine (SYNTHROID, LEVOTHROID) tablet 150 mcg  150 mcg Oral QAC breakfast Nicholaus Bloom, MD   150 mcg at 10/06/15 [phone]  . loperamide (IMODIUM) capsule 2-4 mg  2-4 mg Oral PRN Encarnacion Slates, NP      . LORazepam (ATIVAN) tablet 1 mg  1 mg Oral Q6H PRN Encarnacion Slates, NP   1 mg at 10/04/15 1542  . [START ON 10/07/2015] LORazepam (ATIVAN) tablet 1 mg  1 mg Oral BID Encarnacion Slates, NP       Followed by  . [START ON 10/08/2015] LORazepam (ATIVAN) tablet 1 mg  1 mg Oral Daily Encarnacion Slates, NP       . magnesium hydroxide (MILK OF MAGNESIA) suspension 30 mL  30 mL Oral Daily PRN Encarnacion Slates, NP   30 mL at 10/06/15 0816  . multivitamin with minerals tablet 1 tablet  1 tablet Oral Daily Encarnacion Slates, NP   1 tablet at 10/04/15 1549  . nicotine (NICODERM CQ - dosed in mg/24 hours) patch 21 mg  21 mg Transdermal Daily Encarnacion Slates, NP   21 mg at  10/06/15 0815  . ondansetron (ZOFRAN-ODT) disintegrating tablet 4 mg  4 mg Oral Q6H PRN Encarnacion Slates, NP      . pantoprazole (PROTONIX) EC tablet 40 mg  40 mg Oral Daily Nicholaus Bloom, MD   40 mg at 10/06/15 0813  . [START ON 10/07/2015] sertraline (ZOLOFT) tablet 100 mg  100 mg Oral Daily Nicholaus Bloom, MD      . thiamine (VITAMIN B-1) tablet 100 mg  100 mg Oral Daily Encarnacion Slates, NP   100 mg at 10/06/15 0813  . traZODone (DESYREL) tablet 50 mg  50 mg Oral QHS Encarnacion Slates, NP   50 mg at 10/04/15 2156  . tuberculin injection 5 Units  5 Units Intradermal Once Nicholaus Bloom, MD   5 Units at 10/05/15 1353    Lab Results:  Results for orders placed or performed during the hospital encounter of 10/04/15 (from the past 48 hour(s))  Lipid panel, fasting     Status: Abnormal   Collection Time: 10/05/15  6:11 AM  Result Value Ref Range   Cholesterol 210 (H) 0 - 200 mg/dL   Triglycerides 208 (H) <150 mg/dL   HDL 83 >40 mg/dL   Total CHOL/HDL Ratio 2.5 RATIO   VLDL 42 (H) 0 - 40 mg/dL   LDL Cholesterol 85 0 - 99 mg/dL    Comment:        Total Cholesterol/HDL:CHD Risk Coronary Heart Disease Risk Table                     Men   Women  1/2 Average Risk   3.4   3.3  Average Risk       5.0   4.4  2 X Average Risk   9.6   7.1  3 X Average Risk  23.4   11.0        Use the calculated Patient Ratio above and the CHD Risk Table to determine the patient's CHD Risk.        ATP III CLASSIFICATION (LDL):  <100     mg/dL   Optimal  100-129  mg/dL   Near or Above                    Optimal  130-159  mg/dL   Borderline  160-189  mg/dL   High  >190      mg/dL   Very High Performed at [facility]   TSH     Status: Abnormal   Collection Time: 10/05/15  6:15 PM  Result Value Ref Range   TSH 6.319 (H) 0.350 - 4.500 uIU/mL    Comment: Performed at [facility]    Physical Findings: AIMS: Facial and Oral Movements Muscles of Facial Expression: None, normal Lips and Perioral Area: None, normal Jaw: None, normal Tongue: None, normal,Extremity Movements Upper (arms, wrists, hands, fingers): None, normal Lower (legs, knees, ankles, toes): None, normal, Trunk Movements Neck, shoulders, hips: None, normal, Overall Severity Severity of abnormal movements (highest score from questions above): None, normal Incapacitation due to abnormal movements: None, normal Patient's awareness of abnormal movements (rate only patient's report): No Awareness, Dental Status Current problems with teeth and/or dentures?: No Does patient usually wear dentures?: No  CIWA:  CIWA-Ar Total: 4 COWS:     Musculoskeletal: Strength & Muscle Tone: within normal limits Gait & Station: normal Patient leans: normal  Psychiatric Specialty Exam: Review of Systems  Constitutional: Negative.  HENT: Negative.   Eyes: Negative.   Respiratory: Negative.   Cardiovascular: Negative.   Gastrointestinal: Negative.   Genitourinary: Negative.   Musculoskeletal: Negative.   Skin: Negative.   Neurological: Negative.   Endo/Heme/Allergies: Negative.   Psychiatric/Behavioral: Positive for depression and substance abuse. The patient is nervous/anxious.     Blood pressure 132/64, pulse 75, temperature 98 F (36.7 C), temperature source Oral, resp. rate 18, height 5' 8.31" (1.735 m), weight 106.595 kg (235 lb), last menstrual period 09/09/2015, SpO2 100 %.Body mass index is 35.41 kg/(m^2).  General Appearance: Fairly Groomed  Engineer, water::  Fair  Speech:  Clear and Coherent  Volume:  Normal  Mood:  Anxious and worried  Affect:  anxious worried   Thought Process:  Coherent and Goal Directed  Orientation:  Full (Time, Place, and Person)  Thought Content:  Symptoms events worries concerns  Suicidal Thoughts:  No  Homicidal Thoughts:  No  Memory:  Immediate;   Fair Recent;   Fair Remote;   Fair  Judgement:  Fair  Insight:  Present and Shallow  Psychomotor Activity:  Restlessness  Concentration:  Fair  Recall:  [employer] of Knowledge:Fair  Language: Fair  Akathisia:  No  Handed:  Right  AIMS (if indicated):     Assets:  Desire for Improvement  ADL's:  Intact  Cognition: WNL  Sleep:  Number of Hours: 6.75   Treatment Plan Summary: Daily contact with patient to assess and evaluate symptoms and progress in treatment and Medication management  Supportive approach/coping skills Alcohol dependence; complete the Ativan detox protocol Work a relapse prevention plan Depression/anxiety; increase the Zoloft to 100 mg daily will give extra 50 mg today Work with CBT/mindfulness Facilitate admission to [facility] 10/06/2015, 7:34 PM

## 2015-10-06 NOTE — BHH Group Notes (Signed)
[place] LCSW Group Therapy  10/06/2015 12:48 PM  Type of Therapy:  Group Therapy  Participation Level:  Did Not Attend-pt chose to rest in room.   Modes of Intervention:  Confrontation, Discussion, Education, Exploration, Problem-solving, Rapport Building, Socialization and Support  Summary of Progress/Problems:  Finding Balance in Life. Today's group focused on defining balance in one's own words, identifying things that can knock one off balance, and exploring healthy ways to maintain balance in life. Group members were asked to provide an example of a time when they felt off balance, describe how they handled that situation,and process healthier ways to regain balance in the future. Group members were asked to share the most important tool for maintaining balance that they learned while at [facility] and how they plan to apply this method after discharge.   Smart, Breane Grunwald LCSW 10/06/2015, 12:48 PM

## 2015-10-06 NOTE — Progress Notes (Signed)
D: Jacqueline [name] denies SI/HI/AVH but admits lower back pain of a 5, for which Tylenol was helpful. She reports has last BM was three days ago. On her self inventory, she rated her hopelessness 3, depression 5, and anxiety 5. She reported fair sleep and appetite, low energy, and good concentration. She is curious about her discharge date.  A: Meds given as ordered, including MOM for constipation. Flu shot ordered upon pt request.  Q15 safety checks maintained. Support/encouragement offered. R: Pt remains free from harm and continues with treatment. Will continue to monitor for needs/safety.

## 2015-10-06 NOTE — Progress Notes (Signed)
Patient ID: Jacqueline [name], female   DOB: [birth year], 49 y.o.   MRN: [id number]  D: Patient c/o itching from eczema. Pt reports decreased anxiety and depressive symptoms. Pt thought process is organized and behavior is appropriate. Pt denies SI/HI/AVH and             pain.  Pt denies any needs or concerns. Cooperative with assessment.  A: Met with pt 1:1.  Writer encouraged pt to discuss feelings. Pt encouraged to come to staff with any questions or concerns.  R: Patient is safe on the unit.

## 2015-10-06 NOTE — Progress Notes (Signed)
D: Patient alert and oriented x 4. Patient denies pain/SI/HI/AVH. Patient complains of itching and requested benadryl at 2126. PRN dose was given with effective results.  A: Staff to monitor Q 15 mins for safety. Encouragement and support offered. Scheduled medications administered per orders. R: Patient remains safe on the unit. Patient attended group tonight. Patient visible on hte unit and interacting with peers. Patient taking administered medications.

## 2015-10-07 MED ORDER — GABAPENTIN 400 MG PO CAPS: 800.0000 mg | ORAL_CAPSULE | Freq: Three times a day (TID) | ORAL | Status: DC

## 2015-10-07 MED ORDER — HYDROXYZINE HCL 25 MG PO TABS
25.0000 mg | ORAL_TABLET | Freq: Four times a day (QID) | ORAL | Status: DC | PRN
  Filled 2015-10-07: qty 20

## 2015-10-07 MED ORDER — HYDROXYZINE HCL 25 MG PO TABS: ORAL_TABLET | ORAL | Status: DC

## 2015-10-07 MED ORDER — SERTRALINE HCL 100 MG PO TABS: 100.0000 mg | ORAL_TABLET | Freq: Every day | ORAL | Status: DC

## 2015-10-07 MED ORDER — NICOTINE 21 MG/24HR TD PT24: 21.0000 mg | MEDICATED_PATCH | Freq: Every day | TRANSDERMAL | Status: DC

## 2015-10-07 MED ORDER — OMEPRAZOLE 20 MG PO CPDR
40.0000 mg | DELAYED_RELEASE_CAPSULE | Freq: Every day | ORAL | Status: DC
  Filled 2015-10-07 (×2): qty 28

## 2015-10-07 MED ORDER — OMEPRAZOLE 40 MG PO CPDR: 40.0000 mg | DELAYED_RELEASE_CAPSULE | Freq: Every day | ORAL | Status: DC

## 2015-10-07 MED ORDER — GABAPENTIN 800 MG PO TABS
800.0000 mg | ORAL_TABLET | Freq: Three times a day (TID) | ORAL | Status: DC
  Filled 2015-10-07 (×3): qty 42

## 2015-10-07 MED ORDER — LEVOTHYROXINE SODIUM 150 MCG PO TABS: 150.0000 ug | ORAL_TABLET | Freq: Every day | ORAL | Status: DC
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
## 2015-10-07 NOTE — Progress Notes (Signed)
Pt discharged home with her female friend. Pt was ambulatory, stable, and appreciative at that time. All papers and prescriptions were given and valuables returned. Verbal understanding expressed. Denies SI/HI and A/VH. Pt given opportunity to express concerns and ask questions.
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
## 2015-10-07 NOTE — Plan of Care (Signed)
Problem: Alteration in mood & ability to function due to Goal: STG: Patient verbalizes decreases in signs of withdrawal Outcome: Progressing Pt denies any withdrawal symptoms     

## 2015-10-07 NOTE — Discharge Summary (Signed)
Physician Discharge Summary Note  Patient:  Jacqueline [name] is an 49 y.o., female MRN:  [id number] DOB:  12-19-1965 Patient phone:  [phone] (home)  Patient address:   [address] [place] [address],  Total Time spent with patient: Greater than 30 minutes  Date of Admission:  10/04/2015  Date of Discharge: 10/07/2015  Reason for Admission: Alcohol detox/mood stabilization    Principal Problem: Alcohol dependence with withdrawal, uncomplicated [facility])  Discharge Diagnoses: Patient Active Problem List   Diagnosis Date Noted  . Alcohol use disorder, severe, dependence ([place]) [F10.20] 10/04/2015  . Bipolar disorder, current episode depressed, severe, without psychotic features ([place]) [F31.4] 02/17/2015  . Alcoholism ([place]) [F10.20]   . Alcohol dependence with withdrawal, uncomplicated ([place]) [id number] 10/17/2014  . Intentional ibuprofen overdose ([place]) [T39.312A] 10/17/2014  . Severe recurrent major depression without psychotic features ([place]) [F33.2] 10/17/2014  . Substance induced mood disorder ([place]) [F19.94] 10/17/2014  . Overdose [T50.901A]   . Suicidal ideation [R45.851]   . Persistent alcohol intoxication delirium with moderate or severe use disorder ([place]) [F10.221] 12/19/2013  . PTSD (post-traumatic stress disorder) [F43.10] 08/03/2013  . Unspecified episodic mood disorder [F39] 05/14/2013  . Hallucinations [R44.3] 04/14/2013  . Anastomotic ulcer, acute [K28.3] 03/23/2013  . Melena [K92.1] 03/21/2013  . Abnormal liver enzymes [R74.8] 12/18/2011  . Cocaine abuse, episodic [F14.10] 12/18/2011    Class: Acute  . PUD (peptic ulcer disease) [K27.9] 12/18/2011  . Anxiety disorder [F41.9] 06/19/2011  . Bacterial vaginosis [N76.0, A49.9] 06/19/2011  . Anemia [D64.9] 06/17/2011  . Thrombocytopenia ([place]) [D69.6] 06/17/2011  . UTI (urinary tract infection) [N39.0] 06/16/2011  . Hypothyroidism [E03.9] 06/12/2011  . Polysubstance abuse [F19.10] 06/12/2011    Musculoskeletal: Strength & Muscle Tone: within normal limits Gait & Station: normal Patient leans: N/A  Psychiatric Specialty Exam:  See Suicide Risk Assessment Physical Exam  Nursing note and vitals reviewed. Constitutional: She is oriented to person, place, and time. She appears well-developed.  HENT:  Head: Normocephalic.  Eyes: Pupils are equal, round, and reactive to light.  Neck: Normal range of motion.  Cardiovascular: Normal rate.   Respiratory: Effort normal.  GI: Soft.  Genitourinary:  Denies any issues in this area   Musculoskeletal: Normal range of motion.  Neurological: She is alert and oriented to person, place, and time.  Skin: Skin is warm and dry.    Review of Systems  Constitutional: Negative.   HENT: Negative.   Eyes: Negative.   Respiratory: Negative.   Cardiovascular: Negative.   Gastrointestinal: Negative.   Genitourinary: Negative.   Musculoskeletal: Negative.   Skin: Negative.   Neurological: Negative.   Endo/Heme/Allergies: Negative.   Psychiatric/Behavioral: Positive for depression (Stable) and substance abuse (Alcoholism, chronic). Negative for suicidal ideas, hallucinations and memory loss. The patient has insomnia (Stable). The patient is not nervous/anxious.   All other systems reviewed and are negative.   Blood pressure 114/71, pulse 106, temperature 97.9 F (36.6 C), temperature source Oral, resp. rate 18, height 5' 8.31" (1.735 m), weight 106.595 kg (235 lb), last menstrual period 09/09/2015, SpO2 100 %.Body mass index is 35.41 kg/(m^2).  Have you used any form of tobacco in the last 30 days? (Cigarettes, Smokeless Tobacco, Cigars, and/or Pipes): Yes  Has this patient used any form of tobacco in the last 30 days? (Cigarettes, Smokeless Tobacco, Cigars, and/or Pipes) Yes, A prescription for an FDA-approved tobacco cessation medication was offered at discharge and the patient refused  Past Medical History:  Past Medical History   Diagnosis Date  .  Depression   . Peptic ulcer   . Alcohol abuse   . Anemia   . Benzodiazepine abuse   . Thyroid disease   . Alcoholism ([place])   . Narcotic abuse   . Back pain   . Thrombocytopenia ([place]) 06/17/2011  . Hypothyroidism   . Seizures ([place])   . Hypoglycemia   . Suicide attempt ([place])     multiple times  . Anxiety     Past Surgical History  Procedure Laterality Date  . Cholecystectomy    . Abdominal surgery    . Esophagogastroduodenoscopy    . Gastric bypass    . Tubal ligation    . Esophagogastroduodenoscopy N/A 03/23/2013    Procedure: ESOPHAGOGASTRODUODENOSCOPY (EGD);  Surgeon: Ladene Artist, MD;  Location: [facility];  Service: Endoscopy;  Laterality: N/A;   Family History:  Family History  Problem Relation Age of Onset  . Alcohol abuse Father   . Alcoholism Father   . Cancer Other    Social History:  History  Alcohol Use  . Yes    Comment: drink 4 liters a day. 1 case/day drinks Listerine/ 21 days sober today 03/12/15, per pt     History  Drug Use  . Yes  . Special: Oxycodone, Cocaine, Benzodiazepines    Comment: benzo (Rx for klonopin) Last used cocaine over a year ago.     Social History   Social History  . Marital Status: Single    Spouse Name: N/A  . Number of Children: N/A  . Years of Education: N/A   Social History Main Topics  . Smoking status: Current Every Day Smoker    Types: Cigarettes  . Smokeless tobacco: None  . Alcohol Use: Yes     Comment: drink 4 liters a day. 1 case/day drinks Listerine/ 21 days sober today 03/12/15, per pt  . Drug Use: Yes    Special: Oxycodone, Cocaine, Benzodiazepines     Comment: benzo (Rx for klonopin) Last used cocaine over a year ago.   Marland Kitchen Sexual Activity: Yes    Birth Control/ Protection: Surgical   Other Topics Concern  . None   Social History Narrative   ** Merged History Encounter **       ** Merged History Encounter **       Risk to Self: Is patient at risk for suicide?: Yes Risk to  Others: No Prior Inpatient Therapy: Yes Prior Outpatient Therapy: Yes  Level of Care:  OP  Hospital Course: 49 Y/O female known to our service who states that after she was here last time 5/12-5/16 she had been staying with a friend from [place]. She admits that she has been drinking on and off. When she finds she is drinking out of control she goes to theED to get detox and gets back home and can abstain for a little while. States that she has been drinking pretty much every day for the last 2 months. Things she admits are getting worst, she is increasingly more depressed. Feels that the medications are not working and that she "self medicates." States that this time around she is serious about abstaining. She is going to [place] when she gets out of here. Wants her medications to be adjusted or changed as feels the depression is getting worst.   Nykhia was admitted to the hospital for alcohol detoxification/mood stabilization treatments. Her blood alcohol level upon admission was 391 per toxicology tests reports & UDS positive for Benzodiazepine & opiates. She was intoxicated. Mariene's lab  reports also indicated elevated liver enzymes (AST & ALT) possibly from chronic alcoholism. As a result, not a candidate for Librium detox protocols. This is because, Librium is a long acting Benzodiazepine with a long half-life. If used for this particular detox treatment will impose heavily on already compromised liver enzymes. Ativan detox protocols was used instead. By using ativan detox protocols, Joelene Millin received a cleaner detoxification treatment without the lingering adverse effects of the long acting Librium regimen.  Besides the detox treatment, Selena also was medicated & discharged on Neurontin 400 mg for substance withdrawal syndrome/agitation, Hydroxyzine 25 mg for anxiety & Sertraline 100 mg for depression. She was resumed on all her pertinent home medications for her other medical issues that  she presented. She tolerated her treatment regimen without any significant adverse effects and or reactions reported. Joelene Millin participated in the [place] meetings and group counseling sessions being offered and held on this unit. She learned coping skills.  Earle has completed detox treatment & her mood is stable. This is evidenced by her reports of improved mood & absence of substance withdrawal symptoms. She is currently being discharged to the [facility] where she will reside to try to maintain sobriety. She will continue psychiatric treatment & medication management as noted below on an outpatient basis. She has been given all the necessary information needed to make this appointments without problems. Upon discharge, she denies any SIHI, AVH, delusional thoughts, paranoia and or substance withdrawal symptoms. She received some samples of her [facility] discharge medicines from the [facility]. She left [facility] with all personal belongings in no distress. Transportation per her sponsor.   Consults:  psychiatry  Discharge Vitals:   Blood pressure 114/71, pulse 106, temperature 97.9 F (36.6 C), temperature source Oral, resp. rate 18, height 5' 8.31" (1.735 m), weight 106.595 kg (235 lb), last menstrual period 09/09/2015, SpO2 100 %. Body mass index is 35.41 kg/(m^2).  Lab Results:   Results for orders placed or performed during the hospital encounter of 10/04/15 (from the past 72 hour(s))  Lipid panel, fasting     Status: Abnormal   Collection Time: 10/05/15  6:11 AM  Result Value Ref Range   Cholesterol 210 (H) 0 - 200 mg/dL   Triglycerides 208 (H) <150 mg/dL   HDL 83 >40 mg/dL   Total CHOL/HDL Ratio 2.5 RATIO   VLDL 42 (H) 0 - 40 mg/dL   LDL Cholesterol 85 0 - 99 mg/dL    Comment:        Total Cholesterol/HDL:CHD Risk Coronary Heart Disease Risk Table                     Men   Women  1/2 Average Risk   3.4   3.3  Average Risk       5.0   4.4  2 X Average Risk   9.6   7.1  3 X Average  Risk  23.4   11.0        Use the calculated Patient Ratio above and the CHD Risk Table to determine the patient's CHD Risk.        ATP III CLASSIFICATION (LDL):  <100     mg/dL   Optimal  100-129  mg/dL   Near or Above                    Optimal  130-159  mg/dL   Borderline  160-189  mg/dL   High  >190  mg/dL   Very High Performed at [facility]   TSH     Status: Abnormal   Collection Time: 10/05/15  6:15 PM  Result Value Ref Range   TSH 6.319 (H) 0.350 - 4.500 uIU/mL    Comment: Performed at [facility]   Physical Findings: AIMS: Facial and Oral Movements Muscles of Facial Expression: None, normal Lips and Perioral Area: None, normal Jaw: None, normal Tongue: None, normal,Extremity Movements Upper (arms, wrists, hands, fingers): None, normal Lower (legs, knees, ankles, toes): None, normal, Trunk Movements Neck, shoulders, hips: None, normal, Overall Severity Severity of abnormal movements (highest score from questions above): None, normal Incapacitation due to abnormal movements: None, normal Patient's awareness of abnormal movements (rate only patient's report): No Awareness, Dental Status Current problems with teeth and/or dentures?: No Does patient usually wear dentures?: No  CIWA:  CIWA-Ar Total: 2 COWS:     See Psychiatric Specialty Exam and Suicide Risk Assessment completed by Attending Physician prior to discharge.  Discharge destination:  Other:  home, then to [facility]  Is patient on multiple antipsychotic therapies at discharge:  No   Has Patient had three or more failed trials of antipsychotic monotherapy by history:  No  Recommended Plan for Multiple Antipsychotic Therapies: NA     Discharge Instructions    Diet - low sodium heart healthy    Complete by:  As directed             Medication List    STOP taking these medications        amitriptyline 100 MG tablet  Commonly known as:  ELAVIL     clonazePAM 1 MG  tablet  Commonly known as:  KLONOPIN     FLUoxetine 20 MG capsule  Commonly known as:  PROZAC     lithium carbonate 150 MG capsule      TAKE these medications      Indication   gabapentin 400 MG capsule  Commonly known as:  NEURONTIN  Take 2 capsules (800 mg total) by mouth 3 (three) times daily. For agitation   Indication:  Agitation     hydrOXYzine 25 MG tablet  Commonly known as:  ATARAX/VISTARIL  Take 1 tablet (25 mg) four times daily as needed: For anxiety   Indication:  Anxiety Neurosis     levothyroxine 150 MCG tablet  Commonly known as:  SYNTHROID, LEVOTHROID  Take 1 tablet (150 mcg total) by mouth daily before breakfast. For low thyroid function   Indication:  Underactive Thyroid     nicotine 21 mg/24hr patch  Commonly known as:  NICODERM CQ - dosed in mg/24 hours  Place 1 patch (21 mg total) onto the skin daily. For smoking cessation   Indication:  Nicotine Addiction     omeprazole 40 MG capsule  Commonly known as:  PRILOSEC  Take 1 capsule (40 mg total) by mouth daily. For acid reflux   Indication:  Gastroesophageal Reflux Disease     sertraline 100 MG tablet  Commonly known as:  ZOLOFT  Take 1 tablet (100 mg total) by mouth daily. For depression   Indication:  Major Depressive Disorder       Follow-up Information    Follow up with [facility] .   Why:  Please call office on Monday to move up doctor appt that is currently scheduled in March.     Contact information:   [address] [place], [place] [address] Phone: [phone] Fax: [phone]  Follow up with [facility].   Why:  Please follow-up with Juliann Pulse at discharge regarding admission status. TB test results and updated medication list faxed on 10/07/2015.    Contact information:   ATTN: [address] [place], [place] [address] Phone: [phone] Fax: [phone]       Follow up with [facility]  On 10/13/2015.   Why:  Appt on this  date at 9:00AM with Ms. Young for counseling. Please call within 48 hours of appt if you must cancel or reschedule. Thank you.    Contact information:   [place], [place] [address] Phone: [phone] Fax: [phone]     Follow-up recommendations:  Activity:  As tolerated Diet: As recommended by your primary care doctor. Keep all scheduled follow-up appointments as recommended.   Comments: Take all your medications as prescribed by your mental healthcare provider. Report any adverse effects and or reactions from your medicines to your outpatient provider promptly. Patient is instructed and cautioned to not engage in alcohol and or illegal drug use while on prescription medicines. In the event of worsening symptoms, patient is instructed to call the crisis hotline, 911 and or go to the nearest ED for appropriate evaluation and treatment of symptoms. Follow-up with your primary care provider for your other medical issues, concerns and or health care needs.  Total Discharge Time: Greater than 30 minutes  Signed: Encarnacion Slates, PMHNP, FNP-BC 10/07/2015, 11:23 AM  I personally assessed the patient and formulated the plan Geralyn Flash A. Sabra Heck, M.D.

## 2015-10-07 NOTE — Progress Notes (Signed)
  [facility] Adult Case Management Discharge Plan :  Will you be returning to the same living situation after discharge:  Yes,  home with sponsor until accepted to [facility] house At discharge, do you have transportation home?: Yes,  sponsor Do you have the ability to pay for your medications: mental health  Release of information consent forms completed and submitted to medical records by CSW.  Patient to Follow up at: Follow-up Information    Follow up with [facility] .   Why:  Please call office on Monday to move up doctor appt that is currently scheduled in March.     Contact information:   [place], [place] [address] Phone: [phone] Fax: [phone]      Follow up with [facility].   Why:  Please follow-up with Juliann Pulse at discharge regarding admission status. TB test results and updated medication list faxed on 10/07/2015.    Contact information:   ATTN: [address] [place], [place] [address] Phone: [phone] Fax: [phone]       Follow up with [facility]  On 10/13/2015.   Why:  Appt on this date at 9:00AM with Ms. Young for counseling. Please call within 48 hours of appt if you must cancel or reschedule. Thank you.    Contact information:   [address] [place], [place] [address] Phone: [phone] Fax: [phone]      Next level of care provider has access to [place] and Suicide Prevention discussed: Yes,  SPE completed with pt, as she refused to consent to family contact. SPI pamphlet and mobile crisis information provided to pt and she was encouraged to share information with support network.   Have you used any form of tobacco in the last 30 days? (Cigarettes, Smokeless Tobacco, Cigars, and/or Pipes): Yes  Has patient been referred to the Quitline?: Patient refused referral  Patient has been referred for addiction treatment: Yes-see above.   Smart, Jaysha Lasure  LCSW 10/07/2015, 11:15 AM

## 2015-10-08 ENCOUNTER — Inpatient Hospital Stay (HOSPITAL_COMMUNITY)
Admission: EM | Admit: 2015-10-08 | Discharge: 2015-10-12 | DRG: 101 | Disposition: A | Discharge status: Home / Self Care | Payer: Self-pay | Attending: Internal Medicine | Admitting: Internal Medicine

## 2015-10-08 ENCOUNTER — Emergency Department (HOSPITAL_COMMUNITY)
Admission: EM | Admit: 2015-10-08 | Discharge: 2015-10-08 | Disposition: A | Discharge status: Home / Self Care | Payer: Self-pay | Attending: Emergency Medicine | Admitting: Emergency Medicine

## 2015-10-08 ENCOUNTER — Encounter (HOSPITAL_COMMUNITY): Payer: Self-pay | Admitting: Emergency Medicine

## 2015-10-08 DIAGNOSIS — F1310 Sedative, hypnotic or anxiolytic abuse, uncomplicated: Secondary | ICD-10-CM | POA: Insufficient documentation

## 2015-10-08 DIAGNOSIS — F4310 Post-traumatic stress disorder, unspecified: Secondary | ICD-10-CM | POA: Diagnosis present

## 2015-10-08 DIAGNOSIS — F17210 Nicotine dependence, cigarettes, uncomplicated: Secondary | ICD-10-CM | POA: Diagnosis present

## 2015-10-08 DIAGNOSIS — Z8711 Personal history of peptic ulcer disease: Secondary | ICD-10-CM | POA: Insufficient documentation

## 2015-10-08 DIAGNOSIS — Z79899 Other long term (current) drug therapy: Secondary | ICD-10-CM | POA: Insufficient documentation

## 2015-10-08 DIAGNOSIS — F1020 Alcohol dependence, uncomplicated: Secondary | ICD-10-CM

## 2015-10-08 DIAGNOSIS — R569 Unspecified convulsions: Secondary | ICD-10-CM

## 2015-10-08 DIAGNOSIS — F1410 Cocaine abuse, uncomplicated: Secondary | ICD-10-CM | POA: Diagnosis present

## 2015-10-08 DIAGNOSIS — F329 Major depressive disorder, single episode, unspecified: Secondary | ICD-10-CM | POA: Diagnosis present

## 2015-10-08 DIAGNOSIS — F1910 Other psychoactive substance abuse, uncomplicated: Secondary | ICD-10-CM | POA: Diagnosis present

## 2015-10-08 DIAGNOSIS — F13239 Sedative, hypnotic or anxiolytic dependence with withdrawal, unspecified: Secondary | ICD-10-CM | POA: Diagnosis present

## 2015-10-08 DIAGNOSIS — Z862 Personal history of diseases of the blood and blood-forming organs and certain disorders involving the immune mechanism: Secondary | ICD-10-CM | POA: Insufficient documentation

## 2015-10-08 DIAGNOSIS — F419 Anxiety disorder, unspecified: Secondary | ICD-10-CM | POA: Insufficient documentation

## 2015-10-08 DIAGNOSIS — F10239 Alcohol dependence with withdrawal, unspecified: Secondary | ICD-10-CM | POA: Diagnosis present

## 2015-10-08 DIAGNOSIS — E039 Hypothyroidism, unspecified: Secondary | ICD-10-CM | POA: Insufficient documentation

## 2015-10-08 DIAGNOSIS — Z9884 Bariatric surgery status: Secondary | ICD-10-CM

## 2015-10-08 DIAGNOSIS — R258 Other abnormal involuntary movements: Secondary | ICD-10-CM | POA: Insufficient documentation

## 2015-10-08 DIAGNOSIS — Z820 Family history of epilepsy and other diseases of the nervous system: Secondary | ICD-10-CM

## 2015-10-08 DIAGNOSIS — F10939 Alcohol use, unspecified with withdrawal, unspecified: Secondary | ICD-10-CM | POA: Insufficient documentation

## 2015-10-08 DIAGNOSIS — E669 Obesity, unspecified: Secondary | ICD-10-CM | POA: Diagnosis present

## 2015-10-08 DIAGNOSIS — F13939 Sedative, hypnotic or anxiolytic use, unspecified with withdrawal, unspecified: Secondary | ICD-10-CM

## 2015-10-08 DIAGNOSIS — Z811 Family history of alcohol abuse and dependence: Secondary | ICD-10-CM

## 2015-10-08 DIAGNOSIS — R300 Dysuria: Secondary | ICD-10-CM | POA: Diagnosis present

## 2015-10-08 DIAGNOSIS — Z915 Personal history of self-harm: Secondary | ICD-10-CM | POA: Insufficient documentation

## 2015-10-08 LAB — COMPREHENSIVE METABOLIC PANEL
ALK PHOS: 88 U/L (ref 38–126)
ALT: 81 U/L — ABNORMAL HIGH (ref 14–54)
ANION GAP: 6 (ref 5–15)
AST: 45 U/L — ABNORMAL HIGH (ref 15–41)
Albumin: 3.9 g/dL (ref 3.5–5.0)
BILIRUBIN TOTAL: 0.2 mg/dL — AB (ref 0.3–1.2)
BUN: 17 mg/dL (ref 6–20)
CALCIUM: 8.8 mg/dL — AB (ref 8.9–10.3)
CO2: 27 mmol/L (ref 22–32)
Chloride: 106 mmol/L (ref 101–111)
Creatinine, Ser: 0.82 mg/dL (ref 0.44–1.00)
Glucose, Bld: 116 mg/dL — ABNORMAL HIGH (ref 65–99)
Potassium: 3.7 mmol/L (ref 3.5–5.1)
SODIUM: 139 mmol/L (ref 135–145)
TOTAL PROTEIN: 6.9 g/dL (ref 6.5–8.1)

## 2015-10-08 LAB — CBC WITH DIFFERENTIAL/PLATELET
Basophils Absolute: 0 10*3/uL (ref 0.0–0.1)
Basophils Relative: 0 %
EOS ABS: 0.6 10*3/uL (ref 0.0–0.7)
Eosinophils Relative: 8 %
HCT: 31.5 % — ABNORMAL LOW (ref 36.0–46.0)
HEMOGLOBIN: 10 g/dL — AB (ref 12.0–15.0)
Lymphocytes Relative: 14 %
Lymphs Abs: 1 10*3/uL (ref 0.7–4.0)
MCH: 31 pg (ref 26.0–34.0)
MCHC: 31.7 g/dL (ref 30.0–36.0)
MCV: 97.5 fL (ref 78.0–100.0)
MONOS PCT: 6 %
Monocytes Absolute: 0.5 10*3/uL (ref 0.1–1.0)
NEUTROS PCT: 72 %
Neutro Abs: 5.3 10*3/uL (ref 1.7–7.7)
Platelets: 258 10*3/uL (ref 150–400)
RBC: 3.23 MIL/uL — ABNORMAL LOW (ref 3.87–5.11)
RDW: 17.7 % — ABNORMAL HIGH (ref 11.5–15.5)
WBC: 7.4 10*3/uL (ref 4.0–10.5)

## 2015-10-08 LAB — RAPID URINE DRUG SCREEN, HOSP PERFORMED
Amphetamines: NOT DETECTED
Barbiturates: NOT DETECTED
Benzodiazepines: POSITIVE — AB
COCAINE: NOT DETECTED
OPIATES: NOT DETECTED
Tetrahydrocannabinol: NOT DETECTED

## 2015-10-08 LAB — KETONES, URINE: Ketones, ur: NEGATIVE mg/dL

## 2015-10-08 LAB — ETHANOL

## 2015-10-08 LAB — TSH: TSH: 8.917 u[IU]/mL — AB (ref 0.350–4.500)

## 2015-10-08 MED ORDER — THIAMINE HCL 100 MG/ML IJ SOLN: 100.0000 mg | Freq: Every day | INTRAMUSCULAR | Status: DC

## 2015-10-08 MED ORDER — FOLIC ACID 1 MG PO TABS
1.0000 mg | ORAL_TABLET | Freq: Every day | ORAL | Status: DC
  Administered 2015-10-09 – 2015-10-12 (×4): 1 mg via ORAL
  Filled 2015-10-08 (×5): qty 1

## 2015-10-08 MED ORDER — VITAMIN B-1 100 MG PO TABS
100.0000 mg | ORAL_TABLET | Freq: Every day | ORAL | Status: DC
Start: 2015-10-09 — End: 2015-10-12
  Administered 2015-10-09 – 2015-10-12 (×4): 100 mg via ORAL
  Filled 2015-10-08 (×5): qty 1

## 2015-10-08 MED ORDER — TOPIRAMATE 25 MG PO TABS: 25.0000 mg | ORAL_TABLET | Freq: Two times a day (BID) | ORAL | Status: DC

## 2015-10-08 MED ORDER — ONDANSETRON HCL 4 MG/2ML IJ SOLN: 4.0000 mg | Freq: Four times a day (QID) | INTRAMUSCULAR | Status: DC | PRN

## 2015-10-08 MED ORDER — ONDANSETRON HCL 4 MG PO TABS: 4.0000 mg | ORAL_TABLET | Freq: Four times a day (QID) | ORAL | Status: DC | PRN

## 2015-10-08 MED ORDER — LORAZEPAM 1 MG PO TABS
1.0000 mg | ORAL_TABLET | Freq: Four times a day (QID) | ORAL | Status: AC | PRN
  Administered 2015-10-09 – 2015-10-11 (×8): 1 mg via ORAL
  Filled 2015-10-08 (×8): qty 1

## 2015-10-08 MED ORDER — THIAMINE HCL 100 MG/ML IJ SOLN
INTRAMUSCULAR | Status: AC
  Filled 2015-10-08: qty 2

## 2015-10-08 MED ORDER — SODIUM CHLORIDE 0.9 % IJ SOLN
3.0000 mL | Freq: Two times a day (BID) | INTRAMUSCULAR | Status: DC
  Administered 2015-10-08 – 2015-10-11 (×5): 3 mL via INTRAVENOUS

## 2015-10-08 MED ORDER — ADULT MULTIVITAMIN W/MINERALS CH: 1.0000 | ORAL_TABLET | Freq: Every day | ORAL | Status: DC

## 2015-10-08 MED ORDER — FOLIC ACID 5 MG/ML IJ SOLN
INTRAMUSCULAR | Status: AC
  Filled 2015-10-08: qty 0.2

## 2015-10-08 MED ORDER — LORAZEPAM 2 MG/ML IJ SOLN
1.0000 mg | Freq: Four times a day (QID) | INTRAMUSCULAR | Status: AC | PRN
  Administered 2015-10-09: 1 mg via INTRAVENOUS
  Filled 2015-10-08: qty 1

## 2015-10-08 MED ORDER — IBUPROFEN 400 MG PO TABS
400.0000 mg | ORAL_TABLET | Freq: Once | ORAL | Status: AC
  Administered 2015-10-08: 400 mg via ORAL
  Filled 2015-10-08: qty 1

## 2015-10-08 MED ORDER — THIAMINE HCL 100 MG/ML IJ SOLN
Freq: Once | INTRAVENOUS | Status: AC
  Administered 2015-10-08: 22:00:00 via INTRAVENOUS
  Filled 2015-10-08: qty 1000

## 2015-10-08 MED ORDER — PHENAZOPYRIDINE HCL 100 MG PO TABS
100.0000 mg | ORAL_TABLET | Freq: Three times a day (TID) | ORAL | Status: DC
  Administered 2015-10-08 – 2015-10-09 (×3): 100 mg via ORAL
  Filled 2015-10-08 (×3): qty 1

## 2015-10-08 MED ORDER — M.V.I. ADULT IV INJ
INJECTION | INTRAVENOUS | Status: AC
  Filled 2015-10-08: qty 10

## 2015-10-08 NOTE — ED Notes (Signed)
Per halfway staff pt had seizure after leaving here earlier. Pt was alert and oriented when ems arrived.

## 2015-10-08 NOTE — ED Provider Notes (Signed)
CSN: [id number]     Arrival date & time 10/08/15  1813 History   First MD Initiated Contact with Patient 10/08/15 1815     Chief Complaint  Patient presents with  . Seizures     (Consider location/radiation/quality/duration/timing/severity/associated sxs/prior Treatment) HPI   Jacqueline [name] is a 49 y.o. female for evaluation of the seizure. She states that she was unloading her personal items into a halfway house, when she had a seizure. The tendon was with her and states that she shook for about 2 minutes. He fell to the ground, but did not injure herself. The seizure occurred about 6 hours after leaving the ED, earlier today. She had been evaluated and for a seizure that was felt to be related to alcohol withdrawal, and benzodiazepine withdrawal. [facility]. Last alcohol ingestion was about 5 days ago. She denies fever, chills, nausea, vomiting, weakness or dizziness. There are no luminal modifying factors.  Past Medical History  Diagnosis Date  . Depression   . Peptic ulcer   . Alcohol abuse   . Anemia   . Benzodiazepine abuse   . Thyroid disease   . Alcoholism ([place])   . Narcotic abuse   . Back pain   . Thrombocytopenia ([place]) 06/17/2011  . Hypothyroidism   . Seizures ([place])   . Hypoglycemia   . Suicide attempt ([place])     multiple times  . Anxiety    Past Surgical History  Procedure Laterality Date  . Cholecystectomy    . Abdominal surgery    . Esophagogastroduodenoscopy    . Gastric bypass    . Tubal ligation    . Esophagogastroduodenoscopy N/A 03/23/2013    Procedure: ESOPHAGOGASTRODUODENOSCOPY (EGD);  Surgeon: Ladene Artist, MD;  Location: [facility];  Service: Endoscopy;  Laterality: N/A;   Family History  Problem Relation Age of Onset  . Alcohol abuse Father   . Alcoholism Father   . Cancer Other    Social History  Substance Use Topics  . Smoking status: Current Every Day Smoker    Types: Cigarettes   . Smokeless tobacco: None  . Alcohol Use: Yes     Comment: drink 4 liters a day. 1 case/day drinks Listerine/ 21 days sober today 03/12/15, per pt   OB History    Gravida Para Term Preterm AB TAB SAB Ectopic Multiple Living   3 3 0 3 0 0 0 0       Review of Systems  All other systems reviewed and are negative.     Allergies  Nsaids  Home Medications   Prior to Admission medications   Medication Sig Start Date End Date Taking? Authorizing Provider  gabapentin (NEURONTIN) 400 MG capsule Take 2 capsules (800 mg total) by mouth 3 (three) times daily. For agitation 10/07/15  Yes Encarnacion Slates, NP  hydrOXYzine (ATARAX/VISTARIL) 25 MG tablet Take 1 tablet (25 mg) four times daily as needed: For anxiety 10/07/15  Yes Encarnacion Slates, NP  levothyroxine (SYNTHROID, LEVOTHROID) 150 MCG tablet Take 1 tablet (150 mcg total) by mouth daily before breakfast. For low thyroid function 10/07/15  Yes Encarnacion Slates, NP  nicotine (NICODERM CQ - DOSED IN MG/24 HOURS) 21 mg/24hr patch Place 1 patch (21 mg total) onto the skin daily. For smoking cessation 10/07/15  Yes Encarnacion Slates, NP  omeprazole (PRILOSEC) 40 MG capsule Take 1 capsule (40 mg total) by mouth daily. For acid reflux 10/07/15  Yes Loleta Dicker  [place], NP  sertraline (ZOLOFT) 100 MG tablet Take 1 tablet (100 mg total) by mouth daily. For depression 10/07/15  Yes Encarnacion Slates, NP  topiramate (TOPAMAX) 25 MG tablet Take 1 tablet (25 mg total) by mouth 2 (two) times daily. 10/08/15  Yes Jason Mesner, MD   BP 116/71 mmHg  Pulse 66  Temp(Src) 98 F (36.7 C) (Oral)  Resp 19  SpO2 95%  LMP 09/09/2015 Physical Exam  Constitutional: She is oriented to person, place, and time. She appears well-developed and well-nourished.  HENT:  Head: Normocephalic and atraumatic.  Right Ear: External ear normal.  Left Ear: External ear normal.  No tongue abrasion or laceration.  Eyes: Conjunctivae and EOM are normal. Pupils are equal, round, and reactive to  light.  Neck: Normal range of motion and phonation normal. Neck supple.  Cardiovascular: Normal rate, regular rhythm and normal heart sounds.   Pulmonary/Chest: Effort normal and breath sounds normal. She exhibits no bony tenderness.  Abdominal: Soft. There is no tenderness.  Musculoskeletal: Normal range of motion.  Neurological: She is alert and oriented to person, place, and time. No cranial nerve deficit or sensory deficit. She exhibits normal muscle tone. Coordination normal.  No dysarthria and aphasia or nystagmus  Skin: Skin is warm, dry and intact.  Psychiatric: She has a normal mood and affect. Her behavior is normal. Judgment and thought content normal.  Nursing note and vitals reviewed.   ED Course  Procedures (including critical care time)  Medications  ibuprofen (ADVIL,MOTRIN) tablet 400 mg (400 mg Oral Given 10/08/15 1833)    Patient Vitals for the past 24 hrs:  BP Temp Temp src Pulse Resp SpO2  10/08/15 2000 116/71 mmHg - - 66 19 95 %  10/08/15 1930 110/73 mmHg - - 62 18 95 %  10/08/15 1830 112/78 mmHg - - 84 26 98 %  10/08/15 1821 133/72 mmHg 98 F (36.7 C) Oral 85 14 98 %     Results for orders placed or performed during the hospital encounter of 10/08/15  CBC with Differential  Result Value Ref Range   WBC 7.4 4.0 - 10.5 K/uL   RBC 3.23 (L) 3.87 - 5.11 MIL/uL   Hemoglobin 10.0 (L) 12.0 - 15.0 g/dL   HCT 31.5 (L) 36.0 - 46.0 %   MCV 97.5 78.0 - 100.0 fL   MCH 31.0 26.0 - 34.0 pg   MCHC 31.7 30.0 - 36.0 g/dL   RDW 17.7 (H) 11.5 - 15.5 %   Platelets 258 150 - 400 K/uL   Neutrophils Relative % 72 %   Neutro Abs 5.3 1.7 - 7.7 K/uL   Lymphocytes Relative 14 %   Lymphs Abs 1.0 0.7 - 4.0 K/uL   Monocytes Relative 6 %   Monocytes Absolute 0.5 0.1 - 1.0 K/uL   Eosinophils Relative 8 %   Eosinophils Absolute 0.6 0.0 - 0.7 K/uL   Basophils Relative 0 %   Basophils Absolute 0.0 0.0 - 0.1 K/uL  Comprehensive metabolic panel  Result Value Ref Range   Sodium  139 135 - 145 mmol/L   Potassium 3.7 3.5 - 5.1 mmol/L   Chloride 106 101 - 111 mmol/L   CO2 27 22 - 32 mmol/L   Glucose, Bld 116 (H) 65 - 99 mg/dL   BUN 17 6 - 20 mg/dL   Creatinine, Ser 0.82 0.44 - 1.00 mg/dL   Calcium 8.8 (L) 8.9 - 10.3 mg/dL   Total Protein 6.9 6.5 - 8.1 g/dL  Albumin 3.9 3.5 - 5.0 g/dL   AST 45 (H) 15 - 41 U/L   ALT 81 (H) 14 - 54 U/L   Alkaline Phosphatase 88 38 - 126 U/L   Total Bilirubin 0.2 (L) 0.3 - 1.2 mg/dL   GFR calc non Af Amer >60 >60 mL/min   GFR calc Af Amer >60 >60 mL/min   Anion gap 6 5 - 15  Ethanol  Result Value Ref Range   Alcohol, Ethyl (B) <5 <5 mg/dL  Urine rapid drug screen (hosp performed)  Result Value Ref Range   Opiates NONE DETECTED NONE DETECTED   Cocaine NONE DETECTED NONE DETECTED   Benzodiazepines POSITIVE (A) NONE DETECTED   Amphetamines NONE DETECTED NONE DETECTED   Tetrahydrocannabinol NONE DETECTED NONE DETECTED   Barbiturates NONE DETECTED NONE DETECTED  Ketones, urine  Result Value Ref Range   Ketones, ur NEGATIVE NEGATIVE mg/dL       8:18 PM Reevaluation with update and discussion. After initial assessment and treatment, an updated evaluation reveals she is comfortable now. States headache and chest discomfort have improved. No seizures in the ED. Repeat vital signs are stable. Findings discussed with the patient, she agrees with hospital admission for observation of seizures. [place] Skalicky L   8:22 PM-Consult complete with Dr. Shanon Brow. Patient case explained and discussed. She agrees to admit patient for further evaluation and treatment. Call ended at 20:30  Labs Review Labs Reviewed - No data to display  Imaging Review No results found. I have personally reviewed and evaluated these images and lab results as part of my medical decision-making.   EKG Interpretation   Date/Time:  Saturday October 08 2015 18:20:54 EST Ventricular Rate:  78 PR Interval:  130 QRS Duration: 102 QT Interval:  433 QTC  Calculation: 493 R Axis:   71 Text Interpretation:  Sinus rhythm Abnormal R-wave progression, early  transition Borderline prolonged QT interval since last tracing no  significant change Confirmed by Eulis Foster  MD, Meylin Stenzel [id number]) on 10/08/2015  6:58:07 PM      MDM   Final diagnoses:  Seizure ([place])  Alcoholism ([place])  Benzodiazepine withdrawal, with unspecified complication ([place])    Recurrent seizure, 2 today. Suspect related to the combination of alcohol abuse, and benzodiazepine withdrawal. Labs from earlier today are normal. No evidence for hemodynamic instability. I doubt that she has epilepsy.  Nursing Notes Reviewed/ Care Coordinated, and agree without changes. Applicable Imaging Reviewed.  Interpretation of Laboratory Data incorporated into ED treatment  Plan: Admit for observation and consider neurology consultation    Daleen Bo, MD 10/08/15 2030

## 2015-10-08 NOTE — H&P (Signed)
PCP:   No PCP Per Patient   Chief Complaint:  seizure  HPI: 49 yo female h/o PTSD, alcohol abuse, benzo abuse, cocaine abuse, depression and anxiety comes in with second seizure today.  Pt just went to detox and was discharged yesterday.  Her last alcohol drink was 5 days ago, she was drinking over [name] case of beer [name] day.  She was on klonopin 2mg  daily.  First couple of days of detox were rough, with anxiety, tremors and was on ativan which helped.  By yesterday morning she was given her last ativan 1mg  pill and sent home to [name].  She reports she has not taken any etoh or other drugs since she left her detox.  This am she was in an [place] meeting when she had [name] witnessed tonic/clonic seizure, she came to the ED was given [name] script for topomax which she did not fill and thought to be due to etoh withdrawal.  Later in the day she was unloading her car at the halfway house when she had another 25min or so tonic/clonic seizure that was witnessed in the yard.  Pt denies any fevers, recent illnesses besides the detoxing.  She feels fine right now.  She is complaining of dysuria for [name] day or so.  She had [name] ua this am that showed nitrites, neg leuk, no wbc.  Pt referred for admission for likely etoh withdrawal seizures.  Review of Systems:  Positive and negative as per HPI otherwise all other systems are negative  Past Medical History: Past Medical History  Diagnosis Date  . Depression   . Peptic ulcer   . Alcohol abuse   . Anemia   . Benzodiazepine abuse   . Thyroid disease   . Alcoholism ([place])   . Narcotic abuse   . Back pain   . Thrombocytopenia ([place]) 06/17/2011  . Hypothyroidism   . Seizures ([place])   . Hypoglycemia   . Suicide attempt ([place])     multiple times  . Anxiety    Past Surgical History  Procedure Laterality Date  . Cholecystectomy    . Abdominal surgery    . Esophagogastroduodenoscopy    . Gastric bypass    . Tubal ligation    . Esophagogastroduodenoscopy N/[name] 03/23/2013     Procedure: ESOPHAGOGASTRODUODENOSCOPY (EGD);  Surgeon: Ladene Artist, MD;  Location: [facility];  Service: Endoscopy;  Laterality: N/[name];    Medications: Prior to Admission medications   Medication Sig Start Date End Date Taking? Authorizing Provider  gabapentin (NEURONTIN) 400 MG capsule Take 2 capsules (800 mg total) by mouth 3 (three) times daily. For agitation 10/07/15  Yes Encarnacion Slates, NP  hydrOXYzine (ATARAX/VISTARIL) 25 MG tablet Take 1 tablet (25 mg) four times daily as needed: For anxiety 10/07/15  Yes Encarnacion Slates, NP  levothyroxine (SYNTHROID, LEVOTHROID) 150 MCG tablet Take 1 tablet (150 mcg total) by mouth daily before breakfast. For low thyroid function 10/07/15  Yes Encarnacion Slates, NP  nicotine (NICODERM CQ - DOSED IN MG/24 HOURS) 21 mg/24hr patch Place 1 patch (21 mg total) onto the skin daily. For smoking cessation 10/07/15  Yes Encarnacion Slates, NP  omeprazole (PRILOSEC) 40 MG capsule Take 1 capsule (40 mg total) by mouth daily. For acid reflux 10/07/15  Yes Encarnacion Slates, NP  sertraline (ZOLOFT) 100 MG tablet Take 1 tablet (100 mg total) by mouth daily. For depression 10/07/15  Yes Encarnacion Slates, NP  topiramate (TOPAMAX) 25 MG  tablet Take 1 tablet (25 mg total) by mouth 2 (two) times daily. 10/08/15  Yes Merrily Pew, MD    Allergies:   Allergies  Allergen Reactions  . Nsaids Other (See Comments)    G.I. Bleed    Social History:  reports that she has been smoking Cigarettes.  She does not have any smokeless tobacco history on file. She reports that she drinks alcohol. She reports that she uses illicit drugs (Oxycodone, Cocaine, and Benzodiazepines).  Family History: Family History  Problem Relation Age of Onset  . Alcohol abuse Father   . Alcoholism Father   . Cancer Other     Physical Exam: Filed Vitals:   10/08/15 2000 10/08/15 2030 10/08/15 2100 10/08/15 2130  BP: 116/71 117/71 133/84 120/63  Pulse: 66 63 62 72  Temp:      TempSrc:      Resp: 19 17  19 19   SpO2: 95% 96% 98% 95%   General appearance: alert, cooperative and no distress Head: Normocephalic, without obvious abnormality, atraumatic Eyes: negative Nose: Nares normal. Septum midline. Mucosa normal. No drainage or sinus tenderness. Neck: no JVD and supple, symmetrical, trachea midline Lungs: clear to auscultation bilaterally Heart: regular rate and rhythm, S1, S2 normal, no murmur, click, rub or gallop Abdomen: soft, non-tender; bowel sounds normal; no masses,  no organomegaly Extremities: extremities normal, atraumatic, no cyanosis or edema Pulses: 2+ and symmetric Skin: Skin color, texture, turgor normal. No rashes or lesions Neurologic: Grossly normal    Labs on Admission:   Recent Labs  10/08/15 1220  NA 139  K 3.7  CL 106  CO2 27  GLUCOSE 116*  BUN 17  CREATININE 0.82  CALCIUM 8.8*    Recent Labs  10/08/15 1220  AST 45*  ALT 81*  ALKPHOS 88  BILITOT 0.2*  PROT 6.9  ALBUMIN 3.9    Recent Labs  10/08/15 1220  WBC 7.4  NEUTROABS 5.3  HGB 10.0*  HCT 31.5*  MCV 97.5  PLT 258    Radiological Exams on Admission: No results found.  Assessment/Plan  49 yo female with seizure x 2 today in setting of recent detox from etoh and benzodiazapine   Principal Problem:   Seizure ([place])-  Likely due to wtihdrawal.  Place back on ciwa protocol.  bananna bag.  Obtain neurology consult.  eeg in the am.  Seizure precautions.  Active Problems:   Polysubstance abuse- noted   Anxiety disorder- noted   Cocaine abuse, episodic- uds neg    PTSD (post-traumatic stress disorder)- noted   Dysuria-  Check ua, give pyridium  obs on tele.  Full code.  Jacqueline [name] 10/08/2015, 10:27 PM

## 2015-10-08 NOTE — ED Notes (Addendum)
Pt had seizure at [facility] this am while attempting to become a resident there. Pt was released from [facility] yesterday for ETOH abuse. Pt had witnessed seizure lasting less than one minute. Pt was eased to floor from chair. Pt not post ictal upon EMS arrival or arrival to ED. Pt denies any ETOH use since prior to [facility] admission. Pupils dialted and reactive. Pt moving all extremities, answering questions and following commands. Pt anxious and tearful.

## 2015-10-08 NOTE — ED Provider Notes (Signed)
CSN: [id number]     Arrival date & time 10/08/15  1139 History  By signing my name below, I, Rayna Sexton, attest that this documentation has been prepared under the direction and in the presence of Merrily Pew, MD. Electronically Signed: Rayna Sexton, ED Scribe. 10/08/2015. 1:07 PM.   Chief Complaint  Patient presents with  . Seizures   The history is provided by the patient. No language interpreter was used.    HPI Comments: Jacqueline [name] is a 49 y.o. female who presents to the Emergency Department by EMS due to a seizure that occurred earlier today. Pt notes she was at an [place] and was told she had a seizure which she says she cannot recount. Pt was released from [facility] yesterday after 4 days for ETOH abuse. She notes she was given ativan and benzodiazepine at [facility] and denies having gone home with an rx for either. Pt further notes having taken klonopin which was prescribed by her psychiatrist. She denies any ETOH for 5 days. She notes a hx of seizures which she says occur during ETOH withdrawals and denies having ever seeing a neurologist in the past. She notes she can take antidepressants but denies being able to take any narcotic medications. Pt denies any other associated symptoms at this time.   Past Medical History  Diagnosis Date  . Depression   . Peptic ulcer   . Alcohol abuse   . Anemia   . Benzodiazepine abuse   . Thyroid disease   . Alcoholism ([place])   . Narcotic abuse   . Back pain   . Thrombocytopenia ([place]) 06/17/2011  . Hypothyroidism   . Seizures ([place])   . Hypoglycemia   . Suicide attempt ([place])     multiple times  . Anxiety    Past Surgical History  Procedure Laterality Date  . Cholecystectomy    . Abdominal surgery    . Esophagogastroduodenoscopy    . Gastric bypass    . Tubal ligation    . Esophagogastroduodenoscopy N/A 03/23/2013    Procedure: ESOPHAGOGASTRODUODENOSCOPY (EGD);  Surgeon: Ladene Artist, MD;  Location: [facility];  Service:  Endoscopy;  Laterality: N/A;   Family History  Problem Relation Age of Onset  . Alcohol abuse Father   . Alcoholism Father   . Cancer Other    Social History  Substance Use Topics  . Smoking status: Current Every Day Smoker    Types: Cigarettes  . Smokeless tobacco: None  . Alcohol Use: Yes     Comment: drink 4 liters a day. 1 case/day drinks Listerine/ 21 days sober today 03/12/15, per pt   OB History    Gravida Para Term Preterm AB TAB SAB Ectopic Multiple Living   3 3 0 3 0 0 0 0       Review of Systems  Neurological: Positive for seizures.  All other systems reviewed and are negative.   Allergies  Nsaids  Home Medications   Prior to Admission medications   Medication Sig Start Date End Date Taking? Authorizing Provider  gabapentin (NEURONTIN) 400 MG capsule Take 2 capsules (800 mg total) by mouth 3 (three) times daily. For agitation 10/07/15  Yes Encarnacion Slates, NP  hydrOXYzine (ATARAX/VISTARIL) 25 MG tablet Take 1 tablet (25 mg) four times daily as needed: For anxiety 10/07/15  Yes Encarnacion Slates, NP  levothyroxine (SYNTHROID, LEVOTHROID) 150 MCG tablet Take 1 tablet (150 mcg total) by mouth daily before breakfast. For low thyroid function 10/07/15  Yes Encarnacion Slates, NP  nicotine (NICODERM CQ - DOSED IN MG/24 HOURS) 21 mg/24hr patch Place 1 patch (21 mg total) onto the skin daily. For smoking cessation 10/07/15  Yes Encarnacion Slates, NP  omeprazole (PRILOSEC) 40 MG capsule Take 1 capsule (40 mg total) by mouth daily. For acid reflux 10/07/15  Yes Encarnacion Slates, NP  sertraline (ZOLOFT) 100 MG tablet Take 1 tablet (100 mg total) by mouth daily. For depression 10/07/15  Yes Encarnacion Slates, NP  topiramate (TOPAMAX) 25 MG tablet Take 1 tablet (25 mg total) by mouth 2 (two) times daily. 10/08/15   Merrily Pew, MD   BP 135/83 mmHg  Pulse 81  Temp(Src) 98.2 F (36.8 C) (Oral)  Resp 18  Ht 5\' 9"  (1.753 m)  Wt 240 lb (108.863 kg)  BMI 35.43 kg/m2  SpO2 94%  LMP 09/09/2015     Physical Exam  Constitutional: She is oriented to person, place, and time. She appears well-developed.  HENT:  Head: Normocephalic and atraumatic.  Mouth/Throat: No oropharyngeal exudate.  Eyes: EOM are normal. Pupils are equal, round, and reactive to light.  Bilateral dilated pupils  Neck: Normal range of motion. No tracheal deviation present.  Cardiovascular: Normal rate, regular rhythm, normal heart sounds and intact distal pulses.  Exam reveals no gallop and no friction rub.   No murmur heard. Pulmonary/Chest: Effort normal and breath sounds normal. No respiratory distress. She has no wheezes. She has no rales.  Abdominal: Soft. There is no tenderness.  Musculoskeletal: Normal range of motion.  Neurological: She is alert and oriented to person, place, and time. No cranial nerve deficit. She exhibits normal muscle tone. Coordination normal.  Skin: Skin is warm and dry. She is not diaphoretic.  Psychiatric: She has a normal mood and affect. Her behavior is normal.  Nursing note and vitals reviewed.   ED Course  Procedures  DIAGNOSTIC STUDIES: Oxygen Saturation is 94% on RA, adequate by my interpretation.    COORDINATION OF CARE: 1:06 PM Discussed next steps with pt and he agreed to the plan.   Labs Review Labs Reviewed  CBC WITH DIFFERENTIAL/PLATELET - Abnormal; Notable for the following:    RBC 3.23 (*)    Hemoglobin 10.0 (*)    HCT 31.5 (*)    RDW 17.7 (*)    All other components within normal limits  COMPREHENSIVE METABOLIC PANEL - Abnormal; Notable for the following:    Glucose, Bld 116 (*)    Calcium 8.8 (*)    AST 45 (*)    ALT 81 (*)    Total Bilirubin 0.2 (*)    All other components within normal limits  URINE RAPID DRUG SCREEN, HOSP PERFORMED - Abnormal; Notable for the following:    Benzodiazepines POSITIVE (*)    All other components within normal limits  ETHANOL  KETONES, URINE    Imaging Review No results found. I have personally reviewed and  evaluated these images and lab results as part of my medical decision-making.   EKG Interpretation None      MDM   Final diagnoses:  Seizure-like activity ([place])    Seizure like activity witnessed by AA group, likely related to coming off of alcohol and long standing benzodiazepines. State she has a h/o seizures as well but not on meds. Labs ok, no need for imaging at this time. Unsure if she has epilepsy right now, will need neurology follow up. Will rx topamax (cheapest anti epileptic I could think of) for  a month until she can get follow up.    I personally performed the services described in this documentation, which was scribed in my presence. The recorded information has been reviewed and is accurate.    Merrily Pew, MD 10/08/15 [phone]

## 2015-10-09 LAB — BASIC METABOLIC PANEL
Anion gap: 7 (ref 5–15)
BUN: 12 mg/dL (ref 6–20)
CALCIUM: 8.3 mg/dL — AB (ref 8.9–10.3)
CO2: 26 mmol/L (ref 22–32)
CREATININE: 0.67 mg/dL (ref 0.44–1.00)
Chloride: 105 mmol/L (ref 101–111)
GFR calc Af Amer: >60 mL/min (ref >60)
GLUCOSE: 103 mg/dL — AB (ref 65–99)
POTASSIUM: 3.6 mmol/L (ref 3.5–5.1)
SODIUM: 138 mmol/L (ref 135–145)

## 2015-10-09 LAB — CBC
HEMATOCRIT: 31.7 % — AB (ref 36.0–46.0)
Hemoglobin: 10.3 g/dL — ABNORMAL LOW (ref 12.0–15.0)
MCH: 31.6 pg (ref 26.0–34.0)
MCHC: 32.5 g/dL (ref 30.0–36.0)
MCV: 97.2 fL (ref 78.0–100.0)
PLATELETS: 258 10*3/uL (ref 150–400)
RBC: 3.26 MIL/uL — ABNORMAL LOW (ref 3.87–5.11)
RDW: 18 % — AB (ref 11.5–15.5)
WBC: 6.3 10*3/uL (ref 4.0–10.5)

## 2015-10-09 LAB — URINALYSIS, ROUTINE W REFLEX MICROSCOPIC
BILIRUBIN URINE: NEGATIVE
Glucose, UA: NEGATIVE mg/dL
HGB URINE DIPSTICK: NEGATIVE
Leukocytes, UA: NEGATIVE
NITRITE: NEGATIVE
Protein, ur: NEGATIVE mg/dL
SPECIFIC GRAVITY, URINE: 1.02 (ref 1.005–1.030)
pH: 6.5 (ref 5.0–8.0)

## 2015-10-09 LAB — T4, FREE: Free T4: 0.65 ng/dL (ref 0.61–1.12)

## 2015-10-09 MED ORDER — LORAZEPAM 2 MG/ML IJ SOLN: 1.0000 mg | Freq: Four times a day (QID) | INTRAMUSCULAR | Status: DC | PRN

## 2015-10-09 MED ORDER — TOPIRAMATE 25 MG PO TABS
25.0000 mg | ORAL_TABLET | Freq: Two times a day (BID) | ORAL | Status: DC
  Administered 2015-10-09 – 2015-10-12 (×7): 25 mg via ORAL
  Filled 2015-10-09 (×12): qty 1

## 2015-10-09 MED ORDER — SERTRALINE HCL 50 MG PO TABS
100.0000 mg | ORAL_TABLET | Freq: Every day | ORAL | Status: DC
  Administered 2015-10-09 – 2015-10-12 (×4): 100 mg via ORAL
  Filled 2015-10-09 (×5): qty 2

## 2015-10-09 MED ORDER — GABAPENTIN 400 MG PO CAPS
800.0000 mg | ORAL_CAPSULE | Freq: Three times a day (TID) | ORAL | Status: DC
  Administered 2015-10-09 – 2015-10-12 (×10): 800 mg via ORAL
  Filled 2015-10-09 (×11): qty 2

## 2015-10-09 MED ORDER — ACETAMINOPHEN 325 MG PO TABS
650.0000 mg | ORAL_TABLET | Freq: Four times a day (QID) | ORAL | Status: DC | PRN
Start: 2015-10-09 — End: 2015-10-12
  Administered 2015-10-10 – 2015-10-12 (×3): 650 mg via ORAL
  Filled 2015-10-09 (×4): qty 2

## 2015-10-09 MED ORDER — PANTOPRAZOLE SODIUM 40 MG PO TBEC
40.0000 mg | DELAYED_RELEASE_TABLET | Freq: Every day | ORAL | Status: DC
  Administered 2015-10-09 – 2015-10-12 (×4): 40 mg via ORAL
  Filled 2015-10-09 (×5): qty 1

## 2015-10-09 MED ORDER — LORAZEPAM 1 MG PO TABS
1.0000 mg | ORAL_TABLET | Freq: Four times a day (QID) | ORAL | Status: DC | PRN
Start: 2015-10-09 — End: 2015-10-09

## 2015-10-09 MED ORDER — ADULT MULTIVITAMIN W/MINERALS CH
1.0000 | ORAL_TABLET | Freq: Every day | ORAL | Status: DC
  Administered 2015-10-09: 1 via ORAL
  Filled 2015-10-09 (×4): qty 1

## 2015-10-09 MED ORDER — NICOTINE 21 MG/24HR TD PT24
21.0000 mg | MEDICATED_PATCH | Freq: Every day | TRANSDERMAL | Status: DC
  Administered 2015-10-09 – 2015-10-11 (×3): 21 mg via TRANSDERMAL
  Filled 2015-10-09 (×5): qty 1

## 2015-10-09 MED ORDER — HYDROXYZINE HCL 25 MG PO TABS
25.0000 mg | ORAL_TABLET | Freq: Three times a day (TID) | ORAL | Status: DC | PRN
  Administered 2015-10-12: 25 mg via ORAL
  Filled 2015-10-09: qty 1

## 2015-10-09 MED ORDER — LEVOTHYROXINE SODIUM 75 MCG PO TABS
150.0000 ug | ORAL_TABLET | Freq: Every day | ORAL | Status: DC
  Administered 2015-10-09 – 2015-10-12 (×4): 150 ug via ORAL
  Filled 2015-10-09 (×5): qty 2

## 2015-10-09 NOTE — Progress Notes (Signed)
Utilization Review Completed.Jacqueline [name] T1/10/2015  

## 2015-10-09 NOTE — Progress Notes (Signed)
[facility] PROGRESS NOTE  Jacqueline [name] [id number] DOB: [birth year] DOA: 10/08/2015 PCP: No PCP Per Patient  Assessment/Plan: 1. Seizure-likely from alcohol withdrawal, EEG ordered and currently pending. Neurology has been consulted. Continue Ativan when necessary 2. Alcohol abuse- patient was at [facility] meeting when she had seizure. She has been started on CIWA protocol, no signs and symptoms of alcohol withdrawal. Continue thiamine and folate. 3. Tobacco abuse- start nicotine patch 21 mg daily 4. Dysuria- patient complained of dysuria yesterday UA is negative. Continue Pyridium. 5. Depression-continue Zoloft. Will restart Topamax, gabapentin which patient takes at home. 6. DVT prophylaxis- SCDs  Code Status: Full code Family Communication: No family present at bedside Disposition Plan: Pending workup of seizure   Consultants:  Neurology consulted  Procedures:  EEG ordered  Antibiotics:  None  HPI/Subjective: 50 yo female h/o PTSD, alcohol abuse, benzo abuse, cocaine abuse, depression and anxiety comes in with second seizure today. Pt just went to detox and was discharged yesterday. Her last alcohol drink was 5 days ago, she was drinking over a case of beer a day. Patient had 2 seizures yesterday neck from alcohol withdrawal.  This morning patient feels very anxious, wants nicotine patch. Wants to go home.  Objective: Filed Vitals:   10/08/15 2241 10/09/15 0658  BP: 128/76 132/75  Pulse: 71 65  Temp:  98.2 F (36.8 C)  Resp: 18 18   No intake or output data in the 24 hours ending 10/09/15 0936 Filed Weights   10/08/15 2241  Weight: 112.4 kg (247 lb 12.8 oz)    Exam:   General:  Appears anxious  Cardiovascular: S1-S2 regular  Respiratory: Clear to auscultation bilaterally  Abdomen: Soft, nontender, no organomegaly  Musculoskeletal: No cyanosis/clubbing/edema of the lower extremities   Data Reviewed: Basic Metabolic Panel:  Recent Labs Lab  10/03/15 1809 10/08/15 1220 10/09/15 0656  NA 139 139 138  K 3.6 3.7 3.6  CL 104 106 105  CO2 21* 27 26  GLUCOSE 100* 116* 103*  BUN 13 17 12   CREATININE 0.83 0.82 0.67  CALCIUM 8.0* 8.8* 8.3*   Liver Function Tests:  Recent Labs Lab 10/03/15 1809 10/08/15 1220  AST 191* 45*  ALT 229* 81*  ALKPHOS 112 88  BILITOT 0.3 0.2*  PROT 7.5 6.9  ALBUMIN 4.2 3.9   CBC:  Recent Labs Lab 10/03/15 1809 10/08/15 1220 10/09/15 0656  WBC 8.7 7.4 6.3  NEUTROABS 5.9 5.3  --   HGB 12.1 10.0* 10.3*  HCT 37.8 31.5* 31.7*  MCV 95.7 97.5 97.2  PLT 449* 258 258    Studies: No results found.  Scheduled Meds: . folic acid  1 mg Oral Daily  . gabapentin  800 mg Oral TID  . levothyroxine  150 mcg Oral QAC breakfast  . multivitamin with minerals  1 tablet Oral Daily  . nicotine  21 mg Transdermal Daily  . pantoprazole  40 mg Oral Daily  . phenazopyridine  100 mg Oral TID WC  . sertraline  100 mg Oral Daily  . sodium chloride  3 mL Intravenous Q12H  . thiamine  100 mg Oral Daily   Or  . thiamine  100 mg Intravenous Daily  . topiramate  25 mg Oral BID   Continuous Infusions:   Principal Problem:   Seizure (HCC) Active Problems:   Polysubstance abuse   Anxiety disorder   Cocaine abuse, episodic   PTSD (post-traumatic stress disorder)   Dysuria    Time spent: 25 min  [facility]  [facility] Pager [phone]*. If 7PM-7AM, please contact night-coverage at [email or web address], password [facility] 10/09/2015, 9:36 AM

## 2015-10-10 ENCOUNTER — Inpatient Hospital Stay (HOSPITAL_COMMUNITY)
Admit: 2015-10-10 | Discharge: 2015-10-10 | Disposition: A | Discharge status: Home / Self Care | Payer: Self-pay | Attending: Family Medicine | Admitting: Family Medicine

## 2015-10-10 DIAGNOSIS — E039 Hypothyroidism, unspecified: Secondary | ICD-10-CM

## 2015-10-10 DIAGNOSIS — F329 Major depressive disorder, single episode, unspecified: Secondary | ICD-10-CM

## 2015-10-10 NOTE — Care Management Note (Signed)
Case Management Note  Patient Details  Name: Jacqueline [name] MRN: [id number] Date of Birth: April 16, 1966  Subjective/Objective:                  Pt admitted from halfway house [facility]) with seizures. Pt will return to same home at discharge. Pt is independent with ADL's. Pt receives PCP care at the [facility].  Action/Plan: Pt may need MATCH voucher at discharge. Will continue to follow for discharge planning needs.  Expected Discharge Date:                  Expected Discharge Plan:  Home/Self Care  In-House Referral:  Financial Counselor  Discharge planning Services  CM Consult  Post Acute Care Choice:  NA Choice offered to:  NA  DME Arranged:    DME Agency:     HH Arranged:    HH Agency:     Status of Service:  In process, will continue to follow  Medicare Important Message Given:    Date Medicare IM Given:    Medicare IM give by:    Date Additional Medicare IM Given:    Additional Medicare Important Message give by:     If discussed at [place] of Stay Meetings, dates discussed:    Additional Comments:  Joylene Draft, RN 10/10/2015, 11:23 AM

## 2015-10-10 NOTE — Progress Notes (Signed)
EEG Completed; Results Pending  

## 2015-10-10 NOTE — Consult Note (Signed)
[place] A. Merlene Laughter, MD     [email or web address]          Jacqueline [name] is an 50 y.o. female.   ASSESSMENT/PLAN: Recurrent seizures and lady who has alcoholism. 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 Consequently, the patient will be continued antiseizure medications. She was recently started on Topamax and this to be continued. Dose can be increased later. An EEG and MRI loss of be obtained. She is a precaution was recommended. She is not to drive or operate machinery for at least 6 months. Alcohol cessation is also discussed.   The patient is a 50 year old white female who has a history of alcoholism. She presents with having 2 generalized tonoclonic seizure associated with amnesia and biting of her tongue on the left side. She was in detox a week ago and was released on Thursday. She had 2 seizures on that day. She apparently was given benzodiazepine what she was being detoxed. She reports that she has been drinking alcohol heavily for almost 10 years. She has had a few seizures during this time although not always while she stopped drinking abruptly. She had one about 2 to 3 months ago. Additionally, she's had at least one significant head injury about 2 months ago sustaining significant lacerations requiring sutures. She reports a family history of seizures with her father having seizures although he also had issues with alcoholism. There is no history of prematurity, developmental delay, central nervous system infections or strokes. The patient admits that she did bite the inside of her lip on the left side but no tongue biting. She has returned to baseline. She is amnestic of the events she had. Review of systems otherwise negative.     GENERAL: Pleasant obese female in no acute distress.  HEENT: Supple. Atraumatic normocephalic. Lower lip on the left side shows small bruising. Tongue  is fine.  ABDOMEN: soft  EXTREMITIES: No edema   BACK: Normal.  SKIN: Normal by inspection.    MENTAL STATUS: Alert and oriented. Speech, language and cognition are generally intact. Judgment and insight normal.   CRANIAL NERVES: Pupils are equal, round and reactive to light and accommodation; extra ocular movements are full, there is no significant nystagmus; visual fields are full; upper and lower facial muscles are normal in strength and symmetric, there is no flattening of the nasolabial folds; tongue is midline; uvula is midline; shoulder elevation is normal.  MOTOR: Normal tone, bulk and strength; no pronator drift.  COORDINATION: Left finger to nose is normal, right finger to nose is normal, No rest tremor; no intention tremor; no postural tremor; no bradykinesia.  REFLEXES: Deep tendon reflexes are symmetrical but brisk throughout. Babinski reflexes are flexor bilaterally.   SENSATION: Normal to light touch.  GAIT: Normal   Blood pressure 131/82, pulse 71, temperature 98.1 F (36.7 C), temperature source Oral, resp. rate 18, weight 108.41 kg (239 lb), last menstrual period 09/09/2015, SpO2 96 %.  Past Medical History  Diagnosis Date  . Depression   . Peptic ulcer   . Alcohol abuse   . Anemia   . Benzodiazepine abuse   . Thyroid disease   . Alcoholism ([place])   . Narcotic abuse   . Back pain   . Thrombocytopenia ([place]) 06/17/2011  . Hypothyroidism   . Seizures ([place])   . Hypoglycemia   . Suicide attempt ([place])     multiple times  .  Anxiety     Past Surgical History  Procedure Laterality Date  . Cholecystectomy    . Abdominal surgery    . Esophagogastroduodenoscopy    . Gastric bypass    . Tubal ligation    . Esophagogastroduodenoscopy N/A 03/23/2013    Procedure: ESOPHAGOGASTRODUODENOSCOPY (EGD);  Surgeon: Ladene Artist, MD;  Location: [facility];  Service: Endoscopy;  Laterality: N/A;    Family History  Problem Relation Age of Onset  . Alcohol abuse  Father   . Alcoholism Father   . Cancer Other     Social History:  reports that she has been smoking Cigarettes.  She does not have any smokeless tobacco history on file. She reports that she drinks alcohol. She reports that she uses illicit drugs (Oxycodone, Cocaine, and Benzodiazepines).  Allergies:  Allergies  Allergen Reactions  . Nsaids Other (See Comments)    G.I. Bleed    Medications: Prior to Admission medications   Medication Sig Start Date End Date Taking? Authorizing Provider  gabapentin (NEURONTIN) 400 MG capsule Take 2 capsules (800 mg total) by mouth 3 (three) times daily. For agitation 10/07/15  Yes Encarnacion Slates, NP  hydrOXYzine (ATARAX/VISTARIL) 25 MG tablet Take 1 tablet (25 mg) four times daily as needed: For anxiety 10/07/15  Yes Encarnacion Slates, NP  levothyroxine (SYNTHROID, LEVOTHROID) 150 MCG tablet Take 1 tablet (150 mcg total) by mouth daily before breakfast. For low thyroid function 10/07/15  Yes Encarnacion Slates, NP  nicotine (NICODERM CQ - DOSED IN MG/24 HOURS) 21 mg/24hr patch Place 1 patch (21 mg total) onto the skin daily. For smoking cessation 10/07/15  Yes Encarnacion Slates, NP  omeprazole (PRILOSEC) 40 MG capsule Take 1 capsule (40 mg total) by mouth daily. For acid reflux 10/07/15  Yes Encarnacion Slates, NP  sertraline (ZOLOFT) 100 MG tablet Take 1 tablet (100 mg total) by mouth daily. For depression 10/07/15  Yes Encarnacion Slates, NP  topiramate (TOPAMAX) 25 MG tablet Take 1 tablet (25 mg total) by mouth 2 (two) times daily. 10/08/15  Yes Merrily Pew, MD    Scheduled Meds: . folic acid  1 mg Oral Daily  . gabapentin  800 mg Oral TID  . levothyroxine  150 mcg Oral QAC breakfast  . multivitamin with minerals  1 tablet Oral Daily  . nicotine  21 mg Transdermal Daily  . pantoprazole  40 mg Oral Daily  . sertraline  100 mg Oral Daily  . sodium chloride  3 mL Intravenous Q12H  . thiamine  100 mg Oral Daily   Or  . thiamine  100 mg Intravenous Daily  . topiramate   25 mg Oral BID   Continuous Infusions:  PRN Meds:.acetaminophen, hydrOXYzine, LORazepam **OR** LORazepam, ondansetron **OR** ondansetron (ZOFRAN) IV     Results for orders placed or performed during the hospital encounter of 10/08/15 (from the past 48 hour(s))  Urinalysis, Routine w reflex microscopic (not at [facility])     Status: Abnormal   Collection Time: 10/09/15 12:16 AM  Result Value Ref Range   Color, Urine YELLOW YELLOW   APPearance CLEAR CLEAR   Specific Gravity, Urine 1.020 1.005 - 1.030   pH 6.5 5.0 - 8.0   Glucose, UA NEGATIVE NEGATIVE mg/dL   Hgb urine dipstick NEGATIVE NEGATIVE   Bilirubin Urine NEGATIVE NEGATIVE   Ketones, ur TRACE (A) NEGATIVE mg/dL   Protein, ur NEGATIVE NEGATIVE mg/dL   Nitrite NEGATIVE NEGATIVE   Leukocytes, UA NEGATIVE NEGATIVE  Comment: MICROSCOPIC NOT DONE ON URINES WITH NEGATIVE PROTEIN, BLOOD, LEUKOCYTES, NITRITE, OR GLUCOSE <1000 mg/dL.  Basic metabolic panel     Status: Abnormal   Collection Time: 10/09/15  6:56 AM  Result Value Ref Range   Sodium 138 135 - 145 mmol/L   Potassium 3.6 3.5 - 5.1 mmol/L   Chloride 105 101 - 111 mmol/L   CO2 26 22 - 32 mmol/L   Glucose, Bld 103 (H) 65 - 99 mg/dL   BUN 12 6 - 20 mg/dL   Creatinine, Ser 0.67 0.44 - 1.00 mg/dL   Calcium 8.3 (L) 8.9 - 10.3 mg/dL   GFR calc non Af Amer >60 >60 mL/min   GFR calc Af Amer >60 >60 mL/min    Comment: (NOTE) The eGFR has been calculated using the CKD EPI equation. This calculation has not been validated in all clinical situations. eGFR's persistently <60 mL/min signify possible Chronic Kidney Disease.    Anion gap 7 5 - 15  CBC     Status: Abnormal   Collection Time: 10/09/15  6:56 AM  Result Value Ref Range   WBC 6.3 4.0 - 10.5 K/uL   RBC 3.26 (L) 3.87 - 5.11 MIL/uL   Hemoglobin 10.3 (L) 12.0 - 15.0 g/dL   HCT 31.7 (L) 36.0 - 46.0 %   MCV 97.2 78.0 - 100.0 fL   MCH 31.6 26.0 - 34.0 pg   MCHC 32.5 30.0 - 36.0 g/dL   RDW 18.0 (H) 11.5 - 15.5 %    Platelets 258 150 - 400 K/uL    Studies/Results:     Tabor Denham A. Merlene Laughter, M.D.  Diplomate, Tax adviser of Psychiatry and Neurology ( Neurology). 10/10/2015, 6:10 PM

## 2015-10-10 NOTE — Progress Notes (Signed)
[facility] PROGRESS NOTE  Jacqueline [name] [id number] DOB: 03-24-1966 DOA: 10/08/2015 PCP: No PCP Per Patient  Assessment/Plan: 50 y/o female with PMH of Alcohol, substance abuse, cocaine use, Depression, anxiety, benzodiazepine use , who was recently discharged from rehab presented with seizure   1. Seizure. Likely due to withdrawal seizure. No new episodes while inpatient,. Neuro exam is non focal.  -awaiting EEG. neurology eval. Patient is already on topamax  2. Alcohol abuse history. No s/s of withdrawals. Recently detoxed  3. Depression. Cont home regimen. No s/s of suicidal ideations or plans 4. Hypothyroidism, on levothyroxine     Code Status: full Family Communication: d/w patient. person spoken with, relationship, and if by phone, the number) Disposition Plan: home pend EEG, neuro eval    Consultants:  Neurology   Procedures:  EEG  Antibiotics:  none (indicate start date, and stop date if known)  HPI/Subjective: Alert, no diatress   Objective: Filed Vitals:   10/10/15 0100 10/10/15 0500  BP: 122/60 128/72  Pulse: 62 67  Temp: 98 F (36.7 C) 98.7 F (37.1 C)  Resp: 18 18    Intake/Output Summary (Last 24 hours) at 10/10/15 0932 Last data filed at 10/10/15 0849  Gross per 24 hour  Intake    480 ml  Output      0 ml  Net    480 ml   Filed Weights   10/08/15 2241 10/10/15 0500  Weight: 112.4 kg (247 lb 12.8 oz) 108.41 kg (239 lb)    Exam:   General:  No distress   Cardiovascular: s1,s2 rrr  Respiratory: CTA BL  Abdomen: soft, nt,nd   Musculoskeletal: no leg edema    Data Reviewed: Basic Metabolic Panel:  Recent Labs Lab 10/03/15 1809 10/08/15 1220 10/09/15 0656  NA 139 139 138  K 3.6 3.7 3.6  CL 104 106 105  CO2 21* 27 26  GLUCOSE 100* 116* 103*  BUN 13 17 12   CREATININE 0.83 0.82 0.67  CALCIUM 8.0* 8.8* 8.3*   Liver Function Tests:  Recent Labs Lab 10/03/15 1809 10/08/15 1220  AST 191* 45*  ALT 229* 81*   ALKPHOS 112 88  BILITOT 0.3 0.2*  PROT 7.5 6.9  ALBUMIN 4.2 3.9   No results for input(s): LIPASE, AMYLASE in the last 168 hours. No results for input(s): AMMONIA in the last 168 hours. CBC:  Recent Labs Lab 10/03/15 1809 10/08/15 1220 10/09/15 0656  WBC 8.7 7.4 6.3  NEUTROABS 5.9 5.3  --   HGB 12.1 10.0* 10.3*  HCT 37.8 31.5* 31.7*  MCV 95.7 97.5 97.2  PLT 449* 258 258   Cardiac Enzymes: No results for input(s): CKTOTAL, CKMB, CKMBINDEX, TROPONINI in the last 168 hours. BNP (last 3 results) No results for input(s): BNP in the last 8760 hours.  ProBNP (last 3 results) No results for input(s): PROBNP in the last 8760 hours.  CBG: No results for input(s): GLUCAP in the last 168 hours.  No results found for this or any previous visit (from the past 240 hour(s)).   Studies: No results found.  Scheduled Meds: . folic acid  1 mg Oral Daily  . gabapentin  800 mg Oral TID  . levothyroxine  150 mcg Oral QAC breakfast  . multivitamin with minerals  1 tablet Oral Daily  . nicotine  21 mg Transdermal Daily  . pantoprazole  40 mg Oral Daily  . phenazopyridine  100 mg Oral TID WC  . sertraline  100 mg Oral Daily  .  sodium chloride  3 mL Intravenous Q12H  . thiamine  100 mg Oral Daily   Or  . thiamine  100 mg Intravenous Daily  . topiramate  25 mg Oral BID   Continuous Infusions:   Principal Problem:   Seizure ([place]) Active Problems:   Polysubstance abuse   Anxiety disorder   Cocaine abuse, episodic   PTSD (post-traumatic stress disorder)   Dysuria    Time spent: >35 minutes     Kinnie Feil  [facility] Pager [phone]. If 7PM-7AM, please contact night-coverage at [email or web address], password [facility] 10/10/2015, 9:32 AM  LOS: 1 day

## 2015-10-11 ENCOUNTER — Inpatient Hospital Stay (HOSPITAL_COMMUNITY): Payer: Self-pay

## 2015-10-11 MED ORDER — GADOBENATE DIMEGLUMINE 529 MG/ML IV SOLN
20.0000 mL | Freq: Once | INTRAVENOUS | Status: AC | PRN
  Administered 2015-10-11: 20 mL via INTRAVENOUS

## 2015-10-11 NOTE — Procedures (Signed)
  [place] A. Merlene Laughter, MD     [email or web address]           HISTORY: The patient is a 50 year old female who presents with recurrent seizures. 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  MEDICATIONS: Scheduled Meds: . folic acid  1 mg Oral Daily  . gabapentin  800 mg Oral TID  . levothyroxine  150 mcg Oral QAC breakfast  . multivitamin with minerals  1 tablet Oral Daily  . nicotine  21 mg Transdermal Daily  . pantoprazole  40 mg Oral Daily  . sertraline  100 mg Oral Daily  . sodium chloride  3 mL Intravenous Q12H  . thiamine  100 mg Oral Daily   Or  . thiamine  100 mg Intravenous Daily  . topiramate  25 mg Oral BID   Continuous Infusions:  PRN Meds:.acetaminophen, hydrOXYzine, LORazepam **OR** LORazepam, ondansetron **OR** ondansetron (ZOFRAN) IV  Prior to Admission medications   Medication Sig Start Date End Date Taking? Authorizing Provider  gabapentin (NEURONTIN) 400 MG capsule Take 2 capsules (800 mg total) by mouth 3 (three) times daily. For agitation 10/07/15  Yes Encarnacion Slates, NP  hydrOXYzine (ATARAX/VISTARIL) 25 MG tablet Take 1 tablet (25 mg) four times daily as needed: For anxiety 10/07/15  Yes Encarnacion Slates, NP  levothyroxine (SYNTHROID, LEVOTHROID) 150 MCG tablet Take 1 tablet (150 mcg total) by mouth daily before breakfast. For low thyroid function 10/07/15  Yes Encarnacion Slates, NP  nicotine (NICODERM CQ - DOSED IN MG/24 HOURS) 21 mg/24hr patch Place 1 patch (21 mg total) onto the skin daily. For smoking cessation 10/07/15  Yes Encarnacion Slates, NP  omeprazole (PRILOSEC) 40 MG capsule Take 1 capsule (40 mg total) by mouth daily. For acid reflux 10/07/15  Yes Encarnacion Slates, NP  sertraline (ZOLOFT) 100 MG tablet Take 1 tablet (100 mg total) by mouth daily. For depression 10/07/15  Yes Encarnacion Slates, NP  topiramate (TOPAMAX) 25 MG tablet Take 1 tablet (25 mg total) by mouth 2 (two) times daily. 10/08/15  Yes Merrily Pew, MD       ANALYSIS: A 16 channel recording using standard 10 20 measurements is conducted for 21 minutes. There is a well-formed posterior dominant rhythm of 10 Hz which attenuates with eye opening. There is beta activity observed in the frontal areas. Awake and sleep activities are observed. Extensive spindling is observed consistent with stage II non-REM sleep. Photic simulation is carried out without abnormal changes in the background activity. There is no focal or lateral slowing. There is no epileptiform activity observed.   IMPRESSION: This recording is essentially unremarkable. There is increased spindling observed which can be seen in medication effect particularly due to benzodiazepines.       Antwane Grose A. Merlene Laughter, M.D.  Diplomate, Tax adviser of Psychiatry and Neurology ( Neurology).

## 2015-10-11 NOTE — Progress Notes (Signed)
Patient ID: Jacqueline [name], female   DOB: Feb 08, 1966, 50 y.o.   MRN: [id number]  [place] A. Merlene Laughter, MD     [email or web address]          Jacqueline [name] is an 50 y.o. female.   Assessment/Plan: Recurrent seizures and lady who has alcoholism. 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 Consequently, the patient will be continued antiseizure medications. She was recently started on Topamax and this to be continued. Dose can be increased later. She is a precaution was recommended. She is not to drive or operate machinery for at least 6 months. Alcohol cessation is also discussed.   Doing well. Results discussed.     GENERAL: Pleasant obese female in no acute distress.  HEENT: Supple. Atraumatic normocephalic. Lower lip on the left side shows small bruising. Tongue is fine.  ABDOMEN: soft  EXTREMITIES: No edema   BACK: Normal.  SKIN: Normal by inspection.   MENTAL STATUS: Alert and oriented. Speech, language and cognition are generally intact. Judgment and insight normal.   CRANIAL NERVES: Pupils are equal, round and reactive to light and accommodation; extra ocular movements are full, there is no significant nystagmus; visual fields are full; upper and lower facial muscles are normal in strength and symmetric, there is no flattening of the nasolabial folds; tongue is midline; uvula is midline; shoulder elevation is normal.  MOTOR: Normal tone, bulk and strength; no pronator drift.  COORDINATION: Left finger to nose is normal, right finger to nose is normal, No rest tremor; no intention tremor; no postural tremor; no bradykinesia.  REFLEXES: Deep tendon reflexes are symmetrical but brisk throughout. Babinski reflexes are flexor bilaterally.   SENSATION: Normal to light touch.  GAIT: Normal    EEG This recording is essentially unremarkable. There is increased spindling  observed which can be seen in medication effect particularly due to benzodiazepines.    Brain MRI with and without contrast unremarkable.   Objective: Vital signs in last 24 hours: Temp:  [98.1 F (36.7 C)-98.7 F (37.1 C)] 98.1 F (36.7 C) (01/03 1600) Pulse Rate:  [61-71] 71 (01/03 1600) Resp:  [16-20] 16 (01/03 1600) BP: (112-122)/(52-72) 121/61 mmHg (01/03 1600) SpO2:  [94 %-98 %] 96 % (01/03 1600) Weight:  [108.3 kg (238 lb 12.1 oz)] 108.3 kg (238 lb 12.1 oz) (01/03 0459)  Intake/Output from previous day: 01/02 0701 - 01/03 0700 In: 720 [P.O.:720] Out: -  Intake/Output this shift: Total I/O In: 240 [P.O.:240] Out: -  Nutritional status: Diet Heart Room service appropriate?: Yes; Fluid consistency:: Thin   Lab Results: No results found for this or any previous visit (from the past 48 hour(s)).  Lipid Panel No results for input(s): CHOL, TRIG, HDL, CHOLHDL, VLDL, LDLCALC in the last 72 hours.  Studies/Results:   Medications:  Scheduled Meds: . folic acid  1 mg Oral Daily  . gabapentin  800 mg Oral TID  . levothyroxine  150 mcg Oral QAC breakfast  . multivitamin with minerals  1 tablet Oral Daily  . nicotine  21 mg Transdermal Daily  . pantoprazole  40 mg Oral Daily  . sertraline  100 mg Oral Daily  . sodium chloride  3 mL Intravenous Q12H  . thiamine  100 mg Oral Daily   Or  . thiamine  100 mg Intravenous Daily  . topiramate  25 mg Oral BID   Continuous Infusions:  PRN Meds:.acetaminophen, hydrOXYzine, LORazepam **  OR** LORazepam, ondansetron **OR** ondansetron (ZOFRAN) IV     LOS: 2 days   Paxten Appelt A. Merlene Laughter, M.D.  Diplomate, Tax adviser of Psychiatry and Neurology ( Neurology).

## 2015-10-11 NOTE — Progress Notes (Signed)
[facility] PROGRESS NOTE  Jacqueline [name] [id number] DOB: August 03, 1966 DOA: 10/08/2015 PCP: No PCP Per Patient  Assessment/Plan: 50 y/o female with PMH of Alcohol, substance abuse, cocaine use, Depression, anxiety, benzodiazepine use , who was recently discharged from rehab presented with seizure   1. Seizure. Likely due to withdrawal seizure. No new episodes while inpatient, Neuro exam is non focal.  -Patient is evaluated by neurology, who suspects epilepsy, awaiting EEG, and MRI brain. appreciate  neurology input. Patient is already on topamax  2. Alcohol abuse history. No s/s of withdrawals. Recently detoxed  3. Depression. Cont home regimen. No s/s of suicidal ideations or plans 4. Hypothyroidism, on levothyroxine     Code Status: full Family Communication: d/w patient. person spoken with, relationship, and if by phone, the number) Disposition Plan: home pend EEG, MRI brain, neuro eval    Consultants:  Neurology   Procedures:  EEG-pend  Antibiotics:  none (indicate start date, and stop date if known)  HPI/Subjective: Alert, no distress, no acute issues   Objective: Filed Vitals:   10/11/15 0458 10/11/15 1000  BP: 122/68 119/52  Pulse: 70 61  Temp: 98.2 F (36.8 C) 98.7 F (37.1 C)  Resp: 18 18    Intake/Output Summary (Last 24 hours) at 10/11/15 1017 Last data filed at 10/11/15 0900  Gross per 24 hour  Intake    720 ml  Output      0 ml  Net    720 ml   Filed Weights   10/08/15 2241 10/10/15 0500 10/11/15 0459  Weight: 112.4 kg (247 lb 12.8 oz) 108.41 kg (239 lb) 108.3 kg (238 lb 12.1 oz)    Exam:   General:  No distress   Cardiovascular: s1,s2 rrr  Respiratory: CTA BL  Abdomen: soft, nt,nd   Musculoskeletal: no leg edema    Data Reviewed: Basic Metabolic Panel:  Recent Labs Lab 10/08/15 1220 10/09/15 0656  NA 139 138  K 3.7 3.6  CL 106 105  CO2 27 26  GLUCOSE 116* 103*  BUN 17 12  CREATININE 0.82 0.67  CALCIUM 8.8*  8.3*   Liver Function Tests:  Recent Labs Lab 10/08/15 1220  AST 45*  ALT 81*  ALKPHOS 88  BILITOT 0.2*  PROT 6.9  ALBUMIN 3.9   No results for input(s): LIPASE, AMYLASE in the last 168 hours. No results for input(s): AMMONIA in the last 168 hours. CBC:  Recent Labs Lab 10/08/15 1220 10/09/15 0656  WBC 7.4 6.3  NEUTROABS 5.3  --   HGB 10.0* 10.3*  HCT 31.5* 31.7*  MCV 97.5 97.2  PLT 258 258   Cardiac Enzymes: No results for input(s): CKTOTAL, CKMB, CKMBINDEX, TROPONINI in the last 168 hours. BNP (last 3 results) No results for input(s): BNP in the last 8760 hours.  ProBNP (last 3 results) No results for input(s): PROBNP in the last 8760 hours.  CBG: No results for input(s): GLUCAP in the last 168 hours.  No results found for this or any previous visit (from the past 240 hour(s)).   Studies: No results found.  Scheduled Meds: . folic acid  1 mg Oral Daily  . gabapentin  800 mg Oral TID  . levothyroxine  150 mcg Oral QAC breakfast  . multivitamin with minerals  1 tablet Oral Daily  . nicotine  21 mg Transdermal Daily  . pantoprazole  40 mg Oral Daily  . sertraline  100 mg Oral Daily  . sodium chloride  3 mL Intravenous Q12H  .  thiamine  100 mg Oral Daily   Or  . thiamine  100 mg Intravenous Daily  . topiramate  25 mg Oral BID   Continuous Infusions:   Principal Problem:   Seizure ([place]) Active Problems:   Polysubstance abuse   Anxiety disorder   Cocaine abuse, episodic   PTSD (post-traumatic stress disorder)   Dysuria    Time spent: >35 minutes     Kinnie Feil  [facility] Pager [phone]. If 7PM-7AM, please contact night-coverage at [email or web address], password [facility] 10/11/2015, 10:17 AM  LOS: 2 days

## 2015-10-12 DIAGNOSIS — F4310 Post-traumatic stress disorder, unspecified: Secondary | ICD-10-CM

## 2015-10-12 DIAGNOSIS — F419 Anxiety disorder, unspecified: Secondary | ICD-10-CM

## 2015-10-12 DIAGNOSIS — R569 Unspecified convulsions: Principal | ICD-10-CM

## 2015-10-12 DIAGNOSIS — F1910 Other psychoactive substance abuse, uncomplicated: Secondary | ICD-10-CM

## 2015-10-12 DIAGNOSIS — F1410 Cocaine abuse, uncomplicated: Secondary | ICD-10-CM

## 2015-10-12 DIAGNOSIS — R300 Dysuria: Secondary | ICD-10-CM

## 2015-10-12 MED ORDER — TOPIRAMATE 25 MG PO TABS: 25.0000 mg | ORAL_TABLET | Freq: Two times a day (BID) | ORAL | Status: DC

## 2015-10-12 MED ORDER — LEVOTHYROXINE SODIUM 175 MCG PO TABS: 175.0000 ug | ORAL_TABLET | Freq: Every day | ORAL | Status: DC

## 2015-10-12 NOTE — Progress Notes (Signed)
D/C instructions, paperwork and hard Rxs given to patient. IV catheter to RIGHT wrist region removed by patient, no bleeding noted. Friend at bedside to transport patient home. Medication voucher papers given to patient. Patient requested to walk out of facility w/o the aid of a w/c.

## 2015-10-12 NOTE — Discharge Summary (Signed)
Physician Discharge Summary  Jacqueline [name] [id number] DOB: 1966-08-24 DOA: 10/08/2015  PCP: No PCP Per Patient  Admit date: 10/08/2015 Discharge date: 10/12/2015  Time spent: 40 minutes  Recommendations for Outpatient Follow-up:  1. Follow-up with primary care physician within one week. 2. Follow-up With neurology in 6 weeks. 3. Synthroid increased 175 g daily, check TSH in 4-6 weeks   Discharge Diagnoses:  Principal Problem:   Seizure ([place]) Active Problems:   Polysubstance abuse   Anxiety disorder   Cocaine abuse, episodic   PTSD (post-traumatic stress disorder)   Dysuria   Discharge Condition: Stable  Diet recommendation: Heart healthy  Filed Weights   10/10/15 0500 10/11/15 0459 10/12/15 0637  Weight: 108.41 kg (239 lb) 108.3 kg (238 lb 12.1 oz) 107.593 kg (237 lb 3.2 oz)    History of present illness:  49 yo female h/o PTSD, alcohol abuse, benzo abuse, cocaine abuse, depression and anxiety comes in with second seizure today. Pt just went to detox and was discharged yesterday. Her last alcohol drink was 5 days ago, she was drinking over a case of beer a day. She was on klonopin 2mg  daily. First couple of days of detox were rough, with anxiety, tremors and was on ativan which helped. By yesterday morning she was given her last ativan 1mg  pill and sent home to a half way house. She reports she has not taken any etoh or other drugs since she left her detox. This am she was in an [place] meeting when she had a witnessed tonic/clonic seizure, she came to the ED was given a script for topomax which she did not fill and thought to be due to etoh withdrawal. Later in the day she was unloading her car at the halfway house when she had another 76min or so tonic/clonic seizure that was witnessed in the yard. Pt denies any fevers, recent illnesses besides the detoxing. She feels fine right now. She is complaining of dysuria for a day or so. She had a ua this am that showed  nitrites, neg leuk, no wbc. Pt referred for admission for likely etoh withdrawal seizures.  Hospital Course:   Seizure Patient presented with tonic-clonic seizures 2. This happened after 5 days from the last alcohol drink. Unclear if this is secondary to benzodiazepines withdrawal versus alcohol withdrawal. Patient evaluated by neurology, EEG is unremarkable as well as MRI. Patient is on Topamax, this is continues, patient to follow-up with neurology as outpatient in 4-6 weeks.   Alcohol abuse history No s/s of withdrawals. Recently detoxed   Depression Cont home regimen. No s/s of suicidal ideations or plans  Hypothyroidism, TSH is elevated at 8.917, patient reported good compliance with this as she's been on it since 1999. She was on Synthroid 150 g daily, increase the dose to 175 g daily, need TSH to be checked in 4-6 weeks.   Procedures:  EEG: This recording is essentially unremarkable, there is increased spindling observed which can be seen in medication effect particularly due to benzodiazepines.  Consultations:  Neurology  Discharge Exam: Filed Vitals:   10/11/15 2113 10/12/15 0637  BP: 117/72 127/77  Pulse: 71 80  Temp: 98 F (36.7 C) 98.4 F (36.9 C)  Resp: 16 20   General: Alert and awake, oriented x3, not in any acute distress. HEENT: anicteric sclera, pupils reactive to light and accommodation, EOMI CVS: S1-S2 clear, no murmur rubs or gallops Chest: clear to auscultation bilaterally, no wheezing, rales or rhonchi Abdomen: soft nontender, nondistended, normal  bowel sounds, no organomegaly Extremities: no cyanosis, clubbing or edema noted bilaterally Neuro: Cranial nerves II-XII intact, no focal neurological deficits  Discharge Instructions   Discharge Instructions    Diet - low sodium heart healthy    Complete by:  As directed      Increase activity slowly    Complete by:  As directed           Current Discharge Medication List    CONTINUE  these medications which have CHANGED   Details  levothyroxine (SYNTHROID, LEVOTHROID) 175 MCG tablet Take 1 tablet (175 mcg total) by mouth daily before breakfast. For low thyroid function Qty: 30 tablet, Refills: 0    topiramate (TOPAMAX) 25 MG tablet Take 1 tablet (25 mg total) by mouth 2 (two) times daily. Qty: 60 tablet, Refills: 0      CONTINUE these medications which have NOT CHANGED   Details  gabapentin (NEURONTIN) 400 MG capsule Take 2 capsules (800 mg total) by mouth 3 (three) times daily. For agitation Qty: 180 capsule, Refills: 0    hydrOXYzine (ATARAX/VISTARIL) 25 MG tablet Take 1 tablet (25 mg) four times daily as needed: For anxiety Qty: 30 tablet, Refills: 0    nicotine (NICODERM CQ - DOSED IN MG/24 HOURS) 21 mg/24hr patch Place 1 patch (21 mg total) onto the skin daily. For smoking cessation Qty: 28 patch, Refills: 0    omeprazole (PRILOSEC) 40 MG capsule Take 1 capsule (40 mg total) by mouth daily. For acid reflux Qty: 30 capsule, Refills: 0    sertraline (ZOLOFT) 100 MG tablet Take 1 tablet (100 mg total) by mouth daily. For depression Qty: 30 tablet, Refills: 0       Allergies  Allergen Reactions  . Nsaids Other (See Comments)    G.I. Bleed   Follow-up Information    Follow up with [facility], KOFI, MD In 4 weeks.   Specialty:  Neurology   Contact information:   [address] [place] [address] [phone]        The results of significant diagnostics from this hospitalization (including imaging, microbiology, ancillary and laboratory) are listed below for reference.    Significant Diagnostic Studies: Mr Kizzie Fantasia Contrast  10/25/15  CLINICAL DATA:  Weakness and seizure. Most recent seizure was 2 weeks ago. EXAM: MRI HEAD WITHOUT AND WITH CONTRAST TECHNIQUE: Multiplanar, multiecho pulse sequences of the brain and surrounding structures were obtained without and with intravenous contrast. CONTRAST:  71mL MULTIHANCE GADOBENATE DIMEGLUMINE  529 MG/ML IV SOLN COMPARISON:  Head CT 10/11/2014 FINDINGS: Calvarium and upper cervical spine: No focal marrow signal abnormality. Orbits: No significant findings. Sinuses and Mastoids: Chronic changes in the right temporal bone with under aeration and mild mucosal thickening. Brain: No cortical finding and to explain seizure. The hippocampi have symmetric and normal size and internal architecture. No atrophy. There are few scattered FLAIR hyperintensities in the cerebral white matter, allowable for age. No acute or remote infarct, hemorrhage, hydrocephalus, or mass lesion. Patent major intracranial vessels. IMPRESSION: Negative.  No seizure focus identified. Electronically Signed   By: Monte Fantasia M.D.   On: 2015/10/25 13:13    Microbiology: No results found for this or any previous visit (from the past 240 hour(s)).   Labs: Basic Metabolic Panel:  Recent Labs Lab 10/08/15 1220 10/09/15 0656  NA 139 138  K 3.7 3.6  CL 106 105  CO2 27 26  GLUCOSE 116* 103*  BUN 17 12  CREATININE 0.82 0.67  CALCIUM 8.8*  8.3*   Liver Function Tests:  Recent Labs Lab 10/08/15 1220  AST 45*  ALT 81*  ALKPHOS 88  BILITOT 0.2*  PROT 6.9  ALBUMIN 3.9   No results for input(s): LIPASE, AMYLASE in the last 168 hours. No results for input(s): AMMONIA in the last 168 hours. CBC:  Recent Labs Lab 10/08/15 1220 10/09/15 0656  WBC 7.4 6.3  NEUTROABS 5.3  --   HGB 10.0* 10.3*  HCT 31.5* 31.7*  MCV 97.5 97.2  PLT 258 258   Cardiac Enzymes: No results for input(s): CKTOTAL, CKMB, CKMBINDEX, TROPONINI in the last 168 hours. BNP: BNP (last 3 results) No results for input(s): BNP in the last 8760 hours.  ProBNP (last 3 results) No results for input(s): PROBNP in the last 8760 hours.  CBG: No results for input(s): GLUCAP in the last 168 hours.     Signed:  Birdie Hopes MD  [facility] 10/12/2015, 11:56 AM

## 2015-10-12 NOTE — Care Management Note (Signed)
Case Management Note  Patient Details  Name: YUHAN MORSEY MRN: [id number] Date of Birth: 1966/05/30  Subjective/Objective:                    Action/Plan:   Expected Discharge Date:                  Expected Discharge Plan:  Home/Self Care  In-House Referral:  Financial Counselor  Discharge planning Services  CM Consult, [facility] Program  Post Acute Care Choice:  NA Choice offered to:  NA  DME Arranged:    DME Agency:     HH Arranged:    [place] Agency:     Status of Service:  Completed, signed off  Medicare Important Message Given:    Date Medicare IM Given:    Medicare IM give by:    Date Additional Medicare IM Given:    Additional Medicare Important Message give by:     If discussed at [place] of Stay Meetings, dates discussed:    Additional Comments: Pt discharged today with [place] voucher. No further CM needs noted. Christinia Gully [place], RN 10/12/2015, 2:32 PM

## 2015-12-01 ENCOUNTER — Encounter (HOSPITAL_COMMUNITY): Payer: Self-pay | Admitting: Emergency Medicine

## 2015-12-01 ENCOUNTER — Emergency Department (HOSPITAL_COMMUNITY)
Admission: EM | Admit: 2015-12-01 | Discharge: 2015-12-01 | Disposition: A | Discharge status: Home / Self Care | Payer: MEDICAID | Attending: Emergency Medicine | Admitting: Emergency Medicine

## 2015-12-01 DIAGNOSIS — F17210 Nicotine dependence, cigarettes, uncomplicated: Secondary | ICD-10-CM | POA: Insufficient documentation

## 2015-12-01 DIAGNOSIS — F419 Anxiety disorder, unspecified: Secondary | ICD-10-CM | POA: Insufficient documentation

## 2015-12-01 DIAGNOSIS — Z862 Personal history of diseases of the blood and blood-forming organs and certain disorders involving the immune mechanism: Secondary | ICD-10-CM | POA: Insufficient documentation

## 2015-12-01 DIAGNOSIS — E039 Hypothyroidism, unspecified: Secondary | ICD-10-CM | POA: Insufficient documentation

## 2015-12-01 DIAGNOSIS — F329 Major depressive disorder, single episode, unspecified: Secondary | ICD-10-CM | POA: Insufficient documentation

## 2015-12-01 DIAGNOSIS — R1084 Generalized abdominal pain: Secondary | ICD-10-CM | POA: Insufficient documentation

## 2015-12-01 DIAGNOSIS — F10920 Alcohol use, unspecified with intoxication, uncomplicated: Secondary | ICD-10-CM

## 2015-12-01 DIAGNOSIS — F10120 Alcohol abuse with intoxication, uncomplicated: Secondary | ICD-10-CM | POA: Insufficient documentation

## 2015-12-01 DIAGNOSIS — Z8711 Personal history of peptic ulcer disease: Secondary | ICD-10-CM | POA: Insufficient documentation

## 2015-12-01 DIAGNOSIS — Z79899 Other long term (current) drug therapy: Secondary | ICD-10-CM | POA: Insufficient documentation

## 2015-12-01 DIAGNOSIS — R441 Visual hallucinations: Secondary | ICD-10-CM | POA: Insufficient documentation

## 2015-12-01 LAB — COMPREHENSIVE METABOLIC PANEL
ALBUMIN: 3.7 g/dL (ref 3.5–5.0)
ALK PHOS: 138 U/L — AB (ref 38–126)
ALT: 278 U/L — AB (ref 14–54)
AST: 487 U/L — ABNORMAL HIGH (ref 15–41)
Anion gap: 28 — ABNORMAL HIGH (ref 5–15)
BUN: 25 mg/dL — AB (ref 6–20)
CALCIUM: 7.3 mg/dL — AB (ref 8.9–10.3)
CO2: 16 mmol/L — AB (ref 22–32)
Chloride: 90 mmol/L — ABNORMAL LOW (ref 101–111)
Creatinine, Ser: 0.87 mg/dL (ref 0.44–1.00)
GFR calc non Af Amer: >60 mL/min (ref >60)
GLUCOSE: 100 mg/dL — AB (ref 65–99)
Potassium: 3.8 mmol/L (ref 3.5–5.1)
SODIUM: 134 mmol/L — AB (ref 135–145)
Total Bilirubin: 1.2 mg/dL (ref 0.3–1.2)
Total Protein: 7.1 g/dL (ref 6.5–8.1)

## 2015-12-01 LAB — CBC WITH DIFFERENTIAL/PLATELET
Basophils Absolute: 0 10*3/uL (ref 0.0–0.1)
Basophils Relative: 0 %
EOS ABS: 0 10*3/uL (ref 0.0–0.7)
Eosinophils Relative: 0 %
HCT: 37.5 % (ref 36.0–46.0)
HEMOGLOBIN: 12.1 g/dL (ref 12.0–15.0)
LYMPHS ABS: 0.6 10*3/uL — AB (ref 0.7–4.0)
Lymphocytes Relative: 8 %
MCH: 29.7 pg (ref 26.0–34.0)
MCHC: 32.3 g/dL (ref 30.0–36.0)
MCV: 92.1 fL (ref 78.0–100.0)
Monocytes Absolute: 0.5 10*3/uL (ref 0.1–1.0)
Monocytes Relative: 6 %
NEUTROS ABS: 7 10*3/uL (ref 1.7–7.7)
NEUTROS PCT: 86 %
Platelets: 253 10*3/uL (ref 150–400)
RBC: 4.07 MIL/uL (ref 3.87–5.11)
RDW: 16.6 % — ABNORMAL HIGH (ref 11.5–15.5)
WBC: 8.2 10*3/uL (ref 4.0–10.5)

## 2015-12-01 LAB — ACETAMINOPHEN LEVEL: Acetaminophen (Tylenol), Serum: <10 ug/mL — ABNORMAL LOW (ref 10–30)

## 2015-12-01 LAB — ETHANOL: Alcohol, Ethyl (B): 371 mg/dL (ref <5)

## 2015-12-01 LAB — SALICYLATE LEVEL: Salicylate Lvl: <4 mg/dL (ref 2.8–30.0)

## 2015-12-01 NOTE — ED Notes (Signed)
Pt left AMA with transportation from friend named Juanda Crumble. Pt witnessed leaving in vehicle by security.

## 2015-12-01 NOTE — ED Notes (Signed)
Pt cursing, walked out of department. Sitter, RN and EDP verbally attempted to stop pt and have her return to room. Pt refused and proceeded into parking lot, unsteady gait. Security and [employer] notifed. [employer] to return pt to ED. EDP to file IVC to detain and medically clear patient.

## 2015-12-01 NOTE — ED Provider Notes (Addendum)
CSN: [id number]     Arrival date & time 12/01/15  1234 History   First MD Initiated Contact with Patient 12/01/15 1253     Chief Complaint  Patient presents with  . Alcohol Intoxication     (Consider location/radiation/quality/duration/timing/severity/associated sxs/prior Treatment) HPI Comments: The patient is a 50 year old female, she has a history of alcoholism, depression, she has benzodiazepine abuse and narcotic abuse in her history. She presents today from home after her roommate who she lives with brought her to the hospital, they report that she is having hallucinations, seeing bugs on the walls, the patient states that she is chronically depressed and is an alcoholic and has been drinking Listerine daily for the last 10 days. She has not been bathing for at least 8 days. The symptoms are gradually worsening, she denies abdominal pain. The patient did reportedly fall out of the chair in the waiting room, she is artery ingested one bottle of Listerine today. She reports that she drinks the Listerine because she is out of beer which she prefers.  Patient is a 50 y.o. female presenting with intoxication. The history is provided by the patient.  Alcohol Intoxication    Past Medical History  Diagnosis Date  . Depression   . Peptic ulcer   . Alcohol abuse   . Anemia   . Benzodiazepine abuse   . Thyroid disease   . Alcoholism ([place])   . Narcotic abuse   . Back pain   . Thrombocytopenia ([place]) 06/17/2011  . Hypothyroidism   . Seizures ([place])   . Hypoglycemia   . Suicide attempt ([place])     multiple times  . Anxiety    Past Surgical History  Procedure Laterality Date  . Cholecystectomy    . Abdominal surgery    . Esophagogastroduodenoscopy    . Gastric bypass    . Tubal ligation    . Esophagogastroduodenoscopy N/A 03/23/2013    Procedure: ESOPHAGOGASTRODUODENOSCOPY (EGD);  Surgeon: Ladene Artist, MD;  Location: [facility];  Service: Endoscopy;  Laterality: N/A;   Family  History  Problem Relation Age of Onset  . Alcohol abuse Father   . Alcoholism Father   . Cancer Other    Social History  Substance Use Topics  . Smoking status: Current Every Day Smoker    Types: Cigarettes  . Smokeless tobacco: None  . Alcohol Use: Yes     Comment: drink 4 liters a day. 1 case/day drinks Listerine/ 21 days sober today 03/12/15, per pt   OB History    Gravida Para Term Preterm AB TAB SAB Ectopic Multiple Living   3 3 0 3 0 0 0 0       Review of Systems  All other systems reviewed and are negative.     Allergies  Nsaids  Home Medications   Prior to Admission medications   Medication Sig Start Date End Date Taking? Authorizing Provider  amitriptyline (ELAVIL) 50 MG tablet Take 100 mg by mouth daily.   Yes Historical Provider, MD  clonazePAM (KLONOPIN) 0.5 MG tablet Take 0.5 mg by mouth 2 (two) times daily.   Yes Historical Provider, MD  gabapentin (NEURONTIN) 400 MG capsule Take 2 capsules (800 mg total) by mouth 3 (three) times daily. For agitation Patient taking differently: Take 800 mg by mouth 3 (three) times daily as needed (pain). For agitation 10/07/15  Yes Encarnacion Slates, NP  levothyroxine (SYNTHROID, LEVOTHROID) 175 MCG tablet Take 1 tablet (175 mcg total) by mouth daily before breakfast.  For low thyroid function 10/12/15  Yes Verlee Monte, MD  sertraline (ZOLOFT) 100 MG tablet Take 1 tablet (100 mg total) by mouth daily. For depression 10/07/15  Yes Encarnacion Slates, NP  topiramate (TOPAMAX) 25 MG tablet Take 1 tablet (25 mg total) by mouth 2 (two) times daily. 10/12/15  Yes Verlee Monte, MD  hydrOXYzine (ATARAX/VISTARIL) 25 MG tablet Take 1 tablet (25 mg) four times daily as needed: For anxiety 10/07/15   Encarnacion Slates, NP  nicotine (NICODERM CQ - DOSED IN MG/24 HOURS) 21 mg/24hr patch Place 1 patch (21 mg total) onto the skin daily. For smoking cessation Patient not taking: Reported on 12/01/2015 10/07/15   Encarnacion Slates, NP  omeprazole (PRILOSEC) 40 MG  capsule Take 1 capsule (40 mg total) by mouth daily. For acid reflux 10/07/15   Encarnacion Slates, NP   BP 123/73 mmHg  Pulse 112  Temp(Src) 97.8 F (36.6 C) (Oral)  Resp 16  SpO2 98%  LMP  (LMP Unknown) Physical Exam  Constitutional: She appears well-developed and well-nourished. No distress.  HENT:  Head: Normocephalic and atraumatic.  Mouth/Throat: Oropharynx is clear and moist. No oropharyngeal exudate.  Eyes: Conjunctivae and EOM are normal. Pupils are equal, round, and reactive to light. Right eye exhibits no discharge. Left eye exhibits no discharge. No scleral icterus.  Neck: Normal range of motion. Neck supple. No JVD present. No thyromegaly present.  Cardiovascular: Normal rate, regular rhythm, normal heart sounds and intact distal pulses.  Exam reveals no gallop and no friction rub.   No murmur heard. Pulmonary/Chest: Effort normal and breath sounds normal. No respiratory distress. She has no wheezes. She has no rales.  Abdominal: Soft. Bowel sounds are normal. She exhibits no distension and no mass. There is tenderness ( minimal diffuse abd ttp with soft abd and no guarding).  Musculoskeletal: Normal range of motion. She exhibits no edema or tenderness.  Lymphadenopathy:    She has no cervical adenopathy.  Neurological: She is alert. Coordination normal.  The patient has a somnolent appearance, she is clearly alcohol intoxicated, she follows commands but is very slow to do so.  Skin: Skin is warm and dry. No rash noted. No erythema.  Psychiatric: She has a normal mood and affect. Her behavior is normal.  Nursing note and vitals reviewed.   ED Course  Procedures (including critical care time) Labs Review Labs Reviewed  ETHANOL - Abnormal; Notable for the following:    Alcohol, Ethyl (B) 371 (*)    All other components within normal limits  CBC WITH DIFFERENTIAL/PLATELET - Abnormal; Notable for the following:    RDW 16.6 (*)    Lymphs Abs 0.6 (*)    All other components  within normal limits  COMPREHENSIVE METABOLIC PANEL - Abnormal; Notable for the following:    Sodium 134 (*)    Chloride 90 (*)    CO2 16 (*)    Glucose, Bld 100 (*)    BUN 25 (*)    Calcium 7.3 (*)    AST 487 (*)    ALT 278 (*)    Alkaline Phosphatase 138 (*)    Anion gap 28 (*)    All other components within normal limits  ACETAMINOPHEN LEVEL - Abnormal; Notable for the following:    Acetaminophen (Tylenol), Serum <10 (*)    All other components within normal limits  SALICYLATE LEVEL  URINE RAPID DRUG SCREEN, HOSP PERFORMED  URINALYSIS, ROUTINE W REFLEX MICROSCOPIC (NOT AT [facility])  PREGNANCY, URINE  Imaging Review No results found. I have personally reviewed and evaluated these images and lab results as part of my medical decision-making.   EKG Interpretation None      MDM   Final diagnoses:  Alcohol intoxication, uncomplicated ([place])   will need to check for coingestants including aspirin, Tylenol, labs, she will need to sober up. She is clearly not taking care of herself, will obtain a get psychiatric consultation.  The patient initially left the emergency [place], she was extremely intoxicated at that time, she had a staggering gait, she was able to make it through the doors to the parking lot however local police were able to collect the patient and bring her back for further evaluation. We were able to maintain her examination of her for another couple of hours, during this time she was able to speak more clearly, she was able to understand her results, she was able to walk with a steady gait and was able to understand the indications of leaving [place] including but not limited to heart attack, severe metabolic disturbance like a metabolic acidosis which I believe she has as well as potential death. She expressed her understanding, she was very clear with her understanding, she still desired to leave. Despite her alcohol level I do  believe that she likely lives at a very high alcohol level and her mental status was very clear at the time that she left again [place].  The pt left AMA after her labs returned showing that she was intoxicated with ETOH and her elevated lactic acid, I strongly her sedation to stay to receive further treatment including IV fluids, observation, she absolutely refused.  Salicylate negative, the patient had a sober ride to pick her up  Noemi Chapel, MD 12/01/15 [place], MD 12/01/15 [phone]

## 2015-12-01 NOTE — ED Notes (Signed)
Pt fell in floor out of chair in waiting room, intoxicated from drinking one bottle of listerine, having hallucinations, seeing bugs on the wall.  Pt denies si/hi.

## 2015-12-01 NOTE — ED Notes (Addendum)
Pt returned to ED lobby by [employer]. Pt returned to room by security. Pt heavily soiled with urine a feces. Underwear and leggings discarded.

## 2015-12-01 NOTE — ED Notes (Signed)
[id number], EDP notified.

## 2015-12-01 NOTE — ED Notes (Signed)
Pt intoxicated. Yelling and cursing. Pt placed in room.Decreased stimuli. Sitter at bedside.

## 2015-12-07 ENCOUNTER — Encounter (HOSPITAL_COMMUNITY): Payer: Self-pay | Admitting: Emergency Medicine

## 2015-12-07 ENCOUNTER — Emergency Department (HOSPITAL_COMMUNITY)
Admission: EM | Admit: 2015-12-07 | Discharge: 2015-12-07 | Disposition: A | Discharge status: Home / Self Care | Payer: MEDICAID | Attending: Emergency Medicine | Admitting: Emergency Medicine

## 2015-12-07 DIAGNOSIS — Z79899 Other long term (current) drug therapy: Secondary | ICD-10-CM | POA: Insufficient documentation

## 2015-12-07 DIAGNOSIS — F17210 Nicotine dependence, cigarettes, uncomplicated: Secondary | ICD-10-CM | POA: Insufficient documentation

## 2015-12-07 DIAGNOSIS — N390 Urinary tract infection, site not specified: Secondary | ICD-10-CM

## 2015-12-07 DIAGNOSIS — F329 Major depressive disorder, single episode, unspecified: Secondary | ICD-10-CM | POA: Insufficient documentation

## 2015-12-07 DIAGNOSIS — R569 Unspecified convulsions: Secondary | ICD-10-CM | POA: Insufficient documentation

## 2015-12-07 LAB — URINE MICROSCOPIC-ADD ON

## 2015-12-07 LAB — URINALYSIS, ROUTINE W REFLEX MICROSCOPIC
Glucose, UA: 250 mg/dL — AB
Ketones, ur: 15 mg/dL — AB
Nitrite: POSITIVE — AB
Protein, ur: >300 mg/dL — AB
SPECIFIC GRAVITY, URINE: 1.01 (ref 1.005–1.030)
pH: 6.5 (ref 5.0–8.0)

## 2015-12-07 LAB — POC URINE PREG, ED: PREG TEST UR: NEGATIVE

## 2015-12-07 MED ORDER — CIPROFLOXACIN HCL 500 MG PO TABS: 500.0000 mg | ORAL_TABLET | Freq: Two times a day (BID) | ORAL | Status: DC

## 2015-12-07 MED ORDER — CIPROFLOXACIN HCL 250 MG PO TABS
500.0000 mg | ORAL_TABLET | Freq: Once | ORAL | Status: AC
  Administered 2015-12-07: 500 mg via ORAL
  Filled 2015-12-07: qty 2

## 2015-12-07 MED ORDER — HYDROCODONE-ACETAMINOPHEN 5-325 MG PO TABS
1.0000 | ORAL_TABLET | Freq: Once | ORAL | Status: AC
  Administered 2015-12-07: 1 via ORAL
  Filled 2015-12-07: qty 1

## 2015-12-07 MED ORDER — HYDROCODONE-ACETAMINOPHEN 5-325 MG PO TABS: ORAL_TABLET | ORAL | Status: DC

## 2015-12-07 NOTE — ED Notes (Signed)
C/o back pain, pressure when voiding and vagina burning.  Taken OTC meds with no relief.

## 2015-12-07 NOTE — Discharge Instructions (Signed)

## 2015-12-08 NOTE — ED Provider Notes (Signed)
CSN: [id number]     Arrival date & time 12/07/15  1001 History   First MD Initiated Contact with Patient 12/07/15 1041     Chief Complaint  Patient presents with  . Recurrent UTI     (Consider location/radiation/quality/duration/timing/severity/associated sxs/prior Treatment) HPI   Jacqueline [name] is a 50 y.o. female who presents to the Emergency Department complaining of low back pain, urinary frequency, pressure with voiding and burning sensation.  She states that she has been taking OTC analgesics without relief.  She reports frequent UTI's and pain feels similar to previous.  She denies fever, vomiting, vaginal bleeding, new sexual partners or vaginal discharge.      Past Medical History  Diagnosis Date  . Depression   . Peptic ulcer   . Alcohol abuse   . Anemia   . Benzodiazepine abuse   . Thyroid disease   . Alcoholism ([place])   . Narcotic abuse   . Back pain   . Thrombocytopenia ([place]) 06/17/2011  . Hypothyroidism   . Seizures ([place])   . Hypoglycemia   . Suicide attempt ([place])     multiple times  . Anxiety    Past Surgical History  Procedure Laterality Date  . Cholecystectomy    . Abdominal surgery    . Esophagogastroduodenoscopy    . Gastric bypass    . Tubal ligation    . Esophagogastroduodenoscopy N/A 03/23/2013    Procedure: ESOPHAGOGASTRODUODENOSCOPY (EGD);  Surgeon: Ladene Artist, MD;  Location: [facility];  Service: Endoscopy;  Laterality: N/A;   Family History  Problem Relation Age of Onset  . Alcohol abuse Father   . Alcoholism Father   . Cancer Other    Social History  Substance Use Topics  . Smoking status: Current Every Day Smoker    Types: Cigarettes  . Smokeless tobacco: None  . Alcohol Use: Yes     Comment: drink 4 liters a day. 1 case/day drinks [place]/ 21 days sober today 03/12/15, per pt   OB History    Gravida Para Term Preterm AB TAB SAB Ectopic Multiple Living   3 3 0 3 0 0 0 0       Review of Systems  Constitutional:  Negative for fever, chills, activity change and appetite change.  Respiratory: Negative for chest tightness and shortness of breath.   Gastrointestinal: Negative for nausea, vomiting and abdominal pain (lower abdominal pain).  Genitourinary: Positive for dysuria, urgency and frequency. Negative for hematuria, flank pain, decreased urine volume and difficulty urinating.  Musculoskeletal: Positive for back pain (low back pain).  Skin: Negative for rash.  Neurological: Negative for dizziness, weakness and numbness.  Hematological: Negative for adenopathy.  Psychiatric/Behavioral: Negative for confusion.  All other systems reviewed and are negative.     Allergies  Nsaids  Home Medications   Prior to Admission medications   Medication Sig Start Date End Date Taking? Authorizing Provider  amitriptyline (ELAVIL) 50 MG tablet Take 100 mg by mouth daily.    Historical Provider, MD  ciprofloxacin (CIPRO) 500 MG tablet Take 1 tablet (500 mg total) by mouth 2 (two) times daily. For 7 days 12/07/15   Leyla Soliz, PA-C  clonazePAM (KLONOPIN) 0.5 MG tablet Take 0.5 mg by mouth 2 (two) times daily.    Historical Provider, MD  gabapentin (NEURONTIN) 400 MG capsule Take 2 capsules (800 mg total) by mouth 3 (three) times daily. For agitation Patient taking differently: Take 800 mg by mouth 3 (three) times daily as needed (pain). For  agitation 10/07/15   Encarnacion Slates, NP  HYDROcodone-acetaminophen (NORCO/VICODIN) 5-325 MG tablet Take one-two tabs po q 4-6 hrs prn pain 12/07/15   Kenlyn Lose, PA-C  hydrOXYzine (ATARAX/VISTARIL) 25 MG tablet Take 1 tablet (25 mg) four times daily as needed: For anxiety 10/07/15   Encarnacion Slates, NP  levothyroxine (SYNTHROID, LEVOTHROID) 175 MCG tablet Take 1 tablet (175 mcg total) by mouth daily before breakfast. For low thyroid function 10/12/15   Verlee Monte, MD  nicotine (NICODERM CQ - DOSED IN MG/24 HOURS) 21 mg/24hr patch Place 1 patch (21 mg total) onto the skin daily.  For smoking cessation Patient not taking: Reported on 12/01/2015 10/07/15   Encarnacion Slates, NP  omeprazole (PRILOSEC) 40 MG capsule Take 1 capsule (40 mg total) by mouth daily. For acid reflux 10/07/15   Encarnacion Slates, NP  sertraline (ZOLOFT) 100 MG tablet Take 1 tablet (100 mg total) by mouth daily. For depression 10/07/15   Encarnacion Slates, NP  topiramate (TOPAMAX) 25 MG tablet Take 1 tablet (25 mg total) by mouth 2 (two) times daily. 10/12/15   Verlee Monte, MD   BP 109/74 mmHg  Pulse 95  Temp(Src) 98.4 F (36.9 C)  Resp 16  Ht 5\' 9"  (1.753 m)  Wt 103.42 kg  BMI 33.65 kg/m2  SpO2 98%  LMP 11/30/2015 Physical Exam  Constitutional: She is oriented to person, place, and time. She appears well-developed and well-nourished. No distress.  HENT:  Head: Normocephalic and atraumatic.  Cardiovascular: Normal rate, regular rhythm, normal heart sounds and intact distal pulses.   No murmur heard. Pulmonary/Chest: Effort normal and breath sounds normal. No respiratory distress. She has no wheezes. She has no rales.  Abdominal: Soft. Normal appearance. She exhibits no distension and no mass. There is no hepatosplenomegaly. There is tenderness in the suprapubic area. There is no rigidity, no rebound, no guarding, no CVA tenderness and no tenderness at McBurney's point.  Mild ttp of the suprapubic region.  Remaining abdomen is soft, non-tender without guarding or rebound tenderness. No CVA tenderness  Musculoskeletal: Normal range of motion. She exhibits no edema.  Neurological: She is alert and oriented to person, place, and time. Coordination normal.  Skin: Skin is warm and dry. No rash noted.  Nursing note and vitals reviewed.   ED Course  Procedures (including critical care time) Labs Review Labs Reviewed  URINALYSIS, ROUTINE W REFLEX MICROSCOPIC (NOT AT [facility]) - Abnormal; Notable for the following:    Color, Urine BROWN (*)    APPearance HAZY (*)    Glucose, UA 250 (*)    Hgb urine dipstick  SMALL (*)    Bilirubin Urine LARGE (*)    Ketones, ur 15 (*)    Protein, ur >300 (*)    Nitrite POSITIVE (*)    Leukocytes, UA SMALL (*)    All other components within normal limits  URINE MICROSCOPIC-ADD ON - Abnormal; Notable for the following:    Squamous Epithelial / LPF 0-5 (*)    Bacteria, UA MANY (*)    All other components within normal limits  URINE CULTURE  POC URINE PREG, ED    Imaging Review No results found. I have personally reviewed and evaluated these images and lab results as part of my medical decision-making.   EKG Interpretation None      MDM   Final diagnoses:  UTI (lower urinary tract infection)    Pt is uncomfortable, but non-toxic appearing, possible early pyelonephritis.  Reports frequent UTI's  and states that cipro is only abx that has been effective.  I have advised her to drink plenty water, urine cultured.  Appears stable for d/c and advised to return for fever, increasing pain, vomiting.      Kem Parkinson, PA-C 12/08/15 1821  Pattricia Boss, MD 12/20/15 [phone]

## 2015-12-09 LAB — URINE CULTURE: Culture: NO GROWTH

## 2016-02-19 ENCOUNTER — Encounter (HOSPITAL_COMMUNITY): Payer: Self-pay | Admitting: Emergency Medicine

## 2016-02-19 ENCOUNTER — Emergency Department (HOSPITAL_COMMUNITY)
Admission: EM | Admit: 2016-02-19 | Discharge: 2016-02-19 | Disposition: A | Discharge status: Home / Self Care | Payer: MEDICAID | Attending: Emergency Medicine | Admitting: Emergency Medicine

## 2016-02-19 DIAGNOSIS — F17210 Nicotine dependence, cigarettes, uncomplicated: Secondary | ICD-10-CM | POA: Insufficient documentation

## 2016-02-19 DIAGNOSIS — F329 Major depressive disorder, single episode, unspecified: Secondary | ICD-10-CM | POA: Insufficient documentation

## 2016-02-19 DIAGNOSIS — R609 Edema, unspecified: Secondary | ICD-10-CM

## 2016-02-19 DIAGNOSIS — E039 Hypothyroidism, unspecified: Secondary | ICD-10-CM | POA: Insufficient documentation

## 2016-02-19 DIAGNOSIS — R600 Localized edema: Secondary | ICD-10-CM | POA: Insufficient documentation

## 2016-02-19 MED ORDER — FUROSEMIDE 20 MG PO TABS: 20.0000 mg | ORAL_TABLET | Freq: Every day | ORAL | Status: DC | PRN

## 2016-02-19 NOTE — ED Notes (Signed)
Patient complaining of bilateral lower leg swelling x 4 days. States "I think my gabapentin is causing it, it's done it before so I cut my dose back but it's worse."

## 2016-02-19 NOTE — ED Provider Notes (Signed)
CSN: [id number]     Arrival date & time 02/19/16  1903 History   First MD Initiated Contact with Patient 02/19/16 1931     Chief Complaint  Patient presents with  . Leg Swelling     (Consider location/radiation/quality/duration/timing/severity/associated sxs/prior Treatment) HPI Patient presents with one week of bilateral lower extremity swelling. States this happens periodically. She denies any shortness of breath. No injury or new medication. Is concerned this may be related to her gabapentin. She's decreased her dose with no improvement in the swelling. Patient states she drinks 8-10 20 oz bottles of water daily. No recent immobilization. No tenderness or redness. Past Medical History  Diagnosis Date  . Depression   . Peptic ulcer   . Alcohol abuse   . Anemia   . Benzodiazepine abuse   . Thyroid disease   . Alcoholism ([place])   . Narcotic abuse   . Back pain   . Thrombocytopenia ([place]) 06/17/2011  . Hypothyroidism   . Seizures ([place])   . Hypoglycemia   . Suicide attempt ([place])     multiple times  . Anxiety    Past Surgical History  Procedure Laterality Date  . Cholecystectomy    . Abdominal surgery    . Esophagogastroduodenoscopy    . Gastric bypass    . Tubal ligation    . Esophagogastroduodenoscopy N/A 03/23/2013    Procedure: ESOPHAGOGASTRODUODENOSCOPY (EGD);  Surgeon: Ladene Artist, MD;  Location: [facility];  Service: Endoscopy;  Laterality: N/A;   Family History  Problem Relation Age of Onset  . Alcohol abuse Father   . Alcoholism Father   . Cancer Other    Social History  Substance Use Topics  . Smoking status: Current Every Day Smoker    Types: Cigarettes  . Smokeless tobacco: None  . Alcohol Use: Yes     Comment: Patient states she is sober for 76 days.   OB History    Gravida Para Term Preterm AB TAB SAB Ectopic Multiple Living   3 3 0 3 0 0 0 0       Review of Systems  Constitutional: Negative for fever and chills.  Respiratory: Negative for  cough and shortness of breath.   Cardiovascular: Positive for leg swelling. Negative for chest pain.  Gastrointestinal: Negative for nausea, vomiting, abdominal pain, diarrhea and constipation.  Musculoskeletal: Negative for myalgias, back pain, neck pain and neck stiffness.  Skin: Negative for rash and wound.  Neurological: Negative for dizziness, weakness, light-headedness, numbness and headaches.  All other systems reviewed and are negative.     Allergies  Nsaids  Home Medications   Prior to Admission medications   Medication Sig Start Date End Date Taking? Authorizing Provider  amitriptyline (ELAVIL) 50 MG tablet Take 100 mg by mouth daily.    Historical Provider, MD  ciprofloxacin (CIPRO) 500 MG tablet Take 1 tablet (500 mg total) by mouth 2 (two) times daily. For 7 days 12/07/15   [name], PA-C  clonazePAM (KLONOPIN) 0.5 MG tablet Take 0.5 mg by mouth 2 (two) times daily.    Historical Provider, MD  furosemide (LASIX) 20 MG tablet Take 1 tablet (20 mg total) by mouth daily as needed for edema. 02/19/16   Julianne Rice, MD  gabapentin (NEURONTIN) 400 MG capsule Take 2 capsules (800 mg total) by mouth 3 (three) times daily. For agitation Patient taking differently: Take 800 mg by mouth 3 (three) times daily as needed (pain). For agitation 10/07/15   Encarnacion Slates, NP  HYDROcodone-acetaminophen (NORCO/VICODIN) 5-325 MG tablet Take one-two tabs po q 4-6 hrs prn pain 12/07/15   [name], PA-C  hydrOXYzine (ATARAX/VISTARIL) 25 MG tablet Take 1 tablet (25 mg) four times daily as needed: For anxiety 10/07/15   Encarnacion Slates, NP  levothyroxine (SYNTHROID, LEVOTHROID) 175 MCG tablet Take 1 tablet (175 mcg total) by mouth daily before breakfast. For low thyroid function 10/12/15   Verlee Monte, MD  nicotine (NICODERM CQ - DOSED IN MG/24 HOURS) 21 mg/24hr patch Place 1 patch (21 mg total) onto the skin daily. For smoking cessation Patient not taking: Reported on 12/01/2015 10/07/15    Encarnacion Slates, NP  omeprazole (PRILOSEC) 40 MG capsule Take 1 capsule (40 mg total) by mouth daily. For acid reflux 10/07/15   Encarnacion Slates, NP  sertraline (ZOLOFT) 100 MG tablet Take 1 tablet (100 mg total) by mouth daily. For depression 10/07/15   Encarnacion Slates, NP  topiramate (TOPAMAX) 25 MG tablet Take 1 tablet (25 mg total) by mouth 2 (two) times daily. 10/12/15   Verlee Monte, MD   BP 117/68 mmHg  Pulse 82  Temp(Src) 97.8 F (36.6 C) (Oral)  Resp 16  Ht 5\' 9"  (1.753 m)  Wt 248 lb (112.492 kg)  BMI 36.61 kg/m2  SpO2 99%  LMP 02/17/2016 Physical Exam  Constitutional: She is oriented to person, place, and time. She appears well-developed and well-nourished. No distress.  HENT:  Head: Normocephalic and atraumatic.  Mouth/Throat: Oropharynx is clear and moist.  Eyes: EOM are normal. Pupils are equal, round, and reactive to light.  Neck: Normal range of motion. Neck supple. No JVD present.  Cardiovascular: Normal rate and regular rhythm.   Pulmonary/Chest: Effort normal and breath sounds normal. No respiratory distress. She has no wheezes. She has no rales. She exhibits no tenderness.  Abdominal: Soft. Bowel sounds are normal. She exhibits no distension and no mass. There is no tenderness. There is no rebound and no guarding.  Musculoskeletal: Normal range of motion. She exhibits edema. She exhibits no tenderness.  2+ bilateral lower extremitypitting edema to the knees. 2+ dorsalis pedis and posterior tibial pulses bilaterally. No asymmetry or calf tenderness. No erythema  Neurological: She is alert and oriented to person, place, and time.  Skin: Skin is warm and dry. No rash noted. No erythema.  Psychiatric: She has a normal mood and affect. Her behavior is normal.  Nursing note and vitals reviewed.   ED Course  Procedures (including critical care time) Labs Review Labs Reviewed - No data to display  Imaging Review No results found. I have personally reviewed and evaluated  these images and lab results as part of my medical decision-making.   EKG Interpretation None      MDM   Final diagnoses:  Peripheral edema    Advised to decrease fluid intake. Also it should decrease salt intake. Keep elevated and wear compression stockings. Will give short supply of PRN Lasix. Advised to follow-up with her primary physician. Return precautions given.    Julianne Rice, MD 02/19/16 2027

## 2016-02-19 NOTE — ED Notes (Addendum)
Bilateral leg swelling ad tightness from knee down, bilateral DP pulses +3, warm and color WNL

## 2016-02-19 NOTE — Discharge Instructions (Signed)

## 2016-03-13 ENCOUNTER — Emergency Department (HOSPITAL_COMMUNITY)
Admission: EM | Admit: 2016-03-13 | Discharge: 2016-03-13 | Disposition: A | Discharge status: Home / Self Care | Payer: Self-pay | Attending: Emergency Medicine | Admitting: Emergency Medicine

## 2016-03-13 ENCOUNTER — Encounter (HOSPITAL_COMMUNITY): Payer: Self-pay | Admitting: Emergency Medicine

## 2016-03-13 DIAGNOSIS — E039 Hypothyroidism, unspecified: Secondary | ICD-10-CM | POA: Insufficient documentation

## 2016-03-13 DIAGNOSIS — M7989 Other specified soft tissue disorders: Secondary | ICD-10-CM | POA: Insufficient documentation

## 2016-03-13 DIAGNOSIS — Z79899 Other long term (current) drug therapy: Secondary | ICD-10-CM | POA: Insufficient documentation

## 2016-03-13 DIAGNOSIS — F329 Major depressive disorder, single episode, unspecified: Secondary | ICD-10-CM | POA: Insufficient documentation

## 2016-03-13 DIAGNOSIS — F17210 Nicotine dependence, cigarettes, uncomplicated: Secondary | ICD-10-CM | POA: Insufficient documentation

## 2016-03-13 LAB — CBC WITH DIFFERENTIAL/PLATELET
BASOS ABS: 0.1 10*3/uL (ref 0.0–0.1)
BASOS PCT: 1 %
EOS ABS: 0.8 10*3/uL — AB (ref 0.0–0.7)
Eosinophils Relative: 9 %
HCT: 36.9 % (ref 36.0–46.0)
Hemoglobin: 11.4 g/dL — ABNORMAL LOW (ref 12.0–15.0)
Lymphocytes Relative: 21 %
Lymphs Abs: 1.8 10*3/uL (ref 0.7–4.0)
MCH: 28.2 pg (ref 26.0–34.0)
MCHC: 30.9 g/dL (ref 30.0–36.0)
MCV: 91.3 fL (ref 78.0–100.0)
Monocytes Absolute: 0.5 10*3/uL (ref 0.1–1.0)
Monocytes Relative: 6 %
NEUTROS ABS: 5.3 10*3/uL (ref 1.7–7.7)
Neutrophils Relative %: 63 %
PLATELETS: 509 10*3/uL — AB (ref 150–400)
RBC: 4.04 MIL/uL (ref 3.87–5.11)
RDW: 16.3 % — AB (ref 11.5–15.5)
WBC: 8.4 10*3/uL (ref 4.0–10.5)

## 2016-03-13 LAB — BASIC METABOLIC PANEL
ANION GAP: 9 (ref 5–15)
BUN: 11 mg/dL (ref 6–20)
CALCIUM: 9.4 mg/dL (ref 8.9–10.3)
CO2: 25 mmol/L (ref 22–32)
Chloride: 104 mmol/L (ref 101–111)
Creatinine, Ser: 1.09 mg/dL — ABNORMAL HIGH (ref 0.44–1.00)
GFR, EST NON AFRICAN AMERICAN: 59 mL/min — AB (ref >60)
Glucose, Bld: 138 mg/dL — ABNORMAL HIGH (ref 65–99)
Potassium: 3.1 mmol/L — ABNORMAL LOW (ref 3.5–5.1)
Sodium: 138 mmol/L (ref 135–145)

## 2016-03-13 MED ORDER — VANCOMYCIN HCL IN DEXTROSE 1-5 GM/200ML-% IV SOLN
1000.0000 mg | Freq: Once | INTRAVENOUS | Status: AC
  Administered 2016-03-13: 1000 mg via INTRAVENOUS
  Filled 2016-03-13: qty 200

## 2016-03-13 MED ORDER — ENOXAPARIN SODIUM 120 MG/0.8ML ~~LOC~~ SOLN
1.0000 mg/kg | Freq: Once | SUBCUTANEOUS | Status: AC
  Administered 2016-03-13: 110 mg via SUBCUTANEOUS
  Filled 2016-03-13: qty 0.8

## 2016-03-13 MED ORDER — SULFAMETHOXAZOLE-TRIMETHOPRIM 800-160 MG PO TABS: 1.0000 | ORAL_TABLET | Freq: Two times a day (BID) | ORAL | Status: AC

## 2016-03-13 MED ORDER — POTASSIUM CHLORIDE CRYS ER 20 MEQ PO TBCR
40.0000 meq | EXTENDED_RELEASE_TABLET | Freq: Once | ORAL | Status: AC
  Administered 2016-03-13: 40 meq via ORAL
  Filled 2016-03-13: qty 2

## 2016-03-13 NOTE — ED Notes (Addendum)
Pt reports was seen several weeks ago for same. Pt reports continued swelling and reports BLE redness. Pt denies cp,sob. Pt reports finished lasix prescription and tried compression hose with no relief. Pt reports friend had a prescription of 80mg  of lasix and started taking that with potassium with no relief of swelling.

## 2016-03-13 NOTE — Discharge Instructions (Signed)
Follow up tomorrow to get your ultrasound of your leg

## 2016-03-13 NOTE — ED Notes (Signed)
Gave patient a diet coke as per Dr. Roderic Palau.

## 2016-03-14 ENCOUNTER — Ambulatory Visit (HOSPITAL_COMMUNITY)
Admission: RE | Admit: 2016-03-14 | Discharge: 2016-03-14 | Disposition: A | Discharge status: Home / Self Care | Payer: Self-pay | Source: Ambulatory Visit | Attending: Emergency Medicine | Admitting: Emergency Medicine

## 2016-03-14 DIAGNOSIS — M7989 Other specified soft tissue disorders: Secondary | ICD-10-CM | POA: Insufficient documentation

## 2016-03-15 NOTE — ED Provider Notes (Signed)
CSN: [id number]     Arrival date & time 03/13/16  1651 History   First MD Initiated Contact with Patient 03/13/16 1846     Chief Complaint  Patient presents with  . Leg Swelling     (Consider location/radiation/quality/duration/timing/severity/associated sxs/prior Treatment) Patient is a 50 y.o. female presenting with leg pain. The history is provided by the patient (Patient complains of bilateral swelling to lower extremities. Also some redness and pain in right lower leg).  Leg Pain Location:  Leg Leg location:  L leg and R leg Pain details:    Quality:  Aching   Severity:  Moderate   Onset quality:  Gradual   Progression:  Worsening Associated symptoms: no back pain and no fatigue     Past Medical History  Diagnosis Date  . Depression   . Peptic ulcer   . Alcohol abuse   . Anemia   . Benzodiazepine abuse   . Thyroid disease   . Alcoholism ([place])   . Narcotic abuse   . Back pain   . Thrombocytopenia ([place]) 06/17/2011  . Hypothyroidism   . Seizures ([place])   . Hypoglycemia   . Suicide attempt ([place])     multiple times  . Anxiety    Past Surgical History  Procedure Laterality Date  . Cholecystectomy    . Abdominal surgery    . Esophagogastroduodenoscopy    . Gastric bypass    . Tubal ligation    . Esophagogastroduodenoscopy N/A 03/23/2013    Procedure: ESOPHAGOGASTRODUODENOSCOPY (EGD);  Surgeon: Ladene Artist, MD;  Location: [facility];  Service: Endoscopy;  Laterality: N/A;   Family History  Problem Relation Age of Onset  . Alcohol abuse Father   . Alcoholism Father   . Cancer Other    Social History  Substance Use Topics  . Smoking status: Current Every Day Smoker    Types: Cigarettes  . Smokeless tobacco: None  . Alcohol Use: Yes     Comment: Patient states she is sober for 76 days.   OB History    Gravida Para Term Preterm AB TAB SAB Ectopic Multiple Living   3 3 0 3 0 0 0 0       Review of Systems  Constitutional: Negative for appetite change and  fatigue.  HENT: Negative for congestion, ear discharge and sinus pressure.   Eyes: Negative for discharge.  Respiratory: Negative for cough.   Cardiovascular: Negative for chest pain.  Gastrointestinal: Negative for abdominal pain and diarrhea.  Genitourinary: Negative for frequency and hematuria.  Musculoskeletal: Negative for back pain.       Swelling in both lower legs  Skin: Negative for rash.  Neurological: Negative for seizures and headaches.  Psychiatric/Behavioral: Negative for hallucinations.      Allergies  Nsaids  Home Medications   Prior to Admission medications   Medication Sig Start Date End Date Taking? Authorizing Provider  amitriptyline (ELAVIL) 50 MG tablet Take 100 mg by mouth at bedtime.    Yes Historical Provider, MD  clonazePAM (KLONOPIN) 0.5 MG tablet Take 0.5 mg by mouth 3 (three) times daily.    Yes Historical Provider, MD  gabapentin (NEURONTIN) 400 MG capsule Take 2 capsules (800 mg total) by mouth 3 (three) times daily. For agitation Patient taking differently: Take 800 mg by mouth 3 (three) times daily as needed (pain). For agitation 10/07/15  Yes Encarnacion Slates, NP  levothyroxine (SYNTHROID, LEVOTHROID) 175 MCG tablet Take 1 tablet (175 mcg total) by mouth daily before breakfast.  For low thyroid function 10/12/15  Yes Verlee Monte, MD  norethindrone (MICRONOR,CAMILA,ERRIN) 0.35 MG tablet Take 1 tablet by mouth daily.   Yes Historical Provider, MD  sertraline (ZOLOFT) 100 MG tablet Take 1 tablet (100 mg total) by mouth daily. For depression Patient taking differently: Take 150 mg by mouth daily. For depression 10/07/15  Yes Encarnacion Slates, NP  topiramate (TOPAMAX) 25 MG tablet Take 1 tablet (25 mg total) by mouth 2 (two) times daily. 10/12/15  Yes Verlee Monte, MD  ciprofloxacin (CIPRO) 500 MG tablet Take 1 tablet (500 mg total) by mouth 2 (two) times daily. For 7 days Patient not taking: Reported on 03/13/2016 12/07/15   [name], PA-C  furosemide (LASIX)  20 MG tablet Take 1 tablet (20 mg total) by mouth daily as needed for edema. Patient not taking: Reported on 03/13/2016 02/19/16   Julianne Rice, MD  HYDROcodone-acetaminophen (NORCO/VICODIN) 5-325 MG tablet Take one-two tabs po q 4-6 hrs prn pain Patient not taking: Reported on 03/13/2016 12/07/15   [name], PA-C  hydrOXYzine (ATARAX/VISTARIL) 25 MG tablet Take 1 tablet (25 mg) four times daily as needed: For anxiety Patient not taking: Reported on 03/13/2016 10/07/15   Encarnacion Slates, NP  sulfamethoxazole-trimethoprim (BACTRIM DS,SEPTRA DS) 800-160 MG tablet Take 1 tablet by mouth 2 (two) times daily. 03/13/16 03/20/16  Milton Ferguson, MD   BP 88/44 mmHg  Pulse 76  Temp(Src) 98.5 F (36.9 C) (Oral)  Resp 17  Ht 5\' 9"  (1.753 m)  Wt 245 lb (111.131 kg)  BMI 36.16 kg/m2  SpO2 95%  LMP 02/17/2016 Physical Exam  Constitutional: She is oriented to person, place, and time. She appears well-developed.  HENT:  Head: Normocephalic.  Eyes: Conjunctivae are normal.  Neck: No tracheal deviation present.  Cardiovascular:  No murmur heard. Musculoskeletal: Normal range of motion.  Patient has bilateral 2+ edema from the knee distally. Also tenderness to right leg with redness  Neurological: She is oriented to person, place, and time.  Skin: Skin is warm.  Psychiatric: She has a normal mood and affect.    ED Course  Procedures (including critical care time) Labs Review Labs Reviewed  CBC WITH DIFFERENTIAL/PLATELET - Abnormal; Notable for the following:    Hemoglobin 11.4 (*)    RDW 16.3 (*)    Platelets 509 (*)    Eosinophils Absolute 0.8 (*)    All other components within normal limits  BASIC METABOLIC PANEL - Abnormal; Notable for the following:    Potassium 3.1 (*)    Glucose, Bld 138 (*)    Creatinine, Ser 1.09 (*)    GFR calc non Af Amer 59 (*)    All other components within normal limits    Imaging Review US Venous Img Lower Bilateral  03/14/2016  CLINICAL DATA:  50 year old  female with right greater than left lower extremity edema for 3 weeks. Right calf pain. Smoker. EXAM: BILATERAL LOWER EXTREMITY VENOUS DOPPLER ULTRASOUND TECHNIQUE: Gray-scale sonography with graded compression, as well as color Doppler and duplex ultrasound were performed to evaluate the lower extremity deep venous systems from the level of the common femoral vein and including the common femoral, femoral, profunda femoral, popliteal and calf veins including the posterior tibial, peroneal and gastrocnemius veins when visible. The superficial great saphenous vein was also interrogated. Spectral Doppler was utilized to evaluate flow at rest and with distal augmentation maneuvers in the common femoral, femoral and popliteal veins. COMPARISON:  None. FINDINGS: RIGHT LOWER EXTREMITY Common Femoral Vein: No  evidence of thrombus. Normal compressibility, respiratory phasicity and response to augmentation. Saphenofemoral Junction: No evidence of thrombus. Normal compressibility and flow on color Doppler imaging. Profunda Femoral Vein: No evidence of thrombus. Normal compressibility and flow on color Doppler imaging. Femoral Vein: No evidence of thrombus. Normal compressibility, respiratory phasicity and response to augmentation. Popliteal Vein: No evidence of thrombus. Normal compressibility, respiratory phasicity and response to augmentation. Calf Veins: No evidence of thrombus. Normal compressibility and flow on color Doppler imaging. Superficial Great Saphenous Vein: No evidence of thrombus. Normal compressibility and flow on color Doppler imaging. Venous Reflux:  None. Other Findings:  Right calf subcutaneous edema (image 27 and 28). LEFT LOWER EXTREMITY Common Femoral Vein: No evidence of thrombus. Normal compressibility, respiratory phasicity and response to augmentation. Saphenofemoral Junction: No evidence of thrombus. Normal compressibility and flow on color Doppler imaging. Profunda Femoral Vein: No evidence of  thrombus. Normal compressibility and flow on color Doppler imaging. Femoral Vein: No evidence of thrombus. Normal compressibility, respiratory phasicity and response to augmentation. Popliteal Vein: No evidence of thrombus. Normal compressibility, respiratory phasicity and response to augmentation. Calf Veins: No evidence of thrombus. Normal compressibility and flow on color Doppler imaging. Superficial Great Saphenous Vein: No evidence of thrombus. Normal compressibility and flow on color Doppler imaging. Venous Reflux:  None. Other Findings: Incidental bilateral normal inguinal lymph nodes with fatty hila. IMPRESSION: No evidence of deep venous thrombosis. Distal right lower extremity subcutaneous edema. Electronically Signed   By: Genevie Ann M.D.   On: 03/14/2016 11:18   I have personally reviewed and evaluated these images and lab results as part of my medical decision-making.   EKG Interpretation None      MDM   Final diagnoses:  Leg swelling    Patient with bilateral peripheral edema. Possible cellulitis right lower leg. Patient will be put on Bactrim given a shot of Lovenox and will get ultrasounds of her legs tomorrow.    Milton Ferguson, MD 03/15/16 [phone]

## 2016-03-19 ENCOUNTER — Encounter (HOSPITAL_COMMUNITY): Payer: Self-pay | Admitting: Emergency Medicine

## 2016-03-19 ENCOUNTER — Emergency Department (HOSPITAL_COMMUNITY)
Admission: EM | Admit: 2016-03-19 | Discharge: 2016-03-19 | Disposition: A | Discharge status: Home / Self Care | Payer: Self-pay | Attending: Emergency Medicine | Admitting: Emergency Medicine

## 2016-03-19 DIAGNOSIS — F1490 Cocaine use, unspecified, uncomplicated: Secondary | ICD-10-CM | POA: Insufficient documentation

## 2016-03-19 DIAGNOSIS — R609 Edema, unspecified: Secondary | ICD-10-CM | POA: Insufficient documentation

## 2016-03-19 DIAGNOSIS — F1110 Opioid abuse, uncomplicated: Secondary | ICD-10-CM | POA: Insufficient documentation

## 2016-03-19 DIAGNOSIS — E039 Hypothyroidism, unspecified: Secondary | ICD-10-CM | POA: Insufficient documentation

## 2016-03-19 DIAGNOSIS — Z792 Long term (current) use of antibiotics: Secondary | ICD-10-CM | POA: Insufficient documentation

## 2016-03-19 DIAGNOSIS — F17210 Nicotine dependence, cigarettes, uncomplicated: Secondary | ICD-10-CM | POA: Insufficient documentation

## 2016-03-19 DIAGNOSIS — Z79899 Other long term (current) drug therapy: Secondary | ICD-10-CM | POA: Insufficient documentation

## 2016-03-19 DIAGNOSIS — Z8669 Personal history of other diseases of the nervous system and sense organs: Secondary | ICD-10-CM | POA: Insufficient documentation

## 2016-03-19 DIAGNOSIS — F329 Major depressive disorder, single episode, unspecified: Secondary | ICD-10-CM | POA: Insufficient documentation

## 2016-03-19 LAB — COMPREHENSIVE METABOLIC PANEL
ALBUMIN: 4 g/dL (ref 3.5–5.0)
ALT: 20 U/L (ref 14–54)
ANION GAP: 7 (ref 5–15)
AST: 31 U/L (ref 15–41)
Alkaline Phosphatase: 87 U/L (ref 38–126)
BUN: 12 mg/dL (ref 6–20)
CHLORIDE: 105 mmol/L (ref 101–111)
CO2: 24 mmol/L (ref 22–32)
Calcium: 8.2 mg/dL — ABNORMAL LOW (ref 8.9–10.3)
Creatinine, Ser: 1.34 mg/dL — ABNORMAL HIGH (ref 0.44–1.00)
GFR calc Af Amer: 54 mL/min — ABNORMAL LOW (ref >60)
GFR calc non Af Amer: 46 mL/min — ABNORMAL LOW (ref >60)
GLUCOSE: 160 mg/dL — AB (ref 65–99)
POTASSIUM: 3.7 mmol/L (ref 3.5–5.1)
SODIUM: 136 mmol/L (ref 135–145)
TOTAL PROTEIN: 6.9 g/dL (ref 6.5–8.1)
Total Bilirubin: 0.3 mg/dL (ref 0.3–1.2)

## 2016-03-19 LAB — CBC WITH DIFFERENTIAL/PLATELET
BASOS ABS: 0 10*3/uL (ref 0.0–0.1)
BASOS PCT: 1 %
EOS ABS: 0.7 10*3/uL (ref 0.0–0.7)
Eosinophils Relative: 9 %
HCT: 28.4 % — ABNORMAL LOW (ref 36.0–46.0)
Hemoglobin: 9.3 g/dL — ABNORMAL LOW (ref 12.0–15.0)
Lymphocytes Relative: 21 %
Lymphs Abs: 1.6 10*3/uL (ref 0.7–4.0)
MCH: 29 pg (ref 26.0–34.0)
MCHC: 32.7 g/dL (ref 30.0–36.0)
MCV: 88.5 fL (ref 78.0–100.0)
MONO ABS: 1 10*3/uL (ref 0.1–1.0)
MONOS PCT: 13 %
NEUTROS ABS: 4.3 10*3/uL (ref 1.7–7.7)
Neutrophils Relative %: 56 %
PLATELETS: 457 10*3/uL — AB (ref 150–400)
RBC: 3.21 MIL/uL — ABNORMAL LOW (ref 3.87–5.11)
RDW: 16.7 % — AB (ref 11.5–15.5)
WBC: 7.6 10*3/uL (ref 4.0–10.5)

## 2016-03-19 LAB — BRAIN NATRIURETIC PEPTIDE: B Natriuretic Peptide: 39 pg/mL (ref 0.0–100.0)

## 2016-03-19 MED ORDER — HYDROCHLOROTHIAZIDE 25 MG PO TABS: 25.0000 mg | ORAL_TABLET | Freq: Every day | ORAL | Status: DC

## 2016-03-19 MED ORDER — POTASSIUM CHLORIDE ER 10 MEQ PO TBCR: 10.0000 meq | EXTENDED_RELEASE_TABLET | Freq: Every day | ORAL | Status: DC

## 2016-03-19 NOTE — ED Notes (Signed)
Pt states she has been having bilateral leg swelling for over a month.  States she has been evaluated multiple times for same.

## 2016-03-19 NOTE — Discharge Instructions (Signed)

## 2016-03-19 NOTE — ED Provider Notes (Signed)
CSN: [id number]     Arrival date & time 03/19/16  1251 History   First MD Initiated Contact with Patient 03/19/16 1608     Chief Complaint  Patient presents with  . Leg Swelling   HPI She presents to the emergency room for evaluation of persistent peripheral edema. Patient has a history of edema for the past couple of months now. She has been evaluated in the emergency department a few times. Patient's had laboratory tests as well as Doppler studies of her lower extremities that did not show any evidence of DVT. The last time the patient was here she was started on a course of Septra for possible cellulitis. Patient does not feel that helped. She has been taking a friend's Lasix and unfortunately the edema persists.  She has a history of alcoholism but stopped drinking several months ago. Continues to have edema. She denies any chest pain or shortness of breath. She was using compression stockings but they get too tight so she stopped. Patient is frustrated because she does not have a primary care doctor so she continues to return to the emergency room. Past Medical History  Diagnosis Date  . Depression   . Peptic ulcer   . Alcohol abuse   . Anemia   . Benzodiazepine abuse   . Thyroid disease   . Alcoholism ([place])   . Narcotic abuse   . Back pain   . Thrombocytopenia ([place]) 06/17/2011  . Hypothyroidism   . Seizures ([place])   . Hypoglycemia   . Suicide attempt ([place])     multiple times  . Anxiety    Past Surgical History  Procedure Laterality Date  . Cholecystectomy    . Abdominal surgery    . Esophagogastroduodenoscopy    . Gastric bypass    . Tubal ligation    . Esophagogastroduodenoscopy N/A 03/23/2013    Procedure: ESOPHAGOGASTRODUODENOSCOPY (EGD);  Surgeon: Ladene Artist, MD;  Location: [facility];  Service: Endoscopy;  Laterality: N/A;   Family History  Problem Relation Age of Onset  . Alcohol abuse Father   . Alcoholism Father   . Cancer Other    Social History   Substance Use Topics  . Smoking status: Current Every Day Smoker    Types: Cigarettes  . Smokeless tobacco: None  . Alcohol Use: Yes   OB History    Gravida Para Term Preterm AB TAB SAB Ectopic Multiple Living   3 3 0 3 0 0 0 0       Review of Systems  All other systems reviewed and are negative.     Allergies  Nsaids  Home Medications   Prior to Admission medications   Medication Sig Start Date End Date Taking? Authorizing Provider  amitriptyline (ELAVIL) 50 MG tablet Take 100 mg by mouth at bedtime.    Yes Historical Provider, MD  clonazePAM (KLONOPIN) 0.5 MG tablet Take 0.5 mg by mouth 3 (three) times daily.    Yes Historical Provider, MD  gabapentin (NEURONTIN) 400 MG capsule Take 2 capsules (800 mg total) by mouth 3 (three) times daily. For agitation Patient taking differently: Take 800 mg by mouth 3 (three) times daily as needed (pain). For agitation 10/07/15  Yes Encarnacion Slates, NP  levothyroxine (SYNTHROID, LEVOTHROID) 175 MCG tablet Take 1 tablet (175 mcg total) by mouth daily before breakfast. For low thyroid function 10/12/15  Yes Verlee Monte, MD  norethindrone (MICRONOR,CAMILA,ERRIN) 0.35 MG tablet Take 1 tablet by mouth daily.   Yes Historical Provider,  MD  omeprazole (PRILOSEC) 20 MG capsule Take 40 mg by mouth daily.   Yes Historical Provider, MD  sertraline (ZOLOFT) 100 MG tablet Take 1 tablet (100 mg total) by mouth daily. For depression Patient taking differently: Take 150 mg by mouth daily. For depression 10/07/15  Yes Sanjuana Kava, NP  sulfamethoxazole-trimethoprim (BACTRIM DS,SEPTRA DS) 800-160 MG tablet Take 1 tablet by mouth 2 (two) times daily. 03/13/16 03/20/16 Yes Bethann Berkshire, MD  topiramate (TOPAMAX) 25 MG tablet Take 1 tablet (25 mg total) by mouth 2 (two) times daily. 10/12/15  Yes Clydia Llano, MD  hydrochlorothiazide (HYDRODIURIL) 25 MG tablet Take 1 tablet (25 mg total) by mouth daily. 03/19/16   Linwood Dibbles, MD  potassium chloride (K-DUR) 10 MEQ tablet  Take 1 tablet (10 mEq total) by mouth daily. 03/19/16   Linwood Dibbles, MD   BP 111/59 mmHg  Pulse 66  Temp(Src) 97.5 F (36.4 C) (Oral)  Resp 18  SpO2 96%  LMP 02/17/2016 Physical Exam  Constitutional: She appears well-developed and well-nourished. No distress.  HENT:  Head: Normocephalic and atraumatic.  Right Ear: External ear normal.  Left Ear: External ear normal.  Eyes: Conjunctivae are normal. Right eye exhibits no discharge. Left eye exhibits no discharge. No scleral icterus.  Neck: Neck supple. No tracheal deviation present.  Cardiovascular: Normal rate, regular rhythm and intact distal pulses.   Pulmonary/Chest: Effort normal and breath sounds normal. No stridor. No respiratory distress. She has no wheezes. She has no rales.  Abdominal: Soft. Bowel sounds are normal. She exhibits no distension. There is no tenderness. There is no rebound and no guarding.  Musculoskeletal: She exhibits edema. She exhibits no tenderness.  Bilateral pitting edema, some clear serous drainage from a small wound on the right lower extremity, no increased warmth, faint erythema of the right lower extremity compared to the left  Neurological: She is alert. She has normal strength. No cranial nerve deficit (no facial droop, extraocular movements intact, no slurred speech) or sensory deficit. She exhibits normal muscle tone. She displays no seizure activity. Coordination normal.  Skin: Skin is warm and dry. No rash noted.  Psychiatric: She has a normal mood and affect.  Nursing note and vitals reviewed.   ED Course  Procedures (including critical care time) Labs Review Labs Reviewed  CBC WITH DIFFERENTIAL/PLATELET - Abnormal; Notable for the following:    RBC 3.21 (*)    Hemoglobin 9.3 (*)    HCT 28.4 (*)    RDW 16.7 (*)    Platelets 457 (*)    All other components within normal limits  COMPREHENSIVE METABOLIC PANEL - Abnormal; Notable for the following:    Glucose, Bld 160 (*)    Creatinine, Ser  1.34 (*)    Calcium 8.2 (*)    GFR calc non Af Amer 46 (*)    GFR calc Af Amer 54 (*)    All other components within normal limits  BRAIN NATRIURETIC PEPTIDE     MDM   Final diagnoses:  Peripheral edema    I doubt that the patient has cellulitis or infection. She has a normal white blood cell count. This has been going on for months.    Patient had recent Doppler studies that were negative.  She has a history of thyroid disease but she states that she was told about a month ago that her thyroid panel was normal.  The patient appears to have chronic lymphedema. It's possible her previous alcohol use is a contributing factor.  There does not appear to be any acute emergency condition associated with her edema.  I recommend that she continue compression stockings. I will give her a prescription for hydrochlorothiazide and potassium. I encouraged follow-up with a primary care doctor or the health department so they can consider any further testing that might be necessary and continue to manage her peripheral edema.    Dorie Rank, MD 03/19/16 [phone]

## 2016-03-30 ENCOUNTER — Emergency Department (HOSPITAL_COMMUNITY)
Admission: EM | Admit: 2016-03-30 | Discharge: 2016-03-30 | Disposition: A | Discharge status: Home / Self Care | Payer: Self-pay | Attending: Emergency Medicine | Admitting: Emergency Medicine

## 2016-03-30 ENCOUNTER — Other Ambulatory Visit: Payer: Self-pay

## 2016-03-30 ENCOUNTER — Encounter (HOSPITAL_COMMUNITY): Payer: Self-pay | Admitting: Emergency Medicine

## 2016-03-30 ENCOUNTER — Emergency Department (HOSPITAL_COMMUNITY): Payer: Self-pay

## 2016-03-30 DIAGNOSIS — F17210 Nicotine dependence, cigarettes, uncomplicated: Secondary | ICD-10-CM | POA: Insufficient documentation

## 2016-03-30 DIAGNOSIS — Z79899 Other long term (current) drug therapy: Secondary | ICD-10-CM | POA: Insufficient documentation

## 2016-03-30 DIAGNOSIS — R072 Precordial pain: Secondary | ICD-10-CM | POA: Insufficient documentation

## 2016-03-30 DIAGNOSIS — E876 Hypokalemia: Secondary | ICD-10-CM

## 2016-03-30 DIAGNOSIS — F329 Major depressive disorder, single episode, unspecified: Secondary | ICD-10-CM | POA: Insufficient documentation

## 2016-03-30 DIAGNOSIS — K2970 Gastritis, unspecified, without bleeding: Secondary | ICD-10-CM

## 2016-03-30 DIAGNOSIS — R079 Chest pain, unspecified: Secondary | ICD-10-CM

## 2016-03-30 DIAGNOSIS — E039 Hypothyroidism, unspecified: Secondary | ICD-10-CM | POA: Insufficient documentation

## 2016-03-30 LAB — COMPREHENSIVE METABOLIC PANEL
ALT: 23 U/L (ref 14–54)
ANION GAP: 9 (ref 5–15)
AST: 21 U/L (ref 15–41)
Albumin: 4 g/dL (ref 3.5–5.0)
Alkaline Phosphatase: 93 U/L (ref 38–126)
BUN: 13 mg/dL (ref 6–20)
CHLORIDE: 101 mmol/L (ref 101–111)
CO2: 26 mmol/L (ref 22–32)
Calcium: 8.9 mg/dL (ref 8.9–10.3)
Creatinine, Ser: 0.84 mg/dL (ref 0.44–1.00)
GFR calc non Af Amer: >60 mL/min (ref >60)
Glucose, Bld: 99 mg/dL (ref 65–99)
POTASSIUM: 2.6 mmol/L — AB (ref 3.5–5.1)
SODIUM: 136 mmol/L (ref 135–145)
Total Bilirubin: 0.7 mg/dL (ref 0.3–1.2)
Total Protein: 7.1 g/dL (ref 6.5–8.1)

## 2016-03-30 LAB — CBC WITH DIFFERENTIAL/PLATELET
Basophils Absolute: 0.1 10*3/uL (ref 0.0–0.1)
Basophils Relative: 1 %
EOS ABS: 0.3 10*3/uL (ref 0.0–0.7)
EOS PCT: 6 %
HCT: 31.8 % — ABNORMAL LOW (ref 36.0–46.0)
Hemoglobin: 10.1 g/dL — ABNORMAL LOW (ref 12.0–15.0)
LYMPHS ABS: 1.7 10*3/uL (ref 0.7–4.0)
Lymphocytes Relative: 31 %
MCH: 28.4 pg (ref 26.0–34.0)
MCHC: 31.8 g/dL (ref 30.0–36.0)
MCV: 89.3 fL (ref 78.0–100.0)
Monocytes Absolute: 0.7 10*3/uL (ref 0.1–1.0)
Monocytes Relative: 13 %
Neutro Abs: 2.7 10*3/uL (ref 1.7–7.7)
Neutrophils Relative %: 49 %
PLATELETS: 352 10*3/uL (ref 150–400)
RBC: 3.56 MIL/uL — AB (ref 3.87–5.11)
RDW: 16.3 % — ABNORMAL HIGH (ref 11.5–15.5)
WBC: 5.4 10*3/uL (ref 4.0–10.5)

## 2016-03-30 LAB — TROPONIN I: Troponin I: <0.03 ng/mL (ref <0.031)

## 2016-03-30 LAB — LIPASE, BLOOD: LIPASE: 11 U/L (ref 11–51)

## 2016-03-30 MED ORDER — POTASSIUM CHLORIDE CRYS ER 20 MEQ PO TBCR
40.0000 meq | EXTENDED_RELEASE_TABLET | Freq: Once | ORAL | Status: AC
  Administered 2016-03-30: 40 meq via ORAL
  Filled 2016-03-30: qty 2

## 2016-03-30 MED ORDER — SUCRALFATE 1 GM/10ML PO SUSP: 1.0000 g | Freq: Three times a day (TID) | ORAL | Status: DC

## 2016-03-30 MED ORDER — GI COCKTAIL ~~LOC~~
30.0000 mL | Freq: Once | ORAL | Status: AC
  Administered 2016-03-30: 30 mL via ORAL
  Filled 2016-03-30: qty 30

## 2016-03-30 MED ORDER — IOPAMIDOL (ISOVUE-370) INJECTION 76%
100.0000 mL | Freq: Once | INTRAVENOUS | Status: AC | PRN
  Administered 2016-03-30: 100 mL via INTRAVENOUS

## 2016-03-30 MED ORDER — ONDANSETRON HCL 4 MG/2ML IJ SOLN
4.0000 mg | Freq: Once | INTRAMUSCULAR | Status: AC
  Administered 2016-03-30: 4 mg via INTRAVENOUS
  Filled 2016-03-30: qty 2

## 2016-03-30 MED ORDER — POTASSIUM CHLORIDE CRYS ER 20 MEQ PO TBCR: 40.0000 meq | EXTENDED_RELEASE_TABLET | Freq: Every day | ORAL | Status: DC

## 2016-03-30 MED ORDER — HYDROMORPHONE HCL 1 MG/ML IJ SOLN
1.0000 mg | Freq: Once | INTRAMUSCULAR | Status: AC
  Administered 2016-03-30: 1 mg via INTRAVENOUS
  Filled 2016-03-30: qty 1

## 2016-03-30 MED ORDER — RANITIDINE HCL 150 MG PO TABS: 150.0000 mg | ORAL_TABLET | Freq: Two times a day (BID) | ORAL | Status: DC

## 2016-03-30 NOTE — Discharge Instructions (Signed)
Stop taking hydrochlorothiazide. This is causing your potassium to drop.  Gastritis, Adult Gastritis is soreness and swelling (inflammation) of the lining of the stomach. Gastritis can develop as a sudden onset (acute) or long-term (chronic) condition. If gastritis is not treated, it can lead to stomach bleeding and ulcers. CAUSES  Gastritis occurs when the stomach lining is weak or damaged. Digestive juices from the stomach then inflame the weakened stomach lining. The stomach lining may be weak or damaged due to viral or bacterial infections. One common bacterial infection is the Helicobacter pylori infection. Gastritis can also result from excessive alcohol consumption, taking certain medicines, or having too much acid in the stomach.  SYMPTOMS  In some cases, there are no symptoms. When symptoms are present, they may include:  Pain or a burning sensation in the upper abdomen.  Nausea.  Vomiting.  An uncomfortable feeling of fullness after eating. DIAGNOSIS  Your caregiver may suspect you have gastritis based on your symptoms and a physical exam. To determine the cause of your gastritis, your caregiver may perform the following:  Blood or stool tests to check for the H pylori bacterium.  Gastroscopy. A thin, flexible tube (endoscope) is passed down the esophagus and into the stomach. The endoscope has a light and camera on the end. Your caregiver uses the endoscope to view the inside of the stomach.  Taking a tissue sample (biopsy) from the stomach to examine under a microscope. TREATMENT  Depending on the cause of your gastritis, medicines may be prescribed. If you have a bacterial infection, such as an H pylori infection, antibiotics may be given. If your gastritis is caused by too much acid in the stomach, H2 blockers or antacids may be given. Your caregiver may recommend that you stop taking aspirin, ibuprofen, or other nonsteroidal anti-inflammatory drugs (NSAIDs). HOME CARE  INSTRUCTIONS  Only take over-the-counter or prescription medicines as directed by your caregiver.  If you were given antibiotic medicines, take them as directed. Finish them even if you start to feel better.  Drink enough fluids to keep your urine clear or pale yellow.  Avoid foods and drinks that make your symptoms worse, such as:  Caffeine or alcoholic drinks.  Chocolate.  Peppermint or mint flavorings.  Garlic and onions.  Spicy foods.  Citrus fruits, such as oranges, lemons, or limes.  Tomato-based foods such as sauce, chili, salsa, and pizza.  Fried and fatty foods.  Eat small, frequent meals instead of large meals. SEEK IMMEDIATE MEDICAL CARE IF:   You have black or dark red stools.  You vomit blood or material that looks like coffee grounds.  You are unable to keep fluids down.  Your abdominal pain gets worse.  You have a fever.  You do not feel better after 1 week.  You have any other questions or concerns. MAKE SURE YOU:  Understand these instructions.  Will watch your condition.  Will get help right away if you are not doing well or get worse.   This information is not intended to replace advice given to you by your health care provider. Make sure you discuss any questions you have with your health care provider.   Document Released: 09/18/2001 Document Revised: 03/25/2012 Document Reviewed: 11/07/2011 Elsevier Interactive Patient Education 2016 [employer] Inc[employer]  Nonspecific Chest Pain  Chest pain can be caused by many different conditions. There is always a chance that your pain could be related to something serious, such as a heart attack or a blood clot in your  lungs. Chest pain can also be caused by conditions that are not life-threatening. If you have chest pain, it is very important to follow up with your health care provider. CAUSES  Chest pain can be caused by:  Heartburn.  Pneumonia or bronchitis.  Anxiety or stress.  Inflammation  around your heart (pericarditis) or lung (pleuritis or pleurisy).  A blood clot in your lung.  A collapsed lung (pneumothorax). It can develop suddenly on its own (spontaneous pneumothorax) or from trauma to the chest.  Shingles infection (varicella-zoster virus).  Heart attack.  Damage to the bones, muscles, and cartilage that make up your chest wall. This can include:  Bruised bones due to injury.  Strained muscles or cartilage due to frequent or repeated coughing or overwork.  Fracture to one or more ribs.  Sore cartilage due to inflammation (costochondritis). RISK FACTORS  Risk factors for chest pain may include:  Activities that increase your risk for trauma or injury to your chest.  Respiratory infections or conditions that cause frequent coughing.  Medical conditions or overeating that can cause heartburn.  Heart disease or family history of heart disease.  Conditions or health behaviors that increase your risk of developing a blood clot.  Having had chicken pox (varicella zoster). SIGNS AND SYMPTOMS Chest pain can feel like:  Burning or tingling on the surface of your chest or deep in your chest.  Crushing, pressure, aching, or squeezing pain.  Dull or sharp pain that is worse when you move, cough, or take a deep breath.  Pain that is also felt in your back, neck, shoulder, or arm, or pain that spreads to any of these areas. Your chest pain may come and go, or it may stay constant. DIAGNOSIS Lab tests or other studies may be needed to find the cause of your pain. Your health care provider may have you take a test called an ambulatory ECG (electrocardiogram). An ECG records your heartbeat patterns at the time the test is performed. You may also have other tests, such as:  Transthoracic echocardiogram (TTE). During echocardiography, sound waves are used to create a picture of all of the heart structures and to look at how blood flows through your  heart.  Transesophageal echocardiogram (TEE).This is a more advanced imaging test that obtains images from inside your body. It allows your health care provider to see your heart in finer detail.  Cardiac monitoring. This allows your health care provider to monitor your heart rate and rhythm in real time.  Holter monitor. This is a portable device that records your heartbeat and can help to diagnose abnormal heartbeats. It allows your health care provider to track your heart activity for several days, if needed.  Stress tests. These can be done through exercise or by taking medicine that makes your heart beat more quickly.  Blood tests.  Imaging tests. TREATMENT  Your treatment depends on what is causing your chest pain. Treatment may include:  Medicines. These may include:  Acid blockers for heartburn.  Anti-inflammatory medicine.  Pain medicine for inflammatory conditions.  Antibiotic medicine, if an infection is present.  Medicines to dissolve blood clots.  Medicines to treat coronary artery disease.  Supportive care for conditions that do not require medicines. This may include:  Resting.  Applying heat or cold packs to injured areas.  Limiting activities until pain decreases. HOME CARE INSTRUCTIONS  If you were prescribed an antibiotic medicine, finish it all even if you start to feel better.  Avoid any activities  that bring on chest pain.  Do not use any tobacco products, including cigarettes, chewing tobacco, or electronic cigarettes. If you need help quitting, ask your health care provider.  Do not drink alcohol.  Take medicines only as directed by your health care provider.  Keep all follow-up visits as directed by your health care provider. This is important. This includes any further testing if your chest pain does not go away.  If heartburn is the cause for your chest pain, you may be told to keep your head raised (elevated) while sleeping. This reduces  the chance that acid will go from your stomach into your esophagus.  Make lifestyle changes as directed by your health care provider. These may include:  Getting regular exercise. Ask your health care provider to suggest some activities that are safe for you.  Eating a heart-healthy diet. A registered dietitian can help you to learn healthy eating options.  Maintaining a healthy weight.  Managing diabetes, if necessary.  Reducing stress. SEEK MEDICAL CARE IF:  Your chest pain does not go away after treatment.  You have a rash with blisters on your chest.  You have a fever. SEEK IMMEDIATE MEDICAL CARE IF:   Your chest pain is worse.  You have an increasing cough, or you cough up blood.  You have severe abdominal pain.  You have severe weakness.  You faint.  You have chills.  You have sudden, unexplained chest discomfort.  You have sudden, unexplained discomfort in your arms, back, neck, or jaw.  You have shortness of breath at any time.  You suddenly start to sweat, or your skin gets clammy.  You feel nauseous or you vomit.  You suddenly feel light-headed or dizzy.  Your heart begins to beat quickly, or it feels like it is skipping beats. These symptoms may represent a serious problem that is an emergency. Do not wait to see if the symptoms will go away. Get medical help right away. Call your local emergency services (911 in the [place] Do not drive yourself to the hospital.   This information is not intended to replace advice given to you by your health care provider. Make sure you discuss any questions you have with your health care provider.   Document Released: 07/04/2005 Document Revised: 10/15/2014 Document Reviewed: 04/30/2014 Elsevier Interactive Patient Education 2016 [employer].  Hypokalemia Hypokalemia means that the amount of potassium in the blood is lower than normal.Potassium is a chemical, called an electrolyte, that helps regulate the amount  of fluid in the body. It also stimulates muscle contraction and helps nerves function properly.Most of the body's potassium is inside of cells, and only a very small amount is in the blood. Because the amount in the blood is so small, minor changes can be life-threatening. CAUSES  Antibiotics.  Diarrhea or vomiting.  Using laxatives too much, which can cause diarrhea.  Chronic kidney disease.  Water pills (diuretics).  Eating disorders (bulimia).  Low magnesium level.  Sweating a lot. SIGNS AND SYMPTOMS  Weakness.  Constipation.  Fatigue.  Muscle cramps.  Mental confusion.  Skipped heartbeats or irregular heartbeat (palpitations).  Tingling or numbness. DIAGNOSIS  Your health care provider can diagnose hypokalemia with blood tests. In addition to checking your potassium level, your health care provider may also check other lab tests. TREATMENT Hypokalemia can be treated with potassium supplements taken by mouth or adjustments in your current medicines. If your potassium level is very low, you may need to get potassium through a  vein (IV) and be monitored in the hospital. A diet high in potassium is also helpful. Foods high in potassium are:  Nuts, such as peanuts and pistachios.  Seeds, such as sunflower seeds and pumpkin seeds.  Peas, lentils, and lima beans.  Whole grain and bran cereals and breads.  Fresh fruit and vegetables, such as apricots, avocado, bananas, cantaloupe, kiwi, oranges, tomatoes, asparagus, and potatoes.  Orange and tomato juices.  Red meats.  Fruit yogurt. HOME CARE INSTRUCTIONS  Take all medicines as prescribed by your health care provider.  Maintain a healthy diet by including nutritious food, such as fruits, vegetables, nuts, whole grains, and lean meats.  If you are taking a laxative, be sure to follow the directions on the label. SEEK MEDICAL CARE IF:  Your weakness gets worse.  You feel your heart pounding or racing.  You  are vomiting or having diarrhea.  You are diabetic and having trouble keeping your blood glucose in the normal range. SEEK IMMEDIATE MEDICAL CARE IF:  You have chest pain, shortness of breath, or dizziness.  You are vomiting or having diarrhea for more than 2 days.  You faint. MAKE SURE YOU:   Understand these instructions.  Will watch your condition.  Will get help right away if you are not doing well or get worse.   This information is not intended to replace advice given to you by your health care provider. Make sure you discuss any questions you have with your health care provider.   Document Released: 09/24/2005 Document Revised: 10/15/2014 Document Reviewed: 03/27/2013 Elsevier Interactive Patient Education [employer].

## 2016-03-30 NOTE — ED Notes (Signed)
CRITICAL VALUE ALERT  Critical value received:  Potassium 2.6  Date of notification:  03/30/16  Time of notification:  [id number] hrs  Critical value read back:Yes.    Nurse who received alert:  Y. Shron Ozer, RN  Responding MD:  Dr. Betsey Holiday  Time MD responded:  0429 hrs

## 2016-03-30 NOTE — ED Notes (Signed)
EDP at bedside updating patient. 

## 2016-03-30 NOTE — ED Provider Notes (Addendum)
CSN: [id number]     Arrival date & time 03/30/16  0344 History   First MD Initiated Contact with Patient 03/30/16 [phone]     Chief Complaint  Patient presents with  . Chest Pain     (Consider location/radiation/quality/duration/timing/severity/associated sxs/prior Treatment) HPI Comments: Patient presents to the emergency department for evaluation of chest pain. Patient reports that she was awakened from sleep approximately 30 minutes prior to arrival with substernal chest pain. Patient reports constant and severe pain in the center of her chest that radiates into her back. There is no associated shortness of breath. Patient given aspirin by EMS without relief.  Patient is a 50 y.o. female presenting with chest pain.  Chest Pain   Past Medical History  Diagnosis Date  . Depression   . Peptic ulcer   . Alcohol abuse   . Anemia   . Benzodiazepine abuse   . Thyroid disease   . Alcoholism ([place])   . Narcotic abuse   . Back pain   . Thrombocytopenia ([place]) 06/17/2011  . Hypothyroidism   . Seizures ([place])   . Hypoglycemia   . Suicide attempt ([place])     multiple times  . Anxiety    Past Surgical History  Procedure Laterality Date  . Cholecystectomy    . Abdominal surgery    . Esophagogastroduodenoscopy    . Gastric bypass    . Tubal ligation    . Esophagogastroduodenoscopy N/A 03/23/2013    Procedure: ESOPHAGOGASTRODUODENOSCOPY (EGD);  Surgeon: Ladene Artist, MD;  Location: [facility];  Service: Endoscopy;  Laterality: N/A;   Family History  Problem Relation Age of Onset  . Alcohol abuse Father   . Alcoholism Father   . Cancer Other    Social History  Substance Use Topics  . Smoking status: Current Every Day Smoker    Types: Cigarettes  . Smokeless tobacco: None  . Alcohol Use: Yes   OB History    Gravida Para Term Preterm AB TAB SAB Ectopic Multiple Living   3 3 0 3 0 0 0 0       Review of Systems  Cardiovascular: Positive for chest pain.  All other systems  reviewed and are negative.     Allergies  Nsaids  Home Medications   Prior to Admission medications   Medication Sig Start Date End Date Taking? Authorizing Provider  amitriptyline (ELAVIL) 50 MG tablet Take 100 mg by mouth at bedtime.    Yes Historical Provider, MD  clonazePAM (KLONOPIN) 0.5 MG tablet Take 0.5 mg by mouth 3 (three) times daily.    Yes Historical Provider, MD  gabapentin (NEURONTIN) 400 MG capsule Take 2 capsules (800 mg total) by mouth 3 (three) times daily. For agitation Patient taking differently: Take 800 mg by mouth 3 (three) times daily as needed (pain). For agitation 10/07/15  Yes Encarnacion Slates, NP  hydrochlorothiazide (HYDRODIURIL) 25 MG tablet Take 1 tablet (25 mg total) by mouth daily. 03/19/16  Yes Dorie Rank, MD  levothyroxine (SYNTHROID, LEVOTHROID) 175 MCG tablet Take 1 tablet (175 mcg total) by mouth daily before breakfast. For low thyroid function 10/12/15  Yes Verlee Monte, MD  norethindrone (MICRONOR,CAMILA,ERRIN) 0.35 MG tablet Take 1 tablet by mouth daily.   Yes Historical Provider, MD  omeprazole (PRILOSEC) 20 MG capsule Take 40 mg by mouth daily.   Yes Historical Provider, MD  potassium chloride (K-DUR) 10 MEQ tablet Take 1 tablet (10 mEq total) by mouth daily. 03/19/16  Yes Dorie Rank, MD  sertraline (  ZOLOFT) 100 MG tablet Take 1 tablet (100 mg total) by mouth daily. For depression Patient taking differently: Take 150 mg by mouth daily. For depression 10/07/15  Yes Encarnacion Slates, NP  topiramate (TOPAMAX) 25 MG tablet Take 1 tablet (25 mg total) by mouth 2 (two) times daily. 10/12/15  Yes Mutaz Elmahi, MD   BP 95/59 mmHg  Pulse 68  Temp(Src) 98 F (36.7 C) (Oral)  Resp 18  Ht 5\' 9"  (1.753 m)  Wt 227 lb (102.967 kg)  BMI 33.51 kg/m2  SpO2 93%  LMP 03/16/2016 Physical Exam  Constitutional: She is oriented to person, place, and time. She appears well-developed and well-nourished. No distress.  HENT:  Head: Normocephalic and atraumatic.  Right Ear:  Hearing normal.  Left Ear: Hearing normal.  Nose: Nose normal.  Mouth/Throat: Oropharynx is clear and moist and mucous membranes are normal.  Eyes: Conjunctivae and EOM are normal. Pupils are equal, round, and reactive to light.  Neck: Normal range of motion. Neck supple.  Cardiovascular: Regular rhythm, S1 normal and S2 normal.  Exam reveals no gallop and no friction rub.   No murmur heard. Pulmonary/Chest: Effort normal and breath sounds normal. No respiratory distress. She exhibits no tenderness.  Abdominal: Soft. Normal appearance and bowel sounds are normal. There is no hepatosplenomegaly. There is no tenderness. There is no rebound, no guarding, no tenderness at McBurney's point and negative Murphy's sign. No hernia.  Musculoskeletal: Normal range of motion.  Neurological: She is alert and oriented to person, place, and time. She has normal strength. No cranial nerve deficit or sensory deficit. Coordination normal. GCS eye subscore is 4. GCS verbal subscore is 5. GCS motor subscore is 6.  Skin: Skin is warm, dry and intact. No rash noted. No cyanosis.  Psychiatric: She has a normal mood and affect. Her speech is normal and behavior is normal. Thought content normal.  Nursing note and vitals reviewed.   ED Course  Procedures (including critical care time) Labs Review Labs Reviewed  CBC WITH DIFFERENTIAL/PLATELET - Abnormal; Notable for the following:    RBC 3.56 (*)    Hemoglobin 10.1 (*)    HCT 31.8 (*)    RDW 16.3 (*)    All other components within normal limits  COMPREHENSIVE METABOLIC PANEL - Abnormal; Notable for the following:    Potassium 2.6 (*)    All other components within normal limits  TROPONIN I  LIPASE, BLOOD  TROPONIN I    Imaging Review Dg Chest 2 View  03/30/2016  CLINICAL DATA:  50 year old female with shortness of breath EXAM: CHEST  2 VIEW COMPARISON:  Chest radiograph dated 03/04/2014 FINDINGS: The heart size and mediastinal contours are within normal  limits. Both lungs are clear. The visualized skeletal structures are unremarkable. IMPRESSION: No active cardiopulmonary disease. Electronically Signed   By: Anner Crete M.D.   On: 03/30/2016 04:32   Ct Angio Chest/abd/pel For Dissection W And/or W/wo  03/30/2016  CLINICAL DATA:  50 year old female with chest pain radiating to bilateral lower abdomen. EXAM: CT CHEST, ABDOMEN, AND PELVIS WITH CONTRAST TECHNIQUE: Multidetector CT imaging of the chest, abdomen and pelvis was performed following the standard protocol during bolus administration of intravenous contrast. CONTRAST:  100 cc Isovue 370 COMPARISON:  None. FINDINGS: CT CHEST Minimal bibasilar dependent atelectatic changes of the lungs. The lungs are otherwise clear. There is no pleural effusion or pneumothorax. The central airways are patent. The thoracic aorta appears unremarkable. The origins of the great vessels of the  aortic arch are patent. No CT evidence of pulmonary embolism. There is no cardiomegaly or pericardial effusion. No hilar or mediastinal adenopathy. The esophagus is grossly unremarkable. There is no axillary adenopathy. The chest wall soft tissues appear unremarkable. The osseous structures are intact. CT ABDOMEN AND PELVIS No intra-abdominal free air. Trace free fluid within the pelvis, likely physiologic. Apparent diffuse fatty infiltration of the liver. Mild thyromegaly 18 cm in midclavicular length. Cholecystectomy. The pancreas, spleen, adrenal glands, kidneys, and the visualized ureters appear unremarkable. There is apparent diffuse thickening of the bladder wall which may be partly related to underdistention. Cystitis is not excluded. Correlation with urinalysis recommended. The uterus and ovaries are grossly unremarkable. There is postsurgical changes of gastric bypass. There is no evidence of bowel obstruction or active inflammation. There is moderate stool throughout the colon. Normal appendix. The abdominal aorta and IVC  appear unremarkable. The origins of the celiac axis, SMA, IMA as well as the origins of the renal arteries are patent. There is a classic celiac axis branching pattern. No portal venous gas identified. There is no adenopathy. There is diastases of anterior abdominal wall musculature in the midline with a small fat containing umbilical hernia. The abdominal wall soft tissues are otherwise unremarkable. The osseous structures are intact. L4-L5 degenerative changes with disc space narrowing and endplate irregularity. IMPRESSION: No acute intrathoracic, abdominal, or pelvic pathology. No CT evidence of pulmonary embolism or aortic dissection. Electronically Signed   By: Anner Crete M.D.   On: 03/30/2016 06:10   I have personally reviewed and evaluated these images and lab results as part of my medical decision-making.   EKG Interpretation   Date/Time:  Friday March 30 2016 03:50:45 EDT Ventricular Rate:  68 PR Interval:    QRS Duration: 112 QT Interval:  466 QTC Calculation: 496 R Axis:   69 Text Interpretation:  Sinus rhythm Borderline intraventricular conduction  delay Borderline prolonged QT interval No significant change since last  tracing Confirmed by POLLINA  MD, CHRISTOPHER [id number]) on 03/30/2016 4:42:35  AM      MDM   Final diagnoses:  Chest pain, unspecified chest pain type    Patient presents to the ER for evaluation of chest pain radiating into her back. She reports that she was awakened from sleep with this pain. Patient does not have any known coronary artery disease. EKG did not show evidence of ischemia or infarct. Troponin negative 2. Because of the pain moving into her back, CT angiography performed to rule out dissection. No acute abnormality noted in chest, abdomen, pelvis. Patient does have a history of alcohol abuse and did recently drink. No signs of pancreatitis. Symptoms likely secondary to gastritis and esophagitis. She is appropriate for outpatient management, no  vital sign instability or change in hemoglobin levels - at her baseline anemia.  Patient once again hypokalemic. Potassium is lower today than it was previously. This is likely secondary to thiazide diuretic. Will discontinue as patient's blood pressure is not hypertensive here in the ER.  Orpah Greek, MD 03/30/16 [place], MD 03/30/16 [phone]

## 2016-03-30 NOTE — ED Notes (Signed)
Patient started having chest pain approx 30 minutes ago.  Given 325 mg aspirin by EMS

## 2016-04-24 ENCOUNTER — Emergency Department (HOSPITAL_COMMUNITY)
Admission: EM | Admit: 2016-04-24 | Discharge: 2016-04-24 | Disposition: A | Discharge status: Home / Self Care | Payer: No Typology Code available for payment source | Attending: Emergency Medicine | Admitting: Emergency Medicine

## 2016-04-24 ENCOUNTER — Encounter (HOSPITAL_COMMUNITY): Payer: Self-pay

## 2016-04-24 DIAGNOSIS — Z79899 Other long term (current) drug therapy: Secondary | ICD-10-CM | POA: Insufficient documentation

## 2016-04-24 DIAGNOSIS — H6091 Unspecified otitis externa, right ear: Secondary | ICD-10-CM | POA: Insufficient documentation

## 2016-04-24 DIAGNOSIS — H66011 Acute suppurative otitis media with spontaneous rupture of ear drum, right ear: Secondary | ICD-10-CM | POA: Insufficient documentation

## 2016-04-24 DIAGNOSIS — E039 Hypothyroidism, unspecified: Secondary | ICD-10-CM | POA: Insufficient documentation

## 2016-04-24 DIAGNOSIS — F329 Major depressive disorder, single episode, unspecified: Secondary | ICD-10-CM | POA: Insufficient documentation

## 2016-04-24 DIAGNOSIS — F17210 Nicotine dependence, cigarettes, uncomplicated: Secondary | ICD-10-CM | POA: Insufficient documentation

## 2016-04-24 MED ORDER — HYDROCODONE-ACETAMINOPHEN 5-325 MG PO TABS: 1.0000 | ORAL_TABLET | Freq: Four times a day (QID) | ORAL | Status: DC | PRN

## 2016-04-24 MED ORDER — AMOXICILLIN 500 MG PO CAPS: 500.0000 mg | ORAL_CAPSULE | Freq: Three times a day (TID) | ORAL | Status: DC

## 2016-04-24 MED ORDER — POTASSIUM CHLORIDE CRYS ER 20 MEQ PO TBCR: 40.0000 meq | EXTENDED_RELEASE_TABLET | Freq: Every day | ORAL | Status: DC

## 2016-04-24 MED ORDER — CIPROFLOXACIN-DEXAMETHASONE 0.3-0.1 % OT SUSP
4.0000 [drp] | Freq: Two times a day (BID) | OTIC | Status: DC
  Administered 2016-04-24: 4 [drp] via OTIC
  Filled 2016-04-24: qty 7.5

## 2016-04-24 NOTE — ED Notes (Signed)
Pt c/o rt ear drainage and pain  Last took motrin at 0500

## 2016-04-24 NOTE — ED Provider Notes (Signed)
CSN: [id number]     Arrival date & time 04/24/16  1017 History   First MD Initiated Contact with Patient 04/24/16 1042     Chief Complaint  Patient presents with  . Otalgia     (Consider location/radiation/quality/duration/timing/severity/associated sxs/prior Treatment) Patient is a 50 y.o. female presenting with ear pain.  Otalgia Location:  Right Behind ear:  No abnormality Quality:  Aching and throbbing Severity:  Moderate Onset quality:  Gradual Duration:  1 month Timing:  Constant Progression:  Worsening Chronicity:  New Context: not direct blow   Relieved by:  Palpation and position Worsened by:  Nothing tried Ineffective treatments:  OTC medications Associated symptoms: ear discharge   Associated symptoms: no abdominal pain, no congestion, no cough, no diarrhea, no fever, no headaches, no neck pain, no rash, no rhinorrhea and no vomiting   Risk factors: no prior ear surgery     Past Medical History  Diagnosis Date  . Depression   . Peptic ulcer   . Alcohol abuse   . Anemia   . Benzodiazepine abuse   . Thyroid disease   . Alcoholism ([place])   . Narcotic abuse   . Back pain   . Thrombocytopenia ([place]) 06/17/2011  . Hypothyroidism   . Seizures ([place])   . Hypoglycemia   . Suicide attempt ([place])     multiple times  . Anxiety    Past Surgical History  Procedure Laterality Date  . Cholecystectomy    . Abdominal surgery    . Esophagogastroduodenoscopy    . Gastric bypass    . Tubal ligation    . Esophagogastroduodenoscopy N/A 03/23/2013    Procedure: ESOPHAGOGASTRODUODENOSCOPY (EGD);  Surgeon: Ladene Artist, MD;  Location: [facility];  Service: Endoscopy;  Laterality: N/A;   Family History  Problem Relation Age of Onset  . Alcohol abuse Father   . Alcoholism Father   . Cancer Other    Social History  Substance Use Topics  . Smoking status: Current Every Day Smoker -- 0.50 packs/day    Types: Cigarettes  . Smokeless tobacco: None  . Alcohol Use: Yes    OB History    Gravida Para Term Preterm AB TAB SAB Ectopic Multiple Living   3 3 0 3 0 0 0 0       Review of Systems  Constitutional: Negative for fever, chills and fatigue.  HENT: Positive for ear discharge and ear pain. Negative for congestion, rhinorrhea, trouble swallowing and voice change.   Respiratory: Negative for cough.   Cardiovascular: Negative for chest pain and leg swelling.  Gastrointestinal: Negative for vomiting, abdominal pain and diarrhea.  Genitourinary: Negative for dysuria and flank pain.  Musculoskeletal: Negative for neck pain and neck stiffness.  Skin: Negative for rash.  Allergic/Immunologic: Negative for immunocompromised state.  Neurological: Negative for syncope, weakness and headaches.      Allergies  Nsaids  Home Medications   Prior to Admission medications   Medication Sig Start Date End Date Taking? Authorizing Provider  amitriptyline (ELAVIL) 50 MG tablet Take 100 mg by mouth at bedtime as needed for sleep.    Yes Historical Provider, MD  clonazePAM (KLONOPIN) 0.5 MG tablet Take 0.5 mg by mouth 3 (three) times daily.    Yes Historical Provider, MD  gabapentin (NEURONTIN) 400 MG capsule Take 2 capsules (800 mg total) by mouth 3 (three) times daily. For agitation Patient taking differently: Take 800 mg by mouth 3 (three) times daily as needed (pain). For agitation 10/07/15  Yes Herbert Pun  I Nwoko, NP  hydrochlorothiazide (HYDRODIURIL) 25 MG tablet Take 25 mg by mouth daily.   Yes Historical Provider, MD  levothyroxine (SYNTHROID, LEVOTHROID) 175 MCG tablet Take 1 tablet (175 mcg total) by mouth daily before breakfast. For low thyroid function 10/12/15  Yes Verlee Monte, MD  norethindrone (MICRONOR,CAMILA,ERRIN) 0.35 MG tablet Take 1 tablet by mouth daily.   Yes Historical Provider, MD  omeprazole (PRILOSEC) 20 MG capsule Take 40 mg by mouth daily.   Yes Historical Provider, MD  sertraline (ZOLOFT) 100 MG tablet Take 1 tablet (100 mg total) by mouth  daily. For depression Patient taking differently: Take 150 mg by mouth daily. For depression 10/07/15  Yes Encarnacion Slates, NP  topiramate (TOPAMAX) 25 MG tablet Take 1 tablet (25 mg total) by mouth 2 (two) times daily. 10/12/15  Yes Verlee Monte, MD  amoxicillin (AMOXIL) 500 MG capsule Take 1 capsule (500 mg total) by mouth 3 (three) times daily. 04/24/16   Duffy Bruce, MD  HYDROcodone-acetaminophen (NORCO/VICODIN) 5-325 MG tablet Take 1 tablet by mouth every 6 (six) hours as needed for severe pain. 04/24/16   Duffy Bruce, MD  potassium chloride SA (K-DUR,KLOR-CON) 20 MEQ tablet Take 2 tablets (40 mEq total) by mouth daily. 04/24/16   Duffy Bruce, MD  ranitidine (ZANTAC) 150 MG tablet Take 1 tablet (150 mg total) by mouth 2 (two) times daily. Patient not taking: Reported on 04/24/2016 03/30/16   Orpah Greek, MD  sucralfate (CARAFATE) 1 GM/10ML suspension Take 10 mLs (1 g total) by mouth 4 (four) times daily -  with meals and at bedtime. Patient not taking: Reported on 04/24/2016 03/30/16   Orpah Greek, MD   BP 129/76 mmHg  Pulse 58  Temp(Src) 97.6 F (36.4 C) (Oral)  Resp 16  Ht 5\' 9"  (1.753 m)  Wt 225 lb (102.059 kg)  BMI 33.21 kg/m2  SpO2 100%  LMP 03/06/2016 Physical Exam  Constitutional: She is oriented to person, place, and time. She appears well-developed and well-nourished. No distress.  HENT:  Head: Normocephalic.  Right Ear: Hearing normal. There is swelling. No mastoid tenderness. Tympanic membrane is injected, erythematous and bulging. A middle ear effusion is present.  Left Ear: Hearing and ear canal normal. No mastoid tenderness. Tympanic membrane is not injected and not erythematous.  No middle ear effusion.  Right ear with bulging, opaque, erythematous TM with purulent discharge in EAC. EAC mildly edematous but patent. Mild TTP of tragus and with external manipulation of ear. No TTP, swelling, or erythema over mastoid. TM appears largely intact.   Eyes:  Conjunctivae are normal. Pupils are equal, round, and reactive to light.  Neck: Normal range of motion. Neck supple.  Cardiovascular: Normal rate, normal heart sounds and intact distal pulses.  Exam reveals no friction rub.   No murmur heard. Pulmonary/Chest: Effort normal and breath sounds normal. No respiratory distress. She has no wheezes. She has no rales.  Abdominal: Soft. She exhibits no distension. There is no tenderness.  Musculoskeletal: She exhibits no edema.  Lymphadenopathy:       Head (right side): Submandibular adenopathy present.    She has cervical adenopathy.       Right cervical: Superficial cervical adenopathy present.  Neurological: She is alert and oriented to person, place, and time.  Skin: Skin is warm. No rash noted.  Nursing note and vitals reviewed.   ED Course  Procedures (including critical care time) Labs Review Labs Reviewed - No data to display  Imaging Review No  results found. I have personally reviewed and evaluated these images and lab results as part of my medical decision-making.   EKG Interpretation None      MDM  50 yo F with no significant PMHx who p/w a 30-month h/o right ear pain and drainage. Exam as above, consistent with acute, suppurative OM with possible spontaneous TM rupture versus concomitant otitis externa, given TTP of external ear and drainage. There is no mastoid tenderness, redness, or swelling and pt is well-appearing, not diabetic or immune suppressed, and in NAD - do not suspect mastoiditis. She has tender cervical LAD likely 2/2 AOM but no neck stiffness or s/s meningitis or encephalitis. No focal neuro deficits. Will treat with amoxicillin (as pt requires $4 list due to monetary constraints) and provided with ciprodex here. She does appear to be in significant pain but cannot take NSAIDs due to h/o PUD; will advise tylenol, give norco x 2 for breakthrough. Advised her to f/u with PCP and encouraged PCP f/u for routine health  maintenance. Pt in agreement. Return precautions given.  Clinical Impression: 1. Acute suppurative otitis media of right ear with spontaneous rupture of tympanic membrane, recurrence not specified   2. Otitis externa, right     Disposition: Discharge  Condition: Good  I have discussed the results, Dx and Tx plan with the pt(& family if present). He/she/they expressed understanding and agree(s) with the plan. Discharge instructions discussed at great length. Strict return precautions discussed and pt &/or family have verbalized understanding of the instructions. No further questions at time of discharge.   Follow Up: [facility] [place] [address] [phone]   Call to set up an appointment with a primary care doctor  [place] [phone]  Call if you would like an appointment with one of our providers      Duffy Bruce, MD 04/24/16 1821

## 2016-04-24 NOTE — Discharge Instructions (Signed)
Otitis Media With Effusion Otitis media with effusion is the presence of fluid in the middle ear. This is a common problem in children, which often follows ear infections. It may be present for weeks or longer after the infection. Unlike an acute ear infection, otitis media with effusion refers only to fluid behind the ear drum and not infection. Children with repeated ear and sinus infections and allergy problems are the most likely to get otitis media with effusion. CAUSES  The most frequent cause of the fluid buildup is dysfunction of the eustachian tubes. These are the tubes that drain fluid in the ears to the back of the nose (nasopharynx). SYMPTOMS   The main symptom of this condition is hearing loss. As a result, you or your child may:  Listen to the TV at a loud volume.  Not respond to questions.  Ask "what" often when spoken to.  Mistake or confuse one sound or word for another.  There may be a sensation of fullness or pressure but usually not pain. DIAGNOSIS   Your health care provider will diagnose this condition by examining you or your child's ears.  Your health care provider may test the pressure in you or your child's ear with a tympanometer.  A hearing test may be conducted if the problem persists. TREATMENT   Treatment depends on the duration and the effects of the effusion.  Antibiotics, decongestants, nose drops, and cortisone-type drugs (tablets or nasal spray) may not be helpful.  Children with persistent ear effusions may have delayed language or behavioral problems. Children at risk for developmental delays in hearing, learning, and speech may require referral to a specialist earlier than children not at risk.  You or your child's health care provider may suggest a referral to an ear, nose, and throat surgeon for treatment. The following may help restore normal hearing:  Drainage of fluid.  Placement of ear tubes (tympanostomy tubes).  Removal of adenoids  (adenoidectomy). HOME CARE INSTRUCTIONS   Avoid secondhand smoke.  Infants who are breastfed are less likely to have this condition.  Avoid feeding infants while they are lying flat.  Avoid known environmental allergens.  Avoid people who are sick. SEEK MEDICAL CARE IF:   Hearing is not better in 3 months.  Hearing is worse.  Ear pain.  Drainage from the ear.  Dizziness. MAKE SURE YOU:   Understand these instructions.  Will watch your condition.  Will get help right away if you are not doing well or get worse.   This information is not intended to replace advice given to you by your health care provider. Make sure you discuss any questions you have with your health care provider.   Document Released: 11/01/2004 Document Revised: 10/15/2014 Document Reviewed: 04/21/2013 Elsevier Interactive Patient Education 2016 Elsevier Inc.  Otitis Externa Otitis externa is a bacterial or fungal infection of the outer ear canal. This is the area from the eardrum to the outside of the ear. Otitis externa is sometimes called "swimmer's ear." CAUSES  Possible causes of infection include:  Swimming in dirty water.  Moisture remaining in the ear after swimming or bathing.  Mild injury (trauma) to the ear.  Objects stuck in the ear (foreign body).  Cuts or scrapes (abrasions) on the outside of the ear. SIGNS AND SYMPTOMS  The first symptom of infection is often itching in the ear canal. Later signs and symptoms may include swelling and redness of the ear canal, ear pain, and yellowish-white fluid (pus) coming  from the ear. The ear pain may be worse when pulling on the earlobe. DIAGNOSIS  Your health care provider will perform a physical exam. A sample of fluid may be taken from the ear and examined for bacteria or fungi. TREATMENT  Antibiotic ear drops are often given for 10 to 14 days. Treatment may also include pain medicine or corticosteroids to reduce itching and  swelling. HOME CARE INSTRUCTIONS   Apply antibiotic ear drops to the ear canal as prescribed by your health care provider.  Take medicines only as directed by your health care provider.  If you have diabetes, follow any additional treatment instructions from your health care provider.  Keep all follow-up visits as directed by your health care provider. PREVENTION   Keep your ear dry. Use the corner of a towel to absorb water out of the ear canal after swimming or bathing.  Avoid scratching or putting objects inside your ear. This can damage the ear canal or remove the protective wax that lines the canal. This makes it easier for bacteria and fungi to grow.  Avoid swimming in lakes, polluted water, or poorly chlorinated pools.  You may use ear drops made of rubbing alcohol and vinegar after swimming. Combine equal parts of white vinegar and alcohol in a bottle. Put 3 or 4 drops into each ear after swimming. SEEK MEDICAL CARE IF:   You have a fever.  Your ear is still red, swollen, painful, or draining pus after 3 days.  Your redness, swelling, or pain gets worse.  You have a severe headache.  You have redness, swelling, pain, or tenderness in the area behind your ear. MAKE SURE YOU:   Understand these instructions.  Will watch your condition.  Will get help right away if you are not doing well or get worse.   This information is not intended to replace advice given to you by your health care provider. Make sure you discuss any questions you have with your health care provider.   Document Released: 09/24/2005 Document Revised: 10/15/2014 Document Reviewed: 10/11/2011 Elsevier Interactive Patient Education [employer].

## 2016-06-06 ENCOUNTER — Encounter (HOSPITAL_COMMUNITY): Payer: Self-pay

## 2016-06-06 ENCOUNTER — Emergency Department (HOSPITAL_COMMUNITY): Payer: Self-pay

## 2016-06-06 ENCOUNTER — Emergency Department (HOSPITAL_COMMUNITY)
Admission: EM | Admit: 2016-06-06 | Discharge: 2016-06-06 | Disposition: A | Discharge status: Home / Self Care | Payer: Self-pay | Attending: Emergency Medicine | Admitting: Emergency Medicine

## 2016-06-06 DIAGNOSIS — F17210 Nicotine dependence, cigarettes, uncomplicated: Secondary | ICD-10-CM | POA: Insufficient documentation

## 2016-06-06 DIAGNOSIS — M5442 Lumbago with sciatica, left side: Secondary | ICD-10-CM | POA: Insufficient documentation

## 2016-06-06 DIAGNOSIS — E039 Hypothyroidism, unspecified: Secondary | ICD-10-CM | POA: Insufficient documentation

## 2016-06-06 DIAGNOSIS — Z79899 Other long term (current) drug therapy: Secondary | ICD-10-CM | POA: Insufficient documentation

## 2016-06-06 DIAGNOSIS — R109 Unspecified abdominal pain: Secondary | ICD-10-CM | POA: Insufficient documentation

## 2016-06-06 LAB — CBC WITH DIFFERENTIAL/PLATELET
Basophils Absolute: 0 10*3/uL (ref 0.0–0.1)
Basophils Relative: 0 %
Eosinophils Absolute: 0.1 10*3/uL (ref 0.0–0.7)
Eosinophils Relative: 1 %
HCT: 35.5 % — ABNORMAL LOW (ref 36.0–46.0)
HEMOGLOBIN: 10.8 g/dL — AB (ref 12.0–15.0)
LYMPHS ABS: 1.4 10*3/uL (ref 0.7–4.0)
LYMPHS PCT: 16 %
MCH: 25.3 pg — AB (ref 26.0–34.0)
MCHC: 30.4 g/dL (ref 30.0–36.0)
MCV: 83.1 fL (ref 78.0–100.0)
Monocytes Absolute: 0.8 10*3/uL (ref 0.1–1.0)
Monocytes Relative: 10 %
NEUTROS PCT: 73 %
Neutro Abs: 6.2 10*3/uL (ref 1.7–7.7)
Platelets: 415 10*3/uL — ABNORMAL HIGH (ref 150–400)
RBC: 4.27 MIL/uL (ref 3.87–5.11)
RDW: 16.8 % — ABNORMAL HIGH (ref 11.5–15.5)
WBC: 8.5 10*3/uL (ref 4.0–10.5)

## 2016-06-06 LAB — BASIC METABOLIC PANEL
Anion gap: 11 (ref 5–15)
BUN: 10 mg/dL (ref 6–20)
CHLORIDE: 100 mmol/L — AB (ref 101–111)
CO2: 24 mmol/L (ref 22–32)
Calcium: 9.1 mg/dL (ref 8.9–10.3)
Creatinine, Ser: 0.99 mg/dL (ref 0.44–1.00)
GFR calc Af Amer: >60 mL/min (ref >60)
GFR calc non Af Amer: >60 mL/min (ref >60)
Glucose, Bld: 95 mg/dL (ref 65–99)
POTASSIUM: 3.6 mmol/L (ref 3.5–5.1)
SODIUM: 135 mmol/L (ref 135–145)

## 2016-06-06 LAB — URINALYSIS, ROUTINE W REFLEX MICROSCOPIC
Bilirubin Urine: NEGATIVE
Glucose, UA: NEGATIVE mg/dL
Hgb urine dipstick: NEGATIVE
Ketones, ur: NEGATIVE mg/dL
LEUKOCYTES UA: NEGATIVE
NITRITE: NEGATIVE
Protein, ur: NEGATIVE mg/dL
pH: 6 (ref 5.0–8.0)

## 2016-06-06 MED ORDER — HYDROCODONE-ACETAMINOPHEN 5-325 MG PO TABS: 1.0000 | ORAL_TABLET | Freq: Four times a day (QID) | ORAL | 0 refills | Status: DC | PRN

## 2016-06-06 MED ORDER — PREDNISONE 10 MG (21) PO TBPK: 10.0000 mg | ORAL_TABLET | Freq: Every day | ORAL | 0 refills | Status: DC

## 2016-06-06 MED ORDER — HYDROMORPHONE HCL 1 MG/ML IJ SOLN
1.0000 mg | Freq: Once | INTRAMUSCULAR | Status: AC
  Administered 2016-06-06: 1 mg via INTRAMUSCULAR
  Filled 2016-06-06: qty 1

## 2016-06-06 NOTE — ED Notes (Signed)
Pt reports that a week ago she feel down he back steps of her house and bounced on her butt.  Pt reports that she has a "bad back" and that this incident caused he back to hurt.  Pt also reports a history of kidney infections.  The Pt chief complaint and the reason she came to the ED today is because starting yesterday she began to have a sharp and cramping pain in her left flank rated 10 out of 10. The pain was so bad that the she was unable to sleep last night.  Pt took 800 mg of Advil this morning, at 0900, but it did nothing for the pain. Pt also took an 2-3 Asprin as well.

## 2016-06-06 NOTE — ED Triage Notes (Signed)
Pt c/o left flank pain since yesterday.  Has been taking AZO.  Pt says she has chronic UTIs.

## 2016-06-06 NOTE — ED Provider Notes (Signed)
[facility] DEPT Provider Note   CSN: [id number] Arrival date & time: 06/06/16  1110     History   Chief Complaint Chief Complaint  Patient presents with  . Flank Pain    HPI Jacqueline [name] is a 50 y.o. female.  HPI 50 year old female with past medical history of chronic alcoholism, anxiety and depression who presents with lower back and left-sided flank pain. Patient has a long history of lower back pain. She states that she fell onto her tailbone last week and went down 4-5 stairs. Denies any head injury or loss of consciousness. Since then she has had progressively worsening left paraspinal back pain radiating down her left leg along the back and leg. She has intermittent tingling but no persistent numbness or weakness. She is also began to develop mild left flank pain. Denies any associated dysuria or urinary frequency, but she does have history of UTIs. She states she may also have hit this when she fell. Denies any recent fevers, no chest pain, cough or shortness of breath. No sputum production. Her pain is all made worse with movement and palpation. Denies any alleviating factors  Past Medical History:  Diagnosis Date  . Alcohol abuse   . Alcoholism ([place])   . Anemia   . Anxiety   . Back pain   . Benzodiazepine abuse   . Depression   . Hypoglycemia   . Hypothyroidism   . Narcotic abuse   . Peptic ulcer   . Seizures ([place])   . Suicide attempt ([place])    multiple times  . Thrombocytopenia ([place]) 06/17/2011  . Thyroid disease     Patient Active Problem List   Diagnosis Date Noted  . Alcohol withdrawal seizure ([place]) 10/08/2015  . Seizure ([place]) 10/08/2015  . Dysuria 10/08/2015  . Alcohol use disorder, severe, dependence ([place]) 10/04/2015  . Bipolar disorder, current episode depressed, severe, without psychotic features ([place]) 02/17/2015  . Alcoholism ([place])   . Alcohol dependence with withdrawal, uncomplicated ([place]) [id number]  . Intentional ibuprofen overdose ([place])  10/17/2014  . Severe recurrent major depression without psychotic features ([place]) 10/17/2014  . Substance induced mood disorder ([place]) 10/17/2014  . Overdose   . Suicidal ideation   . Persistent alcohol intoxication delirium with moderate or severe use disorder ([place]) 12/19/2013  . PTSD (post-traumatic stress disorder) 08/03/2013  . Unspecified episodic mood disorder 05/14/2013  . Hallucinations 04/14/2013  . Anastomotic ulcer, acute 03/23/2013  . Melena 03/21/2013  . Abnormal liver enzymes 12/18/2011  . Cocaine abuse, episodic 12/18/2011    Class: Acute  . PUD (peptic ulcer disease) 12/18/2011  . Anxiety disorder 06/19/2011  . Bacterial vaginosis 06/19/2011  . Anemia 06/17/2011  . Thrombocytopenia ([place]) 06/17/2011  . UTI (urinary tract infection) 06/16/2011  . Hypothyroidism 06/12/2011  . Polysubstance abuse 06/12/2011    Past Surgical History:  Procedure Laterality Date  . ABDOMINAL SURGERY    . CHOLECYSTECTOMY    . ESOPHAGOGASTRODUODENOSCOPY    . ESOPHAGOGASTRODUODENOSCOPY N/A 03/23/2013   Procedure: ESOPHAGOGASTRODUODENOSCOPY (EGD);  Surgeon: Ladene Artist, MD;  Location: [facility];  Service: Endoscopy;  Laterality: N/A;  . GASTRIC BYPASS    . TUBAL LIGATION      OB History    Gravida Para Term Preterm AB Living   3 3 0 3 0     SAB TAB Ectopic Multiple Live Births   0 0 0           Home Medications    Prior to Admission medications  Medication Sig Start Date End Date Taking? Authorizing Provider  amitriptyline (ELAVIL) 50 MG tablet Take 100 mg by mouth at bedtime as needed for sleep.    Yes Historical Provider, MD  buPROPion (WELLBUTRIN XL) 300 MG 24 hr tablet Take 300 mg by mouth daily.   Yes Historical Provider, MD  clonazePAM (KLONOPIN) 1 MG tablet Take 1 mg by mouth 3 (three) times daily.   Yes Historical Provider, MD  gabapentin (NEURONTIN) 400 MG capsule Take 2 capsules (800 mg total) by mouth 3 (three) times daily. For agitation Patient taking  differently: Take 800 mg by mouth 3 (three) times daily as needed (pain). For agitation 10/07/15  Yes Encarnacion Slates, NP  hydrochlorothiazide (HYDRODIURIL) 25 MG tablet Take 25 mg by mouth daily.   Yes Historical Provider, MD  levothyroxine (SYNTHROID, LEVOTHROID) 175 MCG tablet Take 1 tablet (175 mcg total) by mouth daily before breakfast. For low thyroid function 10/12/15  Yes Verlee Monte, MD  methocarbamol (ROBAXIN) 500 MG tablet Take 1,000 mg by mouth every 8 (eight) hours as needed for muscle spasms.   Yes Historical Provider, MD  norethindrone (MICRONOR,CAMILA,ERRIN) 0.35 MG tablet Take 1 tablet by mouth daily.   Yes Historical Provider, MD  omeprazole (PRILOSEC) 20 MG capsule Take 40 mg by mouth daily.   Yes Historical Provider, MD  potassium chloride SA (K-DUR,KLOR-CON) 20 MEQ tablet Take 2 tablets (40 mEq total) by mouth daily. 04/24/16  Yes Duffy Bruce, MD  sertraline (ZOLOFT) 100 MG tablet Take 1 tablet (100 mg total) by mouth daily. For depression Patient taking differently: Take 150 mg by mouth daily. For depression 10/07/15  Yes Encarnacion Slates, NP  topiramate (TOPAMAX) 25 MG tablet Take 1 tablet (25 mg total) by mouth 2 (two) times daily. 10/12/15  Yes Verlee Monte, MD  amoxicillin (AMOXIL) 500 MG capsule Take 1 capsule (500 mg total) by mouth 3 (three) times daily. Patient not taking: Reported on 06/06/2016 04/24/16   Duffy Bruce, MD  HYDROcodone-acetaminophen (NORCO/VICODIN) 5-325 MG tablet Take 1 tablet by mouth every 6 (six) hours as needed for severe pain. 06/06/16   Duffy Bruce, MD  predniSONE (STERAPRED UNI-PAK 21 TAB) 10 MG (21) TBPK tablet Take 1 tablet (10 mg total) by mouth daily. Take 6 tabs by mouth daily  for 2 days, then 5 tabs for 2 days, then 4 tabs for 2 days, then 3 tabs for 2 days, 2 tabs for 2 days, then 1 tab by mouth daily for 2 days 06/06/16   Duffy Bruce, MD  ranitidine (ZANTAC) 150 MG tablet Take 1 tablet (150 mg total) by mouth 2 (two) times daily. Patient  not taking: Reported on 04/24/2016 03/30/16   Orpah Greek, MD  sucralfate (CARAFATE) 1 GM/10ML suspension Take 10 mLs (1 g total) by mouth 4 (four) times daily -  with meals and at bedtime. Patient not taking: Reported on 04/24/2016 03/30/16   Orpah Greek, MD    Family History Family History  Problem Relation Age of Onset  . Alcohol abuse Father   . Alcoholism Father   . Cancer Other     Social History Social History  Substance Use Topics  . Smoking status: Current Every Day Smoker    Packs/day: 0.50    Types: Cigarettes  . Smokeless tobacco: Never Used  . Alcohol use No     Comment: former     Allergies   Nsaids   Review of Systems Review of Systems  Constitutional: Negative for chills,  fatigue and fever.  HENT: Negative for congestion and rhinorrhea.   Eyes: Negative for visual disturbance.  Respiratory: Negative for cough, shortness of breath and wheezing.   Cardiovascular: Negative for chest pain and leg swelling.  Gastrointestinal: Negative for abdominal pain, diarrhea, nausea and vomiting.  Genitourinary: Positive for flank pain. Negative for dysuria.  Musculoskeletal: Positive for back pain and gait problem (Due to pain). Negative for neck pain and neck stiffness.  Skin: Negative for rash and wound.  Allergic/Immunologic: Negative for immunocompromised state.  Neurological: Negative for syncope, weakness and headaches.  All other systems reviewed and are negative.    Physical Exam Updated Vital Signs BP 101/60   Pulse 61   Temp 98.6 F (37 C) (Oral)   Resp 17   Ht 5\' 9"  (1.753 m)   Wt 212 lb (96.2 kg)   LMP 05/30/2016   SpO2 95%   BMI 31.31 kg/m   Physical Exam  Constitutional: She is oriented to person, place, and time. She appears well-developed and well-nourished. No distress.  HENT:  Head: Normocephalic and atraumatic.  Eyes: Conjunctivae are normal.  Neck: Neck supple.  Cardiovascular: Normal rate, regular rhythm and normal  heart sounds.  Exam reveals no friction rub.   No murmur heard. Pulmonary/Chest: Effort normal and breath sounds normal. No respiratory distress. She has no wheezes. She has no rales.  Abdominal: Soft. Bowel sounds are normal. She exhibits no distension. There is no tenderness. There is no rebound and no guarding.  No CVA tenderness bilaterally  Musculoskeletal: She exhibits no edema.  Neurological: She is alert and oriented to person, place, and time. She exhibits normal muscle tone.  Skin: Skin is warm. Capillary refill takes less than 2 seconds.  Psychiatric: She has a normal mood and affect.  Nursing note and vitals reviewed.  Spine Exam: Inspection/Palpation: Mild tenderness across left lateral lower lumbar spine. No deformity. No bruising Strength: 5/5 throughout LE bilaterally (hip flexion/extension, adduction/abduction; knee flexion/extension; foot dorsiflexion/plantarflexion, inversion/eversion; great toe inversion) Sensation: Intact to light touch in proximal and distal LE bilaterally Reflexes: 2+ quadriceps and achilles reflexes  ED Treatments / Results  Labs (all labs ordered are listed, but only abnormal results are displayed) Labs Reviewed  URINALYSIS, ROUTINE W REFLEX MICROSCOPIC (NOT AT [facility]) - Abnormal; Notable for the following:       Result Value   Specific Gravity, Urine <1.005 (*)    All other components within normal limits  CBC WITH DIFFERENTIAL/PLATELET - Abnormal; Notable for the following:    Hemoglobin 10.8 (*)    HCT 35.5 (*)    MCH 25.3 (*)    RDW 16.8 (*)    Platelets 415 (*)    All other components within normal limits  BASIC METABOLIC PANEL - Abnormal; Notable for the following:    Chloride 100 (*)    All other components within normal limits    EKG  EKG Interpretation None       Radiology Dg Chest 2 View  Result Date: 06/06/2016 CLINICAL DATA:  Persistent pain following fall 1 week prior EXAM: CHEST  2 VIEW COMPARISON:  March 30, 2016  FINDINGS: Lungs are clear. Heart size and pulmonary vascularity are normal. No adenopathy. No pneumothorax. No bone lesions. IMPRESSION: No edema or consolidation. Electronically Signed   By: Lowella Grip III M.D.   On: 06/06/2016 15:35   Dg Lumbar Spine Complete  Result Date: 06/06/2016 CLINICAL DATA:  Status post fall 1 week ago with continued low back pain. Initial  encounter. EXAM: LUMBAR SPINE - COMPLETE 4+ VIEW COMPARISON:  CT chest, abdomen and pelvis 03/30/2016. FINDINGS: No fracture or malalignment is identified. Transitional anatomy is seen at the lumbosacral junction with lumbarization of the first sacral segment on the left. Loss of disc space height is noted at L5-S1. Paraspinous structures demonstrate suture material in the left upper quadrant cholecystectomy clips in the right upper quadrant. IMPRESSION: No acute abnormality. Lower lumbar spondylosis. Transitional lumbosacral anatomy. Electronically Signed   By: Inge Rise M.D.   On: 06/06/2016 15:38    Procedures Procedures (including critical care time)  Medications Ordered in ED Medications  HYDROmorphone (DILAUDID) injection 1 mg (1 mg Intramuscular Given 06/06/16 1548)     Initial Impression / Assessment and Plan / ED Course  I have reviewed the triage vital signs and the nursing notes.  Pertinent labs & imaging results that were available during my care of the patient were reviewed by me and considered in my medical decision making (see chart for details).  Clinical Course    50 year old female with past medical history as above who presents with lower back and left flank pain after mechanical fall. Vital signs are stable and within normal limits. Screening lab work is overall reassuring with normal white blood cell count. Urinalysis showed no signs of UTI. Plain films obtained show no acute fracture or malalignment. Chest x-ray is without abnormality. Regarding her flank pain and back pain, suspect both of these  are secondary to her mechanical fall. She has a history of chronic back pain I suspect she has an acute on chronic lumbar radiculopathy. No red flag symptoms. She has no loss of bowel or bladder function. Perianal anesthesia or signs of cauda equina. No masses or lesions on x-ray. No fevers, chills, or history of IV drug abuse to suspect osteomyelitis or epidural abscess. She is ambulatory without difficulty. Urinalysis shows no evidence of UTI or suspicion for stone or pyelonephritis. Chest x-ray shows no basilar pneumonia or other etiology for her flank pain. Will treat her for musculoskeletal pain, and outpatient follow-up. Return precautions given. [employer] drug database reviewed. Patient has not received any recent narcotics.  Final Clinical Impressions(s) / ED Diagnoses   Final diagnoses:  Bilateral low back pain with left-sided sciatica  Flank pain    New Prescriptions Discharge Medication List as of 06/06/2016  4:45 PM    START taking these medications   Details  predniSONE (STERAPRED UNI-PAK 21 TAB) 10 MG (21) TBPK tablet Take 1 tablet (10 mg total) by mouth daily. Take 6 tabs by mouth daily  for 2 days, then 5 tabs for 2 days, then 4 tabs for 2 days, then 3 tabs for 2 days, 2 tabs for 2 days, then 1 tab by mouth daily for 2 days, Starting Wed 06/06/2016, Print         Duffy Bruce, MD 06/06/16 2127

## 2016-06-23 ENCOUNTER — Emergency Department (HOSPITAL_COMMUNITY): Payer: Self-pay

## 2016-06-23 ENCOUNTER — Encounter (HOSPITAL_COMMUNITY): Payer: Self-pay | Admitting: Emergency Medicine

## 2016-06-23 ENCOUNTER — Emergency Department (HOSPITAL_COMMUNITY)
Admission: EM | Admit: 2016-06-23 | Discharge: 2016-06-23 | Disposition: A | Discharge status: Home / Self Care | Payer: Self-pay | Attending: Emergency Medicine | Admitting: Emergency Medicine

## 2016-06-23 DIAGNOSIS — R109 Unspecified abdominal pain: Secondary | ICD-10-CM | POA: Insufficient documentation

## 2016-06-23 DIAGNOSIS — F17210 Nicotine dependence, cigarettes, uncomplicated: Secondary | ICD-10-CM | POA: Insufficient documentation

## 2016-06-23 DIAGNOSIS — E039 Hypothyroidism, unspecified: Secondary | ICD-10-CM | POA: Insufficient documentation

## 2016-06-23 DIAGNOSIS — Z79899 Other long term (current) drug therapy: Secondary | ICD-10-CM | POA: Insufficient documentation

## 2016-06-23 LAB — COMPREHENSIVE METABOLIC PANEL
ALBUMIN: 4.4 g/dL (ref 3.5–5.0)
ALT: 98 U/L — ABNORMAL HIGH (ref 14–54)
AST: 105 U/L — AB (ref 15–41)
Alkaline Phosphatase: 103 U/L (ref 38–126)
Anion gap: 15 (ref 5–15)
BILIRUBIN TOTAL: 0.8 mg/dL (ref 0.3–1.2)
BUN: 16 mg/dL (ref 6–20)
CHLORIDE: 98 mmol/L — AB (ref 101–111)
CO2: 22 mmol/L (ref 22–32)
Calcium: 9.5 mg/dL (ref 8.9–10.3)
Creatinine, Ser: 0.97 mg/dL (ref 0.44–1.00)
GFR calc Af Amer: >60 mL/min (ref >60)
GFR calc non Af Amer: >60 mL/min (ref >60)
GLUCOSE: 94 mg/dL (ref 65–99)
POTASSIUM: 3.9 mmol/L (ref 3.5–5.1)
Sodium: 135 mmol/L (ref 135–145)
Total Protein: 7.7 g/dL (ref 6.5–8.1)

## 2016-06-23 LAB — URINALYSIS, ROUTINE W REFLEX MICROSCOPIC
GLUCOSE, UA: NEGATIVE mg/dL
Hgb urine dipstick: NEGATIVE
Leukocytes, UA: NEGATIVE
Nitrite: NEGATIVE
PH: 6.5 (ref 5.0–8.0)
Protein, ur: NEGATIVE mg/dL
Specific Gravity, Urine: 1.015 (ref 1.005–1.030)

## 2016-06-23 LAB — CBC
HEMATOCRIT: 35.4 % — AB (ref 36.0–46.0)
Hemoglobin: 11.4 g/dL — ABNORMAL LOW (ref 12.0–15.0)
MCH: 27.3 pg (ref 26.0–34.0)
MCHC: 32.2 g/dL (ref 30.0–36.0)
MCV: 84.9 fL (ref 78.0–100.0)
Platelets: 239 10*3/uL (ref 150–400)
RBC: 4.17 MIL/uL (ref 3.87–5.11)
RDW: 20.6 % — AB (ref 11.5–15.5)
WBC: 7.3 10*3/uL (ref 4.0–10.5)

## 2016-06-23 LAB — PREGNANCY, URINE: Preg Test, Ur: NEGATIVE

## 2016-06-23 LAB — LIPASE, BLOOD: LIPASE: 25 U/L (ref 11–51)

## 2016-06-23 MED ORDER — KETOROLAC TROMETHAMINE 30 MG/ML IJ SOLN
INTRAMUSCULAR | Status: AC
  Filled 2016-06-23: qty 1

## 2016-06-23 MED ORDER — KETOROLAC TROMETHAMINE 30 MG/ML IJ SOLN: 60.0000 mg | Freq: Once | INTRAMUSCULAR | Status: DC

## 2016-06-23 MED ORDER — FENTANYL CITRATE (PF) 100 MCG/2ML IJ SOLN
50.0000 ug | INTRAMUSCULAR | Status: DC | PRN
  Administered 2016-06-23: 50 ug via INTRAVENOUS
  Filled 2016-06-23: qty 2

## 2016-06-23 MED ORDER — KETOROLAC TROMETHAMINE 30 MG/ML IJ SOLN
30.0000 mg | Freq: Once | INTRAMUSCULAR | Status: AC
  Administered 2016-06-23: 30 mg via INTRAVENOUS

## 2016-06-23 MED ORDER — ONDANSETRON HCL 4 MG/2ML IJ SOLN: 4.0000 mg | Freq: Once | INTRAMUSCULAR | Status: DC

## 2016-06-23 MED ORDER — ONDANSETRON HCL 4 MG/2ML IJ SOLN
4.0000 mg | Freq: Once | INTRAMUSCULAR | Status: AC
  Administered 2016-06-23: 4 mg via INTRAVENOUS
  Filled 2016-06-23: qty 2

## 2016-06-23 MED ORDER — CYCLOBENZAPRINE HCL 10 MG PO TABS: 10.0000 mg | ORAL_TABLET | Freq: Three times a day (TID) | ORAL | 0 refills | Status: DC | PRN

## 2016-06-23 NOTE — Discharge Instructions (Signed)
Flexeril as prescribed as needed for pain.  Follow-up with your primary Dr. if you're not improving in the next week.

## 2016-06-23 NOTE — ED Triage Notes (Signed)
PT c/o left flank pain with no n/v/d x2 weeks. PT states normal BM this am.

## 2016-06-23 NOTE — ED Provider Notes (Signed)
[facility] DEPT Provider Note   CSN: [id number] Arrival date & time: 06/23/16  1101  By signing my name below, I, Reola Mosher, attest that this documentation has been prepared under the direction and in the presence of Veryl Speak, MD. Electronically Signed: Reola Mosher, ED Scribe. 06/23/16. 12:11 PM.  History   Chief Complaint Chief Complaint  Patient presents with  . Flank Pain   The history is provided by the patient. No language interpreter was used.  Flank Pain  This is a recurrent problem. The problem occurs constantly. The problem has been gradually worsening. Associated symptoms include abdominal pain. Pertinent negatives include no chest pain, no headaches and no shortness of breath. Exacerbated by: movement. Nothing relieves the symptoms. Treatments tried: Ibuprofen and Prednisone. The treatment provided no relief.   HPI Comments: Jacqueline [name] is a 50 y.o. female with a PMHx of polysubstance abuse, who presents to the Emergency Department complaining of severe, left-sided flank pain that radiates around into the left side of her abdomen onset PTA. She describes her pain as sometimes dull and sometimes sharp, and reports her pain as being constant. Pt was seen in the ED ~2 weeks ago for same, and at that time she had an DG L-spine and basic lab work performed at that time that was unremarkable. She was rx'd Prednisone at that time which she has been taking, and additionally taking Ibuprofen with minimal relief of her pain. Pt reports a hx of similar symptoms due to "chronic pyelonephritis", but notes that her pain today is more severe. Her pain is exacerbated with movement. No hx of renal calculi. Denies dysuria, hematuria, frequency, urgency, difficulty urinating, constipation, diarrhea, bloody stools, nausea, vomiting, or any other associated symptoms.   Past Medical History:  Diagnosis Date  . Alcohol abuse   . Alcoholism ([place])   . Anemia   . Anxiety    . Back pain   . Benzodiazepine abuse   . Depression   . Hypoglycemia   . Hypothyroidism   . Narcotic abuse   . Peptic ulcer   . Seizures ([place])   . Suicide attempt ([place])    multiple times  . Thrombocytopenia ([place]) 06/17/2011  . Thyroid disease    Patient Active Problem List   Diagnosis Date Noted  . Alcohol withdrawal seizure ([place]) 10/08/2015  . Seizure ([place]) 10/08/2015  . Dysuria 10/08/2015  . Alcohol use disorder, severe, dependence ([place]) 10/04/2015  . Bipolar disorder, current episode depressed, severe, without psychotic features ([place]) 02/17/2015  . Alcoholism ([place])   . Alcohol dependence with withdrawal, uncomplicated ([place]) [id number]  . Intentional ibuprofen overdose ([place]) 10/17/2014  . Severe recurrent major depression without psychotic features ([place]) 10/17/2014  . Substance induced mood disorder ([place]) 10/17/2014  . Overdose   . Suicidal ideation   . Persistent alcohol intoxication delirium with moderate or severe use disorder ([place]) 12/19/2013  . PTSD (post-traumatic stress disorder) 08/03/2013  . Unspecified episodic mood disorder 05/14/2013  . Hallucinations 04/14/2013  . Anastomotic ulcer, acute 03/23/2013  . Melena 03/21/2013  . Abnormal liver enzymes 12/18/2011  . Cocaine abuse, episodic 12/18/2011    Class: Acute  . PUD (peptic ulcer disease) 12/18/2011  . Anxiety disorder 06/19/2011  . Bacterial vaginosis 06/19/2011  . Anemia 06/17/2011  . Thrombocytopenia ([place]) 06/17/2011  . UTI (urinary tract infection) 06/16/2011  . Hypothyroidism 06/12/2011  . Polysubstance abuse 06/12/2011   Past Surgical History:  Procedure Laterality Date  . ABDOMINAL SURGERY    . CHOLECYSTECTOMY    .  ESOPHAGOGASTRODUODENOSCOPY    . ESOPHAGOGASTRODUODENOSCOPY N/A 03/23/2013   Procedure: ESOPHAGOGASTRODUODENOSCOPY (EGD);  Surgeon: Ladene Artist, MD;  Location: [facility];  Service: Endoscopy;  Laterality: N/A;  . GASTRIC BYPASS    . TUBAL LIGATION     OB History    Gravida  Para Term Preterm AB Living   3 3 0 3 0 3   SAB TAB Ectopic Multiple Live Births   0 0 0         Home Medications    Prior to Admission medications   Medication Sig Start Date End Date Taking? Authorizing Provider  amitriptyline (ELAVIL) 50 MG tablet Take 100 mg by mouth at bedtime as needed for sleep.    Yes Historical Provider, MD  buPROPion (WELLBUTRIN XL) 300 MG 24 hr tablet Take 300 mg by mouth daily.   Yes Historical Provider, MD  clonazePAM (KLONOPIN) 1 MG tablet Take 1 mg by mouth 3 (three) times daily.   Yes Historical Provider, MD  gabapentin (NEURONTIN) 400 MG capsule Take 2 capsules (800 mg total) by mouth 3 (three) times daily. For agitation Patient taking differently: Take 800 mg by mouth 3 (three) times daily as needed (pain). For agitation 10/07/15  Yes Encarnacion Slates, NP  hydrochlorothiazide (HYDRODIURIL) 25 MG tablet Take 25 mg by mouth daily.   Yes Historical Provider, MD  levothyroxine (SYNTHROID, LEVOTHROID) 175 MCG tablet Take 1 tablet (175 mcg total) by mouth daily before breakfast. For low thyroid function 10/12/15  Yes Verlee Monte, MD  norethindrone (MICRONOR,CAMILA,ERRIN) 0.35 MG tablet Take 1 tablet by mouth daily.   Yes Historical Provider, MD  omeprazole (PRILOSEC) 20 MG capsule Take 40 mg by mouth daily.   Yes Historical Provider, MD  potassium chloride SA (K-DUR,KLOR-CON) 20 MEQ tablet Take 2 tablets (40 mEq total) by mouth daily. 04/24/16  Yes Duffy Bruce, MD  sertraline (ZOLOFT) 100 MG tablet Take 1 tablet (100 mg total) by mouth daily. For depression Patient taking differently: Take 150 mg by mouth daily. For depression 10/07/15  Yes Encarnacion Slates, NP  topiramate (TOPAMAX) 25 MG tablet Take 1 tablet (25 mg total) by mouth 2 (two) times daily. 10/12/15  Yes Verlee Monte, MD  amoxicillin (AMOXIL) 500 MG capsule Take 1 capsule (500 mg total) by mouth 3 (three) times daily. Patient not taking: Reported on 06/06/2016 04/24/16   Duffy Bruce, MD    HYDROcodone-acetaminophen (NORCO/VICODIN) 5-325 MG tablet Take 1 tablet by mouth every 6 (six) hours as needed for severe pain. Patient not taking: Reported on 06/23/2016 06/06/16   Duffy Bruce, MD  methocarbamol (ROBAXIN) 500 MG tablet Take 1,000 mg by mouth every 8 (eight) hours as needed for muscle spasms.    Historical Provider, MD  predniSONE (STERAPRED UNI-PAK 21 TAB) 10 MG (21) TBPK tablet Take 1 tablet (10 mg total) by mouth daily. Take 6 tabs by mouth daily  for 2 days, then 5 tabs for 2 days, then 4 tabs for 2 days, then 3 tabs for 2 days, 2 tabs for 2 days, then 1 tab by mouth daily for 2 days Patient not taking: Reported on 06/23/2016 06/06/16   Duffy Bruce, MD  ranitidine (ZANTAC) 150 MG tablet Take 1 tablet (150 mg total) by mouth 2 (two) times daily. Patient not taking: Reported on 04/24/2016 03/30/16   Orpah Greek, MD  sucralfate (CARAFATE) 1 GM/10ML suspension Take 10 mLs (1 g total) by mouth 4 (four) times daily -  with meals and at bedtime. Patient not  taking: Reported on 04/24/2016 03/30/16   Orpah Greek, MD   Family History Family History  Problem Relation Age of Onset  . Alcohol abuse Father   . Alcoholism Father   . Cancer Other    Social History Social History  Substance Use Topics  . Smoking status: Current Every Day Smoker    Packs/day: 0.50    Types: Cigarettes  . Smokeless tobacco: Never Used  . Alcohol use No     Comment: former   Allergies   Nsaids  Review of Systems Review of Systems  Respiratory: Negative for shortness of breath.   Cardiovascular: Negative for chest pain.  Gastrointestinal: Positive for abdominal pain. Negative for blood in stool, constipation, diarrhea, nausea and vomiting.  Genitourinary: Positive for flank pain. Negative for difficulty urinating, dysuria, frequency, hematuria and urgency.  Neurological: Negative for headaches.  All other systems reviewed and are negative.  Physical Exam Updated Vital  Signs BP 126/62 (BP Location: Left Arm)   Pulse 101   Temp 97.7 F (36.5 C) (Oral)   Resp 18   Ht 5\' 9"  (1.753 m)   Wt 200 lb (90.7 kg)   LMP 06/16/2016   SpO2 99%   BMI 29.53 kg/m   Physical Exam  Constitutional: She is oriented to person, place, and time. She appears well-developed and well-nourished. No distress.  HENT:  Head: Normocephalic and atraumatic.  Eyes: EOM are normal.  Neck: Normal range of motion.  Cardiovascular: Normal rate, regular rhythm and normal heart sounds.   Pulmonary/Chest: Effort normal and breath sounds normal.  Abdominal: Soft. She exhibits no distension. There is tenderness. There is CVA tenderness. There is no rebound and no guarding.  TTP to the left lateral abdomen.  Musculoskeletal: Normal range of motion. She exhibits tenderness.  TTP in the left flank and left lateral abdomen.   Neurological: She is alert and oriented to person, place, and time.  Skin: Skin is warm and dry.  Psychiatric: She has a normal mood and affect. Judgment normal.  Nursing note and vitals reviewed.  ED Treatments / Results  DIAGNOSTIC STUDIES: Oxygen Saturation is 99% on RA, normal by my interpretation.   COORDINATION OF CARE: 12:10 PM-Discussed next steps with pt. Pt verbalized understanding and is agreeable with the plan.   Labs (all labs ordered are listed, but only abnormal results are displayed) Labs Reviewed  CBC - Abnormal; Notable for the following:       Result Value   Hemoglobin 11.4 (*)    HCT 35.4 (*)    RDW 20.6 (*)    All other components within normal limits  URINALYSIS, ROUTINE W REFLEX MICROSCOPIC (NOT AT [facility]) - Abnormal; Notable for the following:    APPearance HAZY (*)    Bilirubin Urine SMALL (*)    Ketones, ur TRACE (*)    All other components within normal limits  PREGNANCY, URINE  LIPASE, BLOOD  COMPREHENSIVE METABOLIC PANEL   EKG  EKG Interpretation None      Radiology No results found.  Procedures Procedures  (including critical care time)  Medications Ordered in ED Medications  fentaNYL (SUBLIMAZE) injection 50 mcg (50 mcg Intravenous Given 06/23/16 1202)  ondansetron (ZOFRAN) injection 4 mg (4 mg Intravenous Given 06/23/16 1202)   Initial Impression / Assessment and Plan / ED Course  I have reviewed the triage vital signs and the nursing notes.  Pertinent labs & imaging results that were available during my care of the patient were reviewed by me and  considered in my medical decision making (see chart for details).  Clinical Course    Patient presents here with complaints of left flank pain that began in the absence of any injury or trauma. She denies any urinary complaints. Her pain radiates to her lower abdomen. She is tender in the lower abdomen and left flank. Her urinalysis reveals no evidence for infection. She underwent a CT scan which shows no evidence for renal calculus or other intra-abdominal pathology. I suspect a musculoskeletal etiology. She was given Toradol in the ER and will be discharged with naproxen and Flexeril. To follow-up with primary Dr. if not improving in the next week.  Final Clinical Impressions(s) / ED Diagnoses   Final diagnoses:  None    New Prescriptions New Prescriptions   No medications on file   I personally performed the services described in this documentation, which was scribed in my presence. The recorded information has been reviewed and is accurate.       Veryl Speak, MD 06/23/16 1425

## 2016-07-23 ENCOUNTER — Encounter (HOSPITAL_COMMUNITY): Payer: Self-pay | Admitting: Emergency Medicine

## 2016-07-23 ENCOUNTER — Emergency Department (HOSPITAL_COMMUNITY): Payer: No Typology Code available for payment source

## 2016-07-23 ENCOUNTER — Emergency Department (HOSPITAL_COMMUNITY)
Admission: EM | Admit: 2016-07-23 | Discharge: 2016-07-23 | Disposition: A | Discharge status: Home / Self Care | Payer: No Typology Code available for payment source | Attending: Dermatology | Admitting: Dermatology

## 2016-07-23 DIAGNOSIS — M79643 Pain in unspecified hand: Secondary | ICD-10-CM | POA: Insufficient documentation

## 2016-07-23 DIAGNOSIS — Z79899 Other long term (current) drug therapy: Secondary | ICD-10-CM | POA: Insufficient documentation

## 2016-07-23 DIAGNOSIS — W050XXA Fall from non-moving wheelchair, initial encounter: Secondary | ICD-10-CM | POA: Insufficient documentation

## 2016-07-23 DIAGNOSIS — Y939 Activity, unspecified: Secondary | ICD-10-CM | POA: Insufficient documentation

## 2016-07-23 DIAGNOSIS — E039 Hypothyroidism, unspecified: Secondary | ICD-10-CM | POA: Insufficient documentation

## 2016-07-23 DIAGNOSIS — F17210 Nicotine dependence, cigarettes, uncomplicated: Secondary | ICD-10-CM | POA: Insufficient documentation

## 2016-07-23 DIAGNOSIS — Z792 Long term (current) use of antibiotics: Secondary | ICD-10-CM | POA: Insufficient documentation

## 2016-07-23 DIAGNOSIS — Y929 Unspecified place or not applicable: Secondary | ICD-10-CM | POA: Insufficient documentation

## 2016-07-23 DIAGNOSIS — Z5321 Procedure and treatment not carried out due to patient leaving prior to being seen by health care provider: Secondary | ICD-10-CM | POA: Insufficient documentation

## 2016-07-23 DIAGNOSIS — Y999 Unspecified external cause status: Secondary | ICD-10-CM | POA: Insufficient documentation

## 2016-07-23 NOTE — ED Triage Notes (Signed)
Pt intoxicated, almost fell getting into wheelchair. Pt stating she drank alcohol for pain.

## 2016-07-23 NOTE — ED Notes (Signed)
Pt left facility walking.

## 2016-07-29 ENCOUNTER — Emergency Department (HOSPITAL_COMMUNITY)
Admission: EM | Admit: 2016-07-29 | Discharge: 2016-07-29 | Disposition: A | Discharge status: Home / Self Care | Payer: Self-pay | Attending: Emergency Medicine | Admitting: Emergency Medicine

## 2016-07-29 ENCOUNTER — Encounter (HOSPITAL_COMMUNITY): Payer: Self-pay | Admitting: Emergency Medicine

## 2016-07-29 ENCOUNTER — Emergency Department (HOSPITAL_COMMUNITY): Payer: Self-pay

## 2016-07-29 ENCOUNTER — Ambulatory Visit (HOSPITAL_COMMUNITY): Payer: Self-pay

## 2016-07-29 DIAGNOSIS — F17210 Nicotine dependence, cigarettes, uncomplicated: Secondary | ICD-10-CM | POA: Insufficient documentation

## 2016-07-29 DIAGNOSIS — Z79899 Other long term (current) drug therapy: Secondary | ICD-10-CM | POA: Insufficient documentation

## 2016-07-29 DIAGNOSIS — Y30XXXA Falling, jumping or pushed from a high place, undetermined intent, initial encounter: Secondary | ICD-10-CM | POA: Insufficient documentation

## 2016-07-29 DIAGNOSIS — E039 Hypothyroidism, unspecified: Secondary | ICD-10-CM | POA: Insufficient documentation

## 2016-07-29 DIAGNOSIS — Y939 Activity, unspecified: Secondary | ICD-10-CM | POA: Insufficient documentation

## 2016-07-29 DIAGNOSIS — Y999 Unspecified external cause status: Secondary | ICD-10-CM | POA: Insufficient documentation

## 2016-07-29 DIAGNOSIS — Y929 Unspecified place or not applicable: Secondary | ICD-10-CM | POA: Insufficient documentation

## 2016-07-29 DIAGNOSIS — S52571A Other intraarticular fracture of lower end of right radius, initial encounter for closed fracture: Secondary | ICD-10-CM | POA: Insufficient documentation

## 2016-07-29 MED ORDER — ETOMIDATE 2 MG/ML IV SOLN
10.0000 mg | Freq: Once | INTRAVENOUS | Status: AC
  Administered 2016-07-29: 10 mg via INTRAVENOUS
  Filled 2016-07-29: qty 10

## 2016-07-29 MED ORDER — ETOMIDATE 2 MG/ML IV SOLN
INTRAVENOUS | Status: AC | PRN
  Administered 2016-07-29: 10 mg via INTRAVENOUS

## 2016-07-29 MED ORDER — LIDOCAINE HCL (PF) 1 % IJ SOLN
5.0000 mL | Freq: Once | INTRAMUSCULAR | Status: AC
  Administered 2016-07-29: 5 mL via INTRADERMAL
  Filled 2016-07-29: qty 5

## 2016-07-29 MED ORDER — FENTANYL CITRATE (PF) 100 MCG/2ML IJ SOLN
INTRAMUSCULAR | Status: AC
  Filled 2016-07-29: qty 2

## 2016-07-29 MED ORDER — PROPOFOL 10 MG/ML IV BOLUS
INTRAVENOUS | Status: AC
  Filled 2016-07-29: qty 20

## 2016-07-29 MED ORDER — OXYCODONE-ACETAMINOPHEN 5-325 MG PO TABS
1.0000 | ORAL_TABLET | Freq: Once | ORAL | Status: AC
  Administered 2016-07-29: 1 via ORAL
  Filled 2016-07-29: qty 1

## 2016-07-29 MED ORDER — FENTANYL CITRATE (PF) 100 MCG/2ML IJ SOLN
25.0000 ug | Freq: Once | INTRAMUSCULAR | Status: AC
  Administered 2016-07-29: 25 ug via INTRAVENOUS

## 2016-07-29 MED ORDER — KETAMINE HCL 50 MG/ML IJ SOLN
INTRAMUSCULAR | Status: AC
  Administered 2016-07-29: 16:00:00
  Filled 2016-07-29: qty 10

## 2016-07-29 MED ORDER — PROPOFOL 10 MG/ML IV BOLUS
INTRAVENOUS | Status: AC | PRN
  Administered 2016-07-29 (×5): 40 mg via INTRAVENOUS

## 2016-07-29 MED ORDER — KETAMINE HCL 10 MG/ML IJ SOLN
INTRAMUSCULAR | Status: AC | PRN
  Administered 2016-07-29: 50 mg via INTRAVENOUS

## 2016-07-29 NOTE — ED Notes (Signed)
Pt back to baseline. Alert and verbally responsive
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
## 2016-07-29 NOTE — ED Notes (Signed)
Finger traps in place by Dr Vanita Panda

## 2016-07-29 NOTE — ED Notes (Signed)
Pt requesting pain medication. Verbal order for percocet 5/325 mg po now

## 2016-07-29 NOTE — ED Notes (Signed)
Pt refusing to stay for xray. Stating take the IV out I have to leave or I will walk out.  Dr Vanita Panda notified . Gave pt information for follow up with Dr Aline Brochure.

## 2016-07-29 NOTE — ED Notes (Signed)
Pt reports injury occurred 4 days ago
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
## 2016-07-29 NOTE — ED Notes (Signed)
Sugar tong splint placed on right arm by dr Vanita Panda

## 2016-08-02 ENCOUNTER — Emergency Department (HOSPITAL_COMMUNITY)
Admission: EM | Admit: 2016-08-02 | Discharge: 2016-08-02 | Discharge status: Against Medical Advice | Payer: Self-pay | Attending: Emergency Medicine | Admitting: Emergency Medicine

## 2016-08-02 ENCOUNTER — Encounter (HOSPITAL_COMMUNITY): Payer: Self-pay | Admitting: Emergency Medicine

## 2016-08-02 ENCOUNTER — Emergency Department (HOSPITAL_COMMUNITY): Payer: Self-pay

## 2016-08-02 DIAGNOSIS — F329 Major depressive disorder, single episode, unspecified: Secondary | ICD-10-CM | POA: Insufficient documentation

## 2016-08-02 DIAGNOSIS — K921 Melena: Secondary | ICD-10-CM | POA: Insufficient documentation

## 2016-08-02 DIAGNOSIS — F17210 Nicotine dependence, cigarettes, uncomplicated: Secondary | ICD-10-CM | POA: Insufficient documentation

## 2016-08-02 DIAGNOSIS — R112 Nausea with vomiting, unspecified: Secondary | ICD-10-CM | POA: Insufficient documentation

## 2016-08-02 DIAGNOSIS — E039 Hypothyroidism, unspecified: Secondary | ICD-10-CM | POA: Insufficient documentation

## 2016-08-02 DIAGNOSIS — Z79899 Other long term (current) drug therapy: Secondary | ICD-10-CM | POA: Insufficient documentation

## 2016-08-02 LAB — URINALYSIS, ROUTINE W REFLEX MICROSCOPIC
Bilirubin Urine: NEGATIVE
Glucose, UA: NEGATIVE mg/dL
Ketones, ur: NEGATIVE mg/dL
LEUKOCYTES UA: NEGATIVE
NITRITE: NEGATIVE
PH: 5.5 (ref 5.0–8.0)
Protein, ur: NEGATIVE mg/dL

## 2016-08-02 LAB — COMPREHENSIVE METABOLIC PANEL
ALBUMIN: 4.1 g/dL (ref 3.5–5.0)
ALK PHOS: 90 U/L (ref 38–126)
ALT: 42 U/L (ref 14–54)
ANION GAP: 8 (ref 5–15)
AST: 51 U/L — AB (ref 15–41)
BILIRUBIN TOTAL: 0.5 mg/dL (ref 0.3–1.2)
BUN: 6 mg/dL (ref 6–20)
CALCIUM: 8.6 mg/dL — AB (ref 8.9–10.3)
CO2: 22 mmol/L (ref 22–32)
Chloride: 101 mmol/L (ref 101–111)
Creatinine, Ser: 0.84 mg/dL (ref 0.44–1.00)
GFR calc Af Amer: >60 mL/min (ref >60)
GLUCOSE: 102 mg/dL — AB (ref 65–99)
POTASSIUM: 3.2 mmol/L — AB (ref 3.5–5.1)
Sodium: 131 mmol/L — ABNORMAL LOW (ref 135–145)
TOTAL PROTEIN: 7.6 g/dL (ref 6.5–8.1)

## 2016-08-02 LAB — URINE MICROSCOPIC-ADD ON: RBC / HPF: NONE SEEN RBC/hpf (ref 0–5)

## 2016-08-02 LAB — CBC WITH DIFFERENTIAL/PLATELET
Basophils Absolute: 0 10*3/uL (ref 0.0–0.1)
Basophils Relative: 1 %
Eosinophils Absolute: 0.3 10*3/uL (ref 0.0–0.7)
Eosinophils Relative: 5 %
HEMATOCRIT: 34.5 % — AB (ref 36.0–46.0)
HEMOGLOBIN: 11 g/dL — AB (ref 12.0–15.0)
LYMPHS PCT: 25 %
Lymphs Abs: 1.5 10*3/uL (ref 0.7–4.0)
MCH: 28.9 pg (ref 26.0–34.0)
MCHC: 31.9 g/dL (ref 30.0–36.0)
MCV: 90.6 fL (ref 78.0–100.0)
MONO ABS: 0.4 10*3/uL (ref 0.1–1.0)
MONOS PCT: 7 %
NEUTROS ABS: 3.9 10*3/uL (ref 1.7–7.7)
NEUTROS PCT: 63 %
Platelets: 311 10*3/uL (ref 150–400)
RBC: 3.81 MIL/uL — ABNORMAL LOW (ref 3.87–5.11)
RDW: 22.9 % — AB (ref 11.5–15.5)
WBC: 6.1 10*3/uL (ref 4.0–10.5)

## 2016-08-02 LAB — POC OCCULT BLOOD, ED: Fecal Occult Bld: POSITIVE — AB

## 2016-08-02 LAB — TYPE AND SCREEN
ABO/RH(D): O POS
Antibody Screen: NEGATIVE

## 2016-08-02 LAB — PREGNANCY, URINE: PREG TEST UR: NEGATIVE

## 2016-08-02 LAB — LIPASE, BLOOD: Lipase: 18 U/L (ref 11–51)

## 2016-08-02 LAB — PROTIME-INR
INR: 0.93
PROTHROMBIN TIME: 12.5 s (ref 11.4–15.2)

## 2016-08-02 MED ORDER — ONDANSETRON HCL 4 MG/2ML IJ SOLN
4.0000 mg | Freq: Once | INTRAMUSCULAR | Status: AC
  Administered 2016-08-02: 4 mg via INTRAVENOUS
  Filled 2016-08-02: qty 2

## 2016-08-02 MED ORDER — FENTANYL CITRATE (PF) 100 MCG/2ML IJ SOLN
50.0000 ug | Freq: Once | INTRAMUSCULAR | Status: AC
  Administered 2016-08-02: 50 ug via INTRAVENOUS
  Filled 2016-08-02: qty 2

## 2016-08-02 MED ORDER — SODIUM CHLORIDE 0.9 % IV BOLUS (SEPSIS)
1000.0000 mL | Freq: Once | INTRAVENOUS | Status: AC
  Administered 2016-08-02: 1000 mL via INTRAVENOUS

## 2016-08-02 NOTE — ED Triage Notes (Signed)
Pt reports dark, tarry stools since yesterday.  Denies other complaints.  Pt's pupils extremely dilated at triage and pt exhibiting slurred speech and slow motor coordination.

## 2016-08-02 NOTE — ED Notes (Signed)
Pt asking this nurse about her hemoglobin. Pt made aware that her hemoglobin was 11.2. Shortly after, this pt requesting for her IV to be taken out. Pt requesting any further testing. Pt advised to stay to have further testing and she said, "I have the right to leave don't I?" Pt was asked to sign the AMA form and pt refused stating, "I don't have to."

## 2016-08-02 NOTE — ED Provider Notes (Signed)
[facility] DEPT Provider Note   CSN: [id number] Arrival date & time: 08/02/16  1651     History   Chief Complaint Chief Complaint  Patient presents with  . Rectal Bleeding    HPI Jacqueline [name] is a 50 y.o. female.  Patient is an alcohol abuser presenting with dark tarry stools for the past several days. States she's been taking NSAIDs due to her recent wrist fracture. She was seen 4 days ago and had a reduction of her right wrist in the emergency Department but left before postreduction x-rays could be obtained. She states she's had 3 alcoholic beverages today. He has an ulcer in the past. She had a couple episodes of emesis several days ago but does not know whether they're red or brown or bloody. Her stools have been dark loose. She does not take iron. She denies any chest pain or shortness of breath. She has mild diffuse abdominal pain. She denies any dizziness or lightheadedness. She is supposed to see orthopedics tomorrow.   The history is provided by the patient.  Rectal Bleeding  Associated symptoms: vomiting   Associated symptoms: no abdominal pain, no dizziness, no fever and no light-headedness     Past Medical History:  Diagnosis Date  . Alcohol abuse   . Alcoholism ([place])   . Anemia   . Anxiety   . Back pain   . Benzodiazepine abuse   . Depression   . Hypoglycemia   . Hypothyroidism   . Narcotic abuse   . Peptic ulcer   . Seizures ([place])   . Suicide attempt    multiple times  . Thrombocytopenia ([place]) 06/17/2011  . Thyroid disease     Patient Active Problem List   Diagnosis Date Noted  . Alcohol withdrawal seizure ([place]) 10/08/2015  . Seizure ([place]) 10/08/2015  . Dysuria 10/08/2015  . Alcohol use disorder, severe, dependence ([place]) 10/04/2015  . Bipolar disorder, current episode depressed, severe, without psychotic features ([place]) 02/17/2015  . Alcoholism ([place])   . Alcohol dependence with withdrawal, uncomplicated ([place]) [id number]  . Intentional  ibuprofen overdose ([place]) 10/17/2014  . Severe recurrent major depression without psychotic features ([place]) 10/17/2014  . Substance induced mood disorder ([place]) 10/17/2014  . Overdose   . Suicidal ideation   . Persistent alcohol intoxication delirium with moderate or severe use disorder ([place]) 12/19/2013  . PTSD (post-traumatic stress disorder) 08/03/2013  . Unspecified episodic mood disorder 05/14/2013  . Hallucinations 04/14/2013  . Anastomotic ulcer, acute 03/23/2013  . Melena 03/21/2013  . Abnormal liver enzymes 12/18/2011  . Cocaine abuse, episodic 12/18/2011    Class: Acute  . PUD (peptic ulcer disease) 12/18/2011  . Anxiety disorder 06/19/2011  . Bacterial vaginosis 06/19/2011  . Anemia 06/17/2011  . Thrombocytopenia ([place]) 06/17/2011  . UTI (urinary tract infection) 06/16/2011  . Hypothyroidism 06/12/2011  . Polysubstance abuse 06/12/2011    Past Surgical History:  Procedure Laterality Date  . ABDOMINAL SURGERY    . CHOLECYSTECTOMY    . ESOPHAGOGASTRODUODENOSCOPY    . ESOPHAGOGASTRODUODENOSCOPY N/A 03/23/2013   Procedure: ESOPHAGOGASTRODUODENOSCOPY (EGD);  Surgeon: Ladene Artist, MD;  Location: [facility];  Service: Endoscopy;  Laterality: N/A;  . GASTRIC BYPASS    . TUBAL LIGATION      OB History    Gravida Para Term Preterm AB Living   3 3 0 3 0 3   SAB TAB Ectopic Multiple Live Births   0 0 0           Home Medications  Prior to Admission medications   Medication Sig Start Date End Date Taking? Authorizing Provider  amitriptyline (ELAVIL) 50 MG tablet Take 100 mg by mouth at bedtime as needed for sleep.    Yes Historical Provider, MD  buPROPion (WELLBUTRIN XL) 300 MG 24 hr tablet Take 300 mg by mouth daily.   Yes Historical Provider, MD  clonazePAM (KLONOPIN) 1 MG tablet Take 1 mg by mouth 3 (three) times daily.   Yes Historical Provider, MD  gabapentin (NEURONTIN) 400 MG capsule Take 2 capsules (800 mg total) by mouth 3 (three) times daily. For  agitation Patient taking differently: Take 800 mg by mouth 3 (three) times daily as needed (pain). For agitation 10/07/15  Yes Encarnacion Slates, NP  hydrochlorothiazide (HYDRODIURIL) 25 MG tablet Take 25 mg by mouth daily.   Yes Historical Provider, MD  levothyroxine (SYNTHROID, LEVOTHROID) 175 MCG tablet Take 1 tablet (175 mcg total) by mouth daily before breakfast. For low thyroid function 10/12/15  Yes Verlee Monte, MD  omeprazole (PRILOSEC) 20 MG capsule Take 40 mg by mouth daily.   Yes Historical Provider, MD  potassium chloride SA (K-DUR,KLOR-CON) 20 MEQ tablet Take 2 tablets (40 mEq total) by mouth daily. 04/24/16  Yes Duffy Bruce, MD  sertraline (ZOLOFT) 100 MG tablet Take 1 tablet (100 mg total) by mouth daily. For depression Patient taking differently: Take 150 mg by mouth daily. For depression 10/07/15  Yes Encarnacion Slates, NP  topiramate (TOPAMAX) 25 MG tablet Take 1 tablet (25 mg total) by mouth 2 (two) times daily. Patient taking differently: Take 75 mg by mouth 2 (two) times daily.  10/12/15  Yes Verlee Monte, MD  amoxicillin (AMOXIL) 500 MG capsule Take 1 capsule (500 mg total) by mouth 3 (three) times daily. Patient not taking: Reported on 06/06/2016 04/24/16   Duffy Bruce, MD  cyclobenzaprine (FLEXERIL) 10 MG tablet Take 1 tablet (10 mg total) by mouth 3 (three) times daily as needed for muscle spasms. Patient not taking: Reported on 07/29/2016 06/23/16   Veryl Speak, MD  HYDROcodone-acetaminophen (NORCO/VICODIN) 5-325 MG tablet Take 1 tablet by mouth every 6 (six) hours as needed for severe pain. Patient not taking: Reported on 06/23/2016 06/06/16   Duffy Bruce, MD  ibuprofen (ADVIL,MOTRIN) 200 MG tablet Take 600-800 mg by mouth every 6 (six) hours as needed for moderate pain.    Historical Provider, MD  predniSONE (STERAPRED UNI-PAK 21 TAB) 10 MG (21) TBPK tablet Take 1 tablet (10 mg total) by mouth daily. Take 6 tabs by mouth daily  for 2 days, then 5 tabs for 2 days, then 4 tabs for  2 days, then 3 tabs for 2 days, 2 tabs for 2 days, then 1 tab by mouth daily for 2 days Patient not taking: Reported on 06/23/2016 06/06/16   Duffy Bruce, MD    Family History Family History  Problem Relation Age of Onset  . Alcohol abuse Father   . Alcoholism Father   . Cancer Other     Social History Social History  Substance Use Topics  . Smoking status: Current Every Day Smoker    Packs/day: 0.50    Types: Cigarettes  . Smokeless tobacco: Never Used  . Alcohol use No     Comment: former     Allergies   Nsaids   Review of Systems Review of Systems  Constitutional: Positive for activity change, appetite change and fatigue. Negative for fever.  HENT: Negative for congestion and rhinorrhea.   Respiratory: Negative for cough,  chest tightness and shortness of breath.   Cardiovascular: Negative for chest pain.  Gastrointestinal: Positive for blood in stool, hematochezia, nausea and vomiting. Negative for abdominal pain.  Genitourinary: Negative for dysuria, hematuria, vaginal bleeding and vaginal discharge.  Musculoskeletal: Negative for arthralgias and myalgias.  Neurological: Positive for weakness. Negative for dizziness and light-headedness.   A complete 10 system review of systems was obtained and all systems are negative except as noted in the HPI and PMH.    Physical Exam Updated Vital Signs BP 141/84 (BP Location: Left Arm)   Pulse 100   Temp 98.1 F (36.7 C) (Oral)   Resp 18   Ht 5\' 9"  (1.753 m)   Wt 210 lb (95.3 kg)   LMP 07/15/2016   SpO2 100%   BMI 31.01 kg/m   Physical Exam  Constitutional: She is oriented to person, place, and time. She appears well-developed and well-nourished. No distress.  HENT:  Head: Normocephalic and atraumatic.  Mouth/Throat: Oropharynx is clear and moist. No oropharyngeal exudate.  Eyes: Conjunctivae and EOM are normal. Pupils are equal, round, and reactive to light.  Neck: Normal range of motion. Neck supple.  No  meningismus.  Cardiovascular: Normal rate, regular rhythm, normal heart sounds and intact distal pulses.   No murmur heard. Pulmonary/Chest: Effort normal and breath sounds normal. No respiratory distress.  Abdominal: Soft. There is no tenderness. There is no rebound and no guarding.  Genitourinary:  Genitourinary Comments: Chaperone present. Melena on exam is Hemoccult positive  Musculoskeletal: Normal range of motion. She exhibits no edema or tenderness.  R wrist splinted. Capillary refill intact, able to wiggle fingers  Neurological: She is alert and oriented to person, place, and time. No cranial nerve deficit. She exhibits normal muscle tone. Coordination normal.  No ataxia on finger to nose bilaterally. No pronator drift. 5/5 strength throughout. CN 2-12 intact.Equal grip strength. Sensation intact.   Skin: Skin is warm.  Psychiatric: She has a normal mood and affect. Her behavior is normal.  Nursing note and vitals reviewed.    ED Treatments / Results  Labs (all labs ordered are listed, but only abnormal results are displayed) Labs Reviewed  CBC WITH DIFFERENTIAL/PLATELET - Abnormal; Notable for the following:       Result Value   RBC 3.81 (*)    Hemoglobin 11.0 (*)    HCT 34.5 (*)    RDW 22.9 (*)    All other components within normal limits  COMPREHENSIVE METABOLIC PANEL - Abnormal; Notable for the following:    Sodium 131 (*)    Potassium 3.2 (*)    Glucose, Bld 102 (*)    Calcium 8.6 (*)    AST 51 (*)    All other components within normal limits  URINALYSIS, ROUTINE W REFLEX MICROSCOPIC (NOT AT [facility]) - Abnormal; Notable for the following:    Color, Urine STRAW (*)    Specific Gravity, Urine <1.005 (*)    Hgb urine dipstick LARGE (*)    All other components within normal limits  URINE MICROSCOPIC-ADD ON - Abnormal; Notable for the following:    Squamous Epithelial / LPF 0-5 (*)    Bacteria, UA FEW (*)    All other components within normal limits  POC OCCULT BLOOD,  ED - Abnormal; Notable for the following:    Fecal Occult Bld POSITIVE (*)    All other components within normal limits  PROTIME-INR  LIPASE, BLOOD  PREGNANCY, URINE  TYPE AND SCREEN    EKG  EKG  Interpretation None       Radiology No results found.  Procedures Procedures (including critical care time)  Medications Ordered in ED Medications  sodium chloride 0.9 % bolus 1,000 mL (not administered)  ondansetron (ZOFRAN) injection 4 mg (not administered)     Initial Impression / Assessment and Plan / ED Course  I have reviewed the triage vital signs and the nursing notes.  Pertinent labs & imaging results that were available during my care of the patient were reviewed by me and considered in my medical decision making (see chart for details).  Clinical Course   Patient with several days of melena as well as vomiting of uncertain color. Minimal abdominal pain. No peritoneal signs.  EGD in 2014 showed marginal ulcer in duodenum  Hemoglobin stable at 11.  Informed by nursing staff that patient demanded her IV be taken out and she left the ED. She refused to stay for further testing. Patient left AGAINST MEDICAL ADVICE.  Plan was to obtain postreduction x-ray of her arm as well as probable observation due to her melena and history of ulcer. Patient left the ED before I could speak with her.    Final Clinical Impressions(s) / ED Diagnoses   Final diagnoses:  Melena    New Prescriptions New Prescriptions   No medications on file     Ezequiel Essex, MD 08/02/16 2338

## 2016-08-02 NOTE — ED Notes (Signed)
Attempted IV access times one to left hand without success.  Unable to get IV catheter to thread.

## 2016-08-03 ENCOUNTER — Ambulatory Visit (INDEPENDENT_AMBULATORY_CARE_PROVIDER_SITE_OTHER): Payer: Self-pay

## 2016-08-03 ENCOUNTER — Encounter: Payer: Self-pay | Admitting: Orthopedic Surgery

## 2016-08-03 ENCOUNTER — Ambulatory Visit (INDEPENDENT_AMBULATORY_CARE_PROVIDER_SITE_OTHER): Payer: Self-pay | Admitting: Orthopedic Surgery

## 2016-08-03 VITALS — BP 133/92 | HR 85 | Wt 205.0 lb

## 2016-08-03 DIAGNOSIS — S62101A Fracture of unspecified carpal bone, right wrist, initial encounter for closed fracture: Secondary | ICD-10-CM

## 2016-08-03 MED ORDER — HYDROCODONE-ACETAMINOPHEN 5-325 MG PO TABS: 1.0000 | ORAL_TABLET | Freq: Four times a day (QID) | ORAL | 0 refills | Status: DC | PRN
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
## 2016-08-14 ENCOUNTER — Telehealth: Payer: Self-pay | Admitting: Orthopedic Surgery

## 2016-08-14 NOTE — Telephone Encounter (Signed)
APPROPRIATE RESPONSE

## 2016-08-14 NOTE — Telephone Encounter (Signed)
Patient called to relay she is having pain in the wrist (fracture, seen here 08/03/16); states sometimes she is "having to use the injured arm"; said lives alone.  Encouraged patient to keep in sling and not "use" as advised at time of visit. Please call patient to further advise - ph#[phone]

## 2016-08-14 NOTE — Telephone Encounter (Signed)
Routing to Dr Harrison 

## 2016-08-29 ENCOUNTER — Emergency Department (HOSPITAL_COMMUNITY)
Admission: EM | Admit: 2016-08-29 | Discharge: 2016-08-29 | Disposition: A | Discharge status: Home / Self Care | Payer: Self-pay | Attending: Emergency Medicine | Admitting: Emergency Medicine

## 2016-08-29 ENCOUNTER — Encounter (HOSPITAL_COMMUNITY): Payer: Self-pay | Admitting: Emergency Medicine

## 2016-08-29 DIAGNOSIS — F10129 Alcohol abuse with intoxication, unspecified: Secondary | ICD-10-CM | POA: Insufficient documentation

## 2016-08-29 DIAGNOSIS — F17210 Nicotine dependence, cigarettes, uncomplicated: Secondary | ICD-10-CM | POA: Insufficient documentation

## 2016-08-29 DIAGNOSIS — E039 Hypothyroidism, unspecified: Secondary | ICD-10-CM | POA: Insufficient documentation

## 2016-08-29 DIAGNOSIS — Z79899 Other long term (current) drug therapy: Secondary | ICD-10-CM | POA: Insufficient documentation

## 2016-08-29 DIAGNOSIS — F10920 Alcohol use, unspecified with intoxication, uncomplicated: Secondary | ICD-10-CM

## 2016-08-29 LAB — CBC WITH DIFFERENTIAL/PLATELET
BASOS ABS: 0.1 10*3/uL (ref 0.0–0.1)
BASOS PCT: 1 %
EOS PCT: 6 %
Eosinophils Absolute: 0.4 10*3/uL (ref 0.0–0.7)
HEMATOCRIT: 35 % — AB (ref 36.0–46.0)
Hemoglobin: 11.1 g/dL — ABNORMAL LOW (ref 12.0–15.0)
Lymphocytes Relative: 37 %
Lymphs Abs: 2.3 10*3/uL (ref 0.7–4.0)
MCH: 29.8 pg (ref 26.0–34.0)
MCHC: 31.7 g/dL (ref 30.0–36.0)
MCV: 93.8 fL (ref 78.0–100.0)
MONO ABS: 0.5 10*3/uL (ref 0.1–1.0)
Monocytes Relative: 8 %
NEUTROS ABS: 3 10*3/uL (ref 1.7–7.7)
Neutrophils Relative %: 48 %
PLATELETS: 276 10*3/uL (ref 150–400)
RBC: 3.73 MIL/uL — ABNORMAL LOW (ref 3.87–5.11)
RDW: 21.4 % — AB (ref 11.5–15.5)
WBC: 6.3 10*3/uL (ref 4.0–10.5)

## 2016-08-29 LAB — COMPREHENSIVE METABOLIC PANEL
ALBUMIN: 3.9 g/dL (ref 3.5–5.0)
ALT: 25 U/L (ref 14–54)
AST: 33 U/L (ref 15–41)
Alkaline Phosphatase: 110 U/L (ref 38–126)
Anion gap: 10 (ref 5–15)
BUN: 6 mg/dL (ref 6–20)
CHLORIDE: 104 mmol/L (ref 101–111)
CO2: 22 mmol/L (ref 22–32)
Calcium: 8.5 mg/dL — ABNORMAL LOW (ref 8.9–10.3)
Creatinine, Ser: 0.83 mg/dL (ref 0.44–1.00)
GFR calc Af Amer: >60 mL/min (ref >60)
GFR calc non Af Amer: >60 mL/min (ref >60)
GLUCOSE: 106 mg/dL — AB (ref 65–99)
POTASSIUM: 3.1 mmol/L — AB (ref 3.5–5.1)
Sodium: 136 mmol/L (ref 135–145)
Total Bilirubin: 0.2 mg/dL — ABNORMAL LOW (ref 0.3–1.2)
Total Protein: 7.2 g/dL (ref 6.5–8.1)

## 2016-08-29 LAB — ETHANOL: Alcohol, Ethyl (B): 255 mg/dL — ABNORMAL HIGH (ref <5)

## 2016-08-29 LAB — ACETAMINOPHEN LEVEL

## 2016-08-29 LAB — SALICYLATE LEVEL: Salicylate Lvl: <7 mg/dL (ref 2.8–30.0)

## 2016-08-29 NOTE — ED Provider Notes (Signed)
[facility] DEPT Provider Note   CSN: [id number] Arrival date & time: 08/29/16  1703     History   Chief Complaint Chief Complaint  Patient presents with  . Alcohol Intoxication    HPI Jacqueline [name] is a 50 y.o. female.  HPI  The patient is a 50 year old female, she has a history of alcohol abuse, seizures, anemia, drug abuse, recent distal radial fracture which was fixed and immobilized with a cast,  Also has a history of suicide attempts, thrombus at a pia, peptic ulcer disease, hypoglycemia and thyroid disease.  Reportedly the patient was having abnormal behavior today. She has become more withdrawn since finding out that her sister has cancer, there is a question as to whether the patient was drinking heavy amounts of alcohol today. Evidently her Alcoholics Anonymous sponsor brought her to the hospital because they felt that she was acting abnormally and question severe intoxication versus other source of altered mental status. There was no reported history of seizures prehospital today. The patient is unable to give me any information because of slurring her speech and somnolence.  Onset of the mental status is unclear S I am unable to get a hold of any prehospital people  Past Medical History:  Diagnosis Date  . Alcohol abuse   . Alcoholism ([place])   . Anemia   . Anxiety   . Back pain   . Benzodiazepine abuse   . Depression   . Hypoglycemia   . Hypothyroidism   . Narcotic abuse   . Peptic ulcer   . Seizures ([place])   . Suicide attempt    multiple times  . Thrombocytopenia ([place]) 06/17/2011  . Thyroid disease     Patient Active Problem List   Diagnosis Date Noted  . Alcohol withdrawal seizure ([place]) 10/08/2015  . Seizure ([place]) 10/08/2015  . Dysuria 10/08/2015  . Alcohol use disorder, severe, dependence ([place]) 10/04/2015  . Bipolar disorder, current episode depressed, severe, without psychotic features ([place]) 02/17/2015  . Alcoholism ([place])   . Alcohol  dependence with withdrawal, uncomplicated ([place]) [id number]  . Intentional ibuprofen overdose ([place]) 10/17/2014  . Severe recurrent major depression without psychotic features ([place]) 10/17/2014  . Substance induced mood disorder ([place]) 10/17/2014  . Overdose   . Suicidal ideation   . Persistent alcohol intoxication delirium with moderate or severe use disorder ([place]) 12/19/2013  . PTSD (post-traumatic stress disorder) 08/03/2013  . Unspecified episodic mood disorder 05/14/2013  . Hallucinations 04/14/2013  . Anastomotic ulcer, acute 03/23/2013  . Melena 03/21/2013  . Abnormal liver enzymes 12/18/2011  . Cocaine abuse, episodic 12/18/2011    Class: Acute  . PUD (peptic ulcer disease) 12/18/2011  . Anxiety disorder 06/19/2011  . Bacterial vaginosis 06/19/2011  . Anemia 06/17/2011  . Thrombocytopenia ([place]) 06/17/2011  . UTI (urinary tract infection) 06/16/2011  . Hypothyroidism 06/12/2011  . Polysubstance abuse 06/12/2011    Past Surgical History:  Procedure Laterality Date  . ABDOMINAL SURGERY    . CHOLECYSTECTOMY    . ESOPHAGOGASTRODUODENOSCOPY    . ESOPHAGOGASTRODUODENOSCOPY N/A 03/23/2013   Procedure: ESOPHAGOGASTRODUODENOSCOPY (EGD);  Surgeon: Ladene Artist, MD;  Location: [facility];  Service: Endoscopy;  Laterality: N/A;  . GASTRIC BYPASS    . TUBAL LIGATION      OB History    Gravida Para Term Preterm AB Living   3 3 0 3 0 3   SAB TAB Ectopic Multiple Live Births   0 0 0  Home Medications    Prior to Admission medications   Medication Sig Start Date End Date Taking? Authorizing Provider  amitriptyline (ELAVIL) 50 MG tablet Take 100 mg by mouth at bedtime as needed for sleep.    Yes Historical Provider, MD  clonazePAM (KLONOPIN) 1 MG tablet Take 1 mg by mouth 3 (three) times daily.   Yes Historical Provider, MD  gabapentin (NEURONTIN) 400 MG capsule Take 2 capsules (800 mg total) by mouth 3 (three) times daily. For agitation Patient taking differently:  Take 800 mg by mouth 3 (three) times daily as needed (pain). For agitation 10/07/15  Yes Encarnacion Slates, NP  levothyroxine (SYNTHROID, LEVOTHROID) 175 MCG tablet Take 1 tablet (175 mcg total) by mouth daily before breakfast. For low thyroid function 10/12/15  Yes Verlee Monte, MD  omeprazole (PRILOSEC) 20 MG capsule Take 40 mg by mouth daily.   Yes Historical Provider, MD  sertraline (ZOLOFT) 100 MG tablet Take 1 tablet (100 mg total) by mouth daily. For depression Patient taking differently: Take 150 mg by mouth daily. For depression 10/07/15  Yes Encarnacion Slates, NP  topiramate (TOPAMAX) 25 MG tablet Take 1 tablet (25 mg total) by mouth 2 (two) times daily. Patient taking differently: Take 75 mg by mouth 2 (two) times daily.  10/12/15  Yes Verlee Monte, MD    Family History Family History  Problem Relation Age of Onset  . Alcohol abuse Father   . Alcoholism Father   . Cancer Other     Social History Social History  Substance Use Topics  . Smoking status: Current Every Day Smoker    Packs/day: 0.50    Types: Cigarettes  . Smokeless tobacco: Never Used  . Alcohol use No     Comment: former     Allergies   Nsaids   Review of Systems Review of Systems  Unable to perform ROS: Mental status change     Physical Exam Updated Vital Signs BP 101/60   Pulse 76   Temp 98.2 F (36.8 C) (Oral)   Resp 20   LMP 08/21/2016   SpO2 97%   Physical Exam  Constitutional: She appears well-developed and well-nourished. No distress.  HENT:  Head: Normocephalic and atraumatic.  Mouth/Throat: Oropharynx is clear and moist. No oropharyngeal exudate.  Eyes: Conjunctivae and EOM are normal. Pupils are equal, round, and reactive to light. Right eye exhibits no discharge. Left eye exhibits no discharge. No scleral icterus.  Pupils are very large but reactive bilaterally  Neck: Normal range of motion. Neck supple. No JVD present. No thyromegaly present.  Cardiovascular: Normal rate, regular  rhythm, normal heart sounds and intact distal pulses.  Exam reveals no gallop and no friction rub.   No murmur heard. Pulmonary/Chest: Effort normal and breath sounds normal. No respiratory distress. She has no wheezes. She has no rales.  Abdominal: Soft. Bowel sounds are normal. She exhibits no distension and no mass. There is no tenderness.  Musculoskeletal: Normal range of motion. She exhibits no edema or tenderness.  Lymphadenopathy:    She has no cervical adenopathy.  Neurological:  Somnolent but arousable to loud voice, follows commands very slowly, appears to use both arms, both legs, is able to sit up with some assistance. Slurred speech is present, no facial droop  Skin: Skin is warm and dry. No rash noted. No erythema.  Psychiatric:  Somnolent  Nursing note and vitals reviewed.    ED Treatments / Results  Labs (all labs ordered are listed, but  only abnormal results are displayed) Labs Reviewed  CBC WITH DIFFERENTIAL/PLATELET - Abnormal; Notable for the following:       Result Value   RBC 3.73 (*)    Hemoglobin 11.1 (*)    HCT 35.0 (*)    RDW 21.4 (*)    All other components within normal limits  COMPREHENSIVE METABOLIC PANEL - Abnormal; Notable for the following:    Potassium 3.1 (*)    Glucose, Bld 106 (*)    Calcium 8.5 (*)    Total Bilirubin 0.2 (*)    All other components within normal limits  ETHANOL - Abnormal; Notable for the following:    Alcohol, Ethyl (B) 255 (*)    All other components within normal limits  ACETAMINOPHEN LEVEL - Abnormal; Notable for the following:    Acetaminophen (Tylenol), Serum <10 (*)    All other components within normal limits  SALICYLATE LEVEL    EKG  EKG Interpretation  Date/Time:  Wednesday August 29 2016 18:41:21 EST Ventricular Rate:  68 PR Interval:    QRS Duration: 98 QT Interval:  473 QTC Calculation: 504 R Axis:   86 Text Interpretation:  Sinus rhythm Borderline prolonged QT interval since last tracing no  significant change Confirmed by Reshawn Ostlund  MD, Clovis Mankins ([id number]) on 08/29/2016 6:46:52 PM       Radiology No results found.  Procedures Procedures (including critical care time)  Medications Ordered in ED Medications - No data to display   Initial Impression / Assessment and Plan / ED Course  I have reviewed the triage vital signs and the nursing notes.  Pertinent labs & imaging results that were available during my care of the patient were reviewed by me and considered in my medical decision making (see chart for details).  Clinical Course    I question whether the patient has taken an intentional overdose of medications, potential suicide attempt, potentially just having alcohol intoxication, the etiology is unclear thus multiple labs have been ordered to rule out different sources. There is no corroborating information to give [name] any more clarity at this time will hold  At 8:15, pt is now awake and alert - she is not intoxicated at this time - no coingestants of concern, requesting detox, has made applications for some programs - will get copy of labs to go home with - she is in agreement.   Final Clinical Impressions(s) / ED Diagnoses   Final diagnoses:  Alcoholic intoxication without complication [employer])    New Prescriptions Current Discharge Medication List       Noemi Chapel, MD 08/29/16 2016

## 2016-08-29 NOTE — Discharge Instructions (Signed)
Substance Abuse Treatment Programs ° °Intensive Outpatient Programs °[facility]     °[address]      °[place], [place]                   °[phone]      ° °[facility] °[address] °[place], [place] °[phone] ° °[facility]     °(Inpatient and outpatient)     °[address]           °[phone]   ° °[facility] °[phone] (Suboxone and Methadone) ° °[address]      °[place], [place] [address]      °[phone]      ° °[address] °[place], [place] °852-3033 ° °[facility] (Outpatient/Inpatient, [facility])    °(insurance only) [phone]      °       °[facility] (Groups & Residential) °[place], [place] °[phone] ° °   °[facility]     °[address]     °[place], [place]      °[phone]      ° °[facility] (for caregivers and family) °[address] °[place], [place] °336-299-4655 ° ° ° ° ° °Residential Treatment Programs °[facility]      °[address], [place], [place] [address]  °([phone]      ° °[facility] °[address] [place], [place] [address] °[phone] ° °[facility]        °336-248-8914      ° °[facility] °1-800-659-3381 ° °[facility]             °[address]                                         °[place], [place]                                                °877-615-2722 or 336-784-9470                              ° °[facility] [place] °[address] °[place], [address] °[phone] ° °[facility]    °[address]      °, [place]     °[phone]      ° °[facility] °[address] °, [place] °336-285-9073 ° °[facility]   °[address]     °[place], [place] [address]     °[phone]      °Admissions: 8am-3pm M-F ° °[facility] °[address] °[place],  [place] °336-227-7417 ° °[facility]: Residential Program (90 Days)   °[place], [place]      °336-725-8389 or [phone]    ° °[facility] °[place], [place] °(Walk in Hours over the weekend or by referral) ° °[facility] °[address], [place],  [address] °([phone] ° °Crisis Mobile: [facility]:  1-877-626-1772 (for crisis response 24 hours a day) °[facility] Hotline:      1-800-256-2452 °Outpatient Psychiatry and Counseling ° °[facility]: Mobile Crisis   Management 24 hours:  1-877-626-1772 ° °[facility] sliding scale fee and walk in schedule: M-F 8am-12pm/1pm-3pm °[address]  °[place], [place] [address] °[phone] ° °[facility] °[address] °[place], [place] [address] °336-703-9650 ° °[facility] ([facility])- new patient walk-in appointments available Monday - Friday 8am -3pm.          °[address] °[place], [place] [address] °336-676-6840 or crisis line- 336-676-6905 ° °[facility]/ Intensive Outpatient Therapy Program °[address] °[place], [place] [address] °336-832-9804 ° °[facility]                  °Crisis Services      °[phone]      °[address]     °[place], [place] [address]                ° °[facility]   °[facility] °[phone] °[address] °[place], [place] [address] ° ° °[facility]          °[address]  °[place], [place] [address]       °([phone] ° °[facility] °[address] °[place], [place] [address] °336-292-1510 ° °[facility]    °[address]    °[place], [place] [address]     °[phone]      ° °Parish McKinney, MD     °[address]     °[place] [address]     °336-282-1251     °  °[facility] °[address] °[place] [address] ° °[facility]     °[address]     °[place], [place]      °336-542-2076      ° °[facility] °Shamsher Ahluwalia, MD °[address] °[place], [place] [address] °[phone] ° °[facility]     °[address]     °[place], [place] [address]     °336-274-1237      ° °[facility] °[address] °[place], [place] [address] °336-854-4450 °Resources for Temporary Residential Assistance/Crisis Centers ° °DAY CENTERS °[facility] °M-F 8am-3pm   °[address] [place], [place] [address]   336-332-0824 °Services include: laundry, barbering, support groups, case management, phone  & computer access, showers, AA/NA mtgs, mental health/substance abuse nurse, job skills class, disability information, VA assistance, spiritual classes, etc.  ° °HOMELESS SHELTERS ° °[facility]     °[facility]   °[address], [place]     °[phone]       °       °[facility] (women and children)       °[address] °[place], [place] [address] °336-275-0820 °[email or web address] for application and process °Application Required ° °[facility]   °[address]    °[place] [address]     °[phone]       °             °[facility] °[address] °[place],  [address] °336.273.5572 °336-235-0363(schedule application appt.) °Application Required ° °[facility] (women only)    °[address]     °[place],  [address]     °336-884-1039      °  Intake starts 6pm daily °Need valid ID, SSC, & Police report °[facility] [place] °[address] °[place], [place] °336-881-5420 °Application Required ° °[facility] (men only)     °[address]      °[place], [place]     °[phone]      ° °[facility] °(Pregnant women only) °[address] °[place], [place] °336-275-0206 ° °[facility]      °[address]      °[place], [place] [address]     °[phone]      °       °[facility] [facility] °[address] °[place], [place] °336-723-1848 °90 day commitment/SA/Application process ° °[facility](men only)     °[address]     °[place], [place]     °[phone]       °Check-in at 7pm     °       °[facility] °[address] °[place], [place] [address] °336-248-6684 °Men/Women/Women and Children must be there by 7 pm ° °[facility] °[place],  °336-722-8721                ° °

## 2016-08-29 NOTE — ED Notes (Signed)
Pt taken to bathroom via WC. Pt ambulatory back to room. Gait steady.

## 2016-08-29 NOTE — ED Triage Notes (Signed)
Pt states she wants to stop drinking, "its killing me and I don't want to die."

## 2016-09-07 ENCOUNTER — Encounter: Payer: Self-pay | Admitting: Orthopedic Surgery

## 2016-09-07 ENCOUNTER — Ambulatory Visit (INDEPENDENT_AMBULATORY_CARE_PROVIDER_SITE_OTHER): Payer: Self-pay

## 2016-09-07 ENCOUNTER — Ambulatory Visit (INDEPENDENT_AMBULATORY_CARE_PROVIDER_SITE_OTHER): Payer: Self-pay | Admitting: Orthopedic Surgery

## 2016-09-07 DIAGNOSIS — S62101D Fracture of unspecified carpal bone, right wrist, subsequent encounter for fracture with routine healing: Secondary | ICD-10-CM

## 2016-09-07 NOTE — Progress Notes (Signed)
Patient ID: Jacqueline [name], female   DOB: 21-Nov-1965, 50 y.o.   MRN: [id number]  Chief Complaint  Patient presents with  . Follow-up    right wrist fracture, DOI 07/29/16    HPI Jacqueline [name] is a 50 y.o. female.   HPI  Fracture care follow-up right wrist volarly displaced fracture. Patient is been in a cast now for 4 weeks injury happened 53 days ago so she 7 weeks out. Pain is much better today  Review of Systems Review of Systems   Physical Exam  clinically the ulna is prominent secondary to shortening seen on x-ray.  Again she is not a good surgical candidate   Physical Exam  Remaining exam shows a very drowsy patient who is oriented to person and place  Skin dry flaky skin secondary to casting  Recommend splint for 4 weeks come back for repeat exam  Not a good surgical candidate

## 2016-09-11 ENCOUNTER — Emergency Department (HOSPITAL_COMMUNITY): Payer: Medicaid Other

## 2016-09-11 ENCOUNTER — Inpatient Hospital Stay (HOSPITAL_COMMUNITY)
Admission: EM | Admit: 2016-09-11 | Discharge: 2016-10-09 | DRG: 003 | Disposition: A | Discharge status: Rehabilitation Facility | Payer: Medicaid Other | Attending: General Surgery | Admitting: General Surgery

## 2016-09-11 DIAGNOSIS — S299XXA Unspecified injury of thorax, initial encounter: Secondary | ICD-10-CM

## 2016-09-11 DIAGNOSIS — N390 Urinary tract infection, site not specified: Secondary | ICD-10-CM | POA: Diagnosis not present

## 2016-09-11 DIAGNOSIS — R7881 Bacteremia: Secondary | ICD-10-CM

## 2016-09-11 DIAGNOSIS — F10221 Alcohol dependence with intoxication delirium: Secondary | ICD-10-CM | POA: Diagnosis present

## 2016-09-11 DIAGNOSIS — E870 Hyperosmolality and hypernatremia: Secondary | ICD-10-CM | POA: Diagnosis not present

## 2016-09-11 DIAGNOSIS — S06369A Traumatic hemorrhage of cerebrum, unspecified, with loss of consciousness of unspecified duration, initial encounter: Principal | ICD-10-CM | POA: Diagnosis present

## 2016-09-11 DIAGNOSIS — D62 Acute posthemorrhagic anemia: Secondary | ICD-10-CM | POA: Diagnosis present

## 2016-09-11 DIAGNOSIS — E876 Hypokalemia: Secondary | ICD-10-CM | POA: Diagnosis not present

## 2016-09-11 DIAGNOSIS — K567 Ileus, unspecified: Secondary | ICD-10-CM | POA: Diagnosis not present

## 2016-09-11 DIAGNOSIS — K639 Disease of intestine, unspecified: Secondary | ICD-10-CM | POA: Diagnosis present

## 2016-09-11 DIAGNOSIS — J9690 Respiratory failure, unspecified, unspecified whether with hypoxia or hypercapnia: Secondary | ICD-10-CM

## 2016-09-11 DIAGNOSIS — R1310 Dysphagia, unspecified: Secondary | ICD-10-CM | POA: Diagnosis present

## 2016-09-11 DIAGNOSIS — F1910 Other psychoactive substance abuse, uncomplicated: Secondary | ICD-10-CM | POA: Diagnosis not present

## 2016-09-11 DIAGNOSIS — I159 Secondary hypertension, unspecified: Secondary | ICD-10-CM | POA: Diagnosis present

## 2016-09-11 DIAGNOSIS — J1520 Pneumonia due to staphylococcus, unspecified: Secondary | ICD-10-CM | POA: Diagnosis not present

## 2016-09-11 DIAGNOSIS — M25511 Pain in right shoulder: Secondary | ICD-10-CM

## 2016-09-11 DIAGNOSIS — I158 Other secondary hypertension: Secondary | ICD-10-CM

## 2016-09-11 DIAGNOSIS — R739 Hyperglycemia, unspecified: Secondary | ICD-10-CM

## 2016-09-11 DIAGNOSIS — G960 Cerebrospinal fluid leak: Secondary | ICD-10-CM | POA: Diagnosis present

## 2016-09-11 DIAGNOSIS — K921 Melena: Secondary | ICD-10-CM | POA: Diagnosis not present

## 2016-09-11 DIAGNOSIS — F332 Major depressive disorder, recurrent severe without psychotic features: Secondary | ICD-10-CM

## 2016-09-11 DIAGNOSIS — F4310 Post-traumatic stress disorder, unspecified: Secondary | ICD-10-CM | POA: Diagnosis present

## 2016-09-11 DIAGNOSIS — Z4659 Encounter for fitting and adjustment of other gastrointestinal appliance and device: Secondary | ICD-10-CM

## 2016-09-11 DIAGNOSIS — S02109A Fracture of base of skull, unspecified side, initial encounter for closed fracture: Secondary | ICD-10-CM

## 2016-09-11 DIAGNOSIS — Z79899 Other long term (current) drug therapy: Secondary | ICD-10-CM

## 2016-09-11 DIAGNOSIS — R52 Pain, unspecified: Secondary | ICD-10-CM

## 2016-09-11 DIAGNOSIS — D72829 Elevated white blood cell count, unspecified: Secondary | ICD-10-CM | POA: Diagnosis not present

## 2016-09-11 DIAGNOSIS — Z9884 Bariatric surgery status: Secondary | ICD-10-CM

## 2016-09-11 DIAGNOSIS — F068 Other specified mental disorders due to known physiological condition: Secondary | ICD-10-CM | POA: Diagnosis not present

## 2016-09-11 DIAGNOSIS — D689 Coagulation defect, unspecified: Secondary | ICD-10-CM | POA: Diagnosis present

## 2016-09-11 DIAGNOSIS — J9811 Atelectasis: Secondary | ICD-10-CM | POA: Diagnosis present

## 2016-09-11 DIAGNOSIS — Z930 Tracheostomy status: Secondary | ICD-10-CM

## 2016-09-11 DIAGNOSIS — S064X9A Epidural hemorrhage with loss of consciousness of unspecified duration, initial encounter: Secondary | ICD-10-CM | POA: Diagnosis present

## 2016-09-11 DIAGNOSIS — F319 Bipolar disorder, unspecified: Secondary | ICD-10-CM | POA: Diagnosis present

## 2016-09-11 DIAGNOSIS — R40243 Glasgow coma scale score 3-8, unspecified time: Secondary | ICD-10-CM | POA: Diagnosis present

## 2016-09-11 DIAGNOSIS — S0219XA Other fracture of base of skull, initial encounter for closed fracture: Secondary | ICD-10-CM | POA: Diagnosis present

## 2016-09-11 DIAGNOSIS — S27321A Contusion of lung, unilateral, initial encounter: Secondary | ICD-10-CM | POA: Diagnosis not present

## 2016-09-11 DIAGNOSIS — Y92410 Unspecified street and highway as the place of occurrence of the external cause: Secondary | ICD-10-CM | POA: Diagnosis not present

## 2016-09-11 DIAGNOSIS — I959 Hypotension, unspecified: Secondary | ICD-10-CM | POA: Diagnosis present

## 2016-09-11 DIAGNOSIS — I272 Pulmonary hypertension, unspecified: Secondary | ICD-10-CM | POA: Diagnosis not present

## 2016-09-11 DIAGNOSIS — S069X9A Unspecified intracranial injury with loss of consciousness of unspecified duration, initial encounter: Secondary | ICD-10-CM

## 2016-09-11 DIAGNOSIS — G479 Sleep disorder, unspecified: Secondary | ICD-10-CM | POA: Diagnosis not present

## 2016-09-11 DIAGNOSIS — Z781 Physical restraint status: Secondary | ICD-10-CM

## 2016-09-11 DIAGNOSIS — R451 Restlessness and agitation: Secondary | ICD-10-CM | POA: Diagnosis not present

## 2016-09-11 DIAGNOSIS — T814XXS Infection following a procedure, sequela: Secondary | ICD-10-CM | POA: Diagnosis not present

## 2016-09-11 DIAGNOSIS — E874 Mixed disorder of acid-base balance: Secondary | ICD-10-CM | POA: Diagnosis not present

## 2016-09-11 DIAGNOSIS — T1490XA Injury, unspecified, initial encounter: Secondary | ICD-10-CM

## 2016-09-11 DIAGNOSIS — N170 Acute kidney failure with tubular necrosis: Secondary | ICD-10-CM | POA: Diagnosis not present

## 2016-09-11 DIAGNOSIS — K922 Gastrointestinal hemorrhage, unspecified: Secondary | ICD-10-CM

## 2016-09-11 DIAGNOSIS — S064XAA Epidural hemorrhage with loss of consciousness status unknown, initial encounter: Secondary | ICD-10-CM

## 2016-09-11 DIAGNOSIS — Y908 Blood alcohol level of 240 mg/100 ml or more: Secondary | ICD-10-CM | POA: Diagnosis present

## 2016-09-11 DIAGNOSIS — L8990 Pressure ulcer of unspecified site, unspecified stage: Secondary | ICD-10-CM | POA: Insufficient documentation

## 2016-09-11 DIAGNOSIS — Z8711 Personal history of peptic ulcer disease: Secondary | ICD-10-CM

## 2016-09-11 DIAGNOSIS — F17210 Nicotine dependence, cigarettes, uncomplicated: Secondary | ICD-10-CM | POA: Diagnosis present

## 2016-09-11 DIAGNOSIS — S062X4S Diffuse traumatic brain injury with loss of consciousness of 6 hours to 24 hours, sequela: Secondary | ICD-10-CM | POA: Diagnosis not present

## 2016-09-11 DIAGNOSIS — S065X9A Traumatic subdural hemorrhage with loss of consciousness of unspecified duration, initial encounter: Secondary | ICD-10-CM | POA: Diagnosis not present

## 2016-09-11 DIAGNOSIS — F419 Anxiety disorder, unspecified: Secondary | ICD-10-CM | POA: Diagnosis present

## 2016-09-11 DIAGNOSIS — R0682 Tachypnea, not elsewhere classified: Secondary | ICD-10-CM

## 2016-09-11 DIAGNOSIS — Z0189 Encounter for other specified special examinations: Secondary | ICD-10-CM

## 2016-09-11 DIAGNOSIS — S14109A Unspecified injury at unspecified level of cervical spinal cord, initial encounter: Secondary | ICD-10-CM | POA: Diagnosis not present

## 2016-09-11 DIAGNOSIS — Z298 Encounter for other specified prophylactic measures: Secondary | ICD-10-CM | POA: Diagnosis not present

## 2016-09-11 DIAGNOSIS — E039 Hypothyroidism, unspecified: Secondary | ICD-10-CM | POA: Diagnosis present

## 2016-09-11 DIAGNOSIS — I469 Cardiac arrest, cause unspecified: Secondary | ICD-10-CM | POA: Diagnosis present

## 2016-09-11 DIAGNOSIS — T1491XA Suicide attempt, initial encounter: Secondary | ICD-10-CM

## 2016-09-11 DIAGNOSIS — T8189XD Other complications of procedures, not elsewhere classified, subsequent encounter: Secondary | ICD-10-CM | POA: Diagnosis not present

## 2016-09-11 DIAGNOSIS — S2241XA Multiple fractures of ribs, right side, initial encounter for closed fracture: Secondary | ICD-10-CM | POA: Diagnosis not present

## 2016-09-11 DIAGNOSIS — K5901 Slow transit constipation: Secondary | ICD-10-CM | POA: Diagnosis not present

## 2016-09-11 DIAGNOSIS — B961 Klebsiella pneumoniae [K. pneumoniae] as the cause of diseases classified elsewhere: Secondary | ICD-10-CM | POA: Diagnosis present

## 2016-09-11 DIAGNOSIS — T814XXD Infection following a procedure, subsequent encounter: Secondary | ICD-10-CM | POA: Diagnosis not present

## 2016-09-11 DIAGNOSIS — E861 Hypovolemia: Secondary | ICD-10-CM | POA: Diagnosis present

## 2016-09-11 DIAGNOSIS — R110 Nausea: Secondary | ICD-10-CM

## 2016-09-11 DIAGNOSIS — I10 Essential (primary) hypertension: Secondary | ICD-10-CM | POA: Diagnosis present

## 2016-09-11 DIAGNOSIS — I952 Hypotension due to drugs: Secondary | ICD-10-CM | POA: Diagnosis not present

## 2016-09-11 DIAGNOSIS — I472 Ventricular tachycardia: Secondary | ICD-10-CM | POA: Diagnosis not present

## 2016-09-11 DIAGNOSIS — J9600 Acute respiratory failure, unspecified whether with hypoxia or hypercapnia: Secondary | ICD-10-CM | POA: Diagnosis not present

## 2016-09-11 DIAGNOSIS — K6389 Other specified diseases of intestine: Secondary | ICD-10-CM

## 2016-09-11 DIAGNOSIS — I471 Supraventricular tachycardia: Secondary | ICD-10-CM | POA: Diagnosis not present

## 2016-09-11 DIAGNOSIS — S069X0S Unspecified intracranial injury without loss of consciousness, sequela: Secondary | ICD-10-CM | POA: Diagnosis not present

## 2016-09-11 DIAGNOSIS — G08 Intracranial and intraspinal phlebitis and thrombophlebitis: Secondary | ICD-10-CM

## 2016-09-11 DIAGNOSIS — L89899 Pressure ulcer of other site, unspecified stage: Secondary | ICD-10-CM | POA: Diagnosis present

## 2016-09-11 LAB — PREPARE FRESH FROZEN PLASMA
UNIT DIVISION: 0
Unit division: 0

## 2016-09-11 LAB — COMPREHENSIVE METABOLIC PANEL
ALBUMIN: 3 g/dL — AB (ref 3.5–5.0)
ALT: 92 U/L — ABNORMAL HIGH (ref 14–54)
ANION GAP: 10 (ref 5–15)
AST: 372 U/L — AB (ref 15–41)
Alkaline Phosphatase: 122 U/L (ref 38–126)
BILIRUBIN TOTAL: 0.4 mg/dL (ref 0.3–1.2)
BUN: 10 mg/dL (ref 6–20)
CO2: 21 mmol/L — AB (ref 22–32)
Calcium: 7.8 mg/dL — ABNORMAL LOW (ref 8.9–10.3)
Chloride: 103 mmol/L (ref 101–111)
Creatinine, Ser: 1.03 mg/dL — ABNORMAL HIGH (ref 0.44–1.00)
GFR calc Af Amer: >60 mL/min (ref >60)
GFR calc non Af Amer: >60 mL/min (ref >60)
GLUCOSE: 174 mg/dL — AB (ref 65–99)
POTASSIUM: 4.4 mmol/L (ref 3.5–5.1)
SODIUM: 134 mmol/L — AB (ref 135–145)
TOTAL PROTEIN: 5.5 g/dL — AB (ref 6.5–8.1)

## 2016-09-11 LAB — URINALYSIS, ROUTINE W REFLEX MICROSCOPIC
BILIRUBIN URINE: NEGATIVE
BILIRUBIN URINE: NEGATIVE
GLUCOSE, UA: NEGATIVE mg/dL
GLUCOSE, UA: 50 mg/dL — AB
HGB URINE DIPSTICK: NEGATIVE
Ketones, ur: NEGATIVE mg/dL
Ketones, ur: NEGATIVE mg/dL
NITRITE: NEGATIVE
Nitrite: NEGATIVE
PH: 5.5 (ref 5.0–8.0)
PH: 5 (ref 5.0–8.0)
Protein, ur: NEGATIVE mg/dL
Protein, ur: 30 mg/dL — AB
SPECIFIC GRAVITY, URINE: 1.031 — AB (ref 1.005–1.030)

## 2016-09-11 LAB — I-STAT ARTERIAL BLOOD GAS, ED
Acid-base deficit: 16 mmol/L — ABNORMAL HIGH (ref 0.0–2.0)
BICARBONATE: 12.3 mmol/L — AB (ref 20.0–28.0)
O2 Saturation: 99 %
PCO2 ART: 36 mmHg (ref 32.0–48.0)
PO2 ART: 156 mmHg — AB (ref 83.0–108.0)
TCO2: 13 mmol/L (ref 0–100)
pH, Arterial: 7.139 — CL (ref 7.350–7.450)

## 2016-09-11 LAB — SALICYLATE LEVEL: Salicylate Lvl: <7 mg/dL (ref 2.8–30.0)

## 2016-09-11 LAB — RAPID URINE DRUG SCREEN, HOSP PERFORMED
AMPHETAMINES: NOT DETECTED
BARBITURATES: NOT DETECTED
Benzodiazepines: POSITIVE — AB
Cocaine: NOT DETECTED
Opiates: NOT DETECTED
TETRAHYDROCANNABINOL: NOT DETECTED

## 2016-09-11 LAB — ACETAMINOPHEN LEVEL: Acetaminophen (Tylenol), Serum: <10 ug/mL — ABNORMAL LOW (ref 10–30)

## 2016-09-11 LAB — CBC
HCT: 38.2 % (ref 36.0–46.0)
HEMATOCRIT: 27.7 % — AB (ref 36.0–46.0)
HEMOGLOBIN: 8.6 g/dL — AB (ref 12.0–15.0)
HEMOGLOBIN: 12.3 g/dL (ref 12.0–15.0)
MCH: 30 pg (ref 26.0–34.0)
MCH: 29.5 pg (ref 26.0–34.0)
MCHC: 31 g/dL (ref 30.0–36.0)
MCHC: 32.2 g/dL (ref 30.0–36.0)
MCV: 96.5 fL (ref 78.0–100.0)
MCV: 91.6 fL (ref 78.0–100.0)
Platelets: 285 10*3/uL (ref 150–400)
Platelets: 255 10*3/uL (ref 150–400)
RBC: 2.87 MIL/uL — ABNORMAL LOW (ref 3.87–5.11)
RBC: 4.17 MIL/uL (ref 3.87–5.11)
RDW: 20.4 % — AB (ref 11.5–15.5)
RDW: 18 % — ABNORMAL HIGH (ref 11.5–15.5)
WBC: 4.5 10*3/uL (ref 4.0–10.5)
WBC: 15.9 10*3/uL — ABNORMAL HIGH (ref 4.0–10.5)

## 2016-09-11 LAB — POC URINE PREG, ED: PREG TEST UR: NEGATIVE

## 2016-09-11 LAB — I-STAT CHEM 8, ED
BUN: 8 mg/dL (ref 6–20)
CALCIUM ION: 1.04 mmol/L — AB (ref 1.15–1.40)
CHLORIDE: 103 mmol/L (ref 101–111)
Creatinine, Ser: 1.4 mg/dL — ABNORMAL HIGH (ref 0.44–1.00)
Glucose, Bld: 166 mg/dL — ABNORMAL HIGH (ref 65–99)
HEMATOCRIT: 27 % — AB (ref 36.0–46.0)
Hemoglobin: 9.2 g/dL — ABNORMAL LOW (ref 12.0–15.0)
Potassium: 4.2 mmol/L (ref 3.5–5.1)
SODIUM: 139 mmol/L (ref 135–145)
TCO2: 22 mmol/L (ref 0–100)

## 2016-09-11 LAB — URINALYSIS, MICROSCOPIC (REFLEX): RBC / HPF: NONE SEEN RBC/hpf (ref 0–5)

## 2016-09-11 LAB — SAMPLE TO BLOOD BANK

## 2016-09-11 LAB — ETHANOL: Alcohol, Ethyl (B): 256 mg/dL — ABNORMAL HIGH (ref <5)

## 2016-09-11 LAB — PROTIME-INR
INR: 1.27
INR: 1.51
PROTHROMBIN TIME: 16 s — AB (ref 11.4–15.2)
Prothrombin Time: 18.3 seconds — ABNORMAL HIGH (ref 11.4–15.2)

## 2016-09-11 LAB — ABO/RH: ABO/RH(D): O POS

## 2016-09-11 LAB — PREPARE RBC (CROSSMATCH)

## 2016-09-11 MED ORDER — MIDAZOLAM HCL 5 MG/5ML IJ SOLN
INTRAMUSCULAR | Status: AC | PRN
  Administered 2016-09-11 (×2): 2 mg via INTRAVENOUS

## 2016-09-11 MED ORDER — SODIUM CHLORIDE 0.9 % IV BOLUS (SEPSIS)
1000.0000 mL | Freq: Once | INTRAVENOUS | Status: AC
  Administered 2016-09-11: 1000 mL via INTRAVENOUS

## 2016-09-11 MED ORDER — ONDANSETRON HCL 4 MG/2ML IJ SOLN: 4.0000 mg | Freq: Four times a day (QID) | INTRAMUSCULAR | Status: DC | PRN

## 2016-09-11 MED ORDER — ETOMIDATE 2 MG/ML IV SOLN
INTRAVENOUS | Status: AC | PRN
  Administered 2016-09-11: 10 mg via INTRAVENOUS

## 2016-09-11 MED ORDER — PROPOFOL 1000 MG/100ML IV EMUL
INTRAVENOUS | Status: AC
  Administered 2016-09-11: 5 ug/kg/min via INTRAVENOUS
  Filled 2016-09-11: qty 100

## 2016-09-11 MED ORDER — MIDAZOLAM HCL 2 MG/2ML IJ SOLN
INTRAMUSCULAR | Status: AC
  Filled 2016-09-11: qty 2

## 2016-09-11 MED ORDER — FENTANYL BOLUS VIA INFUSION
50.0000 ug | INTRAVENOUS | Status: DC | PRN
  Administered 2016-09-15: 50 ug via INTRAVENOUS
  Filled 2016-09-11: qty 50

## 2016-09-11 MED ORDER — PROPOFOL 1000 MG/100ML IV EMUL
0.0000 ug/kg/min | INTRAVENOUS | Status: DC
  Administered 2016-09-11: 4 ug/kg/min via INTRAVENOUS
  Administered 2016-09-12 (×2): 15 ug/kg/min via INTRAVENOUS
  Administered 2016-09-13: 10 ug/kg/min via INTRAVENOUS
  Administered 2016-09-13: 20 ug/kg/min via INTRAVENOUS
  Administered 2016-09-14 (×2): 15 ug/kg/min via INTRAVENOUS
  Filled 2016-09-11 (×7): qty 100

## 2016-09-11 MED ORDER — NALOXONE HCL 2 MG/2ML IJ SOSY
PREFILLED_SYRINGE | INTRAMUSCULAR | Status: AC | PRN
  Administered 2016-09-11: 2 mg via INTRAVENOUS

## 2016-09-11 MED ORDER — FENTANYL CITRATE (PF) 100 MCG/2ML IJ SOLN
50.0000 ug | Freq: Once | INTRAMUSCULAR | Status: AC
  Administered 2016-09-11: 50 ug via INTRAVENOUS
  Filled 2016-09-11: qty 2

## 2016-09-11 MED ORDER — ORAL CARE MOUTH RINSE
15.0000 mL | Freq: Four times a day (QID) | OROMUCOSAL | Status: DC
  Administered 2016-09-12 (×2): 15 mL via OROMUCOSAL

## 2016-09-11 MED ORDER — FENTANYL CITRATE (PF) 100 MCG/2ML IJ SOLN: 100.0000 ug | INTRAMUSCULAR | Status: DC | PRN

## 2016-09-11 MED ORDER — PROPOFOL 1000 MG/100ML IV EMUL: 0.0000 ug/kg/min | INTRAVENOUS | Status: DC

## 2016-09-11 MED ORDER — MIDAZOLAM HCL 2 MG/2ML IJ SOLN: 2.0000 mg | INTRAMUSCULAR | Status: DC | PRN

## 2016-09-11 MED ORDER — EPINEPHRINE PF 1 MG/10ML IJ SOSY
PREFILLED_SYRINGE | INTRAMUSCULAR | Status: AC | PRN
  Administered 2016-09-11: 1 via INTRAVENOUS

## 2016-09-11 MED ORDER — ONDANSETRON HCL 4 MG PO TABS: 4.0000 mg | ORAL_TABLET | Freq: Four times a day (QID) | ORAL | Status: DC | PRN

## 2016-09-11 MED ORDER — IOPAMIDOL (ISOVUE-300) INJECTION 61%
INTRAVENOUS | Status: AC
  Administered 2016-09-11: 100 mL
  Filled 2016-09-11: qty 100

## 2016-09-11 MED ORDER — NOREPINEPHRINE BITARTRATE 1 MG/ML IV SOLN
0.0000 ug/min | INTRAVENOUS | Status: DC
  Administered 2016-09-11: 2 ug/min via INTRAVENOUS
  Administered 2016-09-12 (×2): 35 ug/min via INTRAVENOUS
  Administered 2016-09-12 (×2): 38 ug/min via INTRAVENOUS
  Filled 2016-09-11 (×9): qty 4

## 2016-09-11 MED ORDER — FENTANYL 2500MCG IN NS 250ML (10MCG/ML) PREMIX INFUSION
25.0000 ug/h | INTRAVENOUS | Status: DC
  Administered 2016-09-11: 50 ug/h via INTRAVENOUS
  Administered 2016-09-12 – 2016-09-16 (×5): 100 ug/h via INTRAVENOUS
  Administered 2016-09-17: 350 ug/h via INTRAVENOUS
  Administered 2016-09-17: 100 ug/h via INTRAVENOUS
  Administered 2016-09-18: 350 ug/h via INTRAVENOUS
  Filled 2016-09-11 (×10): qty 250

## 2016-09-11 MED ORDER — NALOXONE HCL 2 MG/2ML IJ SOSY
PREFILLED_SYRINGE | INTRAMUSCULAR | Status: AC
  Filled 2016-09-11: qty 2

## 2016-09-11 MED ORDER — SUCCINYLCHOLINE CHLORIDE 20 MG/ML IJ SOLN
INTRAMUSCULAR | Status: AC | PRN
  Administered 2016-09-11: 120 mg via INTRAVENOUS

## 2016-09-11 MED ORDER — PROPOFOL 1000 MG/100ML IV EMUL
0.0000 ug/kg/min | INTRAVENOUS | Status: DC
  Administered 2016-09-11: 5 ug/kg/min via INTRAVENOUS

## 2016-09-11 MED ORDER — BISACODYL 10 MG RE SUPP
10.0000 mg | Freq: Every day | RECTAL | Status: DC | PRN
  Administered 2016-10-05: 10 mg via RECTAL
  Filled 2016-09-11: qty 1

## 2016-09-11 MED ORDER — DOCUSATE SODIUM 50 MG/5ML PO LIQD
100.0000 mg | Freq: Two times a day (BID) | ORAL | Status: DC | PRN
  Administered 2016-09-18 – 2016-09-19 (×2): 100 mg
  Filled 2016-09-11 (×3): qty 10

## 2016-09-11 MED ORDER — SODIUM CHLORIDE 0.9 % IV SOLN
INTRAVENOUS | Status: DC
  Administered 2016-09-11 – 2016-09-14 (×3): via INTRAVENOUS

## 2016-09-11 MED ORDER — NOREPINEPHRINE BITARTRATE 1 MG/ML IV SOLN
INTRAVENOUS | Status: AC
  Filled 2016-09-11: qty 4

## 2016-09-11 MED ORDER — CHLORHEXIDINE GLUCONATE 0.12% ORAL RINSE (MEDLINE KIT)
15.0000 mL | Freq: Two times a day (BID) | OROMUCOSAL | Status: DC
  Administered 2016-09-11: 15 mL via OROMUCOSAL

## 2016-09-11 NOTE — ED Notes (Signed)
#  1 and # 2 unit emergency blood infused #3 and #4 unit emergency blood initiated.

## 2016-09-11 NOTE — Progress Notes (Signed)
Orthopedic Tech Progress Note Patient Details:  SEELA SCHILZ 04/26/1966 [id number]  Patient ID: Phillis Haggis, female   DOB: 05-22-1966, 50 y.o.   MRN: [id number]  Ortho Tech Present at Trauma. Ortho Tech not needed at this time.    Kristopher Oppenheim 09/11/2016, 9:13 PM

## 2016-09-11 NOTE — ED Notes (Signed)
Pt in CT.

## 2016-09-11 NOTE — ED Provider Notes (Signed)
Procedure Name: Intubation Date/Time: 09/11/2016 10:07 PM Performed by: Duffy Bruce Pre-anesthesia Checklist: Emergency Drugs available, Suction available, Patient being monitored and Timeout performed Oxygen Delivery Method: Ambu bag Preoxygenation: Pre-oxygenation with 100% oxygen Intubation Type: Rapid sequence Ventilation: Mask ventilation without difficulty Laryngoscope Size: Glidescope and 4 Grade View: Grade I Tube size: 7.5 mm Number of attempts: 1 Airway Equipment and Method: Video-laryngoscopy Placement Confirmation: ETT inserted through vocal cords under direct vision Secured at: 21 cm Tube secured with: Tape Dental Injury: Teeth and Oropharynx as per pre-operative assessment  Difficulty Due To: Difficult Airway- due to cervical collar Future Recommendations: Recommend- induction with short-acting agent, and alternative techniques readily available Comments: Large volume blood in posterior pharynx. Limited jaw mobility. Strict in line stabilization maintained.     OG placement Date/Time: 09/11/2016 10:10 PM Performed by: Duffy Bruce Authorized by: Duffy Bruce  Consent: The procedure was performed in an emergent situation. Local anesthesia used: no  Anesthesia: Local anesthesia used: no  Sedation: Patient sedated: yes Patient tolerance: Patient tolerated the procedure well with no immediate complications        Duffy Bruce, MD 09/11/16 2211

## 2016-09-11 NOTE — Progress Notes (Signed)
RT transported patient from ED to 3M without any complications.  

## 2016-09-11 NOTE — Progress Notes (Addendum)
CT head and Cspine reviewed. There is a significant non-displaced fracture through the right mastoid which crosses the level of the right transvere/sigmoid junction. There is an underlying subdural hematoma although it is unclear to me whether this may be extra-axial, measuring approximately 2cm. There is mass effect upon the right occipital lobe, minimal MLS. CT Cspine does not demonstrate any acute fracture or subluxation. There is also a fracture through the anterior skull base/sphenoid bone and the anterior wall of the sella with fluid within the sphenoid and ethmoid sinuses.  Although the hematoma is sizeable, because it is posterior there is minimal MLS and no radiagraphic evidence of brainstem compression. Also, the fracture does run across the transvere/sigmoid sinus which raises the risk of significant possibly life threatening bleeding during any attempted surgery. With her exam, I would therefore not treat the hematoma surgically. Will cont to monitor exam.

## 2016-09-11 NOTE — Progress Notes (Signed)
RT transported patient to CT and back without any complications. RT increased RR to 30 per ABG results. RT also decreased FIO2 to 70%. RT will continue to monitor.
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
## 2016-09-11 NOTE — ED Notes (Signed)
Pt has blood running, unable to redraw labs

## 2016-09-11 NOTE — ED Notes (Addendum)
FFP from [facility] started per Dr. Johnny Bridge

## 2016-09-11 NOTE — ED Notes (Signed)
FFP finished

## 2016-09-11 NOTE — ED Notes (Addendum)
1 gram calcium chloride administered via central line

## 2016-09-11 NOTE — ED Notes (Addendum)
2nd unit emergency release blood O neg initiated via central line on pressure bag.

## 2016-09-11 NOTE — ED Notes (Addendum)
50 mEq sodium bicarb administered via central line.
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
## 2016-09-11 NOTE — ED Notes (Signed)
Pt able to squeeze both hands stronger on the right side

## 2016-09-11 NOTE — ED Notes (Signed)
Pt placed in no violent mitts to keep pt from pulling at tube

## 2016-09-11 NOTE — ED Notes (Signed)
4th unit of blood stopped. That was started in route to facility

## 2016-09-11 NOTE — ED Notes (Signed)
In route with [facility] 3rd unit of blood finished and 4th started. Pt became more alert in route with [facility] responding to voice and name
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
## 2016-09-12 ENCOUNTER — Inpatient Hospital Stay (HOSPITAL_COMMUNITY): Payer: Medicaid Other

## 2016-09-12 ENCOUNTER — Inpatient Hospital Stay (HOSPITAL_COMMUNITY): Payer: Medicaid Other | Admitting: Anesthesiology

## 2016-09-12 LAB — GLUCOSE, CAPILLARY
GLUCOSE-CAPILLARY: 112 mg/dL — AB (ref 65–99)
Glucose-Capillary: 137 mg/dL — ABNORMAL HIGH (ref 65–99)
Glucose-Capillary: 168 mg/dL — ABNORMAL HIGH (ref 65–99)

## 2016-09-12 LAB — BLOOD PRODUCT ORDER (VERBAL) VERIFICATION

## 2016-09-12 LAB — CBC WITH DIFFERENTIAL/PLATELET
BASOS ABS: 0 10*3/uL (ref 0.0–0.1)
Basophils Relative: 0 %
EOS ABS: 0 10*3/uL (ref 0.0–0.7)
Eosinophils Relative: 0 %
HCT: 32.6 % — ABNORMAL LOW (ref 36.0–46.0)
HEMOGLOBIN: 10.6 g/dL — AB (ref 12.0–15.0)
LYMPHS PCT: 3 %
Lymphs Abs: 0.3 10*3/uL — ABNORMAL LOW (ref 0.7–4.0)
MCH: 29.1 pg (ref 26.0–34.0)
MCHC: 32.5 g/dL (ref 30.0–36.0)
MCV: 89.6 fL (ref 78.0–100.0)
MONOS PCT: 4 %
Monocytes Absolute: 0.4 10*3/uL (ref 0.1–1.0)
NEUTROS PCT: 93 %
Neutro Abs: 9.6 10*3/uL — ABNORMAL HIGH (ref 1.7–7.7)
Platelets: 131 10*3/uL — ABNORMAL LOW (ref 150–400)
RBC: 3.64 MIL/uL — AB (ref 3.87–5.11)
RDW: 19.1 % — ABNORMAL HIGH (ref 11.5–15.5)
WBC MORPHOLOGY: INCREASED
WBC: 10.3 10*3/uL (ref 4.0–10.5)

## 2016-09-12 LAB — TYPE AND SCREEN
BLOOD PRODUCT EXPIRATION DATE: 201712082359
Blood Product Expiration Date: 201712082359
Blood Product Expiration Date: 201801022359
Blood Product Expiration Date: 201801022359
Blood Product Expiration Date: 201801062359
ISSUE DATE / TIME: 201712051920
ISSUE DATE / TIME: 201712051920
ISSUE DATE / TIME: 201712051920
ISSUE DATE / TIME: 201712051920
ISSUE DATE / TIME: 201712051920
UNIT TYPE AND RH: 9500
Unit Type and Rh: 9500
Unit Type and Rh: 9500
Unit Type and Rh: 9500
Unit Type and Rh: 9500

## 2016-09-12 LAB — COMPREHENSIVE METABOLIC PANEL
ALT: 161 U/L — AB (ref 14–54)
AST: 821 U/L — ABNORMAL HIGH (ref 15–41)
Albumin: 3 g/dL — ABNORMAL LOW (ref 3.5–5.0)
Alkaline Phosphatase: 173 U/L — ABNORMAL HIGH (ref 38–126)
Anion gap: 17 — ABNORMAL HIGH (ref 5–15)
BUN: 11 mg/dL (ref 6–20)
CHLORIDE: 109 mmol/L (ref 101–111)
CO2: 14 mmol/L — AB (ref 22–32)
CREATININE: 1.38 mg/dL — AB (ref 0.44–1.00)
Calcium: 7.6 mg/dL — ABNORMAL LOW (ref 8.9–10.3)
GFR calc non Af Amer: 44 mL/min — ABNORMAL LOW (ref >60)
GFR, EST AFRICAN AMERICAN: 51 mL/min — AB (ref >60)
Glucose, Bld: 116 mg/dL — ABNORMAL HIGH (ref 65–99)
Potassium: 3.6 mmol/L (ref 3.5–5.1)
SODIUM: 140 mmol/L (ref 135–145)
Total Bilirubin: 0.6 mg/dL (ref 0.3–1.2)
Total Protein: 5.7 g/dL — ABNORMAL LOW (ref 6.5–8.1)

## 2016-09-12 LAB — PHOSPHORUS
Phosphorus: 3.7 mg/dL (ref 2.5–4.6)
Phosphorus: 4.1 mg/dL (ref 2.5–4.6)

## 2016-09-12 LAB — ETHANOL: ALCOHOL ETHYL (B): 91 mg/dL — AB (ref <5)

## 2016-09-12 LAB — PREPARE FRESH FROZEN PLASMA: UNIT DIVISION: 0

## 2016-09-12 LAB — MAGNESIUM
MAGNESIUM: 1.6 mg/dL — AB (ref 1.7–2.4)
Magnesium: 1.6 mg/dL — ABNORMAL LOW (ref 1.7–2.4)

## 2016-09-12 LAB — TRIGLYCERIDES: Triglycerides: 219 mg/dL — ABNORMAL HIGH (ref <150)

## 2016-09-12 LAB — CDS SEROLOGY: CDS serology specimen: 1

## 2016-09-12 LAB — LACTIC ACID, PLASMA
LACTIC ACID, VENOUS: 3.6 mmol/L — AB (ref 0.5–1.9)
LACTIC ACID, VENOUS: 3.3 mmol/L — AB (ref 0.5–1.9)

## 2016-09-12 MED ORDER — PIVOT 1.5 CAL PO LIQD: 1000.0000 mL | ORAL | Status: DC

## 2016-09-12 MED ORDER — SODIUM CHLORIDE 0.9 % IV SOLN
500.0000 mL | Freq: Once | INTRAVENOUS | Status: AC
  Administered 2016-09-12: 500 mL via INTRAVENOUS

## 2016-09-12 MED ORDER — SODIUM CHLORIDE 0.9 % IV SOLN
1.0000 g | Freq: Once | INTRAVENOUS | Status: AC
  Administered 2016-09-12: 1 g via INTRAVENOUS
  Filled 2016-09-12: qty 10

## 2016-09-12 MED ORDER — SUCCINYLCHOLINE CHLORIDE 20 MG/ML IJ SOLN
INTRAMUSCULAR | Status: DC | PRN
  Administered 2016-09-12: 60 mg via INTRAVENOUS

## 2016-09-12 MED ORDER — CHLORHEXIDINE GLUCONATE 0.12% ORAL RINSE (MEDLINE KIT)
15.0000 mL | Freq: Two times a day (BID) | OROMUCOSAL | Status: DC
  Administered 2016-09-12 – 2016-09-26 (×30): 15 mL via OROMUCOSAL

## 2016-09-12 MED ORDER — PRO-STAT SUGAR FREE PO LIQD: 30.0000 mL | Freq: Two times a day (BID) | ORAL | Status: DC

## 2016-09-12 MED ORDER — ALBUMIN HUMAN 25 % IV SOLN: 12.5000 g | Freq: Once | INTRAVENOUS | Status: AC

## 2016-09-12 MED ORDER — NOREPINEPHRINE BITARTRATE 1 MG/ML IV SOLN
0.0000 ug/min | INTRAVENOUS | Status: DC
  Administered 2016-09-12: 35 ug/min via INTRAVENOUS
  Filled 2016-09-12 (×2): qty 16

## 2016-09-12 MED ORDER — ADULT MULTIVITAMIN LIQUID CH
15.0000 mL | Freq: Every day | ORAL | Status: DC
  Administered 2016-09-12 – 2016-09-21 (×9): 15 mL
  Filled 2016-09-12 (×16): qty 15

## 2016-09-12 MED ORDER — ALBUMIN HUMAN 25 % IV SOLN: 25.0000 g | Freq: Once | INTRAVENOUS | Status: DC

## 2016-09-12 MED ORDER — SODIUM CHLORIDE 0.9 % IV SOLN
Freq: Once | INTRAVENOUS | Status: AC
  Administered 2016-09-12: 08:00:00 via INTRAVENOUS

## 2016-09-12 MED ORDER — LEVETIRACETAM 500 MG/5ML IV SOLN
500.0000 mg | Freq: Two times a day (BID) | INTRAVENOUS | Status: DC
  Administered 2016-09-12 – 2016-09-28 (×34): 500 mg via INTRAVENOUS
  Filled 2016-09-12 (×36): qty 5

## 2016-09-12 MED ORDER — ALBUMIN HUMAN 5 % IV SOLN
25.0000 g | Freq: Once | INTRAVENOUS | Status: AC
  Administered 2016-09-12: 25 g via INTRAVENOUS
  Filled 2016-09-12: qty 500
  Filled 2016-09-12: qty 250

## 2016-09-12 MED ORDER — ACETAMINOPHEN 160 MG/5ML PO SOLN
650.0000 mg | ORAL | Status: DC | PRN
  Administered 2016-09-12 – 2016-09-14 (×5): 650 mg
  Filled 2016-09-12 (×4): qty 20.3

## 2016-09-12 MED ORDER — ALBUMIN HUMAN 5 % IV SOLN
INTRAVENOUS | Status: AC
  Administered 2016-09-12: 12.5 g
  Filled 2016-09-12: qty 250

## 2016-09-12 MED ORDER — PIVOT 1.5 CAL PO LIQD
1000.0000 mL | ORAL | Status: DC
  Administered 2016-09-12: 1000 mL

## 2016-09-12 MED ORDER — ORAL CARE MOUTH RINSE
15.0000 mL | OROMUCOSAL | Status: DC
  Administered 2016-09-12 – 2016-09-27 (×147): 15 mL via OROMUCOSAL

## 2016-09-12 MED ORDER — PRO-STAT SUGAR FREE PO LIQD
60.0000 mL | Freq: Four times a day (QID) | ORAL | Status: DC
  Administered 2016-09-12 – 2016-09-23 (×41): 60 mL
  Filled 2016-09-12 (×39): qty 60
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
## 2016-09-12 NOTE — Progress Notes (Signed)
Follow up - Trauma and Critical Care  Patient Details:    Jacqueline [name] is an 50 y.o. female.  Lines/tubes : Airway 7.5 mm (Active)  Secured at (cm) 21 cm 09/12/2016  3:00 AM  Measured From Lips 09/12/2016  3:00 AM  Secured Location Center 09/12/2016  3:00 AM  Secured By [employer] 09/12/2016  3:00 AM  Tube Holder Repositioned Yes 09/12/2016  3:00 AM  Site Condition Dry 09/12/2016  3:00 AM     CVC Triple Lumen 09/11/16 Right Femoral (Active)  Indication for Insertion or Continuance of Line Vasoactive infusions 09/11/2016 11:15 PM  Site Assessment Clean;Dry;Intact 09/11/2016 11:15 PM  Proximal Lumen Status Infusing 09/11/2016 11:15 PM  Medial Infusing 09/11/2016 11:15 PM  Distal Lumen Status Flushed;Saline locked 09/11/2016 11:15 PM  Dressing Type Transparent;Occlusive 09/11/2016 11:15 PM  Dressing Status Clean;Dry;Intact;Antimicrobial disc in place 09/11/2016 11:15 PM  Line Care Connections checked and tightened 09/11/2016 11:15 PM  Dressing Change Due 09/18/16 09/11/2016 11:15 PM     NG/OG Tube Orogastric Left mouth Xray Measured external length of tube (Active)  Site Assessment Clean;Dry;Intact 09/11/2016 11:15 PM  Ongoing Placement Verification Xray;Auscultation 09/11/2016 11:15 PM  Status Suction-low intermittent 09/11/2016 11:15 PM     Urethral Catheter Brayton Layman RN charge nurse Temperature probe 14 Fr. (Active)  Indication for Insertion or Continuance of Catheter Unstable critical patients (first 24-48 hours) 09/11/2016 11:15 PM  Site Assessment Clean;Intact 09/11/2016 11:15 PM  Catheter Maintenance Bag below level of bladder;Catheter secured;No dependent loops;Seal intact;Drainage bag/tubing not touching floor;Insertion date on drainage bag 09/11/2016 11:15 PM  Collection Container Standard drainage bag 09/11/2016 11:15 PM  Securement Method Securing device (Describe) 09/11/2016 11:15 PM  Urinary Catheter Interventions Unclamped 09/11/2016 11:15 PM  Output (mL) 100 mL 09/12/2016   6:40 AM    Microbiology/Sepsis markers: Results for orders placed or performed during the hospital encounter of 12/07/15  Urine culture     Status: None   Collection Time: 12/07/15 10:48 AM  Result Value Ref Range Status   Specimen Description URINE, CLEAN CATCH  Final   Special Requests NONE  Final   Culture   Final    NO GROWTH 2 DAYS Performed at [facility]    Report Status 12/09/2015 FINAL  Final    Anti-infectives:  Anti-infectives    None      Best Practice/Protocols:  VTE Prophylaxis: Mechanical GI Prophylaxis: Proton Pump Inhibitor Continous Sedation fentanyl and propofol  Consults: Treatment Team:  Consuella Lose, MD    Events:  Subjective:    Overnight Issues: Required pressors all night.  Was agitated but now sedate  Objective:  Vital signs for last 24 hours: Temp:  [96.4 F (35.8 C)-103.1 F (39.5 C)] 101.5 F (38.6 C) (12/06 0700) Pulse Rate:  [80-146] 120 (12/06 0700) Resp:  [14-42] 30 (12/06 0700) BP: (46-117)/(27-82) 89/56 (12/06 0700) SpO2:  [82 %-100 %] 100 % (12/06 0700) FiO2 (%):  [70 %-100 %] 70 % (12/06 0300) Weight:  [93 kg (205 lb)-95.5 kg (210 lb 8.6 oz)] 95.5 kg (210 lb 8.6 oz) (12/05 2315)  Hemodynamic parameters for last 24 hours:    Intake/Output from previous day: 12/05 0701 - 12/06 0700 In: 3597.5 [I.V.:2597.5; IV Piggyback:1000] Out: 1075 [Urine:1075]  Intake/Output this shift: No intake/output data recorded.  Vent settings for last 24 hours: Vent Mode: PRVC FiO2 (%):  [70 %-100 %] 70 % Set Rate:  [25 bmp-30 bmp] 30 bmp Vt Set:  [450 mL-530 mL] 530 mL PEEP:  [  0 cmH20-5 cmH20] 5 cmH20 Pressure Support:  [5 cmH20] 5 cmH20 Plateau Pressure:  [16 cmH20-25 cmH20] 20 cmH20  Physical Exam:  General: no respiratory distress Neuro: nonfocal exam and RASS -2 HEENT/Neck: no JVD, ETT WNL  and Pupils are midrange and sluggishly reactive Resp: clear to auscultation bilaterally and CXr pending.  Oxygen  saturations are 99% on FIO2 70%.  ABG is pending. CVS: regular rate and rhythm, S1, S2 normal, no murmur, click, rub or gallop and No tachycardia GI: soft, nontender, BS WNL, no r/g and Has pelvic binder in place for  minimal sacral fracture.  Ortho not called.  Released this AM.   Extremities: no edema, no erythema, pulses WNL  Results for orders placed or performed during the hospital encounter of 09/11/16 (from the past 24 hour(s))  I-stat chem 8, ed     Status: Abnormal   Collection Time: 09/11/16  6:50 PM  Result Value Ref Range   Sodium 139 135 - 145 mmol/L   Potassium 4.2 3.5 - 5.1 mmol/L   Chloride 103 101 - 111 mmol/L   BUN 8 6 - 20 mg/dL   Creatinine, Ser 1.40 (H) 0.44 - 1.00 mg/dL   Glucose, Bld 166 (H) 65 - 99 mg/dL   Calcium, Ion 1.04 (L) 1.15 - 1.40 mmol/L   TCO2 22 0 - 100 mmol/L   Hemoglobin 9.2 (L) 12.0 - 15.0 g/dL   HCT 27.0 (L) 36.0 - 46.0 %  CDS serology     Status: None   Collection Time: 09/11/16  7:09 PM  Result Value Ref Range   CDS serology specimen 1 LIGHT GREEN AND 1 DARK GREEN   Comprehensive metabolic panel     Status: Abnormal   Collection Time: 09/11/16  7:09 PM  Result Value Ref Range   Sodium 134 (L) 135 - 145 mmol/L   Potassium 4.4 3.5 - 5.1 mmol/L   Chloride 103 101 - 111 mmol/L   CO2 21 (L) 22 - 32 mmol/L   Glucose, Bld 174 (H) 65 - 99 mg/dL   BUN 10 6 - 20 mg/dL   Creatinine, Ser 1.03 (H) 0.44 - 1.00 mg/dL   Calcium 7.8 (L) 8.9 - 10.3 mg/dL   Total Protein 5.5 (L) 6.5 - 8.1 g/dL   Albumin 3.0 (L) 3.5 - 5.0 g/dL   AST 372 (H) 15 - 41 U/L   ALT 92 (H) 14 - 54 U/L   Alkaline Phosphatase 122 38 - 126 U/L   Total Bilirubin 0.4 0.3 - 1.2 mg/dL   GFR calc non Af Amer >60 >60 mL/min   GFR calc Af Amer >60 >60 mL/min   Anion gap 10 5 - 15  CBC     Status: Abnormal   Collection Time: 09/11/16  7:09 PM  Result Value Ref Range   WBC 4.5 4.0 - 10.5 K/uL   RBC 2.87 (L) 3.87 - 5.11 MIL/uL   Hemoglobin 8.6 (L) 12.0 - 15.0 g/dL   HCT 27.7 (L) 36.0  - 46.0 %   MCV 96.5 78.0 - 100.0 fL   MCH 30.0 26.0 - 34.0 pg   MCHC 31.0 30.0 - 36.0 g/dL   RDW 20.4 (H) 11.5 - 15.5 %   Platelets 285 150 - 400 K/uL  Urinalysis, Routine w reflex microscopic     Status: Abnormal   Collection Time: 09/11/16  7:09 PM  Result Value Ref Range   Color, Urine STRAW (A) YELLOW   APPearance CLEAR CLEAR   Specific Gravity,  Urine <1.005 (L) 1.005 - 1.030   pH 5.5 5.0 - 8.0   Glucose, UA NEGATIVE NEGATIVE mg/dL   Hgb urine dipstick NEGATIVE NEGATIVE   Bilirubin Urine NEGATIVE NEGATIVE   Ketones, ur NEGATIVE NEGATIVE mg/dL   Protein, ur NEGATIVE NEGATIVE mg/dL   Nitrite NEGATIVE NEGATIVE   Leukocytes, UA SMALL (A) NEGATIVE  Protime-INR     Status: Abnormal   Collection Time: 09/11/16  7:09 PM  Result Value Ref Range   Prothrombin Time 16.0 (H) 11.4 - 15.2 seconds   INR 1.27   Urinalysis, Microscopic (reflex)     Status: Abnormal   Collection Time: 09/11/16  7:09 PM  Result Value Ref Range   RBC / HPF NONE SEEN 0 - 5 RBC/hpf   WBC, UA 0-5 0 - 5 WBC/hpf   Bacteria, UA MANY (A) NONE SEEN   Squamous Epithelial / LPF 0-5 (A) NONE SEEN  Rapid urine drug screen (hospital performed)     Status: Abnormal   Collection Time: 09/11/16  7:10 PM  Result Value Ref Range   Opiates NONE DETECTED NONE DETECTED   Cocaine NONE DETECTED NONE DETECTED   Benzodiazepines POSITIVE (A) NONE DETECTED   Amphetamines NONE DETECTED NONE DETECTED   Tetrahydrocannabinol NONE DETECTED NONE DETECTED   Barbiturates NONE DETECTED NONE DETECTED  Ethanol     Status: Abnormal   Collection Time: 09/11/16  7:25 PM  Result Value Ref Range   Alcohol, Ethyl (B) 256 (H) <5 mg/dL  Acetaminophen level     Status: Abnormal   Collection Time: 09/11/16  7:25 PM  Result Value Ref Range   Acetaminophen (Tylenol), Serum <10 (L) 10 - 30 ug/mL  Salicylate level     Status: None   Collection Time: 09/11/16  7:25 PM  Result Value Ref Range   Salicylate Lvl [id number] 2.8 - 30.0 mg/dL  Sample to Blood  Bank     Status: None   Collection Time: 09/11/16  7:35 PM  Result Value Ref Range   Blood Bank Specimen SAMPLE AVAILABLE FOR TESTING    Sample Expiration 09/14/2016   Type and screen     Status: None   Collection Time: 09/11/16  7:35 PM  Result Value Ref Range   ISSUE DATE / TIME [id number]    Blood Product Unit Number [id number]    Unit Type and Rh 9500    Blood Product Expiration Date [id number]    ISSUE DATE / TIME P8158622    Blood Product Unit Number [id number]    Unit Type and Rh 9500    Blood Product Expiration Date [id number]    ISSUE DATE / TIME P8158622    Blood Product Unit Number [id number]    Unit Type and Rh 9500    Blood Product Expiration Date [id number]    ISSUE DATE / TIME P8158622    Blood Product Unit Number [id number]    Unit Type and Rh 9500    Blood Product Expiration Date [id number]    ISSUE DATE / TIME P8158622    Blood Product Unit Number [id number]    Unit Type and Rh 9500    Blood Product Expiration Date D2405655   Prepare RBC (crossmatch)     Status: None   Collection Time: 09/11/16  7:35 PM  Result Value Ref Range   Order Confirmation ORDER PROCESSED BY BLOOD BANK   Prepare fresh frozen plasma     Status: None   Collection Time: 09/11/16  7:35 PM  Result Value Ref Range  ISSUE DATE / TIME [id number]:137275    Blood Product Unit Number [id number]    Unit Type and Rh 8400    Blood Product Expiration Date KY:3777404   POC Urine Pregnancy, ED     Status: None   Collection Time: 09/11/16  7:45 PM  Result Value Ref Range   Preg Test, Ur NEGATIVE NEGATIVE  Prepare fresh frozen plasma     Status: None   Collection Time: 09/11/16  8:56 PM  Result Value Ref Range   Unit Number [id number]    Blood Component Type LIQ PLASMA    Unit division 00    Status of Unit REL FROM [facility]    Unit tag comment VERBAL ORDERS PER DR KNAPP    Transfusion Status OK TO TRANSFUSE    Unit Number [id number]    Blood  Component Type LIQ PLASMA    Unit division 00    Status of Unit REL FROM [facility]    Unit tag comment VERBAL ORDERS PER DR KNAPP    Transfusion Status OK TO TRANSFUSE   Comprehensive metabolic panel     Status: Abnormal   Collection Time: 09/11/16  9:08 PM  Result Value Ref Range   Sodium 140 135 - 145 mmol/L   Potassium 3.6 3.5 - 5.1 mmol/L   Chloride 109 101 - 111 mmol/L   CO2 14 (L) 22 - 32 mmol/L   Glucose, Bld 116 (H) 65 - 99 mg/dL   BUN 11 6 - 20 mg/dL   Creatinine, Ser 1.38 (H) 0.44 - 1.00 mg/dL   Calcium 7.6 (L) 8.9 - 10.3 mg/dL   Total Protein 5.7 (L) 6.5 - 8.1 g/dL   Albumin 3.0 (L) 3.5 - 5.0 g/dL   AST 821 (H) 15 - 41 U/L   ALT 161 (H) 14 - 54 U/L   Alkaline Phosphatase 173 (H) 38 - 126 U/L   Total Bilirubin 0.6 0.3 - 1.2 mg/dL   GFR calc non Af Amer 44 (L) >60 mL/min   GFR calc Af Amer 51 (L) >60 mL/min   Anion gap 17 (H) 5 - 15  CBC     Status: Abnormal   Collection Time: 09/11/16  9:08 PM  Result Value Ref Range   WBC 15.9 (H) 4.0 - 10.5 K/uL   RBC 4.17 3.87 - 5.11 MIL/uL   Hemoglobin 12.3 12.0 - 15.0 g/dL   HCT 38.2 36.0 - 46.0 %   MCV 91.6 78.0 - 100.0 fL   MCH 29.5 26.0 - 34.0 pg   MCHC 32.2 30.0 - 36.0 g/dL   RDW 18.0 (H) 11.5 - 15.5 %   Platelets 255 150 - 400 K/uL  Ethanol     Status: Abnormal   Collection Time: 09/11/16  9:08 PM  Result Value Ref Range   Alcohol, Ethyl (B) 91 (H) <5 mg/dL  Protime-INR     Status: Abnormal   Collection Time: 09/11/16  9:08 PM  Result Value Ref Range   Prothrombin Time 18.3 (H) 11.4 - 15.2 seconds   INR 1.51   Triglycerides     Status: Abnormal   Collection Time: 09/11/16  9:08 PM  Result Value Ref Range   Triglycerides 219 (H) <150 mg/dL  I-Stat arterial blood gas, ED     Status: Abnormal   Collection Time: 09/11/16  9:55 PM  Result Value Ref Range   pH, Arterial 7.139 (LL) 7.350 - 7.450   pCO2 arterial 36.0 32.0 - 48.0 mmHg   pO2, Arterial 156.0 (H) 83.0 -  108.0 mmHg   Bicarbonate 12.3 (L) 20.0 - 28.0 mmol/L    TCO2 13 0 - 100 mmol/L   O2 Saturation 99.0 %   Acid-base deficit 16.0 (H) 0.0 - 2.0 mmol/L   Patient temperature 36.4 C    Collection site RADIAL, ALLEN'S TEST ACCEPTABLE    Drawn by RT    Sample type ARTERIAL    Comment NOTIFIED PHYSICIAN   Type and screen     Status: None   Collection Time: 09/11/16 10:04 PM  Result Value Ref Range   ISSUE DATE / TIME [id number]:[id number]    Blood Product Unit Number [id number]    Unit Type and Rh 9500    Blood Product Expiration Date [id number]    ISSUE DATE / TIME [id number]    Blood Product Unit Number [id number]    PRODUCT CODE [id number]    Unit Type and Rh 9500    Blood Product Expiration Date [id number]   ABO/Rh     Status: None   Collection Time: 09/11/16 10:04 PM  Result Value Ref Range   ABO/RH(D) O POS   Urinalysis, Routine w reflex microscopic     Status: Abnormal   Collection Time: 09/11/16 11:24 PM  Result Value Ref Range   Color, Urine YELLOW YELLOW   APPearance CLOUDY (A) CLEAR   Specific Gravity, Urine 1.031 (H) 1.005 - 1.030   pH 5.0 5.0 - 8.0   Glucose, UA 50 (A) NEGATIVE mg/dL   Hgb urine dipstick LARGE (A) NEGATIVE   Bilirubin Urine NEGATIVE NEGATIVE   Ketones, ur NEGATIVE NEGATIVE mg/dL   Protein, ur 30 (A) NEGATIVE mg/dL   Nitrite NEGATIVE NEGATIVE   Leukocytes, UA LARGE (A) NEGATIVE   RBC / HPF 0-5 0 - 5 RBC/hpf   WBC, UA TOO NUMEROUS TO COUNT 0 - 5 WBC/hpf   Bacteria, UA RARE (A) NONE SEEN   Squamous Epithelial / LPF 0-5 (A) NONE SEEN   WBC Clumps PRESENT    Mucous PRESENT   Prepare fresh frozen plasma     Status: None (Preliminary result)   Collection Time: 09/12/16  7:46 AM  Result Value Ref Range   ISSUE DATE / TIME NY:1313968    Blood Product Unit Number [id number]    PRODUCT CODE [id number]    Unit Type and Rh 9500    Blood Product Expiration Date T9821643    ISSUE DATE / TIME T656887    Blood Product Unit Number [id number]    PRODUCT CODE [id number]    Unit Type and Rh 5100     Blood Product Expiration Date T9821643    Blood Product Unit Number [id number]    Unit Type and Rh [id number]    Blood Product Expiration Date P2200757    Blood Product Unit Number [id number]    Unit Type and Rh [id number]    Blood Product Expiration Date AE:3982582      Assessment/Plan:   NEURO  Altered Mental Status:  coma and sedation   Plan: Induced coma.  Not reacting now at all  PULM  Atelectasis/collapse (focal and had LLL atelectasis on CT.  CXR this morning in pending)   Plan: Check the CXR and possible bronchoscopy later  CARDIO  No significant issues currently   Plan: CPM  RENAL  Oliguria (probably hypovolemia and low effective intravascular volume) Actue Renal Failure (due to hypovolemia/decreased circulating volume)   Plan: Increased fluids, bolus with albumin, FFP and saline.  Get in central line to measure CVP.  GI  No apparent  injuries.   Plan: Will probably start tube feeds later today.  ID  No known infectious sources   Plan: No changes for now  HEME  Anemia acute blood loss anemia) Coagulopathy (dilutional)   Plan: Will give some FFP this morning  ENDO No known issues   Plan: CPM  Global Issues  Patient needs better resuscitation.  Needs central line.  Arterial line.  CXR.  Spoke with neurosurgeon.    LOS: 1 day   Additional comments:I reviewed the patient's new clinical lab test results. cbc/bmet and I reviewed the patients new imaging test results. cxr  Critical Care Total Time*: 30 Minutes  Ronn Smolinsky 09/12/2016  *Care during the described time interval was provided by me and/or other providers on the critical care team.  I have reviewed this patient's available data, including medical history, events of note, physical examination and test results as part of my evaluation.
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
## 2016-09-12 NOTE — Progress Notes (Signed)
RT advanced ETT 2 cm per MD order. RT noticed leak and check ETT pilot ballon and discovered a leak. RT reported issue to MD, MD called anesthesia. Anesthesia removed damaged ETT and replaced with new 7.5 subglottic ETT secured @ 23 at lip with hollister tube holder. Pt tol well. No complications at any point. Will cont to monitor

## 2016-09-12 NOTE — Procedures (Signed)
Arterial Catheter Insertion Procedure Note OK GANO [id number] May 20, 1966  Procedure: Insertion of Arterial Catheter  Indications: Blood pressure monitoring and Frequent blood sampling  Procedure Details Consent: Unable to obtain consent because of pt intubated and sedated. Time Out: Verified patient identification, verified procedure, site/side was marked, verified correct patient position, special equipment/implants available, medications/allergies/relevent history reviewed, required imaging and test results available.  Performed  Maximum sterile technique was used including antiseptics, cap, gloves, gown, hand hygiene, mask and sheet. Skin prep: Chlorhexidine; local anesthetic administered 20 gauge catheter was inserted into left radial artery using the Seldinger technique.  Evaluation Blood flow good; BP tracing good. Complications: No apparent complications.   Soyla Dryer 09/12/2016

## 2016-09-12 NOTE — ED Notes (Signed)
Reviewed allergies with family. Only allergies on file are the ones that the said

## 2016-09-12 NOTE — Progress Notes (Signed)
Dr. Georgette Dover notified regarding pts increased HR, sustaining 145s. New order to administer Albumin.

## 2016-09-12 NOTE — Progress Notes (Signed)
Pt temp has been steadily rising. Ice packs applied with no effect. Order obtained for Tylenol.
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
## 2016-09-12 NOTE — Procedures (Signed)
Central Venous Catheter Insertion Procedure Note ELLIEANA WAITERS [id number] 01-May-1966  Procedure: Insertion of Central Venous Catheter Indications: Assessment of intravascular volume, Drug and/or fluid administration and Frequent blood sampling  Procedure Details Consent: Risks of procedure as well as the alternatives and risks of each were explained to the (patient/caregiver).  Consent for procedure obtained. Time Out: Verified patient identification, verified procedure, site/side was marked, verified correct patient position, special equipment/implants available, medications/allergies/relevent history reviewed, required imaging and test results available.  Performed  Maximum sterile technique was used including antiseptics, cap, gloves, gown, hand hygiene, mask and sheet. Skin prep: Chlorhexidine; local anesthetic administered A antimicrobial bonded/coated triple lumen catheter was placed in the right subclavian vein using the Seldinger technique.  Evaluation Blood flow good Complications: No apparent complications Patient did tolerate procedure well. Chest X-ray ordered to verify placement.  CXR: pending.  Fraser Din Nolin Grell 09/12/2016, 3:02 PM

## 2016-09-12 NOTE — Progress Notes (Signed)
Dr. Georgette Dover notified that after lying pt flat to change sheets and c-collar, pts R ear started draining lots of blood, CSF, and probable brain matter. Scheduled CT postponed until later in the day. Will continue to monitor. HOB >30.

## 2016-09-13 LAB — BASIC METABOLIC PANEL
Anion gap: 8 (ref 5–15)
BUN: 40 mg/dL — ABNORMAL HIGH (ref 6–20)
CALCIUM: 7.1 mg/dL — AB (ref 8.9–10.3)
CO2: 19 mmol/L — AB (ref 22–32)
CREATININE: 2.23 mg/dL — AB (ref 0.44–1.00)
Chloride: 115 mmol/L — ABNORMAL HIGH (ref 101–111)
GFR, EST AFRICAN AMERICAN: 29 mL/min — AB (ref >60)
GFR, EST NON AFRICAN AMERICAN: 25 mL/min — AB (ref >60)
Glucose, Bld: 117 mg/dL — ABNORMAL HIGH (ref 65–99)
Potassium: 3.5 mmol/L (ref 3.5–5.1)
SODIUM: 142 mmol/L (ref 135–145)

## 2016-09-13 LAB — PREPARE FRESH FROZEN PLASMA
BLOOD PRODUCT EXPIRATION DATE: 201712062359
BLOOD PRODUCT EXPIRATION DATE: 201712092359
Blood Product Expiration Date: 201712062359
Blood Product Expiration Date: 201712092359
ISSUE DATE / TIME: 201712060808
ISSUE DATE / TIME: 201712061400
ISSUE DATE / TIME: 201712061020
ISSUE DATE / TIME: 201712061400
UNIT TYPE AND RH: 5100
Unit Type and Rh: 6200
Unit Type and Rh: 6200
Unit Type and Rh: 9500

## 2016-09-13 LAB — CBC WITH DIFFERENTIAL/PLATELET
BASOS ABS: 0 10*3/uL (ref 0.0–0.1)
Basophils Relative: 0 %
EOS ABS: 0 10*3/uL (ref 0.0–0.7)
Eosinophils Relative: 0 %
HEMATOCRIT: 30.8 % — AB (ref 36.0–46.0)
Hemoglobin: 10 g/dL — ABNORMAL LOW (ref 12.0–15.0)
LYMPHS ABS: 0.4 10*3/uL — AB (ref 0.7–4.0)
Lymphocytes Relative: 6 %
MCH: 29.2 pg (ref 26.0–34.0)
MCHC: 32.5 g/dL (ref 30.0–36.0)
MCV: 90.1 fL (ref 78.0–100.0)
MONO ABS: 0.2 10*3/uL (ref 0.1–1.0)
Monocytes Relative: 3 %
NEUTROS PCT: 91 %
Neutro Abs: 6.4 10*3/uL (ref 1.7–7.7)
PLATELETS: 120 10*3/uL — AB (ref 150–400)
RBC: 3.42 MIL/uL — AB (ref 3.87–5.11)
RDW: 19.6 % — AB (ref 11.5–15.5)
WBC: 7 10*3/uL (ref 4.0–10.5)

## 2016-09-13 LAB — GLUCOSE, CAPILLARY
GLUCOSE-CAPILLARY: 121 mg/dL — AB (ref 65–99)
GLUCOSE-CAPILLARY: 119 mg/dL — AB (ref 65–99)
Glucose-Capillary: 102 mg/dL — ABNORMAL HIGH (ref 65–99)
Glucose-Capillary: 116 mg/dL — ABNORMAL HIGH (ref 65–99)
Glucose-Capillary: 118 mg/dL — ABNORMAL HIGH (ref 65–99)
Glucose-Capillary: 133 mg/dL — ABNORMAL HIGH (ref 65–99)

## 2016-09-13 LAB — BLOOD GAS, ARTERIAL
ACID-BASE DEFICIT: 7.4 mmol/L — AB (ref 0.0–2.0)
BICARBONATE: 17.7 mmol/L — AB (ref 20.0–28.0)
DRAWN BY: 313061
FIO2: 40
MECHVT: 530 mL
O2 Saturation: 95.8 %
PATIENT TEMPERATURE: 98.6
PCO2 ART: 36.4 mmHg (ref 32.0–48.0)
PEEP/CPAP: 8 cmH2O
PH ART: 7.307 — AB (ref 7.350–7.450)
PO2 ART: 104 mmHg (ref 83.0–108.0)
RATE: 30 resp/min

## 2016-09-13 LAB — TYPE AND SCREEN
ABO/RH(D): O POS
ANTIBODY SCREEN: NEGATIVE
UNIT DIVISION: 0
Unit division: 0

## 2016-09-13 LAB — PROTIME-INR
INR: 1.46
Prothrombin Time: 17.8 seconds — ABNORMAL HIGH (ref 11.4–15.2)

## 2016-09-13 LAB — LACTIC ACID, PLASMA: Lactic Acid, Venous: 2 mmol/L (ref 0.5–1.9)

## 2016-09-13 LAB — MAGNESIUM: MAGNESIUM: 1.6 mg/dL — AB (ref 1.7–2.4)

## 2016-09-13 LAB — PHOSPHORUS: Phosphorus: 4.2 mg/dL (ref 2.5–4.6)

## 2016-09-13 MED ORDER — ALBUMIN HUMAN 5 % IV SOLN
INTRAVENOUS | Status: AC
  Filled 2016-09-13: qty 250

## 2016-09-13 MED ORDER — PANTOPRAZOLE SODIUM 40 MG PO PACK
40.0000 mg | PACK | Freq: Every day | ORAL | Status: DC
Start: 2016-09-13 — End: 2016-09-27
  Administered 2016-09-13 – 2016-09-23 (×10): 40 mg
  Filled 2016-09-13 (×10): qty 20

## 2016-09-13 MED ORDER — PIVOT 1.5 CAL PO LIQD
1000.0000 mL | ORAL | Status: DC
  Administered 2016-09-14 – 2016-09-16 (×3): 1000 mL
  Filled 2016-09-13 (×4): qty 1000

## 2016-09-13 MED ORDER — ALBUMIN HUMAN 5 % IV SOLN
25.0000 g | Freq: Once | INTRAVENOUS | Status: AC
  Administered 2016-09-13: 25 g via INTRAVENOUS
  Filled 2016-09-13: qty 500

## 2016-09-13 MED ORDER — SODIUM CHLORIDE 0.9 % IV SOLN
500.0000 mL | Freq: Once | INTRAVENOUS | Status: AC
  Administered 2016-09-13: 500 mL via INTRAVENOUS

## 2016-09-13 MED ORDER — ALBUMIN HUMAN 5 % IV SOLN
25.0000 g | Freq: Once | INTRAVENOUS | Status: AC
  Administered 2016-09-13: 25 g via INTRAVENOUS
  Filled 2016-09-13: qty 250

## 2016-09-13 MED FILL — Medication: Qty: 1 | Status: AC
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
## 2016-09-13 NOTE — Progress Notes (Signed)
Follow up - Trauma and Critical Care  Patient Details:    Jacqueline [name] is an 50 y.o. female.  Lines/tubes : Airway 7.5 mm (Active)  Secured at (cm) 24 cm 09/13/2016  7:27 AM  Measured From Lips 09/13/2016  7:27 AM  Secured Location Left 09/13/2016  7:27 AM  Secured By [employer] 09/13/2016  7:27 AM  Tube Holder Repositioned Yes 09/13/2016  7:27 AM  Cuff Pressure (cm H2O) 28 cm H2O 09/13/2016  7:27 AM  Site Condition Dry 09/13/2016  4:13 AM     CVC Triple Lumen 09/12/16 Right Subclavian (Active)  Indication for Insertion or Continuance of Line Vasoactive infusions 09/12/2016  8:00 PM  Site Assessment Clean;Dry;Intact 09/12/2016  8:00 PM  Proximal Lumen Status Infusing 09/12/2016  8:00 PM  Medial Infusing 09/12/2016  8:00 PM  Distal Lumen Status Infusing 09/12/2016  8:00 PM  Dressing Type Transparent;Occlusive 09/12/2016  8:00 PM  Dressing Status Clean;Dry;Intact;Antimicrobial disc in place 09/12/2016  8:00 PM  Line Care Connections checked and tightened 09/12/2016  8:00 PM  Dressing Change Due 09/19/16 09/12/2016  8:00 PM     Arterial Line 09/12/16 Left Radial (Active)  Site Assessment Clean;Dry;Intact 09/12/2016  8:00 PM  Line Status Pulsatile blood flow 09/12/2016  8:00 PM  Art Line Waveform Appropriate 09/12/2016  8:00 PM  Art Line Interventions Leveled;Zeroed and calibrated 09/12/2016  8:00 PM  Color/Movement/Sensation Capillary refill less than 3 sec 09/12/2016  8:00 PM  Dressing Type Transparent;Occlusive 09/12/2016  8:00 PM  Dressing Status Clean;Dry;Intact 09/12/2016  8:00 PM  Interventions Other (Comment) 09/12/2016 10:00 AM  Dressing Change Due 09/19/16 09/12/2016  8:00 PM     NG/OG Tube Orogastric Left mouth Xray Measured external length of tube (Active)  Site Assessment Clean;Dry;Intact 09/12/2016  8:00 PM  Ongoing Placement Verification Auscultation 09/12/2016  8:00 PM  Status Infusing tube feed 09/12/2016  8:00 PM  Intake (mL) 60 mL 09/12/2016  9:30 PM     Urethral  Catheter Brayton Layman RN charge nurse Temperature probe 14 Fr. (Active)  Indication for Insertion or Continuance of Catheter Unstable critical patients (first 24-48 hours) 09/12/2016  8:00 PM  Site Assessment Clean;Intact 09/12/2016  8:00 PM  Catheter Maintenance Bag below level of bladder;Catheter secured;Drainage bag/tubing not touching floor;Insertion date on drainage bag;No dependent loops;Seal intact 09/12/2016  8:00 PM  Collection Container Standard drainage bag 09/12/2016  8:00 PM  Securement Method Securing device (Describe) 09/12/2016  8:00 PM  Urinary Catheter Interventions Unclamped 09/12/2016  8:00 PM  Output (mL) 225 mL 09/13/2016  5:00 AM    Microbiology/Sepsis markers: Results for orders placed or performed during the hospital encounter of 12/07/15  Urine culture     Status: None   Collection Time: 12/07/15 10:48 AM  Result Value Ref Range Status   Specimen Description URINE, CLEAN CATCH  Final   Special Requests NONE  Final   Culture   Final    NO GROWTH 2 DAYS Performed at [facility]    Report Status 12/09/2015 FINAL  Final    Anti-infectives:  Anti-infectives    None      Best Practice/Protocols:  VTE Prophylaxis: Mechanical GI Prophylaxis: Proton Pump Inhibitor Continous Sedation Also on a bit of Levo  Consults: Treatment Team:  Consuella Lose, MD    Events:  Subjective:    Overnight Issues: Patient stable overnight.  No AM labs  Objective:  Vital signs for last 24 hours: Temp:  [99.7 F (37.6 C)-101.3 F (38.5 C)] 100.2 F (  37.9 C) (12/07 0700) Pulse Rate:  [95-120] 117 (12/07 0700) Resp:  [20-32] 30 (12/07 0700) BP: (91-130)/(50-104) 111/73 (12/07 0700) SpO2:  [95 %-100 %] 99 % (12/07 0727) Arterial Line BP: (91-144)/(50-74) 107/62 (12/07 0700) FiO2 (%):  [40 %-70 %] 40 % (12/07 0727) Weight:  [99.1 kg (218 lb 7.6 oz)] 99.1 kg (218 lb 7.6 oz) (12/07 0407)  Hemodynamic parameters for last 24 hours: CVP:  [2 mmHg-12 mmHg] 10  mmHg  Intake/Output from previous day: 12/06 0701 - 12/07 0700 In: 5842.4 [I.V.:4167.8; Blood:673.3; NG/GT:181.2; IV Piggyback:820] Out: 2117 [Urine:2117]  Intake/Output this shift: No intake/output data recorded.  Vent settings for last 24 hours: Vent Mode: PRVC FiO2 (%):  [40 %-70 %] 40 % Set Rate:  [30 bmp-530 bmp] 30 bmp Vt Set:  [530 mL] 530 mL PEEP:  [5 cmH20-8 cmH20] 8 cmH20 Plateau Pressure:  [13 [id number] cmH20] 21 cmH20  Physical Exam:  General: no respiratory distress and gets agitated easily.  apparently will follow commands for nurses Neuro: nonfocal exam, confused, RASS 0 and agitated Resp: clear to auscultation bilaterally CVS: Sinus tachycardia GI: soft, nontender, BS WNL, no r/g and Tolerating trickly tube feedings Extremities: no edema, no erythema, pulses WNL  Results for orders placed or performed during the hospital encounter of 09/11/16 (from the past 24 hour(s))  Prepare fresh frozen plasma     Status: None   Collection Time: 09/12/16  7:46 AM  Result Value Ref Range   ISSUE DATE / TIME [id number]    Blood Product Unit Number [id number]    PRODUCT CODE [id number]    Unit Type and Rh 9500    Blood Product Expiration Date [id number]    ISSUE DATE / TIME [id number]    Blood Product Unit Number [id number]    PRODUCT CODE [id number]    Unit Type and Rh 5100    Blood Product Expiration Date 201712092359    ISSUE DATE / TIME [id number]    Blood Product Unit Number [id number]    PRODUCT CODE [id number]    Unit Type and Rh [id number]    Blood Product Expiration Date P2200757    ISSUE DATE / TIME [id number]    Blood Product Unit Number [id number]    PRODUCT CODE [id number]    Unit Type and Rh 6200    Blood Product Expiration Date P2200757   Lactic acid, plasma     Status: Abnormal   Collection Time: 09/12/16  7:55 AM  Result Value Ref Range   Lactic Acid, Venous 3.6 (HH) 0.5 - 1.9 mmol/L  Provider-confirm verbal Blood Bank order - RBC,  FFP, Type & Screen; 2 Units; Order taken: 09/11/2016; 8:40 PM; Level 1 Trauma, Emergency Release, STAT 2 units of O negative red cells and 2 units of A plasma emergency released to the ER @ 2045. All ...     Status: None   Collection Time: 09/12/16  9:00 AM  Result Value Ref Range   Blood product order confirm MD AUTHORIZATION REQUESTED   BLOOD TRANSFUSION REPORT - SCANNED     Status: None   Collection Time: 09/12/16  9:50 AM   Narrative   Ordered by an unspecified provider.  Magnesium     Status: Abnormal   Collection Time: 09/12/16 11:07 AM  Result Value Ref Range   Magnesium 1.6 (L) 1.7 - 2.4 mg/dL  Phosphorus     Status: None   Collection Time: 09/12/16 11:07 AM  Result Value Ref Range   Phosphorus 3.7 2.5 - 4.6  mg/dL  CBC with Differential/Platelet     Status: Abnormal   Collection Time: 09/12/16 12:00 PM  Result Value Ref Range   WBC 10.3 4.0 - 10.5 K/uL   RBC 3.64 (L) 3.87 - 5.11 MIL/uL   Hemoglobin 10.6 (L) 12.0 - 15.0 g/dL   HCT 32.6 (L) 36.0 - 46.0 %   MCV 89.6 78.0 - 100.0 fL   MCH 29.1 26.0 - 34.0 pg   MCHC 32.5 30.0 - 36.0 g/dL   RDW 19.1 (H) 11.5 - 15.5 %   Platelets 131 (L) 150 - 400 K/uL   Neutrophils Relative % 93 %   Lymphocytes Relative 3 %   Monocytes Relative 4 %   Eosinophils Relative 0 %   Basophils Relative 0 %   Neutro Abs 9.6 (H) 1.7 - 7.7 K/uL   Lymphs Abs 0.3 (L) 0.7 - 4.0 K/uL   Monocytes Absolute 0.4 0.1 - 1.0 K/uL   Eosinophils Absolute 0.0 0.0 - 0.7 K/uL   Basophils Absolute 0.0 0.0 - 0.1 K/uL   WBC Morphology INCREASED BANDS (>20% BANDS)   Lactic acid, plasma     Status: Abnormal   Collection Time: 09/12/16 12:00 PM  Result Value Ref Range   Lactic Acid, Venous 3.3 (HH) 0.5 - 1.9 mmol/L  Glucose, capillary     Status: Abnormal   Collection Time: 09/12/16  4:07 PM  Result Value Ref Range   Glucose-Capillary 112 (H) 65 - 99 mg/dL  Magnesium     Status: Abnormal   Collection Time: 09/12/16  5:30 PM  Result Value Ref Range   Magnesium 1.6  (L) 1.7 - 2.4 mg/dL  Phosphorus     Status: None   Collection Time: 09/12/16  5:30 PM  Result Value Ref Range   Phosphorus 4.1 2.5 - 4.6 mg/dL  Glucose, capillary     Status: Abnormal   Collection Time: 09/12/16  7:49 PM  Result Value Ref Range   Glucose-Capillary 137 (H) 65 - 99 mg/dL  Glucose, capillary     Status: Abnormal   Collection Time: 09/12/16 11:31 PM  Result Value Ref Range   Glucose-Capillary 168 (H) 65 - 99 mg/dL  Glucose, capillary     Status: Abnormal   Collection Time: 09/13/16  3:50 AM  Result Value Ref Range   Glucose-Capillary 121 (H) 65 - 99 mg/dL  Magnesium     Status: Abnormal   Collection Time: 09/13/16  5:20 AM  Result Value Ref Range   Magnesium 1.6 (L) 1.7 - 2.4 mg/dL  Phosphorus     Status: None   Collection Time: 09/13/16  5:20 AM  Result Value Ref Range   Phosphorus 4.2 2.5 - 4.6 mg/dL     Assessment/Plan:   NEURO  Altered Mental Status:  agitation and sedation   Plan: Try to let the patient awaken with weaning sedation  PULM  Atelectasis/collapse (bibasilar)   Plan: COntinue ventilation until appropriate to extubate  CARDIO  Sinus Tachycardia   Plan: No specific treatment  RENAL  Urine output has improved from yesterday.  CVP low    Plan: May need more volume resuscitation.  Will recheck serum lactic acid level.  GI  No signficant GI issues   Plan: CPM  ID  No known infectious sources   Plan: CPM  HEME  Anemia acute blood loss anemia)   Plan: Awaiting AM labs to see if further blood products are  needed  ENDO No specific issues   Plan: CPM  Global Issues  Patient is easily aroused, and may be able to extubate today.  Will not follow commands, but breaths well on her own.  Had a significant metabolic acidosis yesterday.  ABG pending from this AM.  On a small amount of Levophed    LOS: 2 days   Additional comments:I reviewed the patient's new clinical lab test results. all pending and I reviewed the patients new imaging test  results. Pending, but from yesterday had some atelectasis  Critical Care Total Time*: 30 Minutes  Cordarrell Sane 09/13/2016  *Care during the described time interval was provided by me and/or other providers on the critical care team.  I have reviewed this patient's available data, including medical history, events of note, physical examination and test results as part of my evaluation.
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
## 2016-09-14 ENCOUNTER — Encounter (HOSPITAL_COMMUNITY): Payer: Self-pay | Admitting: Cardiology

## 2016-09-14 ENCOUNTER — Inpatient Hospital Stay (HOSPITAL_COMMUNITY): Payer: Medicaid Other

## 2016-09-14 DIAGNOSIS — I472 Ventricular tachycardia: Secondary | ICD-10-CM

## 2016-09-14 DIAGNOSIS — E876 Hypokalemia: Secondary | ICD-10-CM

## 2016-09-14 LAB — POCT I-STAT 3, ART BLOOD GAS (G3+)
ACID-BASE DEFICIT: 4 mmol/L — AB (ref 0.0–2.0)
Acid-base deficit: 6 mmol/L — ABNORMAL HIGH (ref 0.0–2.0)
BICARBONATE: 19.6 mmol/L — AB (ref 20.0–28.0)
Bicarbonate: 22.3 mmol/L (ref 20.0–28.0)
O2 SAT: 97 %
O2 Saturation: 98 %
PCO2 ART: 41.7 mmHg (ref 32.0–48.0)
TCO2: 21 mmol/L (ref 0–100)
TCO2: 24 mmol/L (ref 0–100)
pCO2 arterial: 47.4 mmHg (ref 32.0–48.0)
pH, Arterial: 7.286 — ABNORMAL LOW (ref 7.350–7.450)
pH, Arterial: 7.289 — ABNORMAL LOW (ref 7.350–7.450)
pO2, Arterial: 118 mmHg — ABNORMAL HIGH (ref 83.0–108.0)
pO2, Arterial: 109 mmHg — ABNORMAL HIGH (ref 83.0–108.0)

## 2016-09-14 LAB — CBC WITH DIFFERENTIAL/PLATELET
BASOS PCT: 0 %
BASOS PCT: 0 %
Basophils Absolute: 0 10*3/uL (ref 0.0–0.1)
Basophils Absolute: 0 10*3/uL (ref 0.0–0.1)
EOS PCT: 1 %
EOS PCT: 1 %
Eosinophils Absolute: 0 10*3/uL (ref 0.0–0.7)
Eosinophils Absolute: 0 10*3/uL (ref 0.0–0.7)
HEMATOCRIT: 24.6 % — AB (ref 36.0–46.0)
HEMATOCRIT: 25.5 % — AB (ref 36.0–46.0)
HEMOGLOBIN: 7.9 g/dL — AB (ref 12.0–15.0)
Hemoglobin: 8.2 g/dL — ABNORMAL LOW (ref 12.0–15.0)
Lymphocytes Relative: 8 %
Lymphocytes Relative: 8 %
Lymphs Abs: 0.4 10*3/uL — ABNORMAL LOW (ref 0.7–4.0)
Lymphs Abs: 0.4 10*3/uL — ABNORMAL LOW (ref 0.7–4.0)
MCH: 29.2 pg (ref 26.0–34.0)
MCH: 29.1 pg (ref 26.0–34.0)
MCHC: 32.1 g/dL (ref 30.0–36.0)
MCHC: 32.2 g/dL (ref 30.0–36.0)
MCV: 90.8 fL (ref 78.0–100.0)
MCV: 90.4 fL (ref 78.0–100.0)
MONO ABS: 0.3 10*3/uL (ref 0.1–1.0)
MONO ABS: 0.2 10*3/uL (ref 0.1–1.0)
MONOS PCT: 6 %
MONOS PCT: 5 %
NEUTROS PCT: 85 %
NEUTROS PCT: 86 %
Neutro Abs: 3.8 10*3/uL (ref 1.7–7.7)
Neutro Abs: 4 10*3/uL (ref 1.7–7.7)
Platelets: 106 10*3/uL — ABNORMAL LOW (ref 150–400)
Platelets: 100 10*3/uL — ABNORMAL LOW (ref 150–400)
RBC: 2.71 MIL/uL — AB (ref 3.87–5.11)
RBC: 2.82 MIL/uL — AB (ref 3.87–5.11)
RDW: 19.7 % — AB (ref 11.5–15.5)
RDW: 19.5 % — AB (ref 11.5–15.5)
WBC MORPHOLOGY: INCREASED
WBC MORPHOLOGY: INCREASED
WBC: 4.5 10*3/uL (ref 4.0–10.5)
WBC: 4.6 10*3/uL (ref 4.0–10.5)

## 2016-09-14 LAB — GLUCOSE, CAPILLARY
GLUCOSE-CAPILLARY: 110 mg/dL — AB (ref 65–99)
GLUCOSE-CAPILLARY: 125 mg/dL — AB (ref 65–99)
Glucose-Capillary: 110 mg/dL — ABNORMAL HIGH (ref 65–99)
Glucose-Capillary: 113 mg/dL — ABNORMAL HIGH (ref 65–99)
Glucose-Capillary: 112 mg/dL — ABNORMAL HIGH (ref 65–99)
Glucose-Capillary: 139 mg/dL — ABNORMAL HIGH (ref 65–99)

## 2016-09-14 LAB — BASIC METABOLIC PANEL
ANION GAP: 9 (ref 5–15)
ANION GAP: 7 (ref 5–15)
Anion gap: 5 (ref 5–15)
BUN: 39 mg/dL — AB (ref 6–20)
BUN: 37 mg/dL — ABNORMAL HIGH (ref 6–20)
BUN: 38 mg/dL — AB (ref 6–20)
CALCIUM: 7.4 mg/dL — AB (ref 8.9–10.3)
CHLORIDE: 122 mmol/L — AB (ref 101–111)
CO2: 20 mmol/L — ABNORMAL LOW (ref 22–32)
CO2: 22 mmol/L (ref 22–32)
CO2: 23 mmol/L (ref 22–32)
CREATININE: 1.61 mg/dL — AB (ref 0.44–1.00)
CREATININE: 1.35 mg/dL — AB (ref 0.44–1.00)
Calcium: 7.3 mg/dL — ABNORMAL LOW (ref 8.9–10.3)
Calcium: 7.5 mg/dL — ABNORMAL LOW (ref 8.9–10.3)
Chloride: 124 mmol/L — ABNORMAL HIGH (ref 101–111)
Chloride: 129 mmol/L — ABNORMAL HIGH (ref 101–111)
Creatinine, Ser: 1.59 mg/dL — ABNORMAL HIGH (ref 0.44–1.00)
GFR, EST AFRICAN AMERICAN: 43 mL/min — AB (ref >60)
GFR, EST AFRICAN AMERICAN: 43 mL/min — AB (ref >60)
GFR, EST AFRICAN AMERICAN: 53 mL/min — AB (ref >60)
GFR, EST NON AFRICAN AMERICAN: 37 mL/min — AB (ref >60)
GFR, EST NON AFRICAN AMERICAN: 37 mL/min — AB (ref >60)
GFR, EST NON AFRICAN AMERICAN: 46 mL/min — AB (ref >60)
Glucose, Bld: 115 mg/dL — ABNORMAL HIGH (ref 65–99)
Glucose, Bld: 109 mg/dL — ABNORMAL HIGH (ref 65–99)
Glucose, Bld: 110 mg/dL — ABNORMAL HIGH (ref 65–99)
POTASSIUM: 3.2 mmol/L — AB (ref 3.5–5.1)
POTASSIUM: 3.2 mmol/L — AB (ref 3.5–5.1)
Potassium: 3.9 mmol/L (ref 3.5–5.1)
SODIUM: 157 mmol/L — AB (ref 135–145)
Sodium: 151 mmol/L — ABNORMAL HIGH (ref 135–145)
Sodium: 153 mmol/L — ABNORMAL HIGH (ref 135–145)

## 2016-09-14 LAB — LACTIC ACID, PLASMA
Lactic Acid, Venous: 1.4 mmol/L (ref 0.5–1.9)
Lactic Acid, Venous: 1.7 mmol/L (ref 0.5–1.9)

## 2016-09-14 LAB — MAGNESIUM: MAGNESIUM: 2 mg/dL (ref 1.7–2.4)

## 2016-09-14 MED ORDER — POTASSIUM CHLORIDE IN NACL 20-0.45 MEQ/L-% IV SOLN
INTRAVENOUS | Status: DC
  Administered 2016-09-14 – 2016-09-16 (×5): via INTRAVENOUS
  Administered 2016-09-16 – 2016-09-17 (×2): 1000 mL via INTRAVENOUS
  Administered 2016-09-17 – 2016-09-27 (×11): via INTRAVENOUS
  Filled 2016-09-14 (×26): qty 1000

## 2016-09-14 MED ORDER — MIDAZOLAM HCL 2 MG/2ML IJ SOLN
2.0000 mg | INTRAMUSCULAR | Status: DC | PRN
  Administered 2016-09-21 – 2016-09-25 (×9): 2 mg via INTRAVENOUS
  Filled 2016-09-14 (×7): qty 2

## 2016-09-14 MED ORDER — SODIUM CHLORIDE 0.9 % IV BOLUS (SEPSIS)
1000.0000 mL | Freq: Once | INTRAVENOUS | Status: AC
  Administered 2016-09-14: 1000 mL via INTRAVENOUS

## 2016-09-14 MED ORDER — POTASSIUM CHLORIDE 20 MEQ/15ML (10%) PO SOLN
40.0000 meq | Freq: Two times a day (BID) | ORAL | Status: AC
  Administered 2016-09-14 – 2016-09-15 (×4): 40 meq via ORAL
  Filled 2016-09-14 (×4): qty 30

## 2016-09-14 MED ORDER — MIDAZOLAM HCL 2 MG/2ML IJ SOLN
2.0000 mg | INTRAMUSCULAR | Status: DC | PRN
  Administered 2016-09-14: 2 mg via INTRAVENOUS
  Filled 2016-09-14 (×3): qty 2

## 2016-09-14 MED ORDER — SODIUM BICARBONATE 8.4 % IV SOLN
100.0000 meq | Freq: Once | INTRAVENOUS | Status: AC
  Administered 2016-09-14: 100 meq via INTRAVENOUS
  Filled 2016-09-14: qty 100
  Filled 2016-09-14: qty 50

## 2016-09-14 MED ORDER — DEXMEDETOMIDINE HCL IN NACL 200 MCG/50ML IV SOLN
0.0000 ug/kg/h | INTRAVENOUS | Status: DC
  Administered 2016-09-14: 0.4 ug/kg/h via INTRAVENOUS
  Administered 2016-09-14: 0.8 ug/kg/h via INTRAVENOUS
  Administered 2016-09-14: 0.6 ug/kg/h via INTRAVENOUS
  Filled 2016-09-14 (×3): qty 50

## 2016-09-14 NOTE — Procedures (Signed)
Pt moved to [id number] without complications.

## 2016-09-14 NOTE — Progress Notes (Signed)
Patient with an apparent 7 beat run of V-tach.  Although the strip looks like possible small burst of SVT with some widening.  Will get cardiology to see the patient and replacing K+.  Kathryne Eriksson. [name], MD, [place] [phone] Trauma Surgeon

## 2016-09-14 NOTE — Progress Notes (Signed)
Pt had 7 beat run of vtach at 0945.  Notified Dr. Hulen Skains of this.  Pt stable on vent, no other changes.  Cards consult in place.  Will continue to monitor pt.

## 2016-09-14 NOTE — Progress Notes (Signed)
Decreased FIO2 30 due to abg results.

## 2016-09-14 NOTE — Progress Notes (Addendum)
Increased FIO2 50% and increased PSV 10 due to low sats and low VT. Per Dr Hulen Skains to continue to wean on settings. MD aware

## 2016-09-14 NOTE — Progress Notes (Signed)
Follow up - Trauma and Critical Care  Patient Details:    Jacqueline [name] is an 50 y.o. female.  Lines/tubes : Airway 7.5 mm (Active)  Secured at (cm) 24 cm 09/14/2016  4:49 AM  Measured From Lips 09/14/2016  4:49 AM  Secured Location Right 09/14/2016  4:49 AM  Secured By [employer] 09/14/2016  4:49 AM  Tube Holder Repositioned Yes 09/14/2016  4:49 AM  Cuff Pressure (cm H2O) 28 cm H2O 09/13/2016  7:27 AM  Site Condition Dry 09/14/2016  4:49 AM     CVC Triple Lumen 09/12/16 Right Subclavian (Active)  Indication for Insertion or Continuance of Line Vasoactive infusions 09/13/2016  8:00 PM  Site Assessment Clean;Dry;Intact 09/13/2016  8:00 PM  Proximal Lumen Status Infusing 09/13/2016  8:00 PM  Medial Infusing 09/13/2016  8:00 PM  Distal Lumen Status Other (Comment) 09/13/2016  8:00 PM  Dressing Type Transparent;Occlusive 09/13/2016  8:00 PM  Dressing Status Clean;Dry;Intact;Antimicrobial disc in place 09/13/2016  8:00 PM  Line Care Connections checked and tightened;Zeroed and calibrated;Leveled 09/13/2016  8:00 PM  Dressing Change Due 09/19/16 09/13/2016  8:00 AM     NG/OG Tube Orogastric Left mouth Xray Measured external length of tube (Active)  Site Assessment Clean;Dry;Intact 09/13/2016  8:00 PM  Ongoing Placement Verification Auscultation 09/13/2016  8:00 PM  Status Infusing tube feed 09/13/2016  8:00 PM  Intake (mL) 60 mL 09/12/2016  9:30 PM     Urethral Catheter Brayton Layman RN charge nurse Temperature probe 14 Fr. (Active)  Indication for Insertion or Continuance of Catheter Unstable critical patients (first 24-48 hours) 09/13/2016  8:00 PM  Site Assessment Clean;Intact 09/13/2016  8:00 PM  Catheter Maintenance Bag below level of bladder;Catheter secured;Drainage bag/tubing not touching floor;Insertion date on drainage bag;No dependent loops;Seal intact 09/13/2016  8:00 PM  Collection Container Standard drainage bag 09/13/2016  8:00 PM  Securement Method Securing device (Describe)  09/13/2016  8:00 PM  Urinary Catheter Interventions Unclamped 09/13/2016  8:00 AM  Output (mL) 500 mL 09/14/2016  6:48 AM    Microbiology/Sepsis markers: Results for orders placed or performed during the hospital encounter of 12/07/15  Urine culture     Status: None   Collection Time: 12/07/15 10:48 AM  Result Value Ref Range Status   Specimen Description URINE, CLEAN CATCH  Final   Special Requests NONE  Final   Culture   Final    NO GROWTH 2 DAYS Performed at [facility]    Report Status 12/09/2015 FINAL  Final    Anti-infectives:  Anti-infectives    None      Best Practice/Protocols:  VTE Prophylaxis: Lovenox (prophylaxtic dose) and Mechanical GI Prophylaxis: Proton Pump Inhibitor Continous Sedation  Consults: Treatment Team:  Consuella Lose, MD Newman Pies, MD    Events:  Subjective:    Overnight Issues: Patient has done well overnight.  Placed back on the ventilator.  FIO2 increased during the evening.  Objective:  Vital signs for last 24 hours: Temp:  [99.5 F (37.5 C)-100.8 F (38.2 C)] 100.6 F (38.1 C) (12/08 0700) Pulse Rate:  [93-113] 98 (12/08 0000) Resp:  [18-30] 30 (12/08 0700) BP: (95-123)/(56-74) 123/73 (12/08 0700) SpO2:  [91 %-100 %] 98 % (12/08 0700) Arterial Line BP: (91-115)/(47-64) 102/54 (12/07 2200) FiO2 (%):  [40 %-50 %] 50 % (12/08 0700) Weight:  [100.4 kg (221 lb 5.5 oz)] 100.4 kg (221 lb 5.5 oz) (12/08 0439)  Hemodynamic parameters for last 24 hours: CVP:  [12 mmHg-16 mmHg] 16 mmHg  Intake/Output from previous day: 12/07 0701 - 12/08 0700 In: 4976.9 [I.V.:3308.5; NG/GT:563.3; IV Piggyback:1105] Out: 2835 [Urine:2835]  Intake/Output this shift: No intake/output data recorded.  Vent settings for last 24 hours: Vent Mode: PRVC FiO2 (%):  [40 %-50 %] 50 % Set Rate:  [30 bmp] 30 bmp Vt Set:  [530 mL] 530 mL PEEP:  [5 cmH20] 5 cmH20 Pressure Support:  [8 cmH20] 8 cmH20 Plateau Pressure:  [18 cmH20-23 cmH20] 23  cmH20  Physical Exam:  General: no respiratory distress and will awaken and follwo commands Neuro: oriented, nonfocal exam, RASS -1 and follows commands Resp: clear to auscultation bilaterally and CXR mostly clear with LLL atelectasis CVS: regular rate and rhythm, S1, S2 normal, no murmur, click, rub or gallop and intermittent tachycardia GI: soft, nontender, BS WNL, no r/g and Had been tolerating tube feedings well. Extremities: no edema, no erythema, pulses WNL  Results for orders placed or performed during the hospital encounter of 09/11/16 (from the past 24 hour(s))  Glucose, capillary     Status: Abnormal   Collection Time: 09/13/16  8:29 AM  Result Value Ref Range   Glucose-Capillary 116 (H) 65 - 99 mg/dL   Comment 1 Notify RN    Comment 2 Document in Chart   Lactic acid, plasma     Status: Abnormal   Collection Time: 09/13/16  8:40 AM  Result Value Ref Range   Lactic Acid, Venous 2.0 (HH) 0.5 - 1.9 mmol/L  Glucose, capillary     Status: Abnormal   Collection Time: 09/13/16 12:01 PM  Result Value Ref Range   Glucose-Capillary 133 (H) 65 - 99 mg/dL  Glucose, capillary     Status: Abnormal   Collection Time: 09/13/16  4:32 PM  Result Value Ref Range   Glucose-Capillary 102 (H) 65 - 99 mg/dL  Glucose, capillary     Status: Abnormal   Collection Time: 09/13/16  7:44 PM  Result Value Ref Range   Glucose-Capillary 119 (H) 65 - 99 mg/dL  Glucose, capillary     Status: Abnormal   Collection Time: 09/13/16 11:50 PM  Result Value Ref Range   Glucose-Capillary 118 (H) 65 - 99 mg/dL  Glucose, capillary     Status: Abnormal   Collection Time: 09/14/16  3:09 AM  Result Value Ref Range   Glucose-Capillary 110 (H) 65 - 99 mg/dL  CBC with Differential/Platelet     Status: Abnormal (Preliminary result)   Collection Time: 09/14/16  6:50 AM  Result Value Ref Range   WBC 4.5 4.0 - 10.5 K/uL   RBC 2.71 (L) 3.87 - 5.11 MIL/uL   Hemoglobin 7.9 (L) 12.0 - 15.0 g/dL   HCT 24.6 (L) 36.0 -  46.0 %   MCV 90.8 78.0 - 100.0 fL   MCH 29.2 26.0 - 34.0 pg   MCHC 32.1 30.0 - 36.0 g/dL   RDW 19.7 (H) 11.5 - 15.5 %   Platelets PENDING 150 - 400 K/uL   Neutrophils Relative % PENDING %   Neutro Abs PENDING 1.7 - 7.7 K/uL   Band Neutrophils PENDING %   Lymphocytes Relative PENDING %   Lymphs Abs PENDING 0.7 - 4.0 K/uL   Monocytes Relative PENDING %   Monocytes Absolute PENDING 0.1 - 1.0 K/uL   Eosinophils Relative PENDING %   Eosinophils Absolute PENDING 0.0 - 0.7 K/uL   Basophils Relative PENDING %   Basophils Absolute PENDING 0.0 - 0.1 K/uL   WBC Morphology PENDING    RBC Morphology PENDING  Smear Review PENDING    nRBC PENDING 0 /100 WBC   Metamyelocytes Relative PENDING %   Myelocytes PENDING %   Promyelocytes Absolute PENDING %   Blasts PENDING %  Basic metabolic panel     Status: Abnormal   Collection Time: 09/14/16  6:50 AM  Result Value Ref Range   Sodium 151 (H) 135 - 145 mmol/L   Potassium 3.2 (L) 3.5 - 5.1 mmol/L   Chloride 122 (H) 101 - 111 mmol/L   CO2 20 (L) 22 - 32 mmol/L   Glucose, Bld 115 (H) 65 - 99 mg/dL   BUN 39 (H) 6 - 20 mg/dL   Creatinine, Ser 1.61 (H) 0.44 - 1.00 mg/dL   Calcium 7.3 (L) 8.9 - 10.3 mg/dL   GFR calc non Af Amer 37 (L) >60 mL/min   GFR calc Af Amer 43 (L) >60 mL/min   Anion gap 9 5 - 15  Glucose, capillary     Status: Abnormal   Collection Time: 09/14/16  7:30 AM  Result Value Ref Range   Glucose-Capillary 110 (H) 65 - 99 mg/dL     Assessment/Plan:   NEURO  Altered Mental Status:  agitation and sedation   Plan: Wean sedation, possibly extubate later today.  PULM  Respiratory Alkalosis (compensatory) Atelectasis/collapse (focal and LLL mild)   Plan: Still wean  CARDIO  Sinus Tachycardia   Plan: No specific treatment  RENAL  Actue Renal Failure (acute tubular necrosis and due to hypovolemia/decreased circulating volume) Immproving renal function, urine output is good.   Plan: Will repeat electrolytes as they are  significantly worse than yesterday.  Just want to make sure that the changes are real.  GI  No specific problems   Plan: CPM  ID  No known infectious problems   Plan: CPM  HEME  Anemia acute blood loss anemia)   Plan: Hemoglobin down to 7.6, but significantly lower than yesterday.  This could be from the rehydration, but will recheck  ENDO Hypokalemia (moderate (2.8 - 3.5 meq/dl)) and Hypernatremia (moderate (146 - 155 meq/dl))   Plan: Will recheck but have changed IVFs.  Global Issues  Probably will be able to wean the patient for extubation today.  Doing well otherwise. Recheck labs including lactic acid level, ABG.    LOS: 3 days   Additional comments:I reviewed the patient's new clinical lab test results. cbc/bmet and I reviewed the patients new imaging test results. cxr  Critical Care Total Time*: 30 Minutes  Erma Raiche 09/14/2016  *Care during the described time interval was provided by me and/or other providers on the critical care team.  I have reviewed this patient's available data, including medical history, events of note, physical examination and test results as part of my evaluation.

## 2016-09-14 NOTE — Progress Notes (Signed)
Ok to wean per Dr Hulen Skains
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
## 2016-09-15 ENCOUNTER — Other Ambulatory Visit (HOSPITAL_COMMUNITY): Payer: Self-pay

## 2016-09-15 ENCOUNTER — Inpatient Hospital Stay (HOSPITAL_COMMUNITY): Payer: Medicaid Other

## 2016-09-15 DIAGNOSIS — R9431 Abnormal electrocardiogram [ECG] [EKG]: Secondary | ICD-10-CM

## 2016-09-15 LAB — PHOSPHORUS: Phosphorus: 1.4 mg/dL — ABNORMAL LOW (ref 2.5–4.6)

## 2016-09-15 LAB — BASIC METABOLIC PANEL
BUN: 39 mg/dL — AB (ref 6–20)
CO2: 23 mmol/L (ref 22–32)
CREATININE: 1.37 mg/dL — AB (ref 0.44–1.00)
Calcium: 7.5 mg/dL — ABNORMAL LOW (ref 8.9–10.3)
Chloride: >130 mmol/L (ref 101–111)
GFR calc Af Amer: 52 mL/min — ABNORMAL LOW (ref >60)
GFR, EST NON AFRICAN AMERICAN: 45 mL/min — AB (ref >60)
Glucose, Bld: 114 mg/dL — ABNORMAL HIGH (ref 65–99)
POTASSIUM: 3.7 mmol/L (ref 3.5–5.1)
Sodium: 160 mmol/L — ABNORMAL HIGH (ref 135–145)

## 2016-09-15 LAB — TRIGLYCERIDES: TRIGLYCERIDES: 144 mg/dL (ref <150)

## 2016-09-15 LAB — GLUCOSE, CAPILLARY
Glucose-Capillary: 118 mg/dL — ABNORMAL HIGH (ref 65–99)
Glucose-Capillary: 112 mg/dL — ABNORMAL HIGH (ref 65–99)
Glucose-Capillary: 151 mg/dL — ABNORMAL HIGH (ref 65–99)
Glucose-Capillary: 139 mg/dL — ABNORMAL HIGH (ref 65–99)
Glucose-Capillary: 105 mg/dL — ABNORMAL HIGH (ref 65–99)

## 2016-09-15 LAB — CBC WITH DIFFERENTIAL/PLATELET
BASOS ABS: 0 10*3/uL (ref 0.0–0.1)
Basophils Relative: 0 %
EOS ABS: 0.1 10*3/uL (ref 0.0–0.7)
EOS PCT: 2 %
HCT: 24.9 % — ABNORMAL LOW (ref 36.0–46.0)
Hemoglobin: 7.9 g/dL — ABNORMAL LOW (ref 12.0–15.0)
LYMPHS PCT: 12 %
Lymphs Abs: 0.6 10*3/uL — ABNORMAL LOW (ref 0.7–4.0)
MCH: 29.5 pg (ref 26.0–34.0)
MCHC: 31.7 g/dL (ref 30.0–36.0)
MCV: 92.9 fL (ref 78.0–100.0)
MONO ABS: 0.7 10*3/uL (ref 0.1–1.0)
Monocytes Relative: 13 %
Neutro Abs: 3.6 10*3/uL (ref 1.7–7.7)
Neutrophils Relative %: 73 %
PLATELETS: 107 10*3/uL — AB (ref 150–400)
RBC: 2.68 MIL/uL — ABNORMAL LOW (ref 3.87–5.11)
RDW: 19.6 % — AB (ref 11.5–15.5)
WBC: 4.9 10*3/uL (ref 4.0–10.5)

## 2016-09-15 LAB — MAGNESIUM: Magnesium: 1.9 mg/dL (ref 1.7–2.4)

## 2016-09-15 MED ORDER — PNEUMOCOCCAL VAC POLYVALENT 25 MCG/0.5ML IJ INJ
0.5000 mL | INJECTION | INTRAMUSCULAR | Status: AC
  Administered 2016-09-16: 0.5 mL via INTRAMUSCULAR
  Filled 2016-09-15: qty 0.5

## 2016-09-15 MED ORDER — FREE WATER
200.0000 mL | Status: DC
  Administered 2016-09-15 – 2016-09-17 (×13): 200 mL

## 2016-09-15 MED ORDER — PHENYLEPHRINE HCL 10 MG/ML IJ SOLN
0.0000 ug/min | INTRAVENOUS | Status: DC
  Administered 2016-09-15: 40 ug/min via INTRAVENOUS
  Administered 2016-09-15: 20 ug/min via INTRAVENOUS
  Filled 2016-09-15 (×2): qty 1

## 2016-09-15 MED ORDER — PROPOFOL 1000 MG/100ML IV EMUL
5.0000 ug/kg/min | INTRAVENOUS | Status: DC
  Administered 2016-09-15 (×2): 15 ug/kg/min via INTRAVENOUS
  Administered 2016-09-15: 10 ug/kg/min via INTRAVENOUS
  Administered 2016-09-16: 15 ug/kg/min via INTRAVENOUS
  Administered 2016-09-16: 25 ug/kg/min via INTRAVENOUS
  Administered 2016-09-16: 40 ug/kg/min via INTRAVENOUS
  Administered 2016-09-17: 80 ug/kg/min via INTRAVENOUS
  Administered 2016-09-17: 40 ug/kg/min via INTRAVENOUS
  Administered 2016-09-17: 80 ug/kg/min via INTRAVENOUS
  Administered 2016-09-17: 40 ug/kg/min via INTRAVENOUS
  Administered 2016-09-17 (×2): 80 ug/kg/min via INTRAVENOUS
  Administered 2016-09-18 (×5): 70 ug/kg/min via INTRAVENOUS
  Administered 2016-09-18 (×3): 80 ug/kg/min via INTRAVENOUS
  Administered 2016-09-19: 70 ug/kg/min via INTRAVENOUS
  Administered 2016-09-19: 40 ug/kg/min via INTRAVENOUS
  Administered 2016-09-19: 70 ug/kg/min via INTRAVENOUS
  Administered 2016-09-19: 45 ug/kg/min via INTRAVENOUS
  Administered 2016-09-19: 70 ug/kg/min via INTRAVENOUS
  Administered 2016-09-19: 40 ug/kg/min via INTRAVENOUS
  Administered 2016-09-19: 70 ug/kg/min via INTRAVENOUS
  Administered 2016-09-19: 40 ug/kg/min via INTRAVENOUS
  Administered 2016-09-20: 25 ug/kg/min via INTRAVENOUS
  Administered 2016-09-20: 35 ug/kg/min via INTRAVENOUS
  Administered 2016-09-20: 40 ug/kg/min via INTRAVENOUS
  Administered 2016-09-21: 25 ug/kg/min via INTRAVENOUS
  Administered 2016-09-21: 19.92 ug/kg/min via INTRAVENOUS
  Filled 2016-09-15 (×16): qty 100
  Filled 2016-09-15: qty 200
  Filled 2016-09-15 (×18): qty 100

## 2016-09-15 MED ORDER — ALBUMIN HUMAN 5 % IV SOLN: 25.0000 g | Freq: Once | INTRAVENOUS | Status: DC

## 2016-09-15 MED ORDER — PROPOFOL 1000 MG/100ML IV EMUL
INTRAVENOUS | Status: AC
  Filled 2016-09-15: qty 100

## 2016-09-15 MED ORDER — ALBUMIN HUMAN 5 % IV SOLN
12.5000 g | Freq: Once | INTRAVENOUS | Status: AC
  Administered 2016-09-15: 12.5 g via INTRAVENOUS
  Filled 2016-09-15: qty 250

## 2016-09-15 MED ORDER — INFLUENZA VAC SPLIT QUAD 0.5 ML IM SUSY
0.5000 mL | PREFILLED_SYRINGE | INTRAMUSCULAR | Status: AC
  Administered 2016-09-16: 0.5 mL via INTRAMUSCULAR
  Filled 2016-09-15: qty 0.5

## 2016-09-15 NOTE — Progress Notes (Signed)
12.29 sec of SVT up to 169. Pt still hypotensive after 1 L bolus, restless and agitated at times. Notified Dr. Kieth Brightly. New orders to change precedex gtt to propofol gtt. And add Mag and Phos to morning labs that are to be drawn early.

## 2016-09-15 NOTE — Progress Notes (Signed)
CRITICAL VALUE ALERT  Critical value received:  Chloride >130  Date of notification:  09/15/2016   Time of notification:  0210  Critical value read back:Yes.    Nurse who received alert:  Beacher May, RN  MD notified (1st page):  Dr. Kieth Brightly  Time of first page:  0215  MD notified (2nd page):  Time of second page:  Responding MD:  Dr. Kieth Brightly  Time MD responded:  0217  Also notified MD of Na+ 160 and continued hypotension. New orders for 200 ml  free water per tube q4h and neo gtt.
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
## 2016-09-15 NOTE — Progress Notes (Signed)
  Echocardiogram 2D Echocardiogram has been performed.  Jacqueline [name] 09/15/2016, 5:18 PM
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
## 2016-09-15 NOTE — Progress Notes (Signed)
Patient restless and agitated. Unable to sedate. Hypotensive. CVP 16. Temp 102  Notified Dr. Kieth Brightly, who ordered for 1 L NS bolus to be given. Will monitor closely

## 2016-09-16 ENCOUNTER — Inpatient Hospital Stay (HOSPITAL_COMMUNITY): Payer: Medicaid Other

## 2016-09-16 LAB — BASIC METABOLIC PANEL
Anion gap: 7 (ref 5–15)
BUN: 31 mg/dL — ABNORMAL HIGH (ref 6–20)
CALCIUM: 8.1 mg/dL — AB (ref 8.9–10.3)
CO2: 24 mmol/L (ref 22–32)
Chloride: 125 mmol/L — ABNORMAL HIGH (ref 101–111)
Creatinine, Ser: 0.99 mg/dL (ref 0.44–1.00)
GFR calc Af Amer: >60 mL/min (ref >60)
GLUCOSE: 118 mg/dL — AB (ref 65–99)
POTASSIUM: 3.8 mmol/L (ref 3.5–5.1)
Sodium: 156 mmol/L — ABNORMAL HIGH (ref 135–145)

## 2016-09-16 LAB — CBC
HCT: 26.5 % — ABNORMAL LOW (ref 36.0–46.0)
Hemoglobin: 8.4 g/dL — ABNORMAL LOW (ref 12.0–15.0)
MCH: 29.8 pg (ref 26.0–34.0)
MCHC: 31.7 g/dL (ref 30.0–36.0)
MCV: 94 fL (ref 78.0–100.0)
PLATELETS: 102 10*3/uL — AB (ref 150–400)
RBC: 2.82 MIL/uL — ABNORMAL LOW (ref 3.87–5.11)
RDW: 19.8 % — AB (ref 11.5–15.5)
WBC: 6.4 10*3/uL (ref 4.0–10.5)

## 2016-09-16 LAB — ECHOCARDIOGRAM COMPLETE
AVLVOTPG: 5 mmHg
CHL CUP DOP CALC LVOT VTI: 29.4 cm
CHL CUP MV DEC (S): 197
CHL CUP TV REG PEAK VELOCITY: 345 cm/s
E/e' ratio: 6.69
EWDT: 197 ms
FS: 28 % (ref 28–44)
HEIGHTINCHES: 69 in
IVS/LV PW RATIO, ED: 1.03
LA diam end sys: 43 mm
LA diam index: 1.96 cm/m2
LASIZE: 43 mm
LAVOL: 70.5 mL
LAVOLA4C: 57.7 mL
LAVOLIN: 32.2 mL/m2
LDCA: 3.46 cm2
LV E/e' medial: 6.69
LV E/e'average: 6.69
LV PW d: 7.07 mm — AB (ref 0.6–1.1)
LV e' LATERAL: 15.1 cm/s
LVOT SV: 102 mL
LVOT peak vel: 114 cm/s
LVOTD: 21 mm
MV pk E vel: 101 m/s
MVPG: 4 mmHg
MVPKAVEL: 93.1 m/s
RV LATERAL S' VELOCITY: 13.9 cm/s
RV sys press: 53 mmHg
TAPSE: 29.1 mm
TDI e' lateral: 15.1
TDI e' medial: 11.5
TRMAXVEL: 345 cm/s
WEIGHTICAEL: 3650.82 [oz_av]

## 2016-09-16 LAB — GLUCOSE, CAPILLARY
GLUCOSE-CAPILLARY: 138 mg/dL — AB (ref 65–99)
Glucose-Capillary: 118 mg/dL — ABNORMAL HIGH (ref 65–99)
Glucose-Capillary: 140 mg/dL — ABNORMAL HIGH (ref 65–99)
Glucose-Capillary: 117 mg/dL — ABNORMAL HIGH (ref 65–99)
Glucose-Capillary: 120 mg/dL — ABNORMAL HIGH (ref 65–99)
Glucose-Capillary: 120 mg/dL — ABNORMAL HIGH (ref 65–99)

## 2016-09-16 MED ORDER — PIPERACILLIN-TAZOBACTAM 3.375 G IVPB
3.3750 g | Freq: Three times a day (TID) | INTRAVENOUS | Status: DC
  Administered 2016-09-16 – 2016-09-18 (×8): 3.375 g via INTRAVENOUS
  Filled 2016-09-16 (×10): qty 50
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
## 2016-09-16 NOTE — Progress Notes (Signed)
Sputum sample collected and sent to lab per MD order. Pt stable throughout with no complications. RT will continue to monitor.

## 2016-09-17 ENCOUNTER — Inpatient Hospital Stay (HOSPITAL_COMMUNITY): Payer: Medicaid Other

## 2016-09-17 DIAGNOSIS — I471 Supraventricular tachycardia: Secondary | ICD-10-CM

## 2016-09-17 DIAGNOSIS — I272 Pulmonary hypertension, unspecified: Secondary | ICD-10-CM

## 2016-09-17 LAB — CBC
HCT: 27.1 % — ABNORMAL LOW (ref 36.0–46.0)
Hemoglobin: 8.5 g/dL — ABNORMAL LOW (ref 12.0–15.0)
MCH: 29.2 pg (ref 26.0–34.0)
MCHC: 31.4 g/dL (ref 30.0–36.0)
MCV: 93.1 fL (ref 78.0–100.0)
PLATELETS: 148 10*3/uL — AB (ref 150–400)
RBC: 2.91 MIL/uL — AB (ref 3.87–5.11)
RDW: 19.4 % — ABNORMAL HIGH (ref 11.5–15.5)
WBC: 10 10*3/uL (ref 4.0–10.5)

## 2016-09-17 LAB — BLOOD GAS, ARTERIAL
Acid-Base Excess: 1.3 mmol/L (ref 0.0–2.0)
Bicarbonate: 25.5 mmol/L (ref 20.0–28.0)
DRAWN BY: 31394
FIO2: 40
LHR: 20 {breaths}/min
O2 Saturation: 88.7 %
PATIENT TEMPERATURE: 100.4
PEEP/CPAP: 5 cmH2O
PH ART: 7.396 (ref 7.350–7.450)
VT: 530 mL
pCO2 arterial: 43 mmHg (ref 32.0–48.0)
pO2, Arterial: 66.5 mmHg — ABNORMAL LOW (ref 83.0–108.0)

## 2016-09-17 LAB — BLOOD CULTURE ID PANEL (REFLEXED)
Acinetobacter baumannii: NOT DETECTED
CANDIDA GLABRATA: NOT DETECTED
CANDIDA KRUSEI: NOT DETECTED
CANDIDA PARAPSILOSIS: NOT DETECTED
Candida albicans: NOT DETECTED
Candida tropicalis: NOT DETECTED
ENTEROBACTER CLOACAE COMPLEX: NOT DETECTED
ENTEROCOCCUS SPECIES: NOT DETECTED
ESCHERICHIA COLI: NOT DETECTED
Enterobacteriaceae species: NOT DETECTED
Haemophilus influenzae: NOT DETECTED
KLEBSIELLA OXYTOCA: NOT DETECTED
KLEBSIELLA PNEUMONIAE: NOT DETECTED
LISTERIA MONOCYTOGENES: NOT DETECTED
Methicillin resistance: NOT DETECTED
Neisseria meningitidis: NOT DETECTED
PROTEUS SPECIES: NOT DETECTED
Pseudomonas aeruginosa: NOT DETECTED
SERRATIA MARCESCENS: NOT DETECTED
STAPHYLOCOCCUS AUREUS BCID: NOT DETECTED
STAPHYLOCOCCUS SPECIES: DETECTED — AB
STREPTOCOCCUS AGALACTIAE: NOT DETECTED
Streptococcus pneumoniae: NOT DETECTED
Streptococcus pyogenes: NOT DETECTED
Streptococcus species: NOT DETECTED

## 2016-09-17 LAB — GLUCOSE, CAPILLARY
GLUCOSE-CAPILLARY: 122 mg/dL — AB (ref 65–99)
Glucose-Capillary: 88 mg/dL (ref 65–99)
Glucose-Capillary: 145 mg/dL — ABNORMAL HIGH (ref 65–99)
Glucose-Capillary: 108 mg/dL — ABNORMAL HIGH (ref 65–99)
Glucose-Capillary: 118 mg/dL — ABNORMAL HIGH (ref 65–99)

## 2016-09-17 LAB — BASIC METABOLIC PANEL
Anion gap: 9 (ref 5–15)
BUN: 26 mg/dL — AB (ref 6–20)
CO2: 25 mmol/L (ref 22–32)
Calcium: 7.9 mg/dL — ABNORMAL LOW (ref 8.9–10.3)
Chloride: 121 mmol/L — ABNORMAL HIGH (ref 101–111)
Creatinine, Ser: 0.89 mg/dL (ref 0.44–1.00)
GFR calc Af Amer: >60 mL/min (ref >60)
Glucose, Bld: 117 mg/dL — ABNORMAL HIGH (ref 65–99)
POTASSIUM: 3.3 mmol/L — AB (ref 3.5–5.1)
SODIUM: 155 mmol/L — AB (ref 135–145)

## 2016-09-17 MED ORDER — VECURONIUM BROMIDE 10 MG IV SOLR
INTRAVENOUS | Status: AC
  Administered 2016-09-17: 10 mg via INTRAVENOUS
  Filled 2016-09-17: qty 10

## 2016-09-17 MED ORDER — ALBUTEROL SULFATE (2.5 MG/3ML) 0.083% IN NEBU
INHALATION_SOLUTION | RESPIRATORY_TRACT | Status: AC
  Administered 2016-09-17: 2.5 mg via RESPIRATORY_TRACT
  Filled 2016-09-17: qty 3

## 2016-09-17 MED ORDER — VECURONIUM BROMIDE 10 MG IV SOLR
10.0000 mg | Freq: Once | INTRAVENOUS | Status: AC
  Administered 2016-09-17: 10 mg via INTRAVENOUS

## 2016-09-17 MED ORDER — VANCOMYCIN HCL IN DEXTROSE 750-5 MG/150ML-% IV SOLN
750.0000 mg | Freq: Two times a day (BID) | INTRAVENOUS | Status: DC
  Filled 2016-09-17: qty 150

## 2016-09-17 MED ORDER — ALBUTEROL SULFATE (2.5 MG/3ML) 0.083% IN NEBU
2.5000 mg | INHALATION_SOLUTION | RESPIRATORY_TRACT | Status: DC | PRN
  Administered 2016-09-17: 2.5 mg via RESPIRATORY_TRACT

## 2016-09-17 MED ORDER — VANCOMYCIN HCL 10 G IV SOLR
2000.0000 mg | Freq: Once | INTRAVENOUS | Status: AC
  Administered 2016-09-17: 2000 mg via INTRAVENOUS
  Filled 2016-09-17: qty 2000

## 2016-09-17 MED ORDER — VANCOMYCIN HCL IN DEXTROSE 750-5 MG/150ML-% IV SOLN
750.0000 mg | Freq: Two times a day (BID) | INTRAVENOUS | Status: DC
  Administered 2016-09-18 (×2): 750 mg via INTRAVENOUS
  Filled 2016-09-17 (×3): qty 150

## 2016-09-17 MED ORDER — FREE WATER
300.0000 mL | Status: DC
  Administered 2016-09-17 – 2016-09-20 (×16): 300 mL

## 2016-09-17 MED ORDER — POTASSIUM CHLORIDE 20 MEQ/15ML (10%) PO SOLN
40.0000 meq | Freq: Two times a day (BID) | ORAL | Status: AC
  Administered 2016-09-17 (×2): 40 meq via ORAL
  Filled 2016-09-17 (×2): qty 30

## 2016-09-17 NOTE — Progress Notes (Signed)
PHARMACY - PHYSICIAN COMMUNICATION CRITICAL VALUE ALERT - BLOOD CULTURE IDENTIFICATION (BCID)  Results for orders placed or performed during the hospital encounter of 09/11/16  Blood Culture ID Panel (Reflexed) (Collected: 09/16/2016  8:29 AM)  Result Value Ref Range   Enterococcus species NOT DETECTED NOT DETECTED   Listeria monocytogenes NOT DETECTED NOT DETECTED   Staphylococcus species DETECTED (A) NOT DETECTED   Staphylococcus aureus NOT DETECTED NOT DETECTED   Methicillin resistance NOT DETECTED NOT DETECTED   Streptococcus species NOT DETECTED NOT DETECTED   Streptococcus agalactiae NOT DETECTED NOT DETECTED   Streptococcus pneumoniae NOT DETECTED NOT DETECTED   Streptococcus pyogenes NOT DETECTED NOT DETECTED   Acinetobacter baumannii NOT DETECTED NOT DETECTED   Enterobacteriaceae species NOT DETECTED NOT DETECTED   Enterobacter cloacae complex NOT DETECTED NOT DETECTED   Escherichia coli NOT DETECTED NOT DETECTED   Klebsiella oxytoca NOT DETECTED NOT DETECTED   Klebsiella pneumoniae NOT DETECTED NOT DETECTED   Proteus species NOT DETECTED NOT DETECTED   Serratia marcescens NOT DETECTED NOT DETECTED   Haemophilus influenzae NOT DETECTED NOT DETECTED   Neisseria meningitidis NOT DETECTED NOT DETECTED   Pseudomonas aeruginosa NOT DETECTED NOT DETECTED   Candida albicans NOT DETECTED NOT DETECTED   Candida glabrata NOT DETECTED NOT DETECTED   Candida krusei NOT DETECTED NOT DETECTED   Candida parapsilosis NOT DETECTED NOT DETECTED   Candida tropicalis NOT DETECTED NOT DETECTED    Name of physician (or Provider) Contacted: Thompson  Changes to prescribed antibiotics required: Vancomycin started earlier today for GPC in the blood but since it has returned as staph and not staph aureus, zosyn (which she is already on) should cover. So will stop vancomycin and continue zosyn alone.   Kamica Florance, [name] 09/17/2016  11:38 AM
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
## 2016-09-17 NOTE — Progress Notes (Signed)
Patient ID: Jacqueline [name], female   DOB: Aug 15, 1966, 50 y.o.   MRN: [id number] I met with her mother and son and discussed her current plan of care. I discussed trach/PEG including the procedures, risks, and benefits. They want to proceed with that and I will schedule for tomorrow.  Patient examined and I agree with the assessment and plan  Georganna Skeans, MD, MPH, FACS Trauma: [phone] General Surgery: [phone]  09/17/2016 11:00 AM

## 2016-09-18 ENCOUNTER — Inpatient Hospital Stay (HOSPITAL_COMMUNITY): Payer: Medicaid Other

## 2016-09-18 ENCOUNTER — Encounter (HOSPITAL_COMMUNITY): Admission: EM | Disposition: A | Discharge status: Rehabilitation Facility | Payer: Self-pay | Source: Home / Self Care

## 2016-09-18 ENCOUNTER — Encounter (HOSPITAL_COMMUNITY): Payer: Self-pay | Admitting: Certified Registered"

## 2016-09-18 ENCOUNTER — Encounter (HOSPITAL_COMMUNITY): Payer: Self-pay | Admitting: General Surgery

## 2016-09-18 HISTORY — PX: PERCUTANEOUS TRACHEOSTOMY: SHX5288

## 2016-09-18 HISTORY — PX: ESOPHAGOGASTRODUODENOSCOPY: SHX5428

## 2016-09-18 LAB — CULTURE, RESPIRATORY W GRAM STAIN

## 2016-09-18 LAB — BASIC METABOLIC PANEL
Anion gap: 6 (ref 5–15)
BUN: 28 mg/dL — AB (ref 6–20)
CO2: 27 mmol/L (ref 22–32)
Calcium: 7.3 mg/dL — ABNORMAL LOW (ref 8.9–10.3)
Chloride: 121 mmol/L — ABNORMAL HIGH (ref 101–111)
Creatinine, Ser: 0.99 mg/dL (ref 0.44–1.00)
Glucose, Bld: 81 mg/dL (ref 65–99)
POTASSIUM: 3.9 mmol/L (ref 3.5–5.1)
SODIUM: 154 mmol/L — AB (ref 135–145)

## 2016-09-18 LAB — GLUCOSE, CAPILLARY
GLUCOSE-CAPILLARY: 89 mg/dL (ref 65–99)
GLUCOSE-CAPILLARY: 95 mg/dL (ref 65–99)
Glucose-Capillary: 110 mg/dL — ABNORMAL HIGH (ref 65–99)
Glucose-Capillary: 116 mg/dL — ABNORMAL HIGH (ref 65–99)
Glucose-Capillary: 82 mg/dL (ref 65–99)
Glucose-Capillary: 88 mg/dL (ref 65–99)

## 2016-09-18 LAB — CULTURE, RESPIRATORY: SPECIAL REQUESTS: NORMAL

## 2016-09-18 LAB — CBC
HCT: 24.1 % — ABNORMAL LOW (ref 36.0–46.0)
Hemoglobin: 7.4 g/dL — ABNORMAL LOW (ref 12.0–15.0)
MCH: 29.4 pg (ref 26.0–34.0)
MCHC: 30.7 g/dL (ref 30.0–36.0)
MCV: 95.6 fL (ref 78.0–100.0)
PLATELETS: 160 10*3/uL (ref 150–400)
RBC: 2.52 MIL/uL — AB (ref 3.87–5.11)
RDW: 19.9 % — ABNORMAL HIGH (ref 11.5–15.5)
WBC: 12 10*3/uL — AB (ref 4.0–10.5)

## 2016-09-18 LAB — URINE CULTURE: SPECIAL REQUESTS: NORMAL

## 2016-09-18 LAB — TRIGLYCERIDES: TRIGLYCERIDES: 162 mg/dL — AB (ref <150)

## 2016-09-18 SURGERY — EGD (ESOPHAGOGASTRODUODENOSCOPY)

## 2016-09-18 SURGERY — CREATION, TRACHEOSTOMY, PERCUTANEOUS
Anesthesia: General

## 2016-09-18 MED ORDER — GADOBENATE DIMEGLUMINE 529 MG/ML IV SOLN
20.0000 mL | Freq: Once | INTRAVENOUS | Status: AC
  Administered 2016-09-18: 20 mL via INTRAVENOUS

## 2016-09-18 MED ORDER — PIVOT 1.5 CAL PO LIQD
1000.0000 mL | ORAL | Status: DC
  Administered 2016-09-18 – 2016-09-20 (×4): 1000 mL
  Filled 2016-09-18: qty 1000

## 2016-09-18 MED ORDER — FENTANYL BOLUS VIA INFUSION
50.0000 ug | INTRAVENOUS | Status: DC | PRN
  Administered 2016-09-23 (×2): 50 ug via INTRAVENOUS
  Filled 2016-09-18: qty 50

## 2016-09-18 MED ORDER — CEFAZOLIN SODIUM-DEXTROSE 2-4 GM/100ML-% IV SOLN
2.0000 g | Freq: Three times a day (TID) | INTRAVENOUS | Status: DC
  Administered 2016-09-18 – 2016-09-22 (×11): 2 g via INTRAVENOUS
  Filled 2016-09-18 (×14): qty 100

## 2016-09-18 MED ORDER — SODIUM CHLORIDE 0.9 % IR SOLN
Status: DC | PRN
  Administered 2016-09-18: 10 mL

## 2016-09-18 MED ORDER — MIDAZOLAM HCL 2 MG/2ML IJ SOLN
5.0000 mg | Freq: Once | INTRAMUSCULAR | Status: AC
  Administered 2016-09-18: 4 mg via INTRAVENOUS
  Filled 2016-09-18: qty 6

## 2016-09-18 MED ORDER — VECURONIUM BROMIDE 10 MG IV SOLR
10.0000 mg | Freq: Once | INTRAVENOUS | Status: AC
  Administered 2016-09-18: 10 mg via INTRAVENOUS
  Filled 2016-09-18: qty 10

## 2016-09-18 MED ORDER — QUETIAPINE FUMARATE 25 MG PO TABS
50.0000 mg | ORAL_TABLET | Freq: Two times a day (BID) | ORAL | Status: DC
  Administered 2016-09-18 – 2016-09-19 (×4): 50 mg
  Filled 2016-09-18 (×4): qty 2

## 2016-09-18 MED ORDER — WHITE PETROLATUM GEL
Status: AC
  Administered 2016-09-18: 03:00:00
  Filled 2016-09-18: qty 1

## 2016-09-18 MED ORDER — FENTANYL CITRATE (PF) 100 MCG/2ML IJ SOLN: 50.0000 ug | Freq: Once | INTRAMUSCULAR | Status: DC

## 2016-09-18 MED ORDER — CLONAZEPAM 0.5 MG PO TABS
0.5000 mg | ORAL_TABLET | Freq: Two times a day (BID) | ORAL | Status: DC
  Administered 2016-09-18 – 2016-09-19 (×4): 0.5 mg
  Filled 2016-09-18 (×4): qty 1

## 2016-09-18 MED ORDER — LIDOCAINE-EPINEPHRINE (PF) 1.5 %-1:200000 IJ SOLN
INTRAMUSCULAR | Status: DC | PRN
  Administered 2016-09-18: 3 mL

## 2016-09-18 MED ORDER — FENTANYL CITRATE (PF) 2500 MCG/50ML IJ SOLN
25.0000 ug/h | INTRAMUSCULAR | Status: DC
  Administered 2016-09-18 – 2016-09-19 (×4): 300 ug/h via INTRAVENOUS
  Administered 2016-09-20 (×2): 250 ug/h via INTRAVENOUS
  Administered 2016-09-21 (×2): 200 ug/h via INTRAVENOUS
  Administered 2016-09-22: 150 ug/h via INTRAVENOUS
  Administered 2016-09-22: 200 ug/h via INTRAVENOUS
  Administered 2016-09-23 (×2): 250 ug/h via INTRAVENOUS
  Administered 2016-09-23 – 2016-09-24 (×2): 200 ug/h via INTRAVENOUS
  Filled 2016-09-18 (×18): qty 50

## 2016-09-18 SURGICAL SUPPLY — 28 items
DRAPE PROXIMA HALF (DRAPES) ×3 IMPLANT
DRAPE UTILITY XL STRL (DRAPES) ×3 IMPLANT
ELECT CAUTERY BLADE 6.4 (BLADE) ×3 IMPLANT
ELECT REM PT RETURN 9FT ADLT (ELECTROSURGICAL) ×3
ELECTRODE REM PT RTRN 9FT ADLT (ELECTROSURGICAL) ×1 IMPLANT
GAUZE SPONGE 4X4 16PLY XRAY LF (GAUZE/BANDAGES/DRESSINGS) ×3 IMPLANT
GLOVE BIO SURGEON STRL SZ 6 (GLOVE) IMPLANT
GLOVE BIO SURGEON STRL SZ8 (GLOVE) ×6 IMPLANT
GLOVE BIOGEL PI IND STRL 6.5 (GLOVE) IMPLANT
GLOVE BIOGEL PI IND STRL 8 (GLOVE) ×2 IMPLANT
GLOVE BIOGEL PI INDICATOR 6.5 (GLOVE)
GLOVE BIOGEL PI INDICATOR 8 (GLOVE) ×4
GOWN STRL REUS W/ TWL LRG LVL3 (GOWN DISPOSABLE) ×1 IMPLANT
GOWN STRL REUS W/ TWL XL LVL3 (GOWN DISPOSABLE) ×2 IMPLANT
GOWN STRL REUS W/TWL LRG LVL3 (GOWN DISPOSABLE) ×2
GOWN STRL REUS W/TWL XL LVL3 (GOWN DISPOSABLE) ×4
INTRODUCER TRACH BLUE RHINO 6F (TUBING) ×3 IMPLANT
INTRODUCER TRACH BLUE RHINO 8F (TUBING) IMPLANT
PENCIL BUTTON HOLSTER BLD 10FT (ELECTRODE) ×3 IMPLANT
SPONGE DRAIN TRACH 4X4 STRL 2S (GAUZE/BANDAGES/DRESSINGS) ×3 IMPLANT
SPONGE INTESTINAL PEANUT (DISPOSABLE) ×3 IMPLANT
SUT SILK 3 0 SH CR/8 (SUTURE) ×3 IMPLANT
SUT VICRYL AB 3 0 TIES (SUTURE) ×3 IMPLANT
TOWEL OR 17X24 6PK STRL BLUE (TOWEL DISPOSABLE) ×3 IMPLANT
TOWEL OR 17X26 10 PK STRL BLUE (TOWEL DISPOSABLE) IMPLANT
TUBE CONNECTING 12'X1/4 (SUCTIONS) ×1
TUBE CONNECTING 12X1/4 (SUCTIONS) ×2 IMPLANT
YANKAUER SUCT BULB TIP NO VENT (SUCTIONS) ×3 IMPLANT
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
## 2016-09-18 NOTE — Op Note (Signed)
[facility] Patient Name: Jacqueline [name] Procedure Date : 09/18/2016 MRN: [id number] Attending MD: Georganna Skeans , MD Date of Birth: 12-28-1965 CSN: [id number] Age: 50 Admit Type: Inpatient Procedure:                Upper GI endoscopy Indications:              Place PEG because patient is unable to eat due to                            intracranial injury Providers:                Georganna Skeans, MD Referring MD:              Medicines:                Fentanyl  micrograms IV Complications:             Estimated Blood Loss:     Estimated blood loss: none. Procedure:                Pre-Anesthesia Assessment:                           - The risks and benefits of the procedure and the                            sedation options and risks were discussed with the                            patient. All questions were answered and informed                            consent was obtained.                           - Patient identification and proposed procedure                            were verified prior to the procedure by the                            physician, the nurse and the technician. The                            procedure was verified in the procedure room.                           After obtaining informed consent, the endoscope was                            passed under direct vision. Throughout the                            procedure, the patient's blood pressure, pulse, and                            oxygen saturations were monitored  continuously.The                            procedure was aborted due to previous surgery. The                            [id number]) scope was introduced through the                            mouth, and advanced to the body of the stomach. Findings:      The esophagus was normal.      The stomach pouch is S/P gastric bypass.      The duodenum was not examined. Impression:               - We were unable to get  a poke spot for the PEG in                            the gastric pouch. The procedure was aborted due to                            previous gastric bypass surgery. Recommendation:           [employer] Procedure Code(s):        --- Professional ---                           [phone], 52, Esophagogastroduodenoscopy, flexible,                            transoral; diagnostic, including collection of                            specimen(s) by brushing or washing, when performed                            (separate procedure) Diagnosis Code(s):        --- Professional ---                           Z53.8, Procedure and treatment not carried out for                            other reasons CPT copyright 2016 [employer]. All rights reserved. The codes documented in this report are preliminary and upon coder review may  be revised to meet current compliance requirements. Georganna Skeans, MD 09/18/2016 12:06:38 PM This report has been signed electronically. Number of Addenda: 0
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
## 2016-09-18 NOTE — Progress Notes (Signed)
Patient ID: Jacqueline [name], female   DOB: 09-Apr-1966, 50 y.o.   MRN: [id number] I spoke with her mother at the bedside. Georganna Skeans, MD, MPH, FACS Trauma: [phone] General Surgery: [phone]

## 2016-09-18 NOTE — Procedures (Signed)
Pt transported to CT then back to [id number] without complications.  CVP line/setup changed without complications.

## 2016-09-18 NOTE — Op Note (Signed)
09/18/2016  10:57 AM  PATIENT:  Jacqueline [name]  50 y.o. female  PRE-OPERATIVE DIAGNOSIS:  ACUTE RESPIRATORY FAILURE  POST-OPERATIVE DIAGNOSIS:  same  PROCEDURE:  Procedure(s): PERCUTANEOUS TRACHEOSTOMY #6 SHILEY  SURGEON:  Surgeon(s): Georganna Skeans, MD  ASSISTANTS: Silvestre Gunner, [facility]   ANESTHESIA:   local and IV sedation  EBL:  Total I/O In: 741.6 [I.V.:531.6; IV Piggyback:210] Out: 500 [Urine:500]  BLOOD ADMINISTERED:none  DRAINS: none   SPECIMEN:  No Specimen  DISPOSITION OF SPECIMEN:  N/A  COUNTS:  YES  DICTATION: .Dragon Dictation We are proceeding with tracheostomy and Jacqueline [name]. Informed consent was obtained from the family. She is on IV antibiotics. She was given intravenous sedation, pain medication, and muscle relaxation and is monitored in the trauma neuro intensive care unit. The anterior portion of her cervical collar was removed and her head was immobilized. Anterior neck was prepped and draped in sterile fashion. We did time out procedure. Local anesthetic was injected 2 cm cephalad to the sternal notch. Transverse incision was made. Subcutaneous tissues were dissected down through the platysma revealing the strap muscles. We divided dose along the midline. She had a anterior jugular vein on the right side which was divided and ligated with good hemostasis. Continued dissection allowed exposure of the anterior surface of the trachea. We used a tracheal hook. Hemostasis was ensured in the area. The [employer] was used. Angiocath was inserted between the second and third tracheal ring followed by the guidewire. Small Blue Rhino dilator was placed then the large Blue Rhino dilator was placed. Next, a #6 Shiley tracheostomy tube was placed over a 24 [place] dilator. This was hooked up to the ventilator circuit and good volume returns were obtained. Tracheostomy tube was sutured in place. Velcro trach tie was applied. There was good hemostasis. She tolerated the  procedure without any apparent complication. We will check a chest x-ray. PATIENT DISPOSITION:  ICU, on vent   Delay start of Pharmacological VTE agent (>24hrs) due to surgical blood loss or risk of bleeding:  no  Georganna Skeans, MD, MPH, FACS Pager: [phone]  12/12/201710:57 AM

## 2016-09-18 NOTE — Progress Notes (Signed)
I have reviewed the MRI /MRV personally, and have discussed the results with neuroradiology. It does not appear that there is thrombus within the distal Superior sagittal sinus or the right transverse sinus. At this point, we will therefore not start anticoagulation.

## 2016-09-19 ENCOUNTER — Inpatient Hospital Stay (HOSPITAL_COMMUNITY): Payer: Medicaid Other

## 2016-09-19 ENCOUNTER — Encounter (HOSPITAL_COMMUNITY): Payer: Self-pay | Admitting: General Surgery

## 2016-09-19 LAB — CBC
HCT: 24.2 % — ABNORMAL LOW (ref 36.0–46.0)
Hemoglobin: 7.5 g/dL — ABNORMAL LOW (ref 12.0–15.0)
MCH: 29.8 pg (ref 26.0–34.0)
MCHC: 31 g/dL (ref 30.0–36.0)
MCV: 96 fL (ref 78.0–100.0)
PLATELETS: 194 10*3/uL (ref 150–400)
RBC: 2.52 MIL/uL — AB (ref 3.87–5.11)
RDW: 19.7 % — ABNORMAL HIGH (ref 11.5–15.5)
WBC: 13.7 10*3/uL — AB (ref 4.0–10.5)

## 2016-09-19 LAB — CULTURE, BLOOD (ROUTINE X 2)

## 2016-09-19 LAB — GLUCOSE, CAPILLARY
GLUCOSE-CAPILLARY: 75 mg/dL (ref 65–99)
GLUCOSE-CAPILLARY: 85 mg/dL (ref 65–99)
GLUCOSE-CAPILLARY: 88 mg/dL (ref 65–99)
GLUCOSE-CAPILLARY: 85 mg/dL (ref 65–99)
Glucose-Capillary: 116 mg/dL — ABNORMAL HIGH (ref 65–99)
Glucose-Capillary: 87 mg/dL (ref 65–99)

## 2016-09-19 LAB — BASIC METABOLIC PANEL
ANION GAP: 6 (ref 5–15)
BUN: 22 mg/dL — AB (ref 6–20)
CO2: 24 mmol/L (ref 22–32)
Calcium: 7.7 mg/dL — ABNORMAL LOW (ref 8.9–10.3)
Chloride: 117 mmol/L — ABNORMAL HIGH (ref 101–111)
Creatinine, Ser: 0.87 mg/dL (ref 0.44–1.00)
GFR calc Af Amer: >60 mL/min (ref >60)
Glucose, Bld: 98 mg/dL (ref 65–99)
POTASSIUM: 3.2 mmol/L — AB (ref 3.5–5.1)
SODIUM: 147 mmol/L — AB (ref 135–145)

## 2016-09-19 LAB — MRSA PCR SCREENING: MRSA BY PCR: NEGATIVE

## 2016-09-19 MED ORDER — SODIUM CHLORIDE 0.9 % IV SOLN
30.0000 meq | Freq: Once | INTRAVENOUS | Status: AC
  Administered 2016-09-19: 30 meq via INTRAVENOUS
  Filled 2016-09-19: qty 15

## 2016-09-19 MED ORDER — POTASSIUM CHLORIDE 20 MEQ/15ML (10%) PO SOLN
20.0000 meq | Freq: Two times a day (BID) | ORAL | Status: DC
  Administered 2016-09-19 (×2): 20 meq
  Filled 2016-09-19 (×3): qty 15

## 2016-09-19 MED ORDER — ENOXAPARIN SODIUM 40 MG/0.4ML ~~LOC~~ SOLN
40.0000 mg | SUBCUTANEOUS | Status: DC
  Administered 2016-09-19 – 2016-09-28 (×10): 40 mg via SUBCUTANEOUS
  Filled 2016-09-19 (×10): qty 0.4

## 2016-09-19 NOTE — Progress Notes (Signed)
Patient ID: Jacqueline [name], female   DOB: [birth year], 50 y.o.   MRN: [id number] I spoke with her mother and brother at the bedside and updated them on her progress. Georganna Skeans, MD, MPH, FACS Trauma: [phone] General Surgery: [phone]
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
## 2016-09-19 NOTE — Progress Notes (Signed)
Pt placed back on full vent support and FIO2 increased to 50% due to increased WOB and desat.  Sats recovered.
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
## 2016-09-20 ENCOUNTER — Inpatient Hospital Stay (HOSPITAL_COMMUNITY): Payer: Medicaid Other

## 2016-09-20 LAB — CBC
HEMATOCRIT: 24.5 % — AB (ref 36.0–46.0)
HEMOGLOBIN: 7.7 g/dL — AB (ref 12.0–15.0)
MCH: 29.4 pg (ref 26.0–34.0)
MCHC: 31.4 g/dL (ref 30.0–36.0)
MCV: 93.5 fL (ref 78.0–100.0)
Platelets: 257 10*3/uL (ref 150–400)
RBC: 2.62 MIL/uL — AB (ref 3.87–5.11)
RDW: 19.1 % — ABNORMAL HIGH (ref 11.5–15.5)
WBC: 13.6 10*3/uL — ABNORMAL HIGH (ref 4.0–10.5)

## 2016-09-20 LAB — GLUCOSE, CAPILLARY
GLUCOSE-CAPILLARY: 103 mg/dL — AB (ref 65–99)
GLUCOSE-CAPILLARY: 107 mg/dL — AB (ref 65–99)
GLUCOSE-CAPILLARY: 91 mg/dL (ref 65–99)
GLUCOSE-CAPILLARY: 78 mg/dL (ref 65–99)
Glucose-Capillary: 86 mg/dL (ref 65–99)

## 2016-09-20 LAB — BASIC METABOLIC PANEL
Anion gap: 10 (ref 5–15)
BUN: 22 mg/dL — AB (ref 6–20)
CHLORIDE: 109 mmol/L (ref 101–111)
CO2: 24 mmol/L (ref 22–32)
Calcium: 7.5 mg/dL — ABNORMAL LOW (ref 8.9–10.3)
Creatinine, Ser: 0.83 mg/dL (ref 0.44–1.00)
GFR calc Af Amer: >60 mL/min (ref >60)
GFR calc non Af Amer: >60 mL/min (ref >60)
Glucose, Bld: 147 mg/dL — ABNORMAL HIGH (ref 65–99)
POTASSIUM: 3.1 mmol/L — AB (ref 3.5–5.1)
SODIUM: 143 mmol/L (ref 135–145)

## 2016-09-20 MED ORDER — POTASSIUM CHLORIDE 20 MEQ/15ML (10%) PO SOLN
40.0000 meq | Freq: Once | ORAL | Status: AC
  Administered 2016-09-20: 40 meq via ORAL
  Filled 2016-09-20: qty 30

## 2016-09-20 MED ORDER — SODIUM CHLORIDE 0.9 % IV SOLN
30.0000 meq | Freq: Once | INTRAVENOUS | Status: AC
  Administered 2016-09-20: 30 meq via INTRAVENOUS
  Filled 2016-09-20: qty 15

## 2016-09-20 MED ORDER — FREE WATER
200.0000 mL | Status: DC
  Administered 2016-09-20 – 2016-09-21 (×6): 200 mL

## 2016-09-20 MED ORDER — QUETIAPINE FUMARATE 100 MG PO TABS
100.0000 mg | ORAL_TABLET | Freq: Two times a day (BID) | ORAL | Status: DC
  Administered 2016-09-20 – 2016-09-24 (×8): 100 mg
  Filled 2016-09-20 (×8): qty 1

## 2016-09-20 MED ORDER — POTASSIUM CHLORIDE 20 MEQ/15ML (10%) PO SOLN
60.0000 meq | Freq: Once | ORAL | Status: AC
  Administered 2016-09-20: 60 meq via ORAL
  Filled 2016-09-20: qty 45

## 2016-09-20 MED ORDER — CLONAZEPAM 1 MG PO TABS
1.0000 mg | ORAL_TABLET | Freq: Two times a day (BID) | ORAL | Status: DC
  Administered 2016-09-20 – 2016-09-24 (×8): 1 mg
  Filled 2016-09-20 (×8): qty 1

## 2016-09-20 MED ORDER — POTASSIUM CHLORIDE 20 MEQ/15ML (10%) PO SOLN
40.0000 meq | Freq: Two times a day (BID) | ORAL | Status: AC
  Administered 2016-09-20: 40 meq via ORAL
  Filled 2016-09-20 (×2): qty 30
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
## 2016-09-21 LAB — CBC
HCT: 28.4 % — ABNORMAL LOW (ref 36.0–46.0)
Hemoglobin: 8.7 g/dL — ABNORMAL LOW (ref 12.0–15.0)
MCH: 29.3 pg (ref 26.0–34.0)
MCHC: 30.6 g/dL (ref 30.0–36.0)
MCV: 95.6 fL (ref 78.0–100.0)
Platelets: 241 K/uL (ref 150–400)
RBC: 2.97 MIL/uL — ABNORMAL LOW (ref 3.87–5.11)
RDW: 19.2 % — ABNORMAL HIGH (ref 11.5–15.5)
WBC: 10.6 K/uL — ABNORMAL HIGH (ref 4.0–10.5)

## 2016-09-21 LAB — BASIC METABOLIC PANEL WITH GFR
Anion gap: 4 — ABNORMAL LOW (ref 5–15)
BUN: 18 mg/dL (ref 6–20)
CO2: 25 mmol/L (ref 22–32)
Calcium: 7.6 mg/dL — ABNORMAL LOW (ref 8.9–10.3)
Chloride: 108 mmol/L (ref 101–111)
Creatinine, Ser: 0.74 mg/dL (ref 0.44–1.00)
GFR calc Af Amer: >60 mL/min
GFR calc non Af Amer: >60 mL/min
Glucose, Bld: 98 mg/dL (ref 65–99)
Potassium: 3.2 mmol/L — ABNORMAL LOW (ref 3.5–5.1)
Sodium: 137 mmol/L (ref 135–145)

## 2016-09-21 LAB — GLUCOSE, CAPILLARY
GLUCOSE-CAPILLARY: 83 mg/dL (ref 65–99)
GLUCOSE-CAPILLARY: 74 mg/dL (ref 65–99)
GLUCOSE-CAPILLARY: 115 mg/dL — AB (ref 65–99)
GLUCOSE-CAPILLARY: 85 mg/dL (ref 65–99)
Glucose-Capillary: 104 mg/dL — ABNORMAL HIGH (ref 65–99)
Glucose-Capillary: 110 mg/dL — ABNORMAL HIGH (ref 65–99)

## 2016-09-21 LAB — TRIGLYCERIDES: Triglycerides: 250 mg/dL — ABNORMAL HIGH

## 2016-09-21 MED ORDER — POTASSIUM CHLORIDE 20 MEQ/15ML (10%) PO SOLN
40.0000 meq | Freq: Two times a day (BID) | ORAL | Status: AC
  Administered 2016-09-21 (×2): 40 meq via ORAL
  Filled 2016-09-21 (×2): qty 30

## 2016-09-21 MED ORDER — FREE WATER
200.0000 mL | Freq: Three times a day (TID) | Status: DC
  Administered 2016-09-21 – 2016-09-25 (×8): 200 mL

## 2016-09-21 MED ORDER — PIVOT 1.5 CAL PO LIQD
1000.0000 mL | ORAL | Status: DC
  Administered 2016-09-21 – 2016-09-24 (×4): 1000 mL

## 2016-09-21 MED ORDER — POLYETHYLENE GLYCOL 3350 17 G PO PACK
17.0000 g | PACK | Freq: Every day | ORAL | Status: DC
  Administered 2016-09-21 – 2016-09-23 (×2): 17 g
  Filled 2016-09-21 (×2): qty 1

## 2016-09-21 MED ORDER — SODIUM CHLORIDE 0.9 % IV SOLN
30.0000 meq | Freq: Once | INTRAVENOUS | Status: AC
  Administered 2016-09-21: 30 meq via INTRAVENOUS
  Filled 2016-09-21: qty 15
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
## 2016-09-21 NOTE — Progress Notes (Signed)
09/21/2016  [facility] [place] Levels I-VI of Cognitive Functioning-Daily Tracking Sheet         Date of Onset _12/5/17________  Level of function Behavioral Characteristics Initial Eval.  Date: __12/15/17__ Date and initial when observed   Level I No response Total Assistance Complete absence of observable change in behavior when presented visual, auditory, tactile, proprioceptive, vestibular or painful stimuli.             Level II Generalized response  Total Assistance Demonstrates generalized reflex response to painful stimuli       Responds to repeated auditory stimuli with increased or decreased activity       Responds to external stimuli with physiological changes generalized, gross body movement and / or not purposeful vocalization               Responses noted above may be same regardless of type and location of stimuli        Responses may be significantly delayed      Level III Localized response  Total Assistance Demonstrates withdrawal or vocalization to painful stimuli 12/15 RBM      Turns toward or away from auditory stimuli  12/15 RBM      Blinks when strong light crosses visual field        Follows moving object passed within visual field  12/15 RBM      Responds to discomfort by pulling tubes or restraints       Responds inconsistently to simple commands 12/15 RBM      Responses directly related to type of stimulus       May respond to some persons (especially friends and family) but not to others       Level IV Confused/Agitated  Maximal Assistance       Alert and in heightened state of anxiety  12/15 RBM      Purposeful attempts to remove restraints or tubes or crawl out of bed  12/15 RBM       May perform motor activities such as sitting, reaching and walking without any apparent purpose or upon another's request  12/15 RBM      Brief and usually non purposeful moments of focused and sustained attention  09/24/16 RBM      Post traumatic amnesia state        Absent goal directed, problem solving, self monitoring behavior  09/24/16 RBM     May cry or scream out of proportion to stimulus even after it's removal       May exhibit aggressive or flight behavior   09/24/16 RBM     Mood swing from euphoric to hostile with no apparent relationship to environmental events        Verbalizations are frequently incoherent and/or inappropriate to activity or environment    12/19 LP         Level V   Confused/InappropriateNon-Agitated  Maximal Assistance Alert, not agitated but may wander randomly or with vague intention of going home        May become agitated in response to external stimulation and/or lack of environmental structure.    12/21 BD   Not orientated to person, place and time.   12/19 LP 12/21 BD   Frequent brief periods, non-purposeful sustained attention.    12/21 BD   Severely impaired recent memory, with confusion of past and present in reaction to ongoing activity.        Absent goal directed, problem solving behavior.  12/19 LP    Often demonstrates inappropriate use of objects without external direction.    12/21 BD   May be able to perform previously learned tasks when structure and cues provided.   12/19 LP 12/21 BD   Unable to learn new information       Able to respond appropriately to simple commands fairly consistently with external structure and cues.       Responses to simple commands without external structure are random and non-purposeful in relation to the command    12/21 BD   Able to converse on a social, automatic level for brief periods of time when provided external structure and cues.        Verbalizations about present events become inappropriate and confabulatory when external structure and cues are not provided.     12/21 BD           Level of function Level VI Confused/Appropriate  Maximal Assistance Behavioral Characteristics  Initial Eval.  Date: _____ Date and initial when observed    Able to attend to highly familiar tasks in non-distracting environment for 30 minutes with moderate redirection.       Remote memory has more depth and detail than recent memory        Vague recognition of some staff.       Able to use assistive memory aide with maximal assist.       Emerging awareness of appropriate response to self, family and basic needs.        Emerging goal directed behavior.        Moderate assist to problem solve barriers to task completion.        Supervised for old learning (e.g. self care).       Shows carry over for relearned familiar tasks (e.g. self care).       Maximal assistance for new learning with little or no carry over.       Unaware of impairments, disabilities and safety risks.       Consistently follows simple directions       Verbal expressions are appropriate in highly familiar and structured situations.
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
## 2016-09-21 NOTE — Progress Notes (Signed)
Rehab Admissions Coordinator Note:  Patient was screened by Cleatrice Burke for appropriateness for an Inpatient Acute Rehab Consult per PT and OT recommendation.   At this time, we are recommending await for inpt rehab consult pending vent. I will follow.Cleatrice Burke 09/21/2016, 3:22 PM  I can be reached at [phone].
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
## 2016-09-22 ENCOUNTER — Inpatient Hospital Stay (HOSPITAL_COMMUNITY): Payer: Medicaid Other

## 2016-09-22 LAB — GLUCOSE, CAPILLARY
Glucose-Capillary: 107 mg/dL — ABNORMAL HIGH (ref 65–99)
Glucose-Capillary: 109 mg/dL — ABNORMAL HIGH (ref 65–99)
Glucose-Capillary: 111 mg/dL — ABNORMAL HIGH (ref 65–99)
Glucose-Capillary: 80 mg/dL (ref 65–99)
Glucose-Capillary: 89 mg/dL (ref 65–99)
Glucose-Capillary: 92 mg/dL (ref 65–99)

## 2016-09-22 LAB — CBC
HEMATOCRIT: 24.1 % — AB (ref 36.0–46.0)
HEMOGLOBIN: 7.6 g/dL — AB (ref 12.0–15.0)
MCH: 29.6 pg (ref 26.0–34.0)
MCHC: 31.5 g/dL (ref 30.0–36.0)
MCV: 93.8 fL (ref 78.0–100.0)
PLATELETS: 342 10*3/uL (ref 150–400)
RBC: 2.57 MIL/uL — AB (ref 3.87–5.11)
RDW: 18.6 % — ABNORMAL HIGH (ref 11.5–15.5)
WBC: 12.4 10*3/uL — ABNORMAL HIGH (ref 4.0–10.5)

## 2016-09-22 LAB — BASIC METABOLIC PANEL
ANION GAP: 7 (ref 5–15)
BUN: 13 mg/dL (ref 6–20)
CO2: 27 mmol/L (ref 22–32)
Calcium: 8.3 mg/dL — ABNORMAL LOW (ref 8.9–10.3)
Chloride: 107 mmol/L (ref 101–111)
Creatinine, Ser: 0.69 mg/dL (ref 0.44–1.00)
GFR calc Af Amer: >60 mL/min (ref >60)
GFR calc non Af Amer: >60 mL/min (ref >60)
Glucose, Bld: 93 mg/dL (ref 65–99)
Potassium: 3.2 mmol/L — ABNORMAL LOW (ref 3.5–5.1)
Sodium: 141 mmol/L (ref 135–145)

## 2016-09-22 LAB — BLOOD CULTURE ID PANEL (REFLEXED)
ACINETOBACTER BAUMANNII: DETECTED — AB
CANDIDA ALBICANS: NOT DETECTED
CANDIDA GLABRATA: NOT DETECTED
CANDIDA TROPICALIS: NOT DETECTED
Candida krusei: NOT DETECTED
Candida parapsilosis: NOT DETECTED
Carbapenem resistance: NOT DETECTED
ENTEROBACTER CLOACAE COMPLEX: NOT DETECTED
ENTEROBACTERIACEAE SPECIES: NOT DETECTED
Enterococcus species: NOT DETECTED
Escherichia coli: NOT DETECTED
Haemophilus influenzae: NOT DETECTED
Klebsiella oxytoca: NOT DETECTED
Klebsiella pneumoniae: NOT DETECTED
Listeria monocytogenes: NOT DETECTED
NEISSERIA MENINGITIDIS: NOT DETECTED
PROTEUS SPECIES: NOT DETECTED
PSEUDOMONAS AERUGINOSA: NOT DETECTED
STREPTOCOCCUS AGALACTIAE: NOT DETECTED
STREPTOCOCCUS PNEUMONIAE: NOT DETECTED
STREPTOCOCCUS PYOGENES: NOT DETECTED
STREPTOCOCCUS SPECIES: NOT DETECTED
Serratia marcescens: NOT DETECTED
Staphylococcus aureus (BCID): NOT DETECTED
Staphylococcus species: NOT DETECTED

## 2016-09-22 LAB — VITAMIN B12: VITAMIN B 12: 996 pg/mL — AB (ref 180–914)

## 2016-09-22 LAB — PREGNANCY, URINE: PREG TEST UR: NEGATIVE

## 2016-09-22 MED ORDER — MEROPENEM 1 G IV SOLR
1.0000 g | Freq: Three times a day (TID) | INTRAVENOUS | Status: DC
  Administered 2016-09-22 – 2016-09-24 (×6): 1 g via INTRAVENOUS
  Filled 2016-09-22 (×8): qty 1

## 2016-09-22 MED ORDER — SODIUM CHLORIDE 0.9 % IV SOLN
30.0000 meq | Freq: Once | INTRAVENOUS | Status: AC
  Administered 2016-09-22: 30 meq via INTRAVENOUS
  Filled 2016-09-22: qty 15

## 2016-09-22 MED ORDER — ALTEPLASE 2 MG IJ SOLR
2.0000 mg | Freq: Once | INTRAMUSCULAR | Status: AC
  Administered 2016-09-22: 2 mg

## 2016-09-22 MED ORDER — DEXMEDETOMIDINE HCL IN NACL 400 MCG/100ML IV SOLN
0.4000 ug/kg/h | INTRAVENOUS | Status: DC
  Administered 2016-09-22 (×2): 1 ug/kg/h via INTRAVENOUS
  Administered 2016-09-22: 0.6 ug/kg/h via INTRAVENOUS
  Administered 2016-09-22: 0.8 ug/kg/h via INTRAVENOUS
  Administered 2016-09-23 (×5): 1 ug/kg/h via INTRAVENOUS
  Administered 2016-09-23: 1.2 ug/kg/h via INTRAVENOUS
  Administered 2016-09-23 – 2016-09-24 (×2): 1 ug/kg/h via INTRAVENOUS
  Administered 2016-09-24: 1.1 ug/kg/h via INTRAVENOUS
  Administered 2016-09-24: 1 ug/kg/h via INTRAVENOUS
  Administered 2016-09-24 – 2016-09-26 (×12): 1.2 ug/kg/h via INTRAVENOUS
  Administered 2016-09-26: 1 ug/kg/h via INTRAVENOUS
  Administered 2016-09-26: 0.9 ug/kg/h via INTRAVENOUS
  Filled 2016-09-22 (×29): qty 100

## 2016-09-22 MED ORDER — SODIUM CHLORIDE 0.9 % IV SOLN
2.0000 g | Freq: Three times a day (TID) | INTRAVENOUS | Status: DC
  Filled 2016-09-22 (×2): qty 2

## 2016-09-22 MED ORDER — THIAMINE HCL 100 MG/ML IJ SOLN
100.0000 mg | Freq: Every day | INTRAMUSCULAR | Status: DC
Start: 2016-09-22 — End: 2016-09-27
  Administered 2016-09-22 – 2016-09-27 (×6): 100 mg via INTRAVENOUS
  Filled 2016-09-22 (×6): qty 2

## 2016-09-22 NOTE — Progress Notes (Signed)
PHARMACY - PHYSICIAN COMMUNICATION CRITICAL VALUE ALERT - BLOOD CULTURE IDENTIFICATION (BCID)  Results for orders placed or performed during the hospital encounter of 09/11/16  Blood Culture ID Panel (Reflexed) (Collected: 09/21/2016  8:09 AM)  Result Value Ref Range   Enterococcus species NOT DETECTED NOT DETECTED   Listeria monocytogenes NOT DETECTED NOT DETECTED   Staphylococcus species NOT DETECTED NOT DETECTED   Staphylococcus aureus NOT DETECTED NOT DETECTED   Streptococcus species NOT DETECTED NOT DETECTED   Streptococcus agalactiae NOT DETECTED NOT DETECTED   Streptococcus pneumoniae NOT DETECTED NOT DETECTED   Streptococcus pyogenes NOT DETECTED NOT DETECTED   Acinetobacter baumannii DETECTED (A) NOT DETECTED   Enterobacteriaceae species NOT DETECTED NOT DETECTED   Enterobacter cloacae complex NOT DETECTED NOT DETECTED   Escherichia coli NOT DETECTED NOT DETECTED   Klebsiella oxytoca NOT DETECTED NOT DETECTED   Klebsiella pneumoniae NOT DETECTED NOT DETECTED   Proteus species NOT DETECTED NOT DETECTED   Serratia marcescens NOT DETECTED NOT DETECTED   Carbapenem resistance NOT DETECTED NOT DETECTED   Haemophilus influenzae NOT DETECTED NOT DETECTED   Neisseria meningitidis NOT DETECTED NOT DETECTED   Pseudomonas aeruginosa NOT DETECTED NOT DETECTED   Candida albicans NOT DETECTED NOT DETECTED   Candida glabrata NOT DETECTED NOT DETECTED   Candida krusei NOT DETECTED NOT DETECTED   Candida parapsilosis NOT DETECTED NOT DETECTED   Candida tropicalis NOT DETECTED NOT DETECTED    Name of physician (or Provider) Contacted: [place]  Changes to prescribed antibiotics required: Stop Ancef, change to meropenem Meropenem 1g/8h Monitor cultures and susceptibilities    Hughes Better, PharmD, BCPS Clinical Pharmacist 09/22/2016 12:22 PM

## 2016-09-22 NOTE — Progress Notes (Signed)
Patient is awake. She appears aware. She will follow some simple commands bilaterally. She has a tracheostomy in place and is nonverbal. She still has a little bit of intermittent blood-tinged drainage from her right ear but I do not see any frank CSF at present.  Overall stable. Continue current supportive efforts. Possible lumbar drain neck suite if continues to drain CSF

## 2016-09-22 NOTE — Consult Note (Signed)
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 Wound type:Pressure Pressure Ulcer POA: No Measurement: 2cm x 3cm x 0.4cm Wound bed: pink, wet Drainage (amount, consistency, odor) moderate amount of serosanguinous mucous/educate Periwound:intact without evidence of maceration Dressing procedure/placement/frequency: Nursing is provided with guidance via the orders for topical care of this wound using a silicone foam with absorbant core.  Checking and changing tis dressing frequently will prevent maceration and extension of wound.  Consultation with Respiratory Therapy and Trauma are recommended for further suggestions regarding a minor decrease in HOB elevation or other thoughts on accelerating tissue repair and regeneration in the presence of serious illness. [place] nursing team will not follow closely, but will remain available to this patient, the nursing, surgical and medical teams.  Please re-consult if needed in between visits. Thanks, Maudie Flakes, MSN, RN, [place], Arther Abbott  Pager# [phone]
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
## 2016-09-23 DIAGNOSIS — L8990 Pressure ulcer of unspecified site, unspecified stage: Secondary | ICD-10-CM | POA: Insufficient documentation

## 2016-09-23 LAB — CBC
HEMATOCRIT: 22.8 % — AB (ref 36.0–46.0)
Hemoglobin: 7.1 g/dL — ABNORMAL LOW (ref 12.0–15.0)
MCH: 29.3 pg (ref 26.0–34.0)
MCHC: 31.1 g/dL (ref 30.0–36.0)
MCV: 94.2 fL (ref 78.0–100.0)
PLATELETS: 393 10*3/uL (ref 150–400)
RBC: 2.42 MIL/uL — ABNORMAL LOW (ref 3.87–5.11)
RDW: 18.5 % — AB (ref 11.5–15.5)
WBC: 10.8 10*3/uL — ABNORMAL HIGH (ref 4.0–10.5)

## 2016-09-23 LAB — BASIC METABOLIC PANEL
Anion gap: 5 (ref 5–15)
BUN: 16 mg/dL (ref 6–20)
CHLORIDE: 108 mmol/L (ref 101–111)
CO2: 27 mmol/L (ref 22–32)
CREATININE: 0.7 mg/dL (ref 0.44–1.00)
Calcium: 8 mg/dL — ABNORMAL LOW (ref 8.9–10.3)
GFR calc Af Amer: >60 mL/min (ref >60)
GFR calc non Af Amer: >60 mL/min (ref >60)
Glucose, Bld: 110 mg/dL — ABNORMAL HIGH (ref 65–99)
POTASSIUM: 3.1 mmol/L — AB (ref 3.5–5.1)
Sodium: 140 mmol/L (ref 135–145)

## 2016-09-23 LAB — GLUCOSE, CAPILLARY
GLUCOSE-CAPILLARY: 101 mg/dL — AB (ref 65–99)
GLUCOSE-CAPILLARY: 108 mg/dL — AB (ref 65–99)
GLUCOSE-CAPILLARY: 109 mg/dL — AB (ref 65–99)
Glucose-Capillary: 114 mg/dL — ABNORMAL HIGH (ref 65–99)
Glucose-Capillary: 108 mg/dL — ABNORMAL HIGH (ref 65–99)
Glucose-Capillary: 93 mg/dL (ref 65–99)

## 2016-09-23 LAB — CULTURE, BLOOD (ROUTINE X 2)

## 2016-09-23 MED ORDER — SODIUM CHLORIDE 0.9 % IV SOLN: Freq: Once | INTRAVENOUS | Status: DC

## 2016-09-23 MED ORDER — SODIUM CHLORIDE 0.9 % IV SOLN
30.0000 meq | Freq: Once | INTRAVENOUS | Status: AC
  Administered 2016-09-23: 30 meq via INTRAVENOUS
  Filled 2016-09-23: qty 15
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
## 2016-09-24 ENCOUNTER — Inpatient Hospital Stay (HOSPITAL_COMMUNITY): Payer: Medicaid Other

## 2016-09-24 LAB — GLUCOSE, CAPILLARY
GLUCOSE-CAPILLARY: 91 mg/dL (ref 65–99)
GLUCOSE-CAPILLARY: 101 mg/dL — AB (ref 65–99)
GLUCOSE-CAPILLARY: 98 mg/dL (ref 65–99)
Glucose-Capillary: 94 mg/dL (ref 65–99)
Glucose-Capillary: 112 mg/dL — ABNORMAL HIGH (ref 65–99)

## 2016-09-24 LAB — CULTURE, BLOOD (ROUTINE X 2)

## 2016-09-24 LAB — ZINC: Zinc: 38 ug/dL — ABNORMAL LOW (ref 56–134)

## 2016-09-24 LAB — COPPER, SERUM: Copper: 37 ug/dL — ABNORMAL LOW (ref 72–166)

## 2016-09-24 MED ORDER — SODIUM CHLORIDE 0.9 % IV SOLN
3.0000 g | Freq: Four times a day (QID) | INTRAVENOUS | Status: AC
  Administered 2016-09-24 – 2016-10-01 (×26): 3 g via INTRAVENOUS
  Filled 2016-09-24 (×30): qty 3

## 2016-09-24 MED ORDER — PRO-STAT SUGAR FREE PO LIQD
30.0000 mL | Freq: Every day | ORAL | Status: DC
  Administered 2016-09-24: 30 mL
  Filled 2016-09-24: qty 30

## 2016-09-24 MED ORDER — PIVOT 1.5 CAL PO LIQD: 1000.0000 mL | ORAL | Status: DC

## 2016-09-24 NOTE — Progress Notes (Signed)
Patient agitated overnight. Hr dropping to 50s and BP soft, Precidex decreased to 0.5. Patient remains agitated and has poor safety awareness, attempting to get out of bed. Trauma MD paged and notified of situation. Given orders to increase Precidex and place restraints for patient safety. Will place orders and continue to monitor patient.
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
## 2016-09-24 NOTE — Progress Notes (Signed)
Trauma Service Note  Subjective: Patient placed on trach collar this morning.  Has some secretions.  Moving all over the bed.  Will not follow commands, but moving all fours.  Objective: Vital signs in last 24 hours: Temp:  [98.4 F (36.9 C)-99.5 F (37.5 C)] 98.8 F (37.1 C) (12/18 0800) Pulse Rate:  [64-72] 72 (12/18 0755) Resp:  [14-25] 18 (12/18 0755) BP: (78-117)/(41-85) 94/83 (12/18 0800) SpO2:  [94 %-100 %] 98 % (12/18 0800) FiO2 (%):  [40 %] 40 % (12/18 0755) Weight:  [105.6 kg (232 lb 12.9 oz)] 105.6 kg (232 lb 12.9 oz) (12/18 0420) Last BM Date: 09/23/16  Intake/Output from previous day: 12/17 0701 - 12/18 0700 In: 4270.2 [I.V.:2885.2; NG/GT:330; IV Piggyback:1055] Out: [id number] [Urine:4800; Emesis/NG output:150; Stool:4] Intake/Output this shift: Total I/O In: 91.4 [I.V.:91.4] Out: 350 [Urine:350]  General: No acute distress but agitation  Lungs: Clear  Abd: Soft, good bowel sounds.  Feeding tube has been pulled out by the patient since yesterday evening.  To have Cortrak replaced today.  Extremities: No changes  Neuro: Agitated.  GCS 9-10  Lab Results: CBC   Recent Labs  09/22/16 0500 09/23/16 0423  WBC 12.4* 10.8*  HGB 7.6* 7.1*  HCT 24.1* 22.8*  PLT 342 393   BMET  Recent Labs  09/22/16 0500 09/23/16 0423  NA 141 140  K 3.2* 3.1*  CL 107 108  CO2 27 27  GLUCOSE 93 110*  BUN 13 16  CREATININE 0.69 0.70  CALCIUM 8.3* 8.0*   PT/INR No results for input(s): LABPROT, INR in the last 72 hours. ABG No results for input(s): PHART, HCO3 in the last 72 hours.  Invalid input(s): PCO2, PO2  Studies/Results: Dg Abd Portable 1v  Result Date: 09/22/2016 CLINICAL DATA:  Multiple trauma. Previous gastric bypass surgery. Feeding tube placement. EXAM: PORTABLE ABDOMEN - 1 VIEW COMPARISON:  09/18/2016 FINDINGS: Feeding tube is seen extending through the stomach, with tip in the left lower quadrant likely within efferent loop of jejunum distal to  gastric bypass anastomosis. Mild generalized gaseous distention of small and large bowel, consistent with mild ileus. IMPRESSION: Feeding tube tip and left lower quadrant, likely within the the efferent jejunum distal to gastric bypass anastomosis. Probable mild adynamic ileus. Electronically Signed   By: Earle Gell M.D.   On: 09/22/2016 13:41    Anti-infectives: Anti-infectives    Start     Dose/Rate Route Frequency Ordered Stop   09/22/16 1300  meropenem (MERREM) 2 g in sodium chloride 0.9 % 100 mL IVPB  Status:  Discontinued     2 g 200 mL/hr over 30 Minutes Intravenous Every 8 hours 09/22/16 1218 09/22/16 1226   09/22/16 1300  meropenem (MERREM) 1 g in sodium chloride 0.9 % 100 mL IVPB     1 g 200 mL/hr over 30 Minutes Intravenous Every 8 hours 09/22/16 1226     09/18/16 2200  ceFAZolin (ANCEF) IVPB 2g/100 mL premix  Status:  Discontinued     2 g 200 mL/hr over 30 Minutes Intravenous Every 8 hours 09/18/16 1500 09/22/16 1218   09/17/16 2300  vancomycin (VANCOCIN) IVPB 750 mg/150 ml premix  Status:  Discontinued     750 mg 150 mL/hr over 60 Minutes Intravenous Every 12 hours 09/17/16 1544 09/18/16 1500   09/17/16 2200  vancomycin (VANCOCIN) IVPB 750 mg/150 ml premix  Status:  Discontinued     750 mg 150 mL/hr over 60 Minutes Intravenous Every 12 hours 09/17/16 0909 09/17/16 1138  09/17/16 0915  vancomycin (VANCOCIN) 2,000 mg in sodium chloride 0.9 % 500 mL IVPB     2,000 mg 250 mL/hr over 120 Minutes Intravenous  Once 09/17/16 0907 09/17/16 1307   09/16/16 0800  piperacillin-tazobactam (ZOSYN) IVPB 3.375 g  Status:  Discontinued    Comments:  Zosyn 3.375 g IV q8h for CrCl > 10 mL/min   3.375 g 12.5 mL/hr over 240 Minutes Intravenous Every 8 hours 09/16/16 0735 09/18/16 1500      Assessment/Plan: s/p Procedure(s): ESOPHAGOGASTRODUODENOSCOPY (EGD) Replace feeding tube.    Try to keep the patient on trach collar. Restart tube feedings.  LOS: 13 days   Kathryne Eriksson. [name], MD,  FACS [phone] Trauma Surgeon 09/24/2016
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
## 2016-09-24 NOTE — Progress Notes (Signed)
Blood culture + for Acinetobacter.  Will start Unasyn and remove the centra line.  Jacqueline [name]. [name], MD, [place] [phone] Trauma Surgeon

## 2016-09-25 ENCOUNTER — Inpatient Hospital Stay (HOSPITAL_COMMUNITY): Payer: Medicaid Other

## 2016-09-25 DIAGNOSIS — F1910 Other psychoactive substance abuse, uncomplicated: Secondary | ICD-10-CM

## 2016-09-25 DIAGNOSIS — F332 Major depressive disorder, recurrent severe without psychotic features: Secondary | ICD-10-CM

## 2016-09-25 DIAGNOSIS — S069X9A Unspecified intracranial injury with loss of consciousness of unspecified duration, initial encounter: Secondary | ICD-10-CM

## 2016-09-25 DIAGNOSIS — S299XXD Unspecified injury of thorax, subsequent encounter: Secondary | ICD-10-CM

## 2016-09-25 DIAGNOSIS — T1491XA Suicide attempt, initial encounter: Secondary | ICD-10-CM

## 2016-09-25 DIAGNOSIS — Z930 Tracheostomy status: Secondary | ICD-10-CM

## 2016-09-25 DIAGNOSIS — T1490XA Injury, unspecified, initial encounter: Secondary | ICD-10-CM

## 2016-09-25 DIAGNOSIS — R0682 Tachypnea, not elsewhere classified: Secondary | ICD-10-CM

## 2016-09-25 DIAGNOSIS — S064X9D Epidural hemorrhage with loss of consciousness of unspecified duration, subsequent encounter: Secondary | ICD-10-CM

## 2016-09-25 DIAGNOSIS — R52 Pain, unspecified: Secondary | ICD-10-CM

## 2016-09-25 DIAGNOSIS — J9600 Acute respiratory failure, unspecified whether with hypoxia or hypercapnia: Secondary | ICD-10-CM

## 2016-09-25 DIAGNOSIS — I158 Other secondary hypertension: Secondary | ICD-10-CM

## 2016-09-25 DIAGNOSIS — S064XAA Epidural hemorrhage with loss of consciousness status unknown, initial encounter: Secondary | ICD-10-CM

## 2016-09-25 DIAGNOSIS — S069X9D Unspecified intracranial injury with loss of consciousness of unspecified duration, subsequent encounter: Secondary | ICD-10-CM

## 2016-09-25 DIAGNOSIS — S064X9A Epidural hemorrhage with loss of consciousness of unspecified duration, initial encounter: Secondary | ICD-10-CM

## 2016-09-25 DIAGNOSIS — R739 Hyperglycemia, unspecified: Secondary | ICD-10-CM

## 2016-09-25 DIAGNOSIS — S02109A Fracture of base of skull, unspecified side, initial encounter for closed fracture: Secondary | ICD-10-CM

## 2016-09-25 DIAGNOSIS — S299XXA Unspecified injury of thorax, initial encounter: Secondary | ICD-10-CM

## 2016-09-25 DIAGNOSIS — S02109D Fracture of base of skull, unspecified side, subsequent encounter for fracture with routine healing: Secondary | ICD-10-CM

## 2016-09-25 DIAGNOSIS — E870 Hyperosmolality and hypernatremia: Secondary | ICD-10-CM

## 2016-09-25 DIAGNOSIS — D62 Acute posthemorrhagic anemia: Secondary | ICD-10-CM

## 2016-09-25 DIAGNOSIS — R7881 Bacteremia: Secondary | ICD-10-CM

## 2016-09-25 DIAGNOSIS — E876 Hypokalemia: Secondary | ICD-10-CM

## 2016-09-25 LAB — CBC WITH DIFFERENTIAL/PLATELET
BASOS PCT: 1 %
Basophils Absolute: 0.1 10*3/uL (ref 0.0–0.1)
EOS ABS: 0.2 10*3/uL (ref 0.0–0.7)
Eosinophils Relative: 2 %
HCT: 25.7 % — ABNORMAL LOW (ref 36.0–46.0)
HEMOGLOBIN: 7.8 g/dL — AB (ref 12.0–15.0)
Lymphocytes Relative: 17 %
Lymphs Abs: 1.7 10*3/uL (ref 0.7–4.0)
MCH: 29.1 pg (ref 26.0–34.0)
MCHC: 30.4 g/dL (ref 30.0–36.0)
MCV: 95.9 fL (ref 78.0–100.0)
MONO ABS: 0.9 10*3/uL (ref 0.1–1.0)
Monocytes Relative: 9 %
NEUTROS PCT: 71 %
Neutro Abs: 7.2 10*3/uL (ref 1.7–7.7)
Platelets: 712 10*3/uL — ABNORMAL HIGH (ref 150–400)
RBC: 2.68 MIL/uL — ABNORMAL LOW (ref 3.87–5.11)
RDW: 20.1 % — AB (ref 11.5–15.5)
WBC: 10.1 10*3/uL (ref 4.0–10.5)

## 2016-09-25 LAB — BASIC METABOLIC PANEL
Anion gap: 7 (ref 5–15)
BUN: 6 mg/dL (ref 6–20)
CALCIUM: 8 mg/dL — AB (ref 8.9–10.3)
CO2: 30 mmol/L (ref 22–32)
CREATININE: 0.71 mg/dL (ref 0.44–1.00)
Chloride: 113 mmol/L — ABNORMAL HIGH (ref 101–111)
GFR calc non Af Amer: >60 mL/min (ref >60)
Glucose, Bld: 130 mg/dL — ABNORMAL HIGH (ref 65–99)
Potassium: 2.9 mmol/L — ABNORMAL LOW (ref 3.5–5.1)
SODIUM: 150 mmol/L — AB (ref 135–145)

## 2016-09-25 LAB — GLUCOSE, CAPILLARY
GLUCOSE-CAPILLARY: 89 mg/dL (ref 65–99)
GLUCOSE-CAPILLARY: 95 mg/dL (ref 65–99)
Glucose-Capillary: 117 mg/dL — ABNORMAL HIGH (ref 65–99)
Glucose-Capillary: 94 mg/dL (ref 65–99)

## 2016-09-25 MED ORDER — CLONAZEPAM 1 MG PO TABS
2.0000 mg | ORAL_TABLET | Freq: Two times a day (BID) | ORAL | Status: DC
Start: 2016-09-25 — End: 2016-09-27
  Administered 2016-09-25 – 2016-09-26 (×4): 2 mg
  Filled 2016-09-25 (×4): qty 2

## 2016-09-25 MED ORDER — SODIUM CHLORIDE 0.9 % IV SOLN
30.0000 meq | Freq: Once | INTRAVENOUS | Status: AC
  Administered 2016-09-25: 30 meq via INTRAVENOUS
  Filled 2016-09-25: qty 15

## 2016-09-25 MED ORDER — QUETIAPINE FUMARATE 200 MG PO TABS
200.0000 mg | ORAL_TABLET | Freq: Two times a day (BID) | ORAL | Status: DC
  Administered 2016-09-25 – 2016-09-26 (×3): 200 mg
  Filled 2016-09-25 (×3): qty 1

## 2016-09-25 MED ORDER — SODIUM CHLORIDE 0.9% FLUSH: 10.0000 mL | INTRAVENOUS | Status: DC | PRN

## 2016-09-25 MED ORDER — POTASSIUM CHLORIDE 20 MEQ/15ML (10%) PO SOLN
40.0000 meq | Freq: Two times a day (BID) | ORAL | Status: AC
  Administered 2016-09-25 – 2016-09-26 (×3): 40 meq via ORAL
  Filled 2016-09-25 (×3): qty 30

## 2016-09-25 MED ORDER — FENTANYL CITRATE (PF) 100 MCG/2ML IJ SOLN
25.0000 ug | INTRAMUSCULAR | Status: DC | PRN
  Administered 2016-09-25 – 2016-09-26 (×6): 50 ug via INTRAVENOUS
  Filled 2016-09-25 (×5): qty 2
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
## 2016-09-25 NOTE — Clinical Social Work Placement (Signed)
   CLINICAL SOCIAL WORK PLACEMENT  NOTE  Date:  09/25/2016  Patient Details  Name: CHANCEY FOLKER MRN: [id number] Date of Birth: Feb 24, 1966  Clinical Social Work is seeking post-discharge placement for this patient at the [place] level of care (*CSW will initial, date and re-position this form in  chart as items are completed):  Yes   Patient/family provided with [place] Work Department's list of facilities offering this level of care within the geographic area requested by the patient (or if unable, by the patient's family).  Yes   Patient/family informed of their freedom to choose among providers that offer the needed level of care, that participate in Medicare, Medicaid or managed care program needed by the patient, have an available bed and are willing to accept the patient.  Yes   Patient/family informed of [facility]'s ownership interest in [facility] and [facility], as well as of the fact that they are under no obligation to receive care at these facilities.  PASRR submitted to EDS on 09/25/16     PASRR number received on       Existing PASRR number confirmed on       FL2 transmitted to all facilities in geographic area requested by pt/family on 09/25/16     FL2 transmitted to all facilities within larger geographic area on       Patient informed that his/her managed care company has contracts with or will negotiate with certain facilities, including the following:            Patient/family informed of bed offers received.  Patient chooses bed at       Physician recommends and patient chooses bed at      Patient to be transferred to   on  .  Patient to be transferred to facility by       Patient family notified on   of transfer.  Name of family member notified:        PHYSICIAN Please prepare priority discharge summary, including medications, Please prepare prescriptions, Please sign FL2     Additional Comment:     _______________________________________________ Rigoberto Noel, LCSW 09/25/2016, 12:38 PM
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
## 2016-09-26 LAB — BASIC METABOLIC PANEL
Anion gap: 8 (ref 5–15)
BUN: <5 mg/dL — ABNORMAL LOW (ref 6–20)
CALCIUM: 8.3 mg/dL — AB (ref 8.9–10.3)
CO2: 30 mmol/L (ref 22–32)
CREATININE: 0.81 mg/dL (ref 0.44–1.00)
Chloride: 117 mmol/L — ABNORMAL HIGH (ref 101–111)
GFR calc non Af Amer: >60 mL/min (ref >60)
GLUCOSE: 108 mg/dL — AB (ref 65–99)
Potassium: 3.4 mmol/L — ABNORMAL LOW (ref 3.5–5.1)
Sodium: 155 mmol/L — ABNORMAL HIGH (ref 135–145)

## 2016-09-26 LAB — GLUCOSE, CAPILLARY
GLUCOSE-CAPILLARY: 73 mg/dL (ref 65–99)
GLUCOSE-CAPILLARY: 80 mg/dL (ref 65–99)
GLUCOSE-CAPILLARY: 96 mg/dL (ref 65–99)
Glucose-Capillary: 92 mg/dL (ref 65–99)
Glucose-Capillary: 112 mg/dL — ABNORMAL HIGH (ref 65–99)
Glucose-Capillary: 78 mg/dL (ref 65–99)

## 2016-09-26 LAB — CBC
HEMATOCRIT: 24.1 % — AB (ref 36.0–46.0)
Hemoglobin: 7.4 g/dL — ABNORMAL LOW (ref 12.0–15.0)
MCH: 29.7 pg (ref 26.0–34.0)
MCHC: 30.7 g/dL (ref 30.0–36.0)
MCV: 96.8 fL (ref 78.0–100.0)
Platelets: 765 10*3/uL — ABNORMAL HIGH (ref 150–400)
RBC: 2.49 MIL/uL — ABNORMAL LOW (ref 3.87–5.11)
RDW: 20.9 % — AB (ref 11.5–15.5)
WBC: 9 10*3/uL (ref 4.0–10.5)

## 2016-09-26 MED ORDER — QUETIAPINE FUMARATE 50 MG PO TABS
200.0000 mg | ORAL_TABLET | Freq: Three times a day (TID) | ORAL | Status: DC
  Administered 2016-09-26 (×2): 200 mg
  Filled 2016-09-26 (×2): qty 1

## 2016-09-26 MED ORDER — FENTANYL 25 MCG/HR TD PT72
25.0000 ug | MEDICATED_PATCH | TRANSDERMAL | Status: DC
  Administered 2016-09-26: 25 ug via TRANSDERMAL
  Filled 2016-09-26: qty 1

## 2016-09-26 MED ORDER — FENTANYL CITRATE (PF) 100 MCG/2ML IJ SOLN
25.0000 ug | INTRAMUSCULAR | Status: DC | PRN
  Administered 2016-09-26 – 2016-09-28 (×3): 50 ug via INTRAVENOUS
  Filled 2016-09-26 (×3): qty 2
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
## 2016-09-26 NOTE — Progress Notes (Signed)
Trauma Service Note  Subjective: Patient agitated, still on Precedex drip.  Getting seroquel and Klonopin also  Objective: Vital signs in last 24 hours: Temp:  [98 F (36.7 C)-99.6 F (37.6 C)] 98 F (36.7 C) (12/20 0400) Pulse Rate:  [63-98] 69 (12/20 0901) Resp:  [18-32] 27 (12/20 0901) BP: (111-145)/(55-121) 135/79 (12/20 0900) SpO2:  [89 %-100 %] 97 % (12/20 0901) FiO2 (%):  [35 %] 35 % (12/20 0901) Weight:  [97.7 kg (215 lb 6.2 oz)] 97.7 kg (215 lb 6.2 oz) (12/20 0500) Last BM Date: 09/24/16  Intake/Output from previous day: 12/19 0701 - 12/20 0700 In: 2924.5 [I.V.:2004.5; NG/GT:45; IV Piggyback:875] Out: 6800 [Urine:6800] Intake/Output this shift: Total I/O In: 82.2 [I.V.:82.2] Out: -   General: Agitated in the bed.  However, she is working with the therapists.  Stood today with their assistance.  Lungs: Clear to auscultation.  Abd: Soft, good bowel sounds.  Pulled out her feeding tube again.  Extremities: No changes  Neuro: Intact  Lab Results: CBC   Recent Labs  09/25/16 0631 09/26/16 0523  WBC 10.1 9.0  HGB 7.8* 7.4*  HCT 25.7* 24.1*  PLT 712* 765*   BMET  Recent Labs  09/25/16 0631 09/26/16 0523  NA 150* 155*  K 2.9* 3.4*  CL 113* 117*  CO2 30 30  GLUCOSE 130* 108*  BUN 6 <5*  CREATININE 0.71 0.81  CALCIUM 8.0* 8.3*   PT/INR No results for input(s): LABPROT, INR in the last 72 hours. ABG No results for input(s): PHART, HCO3 in the last 72 hours.  Invalid input(s): PCO2, PO2  Studies/Results: Dg Chest Port 1 View  Result Date: 09/25/2016 CLINICAL DATA:  Hypoxia EXAM: PORTABLE CHEST 1 VIEW COMPARISON:  September 22, 2016 FINDINGS: Tracheostomy catheter tip is 5.1 cm above the carina. Feeding tube tip is below the diaphragm. Central catheter tip is in the superior vena cava. No pneumothorax. There is a small right pleural effusion. There is patchy airspace opacity in the left upper lobe and right lower lung zones as well as in the left  base. Heart is upper normal in size with pulmonary vascular within normal limits. No adenopathy. IMPRESSION: Tube and catheter positions as described without pneumothorax. Patchy opacity bilaterally. Question edema versus superimposed pneumonia. Both entities may exist concurrently. Small right pleural effusion. Stable cardiac silhouette. Electronically Signed   By: Lowella Grip III M.D.   On: 09/25/2016 08:44   Dg Abd Portable 1v  Result Date: 09/24/2016 CLINICAL DATA:  Post feeding tube placement. EXAM: PORTABLE ABDOMEN - 1 VIEW COMPARISON:  09/22/2016; 09/18/2016; CT the chest, abdomen pelvis - 09/11/2016 FINDINGS: Interval retraction of weighted tip enteric tube with tip now overlying the expected location the mid body of the stomach. Multiple surgical clips overlie the left upper abdominal quadrant. Post cholecystectomy. Mild gas distention of several loops of small bowel and colon without definite evidence enteric obstruction. No supine evidence of pneumoperitoneum. No definite pneumatosis or portal venous gas. Limited visualization of lower thorax is unremarkable. No acute osseus abnormalities. IMPRESSION: 1. Enteric tube tip projects over the mid body of the stomach. 2. Nonobstructive bowel gas pattern. Electronically Signed   By: Sandi Mariscal M.D.   On: 09/24/2016 15:23    Anti-infectives: Anti-infectives    Start     Dose/Rate Route Frequency Ordered Stop   09/24/16 1200  Ampicillin-Sulbactam (UNASYN) 3 g in sodium chloride 0.9 % 100 mL IVPB     3 g 200 mL/hr over 30 Minutes Intravenous Every  6 hours 09/24/16 1044     09/22/16 1300  meropenem (MERREM) 2 g in sodium chloride 0.9 % 100 mL IVPB  Status:  Discontinued     2 g 200 mL/hr over 30 Minutes Intravenous Every 8 hours 09/22/16 1218 09/22/16 1226   09/22/16 1300  meropenem (MERREM) 1 g in sodium chloride 0.9 % 100 mL IVPB  Status:  Discontinued     1 g 200 mL/hr over 30 Minutes Intravenous Every 8 hours 09/22/16 1226 09/24/16  1044   09/18/16 2200  ceFAZolin (ANCEF) IVPB 2g/100 mL premix  Status:  Discontinued     2 g 200 mL/hr over 30 Minutes Intravenous Every 8 hours 09/18/16 1500 09/22/16 1218   09/17/16 2300  vancomycin (VANCOCIN) IVPB 750 mg/150 ml premix  Status:  Discontinued     750 mg 150 mL/hr over 60 Minutes Intravenous Every 12 hours 09/17/16 1544 09/18/16 1500   09/17/16 2200  vancomycin (VANCOCIN) IVPB 750 mg/150 ml premix  Status:  Discontinued     750 mg 150 mL/hr over 60 Minutes Intravenous Every 12 hours 09/17/16 0909 09/17/16 1138   09/17/16 0915  vancomycin (VANCOCIN) 2,000 mg in sodium chloride 0.9 % 500 mL IVPB     2,000 mg 250 mL/hr over 120 Minutes Intravenous  Once 09/17/16 0907 09/17/16 1307   09/16/16 0800  piperacillin-tazobactam (ZOSYN) IVPB 3.375 g  Status:  Discontinued    Comments:  Zosyn 3.375 g IV q8h for CrCl > 10 mL/min   3.375 g 12.5 mL/hr over 240 Minutes Intravenous Every 8 hours 09/16/16 0735 09/18/16 1500      Assessment/Plan: s/p Procedure(s): ESOPHAGOGASTRODUODENOSCOPY (EGD) CPM  Try to get the patient off the Precedex.  LOS: 15 days   Kathryne Eriksson. [name], MD, FACS [phone] Trauma Surgeon 09/26/2016

## 2016-09-26 NOTE — Progress Notes (Signed)
Rehab admissions - I met briefly with patient's mother at the bedside this am.  I gave mom rehab booklets.  I will follow up with mom in am to determine caregiver support and explain inpatient rehab process.  Call me for questions.  #[phone] 

## 2016-09-27 LAB — GLUCOSE, CAPILLARY
GLUCOSE-CAPILLARY: 76 mg/dL (ref 65–99)
GLUCOSE-CAPILLARY: 127 mg/dL — AB (ref 65–99)
GLUCOSE-CAPILLARY: 99 mg/dL (ref 65–99)
Glucose-Capillary: 83 mg/dL (ref 65–99)
Glucose-Capillary: 84 mg/dL (ref 65–99)

## 2016-09-27 LAB — TYPE AND SCREEN
ABO/RH(D): O POS
ANTIBODY SCREEN: POSITIVE
DAT, IgG: NEGATIVE
UNIT DIVISION: 0
UNIT DIVISION: 0

## 2016-09-27 LAB — VITAMIN A: Vitamin A (Retinoic Acid): 22 ug/dL (ref 20–65)

## 2016-09-27 MED ORDER — DOCUSATE SODIUM 50 MG/5ML PO LIQD
100.0000 mg | Freq: Two times a day (BID) | ORAL | Status: DC | PRN
  Filled 2016-09-27: qty 10

## 2016-09-27 MED ORDER — CHLORHEXIDINE GLUCONATE 0.12 % MT SOLN
15.0000 mL | Freq: Two times a day (BID) | OROMUCOSAL | Status: DC
  Administered 2016-09-27 – 2016-10-02 (×12): 15 mL via OROMUCOSAL
  Filled 2016-09-27 (×11): qty 15

## 2016-09-27 MED ORDER — QUETIAPINE FUMARATE 50 MG PO TABS
200.0000 mg | ORAL_TABLET | Freq: Three times a day (TID) | ORAL | Status: DC
  Administered 2016-09-27 (×3): 200 mg via ORAL
  Filled 2016-09-27 (×3): qty 4

## 2016-09-27 MED ORDER — ADULT MULTIVITAMIN W/MINERALS CH
1.0000 | ORAL_TABLET | Freq: Every day | ORAL | Status: DC
  Administered 2016-09-27 – 2016-10-09 (×11): 1 via ORAL
  Filled 2016-09-27 (×13): qty 1

## 2016-09-27 MED ORDER — PANTOPRAZOLE SODIUM 40 MG PO PACK
40.0000 mg | PACK | Freq: Every day | ORAL | Status: DC
  Administered 2016-09-27 – 2016-10-09 (×11): 40 mg via ORAL
  Filled 2016-09-27 (×13): qty 20

## 2016-09-27 MED ORDER — VITAMIN B-1 100 MG PO TABS
100.0000 mg | ORAL_TABLET | Freq: Every day | ORAL | Status: DC
  Administered 2016-09-29 – 2016-10-09 (×10): 100 mg via ORAL
  Filled 2016-09-27 (×12): qty 1

## 2016-09-27 MED ORDER — CLONAZEPAM 1 MG PO TABS
2.0000 mg | ORAL_TABLET | Freq: Two times a day (BID) | ORAL | Status: DC
  Administered 2016-09-27 – 2016-10-03 (×13): 2 mg via ORAL
  Filled 2016-09-27 (×3): qty 2
  Filled 2016-09-27 (×2): qty 4
  Filled 2016-09-27 (×8): qty 2

## 2016-09-27 MED ORDER — ORAL CARE MOUTH RINSE
15.0000 mL | Freq: Two times a day (BID) | OROMUCOSAL | Status: DC
  Administered 2016-09-27 – 2016-10-02 (×9): 15 mL via OROMUCOSAL

## 2016-09-27 MED ORDER — ADULT MULTIVITAMIN LIQUID CH
15.0000 mL | Freq: Every day | ORAL | Status: DC
  Filled 2016-09-27: qty 15
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
## 2016-09-27 NOTE — Clinical Social Work Note (Signed)
FL2 and H&P faxed to [place] [facility] for review for PASARR as requested.  Dayton Scrape, [place]

## 2016-09-28 LAB — BASIC METABOLIC PANEL
Anion gap: 9 (ref 5–15)
BUN: 6 mg/dL (ref 6–20)
CALCIUM: 8.3 mg/dL — AB (ref 8.9–10.3)
CO2: 25 mmol/L (ref 22–32)
CREATININE: 0.82 mg/dL (ref 0.44–1.00)
Chloride: 117 mmol/L — ABNORMAL HIGH (ref 101–111)
GFR calc Af Amer: >60 mL/min (ref >60)
GLUCOSE: 90 mg/dL (ref 65–99)
POTASSIUM: 4.2 mmol/L (ref 3.5–5.1)
Sodium: 151 mmol/L — ABNORMAL HIGH (ref 135–145)

## 2016-09-28 LAB — GLUCOSE, CAPILLARY
GLUCOSE-CAPILLARY: 80 mg/dL (ref 65–99)
GLUCOSE-CAPILLARY: 81 mg/dL (ref 65–99)
GLUCOSE-CAPILLARY: 88 mg/dL (ref 65–99)
Glucose-Capillary: 72 mg/dL (ref 65–99)
Glucose-Capillary: 77 mg/dL (ref 65–99)
Glucose-Capillary: 74 mg/dL (ref 65–99)

## 2016-09-28 MED ORDER — KCL IN DEXTROSE-NACL 20-5-0.2 MEQ/L-%-% IV SOLN
INTRAVENOUS | Status: DC
  Administered 2016-10-02 – 2016-10-07 (×6): via INTRAVENOUS
  Filled 2016-09-28 (×15): qty 1000

## 2016-09-28 MED ORDER — HYDROCODONE-ACETAMINOPHEN 7.5-325 MG/15ML PO SOLN: 10.0000 mL | ORAL | Status: DC | PRN

## 2016-09-28 MED ORDER — QUETIAPINE FUMARATE 50 MG PO TABS
200.0000 mg | ORAL_TABLET | Freq: Two times a day (BID) | ORAL | Status: DC
  Administered 2016-09-28 – 2016-09-30 (×5): 200 mg via ORAL
  Filled 2016-09-28: qty 8
  Filled 2016-09-28 (×4): qty 4

## 2016-09-28 NOTE — Progress Notes (Addendum)
Trauma Service Note  Subjective: Patient is asleep most of the time.  No distress.  Not needing a lot of suctioning  Objective: Vital signs in last 24 hours: Temp:  [98.7 F (37.1 C)-99.8 F (37.7 C)] 99.3 F (37.4 C) (12/22 0725) Pulse Rate:  [72-107] 75 (12/22 0725) Resp:  [20-32] 20 (12/22 0725) BP: (115-134)/(64-83) 134/71 (12/22 0725) SpO2:  [93 %-100 %] 99 % (12/22 0725) FiO2 (%):  [28 %-30 %] 28 % (12/22 0745) Weight:  [95.3 kg (210 lb 1.6 oz)] 95.3 kg (210 lb 1.6 oz) (12/22 0406) Last BM Date: 09/27/16  Intake/Output from previous day: 12/21 0701 - 12/22 0700 In: 1842.5 [P.O.:270; I.V.:962.5; IV Piggyback:610] Out: 1325 [Urine:1325] Intake/Output this shift: Total I/O In: 400 [I.V.:400] Out: -   General: No acute distress.  Restrained with posey and wrists  Lungs: Clear.  Oxygen saturations are good.  Abd: Soft, benign, tolerating DI nectar thick diet  Extremities: No changes  Neuro: Intact  Lab Results: CBC   Recent Labs  09/26/16 0523  WBC 9.0  HGB 7.4*  HCT 24.1*  PLT 765*   BMET  Recent Labs  09/26/16 0523  NA 155*  K 3.4*  CL 117*  CO2 30  GLUCOSE 108*  BUN <5*  CREATININE 0.81  CALCIUM 8.3*   PT/INR No results for input(s): LABPROT, INR in the last 72 hours. ABG No results for input(s): PHART, HCO3 in the last 72 hours.  Invalid input(s): PCO2, PO2  Studies/Results: No results found.  Anti-infectives: Anti-infectives    Start     Dose/Rate Route Frequency Ordered Stop   09/24/16 1200  Ampicillin-Sulbactam (UNASYN) 3 g in sodium chloride 0.9 % 100 mL IVPB     3 g 200 mL/hr over 30 Minutes Intravenous Every 6 hours 09/24/16 1044     09/22/16 1300  meropenem (MERREM) 2 g in sodium chloride 0.9 % 100 mL IVPB  Status:  Discontinued     2 g 200 mL/hr over 30 Minutes Intravenous Every 8 hours 09/22/16 1218 09/22/16 1226   09/22/16 1300  meropenem (MERREM) 1 g in sodium chloride 0.9 % 100 mL IVPB  Status:  Discontinued     1  g 200 mL/hr over 30 Minutes Intravenous Every 8 hours 09/22/16 1226 09/24/16 1044   09/18/16 2200  ceFAZolin (ANCEF) IVPB 2g/100 mL premix  Status:  Discontinued     2 g 200 mL/hr over 30 Minutes Intravenous Every 8 hours 09/18/16 1500 09/22/16 1218   09/17/16 2300  vancomycin (VANCOCIN) IVPB 750 mg/150 ml premix  Status:  Discontinued     750 mg 150 mL/hr over 60 Minutes Intravenous Every 12 hours 09/17/16 1544 09/18/16 1500   09/17/16 2200  vancomycin (VANCOCIN) IVPB 750 mg/150 ml premix  Status:  Discontinued     750 mg 150 mL/hr over 60 Minutes Intravenous Every 12 hours 09/17/16 0909 09/17/16 1138   09/17/16 0915  vancomycin (VANCOCIN) 2,000 mg in sodium chloride 0.9 % 500 mL IVPB     2,000 mg 250 mL/hr over 120 Minutes Intravenous  Once 09/17/16 0907 09/17/16 1307   09/16/16 0800  piperacillin-tazobactam (ZOSYN) IVPB 3.375 g  Status:  Discontinued    Comments:  Zosyn 3.375 g IV q8h for CrCl > 10 mL/min   3.375 g 12.5 mL/hr over 240 Minutes Intravenous Every 8 hours 09/16/16 0735 09/18/16 1500      Assessment/Plan: s/p Procedure(s): ESOPHAGOGASTRODUODENOSCOPY (EGD) TBI--recovering Hypernatremia, changing IV fluids. Transfer to [facility] or [facility].  Still being  evaluated for Rehab  LOS: 17 days   Kathryne Eriksson. [name], MD, FACS [phone] Trauma Surgeon 09/28/2016
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
## 2016-09-28 NOTE — Progress Notes (Signed)
Rehab admissions - Noted patient more participatory with therapies and is now Ranchos V.  Currently rehab is full.  Will check progress on Tuesday after Christmas.  Call me for questions.  [phone]

## 2016-09-28 NOTE — Progress Notes (Signed)
Pt transferred to room from 78M with mother at bedside.  Pt restless and agitated with restraints attempted several times to exit bed and pull at trach. Her mother states that pt has been all morning. Trach in place noted dressing underneath trach intact. Pt attempts to communicate and follow commands. Safety measures in place. Call bell within reach. Respiratory paged.

## 2016-09-29 LAB — BASIC METABOLIC PANEL
Anion gap: 8 (ref 5–15)
BUN: 6 mg/dL (ref 6–20)
CHLORIDE: 115 mmol/L — AB (ref 101–111)
CO2: 27 mmol/L (ref 22–32)
Calcium: 8.4 mg/dL — ABNORMAL LOW (ref 8.9–10.3)
Creatinine, Ser: 0.87 mg/dL (ref 0.44–1.00)
GFR calc Af Amer: >60 mL/min (ref >60)
GLUCOSE: 92 mg/dL (ref 65–99)
POTASSIUM: 3.3 mmol/L — AB (ref 3.5–5.1)
Sodium: 150 mmol/L — ABNORMAL HIGH (ref 135–145)

## 2016-09-29 LAB — GLUCOSE, CAPILLARY
Glucose-Capillary: 79 mg/dL (ref 65–99)
Glucose-Capillary: 77 mg/dL (ref 65–99)
Glucose-Capillary: 79 mg/dL (ref 65–99)
Glucose-Capillary: 91 mg/dL (ref 65–99)

## 2016-09-29 MED ORDER — HYDROCODONE-ACETAMINOPHEN 7.5-325 MG/15ML PO SOLN
10.0000 mL | ORAL | Status: DC | PRN
  Administered 2016-10-01 (×2): 15 mL via ORAL
  Administered 2016-10-02 – 2016-10-03 (×2): 20 mL via ORAL
  Administered 2016-10-03: 10 mL via ORAL
  Administered 2016-10-06: 20 mL via ORAL
  Administered 2016-10-07: 10 mL via ORAL
  Administered 2016-10-07: 20 mL via ORAL
  Administered 2016-10-08: 15 mL via ORAL
  Administered 2016-10-08: 20 mL via ORAL
  Filled 2016-09-29: qty 15
  Filled 2016-09-29 (×2): qty 30
  Filled 2016-09-29: qty 15
  Filled 2016-09-29 (×3): qty 30
  Filled 2016-09-29: qty 15
  Filled 2016-09-29: qty 30
  Filled 2016-09-29: qty 15

## 2016-09-29 MED ORDER — LEVETIRACETAM 100 MG/ML PO SOLN
500.0000 mg | Freq: Two times a day (BID) | ORAL | Status: DC
  Administered 2016-09-29 – 2016-10-09 (×21): 500 mg via ORAL
  Filled 2016-09-29 (×28): qty 5

## 2016-09-29 MED ORDER — ENOXAPARIN SODIUM 30 MG/0.3ML ~~LOC~~ SOLN
30.0000 mg | Freq: Two times a day (BID) | SUBCUTANEOUS | Status: DC
  Administered 2016-09-29 – 2016-09-30 (×2): 30 mg via SUBCUTANEOUS
  Filled 2016-09-29 (×2): qty 0.3
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
## 2016-09-29 NOTE — Procedures (Signed)
Pt's trach changed per MD order to a #4cuffless shiley with no difficultly noted. Positive ETC02. Vitals as noted. Pt remained stable throughout.

## 2016-09-30 LAB — GLUCOSE, CAPILLARY
GLUCOSE-CAPILLARY: 104 mg/dL — AB (ref 65–99)
GLUCOSE-CAPILLARY: 95 mg/dL (ref 65–99)
GLUCOSE-CAPILLARY: 97 mg/dL (ref 65–99)
Glucose-Capillary: 99 mg/dL (ref 65–99)
Glucose-Capillary: 76 mg/dL (ref 65–99)

## 2016-09-30 LAB — CBC
HEMATOCRIT: 19.3 % — AB (ref 36.0–46.0)
Hemoglobin: 5.8 g/dL — CL (ref 12.0–15.0)
MCH: 29.6 pg (ref 26.0–34.0)
MCHC: 30.1 g/dL (ref 30.0–36.0)
MCV: 98.5 fL (ref 78.0–100.0)
PLATELETS: 870 10*3/uL — AB (ref 150–400)
RBC: 1.96 MIL/uL — AB (ref 3.87–5.11)
RDW: 21.5 % — AB (ref 11.5–15.5)
WBC: 10.2 10*3/uL (ref 4.0–10.5)

## 2016-09-30 LAB — PREPARE RBC (CROSSMATCH)

## 2016-09-30 MED ORDER — SODIUM CHLORIDE 0.9 % IV SOLN
Freq: Once | INTRAVENOUS | Status: AC
  Administered 2016-09-30: via INTRAVENOUS

## 2016-09-30 MED ORDER — NORETHINDRONE 0.35 MG PO TABS: 1.0000 | ORAL_TABLET | Freq: Every day | ORAL | Status: DC

## 2016-09-30 MED ORDER — BUPROPION HCL ER (XL) 150 MG PO TB24
300.0000 mg | ORAL_TABLET | Freq: Every day | ORAL | Status: DC
  Administered 2016-09-30 – 2016-10-09 (×8): 300 mg via ORAL
  Filled 2016-09-30: qty 1
  Filled 2016-09-30: qty 2
  Filled 2016-09-30: qty 1
  Filled 2016-09-30 (×2): qty 2
  Filled 2016-09-30 (×2): qty 1
  Filled 2016-09-30: qty 2
  Filled 2016-09-30: qty 1
  Filled 2016-09-30: qty 2

## 2016-09-30 MED ORDER — RESOURCE THICKENUP CLEAR PO POWD
ORAL | Status: DC | PRN
  Filled 2016-09-30 (×2): qty 125

## 2016-09-30 MED ORDER — QUETIAPINE FUMARATE 50 MG PO TABS
100.0000 mg | ORAL_TABLET | Freq: Three times a day (TID) | ORAL | Status: DC
  Administered 2016-09-30 – 2016-10-09 (×25): 100 mg via ORAL
  Filled 2016-09-30 (×3): qty 1
  Filled 2016-09-30 (×4): qty 2
  Filled 2016-09-30: qty 1
  Filled 2016-09-30: qty 2
  Filled 2016-09-30 (×3): qty 1
  Filled 2016-09-30 (×2): qty 2
  Filled 2016-09-30: qty 1
  Filled 2016-09-30: qty 2
  Filled 2016-09-30: qty 1
  Filled 2016-09-30 (×2): qty 2
  Filled 2016-09-30: qty 1
  Filled 2016-09-30 (×2): qty 2
  Filled 2016-09-30: qty 1
  Filled 2016-09-30: qty 2
  Filled 2016-09-30 (×3): qty 1

## 2016-09-30 MED ORDER — SODIUM CHLORIDE 0.9 % IV SOLN: Freq: Once | INTRAVENOUS | Status: DC

## 2016-09-30 NOTE — Progress Notes (Signed)
Pt noted to have rectal bleeding, passing multiple clots.  Trauma MD on call paged.  Orders to obtain a CBC and hold AM lovenox dose.  Will pass on to oncoming shift.  Will continue to monitor.  Cori Razor, RN
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
## 2016-09-30 NOTE — Significant Event (Addendum)
Rapid Response Event Note Call received per floor RN concerning second episode of moderate amount rectal bleeding with clots. Trauma MD updated on day shift and also updated on low hgb this evening. 2 Units PRBCs ordered and in process with blood bank. Lovenox held tonight. Dr. Barry Dienes paged and awaiting call back to update on second stool tonight.   Overview: Time Called: 2245 Arrival Time: 2255 Event Type: Other (Comment)  Initial Focused Assessment: Pt found in bed mild restlessness. Does not follow commands but makes eye contact, attempting to get OOB and reaches for trach at times, in restraints. MAEW x 4. This is her baseline neuro status per floor RN. VSS, lungs clear po2 97-100% on TC. ABD soft and nontender, BS hyperactive x 4. Skin warm and dry.   Interventions: Blood Bank called for update, blood will be ready in about 30-45 minutes. RN to obtain second IV. RN to monitor Pt closely and notify Provider and myself for acute change in VS. RRT to follow Patient tonight. No RRT interventions at this time.   Plan of Care (if not transferred):  Event Summary: Name of Physician Notified: Dr. Barry Dienes at 2245    at          [place], [place]

## 2016-09-30 NOTE — Progress Notes (Signed)
Received critical lab value of 5.8 hemoglobin.  Trauma MD on call paged.  Orders to infused 2 units of packed RBC and retest hemoglobin in the morning.  Ordered entered and will continue to monitor patient.

## 2016-10-01 ENCOUNTER — Inpatient Hospital Stay (HOSPITAL_COMMUNITY): Payer: Medicaid Other

## 2016-10-01 ENCOUNTER — Encounter (HOSPITAL_COMMUNITY): Payer: Self-pay

## 2016-10-01 ENCOUNTER — Encounter (HOSPITAL_COMMUNITY): Admission: EM | Disposition: A | Discharge status: Rehabilitation Facility | Payer: Self-pay | Source: Home / Self Care

## 2016-10-01 HISTORY — PX: ESOPHAGOGASTRODUODENOSCOPY: SHX5428

## 2016-10-01 LAB — CBC
HCT: 26.7 % — ABNORMAL LOW (ref 36.0–46.0)
HEMATOCRIT: 27.1 % — AB (ref 36.0–46.0)
HEMOGLOBIN: 7.9 g/dL — AB (ref 12.0–15.0)
HEMOGLOBIN: 8.8 g/dL — AB (ref 12.0–15.0)
MCH: 18.2 pg — ABNORMAL LOW (ref 26.0–34.0)
MCH: 29.4 pg (ref 26.0–34.0)
MCHC: 29.2 g/dL — AB (ref 30.0–36.0)
MCHC: 33 g/dL (ref 30.0–36.0)
MCV: 62.4 fL — ABNORMAL LOW (ref 78.0–100.0)
MCV: 89.3 fL (ref 78.0–100.0)
Platelets: 254 10*3/uL (ref 150–400)
Platelets: 540 10*3/uL — ABNORMAL HIGH (ref 150–400)
RBC: 4.34 MIL/uL (ref 3.87–5.11)
RBC: 2.99 MIL/uL — AB (ref 3.87–5.11)
RDW: 19.9 % — ABNORMAL HIGH (ref 11.5–15.5)
RDW: 21.4 % — ABNORMAL HIGH (ref 11.5–15.5)
WBC: 12.5 10*3/uL — ABNORMAL HIGH (ref 4.0–10.5)
WBC: 11.2 10*3/uL — AB (ref 4.0–10.5)

## 2016-10-01 LAB — BASIC METABOLIC PANEL
Anion gap: 8 (ref 5–15)
BUN: 13 mg/dL (ref 6–20)
CALCIUM: 8.8 mg/dL — AB (ref 8.9–10.3)
CO2: 25 mmol/L (ref 22–32)
CREATININE: 0.72 mg/dL (ref 0.44–1.00)
Chloride: 105 mmol/L (ref 101–111)
GFR calc non Af Amer: >60 mL/min (ref >60)
Glucose, Bld: 104 mg/dL — ABNORMAL HIGH (ref 65–99)
Potassium: 3.7 mmol/L (ref 3.5–5.1)
SODIUM: 138 mmol/L (ref 135–145)

## 2016-10-01 LAB — GLUCOSE, CAPILLARY
GLUCOSE-CAPILLARY: 78 mg/dL (ref 65–99)
Glucose-Capillary: 61 mg/dL — ABNORMAL LOW (ref 65–99)
Glucose-Capillary: 67 mg/dL (ref 65–99)
Glucose-Capillary: 70 mg/dL (ref 65–99)
Glucose-Capillary: 86 mg/dL (ref 65–99)

## 2016-10-01 LAB — PROTIME-INR
INR: 1.07
Prothrombin Time: 13.9 seconds (ref 11.4–15.2)

## 2016-10-01 SURGERY — EGD (ESOPHAGOGASTRODUODENOSCOPY)
Anesthesia: Moderate Sedation

## 2016-10-01 MED ORDER — FENTANYL CITRATE (PF) 100 MCG/2ML IJ SOLN
INTRAMUSCULAR | Status: AC
  Filled 2016-10-01: qty 2

## 2016-10-01 MED ORDER — FENTANYL CITRATE (PF) 100 MCG/2ML IJ SOLN
INTRAMUSCULAR | Status: DC | PRN
  Administered 2016-10-01 (×3): 25 ug via INTRAVENOUS

## 2016-10-01 MED ORDER — MIDAZOLAM HCL 5 MG/ML IJ SOLN
INTRAMUSCULAR | Status: AC
  Filled 2016-10-01: qty 2

## 2016-10-01 MED ORDER — MIDAZOLAM HCL 10 MG/2ML IJ SOLN
INTRAMUSCULAR | Status: DC | PRN
  Administered 2016-10-01: 2 mg via INTRAVENOUS
  Administered 2016-10-01: 1 mg via INTRAVENOUS
  Administered 2016-10-01: 2 mg via INTRAVENOUS

## 2016-10-01 MED ORDER — EPINEPHRINE PF 1 MG/10ML IJ SOSY
PREFILLED_SYRINGE | INTRAMUSCULAR | Status: AC
  Filled 2016-10-01: qty 10

## 2016-10-01 MED ORDER — DIPHENHYDRAMINE HCL 50 MG/ML IJ SOLN
INTRAMUSCULAR | Status: DC | PRN
  Administered 2016-10-01 (×2): 12.5 mg via INTRAVENOUS

## 2016-10-01 MED ORDER — PEG 3350-KCL-NA BICARB-NACL 420 G PO SOLR
4000.0000 mL | Freq: Once | ORAL | Status: AC
  Administered 2016-10-01: 4000 mL via ORAL
  Filled 2016-10-01: qty 4000

## 2016-10-01 MED ORDER — DIPHENHYDRAMINE HCL 50 MG/ML IJ SOLN
INTRAMUSCULAR | Status: AC
  Filled 2016-10-01: qty 1

## 2016-10-01 NOTE — Progress Notes (Signed)
Patient noted to have rectal bleeding with multiple clots.  Trauma MD on called paged.  Provider did not call back,  Paged trauma MD again.  RN will continue to monitor.

## 2016-10-01 NOTE — Progress Notes (Signed)
Trauma Service Note  Subjective: Patient continues to have large bloody bowel movements.  Possibly a history of ulcer disease.  She is a drikne also and may have varices, but this has never been documented  Objective: Vital signs in last 24 hours: Temp:  [97.2 F (36.2 C)-99.7 F (37.6 C)] 97.2 F (36.2 C) (12/25 0800) Pulse Rate:  [46-115] 79 (12/25 0825) Resp:  [11-25] 23 (12/25 0825) BP: (98-153)/(50-134) 98/67 (12/25 0825) SpO2:  [79 %-99 %] 96 % (12/25 0825) FiO2 (%):  [28 %] 28 % (12/25 0825) Weight:  [93.7 kg (206 lb 9.1 oz)] 93.7 kg (206 lb 9.1 oz) (12/25 0243) Last BM Date: 10/01/16  Intake/Output from previous day: 12/24 0701 - 12/25 0700 In: 1033.3 [I.V.:45; Blood:988.3] Out: -  Intake/Output this shift: No intake/output data recorded.  General: Agitated, no pain.  Will respond appropriately  Lungs: Clear  Abd: Soft not tender.  History of gastric bypass.  Lots of clotted blood per rectum  Extremities: No changes  Neuro: Intact, impaired but somewhat coherent.  Lab Results: CBC   Recent Labs  09/30/16 1928 10/01/16 0522  WBC 10.2 12.5*  HGB 5.8* 7.9*  HCT 19.3* 27.1*  PLT 870* 254   BMET  Recent Labs  09/29/16 0430 10/01/16 0522  NA 150* 138  K 3.3* 3.7  CL 115* 105  CO2 27 25  GLUCOSE 92 104*  BUN 6 13  CREATININE 0.87 0.72  CALCIUM 8.4* 8.8*   PT/INR  Recent Labs  10/01/16 0522  LABPROT 13.9  INR 1.07   ABG No results for input(s): PHART, HCO3 in the last 72 hours.  Invalid input(s): PCO2, PO2  Studies/Results: No results found.  Anti-infectives: Anti-infectives    Start     Dose/Rate Route Frequency Ordered Stop   09/24/16 1200  Ampicillin-Sulbactam (UNASYN) 3 g in sodium chloride 0.9 % 100 mL IVPB     3 g 200 mL/hr over 30 Minutes Intravenous Every 6 hours 09/24/16 1044 10/01/16 0116   09/22/16 1300  meropenem (MERREM) 2 g in sodium chloride 0.9 % 100 mL IVPB  Status:  Discontinued     2 g 200 mL/hr over 30 Minutes  Intravenous Every 8 hours 09/22/16 1218 09/22/16 1226   09/22/16 1300  meropenem (MERREM) 1 g in sodium chloride 0.9 % 100 mL IVPB  Status:  Discontinued     1 g 200 mL/hr over 30 Minutes Intravenous Every 8 hours 09/22/16 1226 09/24/16 1044   09/18/16 2200  ceFAZolin (ANCEF) IVPB 2g/100 mL premix  Status:  Discontinued     2 g 200 mL/hr over 30 Minutes Intravenous Every 8 hours 09/18/16 1500 09/22/16 1218   09/17/16 2300  vancomycin (VANCOCIN) IVPB 750 mg/150 ml premix  Status:  Discontinued     750 mg 150 mL/hr over 60 Minutes Intravenous Every 12 hours 09/17/16 1544 09/18/16 1500   09/17/16 2200  vancomycin (VANCOCIN) IVPB 750 mg/150 ml premix  Status:  Discontinued     750 mg 150 mL/hr over 60 Minutes Intravenous Every 12 hours 09/17/16 0909 09/17/16 1138   09/17/16 0915  vancomycin (VANCOCIN) 2,000 mg in sodium chloride 0.9 % 500 mL IVPB     2,000 mg 250 mL/hr over 120 Minutes Intravenous  Once 09/17/16 0907 09/17/16 1307   09/16/16 0800  piperacillin-tazobactam (ZOSYN) IVPB 3.375 g  Status:  Discontinued    Comments:  Zosyn 3.375 g IV q8h for CrCl > 10 mL/min   3.375 g 12.5 mL/hr over 240 Minutes  Intravenous Every 8 hours 09/16/16 0735 09/18/16 1500      Assessment/Plan: s/p Procedure(s): ESOPHAGOGASTRODUODENOSCOPY (EGD) LGI bleeding.  Etiology unknown. Will check coags GI service consultation.  LOS: 20 days   Kathryne Eriksson. [name], MD, FACS [phone] Trauma Surgeon 10/01/2016
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
## 2016-10-01 NOTE — Op Note (Signed)
[facility] Patient Name: Jacqueline [name] Procedure Date : 10/01/2016 MRN: [id number] Attending MD: Carol Ada , MD Date of Birth: October 10, 1965 CSN: [id number] Age: 50 Admit Type: Inpatient Procedure:                Upper GI endoscopy Indications:              Acute post hemorrhagic anemia, Hematochezia Providers:                Carol Ada, MD, Dortha Schwalbe RN, RN,                            William Dalton, Technician Referring MD:              Medicines:                Midazolam 5 mg IV, Fentanyl 75 micrograms IV,                            Diphenhydramine 25 mg IV Complications:            No immediate complications. Estimated Blood Loss:     Estimated blood loss: none. Procedure:                Pre-Anesthesia Assessment:                           - Prior to the procedure, a History and Physical                            was performed, and patient medications and                            allergies were reviewed. The patient's tolerance of                            previous anesthesia was also reviewed. The risks                            and benefits of the procedure and the sedation                            options and risks were discussed with the patient.                            All questions were answered, and informed consent                            was obtained. Prior Anticoagulants: The patient has                            taken no previous anticoagulant or antiplatelet                            agents. ASA Grade Assessment: IV - A patient with  severe systemic disease that is a constant threat                            to life. After reviewing the risks and benefits,                            the patient was deemed in satisfactory condition to                            undergo the procedure.                           - Sedation was administered by an endoscopy nurse.                            The sedation  level attained was moderate.                           After obtaining informed consent, the endoscope was                            passed under direct vision. Throughout the                            procedure, the patient's blood pressure, pulse, and                            oxygen saturations were monitored continuously. The                            [id number]) scope was introduced through the                            mouth, and advanced to the proximal jejunum. The                            upper GI endoscopy was somewhat difficult. The                            patient tolerated the procedure. Scope In: Scope Out: Findings:      The esophagus was normal.      Evidence of a Roux-en-Y gastrojejunostomy was found. The gastrojejunal       anastomosis was characterized by healthy appearing mucosa. This was       traversed. The pouch-to-jejunum limb was characterized by healthy       appearing mucosa.      The examined jejunum was normal.      the gastric pouch was 7-8 cm in length. No evidence of any ulcerations,       inflammation, erosions, or vascular abnormality. No evidence of bleeding       from the upper GI tract. Impression:               - Normal esophagus.                           -  Roux-en-Y gastrojejunostomy with gastrojejunal                            anastomosis characterized by healthy appearing                            mucosa.                           - Normal examined jejunum.                           - No specimens collected. Moderate Sedation:      Moderate (conscious) sedation was administered by the endoscopy nurse       and supervised by the endoscopist. The following parameters were       monitored: oxygen saturation, heart rate, blood pressure, and response       to care. Recommendation:           - Return patient to ICU for ongoing care.                           - Clear liquid diet.                           - Continue present  medications.                           - Colonsocopy with Dr. Loletha Carrow 10/04/2016. Procedure Code(s):        --- Professional ---                           [phone], Esophagogastroduodenoscopy, flexible,                            transoral; diagnostic, including collection of                            specimen(s) by brushing or washing, when performed                            (separate procedure) Diagnosis Code(s):        --- Professional ---                           D62, Acute posthemorrhagic anemia                           Z98.0, Intestinal bypass and anastomosis status                           K92.1, Melena (includes Hematochezia) CPT copyright 2016 American Medical Association. All rights reserved. The codes documented in this report are preliminary and upon coder review may  be revised to meet current compliance requirements. Carol Ada, MD Carol Ada, MD 10/01/2016 1:55:49 PM This report has been signed electronically. Number of Addenda: 0

## 2016-10-01 NOTE — Progress Notes (Signed)
Patient had large amt. Bloody drainage w/ clots from rectum.  Recent Hgb = 7.9.  MD to round  Will continue to monitor.  V/S stable.

## 2016-10-01 NOTE — Progress Notes (Signed)
Patient's mother called and updated re:  Patient's status.  Permission received for EGD, which will be performed at bedside by Dr. Benson Norway.

## 2016-10-01 NOTE — Progress Notes (Signed)
Trauma MD called and gave orders to have patient transferred to ICU, have PT & INR lab with AM lab orders.  Discontinue lovenox,  RN will continue to monitor.

## 2016-10-02 ENCOUNTER — Encounter (HOSPITAL_COMMUNITY): Admission: EM | Disposition: A | Discharge status: Rehabilitation Facility | Payer: Self-pay | Source: Home / Self Care

## 2016-10-02 ENCOUNTER — Encounter (HOSPITAL_COMMUNITY): Payer: Self-pay

## 2016-10-02 DIAGNOSIS — K921 Melena: Secondary | ICD-10-CM

## 2016-10-02 DIAGNOSIS — K6389 Other specified diseases of intestine: Secondary | ICD-10-CM

## 2016-10-02 DIAGNOSIS — D490 Neoplasm of unspecified behavior of digestive system: Secondary | ICD-10-CM

## 2016-10-02 HISTORY — PX: COLONOSCOPY: SHX5424

## 2016-10-02 LAB — GLUCOSE, CAPILLARY
GLUCOSE-CAPILLARY: 77 mg/dL (ref 65–99)
GLUCOSE-CAPILLARY: 94 mg/dL (ref 65–99)
GLUCOSE-CAPILLARY: 76 mg/dL (ref 65–99)
Glucose-Capillary: 74 mg/dL (ref 65–99)
Glucose-Capillary: 88 mg/dL (ref 65–99)
Glucose-Capillary: 76 mg/dL (ref 65–99)

## 2016-10-02 LAB — CBC
HCT: 24.9 % — ABNORMAL LOW (ref 36.0–46.0)
HCT: 24.5 % — ABNORMAL LOW (ref 36.0–46.0)
Hemoglobin: 7.9 g/dL — ABNORMAL LOW (ref 12.0–15.0)
Hemoglobin: 7.8 g/dL — ABNORMAL LOW (ref 12.0–15.0)
MCH: 29.3 pg (ref 26.0–34.0)
MCH: 29.5 pg (ref 26.0–34.0)
MCHC: 31.7 g/dL (ref 30.0–36.0)
MCHC: 31.8 g/dL (ref 30.0–36.0)
MCV: 92.2 fL (ref 78.0–100.0)
MCV: 92.8 fL (ref 78.0–100.0)
Platelets: 649 10*3/uL — ABNORMAL HIGH (ref 150–400)
Platelets: 632 10*3/uL — ABNORMAL HIGH (ref 150–400)
RBC: 2.7 MIL/uL — ABNORMAL LOW (ref 3.87–5.11)
RBC: 2.64 MIL/uL — ABNORMAL LOW (ref 3.87–5.11)
RDW: 22.4 % — AB (ref 11.5–15.5)
RDW: 22.3 % — AB (ref 11.5–15.5)
WBC: 7.2 10*3/uL (ref 4.0–10.5)
WBC: 8 10*3/uL (ref 4.0–10.5)

## 2016-10-02 SURGERY — COLONOSCOPY
Anesthesia: Moderate Sedation

## 2016-10-02 MED ORDER — EPINEPHRINE PF 1 MG/10ML IJ SOSY
PREFILLED_SYRINGE | INTRAMUSCULAR | Status: AC
  Filled 2016-10-02: qty 10

## 2016-10-02 MED ORDER — DIPHENHYDRAMINE HCL 50 MG/ML IJ SOLN
INTRAMUSCULAR | Status: AC
  Filled 2016-10-02: qty 1

## 2016-10-02 MED ORDER — FENTANYL CITRATE (PF) 100 MCG/2ML IJ SOLN
INTRAMUSCULAR | Status: AC
  Filled 2016-10-02: qty 4

## 2016-10-02 MED ORDER — MIDAZOLAM HCL 5 MG/ML IJ SOLN
INTRAMUSCULAR | Status: AC
  Filled 2016-10-02: qty 3

## 2016-10-02 MED ORDER — FENTANYL CITRATE (PF) 100 MCG/2ML IJ SOLN
INTRAMUSCULAR | Status: DC | PRN
  Administered 2016-10-02 (×4): 25 ug via INTRAVENOUS

## 2016-10-02 NOTE — Progress Notes (Signed)
Pt with lower GI bleed noted over the weekend with drop in hemoglobin.  Endoscopy performed yesterday with normal results.  Planning possible colonoscopy today with GI.  Will follow progress.  Ultimately, planning dc to CIR once medial issues resolved.    Reinaldo Raddle, RN, BSN  Trauma/Neuro ICU Case Manager [phone]
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
## 2016-10-02 NOTE — Op Note (Addendum)
[facility] Patient Name: Jacqueline [name] Procedure Date : 10/02/2016 MRN: [id number] Attending MD: Estill Cotta. Loletha Carrow , MD Date of Birth: 02/10/1966 CSN: [id number] Age: 50 Admit Type: Inpatient Procedure:                Colonoscopy Indications:              Hematochezia Providers:                Mallie Mussel L. Loletha Carrow, MD, Dortha Schwalbe, RN, Cletis [place], Technician Referring MD:              Medicines:                Fentanyl 100 micrograms IV, the patient had also                            been given seroquel and klonopin shortly before the                            procedure Complications:            No immediate complications. Estimated Blood Loss:     Estimated blood loss was minimal. Procedure:                Pre-Anesthesia Assessment:                           - Prior to the procedure, a History and Physical                            was performed, and patient medications and                            allergies were reviewed. The patient's tolerance of                            previous anesthesia was also reviewed. The risks                            and benefits of the procedure and the sedation                            options and risks were discussed with the patient.                            All questions were answered, and informed consent                            was obtained. Prior Anticoagulants: The patient has                            taken no previous anticoagulant or antiplatelet                            agents. ASA  Grade Assessment: III - A patient with                            severe systemic disease. After reviewing the risks                            and benefits, the patient was deemed in                            satisfactory condition to undergo the procedure.                           After obtaining informed consent, the colonoscope                            was passed under direct vision. Throughout  the                            procedure, the patient's blood pressure, pulse, and                            oxygen saturations were monitored continuously. The                            [id number] scope was introduced through                            the anus and advanced to the the ascending colon.                            The colonoscopy was performed with moderate                            difficulty due to a partially obstructing mass and                            significant looping. The patient tolerated the                            procedure. The quality of the bowel preparation was                            poor. No anatomical landmarks were photographed.                            The bowel preparation used was GoLYTELY. Scope In: 1:06:19 PM Scope Out: 1:32:30 PM Total Procedure Duration: 0 hours 26 minutes 11 seconds  Findings:      The perianal and digital rectal examinations were normal.      A fungating and ulcerated, necrotic completely obstructing large mass       was found in the distal ascending colon. The mass was circumferential.       No bleeding was present. This was biopsied with a cold forceps for  histology.      A few medium-mouthed diverticula were found in the entire colon.      Retroflexion in the rectum was not performed due to anatomy. Impression:               - Preparation of the colon was poor.                           - Completely obstructing tumor in the distal                            ascending colon. Biopsied. Worrisome for possible                            malignancy, less likely severe ischemia.                           - Diverticulosis in the entire examined colon. Moderate Sedation:      Moderate (conscious) sedation was administered by the endoscopy nurse       and supervised by the endoscopist. The following parameters were       monitored: oxygen saturation, heart rate, blood pressure, respiratory       rate,  EKG, adequacy of pulmonary ventilation, and response to care.       Total physician intraservice time was 26 minutes. Recommendation:           - Resume previous diet.                           - Await pathology results.                           - Continue present medications.                           - No recommendation at this time regarding repeat                            colonoscopy. Procedure Code(s):        --- Professional ---                           [phone], 75, Colonoscopy, flexible; with biopsy,                            single or multiple Diagnosis Code(s):        --- Professional ---                           D49.0, Neoplasm of unspecified behavior of                            digestive system                           K92.1, Melena (includes Hematochezia)                           K57.30,  Diverticulosis of large intestine without                            perforation or abscess without bleeding CPT copyright 2016 American Medical Association. All rights reserved. The codes documented in this report are preliminary and upon coder review may  be revised to meet current compliance requirements. Pritika Alvarez L. Loletha Carrow, MD 10/02/2016 5:40:27 PM This report has been signed electronically. Number of Addenda: 0

## 2016-10-02 NOTE — Interval H&P Note (Signed)
History and Physical Interval Note:  10/02/2016 12:42 PM  Jacqueline [name]  has presented today for surgery, with the diagnosis of lower GI bleed  The various methods of treatment have been discussed with the patient and family. After consideration of risks, benefits and other options for treatment, the patient has consented to  Procedure(s): COLONOSCOPY (N/A) as a surgical intervention .  The patient's history has been reviewed, patient examined, no change in status, stable for surgery.  I have reviewed the patient's chart and labs.  Questions were answered to the patient's satisfaction.     EGD found no source of bleeding yesterday. Colonoscopy planned for today, pending consent from mother.  Nelida Meuse III

## 2016-10-02 NOTE — Op Note (Signed)
Procedure:   Colonoscopy to ascending colon (limited by obstructing mass)  Meds:   Fentanyl 75 micrograms IV (patient had been given seroquel and klonopin shortly before procedure)  Indication:  hematochezia  Quality of preparation:  poor  Findings:   Large, obstructing mass in mid to distal ascending colon - poorly visualized, bad prep. Extensive biopsies taken  No active bleeding   Impression:  Friable mass, worrisome for malignancy.  Less likely severe ischemic colitis given its appearance location  Recommendations:  Ok to resume previous diet Await biopsies Mother updated Consider repeat CT scan abdomen /pelvis (original scan does not seem to have visualized this area well and there was no oral contrast.   Dorna Leitz GI Pager [phone]
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
## 2016-10-02 NOTE — Progress Notes (Signed)
OT Cancellation Note  Patient Details Name: ELLIEANA WAITERS MRN: [id number] DOB: 03-08-1966   Cancelled Treatment:    Reason Eval/Treat Not Completed: Patient at procedure or test/ unavailable  Vonita Moss   OTR/L Pager: [phone] Office: [phone] .  10/02/2016, 1:32 PM

## 2016-10-02 NOTE — Progress Notes (Signed)
Rehab admissions - Noted events of the past few days.  Not medically ready yet for acute inpatient rehab admission.  Call me for questions.  RC:[phone]
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
## 2016-10-03 ENCOUNTER — Encounter (HOSPITAL_COMMUNITY): Payer: Self-pay | Admitting: Anesthesiology

## 2016-10-03 ENCOUNTER — Inpatient Hospital Stay (HOSPITAL_COMMUNITY): Payer: Medicaid Other

## 2016-10-03 ENCOUNTER — Inpatient Hospital Stay (HOSPITAL_COMMUNITY): Payer: Medicaid Other | Admitting: Anesthesiology

## 2016-10-03 ENCOUNTER — Encounter (HOSPITAL_COMMUNITY): Admission: EM | Disposition: A | Discharge status: Rehabilitation Facility | Payer: Self-pay | Source: Home / Self Care

## 2016-10-03 HISTORY — PX: PARTIAL COLECTOMY: SHX5273

## 2016-10-03 LAB — GLUCOSE, CAPILLARY
GLUCOSE-CAPILLARY: 90 mg/dL (ref 65–99)
GLUCOSE-CAPILLARY: 138 mg/dL — AB (ref 65–99)
Glucose-Capillary: 145 mg/dL — ABNORMAL HIGH (ref 65–99)
Glucose-Capillary: 145 mg/dL — ABNORMAL HIGH (ref 65–99)
Glucose-Capillary: 72 mg/dL (ref 65–99)
Glucose-Capillary: 79 mg/dL (ref 65–99)

## 2016-10-03 LAB — POCT I-STAT 7, (LYTES, BLD GAS, ICA,H+H)
Acid-base deficit: 2 mmol/L (ref 0.0–2.0)
BICARBONATE: 22.3 mmol/L (ref 20.0–28.0)
CALCIUM ION: 1.13 mmol/L — AB (ref 1.15–1.40)
HCT: 24 % — ABNORMAL LOW (ref 36.0–46.0)
Hemoglobin: 8.2 g/dL — ABNORMAL LOW (ref 12.0–15.0)
O2 SAT: 98 %
Patient temperature: 35.8
Potassium: 3.1 mmol/L — ABNORMAL LOW (ref 3.5–5.1)
SODIUM: 149 mmol/L — AB (ref 135–145)
TCO2: 23 mmol/L (ref 0–100)
pCO2 arterial: 35 mmHg (ref 32.0–48.0)
pH, Arterial: 7.407 (ref 7.350–7.450)
pO2, Arterial: 107 mmHg (ref 83.0–108.0)

## 2016-10-03 LAB — CBC
HEMATOCRIT: 29.1 % — AB (ref 36.0–46.0)
HEMATOCRIT: 27.6 % — AB (ref 36.0–46.0)
Hemoglobin: 9.1 g/dL — ABNORMAL LOW (ref 12.0–15.0)
Hemoglobin: 8.7 g/dL — ABNORMAL LOW (ref 12.0–15.0)
MCH: 29.5 pg (ref 26.0–34.0)
MCH: 29.6 pg (ref 26.0–34.0)
MCHC: 31.3 g/dL (ref 30.0–36.0)
MCHC: 31.5 g/dL (ref 30.0–36.0)
MCV: 94.5 fL (ref 78.0–100.0)
MCV: 93.9 fL (ref 78.0–100.0)
Platelets: 625 10*3/uL — ABNORMAL HIGH (ref 150–400)
Platelets: 805 10*3/uL — ABNORMAL HIGH (ref 150–400)
RBC: 3.08 MIL/uL — ABNORMAL LOW (ref 3.87–5.11)
RBC: 2.94 MIL/uL — AB (ref 3.87–5.11)
RDW: 22.4 % — AB (ref 11.5–15.5)
RDW: 22.1 % — ABNORMAL HIGH (ref 11.5–15.5)
WBC: 10.1 10*3/uL (ref 4.0–10.5)
WBC: 15.7 10*3/uL — ABNORMAL HIGH (ref 4.0–10.5)

## 2016-10-03 LAB — BASIC METABOLIC PANEL
Anion gap: 6 (ref 5–15)
BUN: 6 mg/dL (ref 6–20)
CALCIUM: 8.8 mg/dL — AB (ref 8.9–10.3)
CO2: 27 mmol/L (ref 22–32)
CREATININE: 0.97 mg/dL (ref 0.44–1.00)
Chloride: 117 mmol/L — ABNORMAL HIGH (ref 101–111)
GFR calc Af Amer: >60 mL/min (ref >60)
GFR calc non Af Amer: >60 mL/min (ref >60)
GLUCOSE: 106 mg/dL — AB (ref 65–99)
Potassium: 3.1 mmol/L — ABNORMAL LOW (ref 3.5–5.1)
Sodium: 150 mmol/L — ABNORMAL HIGH (ref 135–145)

## 2016-10-03 LAB — PREPARE RBC (CROSSMATCH)

## 2016-10-03 SURGERY — COLECTOMY, PARTIAL
Anesthesia: General | Site: Abdomen | Laterality: Right

## 2016-10-03 MED ORDER — PROPOFOL 500 MG/50ML IV EMUL
INTRAVENOUS | Status: DC | PRN
  Administered 2016-10-03: 100 ug/kg/min via INTRAVENOUS

## 2016-10-03 MED ORDER — NOREPINEPHRINE BITARTRATE 1 MG/ML IV SOLN
0.0000 ug/min | INTRAVENOUS | Status: AC
  Administered 2016-10-03: 3 ug/min via INTRAVENOUS
  Filled 2016-10-03: qty 4

## 2016-10-03 MED ORDER — LIDOCAINE HCL (CARDIAC) 20 MG/ML IV SOLN
INTRAVENOUS | Status: DC | PRN
  Administered 2016-10-03: 60 mg via INTRAVENOUS

## 2016-10-03 MED ORDER — PROPOFOL 10 MG/ML IV BOLUS
INTRAVENOUS | Status: DC | PRN
  Administered 2016-10-03: 100 mg via INTRAVENOUS

## 2016-10-03 MED ORDER — SODIUM CHLORIDE 0.9% FLUSH
10.0000 mL | Freq: Two times a day (BID) | INTRAVENOUS | Status: DC
  Administered 2016-10-03 – 2016-10-04 (×2): 10 mL
  Administered 2016-10-05: 30 mL
  Administered 2016-10-05: 10 mL
  Administered 2016-10-06 (×2): 30 mL
  Administered 2016-10-07 – 2016-10-09 (×2): 10 mL

## 2016-10-03 MED ORDER — ROCURONIUM BROMIDE 100 MG/10ML IV SOLN
INTRAVENOUS | Status: DC | PRN
  Administered 2016-10-03 (×2): 20 mg via INTRAVENOUS
  Administered 2016-10-03: 60 mg via INTRAVENOUS

## 2016-10-03 MED ORDER — ORAL CARE MOUTH RINSE
15.0000 mL | Freq: Two times a day (BID) | OROMUCOSAL | Status: DC
  Administered 2016-10-04 – 2016-10-08 (×8): 15 mL via OROMUCOSAL

## 2016-10-03 MED ORDER — PROPOFOL 10 MG/ML IV BOLUS
INTRAVENOUS | Status: AC
  Filled 2016-10-03: qty 20

## 2016-10-03 MED ORDER — SODIUM CHLORIDE 0.9% FLUSH
10.0000 mL | INTRAVENOUS | Status: DC | PRN
  Administered 2016-10-08: 20 mL
  Filled 2016-10-03: qty 40

## 2016-10-03 MED ORDER — HEMOSTATIC AGENTS (NO CHARGE) OPTIME
TOPICAL | Status: DC | PRN
  Administered 2016-10-03: 1 via TOPICAL

## 2016-10-03 MED ORDER — ALBUMIN HUMAN 5 % IV SOLN
INTRAVENOUS | Status: DC | PRN
  Administered 2016-10-03 (×2): via INTRAVENOUS

## 2016-10-03 MED ORDER — PHENYLEPHRINE HCL 10 MG/ML IJ SOLN
INTRAVENOUS | Status: DC | PRN
  Administered 2016-10-03: 40 ug/min via INTRAVENOUS

## 2016-10-03 MED ORDER — ROCURONIUM BROMIDE 10 MG/ML (PF) SYRINGE
PREFILLED_SYRINGE | INTRAVENOUS | Status: AC
Start: 2016-10-03 — End: 2016-10-03
  Filled 2016-10-03: qty 5

## 2016-10-03 MED ORDER — MIDAZOLAM HCL 5 MG/5ML IJ SOLN
INTRAMUSCULAR | Status: DC | PRN
  Administered 2016-10-03: 2 mg via INTRAVENOUS

## 2016-10-03 MED ORDER — LACTATED RINGERS IV SOLN
INTRAVENOUS | Status: DC | PRN
  Administered 2016-10-03: 13:00:00 via INTRAVENOUS

## 2016-10-03 MED ORDER — ONDANSETRON HCL 4 MG/2ML IJ SOLN
INTRAMUSCULAR | Status: DC | PRN
  Administered 2016-10-03: 4 mg via INTRAVENOUS

## 2016-10-03 MED ORDER — MIDAZOLAM HCL 2 MG/2ML IJ SOLN
INTRAMUSCULAR | Status: AC
  Filled 2016-10-03: qty 2

## 2016-10-03 MED ORDER — PROPOFOL 1000 MG/100ML IV EMUL
5.0000 ug/kg/min | INTRAVENOUS | Status: DC
  Administered 2016-10-03: 55 ug/kg/min via INTRAVENOUS
  Filled 2016-10-03: qty 100

## 2016-10-03 MED ORDER — DEXTROSE 5 % IV SOLN
2.0000 g | INTRAVENOUS | Status: AC
  Administered 2016-10-03: 2 g via INTRAVENOUS
  Filled 2016-10-03: qty 2

## 2016-10-03 MED ORDER — FENTANYL CITRATE (PF) 100 MCG/2ML IJ SOLN
INTRAMUSCULAR | Status: DC | PRN
Start: 2016-10-03 — End: 2016-10-03
  Administered 2016-10-03: 50 ug via INTRAVENOUS
  Administered 2016-10-03: 100 ug via INTRAVENOUS
  Administered 2016-10-03: 50 ug via INTRAVENOUS

## 2016-10-03 MED ORDER — NOREPINEPHRINE BITARTRATE 1 MG/ML IV SOLN
0.0000 ug/min | INTRAVENOUS | Status: DC
  Filled 2016-10-03: qty 4

## 2016-10-03 MED ORDER — FENTANYL CITRATE (PF) 100 MCG/2ML IJ SOLN
INTRAMUSCULAR | Status: AC
  Filled 2016-10-03: qty 4

## 2016-10-03 MED ORDER — LACTATED RINGERS IV SOLN
INTRAVENOUS | Status: DC
  Administered 2016-10-03: 12:00:00 via INTRAVENOUS

## 2016-10-03 MED ORDER — HYDROMORPHONE HCL 1 MG/ML IJ SOLN: 0.5000 mg | INTRAMUSCULAR | Status: DC | PRN

## 2016-10-03 MED ORDER — PHENYLEPHRINE HCL 10 MG/ML IJ SOLN
INTRAMUSCULAR | Status: DC | PRN
  Administered 2016-10-03 (×5): 80 ug via INTRAVENOUS

## 2016-10-03 MED ORDER — LACTATED RINGERS IV SOLN
INTRAVENOUS | Status: DC
  Administered 2016-10-03: 11:00:00 via INTRAVENOUS

## 2016-10-03 MED ORDER — 0.9 % SODIUM CHLORIDE (POUR BTL) OPTIME
TOPICAL | Status: DC | PRN
  Administered 2016-10-03: 1000 mL
  Administered 2016-10-03: 2000 mL

## 2016-10-03 MED ORDER — CHLORHEXIDINE GLUCONATE 0.12 % MT SOLN
15.0000 mL | Freq: Two times a day (BID) | OROMUCOSAL | Status: DC
  Administered 2016-10-03 – 2016-10-09 (×11): 15 mL via OROMUCOSAL
  Filled 2016-10-03 (×8): qty 15

## 2016-10-03 MED ORDER — EPHEDRINE SULFATE 50 MG/ML IJ SOLN
INTRAMUSCULAR | Status: DC | PRN
  Administered 2016-10-03: 10 mg via INTRAVENOUS

## 2016-10-03 MED ORDER — HYDROMORPHONE HCL 1 MG/ML IJ SOLN
0.5000 mg | INTRAMUSCULAR | Status: DC | PRN
  Administered 2016-10-03: 1 mg via INTRAVENOUS
  Administered 2016-10-04 (×3): 2 mg via INTRAVENOUS
  Administered 2016-10-04: 1 mg via INTRAVENOUS
  Administered 2016-10-05 (×2): 2 mg via INTRAVENOUS
  Filled 2016-10-03 (×2): qty 2
  Filled 2016-10-03: qty 1
  Filled 2016-10-03: qty 2
  Filled 2016-10-03: qty 1
  Filled 2016-10-03 (×3): qty 2

## 2016-10-03 SURGICAL SUPPLY — 58 items
BLADE SURG ROTATE 9660 (MISCELLANEOUS) ×3 IMPLANT
CANISTER SUCTION 2500CC (MISCELLANEOUS) ×3 IMPLANT
CHLORAPREP W/TINT 26ML (MISCELLANEOUS) ×3 IMPLANT
COVER MAYO STAND STRL (DRAPES) ×6 IMPLANT
COVER SURGICAL LIGHT HANDLE (MISCELLANEOUS) ×3 IMPLANT
DRAPE LAPAROSCOPIC ABDOMINAL (DRAPES) ×3 IMPLANT
DRAPE PROXIMA HALF (DRAPES) ×6 IMPLANT
DRAPE UTILITY XL STRL (DRAPES) ×15 IMPLANT
DRAPE WARM FLUID 44X44 (DRAPE) ×3 IMPLANT
DRSG OPSITE POSTOP 3X4 (GAUZE/BANDAGES/DRESSINGS) ×3 IMPLANT
DRSG OPSITE POSTOP 4X10 (GAUZE/BANDAGES/DRESSINGS) ×3 IMPLANT
DRSG OPSITE POSTOP 4X6 (GAUZE/BANDAGES/DRESSINGS) ×3 IMPLANT
DRSG OPSITE POSTOP 4X8 (GAUZE/BANDAGES/DRESSINGS) IMPLANT
DRSG TELFA 3X8 NADH (GAUZE/BANDAGES/DRESSINGS) ×3 IMPLANT
ELECT BLADE 4.0 EZ CLEAN MEGAD (MISCELLANEOUS) ×3
ELECT BLADE 6.5 EXT (BLADE) ×3 IMPLANT
ELECT CAUTERY BLADE 6.4 (BLADE) ×6 IMPLANT
ELECT REM PT RETURN 9FT ADLT (ELECTROSURGICAL) ×3
ELECTRODE BLDE 4.0 EZ CLN MEGD (MISCELLANEOUS) ×1 IMPLANT
ELECTRODE REM PT RTRN 9FT ADLT (ELECTROSURGICAL) ×1 IMPLANT
GLOVE BIOGEL PI IND STRL 8 (GLOVE) ×2 IMPLANT
GLOVE BIOGEL PI INDICATOR 8 (GLOVE) ×4
GLOVE ECLIPSE 7.5 STRL STRAW (GLOVE) ×6 IMPLANT
GOWN STRL REUS W/ TWL LRG LVL3 (GOWN DISPOSABLE) ×6 IMPLANT
GOWN STRL REUS W/TWL LRG LVL3 (GOWN DISPOSABLE) ×12
HEMOSTAT SURGICEL 2X14 (HEMOSTASIS) ×3 IMPLANT
KIT BASIN OR (CUSTOM PROCEDURE TRAY) ×3 IMPLANT
KIT ROOM TURNOVER OR (KITS) ×3 IMPLANT
LEGGING LITHOTOMY PAIR STRL (DRAPES) IMPLANT
LIGASURE IMPACT 36 18CM CVD LR (INSTRUMENTS) ×3 IMPLANT
NS IRRIG 1000ML POUR BTL (IV SOLUTION) ×6 IMPLANT
PACK GENERAL/GYN (CUSTOM PROCEDURE TRAY) ×3 IMPLANT
PAD ARMBOARD 7.5X6 YLW CONV (MISCELLANEOUS) ×3 IMPLANT
PENCIL BUTTON HOLSTER BLD 10FT (ELECTRODE) ×3 IMPLANT
RELOAD PROXIMATE 75MM BLUE (ENDOMECHANICALS) ×6 IMPLANT
SPECIMEN JAR X LARGE (MISCELLANEOUS) ×3 IMPLANT
SPONGE LAP 18X18 X RAY DECT (DISPOSABLE) ×6 IMPLANT
STAPLER GUN LINEAR PROX 60 (STAPLE) ×3 IMPLANT
STAPLER PROXIMATE 75MM BLUE (STAPLE) ×3 IMPLANT
STAPLER VISISTAT 35W (STAPLE) ×3 IMPLANT
SUCTION POOLE TIP (SUCTIONS) ×3 IMPLANT
SURGILUBE 2OZ TUBE FLIPTOP (MISCELLANEOUS) IMPLANT
SUT PDS AB 1 TP1 96 (SUTURE) ×6 IMPLANT
SUT PROLENE 2 0 CT2 30 (SUTURE) IMPLANT
SUT PROLENE 2 0 KS (SUTURE) IMPLANT
SUT SILK 2 0 SH CR/8 (SUTURE) ×3 IMPLANT
SUT SILK 2 0 TIES 10X30 (SUTURE) ×3 IMPLANT
SUT SILK 3 0 SH CR/8 (SUTURE) ×3 IMPLANT
SUT SILK 3 0 TIES 10X30 (SUTURE) ×3 IMPLANT
SYR BULB IRRIGATION 50ML (SYRINGE) ×3 IMPLANT
TOWEL OR 17X26 10 PK STRL BLUE (TOWEL DISPOSABLE) ×6 IMPLANT
TRAY FOLEY CATH 14FRSI W/METER (CATHETERS) ×3 IMPLANT
TRAY PROCTOSCOPIC FIBER OPTIC (SET/KITS/TRAYS/PACK) IMPLANT
TUBE CONNECTING 12'X1/4 (SUCTIONS) ×1
TUBE CONNECTING 12X1/4 (SUCTIONS) ×2 IMPLANT
UNDERPAD 30X30 (UNDERPADS AND DIAPERS) ×6 IMPLANT
WATER STERILE IRR 1000ML POUR (IV SOLUTION) IMPLANT
YANKAUER SUCT BULB TIP NO VENT (SUCTIONS) ×6 IMPLANT

## 2016-10-03 NOTE — Progress Notes (Signed)
Trauma Service Note  Subjective: Patient agitated, but no LGI bleeding since day before yesterday.  Has  Nearly obstructing, bleeding ascending colonic mass on colonoscopy done yesterday.  Biopsy is pending.  No mass noted on CT done aon 12/5, but may have had some evidence of obstruction.  Objective: Vital signs in last 24 hours: Temp:  [97.7 F (36.5 C)-99.2 F (37.3 C)] 99 F (37.2 C) (12/27 0354) Pulse Rate:  [64-96] 70 (12/27 0500) Resp:  [15-26] 16 (12/27 0500) BP: (87-132)/(59-118) 123/69 (12/27 0500) SpO2:  [91 %-98 %] 92 % (12/27 0500) FiO2 (%):  [28 %-30 %] 30 % (12/27 0400) Weight:  [93.3 kg (205 lb 11 oz)] 93.3 kg (205 lb 11 oz) (12/27 0500) Last BM Date: 10/02/16  Intake/Output from previous day: 12/26 0701 - 12/27 0700 In: 1100 [I.V.:1100] Out: -  Intake/Output this shift: No intake/output data recorded.  General: No acute distress but agitated  Lungs: Clear.  Tach in place  Abd: Soft, minimally tender.  Not eating  Extremities: No changes  Neuro: Intact but c onfused and agitated.  Lab Results: CBC   Recent Labs  10/02/16 1414 10/03/16 0418  WBC 8.0 10.1  HGB 7.8* 9.1*  HCT 24.5* 29.1*  PLT 632* 625*   BMET  Recent Labs  10/01/16 0522 10/03/16 0418  NA 138 150*  K 3.7 3.1*  CL 105 117*  CO2 25 27  GLUCOSE 104* 106*  BUN 13 6  CREATININE 0.72 0.97  CALCIUM 8.8* 8.8*   PT/INR  Recent Labs  10/01/16 0522  LABPROT 13.9  INR 1.07   ABG No results for input(s): PHART, HCO3 in the last 72 hours.  Invalid input(s): PCO2, PO2  Studies/Results: Dg Abd Portable 1v  Result Date: 10/01/2016 CLINICAL DATA:  Nasogastric tube placement.  Initial encounter. EXAM: PORTABLE ABDOMEN - 1 VIEW COMPARISON:  Abdominal radiograph performed earlier today at 3:29 p.m. FINDINGS: The patient's enteric tube is noted ending overlying the body of the stomach. The visualized bowel gas pattern is grossly unremarkable. Clips are noted within the right upper  quadrant, reflecting prior cholecystectomy. No acute osseous abnormalities are seen. IMPRESSION: Enteric tube noted ending overlying the body of the stomach. Electronically Signed   By: Garald Balding M.D.   On: 10/01/2016 20:39   Dg Abd Portable 1v  Result Date: 10/01/2016 CLINICAL DATA:  Verify NG tube placement EXAM: PORTABLE ABDOMEN - 1 VIEW COMPARISON:  None. FINDINGS: NG tube tip is just beyond the gastroesophageal junction. Gas-filled loops of small and large bowel are noted. The stomach is presumably decompressed. There is no obvious free intraperitoneal gas. IMPRESSION: NG tube tip is just beyond the gastroesophageal junction in the proximal stomach. Electronically Signed   By: Marybelle Killings M.D.   On: 10/01/2016 15:45    Anti-infectives: Anti-infectives    Start     Dose/Rate Route Frequency Ordered Stop   09/24/16 1200  Ampicillin-Sulbactam (UNASYN) 3 g in sodium chloride 0.9 % 100 mL IVPB     3 g 200 mL/hr over 30 Minutes Intravenous Every 6 hours 09/24/16 1044 10/01/16 0116   09/22/16 1300  meropenem (MERREM) 2 g in sodium chloride 0.9 % 100 mL IVPB  Status:  Discontinued     2 g 200 mL/hr over 30 Minutes Intravenous Every 8 hours 09/22/16 1218 09/22/16 1226   09/22/16 1300  meropenem (MERREM) 1 g in sodium chloride 0.9 % 100 mL IVPB  Status:  Discontinued     1 g 200 mL/hr  over 30 Minutes Intravenous Every 8 hours 09/22/16 1226 09/24/16 1044   09/18/16 2200  ceFAZolin (ANCEF) IVPB 2g/100 mL premix  Status:  Discontinued     2 g 200 mL/hr over 30 Minutes Intravenous Every 8 hours 09/18/16 1500 09/22/16 1218   09/17/16 2300  vancomycin (VANCOCIN) IVPB 750 mg/150 ml premix  Status:  Discontinued     750 mg 150 mL/hr over 60 Minutes Intravenous Every 12 hours 09/17/16 1544 09/18/16 1500   09/17/16 2200  vancomycin (VANCOCIN) IVPB 750 mg/150 ml premix  Status:  Discontinued     750 mg 150 mL/hr over 60 Minutes Intravenous Every 12 hours 09/17/16 0909 09/17/16 1138   09/17/16 0915   vancomycin (VANCOCIN) 2,000 mg in sodium chloride 0.9 % 500 mL IVPB     2,000 mg 250 mL/hr over 120 Minutes Intravenous  Once 09/17/16 0907 09/17/16 1307   09/16/16 0800  piperacillin-tazobactam (ZOSYN) IVPB 3.375 g  Status:  Discontinued    Comments:  Zosyn 3.375 g IV q8h for CrCl > 10 mL/min   3.375 g 12.5 mL/hr over 240 Minutes Intravenous Every 8 hours 09/16/16 0735 09/18/16 1500      Assessment/Plan: s/p Procedure(s): COLONOSCOPY Patient needs urgery for obstructiong and bleeding colonic mass.  Will need to speak with the family about surgery, hopefully today.  NPO except sips with meds. Consent for surgery.  LOS: 22 days   Kathryne Eriksson. [name], MD, FACS [phone] Trauma Surgeon 10/03/2016

## 2016-10-03 NOTE — Op Note (Signed)
OPERATIVE REPORT  DATE OF OPERATION: 09/11/2016 - 10/03/2016  PATIENT:  Jacqueline [name]  50 y.o. 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  PROCEDURE:  Procedure(s): Right hemicolectomy  Excisional biopsy of right hepatic mass  SURGEON:  Surgeon(s): Judeth Horn, MD Excell Seltzer, MD  ASSISTANT: Excell Seltzer, MD  ANESTHESIA:   general  COMPLICATIONS:  None  EBL: 100 ml  BLOOD ADMINISTERED: none  DRAINS: Urinary Catheter (Foley)   SPECIMEN:  Source of Specimen:  Terminal ileum, right colon to right transverse colon  COUNTS CORRECT:  YES  PROCEDURE DETAILS: The patient was taken to the operating room and placed on the table in the supine position. After an adequate general endotracheal anesthetic was administered, she was prepped and draped in usual sterile manner exposing her midline of the abdomen.  A proper timeout was performed identifying the patient and procedure to be performed. A midline incision was made using a #10 blade and taken down to the midline fascia. We incised the midline fascia using electrocautery then opened up inferiorly and subsequently had extended proximally through the patient's previous midline incision.  Omental adhesions to the anterior abdominal wall which had to be addressed with electrocautery and Metzenbaum scissors for moving away for the entire procedure. Once these adhesions were taken down we palpated for evidence of tumor in the right colon. There was no immediately palpable tumor however both upon mobilizing the right colon at the line of Toldt on the right  side we came upon a very dense, hard, and adherent mass in the right upper quadrant at the hepatic flexure. Care was taken to mobilize it from the right paracolic gutter and also from the right transverse colon where the omentum and been taken down and we were able to mobilize it up towards the hepatic fossa. The patient had a previous cholecystectomy and there were significant adhesions to the liver and that area.  A LigaSure device was used in order to help mobilize this area. Also blunt dissection by the surgeon in getting on top of the part that was adherent to the posterior lobe of the liver allowed [name] to push it down and ultimately get around this tumor. Doing so it was noted that the entire anti-mesenteric wall of the right colon was necrotic and near perforation. Once we had adequately mobilize we came across the terminal ileum and the right transverse colon using a GIA-75 stapler. Terry between was taken with a LigaSure device with the exception of the right colic artery which is taken between a Kelly clamp and a 2-0 silk tie.  The specimen was freed up and opened off the field showing they'll a significant full-thickness necrosis in that area but no obvious tumor although there were some firm edges of the bowel mucosa.  The surgeon changed gowns and gloves and went back and an irrigated with saline solution. It was noted that the patient had a firm mass on the anterior surface of the liver which is taken with a #15 blade and sent off as a specimen. Was approximately 1 cm in diameter did not appear to go very deep. There were no other palpable masses on the liver at all.  Eating from the liver biopsy was controlled with electrocautery and Surgicel. Once this was done we performed a side-to-side anastomosis between the distal ileum and the proximal right transverse colon using a GIA-75 stapler. The resulting expected enterotomy was closed using a TX 60 stapler. The mesentery was closed using  interrupted 2-0 silk sutures. Care was taken not to twist the bowel during the anastomosis all completion of the mesenteric repair.  We subsequently inspected the liver biopsy area and there was no bleeding. Surgicel was left in place. We irrigated with 3 L of saline solution then the surgeon changed gowns gloving came back Endo Close.  The small bowel was run all the way up to the ligament of Treitz. We could not palpate any other areas of pathology or apparent metastasis. Because of previous upper abdominal surgery could not palpate the stomach.  Subsequently closed using looped #1 PDS suture. The subcutaneous tissue was irrigated with saline then we closed the skin loosely with stainless steel staples. Telfa wicks were placed between the staples. A sterile dressing of the honeycomb type was used to complete the dressing. All needle counts, sponge counts, and instrument counts were correct.  PATIENT DISPOSITION:  PACU - hemodynamically stable.   Paxon Propes 12/27/20172:09 PM

## 2016-10-03 NOTE — Progress Notes (Signed)
Peripherally Inserted Central Catheter/Midline Placement  The IV Nurse has discussed with the patient and/or persons authorized to consent for the patient, the purpose of this procedure and the potential benefits and risks involved with this procedure.  The benefits include less needle sticks, lab draws from the catheter, and the patient may be discharged home with the catheter. Risks include, but not limited to, infection, bleeding, blood clot (thrombus formation), and puncture of an artery; nerve damage and irregular heartbeat and possibility to perform a PICC exchange if needed/ordered by physician.  Alternatives to this procedure were also discussed.  Bard Power PICC patient education guide, fact sheet on infection prevention and patient information card has been provided to patient /or left at bedside.    PICC/Midline Placement Documentation    Telephone consent by Mother  Synthia Innocent 10/03/2016, 9:21 AM

## 2016-10-03 NOTE — Progress Notes (Signed)
Dr Richarda Blade operative note reviewed.  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 No malignancy found.  Cause unclear ? Ischemia. Right hemicolectomy done. I rec'd a call from pathology today after the patient was already in the OR - no malignancy seen.   This process was the source of LGI bleeding.  We will sign off - call as need arises.  Wilfrid Lund, MD [facility]

## 2016-10-03 NOTE — Progress Notes (Signed)
PT Cancellation Note  Patient Details Name: Jacqueline [name] MRN: [id number] DOB: 08-02-1966   Cancelled Treatment:    Reason Eval/Treat Not Completed: Medical issues which prohibited therapy.  Pending emergent surgery for bleeding, obstructing, colonic mass per OT note MD states hopefully today.  PT will check back tomorrow.    Thanks,    Barbarann Ehlers. Debbe Crumble, PT, DPT [phone]   10/03/2016, 9:50 AM

## 2016-10-03 NOTE — Anesthesia Preprocedure Evaluation (Addendum)
Anesthesia Evaluation  Patient identified by MRN, date of birth, ID band Patient confused    Reviewed: Allergy & Precautions, NPO status , Patient's Chart, lab work & pertinent test results, Unable to perform ROS - Chart review only  Airway Mallampati: Trach   Neck ROM: Limited    Dental  (+) Dental Advisory Given   Pulmonary Current Smoker,    breath sounds clear to auscultation       Cardiovascular  Rhythm:Regular     Neuro/Psych Seizures -,  PSYCHIATRIC DISORDERS Anxiety Depression Bipolar Disorder    GI/Hepatic PUD, Gi bleed   Endo/Other  Hypothyroidism   Renal/GU      Musculoskeletal   Abdominal   Peds  Hematology  (+) anemia ,   Anesthesia Other Findings   Reproductive/Obstetrics                             Anesthesia Physical Anesthesia Plan  ASA: III  Anesthesia Plan: General   Post-op Pain Management:    Induction: Intravenous  Airway Management Planned: Tracheostomy  Additional Equipment: None  Intra-op Plan:   Post-operative Plan: Possible Post-op intubation/ventilation  Informed Consent: I have reviewed the patients History and Physical, chart, labs and discussed the procedure including the risks, benefits and alternatives for the proposed anesthesia with the patient or authorized representative who has indicated his/her understanding and acceptance.   Dental advisory given  Plan Discussed with: CRNA and Surgeon  Anesthesia Plan Comments:        Anesthesia Quick Evaluation

## 2016-10-03 NOTE — Progress Notes (Signed)
OT Cancellation Note  Patient Details Name: Jacqueline [name] MRN: [id number] DOB: October 12, 1965   Cancelled Treatment:    Reason Eval/Treat Not Completed: Patient not medically ready (pending surg today per Dr Hulen Skains)  Vonita Moss   OTR/L Pager: [phone] Office: [phone] .  10/03/2016, 8:10 AM

## 2016-10-03 NOTE — Transfer of Care (Signed)
Immediate Anesthesia Transfer of Care Note  Patient: Jacqueline [name]  Procedure(s) Performed: Procedure(s): PARTIAL COLECTOMY (Right)  Patient Location: ICU  Anesthesia Type:General  Level of Consciousness: sedated and Patient remains intubated per anesthesia plan  Airway & Oxygen Therapy: Patient remains intubated per anesthesia plan and Patient placed on Ventilator (see vital sign flow sheet for setting)  Post-op Assessment: Report given to RN and Post -op Vital signs reviewed and stable  Post vital signs: Reviewed and stable  Last Vitals:  Vitals:   10/03/16 0805 10/03/16 0813  BP: (!) 133/94   Pulse: 92   Resp: 15   Temp:  37.1 C    Last Pain:  Vitals:   10/03/16 0835  TempSrc:   PainSc: 5          Complications: No apparent anesthesia complications
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
## 2016-10-03 NOTE — Progress Notes (Signed)
Placed patient back on 28% trach collar. Sp02=99% no signs of increased work of breathing.

## 2016-10-03 NOTE — Progress Notes (Addendum)
Patient went to OR on trach collar however patient returned from OR being bagged by CRNA.  Placed patient on ventilator, on patient 8cc.  Will continue to monitor.

## 2016-10-04 ENCOUNTER — Encounter (HOSPITAL_COMMUNITY): Payer: Self-pay | Admitting: General Surgery

## 2016-10-04 LAB — GLUCOSE, CAPILLARY
GLUCOSE-CAPILLARY: 141 mg/dL — AB (ref 65–99)
GLUCOSE-CAPILLARY: 94 mg/dL (ref 65–99)
Glucose-Capillary: 102 mg/dL — ABNORMAL HIGH (ref 65–99)
Glucose-Capillary: 107 mg/dL — ABNORMAL HIGH (ref 65–99)
Glucose-Capillary: 117 mg/dL — ABNORMAL HIGH (ref 65–99)
Glucose-Capillary: 125 mg/dL — ABNORMAL HIGH (ref 65–99)

## 2016-10-04 LAB — BASIC METABOLIC PANEL
ANION GAP: 6 (ref 5–15)
BUN: 8 mg/dL (ref 6–20)
CALCIUM: 8 mg/dL — AB (ref 8.9–10.3)
CO2: 24 mmol/L (ref 22–32)
Chloride: 115 mmol/L — ABNORMAL HIGH (ref 101–111)
Creatinine, Ser: 1.13 mg/dL — ABNORMAL HIGH (ref 0.44–1.00)
GFR, EST NON AFRICAN AMERICAN: 57 mL/min — AB (ref >60)
Glucose, Bld: 128 mg/dL — ABNORMAL HIGH (ref 65–99)
Potassium: 3.3 mmol/L — ABNORMAL LOW (ref 3.5–5.1)
Sodium: 145 mmol/L (ref 135–145)

## 2016-10-04 LAB — TYPE AND SCREEN
ABO/RH(D): O POS
ANTIBODY SCREEN: POSITIVE
DAT, IgG: NEGATIVE
DONOR AG TYPE: NEGATIVE
DONOR AG TYPE: NEGATIVE
DONOR AG TYPE: NEGATIVE
Donor AG Type: NEGATIVE
UNIT DIVISION: 0
UNIT DIVISION: 0
UNIT DIVISION: 0
Unit division: 0

## 2016-10-04 LAB — CBC
HCT: 25.5 % — ABNORMAL LOW (ref 36.0–46.0)
HCT: 23.4 % — ABNORMAL LOW (ref 36.0–46.0)
Hemoglobin: 8.3 g/dL — ABNORMAL LOW (ref 12.0–15.0)
Hemoglobin: 7.2 g/dL — ABNORMAL LOW (ref 12.0–15.0)
MCH: 30.6 pg (ref 26.0–34.0)
MCH: 29.8 pg (ref 26.0–34.0)
MCHC: 32.5 g/dL (ref 30.0–36.0)
MCHC: 30.8 g/dL (ref 30.0–36.0)
MCV: 94.1 fL (ref 78.0–100.0)
MCV: 96.7 fL (ref 78.0–100.0)
PLATELETS: 507 10*3/uL — AB (ref 150–400)
PLATELETS: 425 10*3/uL — AB (ref 150–400)
RBC: 2.71 MIL/uL — ABNORMAL LOW (ref 3.87–5.11)
RBC: 2.42 MIL/uL — ABNORMAL LOW (ref 3.87–5.11)
RDW: 22.3 % — AB (ref 11.5–15.5)
RDW: 22.4 % — AB (ref 11.5–15.5)
WBC: 20.3 10*3/uL — AB (ref 4.0–10.5)
WBC: 18 10*3/uL — AB (ref 4.0–10.5)

## 2016-10-04 LAB — CEA: CEA: 2.3 ng/mL (ref 0.0–4.7)

## 2016-10-04 LAB — MAGNESIUM: MAGNESIUM: 1.8 mg/dL (ref 1.7–2.4)

## 2016-10-04 LAB — TRIGLYCERIDES: Triglycerides: 53 mg/dL (ref <150)

## 2016-10-04 MED ORDER — ACETAMINOPHEN 325 MG PO TABS
650.0000 mg | ORAL_TABLET | Freq: Once | ORAL | Status: AC
  Administered 2016-10-04: 650 mg via ORAL
  Filled 2016-10-04: qty 2

## 2016-10-04 MED ORDER — SODIUM CHLORIDE 0.9 % IV SOLN
30.0000 meq | Freq: Once | INTRAVENOUS | Status: AC
  Administered 2016-10-04: 30 meq via INTRAVENOUS
  Filled 2016-10-04: qty 15

## 2016-10-04 MED ORDER — SODIUM CHLORIDE 0.9 % IV BOLUS (SEPSIS)
1000.0000 mL | Freq: Once | INTRAVENOUS | Status: AC
  Administered 2016-10-04: 1000 mL via INTRAVENOUS

## 2016-10-04 MED ORDER — CLONAZEPAM 1 MG PO TABS
1.0000 mg | ORAL_TABLET | Freq: Two times a day (BID) | ORAL | Status: DC
  Administered 2016-10-04 – 2016-10-08 (×9): 1 mg via ORAL
  Filled 2016-10-04 (×9): qty 1
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
## 2016-10-04 NOTE — Anesthesia Postprocedure Evaluation (Signed)
Anesthesia Post Note  Patient: Jacqueline [name]  Procedure(s) Performed: Procedure(s) (LRB): PARTIAL COLECTOMY (Right)  Patient location during evaluation: ICU Anesthesia Type: General Level of consciousness: sedated Pain management: pain level controlled Vital Signs Assessment: post-procedure vital signs reviewed and stable Respiratory status: patient remains intubated per anesthesia plan (trach) Cardiovascular status: stable Postop Assessment: no signs of nausea or vomiting Anesthetic complications: no       Last Vitals:  Vitals:   10/04/16 0906 10/04/16 1156  BP:    Pulse:    Resp:    Temp: 37.5 C 37.2 C    Last Pain:  Vitals:   10/04/16 1156  TempSrc: Oral  PainSc:                  Annaelle Kasel

## 2016-10-04 NOTE — Progress Notes (Signed)
Rehab admissions - Continuing to follow for potential acute inpatient rehab admission once she is medically ready.  Call me for questions.  RC:[phone]

## 2016-10-04 NOTE — Progress Notes (Signed)
Trach changed from #6 cuffed shiley to #4 cuffless Shiley per MD order. Good color change noted on EZ cap and bilateral breath sounds. No complications noted. RN at bedside.

## 2016-10-04 NOTE — Progress Notes (Signed)
Pt heart rate in the 120-130s sustained. BP stable. Notified MD. New orders given. Will continue to monitor.

## 2016-10-04 NOTE — Consult Note (Addendum)
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 Wound has decreased slightly in size and depth since previous assessment. Wound type: Pressure Pressure Ulcer POA: No Measurement: 1cm x 2.5cm x 0.2cm Wound bed: red and moist Drainage (amount, consistency, odor) moderate amount of tan drainage leaking around trach insertion site; it is difficult to keep wound from becoming soiled. Dressing procedure/placement/frequency: Aquacel to absorb drainage and provide antimicrobial benefits, foam dressing to decrease pressure to the affected area. Please re-consult if further assistance is needed.  Thank-you,  Julien Girt MSN, [place], [place], [place], [place]

## 2016-10-04 NOTE — Progress Notes (Signed)
Trauma Service Note  Subjective: No events, put back on trach collar overnight. Pain well controlled.   Objective: Vital signs in last 24 hours: Temp:  [97.5 F (36.4 C)-100.9 F (38.3 C)] 100.9 F (38.3 C) (12/28 0358) Pulse Rate:  [72-120] 108 (12/28 0633) Resp:  [12-34] 19 (12/28 0633) BP: (85-157)/(50-102) 100/59 (12/28 0600) SpO2:  [92 %-100 %] 96 % (12/28 [id number]) Arterial Line BP: (89-121)/(43-83) 106/83 (12/27 1615) FiO2 (%):  [28 %-40 %] 28 % (12/28 [id number]) Weight:  [92.7 kg (204 lb 5.9 oz)] 92.7 kg (204 lb 5.9 oz) (12/28 0500) Last BM Date: 10/03/16  Intake/Output from previous day: 12/27 0701 - 12/28 0700 In: 4416.4 [I.V.:3041.4; IV Piggyback:800] Out: 1170 [Urine:1070; Blood:100] Intake/Output this shift: No intake/output data recorded.  General: No acute distress, restless  Lungs: Clear.  Trach in place  Abd: Soft, minimally tender, minimally distended. OR dressing intact.  Extremities: No changes  Neuro: Intact but confused.  Lab Results: CBC   Recent Labs  10/03/16 0418 10/03/16 1414 10/03/16 1715  WBC 10.1  --  15.7*  HGB 9.1* 8.2* 8.7*  HCT 29.1* 24.0* 27.6*  PLT 625*  --  805*   BMET  Recent Labs  10/03/16 0418 10/03/16 1414  NA 150* 149*  K 3.1* 3.1*  CL 117*  --   CO2 27  --   GLUCOSE 106*  --   BUN 6  --   CREATININE 0.97  --   CALCIUM 8.8*  --    PT/INR No results for input(s): LABPROT, INR in the last 72 hours. ABG  Recent Labs  10/03/16 1414  PHART 7.407  HCO3 22.3    Studies/Results: Dg Abd Portable 1v  Result Date: 10/04/2016 CLINICAL DATA:  Nasogastric tube placement. EXAM: PORTABLE ABDOMEN - 1 VIEW COMPARISON:  10/01/2016 FINDINGS: Tip and side port of the enteric tube below the diaphragm in the stomach. No bowel dilatation to suggest obstruction. No evidence of free air. Cholecystectomy clips right upper quadrant. IMPRESSION: Tip and side port of the enteric tube below the diaphragm in the stomach. Electronically  Signed   By: Jeb Levering M.D.   On: 10/04/2016 00:11    Anti-infectives: Anti-infectives    Start     Dose/Rate Route Frequency Ordered Stop   10/03/16 0900  cefoTEtan (CEFOTAN) 2 g in dextrose 5 % 50 mL IVPB     2 g 100 mL/hr over 30 Minutes Intravenous To [facility] 10/03/16 0753 10/03/16 1212   09/24/16 1200  Ampicillin-Sulbactam (UNASYN) 3 g in sodium chloride 0.9 % 100 mL IVPB     3 g 200 mL/hr over 30 Minutes Intravenous Every 6 hours 09/24/16 1044 10/01/16 0116   09/22/16 1300  meropenem (MERREM) 2 g in sodium chloride 0.9 % 100 mL IVPB  Status:  Discontinued     2 g 200 mL/hr over 30 Minutes Intravenous Every 8 hours 09/22/16 1218 09/22/16 1226   09/22/16 1300  meropenem (MERREM) 1 g in sodium chloride 0.9 % 100 mL IVPB  Status:  Discontinued     1 g 200 mL/hr over 30 Minutes Intravenous Every 8 hours 09/22/16 1226 09/24/16 1044   09/18/16 2200  ceFAZolin (ANCEF) IVPB 2g/100 mL premix  Status:  Discontinued     2 g 200 mL/hr over 30 Minutes Intravenous Every 8 hours 09/18/16 1500 09/22/16 1218   09/17/16 2300  vancomycin (VANCOCIN) IVPB 750 mg/150 ml premix  Status:  Discontinued     750 mg 150 mL/hr over 60  Minutes Intravenous Every 12 hours 09/17/16 1544 09/18/16 1500   09/17/16 2200  vancomycin (VANCOCIN) IVPB 750 mg/150 ml premix  Status:  Discontinued     750 mg 150 mL/hr over 60 Minutes Intravenous Every 12 hours 09/17/16 0909 09/17/16 1138   09/17/16 0915  vancomycin (VANCOCIN) 2,000 mg in sodium chloride 0.9 % 500 mL IVPB     2,000 mg 250 mL/hr over 120 Minutes Intravenous  Once 09/17/16 0907 09/17/16 1307   09/16/16 0800  piperacillin-tazobactam (ZOSYN) IVPB 3.375 g  Status:  Discontinued    Comments:  Zosyn 3.375 g IV q8h for CrCl > 10 mL/min   3.375 g 12.5 mL/hr over 240 Minutes Intravenous Every 8 hours 09/16/16 0735 09/18/16 1500      Assessment/Plan: s/p open right hemicolectomy 10/03/16, Dr. Hulen Skains -Downsize trach back to 4cuffless -If able  to swallow resume PO meds and advance to clear liquid diet -Labs today and in AM -OOB/PT -Start to wean klonopin- dose reduced to 1mg  BID to start.    LOS: 23 days    [facility] 10/04/2016
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
## 2016-10-05 LAB — GLUCOSE, CAPILLARY
GLUCOSE-CAPILLARY: 95 mg/dL (ref 65–99)
GLUCOSE-CAPILLARY: 104 mg/dL — AB (ref 65–99)
GLUCOSE-CAPILLARY: 91 mg/dL (ref 65–99)
Glucose-Capillary: 95 mg/dL (ref 65–99)
Glucose-Capillary: 116 mg/dL — ABNORMAL HIGH (ref 65–99)

## 2016-10-05 LAB — CBC
HEMATOCRIT: 22.9 % — AB (ref 36.0–46.0)
Hemoglobin: 7.3 g/dL — ABNORMAL LOW (ref 12.0–15.0)
MCH: 30.3 pg (ref 26.0–34.0)
MCHC: 31.9 g/dL (ref 30.0–36.0)
MCV: 95 fL (ref 78.0–100.0)
PLATELETS: 437 10*3/uL — AB (ref 150–400)
RBC: 2.41 MIL/uL — AB (ref 3.87–5.11)
RDW: 22.1 % — AB (ref 11.5–15.5)
WBC: 18.9 10*3/uL — ABNORMAL HIGH (ref 4.0–10.5)

## 2016-10-05 LAB — BASIC METABOLIC PANEL
Anion gap: 4 — ABNORMAL LOW (ref 5–15)
BUN: 8 mg/dL (ref 6–20)
CHLORIDE: 116 mmol/L — AB (ref 101–111)
CO2: 25 mmol/L (ref 22–32)
CREATININE: 1.02 mg/dL — AB (ref 0.44–1.00)
Calcium: 8.1 mg/dL — ABNORMAL LOW (ref 8.9–10.3)
GFR calc Af Amer: >60 mL/min (ref >60)
GFR calc non Af Amer: >60 mL/min (ref >60)
Glucose, Bld: 116 mg/dL — ABNORMAL HIGH (ref 65–99)
POTASSIUM: 3.5 mmol/L (ref 3.5–5.1)
Sodium: 145 mmol/L (ref 135–145)

## 2016-10-05 MED ORDER — HYDROMORPHONE HCL 1 MG/ML IJ SOLN
0.5000 mg | INTRAMUSCULAR | Status: DC | PRN
  Administered 2016-10-05: 2 mg via INTRAVENOUS
  Administered 2016-10-05: 1.5 mg via INTRAVENOUS
  Administered 2016-10-06: 1 mg via INTRAVENOUS
  Administered 2016-10-06 (×2): 2 mg via INTRAVENOUS
  Administered 2016-10-07: 1 mg via INTRAVENOUS
  Administered 2016-10-07: 2 mg via INTRAVENOUS
  Administered 2016-10-08 – 2016-10-09 (×7): 1 mg via INTRAVENOUS
  Filled 2016-10-05 (×2): qty 2
  Filled 2016-10-05 (×2): qty 1
  Filled 2016-10-05: qty 2
  Filled 2016-10-05 (×5): qty 1
  Filled 2016-10-05 (×3): qty 2
  Filled 2016-10-05 (×2): qty 1

## 2016-10-05 MED ORDER — SODIUM CHLORIDE 0.9 % IV BOLUS (SEPSIS)
500.0000 mL | Freq: Once | INTRAVENOUS | Status: AC
  Administered 2016-10-05: 500 mL via INTRAVENOUS

## 2016-10-05 NOTE — Progress Notes (Signed)
Rehab admissions - I spoke with Dr. Hulen Skains and Almyra Free, case manager.  I will follow progress over the next 3 days and then check back on Tuesday.  Awaiting medical readiness and consistent therapy participation prior to inpatient rehab admission.  Call me for questions.  [phone]
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
## 2016-10-05 NOTE — Progress Notes (Signed)
Trauma Service Note  Subjective: Patient is off the ventilator and her trach has been downsized to #4 cuffless.  She is in her usual state of confusion and moderate agitation.  Objective: Vital signs in last 24 hours: Temp:  [97.9 F (36.6 C)-101 F (38.3 C)] 97.9 F (36.6 C) (12/29 0350) Pulse Rate:  [98-130] 112 (12/29 0600) Resp:  [15-38] 18 (12/29 0600) BP: (101-126)/(52-99) 113/69 (12/29 0600) SpO2:  [94 %-99 %] 96 % (12/29 0600) FiO2 (%):  [28 %] 28 % (12/29 0433) Weight:  [90.4 kg (199 lb 4.7 oz)] 90.4 kg (199 lb 4.7 oz) (12/29 0327) Last BM Date: 10/01/16  Intake/Output from previous day: 12/28 0701 - 12/29 0700 In: 3600 [I.V.:2300] Out: 1900 [Urine:1900] Intake/Output this shift: No intake/output data recorded.  General: No acute distress  Lungs: Clear.  Oxygenation is good.  Abd: Mildly tender in the RUQ.  Midline wound with wicks and some drainage, but not purulent.  Extremities: No changes.  No clinical signs or symptoms of DVT  Neuro: Confusion and mildly agitated.  Lab Results: CBC   Recent Labs  10/04/16 2315 10/05/16 0318  WBC 18.0* 18.9*  HGB 7.2* 7.3*  HCT 23.4* 22.9*  PLT 425* 437*   BMET  Recent Labs  10/04/16 1030 10/05/16 0318  NA 145 145  K 3.3* 3.5  CL 115* 116*  CO2 24 25  GLUCOSE 128* 116*  BUN 8 8  CREATININE 1.13* 1.02*  CALCIUM 8.0* 8.1*   PT/INR No results for input(s): LABPROT, INR in the last 72 hours. ABG  Recent Labs  10/03/16 1414  PHART 7.407  HCO3 22.3    Studies/Results: Dg Abd Portable 1v  Result Date: 10/04/2016 CLINICAL DATA:  Nasogastric tube placement. EXAM: PORTABLE ABDOMEN - 1 VIEW COMPARISON:  10/01/2016 FINDINGS: Tip and side port of the enteric tube below the diaphragm in the stomach. No bowel dilatation to suggest obstruction. No evidence of free air. Cholecystectomy clips right upper quadrant. IMPRESSION: Tip and side port of the enteric tube below the diaphragm in the stomach.  Electronically Signed   By: Jeb Levering M.D.   On: 10/04/2016 00:11    Anti-infectives: Anti-infectives    Start     Dose/Rate Route Frequency Ordered Stop   10/03/16 0900  cefoTEtan (CEFOTAN) 2 g in dextrose 5 % 50 mL IVPB     2 g 100 mL/hr over 30 Minutes Intravenous To ShortStay Surgical 10/03/16 0753 10/03/16 1212   09/24/16 1200  Ampicillin-Sulbactam (UNASYN) 3 g in sodium chloride 0.9 % 100 mL IVPB     3 g 200 mL/hr over 30 Minutes Intravenous Every 6 hours 09/24/16 1044 10/01/16 0116   09/22/16 1300  meropenem (MERREM) 2 g in sodium chloride 0.9 % 100 mL IVPB  Status:  Discontinued     2 g 200 mL/hr over 30 Minutes Intravenous Every 8 hours 09/22/16 1218 09/22/16 1226   09/22/16 1300  meropenem (MERREM) 1 g in sodium chloride 0.9 % 100 mL IVPB  Status:  Discontinued     1 g 200 mL/hr over 30 Minutes Intravenous Every 8 hours 09/22/16 1226 09/24/16 1044   09/18/16 2200  ceFAZolin (ANCEF) IVPB 2g/100 mL premix  Status:  Discontinued     2 g 200 mL/hr over 30 Minutes Intravenous Every 8 hours 09/18/16 1500 09/22/16 1218   09/17/16 2300  vancomycin (VANCOCIN) IVPB 750 mg/150 ml premix  Status:  Discontinued     750 mg 150 mL/hr over 60 Minutes Intravenous Every  12 hours 09/17/16 1544 09/18/16 1500   09/17/16 2200  vancomycin (VANCOCIN) IVPB 750 mg/150 ml premix  Status:  Discontinued     750 mg 150 mL/hr over 60 Minutes Intravenous Every 12 hours 09/17/16 0909 09/17/16 1138   09/17/16 0915  vancomycin (VANCOCIN) 2,000 mg in sodium chloride 0.9 % 500 mL IVPB     2,000 mg 250 mL/hr over 120 Minutes Intravenous  Once 09/17/16 0907 09/17/16 1307   09/16/16 0800  piperacillin-tazobactam (ZOSYN) IVPB 3.375 g  Status:  Discontinued    Comments:  Zosyn 3.375 g IV q8h for CrCl > 10 mL/min   3.375 g 12.5 mL/hr over 240 Minutes Intravenous Every 8 hours 09/16/16 0735 09/18/16 1500      Assessment/Plan: s/p Procedure(s): PARTIAL COLECTOMY Advance diet  Clear liquids.  LOS: 24  days   Kathryne Eriksson. [name], MD, FACS [phone] Trauma Surgeon 10/05/2016

## 2016-10-05 NOTE — Progress Notes (Signed)
Patient transported via bed to [place] [facility].

## 2016-10-06 LAB — GLUCOSE, CAPILLARY
GLUCOSE-CAPILLARY: 102 mg/dL — AB (ref 65–99)
GLUCOSE-CAPILLARY: 112 mg/dL — AB (ref 65–99)
GLUCOSE-CAPILLARY: 166 mg/dL — AB (ref 65–99)
GLUCOSE-CAPILLARY: 92 mg/dL (ref 65–99)
Glucose-Capillary: 109 mg/dL — ABNORMAL HIGH (ref 65–99)
Glucose-Capillary: 99 mg/dL (ref 65–99)

## 2016-10-06 LAB — BASIC METABOLIC PANEL
ANION GAP: 4 — AB (ref 5–15)
BUN: 7 mg/dL (ref 6–20)
CHLORIDE: 116 mmol/L — AB (ref 101–111)
CO2: 26 mmol/L (ref 22–32)
CREATININE: 0.86 mg/dL (ref 0.44–1.00)
Calcium: 8.1 mg/dL — ABNORMAL LOW (ref 8.9–10.3)
GFR calc non Af Amer: >60 mL/min (ref >60)
GLUCOSE: 107 mg/dL — AB (ref 65–99)
Potassium: 3 mmol/L — ABNORMAL LOW (ref 3.5–5.1)
Sodium: 146 mmol/L — ABNORMAL HIGH (ref 135–145)

## 2016-10-06 LAB — CBC WITH DIFFERENTIAL/PLATELET
BASOS ABS: 0 10*3/uL (ref 0.0–0.1)
Basophils Relative: 0 %
EOS PCT: 4 %
Eosinophils Absolute: 0.5 10*3/uL (ref 0.0–0.7)
HEMATOCRIT: 21 % — AB (ref 36.0–46.0)
HEMOGLOBIN: 6.7 g/dL — AB (ref 12.0–15.0)
LYMPHS ABS: 1.4 10*3/uL (ref 0.7–4.0)
LYMPHS PCT: 11 %
MCH: 30.9 pg (ref 26.0–34.0)
MCHC: 31.9 g/dL (ref 30.0–36.0)
MCV: 96.8 fL (ref 78.0–100.0)
MONOS PCT: 8 %
Monocytes Absolute: 1 10*3/uL (ref 0.1–1.0)
NEUTROS ABS: 10.2 10*3/uL — AB (ref 1.7–7.7)
Neutrophils Relative %: 77 %
Platelets: 327 10*3/uL (ref 150–400)
RBC: 2.17 MIL/uL — ABNORMAL LOW (ref 3.87–5.11)
RDW: 21.8 % — ABNORMAL HIGH (ref 11.5–15.5)
WBC: 13.1 10*3/uL — ABNORMAL HIGH (ref 4.0–10.5)

## 2016-10-06 LAB — TRIGLYCERIDES: Triglycerides: 82 mg/dL (ref <150)

## 2016-10-06 LAB — PREPARE RBC (CROSSMATCH)

## 2016-10-06 MED ORDER — SODIUM CHLORIDE 0.9 % IV SOLN: Freq: Once | INTRAVENOUS | Status: DC

## 2016-10-06 MED ORDER — POTASSIUM CHLORIDE CRYS ER 20 MEQ PO TBCR
40.0000 meq | EXTENDED_RELEASE_TABLET | Freq: Three times a day (TID) | ORAL | Status: AC
  Administered 2016-10-06 (×3): 40 meq via ORAL
  Filled 2016-10-06 (×3): qty 2

## 2016-10-06 NOTE — Progress Notes (Signed)
3 Days Post-Op  Subjective: Awake and alert, continues to be confused  Objective: Vital signs in last 24 hours: Temp:  [97.6 F (36.4 C)-99.5 F (37.5 C)] 98.1 F (36.7 C) (12/30 0343) Pulse Rate:  [110-131] 115 (12/30 0551) Resp:  [18-32] 32 (12/30 0551) BP: (96-131)/(52-84) 97/67 (12/30 0343) SpO2:  [92 %-100 %] 100 % (12/30 0551) FiO2 (%):  [28 %] 28 % (12/30 0720) Weight:  [85.9 kg (189 lb 6 oz)-92.2 kg (203 lb 4.2 oz)] 85.9 kg (189 lb 6 oz) (12/30 0343) Last BM Date: 10/06/16  Intake/Output from previous day: 12/29 0701 - 12/30 0700 In: 2362.9 [P.O.:240; I.V.:2122.9] Out: 2050 [Urine:2050] Intake/Output this shift: Total I/O In: 75 [I.V.:75] Out: -   Exam: Awake and alert Lungs clear CV tachy Abdomen soft, non distended, incision clean  Lab Results:   Recent Labs  10/05/16 0318 10/06/16 0500  WBC 18.9* 13.1*  HGB 7.3* 6.7*  HCT 22.9* 21.0*  PLT 437* 327   BMET  Recent Labs  10/05/16 0318 10/06/16 0500  NA 145 146*  K 3.5 3.0*  CL 116* 116*  CO2 25 26  GLUCOSE 116* 107*  BUN 8 7  CREATININE 1.02* 0.86  CALCIUM 8.1* 8.1*   PT/INR No results for input(s): LABPROT, INR in the last 72 hours. ABG  Recent Labs  10/03/16 1414  PHART 7.407  HCO3 22.3    Studies/Results: No results found.  Anti-infectives: Anti-infectives    Start     Dose/Rate Route Frequency Ordered Stop   10/03/16 0900  cefoTEtan (CEFOTAN) 2 g in dextrose 5 % 50 mL IVPB     2 g 100 mL/hr over 30 Minutes Intravenous To ShortStay Surgical 10/03/16 0753 10/03/16 1212   09/24/16 1200  Ampicillin-Sulbactam (UNASYN) 3 g in sodium chloride 0.9 % 100 mL IVPB     3 g 200 mL/hr over 30 Minutes Intravenous Every 6 hours 09/24/16 1044 10/01/16 0116   09/22/16 1300  meropenem (MERREM) 2 g in sodium chloride 0.9 % 100 mL IVPB  Status:  Discontinued     2 g 200 mL/hr over 30 Minutes Intravenous Every 8 hours 09/22/16 1218 09/22/16 1226   09/22/16 1300  meropenem (MERREM) 1 g in  sodium chloride 0.9 % 100 mL IVPB  Status:  Discontinued     1 g 200 mL/hr over 30 Minutes Intravenous Every 8 hours 09/22/16 1226 09/24/16 1044   09/18/16 2200  ceFAZolin (ANCEF) IVPB 2g/100 mL premix  Status:  Discontinued     2 g 200 mL/hr over 30 Minutes Intravenous Every 8 hours 09/18/16 1500 09/22/16 1218   09/17/16 2300  vancomycin (VANCOCIN) IVPB 750 mg/150 ml premix  Status:  Discontinued     750 mg 150 mL/hr over 60 Minutes Intravenous Every 12 hours 09/17/16 1544 09/18/16 1500   09/17/16 2200  vancomycin (VANCOCIN) IVPB 750 mg/150 ml premix  Status:  Discontinued     750 mg 150 mL/hr over 60 Minutes Intravenous Every 12 hours 09/17/16 0909 09/17/16 1138   09/17/16 0915  vancomycin (VANCOCIN) 2,000 mg in sodium chloride 0.9 % 500 mL IVPB     2,000 mg 250 mL/hr over 120 Minutes Intravenous  Once 09/17/16 0907 09/17/16 1307   09/16/16 0800  piperacillin-tazobactam (ZOSYN) IVPB 3.375 g  Status:  Discontinued    Comments:  Zosyn 3.375 g IV q8h for CrCl > 10 mL/min   3.375 g 12.5 mL/hr over 240 Minutes Intravenous Every 8 hours 09/16/16 0735 09/18/16 1500  Assessment/Plan: s/p Procedure(s): PARTIAL COLECTOMY (Right)  H/H down.  Chronic and post op blood loss anemia Will transfuse 2 units PRBC Replace K+   LOS: 25 days    Darneshia Demary A 10/06/2016

## 2016-10-06 NOTE — Progress Notes (Addendum)
CRITICAL VALUE ALERT  Critical value received:  Hgb 6.7  Date of notification:  10/06/16  Time of notification:  0532  Critical value read back:Yes.    Nurse who received alert:  Carolyne Fiscal   MD notified (1st page): Kinsinger   Time of first page:  [phone]   Responding MD: Kinsinger  Time MD responded: [phone]  No orders received via phone about hemoglobin. Received verbal order to renew restraints. Will continue to monitor.

## 2016-10-06 NOTE — Progress Notes (Signed)
Pt complaning of abdominal pain with turning. Foley d/c earlier today. Bladder scanned 713ml. Md paged. Order I/O cath. Drained 900 ml. Pt stated she has relief. Will continue to monitor.

## 2016-10-07 LAB — TYPE AND SCREEN
ABO/RH(D): O POS
Antibody Screen: POSITIVE
DAT, IgG: NEGATIVE
UNIT DIVISION: 0
Unit division: 0

## 2016-10-07 LAB — BASIC METABOLIC PANEL
Anion gap: 5 (ref 5–15)
BUN: 9 mg/dL (ref 6–20)
CHLORIDE: 117 mmol/L — AB (ref 101–111)
CO2: 23 mmol/L (ref 22–32)
CREATININE: 0.95 mg/dL (ref 0.44–1.00)
Calcium: 8 mg/dL — ABNORMAL LOW (ref 8.9–10.3)
GFR calc Af Amer: >60 mL/min (ref >60)
GFR calc non Af Amer: >60 mL/min (ref >60)
Glucose, Bld: 139 mg/dL — ABNORMAL HIGH (ref 65–99)
POTASSIUM: 4.1 mmol/L (ref 3.5–5.1)
SODIUM: 145 mmol/L (ref 135–145)

## 2016-10-07 LAB — CBC
HEMATOCRIT: 26.4 % — AB (ref 36.0–46.0)
HEMOGLOBIN: 8.7 g/dL — AB (ref 12.0–15.0)
MCH: 29.9 pg (ref 26.0–34.0)
MCHC: 33 g/dL (ref 30.0–36.0)
MCV: 90.7 fL (ref 78.0–100.0)
Platelets: 297 10*3/uL (ref 150–400)
RBC: 2.91 MIL/uL — AB (ref 3.87–5.11)
RDW: 20 % — ABNORMAL HIGH (ref 11.5–15.5)
WBC: 12.8 10*3/uL — ABNORMAL HIGH (ref 4.0–10.5)

## 2016-10-07 LAB — GLUCOSE, CAPILLARY
GLUCOSE-CAPILLARY: 104 mg/dL — AB (ref 65–99)
GLUCOSE-CAPILLARY: 110 mg/dL — AB (ref 65–99)
GLUCOSE-CAPILLARY: 112 mg/dL — AB (ref 65–99)
GLUCOSE-CAPILLARY: 120 mg/dL — AB (ref 65–99)
GLUCOSE-CAPILLARY: 94 mg/dL (ref 65–99)
Glucose-Capillary: 131 mg/dL — ABNORMAL HIGH (ref 65–99)

## 2016-10-07 MED ORDER — KCL IN DEXTROSE-NACL 20-5-0.2 MEQ/L-%-% IV SOLN
INTRAVENOUS | Status: DC
  Administered 2016-10-07: 20:00:00 via INTRAVENOUS
  Administered 2016-10-08: 30 mL/h via INTRAVENOUS
  Filled 2016-10-07 (×2): qty 1000
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
## 2016-10-07 NOTE — Progress Notes (Signed)
Arrived from 3S to [id number]. Alert and oriented to to person.  Trach in place with CDI dressing. Midline abdomen dressing CDI. Bed in lowest position. Call light within reach.

## 2016-10-08 LAB — GLUCOSE, CAPILLARY
GLUCOSE-CAPILLARY: 102 mg/dL — AB (ref 65–99)
Glucose-Capillary: 104 mg/dL — ABNORMAL HIGH (ref 65–99)
Glucose-Capillary: 108 mg/dL — ABNORMAL HIGH (ref 65–99)
Glucose-Capillary: 115 mg/dL — ABNORMAL HIGH (ref 65–99)
Glucose-Capillary: 118 mg/dL — ABNORMAL HIGH (ref 65–99)
Glucose-Capillary: 97 mg/dL (ref 65–99)
Glucose-Capillary: 99 mg/dL (ref 65–99)
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
## 2016-10-09 ENCOUNTER — Inpatient Hospital Stay (HOSPITAL_COMMUNITY)
Admission: RE | Admit: 2016-10-09 | Discharge: 2016-12-12 | DRG: 949 | Disposition: A | Discharge status: Skilled Nursing Facility | Payer: Medicaid Other | Source: Intra-hospital | Attending: Physical Medicine & Rehabilitation | Admitting: Physical Medicine & Rehabilitation

## 2016-10-09 ENCOUNTER — Ambulatory Visit: Payer: Self-pay | Admitting: Orthopedic Surgery

## 2016-10-09 ENCOUNTER — Inpatient Hospital Stay (HOSPITAL_COMMUNITY): Payer: Medicaid Other

## 2016-10-09 ENCOUNTER — Encounter (HOSPITAL_COMMUNITY): Payer: Self-pay

## 2016-10-09 DIAGNOSIS — R451 Restlessness and agitation: Secondary | ICD-10-CM | POA: Diagnosis not present

## 2016-10-09 DIAGNOSIS — Z811 Family history of alcohol abuse and dependence: Secondary | ICD-10-CM | POA: Diagnosis not present

## 2016-10-09 DIAGNOSIS — Z2989 Encounter for other specified prophylactic measures: Secondary | ICD-10-CM

## 2016-10-09 DIAGNOSIS — K5900 Constipation, unspecified: Secondary | ICD-10-CM | POA: Diagnosis present

## 2016-10-09 DIAGNOSIS — F17210 Nicotine dependence, cigarettes, uncomplicated: Secondary | ICD-10-CM | POA: Diagnosis present

## 2016-10-09 DIAGNOSIS — K5901 Slow transit constipation: Secondary | ICD-10-CM

## 2016-10-09 DIAGNOSIS — Z9049 Acquired absence of other specified parts of digestive tract: Secondary | ICD-10-CM | POA: Diagnosis not present

## 2016-10-09 DIAGNOSIS — Z886 Allergy status to analgesic agent status: Secondary | ICD-10-CM

## 2016-10-09 DIAGNOSIS — K639 Disease of intestine, unspecified: Secondary | ICD-10-CM | POA: Diagnosis not present

## 2016-10-09 DIAGNOSIS — T814XXS Infection following a procedure, sequela: Secondary | ICD-10-CM | POA: Diagnosis not present

## 2016-10-09 DIAGNOSIS — Z8674 Personal history of sudden cardiac arrest: Secondary | ICD-10-CM | POA: Diagnosis not present

## 2016-10-09 DIAGNOSIS — S0219XD Other fracture of base of skull, subsequent encounter for fracture with routine healing: Secondary | ICD-10-CM

## 2016-10-09 DIAGNOSIS — F068 Other specified mental disorders due to known physiological condition: Secondary | ICD-10-CM | POA: Diagnosis not present

## 2016-10-09 DIAGNOSIS — D72829 Elevated white blood cell count, unspecified: Secondary | ICD-10-CM | POA: Diagnosis not present

## 2016-10-09 DIAGNOSIS — F1910 Other psychoactive substance abuse, uncomplicated: Secondary | ICD-10-CM | POA: Diagnosis not present

## 2016-10-09 DIAGNOSIS — Z8711 Personal history of peptic ulcer disease: Secondary | ICD-10-CM | POA: Diagnosis not present

## 2016-10-09 DIAGNOSIS — S2249XD Multiple fractures of ribs, unspecified side, subsequent encounter for fracture with routine healing: Secondary | ICD-10-CM | POA: Diagnosis not present

## 2016-10-09 DIAGNOSIS — R4189 Other symptoms and signs involving cognitive functions and awareness: Secondary | ICD-10-CM

## 2016-10-09 DIAGNOSIS — S069X0S Unspecified intracranial injury without loss of consciousness, sequela: Secondary | ICD-10-CM | POA: Diagnosis not present

## 2016-10-09 DIAGNOSIS — I952 Hypotension due to drugs: Secondary | ICD-10-CM

## 2016-10-09 DIAGNOSIS — Z298 Encounter for other specified prophylactic measures: Secondary | ICD-10-CM | POA: Diagnosis not present

## 2016-10-09 DIAGNOSIS — T8189XA Other complications of procedures, not elsewhere classified, initial encounter: Secondary | ICD-10-CM

## 2016-10-09 DIAGNOSIS — S06369D Traumatic hemorrhage of cerebrum, unspecified, with loss of consciousness of unspecified duration, subsequent encounter: Principal | ICD-10-CM

## 2016-10-09 DIAGNOSIS — F332 Major depressive disorder, recurrent severe without psychotic features: Secondary | ICD-10-CM | POA: Diagnosis present

## 2016-10-09 DIAGNOSIS — Z930 Tracheostomy status: Secondary | ICD-10-CM

## 2016-10-09 DIAGNOSIS — G479 Sleep disorder, unspecified: Secondary | ICD-10-CM

## 2016-10-09 DIAGNOSIS — R1310 Dysphagia, unspecified: Secondary | ICD-10-CM | POA: Diagnosis present

## 2016-10-09 DIAGNOSIS — E039 Hypothyroidism, unspecified: Secondary | ICD-10-CM | POA: Diagnosis present

## 2016-10-09 DIAGNOSIS — S14109D Unspecified injury at unspecified level of cervical spinal cord, subsequent encounter: Secondary | ICD-10-CM

## 2016-10-09 DIAGNOSIS — Z79899 Other long term (current) drug therapy: Secondary | ICD-10-CM | POA: Diagnosis not present

## 2016-10-09 DIAGNOSIS — D62 Acute posthemorrhagic anemia: Secondary | ICD-10-CM | POA: Diagnosis present

## 2016-10-09 DIAGNOSIS — I10 Essential (primary) hypertension: Secondary | ICD-10-CM | POA: Diagnosis present

## 2016-10-09 DIAGNOSIS — Z9884 Bariatric surgery status: Secondary | ICD-10-CM | POA: Diagnosis not present

## 2016-10-09 DIAGNOSIS — T814XXD Infection following a procedure, subsequent encounter: Secondary | ICD-10-CM | POA: Diagnosis not present

## 2016-10-09 DIAGNOSIS — S062X4S Diffuse traumatic brain injury with loss of consciousness of 6 hours to 24 hours, sequela: Secondary | ICD-10-CM | POA: Diagnosis not present

## 2016-10-09 LAB — BASIC METABOLIC PANEL
Anion gap: 5 (ref 5–15)
BUN: 9 mg/dL (ref 6–20)
CO2: 26 mmol/L (ref 22–32)
CREATININE: 0.82 mg/dL (ref 0.44–1.00)
Calcium: 7.9 mg/dL — ABNORMAL LOW (ref 8.9–10.3)
Chloride: 110 mmol/L (ref 101–111)
GFR calc Af Amer: >60 mL/min (ref >60)
GFR calc non Af Amer: >60 mL/min (ref >60)
GLUCOSE: 98 mg/dL (ref 65–99)
Potassium: 3.1 mmol/L — ABNORMAL LOW (ref 3.5–5.1)
SODIUM: 141 mmol/L (ref 135–145)

## 2016-10-09 LAB — CBC
HCT: 27 % — ABNORMAL LOW (ref 36.0–46.0)
HEMOGLOBIN: 8.6 g/dL — AB (ref 12.0–15.0)
MCH: 29.9 pg (ref 26.0–34.0)
MCHC: 31.9 g/dL (ref 30.0–36.0)
MCV: 93.8 fL (ref 78.0–100.0)
PLATELETS: 238 10*3/uL (ref 150–400)
RBC: 2.88 MIL/uL — ABNORMAL LOW (ref 3.87–5.11)
RDW: 18.8 % — ABNORMAL HIGH (ref 11.5–15.5)
WBC: 10.6 10*3/uL — ABNORMAL HIGH (ref 4.0–10.5)

## 2016-10-09 LAB — GLUCOSE, CAPILLARY
Glucose-Capillary: 94 mg/dL (ref 65–99)
Glucose-Capillary: 98 mg/dL (ref 65–99)
Glucose-Capillary: 113 mg/dL — ABNORMAL HIGH (ref 65–99)

## 2016-10-09 LAB — MAGNESIUM: MAGNESIUM: 1.9 mg/dL (ref 1.7–2.4)

## 2016-10-09 MED ORDER — CLONAZEPAM 0.5 MG PO TABS
0.5000 mg | ORAL_TABLET | Freq: Two times a day (BID) | ORAL | Status: DC
  Administered 2016-10-09 – 2016-10-10 (×2): 0.5 mg via ORAL
  Filled 2016-10-09 (×2): qty 1

## 2016-10-09 MED ORDER — ENSURE ENLIVE PO LIQD
237.0000 mL | Freq: Three times a day (TID) | ORAL | Status: DC
  Filled 2016-10-09 (×5): qty 237

## 2016-10-09 MED ORDER — ADULT MULTIVITAMIN W/MINERALS CH
1.0000 | ORAL_TABLET | Freq: Every day | ORAL | Status: DC
  Administered 2016-10-10 – 2016-10-24 (×14): 1 via ORAL
  Filled 2016-10-09 (×16): qty 1

## 2016-10-09 MED ORDER — ONDANSETRON HCL 4 MG/2ML IJ SOLN: 4.0000 mg | Freq: Four times a day (QID) | INTRAMUSCULAR | Status: DC | PRN

## 2016-10-09 MED ORDER — POTASSIUM CHLORIDE 20 MEQ PO PACK
40.0000 meq | PACK | Freq: Two times a day (BID) | ORAL | Status: DC
Start: 2016-10-09 — End: 2016-10-09
  Administered 2016-10-09: 40 meq via ORAL
  Filled 2016-10-09 (×2): qty 2

## 2016-10-09 MED ORDER — LEVETIRACETAM 100 MG/ML PO SOLN
500.0000 mg | Freq: Two times a day (BID) | ORAL | Status: DC
  Administered 2016-10-09 – 2016-10-24 (×30): 500 mg via ORAL
  Filled 2016-10-09 (×30): qty 5

## 2016-10-09 MED ORDER — DOCUSATE SODIUM 50 MG/5ML PO LIQD: 100.0000 mg | Freq: Two times a day (BID) | ORAL | Status: DC | PRN

## 2016-10-09 MED ORDER — ALBUTEROL SULFATE (2.5 MG/3ML) 0.083% IN NEBU: 2.5000 mg | INHALATION_SOLUTION | RESPIRATORY_TRACT | Status: DC | PRN

## 2016-10-09 MED ORDER — CLONAZEPAM 0.5 MG PO TABS: 0.5000 mg | ORAL_TABLET | Freq: Two times a day (BID) | ORAL | Status: DC

## 2016-10-09 MED ORDER — HYDROMORPHONE HCL 1 MG/ML IJ SOLN: 0.5000 mg | INTRAMUSCULAR | Status: DC | PRN

## 2016-10-09 MED ORDER — MAGNESIUM OXIDE 400 (241.3 MG) MG PO TABS
800.0000 mg | ORAL_TABLET | Freq: Once | ORAL | Status: AC
  Administered 2016-10-09: 800 mg via ORAL
  Filled 2016-10-09: qty 2

## 2016-10-09 MED ORDER — BISACODYL 10 MG RE SUPP: 10.0000 mg | Freq: Every day | RECTAL | Status: DC | PRN

## 2016-10-09 MED ORDER — HYDROCODONE-ACETAMINOPHEN 7.5-325 MG/15ML PO SOLN
10.0000 mL | ORAL | Status: DC | PRN
  Administered 2016-10-10 – 2016-10-11 (×3): 15 mL via ORAL
  Administered 2016-10-11: 20 mL via ORAL
  Administered 2016-10-11 – 2016-10-12 (×3): 15 mL via ORAL
  Administered 2016-10-12: 20 mL via ORAL
  Administered 2016-10-12: 15 mL via ORAL
  Administered 2016-10-13 – 2016-10-14 (×5): 20 mL via ORAL
  Administered 2016-10-14: 15 mL via ORAL
  Administered 2016-10-14 (×2): 20 mL via ORAL
  Administered 2016-10-15 (×2): 15 mL via ORAL
  Administered 2016-10-15: 20 mL via ORAL
  Administered 2016-10-15 – 2016-10-17 (×8): 15 mL via ORAL
  Administered 2016-10-17: 20 mL via ORAL
  Administered 2016-10-17: 15 mL via ORAL
  Administered 2016-10-18 (×2): 20 mL via ORAL
  Administered 2016-10-19: 15 mL via ORAL
  Administered 2016-10-21: 20 mL via ORAL
  Filled 2016-10-09: qty 30
  Filled 2016-10-09: qty 15
  Filled 2016-10-09 (×2): qty 30
  Filled 2016-10-09 (×2): qty 15
  Filled 2016-10-09: qty 30
  Filled 2016-10-09: qty 15
  Filled 2016-10-09: qty 30
  Filled 2016-10-09: qty 15
  Filled 2016-10-09 (×2): qty 30
  Filled 2016-10-09: qty 15
  Filled 2016-10-09: qty 30
  Filled 2016-10-09 (×3): qty 15
  Filled 2016-10-09 (×2): qty 30
  Filled 2016-10-09 (×3): qty 15
  Filled 2016-10-09: qty 30
  Filled 2016-10-09 (×2): qty 15
  Filled 2016-10-09: qty 30
  Filled 2016-10-09 (×3): qty 15
  Filled 2016-10-09: qty 30
  Filled 2016-10-09: qty 15
  Filled 2016-10-09 (×3): qty 30
  Filled 2016-10-09: qty 15

## 2016-10-09 MED ORDER — ONDANSETRON HCL 4 MG PO TABS
4.0000 mg | ORAL_TABLET | Freq: Four times a day (QID) | ORAL | Status: DC | PRN
  Administered 2016-10-15 – 2016-11-18 (×6): 4 mg via ORAL
  Filled 2016-10-09 (×6): qty 1

## 2016-10-09 MED ORDER — BOOST / RESOURCE BREEZE PO LIQD
1.0000 | Freq: Three times a day (TID) | ORAL | Status: DC
  Filled 2016-10-09 (×4): qty 1

## 2016-10-09 MED ORDER — SORBITOL 70 % SOLN
30.0000 mL | Freq: Every day | Status: DC | PRN
  Administered 2016-11-23 – 2016-12-10 (×3): 30 mL via ORAL
  Filled 2016-10-09 (×4): qty 30

## 2016-10-09 MED ORDER — PANTOPRAZOLE SODIUM 40 MG PO PACK
40.0000 mg | PACK | Freq: Every day | ORAL | Status: DC
  Administered 2016-10-10 – 2016-11-01 (×21): 40 mg via ORAL
  Filled 2016-10-09 (×15): qty 20

## 2016-10-09 MED ORDER — VITAMIN B-1 100 MG PO TABS
100.0000 mg | ORAL_TABLET | Freq: Every day | ORAL | Status: DC
  Administered 2016-10-10 – 2016-12-12 (×63): 100 mg via ORAL
  Filled 2016-10-09 (×64): qty 1

## 2016-10-09 MED ORDER — QUETIAPINE FUMARATE 100 MG PO TABS
100.0000 mg | ORAL_TABLET | Freq: Three times a day (TID) | ORAL | Status: DC
  Administered 2016-10-09 – 2016-10-11 (×7): 100 mg via ORAL
  Filled 2016-10-09 (×7): qty 1

## 2016-10-09 MED ORDER — ENSURE ENLIVE PO LIQD
237.0000 mL | Freq: Three times a day (TID) | ORAL | Status: DC
  Administered 2016-10-11 – 2016-12-09 (×123): 237 mL via ORAL

## 2016-10-09 MED ORDER — BUPROPION HCL ER (XL) 150 MG PO TB24
300.0000 mg | ORAL_TABLET | Freq: Every day | ORAL | Status: DC
  Administered 2016-10-10: 300 mg via ORAL
  Filled 2016-10-09: qty 2

## 2016-10-09 MED ORDER — RESOURCE THICKENUP CLEAR PO POWD
ORAL | Status: DC | PRN
  Filled 2016-10-09 (×3): qty 125
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
## 2016-10-09 NOTE — Progress Notes (Signed)
Rehab admissions - I met with patient (very confused), her mom, and a daughter at the bedside.  Mom is feeling very overwhelmed and I do not think mom can take patient into her home.  Patient will likely need a letter of guarantee for SNF placement after inpatient rehab stay.  I do feel patient needs TBI rehab here at [facility].  Bed available and will admit to acute inpatient rehab today.  Call me for questions.  #[phone]
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
## 2016-10-09 NOTE — Care Management Note (Signed)
Case Management Note  Patient Details  Name: Jacqueline [name] MRN: [id number] Date of Birth: 1966-07-11  Subjective/Objective:  Pt medically stable for discharge today.                  Action/Plan: Plan dc to [facility] later today.  Expected Discharge Date:   10/09/2016               Expected Discharge Plan:  IP Rehab Facility  In-House Referral:  Clinical Social Work  Discharge planning Services  CM Consult  Post Acute Care Choice:    Choice offered to:     DME Arranged:    DME Agency:     HH Arranged:    [place] Agency:     Status of Service:  Completed, signed off  If discussed at [employer], dates discussed:    Additional Comments:  Ella Bodo, RN 10/09/2016, 5:04 PM
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
## 2016-10-09 NOTE — Progress Notes (Signed)
Abdominal x-ray obtained for patient complaints of nausea showing suspicions for obstruction. Notified MD. Patient with increased intake of fluids and having multiple soft bowel movements. Will pursue transfer to inpatient rehab. Jacqueline [name]
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
## 2016-10-10 ENCOUNTER — Inpatient Hospital Stay (HOSPITAL_COMMUNITY): Payer: Medicaid Other | Admitting: Occupational Therapy

## 2016-10-10 ENCOUNTER — Inpatient Hospital Stay (HOSPITAL_COMMUNITY): Payer: Medicaid Other | Admitting: Physical Therapy

## 2016-10-10 ENCOUNTER — Inpatient Hospital Stay (HOSPITAL_COMMUNITY): Payer: Medicaid Other

## 2016-10-10 ENCOUNTER — Inpatient Hospital Stay (HOSPITAL_COMMUNITY): Payer: Medicaid Other | Admitting: Speech Pathology

## 2016-10-10 DIAGNOSIS — M7989 Other specified soft tissue disorders: Secondary | ICD-10-CM

## 2016-10-10 LAB — COMPREHENSIVE METABOLIC PANEL
ALT: 41 U/L (ref 14–54)
AST: 27 U/L (ref 15–41)
Albumin: 2.1 g/dL — ABNORMAL LOW (ref 3.5–5.0)
Alkaline Phosphatase: 188 U/L — ABNORMAL HIGH (ref 38–126)
Anion gap: 9 (ref 5–15)
BILIRUBIN TOTAL: 0.3 mg/dL (ref 0.3–1.2)
BUN: 10 mg/dL (ref 6–20)
CALCIUM: 8 mg/dL — AB (ref 8.9–10.3)
CO2: 25 mmol/L (ref 22–32)
CREATININE: 0.84 mg/dL (ref 0.44–1.00)
Chloride: 111 mmol/L (ref 101–111)
GFR calc non Af Amer: >60 mL/min (ref >60)
Glucose, Bld: 100 mg/dL — ABNORMAL HIGH (ref 65–99)
Potassium: 3 mmol/L — ABNORMAL LOW (ref 3.5–5.1)
Sodium: 145 mmol/L (ref 135–145)
Total Protein: 5.7 g/dL — ABNORMAL LOW (ref 6.5–8.1)

## 2016-10-10 LAB — CBC WITH DIFFERENTIAL/PLATELET
BASOS ABS: 0 10*3/uL (ref 0.0–0.1)
Basophils Relative: 0 %
Eosinophils Absolute: 0.4 10*3/uL (ref 0.0–0.7)
Eosinophils Relative: 5 %
HEMATOCRIT: 33.8 % — AB (ref 36.0–46.0)
Hemoglobin: 10.4 g/dL — ABNORMAL LOW (ref 12.0–15.0)
LYMPHS ABS: 1.2 10*3/uL (ref 0.7–4.0)
LYMPHS PCT: 15 %
MCH: 28.4 pg (ref 26.0–34.0)
MCHC: 30.8 g/dL (ref 30.0–36.0)
MCV: 92.3 fL (ref 78.0–100.0)
MONO ABS: 0.6 10*3/uL (ref 0.1–1.0)
Monocytes Relative: 8 %
NEUTROS ABS: 5.8 10*3/uL (ref 1.7–7.7)
Neutrophils Relative %: 72 %
Platelets: 232 10*3/uL (ref 150–400)
RBC: 3.66 MIL/uL — AB (ref 3.87–5.11)
RDW: 18.2 % — ABNORMAL HIGH (ref 11.5–15.5)
WBC: 8 10*3/uL (ref 4.0–10.5)

## 2016-10-10 MED ORDER — VALPROATE SODIUM 250 MG/5ML PO SOLN
250.0000 mg | Freq: Three times a day (TID) | ORAL | Status: DC
  Administered 2016-10-10 – 2016-10-15 (×15): 250 mg via ORAL
  Filled 2016-10-10 (×16): qty 5

## 2016-10-10 MED ORDER — VALPROATE SODIUM 250 MG/5ML PO SOLN
250.0000 mg | Freq: Three times a day (TID) | ORAL | Status: DC
  Filled 2016-10-10 (×2): qty 5

## 2016-10-10 MED ORDER — POTASSIUM CHLORIDE CRYS ER 20 MEQ PO TBCR
20.0000 meq | EXTENDED_RELEASE_TABLET | Freq: Three times a day (TID) | ORAL | Status: DC
  Administered 2016-10-10 – 2016-10-11 (×2): 20 meq via ORAL
  Filled 2016-10-10 (×2): qty 1

## 2016-10-10 MED ORDER — BUPROPION HCL 100 MG PO TABS
100.0000 mg | ORAL_TABLET | Freq: Three times a day (TID) | ORAL | Status: DC
  Administered 2016-10-10 – 2016-10-22 (×36): 100 mg via ORAL
  Filled 2016-10-10 (×38): qty 1

## 2016-10-10 MED ORDER — CLONAZEPAM 0.5 MG PO TABS: 0.5000 mg | ORAL_TABLET | Freq: Three times a day (TID) | ORAL | Status: DC

## 2016-10-10 MED ORDER — CLONAZEPAM 0.5 MG PO TABS
0.5000 mg | ORAL_TABLET | Freq: Three times a day (TID) | ORAL | Status: DC
  Administered 2016-10-10 – 2016-10-11 (×5): 0.5 mg via ORAL
  Filled 2016-10-10 (×5): qty 1

## 2016-10-10 MED ORDER — SODIUM CHLORIDE 0.9% FLUSH
10.0000 mL | INTRAVENOUS | Status: DC | PRN
  Administered 2016-10-10: 30 mL
  Administered 2016-10-11 – 2016-10-12 (×3): 10 mL
  Filled 2016-10-10 (×4): qty 40

## 2016-10-10 MED ORDER — ORAL CARE MOUTH RINSE
15.0000 mL | Freq: Two times a day (BID) | OROMUCOSAL | Status: DC
  Administered 2016-10-10 – 2016-12-06 (×60): 15 mL via OROMUCOSAL

## 2016-10-10 MED ORDER — BUPROPION HCL 100 MG PO TABS
100.0000 mg | ORAL_TABLET | Freq: Three times a day (TID) | ORAL | Status: DC
  Filled 2016-10-10 (×2): qty 1
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
## 2016-10-10 NOTE — Progress Notes (Signed)
*  PRELIMINARY RESULTS* Vascular Ultrasound Lower extremity venous duplex has been completed.  Preliminary findings: no evidence of DVT. Bilateral baker's cysts noted.  Landry Mellow, RDMS, RVT  10/10/2016, 3:12 PM

## 2016-10-10 NOTE — Plan of Care (Signed)
Problem: RH BOWEL ELIMINATION Goal: RH STG MANAGE BOWEL W/EQUIPMENT W/ASSISTANCE STG Manage Bowel With Equipment With mod Assistance  Outcome: Not Progressing Max assist

## 2016-10-10 NOTE — Plan of Care (Signed)
Problem: RH COGNITION-NURSING Goal: RH STG USES MEMORY AIDS/STRATEGIES W/ASSIST TO PROBLEM SOLVE STG Uses Memory Aids/Strategies With  Mod Assistance to Problem Solve.  Outcome: Not Progressing Total assist

## 2016-10-10 NOTE — Progress Notes (Addendum)
Per report patient arrive on floor at 6:20. Transfer from 5C10 7:30 patient restless in bed O2 sats 98% pulse WNL

## 2016-10-10 NOTE — Plan of Care (Signed)
Problem: RH SAFETY Goal: RH STG ADHERE TO SAFETY PRECAUTIONS W/ASSISTANCE/DEVICE STG Adhere to Safety Precautions With mod  Assistance/Device.  Outcome: Not Progressing Max to total assist  Goal: RH STG DECREASED RISK OF FALL WITH ASSISTANCE STG Decreased Risk of Fall With mod Assistance.  Outcome: Not Progressing Total assist   Problem: RH COGNITION-NURSING Goal: RH STG USES MEMORY AIDS/STRATEGIES W/ASSIST TO PROBLEM SOLVE STG Uses Memory Aids/Strategies With  Mod Assistance to Problem Solve.  Outcome: Not Progressing Max assist  Goal: RH STG ANTICIPATES NEEDS/CALLS FOR ASSIST W/ASSIST/CUES STG Anticipates Needs/Calls for Assist With min Assistance/Cues.  Outcome: Not Progressing Total assist   Problem: RH KNOWLEDGE DEFICIT BRAIN INJURY Goal: RH STG INCREASE KNOWLEDGE OF DYSPHAGIA/FLUID INTAKE Family able to verbalize dysphagia diet  With min assistance  Outcome: Not Applicable Date Met: 16/55/37 No family present
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
## 2016-10-10 NOTE — Consult Note (Addendum)
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 Wound has continued to decrease slightly in size and depth since previous assessment. Wound type: Pressure Pressure Ulcer POA: No Measurement: .8cm x 1.5cm x 0.2cm Wound bed: red and moist Drainage (amount, consistency, odor) moderate amount of tan drainage leaking around trach insertion site; it is difficult to keep wound from becoming soiled. Dressing procedure/placement/frequency: Continue present plan of care with Aquacel to absorb drainage and provide antimicrobial benefits, foam dressing to decrease pressure to the affected area. Please re-consult if further assistance is needed. Thank-you,  Julien Girt MSN, [place], [place], [place], [place]

## 2016-10-10 NOTE — Progress Notes (Signed)
Patient information reviewed and entered into eRehab system by Najla Aughenbaugh, RN, CRRN, PPS Coordinator.  Information including medical coding and functional independence measure will be reviewed and updated through discharge.    
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
## 2016-10-10 NOTE — Progress Notes (Signed)
Occupational Therapy Note  Patient Details  Name: Jacqueline [name] MRN: [id number] Date of Birth: 1966-06-25  Today's Date: 10/10/2016 OT Missed Time: 38 Minutes Missed Time Reason: Patient fatigue   Upon entering the room, pt supine in bed and having difficulty opening eyes with therapist in the room. Pt requesting to be left alone to rest. RN reporting that pt is extremely fatigued from prior therapies and just returned to bed. Pt missed 30 minutes of OT intervention secondary to fatigue.     Darleen Crocker P 10/10/2016, 3:52 PM

## 2016-10-11 ENCOUNTER — Inpatient Hospital Stay (HOSPITAL_COMMUNITY): Payer: Self-pay | Admitting: Occupational Therapy

## 2016-10-11 ENCOUNTER — Inpatient Hospital Stay (HOSPITAL_COMMUNITY): Payer: Medicaid Other | Admitting: Speech Pathology

## 2016-10-11 ENCOUNTER — Inpatient Hospital Stay (HOSPITAL_COMMUNITY): Payer: Medicaid Other | Admitting: Physical Therapy

## 2016-10-11 ENCOUNTER — Inpatient Hospital Stay (HOSPITAL_COMMUNITY): Payer: Self-pay | Admitting: Physical Therapy

## 2016-10-11 DIAGNOSIS — R1310 Dysphagia, unspecified: Secondary | ICD-10-CM

## 2016-10-11 LAB — URINALYSIS, ROUTINE W REFLEX MICROSCOPIC
Bilirubin Urine: NEGATIVE
GLUCOSE, UA: NEGATIVE mg/dL
HGB URINE DIPSTICK: NEGATIVE
KETONES UR: NEGATIVE mg/dL
NITRITE: POSITIVE — AB
PROTEIN: 30 mg/dL — AB
Specific Gravity, Urine: 1.017 (ref 1.005–1.030)
pH: 6 (ref 5.0–8.0)

## 2016-10-11 MED ORDER — SIMETHICONE 40 MG/0.6ML PO SUSP
40.0000 mg | Freq: Four times a day (QID) | ORAL | Status: DC
  Administered 2016-10-11 – 2016-10-24 (×49): 40 mg via ORAL
  Filled 2016-10-11 (×52): qty 0.6

## 2016-10-11 MED ORDER — POTASSIUM CHLORIDE ER 10 MEQ PO TBCR
20.0000 meq | EXTENDED_RELEASE_TABLET | Freq: Two times a day (BID) | ORAL | Status: DC
  Administered 2016-10-11: 20 meq via ORAL
  Filled 2016-10-11: qty 1
  Filled 2016-10-11: qty 2

## 2016-10-11 NOTE — Progress Notes (Signed)
Physical Therapy Session Note  Patient Details  Name: Jacqueline [name] MRN: [id number] Date of Birth: [birth year]  Today's Date: 10/11/2016 PT Individual Time: 0900-0950 PT Individual Time Calculation (min): 50 min    Short Term Goals: Week 1:  PT Short Term Goal 1 (Week 1): Patient will perform transfers with min A.  PT Short Term Goal 2 (Week 1): Patient will ambulate 150 ft with min A.  PT Short Term Goal 3 (Week 1): Patient will negotiate up/down 12 stairs using 2 rails with min A.  PT Short Term Goal 4 (Week 1): Patient will focus attention to functional task x 60 sec with max multimodal cues.  PT Short Term Goal 5 (Week 1): Patient will be oriented to place and situation with max multimodal cues.   Skilled Therapeutic Interventions/Progress Updates:   Treatment today focused on improving pt tolerance to sitting position, transfers, and bed mobility.  Pt performed sitting balance task in gym transferring cones, however, had difficulty with following commands and distractibility.  Additionally worked on blocked practice of sit to stand which pt was able to complete for 2 x 5.  Pt requires cues to focus and demonstrates perseveration on UE restraint and stomach incision bandage.  Pt additionally somewhat limited by endurance during session and when therapist attempted to continue to address bed mobility and transfers at end of session, pt continued to refuse while closing her eyes.  Pt was left in room, restrained as per safety plan with nursing in room to address oxygen levels.  Hard collar in place during session at all times, however, pt does attempt to pull at collar intermittently.  Therapy Documentation Precautions:  Precautions Precautions: Cervical, Fall, Other (comment) Precaution Comments: abdominal incision Required Braces or Orthoses: Cervical Brace Cervical Brace: Hard collar, At all times Restrictions Weight Bearing Restrictions: No    Vital Signs:  Initial Sp02: 97% with  PMSV, however, during session after prolonged sitting balance activities on mat table, pt reports having difficulty breathing and Sp02:  84%;  PMSV removed and Sp02 returned to 100%;  Respiratory capped trach during PT session and after therapist/nursing completing restraints, Sp02:  86% - nursing notified and pt left in care of nursing to resolve issue.   Pain:  Reports 6/10 pain in abdomen - per OT pt was premedicated prior to session.    See Function Navigator for Current Functional Status.   Therapy/Group: Individual Therapy  Sanjuan Sawa Hilario Quarry 10/11/2016, 10:17 AM

## 2016-10-11 NOTE — Progress Notes (Signed)
Occupational Therapy Session Note  Patient Details  Name: Jacqueline [name] MRN: [id number] Date of Birth: [birth year]  Today's Date: 10/11/2016 OT Individual Time: 0800-0900 OT Individual Time Calculation (min): 60 min     Short Term Goals: Week 1:  OT Short Term Goal 1 (Week 1): Pt will demonstrate improved praxis by using deoderant in the correct manner. OT Short Term Goal 2 (Week 1): Pt will demonstrate improved sitting balance EOB with S while engaging in UB self care. OT Short Term Goal 3 (Week 1): Pt will demonstrate improved standing balance of min A while engaging in LB self care. OT Short Term Goal 4 (Week 1): Pt will attend to self care task for at least 3 minutes without redirection.  OT Short Term Goal 5 (Week 1): Pt will demonstrate improved orientation to identify that she is in a hospital.  Skilled Therapeutic Interventions/Progress Updates:   1:1 Pt in bed in 4 point restraints when arrived complaning about abdominal pain. Adjusted C- collar pads and for better fit. Pt came to EOB with mod A. Transferred to w/c with min A with extra time. Pt verbalized toileting needs and perform stand pivot with grab bar with min A to the toilet. Pt continued to complain and request food and drink.  Pt required max A to attend to current task. Pt required total A for toileting to ensure thoroughness after BM. Pt don each article of clothing as they were presented with steadying A. Pt allowed therapist to perform oral care with suction toothbrush. Pt did not attempt to drink an inappropriate substance in session. Transitioned to the day room to participate in table top cognitive task requiring her sequence, attend and organize. Pt able to complete the task in a minimal distracting environment with min to mod A with motivator of completing portions of task at a time for a drink. Hand off pt to next therapist.   Therapy Documentation Precautions:  Precautions Precautions: Cervical, Fall, Other  (comment) Precaution Comments: abdominal incision Required Braces or Orthoses: Cervical Brace Cervical Brace: Hard collar, At all times Restrictions Weight Bearing Restrictions: No Pain:  no c/o pain  ADL: ADL ADL Comments: refer to functional navigator  See Function Navigator for Current Functional Status.   Therapy/Group: Individual Therapy  Willeen Cass [facility] 10/11/2016, 10:16 AM

## 2016-10-11 NOTE — Progress Notes (Signed)
Physical Therapy Session Note  Patient Details  Name: Jacqueline [name] MRN: [id number] Date of Birth: [birth year]  Today's Date: 10/11/2016 PT Individual Time: 1030-1100 and 1300-1339 PT Individual Time Calculation (min): 30 min and 39 min   Short Term Goals: Week 1:  PT Short Term Goal 1 (Week 1): Patient will perform transfers with min A.  PT Short Term Goal 2 (Week 1): Patient will ambulate 150 ft with min A.  PT Short Term Goal 3 (Week 1): Patient will negotiate up/down 12 stairs using 2 rails with min A.  PT Short Term Goal 4 (Week 1): Patient will focus attention to functional task x 60 sec with max multimodal cues.  PT Short Term Goal 5 (Week 1): Patient will be oriented to place and situation with max multimodal cues.   Skilled Therapeutic Interventions/Progress Updates:    Treatment 1: Patient awake and restless in bed upon arrival with  4 point restraints, sat EOB with supervision with HOB slightly raised and use of rail with encouragement and increased time. Patient oriented to self only, passively reoriented to place, situation, and date throughout session. Patient ambulated throughout rehab unit from room to therapy gym with +2A HHA for safety due to ataxic and scissoring gait pattern. Seated EOM, patient constantly requesting food/drink and provided honey thick clear liquids with max multimodal cues to adhere to swallow precautions. Prolonged seated rest break due to increased HR. Patient unable to be directed to attempt stairs and consistently demonstrating severely impaired frustration tolerance with all simple commands. Patient continues to demonstrate language of confusion. Patient requesting to "go home," returned to room with seated rest break with +2A HHA and left semi reclined in bed with 4 restraints and mittens donned, 4 rails up, and bed alarm on.   Treatment 2: Patient in wheelchair upon arrival with RN, daughter, mother, and brother present. OT clinical specialist present  to assist with encouraging patient with functional tasks. Family educated on decreasing distractions during session. Patient transported to gym in wheelchair. Patient c/o abdominal pain and fatigue, constantly requesting to "go home." Patient repeatedly reoriented to task of ambulating up/down stairs with reward system initiated of receiving drink and returning to bed. With greatly increased time and coaxing with reward of drink at top of stairs, patient eventually negotiated up/down 3 (6") stairs using 2 rails with min A. With greatly increased time and hand over hand assist to place BUE on wheelchair rims, patient propelled wheelchair in straight path using BUE with supervision and 1 rest break x 100 ft with reward of 8 oz honey thick clear liquid. Patient continued to complain of abdominal pain and request food but declined need for toileting. Transferred to bed and 4 point restraints donned with 4 rails up and bed alarm on. Patient's daughter and mother educated on patient's current status, focus of goals, and anticipated TBI general recovery, both appreciative of education and verbalized understanding.   Therapy Documentation Precautions:  Precautions Precautions: Cervical, Fall, Other (comment) Precaution Comments: abdominal incision Required Braces or Orthoses: Cervical Brace Cervical Brace: Hard collar, At all times Restrictions Weight Bearing Restrictions: No General: PT Amount of Missed Time (min): 21 Minutes PT Missed Treatment Reason: Patient fatigue Pain: Pain Assessment Pain Assessment: Faces Faces Pain Scale: Hurts whole lot Pain Type: Acute pain Pain Location: Abdomen Pain Orientation: Mid Pain Descriptors / Indicators: Sore;Grimacing Pain Onset: With Activity Pain Intervention(s): Rest   See Function Navigator for Current Functional Status.   Therapy/Group: Individual Therapy  Destry Dauber, Wells Guiles  A 10/11/2016, 12:41 PM
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
## 2016-10-11 NOTE — Progress Notes (Signed)
Pt with increased restlessness, mild agitation; calling out with c/o abd. pain/discomfort. Abd. distended, passing gas frequently, loose stools today.  Jacqueline [name] [facility] aware, orders received, monitor.  Also with c/os pain at bladder area, scanned for 17cc after large void.  U/a sent per orders 1/3.

## 2016-10-11 NOTE — Progress Notes (Signed)
RN called RT re: pt's trach being out possibly. RT unable to re-insert trach. RN called MD, per MD ok to remove trach and cover with gauze/tape. RN at bedside. Pt stable throughout with no complications. VS within normal limits. RT will continue to monitor.

## 2016-10-11 NOTE — Progress Notes (Signed)
Pt trach capped per MD order. Pt VS within normal limits. Spo2 99% on RA. Pt with weak but audible non productive cough and able to speak clearly post capping. Per Therapy, Pt spo2 decreased to 84% with PM on during therapy. Pt inner cannula at head of bed if pt spo2 decreases again. RN made aware. RT will continue to closely monitor pt.

## 2016-10-11 NOTE — Progress Notes (Signed)
Speech Language Pathology Daily Session Notes  Patient Details  Name: Jacqueline [name] MRN: [id number] Date of Birth: [birth year]  Today's Date: 10/11/2016  Session 1: SLP Individual Time: 1130-1200 SLP Individual Time Calculation (min): 30 min   Session 2: SLP Individual Time: [id number] SLP Individual Time Calculation (min): 33 min    Short Term Goals: Week 1: SLP Short Term Goal 1 (Week 1): Patient will consume trials of upgraded liquids with minimal overt s/s of aspiration with Mod A verbal cues for use of slow rate over 2 sessions prior to upgrade.  SLP Short Term Goal 2 (Week 1): Patient will demonstrate focused attention to a task for 60 seconds with Max A multimodal cues.  SLP Short Term Goal 3 (Week 1): Patient will self-monitor and correct putting inapprorpriate items into her mouth with Max A multimodal cues.  SLP Short Term Goal 4 (Week 1): Patient will wear PMSV with full supervision with all vitals remaining WFL.  SLP Short Term Goal 5 (Week 1): Patient will follow 1 step commands with 75% accuracy and Max A multimodal cues.   Skilled Therapeutic Interventions:  Session 1: Skilled treatment session focused on dysphagia and cognitive goals. SLP facilitated session by providing a snack of honey-thick liquids. Patient consumed liquids without overt s/s of aspiration but required Max A verbal cues for portion control. Patient with increased restlessness and mild verbal agitation today. Patient requesting to get back into bed constantly throughout session and was difficult to redirect. Towards end of session, patient's trach appeared to be hanging out at least halfway from stoma. RN made aware and called RT. Patient left supine in bed with RN and RT present. Continue with current plan of care.   Session 2: Skilled treatment session focused on dysphagia and cognitive goals. Patient consumed minimal trails of Dys. 1 textures without overt s/s of aspiration and what appeared to be a  delayed swallow initiation. Patient declined further trials due to stomach pain. Recommend patient initiate a diet of Dys. 1 textures with honey-thick liquids (PA agreeable from a medical stand point). Patient consistently asking to go home throughout session with language of confusion. Patient independently requested to use the bathroom but then required encouragement to initiate getting out of bed to walk to commode. Patient continent of a large bowel movement but required Max A verbal cues for problem solving with self-care tasks. Patient transferred back to bed and left supine with bilateral wrist and ankle restraints and bilateral mitts on.   Function:  Eating Eating   Modified Consistency Diet: Yes Eating Assist Level: Helper feeds patient;Set up assist for;More than reasonable amount of time;Supervision or verbal cues;Helper scoops food on utensil   Eating Set Up Assist For: Opening containers       Cognition Comprehension Comprehension assist level: Understands basic 25 - 49% of the time/ requires cueing 50 - 75% of the time  Expression Expression assistive device: Talk trach valve Expression assist level: Expresses basic 25 - 49% of the time/requires cueing 50 - 75% of the time. Uses single words/gestures.  Social Interaction Social Interaction assist level: Interacts appropriately less than 25% of the time. May be withdrawn or combative.  Problem Solving Problem solving assist level: Solves basic less than 25% of the time - needs direction nearly all the time or does not effectively solve problems and may need a restraint for safety  Memory Memory assist level: Recognizes or recalls less than 25% of the time/requires cueing greater than 75% of the time  Pain Pain Assessment Pain Assessment: Faces Faces Pain Scale: Hurts whole lot Pain Type: Acute pain Pain Location: Abdomen Pain Orientation: Mid Pain Descriptors / Indicators: Sore;Grimacing Pain Onset: With Activity Pain  Intervention(s): Rest  Therapy/Group: Individual Therapy  Jacqueline [name] 10/11/2016, 3:13 PM

## 2016-10-12 ENCOUNTER — Inpatient Hospital Stay (HOSPITAL_COMMUNITY): Payer: Self-pay | Admitting: Occupational Therapy

## 2016-10-12 ENCOUNTER — Inpatient Hospital Stay (HOSPITAL_COMMUNITY): Payer: Medicaid Other | Admitting: Speech Pathology

## 2016-10-12 ENCOUNTER — Inpatient Hospital Stay (HOSPITAL_COMMUNITY): Payer: Self-pay

## 2016-10-12 ENCOUNTER — Inpatient Hospital Stay (HOSPITAL_COMMUNITY): Payer: Self-pay | Admitting: Physical Therapy

## 2016-10-12 DIAGNOSIS — S069X0S Unspecified intracranial injury without loss of consciousness, sequela: Secondary | ICD-10-CM

## 2016-10-12 DIAGNOSIS — S069XAS Unspecified intracranial injury with loss of consciousness status unknown, sequela: Secondary | ICD-10-CM

## 2016-10-12 DIAGNOSIS — F068 Other specified mental disorders due to known physiological condition: Secondary | ICD-10-CM

## 2016-10-12 LAB — BASIC METABOLIC PANEL
ANION GAP: 8 (ref 5–15)
ANION GAP: 11 (ref 5–15)
BUN: 5 mg/dL — ABNORMAL LOW (ref 6–20)
BUN: 5 mg/dL — AB (ref 6–20)
CO2: 25 mmol/L (ref 22–32)
CO2: 24 mmol/L (ref 22–32)
Calcium: 7.9 mg/dL — ABNORMAL LOW (ref 8.9–10.3)
Calcium: 8.1 mg/dL — ABNORMAL LOW (ref 8.9–10.3)
Chloride: 109 mmol/L (ref 101–111)
Chloride: 108 mmol/L (ref 101–111)
Creatinine, Ser: 0.86 mg/dL (ref 0.44–1.00)
Creatinine, Ser: 0.85 mg/dL (ref 0.44–1.00)
GFR calc Af Amer: >60 mL/min (ref >60)
GFR calc non Af Amer: >60 mL/min (ref >60)
Glucose, Bld: 113 mg/dL — ABNORMAL HIGH (ref 65–99)
Glucose, Bld: 102 mg/dL — ABNORMAL HIGH (ref 65–99)
POTASSIUM: 2.7 mmol/L — AB (ref 3.5–5.1)
POTASSIUM: 3 mmol/L — AB (ref 3.5–5.1)
SODIUM: 142 mmol/L (ref 135–145)
SODIUM: 143 mmol/L (ref 135–145)

## 2016-10-12 LAB — C DIFFICILE QUICK SCREEN W PCR REFLEX
C DIFFICLE (CDIFF) ANTIGEN: NEGATIVE
C Diff interpretation: NOT DETECTED
C Diff toxin: NEGATIVE

## 2016-10-12 MED ORDER — RISPERIDONE 1 MG/ML PO SOLN
0.5000 mg | Freq: Two times a day (BID) | ORAL | Status: DC
  Administered 2016-10-12 – 2016-10-13 (×2): 0.5 mg via ORAL
  Filled 2016-10-12 (×3): qty 0.5

## 2016-10-12 MED ORDER — CHLORDIAZEPOXIDE HCL 5 MG PO CAPS
10.0000 mg | ORAL_CAPSULE | Freq: Every day | ORAL | Status: DC
  Administered 2016-10-12 – 2016-10-24 (×13): 10 mg via ORAL
  Filled 2016-10-12 (×13): qty 2

## 2016-10-12 MED ORDER — POTASSIUM CHLORIDE 20 MEQ/15ML (10%) PO SOLN
40.0000 meq | Freq: Three times a day (TID) | ORAL | Status: DC
  Administered 2016-10-12 – 2016-10-17 (×15): 40 meq via ORAL
  Filled 2016-10-12 (×15): qty 30

## 2016-10-12 MED ORDER — RISPERIDONE 1 MG/ML PO SOLN
1.0000 mg | Freq: Every day | ORAL | Status: DC
  Administered 2016-10-12 – 2016-10-14 (×3): 1 mg via ORAL
  Filled 2016-10-12 (×3): qty 1

## 2016-10-12 MED ORDER — NICOTINE 21 MG/24HR TD PT24
21.0000 mg | MEDICATED_PATCH | Freq: Every day | TRANSDERMAL | Status: DC
  Administered 2016-10-12 – 2016-12-12 (×62): 21 mg via TRANSDERMAL
  Filled 2016-10-12 (×63): qty 1

## 2016-10-12 MED ORDER — POTASSIUM CHLORIDE 20 MEQ/15ML (10%) PO SOLN: 20.0000 meq | Freq: Two times a day (BID) | ORAL | Status: DC

## 2016-10-12 MED ORDER — CLONAZEPAM 0.5 MG PO TABS
0.5000 mg | ORAL_TABLET | Freq: Two times a day (BID) | ORAL | Status: DC
  Administered 2016-10-12 – 2016-10-15 (×6): 0.5 mg via ORAL
  Filled 2016-10-12 (×7): qty 1

## 2016-10-12 NOTE — IPOC Note (Signed)
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, sequela ([place]) Active Problems:   Polysubstance abuse   Severe episode of recurrent major depressive disorder, without psychotic features ([place])   Mass of colon   Cognitive deficit as late effect of traumatic brain injury [facility])     Functional Problem List: Nursing Behavior, Bladder, Bowel, Endurance, Medication Management, Motor, Nutrition, Pain, Perception, Safety, Sensory, Skin Integrity  PT Balance, Behavior, Endurance, Motor, Nutrition, Pain, Perception, Safety, Skin Integrity  OT Balance, Behavior, Cognition, Endurance, Motor, Perception, Safety  SLP Cognition  TR         Basic ADL's: OT Eating, Grooming, Bathing, Dressing, Toileting     Advanced  ADL's: OT       Transfers: PT Bed Mobility, Bed to Chair, Car, Chief Operating Officer: PT Ambulation, Emergency planning/management officer, Stairs     Additional Impairments: OT Fuctional Use of Upper Extremity  SLP Swallowing, Social Cognition   Social Interaction, Problem Solving, Memory, Attention, Awareness  TR      Anticipated Outcomes Item Anticipated Outcome  Self Feeding supervision  Swallowing  Supervision with least restrictive diet    Basic self-care  min A  Toileting  min A   Bathroom Transfers min A   Bowel/Bladder  Continent of bowel and bladder  Transfers  supervision  Locomotion  supervision household  Communication  Min A   Cognition  Mod A   Pain  Pain <3  Safety/Judgment  patient remains safe during rehab stay   Therapy Plan: PT Intensity: Minimum of 1-2 x/day ,45 to 90 minutes PT Frequency: 5 out of 7 days PT Duration Estimated Length of Stay: 18-21 days OT Intensity: Minimum of 1-2 x/day, 45 to 90 minutes OT Frequency: 5 out of 7 days OT  Duration/Estimated Length of Stay: 18-21 days SLP Intensity: Minumum of 1-2 x/day, 30 to 90 minutes SLP Frequency: 3 to 5 out of 7 days SLP Duration/Estimated Length of Stay: 18-21 days        Team Interventions: Nursing Interventions Patient/Family Education, Pain Management, Dysphagia/Aspiration Precaution Training, Discharge Planning, Medication Management, Bladder Management, Bowel Management, Skin Care/Wound Management, Psychosocial Support, Cognitive Remediation/Compensation, Disease Management/Prevention  PT interventions Ambulation/gait training, Medical illustrator training, Cognitive remediation/compensation, Community reintegration, Discharge planning, Disease management/prevention, DME/adaptive equipment instruction, Functional mobility training, Neuromuscular re-education, Pain management, Patient/family education, Psychosocial support, Stair training, Therapeutic Activities, Therapeutic Exercise, UE/LE Strength taining/ROM, UE/LE Coordination activities, Visual/perceptual remediation/compensation, Wheelchair propulsion/positioning  OT Interventions Training and development officer, Cognitive remediation/compensation, Discharge planning, DME/adaptive equipment instruction, Neuromuscular re-education, Functional mobility training, Patient/family education, Self Care/advanced ADL retraining, UE/LE Strength taining/ROM, Therapeutic Exercise, Therapeutic Activities, UE/LE Coordination activities, Visual/perceptual remediation/compensation  SLP Interventions Cognitive remediation/compensation, Cueing hierarchy, Functional tasks, Patient/family education, Therapeutic Activities, Internal/external aids, Dysphagia/aspiration precaution training, Environmental controls  TR Interventions    SW/CM Interventions Discharge Planning, Psychosocial Support, Patient/Family Education    Team Discharge Planning: Destination: PT-Skilled Nursing Facility (SNF) (per chart family unable to provide 24/7 S) ,OT-  (to  be determined) , SLP-Skilled Nursing Facility (SNF) Projected Follow-up: PT-Skilled nursing facility, 24 hour supervision/assistance (per chart family unable to provide 24/7 S), OT-   (to be determined based on discharge placement), SLP-24 hour supervision/assistance, Skilled Nursing facility Projected Equipment Needs: PT-To be determined, OT- To be determined, SLP-To be determined Equipment Details: PT- , OT-  Patient/family involved in discharge planning: PT- Patient unable/family or caregiver not available,  OT-Patient unable/family or caregiver not available, SLP-Patient unable/family or caregive not available  MD ELOS: 20-24 days Medical Rehab Prognosis:  Good Assessment: The patient has been admitted for CIR therapies with the diagnosis of TBI in the setting of polytrauma and prior psych/substance abuse history. The team will be addressing functional mobility, strength, stamina, balance, safety, adaptive techniques and equipment, self-care, bowel and bladder mgt, patient and caregiver education, behavior mgt, breathing techniques, cognition, swallowing, communication, NMR. Goals have been set at min assist for basic self-care and mobility and mod assist for cognition .    Meredith Staggers, MD, FAAPMR      See Team Conference Notes for weekly updates to the plan of care

## 2016-10-12 NOTE — Progress Notes (Signed)
CRITICAL VALUE ALERT  Critical value received:  Potassium 2.7  Date of notification:  10/12/2016  Time of notification:  04:00  Critical value read back:Yes.    Nurse who received alert: Di Kindle   MD notified (1st page): Marlowe Shores ,PA   Time of first page: 04:15  MD notified (2nd page):  Time of second page:  Responding MD:  Marlowe Shores, PA  Time MD responded:  04:15

## 2016-10-12 NOTE — Progress Notes (Signed)
Physical Therapy Note  Patient Details  Name: Jacqueline [name] MRN: [id number] Date of Birth: 09-09-1966 Today's Date: 10/12/2016    Time: 1330-1400 30 minutes  1:1 Pt c/o pain in abdomen throughout treatment, repositioned and rests given as needed.  Pt performs transfers with min A for safety. gait with min A for ataxia, scissoring (+2 for safety with turns and following directions during gait).  Pt with decreased focused attention < 10 seconds. Pt perseverating on going to bed and having a drink.  Pt able to match cards by color with max cuing for attention with 100% accuracy.  Pt limited by poor attention and awareness, balance improved from eval. Pt left in bed with 4 point restraints and bed alarm on.   Tamarion Haymond 10/12/2016, 2:01 PM
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
## 2016-10-12 NOTE — Progress Notes (Signed)
Occupational Therapy Session Note  Patient Details  Name: Jacqueline [name] MRN: [id number] Date of Birth: 1966-09-11  Today's Date: 10/12/2016 OT Individual Time: [id number] OT Individual Time Calculation (min): 49 min     Short Term Goals: Week 1:  OT Short Term Goal 1 (Week 1): Pt will demonstrate improved praxis by using deoderant in the correct manner. OT Short Term Goal 2 (Week 1): Pt will demonstrate improved sitting balance EOB with S while engaging in UB self care. OT Short Term Goal 3 (Week 1): Pt will demonstrate improved standing balance of min A while engaging in LB self care. OT Short Term Goal 4 (Week 1): Pt will attend to self care task for at least 3 minutes without redirection.  OT Short Term Goal 5 (Week 1): Pt will demonstrate improved orientation to identify that she is in a hospital.  Skilled Therapeutic Interventions/Progress Updates:   1:1 OT session focused on attention to task, activity tolerance, task initiation, and visual scanning/locating. Pt greeted in sidelying with L UE out of 4 point restraints. Pt perseverative on where "Jacqueline [name]" is and asking to go home. Redirected pt and removed restraints. Stand-pivot transfer to w/c with min guard A for safety. Dynavision used for visual scanning/locating, finger isolation, UE coordination, and attention. Pt required constant redirection to maintain focus to task, but was able to push 2 buttons consecutively within 5 minute time span. Worked on Secretary/administrator and hand eye-coordination. Pt w/ difficulty locating light buttons, often overshooting and pushing button to R or above. Pt  was able to deduce that she did not hit the button since the light did not go off, and would continue to scan to locate correct button with verbal cues. Pt perseverative on thirst; provided pt with honey thick orange juice and brought pt to day room in minimally distracting environment. Pt followed commands to open container and empty contents of  juice cup into styrofoam cup. Provided pt with spoon, required multimodal cues for appropriate bites and to utilize spoon instead of drinking from cup. Deck of cards placed on table, pt stated she did not feel like playing cards today so OT had pt assist with cleaning up 10 cards placed on table. Pt able to maintain attention for 5 cards, then required questioning cues to initiate cleaning up last 5 cards. Pt returned to room and brought to sink to wash hands. Pt initiated turning on water and placing hands under water, but did not use soap and utilized blanket to dry hands instead of paper towel. Attempted to redirect patient but pt very perseverative on returning to bed and finding "Jacqueline [name]." Stand-pivot back to bed, placed in 4 point restraints with bed alarm on and toiletries out of reach. Door left open.   Therapy Documentation Precautions:  Precautions Precautions: Cervical, Fall, Other (comment) Precaution Comments: abdominal incision Required Braces or Orthoses: Cervical Brace Cervical Brace: Hard collar, At all times Restrictions Weight Bearing Restrictions: No ADL: ADL ADL Comments: refer to functional navigator    See Function Navigator for Current Functional Status.   Therapy/Group: Individual Therapy  [name] 10/12/2016, 3:23 PM

## 2016-10-12 NOTE — Progress Notes (Signed)
Pt was restless last night on PM shift ; pulled off her mittens, restraints, and collar several times. On one occasion her trach dressing was pulled off as well. On another occasion she was found sitting up on the side of the bed after managing to get herself out of the restraints. As she was confused, she continuously called out for different people and asked for beer and cigarettes. Drainage from dressing during dressing change was light green; pt had insatiable thirst. 1 loose stool this AM. Continued to monitor.  Annita Brod, RN 10/12/2016 8:28 AM

## 2016-10-12 NOTE — Progress Notes (Signed)
Occupational Therapy Session Note  Patient Details  Name: Jacqueline [name] MRN: [id number] Date of Birth: Feb 13, 1966  Today's Date: 10/12/2016 OT Individual Time: 0930-1030 OT Individual Time Calculation (min): 60 min     Short Term Goals: Week 1:  OT Short Term Goal 1 (Week 1): Pt will demonstrate improved praxis by using deoderant in the correct manner. OT Short Term Goal 2 (Week 1): Pt will demonstrate improved sitting balance EOB with S while engaging in UB self care. OT Short Term Goal 3 (Week 1): Pt will demonstrate improved standing balance of min A while engaging in LB self care. OT Short Term Goal 4 (Week 1): Pt will attend to self care task for at least 3 minutes without redirection.  OT Short Term Goal 5 (Week 1): Pt will demonstrate improved orientation to identify that she is in a hospital.  Skilled Therapeutic Interventions/Progress Updates:    Pt in bed with restraints X 4 in place.  Pt's cervical collar laying in bed.  Pt resistant to donning cervical collar but placed appropriately prior to restraints removed and pt sitting EOB.  Pt required max multimodal cues to keep cervical collar in placed and continued throughout session to attempt to remove it.  Pt required max verbal cues to initiate donning shirt and pants.  Pt persistently asked for beer/alcohol throughout session.  Pt became verbally agitated when apple juice and orange juice provided with no alcohol.  Pt continued to ask for the can of beer under her bed and refused to accept explanation that beer/alochol was not available in hospital.  Pt refused to look at pictures of family/friends in room unless she was provided alcohol.  Pt became tearful during session when alcohol not provided.  Pt returned to bed with restraints X 4 in place and cervical collar donned; bed alarm activated.   Focus on activity tolerance, task initiation, attention to task, and safety awareness. Therapy Documentation Precautions:   Precautions Precautions: Cervical, Fall, Other (comment) Precaution Comments: abdominal incision Required Braces or Orthoses: Cervical Brace Cervical Brace: Hard collar, At all times Restrictions Weight Bearing Restrictions: No Pain:  Pt denied pain but c/o "not feeling well"  See Function Navigator for Current Functional Status.   Therapy/Group: Individual Therapy  Leroy Libman 10/12/2016, 10:45 AM

## 2016-10-12 NOTE — Progress Notes (Signed)
Speech Language Pathology Daily Session Notes  Patient Details  Name: Jacqueline [name] MRN: [id number] Date of Birth: [birth year]  Today's Date: 10/12/2016  Session 1: SLP Individual Time: [id number] SLP Individual Time Calculation (min): 60 min   Session 2: SLP Individual Time: 1130-1200 SLP Individual Time Calculation (min): 30 min    Short Term Goals: Week 1: SLP Short Term Goal 1 (Week 1): Patient will consume trials of upgraded liquids with minimal overt s/s of aspiration with Mod A verbal cues for use of slow rate over 2 sessions prior to upgrade.  SLP Short Term Goal 2 (Week 1): Patient will demonstrate focused attention to a task for 60 seconds with Max A multimodal cues.  SLP Short Term Goal 3 (Week 1): Patient will self-monitor and correct putting inapprorpriate items into her mouth with Max A multimodal cues.  SLP Short Term Goal 4 (Week 1): Patient will wear PMSV with full supervision with all vitals remaining WFL.  SLP Short Term Goal 5 (Week 1): Patient will follow 1 step commands with 75% accuracy and Max A multimodal cues.   Skilled Therapeutic Interventions:  Session 1: Skilled treatment session focused on dysphagia and cognitive goals. Upon arrival, patient was awake in bed with her neck brace off. SLP donned neck brace and patient sat EOB with extra time. Patient transferred to the toilet and had a successful bowel movement with Mod A verbal cues needed for attention and safety to task. Patient consumed her breakfast meal of Dys. 1 textures with honey-thick liquids without overt s/s of aspiration and Max A verbal and tactile cues for use of small sips and a slow rate. Patient with minimal PO intake of solids, suspect due to stomach pain. Patient with increased fatigue and requesting to get back into her bed but demonstrated decreased verbosity and restlessness this session. Patient reported she knew she was at the hospital with increased intellectual awareness of confusion  throughout session. Of note, patient made 2 comments in regards to" wanting to die even though its selfish." RN aware. Patient transferred back to bed at end of session and left with 4 point restraints and bilateral mitts in place. Continue with current plan of care.   Session 2: Skilled treatment session focused on dysphagia and cognitive goals. Upon arrival, patient was awake while supine in bed. Patient transferred to the EOB with extra time and Max A multimodal cues. Patient requesting to use the bathroom and had a successful bowel movement but required Max A multimodal cues for safety and problem solving with task. Patient got up out of the wheelchair without assistance in order to ambulate back to her bed. Patient also requesting liquids and consumed 8 oz of honey-thick liquids without overt s/s of aspiration and Max A verbal cues for use of a slow rate and maintaining an upright position. Patient transferred back to bed at end of session and left with 4 point restraints and bilateral mitts in place. Continue with current plan of care.    Function:  Eating Eating   Modified Consistency Diet: Yes Eating Assist Level: Helper feeds patient;Set up assist for;More than reasonable amount of time;Supervision or verbal cues;Helper scoops food on utensil           Cognition Comprehension Comprehension assist level: Understands basic 25 - 49% of the time/ requires cueing 50 - 75% of the time  Expression   Expression assist level: Expresses basic 25 - 49% of the time/requires cueing 50 - 75% of the time. Uses  single words/gestures.  Social Interaction Social Interaction assist level: Interacts appropriately less than 25% of the time. May be withdrawn or combative.  Problem Solving Problem solving assist level: Solves basic less than 25% of the time - needs direction nearly all the time or does not effectively solve problems and may need a restraint for safety  Memory Memory assist level: Recognizes  or recalls less than 25% of the time/requires cueing greater than 75% of the time    Pain Patient reporting stabbing pain in rib and abdominal area. RN aware and patient repositioned   Therapy/Group: Individual Therapy  Jacqueline [name], [place] 10/12/2016, 3:06 PM

## 2016-10-13 ENCOUNTER — Inpatient Hospital Stay (HOSPITAL_COMMUNITY): Payer: Medicaid Other | Admitting: Occupational Therapy

## 2016-10-13 ENCOUNTER — Inpatient Hospital Stay (HOSPITAL_COMMUNITY): Payer: Medicaid Other | Admitting: Speech Pathology

## 2016-10-13 ENCOUNTER — Inpatient Hospital Stay (HOSPITAL_COMMUNITY): Payer: Self-pay | Admitting: Occupational Therapy

## 2016-10-13 LAB — URINE CULTURE: Culture: >=100000 — AB

## 2016-10-13 LAB — BASIC METABOLIC PANEL
ANION GAP: 9 (ref 5–15)
BUN: 5 mg/dL — ABNORMAL LOW (ref 6–20)
CALCIUM: 7.9 mg/dL — AB (ref 8.9–10.3)
CO2: 26 mmol/L (ref 22–32)
Chloride: 108 mmol/L (ref 101–111)
Creatinine, Ser: 0.78 mg/dL (ref 0.44–1.00)
Glucose, Bld: 85 mg/dL (ref 65–99)
Potassium: 3.2 mmol/L — ABNORMAL LOW (ref 3.5–5.1)
Sodium: 143 mmol/L (ref 135–145)

## 2016-10-13 MED ORDER — RISPERIDONE 1 MG/ML PO SOLN
1.0000 mg | Freq: Two times a day (BID) | ORAL | Status: DC
  Administered 2016-10-13 – 2016-10-15 (×4): 1 mg via ORAL
  Filled 2016-10-13 (×5): qty 1

## 2016-10-13 MED ORDER — CEPHALEXIN 250 MG PO CAPS
500.0000 mg | ORAL_CAPSULE | Freq: Three times a day (TID) | ORAL | Status: AC
  Administered 2016-10-13 – 2016-10-17 (×15): 500 mg via ORAL
  Filled 2016-10-13 (×15): qty 2

## 2016-10-13 NOTE — Progress Notes (Signed)
Occupational Therapy Session Note  Patient Details  Name: ARAYA NORRED MRN: [id number] Date of Birth: [birth year]  Today's Date: 10/13/2016 OT Individual Time:  - 0915-1030  (75 min)       Short Term Goals: Week 1:  OT Short Term Goal 1 (Week 1): Pt will demonstrate improved praxis by using deoderant in the correct manner. OT Short Term Goal 2 (Week 1): Pt will demonstrate improved sitting balance EOB with S while engaging in UB self care. OT Short Term Goal 3 (Week 1): Pt will demonstrate improved standing balance of min A while engaging in LB self care. OT Short Term Goal 4 (Week 1): Pt will attend to self care task for at least 3 minutes without redirection.  OT Short Term Goal 5 (Week 1): Pt will demonstrate improved orientation to identify that she is in a hospital. Week 2:     Skilled Therapeutic Interventions/Progress Updates:    Ppt llying bed with CNA.  She was using the bed pan but was asking to use the toilet.  Addressed bed mobility, transfers, functional mobility, attention, orientation.  PPt went from supine to sit with min assist.  Ambulated with +2 for safety to toilet (pt= min assist).   She was continent of bowel and bladder.  She needed assist with peri care. She dpnned pants with min assist as she got feet in wrong pant hole.   She ambulated out to recliner.  Engaged in orientation to time,place, situation.  Pt displayed no rention of information after 5 minutes.  Provided honey thick coke for ppt was very thirsty.  She had good swallow control and also with hand to mouth.  Verbal reminders were provided  To keep brace on neck. PPt ambulated back to bed with min assist and placed with 4 pt restraints and bed alarm on.   Therapy Documentation Precautions:  Precautions Precautions: Cervical, Fall, Other (comment) Precaution Comments: abdominal incision Required Braces or Orthoses: Cervical Brace Cervical Brace: Hard collar, At all times Restrictions Weight Bearing  Restrictions: No General:   Vital Signs:   Pain: Pain Assessment Pain Assessment: No/denies pain Pain Score: 7  ADL: ADL ADL Comments: refer to functional navigator Exercises:   Other Treatments:    See Function Navigator for Current Functional Status.   Therapy/Group: Individual Therapy  Lisa Roca 10/13/2016, 1:02 PM

## 2016-10-13 NOTE — Progress Notes (Signed)
Alert.  Oriented to person only.  Remains in wrist and leg restraints.  Pulls off restraints frequently and scoots to end of bed.  Removes cervical collar repeatedly.  Pulls dressing off her neck and picks at dressing on her abdomen.  Very impulsive.  Has some difficulty following commands.  Is able to identify when she needs to be toileted most of the time and walks with one assist to bathroom.  Manages pants up and down and performs perineal hygiene with assistance.  Requests repeatedly to go downstairs or to the porch to smoke.

## 2016-10-13 NOTE — Progress Notes (Signed)
Speech Language Pathology Daily Session Note  Patient Details  Name: Jacqueline [name] MRN: [id number] Date of Birth: 12/26/1965  Today's Date: 10/13/2016 SLP Individual Time: 0800-0830 SLP Individual Time Calculation (min): 30 min   Short Term Goals: Week 1: SLP Short Term Goal 1 (Week 1): Patient will consume trials of upgraded liquids with minimal overt s/s of aspiration with Mod A verbal cues for use of slow rate over 2 sessions prior to upgrade.  SLP Short Term Goal 2 (Week 1): Patient will demonstrate focused attention to a task for 60 seconds with Max A multimodal cues.  SLP Short Term Goal 3 (Week 1): Patient will self-monitor and correct putting inapprorpriate items into her mouth with Max A multimodal cues.  SLP Short Term Goal 4 (Week 1): Patient will wear PMSV with full supervision with all vitals remaining WFL.  SLP Short Term Goal 5 (Week 1): Patient will follow 1 step commands with 75% accuracy and Max A multimodal cues.   Skilled Therapeutic Interventions:  Pt was seen for skilled ST targeting goals for cognition and dysphagia.  Pt needed max assist multimodal cues for rate and portion control due to impulsivity with PO intake.  No overt s/s of aspiration were evident with purees or honey thick liquids during breakfast meal and pt was able to sustain her attention to self feeding for ~60 seconds with mod assist multimodal cues for redirection to task.  Pt needed max to total assist for safety to prevent her from removing c-collar.  Pt also needed max assist multimodal cues to reorient to place and situation during functional conversations with therapist.  Pt was left resting comfortably in bed with 4 point restraints reapplied and 4 side rails up.  Continue per current plan of care.     Function:  Eating Eating   Modified Consistency Diet: Yes Eating Assist Level: Set up assist for;Supervision or verbal cues   Eating Set Up Assist For: Opening containers        Cognition Comprehension Comprehension assist level: Understands basic 50 - 74% of the time/ requires cueing 25 - 49% of the time  Expression   Expression assist level: Expresses basic 50 - 74% of the time/requires cueing 25 - 49% of the time. Needs to repeat parts of sentences.  Social Interaction Social Interaction assist level: Interacts appropriately 25 - 49% of time - Needs frequent redirection.  Problem Solving Problem solving assist level: Solves basic 25 - 49% of the time - needs direction more than half the time to initiate, plan or complete simple activities  Memory Memory assist level: Recognizes or recalls less than 25% of the time/requires cueing greater than 75% of the time    Pain Pain Assessment Pain Assessment: No/denies pain  Therapy/Group: Individual Therapy  Lora Glomski, Selinda Orion 10/13/2016, 9:17 AM

## 2016-10-13 NOTE — Progress Notes (Signed)
Occupational Therapy Session Note  Patient Details  Name: Jacqueline [name] MRN: [id number] Date of Birth: 1966/04/04  Today's Date: 10/13/2016 OT Individual Time: 1300-1430 OT Individual Time Calculation (min): 90 min     Short Term Goals: Week 1:  OT Short Term Goal 1 (Week 1): Pt will demonstrate improved praxis by using deoderant in the correct manner. OT Short Term Goal 2 (Week 1): Pt will demonstrate improved sitting balance EOB with S while engaging in UB self care. OT Short Term Goal 3 (Week 1): Pt will demonstrate improved standing balance of min A while engaging in LB self care. OT Short Term Goal 4 (Week 1): Pt will attend to self care task for at least 3 minutes without redirection.  OT Short Term Goal 5 (Week 1): Pt will demonstrate improved orientation to identify that she is in a hospital.  Skilled Therapeutic Interventions/Progress Updates:   patient focus this session was especially cognition/focus/orientation with some basic core and UE work to increase balance and independence with transfers and overall balance.  Patient very actively moving in bed (all 4 extremities restrained somewhat for patient safety as reports are that she pulls at bandages and takes off neck collar) and asking for "alcohol" as heard from the hall way before tis clinician and tech went into patient room.  She was not oriented to situation or place and spoke about places she was familiar with as if she were there.  She allowed tis clinician to don her neck collar back on (she removed it again at the end of session and thi s clinician donned it again after restrating both arms)   She was able to transfer from supine with head of bed elevated to edge of bed with independence   She  Ambulated about 2 feet with close S to sit in chair near bed where she completed tossing ball back and forth with staff for attention, focus and endurance.  She completed toilet transfer w/c to toilet (too fatigued at this  point to walk into bathroom assisted) with close S Completed toileting (cleansing bottom, etc) with Min A for clothing mgmt as pants had fallen down to ankles.  She worked on other focus activities - requiring constant redirecting verbal cues  She transferred w/c to bed with close S  Bed mobility= exgtra time for attention and processing and Min A  She attempted to pull off the bandage at her neck before this clinician could complete her left and restraint.  She was left in her bed with all 4 extremities restrained, her bed alarm engaged, and with her nurse completing medications.  Therapy Documentation Precautions:  Precautions Precautions: Cervical, Fall, Other (comment) Precaution Comments: abdominal incision Required Braces or Orthoses: Cervical Brace Cervical Brace: Hard collar, At all times Restrictions Weight Bearing Restrictions: No   Pain: "Oh my God my stomach"  RN gave meds right away    See Function Navigator for Current Functional Status.   Therapy/Group: Individual Therapy  Alfredia Ferguson [facility] 10/13/2016, 3:03 PM
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
## 2016-10-14 ENCOUNTER — Inpatient Hospital Stay (HOSPITAL_COMMUNITY): Payer: Medicaid Other | Admitting: Physical Therapy

## 2016-10-14 ENCOUNTER — Inpatient Hospital Stay (HOSPITAL_COMMUNITY): Payer: Medicaid Other

## 2016-10-14 DIAGNOSIS — F332 Major depressive disorder, recurrent severe without psychotic features: Secondary | ICD-10-CM

## 2016-10-14 DIAGNOSIS — K639 Disease of intestine, unspecified: Secondary | ICD-10-CM
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
## 2016-10-14 NOTE — Progress Notes (Signed)
Patient removed collar 5-7 times during dayshift. Is able to remove collar even while in restraints. Reminded patient of need for restraints and collar multiples times, no evidence of learning. MD notified of patient's thirst/high liquid intake, ongoing abdominal pain and taut stomach. Report given to oncoming RN.

## 2016-10-14 NOTE — Care Management Note (Signed)
[facility] Individual Statement of Services  Patient Name:  Jacqueline [name]  Date:  10/11/2016  Welcome to the [facility].  Our goal is to provide you with an individualized program based on your diagnosis and situation, designed to meet your specific needs.  With this comprehensive rehabilitation program, you will be expected to participate in at least 3 hours of rehabilitation therapies Monday-Friday, with modified therapy programming on the weekends.  Your rehabilitation program will include the following services:  Physical Therapy (PT), Occupational Therapy (OT), Speech Therapy (ST), 24 hour per day rehabilitation nursing, Therapeutic Recreaction (TR), Neuropsychology, Case Management (Social Worker), Rehabilitation Medicine, Nutrition Services and Pharmacy Services  Weekly team conferences will be held on Tuesdays to discuss your progress.  Your Social Worker will talk with you frequently to get your input and to update you on team discussions.  Team conferences with you and your family in attendance may also be held.  Expected length of stay: 18-21 days  Overall anticipated outcome: supervision/ min assist  Depending on your progress and recovery, your program may change. Your Social Worker will coordinate services and will keep you informed of any changes. Your Social Worker's name and contact numbers are listed  below.  The following services may also be recommended but are not provided by the [facility] will be made to provide these services after discharge if needed.  Arrangements include referral to agencies that provide these services.  Your insurance has been verified to be:  None  Your primary doctor is:  None  Pertinent information will be shared with your doctor and your  insurance company.  Social Worker:  [place], [place] or ([phone]   Information discussed with and copy given to patient by: Lennart Pall, 10/11/2016, 4:01 PM

## 2016-10-14 NOTE — Progress Notes (Signed)
Occupational Therapy Session Note  Patient Details  Name: Jacqueline [name] MRN: [id number] Date of Birth: June 19, 1966  Today's Date: 10/14/2016 OT Individual Time: 1100-1204 OT Individual Time Calculation (min): 64 min   Short Term Goals: Week 1:  OT Short Term Goal 1 (Week 1): Pt will demonstrate improved praxis by using deoderant in the correct manner. OT Short Term Goal 2 (Week 1): Pt will demonstrate improved sitting balance EOB with S while engaging in UB self care. OT Short Term Goal 3 (Week 1): Pt will demonstrate improved standing balance of min A while engaging in LB self care. OT Short Term Goal 4 (Week 1): Pt will attend to self care task for at least 3 minutes without redirection.  OT Short Term Goal 5 (Week 1): Pt will demonstrate improved orientation to identify that she is in a hospital.  Skilled Therapeutic Interventions/Progress Updates:   Therapeutic activity with focus on improved attention, awareness, transfers, functional mobility, standing balance, and activity tolerance.   Pt received supine in bed with 4-pt restraints fitted and all bed rails up.   Pt able to respond to therapist and +2 helper (rehab tech) when cued, 25% of the time, for up to 15-30 seconds before attention was distracted by c/o abdominal pain and thirst.   With restraints removed pt rose to sit at EOB with mod assist and performed transfer to w/c again with mod assist and tactile cues for hand placement.   Pt was challenged to wash her face and change her shirt which required continuous vc, extra time and redirection to complete d/t impaired attention.  Pt was able to wash her face and don shirt with only setup and supervision and completed hand care (using lotion) with min assist.   OT provided total assist for foot care (lotion) and patient donned her left sock when cued but was unable to don her right.   Pt continued through session with continuous redirection, intermittent rest breaks and requests for  honey-thick beverages (4 provided during session, RN made aware).   Pt ambulated 15' in room, from w/c to sink and back, with mod assist for safety due to impulsivity nd then requested return to bed d/t fatigue.   Pt completed transfer to bed and was returned to 4-pt restraints w/o objections to allow OT to correct misaligned c-collar and replace pads as needed at end of session.  Therapy Documentation Precautions:  Precautions Precautions: Cervical, Fall, Other (comment) Precaution Comments: abdominal incision Required Braces or Orthoses: Cervical Brace Cervical Brace: Hard collar, At all times Restrictions Weight Bearing Restrictions: No   Pain: Pain Assessment Pain Assessment: Faces Faces Pain Scale: Hurts whole lot Pain Type: Acute pain Pain Location: Abdomen Pain Orientation: Right Pain Descriptors / Indicators: Stabbing Pain Frequency: Constant Pain Onset: On-going Pain Intervention(s): Medication (See eMAR)   ADL: ADL ADL Comments: refer to functional navigator   See Function Navigator for Current Functional Status.   Therapy/Group: Individual Therapy   Second session: Time:  1330-1415 Time Calculation (min): 45   min  Pain Assessment: Pain Assessment Pain Assessment: Faces Faces Pain Scale: Hurts whole lot Pain Type: Acute pain Pain Location: Abdomen Pain Orientation: Right Pain Descriptors / Indicators: Stabbing Pain Frequency: Constant Pain Onset: On-going Pain Intervention(s): Medication (See eMAR)   Skilled Therapeutic Interventions: Therapeutic activity with focus on improved transfers, functional mobility, pain management, attention, awareness, orientation.  Pt received supine in bed, 4 pt restraints in place.   Pt requested use of toilet but declined immediately  after restraints were removed.   Pt required mod assist to roll to her right and rise to sit at EOB.   Pt completed stand-pivot transfer with hand-held assist during pivot.  Pt was distracted by  severe abdominal pain and thirst but was able to sustain attention to topics presented for 10-15 seconds at a time with copious breaks to present an ice chip or sip of honey-thickened liquid (water), approx 4 oz total.   Pt was escorted to rehab gym in w/c and played catch with therapist and rehab tech for 2.5 minutes before requesting to lay down at raised mat.   After min assist transfer to mat from w/c pt immediately complained of abdominal pain which was not relieved by any attempt to reposition.   Pt returned to w/c and was escorted back to her room to return to bed with extra time required to fasten restraints and assist to repositioning for comfort.    See FIM for current functional status  Therapy/Group: Individual Therapy  [place] 10/14/2016, 12:24 PM

## 2016-10-14 NOTE — Progress Notes (Signed)
Physical Therapy Session Note  Patient Details  Name: Jacqueline [name] MRN: [id number] Date of Birth: 06-03-1966  Today's Date: 10/14/2016 PT Individual Time: [id number] and [id number] PT Individual Time Calculation (min): 59 min and 36 min   Short Term Goals: Week 1:  PT Short Term Goal 1 (Week 1): Patient will perform transfers with min A.  PT Short Term Goal 2 (Week 1): Patient will ambulate 150 ft with min A.  PT Short Term Goal 3 (Week 1): Patient will negotiate up/down 12 stairs using 2 rails with min A.  PT Short Term Goal 4 (Week 1): Patient will focus attention to functional task x 60 sec with max multimodal cues.  PT Short Term Goal 5 (Week 1): Patient will be oriented to place and situation with max multimodal cues.   Skilled Therapeutic Interventions/Progress Updates:    Treatment 1: Pt received in bed reporting need to use bathroom. Session focused on dynamic balance, cognitive remediation, safety awareness, coordination, and functional use of BUE. Pt ambulated bed<>bathroom & bed<>sink without AD & min assist for balance. Pt with continent void & BM on toilet with assistance for peri hygiene to ensure cleanliness but able to manage brief then later don pants with min assist for balance. Pt participated in playing catch for coordination & attention to task. Pt with inconsistent recall of family members in pictures in room. Pt only able to recall correct age 49/3 times after being oriented by therapist. Pt perseverated on being thirsty and asked for alcohol multiple times during session. Provided max assist to allow pt to safely consume honey thick liquids with small sips. Pt stated "I'm probably depressed". Pt unable to recall current location or situation and resistive to passive orientation to time, stating "We haven't had Christmas yet". At end of session pt left in bed with all 4 limbs in restraints, 4 bed rails up, BUE mittens donned & RN present.  Pt required max cuing & assist to  leave cervical collar donned. Throughout session pt constantly loosened collar despite max multimodal cuing & redirection for appropriate tightness of collar.   Treatment 2: Pt received in bed without BUE mittens & collar donned; RN made aware. Therapist donned pt's cervical collar total assist & provided education throughout session to keep collar donned.  Pt reported need to use restroom; assisted pt to bathroom with min assist without AD. Pt observed to be incontinent of bowel and then had another continent BM on toilet. Pt with very loose, yellow stool & RN made aware. Pt required max multimodal cuing to keep hands clean, total assist with peri hygiene, & donning clean brief. Pt performed sit<>stand multiple times with steady assist/supervision. Pt returned to supine in bed & therapist donned 4 point restraints & BUE mittens. Pt attempted to pull mittens off with mouth with max cuing for redirection. At end of session pt left in bed with mittens & restraints donned, 4 bed rails up & RN aware that bed alarm is not working.   Therapy Documentation Precautions:  Precautions Precautions: Cervical, Fall, Other (comment) Precaution Comments: abdominal incision Required Braces or Orthoses: Cervical Brace Cervical Brace: Hard collar, At all times Restrictions Weight Bearing Restrictions: No   See Function Navigator for Current Functional Status.   Therapy/Group: Individual Therapy  [name] 10/14/2016, 3:40 PM

## 2016-10-15 ENCOUNTER — Inpatient Hospital Stay (HOSPITAL_COMMUNITY): Payer: Medicaid Other | Admitting: Speech Pathology

## 2016-10-15 ENCOUNTER — Inpatient Hospital Stay (HOSPITAL_COMMUNITY): Payer: Self-pay | Admitting: *Deleted

## 2016-10-15 ENCOUNTER — Inpatient Hospital Stay (HOSPITAL_COMMUNITY): Payer: Medicaid Other | Admitting: Physical Therapy

## 2016-10-15 ENCOUNTER — Inpatient Hospital Stay (HOSPITAL_COMMUNITY): Payer: Self-pay | Admitting: Speech Pathology

## 2016-10-15 LAB — BASIC METABOLIC PANEL
Anion gap: 9 (ref 5–15)
BUN: 7 mg/dL (ref 6–20)
CHLORIDE: 107 mmol/L (ref 101–111)
CO2: 25 mmol/L (ref 22–32)
CREATININE: 0.81 mg/dL (ref 0.44–1.00)
Calcium: 8.2 mg/dL — ABNORMAL LOW (ref 8.9–10.3)
GFR calc Af Amer: >60 mL/min (ref >60)
GFR calc non Af Amer: >60 mL/min (ref >60)
Glucose, Bld: 93 mg/dL (ref 65–99)
Potassium: 3.9 mmol/L (ref 3.5–5.1)
Sodium: 141 mmol/L (ref 135–145)

## 2016-10-15 MED ORDER — LOPERAMIDE HCL 1 MG/5ML PO LIQD
2.0000 mg | ORAL | Status: DC | PRN
  Administered 2016-10-15 – 2016-10-23 (×10): 2 mg via ORAL
  Filled 2016-10-15 (×14): qty 10

## 2016-10-15 MED ORDER — LORAZEPAM 0.5 MG PO TABS
0.5000 mg | ORAL_TABLET | ORAL | Status: DC | PRN
  Administered 2016-10-15 – 2016-11-26 (×11): 0.5 mg via ORAL
  Filled 2016-10-15 (×12): qty 1

## 2016-10-15 MED ORDER — LORAZEPAM 2 MG/ML IJ SOLN: 0.5000 mg | INTRAMUSCULAR | Status: DC | PRN

## 2016-10-15 MED ORDER — RISPERIDONE 1 MG/ML PO SOLN
2.0000 mg | Freq: Every day | ORAL | Status: DC
  Administered 2016-10-15 – 2016-10-23 (×9): 2 mg via ORAL
  Filled 2016-10-15 (×9): qty 2

## 2016-10-15 MED ORDER — CLONAZEPAM 0.5 MG PO TABS
0.5000 mg | ORAL_TABLET | Freq: Once | ORAL | Status: AC
  Administered 2016-10-15: 0.5 mg via ORAL
  Filled 2016-10-15: qty 1

## 2016-10-15 MED ORDER — CLONAZEPAM 0.5 MG PO TABS
1.0000 mg | ORAL_TABLET | Freq: Two times a day (BID) | ORAL | Status: DC
  Administered 2016-10-15 – 2016-11-13 (×58): 1 mg via ORAL
  Filled 2016-10-15 (×58): qty 2

## 2016-10-15 MED ORDER — VALPROATE SODIUM 250 MG/5ML PO SOLN
500.0000 mg | Freq: Three times a day (TID) | ORAL | Status: DC
  Administered 2016-10-15 – 2016-10-24 (×27): 500 mg via ORAL
  Filled 2016-10-15 (×30): qty 10

## 2016-10-15 MED ORDER — RISPERIDONE 1 MG/ML PO SOLN
0.5000 mg | Freq: Two times a day (BID) | ORAL | Status: DC
  Administered 2016-10-15 – 2016-10-24 (×18): 0.5 mg via ORAL
  Filled 2016-10-15 (×19): qty 0.5

## 2016-10-15 NOTE — Progress Notes (Signed)
Patient ID: Jacqueline [name], female   DOB: 1966/01/06, 51 y.o.   MRN: [id number]   LOS: 6 days   Subjective: Asked to see pt by CIR PA regarding midline wound dehiscence? Pt is making progress on CIR. RN reports small hole present on Friday with moderate amount tan drainage without odor. Today with another hole in wound.   Objective: Vital signs in last 24 hours: Temp:  [98.2 F (36.8 C)-98.6 F (37 C)] 98.6 F (37 C) (01/08 0443) Pulse Rate:  [99-109] 99 (01/08 0443) Resp:  [12-16] 12 (01/08 0443) BP: (117-119)/(65-78) 117/65 (01/08 0443) SpO2:  [99 %-100 %] 100 % (01/08 0443) Weight:  [83.3 kg (183 lb 10.3 oz)] 83.3 kg (183 lb 10.3 oz) (01/08 0443) Last BM Date: 10/14/16   Laboratory  BMET  Recent Labs  10/13/16 0451 10/15/16 0418  NA 143 141  K 3.2* 3.9  CL 108 107  CO2 26 25  GLUCOSE 85 93  BUN 5* 7  CREATININE 0.78 0.81  CALCIUM 7.9* 8.2*       Assessment/Plan: S/p ex lap -- Wound appears clean without odor or expressible discharge. Will monitor this week, continue wet-to-dry dressings bid.    Lisette Abu, PA-C Pager: [phone] General Trauma PA Pager: [phone]  10/15/2016

## 2016-10-15 NOTE — Progress Notes (Signed)
Patient removed collar and restraints multiply times throughout the night.

## 2016-10-15 NOTE — Progress Notes (Signed)
Occupational Therapy Session Note  Patient Details  Name: Jacqueline [name] MRN: [id number] Date of Birth: 02/14/1966  Today's Date: 10/15/2016 OT Individual Time: 0800-0900 OT Individual Time Calculation (min): 60 min     Short Term Goals: Week 1:  OT Short Term Goal 1 (Week 1): Pt will demonstrate improved praxis by using deoderant in the correct manner. OT Short Term Goal 2 (Week 1): Pt will demonstrate improved sitting balance EOB with S while engaging in UB self care. OT Short Term Goal 3 (Week 1): Pt will demonstrate improved standing balance of min A while engaging in LB self care. OT Short Term Goal 4 (Week 1): Pt will attend to self care task for at least 3 minutes without redirection.  OT Short Term Goal 5 (Week 1): Pt will demonstrate improved orientation to identify that she is in a hospital.  Skilled Therapeutic Interventions/Progress Updates:    Pt in bed with restraints X 4 in place.  Pt not oriented to place, date, or situation.  Pt persistently asked where she was throughout session.  Pt required tot A for orientation to place, city, or state.  Pt continually requested something to drink throughout session and consumed 20 oz of honey thick fluids.  Pt donned pants with steady A and did not have any clean shirts.  Pt refused washing her face.  Pt provided inconsistent replies when questioned about pictures of family members in room.  Pt requested lotion for her hands.  When lotion placed on her hands at request, pt immediately placed in her mouth. I explained that lotion is for her hands. Pt did not respond.  Pt returned to bed with restraints X 4 in place and bed alarm activated.  Therapy Documentation Precautions:  Precautions Precautions: Cervical, Fall, Other (comment) Precaution Comments: abdominal incision Required Braces or Orthoses: Cervical Brace Cervical Brace: Hard collar, At all times Restrictions Weight Bearing Restrictions: No   Pain:  Pt denied pain  See  Function Navigator for Current Functional Status.   Therapy/Group: Individual Therapy  Leroy Libman 10/15/2016, 10:07 AM

## 2016-10-15 NOTE — Progress Notes (Signed)
Physical Therapy Session Note  Patient Details  Name: Jacqueline [name] MRN: [id number] Date of Birth: 1965/11/30  Pt missed 30 min PT due to extreme lethargy; pt did not sleep most of the night and per RN sleep is priority for pt at this time. Continue POC as appropriate.  Therapy Documentation  General: PT Amount of Missed Time (min): 30 Minutes PT Missed Treatment Reason: Patient fatigue   See Function Navigator for Current Functional Status.    Benjiman Core Tygielski 10/15/2016, 3:36 PM

## 2016-10-15 NOTE — Progress Notes (Signed)
Physical Therapy Session Note  Patient Details  Name: Jacqueline [name] MRN: [id number] Date of Birth: 1966/04/30  Today's Date: 10/15/2016 PT Individual Time: [id number] and [id number] PT Individual Time Calculation (min): 45 min and 26 min   Short Term Goals: Week 1:  PT Short Term Goal 1 (Week 1): Patient will perform transfers with min A.  PT Short Term Goal 2 (Week 1): Patient will ambulate 150 ft with min A.  PT Short Term Goal 3 (Week 1): Patient will negotiate up/down 12 stairs using 2 rails with min A.  PT Short Term Goal 4 (Week 1): Patient will focus attention to functional task x 60 sec with max multimodal cues.  PT Short Term Goal 5 (Week 1): Patient will be oriented to place and situation with max multimodal cues.   Skilled Therapeutic Interventions/Progress Updates:    Treatment 1: Pt received in handoff from SLP. Pt demonstrated behaviors, and reported, stomach pain & nausea; RN aware. Session focused on cognitive remediation, attention to task, gait training for neuro re-ed and dynamic balance. Pt perseverating on "I need to go home" and unable to be redirected despite multiple multimodal attempts. Pt able to ambulate around nurses station (50 ft + 100 ft) with min assist and no AD with lateral sway and inconsistent step length & width. Attempted to have pt sort cards but pt unable to recall correct suits & required max assist to attend to task. Pt utilized dynavision but only able to attend to task 5-10 seconds with max cuing. Pt required max cuing to leave c-collar donned. At end of session pt left in bed with 4 point restraints, BUE mittens donned, & 4 bed rails up, RN present.   Treatment 2: Pt received in bed; pt reported "my stomach hurts" & RN aware. Pt reported need to use bathroom and ambulated bed<>bathroom without AD & min assist but with max cuing to attend to task. Pt with continent BM on elevated toilet seat (watery stool) with total assist for peri-hygiene to ensure  cleanliness. Attempted to engage pt in game of catch with beach ball but pt attempted to eat & lick ball instead. Pt perseverated on being thirsty & provided hand over hand max assist to ensure pt consumed small sips. At end of session pt assisted back to bed with 4 point restraints on, 4 bed rails up & RN present in room.  During session pt required max assist to keep c-collar donned. Passively oriented pt to location & situation during tx session.   Therapy Documentation Precautions:  Precautions Precautions: Cervical, Fall, Other (comment) Precaution Comments: abdominal incision Required Braces or Orthoses: Cervical Brace Cervical Brace: Hard collar, At all times Restrictions Weight Bearing Restrictions: No   General: PT Amount of Missed Time (min): 15 Minutes PT Missed Treatment Reason:  (fatigue, pain)   See Function Navigator for Current Functional Status.   Therapy/Group: Individual Therapy  [name] 10/15/2016, 3:00 PM

## 2016-10-15 NOTE — Progress Notes (Signed)
Occupational Therapy Note  Patient Details  Name: Jacqueline [name] MRN: [id number] Date of Birth: Jan 20, 1966  Today's Date: 10/15/2016 OT Individual Time: 1400-1415 OT Individual Time Calculation (min): 15 min  and Today's Date: 10/15/2016 OT Missed Time: 15 Minutes Missed Time Reason: Patient fatigue    Pt denied pain Individual therapy  Pt asleep upon arrival.  Attempted to engage pt in sitting tasks at EOB.  Pt required max multimodal cues to keep her eyes open and unable to follow one step commands secondary to lethargy.  RN aware.  Pt returned to supine with C-collar, and restraints X 4 in place.  Pt missed 15 mins skilled OT services secondary to lethargy and inability to actively participate in therapy.     [name] [facility] 10/15/2016, 2:30 PM
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
## 2016-10-15 NOTE — Progress Notes (Signed)
Speech Language Pathology Daily Session Note  Patient Details  Name: ELISABETTA OPPERMANN MRN: [id number] Date of Birth: 1966-01-04  Today's Date: 10/15/2016 SLP Individual Time: 1000-1100 SLP Individual Time Calculation (min): 60 min   Short Term Goals: Week 1: SLP Short Term Goal 1 (Week 1): Patient will consume trials of upgraded liquids with minimal overt s/s of aspiration with Mod A verbal cues for use of slow rate over 2 sessions prior to upgrade.  SLP Short Term Goal 2 (Week 1): Patient will demonstrate focused attention to a task for 60 seconds with Max A multimodal cues.  SLP Short Term Goal 3 (Week 1): Patient will self-monitor and correct putting inapprorpriate items into her mouth with Max A multimodal cues.  SLP Short Term Goal 4 (Week 1): Patient will wear PMSV with full supervision with all vitals remaining WFL.  SLP Short Term Goal 5 (Week 1): Patient will follow 1 step commands with 75% accuracy and Max A multimodal cues.   Skilled Therapeutic Interventions: Skilled treatment session focused on cognitive goals. SLP facilitated session by providing Max A verbal cues for problem solving during transfer to the commode and self-care tasks X 2 throughout session after 2 successful bowel movements, RN aware. Patient with language of confusion and reporting stomach pain throughout session, RN aware. Trauma physician came to look at patient's abdominal wound throughout session which cased minor verbal agitation as patient had to lay still while supine in bed. Patient required Max A verbal cues to follow commands and to leave neck brace in place. SLP attempted to allow patient to calm down due to increased restlessness by modifying environment and minimizing demands, however, unsuccessful. Patient handed off to PT at end of session. Continue with current plan of care.   Function:  Eating Eating   Modified Consistency Diet: Yes Eating Assist Level: Set up assist for;Supervision or verbal  cues   Eating Set Up Assist For: Opening containers       Cognition Comprehension Comprehension assist level: Understands basic 50 - 74% of the time/ requires cueing 25 - 49% of the time  Expression   Expression assist level: Expresses basic 50 - 74% of the time/requires cueing 25 - 49% of the time. Needs to repeat parts of sentences.  Social Interaction Social Interaction assist level: Interacts appropriately 25 - 49% of time - Needs frequent redirection.  Problem Solving Problem solving assist level: Solves basic 25 - 49% of the time - needs direction more than half the time to initiate, plan or complete simple activities  Memory Memory assist level: Recognizes or recalls less than 25% of the time/requires cueing greater than 75% of the time    Pain Patient reports stomach pain, unable to rate. RN aware and patient premedicated   Therapy/Group: Individual Therapy  Princetta Uplinger 10/15/2016, 12:17 PM

## 2016-10-16 ENCOUNTER — Inpatient Hospital Stay (HOSPITAL_COMMUNITY): Payer: Medicaid Other | Admitting: Occupational Therapy

## 2016-10-16 ENCOUNTER — Inpatient Hospital Stay (HOSPITAL_COMMUNITY): Payer: Self-pay

## 2016-10-16 ENCOUNTER — Inpatient Hospital Stay (HOSPITAL_COMMUNITY): Payer: Self-pay | Admitting: Physical Therapy

## 2016-10-16 ENCOUNTER — Inpatient Hospital Stay (HOSPITAL_COMMUNITY): Payer: Self-pay | Admitting: Speech Pathology

## 2016-10-16 NOTE — Progress Notes (Signed)
Abd. dressing changed for large amount tan/greenish drainage x 2 in 12 hours. Open area at upper end of incision line tracts 4 cms around with 6cm area  tunneling at 6:00. Lower open area tracts 3.5 cms at 11:00. Dr. Naaman Plummer aware. Pt has removed dressing x2; abd.binder on. Pt has had 2 moderate loose stools; imodium given x1; stools are clay colored. Abd. distended, non-tender to palpation. Continues to c/o abd. pain, Hycet effective. Large amounts of gas, Mylicon scheduled.

## 2016-10-16 NOTE — Progress Notes (Signed)
Physical Therapy Session Note  Patient Details  Name: Jacqueline [name] MRN: [id number] Date of Birth: 1966-04-23  Today's Date: 10/16/2016 PT Individual Time: 1300-1400 PT Individual Time Calculation (min): 60 min   Skilled Therapeutic Interventions/Progress Updates:    Session focused on addressing functional mobility and cognitive remediation throughout session. PT donned cervical collar prior to mobility. Pt requires overall min assist for transfers and gait without assistive device with cues for safety due to impulsivity. Pt requires max encouragement and redirection throughout session due to impaired attention and perseverating on laying down. Pt instructed in functional task to gather tablecloths and place them in a pre-identified location. Completed 2/3 before requiring return to bathroom for urgent BM and returned to task. Required max encouragement to complete task and overall min assist for balance and ambulation. W/c mobility throughout unit with focus on attention and use of BLE/BUE to propel w/c with goal to return to pt's room and lay down in bed. End of sessio returned to bed with min assist and 4 point restraints and bed alarm intact.   Therapy Documentation Precautions:  Precautions Precautions: Cervical, Fall, Other (comment) Precaution Comments: abdominal incision Required Braces or Orthoses: Cervical Brace Cervical Brace: Hard collar, At all times Restrictions Weight Bearing Restrictions: No  Pain:  c/o abdominal pain and back pain. Repositioned and redirected throughout.    See Function Navigator for Current Functional Status.   Therapy/Group: Individual Therapy  Jacqueline [name], PT, DPT  10/16/2016, 2:05 PM

## 2016-10-16 NOTE — Progress Notes (Addendum)
Unable to put Aspen collar back on because a piece is broken on it and it is sharp. Will pass on in shift change. Pt slept til 2230. Gave scheduled meds and prn meds to help pt to go back to sleep. At this time pt is restless and asking for a "beer".

## 2016-10-16 NOTE — Progress Notes (Signed)
Speech Language Pathology Daily Session Note  Patient Details  Name: Jacqueline [name] MRN: [id number] Date of Birth: Sep 07, 1966  Today's Date: 10/16/2016 SLP Individual Time: 1100-1153 SLP Individual Time Calculation (min): 53 min   Short Term Goals: Week 1: SLP Short Term Goal 1 (Week 1): Patient will consume trials of upgraded liquids with minimal overt s/s of aspiration with Mod A verbal cues for use of slow rate over 2 sessions prior to upgrade.  SLP Short Term Goal 2 (Week 1): Patient will demonstrate focused attention to a task for 60 seconds with Max A multimodal cues.  SLP Short Term Goal 3 (Week 1): Patient will self-monitor and correct putting inapprorpriate items into her mouth with Max A multimodal cues.  SLP Short Term Goal 4 (Week 1): Patient will wear PMSV with full supervision with all vitals remaining WFL.  SLP Short Term Goal 5 (Week 1): Patient will follow 1 step commands with 75% accuracy and Max A multimodal cues.   Skilled Therapeutic Interventions: Skilled treatment session focused on dysphagia and cognitive goals. SLP facilitated session with trials of nectar-thick liquids. Patient consumed ~4-6 oz of nectar-thick liquids in large, sequential sips without overt s/s of aspiration. Therefore, recommend patient upgrade to nectar-thick liquids. Patient consistently moaning out in pain while reporting her stomach hurt. RN and medical team aware. Patient performed a basic sorting task with extra time, Min A for problem solving and Max A for encouragement. Patient requesting to go back to bed due to pain. Patient transferred back to bed and education provided to patient's family in regards to her current swallowing and cognitive function and goals of skilled SLP intervention. All verbalized understanding. Continue with current plan of care.   Function:  Cognition Comprehension Comprehension assist level: Understands basic 50 - 74% of the time/ requires cueing 25 - 49% of the  time  Expression   Expression assist level: Expresses basic 50 - 74% of the time/requires cueing 25 - 49% of the time. Needs to repeat parts of sentences.  Social Interaction Social Interaction assist level: Interacts appropriately 25 - 49% of time - Needs frequent redirection.  Problem Solving Problem solving assist level: Solves basic 25 - 49% of the time - needs direction more than half the time to initiate, plan or complete simple activities  Memory Memory assist level: Recognizes or recalls less than 25% of the time/requires cueing greater than 75% of the time    Pain Stomach pain, RN made aware and administered medications   Therapy/Group: Individual Therapy  Jacqueline [name] 10/16/2016, 2:24 PM

## 2016-10-16 NOTE — Progress Notes (Signed)
Physical Therapy Session Note  Patient Details  Name: Jacqueline [name] MRN: [id number] Date of Birth: Jan 27, 1966  Today's Date: 10/16/2016 PT Individual Time: [id number]  PT Individual Time Calculation (min): 54 min    Short Term Goals: Week 1:  PT Short Term Goal 1 (Week 1): Patient will perform transfers with min A.  PT Short Term Goal 2 (Week 1): Patient will ambulate 150 ft with min A.  PT Short Term Goal 3 (Week 1): Patient will negotiate up/down 12 stairs using 2 rails with min A.  PT Short Term Goal 4 (Week 1): Patient will focus attention to functional task x 60 sec with max multimodal cues.  PT Short Term Goal 5 (Week 1): Patient will be oriented to place and situation with max multimodal cues.   Skilled Therapeutic Interventions/Progress Updates:    Treatment 1: Pt received in geri-chair. Pt did not demonstrate any behaviors indicating pain during session. Session focused on cognitive remediation, gait without AD for dynamic balance, and functional use of BUE. Pt consumed breakfast with supervision from therapist with cuing for small bites & sips. Pt with no awareness and attempted to mix apple juice with grits & unable to self-correct. While eating breakfast therapist oriented pt to place, time and situation. Pt with no carryover of information during session. Pt ambulated up to 100 ft without AD and min assist with inconsistent step length & width. Pt with continent void on toilet & able to manage clothing & perform peri hygiene with min assist for standing balance. Pt required frequent cuing to keep cervical collar donned. At end of session pt left in geri chair with QRB donned and at nurses station.   Treatment 2: Pt received asleep in bed. Pt would open eyes with verbal/tactile stimulation, make a statement then lay head back down with eyes closed repeatedly. RN notified of this & pt's cervical collar not donned while in bed. Pt missed 30 minutes skilled PT treatment 2/2  lethargy.  Therapy Documentation Precautions:  Precautions Precautions: Cervical, Fall, Other (comment) Precaution Comments: abdominal incision Required Braces or Orthoses: Cervical Brace Cervical Brace: Hard collar, At all times Restrictions Weight Bearing Restrictions: No   General: PT Amount of Missed Time (min): 30 Minutes PT Missed Treatment Reason:  (lethargy)   See Function Navigator for Current Functional Status.   Therapy/Group: Individual Therapy  [name] 10/16/2016, 9:21 AM

## 2016-10-16 NOTE — Progress Notes (Signed)
Pt slept for about 7hrs total. She is up at this moment. Had 2 cups of honey thicken drinks. Also, given Hycet because pt is c/o abd and back pain.

## 2016-10-16 NOTE — Progress Notes (Signed)
Occupational Therapy Session Note  Patient Details  Name: Jacqueline [name] MRN: [id number] Date of Birth: 05-May-1966  Today's Date: 10/16/2016 OT Individual Time: [id number] OT Individual Time Calculation (min): 44 min     Short Term Goals: Week 1:  OT Short Term Goal 1 (Week 1): Pt will demonstrate improved praxis by using deoderant in the correct manner. OT Short Term Goal 2 (Week 1): Pt will demonstrate improved sitting balance EOB with S while engaging in UB self care. OT Short Term Goal 3 (Week 1): Pt will demonstrate improved standing balance of min A while engaging in LB self care. OT Short Term Goal 4 (Week 1): Pt will attend to self care task for at least 3 minutes without redirection.  OT Short Term Goal 5 (Week 1): Pt will demonstrate improved orientation to identify that she is in a hospital.  Skilled Therapeutic Interventions/Progress Updates:    Pt resting in geri-chair at nursing station.  Attempted to engage pt in bathing tasks but pt persistently refused to use wash cloth for any grooming tasks.  Pt perseverated on wanting something to drink and consumed 16 oz honey thick liquids during session.  Pt unable to consistently identify persons in pictures in room.  Pt repeatedly required max verbal cues for orientation to place, year, and situation. Pt c/o abdominal pain throughout session and RN notified and meds administered.  Pt became lethargic near the end of the session and was unable to keep her eyes open or actively participate in therapy.  Pt returned to bed with restraints X 4 in place and bed alarm activated.   Therapy Documentation Precautions:  Precautions Precautions: Cervical, Fall, Other (comment) Precaution Comments: abdominal incision Required Braces or Orthoses: Cervical Brace Cervical Brace: Hard collar, At all times Restrictions Weight Bearing Restrictions: No General: General OT Amount of Missed Time: 16 Minutes Pain: Pt c/o unspecified abdominal pain;  RN aware and repositioned  See Function Navigator for Current Functional Status.   Therapy/Group: Individual Therapy  Leroy Libman 10/16/2016, 9:57 AM
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
## 2016-10-17 ENCOUNTER — Inpatient Hospital Stay (HOSPITAL_COMMUNITY): Payer: Medicaid Other | Admitting: Physical Therapy

## 2016-10-17 ENCOUNTER — Inpatient Hospital Stay (HOSPITAL_COMMUNITY): Payer: Self-pay | Admitting: *Deleted

## 2016-10-17 ENCOUNTER — Inpatient Hospital Stay (HOSPITAL_COMMUNITY): Payer: Self-pay | Admitting: Physical Therapy

## 2016-10-17 ENCOUNTER — Inpatient Hospital Stay (HOSPITAL_COMMUNITY): Payer: Medicaid Other | Admitting: Speech Pathology

## 2016-10-17 MED ORDER — SACCHAROMYCES BOULARDII 250 MG PO CAPS
250.0000 mg | ORAL_CAPSULE | Freq: Two times a day (BID) | ORAL | Status: DC
  Administered 2016-10-17 – 2016-10-29 (×26): 250 mg via ORAL
  Filled 2016-10-17 (×28): qty 1

## 2016-10-17 MED ORDER — POTASSIUM CHLORIDE 20 MEQ/15ML (10%) PO SOLN
20.0000 meq | Freq: Three times a day (TID) | ORAL | Status: DC
  Administered 2016-10-17 – 2016-10-24 (×21): 20 meq via ORAL
  Filled 2016-10-17 (×21): qty 15

## 2016-10-17 MED ORDER — CALCIUM POLYCARBOPHIL 625 MG PO TABS
625.0000 mg | ORAL_TABLET | Freq: Every day | ORAL | Status: DC
  Administered 2016-10-17 – 2016-12-11 (×55): 625 mg via ORAL
  Filled 2016-10-17 (×58): qty 1

## 2016-10-17 NOTE — Progress Notes (Signed)
Orthopedic Tech Progress Note Patient Details:  Jacqueline [name] [birth year] [id number]  Ortho Devices Type of Ortho Device: Abdominal binder Ortho Device/Splint Location: abdomen Ortho Device/Splint Interventions: Loanne Drilling, Carmyn Hamm 10/17/2016, 3:24 PM Viewed order from RN order list

## 2016-10-17 NOTE — Progress Notes (Signed)
Physical Therapy Session Note  Patient Details  Name: Jacqueline [name] MRN: [id number] Date of Birth: 25-Jan-1966  Today's Date: 10/17/2016 PT Individual Time: 0832-0901 PT Individual Time Calculation (min): 29 min    Short Term Goals: Week 1:  PT Short Term Goal 1 (Week 1): Patient will perform transfers with min A.  PT Short Term Goal 2 (Week 1): Patient will ambulate 150 ft with min A.  PT Short Term Goal 3 (Week 1): Patient will negotiate up/down 12 stairs using 2 rails with min A.  PT Short Term Goal 4 (Week 1): Patient will focus attention to functional task x 60 sec with max multimodal cues.  PT Short Term Goal 5 (Week 1): Patient will be oriented to place and situation with max multimodal cues.   Skilled Therapeutic Interventions/Progress Updates:      Pt received supine in bed and agreeable to PT. Supine>sit transfer with supervision assist and  Min cues for  Safety. PT applied patients cervical collar once in sitting.  Patient able to report that she is in hospital.   Gait in room to attempt sorting task for towels and wash clothes. Patient noted to have had incontinent episode, and required clean brief. Ambulatory Transfer to and from toilet with min assist to prevent running into door frame.   PT performed Clothing management due to incontinent episode at toilet and also performed posterior perineal hygiene to clean and dry patient.   Gait to [facility] in room to allow PT change sheets in bed. Patient able to remain sitting per PT instructions x 5 minutes without attempting to exit WC. Multiple redirects required to keep patient form drinking lotion and saliene solution throughout treatment   Returned to supine in bed with supervision assist and PT applied 4 point restraints.    Therapy Documentation Precautions:  Precautions Precautions: Cervical, Fall, Other (comment) Precaution Comments: abdominal incision Required Braces or Orthoses: Cervical Brace Cervical Brace: Hard  collar, At all times Restrictions Weight Bearing Restrictions: No General:   Vital Signs: Therapy Vitals Temp: 98.3 F (36.8 C) Temp Source: Axillary Pulse Rate: 98 Resp: 20 BP: 110/71 Patient Position (if appropriate): Lying Oxygen Therapy SpO2: 99 % O2 Device: Not Delivered Pain: 0/10   See Function Navigator for Current Functional Status.   Therapy/Group: Individual Therapy  Lorie Phenix 10/17/2016, 9:06 AM

## 2016-10-17 NOTE — Consult Note (Addendum)
WOC follow-up: Pt previously had a pressure injury related to the trach faceplate.  Lurline Idol has been discontinued and the previous wound has healed.  Previous trach stoma hole remains and is covered with foam dressing until healed. No further role for [place]. Please re-consult if further assistance is needed.  Thank-you,  Julien Girt MSN, [place], [place], [place], [place]
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
## 2016-10-17 NOTE — Progress Notes (Signed)
Occupational Therapy Session Note  Patient Details  Name: Jacqueline [name] MRN: [id number] Date of Birth: 1966-03-24  Today's Date: 10/17/2016 OT Individual Time: 0900-1000 OT Individual Time Calculation (min): 60 min     Short Term Goals: Week 2:  OT Short Term Goal 1 (Week 2): Pt will demonstrate improved praxis by using deoderant in the correct manner with min verbal cues OT Short Term Goal 2 (Week 2): Pt will attend to self care task for at least 3 minutes without redirection.  OT Short Term Goal 3 (Week 2): Pt will demonstrate improved orientation to identify that she is in a hospital with mod verbal cues  Skilled Therapeutic Interventions/Progress Updates:    Pt resting in bed with restraints X 4 in place.  Pt sat EOB and engaged in self feeding tasks with focus on task initiation, sequencing, attention to task, impulse control, and portion management.  Pt perseverated on having ice water despite explanation that she could not have ice.  Pt consumed 24 oz of fluids during session and continued to request more fluids.  Pt consumed approx 80% of her meal with max verbal cues for attention to task and sequencing.  Pt not oriented to place, city, year, her age, or situation.  Pt able to identify hospital from a field of two.  Pt donned clothing when provided requiring steady A when standing to pull up pants.  Pt returned to bed with restraints X 4 and bed alarm activated.  Therapy Documentation Precautions:  Precautions Precautions: Cervical, Fall, Other (comment) Precaution Comments: abdominal incision Required Braces or Orthoses: Cervical Brace Cervical Brace: Hard collar, At all times Restrictions Weight Bearing Restrictions: No Pain:  Pt denied pain  See Function Navigator for Current Functional Status.   Therapy/Group: Individual Therapy  Leroy Libman 10/17/2016, 10:06 AM

## 2016-10-17 NOTE — Progress Notes (Signed)
Physical Therapy Session Note  Patient Details  Name: Jacqueline [name] MRN: [id number] Date of Birth: Sep 10, 1966  Today's Date: 10/17/2016 PT Individual Time: 1100-1200 and [id number] PT Individual Time Calculation (min): 60 min and 56 min   Short Term Goals: Week 1:  PT Short Term Goal 1 (Week 1): Patient will perform transfers with min A.  PT Short Term Goal 2 (Week 1): Patient will ambulate 150 ft with min A.  PT Short Term Goal 3 (Week 1): Patient will negotiate up/down 12 stairs using 2 rails with min A.  PT Short Term Goal 4 (Week 1): Patient will focus attention to functional task x 60 sec with max multimodal cues.  PT Short Term Goal 5 (Week 1): Patient will be oriented to place and situation with max multimodal cues.   Skilled Therapeutic Interventions/Progress Updates:    Treatment 1: Pt received on toilet with NT & rehab tech present. Pt's mother present but stepped out for session. Pt did not have cervical collar donned & this therapist could only find half of it in room; PA Linna Hoff) verbally cleared pt to participate in therapy without cervical collar donned. Pt with (+) BM & total assist for peri-hygiene to ensure cleanliness. Session focused on cognitive remediation & functional mobility. Pt requires min assist to ambulate without assistive device up to 10 ft. Attempted to engage pt in seated kick ball, sorting coins & cards, and propelling w/c. Pt able to demonstrate sustained attention up to a max of 30 seconds with max cuing. Pt perseverated on being thirsty throughout session; therapist provided max assist to allow pt to consume small sips of thickened liquid. Pt appeared lethargic throughout session & c/o abdominal pain; RN made aware & meds administered during session. Pt required max cuing to not touch abdominal dressing but pulled entire dressing out of wound; RN made aware. At end of session pt assisted back to bed when she had incontinent BM; assisted pt to toilet & left pt in  care of NT & RN.  Treatment 2: Pt received in bed. Pt without any behaviors demonstrating pain during session. Session focused on dynamic balance during gait, functional mobility, and cognitive remediation. Pt ambulated up to 150 ft without AD & min assist. Pt fatigues quickly and requires max assist for stand>sit for eccentric control. Pt engaged in seated kick ball & placing pegs in peg board with sustained attention up to 45 seconds. Passively oriented pt to place & time during session; pt unable to recall situation or location. Pt required cuing to recall birthday 1 out of 2 occasions. Pt utilized BUE to consume magic cup and had no awareness to remove lid prior. Pt required max assist to consume small sips of thickened liquid and max cuing to keep cervical collar donned. Therapist provided total assist to don abdominal binder. At end of session pt left in bed with 4 point restraints, 4 bed rails up, c-collar & BUE mittens donned.   Therapy Documentation Precautions:  Precautions Precautions: Cervical, Fall, Other (comment) Precaution Comments: abdominal incision Required Braces or Orthoses: Cervical Brace Cervical Brace: Hard collar, At all times Restrictions Weight Bearing Restrictions: No    See Function Navigator for Current Functional Status.   Therapy/Group: Individual Therapy  [name] 10/17/2016, 4:33 PM

## 2016-10-17 NOTE — Progress Notes (Signed)
Physical Therapy Weekly Progress Note  Patient Details  Name: Jacqueline [name] MRN: [id number] Date of Birth: 11/20/1965  Beginning of progress report period: October 10, 2016 End of progress report period: October 17, 2016  Today's Date: 10/17/2016 PT Individual Time: 8110-3159 PT Individual Time Calculation (min): 56 min    Patient has met 2 of 5 short term goals.  Pt is making slow progress towards functional goals; pt is limited by abdominal pain, poor endurance, and impaired cognition. Pt continues to require max cuing to maintain precautions (don cervical collar) and is only able to occasionally attend to tasks for 30-45 seconds with max multimodal cuing in controlled environments. Pt perseverates on various things throughout treatment sessions & requires max cuing for redirection. Pt demonstrates behaviors consistent with Rancho Level IV emerging V.  Patient continues to demonstrate the following deficits muscle weakness, decreased cardiorespiratoy endurance, decreased coordination, decreased initiation, decreased attention, decreased awareness, decreased problem solving, decreased safety awareness, decreased memory and delayed processing, and decreased sitting balance, decreased standing balance, decreased postural control, decreased balance strategies and difficulty maintaining precautions and therefore will continue to benefit from skilled PT intervention to increase functional independence with mobility.  Patient progressing toward long term goals..  Continue plan of care.  PT Short Term Goals Week 1:  PT Short Term Goal 1 (Week 1):  Patient will perform transfers with min A.  PT Short Term Goal 1 - Progress (Week 1): Met PT Short Term Goal 2 (Week 1): Patient will ambulate 150 ft with min A.  PT Short Term Goal 2 - Progress (Week 1): Met PT Short Term Goal 3 (Week 1): Patient will negotiate up/down 12 stairs using 2 rails with min A.  PT Short Term Goal 3 - Progress (Week 1): Not  met PT Short Term Goal 4 (Week 1): Patient will focus attention to functional task x 60 sec with max multimodal cues.  PT Short Term Goal 4 - Progress (Week 1): Not met PT Short Term Goal 5 (Week 1): Patient will be oriented to place and situation with max multimodal cues. PT Short Term Goal 5 - Progress (Week 1): Not met Week 2:  PT Short Term Goal 1 (Week 2): Pt will negotiate 12 steps with B rails & min assist.  PT Short Term Goal 2 (Week 2): Pt will be oriented to place & situation with max multimodal cuing. PT Short Term Goal 3 (Week 2): Pt will focus attention to functioanl task x 60 seconds with max multimodal cuing. PT Short Term Goal 4 (Week 2): Pt will ambulate with supervision.    Precautions:  Precautions Precautions: Cervical, Fall, Other (comment) Precaution Comments: abdominal incision Required Braces or Orthoses: Cervical Brace Cervical Brace: Hard collar, At all times Restrictions Weight Bearing Restrictions: No  See Function Navigator for Current Functional Status.   [name] 10/17/2016, 5:36 PM

## 2016-10-17 NOTE — Progress Notes (Signed)
Patient ID: Jacqueline [name], female   DOB: November 12, 1965, 51 y.o.   MRN: [id number]   LOS: 8 days   Subjective: NSC   Objective: Vital signs in last 24 hours: Temp:  [98.3 F (36.8 C)-99.7 F (37.6 C)] 98.3 F (36.8 C) (01/10 0618) Pulse Rate:  [98-118] 98 (01/10 0618) Resp:  [20] 20 (01/10 0618) BP: (98-110)/(59-71) 110/71 (01/10 0618) SpO2:  [99 %-100 %] 99 % (01/10 0618) Weight:  [83.6 kg (184 lb 4.9 oz)] 83.6 kg (184 lb 4.9 oz) (01/10 0618) Last BM Date: 10/16/16 (x3)   Physical Exam      Assessment/Plan: S/p ex lap -- Wound appears clean without odor or expressible discharge. Slightly more breakdown in superior portion of wound. Will see again on Friday, continue wet-to-dry dressings bid.    Lisette Abu, PA-C Pager: [phone] General Trauma PA Pager: [phone]  10/17/2016

## 2016-10-17 NOTE — Progress Notes (Signed)
Speech Language Pathology Weekly Progress and Session Note  Patient Details  Name: Jacqueline [name] MRN: [id number] Date of Birth: [birth year]  Beginning of progress report period: October 11, 2015 End of progress report period: October 18, 2015  Today's Date: 10/17/2016 SLP Individual Time: 1300-1400 SLP Individual Time Calculation (min): 60 min   Short Term Goals: Week 1: SLP Short Term Goal 1 (Week 1): Patient will consume trials of upgraded liquids with minimal overt s/s of aspiration with Mod A verbal cues for use of slow rate over 2 sessions prior to upgrade.  SLP Short Term Goal 1 - Progress (Week 1): Met SLP Short Term Goal 2 (Week 1): Patient will demonstrate focused attention to a task for 60 seconds with Max A multimodal cues.  SLP Short Term Goal 2 - Progress (Week 1): Not met SLP Short Term Goal 3 (Week 1): Patient will self-monitor and correct putting inapprorpriate items into her mouth with Max A multimodal cues.  SLP Short Term Goal 3 - Progress (Week 1): Met SLP Short Term Goal 4 (Week 1): Patient will wear PMSV with full supervision with all vitals remaining WFL.  SLP Short Term Goal 4 - Progress (Week 1): Met SLP Short Term Goal 5 (Week 1): Patient will follow 1 step commands with 75% accuracy and Max A multimodal cues.  SLP Short Term Goal 5 - Progress (Week 1): Met    New Short Term Goals: Week 2: SLP Short Term Goal 1 (Week 2): Patient will consume trials of thin liquids with minimal overt s/s of aspiration with Mod A verbal cues for use of slow rate over 2 sessions prior to upgrade.  SLP Short Term Goal 2 (Week 2): Patient will consume trials of upgraded textures over 2 sessions with complete oral clearance and without overt s/s of aspiration over 2 sessions with Max A verbal cues.  SLP Short Term Goal 3 (Week 2): Patient will demonstrate focused attention to a task for 60 seconds with Max A multimodal cues.  SLP Short Term Goal 4 (Week 2): Patient will leave  c-collar in place for 10 minutes with Max A multimodal cues.  SLP Short Term Goal 5 (Week 2): Patient will orient to place and situation with Max A multimodal cues.   Weekly Progress Updates: Patient has made functional but inconsistent gains this reporting period and has met 4 of 5 STG's. Currently, patient continues to demonstrate behaviors consistent with a Rancho Level IV-emerging V and requires overall total A to complete functional and familiar tasks safely in regards to focused attention, problem solving, awareness, recall and overall safety. Patient also continues to demonstrate language of confusion and confabulation. Patient has been decannulated and is currently consuming Dys. 1 textures with nectar-thick liquids with minimal overt s/s of aspiration but requires Max-Total A for use of swallowing compensatory strategies. Patient is also consuming trials of Dys. 2 textures with SLP at this time with complete oral clearance when given small, controlled bites. Patient and family education is ongoing. Patient would benefit from continued skilled SLP intervention to maximize her swallowing and cognitive function and overall functional independence prior to discharge.    Intensity: Minumum of 1-2 x/day, 30 to 90 minutes Frequency: 3 to 5 out of 7 days Duration/Length of Stay: 18-21 days  Treatment/Interventions: Cognitive remediation/compensation;Cueing hierarchy;Functional tasks;Patient/family education;Therapeutic Activities;Internal/external aids;Dysphagia/aspiration precaution training;Environmental controls   Daily Session  Skilled Therapeutic Interventions: Skilled treatment session focused on dysphagia and cognitive goals. SLP facilitated session by adminsitering trials of Dys. 2  textures. Patient demonstrated efficient mastication with complete oral clearance. Recommend trial tray prior to upgrade. Patient also consumed large quantities of nectar-thick liquids without overt s/s of  aspiration and total A for use of portion control. Patient with decreased restlessness today and demonstrated increased ability to participate in tasks. Patient required total A for orientation and problem solving with basic money management task but focused attention to task for ~30-60 second intervals. Patient with continued language of confusion but conext appeared more relevant to situation. Patient transferred back to bed and left supine in bed with restraints in place and bed alarm on. Conitnue with current plan of care.       Function:   Eating Eating   Modified Consistency Diet: Yes Eating Assist Level: Set up assist for;Supervision or verbal cues   Eating Set Up Assist For: Opening containers       Cognition Comprehension Comprehension assist level: Understands basic 50 - 74% of the time/ requires cueing 25 - 49% of the time  Expression   Expression assist level: Expresses basic 50 - 74% of the time/requires cueing 25 - 49% of the time. Needs to repeat parts of sentences.  Social Interaction Social Interaction assist level: Interacts appropriately 25 - 49% of time - Needs frequent redirection.  Problem Solving Problem solving assist level: Solves basic 25 - 49% of the time - needs direction more than half the time to initiate, plan or complete simple activities  Memory Memory assist level: Recognizes or recalls less than 25% of the time/requires cueing greater than 75% of the time   Pain Pain Assessment Pain Score: 4   Therapy/Group: Individual Therapy  Yona Kosek 10/17/2016, 3:26 PM

## 2016-10-17 NOTE — Progress Notes (Signed)
Upon passing patients room, NT noticed patient was sitting at edge of bed, out of her restraints with no shirt on.  PICC line was noted lying on the floor next to her bed.  PICC site not actively bleeding, applied compression dressing.  Unable to reorient patient due to TBI.  PICC tubing noted to be 44 cm long, which was what was documented to be placed.  Notified PA, Marlowe Shores, and IV team for input.  Patient was not currently receiving anything through PICC.  Will continue to monitor site for bleeding.  Brita Romp, RN

## 2016-10-17 NOTE — Progress Notes (Signed)
Occupational Therapy Weekly Progress Note  Patient Details  Name: Jacqueline [name] MRN: [id number] Date of Birth: [birth year]  Beginning of progress report period: October 10, 2016 End of progress report period: October 18, 2016   Patient has met 2 of 5 short term goals. Pt made slow and inconsistent functional gains this past week.  Pt continues to require max/tot A for orientation, task initiation, sequencing, and attention to task.  Pt dons shirt and pants with steady A and max verbal cues for initiation.  Pt continues to remove her cervical collar despite continued education regarding function and safety.  Pt exhibits behaviors consistent with [place], emerging V.  Patient continues to demonstrate the following deficits: muscle weakness, decreased cardiorespiratoy endurance, decreased attention, decreased awareness, decreased problem solving, decreased safety awareness and decreased memory and decreased standing balance, decreased postural control, decreased balance strategies and difficulty maintaining precautions and therefore will continue to benefit from skilled OT intervention to enhance overall performance with BADL and Reduce care partner burden.  Patient progressing toward long term goals..  Continue plan of care.  OT Short Term Goals Week 1:  OT Short Term Goal 1 (Week 1): Pt will demonstrate improved praxis by using deoderant in the correct manner. OT Short Term Goal 1 - Progress (Week 1): Progressing toward goal OT Short Term Goal 2 (Week 1): Pt will demonstrate improved sitting balance EOB with S while engaging in UB self care. OT Short Term Goal 2 - Progress (Week 1): Met OT Short Term Goal 3 (Week 1): Pt will demonstrate improved standing balance of min A while engaging in LB self care. OT Short Term Goal 3 - Progress (Week 1): Met OT Short Term Goal 4 (Week 1): Pt will attend to self care task for at least 3 minutes without redirection.  OT Short Term Goal 4 -  Progress (Week 1): Progressing toward goal OT Short Term Goal 5 (Week 1): Pt will demonstrate improved orientation to identify that she is in a hospital. OT Short Term Goal 5 - Progress (Week 1): Progressing toward goal Week 2:  OT Short Term Goal 1 (Week 2): Pt will demonstrate improved praxis by using deoderant in the correct manner with min verbal cues OT Short Term Goal 2 (Week 2): Pt will attend to self care task for at least 3 minutes without redirection.  OT Short Term Goal 3 (Week 2): Pt will demonstrate improved orientation to identify that she is in a hospital with mod verbal cues     Therapy Documentation Precautions: Precautions Precautions: Cervical, Fall, Other (comment) Precaution Comments: abdominal incision Required Braces or Orthoses: Cervical Brace Cervical Brace: Hard collar, At all times Restrictions Weight Bearing Restrictions: No  See Function Navigator for Current Functional Status.    [name] [facility] 10/18/2016, 6:34 AM

## 2016-10-18 ENCOUNTER — Inpatient Hospital Stay (HOSPITAL_COMMUNITY): Payer: Medicaid Other | Admitting: Speech Pathology

## 2016-10-18 ENCOUNTER — Inpatient Hospital Stay (HOSPITAL_COMMUNITY): Payer: Self-pay | Admitting: Occupational Therapy

## 2016-10-18 ENCOUNTER — Inpatient Hospital Stay (HOSPITAL_COMMUNITY): Payer: Self-pay

## 2016-10-18 ENCOUNTER — Inpatient Hospital Stay (HOSPITAL_COMMUNITY): Payer: Self-pay | Admitting: Physical Therapy

## 2016-10-18 LAB — CBC
HEMATOCRIT: 31.8 % — AB (ref 36.0–46.0)
HEMOGLOBIN: 10.2 g/dL — AB (ref 12.0–15.0)
MCH: 29.5 pg (ref 26.0–34.0)
MCHC: 32.1 g/dL (ref 30.0–36.0)
MCV: 91.9 fL (ref 78.0–100.0)
Platelets: 597 10*3/uL — ABNORMAL HIGH (ref 150–400)
RBC: 3.46 MIL/uL — AB (ref 3.87–5.11)
RDW: 17.2 % — ABNORMAL HIGH (ref 11.5–15.5)
WBC: 16.3 10*3/uL — ABNORMAL HIGH (ref 4.0–10.5)

## 2016-10-18 LAB — COMPREHENSIVE METABOLIC PANEL
ALBUMIN: 2.1 g/dL — AB (ref 3.5–5.0)
ALT: 14 U/L (ref 14–54)
ANION GAP: 12 (ref 5–15)
AST: 19 U/L (ref 15–41)
Alkaline Phosphatase: 155 U/L — ABNORMAL HIGH (ref 38–126)
BUN: 10 mg/dL (ref 6–20)
CO2: 25 mmol/L (ref 22–32)
Calcium: 8.1 mg/dL — ABNORMAL LOW (ref 8.9–10.3)
Chloride: 101 mmol/L (ref 101–111)
Creatinine, Ser: 0.91 mg/dL (ref 0.44–1.00)
GFR calc non Af Amer: >60 mL/min (ref >60)
Glucose, Bld: 93 mg/dL (ref 65–99)
POTASSIUM: 4.6 mmol/L (ref 3.5–5.1)
SODIUM: 138 mmol/L (ref 135–145)
Total Bilirubin: 0.6 mg/dL (ref 0.3–1.2)
Total Protein: 6.5 g/dL (ref 6.5–8.1)

## 2016-10-18 LAB — VALPROIC ACID LEVEL: VALPROIC ACID LVL: 43 ug/mL — AB (ref 50.0–100.0)

## 2016-10-18 MED ORDER — BACITRACIN-NEOMYCIN-POLYMYXIN OINTMENT TUBE
TOPICAL_OINTMENT | Freq: Two times a day (BID) | CUTANEOUS | Status: DC
  Administered 2016-10-18 – 2016-10-21 (×4): via TOPICAL
  Administered 2016-10-21 – 2016-10-22 (×2): 1 via TOPICAL
  Administered 2016-10-22 – 2016-11-03 (×19): via TOPICAL
  Administered 2016-11-03: 1 via TOPICAL
  Administered 2016-11-04 – 2016-11-16 (×23): via TOPICAL
  Administered 2016-11-17: 1 via TOPICAL
  Administered 2016-11-17 – 2016-11-22 (×7): via TOPICAL
  Administered 2016-11-22: 1 via TOPICAL
  Administered 2016-11-23 – 2016-12-01 (×8): via TOPICAL
  Administered 2016-12-02: 1 via TOPICAL
  Administered 2016-12-02 – 2016-12-06 (×7): via TOPICAL
  Administered 2016-12-06: 1 via TOPICAL
  Administered 2016-12-07 – 2016-12-10 (×5): via TOPICAL
  Filled 2016-10-18 (×5): qty 14.17

## 2016-10-18 NOTE — Progress Notes (Signed)
Patient has a large scab on the occipital lobe area of her scalp, it has started to life away and the skin underneath is white and moist. Wound Care has been called for further assessment.

## 2016-10-18 NOTE — Progress Notes (Signed)
Social Work Patient ID: Jacqueline [name], female   DOB: 05-Dec-1965, 51 y.o.   MRN: [id number]   Reviewed team conference with pt's mother yesterday.  She understands that no LOS really targeted yet and plan still for SNF after CIR.  Mother with questions about wound/ medical issues and had Marlowe Sh[place], PA follow up with her.  Spoke with mother again today to see if she is agreeable with staff cutting pt's hair in effort to detangle and clean her scalp - she is agreeable.  Will continue to follow.  Zaniya Mcaulay, LCSW
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
## 2016-10-18 NOTE — Progress Notes (Signed)
Patient family expressed concern with 4 point restraint stated that he felt that it was prohibiting her progress and felt it was inhuman. Enclosure bed was explained as alternative, patient family okay with this option.MD notified okayed order for enclosure bed

## 2016-10-18 NOTE — Progress Notes (Signed)
Speech Language Pathology Daily Session Note  Patient Details  Name: Jacqueline [name] MRN: [id number] Date of Birth: [birth year]  Today's Date: 10/18/2016 SLP Individual Time: 1400-1500 SLP Individual Time Calculation (min): 60 min   Short Term Goals: Week 2: SLP Short Term Goal 1 (Week 2): Patient will consume trials of thin liquids with minimal overt s/s of aspiration with Mod A verbal cues for use of slow rate over 2 sessions prior to upgrade.  SLP Short Term Goal 2 (Week 2): Patient will consume trials of upgraded textures over 2 sessions with complete oral clearance and without overt s/s of aspiration over 2 sessions with Max A verbal cues.  SLP Short Term Goal 3 (Week 2): Patient will demonstrate focused attention to a task for 60 seconds with Max A multimodal cues.  SLP Short Term Goal 4 (Week 2): Patient will leave c-collar in place for 10 minutes with Max A multimodal cues.  SLP Short Term Goal 5 (Week 2): Patient will orient to place and situation with Max A multimodal cues.   Skilled Therapeutic Interventions: Skilled treatment session focused on dysphagia and cognition goals. SLP facilitated session by providing Max A multimodal cues to orient to place and situation. Pt's brother present during portion of session and pt able to intermittently recall relationship and brother's name with Max A multimodal cues. Pt able to demonstrate focused attention to task for ~45 seconds with Max A multimodal redirection to task. Pt consumed trials of graham crackers with complete oral clearing. Pt needed hand-over-hand assist for rate control with nectar thick liquids but no overt s/s of aspiration noted.  Pt was returned to enclosure bed and was left sleeping. Nursing aware of return to room.   Function:  Eating Eating   Modified Consistency Diet: Yes Eating Assist Level: Set up assist for;Supervision or verbal cues   Eating Set Up Assist For: Opening containers Helper Scoops Food on Utensil:  Occasionally Helper Brings Food to Mouth: Occasionally   Cognition Comprehension    Expression   Expression assist level: Expresses basic 50 - 74% of the time/requires cueing 25 - 49% of the time. Needs to repeat parts of sentences.  Social Interaction Social Interaction assist level: Interacts appropriately 25 - 49% of time - Needs frequent redirection.  Problem Solving Problem solving assist level: Solves basic 25 - 49% of the time - needs direction more than half the time to initiate, plan or complete simple activities  Memory Memory assist level: Recognizes or recalls less than 25% of the time/requires cueing greater than 75% of the time    Pain    Therapy/Group: Individual Therapy  Chrys Landgrebe 10/18/2016, 2:56 PM

## 2016-10-18 NOTE — Progress Notes (Signed)
Occupational Therapy Note  Patient Details  Name: Jacqueline [name] MRN: [id number] Date of Birth: [birth year]  Today's Date: 10/18/2016 OT Individual Time: 1030-1100 OT Individual Time Calculation (min): 30 min    Pt denied pain Individual therapy  Focus on orientation, attention to task, task initiation, and sequencing.  Pt required tot A for orientation but unable to hold on to information and required continual reenforcement.  Pt required max multimodal cues to initiate washing face when presented with wash cloth.  Pt attempted to place wash cloth in mouth.  Pt required max multimodal cues to attend to task and complete task.  Pt returned to enclosure bed and secured.   [name] [facility] 10/18/2016, 3:08 PM

## 2016-10-18 NOTE — Consult Note (Signed)
[facility] Nurse wound consult note Reason for Consult: right occipital skin alteration.  WTA from rehab contacted [name] with concerns for a pressure injury on the right occipital area. This is also noted to where she had her original injury. Wound type: trauma  Measurement: see nursing flow sheet Wound bed:100% pink, moist Drainage (amount, consistency, odor) serosanguinous  Periwound: intact  Dressing procedure/placement/frequency: Upon my arrival it was noted that patient has a large area of matted hair over the area of concern.  Under the matted hair I am able to visualize open skin that is pink and moist. Permission per patient's mother per ST in the room to cut her hair.  She has a lot of matted, tangled long hair.   I cut away the matted hair mass and revealed a superficial area that I feel is most likely related to trauma from her initial injury based on the location since she had an traumatic large right occipital epidural hematoma.   Will add neosporin BID with no dressing because it would be impossible to keep a dressing on this site and the patient also has a history of picking at dressings.   Discussed POC bedside nurse.  Re consult if needed, will not follow at this time. Thanks  Marnita Poirier [employer], RN,CWOCN, CNS [phone])

## 2016-10-18 NOTE — Progress Notes (Signed)
Occupational Therapy Session Note  Patient Details  Name: Jacqueline [name] MRN: [id number] Date of Birth: August 15, 1966  Today's Date: 10/18/2016 OT Individual Time: 0800-0900 OT Individual Time Calculation (min): 60 min     Short Term Goals: Week 2:  OT Short Term Goal 1 (Week 2): Pt will demonstrate improved praxis by using deoderant in the correct manner with min verbal cues OT Short Term Goal 2 (Week 2): Pt will attend to self care task for at least 3 minutes without redirection.  OT Short Term Goal 3 (Week 2): Pt will demonstrate improved orientation to identify that she is in a hospital with mod verbal cues  Skilled Therapeutic Interventions/Progress Updates:    Pt sitting up in enclosure bed this morning.  Pt stated that she was thirsty and "needed something to drink now."  Pt engaged in eating breakfast with focus on task initiation, sequencing, attention to task, and following swallowing strategies.  Pt initially sat EOB and then requested to sit in chair.  All itmes (bedside table, medical supplies, etc.) were placed out of pt's reach because pt continually picked up any item she could find and attempted to place in mouth.  Pt initially attempted to drink her yogurt and required max verbal cues to redirect to appropriate method of eating.  Pt consumed 28 oz fluids during breakfast.  Pt's conversation was very tangential this morning.  Pt required tot A for orientation to place or time of day (day or night). Pt, at one time, said there was a cat in her room.  Pt required max verbal cues to redirect to different topic.  Pt returned to enclosure bed and bed secured.   Therapy Documentation Precautions:  Precautions Precautions: Cervical, Fall, Other (comment) Precaution Comments: abdominal incision Required Braces or Orthoses: Cervical Brace Cervical Brace: Hard collar, At all times Restrictions Weight Bearing Restrictions: No Pain:  Pt with no c/o of pain this morning.  See  Function Navigator for Current Functional Status.   Therapy/Group: Individual Therapy  Leroy Libman 10/18/2016, 9:01 AM

## 2016-10-18 NOTE — Progress Notes (Signed)
Occupational Therapy Note  Patient Details  Name: Jacqueline [name] MRN: [id number] Date of Birth: 11-26-1965  Today's Date: 10/18/2016 OT Individual Time: 1300-1400 OT Individual Time Calculation (min): 60 min    Pt c/o about abdominal pain- bandage leaking and needs to be changed- RN notified.  1:1 Pt participate in cognitive table top activities for 30 min with mod cues to attend to complete tasks. Pt participated in answering questions about wayfinding on a map, filling out questions related to orientation, and sequencing tasks. Pt unable to complete the sequencing task due to confusion and decr attention to task. Pt was able to answer correctly 10 questions with min A on way finding map. Pt only oriented to herself.   Returned back to room for oral care with suction toothbrush with total A for swallowing precautions. SW assisted in calling mother for permission on cutting some of pt's hair for management of knots for cleanliness; permission granted. Found large scab with wound underneath when caring for her hair. RN and PA made aware and left pt with them to care for wound with pt in bed.   Willeen Cass [facility] 10/18/2016, 3:57 PM

## 2016-10-18 NOTE — Progress Notes (Signed)
Physical Therapy Note  Patient Details  Name: SHANIQUAH SAYARATH MRN: [id number] Date of Birth: 1966/04/18 Today's Date: 10/18/2016    Time: [phone] 40 minutes  1:1 No c/o pain.  Pt asking to go to restroom to start PT. Pt gait with min A to restroom with ataxic gait, no balance reactions noted. Pt able to toilet continently during session.  Gait throughout unit with min A for increased ataxia, decreased attention and awareness. Pt asks for a drink and a snack. Pt able to drink and eat with cues for small bites/sips, max cues to wipe face and clean table after eating.  Nu step for improving attention x 5 minutes level 3 with pt able to pedal 30 seconds at a time before needing cues to continue task.  Pt complaining throughout session of fatigue, requiring max cues to participate. Pt requires total A for orientation to place, time and situation.   Jaymarion Trombly 10/18/2016, 10:17 AM

## 2016-10-19 ENCOUNTER — Inpatient Hospital Stay (HOSPITAL_COMMUNITY): Payer: Self-pay

## 2016-10-19 ENCOUNTER — Inpatient Hospital Stay (HOSPITAL_COMMUNITY): Payer: Self-pay | Admitting: Physical Therapy

## 2016-10-19 ENCOUNTER — Inpatient Hospital Stay (HOSPITAL_COMMUNITY): Payer: Self-pay | Admitting: Occupational Therapy

## 2016-10-19 ENCOUNTER — Inpatient Hospital Stay (HOSPITAL_COMMUNITY): Payer: Medicaid Other | Admitting: Speech Pathology

## 2016-10-19 MED ORDER — AMOXICILLIN-POT CLAVULANATE 400-57 MG/5ML PO SUSR
800.0000 mg | Freq: Two times a day (BID) | ORAL | Status: AC
  Administered 2016-10-19 – 2016-10-24 (×10): 800 mg via ORAL
  Filled 2016-10-19 (×11): qty 10

## 2016-10-19 NOTE — Progress Notes (Signed)
Occupational Therapy Session Note  Patient Details  Name: OCEAN VANDEVANDER MRN: [id number] Date of Birth: [birth year]  Today's Date: 10/19/2016 OT Individual Time: 1330-1430 OT Individual Time Calculation (min): 60 min     Short Term Goals: Week 2:  OT Short Term Goal 1 (Week 2): Pt will demonstrate improved praxis by using deoderant in the correct manner with min verbal cues OT Short Term Goal 2 (Week 2): Pt will attend to self care task for at least 3 minutes without redirection.  OT Short Term Goal 3 (Week 2): Pt will demonstrate improved orientation to identify that she is in a hospital with mod verbal cues  Skilled Therapeutic Interventions/Progress Updates:   1:1 Pt participated in self care retraining at shower level. Pt's abdominal wound covered and bathed sitting on shower seat with mod cues to wash 10/10 parts. Pt internally distracted by wanting a beer and a cigarette . Pt could be easily redirected to current task but need constant redirection to maintain sustained attention on task. Pt able to pick out clothing with out VC and dressed in standing position with close supervision with wall being her support as needed. Total A to attempt to brush and comb out hair. Pt still with very large knot in back of head which still needs work. Pt requested to lay down; making for an easy transition into the enclosure bed but still yelling for a beer/ cigarette.  Therapy Documentation Precautions:  Precautions Precautions: Cervical, Fall, Other (comment) Precaution Comments: abdominal incision Required Braces or Orthoses: Cervical Brace Cervical Brace: Hard collar, At all times Restrictions Weight Bearing Restrictions: No  Pain:  no c/o in session   See Function Navigator for Current Functional Status.   Therapy/Group: Individual Therapy  Willeen Cass [facility] 10/19/2016, 3:11 PM

## 2016-10-19 NOTE — Progress Notes (Signed)
Patient ID: Jacqueline [name], female   DOB: 13-Jun-1966, 51 y.o.   MRN: [id number]   LOS: 10 days   Subjective: No new c/o. CIR PA notes increase in greenish, malodorous discharge.   Objective: Vital signs in last 24 hours: Temp:  [97.4 F (36.3 C)-98 F (36.7 C)] 98 F (36.7 C) (01/12 0457) Pulse Rate:  [108-112] 108 (01/12 0457) Resp:  [16-18] 16 (01/12 0457) BP: (92-104)/(46-76) 104/76 (01/12 0457) SpO2:  [99 %] 99 % (01/12 0457) Weight:  [84.8 kg (186 lb 15.2 oz)] 84.8 kg (186 lb 15.2 oz) (01/12 0457) Last BM Date: 10/17/16   Physical Exam      Assessment/Plan: S/p ex lap-- Wound appears clean but some malodorous discharge on dressing. Culture taken, will start empirically on Augmentin and await culture results. We will plan to see her again on Monday but call over weekend with any questions.    Lisette Abu, PA-C Pager: [phone] General Trauma PA Pager: [phone]  10/19/2016

## 2016-10-19 NOTE — Progress Notes (Signed)
Occupational Therapy Note  Patient Details  Name: Jacqueline [name] MRN: [id number] Date of Birth: 03-Nov-1965  Today's Date: 10/19/2016 OT Individual Time: 1030-1100 OT Individual Time Calculation (min): 30 min    Pt c/o "not feeling well"; RN aware Individual Therapy  Pt resting in enclosure bed upon arrival with mother and brother present.  Pt engaged in functional amb to ADL apartment.  Pt perseverated on having a beer and a cigarette and became augumentative when it was explained that she couldn't have one because she was in hospital.  Pt required tot A for orientation to place, situation, and year.  Pt required max verbal cues to recognize her brother. Pt continues to request liquids persistently throughout session.  Pt returned to enclosure bed with family present and bed secured.   [name] [facility] 10/19/2016, 11:34 AM

## 2016-10-19 NOTE — Progress Notes (Signed)
Speech Language Pathology Daily Session Note  Patient Details  Name: Jacqueline [name] MRN: [id number] Date of Birth: 1966/09/27  Today's Date: 10/19/2016 Treatment Session: #1 SLP Individual Time: [id number] SLP Individual Time Calculation (min): 45 min  Treatment Session: #2 SLP Individual Time: [id number] SLP Individual Time Calculation (min): 20 min   Short Term Goals: Week 2: SLP Short Term Goal 1 (Week 2): Patient will consume trials of thin liquids with minimal overt s/s of aspiration with Mod A verbal cues for use of slow rate over 2 sessions prior to upgrade.  SLP Short Term Goal 2 (Week 2): Patient will consume trials of upgraded textures over 2 sessions with complete oral clearance and without overt s/s of aspiration over 2 sessions with Max A verbal cues.  SLP Short Term Goal 3 (Week 2): Patient will demonstrate focused attention to a task for 60 seconds with Max A multimodal cues.  SLP Short Term Goal 4 (Week 2): Patient will leave c-collar in place for 10 minutes with Max A multimodal cues.  SLP Short Term Goal 5 (Week 2): Patient will orient to place and situation with Max A multimodal cues.   Skilled Therapeutic Interventions:    Treatment session #1: Skilled treatment session focused on cognition goals. SLP facilitated session by providing Mod A for sustained attention for ~1 minute intervals. Pt able to follow simple 1 step directions with color sequencing pegs and then number sequencing tasks with Mod A verbal cues. Pt maintained alertness for 45 minutes without cues for arousal. Language of confusion persists with difficulty retaining orientation information with Max a multimodal cues. Pt began to perseverate on smoking and toileting with Max multimodal redirection not helpful. Pt taken back to room and left in nursing care.   Treatment session #2: Skilled treatment focused on dysphagia goals. SLP facilitated session by presenting pt with dysphagia 2 lunch tray. Pt in  enclosure bed when SLP arrived to room and demonstrated some frustration with being in bed. SLP assisted pt to wheelchair and presented lunch. Pt with language of confusion and unable to be redirected to lunch. With Max multimodal encouragement, pt attempted to consume several bites and continued to talk with food in her mouth. Pt spit out all trials d/t cognitive perseveration. Continue with dysphagia 1 until more tray trials of dysphagia 2 can be assessed. Pt left in nursing care. Continue per nursing plan of care.   Function:  Eating Eating   Modified Consistency Diet: Yes Eating Assist Level: Set up assist for;Supervision or verbal cues   Eating Set Up Assist For: Opening containers Helper Scoops Food on Utensil: Occasionally Helper Brings Food to Mouth: Occasionally   Cognition Comprehension Comprehension assist level: Understands basic 50 - 74% of the time/ requires cueing 25 - 49% of the time  Expression   Expression assist level: Expresses basic 50 - 74% of the time/requires cueing 25 - 49% of the time. Needs to repeat parts of sentences.  Social Interaction Social Interaction assist level: Interacts appropriately 25 - 49% of time - Needs frequent redirection.  Problem Solving Problem solving assist level: Solves basic 25 - 49% of the time - needs direction more than half the time to initiate, plan or complete simple activities  Memory Memory assist level: Recognizes or recalls less than 25% of the time/requires cueing greater than 75% of the time    Pain    Therapy/Group: Individual Therapy  Nycere Presley 10/19/2016, 4:10 PM
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
## 2016-10-19 NOTE — Plan of Care (Signed)
Problem: RH SKIN INTEGRITY Goal: RH STG SKIN FREE OF INFECTION/BREAKDOWN Skin free of infection/breakdown with mod assistance  Outcome: Not Progressing Total assistance

## 2016-10-19 NOTE — Progress Notes (Signed)
Occupational Therapy Session Note  Patient Details  Name: NATURE MILLET MRN: [id number] Date of Birth: Jul 13, 1966  Today's Date: 10/19/2016 OT Individual Time: [id number] OT Individual Time Calculation (min): 55 min     Short Term Goals: Week 2:  OT Short Term Goal 1 (Week 2): Pt will demonstrate improved praxis by using deoderant in the correct manner with min verbal cues OT Short Term Goal 2 (Week 2): Pt will attend to self care task for at least 3 minutes without redirection.  OT Short Term Goal 3 (Week 2): Pt will demonstrate improved orientation to identify that she is in a hospital with mod verbal cues  Skilled Therapeutic Interventions/Progress Updates:    Pt on toilet with NT present upon arrival.  Pt completed toilet hygiene tasks and pulled up pants with steady A provided.  Pt amb with HHA to chair in room to eat breakfast.  Pt required max verbal cues for task initiation, sequencing, attention to task, and safety awareness throughout meal.  Pt persistently requested fluids throughout meal and often mixed her food with fluids.  Pt's conversation was extremely tangential throughout session and required max verbal cues to redirect to task or topic.  Pt returned to enclosure bed and secured.  Therapy Documentation Precautions:  Precautions Precautions: Cervical, Fall, Other (comment) Precaution Comments: abdominal incision Required Braces or Orthoses: Cervical Brace Cervical Brace: Hard collar, At all times Restrictions Weight Bearing Restrictions: No Pain:  Pt denied pain   See Function Navigator for Current Functional Status.   Therapy/Group: Individual Therapy  Leroy Libman 10/19/2016, 8:59 AM

## 2016-10-19 NOTE — Progress Notes (Signed)
Physical Therapy Note  Patient Details  Name: Jacqueline [name] MRN: [id number] Date of Birth: October 08, 1966 Today's Date: 10/19/2016    Time: [phone] 53 minutes  1:1 No c/o pain. Pt c/o fatigue throughout session.  Pt performed gait throughout unit with min A, continues with ataxia and decreased attention and awareness of obstacles.  Pt toileted x 2 during session with min A for transfers, cuing throughout for safety.  Seated and standing balance with ball toss with mod A for standing balance. Nu step x 5 minutes with max cuing for encouragement and attention to task.  Card game with pt able to correctly identify higher number card 50% of time with max cuing for attention.  Pt perseverating on thirst throughout session, offered 24 ounces of fluid and continues to c/o thirst. Pt continues to be limited by impaired attention and awareness limiting progress with functional tasks.   Jullisa Grigoryan 10/19/2016, 10:25 AM

## 2016-10-20 DIAGNOSIS — T814XXD Infection following a procedure, subsequent encounter: Secondary | ICD-10-CM

## 2016-10-20 DIAGNOSIS — T8149XA Infection following a procedure, other surgical site, initial encounter: Secondary | ICD-10-CM | POA: Insufficient documentation

## 2016-10-20 DIAGNOSIS — G479 Sleep disorder, unspecified: Secondary | ICD-10-CM
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
## 2016-10-21 ENCOUNTER — Inpatient Hospital Stay (HOSPITAL_COMMUNITY): Payer: Self-pay | Admitting: Physical Therapy

## 2016-10-21 DIAGNOSIS — D72829 Elevated white blood cell count, unspecified: Secondary | ICD-10-CM

## 2016-10-21 DIAGNOSIS — D62 Acute posthemorrhagic anemia: Secondary | ICD-10-CM

## 2016-10-21 LAB — AEROBIC CULTURE  (SUPERFICIAL SPECIMEN)

## 2016-10-21 LAB — AEROBIC CULTURE W GRAM STAIN (SUPERFICIAL SPECIMEN)

## 2016-10-21 NOTE — Progress Notes (Signed)
Physical Therapy Note  Patient Details  Name: Jacqueline [name] MRN: [id number] Date of Birth: 1966-03-06 Today's Date: 10/21/2016    Time: 1015-1100 45 minutes  1:1 No c/o pain. Session focused on sustained attention to various tasks. Pt toileted x 2 during session with cues for safety and mod cues for attention to task due to perseveration on thirst.  Pt performed gait throughout unit with min A, decreased awareness of obstacles, unable to attend to directions for pathfinding.  Pt performed nu step x 5 minutes with only 3x cues for continuing task, improved from last session.  Pt participated in checkers game x 5-6 minutes with mod cuing for attention to task.  Pt making slow gains in attention.   [name] 10/21/2016, 12:48 PM
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
## 2016-10-21 NOTE — Progress Notes (Signed)
Disoriented to place, time, situation.  Follows commands most of the time, but is impulsive.  Constantly requesting something to drink and cigarettes.  Sometimes refuses to cooperate until she gets a cigarette.  Frequently reminded there is no smoking here.  Feeds herself, but dumps all food together and stirs it up.  Distracted by additional items on her tray such a wet-wipe and condiments.  Pours all liquids together and drinks them.  Grabbing items she can reach with the intent of consuming them.  Pulls dressing off abdomen and neck approximately once or twice a shift.  Calling out for family members when awake.

## 2016-10-22 ENCOUNTER — Inpatient Hospital Stay (HOSPITAL_COMMUNITY): Payer: Self-pay

## 2016-10-22 ENCOUNTER — Inpatient Hospital Stay (HOSPITAL_COMMUNITY): Payer: Medicaid Other | Admitting: Speech Pathology

## 2016-10-22 ENCOUNTER — Inpatient Hospital Stay (HOSPITAL_COMMUNITY): Payer: Medicaid Other | Admitting: Physical Therapy

## 2016-10-22 ENCOUNTER — Inpatient Hospital Stay (HOSPITAL_COMMUNITY): Payer: Self-pay | Admitting: Physical Therapy

## 2016-10-22 DIAGNOSIS — T814XXS Infection following a procedure, sequela: Secondary | ICD-10-CM

## 2016-10-22 LAB — CBC WITH DIFFERENTIAL/PLATELET
BASOS PCT: 0 %
Basophils Absolute: 0.1 10*3/uL (ref 0.0–0.1)
EOS ABS: 0.2 10*3/uL (ref 0.0–0.7)
EOS PCT: 1 %
HCT: 32.1 % — ABNORMAL LOW (ref 36.0–46.0)
Hemoglobin: 9.9 g/dL — ABNORMAL LOW (ref 12.0–15.0)
Lymphocytes Relative: 16 %
Lymphs Abs: 2 10*3/uL (ref 0.7–4.0)
MCH: 29 pg (ref 26.0–34.0)
MCHC: 30.8 g/dL (ref 30.0–36.0)
MCV: 94.1 fL (ref 78.0–100.0)
Monocytes Absolute: 1.8 10*3/uL — ABNORMAL HIGH (ref 0.1–1.0)
Monocytes Relative: 14 %
NEUTROS PCT: 69 %
Neutro Abs: 8.7 10*3/uL — ABNORMAL HIGH (ref 1.7–7.7)
PLATELETS: 667 10*3/uL — AB (ref 150–400)
RBC: 3.41 MIL/uL — AB (ref 3.87–5.11)
RDW: 17.5 % — ABNORMAL HIGH (ref 11.5–15.5)
WBC: 12.8 10*3/uL — ABNORMAL HIGH (ref 4.0–10.5)

## 2016-10-22 LAB — BASIC METABOLIC PANEL
Anion gap: 11 (ref 5–15)
BUN: 7 mg/dL (ref 6–20)
CHLORIDE: 105 mmol/L (ref 101–111)
CO2: 26 mmol/L (ref 22–32)
CREATININE: 0.82 mg/dL (ref 0.44–1.00)
Calcium: 8.5 mg/dL — ABNORMAL LOW (ref 8.9–10.3)
Glucose, Bld: 93 mg/dL (ref 65–99)
POTASSIUM: 4 mmol/L (ref 3.5–5.1)
Sodium: 142 mmol/L (ref 135–145)

## 2016-10-22 MED ORDER — BROMOCRIPTINE MESYLATE 2.5 MG PO TABS
2.5000 mg | ORAL_TABLET | Freq: Two times a day (BID) | ORAL | Status: DC
  Administered 2016-10-22 – 2016-10-27 (×11): 2.5 mg via ORAL
  Filled 2016-10-22 (×13): qty 1

## 2016-10-22 MED ORDER — BUPROPION HCL 100 MG PO TABS
100.0000 mg | ORAL_TABLET | Freq: Two times a day (BID) | ORAL | Status: DC
  Administered 2016-10-22 – 2016-10-24 (×4): 100 mg via ORAL
  Filled 2016-10-22 (×4): qty 1

## 2016-10-22 NOTE — Progress Notes (Signed)
Speech Language Pathology Daily Session Note  Patient Details  Name: Jacqueline [name] MRN: [id number] Date of Birth: 09-Nov-1965  Today's Date: 10/22/2016 SLP Individual Time: 0930-1030 SLP Individual Time Calculation (min): 60 min   Short Term Goals: Week 2: SLP Short Term Goal 1 (Week 2): Patient will consume trials of thin liquids with minimal overt s/s of aspiration with Mod A verbal cues for use of slow rate over 2 sessions prior to upgrade.  SLP Short Term Goal 2 (Week 2): Patient will consume trials of upgraded textures over 2 sessions with complete oral clearance and without overt s/s of aspiration over 2 sessions with Max A verbal cues.  SLP Short Term Goal 3 (Week 2): Patient will demonstrate focused attention to a task for 60 seconds with Max A multimodal cues.  SLP Short Term Goal 4 (Week 2): Patient will leave c-collar in place for 10 minutes with Max A multimodal cues.  SLP Short Term Goal 5 (Week 2): Patient will orient to place and situation with Max A multimodal cues.   Skilled Therapeutic Interventions: Skilled treatment session focused on cognitive and dysphagia goals. SLP facilitated session by providing total A for orientation to place and time, however, patient was independently oriented to situation. Patient independently requested to use the bathroom X 3 throughout session and was continent of bowel X 3, RN made aware. Patient required Max A verbal cues for problem solving and focused attention to self-care tasks for ~30 seconds. Patient performed oral care via the suction toothbrush with set-up assist and consumed trials of ice chips and water without overt s/s of aspiration. Recommend continued trials with SLP only X 1 more session prior to upgrade. Patient with frequent language of confusion throughout session. Patient left upright in [place] chair with mom present. Continue with current plan of care.   Function:  Eating Eating   Modified Consistency Diet: Yes Eating  Assist Level: Supervision or verbal cues;Help managing cup/glass   Eating Set Up Assist For: Opening containers       Cognition Comprehension Comprehension assist level: Understands basic 25 - 49% of the time/ requires cueing 50 - 75% of the time  Expression      Social Interaction Social Interaction assist level: Interacts appropriately 25 - 49% of time - Needs frequent redirection.  Problem Solving Problem solving assist level: Solves basic less than 25% of the time - needs direction nearly all the time or does not effectively solve problems and may need a restraint for safety  Memory Memory assist level: Recognizes or recalls less than 25% of the time/requires cueing greater than 75% of the time    Pain Stomach Pain, RN aware and patient premedicated   Therapy/Group: Individual Therapy  Shadee Rathod, [place] 10/22/2016, 3:13 PM

## 2016-10-22 NOTE — Progress Notes (Signed)
Physical Therapy Session Note  Patient Details  Name: Jacqueline [name] MRN: [id number] Date of Birth: Jan 10, 1966  Today's Date: 10/22/2016 PT Individual Time: 1500-1530 PT Individual Time Calculation (min): 30 min    Short Term Goals: Week 2:  PT Short Term Goal 1 (Week 2): Pt will negotiate 12 steps with B rails & min assist.  PT Short Term Goal 2 (Week 2): Pt will be oriented to place & situation with max multimodal cuing. PT Short Term Goal 3 (Week 2): Pt will focus attention to functioanl task x 60 seconds with max multimodal cuing. PT Short Term Goal 4 (Week 2): Pt will ambulate with supervision.   Skilled Therapeutic Interventions/Progress Updates:   Pt received supine in [place] bed, denies pain and agreeable to treatment with encouragement. Pt perseverative on getting a drink throughout session, requires continuous redirection. Pt used restroom x2 during session with loose bowel movement; therapist performed hygiene for sanitary purposes d/t pt's disorientation and poor balance. Gait throughout unit with min guard/minA overall with staggering and frequently running into objects on R side despite cueing. Engaged in nustep x3 min as instructed with cues to maintain attention to task. Remained seated in recliner at end of session, at nurses station for supervision.   Therapy Documentation Precautions:  Precautions Precautions: Cervical, Fall, Other (comment) Precaution Comments: abdominal incision Required Braces or Orthoses: Cervical Brace Cervical Brace: Hard collar, At all times Restrictions Weight Bearing Restrictions: No   See Function Navigator for Current Functional Status.   Therapy/Group: Individual Therapy  Luberta Mutter 10/22/2016, 3:32 PM

## 2016-10-22 NOTE — Progress Notes (Signed)
Occupational Therapy Session Note  Patient Details  Name: Jacqueline [name] MRN: [id number] Date of Birth: 1966-06-12  Today's Date: 10/22/2016 OT Individual Time: 1100-1130 OT Individual Time Calculation (min): 30 min     Short Term Goals: Week 2:  OT Short Term Goal 1 (Week 2): Pt will demonstrate improved praxis by using deoderant in the correct manner with min verbal cues OT Short Term Goal 2 (Week 2): Pt will attend to self care task for at least 3 minutes without redirection.  OT Short Term Goal 3 (Week 2): Pt will demonstrate improved orientation to identify that she is in a hospital with mod verbal cues  Skilled Therapeutic Interventions/Progress Updates:    Pt resting in gerichair upon arrival with family present.  Pt not oriented to place or situation.  Pt required max verbal cues to recognize her brother.  Pt perseverated on having a cigarette and a beer and would not accept that these items were not permitted in hospital.  Pt stated she needed to use bathroom.  Pt completed toileting tasks with steady A and amb in room to enclosure bed.  Pt very lethargic and stated that her stomach hurt.  NT reported that pt had 6 bowel movements during the morning.  Pt returned to enclosure bed and secured.  Mom remained in room to visit.  Therapy Documentation Precautions:  Precautions Precautions: Cervical, Fall, Other (comment) Precaution Comments: abdominal incision Required Braces or Orthoses: Cervical Brace Cervical Brace: Hard collar, At all times Restrictions Weight Bearing Restrictions: No General: General OT Amount of Missed Time: 30 Minutes  See Function Navigator for Current Functional Status.   Therapy/Group: Individual Therapy  Leroy Libman 10/22/2016, 12:14 PM

## 2016-10-22 NOTE — Progress Notes (Signed)
Patient ID: Jacqueline [name], female   DOB: 03-14-1966, 51 y.o.   MRN: [id number]   LOS: 13 days   Subjective: NSC   Objective: Vital signs in last 24 hours: Temp:  [97.6 F (36.4 C)-98 F (36.7 C)] 97.6 F (36.4 C) (01/15 0409) Pulse Rate:  [100-105] 105 (01/15 0409) Resp:  [19-20] 19 (01/15 0409) BP: (111-112)/(64-74) 112/74 (01/15 0409) SpO2:  [100 %] 100 % (01/15 0409) Last BM Date: 10/21/16   Laboratory  CBC  Recent Labs  10/22/16 0513  WBC 12.8*  HGB 9.9*  HCT 32.1*  PLT 667*   BMET  Recent Labs  10/22/16 0513  NA 142  K 4.0  CL 105  CO2 26  GLUCOSE 93  BUN 7  CREATININE 0.82  CALCIUM 8.5*    Physical Exam Wound appearance WNL with excellent granulation and no dehiscence. Mild amount of fibrinous discharge on dressing, no odor.   Assessment/Plan: S/p ex lap-- Culture showed multiple organisms, none predominant. Recommend continuing abx for 5 days total then stop. Continue wet-to-dry dressing changes bid, could consider VAC as well. Trauma will sign off, please call with questions.    Lisette Abu, PA-C Pager: [phone] General Trauma PA Pager: [phone]  10/22/2016

## 2016-10-22 NOTE — Progress Notes (Signed)
Occupational Therapy Session Note  Patient Details  Name: MIYANAH CISCO MRN: [id number] Date of Birth: 1966-01-28  Today's Date: 10/22/2016 OT Individual Time: 0800-0900 OT Individual Time Calculation (min): 60 min     Short Term Goals: Week 2:  OT Short Term Goal 1 (Week 2): Pt will demonstrate improved praxis by using deoderant in the correct manner with min verbal cues OT Short Term Goal 2 (Week 2): Pt will attend to self care task for at least 3 minutes without redirection.  OT Short Term Goal 3 (Week 2): Pt will demonstrate improved orientation to identify that she is in a hospital with mod verbal cues  Skilled Therapeutic Interventions/Progress Updates:    Pt sitting up in enclosure bed upon arrival.  Pt initially engaged in eating breakfast with focus on task initiation, sequencing, attention to task, and safety awareness.  Pt required max multimodal cues for attention to task.  Pt not oriented to place, date, or situation.  Pt unable to correctly state her age but could correctly recall her birth date.  Pt requested to use toilet and amb with HHA to bathroom for toileting tasks.  Pt returned to enclosure bed and secured.  Therapy Documentation Precautions:  Precautions Precautions: Cervical, Fall, Other (comment) Precaution Comments: abdominal incision Required Braces or Orthoses: Cervical Brace Cervical Brace: Hard collar, At all times Restrictions Weight Bearing Restrictions: No Pain:  Pt denied pain  See Function Navigator for Current Functional Status.   Therapy/Group: Individual Therapy  Leroy Libman 10/22/2016, 9:06 AM

## 2016-10-22 NOTE — Progress Notes (Addendum)
Physical Therapy Session Note  Patient Details  Name: Jacqueline [name] MRN: [id number] Date of Birth: [birth year]  Today's Date: 10/22/2016 PT Individual Time: [id number] PT Individual Time Calculation (min): 55 min    Short Term Goals: Week 2:  PT Short Term Goal 1 (Week 2): Pt will negotiate 12 steps with B rails & min assist.  PT Short Term Goal 2 (Week 2): Pt will be oriented to place & situation with max multimodal cuing. PT Short Term Goal 3 (Week 2): Pt will focus attention to functioanl task x 60 seconds with max multimodal cuing. PT Short Term Goal 4 (Week 2): Pt will ambulate with supervision.   Skilled Therapeutic Interventions/Progress Updates:    Pt received in geri chair at nurses station. Pt without behaviors demonstrating pain but pt reported "I don't feel good" and "I feel sick" throughout session & RN made aware. Session focused on dynamic balance/gait and cognitive remediation. Oriented pt to situation and location throughout session with pt occasionally able to verbalize that she is at "[facility], [place]" or in [place] after being educated. Pt ambulated throughout unit without AD and min assist due to uncoordinated gait pattern. Pt used toilet in room with continent BM. Attempted to engage pt in game of checkers but pt unable to sustain attention >30 seconds. Pt frequently reaching for objects in room & hallway to drink. Provided pt with thickened liquids during session & pt required max assist to consume small sips. At end of session pt left in enclosure bed with all needs within reach.   PA Linna Hoff) verbally cleared pt to participate in session without c-collar donned.    Therapy Documentation Precautions:  Precautions Precautions: Cervical, Fall, Other (comment) Precaution Comments: abdominal incision Required Braces or Orthoses: Cervical Brace Cervical Brace: Hard collar, At all times Restrictions Weight Bearing Restrictions: No   See Function Navigator for  Current Functional Status.   Therapy/Group: Individual Therapy  [name] 10/22/2016, 2:38 PM
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
## 2016-10-23 ENCOUNTER — Inpatient Hospital Stay (HOSPITAL_COMMUNITY): Payer: Self-pay

## 2016-10-23 ENCOUNTER — Inpatient Hospital Stay (HOSPITAL_COMMUNITY): Payer: Self-pay | Admitting: Physical Therapy

## 2016-10-23 ENCOUNTER — Inpatient Hospital Stay (HOSPITAL_COMMUNITY): Payer: Medicaid Other | Admitting: Occupational Therapy

## 2016-10-23 ENCOUNTER — Inpatient Hospital Stay (HOSPITAL_COMMUNITY): Payer: Medicaid Other | Admitting: Physical Therapy

## 2016-10-23 ENCOUNTER — Inpatient Hospital Stay (HOSPITAL_COMMUNITY): Payer: Medicaid Other | Admitting: Speech Pathology

## 2016-10-23 NOTE — Progress Notes (Signed)
Speech Language Pathology Daily Session Note  Patient Details  Name: Jacqueline [name] MRN: [id number] Date of Birth: [birth year]  Today's Date: 10/23/2016 SLP Individual Time: 1030-1110 SLP Individual Time Calculation (min): 40 min   Short Term Goals: Week 2: SLP Short Term Goal 1 (Week 2): Patient will consume trials of thin liquids with minimal overt s/s of aspiration with Mod A verbal cues for use of slow rate over 2 sessions prior to upgrade.  SLP Short Term Goal 2 (Week 2): Patient will consume trials of upgraded textures over 2 sessions with complete oral clearance and without overt s/s of aspiration over 2 sessions with Max A verbal cues.  SLP Short Term Goal 3 (Week 2): Patient will demonstrate focused attention to a task for 60 seconds with Max A multimodal cues.  SLP Short Term Goal 4 (Week 2): Patient will leave c-collar in place for 10 minutes with Max A multimodal cues.  SLP Short Term Goal 5 (Week 2): Patient will orient to place and situation with Max A multimodal cues.   Skilled Therapeutic Interventions: Skilled treatment session focused on cognitive and dysphagia goals. Upon arrival, patient was awake while sitting upright in enclosure bed. Patient ambulated to the bathroom and was continent of bowel and required Mod A verbal cues for problem solving with task. Patient performed oral care via the suction toothbrush with set-up assist. Patient consumed large spoonfuls of ice chips without overt s/s of aspiration but required Max A verbal cues for portion control and anterior spillage from oral cavity. Patient also consumed trials of Dys. 2 textures and demonstrated efficient mastication without overt s/s of aspiration. Recommend trial tray prior to upgrade. Patient continues to demonstrate language of confusion and requires total A for orientation and awareness of deficits but demonstrated increased focused attention to task and conversation for ~4 turns. Patient transferred back to  enclosure bed at end of session. Continue with current plan of care.   Function:  Cognition Comprehension Comprehension assist level: Understands basic 25 - 49% of the time/ requires cueing 50 - 75% of the time  Expression   Expression assist level: Expresses basic needs/ideas: With extra time/assistive device  Social Interaction Social Interaction assist level: Interacts appropriately 25 - 49% of time - Needs frequent redirection.  Problem Solving Problem solving assist level: Solves basic less than 25% of the time - needs direction nearly all the time or does not effectively solve problems and may need a restraint for safety  Memory Memory assist level: Recognizes or recalls less than 25% of the time/requires cueing greater than 75% of the time    Pain No/Denies Pain   Therapy/Group: Individual Therapy  Areana Kosanke 10/23/2016, 2:08 PM

## 2016-10-23 NOTE — Progress Notes (Signed)
Occupational Therapy Session Note  Patient Details  Name: Jacqueline [name] MRN: [id number] Date of Birth: 06-24-1966  Today's Date: 10/23/2016 OT Individual Time: 1418-1500 OT Individual Time Calculation (min): 42 min     Short Term Goals: Week 2:  OT Short Term Goal 1 (Week 2): Pt will demonstrate improved praxis by using deoderant in the correct manner with min verbal cues OT Short Term Goal 2 (Week 2): Pt will attend to self care task for at least 3 minutes without redirection.  OT Short Term Goal 3 (Week 2): Pt will demonstrate improved orientation to identify that she is in a hospital with mod verbal cues  Skilled Therapeutic Interventions/Progress Updates:    Treatment session with focus on attention to task, following one and two step commands, and sequencing.  Pt received up at RN station.  Engaged in [place] task in [place] with focus on sorting and stacking coins.  Pt attended to task 5-7 mins with only one distraction to adjust in gerichair with pt able to return to task without cues.  While sorting coins into stacks of 10, pt asking for a drink.  Completed 2 more stacks and then provided drink.  Exhibited language of confusion intermittently during session with max cues to redirect.  Engaged in [place] and attention task with map to locate various items and provide directions from one destination to another.  Pt expressed enjoyment of activity.  At end of session pt reports need to toilet.  Completed toilet transfer with min guard, pt able to complete clothing management and left upright on toilet with RN present.  Therapy Documentation Precautions:  Precautions Precautions: Cervical, Fall, Other (comment) Precaution Comments: abdominal incision Required Braces or Orthoses: Cervical Brace Cervical Brace: Hard collar, At all times Restrictions Weight Bearing Restrictions: No Pain:  Pt with no c/o pain  See Function Navigator for Current Functional  Status.   Therapy/Group: Individual Therapy  Simonne Come 10/23/2016, 3:36 PM

## 2016-10-23 NOTE — Progress Notes (Signed)
Patient A/self. Patient tolerated meds well. Patient slept well throughout the night. Patient was easy to redirect. Patient remains in [place] bed. Staff will continue to monitor, maintain safety, and meet needs.

## 2016-10-23 NOTE — Plan of Care (Signed)
Problem: RH SAFETY Goal: RH STG ADHERE TO SAFETY PRECAUTIONS W/ASSISTANCE/DEVICE STG Adhere to Safety Precautions With mod  Assistance/Device.  Outcome: Not Progressing Remain impulsive, poor attention
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
## 2016-10-23 NOTE — Progress Notes (Signed)
Physical Therapy Session Note  Patient Details  Name: Jacqueline [name] MRN: [id number] Date of Birth: 12-30-1965  Today's Date: 10/23/2016 PT Individual Time: [id number] and 1345-1412 and 1459-1539 PT Individual Time Calculation (min): 64 min and 27 min and 40 min   Short Term Goals: Week 2:  PT Short Term Goal 1 (Week 2): Pt will negotiate 12 steps with B rails & min assist.  PT Short Term Goal 2 (Week 2): Pt will be oriented to place & situation with max multimodal cuing. PT Short Term Goal 3 (Week 2): Pt will focus attention to functioanl task x 60 seconds with max multimodal cuing. PT Short Term Goal 4 (Week 2): Pt will ambulate with supervision.   Skilled Therapeutic Interventions/Progress Updates:    Treatment 1: Pt received asleep in bed but easily awakened with verbal stimulation. Pt noted her stomach hurt during session & RN made aware. Session focused on cognitive remediation & functional mobility. Pt ambulated within room to bathroom with min assist due to poor dynamic balance and inconsistent step length & width. Pt able to manage pants with min assist for toileting. Pt with incontinent BM and additional continent BM on toilet. Provided total assist for peri hygiene to ensure cleanliness. Pt perseverating on wanting something to drink and wanting a beer during session. Pt not open to education regarding no alcohol in hospital. Therapist oriented pt to date, location and time throughout session. Pt's brother stopped by during session & pt able to recall his name and name of mother & sister. Pt able to engage in activity for up to 30 seconds at a time. Pt able to correctly identify high/low card from choice of two 100% of the time. Pt assembled most simple pipe tree shape (cross) with min cuing and from choice of pre-selected pieces. Pt returned to lying in bed with eyes closed multiple times during session. At end of session pt left in geri-chair with full tray table and at nurses station.    Treatment 2: Pt seen to makeup missed time from previous date. Session focused on cognitive remediation. Pt engaged in conversation with therapist with max cuing for appropriate language (no cursing) but pt unable to correct. 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 Therapist oriented pt to time, date, and location throughout session with pt able to recall we were in [place] x 1 occasion. Pt able to attend to game of Connect Four for 30-60 seconds at a time with min cuing. Pt able to engage in simple peg board design with min cuing and pre-selected pieces, self correcting errors 1/1 trials. At end of session pt left sitting in geri-chair with lap tray attached at nurses station.  Treatment 3: Pt received in RN; pt reported "I don't feel good" and "my stomach hurts" throughout session & RN made aware. Session focused on dynamic balance and cognitive remediation. Pt ambulated throughout unit (>250 ft) with min assist progressing to close supervision at times while in room. Pt able to ambulate without AD and demonstrates narrow base of support with occasional scissoring gait. Pt consumed 2 containers of thickened water & a magic cup during session. Pt able to remove lid from cup & plastic off of spoon with min cuing. Pt with poor awareness & poured water over magic cup twice despite max cuing and education not to do so. Throughout session therapist oriented pt to date, time and location. Pt required min cuing to engage in appropriate conversation. Pt able to calculate time between  now & dinner. At end of session pt left in enclosure bed with all needs within reach.   Therapy Documentation Precautions:  Precautions Precautions: Cervical, Fall, Other (comment) Precaution Comments: abdominal incision Required Braces or Orthoses: Cervical Brace Cervical Brace: Hard collar, At all times Restrictions Weight Bearing Restrictions: No    See Function Navigator for Current Functional  Status.   Therapy/Group: Individual Therapy  [name] 10/23/2016, 4:15 PM

## 2016-10-23 NOTE — Progress Notes (Signed)
Occupational Therapy Note  Patient Details  Name: Jacqueline [name] MRN: [id number] Date of Birth: Sep 26, 1966  Today's Date: 10/23/2016 OT Individual Time: 1300-1330 OT Individual Time Calculation (min): 30 min    PT denied pain Individual Therapy  Pt sitting in gerichair upon arrival with NT present.  Pt engaged in peg board tasks with focus on following one step commands, task initiation, sequencing, attention to task, and orientation.  Pt continues to require tot A for orientation.  Pt followed one step commands but required max verbal cues to redirect to task.  Pt continues to exhibit language of confusion requiring max verbal cues to correct. Pt remained in gerichair at nursing station.   [name] [facility] 10/23/2016, 2:12 PM

## 2016-10-23 NOTE — Progress Notes (Signed)
Occupational Therapy Session Note  Patient Details  Name: Jacqueline [name] MRN: [id number] Date of Birth: 02/16/1966  Today's Date: 10/23/2016 OT Individual Time: 7939-0300 OT Individual Time Calculation (min): 55 min     Short Term Goals: Week 1:  OT Short Term Goal 1 (Week 1): Pt will demonstrate improved praxis by using deoderant in the correct manner. OT Short Term Goal 1 - Progress (Week 1): Progressing toward goal OT Short Term Goal 2 (Week 1): Pt will demonstrate improved sitting balance EOB with S while engaging in UB self care. OT Short Term Goal 2 - Progress (Week 1): Met OT Short Term Goal 3 (Week 1): Pt will demonstrate improved standing balance of min A while engaging in LB self care. OT Short Term Goal 3 - Progress (Week 1): Met OT Short Term Goal 4 (Week 1): Pt will attend to self care task for at least 3 minutes without redirection.  OT Short Term Goal 4 - Progress (Week 1): Progressing toward goal OT Short Term Goal 5 (Week 1): Pt will demonstrate improved orientation to identify that she is in a hospital. OT Short Term Goal 5 - Progress (Week 1): Progressing toward goal  Skilled Therapeutic Interventions/Progress Updates:    Pt sitting EOB with RN present.  Pt transferred to gerichair with steady A to finish taking medications from RN.  Pt engaged in eating breakfast with focus on task initiation, sequencing, and attention to task.  Pt required tot A for orientation and max verbal cues for redirection to task.  Pt perseverated on having a cigarette and did not accept explanation that she could not have one while in the hospital.  Pt continues to mix her food items together.  Pt requested to use toilet and amb with HHA to bathroom.  Upon completion of using bathroom and washing her hands, pt returned to secured enclosure bed. Therapy Documentation Precautions:  Precautions Precautions: Cervical, Fall, Other (comment) Precaution Comments: abdominal incision Required  Braces or Orthoses: Cervical Brace Cervical Brace: Hard collar, At all times Restrictions Weight Bearing Restrictions: No Pain:  Pt denied pain  See Function Navigator for Current Functional Status.   Therapy/Group: Individual Therapy  Leroy Libman 10/23/2016, 8:56 AM

## 2016-10-24 ENCOUNTER — Inpatient Hospital Stay (HOSPITAL_COMMUNITY): Payer: Self-pay | Admitting: *Deleted

## 2016-10-24 ENCOUNTER — Inpatient Hospital Stay (HOSPITAL_COMMUNITY): Payer: Medicaid Other | Admitting: Physical Therapy

## 2016-10-24 ENCOUNTER — Inpatient Hospital Stay (HOSPITAL_COMMUNITY): Payer: Medicaid Other | Admitting: Speech Pathology

## 2016-10-24 ENCOUNTER — Inpatient Hospital Stay (HOSPITAL_COMMUNITY): Payer: Self-pay

## 2016-10-24 MED ORDER — VALPROIC ACID 250 MG PO CAPS
500.0000 mg | ORAL_CAPSULE | Freq: Three times a day (TID) | ORAL | Status: DC
  Administered 2016-10-24 – 2016-10-26 (×5): 500 mg via ORAL
  Filled 2016-10-24 (×7): qty 2

## 2016-10-24 MED ORDER — RISPERIDONE 0.5 MG PO TBDP
0.2500 mg | ORAL_TABLET | Freq: Two times a day (BID) | ORAL | Status: DC
  Administered 2016-10-24 – 2016-10-30 (×11): 0.25 mg via ORAL
  Filled 2016-10-24 (×14): qty 0.5

## 2016-10-24 MED ORDER — HYDROCODONE-ACETAMINOPHEN 7.5-325 MG PO TABS
1.0000 | ORAL_TABLET | Freq: Four times a day (QID) | ORAL | Status: DC | PRN
  Administered 2016-10-25 – 2016-12-12 (×71): 1 via ORAL
  Filled 2016-10-24 (×74): qty 1

## 2016-10-24 MED ORDER — RISPERIDONE 2 MG PO TBDP
2.0000 mg | ORAL_TABLET | Freq: Every day | ORAL | Status: DC
  Administered 2016-10-24 – 2016-12-10 (×48): 2 mg via ORAL
  Filled 2016-10-24 (×48): qty 1

## 2016-10-24 MED ORDER — SIMETHICONE 40 MG/0.6ML PO SUSP
40.0000 mg | Freq: Four times a day (QID) | ORAL | Status: DC | PRN
  Administered 2016-10-24: 40 mg via ORAL
  Filled 2016-10-24 (×2): qty 0.6

## 2016-10-24 MED ORDER — POTASSIUM CHLORIDE CRYS ER 20 MEQ PO TBCR
20.0000 meq | EXTENDED_RELEASE_TABLET | Freq: Two times a day (BID) | ORAL | Status: DC
  Administered 2016-10-24 – 2016-10-26 (×5): 20 meq via ORAL
  Filled 2016-10-24 (×5): qty 1

## 2016-10-24 MED ORDER — CITALOPRAM HYDROBROMIDE 20 MG PO TABS
20.0000 mg | ORAL_TABLET | Freq: Every day | ORAL | Status: DC
  Administered 2016-10-24 – 2016-11-13 (×21): 20 mg via ORAL
  Filled 2016-10-24 (×22): qty 1

## 2016-10-24 MED ORDER — LEVETIRACETAM 500 MG PO TABS
500.0000 mg | ORAL_TABLET | Freq: Two times a day (BID) | ORAL | Status: DC
  Administered 2016-10-24 – 2016-10-25 (×4): 500 mg via ORAL
  Filled 2016-10-24 (×5): qty 1
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
## 2016-10-24 NOTE — Progress Notes (Signed)
Physical Therapy Weekly Progress Note  Patient Details  Name: Jacqueline [name] MRN: [id number] Date of Birth: 07-09-1966  Beginning of progress report period: October 17, 2016 End of progress report period: October 24, 2016  Today's Date: 10/24/2016  Patient has met 1 of 4 short term goals.  Pt is making fair progress towards safe functional mobility. Pt is limited by poor attention to tasks, as well as significantly impaired awareness, which hinders ability to attempt stair negotiation. Pt continues to perseverate on insatiable thirst during sessions and is still not oriented to location, time, or situation. Pt would benefit from continued treatment to focus on cognitive remediation, balance, gait training, and pt/family education.  Patient continues to demonstrate the following deficits muscle weakness, decreased cardiorespiratoy endurance, decreased coordination, decreased initiation, decreased attention, decreased awareness, decreased problem solving, decreased safety awareness, decreased memory and delayed processing, and decreased standing balance, decreased postural control, decreased balance strategies and difficulty maintaining precautions and therefore will continue to benefit from skilled PT intervention to increase functional independence with mobility.  Patient progressing toward long term goals..  Continue plan of care.  PT Short Term Goals Week 2:  PT Short Term Goal 1 (Week 2): Pt will negotiate 12 steps with B rails & min assist.  PT Short Term Goal 1 - Progress (Week 2): Not met (unable to attempt 2/2 poor attention to tasks) PT Short Term Goal 2 (Week 2): Pt will be oriented to place & situation with max multimodal cuing. PT Short Term Goal 2 - Progress (Week 2): Progressing toward goal PT Short Term Goal 3 (Week 2): Pt will focus attention to functional task x 60 seconds with max multimodal cuing. PT Short Term Goal 3 - Progress (Week 2): Met PT Short Term Goal 4 (Week 2):  Pt will ambulate with supervision. PT Short Term Goal 4 - Progress (Week 2): Progressing toward goal Week 3:  PT Short Term Goal 1 (Week 3): Pt will consistently ambulate with supervision assist. PT Short Term Goal 2 (Week 3): Pt will negotiate 12 steps with B rails and min assist. PT Short Term Goal 3 (Week 3): Pt will be oriented to place with min cuing.     Therapy Documentation  Precautions:  Precautions Precautions: Cervical, Fall, Other (comment) Precaution Comments: abdominal incision Required Braces or Orthoses: Cervical Brace Cervical Brace: Hard collar, At all times Restrictions Weight Bearing Restrictions: No  [name] 10/24/2016, 6:39 PM

## 2016-10-24 NOTE — Progress Notes (Signed)
Speech Language Pathology Weekly Progress and Session Note  Patient Details  Name: Jacqueline [name] MRN: [id number] Date of Birth: 1966/06/18  Beginning of progress report period: October 17, 2016 End of progress report period: October 24, 2016  Today's Date: 10/24/2016 SLP Individual Time: 1000-1100 SLP Individual Time Calculation (min): 60 min   Short Term Goals: Week 2: SLP Short Term Goal 1 (Week 2): Patient will consume trials of thin liquids with minimal overt s/s of aspiration with Mod A verbal cues for use of slow rate over 2 sessions prior to upgrade.  SLP Short Term Goal 1 - Progress (Week 2): Met SLP Short Term Goal 2 (Week 2): Patient will consume trials of upgraded textures over 2 sessions with complete oral clearance and without overt s/s of aspiration over 2 sessions with Max A verbal cues.  SLP Short Term Goal 2 - Progress (Week 2): Met SLP Short Term Goal 3 (Week 2): Patient will demonstrate focused attention to a task for 60 seconds with Max A multimodal cues.  SLP Short Term Goal 3 - Progress (Week 2): Not met SLP Short Term Goal 4 (Week 2): Patient will leave c-collar in place for 10 minutes with Max A multimodal cues.  SLP Short Term Goal 4 - Progress (Week 2): Discontinued (comment) SLP Short Term Goal 5 (Week 2): Patient will orient to place and situation with Max A multimodal cues.  SLP Short Term Goal 5 - Progress (Week 2): Met    New Short Term Goals: Week 3: SLP Short Term Goal 1 (Week 3): Patient will consume trial tray of Dys. 2 textures and demonstrate efficient mastication with compelte oral clearance and without overt s/s of aspiration with Mod A verbal cues over 2 sessions prior to upgrade.  SLP Short Term Goal 2 (Week 3): Patient will demonstrate focused attention to a task for 60 seconds with Max A multimodal cues.  SLP Short Term Goal 3 (Week 3): Patient will orient to place and situation with Mod A multimodal cues.  SLP Short Term Goal 4 (Week 3):  Patient will demonstrate basic problem solving for self-care tasks with Max A multimodal cues.  SLP Short Term Goal 5 (Week 3): Patient will identify 1 cognitive and 1 physical deficit with Max A multimodal cues.   Weekly Progress Updates: Patient has made functional gains and has met 3 of 4 STG's this reporting period. Currently, patient is demonstrating behaviors consistent with a Rancho Level V and requires overall Max-Total A to complete functional and familiar tasks safely in regards functional problem solving, focused attention, recall of new information, and intelectual awareness. Patient continues to demonstrate verbosity with language of confusion and perseveration. Patient is currently consuming Dys. 1 textures with thin liquids with minimal overt s/s of aspiration and requires Max A verbal cues for use of swallowing compensatory strategies. SLP has been facilitating ongoing trials of upgraded textures but patient continues to demonstrate decreased awareness to bolus with decreased oral clearance. Patient and family education is ongoing. Patient would benefit from continued skilled SLP intervention to maximize her cognitive and swallowing function and overall functional independence prior to discharge.    Intensity: Minumum of 1-2 x/day, 30 to 90 minutes Frequency: 3 to 5 out of 7 days Duration/Length of Stay: TBD due to SNF placement  Treatment/Interventions: Cognitive remediation/compensation;Cueing hierarchy;Functional tasks;Patient/family education;Therapeutic Activities;Internal/external aids;Dysphagia/aspiration precaution training;Environmental controls   Daily Session  Skilled Therapeutic Interventions: Skilled treatment session focused on cognitive and dysphagia goals. SLP facilitated session by providing trials  of thin liquids. Patient consumed trials of thin liquids without overt s/s of aspiration despite large, sequential sips via cup. Therefore, recommend patient upgrade to thin  liquids. Patient also consumed trials of Dys. 3 textures and required Max A verbal cues for attention to bolus. Recommend patient continue Dys. 1 textures. Patient independently requested to use the bathroom and required Max A verbal cues for focused attention to task. Patient also required Max A verbal cues for focused attention to trials for ~30 seconds with extreme verbosity and language of confusion thoruhgout session despite. Patient handed off to PT. Continue with current plan of care.       Function:   Eating Eating   Modified Consistency Diet: Yes Eating Assist Level: Supervision or verbal cues;Set up assist for   Eating Set Up Assist For: Opening containers       Cognition Comprehension Comprehension assist level: Understands basic 25 - 49% of the time/ requires cueing 50 - 75% of the time  Expression   Expression assist level: Expresses basic needs/ideas: With no assist  Social Interaction Social Interaction assist level: Interacts appropriately 25 - 49% of time - Needs frequent redirection.  Problem Solving Problem solving assist level: Solves basic 25 - 49% of the time - needs direction more than half the time to initiate, plan or complete simple activities  Memory Memory assist level: Recognizes or recalls less than 25% of the time/requires cueing greater than 75% of the time   General    Pain Pain Assessment Pain Assessment: No/denies pain Faces Pain Scale: No hurt  Therapy/Group: Individual Therapy  Ramirez Fullbright 10/24/2016, 12:30 PM

## 2016-10-24 NOTE — Progress Notes (Addendum)
Occupational Therapy Session Note  Patient Details  Name: Jacqueline [name] MRN: [id number] Date of Birth: 11-Nov-1965  Today's Date: 10/24/2016 OT Individual Time: [id number] OT Individual Time Calculation (min): 73 min     Short Term Goals: Week 2:  OT Short Term Goal 1 (Week 2): Pt will demonstrate improved praxis by using deoderant in the correct manner with min verbal cues OT Short Term Goal 2 (Week 2): Pt will attend to self care task for at least 3 minutes without redirection.  OT Short Term Goal 3 (Week 2): Pt will demonstrate improved orientation to identify that she is in a hospital with mod verbal cues  Skilled Therapeutic Interventions/Progress Updates:    Pt eating breakfast upon arrival with NT present. Pt had mixed all ingredients, including liquid, together and was eating with fork.  Pt required max verbal cues to attend to task so as not to spill food on her or her tray.  Pt with limited awareness of situation.  Pt required tot A for orientation to place, date, or situation.  Pt's conversation was extremely tangential.  Pt exhibited language of confusion throughout session.  Pt continues to confuse this therapist and other staff with people she knows from different settings and occasionally becomes verbally agitated if corrected.  Pt requested to use toilet X 2 and had small bowel movements.  Pt able to complete toileting tasks with steady A.  Focus on task initiation, sequencing, attention to task, orientation, and safety awareness. Therapy Documentation Precautions:  Precautions Precautions: Cervical, Fall, Other (comment) Precaution Comments: abdominal incision Required Braces or Orthoses: Cervical Brace Cervical Brace: Hard collar, At all times Restrictions Weight Bearing Restrictions: No  Pain:  Pt c/o abdominal pain; RN aware  See Function Navigator for Current Functional Status.   Therapy/Group: Individual Therapy  Leroy Libman 10/24/2016, 9:15 AM

## 2016-10-24 NOTE — Progress Notes (Signed)
Physical Therapy Session Note  Patient Details  Name: Jacqueline [name] MRN: [id number] Date of Birth: May 20, 1966  Today's Date: 10/24/2016 PT Individual Time: 1103-1230 PT Individual Time Calculation (min): 87 min     Skilled Therapeutic Interventions/Progress Updates:    Pt received in room. Throughout session pt noted "I don't feel good" and RN made aware. Session focused on dynamic balance during gait training, functional mobility & cognitive remediation. Pt ambulated throughout unit with min assist overall 2/2 inconsistent step length and width, with pt occasionally demonstrating scissoring gait pattern. Pt required moderate cuing for appropriate behavior as she would curse frequently during conversation with poor awareness and inability to correct; pt also attempted to kiss rehab tech on cheek and would frequently say "I love you" to therapist and tech. Pt demonstrated language of confusion throughout session and perseverated on wanting a cigarette or something to drink. Pt consumed 3 containers of thickened liquid and a few bites of lunch meal during session with max cuing for small sips/bites. Pt with continued inappropriate behavior throughout session, throwing water onto floor and mixing food and drinks together despite max cuing and education. Pt engaged in cognitive tasks (2 peg board designs with pre-selected pieces and playing checkers) with min<>mod cuing for correctness and to attend to task. Pt able to toss ball, use nu-step (level 1 x 5 minutes) and match cards to Velcro board with min cuing. Pt able to solve 5/6 simple math problems correctly, self-correcting herself at times without cuing, but unable to complete a pattern of shapes. Pt perseverated on therapist being her daughter despite max cuing/education. During session pt reported need to use bathroom and had small continent BM; pt managed clothing without assistance but therapist provided total assist for peri-hygiene to ensure  cleanliness. At end of session pt left sitting in geri-chair with lap tray donned at nurses station.   Therapy Documentation Precautions:  Precautions Precautions: Cervical, Fall, Other (comment) Precaution Comments: abdominal incision Required Braces or Orthoses: Cervical Brace Cervical Brace: Hard collar, At all times Restrictions Weight Bearing Restrictions: No   See Function Navigator for Current Functional Status.   Therapy/Group: Individual Therapy  [name] 10/24/2016, 6:30 PM

## 2016-10-24 NOTE — Progress Notes (Signed)
Occupational Therapy Note  Patient Details  Name: Jacqueline [name] MRN: [id number] Date of Birth: [birth year]  Today's Date: 10/24/2016 OT Individual Time: [id number] OT Individual Time Calculation (min): 54 min    Pt c/o abdominal pain; pt used toilet which relieved discomfort Individual Therapy  Pt engaged in peg board task with focus on following directions and attending to task.  Pt completed task with min verbal cues to redirect to task and for accuracy.  Pt also engaged in map task with focus on attention to task.  Pt able to attend to task 15 mins X 2 with min verbal cues for redirection.  Pt continues to perseverate on smoking and drinking alcohol and becomes verbally agitated when informed that those substances are not allowed in hospital.  Pt requested to speak to MD so she could go home and return again in the morning.  Pt remained in gierichair at nursing station at end of session.   [name] [facility] 10/24/2016, 1:57 PM

## 2016-10-25 ENCOUNTER — Inpatient Hospital Stay (HOSPITAL_COMMUNITY): Payer: Self-pay | Admitting: Physical Therapy

## 2016-10-25 ENCOUNTER — Inpatient Hospital Stay (HOSPITAL_COMMUNITY): Payer: Self-pay

## 2016-10-25 ENCOUNTER — Inpatient Hospital Stay (HOSPITAL_COMMUNITY): Payer: Medicaid Other | Admitting: Speech Pathology

## 2016-10-25 MED ORDER — CHLORDIAZEPOXIDE HCL 5 MG PO CAPS
5.0000 mg | ORAL_CAPSULE | Freq: Every day | ORAL | Status: DC
  Administered 2016-10-25 – 2016-10-27 (×3): 5 mg via ORAL
  Filled 2016-10-25 (×3): qty 1

## 2016-10-25 NOTE — Progress Notes (Signed)
Physical Therapy Note  Patient Details  Name: Jacqueline [name] MRN: [id number] Date of Birth: July 07, 1966 Today's Date: 10/25/2016    Time: [id number] 55 minutes  1:1 No c/o pain. Session focused on sustaining attention to therapeutic tasks and on awareness of surroundings and obstacles during gait. Pt still with ataxic gait but able to improve balance reactions to be able to gait with supervision this session. Pt continues to require mod cuing for attention to obstacles and max A for path finding.  Nu step x 6 minutes with pt able to attend 3 minutes at a time before requiring cues to continue activity.  Word search with pt able to attend 2 x 5 minutes with min cuing.  Pt improving attention but continues with decreased safety awareness and perseveration on drinking and smoking throughout session.    Time 2: 1300-1330 30 minutes  1:1 No c/o pain.  Pt performed gait throughout unit with close supervision, max cuing for awareness, ataxic gait.  Seated card matching task with pt needing cuing <10% of the time. When seated on edge of mat pt slid to knees on the floor. Pt states "my knees just got tired".  Pt able to get up with min A from floor.  Able to gait to room and pt asked to lay down to rest, missed final 15 minutes of session. [name] 10/25/2016, 10:41 AM

## 2016-10-25 NOTE — Progress Notes (Signed)
Occupational Therapy Session Note  Patient Details  Name: Jacqueline [name] MRN: [id number] Date of Birth: 03-21-1966  Today's Date: 10/25/2016 OT Individual Time: 1000-1055 OT Individual Time Calculation (min): 55 min    Short Term Goals: Week 2:  OT Short Term Goal 1 (Week 2): Pt will demonstrate improved praxis by using deoderant in the correct manner with min verbal cues OT Short Term Goal 2 (Week 2): Pt will attend to self care task for at least 3 minutes without redirection.  OT Short Term Goal 3 (Week 2): Pt will demonstrate improved orientation to identify that she is in a hospital with mod verbal cues  Skilled Therapeutic Interventions/Progress Updates:   Pt engaged in BADL retraining including bathing at shower level and dressing with sit<>stand from chair.  Pt initially unreceptive to shower but finally agreed, stating "let's get this over with." Abdomen was covered with plastic Shower Shield prior to shower.  Pt amb with steady A to bathroom to use toilet prior to shower.  Pt declined use of wash cloth and required max verbal cues for thoroughness during shower.  After shower and dressing pt became fixated on having a cigarette.  Pt required max multimodal cues to redirect.  At one time, pt began searching in room for pack of cigarettes that she "bought earlier in the day."  Pt returned to gerichair with tray and returned to nursing station at end of session.  Focus on orientation, task initiation, sequencing, attention to task, and safety awareness to increase independence with BADLs.   Therapy Documentation Precautions:  Precautions Precautions: Cervical, Fall, Other (comment) Precaution Comments: abdominal incision Required Braces or Orthoses: Cervical Brace Cervical Brace: Hard collar, At all times Restrictions Weight Bearing Restrictions: No General:   Vital Signs:  Pain: Pain Assessment Pain Assessment: No/denies pain Faces Pain Scale: No hurt ADL: ADL ADL  Comments: refer to functional navigator Exercises:   Other Treatments:    See Function Navigator for Current Functional Status.   Therapy/Group: Individual Therapy  Leroy Libman 10/25/2016, 11:01 AM
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
## 2016-10-25 NOTE — Plan of Care (Signed)
Problem: RH BOWEL ELIMINATION Goal: RH STG MANAGE BOWEL WITH ASSISTANCE STG Manage Bowel with mod Assistance.  Outcome: Progressing Staff continue to assist pt in ambulating and monitoring for continued impulsive behavior on ambulation to prevent injury.   Problem: RH BLADDER ELIMINATION Goal: RH STG MANAGE BLADDER WITH ASSISTANCE STG Manage Bladder With mod Assistance  Outcome: Progressing Goal is to have no further episodes of incontinence.  Problem: RH SKIN INTEGRITY Goal: RH STG SKIN FREE OF INFECTION/BREAKDOWN Skin free of infection/breakdown with mod assistance  Outcome: Progressing Continue dressings to abdominal wound and monitor for s/s infection, teach and reinforce care of wound to pt.

## 2016-10-25 NOTE — Plan of Care (Signed)
Problem: RH Car Transfers Goal: LTG Patient will perform car transfers with assist (PT) LTG: Patient will perform car transfers with assistance (PT).  Outcome: Not Applicable Date Met: 00/34/96 Due to d/c plan is SNF  Problem: RH Stairs Goal: LTG Patient will ambulate up and down stairs w/assist (PT) LTG: Patient will ambulate up and down # of stairs with assistance (PT)  Outcome: Not Applicable Date Met: 11/64/35 Due to d/c plan is SNF

## 2016-10-25 NOTE — Progress Notes (Signed)
Occupational Therapy Note  Patient Details  Name: Jacqueline [name] MRN: [id number] Date of Birth: [birth year]  Today's Date: 10/25/2016 OT Individual Time: 1345-1400 OT Individual Time Calculation (min): 15 min  and Today's Date: 10/25/2016 OT Missed Time: 30 Minutes Missed Time Reason: Patient fatigue  Pt denied pain Individual Therapy  Pt asleep in bed upon arrival.  Pt required max multimodal cues to arouse and unable to keep eyes open to actively participate in therapy.  Attempted to engaged pt in sitting task but pt unable to actively participate.  Pt returned to secured enclosure bed.   [name] [facility] 10/25/2016, 2:02 PM

## 2016-10-25 NOTE — Progress Notes (Signed)
Speech Language Pathology Daily Session Notes  Patient Details  Name: Jacqueline [name] MRN: [id number] Date of Birth: 1965/11/08  Today's Date: 10/25/2016  Session 1: SLP Individual Time: [id number] SLP Individual Time Calculation (min): 30 min    Session 2: SLP Individual Time: [id number] SLP Individual Time Calculation (min): 25 min  Short Term Goals: Week 3: SLP Short Term Goal 1 (Week 3): Patient will consume trial tray of Dys. 2 textures and demonstrate efficient mastication with compelte oral clearance and without overt s/s of aspiration with Mod A verbal cues over 2 sessions prior to upgrade.  SLP Short Term Goal 2 (Week 3): Patient will demonstrate focused attention to a task for 60 seconds with Max A multimodal cues.  SLP Short Term Goal 3 (Week 3): Patient will orient to place and situation with Mod A multimodal cues.  SLP Short Term Goal 4 (Week 3): Patient will demonstrate basic problem solving for self-care tasks with Max A multimodal cues.  SLP Short Term Goal 5 (Week 3): Patient will identify 1 cognitive and 1 physical deficit with Max A multimodal cues.   Skilled Therapeutic Interventions:   Session 1: Skilled treatment session focused on cognitive and dysphagia goals. Upon arrival, patient was asleep in enclosure bed but easily awakened to voice. Patient required extra time and Max A verbal cues to sit EOB and transfer to the [employer]. Patient required total A for orientation with consistent language of confusion throughout session despite Max A multimodal cues. Patient required Max A verbal cues for problem solving during self-care tasks and consumed thin liquids via cup without overt s/s of aspiration. Patient left upright in [place] at [employer] Continue with current plan of care.   Session 2: Skilled treatment session focused on dysphagia goals. SLP facilitated session by providing skilled observation with lunch meal of Dys. 2 textures with thin liquids. Patient  required Max A verbal cues for focused attention to mastication for ~30 seconds and consumed meal without overt s/s of aspiration. However, patient required Mod A verbal cues for complete oral clearance due to poor awareness of bolus at times. Recommend patient upgrade to Dys. 2 textures with continued full supervision to maximize safety. Directly after finishing her meal, patient reported stomach pain and was continent of bowel. RN aware. Patient left upright in [place] at [employer] Continue with current plan of care.    Function:  Eating Eating   Modified Consistency Diet: Yes Eating Assist Level: Set up assist for;Supervision or verbal cues   Eating Set Up Assist For: Opening containers       Cognition Comprehension Comprehension assist level: Understands basic 25 - 49% of the time/ requires cueing 50 - 75% of the time  Expression   Expression assist level: Expresses basic needs/ideas: With no assist  Social Interaction Social Interaction assist level: Interacts appropriately 25 - 49% of time - Needs frequent redirection.  Problem Solving Problem solving assist level: Solves basic 25 - 49% of the time - needs direction more than half the time to initiate, plan or complete simple activities  Memory Memory assist level: Recognizes or recalls less than 25% of the time/requires cueing greater than 75% of the time    Pain No/Denies Pain   Therapy/Group: Individual Therapy  Mechille Varghese 10/25/2016, 3:10 PM
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
## 2016-10-26 ENCOUNTER — Inpatient Hospital Stay (HOSPITAL_COMMUNITY): Payer: Self-pay

## 2016-10-26 ENCOUNTER — Inpatient Hospital Stay (HOSPITAL_COMMUNITY): Payer: Medicaid Other | Admitting: Speech Pathology

## 2016-10-26 ENCOUNTER — Inpatient Hospital Stay (HOSPITAL_COMMUNITY): Payer: Self-pay | Admitting: Physical Therapy

## 2016-10-26 ENCOUNTER — Inpatient Hospital Stay (HOSPITAL_COMMUNITY): Payer: Medicaid Other | Admitting: Physical Therapy

## 2016-10-26 MED ORDER — VALPROATE SODIUM 250 MG/5ML PO SOLN
500.0000 mg | Freq: Three times a day (TID) | ORAL | Status: DC
  Administered 2016-10-26 – 2016-10-29 (×11): 500 mg via ORAL
  Filled 2016-10-26 (×14): qty 10

## 2016-10-26 MED ORDER — POTASSIUM CHLORIDE 20 MEQ/15ML (10%) PO SOLN
20.0000 meq | Freq: Two times a day (BID) | ORAL | Status: DC
  Administered 2016-10-26 – 2016-10-29 (×5): 20 meq via ORAL
  Filled 2016-10-26 (×8): qty 15

## 2016-10-26 MED ORDER — LEVETIRACETAM 100 MG/ML PO SOLN
500.0000 mg | Freq: Two times a day (BID) | ORAL | Status: DC
  Administered 2016-10-26 – 2016-11-01 (×13): 500 mg via ORAL
  Filled 2016-10-26 (×13): qty 5

## 2016-10-26 NOTE — Progress Notes (Signed)
Occupational Therapy Note  Patient Details  Name: Jacqueline [name] MRN: [id number] Date of Birth: 1966/08/06  Today's Date: 10/26/2016 OT Individual Time: 1400-1430 OT Individual Time Calculation (min): 30 min   Pt denied pain Individual Therapy  Pt resting in gerichair upon arrival.  Pt engaged in word search problems while engaging in conversation and orientation activities.  Pt continues to exhibit language of confusion and is very tangential.  Pt requires max verbal cues for redirection to topic and task.  Pt requires max/tot A for orientation.  Pt remained in gerichair at nursing station.    [name] [facility] 10/26/2016, 2:55 PM

## 2016-10-26 NOTE — Progress Notes (Signed)
Occupational Therapy Weekly Progress Note  Patient Details  Name: Jacqueline [name] MRN: [id number] Date of Birth: 10/14/1965  Beginning of progress report period: October 19, 2015 End of progress report period: October 27, 2015  Patient has met 2 of 3 short term goals.  Pt made minimal and inconsistent gains in self care tasks and orientation.  Pt continues to requires max A/tot A for orientation to place, date, and situation.  Pt initiates self care tasks with mod/max multimodal cues when presented with supplies or clothing.  Pt's conversation continues to be tangential and pt requires max verbal cues to attend to subject matter.  Pt exhibits behaviors consistent with [place] Level V.  Patient continues to demonstrate the following deficits: muscle weakness, decreased cardiorespiratoy endurance, decreased attention, decreased awareness, decreased problem solving, decreased safety awareness, decreased memory and delayed processing and decreased standing balance, decreased balance strategies and difficulty maintaining precautions and therefore will continue to benefit from skilled OT intervention to enhance overall performance with BADL, iADL and Reduce care partner burden.  Patient progressing toward long term goals..  Continue plan of care.  OT Short Term Goals Week 2:  OT Short Term Goal 1 (Week 2): Pt will demonstrate improved praxis by using deoderant in the correct manner with min verbal cues OT Short Term Goal 1 - Progress (Week 2): Met OT Short Term Goal 2 (Week 2): Pt will attend to self care task for at least 3 minutes without redirection.  OT Short Term Goal 2 - Progress (Week 2): Met OT Short Term Goal 3 (Week 2): Pt will demonstrate improved orientation to identify that she is in a hospital with mod verbal cues OT Short Term Goal 3 - Progress (Week 2): Progressing toward goal Week 3:  OT Short Term Goal 1 (Week 3): Pt will demonstrate improved orientation to identify that she is in a  hospital with mod verbal cues OT Short Term Goal 2 (Week 3): Pt will initiate dressing tasks with min verbal cues when presented with clothing OT Short Term Goal 3 (Week 3): Pt will attend to bathing tasks for 5 minutes with min verbal cues        Therapy Documentation Precautions:  Precautions Precautions: Cervical, Fall, Other (comment) Precaution Comments: abdominal incision Required Braces or Orthoses: Cervical Brace Cervical Brace: Hard collar, At all times Restrictions Weight Bearing Restrictions: No  See Function Navigator for Current Functional Status.     Lanier, Thomas Chappell 10/26/2016, 6:37 AM   

## 2016-10-26 NOTE — Progress Notes (Signed)
Speech Language Pathology Daily Session Note  Patient Details  Name: Jacqueline [name] MRN: [id number] Date of Birth: Sep 20, 1966  Today's Date: 10/26/2016 SLP Individual Time: [id number] SLP Individual Time Calculation (min): 60 min  Short Term Goals: Week 3: SLP Short Term Goal 1 (Week 3): Patient will consume trial tray of Dys. 2 textures and demonstrate efficient mastication with compelte oral clearance and without overt s/s of aspiration with Mod A verbal cues over 2 sessions prior to upgrade.  SLP Short Term Goal 2 (Week 3): Patient will demonstrate focused attention to a task for 60 seconds with Max A multimodal cues.  SLP Short Term Goal 3 (Week 3): Patient will orient to place and situation with Mod A multimodal cues.  SLP Short Term Goal 4 (Week 3): Patient will demonstrate basic problem solving for self-care tasks with Max A multimodal cues.  SLP Short Term Goal 5 (Week 3): Patient will identify 1 cognitive and 1 physical deficit with Max A multimodal cues.   Skilled Therapeutic Interventions: Skilled treatment session focused on dysphagia and cognition goals. SLP facilitated session by providing skilled observation of Dysphagia 2 trials but pt only consumed min intake but effectively cleared. Pt able to sustain attention to tasks for  ~1 minute with Mod A cues. Pt required Max multimodal cues for orientation and recall of simple information related to her day. Pt unable to utilize visual/written cues for understanding. Pt required Mod A  verbal cues laundry tasks. Pt was returned to nursing station for supervision after therapy session ended. Continue per plan of care.      Function:  Eating Eating   Modified Consistency Diet: Yes Eating Assist Level: More than reasonable amount of time;Supervision or verbal cues   Eating Set Up Assist For: Opening containers Helper Scoops Food on Utensil: Occasionally Helper Brings Food to Mouth: Occasionally   Cognition Comprehension  Comprehension assist level: Understands basic 50 - 74% of the time/ requires cueing 25 - 49% of the time  Expression   Expression assist level: Expresses basic needs/ideas: With no assist  Social Interaction Social Interaction assist level: Interacts appropriately 25 - 49% of time - Needs frequent redirection.  Problem Solving Problem solving assist level: Solves basic 25 - 49% of the time - needs direction more than half the time to initiate, plan or complete simple activities  Memory Memory assist level: Recognizes or recalls less than 25% of the time/requires cueing greater than 75% of the time    Pain    Therapy/Group: Individual Therapy  Tomi Paddock 10/26/2016, 12:23 PM

## 2016-10-26 NOTE — Progress Notes (Signed)
Patient was given ativan 0.5mg  at 1146 for agitation. The ativan appeared to have a paradoxical effect, the patient became more agitated rather than less.

## 2016-10-26 NOTE — Progress Notes (Signed)
Patient awake.  Ambulated to bathroom with standby assist.  Patient continent of urine in toilet, but bed wet as well.  Patient then ambulated out of room.  Difficult to redirect.  Stated she wants coffee and a cigarette. Patient redirected to room after multiple attempts.  Up in chair and consumed one cup of coffee, then placed in enclosure bed for safety.  Looking at magazines and awaiting breakfast.    Stephanie Coup, Tyler Pita
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
## 2016-10-26 NOTE — Progress Notes (Signed)
Physical Therapy Session Note  Patient Details  Name: Jacqueline [name] MRN: [id number] Date of Birth: [birth year]  Today's Date: 10/26/2016 PT Individual Time: 1300-1400 PT Individual Time Calculation (min): 60 min   Short Term Goals: Week 3:  PT Short Term Goal 1 (Week 3): Pt will consistently ambulate with supervision assist. PT Short Term Goal 2 (Week 3): Pt will negotiate 12 steps with B rails and min assist. PT Short Term Goal 3 (Week 3): Pt will be oriented to place with min cuing.   Skilled Therapeutic Interventions/Progress Updates:    Session 1 - Pt ambulating throughout unit with close supervision for safety. Pt with poor safety awareness and needing frequent reorientation to task. Asking for something to drink throughout session despite liquids being provided. Nu-step X6 minutes for endurance with repeated orientation to task again. Assisting to bathroom X1 and hand hygiene. In [place] with lap tray in place at nursing station following session.   Session 2 - Pt refusing to ambulate or attempt standing despite multiple attempts. Pt was willing to sit and play cards. Working on seated unsupported trunk stability with reaching at variable angles and forward leaning. Activity including memory, sequencing, and problem solving.   Therapy Documentation Precautions:  Precautions Precautions: Cervical, Fall, Other (comment) Precaution Comments: abdominal incision Required Braces or Orthoses: Cervical Brace Cervical Brace: Hard collar, At all times Restrictions Weight Bearing Restrictions: No Pain: denies pain        See Function Navigator for Current Functional Status.   Therapy/Group: Individual Therapy  Linard Millers, PT, CSCS 10/26/2016, 3:43 PM

## 2016-10-26 NOTE — Progress Notes (Signed)
Physical Therapy Session Note  Patient Details  Name: Jacqueline [name] MRN: [id number] Date of Birth: [birth year]  Today's Date: 10/26/2016 PT Individual Time: 1115-1140 PT Individual Time Calculation (min): 25 min   Short Term Goals: Week 3:  PT Short Term Goal 1 (Week 3): Pt will consistently ambulate with supervision assist. PT Short Term Goal 2 (Week 3): Pt will negotiate 12 steps with B rails and min assist. PT Short Term Goal 3 (Week 3): Pt will be oriented to place with min cuing.   Skilled Therapeutic Interventions/Progress Updates:   Session focused on sustained attention to functional tasks and orientation with patient received on toilet, handoff from nurse tech [place]. Patient with several loose stools and complaints of not feeling well with stomach pain, RN and NT made aware. Patient performed hygiene and clothing management after toileting and hand hygiene with supervision. Patient ambulated throughout rehab unit without device with close supervision and verbal cues for environmental awareness to avoid obstacles to fix a drink at RN station, carry drink to day room, retrieve clothing from dryer, and return to room. Patient took 2 sips from drink before continuing to complain of not feeling well. Patient requested cigarettes x 3. Patient demonstrating language of confusion regarding AM events and not oriented to place. Patient removed clothing from dryer but did not recognize clothing as hers and therefore declined to carry them back to room. Patient perseverative on going home and donned shoes in room, stating "I have a suitcase we can put the clothes in." Patient redirected to [place] with lap tray in place and left sitting at nursing station.   Therapy Documentation Precautions:  Precautions Precautions: Cervical, Fall, Other (comment) Precaution Comments: abdominal incision Required Braces or Orthoses: Cervical Brace Cervical Brace: Hard collar, At all  times Restrictions Weight Bearing Restrictions: No Pain:  Unrated stomach pain, generalized c/o "not feeling good"  See Function Navigator for Current Functional Status.  Therapy/Group: Individual Therapy  Dayvon Dax, Murray Hodgkins 10/26/2016, 11:49 AM

## 2016-10-26 NOTE — Progress Notes (Signed)
Occupational Therapy Session Note  Patient Details  Name: Jacqueline [name] MRN: [id number] Date of Birth: 10/07/1966  Today's Date: 10/26/2016 OT Individual Time: [id number] OT Individual Time Calculation (min): 56 min    Short Term Goals: Week 3:  OT Short Term Goal 1 (Week 3): Pt will demonstrate improved orientation to identify that she is in a hospital with mod verbal cues OT Short Term Goal 2 (Week 3): Pt will initiate dressing tasks with min verbal cues when presented with clothing OT Short Term Goal 3 (Week 3): Pt will attend to bathing tasks for 5 minutes with min verbal cues   Skilled Therapeutic Interventions/Progress Updates:    Pt resting in chair upon arrival.  Pt engaged in pathfinding tasks and dynamic standing activities.  Pt required tot A for pathfinding tasks and unable to remember room number.  Pt transitioned to table task with pipe tree.  Pt required tot A to complete task.  Pt perseverative on going home with anyone.  Pt required tot A for orientation.  Pt remained in gerichair at nursing station.   Therapy Documentation Precautions:  Precautions Precautions: Cervical, Fall, Other (comment) Precaution Comments: abdominal incision Required Braces or Orthoses: Cervical Brace Cervical Brace: Hard collar, At all times Restrictions Weight Bearing Restrictions: No Pain:  Pt c/o of abdominal pain; RN aware  See Function Navigator for Current Functional Status.   Therapy/Group: Individual Therapy  Leroy Libman 10/26/2016, 8:58 AM

## 2016-10-27 ENCOUNTER — Inpatient Hospital Stay (HOSPITAL_COMMUNITY): Payer: Self-pay | Admitting: Occupational Therapy

## 2016-10-27 NOTE — Progress Notes (Signed)
Occupational Therapy Session Note  Patient Details  Name: Jacqueline [name] MRN: [id number] Date of Birth: January 06, 1966  Today's Date: 10/27/2016 OT Individual Time: 5929-2446 OT Individual Time Calculation (min): 49 min    Short Term Goals: Week 1:  OT Short Term Goal 1 (Week 1): Pt will demonstrate improved praxis by using deoderant in the correct manner. OT Short Term Goal 1 - Progress (Week 1): Progressing toward goal OT Short Term Goal 2 (Week 1): Pt will demonstrate improved sitting balance EOB with S while engaging in UB self care. OT Short Term Goal 2 - Progress (Week 1): Met OT Short Term Goal 3 (Week 1): Pt will demonstrate improved standing balance of min A while engaging in LB self care. OT Short Term Goal 3 - Progress (Week 1): Met OT Short Term Goal 4 (Week 1): Pt will attend to self care task for at least 3 minutes without redirection.  OT Short Term Goal 4 - Progress (Week 1): Progressing toward goal OT Short Term Goal 5 (Week 1): Pt will demonstrate improved orientation to identify that she is in a hospital. OT Short Term Goal 5 - Progress (Week 1): Progressing toward goal Week 2:  OT Short Term Goal 1 (Week 2): Pt will demonstrate improved praxis by using deoderant in the correct manner with min verbal cues OT Short Term Goal 1 - Progress (Week 2): Met OT Short Term Goal 2 (Week 2): Pt will attend to self care task for at least 3 minutes without redirection.  OT Short Term Goal 2 - Progress (Week 2): Met OT Short Term Goal 3 (Week 2): Pt will demonstrate improved orientation to identify that she is in a hospital with mod verbal cues OT Short Term Goal 3 - Progress (Week 2): Progressing toward goal Week 3:  OT Short Term Goal 1 (Week 3): Pt will demonstrate improved orientation to identify that she is in a hospital with mod verbal cues OT Short Term Goal 2 (Week 3): Pt will initiate dressing tasks with min verbal cues when presented with clothing OT Short Term Goal 3 (Week  3): Pt will attend to bathing tasks for 5 minutes with min verbal cues :     Skilled Therapeutic Interventions/Progress Updates: Skilled OT session completed with focus on attention, activity tolerance, and UE strengthening. Pt was in care of nursing at time of arrival, agreeable to tx. She was taken in geri chair to dayroom to complete table/countertop washing activity. Pt reported that she enjoyed cleaning "everything." She was very engaged in task, recalling which table sections she had washed and other sections that she needed to complete. Pt then reported needing to void. She was returned to room and ambulated into bathroom with supervision, completing all toileting tasks and handwashing with supervision for safety. Pt was then taken to nursing station per request. Before OT left, she reported wanting to go back to room to watch TV and exhibited increased signs of restlessness. Pt was then taken back to room and returned to secure enclosure bed. Max cues for redirecting tangential speech throughout tx (frequently requesting cigarettes). Pt was left with all needs within reach at time of departure.      Therapy Documentation Precautions:  Precautions Precautions: Cervical, Fall, Other (comment) Precaution Comments: abdominal incision Required Braces or Orthoses: Cervical Brace Cervical Brace: Hard collar, At all times Restrictions Weight Bearing Restrictions: No   Pain: No c/o pain during session    ADL: ADL ADL Comments: refer to functional navigator  See Function Navigator for Current Functional Status.   Therapy/Group: Individual Therapy  Broady Lafoy A Yasmeen Manka 10/27/2016, 4:33 PM
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
## 2016-10-27 NOTE — Plan of Care (Signed)
Problem: RH SAFETY Goal: RH STG ADHERE TO SAFETY PRECAUTIONS W/ASSISTANCE/DEVICE STG Adhere to Safety Precautions With mod  Assistance/Device.  Outcome: Not Progressing Continues to be hard to redirect and impulsive.

## 2016-10-28 ENCOUNTER — Inpatient Hospital Stay (HOSPITAL_COMMUNITY): Payer: Medicaid Other | Admitting: Physical Therapy

## 2016-10-28 MED ORDER — CHLORDIAZEPOXIDE HCL 5 MG PO CAPS
10.0000 mg | ORAL_CAPSULE | Freq: Every day | ORAL | Status: DC
  Administered 2016-10-28 – 2016-11-19 (×23): 10 mg via ORAL
  Filled 2016-10-28 (×23): qty 2

## 2016-10-28 NOTE — Progress Notes (Signed)
Physical Therapy Session Note  Patient Details  Name: Jacqueline [name] MRN: [id number] Date of Birth: May 18, 1966  Today's Date: 10/28/2016 PT Individual Time: 1020-1103 PT Individual Time Calculation (min): 43 min   Short Term Goals: Week 3:  PT Short Term Goal 1 (Week 3): Pt will consistently ambulate with supervision assist. PT Short Term Goal 2 (Week 3): Pt will negotiate 12 steps with B rails and min assist. PT Short Term Goal 3 (Week 3): Pt will be oriented to place with min cuing.   Skilled Therapeutic Interventions/Progress Updates:    Pt received in enclosure bed; pt without behaviors demonstrating pain during session. Session focused on dynamic balance during gait, cognitive remediation and sustained attention. Pt ambulated throughout unit (room>dayroom>gym) without AD & close supervision<>min assist 2/2 inconsistent gait pattern and occasional scissoring gait. Pt with continent void on toilet during session and performed peri-hygiene with close supervision. Pt consumed 3 cups of water during session with max cuing for small sips but poor demo despite cuing. Pt participated in card matching game on Velcro board with cuing for correct matches 50% of the time; pt able to sustain attention to task for 13 minutes without cuing. Pt required max/total assist for orientation to location & situation with no carry over during session. At end of session pt left sitting in chair in room in handoff to RN & NT.  Therapy Documentation Precautions:  Precautions Precautions: Cervical, Fall, Other (comment) Precaution Comments: abdominal incision Required Braces or Orthoses: Cervical Brace Cervical Brace: Hard collar, At all times Restrictions Weight Bearing Restrictions: No   See Function Navigator for Current Functional Status.   Therapy/Group: Individual Therapy  [name] 10/28/2016, 12:46 PM
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
## 2016-10-28 NOTE — Progress Notes (Signed)
Patient up at requesting to go to bathroom.  This nurse and Ciera, NT, in to assist patient to bathroom.  Patient voided continent in toilet, got up, and immediately attempted to get out of room saying she was going to get coffee and a cigarette.  Attempted to redirect patient without success.  Patient became aggressive towards staff.  Swinging fists and pressing body against staff asking them if they "want to fight".  Patient unsteady on feet and stumbling backwards at times when swinging, yelling and cursing staff members.  Patient was assisted to bed with the help of four staff members total.  Refused dressing change to abdomen.  Refused medication.    Fredna Dow M

## 2016-10-29 ENCOUNTER — Inpatient Hospital Stay (HOSPITAL_COMMUNITY): Payer: Medicaid Other | Admitting: Physical Therapy

## 2016-10-29 ENCOUNTER — Inpatient Hospital Stay (HOSPITAL_COMMUNITY): Payer: Self-pay

## 2016-10-29 ENCOUNTER — Inpatient Hospital Stay (HOSPITAL_COMMUNITY): Payer: Medicaid Other | Admitting: Speech Pathology

## 2016-10-29 ENCOUNTER — Inpatient Hospital Stay (HOSPITAL_COMMUNITY): Payer: Self-pay | Admitting: Physical Therapy

## 2016-10-29 LAB — BASIC METABOLIC PANEL
Anion gap: 9 (ref 5–15)
CALCIUM: 8.3 mg/dL — AB (ref 8.9–10.3)
CO2: 28 mmol/L (ref 22–32)
CREATININE: 0.75 mg/dL (ref 0.44–1.00)
Chloride: 100 mmol/L — ABNORMAL LOW (ref 101–111)
GFR calc non Af Amer: >60 mL/min (ref >60)
Glucose, Bld: 81 mg/dL (ref 65–99)
Potassium: 4.1 mmol/L (ref 3.5–5.1)
SODIUM: 137 mmol/L (ref 135–145)

## 2016-10-29 LAB — VALPROIC ACID LEVEL: VALPROIC ACID LVL: 49 ug/mL — AB (ref 50.0–100.0)

## 2016-10-29 LAB — CBC
HCT: 32.5 % — ABNORMAL LOW (ref 36.0–46.0)
Hemoglobin: 10.2 g/dL — ABNORMAL LOW (ref 12.0–15.0)
MCH: 29.5 pg (ref 26.0–34.0)
MCHC: 31.4 g/dL (ref 30.0–36.0)
MCV: 93.9 fL (ref 78.0–100.0)
PLATELETS: 488 10*3/uL — AB (ref 150–400)
RBC: 3.46 MIL/uL — ABNORMAL LOW (ref 3.87–5.11)
RDW: 19.9 % — AB (ref 11.5–15.5)
WBC: 10.3 10*3/uL (ref 4.0–10.5)

## 2016-10-29 NOTE — Progress Notes (Signed)
Speech Language Pathology Daily Session Note  Patient Details  Name: Jacqueline [name] MRN: [id number] Date of Birth: [birth year]  Today's Date: 10/29/2016 SLP Individual Time: 0900-0930 SLP Individual Time Calculation (min): 30 min  Short Term Goals: Week 3: SLP Short Term Goal 1 (Week 3): Patient will consume trial tray of Dys. 2 textures and demonstrate efficient mastication with compelte oral clearance and without overt s/s of aspiration with Mod A verbal cues over 2 sessions prior to upgrade.  SLP Short Term Goal 2 (Week 3): Patient will demonstrate focused attention to a task for 60 seconds with Max A multimodal cues.  SLP Short Term Goal 3 (Week 3): Patient will orient to place and situation with Mod A multimodal cues.  SLP Short Term Goal 4 (Week 3): Patient will demonstrate basic problem solving for self-care tasks with Max A multimodal cues.  SLP Short Term Goal 5 (Week 3): Patient will identify 1 cognitive and 1 physical deficit with Max A multimodal cues.   Skilled Therapeutic Interventions: Skilled treatment session focused on cognitive goals. SLP facilitated session by providing Min A verbal cues for selective attention to a cognitive task for ~15 minutes in a mildly distracting environment. Patient sequenced 4 and 6 step picture sequencing cards with Mod I for problem solving (utilized the answers on the back of the cards to problem solve). Patient intermittently requesting a cigarette and reported stomach pain. RN made aware and administered medications. Patient left upright in [place] with RN present. Continue with current plan of care.    Function:  Cognition Comprehension Comprehension assist level: Understands basic 50 - 74% of the time/ requires cueing 25 - 49% of the time  Expression   Expression assist level: Expresses basic needs/ideas: With no assist  Social Interaction Social Interaction assist level: Interacts appropriately 25 - 49% of time - Needs frequent  redirection.  Problem Solving Problem solving assist level: Solves basic 25 - 49% of the time - needs direction more than half the time to initiate, plan or complete simple activities  Memory Memory assist level: Recognizes or recalls 25 - 49% of the time/requires cueing 50 - 75% of the time    Pain Pain Assessment Pain Assessment: 0-10 Pain Score: 2  Pain Type: Surgical pain Pain Location: Abdomen Pain Intervention(s): Repositioned  Therapy/Group: Individual Therapy  Ellissa Ayo 10/29/2016, 11:03 AM

## 2016-10-29 NOTE — Progress Notes (Signed)
Physical Therapy Session Note  Patient Details  Name: Jacqueline [name] MRN: [id number] Date of Birth: [birth year]  Today's Date: 10/29/2016 PT Individual Time: 1430-1500 PT Individual Time Calculation (min): 30 min   Short Term Goals: Week 3:  PT Short Term Goal 1 (Week 3): Pt will consistently ambulate with supervision assist. PT Short Term Goal 2 (Week 3): Pt will negotiate 12 steps with B rails and min assist. PT Short Term Goal 3 (Week 3): Pt will be oriented to place with min cuing.   Skilled Therapeutic Interventions/Progress Updates:    Session focused on functional mobility including ambulation, bed mobility, and transfers. Close supervision for safety needed throughout session along with frequent reorientation to task. Sorting and organization task performed during session as focused attention able to be maintained. Ambulation performed throughout unit with pt demonstrating ability to path find back to her room inconsistently. Pt up in gerichair with tray and belt applied at nurses station.   Therapy Documentation Precautions:  Precautions Precautions: Cervical, Fall, Other (comment) Precaution Comments: abdominal incision Required Braces or Orthoses: Cervical Brace Cervical Brace: Hard collar, At all times Restrictions Weight Bearing Restrictions: No  Pain:  Denies any pain.   See Function Navigator for Current Functional Status.   Therapy/Group: Individual Therapy  Linard Millers, PT, CSCS 10/29/2016, 3:15 PM

## 2016-10-29 NOTE — Progress Notes (Signed)
[place] PHYSICAL MEDICINE & REHABILITATION     PROGRESS NOTE    Subjective/Complaints: Disinhibited, impulsive behavior still. Less agitation  ROS: pt denies nausea, vomiting, diarrhea, cough, shortness of breath or chest pain    Objective: Vital Signs: Blood pressure 114/76, pulse 88, temperature 98 F (36.7 C), temperature source Oral, resp. rate 18, weight 84.8 kg (186 lb 15.2 oz), last menstrual period 10/09/2016, SpO2 99 %. No results found. No results for input(s): WBC, HGB, HCT, PLT in the last 72 hours. No results for input(s): NA, K, CL, GLUCOSE, BUN, CREATININE, CALCIUM in the last 72 hours.  Invalid input(s): CO CBG (last 3)  No results for input(s): GLUCAP in the last 72 hours.  Wt Readings from Last 3 Encounters:  10/29/16 84.8 kg (186 lb 15.2 oz)  10/09/16 71.4 kg (157 lb 4.8 oz)  08/03/16 93 kg (205 lb)    Physical Exam:  Gen: NAD. Vital signs reviewed Eyes: No discharge. EOMI. Cardiovascular: RRR Respiratory: clear bilaterally GI: Soft, non-distended. Abdominal incision with bed of pink granulation which continues to close.  Musc: No edema, no tenderness Neurological: She is alert.  Motor: Moves all 4 ext Skin:  healing Psych: language of confusion. impulsive.     Assessment/Plan: 1. Functional, cognitive and behavioral deficits secondary to severe TBI which require 3+ hours per day of interdisciplinary therapy in a comprehensive inpatient rehab setting. Physiatrist is providing close team supervision and 24 hour management of active medical problems listed below. Physiatrist and rehab team continue to assess barriers to discharge/monitor patient progress toward functional and medical goals.  Function:  Bathing Bathing position Bathing activity did not occur: Refused Position: Production manager parts bathed by patient: Right arm, Left arm, Chest, Front perineal area, Buttocks, Right upper leg, Left upper leg, Right lower leg, Left lower  leg, Back Body parts bathed by helper: Right arm, Left arm, Front perineal area, Buttocks, Right upper leg, Left upper leg, Right lower leg, Left lower leg, Back  Bathing assist Assist Level: Supervision or verbal cues      Upper Body Dressing/Undressing Upper body dressing   What is the patient wearing?: Pull over shirt/dress     Pull over shirt/dress - Perfomed by patient: Thread/unthread right sleeve, Thread/unthread left sleeve, Put head through opening, Pull shirt over trunk Pull over shirt/dress - Perfomed by helper: Thread/unthread right sleeve, Thread/unthread left sleeve, Put head through opening, Pull shirt over trunk        Upper body assist Assist Level: Supervision or verbal cues      Lower Body Dressing/Undressing Lower body dressing   What is the patient wearing?: Underwear, Pants, Socks, Shoes Underwear - Performed by patient: Thread/unthread right underwear leg, Thread/unthread left underwear leg, Pull underwear up/down   Pants- Performed by patient: Thread/unthread right pants leg, Thread/unthread left pants leg, Pull pants up/down Pants- Performed by helper: Pull pants up/down Non-skid slipper socks- Performed by patient: Don/doff right sock, Don/doff left sock Non-skid slipper socks- Performed by helper: Don/doff right sock, Don/doff left sock Socks - Performed by patient: Don/doff right sock, Don/doff left sock   Shoes - Performed by patient: Don/doff right shoe, Don/doff left shoe            Lower body assist Assist for lower body dressing: Supervision or verbal cues      Toileting Toileting   Toileting steps completed by patient: Adjust clothing prior to toileting, Performs perineal hygiene, Adjust clothing after toileting Toileting steps completed by helper: Performs  perineal hygiene Toileting Assistive Devices: Grab bar or rail  Toileting assist Assist level: Supervision or verbal cues   Transfers Chair/bed transfer   Chair/bed transfer method:  Ambulatory Chair/bed transfer assist level: Supervision or verbal cues Chair/bed transfer assistive device: Armrests     Locomotion Ambulation     Max distance: 150 ft Assist level: Touching or steadying assistance (Pt > 75%)   Wheelchair   Type: Manual Max wheelchair distance: 110' Assist Level: Supervision or verbal cues  Cognition Comprehension Comprehension assist level: Understands basic 50 - 74% of the time/ requires cueing 25 - 49% of the time  Expression Expression assist level: Expresses basic needs/ideas: With no assist  Social Interaction Social Interaction assist level: Interacts appropriately 25 - 49% of time - Needs frequent redirection.  Problem Solving Problem solving assist level: Solves basic 25 - 49% of the time - needs direction more than half the time to initiate, plan or complete simple activities  Memory Memory assist level: Recognizes or recalls 25 - 49% of the time/requires cueing 50 - 75% of the time   Medical Problem List and Plan: 1. Decreased functional mobility  secondary to TBI/large right occipital epidural hematoma, mildly displaced right temporal bone fracture,Posterior cervical spine ligamentous injury with cervical collar, multiple rib fractures after walking in front of a bus             -continue CIR             -RLAS V+   -still with profound cognitive deficits 2.  DVT Prophylaxis/Anticoagulation: SCDs. Dopplers negative 3. Pain Management: Hycet as needed 4. Mood/agitation related to traumatic brain injury:   -sleep/wake better   -continue risperdal  0.25mg  bid and 2mg  qhs    -changed wellbutrin to celexa 20mg  qhs  -Klonopin  1 mg  BID  -maintain depakote at500mg  TID for agitation---re-check level Monday  -continue nicotine patch  -increased  librium QHS back to  10mg  dose at bedtime  -stopped bromocriptine over concerns of increased agitation 5. Neuropsych: This patient is capable of making decisions on her own behalf.              6.  Skin/Wound Care: wound clean and continues closing  -Surgery signed off for now. abx completed  -continue wet to dry dressings daily 7. Fluids/Electrolytes/Nutrition:   -recheck monday   -replacing potassium, decreased to 51meq tid  -potassium 4      -stool improved    8. Seizure prophylaxis. Keppra 500 mg twice a day 9. Dysphagia with decreased nutritional storage. Clear liquids with honey thick liquids 10. 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   -tolerated decannulation well   11. 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 Acute blood loss anemia. hgb 10.4  hgb 9.9 13. Polysubstance abuse. Provide counseling when appropriate 14. Escherichia coli UTI. Keflex completed  LOS (Days) 20 A FACE TO FACE EVALUATION WAS PERFORMED  Meredith Staggers, MD 10/29/2016 9:17 AM

## 2016-10-29 NOTE — Progress Notes (Signed)
Occupational Therapy Note  Patient Details  Name: NYSHIA LEAZER MRN: [id number] Date of Birth: [birth year]  Today's Date: 10/29/2016 OT Individual Time: 1400-1430 OT Individual Time Calculation (min): 30 min   Pt denied pain Individual Therapy  Pt resting in gerichair upon arrival.  Initial focus on orientation and attention to task.  Pt required tot A for orientation throughout session and continued to perseverate on being in [place].  Pt would frequently become argumentative when corrected that she was in [place].  Pt transitioned to functional amb in room (supervisoin level) for simple home mgmt tasks.  Pt easily distracted in room and required max verbal cues to attend to task.  Pt returned to gerichair at nursing station.    [name] [facility] 10/29/2016, 2:47 PM

## 2016-10-29 NOTE — Progress Notes (Signed)
Physical Therapy Session Note  Patient Details  Name: SAMARIAH SIMONES MRN: [id number] Date of Birth: 05-Dec-1965  Today's Date: 10/29/2016 PT Individual Time: 1005-1102 and [id number] PT Individual Time Calculation (min): 57 min and 55 min  Short Term Goals: Week 3:  PT Short Term Goal 1 (Week 3): Pt will consistently ambulate with supervision assist. PT Short Term Goal 2 (Week 3): Pt will negotiate 12 steps with B rails and min assist. PT Short Term Goal 3 (Week 3): Pt will be oriented to place with min cuing.   Skilled Therapeutic Interventions/Progress Updates:  Treatment 1: Pt received in enclosure bed. Pt noted my "stomach's upset" and her stomach hurt; RN made aware. Session focused on cognitive remediation and dynamic balance during gait. Pt reporting need to use restroom and ambulated within room to bathroom but unable to void or have BM. Pt able to manage clothing and perform hand hygiene standing at sink with close supervision. Pt ambulated room<>BI gym without AD and close supervision for balance. Pt impulsively transferred to sitting on rolling stool and required assistance to prevent slipping off seat. Pt engaged in game of checkers while standing for ~2 minutes before reporting need to rest. Pt frequently stated she did not feel well and was tired. Pt engaged in assembling pipe tree shapes and folding towels with sustained attention up to 10 minutes at a time. Pt unable to select correct size pieces for pipe tree shapes but able to assemble overall shape with min cuing. Pt consumed 2 cups of water during session with max cuing for small sips but poor demo. Pt requires max cuing for orientation, but on 2 occasions pt able to recall she is in "[facility]" hospital. Pt's brother arrived during session and pt recognized him and successfully recalled sister's name but then began talking about someone else who was not family but pt believed was. At end of session pt left in enclosure bed with all  needs within reach.  Treatment 2: Pt received in geri-chair at [place] on needing to find shoes to go home. Therapist educated pt on current location & situation but pt ambulated throughout room looking for shoes. Pt then redirected to gym where she engaged in peg board (complex shapes with min/mod cuing for accuracy), recalling names of plastic animals (max cuing for accuracy), and sorting plastic insect pieces by color. Pt able to demonstrate sustained attention for up to 15-20 minutes with supervision. Pt able to count and add numbers with moderate cuing for accuracy. Pt utilized nu-step level 1 x 11 minutes with all 4 extremities with min cuing for timing of activity. Pt negotiated 8 steps (3") with B rails and steady assist with cuing to decrease speed. At end of session pt left at nurses station sitting in geri chair with QRB & lap tray donned.  Pt able to recall that she is at [facility] in [place] one time during session but otherwise required max cuing for orientation.  Pt demonstrating language of confusion during session.      Therapy Documentation Precautions:  Precautions Precautions: Cervical, Fall, Other (comment) Precaution Comments: abdominal incision Required Braces or Orthoses: Cervical Brace Cervical Brace: Hard collar, At all times Restrictions Weight Bearing Restrictions: No    See Function Navigator for Current Functional Status.   Therapy/Group: Individual Therapy  [name] 10/29/2016, 5:07 PM

## 2016-10-29 NOTE — Progress Notes (Signed)
Occupational Therapy Session Note  Patient Details  Name: Jacqueline [name] MRN: [id number] Date of Birth: 01/13/1966  Today's Date: 10/29/2016 OT Individual Time: [id number] OT Individual Time Calculation (min): 54 min    Short Term Goals: Week 3:  OT Short Term Goal 1 (Week 3): Pt will demonstrate improved orientation to identify that she is in a hospital with mod verbal cues OT Short Term Goal 2 (Week 3): Pt will initiate dressing tasks with min verbal cues when presented with clothing OT Short Term Goal 3 (Week 3): Pt will attend to bathing tasks for 5 minutes with min verbal cues   Skilled Therapeutic Interventions/Progress Updates:    Pt initially engaged in self feeding tasks with focus on task initiation and attention to task.  Pt required max verbal cues to sustain attention to eating breakfast.  Pt continually picked up items on tray and table.  Pt did not mix any food items this morning.  Pt requested to use the toilet and amb at supervision level to bathroom and completed all tasks at supervision level.  Pt engaged in grooming tasks while standing at sink.  Pt c/o increased fatigue and requested to lay down in bed.  Pt remained in secured enclosure bed.    Therapy Documentation Precautions:  Precautions Precautions: Cervical, Fall, Other (comment) Precaution Comments: abdominal incision Required Braces or Orthoses: Cervical Brace Cervical Brace: Hard collar, At all times Restrictions Weight Bearing Restrictions: No Pain:  Pt denied pain  ADL: ADL ADL Comments: refer to functional navigator  See Function Navigator for Current Functional Status.   Therapy/Group: Individual Therapy  Leroy Libman 10/29/2016, 8:55 AM

## 2016-10-29 NOTE — Progress Notes (Signed)
Patient up and ambulated to bathroom without incident.  Dressing changed to abdominal wound.  Extra time needed to get patient back into enclosure bed.  Patient bartering and offering money for a "joint", a "ciggarette" or a "line".  Patient removed abdominal dressing and threw it on the floor and stated "I don't need it".    Jacqueline [name] M

## 2016-10-30 ENCOUNTER — Inpatient Hospital Stay (HOSPITAL_COMMUNITY): Payer: Self-pay

## 2016-10-30 ENCOUNTER — Inpatient Hospital Stay (HOSPITAL_COMMUNITY): Payer: Medicaid Other | Admitting: Physical Therapy

## 2016-10-30 ENCOUNTER — Inpatient Hospital Stay (HOSPITAL_COMMUNITY): Payer: Medicaid Other | Admitting: Speech Pathology

## 2016-10-30 MED ORDER — DIVALPROEX SODIUM 500 MG PO DR TAB
750.0000 mg | DELAYED_RELEASE_TABLET | Freq: Three times a day (TID) | ORAL | Status: DC
  Filled 2016-10-30: qty 1

## 2016-10-30 MED ORDER — VALPROATE SODIUM 250 MG/5ML PO SOLN
750.0000 mg | Freq: Three times a day (TID) | ORAL | Status: DC
  Administered 2016-10-30 – 2016-11-01 (×7): 750 mg via ORAL
  Filled 2016-10-30 (×8): qty 15

## 2016-10-30 MED ORDER — POTASSIUM CHLORIDE 20 MEQ/15ML (10%) PO SOLN
20.0000 meq | Freq: Every day | ORAL | Status: DC
  Administered 2016-10-31 – 2016-11-01 (×2): 20 meq via ORAL
  Filled 2016-10-30 (×2): qty 15

## 2016-10-30 NOTE — Progress Notes (Signed)
Occupational Therapy Session Note  Patient Details  Name: Jacqueline [name] MRN: [id number] Date of Birth: 08-19-1966  Today's Date: 10/30/2016 OT Individual Time: 0800-0900 OT Individual Time Calculation (min): 60 min    Short Term Goals: Week 3:  OT Short Term Goal 1 (Week 3): Pt will demonstrate improved orientation to identify that she is in a hospital with mod verbal cues OT Short Term Goal 2 (Week 3): Pt will initiate dressing tasks with min verbal cues when presented with clothing OT Short Term Goal 3 (Week 3): Pt will attend to bathing tasks for 5 minutes with min verbal cues   Skilled Therapeutic Interventions/Progress Updates:    Focus on orientation, self feeding, toilet transfers, toileting, grooming tasks, dressing tasks, task initiation, sequencing, and attention to task.  Pt initiated changing clothing when presented with clean clothing and completed task with supervision.  Pt completed toileting tasks and grooming tasks at supervision level.  Pt required tot A for orientation to place, year, and situation.  Pt often becomes argumentative when corrected on orientation responses.  Pt attends to functional tasks for approx 5 mins with min verbal cues for redirection.  Pt returned to [employer] and nursing station.    Therapy Documentation Precautions:  Precautions Precautions: Cervical, Fall, Other (comment) Precaution Comments: abdominal incision Required Braces or Orthoses: Cervical Brace Cervical Brace: Hard collar, At all times Restrictions Weight Bearing Restrictions: No   Pain:  Pt denies pain  See Function Navigator for Current Functional Status.   Therapy/Group: Individual Therapy  Leroy Libman 10/30/2016, 9:17 AM

## 2016-10-30 NOTE — Progress Notes (Signed)
Physical Therapy Session Note  Patient Details  Name: Jacqueline [name] MRN: [id number] Date of Birth: 09-26-1966  Today's Date: 10/30/2016 PT Individual Time: 1103-1200 PT Individual Time Calculation (min): 57 min   Short Term Goals: Week 3:  PT Short Term Goal 1 (Week 3): Pt will consistently ambulate with supervision assist. PT Short Term Goal 2 (Week 3): Pt will negotiate 12 steps with B rails and min assist. PT Short Term Goal 3 (Week 3): Pt will be oriented to place with min cuing.   Skilled Therapeutic Interventions/Progress Updates:    Pt received at nurses station. Session focused on dynamic balance during gait and cognitive remediation. Pt ambulated within room and around unit (room<>dayroom) without AD & close supervision. Pt with occasional loss of balance but able to correct without external aid from therapist. Pt performed toileting with continent void and supervision for peri hygiene. Pt engaged in card game and able to follow commands with min cuing for accuracy during simple tasks, but required max cuing for problem solving and accuracy during complex tasks. Pt with sustained attention to task up to 15 minutes at a time without cuing. Pt able to recall correct location & situation ~30% of the time during session with therapist reorienting her the remainder of the time. Pt perseverated on going home and occasionally wanting a cigarette throughout session, requiring max cuing for redirection. At end of session pt left at nurses station sitting in geri-chair with lap tray & QRB donned.   Therapy Documentation Precautions:  Precautions Precautions: Cervical, Fall, Other (comment) Precaution Comments: abdominal incision Required Braces or Orthoses: Cervical Brace Cervical Brace: Hard collar, At all times Restrictions Weight Bearing Restrictions: No  Pain: Pain Assessment Pain Assessment: Faces Faces Pain Scale: No hurt   See Function Navigator for Current Functional  Status.   Therapy/Group: Individual Therapy  [name] 10/30/2016, 12:21 PM

## 2016-10-30 NOTE — Progress Notes (Signed)
Occupational Therapy Note  Patient Details  Name: Jacqueline [name] MRN: [id number] Date of Birth: 21-Dec-1965  Today's Date: 10/30/2016 OT Individual Time: 1330-1400 OT Individual Time Calculation (min): 30 min   Pt denied pain Individual therapy  Pt resting in gerichair upon arrival.  Pt requested to use toilet and completed transfer and toileting tasks at supervision level.  Pt engaged in functional amb for path finding tasks with mod verbal cues.  Pt continues to require tot A for orientation with brief episodes of awareness.  Pt's conversation continues to be tangential with ongoing language of confusion.  Pt returned to [employer], QRB in place, resting at nursing station.    [name] [facility] 10/30/2016, 2:13 PM

## 2016-10-30 NOTE — Progress Notes (Signed)
[place] PHYSICAL MEDICINE & REHABILITATION     PROGRESS NOTE    Subjective/Complaints: Up with OT. No new complaints.   ROS: Unable to obtain due to cognitive/mental status issues.     Objective: Vital Signs: Blood pressure 104/60, pulse 80, temperature 97.7 F (36.5 C), temperature source Oral, resp. rate 18, weight 84.5 kg (186 lb 4.6 oz), last menstrual period 10/09/2016, SpO2 98 %. No results found.  Recent Labs  10/29/16 1108  WBC 10.3  HGB 10.2*  HCT 32.5*  PLT 488*    Recent Labs  10/29/16 1108  NA 137  K 4.1  CL 100*  GLUCOSE 81  BUN <5*  CREATININE 0.75  CALCIUM 8.3*   CBG (last 3)  No results for input(s): GLUCAP in the last 72 hours.  Wt Readings from Last 3 Encounters:  10/30/16 84.5 kg (186 lb 4.6 oz)  10/09/16 71.4 kg (157 lb 4.8 oz)  08/03/16 93 kg (205 lb)    Physical Exam:  Gen: NAD. Vital signs reviewed Eyes: No discharge. EOMI. Cardiovascular: RRR Respiratory: CTA B GI: Soft, non-distended. Abdominal incision with bed of pink granulation which is closing.  Musc: No edema, no tenderness Neurological: She is alert.  Motor: Moves all 4 ext Skin: dry  Psych: language of confusion. impulsive.     Assessment/Plan: 1. Functional, cognitive and behavioral deficits secondary to severe TBI which require 3+ hours per day of interdisciplinary therapy in a comprehensive inpatient rehab setting. Physiatrist is providing close team supervision and 24 hour management of active medical problems listed below. Physiatrist and rehab team continue to assess barriers to discharge/monitor patient progress toward functional and medical goals.  Function:  Bathing Bathing position Bathing activity did not occur: Refused Position: Production manager parts bathed by patient: Right arm, Left arm, Chest, Front perineal area, Buttocks, Right upper leg, Left upper leg, Right lower leg, Left lower leg, Back Body parts bathed by helper: Right arm,  Left arm, Front perineal area, Buttocks, Right upper leg, Left upper leg, Right lower leg, Left lower leg, Back  Bathing assist Assist Level: Supervision or verbal cues      Upper Body Dressing/Undressing Upper body dressing   What is the patient wearing?: Pull over shirt/dress     Pull over shirt/dress - Perfomed by patient: Thread/unthread right sleeve, Thread/unthread left sleeve, Put head through opening, Pull shirt over trunk Pull over shirt/dress - Perfomed by helper: Thread/unthread right sleeve, Thread/unthread left sleeve, Put head through opening, Pull shirt over trunk        Upper body assist Assist Level: Supervision or verbal cues      Lower Body Dressing/Undressing Lower body dressing   What is the patient wearing?: Underwear, Pants, Socks, Shoes Underwear - Performed by patient: Thread/unthread right underwear leg, Thread/unthread left underwear leg, Pull underwear up/down   Pants- Performed by patient: Thread/unthread right pants leg, Thread/unthread left pants leg, Pull pants up/down Pants- Performed by helper: Pull pants up/down Non-skid slipper socks- Performed by patient: Don/doff right sock, Don/doff left sock Non-skid slipper socks- Performed by helper: Don/doff right sock, Don/doff left sock Socks - Performed by patient: Don/doff right sock, Don/doff left sock   Shoes - Performed by patient: Don/doff right shoe, Don/doff left shoe            Lower body assist Assist for lower body dressing: Supervision or verbal cues      Toileting Toileting   Toileting steps completed by patient: Adjust clothing prior to toileting,  Performs perineal hygiene, Adjust clothing after toileting Toileting steps completed by helper: Performs perineal hygiene Toileting Assistive Devices: Grab bar or rail  Toileting assist Assist level: Supervision or verbal cues   Transfers Chair/bed transfer   Chair/bed transfer method: Ambulatory Chair/bed transfer assist level:  Supervision or verbal cues Chair/bed transfer assistive device: Armrests     Locomotion Ambulation     Max distance: 150 ft Assist level: Supervision or verbal cues   Wheelchair   Type: Manual Max wheelchair distance: 110' Assist Level: Supervision or verbal cues  Cognition Comprehension Comprehension assist level: Understands basic 50 - 74% of the time/ requires cueing 25 - 49% of the time  Expression Expression assist level: Expresses basic needs/ideas: With no assist  Social Interaction Social Interaction assist level: Interacts appropriately 25 - 49% of time - Needs frequent redirection.  Problem Solving Problem solving assist level: Solves basic 25 - 49% of the time - needs direction more than half the time to initiate, plan or complete simple activities  Memory Memory assist level: Recognizes or recalls 25 - 49% of the time/requires cueing 50 - 75% of the time   Medical Problem List and Plan: 1. Decreased functional mobility  secondary to TBI/large right occipital epidural hematoma, mildly displaced right temporal bone fracture,Posterior cervical spine ligamentous injury with cervical collar, multiple rib fractures after walking in front of a bus             -continue CIR             -RLAS V+   -still with profound cognitive deficits 2.  DVT Prophylaxis/Anticoagulation: SCDs. Dopplers negative 3. Pain Management: Hycet as needed 4. Mood/agitation related to traumatic brain injury:   -sleep/wake better   -continue risperdal  0.25mg  bid and 2mg  qhs    -changed wellbutrin to celexa 20mg  qhs  -Klonopin  1 mg  BID  - depakote at 500mg  TID--level low---increase to 750mg  BID--may be able to decrease risperdal  -continue nicotine patch  -increased  librium QHS back to  10mg  dose at bedtime  -stopped bromocriptine over concerns of increased agitation 5. Neuropsych: This patient is capable of making decisions on her own behalf.              6. Skin/Wound Care: wound clean and  continues closing  -Surgery signed off for now. abx completed  -continue wet to dry dressings daily 7. Fluids/Electrolytes/Nutrition:   -potassium 4.1      -decr supp to daily  -stool improved    8. Seizure prophylaxis. Keppra 500 mg twice a day 9. Dysphagia with decreased nutritional storage. Clear liquids with honey thick liquids 10. 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   -tolerated decannulation well   11. 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 Acute blood loss anemia. hgb 10.4  hgb 9.9 13. Polysubstance abuse. Provide counseling when appropriate 14. Escherichia coli UTI. Keflex completed  LOS (Days) 21 A FACE TO FACE EVALUATION WAS PERFORMED  Meredith Staggers, MD 10/30/2016 9:22 AM

## 2016-10-30 NOTE — Progress Notes (Signed)
Speech Language Pathology Daily Session Note  Patient Details  Name: Jacqueline [name] MRN: [id number] Date of Birth: 1966-05-28  Today's Date: 10/30/2016 SLP Individual Time: 1005-1100 SLP Individual Time Calculation (min): 55 min  Short Term Goals: Week 3: SLP Short Term Goal 1 (Week 3): Patient will consume trial tray of Dys. 2 textures and demonstrate efficient mastication with compelte oral clearance and without overt s/s of aspiration with Mod A verbal cues over 2 sessions prior to upgrade.  SLP Short Term Goal 2 (Week 3): Patient will demonstrate focused attention to a task for 60 seconds with Max A multimodal cues.  SLP Short Term Goal 3 (Week 3): Patient will orient to place and situation with Mod A multimodal cues.  SLP Short Term Goal 4 (Week 3): Patient will demonstrate basic problem solving for self-care tasks with Max A multimodal cues.  SLP Short Term Goal 5 (Week 3): Patient will identify 1 cognitive and 1 physical deficit with Max A multimodal cues.   Skilled Therapeutic Interventions: Skilled treatment session focused on cognitive goals. SLP facilitated session by initiating external aids to increase orientation to time, place and situation. Patient utilized aids in 30% of opportunities with Max A verbal and visual cues. SLP also facilitated session by providing extra time and Min A verbal cues for problem solving during a basic money management task. Patient left upright in [place] with all needs within reach. Continue with current plan of care.    Function:  Cognition Comprehension Comprehension assist level: Understands basic 50 - 74% of the time/ requires cueing 25 - 49% of the time  Expression   Expression assist level: Expresses basic needs/ideas: With no assist  Social Interaction Social Interaction assist level: Interacts appropriately 25 - 49% of time - Needs frequent redirection.  Problem Solving Problem solving assist level: Solves basic 25 - 49% of the time -  needs direction more than half the time to initiate, plan or complete simple activities  Memory Memory assist level: Recognizes or recalls 25 - 49% of the time/requires cueing 50 - 75% of the time    Pain Pain Assessment Pain Assessment: Faces Faces Pain Scale: No hurt  Therapy/Group: Individual Therapy  Stevens Magwood 10/30/2016, 3:09 PM

## 2016-10-31 ENCOUNTER — Inpatient Hospital Stay (HOSPITAL_COMMUNITY): Payer: Medicaid Other | Admitting: Occupational Therapy

## 2016-10-31 ENCOUNTER — Inpatient Hospital Stay (HOSPITAL_COMMUNITY): Payer: Self-pay

## 2016-10-31 ENCOUNTER — Inpatient Hospital Stay (HOSPITAL_COMMUNITY): Payer: Self-pay | Admitting: Physical Therapy

## 2016-10-31 ENCOUNTER — Inpatient Hospital Stay (HOSPITAL_COMMUNITY): Payer: Medicaid Other | Admitting: Physical Therapy

## 2016-10-31 ENCOUNTER — Inpatient Hospital Stay (HOSPITAL_COMMUNITY): Payer: Medicaid Other | Admitting: Speech Pathology

## 2016-10-31 NOTE — Progress Notes (Signed)
Occupational Therapy Session Note  Patient Details  Name: Jacqueline [name] MRN: [id number] Date of Birth: 26-Feb-1966  Today's Date: 10/31/2016 OT Individual Time: 0945-1100 OT Individual Time Calculation (min): 75 min    Short Term Goals: Week 3:  OT Short Term Goal 1 (Week 3): Pt will demonstrate improved orientation to identify that she is in a hospital with mod verbal cues OT Short Term Goal 2 (Week 3): Pt will initiate dressing tasks with min verbal cues when presented with clothing OT Short Term Goal 3 (Week 3): Pt will attend to bathing tasks for 5 minutes with min verbal cues   Skilled Therapeutic Interventions/Progress Updates:    Pt seen this session to facilitate attention and orientation. Pt received in gerichair at nurses station, pt fully alert and writing notes to the doctor to allow her to go home.  Pt taken back to room to pick out new clothing to wear today. She did look through her belongings and chose some clothes with mod cues to refocus.  Pt presented with new hair care supplies to use for her shower. Pt convinced in was nighttime and that she would shower in the morning. Attempted to reorient pt with sunlight coming through the window and the clock. Pt was perseverating on going home and that is was night. She wanted to wait to don her new clothing until the "morning".  Pt agreeable to going to other room to play a game.   A pipe tree puzzle was sitting out and pt stated that she loves to put things together.  Engaged in 3 different pipe tree designs for a total of 45 minutes. Pt's attention to the puzzle fluctuated from 30 sec to 5 minutes.  She frequently repeated herself, "I was born in [birth year]. We are in [place].  I plan to leave today. It is 2012."  Or other various statements with confusion about where she was, when it was. With cues, pt identified the month and did recall the president. Pt taken back to w/c in nursing station for supervision. Sat in gerichair with quick  release belt and lap tray in place with her writing board.  Therapy Documentation Precautions:  Precautions Precautions: Cervical, Fall, Other (comment) Precaution Comments: abdominal incision Required Braces or Orthoses: Cervical Brace Cervical Brace: Hard collar, At all times Restrictions Weight Bearing Restrictions: No    Vital Signs: Therapy Vitals Temp: 98.5 F (36.9 C) Temp Source: Oral Pulse Rate: 90 Resp: 18 BP: (!) 102/58 Patient Position (if appropriate): Sitting Oxygen Therapy SpO2: 98 % O2 Device: Not Delivered Pain: Pain Assessment Pain Assessment: Faces Faces Pain Scale: No hurt ADL: ADL ADL Comments: refer to functional navigator  See Function Navigator for Current Functional Status.   Therapy/Group: Individual Therapy  Braxtyn Dorff 10/31/2016, 1:17 PM

## 2016-10-31 NOTE — Progress Notes (Signed)
Physical Therapy Weekly Progress Note  Patient Details  Name: Jacqueline [name] MRN: [id number] Date of Birth: 1966-03-03  Beginning of progress report period: October 24, 2016 End of progress report period: October 31, 2016  Today's Date: 10/31/2016  Patient has met 1 of 3 short term goals.  Pt is making fair progress towards long term goals. Pt has progressed to ambulating with supervision without AD. Pt demonstrates improved sustained attention up to ~15 minutes in a controlled environment. Pt continues to be limited by impaired safety awareness and problem solving. Pt continues to require max cuing for orientation with poor carryover during sessions. Pt would benefit from continued skilled PT treatment to focus on balance, cognitive remediation, safety awareness, gait, strengthening and endurance training.   Patient continues to demonstrate the following deficits muscle weakness, decreased cardiorespiratoy endurance, decreased coordination, decreased attention, decreased awareness, decreased problem solving, decreased safety awareness, decreased memory and delayed processing, and decreased standing balance, decreased postural control, decreased balance strategies and difficulty maintaining precautions and therefore will continue to benefit from skilled PT intervention to increase functional independence with mobility.  Patient progressing toward long term goals..  Continue plan of care.  PT Short Term Goals Week 3:  PT Short Term Goal 1 (Week 3): Pt will consistently ambulate with supervision assist. PT Short Term Goal 1 - Progress (Week 3): Met (Pt is able to ambulate with supervision for past 2 days.) PT Short Term Goal 2 (Week 3): Pt will negotiate 12 steps with B rails and min assist. (Pt has negotiated 8 steps with B rails & min assist.) PT Short Term Goal 2 - Progress (Week 3): Progressing toward goal PT Short Term Goal 3 (Week 3): Pt will be oriented to place with min cuing. PT Short  Term Goal 3 - Progress (Week 3): Not met Week 4:  PT Short Term Goal 1 (Week 4): Pt will be oriented to location with min cuing. PT Short Term Goal 2 (Week 4): Pt will negotiate 12 steps with supervision for strengthening with PT. PT Short Term Goal 3 (Week 4): Pt will demonstrate intellectual awareness with moderate cuing.   Therapy Documentation Precautions:  Precautions Precautions: Cervical, Fall, Other (comment) Precaution Comments: abdominal incision Required Braces or Orthoses: Cervical Brace Cervical Brace: Hard collar, At all times Restrictions Weight Bearing Restrictions: No   [name] 10/31/2016, 7:51 AM

## 2016-10-31 NOTE — Progress Notes (Signed)
Speech Language Pathology Weekly Progress and Session Note  Patient Details  Name: Jacqueline [name] MRN: [id number] Date of Birth: [birth year]  Beginning of progress report period: October 24, 2016 End of progress report period: October 31, 2016  Today's Date: 10/31/2016 SLP Individual Time: 1330-1400 SLP Individual Time Calculation (min): 30 min  Short Term Goals: Week 3: SLP Short Term Goal 1 (Week 3): Patient will consume trial tray of Dys. 2 textures and demonstrate efficient mastication with compelte oral clearance and without overt s/s of aspiration with Mod A verbal cues over 2 sessions prior to upgrade.  SLP Short Term Goal 1 - Progress (Week 3): Met SLP Short Term Goal 2 (Week 3): Patient will demonstrate focused attention to a task for 60 seconds with Max A multimodal cues.  SLP Short Term Goal 2 - Progress (Week 3): Met SLP Short Term Goal 3 (Week 3): Patient will orient to place and situation with Mod A multimodal cues.  SLP Short Term Goal 3 - Progress (Week 3): Not met SLP Short Term Goal 4 (Week 3): Patient will demonstrate basic problem solving for self-care tasks with Max A multimodal cues.  SLP Short Term Goal 4 - Progress (Week 3): Met SLP Short Term Goal 5 (Week 3): Patient will identify 1 cognitive and 1 physical deficit with Max A multimodal cues.  SLP Short Term Goal 5 - Progress (Week 3): Not met    New Short Term Goals: Week 4: SLP Short Term Goal 1 (Week 4): Patient will identify 1 cognitive and 1 physical deficit with Max A multimodal cues.  SLP Short Term Goal 2 (Week 4): Patient will demonstrate basic problem solving for self-care tasks with Mod A multimodal cues.  SLP Short Term Goal 3 (Week 4): Patient will orient to place and situation with Mod A multimodal cues.  SLP Short Term Goal 4 (Week 4): Patient will demonstrate sustained attention to a task for 5 minutes with Max A multimodal cues.  SLP Short Term Goal 5 (Week 4): Patient will consume trials of  Dys. 3 textures and demonstrate efficient mastication with complete oral clearance and without overt s/s of aspiration with Mod A verbal cues over 2 sessions prior to upgrade.   Weekly Progress Updates: Patient has made functional gains and has met 3 of 5 STG's this reporting period. Currently, patient is demonstrating behaviors consistent with a [place]o Level V-emerging VI and requires overall Max-Total A to complete functional and familiar tasks safely in regards functional problem solving, sustained attention, recall of new information, and intellectual awareness. Patient continues to demonstrate verbosity with language of confusion and perseveration. Patient is currently consuming Dys. 2 textures with thin liquids with minimal overt s/s of aspiration and requires Min-Mod A verbal cues for use of swallowing compensatory strategies. SLP has been facilitating ongoing trials of upgraded textures but patient continues to demonstrate intermittent decreased awareness to bolus with decreased oral clearance. Patient and family education is ongoing. Patient would benefit from continued skilled SLP intervention to maximize her cognitive and swallowing function and overall functional independence prior to discharge.     Intensity: Minumum of 1-2 x/day, 30 to 90 minutes Frequency: 3 to 5 out of 7 days Duration/Length of Stay: TBD due to SNF placement  Treatment/Interventions: Cognitive remediation/compensation;Cueing hierarchy;Functional tasks;Patient/family education;Therapeutic Activities;Internal/external aids;Dysphagia/aspiration precaution training;Environmental controls   Daily Session  Skilled Therapeutic Interventions: Skilled treatment session focused on cognitive and dysphagia goals. SLP facilitated session by providing total A for orientation to place, time and situation.  Patient unable to recall information after a 30 second delayed and required constant reorientation throughout the session. SLP also  facilitated session by administering trials of Dys. 3 textures. Patient demonstrated efficient mastication with minimal oral residue with overt cough X 1, suspect due to talking with a full oral cavity and decreased awareness of bolus. Recommend continued trials with SLP only. Patient left upright in [place] at [employer] Continue with current plan of care.       Function:   Eating Eating   Modified Consistency Diet: Yes Eating Assist Level: Set up assist for;More than reasonable amount of time;Supervision or verbal cues   Eating Set Up Assist For: Opening containers       Cognition Comprehension Comprehension assist level: Understands basic 50 - 74% of the time/ requires cueing 25 - 49% of the time  Expression   Expression assist level: Expresses basic needs/ideas: With no assist  Social Interaction Social Interaction assist level: Interacts appropriately 25 - 49% of time - Needs frequent redirection.  Problem Solving Problem solving assist level: Solves basic 25 - 49% of the time - needs direction more than half the time to initiate, plan or complete simple activities  Memory Memory assist level: Recognizes or recalls 25 - 49% of the time/requires cueing 50 - 75% of the time   Pain Pain Assessment Pain Assessment: Faces Faces Pain Scale: No hurt  Therapy/Group: Individual Therapy  Skylyn Slezak, [place] 10/31/2016, 3:45 PM

## 2016-10-31 NOTE — Progress Notes (Addendum)
Physical Therapy Session Note  Patient Details  Name: Jacqueline [name] MRN: [id number] Date of Birth: 29-Nov-1965  Today's Date: 10/31/2016 PT Individual Time: 1106-1200 PT Individual Time Calculation (min): 54 min   Short Term Goals: Week 4:  PT Short Term Goal 1 (Week 4): Pt will be oriented to location with min cuing. PT Short Term Goal 2 (Week 4): Pt will negotiate 12 steps with supervision for strengthening with PT. PT Short Term Goal 3 (Week 4): Pt will demonstrate intellectual awareness with moderate cuing.  Skilled Therapeutic Interventions/Progress Updates:  Pt received in recliner. Session focused on cognitive remediation and functional mobility. Pt ambulated throughout unit without AD device & supervision for balance. Pt completed car transfer from sedan simulated height with supervision and utilized nu-step on level 2 x 12 minutes with BLE only for endurance training & attention to task. Pt required max cuing for expected length of time on device. Pt assembled puzzle with max cuing for problem solving but only one instance of cuing for sustained attention to task. Pt able to sustain attention to task up to 25 minutes in a controlled environment. Pt perseverating on going home and speaking with the MD throughout the entire session and required frequent maximum cuing for redirection. At end of session pt left sitting in geri-chair with lap tray & QRB donned at nurses station.   Pt able to recall that she is in [place] only 2 times during session but this is an improvement as compared to last week.    Addendum: Pt assisted with moving chairs in [place] but with poor safety awareness & required min assist for balance.  Therapy Documentation Precautions:  Precautions Precautions: Cervical, Fall, Other (comment) Precaution Comments: abdominal incision Required Braces or Orthoses: Cervical Brace Cervical Brace: Hard collar, At all times Restrictions Weight Bearing Restrictions:  No  Pain: Pain Assessment Pain Assessment: Faces Faces Pain Scale: No hurt   See Function Navigator for Current Functional Status.   Therapy/Group: Individual Therapy  [name] 10/31/2016, 12:25 PM
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
## 2016-10-31 NOTE — Progress Notes (Signed)
Occupational Therapy Note  Patient Details  Name: Jacqueline [name] MRN: [id number] Date of Birth: 11/30/1965  Today's Date: 10/31/2016 OT Individual Time: 1300-1330 OT Individual Time Calculation (min): 30 min   Pt c/o abdominal pain; RN aware Individual Therapy  Pt resting in gerichair upon arrival with brother present.  Pt continues to perseverate on going home but accepts that MD must approve.  Focus on functinal amb in unit to locate MD.  Pt required mod verbal cues to remember who she was looking for.  Pt stated that she "knew" she could sign herself out of hospital without MD approval.  Pt easily redirected to alternative topic.  Pt returned to gerichair with QRB and lap tray in place and returned to nursing station.    [name] [facility] 10/31/2016, 1:52 PM

## 2016-10-31 NOTE — Plan of Care (Signed)
Problem: RH Dressing Goal: LTG Patient will perform lower body dressing w/assist (OT) LTG: Patient will perform lower body dressing with assist, with/without cues in positioning using equipment (OT)  Upgraded due to progress  Problem: RH Toileting Goal: LTG Patient will perform toileting w/assist, cues/equip (OT) LTG: Patient will perform toiletiing (clothes management/hygiene) with assist, with/without cues using equipment (OT)  Upgraded due to progress  Problem: RH Toilet Transfers Goal: LTG Patient will perform toilet transfers w/assist (OT) LTG: Patient will perform toilet transfers with assist, with/without cues using equipment (OT)  Upgraded due to progress

## 2016-10-31 NOTE — Progress Notes (Signed)
Occupational Therapy Session Note  Patient Details  Name: Jacqueline [name] MRN: [id number] Date of Birth: [birth year]  Today's Date: 10/31/2016 OT Individual Time: 0700-0800 OT Individual Time Calculation (min): 60 min    Short Term Goals: Week 2:  OT Short Term Goal 1 (Week 2): Pt will demonstrate improved praxis by using deoderant in the correct manner with min verbal cues OT Short Term Goal 1 - Progress (Week 2): Met OT Short Term Goal 2 (Week 2): Pt will attend to self care task for at least 3 minutes without redirection.  OT Short Term Goal 2 - Progress (Week 2): Met OT Short Term Goal 3 (Week 2): Pt will demonstrate improved orientation to identify that she is in a hospital with mod verbal cues OT Short Term Goal 3 - Progress (Week 2): Progressing toward goal Week 3:  OT Short Term Goal 1 (Week 3): Pt will demonstrate improved orientation to identify that she is in a hospital with mod verbal cues OT Short Term Goal 2 (Week 3): Pt will initiate dressing tasks with min verbal cues when presented with clothing OT Short Term Goal 3 (Week 3): Pt will attend to bathing tasks for 5 minutes with min verbal cues   Skilled Therapeutic Interventions/Progress Updates:    Pt standing in room upon arrival with RN present.  Pt initially engaged in eating breakfast with max verbal cues for redirection to task.  Pt continually picked up all condiments to inspect.  Pt frequently mixed various food items before eating and at one time placed yogurt in coffee.  Pt stated her coffee didn't taste good and therapist reminded pt that she placed yogurt in coffee.  Pt repeated this task X 3 with same results.  Pt required tot A for orientation to place, situation, date, and her age.  Pt required use of toilet and amb to bathroom at supervision level.  Pt returned to gerichair with QRB and lap tray in place.  Pt placed at nursing station, awaiting her next therapy.   Therapy Documentation Precautions:   Precautions Precautions: Cervical, Fall, Other (comment) Precaution Comments: abdominal incision Required Braces or Orthoses: Cervical Brace Cervical Brace: Hard collar, At all times Restrictions Weight Bearing Restrictions: No Pain: Pt denied pain    See Function Navigator for Current Functional Status.   Therapy/Group: Individual Therapy  Leroy Libman 10/31/2016, 8:02 AM

## 2016-10-31 NOTE — Progress Notes (Signed)
Today's trial with unzipped enclosure bed was not successful. Patient began to escalate in behavior at 1530 at the nurses station. She began to request to "help" in answering the phone and getting on the computer and each time was told no and explained why she could not. She told staff that she could because she worked here and we attempted to bring her back to reality multiple times. At this point she became louder so this RN made the decision to put her back to bed to decrease her stimulation and allow her time to deescalated.  Her second opportunity at 1700 she also became escalated with the same behaviors. She kept insisting on answering the phone, and working on the computer because she worked here. Again she became louder and louder. We again returned her to her enclosure bed and zipped her up. She again needed time to deescalate. We will attempt another trial 11/01/16,

## 2016-10-31 NOTE — Progress Notes (Signed)
Physical Therapy Session Note  Patient Details  Name: Jacqueline [name] MRN: [id number] Date of Birth: 1966-01-12  Today's Date: 10/31/2016 PT Individual Time: 0850-0920 PT Individual Time Calculation (min): 30 min   Short Term Goals: Week 4:  PT Short Term Goal 1 (Week 4): Pt will be oriented to location with min cuing. PT Short Term Goal 2 (Week 4): Pt will negotiate 12 steps with supervision for strengthening with PT. PT Short Term Goal 3 (Week 4): Pt will demonstrate intellectual awareness with moderate cuing.  Skilled Therapeutic Interventions/Progress Updates:  Treatment focused on dual tasking to improve attention and function during ambulation.  Pt ambulated around unit looking for her physician to ask whether or not she could leave the hospital.  Pt very perseverative on leaving on hospital.  Worked in kitchen looking for items to stir with and make coffee with which pt was able to find with minimal cues.  Pt able to transfer sit to stand from sofa with supervision.  Additionally, pt toileted and required supervision only for handwashing and peri-care.  Pt left at nursing station in cardiac chair with lap belt in place.    Therapy Documentation Precautions:  Precautions Precautions: Cervical, Fall, Other (comment) Precaution Comments: abdominal incision Required Braces or Orthoses: Cervical Brace Cervical Brace: Hard collar, At all times Restrictions Weight Bearing Restrictions: No  See Function Navigator for Current Functional Status.   Therapy/Group: Individual Therapy  Rafael Quesada Hilario Quarry 10/31/2016, 11:06 AM

## 2016-11-01 ENCOUNTER — Inpatient Hospital Stay (HOSPITAL_COMMUNITY): Payer: Self-pay | Admitting: Physical Therapy

## 2016-11-01 ENCOUNTER — Inpatient Hospital Stay (HOSPITAL_COMMUNITY): Payer: Self-pay | Admitting: Occupational Therapy

## 2016-11-01 ENCOUNTER — Inpatient Hospital Stay (HOSPITAL_COMMUNITY): Payer: Self-pay

## 2016-11-01 ENCOUNTER — Inpatient Hospital Stay (HOSPITAL_COMMUNITY): Payer: Medicaid Other | Admitting: Speech Pathology

## 2016-11-01 MED ORDER — PANTOPRAZOLE SODIUM 40 MG PO TBEC
40.0000 mg | DELAYED_RELEASE_TABLET | Freq: Every day | ORAL | Status: DC
  Administered 2016-11-02 – 2016-12-12 (×41): 40 mg via ORAL
  Filled 2016-11-01 (×43): qty 1

## 2016-11-01 MED ORDER — VALPROIC ACID 250 MG PO CAPS
750.0000 mg | ORAL_CAPSULE | Freq: Three times a day (TID) | ORAL | Status: DC
  Administered 2016-11-01 – 2016-11-14 (×38): 750 mg via ORAL
  Filled 2016-11-01 (×38): qty 3

## 2016-11-01 MED ORDER — POTASSIUM CHLORIDE CRYS ER 20 MEQ PO TBCR
20.0000 meq | EXTENDED_RELEASE_TABLET | Freq: Every day | ORAL | Status: DC
  Administered 2016-11-02 – 2016-12-12 (×41): 20 meq via ORAL
  Filled 2016-11-01 (×41): qty 1

## 2016-11-01 MED ORDER — LEVETIRACETAM 500 MG PO TABS
500.0000 mg | ORAL_TABLET | Freq: Two times a day (BID) | ORAL | Status: DC
  Administered 2016-11-01 – 2016-11-06 (×9): 500 mg via ORAL
  Filled 2016-11-01 (×10): qty 1
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
## 2016-11-01 NOTE — Progress Notes (Signed)
Patient remained out of bed from 0800-1830. Writer and staff were able to redirect the patient when she became agitated or saying that she was going to leave AMA. Around 1830, patient became insistent that she be allowed to go home and attempted to pack her belongings with intent of leaving. At this time patient was placed in the veil bed and it was secured. Plan continues to be to keep patient out of the veil bed as long as she is safe. She was allowed to make two phone calls today, one at 1000 and one at 1500. Suggest that this be continued as it appeared to reduce her anxiety level. When out of bed she requires almost constant redirection. She did sit behind the nursing desk for about two hours during the day, the activity appeared to help keep her occupied. Patient has significant problems with her short term memory. Our goal is to limit or discontinue use of the veil bed thru use of redirection, reminding, and reassurance. Writer will speak with the Medical Team about possible use of an antianxiety medication other than the currently prescribed Ativan which appeared to have a paradoxical effect the last time it was given. Patient became more agitated rather than less.

## 2016-11-01 NOTE — Progress Notes (Signed)
Speech Language Pathology Daily Session Note  Patient Details  Name: Jacqueline [name] MRN: [id number] Date of Birth: 09-14-1966  Today's Date: 11/01/2016 SLP Individual Time: 1000-1055 SLP Individual Time Calculation (min): 55 min  Short Term Goals: Week 4: SLP Short Term Goal 1 (Week 4): Patient will identify 1 cognitive and 1 physical deficit with Max A multimodal cues.  SLP Short Term Goal 2 (Week 4): Patient will demonstrate basic problem solving for self-care tasks with Mod A multimodal cues.  SLP Short Term Goal 3 (Week 4): Patient will orient to place and situation with Mod A multimodal cues.  SLP Short Term Goal 4 (Week 4): Patient will demonstrate sustained attention to a task for 5 minutes with Max A multimodal cues.  SLP Short Term Goal 5 (Week 4): Patient will consume trials of Dys. 3 textures and demonstrate efficient mastication with complete oral clearance and without overt s/s of aspiration with Mod A verbal cues over 2 sessions prior to upgrade.   Skilled Therapeutic Interventions:  Skilled treatment session focused on dysphagia and cognitive goals. SLP facilitated session by administering trials of Dys. 3 textures.  Patient demonstrated efficient mastication with complete oral clearance without overt s/s of aspiration. Recommend trial tray prior to upgrade. Patient required intermittent Max A multimodal cues for orientation to time, place and situation and for sustained attention to task for ~2 minutes. Patient perseverative on going home and required total A for intellectual awareness of both physical and cognitive deficits. Patient left upright at RN station with all needs within reach. Continue with current plan of care.    Function:  Eating Eating   Modified Consistency Diet: Yes Eating Assist Level: More than reasonable amount of time;Supervision or verbal cues           Cognition Comprehension Comprehension assist level: Understands basic 50 - 74% of the time/  requires cueing 25 - 49% of the time  Expression   Expression assist level: Expresses basic needs/ideas: With no assist  Social Interaction Social Interaction assist level: Interacts appropriately 25 - 49% of time - Needs frequent redirection.  Problem Solving Problem solving assist level: Solves basic 25 - 49% of the time - needs direction more than half the time to initiate, plan or complete simple activities  Memory Memory assist level: Recognizes or recalls 25 - 49% of the time/requires cueing 50 - 75% of the time    Pain No/Denies Pain    Therapy/Group: Individual Therapy  Jadira Nierman 11/01/2016, 3:39 PM

## 2016-11-01 NOTE — Progress Notes (Signed)
Physical Therapy Note  Patient Details  Name: DARIELLA COLUMBO MRN: [id number] Date of Birth: 1966/09/27 Today's Date: 11/01/2016    Time: [id number] 55 minutes  1:1 No c/o pain. Session focused on attention to task, awareness and orientation during therapeutic activities.  Pt performed gait throughout unit with supervision, pt able to self correct LOB during functional task of moving chairs in day room.  Nu step x 10 minutes with pt able to perform with min cuing to continue task and attend to number of steps completed.  Pt performed puzzle with max A to complete puzzle, able to attend to task x 15 minutes in quiet environment.  Pt perseverating on going home throughout session. Pt improving insight and reasoning skills eg:"If you are getting lots of admissions on this unit, I should discharge so you can have an open bed".  Pt improving orientation to place, still disoriented to situation.   Demosthenes Virnig 11/01/2016, 9:24 AM

## 2016-11-01 NOTE — Progress Notes (Signed)
Occupational Therapy Session Note  Patient Details  Name: Jacqueline [name] MRN: [id number] Date of Birth: April 13, 1966  Today's Date: 11/01/2016 OT Individual Time: 1515-1600 OT Individual Time Calculation (min): 45 min    Short Term Goals: Week 1:  OT Short Term Goal 1 (Week 1): Pt will demonstrate improved praxis by using deoderant in the correct manner. OT Short Term Goal 1 - Progress (Week 1): Progressing toward goal OT Short Term Goal 2 (Week 1): Pt will demonstrate improved sitting balance EOB with S while engaging in UB self care. OT Short Term Goal 2 - Progress (Week 1): Met OT Short Term Goal 3 (Week 1): Pt will demonstrate improved standing balance of min A while engaging in LB self care. OT Short Term Goal 3 - Progress (Week 1): Met OT Short Term Goal 4 (Week 1): Pt will attend to self care task for at least 3 minutes without redirection.  OT Short Term Goal 4 - Progress (Week 1): Progressing toward goal OT Short Term Goal 5 (Week 1): Pt will demonstrate improved orientation to identify that she is in a hospital. OT Short Term Goal 5 - Progress (Week 1): Progressing toward goal Week 2:  OT Short Term Goal 1 (Week 2): Pt will demonstrate improved praxis by using deoderant in the correct manner with min verbal cues OT Short Term Goal 1 - Progress (Week 2): Met OT Short Term Goal 2 (Week 2): Pt will attend to self care task for at least 3 minutes without redirection.  OT Short Term Goal 2 - Progress (Week 2): Met OT Short Term Goal 3 (Week 2): Pt will demonstrate improved orientation to identify that she is in a hospital with mod verbal cues OT Short Term Goal 3 - Progress (Week 2): Progressing toward goal Week 3:  OT Short Term Goal 1 (Week 3): Pt will demonstrate improved orientation to identify that she is in a hospital with mod verbal cues OT Short Term Goal 2 (Week 3): Pt will initiate dressing tasks with min verbal cues when presented with clothing OT Short Term Goal 3 (Week  3): Pt will attend to bathing tasks for 5 minutes with min verbal cues   Skilled Therapeutic Interventions/Progress Updates:  No signs or symptoms of pain during this session. Upon entering room, pt found seated in chair with two daughter present. Pt willing to work with therapist. Pt stood and ambulated to brain injury room. In brain injury room engaged pt in therapeutic activity focusing on orientation, attention, and overall awareness. Pt completed a puzzle where she had to follow a certain pattern. First puzzle, pt required max cueing for accuracy. Pt completed 3 puzzles altogether and by the last puzzle, pt only required questioning cues for accuracy. This therapist put one puzzle together incorrectly and pt was able to identify the errors of the puzzle. Pt oriented to self only during session. Towards end of session, assisted pt back to her room. Pt left seated in chair with female visitor present.   Therapy Documentation Precautions:  Precautions Precautions: Cervical, Fall, Other (comment) Precaution Comments: abdominal incision Required Braces or Orthoses: Cervical Brace Cervical Brace: Hard collar, At all times Restrictions Weight Bearing Restrictions: No  Vital Signs: Therapy Vitals Temp: 97.8 F (36.6 C) Temp Source: Tympanic Pulse Rate: 98 Resp: 16 BP: 108/68 Patient Position (if appropriate): Standing Oxygen Therapy SpO2: 98 % O2 Device: Not Delivered  See Function Navigator for Current Functional Status.   Therapy/Group: Individual Therapy  Chrys Racer , MS,  OTR/L, CLT  11/01/2016, 5:37 PM

## 2016-11-01 NOTE — Progress Notes (Signed)
Physical Therapy Session Note  Patient Details  Name: Jacqueline [name] MRN: [id number] Date of Birth: 04/26/1966  Today's Date: 11/01/2016 PT Individual Time: 1415-1500 PT Individual Time Calculation (min): 45 min   Short Term Goals: Week 4:  PT Short Term Goal 1 (Week 4): Pt will be oriented to location with min cuing. PT Short Term Goal 2 (Week 4): Pt will negotiate 12 steps with supervision for strengthening with PT. PT Short Term Goal 3 (Week 4): Pt will demonstrate intellectual awareness with moderate cuing.  Skilled Therapeutic Interventions/Progress Updates:   Pt received standing in room organizing contents in drawer. Session focused on sustained and selective attention. Gait around unit with supervision assist from PT to gather playing cards from rehab gym. One near LOB with Pt able to self correct. PT instructed playing multiple card games in semi distracting environment with moderate redirects from PT back to card game Patient able to be redirected to card game for selective attention of up to 45 seconds at a time for  25 minutes. Daughters present for last 10 minutes of treatment with patient demonstrated decreased ability to be re-directed to task and poor awareness being unable to appropriately identify  either daughter. Pt returned to room and left sitting with daughters present.      Therapy Documentation Precautions:  Precautions Precautions: Cervical, Fall, Other (comment) Precaution Comments: abdominal incision Required Braces or Orthoses: Cervical Brace Cervical Brace: Hard collar, At all times Restrictions Weight Bearing Restrictions: No Vital Signs: Therapy Vitals Temp: 97.8 F (36.6 C) Temp Source: Tympanic Pulse Rate: 98 Resp: 16 BP: 108/68 Patient Position (if appropriate): Standing Oxygen Therapy SpO2: 98 % O2 Device: Not Delivered   See Function Navigator for Current Functional Status.   Therapy/Group: Individual Therapy  Jacqueline [name] 11/01/2016, 4:04 PM

## 2016-11-01 NOTE — Progress Notes (Signed)
[place] PHYSICAL MEDICINE & REHABILITATION     PROGRESS NOTE    Subjective/Complaints: Tried enclosure bed unzipped and patient became more agitated, attempting to leave room, etc as described by Therapist, sports. Pt pleasant and calm this AM  ROS: Limited due cognitive/behavioral      Objective: Vital Signs: Blood pressure 106/65, pulse (!) 101, temperature 97.6 F (36.4 C), temperature source Oral, resp. rate 18, weight 81.2 kg (179 lb), last menstrual period 10/09/2016, SpO2 98 %. No results found.  Recent Labs  10/29/16 1108  WBC 10.3  HGB 10.2*  HCT 32.5*  PLT 488*    Recent Labs  10/29/16 1108  NA 137  K 4.1  CL 100*  GLUCOSE 81  BUN <5*  CREATININE 0.75  CALCIUM 8.3*   CBG (last 3)  No results for input(s): GLUCAP in the last 72 hours.  Wt Readings from Last 3 Encounters:  11/01/16 81.2 kg (179 lb)  10/09/16 71.4 kg (157 lb 4.8 oz)  08/03/16 93 kg (205 lb)    Physical Exam:  Gen: NAD. Vital signs reviewed Eyes: No discharge. EOMI. Cardiovascular: RRR Respiratory: CTA B GI: Soft, non-distended. Abdominal incision with bed of pink granulation which is shrinking  Musc: No edema, no tenderness Neurological: She is alert.  Motor: Moves all 4 ext Skin: dry  Psych: confused, confabulates  Assessment/Plan: 1. Functional, cognitive and behavioral deficits secondary to severe TBI which require 3+ hours per day of interdisciplinary therapy in a comprehensive inpatient rehab setting. Physiatrist is providing close team supervision and 24 hour management of active medical problems listed below. Physiatrist and rehab team continue to assess barriers to discharge/monitor patient progress toward functional and medical goals.  Function:  Bathing Bathing position Bathing activity did not occur: Refused Position: Production manager parts bathed by patient: Right arm, Left arm, Chest, Front perineal area, Buttocks, Right upper leg, Left upper leg, Right lower  leg, Left lower leg, Back Body parts bathed by helper: Right arm, Left arm, Front perineal area, Buttocks, Right upper leg, Left upper leg, Right lower leg, Left lower leg, Back  Bathing assist Assist Level: Supervision or verbal cues      Upper Body Dressing/Undressing Upper body dressing   What is the patient wearing?: Pull over shirt/dress     Pull over shirt/dress - Perfomed by patient: Thread/unthread right sleeve, Thread/unthread left sleeve, Put head through opening, Pull shirt over trunk Pull over shirt/dress - Perfomed by helper: Thread/unthread right sleeve, Thread/unthread left sleeve, Put head through opening, Pull shirt over trunk        Upper body assist Assist Level: Supervision or verbal cues      Lower Body Dressing/Undressing Lower body dressing   What is the patient wearing?: Underwear, Pants, Socks, Shoes Underwear - Performed by patient: Thread/unthread right underwear leg, Thread/unthread left underwear leg, Pull underwear up/down   Pants- Performed by patient: Thread/unthread right pants leg, Thread/unthread left pants leg, Pull pants up/down Pants- Performed by helper: Pull pants up/down Non-skid slipper socks- Performed by patient: Don/doff right sock, Don/doff left sock Non-skid slipper socks- Performed by helper: Don/doff right sock, Don/doff left sock Socks - Performed by patient: Don/doff right sock, Don/doff left sock   Shoes - Performed by patient: Don/doff right shoe, Don/doff left shoe            Lower body assist Assist for lower body dressing: Supervision or verbal cues      Toileting Toileting   Toileting steps completed by patient:  Adjust clothing prior to toileting, Performs perineal hygiene, Adjust clothing after toileting Toileting steps completed by helper: Performs perineal hygiene Toileting Assistive Devices: Grab bar or rail  Toileting assist Assist level: Supervision or verbal cues   Transfers Chair/bed transfer   Chair/bed  transfer method: Ambulatory Chair/bed transfer assist level: Supervision or verbal cues Chair/bed transfer assistive device: Armrests     Locomotion Ambulation     Max distance: >150 ft Assist level: Supervision or verbal cues   Wheelchair   Type: Manual Max wheelchair distance: 110' Assist Level: Supervision or verbal cues  Cognition Comprehension Comprehension assist level: Understands basic 50 - 74% of the time/ requires cueing 25 - 49% of the time  Expression Expression assist level: Expresses basic needs/ideas: With no assist  Social Interaction Social Interaction assist level: Interacts appropriately 25 - 49% of time - Needs frequent redirection.  Problem Solving Problem solving assist level: Solves basic 25 - 49% of the time - needs direction more than half the time to initiate, plan or complete simple activities  Memory Memory assist level: Recognizes or recalls 25 - 49% of the time/requires cueing 50 - 75% of the time   Medical Problem List and Plan: 1. Decreased functional mobility  secondary to TBI/large right occipital epidural hematoma, mildly displaced right temporal bone fracture,Posterior cervical spine ligamentous injury with cervical collar, multiple rib fractures after walking in front of a bus             -continue CIR             -RLAS V+   -still with profound cognitive deficits 2.  DVT Prophylaxis/Anticoagulation: SCDs. Dopplers negative 3. Pain Management: Hycet as needed 4. Mood/agitation related to traumatic brain injury:   -sleep/wake better   -continue risperdal at 2mg  qhs    -changed wellbutrin to celexa 20mg  qhs  -Klonopin  1 mg  BID  - depakote  increased to 750mg  TID  -continue nicotine patch  -increased  librium QHS back to  10mg  dose at bedtime  -consider resuming day time risperdal 5. Neuropsych: This patient is capable of making decisions on her own behalf.             -liberate from enclosure bed soon---can unzip 6. Skin/Wound Care: wound  clean and continues closing  -Surgery signed off for now. abx completed  -continue wet to dry dressings daily 7. Fluids/Electrolytes/Nutrition:   -potassium 4.1      -decr supp to daily  -stool improved    8. Seizure prophylaxis. Keppra 500 mg twice a day  9. Dysphagia with decreased nutritional storage. Clear liquids with honey thick liquids 10. 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   -tolerated decannulation well   11. 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 Acute blood loss anemia. hgb 10.4  hgb 9.9 13. Polysubstance abuse. Provide counseling when appropriate 14. Escherichia coli UTI. Keflex completed  LOS (Days) 23 A FACE TO FACE EVALUATION WAS PERFORMED  Meredith Staggers, MD 11/01/2016 9:11 AM

## 2016-11-02 ENCOUNTER — Inpatient Hospital Stay (HOSPITAL_COMMUNITY): Payer: Self-pay | Admitting: Physical Therapy

## 2016-11-02 ENCOUNTER — Inpatient Hospital Stay (HOSPITAL_COMMUNITY): Payer: Medicaid Other | Admitting: Speech Pathology

## 2016-11-02 ENCOUNTER — Inpatient Hospital Stay (HOSPITAL_COMMUNITY): Payer: Self-pay

## 2016-11-02 MED ORDER — RISPERIDONE 1 MG PO TABS
0.5000 mg | ORAL_TABLET | Freq: Two times a day (BID) | ORAL | Status: DC
  Administered 2016-11-02 – 2016-11-08 (×12): 0.5 mg via ORAL
  Filled 2016-11-02 (×12): qty 1

## 2016-11-02 NOTE — Progress Notes (Signed)
Occupational Therapy Weekly Progress Note  Patient Details  Name: Jacqueline [name] MRN: [id number] Date of Birth: 06-25-1966  Beginning of progress report period: October 26, 2016 End of progress report period: November 02, 2016   Patient has met 0 of 3 short term goals.  Pt progress has been minimal this past week.  Pt continues to require tot A for orientation, max A for task initiation and sequencing, and max A for safety awareness.  Pt regularly declines bathing/dressing tasks, stating that she just completed that task.  Pt requires max verbal cues for redirection to task and recall of information.  Pt exhibits behaviors consistent with [place] Level V.  Patient continues to demonstrate the following deficits: muscle weakness, decreased attention, decreased awareness, decreased problem solving, decreased safety awareness, decreased memory and delayed processing and decreased balance strategies and difficulty maintaining precautions and therefore will continue to benefit from skilled OT intervention to enhance overall performance with BADL, iADL, Vocation and Reduce care partner burden.  Patient progressing toward long term goals..  Continue plan of care.  OT Short Term Goals Week 3:  OT Short Term Goal 1 (Week 3): Pt will demonstrate improved orientation to identify that she is in a hospital with mod verbal cues OT Short Term Goal 1 - Progress (Week 3): Progressing toward goal OT Short Term Goal 2 (Week 3): Pt will initiate dressing tasks with min verbal cues when presented with clothing OT Short Term Goal 2 - Progress (Week 3): Progressing toward goal OT Short Term Goal 3 (Week 3): Pt will attend to bathing tasks for 5 minutes with min verbal cues  OT Short Term Goal 3 - Progress (Week 3): Progressing toward goal Week 4:  OT Short Term Goal 1 (Week 4): Pt will demonstrate improved orientation to identify that she is in a hospital with mod verbal cues OT Short Term Goal 2 (Week 4): Pt will  initiate dressing tasks with min verbal cues when presented with clothing OT Short Term Goal 3 (Week 4): Pt will attend to bathing tasks for 5 minutes with min verbal cues        Therapy Documentation Precautions:  Precautions Precautions: Cervical, Fall, Other (comment) Precaution Comments: abdominal incision Required Braces or Orthoses: Cervical Brace Cervical Brace: Hard collar, At all times Restrictions Weight Bearing Restrictions: No    See Function Navigator for Current Functional Status.    [name] [facility] 11/02/2016, 3:05 PM

## 2016-11-02 NOTE — Progress Notes (Signed)
Occupational Therapy Session Note  Patient Details  Name: Jacqueline [name] MRN: [id number] Date of Birth: 10/22/1965  Today's Date: 11/02/2016 OT Individual Time: 0800-0900 OT Individual Time Calculation (min): 60 min    Short Term Goals: Week 3:  OT Short Term Goal 1 (Week 3): Pt will demonstrate improved orientation to identify that she is in a hospital with mod verbal cues OT Short Term Goal 2 (Week 3): Pt will initiate dressing tasks with min verbal cues when presented with clothing OT Short Term Goal 3 (Week 3): Pt will attend to bathing tasks for 5 minutes with min verbal cues   Skilled Therapeutic Interventions/Progress Updates:    Focus on awareness, orientation, attention and problem solving.  Pt performs gait throughout unit with supervision, continues to require cues for attention to obstacles and for path finding.  Puzzle activity and word search with mod cuing for puzzle, min cues for attention to tasks x 20 mintues.  Pt continues to perseverate on going home, decreased memory of previous conversations on this topic. Pt requires mod cuing for orientation to place and situation this session.              Therapy Documentation Precautions:  Precautions Precautions: Cervical, Fall, Other (comment) Precaution Comments: abdominal incision Required Braces or Orthoses: Cervical Brace Cervical Brace: Hard collar, At all times Restrictions Weight Bearing Restrictions: No General:   Vital Signs:   Pain:   ADL: ADL ADL Comments: refer to functional navigator Exercises:   Other Treatments:    See Function Navigator for Current Functional Status.   Therapy/Group: Individual Therapy  Leroy Libman 11/02/2016, 9:14 AM

## 2016-11-02 NOTE — Progress Notes (Signed)
Social Work Patient ID: Jacqueline [name], female   DOB: Feb 25, 1966, 51 y.o.   MRN: [id number]   Able to speak with mother when in to visit today.  She understands that we are trying to allow pt more freedom with bed open but safety measure still in place.  Pt remains confused to situation and insistent on going home.  She is more easily redirected during the day.  Will begin speaking with potential care facilities about her case but will clearly need a locked unit.    Zeniah Briney, LCSW

## 2016-11-02 NOTE — Progress Notes (Signed)
Physical Therapy Note  Patient Details  Name: ZIANNA PARELLA MRN: [id number] Date of Birth: 24-Jan-1966 Today's Date: 11/02/2016    Time: 1100-1153 53 minutes  1:1 No c/o pain. Session continues with focus on awareness, attention and problem solving. Pt performed nu step x 11 minutes level 4 with only 2 x cuing to continue task. Pt performs gait throughout unit with supervision, continues to require cues for attention to obstacles and for path finding.  Puzzle activity and word search with mod cuing for puzzle, min cues for attention to tasks x 20 mintues.  Pt continues to perseverate on going home, decreased memory of previous conversations on this topic. Pt requires mod cuing for orientation to place and situation this session.   Time 2: 1430-1457 27 minutes  1:1 No c/o pain. Session focused on decreasing pt's agitation and perseveration on leaving the unit.  Attempted redirection, distraction. Ultimately had to physically redirect pt into geri chair, pt at nurses station.  Arun Herrod 11/02/2016, 11:51 AM

## 2016-11-02 NOTE — Progress Notes (Signed)
Occupational Therapy Note  Patient Details  Name: Jacqueline [name] MRN: [id number] Date of Birth: 09/18/1966  Today's Date: 11/02/2016 OT Individual Time: 1300-1400 OT Individual Time Calculation (min): 60 min   Pt denied pain Individual Therapy  Pt resting in gerichair upon arrival.  Focus on orientation, self feeding, and cognitive remediation.  Pt continues to exhibit impaired short term memory/recall.  Pt required tot A for orientation to place, year, age, situation.  Pt required verbal cues multiple times, often during the same topic thread.  Pt convinced that her car is in the parking lot and she can drive home if she could leave AMA.  Pt alternated between stating that her home is in [place] and [place], occasionally during the same sentence.  Pt requires constant redirection and declines to eat, stating that she just ate.  Pt remained in [place].   [name] [facility] 11/02/2016, 2:51 PM

## 2016-11-02 NOTE — Progress Notes (Signed)
[place] PHYSICAL MEDICINE & REHABILITATION     PROGRESS NOTE    Subjective/Complaints: Continuing trials of enclosure bed being unzipped. Pt stating she's ready to go home. Making some attempts to leave room. Requiring constant supervision  ROS: pt denies nausea, vomiting, diarrhea, cough, shortness of breath or chest pain      Objective: Vital Signs: Blood pressure 116/65, pulse 95, temperature 98 F (36.7 C), temperature source Oral, resp. rate 16, weight 82 kg (180 lb 12.4 oz), last menstrual period 10/09/2016, SpO2 98 %. No results found. No results for input(s): WBC, HGB, HCT, PLT in the last 72 hours. No results for input(s): NA, K, CL, GLUCOSE, BUN, CREATININE, CALCIUM in the last 72 hours.  Invalid input(s): CO CBG (last 3)  No results for input(s): GLUCAP in the last 72 hours.  Wt Readings from Last 3 Encounters:  11/02/16 82 kg (180 lb 12.4 oz)  10/09/16 71.4 kg (157 lb 4.8 oz)  08/03/16 93 kg (205 lb)    Physical Exam:  Gen: NAD. Vital signs reviewed Eyes: No discharge. EOMI. Cardiovascular: RRR Respiratory: CTA B GI: Soft, non-distended. Abdominal incision with bed of pink granulation which is shrinking  Musc: No edema, no tenderness Neurological: She is alert.  Motor: Moves all 4 ext Skin: dry  Psych: confused, confabulates. Oriented to person, month with cues. Does not recall me  Assessment/Plan: 1. Functional, cognitive and behavioral deficits secondary to severe TBI which require 3+ hours per day of interdisciplinary therapy in a comprehensive inpatient rehab setting. Physiatrist is providing close team supervision and 24 hour management of active medical problems listed below. Physiatrist and rehab team continue to assess barriers to discharge/monitor patient progress toward functional and medical goals.  Function:  Bathing Bathing position Bathing activity did not occur: Refused Position: Production manager parts bathed by patient:  Right arm, Left arm, Chest, Front perineal area, Buttocks, Right upper leg, Left upper leg, Right lower leg, Left lower leg, Back Body parts bathed by helper: Right arm, Left arm, Front perineal area, Buttocks, Right upper leg, Left upper leg, Right lower leg, Left lower leg, Back  Bathing assist Assist Level: Supervision or verbal cues      Upper Body Dressing/Undressing Upper body dressing   What is the patient wearing?: Pull over shirt/dress     Pull over shirt/dress - Perfomed by patient: Thread/unthread right sleeve, Thread/unthread left sleeve, Put head through opening, Pull shirt over trunk Pull over shirt/dress - Perfomed by helper: Thread/unthread right sleeve, Thread/unthread left sleeve, Put head through opening, Pull shirt over trunk        Upper body assist Assist Level: Supervision or verbal cues      Lower Body Dressing/Undressing Lower body dressing   What is the patient wearing?: Underwear, Pants, Socks, Shoes Underwear - Performed by patient: Thread/unthread right underwear leg, Thread/unthread left underwear leg, Pull underwear up/down   Pants- Performed by patient: Thread/unthread right pants leg, Thread/unthread left pants leg, Pull pants up/down Pants- Performed by helper: Pull pants up/down Non-skid slipper socks- Performed by patient: Don/doff right sock, Don/doff left sock Non-skid slipper socks- Performed by helper: Don/doff right sock, Don/doff left sock Socks - Performed by patient: Don/doff right sock, Don/doff left sock   Shoes - Performed by patient: Don/doff right shoe, Don/doff left shoe            Lower body assist Assist for lower body dressing: Supervision or verbal cues      Toileting Toileting  Toileting steps completed by patient: Adjust clothing prior to toileting, Performs perineal hygiene, Adjust clothing after toileting Toileting steps completed by helper: Performs perineal hygiene Toileting Assistive Devices: Grab bar or rail   Toileting assist Assist level: Supervision or verbal cues   Transfers Chair/bed transfer   Chair/bed transfer method: Ambulatory Chair/bed transfer assist level: Supervision or verbal cues Chair/bed transfer assistive device: Armrests     Locomotion Ambulation     Max distance: >150 ft Assist level: Supervision or verbal cues   Wheelchair   Type: Manual Max wheelchair distance: 110' Assist Level: Supervision or verbal cues  Cognition Comprehension Comprehension assist level: Understands basic 50 - 74% of the time/ requires cueing 25 - 49% of the time  Expression Expression assist level: Expresses basic needs/ideas: With no assist  Social Interaction Social Interaction assist level: Interacts appropriately 25 - 49% of time - Needs frequent redirection.  Problem Solving Problem solving assist level: Solves basic 25 - 49% of the time - needs direction more than half the time to initiate, plan or complete simple activities  Memory Memory assist level: Recognizes or recalls 25 - 49% of the time/requires cueing 50 - 75% of the time   Medical Problem List and Plan: 1. Decreased functional mobility  secondary to TBI/large right occipital epidural hematoma, mildly displaced right temporal bone fracture,Posterior cervical spine ligamentous injury with cervical collar, multiple rib fractures after walking in front of a bus             -continue CIR             -RLAS V+   -still with profound cognitive deficits 2.  DVT Prophylaxis/Anticoagulation: SCDs. Dopplers negative 3. Pain Management: Hycet as needed 4. Mood/agitation related to traumatic brain injury:   -sleep/wake better   -continue risperdal at 2mg  qhs. Resume 0.5mg  risperdal during the day   -changed wellbutrin to celexa 20mg  qhs  -Klonopin  1 mg  BID  - depakote  increased to 750mg  TID  -continue nicotine patch  -   librium 10mg  QHS back     5. Neuropsych: This patient is capable of making decisions on her own behalf.              -continue to unzip the enclosure bed 6. Skin/Wound Care: wound clean and continues closing  -Surgery signed off for now. abx completed  -continue wet to dry dressings daily 7. Fluids/Electrolytes/Nutrition:   -potassium 4.1      -decr supp to daily  -stool improved    8. Seizure prophylaxis. Keppra 500 mg twice a day  9. Dysphagia with decreased nutritional storage. Clear liquids with honey thick liquids 10. 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   -decannulated   11. 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 Acute blood loss anemia. hgb 10.4  hgb 9.9 13. Polysubstance abuse. Provide counseling when appropriate 14. Escherichia coli UTI. Keflex completed  LOS (Days) 24 A FACE TO FACE EVALUATION WAS PERFORMED  Meredith Staggers, MD 11/02/2016 10:40 AM

## 2016-11-02 NOTE — Progress Notes (Signed)
Speech Language Pathology Daily Session Note  Patient Details  Name: Jacqueline [name] MRN: [id number] Date of Birth: Jun 01, 1966  Today's Date: 11/02/2016 SLP Individual Time: 1000-1100 SLP Individual Time Calculation (min): 60 min  Short Term Goals: Week 4: SLP Short Term Goal 1 (Week 4): Patient will identify 1 cognitive and 1 physical deficit with Max A multimodal cues.  SLP Short Term Goal 2 (Week 4): Patient will demonstrate basic problem solving for self-care tasks with Mod A multimodal cues.  SLP Short Term Goal 3 (Week 4): Patient will orient to place and situation with Mod A multimodal cues.  SLP Short Term Goal 4 (Week 4): Patient will demonstrate sustained attention to a task for 5 minutes with Max A multimodal cues.  SLP Short Term Goal 5 (Week 4): Patient will consume trials of Dys. 3 textures and demonstrate efficient mastication with complete oral clearance and without overt s/s of aspiration with Mod A verbal cues over 2 sessions prior to upgrade.   Skilled Therapeutic Interventions: Skilled treatment session focused on dysphagia and cognitive goals. SLP facilitated session by administering trials of Dys. 3 textures.  Patient demonstrated efficient mastication with complete oral clearance without overt s/s of aspiration but required Mod A verbal cues for attention to bolus/self-feeding due to verbosity. Recommend trial tray prior to upgrade. Patient required Total-Max A multimodal cues for orientation to person in regards to age, time, place after ~30 second delay. Patient perseverative on going home and required total A for intellectual awareness of both physical and cognitive deficits but was easily redirected. Patient left upright at RN station with all needs within reach. Continue with current plan of care.      Function:  Eating Eating   Modified Consistency Diet: Yes Eating Assist Level: More than reasonable amount of time;Supervision or verbal cues            Cognition Comprehension Comprehension assist level: Understands basic 50 - 74% of the time/ requires cueing 25 - 49% of the time  Expression   Expression assist level: Expresses basic needs/ideas: With no assist  Social Interaction Social Interaction assist level: Interacts appropriately 25 - 49% of time - Needs frequent redirection.  Problem Solving Problem solving assist level: Solves basic 25 - 49% of the time - needs direction more than half the time to initiate, plan or complete simple activities  Memory Memory assist level: Recognizes or recalls 25 - 49% of the time/requires cueing 50 - 75% of the time    Pain No/Denies Pain   Therapy/Group: Individual Therapy  Madigan Rosensteel 11/02/2016, 3:23 PM

## 2016-11-03 ENCOUNTER — Inpatient Hospital Stay (HOSPITAL_COMMUNITY): Payer: Self-pay | Admitting: Occupational Therapy

## 2016-11-03 DIAGNOSIS — S062X4S Diffuse traumatic brain injury with loss of consciousness of 6 hours to 24 hours, sequela: Secondary | ICD-10-CM
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
## 2016-11-03 NOTE — Progress Notes (Signed)
Occupational Therapy Session Note  Patient Details  Name: Jacqueline [name] MRN: [id number] Date of Birth: 1966-02-09  Today's Date: 11/03/2016 OT Individual Time: 9767-3419 OT Individual Time Calculation (min): 46 min    Short Term Goals: Week 1:  OT Short Term Goal 1 (Week 1): Pt will demonstrate improved praxis by using deoderant in the correct manner. OT Short Term Goal 1 - Progress (Week 1): Progressing toward goal OT Short Term Goal 2 (Week 1): Pt will demonstrate improved sitting balance EOB with S while engaging in UB self care. OT Short Term Goal 2 - Progress (Week 1): Met OT Short Term Goal 3 (Week 1): Pt will demonstrate improved standing balance of min A while engaging in LB self care. OT Short Term Goal 3 - Progress (Week 1): Met OT Short Term Goal 4 (Week 1): Pt will attend to self care task for at least 3 minutes without redirection.  OT Short Term Goal 4 - Progress (Week 1): Progressing toward goal OT Short Term Goal 5 (Week 1): Pt will demonstrate improved orientation to identify that she is in a hospital. OT Short Term Goal 5 - Progress (Week 1): Progressing toward goal Week 2:  OT Short Term Goal 1 (Week 2): Pt will demonstrate improved praxis by using deoderant in the correct manner with min verbal cues OT Short Term Goal 1 - Progress (Week 2): Met OT Short Term Goal 2 (Week 2): Pt will attend to self care task for at least 3 minutes without redirection.  OT Short Term Goal 2 - Progress (Week 2): Met OT Short Term Goal 3 (Week 2): Pt will demonstrate improved orientation to identify that she is in a hospital with mod verbal cues OT Short Term Goal 3 - Progress (Week 2): Progressing toward goal Week 3:  OT Short Term Goal 1 (Week 3): Pt will demonstrate improved orientation to identify that she is in a hospital with mod verbal cues OT Short Term Goal 1 - Progress (Week 3): Progressing toward goal OT Short Term Goal 2 (Week 3): Pt will initiate dressing tasks with min  verbal cues when presented with clothing OT Short Term Goal 2 - Progress (Week 3): Progressing toward goal OT Short Term Goal 3 (Week 3): Pt will attend to bathing tasks for 5 minutes with min verbal cues  OT Short Term Goal 3 - Progress (Week 3): Progressing toward goal Week 4:  OT Short Term Goal 1 (Week 4): Pt will demonstrate improved orientation to identify that she is in a hospital with mod verbal cues OT Short Term Goal 2 (Week 4): Pt will initiate dressing tasks with min verbal cues when presented with clothing OT Short Term Goal 3 (Week 4): Pt will attend to bathing tasks for 5 minutes with min verbal cues   Skilled Therapeutic Interventions/Progress Updates: Pt was received at nursing station. Agreeable to tx with encouragement. She was taken to dayroom and participated in seated activity (Blink) focusing on turn taking, following discussed instructions, categorization/matching, attention, and problem solving. Pt was able to follow instructions with 90% accuracy with min cues, able to maintain attention to finish game with mod cues for redirection during episodes of tangential speech.  Coffee prep (decaf) completed with setup, with pt pouring grape juice into her coffee. "I want it colder." Pt still disoriented to place and time. At end of session pt was returned to nursing station with lap tray and safety belt donned.       Therapy Documentation Precautions:  Precautions Precautions: Cervical, Fall, Other (comment) Precaution Comments: abdominal incision Required Braces or Orthoses: Cervical Brace Cervical Brace: Hard collar, At all times Restrictions Weight Bearing Restrictions: No    Pain: No c/o pain during session    ADL: ADL ADL Comments: refer to functional navigator    See Function Navigator for Current Functional Status.   Therapy/Group: Individual Therapy  Huriel Matt A Reylynn Vanalstine 11/03/2016, 3:25 PM

## 2016-11-04 ENCOUNTER — Inpatient Hospital Stay (HOSPITAL_COMMUNITY): Payer: Medicaid Other | Admitting: Physical Therapy

## 2016-11-04 NOTE — Progress Notes (Signed)
1130 11/04/16 nursing Patient has been hollering, kicking RN . NT. Patient placed on enclosure bed but was able to unzip from the middle with all clips intact. When asked how she got out she said I used a pen. RN found a pen and pad inside her bed. RN took it out. Charge RN notified Continued to monitor.
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
## 2016-11-04 NOTE — Progress Notes (Signed)
11/04/16/1525 nursing Patient was quiet doing puzzle at nurses station then after that she started saying she wants to go home then started to get out of the chair. 3 RN's and NT placed her back to enclosure bed but she started to kick RN's and NT. Patient trying to get out again using the same trick she did this morning using her fingernails.Continued to monitor

## 2016-11-04 NOTE — Progress Notes (Signed)
Patient ID: Jacqueline [name], female   DOB: [birth year], 51 y.o.   MRN: [id number]   11/04/2016.  Jacqueline [name] is a 51 y.o. female Admit for CIR with decreased functional mobilitysecondary to TBI/large right occipital epidural hematoma, mildly displaced right temporal bone fracture,Posterior cervical spine ligamentous injury with cervical collar,multiple rib fractures.   Subjective: No new complaints. Sitting in wheelchair at nursing station.  States "I want to go home " repeatedly.  Past Medical History:  Diagnosis Date  . Alcohol abuse   . Alcoholism ([place])   . Anemia   . Anxiety   . Back pain   . Benzodiazepine abuse   . Depression   . Hypoglycemia   . Hypothyroidism   . Narcotic abuse   . Peptic ulcer   . Seizures ([place])   . Suicide attempt    multiple times  . Thrombocytopenia ([place]) 06/17/2011  . Thyroid disease      Objective: Vital signs in last 24 hours: Temp:  [98.2 F (36.8 C)-98.6 F (37 C)] 98.6 F (37 C) (01/28 0534) Pulse Rate:  [86-91] 86 (01/28 0534) Resp:  [17-18] 17 (01/28 0534) BP: (98-99)/(61-65) 99/61 (01/28 0534) SpO2:  [97 %-99 %] 97 % (01/28 0534) Weight:  [178 lb 4.8 oz (80.9 kg)] 178 lb 4.8 oz (80.9 kg) (01/28 0534) Weight change: -1 lb 11.2 oz (-0.771 kg) Last BM Date: 11/03/16  Intake/Output from previous day: 01/27 0701 - 01/28 0700 In: 860 [P.O.:860] Out: -  Last cbgs: CBG (last 3)  No results for input(s): GLUCAP in the last 72 hours.   Physical Exam General: No apparent distress   HEENT: not dry; clear Lungs: Normal effort. Lungs clear to auscultation, no crackles or wheezes. Cardiovascular: Regular rate and rhythm, no edema Abdomen: S/NT/ND; BS(+) Musculoskeletal:  unchanged Neurological: No new neurological deficits  Mental state: Alert, oriented, confused; confabulates    Lab Results: BMET    Component Value Date/Time   NA 137 10/29/2016 1108   NA 142 05/16/2014 2310   K 4.1 10/29/2016 1108   K 3.5  05/16/2014 2310   CL 100 (L) 10/29/2016 1108   CL 106 05/16/2014 2310   CO2 28 10/29/2016 1108   CO2 27 05/16/2014 2310   GLUCOSE 81 10/29/2016 1108   GLUCOSE 111 (H) 05/16/2014 2310   BUN <5 (L) 10/29/2016 1108   BUN 10 05/16/2014 2310   CREATININE 0.75 10/29/2016 1108   CREATININE 0.96 05/16/2014 2310   CALCIUM 8.3 (L) 10/29/2016 1108   CALCIUM 7.5 (L) 05/16/2014 2310   GFRNONAA >60 10/29/2016 1108   GFRNONAA >60 05/16/2014 2310   GFRAA >60 10/29/2016 1108   GFRAA >60 05/16/2014 2310   CBC    Component Value Date/Time   WBC 10.3 10/29/2016 1108   RBC 3.46 (L) 10/29/2016 1108   HGB 10.2 (L) 10/29/2016 1108   HGB 10.1 (L) 05/16/2014 2310   HCT 32.5 (L) 10/29/2016 1108   HCT 32.4 (L) 05/16/2014 2310   PLT 488 (H) 10/29/2016 1108   PLT 230 05/16/2014 2310   MCV 93.9 10/29/2016 1108   MCV 86 05/16/2014 2310   MCH 29.5 10/29/2016 1108   MCHC 31.4 10/29/2016 1108   RDW 19.9 (H) 10/29/2016 1108   RDW 23.2 (H) 05/16/2014 2310   LYMPHSABS 2.0 10/22/2016 0513   MONOABS 1.8 (H) 10/22/2016 0513   EOSABS 0.2 10/22/2016 0513   BASOSABS 0.1 10/22/2016 0513    Studies/Results: No results found.  Medications: I have reviewed the patient's current  medications.  Assessment/Plan:  Decreased functional mobility/cognition secondary to TBI Seizure prophylaxis- contine Keppra H/O PSA  S/p UTI    Length of stay, days: [place] , MD 11/04/2016, 9:14 AM

## 2016-11-05 ENCOUNTER — Inpatient Hospital Stay (HOSPITAL_COMMUNITY): Payer: Medicaid Other | Admitting: Speech Pathology

## 2016-11-05 ENCOUNTER — Inpatient Hospital Stay (HOSPITAL_COMMUNITY): Payer: Medicaid Other | Admitting: Physical Therapy

## 2016-11-05 ENCOUNTER — Inpatient Hospital Stay (HOSPITAL_COMMUNITY): Payer: Self-pay

## 2016-11-05 ENCOUNTER — Inpatient Hospital Stay (HOSPITAL_COMMUNITY): Payer: Self-pay | Admitting: Speech Pathology

## 2016-11-05 NOTE — Progress Notes (Signed)
Physical Therapy Session Note  Patient Details  Name: Jacqueline [name] MRN: [id number] Date of Birth: 1966-05-01  Today's Date: 11/05/2016 PT Individual Time: 1305-1405 PT Individual Time Calculation (min): 60 min   Short Term Goals: Week 4:  PT Short Term Goal 1 (Week 4): Pt will be oriented to location with min cuing. PT Short Term Goal 2 (Week 4): Pt will negotiate 12 steps with supervision for strengthening with PT. PT Short Term Goal 3 (Week 4): Pt will demonstrate intellectual awareness with moderate cuing.  Skilled Therapeutic Interventions/Progress Updates:    Pt received in recliner. Pt without any behaviors or c/o pain during session. Session focused on functional mobility & cognitive remediation. Pt ambulated throughout unit without AD & supervision with improving balance during gait. Pt with continent void on toilet with supervision for balance during task. Pt assembled puzzle with max cuing for problem solving & error correction. Pt perseverative on going home requiring max cuing for redirection. Pt oriented to location 75% of the time during session. At end of session pt left in handoff to OT.  Therapy Documentation Precautions:  Precautions Precautions: Cervical, Fall, Other (comment) Precaution Comments: abdominal incision Required Braces or Orthoses: Cervical Brace Cervical Brace: Hard collar, At all times Restrictions Weight Bearing Restrictions: No   See Function Navigator for Current Functional Status.   Therapy/Group: Individual Therapy  [name] 11/05/2016, 2:31 PM

## 2016-11-05 NOTE — Progress Notes (Signed)
Speech Language Pathology Daily Session Note  Patient Details  Name: Jacqueline [name] MRN: [id number] Date of Birth: May 19, 1966  Today's Date: 11/05/2016 SLP Individual Time: 1445-1530 SLP Individual Time Calculation (min): 45 min  Short Term Goals: Week 4: SLP Short Term Goal 1 (Week 4): Patient will identify 1 cognitive and 1 physical deficit with Max A multimodal cues.  SLP Short Term Goal 2 (Week 4): Patient will demonstrate basic problem solving for self-care tasks with Mod A multimodal cues.  SLP Short Term Goal 3 (Week 4): Patient will orient to place and situation with Mod A multimodal cues.  SLP Short Term Goal 4 (Week 4): Patient will demonstrate sustained attention to a task for 5 minutes with Max A multimodal cues.  SLP Short Term Goal 5 (Week 4): Patient will consume trials of Dys. 3 textures and demonstrate efficient mastication with complete oral clearance and without overt s/s of aspiration with Mod A verbal cues over 2 sessions prior to upgrade.   Skilled Therapeutic Interventions: Skilled treatment session focused on cognition and dysphagia goals. SLP facilitated session by providing skilled observation of pt with thin liquids consumed via straw. Pt needed Min A cues for rate d/t impulsivity. Pt demonstrated sustained attention without any off-topic comments for ~15 minutes in mildly distracting environment while completing basic problem solving task (connect 4). After this initial 15 minute period of time, pt required continuous re-orientation to all tasks and information. Pt returned to room and was handed off to nursing.      Function:  Eating Eating   Modified Consistency Diet: No Eating Assist Level: Supervision or verbal cues           Cognition Comprehension Comprehension assist level: Understands basic 50 - 74% of the time/ requires cueing 25 - 49% of the time  Expression   Expression assist level: Expresses basic 50 - 74% of the time/requires cueing 25 -  49% of the time. Needs to repeat parts of sentences.  Social Interaction Social Interaction assist level: Interacts appropriately 25 - 49% of time - Needs frequent redirection.  Problem Solving Problem solving assist level: Solves basic 25 - 49% of the time - needs direction more than half the time to initiate, plan or complete simple activities  Memory Memory assist level: Recognizes or recalls 25 - 49% of the time/requires cueing 50 - 75% of the time    Pain    Therapy/Group: Individual Therapy  Jenetta Wease B. Rutherford Nail, M.S., CCC-SLP Speech-Language Pathologist  Suhani Stillion 11/05/2016, 3:40 PM
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
## 2016-11-05 NOTE — Progress Notes (Signed)
Occupational Therapy Note  Patient Details  Name: Jacqueline [name] MRN: [id number] Date of Birth: March 12, 1966  Today's Date: 11/05/2016 OT Individual Time: [id number] OT Individual Time Calculation (min): 30 min   Pt denied pain Individual therapy  Pt engaged in table activity (Connect 4) with focus on orientation, attention to task, following rules of game, activity tolerance, and safety awareness.  Pt continues to require tot A for orientation to state, city, date, situation.  Pt exhibits difficulty recalling name of therapist at end of session.  Pt continues to perseverate on going home and required max A for redirection.  Pt returned to room and left in room with NT attending.     [name] [facility] 11/05/2016, 2:41 PM

## 2016-11-05 NOTE — Plan of Care (Signed)
Problem: RH SKIN INTEGRITY Goal: RH STG ABLE TO PERFORM INCISION/WOUND CARE W/ASSISTANCE STG Able To Perform Incision/Wound Care With max Assistance.  Outcome: Not Progressing Takes dressings off  Problem: RH SAFETY Goal: RH STG ADHERE TO SAFETY PRECAUTIONS W/ASSISTANCE/DEVICE STG Adhere to Safety Precautions With mod  Assistance/Device.  Outcome: Not Progressing impulsive
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
## 2016-11-05 NOTE — Progress Notes (Signed)
Speech Language Pathology Daily Session Note  Patient Details  Name: Jacqueline [name] MRN: [id number] Date of Birth: [birth year]  Today's Date: 11/05/2016 SLP Individual Time: 1100-1200 SLP Individual Time Calculation (min): 60 min  Short Term Goals: Week 4: SLP Short Term Goal 1 (Week 4): Patient will identify 1 cognitive and 1 physical deficit with Max A multimodal cues.  SLP Short Term Goal 2 (Week 4): Patient will demonstrate basic problem solving for self-care tasks with Mod A multimodal cues.  SLP Short Term Goal 3 (Week 4): Patient will orient to place and situation with Mod A multimodal cues.  SLP Short Term Goal 4 (Week 4): Patient will demonstrate sustained attention to a task for 5 minutes with Max A multimodal cues.  SLP Short Term Goal 5 (Week 4): Patient will consume trials of Dys. 3 textures and demonstrate efficient mastication with complete oral clearance and without overt s/s of aspiration with Mod A verbal cues over 2 sessions prior to upgrade.   Skilled Therapeutic Interventions: Skilled treatment session focused on cognitive and dysphagia goals.  SLP facilitated session by providing skilled observation with lunch meal of Dys. 3 textures with thin liquids. Patient demonstrated efficient mastication with complete oral clearance without overt s/s of aspiration but required Mod A verbal cues for attention to bolus/self-feeding due to verbosity. Recommend patient upgrade to Dys. 3 textures and continue full supervision. Patient required Total-Max A multimodal cues for orientation to person in regards to age, time and place after ~30 second delay. Patient perseverative on going home with language of confusion and required total A for intellectual awareness of both physical and cognitive deficits but was easily redirected. Patient left upright in [place] with mom present. Continue with current plan of care.      Function:  Eating Eating   Modified Consistency Diet: Yes Eating  Assist Level: More than reasonable amount of time;Supervision or verbal cues           Cognition Comprehension Comprehension assist level: Understands basic 50 - 74% of the time/ requires cueing 25 - 49% of the time  Expression   Expression assist level: Expresses basic 50 - 74% of the time/requires cueing 25 - 49% of the time. Needs to repeat parts of sentences.  Social Interaction Social Interaction assist level: Interacts appropriately 25 - 49% of time - Needs frequent redirection.  Problem Solving Problem solving assist level: Solves basic 25 - 49% of the time - needs direction more than half the time to initiate, plan or complete simple activities  Memory Memory assist level: Recognizes or recalls 25 - 49% of the time/requires cueing 50 - 75% of the time    Pain No/Denies Pain   Therapy/Group: Individual Therapy  [name] 11/05/2016, 12:16 PM

## 2016-11-05 NOTE — Progress Notes (Signed)
Occupational Therapy Session Note  Patient Details  Name: Jacqueline [name] MRN: [id number] Date of Birth: 1966-06-25  Today's Date: 11/05/2016 OT Individual Time: 0700-0759 OT Individual Time Calculation (min): 59 min    Short Term Goals: Week 4:  OT Short Term Goal 1 (Week 4): Pt will demonstrate improved orientation to identify that she is in a hospital with mod verbal cues OT Short Term Goal 2 (Week 4): Pt will initiate dressing tasks with min verbal cues when presented with clothing OT Short Term Goal 3 (Week 4): Pt will attend to bathing tasks for 5 minutes with min verbal cues   Skilled Therapeutic Interventions/Progress Updates:    Pt resting in secured enclosure bed upon arrival. Pt requested to use toilet.  After enclosure bed unzipped the pt asked what we were going to do.  Pt reminded that she wanted to use the toilet and she stated, "Oh yeah, I remember now." Pt completed toileting tasks at supervision level.  Pt declined shower this morning. Pt engaged in eating breakfast with focus on task initiation, sequencing, attention to task, orientation, and safety awareness.  Pt continues to require tot A for orientation and recall of earlier events.  Pt requires tot A for recall of staff names.  Pt did not recognize her brother when he stopped to visit.  Pt required mod A to agree that visitor was her brother.  Pt remained in her room at end of session.  Therapy Documentation Precautions:  Precautions Precautions: Cervical, Fall, Other (comment) Precaution Comments: abdominal incision Required Braces or Orthoses: Cervical Brace Cervical Brace: Hard collar, At all times Restrictions Weight Bearing Restrictions: No Pain:  Pt denied pain  See Function Navigator for Current Functional Status.   Therapy/Group: Individual Therapy  Leroy Libman 11/05/2016, 8:01 AM

## 2016-11-06 ENCOUNTER — Inpatient Hospital Stay (HOSPITAL_COMMUNITY): Payer: Self-pay | Admitting: Physical Therapy

## 2016-11-06 ENCOUNTER — Inpatient Hospital Stay (HOSPITAL_COMMUNITY): Payer: Medicaid Other | Admitting: Physical Therapy

## 2016-11-06 ENCOUNTER — Inpatient Hospital Stay (HOSPITAL_COMMUNITY): Payer: Self-pay

## 2016-11-06 ENCOUNTER — Inpatient Hospital Stay (HOSPITAL_COMMUNITY): Payer: Medicaid Other | Admitting: Speech Pathology

## 2016-11-06 ENCOUNTER — Inpatient Hospital Stay (HOSPITAL_COMMUNITY): Payer: Medicaid Other | Admitting: Occupational Therapy

## 2016-11-06 MED ORDER — LEVETIRACETAM 250 MG PO TABS
250.0000 mg | ORAL_TABLET | Freq: Two times a day (BID) | ORAL | Status: DC
  Administered 2016-11-06 – 2016-11-16 (×20): 250 mg via ORAL
  Filled 2016-11-06 (×20): qty 1

## 2016-11-06 NOTE — Progress Notes (Signed)
Occupational Therapy Session Note  Patient Details  Name: Jacqueline [name] MRN: [id number] Date of Birth: 31-Aug-1966  Today's Date: 11/06/2016 OT Individual Time: 0700-0800 OT Individual Time Calculation (min): 60 min    Short Term Goals: Week 4:  OT Short Term Goal 1 (Week 4): Pt will demonstrate improved orientation to identify that she is in a hospital with mod verbal cues OT Short Term Goal 2 (Week 4): Pt will initiate dressing tasks with min verbal cues when presented with clothing OT Short Term Goal 3 (Week 4): Pt will attend to bathing tasks for 5 minutes with min verbal cues   Skilled Therapeutic Interventions/Progress Updates:    Pt resting in chair upon arrival.  Pt stated that it was night time and she had already eaten supper.  Pt reminded that it was morning and the meal was breakfast.  Pt ate approx 50% of meal and continues to mix food items and liquid items.  Focus on orientation, task initiation, sequencing, attention to task, and safety awareness.  Pt continues to require tot A for orientation and max A for task initiation and sequencing.  Pt maintains attention to functional tasks for approx 5 mins.  Pt remained in room at end of session.   Therapy Documentation Precautions:  Precautions Precautions: Cervical, Fall, Other (comment) Precaution Comments: abdominal incision Required Braces or Orthoses: Cervical Brace Cervical Brace: Hard collar, At all times Restrictions Weight Bearing Restrictions: No Pain:  Pt denied pain  See Function Navigator for Current Functional Status.   Therapy/Group: Individual Therapy  Leroy Libman 11/06/2016, 8:02 AM

## 2016-11-06 NOTE — Progress Notes (Signed)
Physical Therapy Note  Patient Details  Name: Jacqueline [name] MRN: [id number] Date of Birth: 1966/03/14 Today's Date: 11/06/2016    Time: [phone] 57 minutes  1:1 No c/o pain. Pt ambulated without assistance throughout unit in home and controlled environments with improving awareness of and attention to surroundings.  Nu step x 10 minutes with only 1 cue to continue task, much improved attention.  Pt performed 2 puzzles with mod/max cuing for problem solving, planning and execution. Pt easily distracted today during puzzle task, needs more cues for redirection during cognitive vs physical tasks.   Jalaysia Lobb 11/06/2016, 10:27 AM

## 2016-11-06 NOTE — Plan of Care (Signed)
Problem: RH SAFETY Goal: RH STG ADHERE TO SAFETY PRECAUTIONS W/ASSISTANCE/DEVICE STG Adhere to Safety Precautions With mod  Assistance/Device.  Outcome: Not Progressing Continues to need redirection and de-escalation.
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
## 2016-11-06 NOTE — Progress Notes (Signed)
Occupational Therapy Session Note  Patient Details  Name: Jacqueline [name] MRN: [id number] Date of Birth: 12/20/1965  Today's Date: 11/06/2016 OT Individual Time: 1100-1130 OT Individual Time Calculation (min): 30 min    Short Term Goals: Week 2:  OT Short Term Goal 1 (Week 2): Pt will demonstrate improved praxis by using deoderant in the correct manner with min verbal cues OT Short Term Goal 1 - Progress (Week 2): Met OT Short Term Goal 2 (Week 2): Pt will attend to self care task for at least 3 minutes without redirection.  OT Short Term Goal 2 - Progress (Week 2): Met OT Short Term Goal 3 (Week 2): Pt will demonstrate improved orientation to identify that she is in a hospital with mod verbal cues OT Short Term Goal 3 - Progress (Week 2): Progressing toward goal Week 3:  OT Short Term Goal 1 (Week 3): Pt will demonstrate improved orientation to identify that she is in a hospital with mod verbal cues OT Short Term Goal 1 - Progress (Week 3): Progressing toward goal OT Short Term Goal 2 (Week 3): Pt will initiate dressing tasks with min verbal cues when presented with clothing OT Short Term Goal 2 - Progress (Week 3): Progressing toward goal OT Short Term Goal 3 (Week 3): Pt will attend to bathing tasks for 5 minutes with min verbal cues  OT Short Term Goal 3 - Progress (Week 3): Progressing toward goal Week 4:  OT Short Term Goal 1 (Week 4): Pt will demonstrate improved orientation to identify that she is in a hospital with mod verbal cues OT Short Term Goal 2 (Week 4): Pt will initiate dressing tasks with min verbal cues when presented with clothing OT Short Term Goal 3 (Week 4): Pt will attend to bathing tasks for 5 minutes with min verbal cues   Skilled Therapeutic Interventions/Progress Updates:    Pt seen this session to facilitate orientation and attention skills. Pt ambulated to day room with therapist.  With cues from reading daily newspaper and min-mod verbal cues, pt worked on  writing down today's date, where she was, city, date of birth, etc.  Asked pt to then write in paragraph form who she was, where she was, when it was, and why she was here.  Pt did so with mod cues to refer to first list of information she wrote and then continued to write about her personal goals. Ambulated to room to keep paper in room for her to reference.  Her brother stopped by to visit and then pt asking him where she was.  Pt became distracted and continued with her disorientation. Pt ambulated to nurses station under the supervision of the nurses.  Therapy Documentation Precautions:  Precautions Precautions: Cervical, Fall, Other (comment) Precaution Comments: abdominal incision Required Braces or Orthoses: Cervical Brace Cervical Brace: Hard collar, At all times Restrictions Weight Bearing Restrictions: No    Vital Signs: Therapy Vitals Temp: 98.4 F (36.9 C) Temp Source: Oral Pulse Rate: 93 Resp: 18 BP: 105/70 Patient Position (if appropriate): Sitting Oxygen Therapy SpO2: 99 % O2 Device: Not Delivered Pain:   ADL: ADL ADL Comments: refer to functional navigator    See Function Navigator for Current Functional Status.   Therapy/Group: Individual Therapy  SAGUIER,JULIA 11/06/2016, 8:21 AM  

## 2016-11-06 NOTE — Progress Notes (Signed)
Physical Therapy Session Note  Patient Details  Name: Jacqueline [name] MRN: [id number] Date of Birth: April 20, 1966  Today's Date: 11/06/2016 PT Individual Time: 1605-1702 PT Individual Time Calculation (min): 57 min   Short Term Goals: Week 4:  PT Short Term Goal 1 (Week 4): Pt will be oriented to location with min cuing. PT Short Term Goal 1 - Progress (Week 4): Met PT Short Term Goal 2 (Week 4): Pt will negotiate 12 steps with supervision for strengthening with PT. PT Short Term Goal 2 - Progress (Week 4): Met PT Short Term Goal 3 (Week 4): Pt will demonstrate intellectual awareness with moderate cuing. PT Short Term Goal 3 - Progress (Week 4): Progressing toward goal  Skilled Therapeutic Interventions/Progress Updates:    Pt received in zipped enclosure bed reporting need to use the bathroom. Pt ambulated throughout room & to bathroom, performed clothing management & toilet transfer with distant supervision; pt with continent void but reports she urinated in bed earlier & NT made aware. Session focused on cognitive remediation with pt engaged in card game & puzzle with max cuing to follow instructions & for problem solving. Pt oriented to location [facility]" and "[facility]") ~75% of the time; pt continues to intermittently report she is in [place], [place]. Pt requires max cuing to be oriented to situation. Pt perseverated on using phone at beginning & end of session with max cuing to redirect. At end of session pt left in room with sheet over door; pt ambulating to bathroom & RN made aware.   Therapy Documentation Precautions:  Precautions Precautions: Cervical, Fall, Other (comment) Precaution Comments: abdominal incision Required Braces or Orthoses: Cervical Brace Cervical Brace: Hard collar, At all times Restrictions Weight Bearing Restrictions: No   See Function Navigator for Current Functional Status.   Therapy/Group: Individual Therapy  [name] 11/06/2016, 5:09 PM

## 2016-11-06 NOTE — Progress Notes (Signed)
Pt sitting in room, quiet in geri chair, reading with lap tray attached.  Continue plan of care.

## 2016-11-06 NOTE — Progress Notes (Signed)
Speech Language Pathology Daily Session Note  Patient Details  Name: Jacqueline [name] MRN: [id number] Date of Birth: 01/30/1966  Today's Date: 11/06/2016 SLP Individual Time: 0830-0930 SLP Individual Time Calculation (min): 60 min  Short Term Goals: Week 4: SLP Short Term Goal 1 (Week 4): Patient will identify 1 cognitive and 1 physical deficit with Max A multimodal cues.  SLP Short Term Goal 2 (Week 4): Patient will demonstrate basic problem solving for self-care tasks with Mod A multimodal cues.  SLP Short Term Goal 3 (Week 4): Patient will orient to place and situation with Mod A multimodal cues.  SLP Short Term Goal 4 (Week 4): Patient will demonstrate sustained attention to a task for 5 minutes with Max A multimodal cues.  SLP Short Term Goal 5 (Week 4): Patient will consume trials of Dys. 3 textures and demonstrate efficient mastication with complete oral clearance and without overt s/s of aspiration with Mod A verbal cues over 2 sessions prior to upgrade.   Skilled Therapeutic Interventions: Skilled treatment session focused on dysphagia and cognitive goals. SLP facilitated session by providing extra time and Min A verbal cues for problem solving during a mildly complex, novel card task. Patient demonstrated alternating attention between structured task and snack of Dys. 3 textures with thin liquids via straw for ~ 10 minutes with supervision verbal cues for redirection. Patient consumed snack without overt s/s of aspiration. Therefore, recommend patient upgrade to liquids via straw. Patient independently requested to use the bathroom and performed all self-care tasks with supervision verbal cues. Patient left upright in [place] at [employer] Continue with current plan of care.      Function:  Eating Eating   Modified Consistency Diet: Yes Eating Assist Level: No help, No cues           Cognition Comprehension Comprehension assist level: Understands basic 75 - 89% of the  time/ requires cueing 10 - 24% of the time  Expression   Expression assist level: Expresses basic 75 - 89% of the time/requires cueing 10 - 24% of the time. Needs helper to occlude trach/needs to repeat words.  Social Interaction Social Interaction assist level: Interacts appropriately 25 - 49% of time - Needs frequent redirection.  Problem Solving Problem solving assist level: Solves basic 25 - 49% of the time - needs direction more than half the time to initiate, plan or complete simple activities  Memory Memory assist level: Recognizes or recalls 25 - 49% of the time/requires cueing 50 - 75% of the time    Pain No/Denies Pain   Therapy/Group: Individual Therapy  Corinn Stoltzfus 11/06/2016, 12:30 PM

## 2016-11-06 NOTE — Progress Notes (Signed)
Physical Therapy Weekly Progress Note  Patient Details  Name: Jacqueline [name] MRN: [id number] Date of Birth: 10-03-1966  Beginning of progress report period: October 31, 2016 End of progress report period: November 07, 2016   Patient has met 2 of 3 short term goals.  Pt progressing physically and is able to perform mobility, transfers and gait with mod I (supervision for safety).  Pt continues with deficits in awareness, orientation, and memory.  Pt has met 7/9 LTGs and focus of sessions will be on continuing to address cognitive goals.  Patient continues to demonstrate the following deficits decreased attention, decreased awareness, decreased problem solving, decreased safety awareness and decreased memory and therefore will continue to benefit from skilled PT intervention to increase functional independence with mobility.  Patient progressing toward long term goals..  Continue plan of care.  PT Short Term Goals Week 4:  PT Short Term Goal 1 (Week 4): Pt will be oriented to location with min cuing. PT Short Term Goal 1 - Progress (Week 4): Met PT Short Term Goal 2 (Week 4): Pt will negotiate 12 steps with supervision for strengthening with PT. PT Short Term Goal 2 - Progress (Week 4): Met PT Short Term Goal 3 (Week 4): Pt will demonstrate intellectual awareness with moderate cuing. PT Short Term Goal 3 - Progress (Week 4): Progressing toward goal    See Function Navigator for Current Functional Status.  Refugia Laneve 11/06/2016, 2:29 PM

## 2016-11-07 ENCOUNTER — Inpatient Hospital Stay (HOSPITAL_COMMUNITY): Payer: Self-pay

## 2016-11-07 ENCOUNTER — Inpatient Hospital Stay (HOSPITAL_COMMUNITY): Payer: Medicaid Other | Admitting: Speech Pathology

## 2016-11-07 ENCOUNTER — Inpatient Hospital Stay (HOSPITAL_COMMUNITY): Payer: Self-pay | Admitting: Physical Therapy

## 2016-11-07 ENCOUNTER — Inpatient Hospital Stay (HOSPITAL_COMMUNITY): Payer: Medicaid Other | Admitting: Occupational Therapy

## 2016-11-07 MED ORDER — ALPRAZOLAM 0.5 MG PO TABS
1.0000 mg | ORAL_TABLET | Freq: Once | ORAL | Status: AC
Start: 2016-11-07 — End: 2016-11-07
  Administered 2016-11-07: 1 mg via ORAL
  Filled 2016-11-07: qty 2

## 2016-11-07 NOTE — Progress Notes (Signed)
Occupational Therapy Note  Patient Details  Name: Jacqueline [name] MRN: [id number] Date of Birth: 1965/11/05  Today's Date: 11/07/2016 OT Individual Time: 1400-1430  OT Individual Time Calculation (min): 30 min   Pt denied pain Individual therapy  Pt standing at nursing station upon arrival and agreeable to walking into another room to make some coffee.  Pt decided to make some tea instead.  Pt required tot A to use Keurig although she had been instructed on numerous occasions on previous days. Pt required tot A to locate creamer and sweetener at coffee station.  Pt prepared tea and amb into dayroom for continued orientation.  Pt continues to require tot A for orientation.  Pt is easily redirected to current place and situation but just as quickly reverts to alternate reality for her.  Pt continues to insist that she is in [place], [place].  Pt redirected to her room and remained in room with all needs within reach. Focus on task initiation, attention to task, and orientation.    [name] 11/07/2016, 2:48 PM

## 2016-11-07 NOTE — Progress Notes (Addendum)
At approximately 0650, patient had a witnessed fall in bedroom. Tech was still cleaning patient's bed as patient had defecated in bed and nurse was cleaning patient. While tech was still cleaning the bed, nurse turned to tech and patient stumbled back on her feet and fell on her bottom. Patient had a small skin tear to the right posterior elbow as result fall. No trauma or injury to the head occurred with fall. Patient alert and oriented to self as baseline. No new changes in condition. VS stable with HR 103. Patient denies any pain or head injury. Patient assessed, VS taken and patient assisted back up on feet and sat in the chair. Nurse and tech reiterated to patient the importance of not getting up without staff's help and supervision. Patient nods in agreement.

## 2016-11-07 NOTE — Progress Notes (Signed)
Speech Language Pathology Weekly Progress and Session Note  Patient Details  Name: Jacqueline [name] MRN: [id number] Date of Birth: Mar 18, 1966  Beginning of progress report period: October 31, 2016 End of progress report period: November 07, 2016  Today's Date: 11/07/2016 SLP Individual Time: 0830-0930 SLP Individual Time Calculation (min): 60 min  Short Term Goals: Week 4: SLP Short Term Goal 1 (Week 4): Patient will identify 1 cognitive and 1 physical deficit with Max A multimodal cues.  SLP Short Term Goal 1 - Progress (Week 4): Met SLP Short Term Goal 2 (Week 4): Patient will demonstrate basic problem solving for self-care tasks with Mod A multimodal cues.  SLP Short Term Goal 2 - Progress (Week 4): Met SLP Short Term Goal 3 (Week 4): Patient will orient to place and situation with Mod A multimodal cues.  SLP Short Term Goal 3 - Progress (Week 4): Not met SLP Short Term Goal 4 (Week 4): Patient will demonstrate sustained attention to a task for 5 minutes with Max A multimodal cues.  SLP Short Term Goal 4 - Progress (Week 4): Met SLP Short Term Goal 5 (Week 4): Patient will consume trials of Dys. 3 textures and demonstrate efficient mastication with complete oral clearance and without overt s/s of aspiration with Mod A verbal cues over 2 sessions prior to upgrade.  SLP Short Term Goal 5 - Progress (Week 4): Met    New Short Term Goals: Week 5: SLP Short Term Goal 1 (Week 5): Patient will demonstrate sustained attention to a task for 10 minutes with Max A multimodal cues.  SLP Short Term Goal 2 (Week 5): Patient will orient to place and situation with Mod A multimodal cues.  SLP Short Term Goal 3 (Week 5): Patient will demonstrate basic problem solving for self-care tasks with Mod A multimodal cues.  SLP Short Term Goal 4 (Week 5): Patient will consume trials of regular textures and demonstrate efficient mastication with complete oral clearance and without overt s/s of aspiration with  Mod A verbal cues over 2 sessions prior to upgrade.  SLP Short Term Goal 5 (Week 5): Patient will identify 1 cognitive and 1 physical deficit with Mod A multimodal cues.   Weekly Progress Updates: Patient has made functional gains and has met 4 of 5 STG's this reporting period. Currently, patient is demonstrating behaviors consistent with a Rancho Level V-emerging VI and requires overall Max A to complete functional and familiar tasks safely in regards functional problem solving, sustained attention, recall of new information, and intellectual awareness. Patient continues to demonstrate verbosity with language of confusion and perseveration. A memory notebook has been initiated to maximize orientation and intellectual awareness. Patient is currently consuming Dys. 3 textures with thin liquids via cup and straw with minimal overt s/s of aspiration and requires Min-Mod A verbal cues for use of swallowing compensatory strategies.  Patient and family education is ongoing. Patient would benefit from continued skilled SLP intervention to maximize her cognitive and swallowing function and overall functional independence prior to discharge.      Intensity: Minumum of 1-2 x/day, 30 to 90 minutes Frequency: 3 to 5 out of 7 days Duration/Length of Stay: TBD due to SNF placement  Treatment/Interventions: Cognitive remediation/compensation;Cueing hierarchy;Functional tasks;Patient/family education;Therapeutic Activities;Internal/external aids;Dysphagia/aspiration precaution training;Environmental controls   Daily Session  Skilled Therapeutic Interventions: Skilled treatment session focused on cognitive goals. SLP facilitated session by initiating a memory notebook to maximize orientation to time, place and situation and for recall of events from previous therapy  sessions. Patient also generated a list of both physical and cognitive deficits with Max A multimodal cues. Patient left upright in [place] at [employer]  station with all needs within reach.       Function:   Cognition Comprehension Comprehension assist level: Understands basic 75 - 89% of the time/ requires cueing 10 - 24% of the time  Expression   Expression assist level: Expresses basic 75 - 89% of the time/requires cueing 10 - 24% of the time. Needs helper to occlude trach/needs to repeat words.  Social Interaction Social Interaction assist level: Interacts appropriately 25 - 49% of time - Needs frequent redirection.  Problem Solving Problem solving assist level: Solves basic 25 - 49% of the time - needs direction more than half the time to initiate, plan or complete simple activities  Memory Memory assist level: Recognizes or recalls 25 - 49% of the time/requires cueing 50 - 75% of the time   General    Pain No/Denies Pain   Therapy/Group: Individual Therapy  Jacqueline [name] 11/07/2016, 3:20 PM

## 2016-11-07 NOTE — Progress Notes (Signed)
Occupational Therapy Note  Patient Details  Name: Jacqueline [name] MRN: [id number] Date of Birth: 12/02/1965  Today's Date: 11/07/2016 OT Individual Time: 1130-1200 OT Individual Time Calculation (min): 30 min   Pt seen this session to facilitate attention and orientation skills. Engaged in word search with min cues attending to work sheet for at least 10 min.  Filled out form with personal data and current date.  Mod -max cues for age, current month/year. No cues to indicate location of [place] or DOB. Pt taken to nursing station for supervision.   [place] 11/07/2016, 12:13 PM
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
## 2016-11-07 NOTE — Progress Notes (Signed)
Mother, Rae Mar, was notified of patient's fall. Mother will call and notified brother, Richardson Landry, who works here at the hospital to visit patient. Mother concerned but thankful of nurse informing mother of fall.

## 2016-11-07 NOTE — Plan of Care (Signed)
Problem: RH SKIN INTEGRITY Goal: RH STG MAINTAIN SKIN INTEGRITY WITH ASSISTANCE STG Maintain Skin Integrity With mod Assistance.  Outcome: Not Progressing Abdominal incision is not adhering together.  Problem: RH SAFETY Goal: RH STG ADHERE TO SAFETY PRECAUTIONS W/ASSISTANCE/DEVICE STG Adhere to Safety Precautions With mod  Assistance/Device.  Outcome: Not Progressing Is not aware of patient safety. Continues to become agitated throughout the day and wanting to leave to go home.

## 2016-11-07 NOTE — Progress Notes (Signed)
Occupational Therapy Session Note  Patient Details  Name: [name] MRN: [id number] Date of Birth: 12-30-1965  Today's Date: 11/07/2016 OT Individual Time: 0700-0800 OT Individual Time Calculation (min): 60 min    Short Term Goals: Week 4:  OT Short Term Goal 1 (Week 4): Pt will demonstrate improved orientation to identify that she is in a hospital with mod verbal cues OT Short Term Goal 2 (Week 4): Pt will initiate dressing tasks with min verbal cues when presented with clothing OT Short Term Goal 3 (Week 4): Pt will attend to bathing tasks for 5 minutes with min verbal cues   Skilled Therapeutic Interventions/Progress Updates:    Pt resting in chair with RN present.  RN informed therapist that pt fell prior to beginning of session.  Pt initially engaged in retrieving coffee, making coffee with Keurig, and eating breakfast.  Pt assisted with replenishing supply of K-cups. Pt required tot A for orientation to place, year, situation.  Pt required reorienting X 5 during session.  Pt amb back to room and changed shirt.  Pt requested to discharge home ([place]) X 6 but easily redirected with change of topics.  Pt initially acknowledged that I was one of her therapists but during session, pt mistakenly identified therapist as a childhood friend.  Pt returned to gerichair with tray in place, returned to nursing station, and provided with word search puzzles.  Therapy Documentation Precautions:  Precautions Precautions: Cervical, Fall, Other (comment) Precaution Comments: abdominal incision Required Braces or Orthoses: Cervical Brace Cervical Brace: Hard collar, At all times Restrictions Weight Bearing Restrictions: No Pain: Pain Assessment Pain Assessment: No/denies pain  See Function Navigator for Current Functional Status.   Therapy/Group: Individual Therapy  Leroy Libman 11/07/2016, 9:56 AM
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
## 2016-11-07 NOTE — Progress Notes (Signed)
Physical Therapy Note  Patient Details  Name: Jacqueline [name] MRN: [id number] Date of Birth: [birth year] Today's Date: 11/07/2016    Time: 1018-1045 42 minutes  1:1 No c/o pain. Session focused on attention and awareness goals with pt able to attend to nustep activity x 10 minutes with mod cuing.  Attempted to have pt perform puzzle task but pt unable to attend, easily internally and externally distracted throughout task, needs max cuing and redirection to focus on task and not disturb other patients.   [name] 11/07/2016, 11:00 AM

## 2016-11-07 NOTE — Progress Notes (Signed)
MD Silvestre Mesi notified of patient's fall via telephone. Patient stable and denies any pain or discomfort. Informed MD of small skin tear to right elbow with HR 103 otherwise VS stable. MD stated to continue to monitor and assess for any new changes in condition. MD will round when MD gets out of CPR class.

## 2016-11-08 ENCOUNTER — Inpatient Hospital Stay (HOSPITAL_COMMUNITY): Payer: Medicaid Other | Admitting: Physical Therapy

## 2016-11-08 ENCOUNTER — Inpatient Hospital Stay (HOSPITAL_COMMUNITY): Payer: Medicaid Other | Admitting: Speech Pathology

## 2016-11-08 ENCOUNTER — Inpatient Hospital Stay (HOSPITAL_COMMUNITY): Payer: Self-pay

## 2016-11-08 ENCOUNTER — Inpatient Hospital Stay (HOSPITAL_COMMUNITY): Payer: Medicaid Other | Admitting: Occupational Therapy

## 2016-11-08 MED ORDER — RISPERIDONE 1 MG PO TABS
1.0000 mg | ORAL_TABLET | Freq: Two times a day (BID) | ORAL | Status: DC
  Administered 2016-11-08 – 2016-11-14 (×12): 1 mg via ORAL
  Filled 2016-11-08 (×13): qty 1
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
## 2016-11-08 NOTE — Progress Notes (Signed)
Occupational Therapy Session Note  Patient Details  Name: Jacqueline [name] MRN: [id number] Date of Birth: 12-08-1965  Today's Date: 11/08/2016 OT Individual Time: 1410-1445 OT Individual Time Calculation (min): 35 min    Short Term Goals: Week 2:  OT Short Term Goal 1 (Week 2): Pt will demonstrate improved praxis by using deoderant in the correct manner with min verbal cues OT Short Term Goal 1 - Progress (Week 2): Met OT Short Term Goal 2 (Week 2): Pt will attend to self care task for at least 3 minutes without redirection.  OT Short Term Goal 2 - Progress (Week 2): Met OT Short Term Goal 3 (Week 2): Pt will demonstrate improved orientation to identify that she is in a hospital with mod verbal cues OT Short Term Goal 3 - Progress (Week 2): Progressing toward goal Week 3:  OT Short Term Goal 1 (Week 3): Pt will demonstrate improved orientation to identify that she is in a hospital with mod verbal cues OT Short Term Goal 1 - Progress (Week 3): Progressing toward goal OT Short Term Goal 2 (Week 3): Pt will initiate dressing tasks with min verbal cues when presented with clothing OT Short Term Goal 2 - Progress (Week 3): Progressing toward goal OT Short Term Goal 3 (Week 3): Pt will attend to bathing tasks for 5 minutes with min verbal cues  OT Short Term Goal 3 - Progress (Week 3): Progressing toward goal Week 4:  OT Short Term Goal 1 (Week 4): Pt will demonstrate improved orientation to identify that she is in a hospital with mod verbal cues OT Short Term Goal 2 (Week 4): Pt will initiate dressing tasks with min verbal cues when presented with clothing OT Short Term Goal 3 (Week 4): Pt will attend to bathing tasks for 5 minutes with min verbal cues   Skilled Therapeutic Interventions/Progress Updates:    Pt seen this session to attempt to facilitate attention and initiation to bathe.  Pt received in room from handoff from nursing. Pt requested to toilet. In bathroom, multiple attempts made  to have pt shower. Pt stating she was too tired.  Pt very disoriented thinking I was a friend from home, that she was at another friends house and repeatedly asking to look for someone. Pt refusing to shower. She ambulated out to nursing station trying to talk to numerous staff to find "Caitlin".  To divert patient, asked her to fill out some forms in the day room. Worked on orientation skills with filling out form with max cues. Pt with continuing frustration, not being able to find [place]. Asked pt to copy my drawing of a house. This distracted her well as she continued drawing for several minutes adding embellishments to the picture.  Pt ambulated back to room and family had arrived. Nursing with patient.  Therapy Documentation Precautions:  Precautions Precautions: Cervical, Fall, Other (comment) Precaution Comments: abdominal incision Required Braces or Orthoses: Cervical Brace Cervical Brace: Hard collar, At all times Restrictions Weight Bearing Restrictions: No    Vital Signs: Therapy Vitals Temp: 98.2 F (36.8 C) Temp Source: Oral Pulse Rate: 95 Resp: 20 BP: (!) 100/56 Patient Position (if appropriate): Sitting Oxygen Therapy SpO2: 98 % O2 Device: Not Delivered Pain: no c/o pain   ADL: ADL ADL Comments: refer to functional navigator  See Function Navigator for Current Functional Status.   Therapy/Group: Individual Therapy  [employer] 11/08/2016, 9:54 AM

## 2016-11-08 NOTE — Progress Notes (Signed)
   11/08/16 [id number]  What Happened  Was fall witnessed? Yes  Who witnessed fall? Dauda, RN and nursing instructor  Patients activity before fall ambulating-unassisted  Point of contact buttocks  Was patient injured? No  Follow Up  MD notified Silvestre Mesi  Time MD notified [phone]  Family notified Yes-comment Rae Mar, mother)  Time family notified [phone]  Additional tests No  Adult Fall Risk Assessment  Risk Factor Category (scoring not indicated) Fall has occurred during this admission (document High fall risk)  Age 51  Fall History: Fall within 6 months prior to admission 5  Elimination; Bowel and/or Urine Incontinence 0  Elimination; Bowel and/or Urine Urgency/Frequency 0  Medications: includes PCA/Opiates, Anti-convulsants, Anti-hypertensives, Diuretics, Hypnotics, Laxatives, Sedatives, and Psychotropics 5  Patient Care Equipment 0  Mobility-Assistance 2  Mobility-Gait 2  Mobility-Sensory Deficit 0  Altered awareness of immediate physical environment 1  Impulsiveness 2  Lack of understanding of one's physical/cognitive limitations 4  Total Score 21  Patient's Fall Risk High Fall Risk (>13 points)  Adult Fall Risk Interventions  Required Bundle Interventions *See Row Information* High fall risk - low, moderate, and high requirements implemented  Additional Interventions Family Supervision;Individualized elimination schedule;Lap belt while in chair/wheelchair;Pharmacy review of medications;PT/OT need assessed if change in mobility from baseline;Reorient/diversional activities with confused patients;Room near nurses station;Safety Sitter/Safety Rounder;Use of appropriate toileting equipment (bedpan, BSC, etc.)  Screening for Fall Injury Risk  Risk For Fall Injury- See Row Information  F  Injury Prevention Interventions Family supervision;Specialty Low Bed (enclosure bed)

## 2016-11-08 NOTE — Progress Notes (Signed)
Occupational Therapy Session Note  Patient Details  Name: Jacqueline [name] MRN: [id number] Date of Birth: 1966/08/26  Today's Date: 11/08/2016 OT Individual Time: 0700-0800 OT Individual Time Calculation (min): 60 min    Short Term Goals: Week 4:  OT Short Term Goal 1 (Week 4): Pt will demonstrate improved orientation to identify that she is in a hospital with mod verbal cues OT Short Term Goal 2 (Week 4): Pt will initiate dressing tasks with min verbal cues when presented with clothing OT Short Term Goal 3 (Week 4): Pt will attend to bathing tasks for 5 minutes with min verbal cues   Skilled Therapeutic Interventions/Progress Updates:    Pt resting in chair upon arrival.  Pt initially agreed to taking a shower but became distracted and unable to refocus on showering, explaining that she had already taken a shower earlier.  Pt became argumentative when corrected.  Pt transitioned to eating breakfast but would only eat 4 or 5 bites before stating that she was "full."  Pt amb to therapy gym and engaged in therex on NuStep with focus on steps per minute.  Pt requested therapist to also use NuStep so she could compare who completed most steps.  Pt required tot A for orientation to place, date, and situation.  Pt returned to room with NT present.   Therapy Documentation Precautions:  Precautions Precautions: Cervical, Fall, Other (comment) Precaution Comments: abdominal incision Required Braces or Orthoses: Cervical Brace Cervical Brace: Hard collar, At all times Restrictions Weight Bearing Restrictions: No Pain:  Pt denied pain  See Function Navigator for Current Functional Status.   Therapy/Group: Individual Therapy  Leroy Libman 11/08/2016, 12:13 PM

## 2016-11-08 NOTE — Progress Notes (Signed)
Physical Therapy Session Note  Patient Details  Name: Jacqueline [name] MRN: [id number] Date of Birth: 11-06-1965  Today's Date: 11/07/2016 PT Individual Time:[phone] PT Individual Time Calculation: 49mn      Short Term Goals: Week 4:  PT Short Term Goal 1 (Week 4): Pt will be oriented to location with min cuing. PT Short Term Goal 1 - Progress (Week 4): Met PT Short Term Goal 2 (Week 4): Pt will negotiate 12 steps with supervision for strengthening with PT. PT Short Term Goal 2 - Progress (Week 4): Met PT Short Term Goal 3 (Week 4): Pt will demonstrate intellectual awareness with moderate cuing. PT Short Term Goal 3 - Progress (Week 4): Progressing toward goal Week 5:     Skilled Therapeutic Interventions/Progress Updates:    Session focused on sustained attention and short term retention of new information. Gait through rehab unit to locate checkers game with problem solving to open cabinet. Pt instructed patient in rules of Checkers. Patient able to maintain purpose of game and proper application of rules with min-moderate cues for 25 minutes. Last 8 minutes patient required max cues to remain on task and to retain and recall rules of game.  PT hand off to additional PT at end of session.      Therapy Documentation Precautions:  Precautions Precautions: Cervical, Fall, Other (comment) Precaution Comments: abdominal incision Required Braces or Orthoses: Cervical Brace Cervical Brace: Hard collar, At all times Restrictions Weight Bearing Restrictions: No Pain:   0/10   See Function Navigator for Current Functional Status.   Therapy/Group: Individual Therapy  ALorie Phenix2/10/2016, 6:12 AM
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
## 2016-11-08 NOTE — Progress Notes (Signed)
Physical Therapy Session Note  Patient Details  Name: Jacqueline [name] MRN: [id number] Date of Birth: 1966/05/13  Today's Date: 11/08/2016 PT Individual Time: 0906-1016 PT Individual Time Calculation (min): 70 min   Skilled Therapeutic Interventions/Progress Updates:    Pt without c/o or behaviors demonstrating pain. Session focused on cognitive remediation & functional mobility. Pt engaged in crossword puzzles with picture clues, decoding simple messages, and playing tic-tac-toe. Pt requires cuing to initiate task, heavy instructional cuing, and assistance to recall names of pictures on crosswords. Pt requires max cuing to switch attention to other tasks as she will perseverate throughout session. Pt able to recall location and date 80% of the time throughout session, and required cuing to recall situation. Pt with emerging intellectual awareness with mod/max cuing during session. Pt negotiated 24 steps (6" + 3") with B rails & supervision and utilized cybex kinetron in sitting for BLE strengthening. Pt reported need to use restroom and had continent void on toilet, completed toilet transfers & hand hygiene with supervision assist. At end of session pt left sitting in recliner in room with NT present.  Pt required max cuing to recall current age.   Therapy Documentation Precautions:  Precautions Precautions: Cervical, Fall, Other (comment) Precaution Comments: abdominal incision Required Braces or Orthoses: Cervical Brace Cervical Brace: Hard collar, At all times Restrictions Weight Bearing Restrictions: No   See Function Navigator for Current Functional Status.   Therapy/Group: Individual Therapy  [name] 11/08/2016, 10:50 AM

## 2016-11-08 NOTE — Progress Notes (Signed)
Speech Language Pathology Daily Session Note  Patient Details  Name: Jacqueline [name] MRN: [id number] Date of Birth: 18-Oct-1965  Today's Date: 11/08/2016 SLP Individual Time: 1110-1205 SLP Individual Time Calculation (min): 55 min  Short Term Goals: Week 5: SLP Short Term Goal 1 (Week 5): Patient will demonstrate sustained attention to a task for 10 minutes with Max A multimodal cues.  SLP Short Term Goal 2 (Week 5): Patient will orient to place and situation with Mod A multimodal cues.  SLP Short Term Goal 3 (Week 5): Patient will demonstrate basic problem solving for self-care tasks with Mod A multimodal cues.  SLP Short Term Goal 4 (Week 5): Patient will consume trials of regular textures and demonstrate efficient mastication with complete oral clearance and without overt s/s of aspiration with Mod A verbal cues over 2 sessions prior to upgrade.  SLP Short Term Goal 5 (Week 5): Patient will identify 1 cognitive and 1 physical deficit with Mod A multimodal cues.   Skilled Therapeutic Interventions: Skilled treatment session focused on cognitive goals. SLP facilitated session by providing Max A verbal cues for utilization of memory notebook for orientation to time and place. Patient verbose with language of confusion and required Max A verbal cues for sustained attention to self-feeding for ~30 second increments. Patient left upright in [place] with all needs within reach. Continue with current plan of care.      Function:  Eating Eating   Modified Consistency Diet: Yes Eating Assist Level: More than reasonable amount of time;Supervision or verbal cues           Cognition Comprehension Comprehension assist level: Understands basic 75 - 89% of the time/ requires cueing 10 - 24% of the time  Expression   Expression assist level: Expresses basic 75 - 89% of the time/requires cueing 10 - 24% of the time. Needs helper to occlude trach/needs to repeat words.  Social Interaction Social  Interaction assist level: Interacts appropriately 25 - 49% of time - Needs frequent redirection.  Problem Solving Problem solving assist level: Solves basic 25 - 49% of the time - needs direction more than half the time to initiate, plan or complete simple activities  Memory Memory assist level: Recognizes or recalls 25 - 49% of the time/requires cueing 50 - 75% of the time    Pain No/Denies Pain   Therapy/Group: Individual Therapy  Slayde Brault 11/08/2016, 4:00 PM

## 2016-11-08 NOTE — Progress Notes (Signed)
Jacqueline [name], mother, notified of witnessed fall with no injuries. Nurse inquired with mother about possible alternatives to keep patient from falling. Nurse suggested family members come in to sit with patient as patient's anxiety tends to de-escalate of wanting to go home. Mother concerned as patient has no family to sit but will think of possible people and solutions. Mother thankful once again of informing mother of fall.

## 2016-11-08 NOTE — Progress Notes (Signed)
   11/08/16 [id number]  What Happened  Was fall witnessed? Yes  Who witnessed fall? Dauda, RN and nursing instructor  Patients activity before fall ambulating-unassisted  Point of contact buttocks  Was patient injured? No  Follow Up  MD notified Silvestre Mesi  Time MD notified [phone]  Family notified Yes-comment Rae Mar, mother)  Time family notified [phone]  Additional tests No

## 2016-11-09 ENCOUNTER — Inpatient Hospital Stay (HOSPITAL_COMMUNITY): Payer: Self-pay

## 2016-11-09 ENCOUNTER — Inpatient Hospital Stay (HOSPITAL_COMMUNITY): Payer: Self-pay | Admitting: Occupational Therapy

## 2016-11-09 ENCOUNTER — Inpatient Hospital Stay (HOSPITAL_COMMUNITY): Payer: Self-pay | Admitting: Physical Therapy

## 2016-11-09 NOTE — Progress Notes (Signed)
Occupational Therapy Session Note  Patient Details  Name: Jacqueline [name] MRN: [id number] Date of Birth: 21-Dec-1965  Today's Date: 11/09/2016 OT Individual Time: 1015-1045 OT Individual Time Calculation (min): 30 min    Short Term Goals: Week 1:  OT Short Term Goal 1 (Week 1): Pt will demonstrate improved praxis by using deoderant in the correct manner. OT Short Term Goal 1 - Progress (Week 1): Progressing toward goal OT Short Term Goal 2 (Week 1): Pt will demonstrate improved sitting balance EOB with S while engaging in UB self care. OT Short Term Goal 2 - Progress (Week 1): Met OT Short Term Goal 3 (Week 1): Pt will demonstrate improved standing balance of min A while engaging in LB self care. OT Short Term Goal 3 - Progress (Week 1): Met OT Short Term Goal 4 (Week 1): Pt will attend to self care task for at least 3 minutes without redirection.  OT Short Term Goal 4 - Progress (Week 1): Progressing toward goal OT Short Term Goal 5 (Week 1): Pt will demonstrate improved orientation to identify that she is in a hospital. OT Short Term Goal 5 - Progress (Week 1): Progressing toward goal Week 2:  OT Short Term Goal 1 (Week 2): Pt will demonstrate improved praxis by using deoderant in the correct manner with min verbal cues OT Short Term Goal 1 - Progress (Week 2): Met OT Short Term Goal 2 (Week 2): Pt will attend to self care task for at least 3 minutes without redirection.  OT Short Term Goal 2 - Progress (Week 2): Met OT Short Term Goal 3 (Week 2): Pt will demonstrate improved orientation to identify that she is in a hospital with mod verbal cues OT Short Term Goal 3 - Progress (Week 2): Progressing toward goal Week 3:  OT Short Term Goal 1 (Week 3): Pt will demonstrate improved orientation to identify that she is in a hospital with mod verbal cues OT Short Term Goal 1 - Progress (Week 3): Progressing toward goal OT Short Term Goal 2 (Week 3): Pt will initiate dressing tasks with min  verbal cues when presented with clothing OT Short Term Goal 2 - Progress (Week 3): Progressing toward goal OT Short Term Goal 3 (Week 3): Pt will attend to bathing tasks for 5 minutes with min verbal cues  OT Short Term Goal 3 - Progress (Week 3): Progressing toward goal Week 4:  OT Short Term Goal 1 (Week 4): Pt will demonstrate improved orientation to identify that she is in a hospital with mod verbal cues OT Short Term Goal 1 - Progress (Week 4): Progressing toward goal OT Short Term Goal 2 (Week 4): Pt will initiate dressing tasks with min verbal cues when presented with clothing OT Short Term Goal 2 - Progress (Week 4): Met OT Short Term Goal 3 (Week 4): Pt will attend to bathing tasks for 5 minutes with min verbal cues  OT Short Term Goal 3 - Progress (Week 4): Progressing toward goal Week 5:  OT Short Term Goal 1 (Week 5): Pt will demonstrate improved orientation to identify that she is in a hospital with mod verbal cues OT Short Term Goal 2 (Week 5): Pt will attend to bathing tasks for 5 minutes with min verbal cues  OT Short Term Goal 3 (Week 5): Pt will initiate use of Memory Book to assist with recall of daily events with max verbal cues  Skilled Therapeutic Interventions/Progress Updates:    Pt seen this session for  cognitive tasks of attention, initiation, problem solving, safety awareness with self care.  With a great deal of coaxing, pt ambulated to bathroom to doff clothing and shower. Walked out to chair in room to dress. Pt was very lethargic and therefore her balance was more impaired today. She had over 7 LOB as she stood and moved around with min to mod A to recover balance each time.  Pt would say "whoa" each time, but continued to move very impulsively.  She would attend to bathing for at least 5 min but needed cues to cleanse thoroughly. Pt very distracted by feeling cold even though room and water was warm.  Pt's nurse tech returned to A with patient.  Therapy  Documentation Precautions:  Precautions Precautions: Cervical, Fall, Other (comment) Precaution Comments: abdominal incision Required Braces or Orthoses: Cervical Brace Cervical Brace: Hard collar, At all times Restrictions Weight Bearing Restrictions: No    Pain: Pain Assessment Pain Assessment: No/denies pain ADL: ADL ADL Comments: refer to functional navigator  See Function Navigator for Current Functional Status.   Therapy/Group: Individual Therapy  Louann Hopson 11/09/2016, 11:11 AM

## 2016-11-09 NOTE — Progress Notes (Signed)
Speech Language Pathology Daily Session Note  Patient Details  Name: Jacqueline [name] MRN: [id number] Date of Birth: 09/17/1966  Today's Date: 11/09/2016 SLP Concurrent Time: 1315-1400 SLP Concurrent Time Calculation (min): 45 min   Short Term Goals: Week 5: SLP Short Term Goal 1 (Week 5): Patient will demonstrate sustained attention to a task for 10 minutes with Max A multimodal cues.  SLP Short Term Goal 2 (Week 5): Patient will orient to place and situation with Mod A multimodal cues.  SLP Short Term Goal 3 (Week 5): Patient will demonstrate basic problem solving for self-care tasks with Mod A multimodal cues.  SLP Short Term Goal 4 (Week 5): Patient will consume trials of regular textures and demonstrate efficient mastication with complete oral clearance and without overt s/s of aspiration with Mod A verbal cues over 2 sessions prior to upgrade.  SLP Short Term Goal 5 (Week 5): Patient will identify 1 cognitive and 1 physical deficit with Mod A multimodal cues.   Skilled Therapeutic Interventions: Skilled treatment session focused on cognitive goals. SLP facilitated session by providing Max A verbal cues for problem solving with a novel task, Mod A verbal cues for sustained attention to task in a minimally distracting environment and Min A verbal cues for appropriate social interaction in regards to topic maintenance and turn taking with task and an unfamiliar partner. Patient ambulated to and from room with supervision. Patient left upright in chair with NT present. Continue with current plan of care.       Function:  Cognition Comprehension Comprehension assist level: Understands basic 75 - 89% of the time/ requires cueing 10 - 24% of the time  Expression   Expression assist level: Expresses basic 75 - 89% of the time/requires cueing 10 - 24% of the time. Needs helper to occlude trach/needs to repeat words.  Social Interaction Social Interaction assist level: Interacts appropriately 25  - 49% of time - Needs frequent redirection.  Problem Solving Problem solving assist level: Solves basic 25 - 49% of the time - needs direction more than half the time to initiate, plan or complete simple activities  Memory Memory assist level: Recognizes or recalls 25 - 49% of the time/requires cueing 50 - 75% of the time    Pain Pain Assessment Pain Assessment: No/denies pain Faces Pain Scale: No hurt  Therapy/Group: Individual Therapy  Samaj Wessells 11/09/2016, 3:18 PM
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
## 2016-11-09 NOTE — Progress Notes (Signed)
Physical Therapy Session Note  Patient Details  Name: Jacqueline [name] MRN: [id number] Date of Birth: [birth year]  Today's Date: 11/09/2016 PT Individual Time: 1601-1630 PT Individual Time Calculation (min): 29 min   Short Term Goals: Week 4:  PT Short Term Goal 1 (Week 4): Pt will be oriented to location with min cuing. PT Short Term Goal 1 - Progress (Week 4): Met PT Short Term Goal 2 (Week 4): Pt will negotiate 12 steps with supervision for strengthening with PT. PT Short Term Goal 2 - Progress (Week 4): Met PT Short Term Goal 3 (Week 4): Pt will demonstrate intellectual awareness with moderate cuing. PT Short Term Goal 3 - Progress (Week 4): Progressing toward goal  Skilled Therapeutic Interventions/Progress Updates:     Pt received sitting in WC and agreeable to PT  Gait in hall with supervision assist. One near LOB while stepping around obstacles in day room.   Pt requests to use restroom. Toilet transfer with distant supervision assist and patient demonstrated improved awareness and performed hand hygiene with   PT re-introduced patient to memory book. Patient re-oriented to situation and time. Patient able to recall at end of session that she is in a hospital in [place], but unable to remember why or name of hospital.   PT instructed patient in Connect four processing game. Patient able to attend yto task for 64mnutes and demonstrated appropriate anticipatory problem solving throughout 2 rounds of game.   Patient returned too room and left sitting in geri chair with, brother present, call bell in reach and all needs met.     Therapy Documentation Precautions:  Precautions Precautions: Cervical, Fall, Other (comment) Precaution Comments: abdominal incision Required Braces or Orthoses: Cervical Brace Cervical Brace: Hard collar, At all times Restrictions Weight Bearing Restrictions: No Vital Signs: Therapy Vitals Temp: 97.9 F (36.6 C) Temp Source: Oral Pulse  Rate: 92 Resp: 20 BP: 103/61 Patient Position (if appropriate): Sitting Oxygen Therapy SpO2: 100 % O2 Device: Not Delivered   See Function Navigator for Current Functional Status.   Therapy/Group: Individual Therapy  ALorie Phenix2/11/2016, 5:45 PM

## 2016-11-09 NOTE — Progress Notes (Signed)
Occupational Therapy Session Note  Patient Details  Name: Jacqueline [name] MRN: [id number] Date of Birth: 1966-05-30  Today's Date: 11/09/2016 OT Individual Time: 0700-0800 OT Individual Time Calculation (min): 60 min    Short Term Goals: Week 5:  OT Short Term Goal 1 (Week 5): Pt will demonstrate improved orientation to identify that she is in a hospital with mod verbal cues OT Short Term Goal 2 (Week 5): Pt will attend to bathing tasks for 5 minutes with min verbal cues  OT Short Term Goal 3 (Week 5): Pt will initiate use of Memory Book to assist with recall of daily events with max verbal cues  Skilled Therapeutic Interventions/Progress Updates:    Pt resting in chair upon arrival with NT present.  Focus of OT session on continued orientation, task initiation, sequencing, attention to task, and safety awareness.  Pt initially agreed that she "probably" needed a shower but was resistant to engaging in shower.  Pt oriented to city and hospital.  Pt was able to choose the correct year from a field of two.  Pt perseverated on going home but was easily redirected this morning.  Pt stood at sink to brush teeth.  Pt amb to gym and engaged in 6 mins of NuStep while engaging in conversation with therapist.  Pt returned to room and remained in gerichair with NT present.   Therapy Documentation Precautions:  Precautions Precautions: Cervical, Fall, Other (comment) Precaution Comments: abdominal incision Required Braces or Orthoses: Cervical Brace Cervical Brace: Hard collar, At all times Restrictions Weight Bearing Restrictions: No Pain:  Pt denied pain ADL: ADL ADL Comments: refer to functional navigator  See Function Navigator for Current Functional Status.   Therapy/Group: Individual Therapy  Leroy Libman 11/09/2016, 8:01 AM
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
## 2016-11-09 NOTE — Progress Notes (Signed)
Occupational Therapy Session Note  Patient Details  Name: Jacqueline [name] MRN: [id number] Date of Birth: 03/12/1966  Today's Date: 11/09/2016 OT Individual Time: 1115-1145 OT Individual Time Calculation (min): 30 min    Short Term Goals: Week 4:  OT Short Term Goal 1 (Week 4): Pt will demonstrate improved orientation to identify that she is in a hospital with mod verbal cues OT Short Term Goal 1 - Progress (Week 4): Progressing toward goal OT Short Term Goal 2 (Week 4): Pt will initiate dressing tasks with min verbal cues when presented with clothing OT Short Term Goal 2 - Progress (Week 4): Met OT Short Term Goal 3 (Week 4): Pt will attend to bathing tasks for 5 minutes with min verbal cues  OT Short Term Goal 3 - Progress (Week 4): Progressing toward goal  Skilled Therapeutic Interventions/Progress Updates: Patient  Scene for cognitive activities (i.e. Orientation to time, place, and situation, focus, anticipatory awareness, and short term memory) while complete bilateral UE coordination and UE and LE reciprocal movements while standing.    Patient with only 1 slight loss of balance posteriorly  during the session but was able to right herself.    Patient handed back off to nursing tech at the end of the session.     Therapy Documentation Precautions:  Precautions Precautions: Cervical, Fall, Other (comment) Precaution Comments: abdominal incision Required Braces or Orthoses: Cervical Brace Cervical Brace: Hard collar, At all times Restrictions Weight Bearing Restrictions: No    Pain: Pain Assessment Pain Assessment: No/denies pain Faces Pain Scale: No hurt  See Function Navigator for Current Functional Status.   Therapy/Group: Individual Therapy  Herschell Dimes 11/09/2016, 1:31 PM

## 2016-11-09 NOTE — Progress Notes (Signed)
Occupational Therapy Weekly Progress Note  Patient Details  Name: Jacqueline [name] MRN: [id number] Date of Birth: Jan 11, 1966  Beginning of progress report period: November 02, 2016 End of progress report period: November 09, 2016  Patient has met 1 of 3 short term goals.  Pt has made minimal progress towards STGs this past week.  Pt will initiate changing clothing with min verbal cues when presented with new clothing but continues to decline bathing tasks, stating that she already took a shower.  On two occasions, pt agreed to take a shower but declined prior to entering shower.  Pt continues to require tot A for consistent orientation to place, date, or situation.  Pt's conversation is extremely tangential and consistently not based on current reality.  Pt continues to exhibit behaviors consistent with [place].  Patient continues to demonstrate the following deficits: decreased initiation, decreased attention, decreased awareness, decreased problem solving, decreased safety awareness, decreased memory and delayed processing and decreased standing balance, decreased postural control and decreased balance strategies and therefore will continue to benefit from skilled OT intervention to enhance overall performance with BADL.  Patient progressing toward long term goals.(slowly).  Continue plan of care.  OT Short Term Goals Week 4:  OT Short Term Goal 1 (Week 4): Pt will demonstrate improved orientation to identify that she is in a hospital with mod verbal cues OT Short Term Goal 1 - Progress (Week 4): Progressing toward goal OT Short Term Goal 2 (Week 4): Pt will initiate dressing tasks with min verbal cues when presented with clothing OT Short Term Goal 2 - Progress (Week 4): Met OT Short Term Goal 3 (Week 4): Pt will attend to bathing tasks for 5 minutes with min verbal cues  OT Short Term Goal 3 - Progress (Week 4): Progressing toward goal Week 5:  OT Short Term Goal 1 (Week 5): Pt will  demonstrate improved orientation to identify that she is in a hospital with mod verbal cues OT Short Term Goal 2 (Week 5): Pt will attend to bathing tasks for 5 minutes with min verbal cues  OT Short Term Goal 3 (Week 5): Pt will initiate use of Memory Book to assist with recall of daily events with max verbal cues     Therapy Documentation Precautions:  Precautions Precautions: Cervical, Fall, Other (comment) Precaution Comments: abdominal incision Required Braces or Orthoses: Cervical Brace Cervical Brace: Hard collar, At all times Restrictions Weight Bearing Restrictions: No  See Function Navigator for Current Functional Status.    [name] [facility] 11/09/2016, 6:53 AM

## 2016-11-10 ENCOUNTER — Inpatient Hospital Stay (HOSPITAL_COMMUNITY): Payer: Medicaid Other | Admitting: Occupational Therapy

## 2016-11-10 DIAGNOSIS — F1910 Other psychoactive substance abuse, uncomplicated: Secondary | ICD-10-CM

## 2016-11-10 NOTE — Progress Notes (Signed)
Occupational Therapy Session Note  Patient Details  Name: Jacqueline [name] MRN: [id number] Date of Birth: May 15, 1966  Today's Date: 11/10/2016 OT Individual Time:  1400- 90   Skilled Intervention: 70 minutes   Short Term Goals: Week 4:  OT Short Term Goal 1 (Week 4): Pt will demonstrate improved orientation to identify that she is in a hospital with mod verbal cues OT Short Term Goal 1 - Progress (Week 4): Progressing toward goal OT Short Term Goal 2 (Week 4): Pt will initiate dressing tasks with min verbal cues when presented with clothing OT Short Term Goal 2 - Progress (Week 4): Met OT Short Term Goal 3 (Week 4): Pt will attend to bathing tasks for 5 minutes with min verbal cues  OT Short Term Goal 3 - Progress (Week 4): Progressing toward goal  Skilled Therapeutic Interventions/Progress Updates:   Pt received in nurses session in [place]. Pt with no c/o, signs, or symptoms of pain this session. Once in room, pt required max cues and with use of memory book for orientation to place and time. Pt perseverating throughout session about leaving hospital and "checking out AMA". OT and RN provided education to pt in regards of current situation and pt not discharging today. Pt ambulated to bathroom with steady assistance for toileting with supervision for clothing management and hygiene. Pt performed hand hygiene without cues to do so. Pt refuses shower this session and states, "I took one this morning." Pt required max cues and redirection throughout session as she continues to state she is leaving. Pt agreeable to allow therapist to assist her with tangled hair and pt standing at sink for grooming tasks with mod cues for initiation and attention. Pt returning to gerichair at end of session and returned to RN station for safety.   Therapy Documentation Precautions:  Precautions Precautions: Cervical, Fall, Other (comment) Precaution Comments: abdominal incision Required Braces or Orthoses:  Cervical Brace Cervical Brace: Hard collar, At all times Restrictions Weight Bearing Restrictions: No General:   Vital Signs:   Pain:   ADL: ADL ADL Comments: refer to functional navigator Exercises:   Other Treatments:    See Function Navigator for Current Functional Status.   Therapy/Group: Individual Therapy  Gypsy Decant 11/10/2016, 8:53 PM

## 2016-11-10 NOTE — Progress Notes (Signed)
[place] PHYSICAL MEDICINE & REHABILITATION     PROGRESS NOTE    Subjective/Complaints: Patient states she wants to go home. Per nursing generally less agitated, but still confused and with impulsivity and reduced judgment  ROS: Limited due cognitive/behavioral      Objective: Vital Signs: Blood pressure 101/65, pulse 92, temperature 98.4 F (36.9 C), temperature source Oral, resp. rate 18, weight 81.5 kg (179 lb 10.8 oz), SpO2 94 %. No results found. No results for input(s): WBC, HGB, HCT, PLT in the last 72 hours. No results for input(s): NA, K, CL, GLUCOSE, BUN, CREATININE, CALCIUM in the last 72 hours.  Invalid input(s): CO CBG (last 3)  No results for input(s): GLUCAP in the last 72 hours.  Wt Readings from Last 3 Encounters:  11/07/16 81.5 kg (179 lb 10.8 oz)  10/09/16 71.4 kg (157 lb 4.8 oz)  08/03/16 93 kg (205 lb)    Physical Exam:  Gen: NAD. Vital signs reviewed Eyes: No discharge. EOMI. Cardiovascular: RRR Respiratory: CTA B GI: Soft, non-distended. Abdominal incision with bed of pink granulation which is shrinking  Musc: No edema, no tenderness Neurological: She is alert.  Motor: moves all 4 ext Skin: dry  Psych: confused, oriented to hospital, person  Assessment/Plan: 1. Functional, cognitive and behavioral deficits secondary to severe TBI which require 3+ hours per day of interdisciplinary therapy in a comprehensive inpatient rehab setting. Physiatrist is providing close team supervision and 24 hour management of active medical problems listed below. Physiatrist and rehab team continue to assess barriers to discharge/monitor patient progress toward functional and medical goals.  Function:  Bathing Bathing position Bathing activity did not occur: Refused Position: Production manager parts bathed by patient: Right arm, Left arm, Chest, Front perineal area, Buttocks, Right upper leg, Left upper leg, Right lower leg, Left lower leg, Back Body  parts bathed by helper: Right arm, Left arm, Front perineal area, Buttocks, Right upper leg, Left upper leg, Right lower leg, Left lower leg, Back  Bathing assist Assist Level: Supervision or verbal cues (max  cues)      Upper Body Dressing/Undressing Upper body dressing   What is the patient wearing?: Pull over shirt/dress     Pull over shirt/dress - Perfomed by patient: Thread/unthread right sleeve, Thread/unthread left sleeve, Put head through opening, Pull shirt over trunk Pull over shirt/dress - Perfomed by helper: Thread/unthread right sleeve, Thread/unthread left sleeve, Put head through opening, Pull shirt over trunk        Upper body assist Assist Level: Supervision or verbal cues      Lower Body Dressing/Undressing Lower body dressing   What is the patient wearing?: Pants Underwear - Performed by patient: Thread/unthread right underwear leg, Thread/unthread left underwear leg, Pull underwear up/down   Pants- Performed by patient: Thread/unthread right pants leg, Thread/unthread left pants leg, Pull pants up/down Pants- Performed by helper: Pull pants up/down Non-skid slipper socks- Performed by patient: Don/doff right sock, Don/doff left sock Non-skid slipper socks- Performed by helper: Don/doff right sock, Don/doff left sock Socks - Performed by patient: Don/doff right sock, Don/doff left sock   Shoes - Performed by patient: Don/doff right shoe, Don/doff left shoe            Lower body assist Assist for lower body dressing: Supervision or verbal cues      Toileting Toileting   Toileting steps completed by patient: Adjust clothing prior to toileting, Performs perineal hygiene, Adjust clothing after toileting Toileting steps completed by helper:  Performs perineal hygiene Toileting Assistive Devices: Grab bar or rail  Toileting assist Assist level: Supervision or verbal cues   Transfers Chair/bed transfer   Chair/bed transfer method: Ambulatory Chair/bed  transfer assist level: No Help, no cues, assistive device, takes more than a reasonable amount of time Chair/bed transfer assistive device: Armrests     Locomotion Ambulation     Max distance: 150 ft Assist level: Supervision or verbal cues   Wheelchair   Type: Manual Max wheelchair distance: 110' Assist Level: Supervision or verbal cues  Cognition Comprehension Comprehension assist level: Understands basic 75 - 89% of the time/ requires cueing 10 - 24% of the time  Expression Expression assist level: Expresses basic 75 - 89% of the time/requires cueing 10 - 24% of the time. Needs helper to occlude trach/needs to repeat words.  Social Interaction Social Interaction assist level: Interacts appropriately 25 - 49% of time - Needs frequent redirection.  Problem Solving Problem solving assist level: Solves basic 25 - 49% of the time - needs direction more than half the time to initiate, plan or complete simple activities  Memory Memory assist level: Recognizes or recalls 25 - 49% of the time/requires cueing 50 - 75% of the time   Medical Problem List and Plan: 1. Decreased functional mobility  secondary to TBI/large right occipital epidural hematoma, mildly displaced right temporal bone fracture,Posterior cervical spine ligamentous injury with cervical collar, multiple rib fractures after walking in front of a bus             -continue CIR             -RLAS V+   -ongoing dcognitive deficits but is showing some improvement 2.  DVT Prophylaxis/Anticoagulation: SCDs. Dopplers negative 3. Pain Management: Hycet as needed 4. Mood/agitation related to traumatic brain injury:   -sleep/wake better in general  -continue risperdal at 2mg  qhs. increased risperdal to 1mg  bid during the day   -changed wellbutrin to celexa 20mg  qhs  -Klonopin  1 mg  BID  - depakote  increased to 750mg  TID  -continue nicotine patch  -   librium 10mg  QHS back   -using environmental cues/mods as possible for  behavior/cognition   -unzipping enclosure bed when possible 5. Neuropsych: This patient is not capable of making decisions on her own behalf.             -poor judgment and awareness 6. Skin/Wound Care: wound clean and continues closing  -Surgery signed off for now. abx completed  -continue wet to dry dressings daily 7. Fluids/Electrolytes/Nutrition:   -potassium 4.1             8. Seizure prophylaxis. Keppra 500 mg twice a day --decreased to 250mg  9. Dysphagia with decreased nutritional storage. Clear liquids with honey thick liquids 10. 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   -decannulated   11. 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 Acute blood loss anemia. hgb 10.4  hgb 9.9 13. Polysubstance abuse. Provide counseling when appropriate 14. Escherichia coli UTI. Keflex completed  LOS (Days) [place] EVALUATION WAS PERFORMED  Charlett Blake, MD 11/10/2016 11:45 AM

## 2016-11-11 ENCOUNTER — Inpatient Hospital Stay (HOSPITAL_COMMUNITY): Payer: Medicaid Other | Admitting: Physical Therapy

## 2016-11-11 NOTE — Progress Notes (Signed)
Physical Therapy Session Note  Patient Details  Name: Jacqueline [name] MRN: [id number] Date of Birth: 10/24/1965  Today's Date: 11/11/2016 PT Individual Time: [id number] PT Individual Time Calculation (min): 55 min   Skilled Therapeutic Interventions/Progress Updates:    Pt received in bathroom in room. Session focused on functional mobility and cognitive remediation. Pt completed toilet transfer, clothing management, and hand hygiene with distant supervision. Pt ambulated room>gym>dayroom>room without AD and distant supervision. Pt with poor safety awareness negotiating around obstacles but able to correct loss of balance without physical assist from therapist. Pt engaged in identifying plastic animals, with correct identification [id number] of the time with max cuing. Attempted to engage pt in pipe tree assembly but pt becoming increasingly internally and externally distracted, singing and talking to other people in gym, despite max cuing & redirection. Transitioned to day room and attempted to engage pt in puzzle assembly but pt required max cuing, redirection, and assistance for problem solving as she continued to be internally distracted. Pt oriented to location ([facility]) 75% of the time during session. At end of session pt left sitting in recliner in room with all needs within reach & sheet over door.   Pt without behaviors or c/o pain during session.   Unable to locate pt's memory journal during session.  Therapy Documentation Precautions:  Precautions Precautions: Cervical, Fall, Other (comment) Precaution Comments: abdominal incision Required Braces or Orthoses: Cervical Brace Cervical Brace: Hard collar, At all times Restrictions Weight Bearing Restrictions: No  See Function Navigator for Current Functional Status.   Therapy/Group: Individual Therapy  [name] 11/11/2016, 2:36 PM

## 2016-11-11 NOTE — Progress Notes (Signed)
Patient was not in restraint during this shift. In geri-chair at nurse's station or in room either in geri-chair, regular chair, or in bed unzipped. Patient walked around in room, left room several times but was redirected back to room. Voiced desire to leave repeatedly, but did not attempt to leave unit today. Had visitor for 2 hours in pm. Reported to oncoming RN.

## 2016-11-11 NOTE — Progress Notes (Signed)
[place] PHYSICAL MEDICINE & REHABILITATION     PROGRESS NOTE    Subjective/Complaints: Patient states she wants to go home. She doesn't recognize me from yesterday. However, she does know him a physician.  Oriented to hospital but not to month, day, year. We discussed that it was a special Sunday. She thought it was [place]. Her second guess was the" holiday with baskets and eggs".  ROS: Limited due cognitive/behavioral      Objective: Vital Signs: Blood pressure 107/61, pulse 88, temperature 97.9 F (36.6 C), temperature source Oral, resp. rate 18, weight 81.5 kg (179 lb 10.8 oz), SpO2 97 %. No results found. No results for input(s): WBC, HGB, HCT, PLT in the last 72 hours. No results for input(s): NA, K, CL, GLUCOSE, BUN, CREATININE, CALCIUM in the last 72 hours.  Invalid input(s): CO CBG (last 3)  No results for input(s): GLUCAP in the last 72 hours.  Wt Readings from Last 3 Encounters:  11/07/16 81.5 kg (179 lb 10.8 oz)  10/09/16 71.4 kg (157 lb 4.8 oz)  08/03/16 93 kg (205 lb)    Physical Exam:  Gen: NAD. Vital signs reviewed Eyes: No discharge. EOMI. Cardiovascular: RRR Respiratory: CTA B GI: Soft, non-distended. Abdominal incision with bed of pink granulation which is shrinking  Musc: No edema, no tenderness Neurological: She is alert.  Motor: moves all 4 ext Skin: dry  Psych: confused, oriented to hospital, person  Assessment/Plan: 1. Functional, cognitive and behavioral deficits secondary to severe TBI which require 3+ hours per day of interdisciplinary therapy in a comprehensive inpatient rehab setting. Physiatrist is providing close team supervision and 24 hour management of active medical problems listed below. Physiatrist and rehab team continue to assess barriers to discharge/monitor patient progress toward functional and medical goals.  Function:  Bathing Bathing position Bathing activity did not occur: Refused Position: Architect parts bathed by patient: Right arm, Left arm, Chest, Front perineal area, Buttocks, Right upper leg, Left upper leg, Right lower leg, Left lower leg, Back Body parts bathed by helper: Right arm, Left arm, Front perineal area, Buttocks, Right upper leg, Left upper leg, Right lower leg, Left lower leg, Back  Bathing assist Assist Level: Supervision or verbal cues (max  cues)      Upper Body Dressing/Undressing Upper body dressing   What is the patient wearing?: Pull over shirt/dress     Pull over shirt/dress - Perfomed by patient: Thread/unthread right sleeve, Thread/unthread left sleeve, Put head through opening, Pull shirt over trunk Pull over shirt/dress - Perfomed by helper: Thread/unthread right sleeve, Thread/unthread left sleeve, Put head through opening, Pull shirt over trunk        Upper body assist Assist Level: Supervision or verbal cues      Lower Body Dressing/Undressing Lower body dressing   What is the patient wearing?: Pants Underwear - Performed by patient: Thread/unthread right underwear leg, Thread/unthread left underwear leg, Pull underwear up/down   Pants- Performed by patient: Thread/unthread right pants leg, Thread/unthread left pants leg, Pull pants up/down Pants- Performed by helper: Pull pants up/down Non-skid slipper socks- Performed by patient: Don/doff right sock, Don/doff left sock Non-skid slipper socks- Performed by helper: Don/doff right sock, Don/doff left sock Socks - Performed by patient: Don/doff right sock, Don/doff left sock   Shoes - Performed by patient: Don/doff right shoe, Don/doff left shoe            Lower body assist Assist for lower body dressing: Supervision or verbal  cues      Toileting Toileting   Toileting steps completed by patient: Adjust clothing prior to toileting, Performs perineal hygiene, Adjust clothing after toileting Toileting steps completed by helper: Performs perineal hygiene Toileting Assistive Devices: Grab  bar or rail  Toileting assist Assist level: Supervision or verbal cues   Transfers Chair/bed transfer   Chair/bed transfer method: Ambulatory Chair/bed transfer assist level: No Help, no cues, assistive device, takes more than a reasonable amount of time Chair/bed transfer assistive device: Armrests     Locomotion Ambulation     Max distance: 150 ft Assist level: Supervision or verbal cues   Wheelchair   Type: Manual Max wheelchair distance: 110' Assist Level: Supervision or verbal cues  Cognition Comprehension Comprehension assist level: Understands basic 75 - 89% of the time/ requires cueing 10 - 24% of the time  Expression Expression assist level: Expresses basic 75 - 89% of the time/requires cueing 10 - 24% of the time. Needs helper to occlude trach/needs to repeat words.  Social Interaction Social Interaction assist level: Interacts appropriately 25 - 49% of time - Needs frequent redirection.  Problem Solving Problem solving assist level: Solves basic 25 - 49% of the time - needs direction more than half the time to initiate, plan or complete simple activities  Memory Memory assist level: Recognizes or recalls 25 - 49% of the time/requires cueing 50 - 75% of the time   Medical Problem List and Plan: 1. Decreased functional mobility  secondary to TBI/large right occipital epidural hematoma, mildly displaced right temporal bone fracture,Posterior cervical spine ligamentous injury with cervical collar, multiple rib fractures after walking in front of a bus             -continue CIR, PT, OT, speech             -RLAS V+   -ongoing dcognitive deficits ,Does not recall information given to her 1 minute earlier 2.  DVT Prophylaxis/Anticoagulation: SCDs. Dopplers negative 3. Pain Management: Hycet as needed 4. Mood/agitation related to traumatic brain injury:   -sleep/wake better in general  -continue risperdal at 2mg  qhs. increased risperdal to 1mg  bid during the day   -changed  wellbutrin to celexa 20mg  qhs  -Klonopin  1 mg  BID  - depakote  increased to 750mg  TID  -continue nicotine patch  -   librium 10mg  QHS back   -using environmental cues/mods as possible for behavior/cognition   -unzipping enclosure bed when possible, using Recliner chair with tray 5. Neuropsych: This patient is not capable of making decisions on her own behalf.             -poor judgment and awareness 6. Skin/Wound Care: wound clean and continues closing  -Surgery signed off for now. abx completed  -continue wet to dry dressings daily 7. Fluids/Electrolytes/Nutrition:   -potassium 4.1             8. Seizure prophylaxis. Keppra 500 mg twice a day --decreased to 250mg  9. Dysphagia with decreased nutritional storage. Clear liquids with honey thick liquids 10. 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   -decannulated   11. 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 Acute blood loss anemia. hgb 10.4  hgb 9.9 13. Polysubstance abuse. Provide counseling when appropriate   LOS (Days) 33 A FACE TO FACE EVALUATION WAS PERFORMED  Charlett Blake, MD 11/11/2016 11:32 AM

## 2016-11-12 ENCOUNTER — Inpatient Hospital Stay (HOSPITAL_COMMUNITY): Payer: Medicaid Other | Admitting: Physical Therapy

## 2016-11-12 ENCOUNTER — Inpatient Hospital Stay (HOSPITAL_COMMUNITY): Payer: Self-pay | Admitting: Occupational Therapy

## 2016-11-12 ENCOUNTER — Inpatient Hospital Stay (HOSPITAL_COMMUNITY): Payer: Medicaid Other | Admitting: Speech Pathology

## 2016-11-12 ENCOUNTER — Inpatient Hospital Stay (HOSPITAL_COMMUNITY): Payer: Self-pay

## 2016-11-12 NOTE — Progress Notes (Addendum)
Patient perseverating on going home throughout shift. Following last therapy during visit with mother, patient packed bags and made effort to leave unit. Verbally aggressive with RN and NT. Assisted back to enclosure bed by staff, left with call light/remote control.   Patient rested in enclosure bed for approx 2 hours with one bathroom break. Then patient sat up in chair to have dinner, toilet again. Insisted she was going to leave and demanded use of phone. I[ at nurses' station at this time in [place].

## 2016-11-12 NOTE — Progress Notes (Signed)
Physical Therapy Session Note  Patient Details  Name: Jacqueline [name] MRN: [id number] Date of Birth: 1966/05/14  Today's Date: 11/12/2016 PT Individual Time: [id number] and [id number] PT Individual Time Calculation (min): 57 min and 53 min  Skilled Therapeutic Interventions/Progress Updates:    Treatment 1: Pt received in geri-chair at nurses station. Session focused on functional mobility and cognitive remediation. Pt ambulated throughout unit without AD and supervision; pt with loss of balance x 2 but able to correct without physical assist from therapist. Pt negotiated 24 steps (6" + 3") with B rails and supervision and retrieved object from floor with supervision. At beginning of session pt perseverating on going home to see her "dying grandmother" and "leaving AMA". Pt required max cuing for redirection. Pt engaged in puzzle assembly with max cuing to initiate task and for problem solving that lessened to mod cuing as activity progressed. At end of session pt performed toileting transfer (continent void) with distant supervision; pt did require cuing to not use tape as toilet paper. Pt left sitting in geri-chair in care of RN while eating breakfast.   Treatment 2: Pt received in geri-chair at nurses station. Pt without behaviors or c/o pain during session. Session focused on cognitive remediation with pt assembling puzzle. Allowed pt extra time for error correction but pt continues to require mod assist overall for problem solving & error correction. Pt perseverative on going home "AMA" and speaking with LCSW on this date. Pt required max cuing for re-direction, at times requiring multiple people to assist with redirection. Pt requires max cuing for orientation. At end of session pt left sitting in geri-chair with lap tray donned at nurses station.   Therapy Documentation Precautions:  Precautions Precautions: Cervical, Fall, Other (comment) Precaution Comments: abdominal incision Required  Braces or Orthoses: Cervical Brace Cervical Brace: Hard collar, At all times Restrictions Weight Bearing Restrictions: No   See Function Navigator for Current Functional Status.   Therapy/Group: Individual Therapy  [name] 11/12/2016, 2:22 PM

## 2016-11-12 NOTE — Progress Notes (Signed)
Occupational Therapy Note  Patient Details  Name: Jacqueline [name] MRN: [id number] Date of Birth: [birth year]  Today's Date: 11/12/2016 OT Individual Time: 1400-1430 OT Individual Time Calculation (min): 30 min   Pt denied pain Individual Therapy  Pt resting in gerichair at nursing station upon arrival.  Pt perseverating on going home to "take care of" her children because they were sick and "puking." Pt agreed to walk into Dayroom but declined to engaged in activities.  RN approached with medications.  Pt required max verbal cues to take scheduled medications.  RN explained that one of the medications was for seizure prevention.  Pt perseverated on going home to "take care" of her children because they were having seizures.  Pt's Mom arrived and pt was persuaded to return to her room and visit.  Pt adamant that she needed to go home and she would quit if she needed to.  Pt required tot A for orientation and became argumentative when corrected. Pt remained in [place] with Mom present.     [name] [facility] 11/12/2016, 2:43 PM

## 2016-11-12 NOTE — Progress Notes (Signed)
Physical Therapy Weekly Progress Note  Patient Details  Name: SALAMA STUVER MRN: [id number] Date of Birth: [birth year]  Beginning of progress report period: November 06, 2016 End of progress report period: November 12, 2016  Today's Date: 11/12/2016  Patient is making slow progress towards cognitive goals but continues to meet physical goals at supervision level. Pt continues to require max cuing for orientation and demonstrates impaired safety awareness, problem solving, and attention. Pt continues to require supervision during functional mobility 2/2 impaired awareness, attention & balance.  Patient continues to demonstrate the following deficits muscle weakness, decreased cardiorespiratoy endurance, decreased coordination, decreased attention, decreased awareness, decreased problem solving, decreased safety awareness and decreased memory, and decreased standing balance and decreased balance strategies and therefore will continue to benefit from skilled PT intervention to increase functional independence with mobility.  Patient progressing toward long term goals..  Continue plan of care.  PT Short Term Goals Week 6:  PT Short Term Goal 1 (Week 6): Pt will be oriented to location 75% of the time. PT Short Term Goal 2 (Week 6): Pt will consistently demonstrate intellectual awawreness with mod cuing.  Therapy Documentation Precautions:  Precautions Precautions: Cervical, Fall, Other (comment) Precaution Comments: abdominal incision Required Braces or Orthoses: Cervical Brace Cervical Brace: Hard collar, At all times Restrictions Weight Bearing Restrictions: No   See Function Navigator for Current Functional Status.   [name] 11/12/2016, 5:48 PM

## 2016-11-12 NOTE — Progress Notes (Signed)
Occupational Therapy Session Note  Patient Details  Name: AEJA GARNEY MRN: [id number] Date of Birth: [birth year]  Today's Date: 11/12/2016 OT Individual Time: 0700-0758 OT Individual Time Calculation (min): 58 min    Short Term Goals: Week 5:  OT Short Term Goal 1 (Week 5): Pt will demonstrate improved orientation to identify that she is in a hospital with mod verbal cues OT Short Term Goal 2 (Week 5): Pt will attend to bathing tasks for 5 minutes with min verbal cues  OT Short Term Goal 3 (Week 5): Pt will initiate use of Memory Book to assist with recall of daily events with max verbal cues  Skilled Therapeutic Interventions/Progress Updates:    Pt in bathroom upon arrival with RN present.  Pt required tot A for orientation throughout session and would become argumentative when challenged or corrected.  Pt amb to gym and engaged in exercise on NuStep for approx 15 mins.  Pt returned to room but declined engaging in activities this morning.  Pt declined bathing/shower stating she bathed earlier.  Pt required max verbal cues throughout session to redirect to simple activities/tasks.  Pt continues to perseverate on going home, although she currently states she is in [place] in [place]. Pt returned to gerichair at nursing station.   Therapy Documentation Precautions:  Precautions Precautions: Cervical, Fall, Other (comment) Precaution Comments: abdominal incision Required Braces or Orthoses: Cervical Brace Cervical Brace: Hard collar, At all times Restrictions Weight Bearing Restrictions: No   Pain:  Pt denied pain  See Function Navigator for Current Functional Status.   Therapy/Group: Individual Therapy  Leroy Libman 11/12/2016, 7:58 AM

## 2016-11-12 NOTE — Progress Notes (Signed)
Occupational Therapy Session Note  Patient Details  Name: Jacqueline [name] MRN: [id number] Date of Birth: 06-28-1966  Today's Date: 11/12/2016 OT Individual Time: [id number] OT Individual Time Calculation (min): 31 min    Skilled Therapeutic Interventions/Progress Updates: Pt participated in skilled OT session focusing on sustained attention and problem solving. Pt was received at RN station. She was agreeable to complete crossword puzzle, mod cues for maintaining attention to task and redirection when pt perseverated on leaving facility. Simple math attempted with pt trying to assess her age, 65 A required for correct answer. At end of session pt was left at RN station.      Therapy Documentation Precautions:  Precautions Precautions: Cervical, Fall, Other (comment) Precaution Comments: abdominal incision Required Braces or Orthoses: Cervical Brace Cervical Brace: Hard collar, At all times Restrictions Weight Bearing Restrictions: No General:   Vital Signs: Therapy Vitals Temp: 98.2 F (36.8 C) Temp Source: Oral Pulse Rate: 81 Resp: 18 BP: 97/64 Patient Position (if appropriate): Sitting Oxygen Therapy SpO2: 100 % O2 Device: Not Delivered Pain: No c/o pain during session    ADL: ADL ADL Comments: refer to functional navigator    See Function Navigator for Current Functional Status.   Therapy/Group: Individual Therapy  [name] A Jaquari Reckner 11/12/2016, 12:51 PM
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
## 2016-11-12 NOTE — Progress Notes (Signed)
   11/12/16 0836  What Happened  Was fall witnessed? Yes  Who witnessed fall? Denman George, PTA  Patients activity before fall other (comment);ambulating-unassisted (part of behavior plan)  Point of contact buttocks  Was patient injured? No  Follow Up  MD notified Naaman Plummer  Time MD notified [phone]  Adult Fall Risk Assessment  Risk Factor Category (scoring not indicated) Fall has occurred during this admission (document High fall risk)  Patient's Fall Risk High Fall Risk (>13 points)  Adult Fall Risk Interventions  Required Bundle Interventions *See Row Information* High fall risk - low, moderate, and high requirements implemented  Additional Interventions Room near nurses station  Vitals  Temp 98 F (36.7 C)  Temp Source Oral  BP 109/61  BP Location Left Arm  BP Method Automatic  Patient Position (if appropriate) Sitting  Pulse Rate 88  Pulse Rate Source Dinamap  Resp 20  Oxygen Therapy  SpO2 100 %  O2 Device Room Air  Pain Assessment  Pain Assessment No/denies pain  Neurological  Neuro (WDL) X (no changes post fall)  Level of Consciousness Alert  Orientation Level Oriented to person  Cognition Impulsive;Poor attention/concentration;Poor judgement;Poor safety awareness  Speech Clear  R Hand Grip Moderate  L Hand Grip Moderate   RUE Motor Response Purposeful movement  RUE Motor Strength 5  LUE Motor Response Purposeful movement  LUE Motor Strength 5  RLE Motor Response Purposeful movement  RLE Motor Strength 4  LLE Motor Response Purposeful movement  LLE Motor Strength 4  Neuro Symptoms Anxiety  Musculoskeletal  Musculoskeletal (WDL) X  Assistive Device None  Generalized Weakness Yes  Weight Bearing Restrictions No  Integumentary  Integumentary (WDL) X  Skin Color Appropriate for ethnicity  Skin Condition Dry  Skin Integrity Surgical Incision (see LDA) (Dressing C/D/I)  Per behavioral plan, patient in room ambulating. PTA witnessed patient fall onto  buttocks, no head involvement.

## 2016-11-12 NOTE — Progress Notes (Signed)
Speech Language Pathology Daily Session Note  Patient Details  Name: Jacqueline [name] MRN: [id number] Date of Birth: [birth year]  Today's Date: 11/12/2016 SLP Individual Time: 1130-1200 SLP Individual Time Calculation (min): 30 min  Short Term Goals: Week 5: SLP Short Term Goal 1 (Week 5): Patient will demonstrate sustained attention to a task for 10 minutes with Max A multimodal cues.  SLP Short Term Goal 2 (Week 5): Patient will orient to place and situation with Mod A multimodal cues.  SLP Short Term Goal 3 (Week 5): Patient will demonstrate basic problem solving for self-care tasks with Mod A multimodal cues.  SLP Short Term Goal 4 (Week 5): Patient will consume trials of regular textures and demonstrate efficient mastication with complete oral clearance and without overt s/s of aspiration with Mod A verbal cues over 2 sessions prior to upgrade.  SLP Short Term Goal 5 (Week 5): Patient will identify 1 cognitive and 1 physical deficit with Mod A multimodal cues.   Skilled Therapeutic Interventions: Skilled treatment session focused on cognitive goals. SLP facilitated session by providing Max A verbal cues for problem solving in regards to tray set-up and for sustained attention to self-feeding for ~30-60 second increments. Patient perseverative on going home throughout task and required frequent redirection. Patient also required total A for orientation to place, time and situation. Patient with intermittent decreased awareness of bolus throughout session due to verbosity and required verbal cues to initiate a swallow. Patient left upright in [name] with all needs within reach. Continue with current plan of care.      Function:  Cognition Comprehension Comprehension assist level: Understands basic 75 - 89% of the time/ requires cueing 10 - 24% of the time  Expression   Expression assist level: Expresses basic 75 - 89% of the time/requires cueing 10 - 24% of the time. Needs helper to  occlude trach/needs to repeat words.  Social Interaction Social Interaction assist level: Interacts appropriately 25 - 49% of time - Needs frequent redirection.  Problem Solving Problem solving assist level: Solves basic 25 - 49% of the time - needs direction more than half the time to initiate, plan or complete simple activities  Memory Memory assist level: Recognizes or recalls 25 - 49% of the time/requires cueing 50 - 75% of the time    Pain No/Denies Pain   Therapy/Group: Individual Therapy  Sasha Rogel 11/12/2016, 3:33 PM

## 2016-11-12 NOTE — Progress Notes (Signed)
Pt slept in bed till around 545 am, and toileted at that time. Patient wanted to stay up an get coffee. Pt agreed to sit in Geri-chair and let staff fix the coffee for her. After giving patient her morning meds, pt stayed in room in the chair till about 0615 where staff reported she was climbing out of he chair. The staff were able to redirect the patient back into the chair and brought to the nursing station. Patient remained calm with very minimal agitation. Cont plan of care. Emmanual Gauthreaux, Dione Plover

## 2016-11-13 ENCOUNTER — Inpatient Hospital Stay (HOSPITAL_COMMUNITY): Payer: Self-pay | Admitting: Physical Therapy

## 2016-11-13 ENCOUNTER — Inpatient Hospital Stay (HOSPITAL_COMMUNITY): Payer: Medicaid Other | Admitting: Speech Pathology

## 2016-11-13 ENCOUNTER — Inpatient Hospital Stay (HOSPITAL_COMMUNITY): Payer: Self-pay

## 2016-11-13 ENCOUNTER — Inpatient Hospital Stay (HOSPITAL_COMMUNITY): Payer: Medicaid Other | Admitting: Physical Therapy

## 2016-11-13 MED ORDER — CLONAZEPAM 0.5 MG PO TABS
0.5000 mg | ORAL_TABLET | Freq: Two times a day (BID) | ORAL | Status: DC
  Administered 2016-11-13 – 2016-11-16 (×6): 0.5 mg via ORAL
  Filled 2016-11-13 (×6): qty 1

## 2016-11-13 NOTE — Progress Notes (Signed)
Speech Language Pathology Daily Session Note  Patient Details  Name: Jacqueline [name] MRN: [id number] Date of Birth: 04/11/1966  Today's Date: 11/13/2016 SLP Individual Time: 1000-1040 SLP Individual Time Calculation (min): 40 min  Short Term Goals: Week 5: SLP Short Term Goal 1 (Week 5): Patient will demonstrate sustained attention to a task for 10 minutes with Max A multimodal cues.  SLP Short Term Goal 2 (Week 5): Patient will orient to place and situation with Mod A multimodal cues.  SLP Short Term Goal 3 (Week 5): Patient will demonstrate basic problem solving for self-care tasks with Mod A multimodal cues.  SLP Short Term Goal 4 (Week 5): Patient will consume trials of regular textures and demonstrate efficient mastication with complete oral clearance and without overt s/s of aspiration with Mod A verbal cues over 2 sessions prior to upgrade.  SLP Short Term Goal 5 (Week 5): Patient will identify 1 cognitive and 1 physical deficit with Mod A multimodal cues.   Skilled Therapeutic Interventions: Skilled treatment session focused on cognitive goals. SLP facilitated session by providing Max A verbal cues for orientation to place and time. Patient continues to demonstrate language of confusion and requires Max A verbal cues for redirection. Patient participated in a basic picture sequencing task and organized 6 steps into sequential order with Min A verbal cues to self-monitor and correct errors. However, patient demonstrated sustained attention to task for 10 minutes with Mod I. Patient asking to go home intermittently throughout session but was easily redirected. Patient left upright in [place] at [employer] Continue with current plan of care.      Function:   Cognition Comprehension Comprehension assist level: Understands basic 75 - 89% of the time/ requires cueing 10 - 24% of the time  Expression   Expression assist level: Expresses basic 75 - 89% of the time/requires cueing 10 -  24% of the time. Needs helper to occlude trach/needs to repeat words.  Social Interaction Social Interaction assist level: Interacts appropriately 25 - 49% of time - Needs frequent redirection.  Problem Solving Problem solving assist level: Solves basic 25 - 49% of the time - needs direction more than half the time to initiate, plan or complete simple activities  Memory Memory assist level: Recognizes or recalls 25 - 49% of the time/requires cueing 50 - 75% of the time    Pain No/Denies Pain   Therapy/Group: Individual Therapy  Jahkari Maclin 11/13/2016, 2:17 PM

## 2016-11-13 NOTE — Progress Notes (Addendum)
Physical Therapy Note  Patient Details  Name: BYRL BURKET MRN: [id number] Date of Birth: Mar 08, 1966 Today's Date: 11/13/2016    Time: 830-930 60 minutes  1:1 No c/o pain. Session focused on attention, orientation, awareness and problem solving with functional and therapeutic tasks. Nu step x 10 minutes with supervision cuing.  Pt able to tell therapist number of steps and time remaining with min cuing.  Diona Foley toss with naming animals and foods with mod/max questioning and phonemic cuing.  Puzzle task with mod/max A to complete task and sustain attention. Pt oriented to place and situation 75% of session.   Time 2: 1500-1551 51 minutes  1:1 No c/o pain.  Pt performed dynavision with pt able to only push red and not green lights with 100% accuracy.  Pt's reaction time 2-3 seconds.  Pt made words with letter magnets, able to pick out appropriate letters and make related words without cuing. Pt able to attend to task x 20 minutes.  Pt continuing to perseverate on going home but more easily redirected than morning session.  Syncere Kaminski 11/13/2016, 9:30 AM
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
## 2016-11-13 NOTE — Progress Notes (Signed)
Occupational Therapy Session Note  Patient Details  Name: Jacqueline [name] MRN: [id number] Date of Birth: [birth year]  Today's Date: 11/13/2016 OT Individual Time: 0700-0800 OT Individual Time Calculation (min): 60 min    Short Term Goals: Week 2:  OT Short Term Goal 1 (Week 2): Pt will demonstrate improved praxis by using deoderant in the correct manner with min verbal cues OT Short Term Goal 1 - Progress (Week 2): Met OT Short Term Goal 2 (Week 2): Pt will attend to self care task for at least 3 minutes without redirection.  OT Short Term Goal 2 - Progress (Week 2): Met OT Short Term Goal 3 (Week 2): Pt will demonstrate improved orientation to identify that she is in a hospital with mod verbal cues OT Short Term Goal 3 - Progress (Week 2): Progressing toward goal    Skilled Therapeutic Interventions/Progress Updates:    Pt sitting in enclosure bed upon arrival requesting to use the bathroom.  Pt amb to bathroom and returned to gerichair to eat breakfast.  Pt ate approx 15% of her meal, stating she was full.  Pt required tot A for orientation to date, place, and situation this morning and became argumentative when corrected.  Pt became frustrated when she was informed that I did not know any of her friends that she was referring to.  Pt perseverated on going home and poured her coffee into her water bottle along with creamer and syrup.  Pt agreed to go to gym and engaged in therex on [place].  Pt continued to perseverate on going home and could not be redirected for more than approx 30 seconds.  Pt directed back to room and returned to gerichair with tray in place.  Pt requested to sit out at nursing station.  Pt remained at nursing station.   Therapy Documentation Precautions:  Precautions Precautions: Cervical, Fall, Other (comment) Precaution Comments: abdominal incision Required Braces or Orthoses: Cervical Brace Cervical Brace: Hard collar, At all times Restrictions Weight Bearing  Restrictions: No   Pain:  Pt denied pain  See Function Navigator for Current Functional Status.   Therapy/Group: Individual Therapy  Leroy Libman 11/13/2016, 8:01 AM

## 2016-11-13 NOTE — Progress Notes (Addendum)
Physical Therapy Session Note  Patient Details  Name: Jacqueline [name] MRN: [id number] Date of Birth: 1966-08-14  Today's Date: 11/13/2016 PT Individual Time: [id number] PT Individual Time Calculation (min): 25 min   Short Term Goals: Week 6:  PT Short Term Goal 1 (Week 6): Pt will be oriented to location 75% of the time. PT Short Term Goal 2 (Week 6): Pt will consistently demonstrate intellectual awawreness with mod cuing.  Skilled Therapeutic Interventions/Progress Updates:    Pt received in geri-chair at nurses station. Session focused on cognitive remediation & functional mobility. Pt ambulated throughout unit without AD & supervision, negotiated obstacles, ramp and step with supervision for balance and mod/max cuing for safety awareness. Pt assembled simple puzzle with min cuing for problem solving. Pt perseverated on speaking with LSCW with max cuing to re-direct. Pt required max cuing for orientation. At end of session pt left sitting in geri-chair at nurses station with lap tray donned.  Therapy Documentation Precautions:  Precautions Precautions: Cervical, Fall, Other (comment) Precaution Comments: abdominal incision Required Braces or Orthoses: Cervical Brace Cervical Brace: Hard collar, At all times Restrictions Weight Bearing Restrictions: No   See Function Navigator for Current Functional Status.   Therapy/Group: [place] 11/13/2016, 12:12 PM
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
## 2016-11-13 NOTE — Progress Notes (Signed)
Nurse assessed patient to find wander guard ripped from ankle and in posey bed with patient.

## 2016-11-14 ENCOUNTER — Inpatient Hospital Stay (HOSPITAL_COMMUNITY): Payer: Medicaid Other | Admitting: Physical Therapy

## 2016-11-14 ENCOUNTER — Inpatient Hospital Stay (HOSPITAL_COMMUNITY): Payer: Self-pay

## 2016-11-14 ENCOUNTER — Inpatient Hospital Stay (HOSPITAL_COMMUNITY): Payer: Medicaid Other | Admitting: Psychology

## 2016-11-14 DIAGNOSIS — R413 Other amnesia: Secondary | ICD-10-CM

## 2016-11-14 LAB — BASIC METABOLIC PANEL
ANION GAP: 8 (ref 5–15)
BUN: <5 mg/dL — ABNORMAL LOW (ref 6–20)
CALCIUM: 8.6 mg/dL — AB (ref 8.9–10.3)
CHLORIDE: 103 mmol/L (ref 101–111)
CO2: 25 mmol/L (ref 22–32)
Creatinine, Ser: 0.73 mg/dL (ref 0.44–1.00)
GFR calc non Af Amer: >60 mL/min (ref >60)
Glucose, Bld: 90 mg/dL (ref 65–99)
Potassium: 4 mmol/L (ref 3.5–5.1)
Sodium: 136 mmol/L (ref 135–145)

## 2016-11-14 LAB — VALPROIC ACID LEVEL: Valproic Acid Lvl: 101 ug/mL — ABNORMAL HIGH (ref 50.0–100.0)

## 2016-11-14 LAB — CBC
HEMATOCRIT: 36.7 % (ref 36.0–46.0)
HEMOGLOBIN: 11.5 g/dL — AB (ref 12.0–15.0)
MCH: 29.3 pg (ref 26.0–34.0)
MCHC: 31.3 g/dL (ref 30.0–36.0)
MCV: 93.6 fL (ref 78.0–100.0)
PLATELETS: 258 10*3/uL (ref 150–400)
RBC: 3.92 MIL/uL (ref 3.87–5.11)
RDW: 19 % — ABNORMAL HIGH (ref 11.5–15.5)
WBC: 5.7 10*3/uL (ref 4.0–10.5)

## 2016-11-14 MED ORDER — CITALOPRAM HYDROBROMIDE 20 MG PO TABS
40.0000 mg | ORAL_TABLET | Freq: Every day | ORAL | Status: DC
  Administered 2016-11-14 – 2016-12-11 (×28): 40 mg via ORAL
  Filled 2016-11-14 (×28): qty 2

## 2016-11-14 MED ORDER — VALPROIC ACID 250 MG PO CAPS
500.0000 mg | ORAL_CAPSULE | Freq: Three times a day (TID) | ORAL | Status: DC
  Administered 2016-11-14: 500 mg via ORAL
  Filled 2016-11-14: qty 2

## 2016-11-14 MED ORDER — RISPERIDONE 1 MG PO TABS
0.5000 mg | ORAL_TABLET | Freq: Two times a day (BID) | ORAL | Status: DC
  Administered 2016-11-14 – 2016-11-30 (×32): 0.5 mg via ORAL
  Filled 2016-11-14 (×33): qty 1

## 2016-11-14 MED ORDER — VALPROIC ACID 250 MG PO CAPS
500.0000 mg | ORAL_CAPSULE | Freq: Three times a day (TID) | ORAL | Status: DC
  Administered 2016-11-15 – 2016-12-12 (×81): 500 mg via ORAL
  Filled 2016-11-14 (×82): qty 2
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
## 2016-11-14 NOTE — Progress Notes (Signed)
Speech Language Pathology Weekly Progress and Session Note  Patient Details  Name: Jacqueline [name] MRN: [id number] Date of Birth: [birth year]  Beginning of progress report period: November 07, 2016 End of progress report period: November 14, 2016  Today's Date: 11/14/2016 SLP Individual Time: 1135-1205 SLP Individual Time Calculation (min): 30 min  Short Term Goals: Week 5: SLP Short Term Goal 1 (Week 5): Patient will demonstrate sustained attention to a task for 10 minutes with Max A multimodal cues.  SLP Short Term Goal 1 - Progress (Week 5): Not met SLP Short Term Goal 2 (Week 5): Patient will orient to place and situation with Mod A multimodal cues.  SLP Short Term Goal 2 - Progress (Week 5): Not met SLP Short Term Goal 3 (Week 5): Patient will demonstrate basic problem solving for self-care tasks with Mod A multimodal cues.  SLP Short Term Goal 3 - Progress (Week 5): Not met SLP Short Term Goal 4 (Week 5): Patient will consume trials of regular textures and demonstrate efficient mastication with complete oral clearance and without overt s/s of aspiration with Mod A verbal cues over 2 sessions prior to upgrade.  SLP Short Term Goal 4 - Progress (Week 5): Other (comment) SLP Short Term Goal 5 (Week 5): Patient will identify 1 cognitive and 1 physical deficit with Mod A multimodal cues.  SLP Short Term Goal 5 - Progress (Week 5): Not met    New Short Term Goals: Week 6: SLP Short Term Goal 1 (Week 6): Patient will demonstrate sustained attention to a task for 10 minutes with Max A multimodal cues.  SLP Short Term Goal 2 (Week 6): Patient will orient to place and situation with Mod A multimodal cues.  SLP Short Term Goal 3 (Week 6): Patient will demonstrate basic problem solving for self-care tasks with Mod A multimodal cues.  SLP Short Term Goal 4 (Week 6): Patient will identify 1 cognitive and 1 physical deficit with Mod A multimodal cues.  SLP Short Term Goal 5 (Week 6): Patient will  consume trials of regular textures and demonstrate efficient mastication with complete oral clearance and without overt s/s of aspiration with Mod A verbal cues over 2 sessions prior to upgrade.    Weekly Progress Updates: Patient has made minimal  functional gains and has met 0 of 5STG's this reporting period. Currently, patient is demonstrating behaviors consistent with a [place] requires overall Max-Total A to complete functional and familiar tasks safely in regards functional problem solving, sustainedattention, recall of new information, and intellectual awareness. Patient continues to demonstrate verbosity with language of confusion, confabulation, and perseveration. A memory notebook has been beneficial with maximizing orientation but patient needs continued reinforcement for utilziation. Patient is currently consuming Dys. 3 textures with thin liquids via cup and straw with minimal overt s/s of aspiration and requires Min-ModA verbal cues for use of swallowing compensatory strategies due to decreased attention to bolus at times.  Patient and family education is ongoing. Patient would benefit from continued skilled SLP intervention to maximize her cognitive and swallowing function and overall functional independence prior to discharge.     Intensity: Minumum of 1-2 x/day, 30 to 90 minutes Frequency: 3 to 5 out of 7 days Duration/Length of Stay: TBD due to SNF placement  Treatment/Interventions: Cognitive remediation/compensation;Cueing hierarchy;Functional tasks;Patient/family education;Therapeutic Activities;Internal/external aids;Dysphagia/aspiration precaution training;Environmental controls   Daily Session  Skilled Therapeutic Interventions: Skilled treatment session focused on cognitive goals. SLP facilitated session by providing Max A multimodal cues for orientation  to time, place and situation with focus on utilization of memory notebook and recall of information after a  60 second delay. Patient was confabulating throughout session but was easily redirected. Patient left upright at RN station in [employer]. Continue with current plan of care.       Function:   Cognition Comprehension Comprehension assist level: Understands basic 75 - 89% of the time/ requires cueing 10 - 24% of the time  Expression   Expression assist level: Expresses basic 75 - 89% of the time/requires cueing 10 - 24% of the time. Needs helper to occlude trach/needs to repeat words.  Social Interaction Social Interaction assist level: Interacts appropriately 25 - 49% of time - Needs frequent redirection.  Problem Solving Problem solving assist level: Solves basic 25 - 49% of the time - needs direction more than half the time to initiate, plan or complete simple activities  Memory Memory assist level: Recognizes or recalls 25 - 49% of the time/requires cueing 50 - 75% of the time   Pain No/Denies Pain   Therapy/Group: Individual Therapy  Ennis Heavner 11/14/2016, 3:21 PM

## 2016-11-14 NOTE — Progress Notes (Signed)
Physical Therapy Session Note  Patient Details  Name: Jacqueline [name] MRN: [id number] Date of Birth: 1966-07-09  Today's Date: 11/14/2016 PT Individual Time: 0903-1000 PT Individual Time Calculation (min): 57 min   Short Term Goals: Week 6:  PT Short Term Goal 1 (Week 6): Pt will be oriented to location 75% of the time. PT Short Term Goal 2 (Week 6): Pt will consistently demonstrate intellectual awawreness with mod cuing.  Skilled Therapeutic Interventions/Progress Updates:  Pt received in geri-chair in room. Session focused on cognitive remediation. Pt engaged in conversation and use of memory notebook with max cuing and max assist for orientation to date, location, situation and time. Pt requires max assist for emergent & intellectual awareness and has little to no carryover of orientation throughout session despite significant time spent on orientation. Pt requested hot tea; therapist retrieved cup of hot water but pt able to fix tea (tea bags, sugar & creamer) without assistance. Pt engaged in pipe tree assembly with small pipes and most simple shape. Pt required max cuing for problem solving, error correction and to follow instructions. Pt with poor attention to therapist's instructions during task. At end of session pt left sitting in geri-chair with BLE elevated & lap tray donned, sitting at nurses station.  Pt requested to go home x 2 times during session but was more easily redirected with max cuing.     No behaviors or c/o pain during session.  Therapy Documentation Precautions:  Precautions Precautions: Cervical, Fall, Other (comment) Precaution Comments: abdominal incision Required Braces or Orthoses: Cervical Brace Cervical Brace: Hard collar, At all times Restrictions Weight Bearing Restrictions: No   See Function Navigator for Current Functional Status.   Therapy/Group: Individual Therapy  [name] 11/14/2016, 11:35 AM

## 2016-11-14 NOTE — Progress Notes (Signed)
Occupational Therapy Session Note  Patient Details  Name: Jacqueline [name] MRN: [id number] Date of Birth: 30-Apr-1966  Today's Date: 11/14/2016 OT Individual Time: 0700-0800 OT Individual Time Calculation (min): 60 min    Short Term Goals: Week 5:  OT Short Term Goal 1 (Week 5): Pt will demonstrate improved orientation to identify that she is in a hospital with mod verbal cues OT Short Term Goal 2 (Week 5): Pt will attend to bathing tasks for 5 minutes with min verbal cues  OT Short Term Goal 3 (Week 5): Pt will initiate use of Memory Book to assist with recall of daily events with max verbal cues  Skilled Therapeutic Interventions/Progress Updates:    Focus on orientation and eating breakfast.  Pt required tot A throughout session for orientation to place and date.  Pt consistently addressed therapist as a long time acquaintance.  Pt becomes argumentative and agitated when corrected.  Pt continues to mix food substances and use condiments inappropriately (i.e. Pudding in coffee, syrup in coffee, etc.) Pt returned to gerichair sitting at nursing station.   Therapy Documentation Precautions:  Precautions Precautions: Cervical, Fall, Other (comment) Precaution Comments: abdominal incision Required Braces or Orthoses: Cervical Brace Cervical Brace: Hard collar, At all times Restrictions Weight Bearing Restrictions: No   Pain: Pain Assessment Pain Assessment: No/denies pain Pain Score: 0-No pain  See Function Navigator for Current Functional Status.   Therapy/Group: Individual Therapy  Leroy Libman 11/14/2016, 12:06 PM

## 2016-11-14 NOTE — Progress Notes (Signed)
Neuropsychological Consultation  Patient:   Jacqueline [name]   DOB:   02/04/1966  MR Number:  [id number]  Location:  [facility] [address] [id number] [place] [address] Dept: [id number]: [id number]           Date of Service:   11/14/2016  Start Time:   11:30 End Time:   12:10  Provider/Observer:  Haynes Dage PSYD [place]       Billing Code/Service: [phone]  Chief Complaint:     Chief Complaint  Patient presents with  . Memory Loss    Reason for Service:  The patient was referred for neuropsychological assessment/observation while she is receiving speech therapy or other interventions.  Observations of memory deficits in practical situations.  Current Status:  The patient continues to have profound memory deficits both anterograde and retrograde.  Watching the patient engaged in therapy, there was clear deficits in new learning and the patient had to be cued over and over.  She does appear to be learning some coping/adaptive strategies and they are benefiting her adjustment.  She is confabulating as an apparent attempt make sense of the confusion and distortions her memory deficits create.  This is likely not a conscious effort but more of an automatic coping response.  Reliability of Information: Information from direct observation of patient, review symptoms from staff members, review of medical records and communicating directly with patient.    Behavioral Observation: MALAYAH DUSCH  presents as a 51 y.o.-year-old Left Caucasian Female who appeared her stated age. her dress was Appropriate and she was Fairly Groomed and her manners were Appropriate to the situation.  her participation was indicative of significant memory loss and impaired ability to learn new information.  This results in primary confabulatory  behaviors.  There were physical disabilities noted.   she displayed an appropriate level of cooperation and motivation.     Interactions:    Active Redirectable  Attention:   The patient is clearly distracted by internal preoccupations and memory connections that are invalid but believed by patient.   and attention span appeared shorter than expected for age  Memory:   abnormal; global memory impairment noted  Visuo-spatial:  within normal limits  Speech (Volume):  normal  Speech:   Slowed but generally fluent with some articulation errors.  Thought Process:  Relevant, Circumstantial, Tangential and Disorganized  Though Content:  Rumination; confabulations  Orientation:   The patient was only oriented to person.  She was not oriented to place, time or situation.  Judgment:   Poor  Planning:   Poor  Affect:    Blunted  Mood:    Euphoric  Insight:   Lacking  Intelligence:   normal  Substance Use:  There are suspicions of alcohol and marijuana abuse reported by the patient.    Medical History:   Past Medical History:  Diagnosis Date  . Alcohol abuse   . Alcoholism ([place])   . Anemia   . Anxiety   . Back pain   . Benzodiazepine abuse   . Depression   . Hypoglycemia   . Hypothyroidism   . Narcotic abuse   . Peptic ulcer   . Seizures ([place])   . Suicide attempt    multiple times  . Thrombocytopenia ([place]) 06/17/2011  . Thyroid disease         Facility-Administered Encounter Medications as of 11/14/2016  Medication  . albuterol (  PROVENTIL) (2.5 MG/3ML) 0.083% nebulizer solution 2.5 mg  . bisacodyl (DULCOLAX) suppository 10 mg  . chlordiazePOXIDE (LIBRIUM) capsule 10 mg  . citalopram (CELEXA) tablet 40 mg  . clonazePAM (KLONOPIN) tablet 0.5 mg  . feeding supplement (ENSURE ENLIVE) (ENSURE ENLIVE) liquid 237 mL  . HYDROcodone-acetaminophen (NORCO) 7.5-325 MG per tablet 1 tablet  . levETIRAcetam (KEPPRA) tablet 250 mg  . LORazepam (ATIVAN) tablet 0.5 mg   Or  . LORazepam (ATIVAN) injection 0.5 mg  . MEDLINE mouth rinse   . neomycin-bacitracin-polymyxin (NEOSPORIN) ointment  . nicotine (NICODERM CQ - dosed in mg/24 hours) patch 21 mg  . ondansetron (ZOFRAN) tablet 4 mg   Or  . ondansetron (ZOFRAN) injection 4 mg  . pantoprazole (PROTONIX) EC tablet 40 mg  . polycarbophil (FIBERCON) tablet 625 mg  . potassium chloride SA (K-DUR,KLOR-CON) CR tablet 20 mEq  . risperiDONE (RISPERDAL M-TABS) disintegrating tablet 2 mg  . risperiDONE (RISPERDAL) tablet 0.5 mg  . simethicone (MYLICON) 40 [id number] suspension 40 mg  . sorbitol 70 % solution 30 mL  . thiamine (VITAMIN B-1) tablet 100 mg  . valproic acid (DEPAKENE) 250 MG capsule 500 mg  . [DISCONTINUED] valproic acid (DEPAKENE) 250 MG capsule 750 mg   Outpatient Encounter Prescriptions as of 11/14/2016  Medication Sig  . amitriptyline (ELAVIL) 50 MG tablet Take 50-100 mg by mouth at bedtime as needed for sleep.   Marland Kitchen buPROPion (WELLBUTRIN XL) 300 MG 24 hr tablet Take 300 mg by mouth daily.  . clonazePAM (KLONOPIN) 1 MG tablet Take 1 mg by mouth 3 (three) times daily.  Marland Kitchen gabapentin (NEURONTIN) 400 MG capsule Take 2 capsules (800 mg total) by mouth 3 (three) times daily. For agitation (Patient taking differently: Take 800 mg by mouth 3 (three) times daily as needed (pain). For agitation)  . levothyroxine (SYNTHROID, LEVOTHROID) 175 MCG tablet Take 1 tablet (175 mcg total) by mouth daily before breakfast. For low thyroid function  . norethindrone (MICRONOR,CAMILA,ERRIN) 0.35 MG tablet Take 1 tablet by mouth daily.  Marland Kitchen omeprazole (PRILOSEC) 20 MG capsule Take 40 mg by mouth daily.  . sertraline (ZOLOFT) 100 MG tablet Take 1 tablet (100 mg total) by mouth daily. For depression (Patient taking differently: Take 150 mg by mouth daily. For depression)  . topiramate (TOPAMAX) 25 MG tablet Take 1 tablet (25 mg total) by mouth 2 (two) times daily. (Patient taking differently: Take 50 mg by mouth 2 (two) times daily. )          Sexual History:   History  Sexual Activity  .  Sexual activity: Yes  . Birth control/ protection: Surgical    Psychiatric History:  While it is unclear about prior psychiatric history, the patient speaks of her prior life with regular references to alcohol and marijuana use.  Family Med/Psych History:  Family History  Problem Relation Age of Onset  . Alcohol abuse Father   . Alcoholism Father   . Cancer Other     Risk of Suicide/Violence: virtually non-existent The patient has not expressed any suicidal or homicidal ideations.    Impression/DX:  The patient has significant memory deficits.  She has severe anterograde and retrograde amnesia.  She is having great trouble learning new information and has developed an adaptive reponse of confabulation.  She is trying to make sense of what is happening around her and filling in blanks to reduce confusion and being overwhelmed by her inability understand due to memory deficits.   Disposition/Plan:  It is most likely that  interventions and interactions with patient should not just agree with her regarding her confabulations.  They should also not be aggressive challenged but should be redirected to simple but accurate redirections to facts and accurate descriptions of facts and current situation.  Diagnosis:    No diagnosis found.         Electronically Signed   _______________________ Ilean Skill, Psy.D.
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
## 2016-11-15 ENCOUNTER — Inpatient Hospital Stay (HOSPITAL_COMMUNITY): Payer: Self-pay

## 2016-11-15 ENCOUNTER — Inpatient Hospital Stay (HOSPITAL_COMMUNITY): Payer: Self-pay | Admitting: Physical Therapy

## 2016-11-15 ENCOUNTER — Inpatient Hospital Stay (HOSPITAL_COMMUNITY): Payer: Medicaid Other | Admitting: Speech Pathology

## 2016-11-15 NOTE — Plan of Care (Signed)
Problem: RH SAFETY Goal: RH STG ADHERE TO SAFETY PRECAUTIONS W/ASSISTANCE/DEVICE STG Adhere to Safety Precautions With mod  Assistance/Device.  Performing Q2H and PRN roundings while in restraints.

## 2016-11-15 NOTE — Progress Notes (Signed)
Physical Therapy Note  Patient Details  Name: Jacqueline [name] MRN: [id number] Date of Birth: 1966-04-12 Today's Date: 11/15/2016    Time: 830-926 56 minutes  1:1 No c/o pain.  Treatment continues to focus on cognitive goals of memory, attention, awareness and orientation. Pt oriented to self only, requires mod cuing for place and situation.  Pt performed nu step with good attention to task x 10 minutes with min cues to remember goal of 500 steps.  Money counting and simple math activity with pt able to count money with 100% accuracy, perform simple math of making change with 80% accuracy.  Puzzle activity continues to require mod/max cues due to attention declining at end of session.  Pt continues to perform all mobility at mod I level.   [name] 11/15/2016, 9:26 AM

## 2016-11-15 NOTE — Progress Notes (Signed)
Speech Language Pathology Daily Session Note  Patient Details  Name: Jacqueline [name] MRN: [id number] Date of Birth: 11-12-1965  Today's Date: 11/15/2016 SLP Individual Time: 1430-1500 SLP Individual Time Calculation (min): 30 min  Short Term Goals: Week 6: SLP Short Term Goal 1 (Week 6): Patient will demonstrate sustained attention to a task for 10 minutes with Max A multimodal cues.  SLP Short Term Goal 2 (Week 6): Patient will orient to place and situation with Mod A multimodal cues.  SLP Short Term Goal 3 (Week 6): Patient will demonstrate basic problem solving for self-care tasks with Mod A multimodal cues.  SLP Short Term Goal 4 (Week 6): Patient will identify 1 cognitive and 1 physical deficit with Mod A multimodal cues.  SLP Short Term Goal 5 (Week 6): Patient will consume trials of regular textures and demonstrate efficient mastication with complete oral clearance and without overt s/s of aspiration with Mod A verbal cues over 2 sessions prior to upgrade.   Skilled Therapeutic Interventions: Skilled treatment session focused on cognitive goals. SLP facilitated session by providing Max A multimodal cues for redirection due to severe confabulation, suspect due to recent family visit. Patient unable to participate in functional task this session due to decreased focused attention for ~30 second intervals due to verbal perseveration on finding the "general" so she can "go home." Patient left at RN station in [facility]. Continue with current plan of care.      Function:   Cognition Comprehension Comprehension assist level: Understands basic 75 - 89% of the time/ requires cueing 10 - 24% of the time  Expression   Expression assist level: Expresses basic 75 - 89% of the time/requires cueing 10 - 24% of the time. Needs helper to occlude trach/needs to repeat words.  Social Interaction Social Interaction assist level: Interacts appropriately 25 - 49% of time - Needs frequent redirection.   Problem Solving Problem solving assist level: Solves basic 25 - 49% of the time - needs direction more than half the time to initiate, plan or complete simple activities  Memory Memory assist level: Recognizes or recalls 25 - 49% of the time/requires cueing 50 - 75% of the time    Pain No/Denies Pain   Therapy/Group: Individual Therapy  Zuma Hust 11/15/2016, 3:05 PM

## 2016-11-15 NOTE — Progress Notes (Signed)
Social Work Patient ID: Phillis Haggis, female   DOB: Jan 01, 1966, 51 y.o.   MRN: [id number]   Have reviewed team conference with pt's mother and she understands that I continue to work on finding a RH/ ALF/ Family care home who can manage pt's care.  Have sent out pt's information to facilities in [place] and surrounding counties.  Will continue to work on this.  Natanya Holecek, LCSW

## 2016-11-15 NOTE — Progress Notes (Signed)
Patient perseverating on being discharged. Also restless, impulsive and demonstrating poor safety awareness this shift. Pt attempting to leave nursing station multiple times after unlocking the brakes on her recliner. Pt also hitting on the wall after being placed in posey bed for safety. As a result, posey bed had to be moved to the middle of the room. No response to any interventions implemented. Pt eventually deescalated after dinner.
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
## 2016-11-15 NOTE — Progress Notes (Signed)
Occupational Therapy Session Note  Patient Details  Name: Jacqueline [name] MRN: [id number] Date of Birth: [birth year]  Today's Date: 11/15/2016 OT Individual Time: [id number] OT Individual Time Calculation (min): 54 min    Short Term Goals: Week 5:  OT Short Term Goal 1 (Week 5): Pt will demonstrate improved orientation to identify that she is in a hospital with mod verbal cues OT Short Term Goal 2 (Week 5): Pt will attend to bathing tasks for 5 minutes with min verbal cues  OT Short Term Goal 3 (Week 5): Pt will initiate use of Memory Book to assist with recall of daily events with max verbal cues  Skilled Therapeutic Interventions/Progress Updates:    Pt standing at nursing station upon arrival.  Attempted to engage pt in eating breakfast, word search puzzles, and completing memory book.  Pt required tot A for orientation.  Pt would not actively engage in functional tasks.  Pt required max verbal cues for redirection to tasks.  Pt continues to perseverate on "quiting" and going home, requring max verbal cues to redirect. Pt remained in gerichair at nursing station.   Therapy Documentation Precautions:  Precautions Precautions: Cervical, Fall, Other (comment) Precaution Comments: abdominal incision Required Braces or Orthoses: Cervical Brace Cervical Brace: Hard collar, At all times Restrictions Weight Bearing Restrictions: No  Pain:  Pt denied pain  See Function Navigator for Current Functional Status.   Therapy/Group: Individual Therapy  Leroy Libman 11/15/2016, 7:55 AM

## 2016-11-16 ENCOUNTER — Inpatient Hospital Stay (HOSPITAL_COMMUNITY): Payer: Self-pay

## 2016-11-16 ENCOUNTER — Inpatient Hospital Stay (HOSPITAL_COMMUNITY): Payer: Medicaid Other | Admitting: Speech Pathology

## 2016-11-16 ENCOUNTER — Inpatient Hospital Stay (HOSPITAL_COMMUNITY): Payer: Self-pay | Admitting: Physical Therapy

## 2016-11-16 MED ORDER — HALOPERIDOL 2 MG PO TABS
1.0000 mg | ORAL_TABLET | Freq: Three times a day (TID) | ORAL | Status: DC | PRN
  Administered 2016-11-16 – 2016-11-27 (×8): 2 mg via ORAL
  Filled 2016-11-16 (×10): qty 1

## 2016-11-16 MED ORDER — CLONAZEPAM 0.5 MG PO TABS
1.0000 mg | ORAL_TABLET | Freq: Two times a day (BID) | ORAL | Status: DC
  Administered 2016-11-16 – 2016-11-19 (×6): 1 mg via ORAL
  Filled 2016-11-16 (×6): qty 2

## 2016-11-16 MED ORDER — HALOPERIDOL LACTATE 5 MG/ML IJ SOLN
1.0000 mg | Freq: Three times a day (TID) | INTRAMUSCULAR | Status: DC | PRN
  Administered 2016-11-29: 2 mg via INTRAMUSCULAR
  Filled 2016-11-16: qty 1

## 2016-11-16 NOTE — Progress Notes (Signed)
Pt's friend Juanda Crumble came to visit and the pt sat down and talked with him. RN gave her meds and pt went into nursing mode and RN explained each med to her and she understood each one. After her friend left she told the RN she had to use the bathroom and then she was going to bed. 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 She got in the bed and she enclosed the bed herself. Pt was very calm and cooperative when her friend was there and after.

## 2016-11-16 NOTE — Progress Notes (Signed)
Physical Therapy Note  Patient Details  Name: Jacqueline [name] MRN: [id number] Date of Birth: 06/17/1966 Today's Date: 11/16/2016    Time: 1330-1428 58 minutes  1:1 No c/o pain. Session focused on attention, awareness and orientation goals.  Pt requires mod/max cuing for attention to task especially in crowded environment.  Pt requires max cuing for orientation to place and situation this session.  Pt with improvement in ability to problem solve during puzzle task.  Continues to be mod I with mobility.   Mikaeel Petrow 11/16/2016, 2:29 PM

## 2016-11-16 NOTE — Progress Notes (Signed)
Occupational Therapy Weekly Progress Note  Patient Details  Name: Jacqueline [name] MRN: [id number] Date of Birth: 13-Nov-1965  Beginning of progress report period: November 09, 2016 End of progress report period: November 16, 2016  Patient has met 0 of 3 short term goals.  Pt's progress has been minimal and inconsistent during the past week.  Pt continues to require tot A for orientation and to use her Memory Book to assist.  Pt continues to require max verbal cues for task initiation and attention to task.  Pt's conversation is very tangential and not based on current reality.  Pt consistently perseverates on going home and requires max verbal cues for redirection.  Pt's behaviors are consistent with [place] Level V. Pt also demonstrated decreased standing balance, delayed balance reactions that are more severe when she is fatigued.   Patient continues to demonstrate the following deficits: decreased visual acuity, decreased initiation, decreased attention, decreased awareness, decreased problem solving, decreased safety awareness, decreased memory and delayed processing and decreased standing balance and decreased balance strategies and therefore will continue to benefit from skilled OT intervention to enhance overall performance with iADL.  Patient not progressing toward long term goals. Goals are currently set at supervision, which she can do inconsistently. Memory is mod A.  Goals need to stay at the same level..  Continue plan of care with a focus on consistency of performance of ADLs, balance, cognition.  OT Short Term Goals Week 5:  OT Short Term Goal 1 (Week 5): Pt will demonstrate improved orientation to identify that she is in a hospital with mod verbal cues OT Short Term Goal 1 - Progress (Week 5): Progressing toward goal OT Short Term Goal 2 (Week 5): Pt will attend to bathing tasks for 5 minutes with min verbal cues  OT Short Term Goal 2 - Progress (Week 5): Progressing toward goal OT  Short Term Goal 3 (Week 5): Pt will initiate use of Memory Book to assist with recall of daily events with max verbal cues OT Short Term Goal 3 - Progress (Week 5): Progressing toward goal Week 6:  OT Short Term Goal 1 (Week 6): Pt will demonstrate improved orientation to identify that she is in a hospital with mod verbal cues OT Short Term Goal 2 (Week 6): Pt will attend to bathing tasks for 5 minutes with min verbal cues  OT Short Term Goal 3 (Week 6): Pt will initiate use of Memory Book to assist with recall of daily events with max verbal cues   Therapy Documentation Precautions:  Precautions Precautions: Cervical, Fall, Other (comment) Precaution Comments: abdominal incision Required Braces or Orthoses: Cervical Brace Cervical Brace: Hard collar, At all times Restrictions Weight Bearing Restrictions: No  See Function Navigator for Current Functional Status.    [name] [facility] 11/16/2016, 6:48 AM

## 2016-11-16 NOTE — Progress Notes (Signed)
[place] PHYSICAL MEDICINE & REHABILITATION     PROGRESS NOTE    Subjective/Complaints: Pt more agitated and restless over last 24 hours. Grandchild born/family visited. Determined to go home.   ROS: pt denies nausea, vomiting, diarrhea, cough, shortness of breath or chest pain    Objective: Vital Signs: Blood pressure 104/65, pulse 87, temperature 98.5 F (36.9 C), temperature source Oral, resp. rate 20, weight 80.7 kg (178 lb), SpO2 97 %. No results found.  Recent Labs  11/14/16 0854  WBC 5.7  HGB 11.5*  HCT 36.7  PLT 258    Recent Labs  11/14/16 0854  NA 136  K 4.0  CL 103  GLUCOSE 90  BUN <5*  CREATININE 0.73  CALCIUM 8.6*   CBG (last 3)  No results for input(s): GLUCAP in the last 72 hours.  Wt Readings from Last 3 Encounters:  11/16/16 80.7 kg (178 lb)  10/09/16 71.4 kg (157 lb 4.8 oz)  08/03/16 93 kg (205 lb)    Physical Exam:  Gen: NAD. Vital signs reviewed Eyes: No discharge. EOMI. Vision poor Cardiovascular: RRR Respiratory: CTA GI: Soft, non-distended. Ab incision closing with hypergranulation present Musc: No edema, no tenderness Neurological: She is alert. Continues to confabulate Motor: moves all 4 ext. Coordination improving Skin: dry  Psych: confused, oriented to self and hospital, restless but not agitated  Assessment/Plan: 1. Functional, cognitive and behavioral deficits secondary to severe TBI which require 3+ hours per day of interdisciplinary therapy in a comprehensive inpatient rehab setting. Physiatrist is providing close team supervision and 24 hour management of active medical problems listed below. Physiatrist and rehab team continue to assess barriers to discharge/monitor patient progress toward functional and medical goals.  Function:  Bathing Bathing position Bathing activity did not occur: Refused Position: Production manager parts bathed by patient: Right arm, Left arm, Chest, Front perineal area, Buttocks,  Right upper leg, Left upper leg, Right lower leg, Left lower leg, Back Body parts bathed by helper: Right arm, Left arm, Front perineal area, Buttocks, Right upper leg, Left upper leg, Right lower leg, Left lower leg, Back  Bathing assist Assist Level: Supervision or verbal cues (max  cues)      Upper Body Dressing/Undressing Upper body dressing   What is the patient wearing?: Pull over shirt/dress     Pull over shirt/dress - Perfomed by patient: Thread/unthread right sleeve, Thread/unthread left sleeve, Put head through opening, Pull shirt over trunk Pull over shirt/dress - Perfomed by helper: Thread/unthread right sleeve, Thread/unthread left sleeve, Put head through opening, Pull shirt over trunk        Upper body assist Assist Level: Supervision or verbal cues      Lower Body Dressing/Undressing Lower body dressing   What is the patient wearing?: Pants Underwear - Performed by patient: Thread/unthread right underwear leg, Thread/unthread left underwear leg, Pull underwear up/down   Pants- Performed by patient: Thread/unthread right pants leg, Thread/unthread left pants leg, Pull pants up/down Pants- Performed by helper: Pull pants up/down Non-skid slipper socks- Performed by patient: Don/doff right sock, Don/doff left sock Non-skid slipper socks- Performed by helper: Don/doff right sock, Don/doff left sock Socks - Performed by patient: Don/doff right sock, Don/doff left sock   Shoes - Performed by patient: Don/doff right shoe, Don/doff left shoe            Lower body assist Assist for lower body dressing: Supervision or verbal cues      Toileting Toileting   Toileting steps  completed by patient: Adjust clothing prior to toileting, Performs perineal hygiene, Adjust clothing after toileting Toileting steps completed by helper: Performs perineal hygiene Toileting Assistive Devices: Grab bar or rail  Toileting assist Assist level: Supervision or verbal cues    Transfers Chair/bed transfer   Chair/bed transfer method: Ambulatory Chair/bed transfer assist level: Supervision or verbal cues Chair/bed transfer assistive device: Armrests     Locomotion Ambulation     Max distance: >150 ft Assist level: Supervision or verbal cues   Wheelchair   Type: Manual Max wheelchair distance: 110' Assist Level: Supervision or verbal cues  Cognition Comprehension Comprehension assist level: Understands basic 75 - 89% of the time/ requires cueing 10 - 24% of the time  Expression Expression assist level: Expresses basic 75 - 89% of the time/requires cueing 10 - 24% of the time. Needs helper to occlude trach/needs to repeat words.  Social Interaction Social Interaction assist level: Interacts appropriately 25 - 49% of time - Needs frequent redirection.  Problem Solving Problem solving assist level: Solves basic 25 - 49% of the time - needs direction more than half the time to initiate, plan or complete simple activities  Memory Memory assist level: Recognizes or recalls 25 - 49% of the time/requires cueing 50 - 75% of the time   Medical Problem List and Plan: 1. Decreased functional mobility  secondary to TBI/large right occipital epidural hematoma, mildly displaced right temporal bone fracture,Posterior cervical spine ligamentous injury with cervical collar, multiple rib fractures after walking in front of a bus             -continue CIR, PT, OT, speech             -RLAS V+    -recent increase in agitation related to environmental factors/family as well as decrease of medications. 2.  DVT Prophylaxis/Anticoagulation: SCDs. Dopplers negative 3. Pain Management: Hycet as needed 4. Mood/agitation related to traumatic brain injury:   -sleep/wake better in general  -continue risperdal at 2mg  qhs.  Consider increasing day time back to 1mg  if agitation continues  -celexa 40mg  qhs  -Klonopin: increase back to 1 mg  BID today  - depakote  Decreased to 500mg   TID  on 2/7 given supratherapeutic level--recheck level Monday  -continue nicotine patch  - librium 10mg  QHS back   -haldol prn for severe agitation  -team working on consistent use of environmental cues/mods as possible for behavior/cognition   -unzipped enclosure bed when possible--  5. Neuropsych: This patient is not capable of making decisions on her own behalf.             -poor judgment and awareness 6. Skin/Wound Care: wound clean and continues closing  -Surgery signed off for now. abx completed  -continue wet to dry dressings daily--consider silver nitrate to wound 7. Fluids/Electrolytes/Nutrition:   -potassium 4.0       8. Seizure prophylaxis. Keppra 500 mg twice a day --decreased to 250mg  9. Dysphagia with decreased nutritional storage. D3 thins now 10. 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   -decannulated   11. 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 Acute blood loss anemia. hgb 10.4  hgb 9.9 13. Polysubstance abuse. Provide counseling when appropriate   LOS (Days) Kidder T, MD 11/16/2016 10:00 AM

## 2016-11-16 NOTE — Progress Notes (Signed)
Pt increasingly agitated this afternoon with repeated attempts to leave the unit. RN not able to redirect patient with activities or puzzles. Pt becoming increasingly agitated with attempts to direct her back to bed with episodes of yelling and screaming. Pt given Haldol for agitation. Pt toileted and completed dinner. Pt put back in enclosure bed. Will continue to monitor.

## 2016-11-16 NOTE — Progress Notes (Signed)
Physical Therapy Note  Patient Details  Name: TERYN [name] MRN: [id number] Date of Birth: 1966/05/20 Today's Date: 11/16/2016    Pt's POC has been changed to recieve each therapy QD per discussion in team conference 11/13/2016.   DONAWERTH,KAREN 11/16/2016, 8:19 AM

## 2016-11-16 NOTE — Progress Notes (Signed)
Speech Language Pathology Daily Session Note  Patient Details  Name: HYUN RANDT MRN: [id number] Date of Birth: 08-Jun-1966  Today's Date: 11/16/2016 SLP Individual Time: 0930-1000 SLP Individual Time Calculation (min): 30 min  Short Term Goals: Week 6: SLP Short Term Goal 1 (Week 6): Patient will demonstrate sustained attention to a task for 10 minutes with Max A multimodal cues.  SLP Short Term Goal 2 (Week 6): Patient will orient to place and situation with Mod A multimodal cues.  SLP Short Term Goal 3 (Week 6): Patient will demonstrate basic problem solving for self-care tasks with Mod A multimodal cues.  SLP Short Term Goal 4 (Week 6): Patient will identify 1 cognitive and 1 physical deficit with Mod A multimodal cues.  SLP Short Term Goal 5 (Week 6): Patient will consume trials of regular textures and demonstrate efficient mastication with complete oral clearance and without overt s/s of aspiration with Mod A verbal cues over 2 sessions prior to upgrade.   Skilled Therapeutic Interventions: Skilled treatment session focused on cognitive goals. SLP facilitated session by providing Mod A verbal cues for sustained attention to a task of interest for 5 minutes. Patient with increased confabulation today that was unable to be redirected despite multiple attempts and Max A. Patient left upright in [place] with RN present. Continue with current plan of care.      Function:  Eating Eating   Modified Consistency Diet: Yes Eating Assist Level: More than reasonable amount of time;Supervision or verbal cues           Cognition Comprehension Comprehension assist level: Understands basic 75 - 89% of the time/ requires cueing 10 - 24% of the time  Expression   Expression assist level: Expresses basic 75 - 89% of the time/requires cueing 10 - 24% of the time. Needs helper to occlude trach/needs to repeat words.  Social Interaction Social Interaction assist level: Interacts appropriately  25 - 49% of time - Needs frequent redirection.  Problem Solving Problem solving assist level: Solves basic 25 - 49% of the time - needs direction more than half the time to initiate, plan or complete simple activities  Memory Memory assist level: Recognizes or recalls 25 - 49% of the time/requires cueing 50 - 75% of the time    Pain No/Denies Pain  Therapy/Group: Individual Therapy  Marcas Bowsher 11/16/2016, 3:09 PM

## 2016-11-16 NOTE — Progress Notes (Signed)
Occupational Therapy Session Note  Patient Details  Name: Jacqueline [name] MRN: [id number] Date of Birth: 23-Aug-1966  Today's Date: 11/16/2016 OT Individual Time: 0700-0800 OT Individual Time Calculation (min): 60 min    Short Term Goals: Week 6:  OT Short Term Goal 1 (Week 6): Pt will demonstrate improved orientation to identify that she is in a hospital with mod verbal cues OT Short Term Goal 2 (Week 6): Pt will attend to bathing tasks for 5 minutes with min verbal cues  OT Short Term Goal 3 (Week 6): Pt will initiate use of Memory Book to assist with recall of daily events with max verbal cues  Skilled Therapeutic Interventions/Progress Updates:    Pt resting in bed upon arrival and requested to get up.  Pt sat in chair and initially started to eat breakfast before stating that she wasn't hungry.  Pt required max verbal cues to locate condiments (sugar and creamer) to sweeten her coffee.  Pt also mixed her orange juice with her Boost drink.  Pt repeatedly picked up items on her tray to "examine" them.  Pt consistently confused therapist with a childhood friend and became frustrated/agitated when challenged/corrected.  Pt required tot A for orientation.  After approx 45 mins pt stood from chair and stated she was going home and "you can't stop me." Pt walked into hallway towards stairs exit.  Verbal redirections and deescalating strategies were unsuccessful and physical redirection was required bu therapist and other staff.  Pt returned to enclosure bed and secured.   Therapy Documentation Precautions:  Precautions Precautions: Cervical, Fall, Other (comment) Precaution Comments: abdominal incision Required Braces or Orthoses: Cervical Brace Cervical Brace: Hard collar, At all times Restrictions Weight Bearing Restrictions: No   Pain: Pain Assessment Pain Assessment: No/denies pain  See Function Navigator for Current Functional Status.   Therapy/Group: Individual  Therapy  Leroy Libman 11/16/2016, 12:03 PM

## 2016-11-17 ENCOUNTER — Inpatient Hospital Stay (HOSPITAL_COMMUNITY): Payer: Self-pay | Admitting: Physical Therapy

## 2016-11-17 ENCOUNTER — Inpatient Hospital Stay (HOSPITAL_COMMUNITY): Payer: Medicaid Other | Admitting: Speech Pathology

## 2016-11-17 DIAGNOSIS — Z2989 Encounter for other specified prophylactic measures: Secondary | ICD-10-CM

## 2016-11-17 DIAGNOSIS — Z298 Encounter for other specified prophylactic measures: Secondary | ICD-10-CM

## 2016-11-17 DIAGNOSIS — R451 Restlessness and agitation: Secondary | ICD-10-CM

## 2016-11-17 NOTE — Progress Notes (Signed)
Physical Therapy Session Note  Patient Details  Name: Jacqueline [name] MRN: [id number] Date of Birth: July 12, 1966  Today's Date: 11/17/2016 PT Individual Time: 1135-1200 PT Individual Time Calculation (min): 25 min   Short Term Goals: Week 6:  PT Short Term Goal 1 (Week 6): Pt will be oriented to location 75% of the time. PT Short Term Goal 2 (Week 6): Pt will consistently demonstrate intellectual awawreness with mod cuing.  Skilled Therapeutic Interventions/Progress Updates:      Pt received sitting in geri chair and agreeable to PT. Patient ambulated to room on unit. Oriented to city and fact that she is in a hospital. Patient noted several bags in corner of room and concluded that those were travel bags, and that she needed to finish packing and leave. PT spent rest of session re-orienting patient to situation. While walking to gym, elevators opened and patient got into elevator stating that she "quit and had to go" 2 person assist to direct patient back to room. Once in room, patient remained unreasonable and unable to re-orient; 2 person assist required to place patient in Posey bed.    Therapy Documentation Precautions:  Precautions Precautions: Cervical, Fall, Other (comment) Precaution Comments: abdominal incision Required Braces or Orthoses: Cervical Brace Cervical Brace: Hard collar, At all times Restrictions Weight Bearing Restrictions: No Pain: 0.10   See Function Navigator for Current Functional Status.   Therapy/Group: Individual Therapy  Lorie Phenix 11/17/2016, 1:46 PM

## 2016-11-17 NOTE — Progress Notes (Signed)
Speech Language Pathology Daily Session Note  Patient Details  Name: Jacqueline [name] MRN: [id number] Date of Birth: 10/02/1966  Today's Date: 11/17/2016 SLP Individual Time: 1230-1300 SLP Individual Time Calculation (min): 30 min  Short Term Goals: Week 6: SLP Short Term Goal 1 (Week 6): Patient will demonstrate sustained attention to a task for 10 minutes with Max A multimodal cues.  SLP Short Term Goal 2 (Week 6): Patient will orient to place and situation with Mod A multimodal cues.  SLP Short Term Goal 3 (Week 6): Patient will demonstrate basic problem solving for self-care tasks with Mod A multimodal cues.  SLP Short Term Goal 4 (Week 6): Patient will identify 1 cognitive and 1 physical deficit with Mod A multimodal cues.  SLP Short Term Goal 5 (Week 6): Patient will consume trials of regular textures and demonstrate efficient mastication with complete oral clearance and without overt s/s of aspiration with Mod A verbal cues over 2 sessions prior to upgrade.   Skilled Therapeutic Interventions: Skilled treatment session focused on cognition goals. SLP facilitated session by proving Max A multimodal cues for sustained attention to eating lunch for ~15 minutes with son present. With max a verbal cues, pt able to decreased making comments about going home. Pt consumed trials of regular with mild increased oral prep time, but managed well. Pt required Max a cues to converse with son with correct orientation. Pt was left in her room, upright in recliner with son playing connect four with her.      Function:  Eating Eating   Modified Consistency Diet: Yes Eating Assist Level: More than reasonable amount of time;Supervision or verbal cues   Eating Set Up Assist For: Opening containers       Cognition Comprehension Comprehension assist level: Understands basic 75 - 89% of the time/ requires cueing 10 - 24% of the time  Expression   Expression assist level: Expresses basic 75 - 89% of  the time/requires cueing 10 - 24% of the time. Needs helper to occlude trach/needs to repeat words.  Social Interaction Social Interaction assist level: Interacts appropriately 25 - 49% of time - Needs frequent redirection.  Problem Solving Problem solving assist level: Solves basic 25 - 49% of the time - needs direction more than half the time to initiate, plan or complete simple activities  Memory Memory assist level: Recognizes or recalls 25 - 49% of the time/requires cueing 50 - 75% of the time    Pain    Therapy/Group: Individual Therapy  Sible Straley B. Rutherford Nail, M.S., CCC-SLP Speech-Language Pathologist  [name] 11/17/2016, 1:14 PM

## 2016-11-17 NOTE — Progress Notes (Signed)
[facility] PHYSICAL MEDICINE & REHABILITATION     PROGRESS NOTE    Subjective/Complaints: Pt seen ambulating this AM and later at nurses station.  She states she wants to quit this job.  Pt slept well overnight per nursing.   ROS: ?Reliability, but denies nausea, vomiting, diarrhea, shortness of breath or chest pain    Objective: Vital Signs: Blood pressure (!) 85/52, pulse 84, temperature 98.3 F (36.8 C), temperature source Oral, resp. rate 17, weight 80.7 kg (178 lb), SpO2 96 %. No results found. No results for input(s): WBC, HGB, HCT, PLT in the last 72 hours. No results for input(s): NA, K, CL, GLUCOSE, BUN, CREATININE, CALCIUM in the last 72 hours.  Invalid input(s): CO CBG (last 3)  No results for input(s): GLUCAP in the last 72 hours.  Wt Readings from Last 3 Encounters:  11/16/16 80.7 kg (178 lb)  10/09/16 71.4 kg (157 lb 4.8 oz)  08/03/16 93 kg (205 lb)    Physical Exam:  Gen: NAD. Vital signs reviewed Eyes: No discharge. EOMI. Vision poor Cardiovascular: RRR Respiratory: CTA GI: Soft, non-distended. Ab incision closing with hypergranulation present Musc: No edema, no tenderness Neurological: She is alert. Continues to confabulate Motor: moves all 4 ext. Coordination improving Skin: dry  Psych: confused, oriented to self and hospital, restless but not agitated  Assessment/Plan: 1. Functional, cognitive and behavioral deficits secondary to severe TBI which require 3+ hours per day of interdisciplinary therapy in a comprehensive inpatient rehab setting. Physiatrist is providing close team supervision and 24 hour management of active medical problems listed below. Physiatrist and rehab team continue to assess barriers to discharge/monitor patient progress toward functional and medical goals.  Function:  Bathing Bathing position Bathing activity did not occur: Refused Position: Production manager parts bathed by patient: Right arm, Left arm, Chest,  Front perineal area, Buttocks, Right upper leg, Left upper leg, Right lower leg, Left lower leg, Back Body parts bathed by helper: Right arm, Left arm, Front perineal area, Buttocks, Right upper leg, Left upper leg, Right lower leg, Left lower leg, Back  Bathing assist Assist Level: Supervision or verbal cues (max  cues)      Upper Body Dressing/Undressing Upper body dressing   What is the patient wearing?: Pull over shirt/dress     Pull over shirt/dress - Perfomed by patient: Thread/unthread right sleeve, Thread/unthread left sleeve, Put head through opening, Pull shirt over trunk Pull over shirt/dress - Perfomed by helper: Thread/unthread right sleeve, Thread/unthread left sleeve, Put head through opening, Pull shirt over trunk        Upper body assist Assist Level: Supervision or verbal cues      Lower Body Dressing/Undressing Lower body dressing   What is the patient wearing?: Pants Underwear - Performed by patient: Thread/unthread right underwear leg, Thread/unthread left underwear leg, Pull underwear up/down   Pants- Performed by patient: Thread/unthread right pants leg, Thread/unthread left pants leg, Pull pants up/down Pants- Performed by helper: Pull pants up/down Non-skid slipper socks- Performed by patient: Don/doff right sock, Don/doff left sock Non-skid slipper socks- Performed by helper: Don/doff right sock, Don/doff left sock Socks - Performed by patient: Don/doff right sock, Don/doff left sock   Shoes - Performed by patient: Don/doff right shoe, Don/doff left shoe            Lower body assist Assist for lower body dressing: Supervision or verbal cues      Toileting Toileting   Toileting steps completed by patient: Adjust clothing  prior to toileting, Performs perineal hygiene, Adjust clothing after toileting Toileting steps completed by helper: Performs perineal hygiene Toileting Assistive Devices: Grab bar or rail  Toileting assist Assist level: Supervision  or verbal cues   Transfers Chair/bed transfer   Chair/bed transfer method: Ambulatory Chair/bed transfer assist level: Supervision or verbal cues Chair/bed transfer assistive device: Armrests     Locomotion Ambulation     Max distance: >150 ft Assist level: Supervision or verbal cues   Wheelchair   Type: Manual Max wheelchair distance: 110' Assist Level: Supervision or verbal cues  Cognition Comprehension Comprehension assist level: Understands basic 75 - 89% of the time/ requires cueing 10 - 24% of the time  Expression Expression assist level: Expresses basic 75 - 89% of the time/requires cueing 10 - 24% of the time. Needs helper to occlude trach/needs to repeat words.  Social Interaction Social Interaction assist level: Interacts appropriately 25 - 49% of time - Needs frequent redirection.  Problem Solving Problem solving assist level: Solves basic 25 - 49% of the time - needs direction more than half the time to initiate, plan or complete simple activities  Memory Memory assist level: Recognizes or recalls 25 - 49% of the time/requires cueing 50 - 75% of the time   Medical Problem List and Plan: 1. Decreased functional mobility  secondary to TBI/large right occipital epidural hematoma, mildly displaced right temporal bone fracture,Posterior cervical spine ligamentous injury with cervical collar, multiple rib fractures after walking in front of a bus             -continue CIR             -RLAS >/ IV  -recent increase in agitation related to environmental factors/family as well as decrease of medications. 2.  DVT Prophylaxis/Anticoagulation: SCDs. Dopplers negative 3. Pain Management: Hycet as needed 4. Mood/agitation related to traumatic brain injury:   -sleep/wake better in general  -continue risperdal at 2mg  qhs.  Consider increasing day time back to 1mg  if agitation continues  -celexa 40mg  qhs  -Klonopin: increase back to 1 mg  BID   - depakote  Decreased to 500mg   TID on  2/7 given supratherapeutic level--recheck level Monday  -continue nicotine patch  -librium 10mg  QHS back   -haldol prn for severe agitation  -team working on consistent use of environmental cues/mods as possible for behavior/cognition   -unzipped enclosure bed when possible  5. Neuropsych: This patient is not capable of making decisions on her own behalf.             -poor judgment and awareness 6. Skin/Wound Care: wound clean and continues closing  -Surgery signed off for now. abx completed  -continue wet to dry dressings daily--consider silver nitrate to wound 7. Fluids/Electrolytes/Nutrition:   -potassium 4.0 on 2/7 8. Seizure prophylaxis. Keppra 500 mg twice a day --decreased to 250mg  9. Dysphagia with decreased nutritional storage. Advanced to D3 thins now 10. 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   -decannulated   11. 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 Acute blood loss anemia. hgb 10.4  hgb 11.5 on 2/7 13. Polysubstance abuse. Provide counseling when appropriate   LOS (Days) [place] EVALUATION WAS PERFORMED  Jacqueline [name] Lorie Phenix, MD 11/17/2016 10:22 AM

## 2016-11-17 NOTE — Progress Notes (Signed)
11/17/16 1145 nursing Patient was with therapy and went straight to elevator claims" I want to go home" Patient was redirected by PT, RN , NT and Renee. Patient came back to unit.Patient's son from [place]  was here to visit so she calmed down. Wander guard missing and placed back. Door closed to the unit. Continued to monitor.

## 2016-11-17 NOTE — Progress Notes (Signed)
Patient very agitated,agressive wanted to go through the elevator, couldn't follow any command. Refused to go to bed insisting that she want to go home. Tried to redirect her but couldn't listen.

## 2016-11-18 ENCOUNTER — Inpatient Hospital Stay (HOSPITAL_COMMUNITY): Payer: Medicaid Other | Admitting: Physical Therapy

## 2016-11-18 NOTE — Progress Notes (Signed)
[facility] PHYSICAL MEDICINE & REHABILITATION     PROGRESS NOTE    Subjective/Complaints: Pt seen laying in her bed this AM.  Per nursing she slept well overnight.     ROS: ?Reliability, but denies nausea, vomiting, diarrhea, shortness of breath or chest pain    Objective: Vital Signs: Blood pressure 109/74, pulse 76, temperature 97.7 F (36.5 C), temperature source Oral, resp. rate 18, weight 80.5 kg (177 lb 7.5 oz), SpO2 99 %. No results found. No results for input(s): WBC, HGB, HCT, PLT in the last 72 hours. No results for input(s): NA, K, CL, GLUCOSE, BUN, CREATININE, CALCIUM in the last 72 hours.  Invalid input(s): CO CBG (last 3)  No results for input(s): GLUCAP in the last 72 hours.  Wt Readings from Last 3 Encounters:  11/18/16 80.5 kg (177 lb 7.5 oz)  10/09/16 71.4 kg (157 lb 4.8 oz)  08/03/16 93 kg (205 lb)    Physical Exam:  Gen: NAD. Vital signs reviewed Eyes: No discharge. EOMI. Cardiovascular: RRR. No JVD. Respiratory: CTA. Unlabored.  GI: Soft, non-distended.  Musc: No edema, no tenderness Neurological: She is alert. Continues to confabulate Motor: moves all 4 ext. Coordination improving Skin: dry. Intact.  Psych: confused, agitated.  Assessment/Plan: 1. Functional, cognitive and behavioral deficits secondary to severe TBI which require 3+ hours per day of interdisciplinary therapy in a comprehensive inpatient rehab setting. Physiatrist is providing close team supervision and 24 hour management of active medical problems listed below. Physiatrist and rehab team continue to assess barriers to discharge/monitor patient progress toward functional and medical goals.  Function:  Bathing Bathing position Bathing activity did not occur: Refused Position: Production manager parts bathed by patient: Right arm, Left arm, Chest, Front perineal area, Buttocks, Right upper leg, Left upper leg, Right lower leg, Left lower leg, Back Body parts bathed by  helper: Right arm, Left arm, Front perineal area, Buttocks, Right upper leg, Left upper leg, Right lower leg, Left lower leg, Back  Bathing assist Assist Level: Supervision or verbal cues (max  cues)      Upper Body Dressing/Undressing Upper body dressing   What is the patient wearing?: Pull over shirt/dress     Pull over shirt/dress - Perfomed by patient: Thread/unthread right sleeve, Thread/unthread left sleeve, Put head through opening, Pull shirt over trunk Pull over shirt/dress - Perfomed by helper: Thread/unthread right sleeve, Thread/unthread left sleeve, Put head through opening, Pull shirt over trunk        Upper body assist Assist Level: Supervision or verbal cues      Lower Body Dressing/Undressing Lower body dressing   What is the patient wearing?: Pants Underwear - Performed by patient: Pull underwear up/down   Pants- Performed by patient: Pull pants up/down Pants- Performed by helper: Pull pants up/down Non-skid slipper socks- Performed by patient: Don/doff right sock, Don/doff left sock Non-skid slipper socks- Performed by helper: Don/doff right sock, Don/doff left sock Socks - Performed by patient: Don/doff right sock, Don/doff left sock   Shoes - Performed by patient: Don/doff right shoe, Don/doff left shoe            Lower body assist Assist for lower body dressing: Supervision or verbal cues      Toileting Toileting   Toileting steps completed by patient: Adjust clothing prior to toileting, Performs perineal hygiene, Adjust clothing after toileting Toileting steps completed by helper: Performs perineal hygiene Toileting Assistive Devices: Grab bar or rail  Toileting assist Assist level: Supervision or  verbal cues   Transfers Chair/bed transfer   Chair/bed transfer method: Ambulatory Chair/bed transfer assist level: Supervision or verbal cues Chair/bed transfer assistive device: Armrests     Locomotion Ambulation     Max distance: 150 ft Assist  level: Supervision or verbal cues   Wheelchair   Type: Manual Max wheelchair distance: 110' Assist Level: Supervision or verbal cues  Cognition Comprehension Comprehension assist level: Understands basic 75 - 89% of the time/ requires cueing 10 - 24% of the time  Expression Expression assist level: Expresses basic 75 - 89% of the time/requires cueing 10 - 24% of the time. Needs helper to occlude trach/needs to repeat words.  Social Interaction Social Interaction assist level: Interacts appropriately 50 - 74% of the time - May be physically or verbally inappropriate.  Problem Solving Problem solving assist level: Solves basic 75 - 89% of the time/requires cueing 10 - 24% of the time  Memory Memory assist level: Recognizes or recalls 25 - 49% of the time/requires cueing 50 - 75% of the time   Medical Problem List and Plan: 1. Decreased functional mobility  secondary to TBI/large right occipital epidural hematoma, mildly displaced right temporal bone fracture,Posterior cervical spine ligamentous injury with cervical collar, multiple rib fractures after walking in front of a bus             -continue CIR             -RLAS >/ IV 2.  DVT Prophylaxis/Anticoagulation: SCDs. Dopplers negative 3. Pain Management: Hycet as needed 4. Mood/agitation related to traumatic brain injury:   -sleep/wake better in general  -continue risperdal at 2mg  qhs.  Consider increasing day time back to 1mg  if agitation continues  -celexa 40mg  qhs  -Klonopin: increase back to 1 mg  BID   - depakote  Decreased to 500mg   TID on 2/7 given supratherapeutic level--recheck level tomorrow  -continue nicotine patch  -librium 10mg  QHS back   -haldol prn for severe agitation  -team working on consistent use of environmental cues/mods as possible for behavior/cognition   -unzipped enclosure bed when possible   -May need further increase of meds due to increased agitation 5. Neuropsych: This patient is not capable of making  decisions on her own behalf.             -poor judgment and awareness 6. Skin/Wound Care: wound clean and continues closing  -Surgery signed off for now. abx completed  -continue wet to dry dressings daily--consider silver nitrate to wound 7. Fluids/Electrolytes/Nutrition:   -potassium 4.0 on 2/7 8. Seizure prophylaxis. Keppra 500 mg twice a day --decreased to 250mg  9. Dysphagia with decreased nutritional storage. Advanced to D3 thins now 10. 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   -decannulated   11. 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 Acute blood loss anemia  hgb 11.5 on 2/7 13. Polysubstance abuse. Provide counseling when appropriate   LOS (Days) [place] PERFORMED  [name], MD 11/18/2016 3:09 PM

## 2016-11-18 NOTE — Progress Notes (Signed)
Physical Therapy Session Note  Patient Details  Name: Jacqueline [name] MRN: [id number] Date of Birth: 10-23-1965  Today's Date: 11/18/2016 PT Individual Time: [id number] PT Individual Time Calculation (min): 44 min   Short Term Goals: Week 6:  PT Short Term Goal 1 (Week 6): Pt will be oriented to location 75% of the time. PT Short Term Goal 2 (Week 6): Pt will consistently demonstrate intellectual awawreness with mod cuing.  Skilled Therapeutic Interventions/Progress Updates:  Pt received in geri-chair at nurses station. Session focused on cognitive remediation and orientation. Pt assembled puzzle with min cuing for problem solving and error correction. Pt required max cuing to focus attention to task, but once she did she was able to attend to task for ~20 minutes without cuing. Pt requires max cuing for orientation to location & situation. Pt repeatedly stating we are in [place] despite max education. At end of session pt left using bathroom in room in care of NT.  Therapy Documentation Precautions:  Precautions Precautions: Cervical, Fall, Other (comment) Precaution Comments: abdominal incision Required Braces or Orthoses: Cervical Brace Cervical Brace: Hard collar, At all times Restrictions Weight Bearing Restrictions: No   See Function Navigator for Current Functional Status.   Therapy/Group: Individual Therapy  [name] 11/18/2016, 2:40 PM

## 2016-11-19 ENCOUNTER — Inpatient Hospital Stay (HOSPITAL_COMMUNITY): Payer: Self-pay

## 2016-11-19 ENCOUNTER — Inpatient Hospital Stay (HOSPITAL_COMMUNITY): Payer: Medicaid Other | Admitting: Physical Therapy

## 2016-11-19 ENCOUNTER — Inpatient Hospital Stay (HOSPITAL_COMMUNITY): Payer: Medicaid Other | Admitting: Speech Pathology

## 2016-11-19 LAB — VALPROIC ACID LEVEL: Valproic Acid Lvl: 89 ug/mL (ref 50.0–100.0)

## 2016-11-19 MED ORDER — CLONAZEPAM 0.5 MG PO TABS
1.0000 mg | ORAL_TABLET | Freq: Three times a day (TID) | ORAL | Status: DC
  Administered 2016-11-19 – 2016-11-29 (×31): 1 mg via ORAL
  Filled 2016-11-19 (×31): qty 2

## 2016-11-19 NOTE — Progress Notes (Signed)
[name] PHYSICAL MEDICINE & REHABILITATION     PROGRESS NOTE    Subjective/Complaints: Up with staff. No real changes over weekend. Attempted to leave unit once or twice.   ROS: Limited due cognitive/behavioral     Objective: Vital Signs: Blood pressure 99/80, pulse 83, temperature 98 F (36.7 C), temperature source Oral, resp. rate 18, weight 80.2 kg (176 lb 14.4 oz), SpO2 98 %. No results found. No results for input(s): WBC, HGB, HCT, PLT in the last 72 hours. No results for input(s): NA, K, CL, GLUCOSE, BUN, CREATININE, CALCIUM in the last 72 hours.  Invalid input(s): CO CBG (last 3)  No results for input(s): GLUCAP in the last 72 hours.  Wt Readings from Last 3 Encounters:  11/19/16 80.2 kg (176 lb 14.4 oz)  10/09/16 71.4 kg (157 lb 4.8 oz)  08/03/16 93 kg (205 lb)    Physical Exam:  Gen: NAD. Vital signs reviewed Eyes: No discharge. EOMI. Cardiovascular: RRR. No JVD. Respiratory: CTA. Unlabored.  GI: Soft, non-distended.  Musc: No edema, no tenderness Neurological: She is alert. Continues to confabulate Motor: moves all 4 ext. Coordination improving Skin: dry. Intact.  Psych: confused, agitated.  Assessment/Plan: 1. Functional, cognitive and behavioral deficits secondary to severe TBI which require 3+ hours per day of interdisciplinary therapy in a comprehensive inpatient rehab setting. Physiatrist is providing close team supervision and 24 hour management of active medical problems listed below. Physiatrist and rehab team continue to assess barriers to discharge/monitor patient progress toward functional and medical goals.  Function:  Bathing Bathing position Bathing activity did not occur: Refused Position: Production manager parts bathed by patient: Right arm, Left arm, Chest, Front perineal area, Buttocks, Right upper leg, Left upper leg, Right lower leg, Left lower leg, Back Body parts bathed by helper: Right arm, Left arm, Front perineal  area, Buttocks, Right upper leg, Left upper leg, Right lower leg, Left lower leg, Back  Bathing assist Assist Level: Supervision or verbal cues (max  cues)      Upper Body Dressing/Undressing Upper body dressing   What is the patient wearing?: Pull over shirt/dress     Pull over shirt/dress - Perfomed by patient: Thread/unthread right sleeve, Thread/unthread left sleeve, Put head through opening, Pull shirt over trunk Pull over shirt/dress - Perfomed by helper: Thread/unthread right sleeve, Thread/unthread left sleeve, Put head through opening, Pull shirt over trunk        Upper body assist Assist Level: Supervision or verbal cues      Lower Body Dressing/Undressing Lower body dressing   What is the patient wearing?: Pants Underwear - Performed by patient: Pull underwear up/down   Pants- Performed by patient: Pull pants up/down Pants- Performed by helper: Pull pants up/down Non-skid slipper socks- Performed by patient: Don/doff right sock, Don/doff left sock Non-skid slipper socks- Performed by helper: Don/doff right sock, Don/doff left sock Socks - Performed by patient: Don/doff right sock, Don/doff left sock   Shoes - Performed by patient: Don/doff right shoe, Don/doff left shoe            Lower body assist Assist for lower body dressing: Supervision or verbal cues      Toileting Toileting   Toileting steps completed by patient: Adjust clothing prior to toileting, Performs perineal hygiene, Adjust clothing after toileting Toileting steps completed by helper: Performs perineal hygiene Toileting Assistive Devices: Grab bar or rail  Toileting assist Assist level: Supervision or verbal cues   Transfers Chair/bed transfer   Chair/bed  transfer method: Ambulatory Chair/bed transfer assist level: Supervision or verbal cues Chair/bed transfer assistive device: Armrests     Locomotion Ambulation     Max distance: 150 ft Assist level: Supervision or verbal cues    Wheelchair   Type: Manual Max wheelchair distance: 110' Assist Level: Supervision or verbal cues  Cognition Comprehension Comprehension assist level: Understands basic 75 - 89% of the time/ requires cueing 10 - 24% of the time  Expression Expression assist level: Expresses basic 75 - 89% of the time/requires cueing 10 - 24% of the time. Needs helper to occlude trach/needs to repeat words.  Social Interaction Social Interaction assist level: Interacts appropriately 75 - 89% of the time - Needs redirection for appropriate language or to initiate interaction.  Problem Solving Problem solving assist level: Solves basic 50 - 74% of the time/requires cueing 25 - 49% of the time  Memory Memory assist level: Recognizes or recalls 25 - 49% of the time/requires cueing 50 - 75% of the time   Medical Problem List and Plan: 1. Decreased functional mobility  secondary to TBI/large right occipital epidural hematoma, mildly displaced right temporal bone fracture,Posterior cervical spine ligamentous injury with cervical collar, multiple rib fractures after walking in front of a bus             -continue CIR             -RLAS >/ IV 2.  DVT Prophylaxis/Anticoagulation: SCDs. Dopplers negative 3. Pain Management: Hycet as needed 4. Mood/agitation related to traumatic brain injury:   -sleep/wake better in general  -continue risperdal at 2mg  qhs.  Consider increasing day time back to 1mg  if agitation continues  -celexa 40mg  qhs  -Klonopin: increase   to 1 mg  TID   - depakote  Decreased to 500mg   TID on 2/7 given supratherapeutic level--follow up level pending  -continue nicotine patch  -librium 10mg  QHS back   -haldol prn for severe agitation  -team working on consistent use of environmental cues/mods as possible for behavior/cognition   -unzipped enclosure bed when possible   -  5. Neuropsych: This patient is not capable of making decisions on her own behalf.             -poor judgment and  awareness 6. Skin/Wound Care: wound clean and continues closing  -Surgery signed off for now. abx completed  -continue wet to dry dressings daily--consider silver nitrate to wound 7. Fluids/Electrolytes/Nutrition:   -potassium 4.0 on 2/7 8. Seizure prophylaxis. Keppra 500 mg twice a day --decreased to 250mg  9. Dysphagia with decreased nutritional storage. Advanced to D3 thins now 10. 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   -decannulated   11. 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 Acute blood loss anemia  hgb 11.5 on 2/7 13. Polysubstance abuse. Provide counseling when appropriate   LOS (Days) [place] T, MD 11/19/2016 9:23 AM

## 2016-11-19 NOTE — Progress Notes (Signed)
Pt agitated and confused. Pt climbed out of lap tray with lap tray still in front of her while NT was attempting to allow her to eat her lunch. Pt refusing to eat lunch. Pt walking toward door with multiple attempts to redirect patient. Pt unredirectable despite attempts by multiple staff. Pt required 3 nurses and 2 nt to direct pt back to chair and pt brought to front desk.

## 2016-11-19 NOTE — Progress Notes (Signed)
Physical Therapy Session Note  Patient Details  Name: Jacqueline [name] MRN: [id number] Date of Birth: 1966-01-10  Today's Date: 11/19/2016 PT Individual Time: [id number] PT Individual Time Calculation (min): 43 min   Short Term Goals: Week 6:  PT Short Term Goal 1 (Week 6): Pt will be oriented to location 75% of the time. PT Short Term Goal 2 (Week 6): Pt will consistently demonstrate intellectual awawreness with mod cuing.  Skilled Therapeutic Interventions/Progress Updates:    Pt received in geri-chair at nurses station. Session focused on cognitive remediation. Pt assembled 3 pipe tree puzzles with min cuing for problem solving and error correction. 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 Pt continues to perseverate on going home but is more easily redirected on this date. At the end of the session pt left sitting in geri-chair with lap tray donned at a nurses station.  Pt without c/o or behaviors demonstrating pain.  Therapy Documentation Precautions:  Precautions Precautions: Cervical, Fall, Other (comment) Precaution Comments: abdominal incision Required Braces or Orthoses: Cervical Brace Cervical Brace: Hard collar, At all times Restrictions Weight Bearing Restrictions: No   See Function Navigator for Current Functional Status.   Therapy/Group: Individual Therapy  [name] 11/19/2016, 12:02 PM

## 2016-11-19 NOTE — Progress Notes (Signed)
Speech Language Pathology Daily Session Note  Patient Details  Name: Jacqueline [name] MRN: [id number] Date of Birth: [birth year]  Today's Date: 11/19/2016 SLP Individual Time: 1330-1400 SLP Individual Time Calculation (min): 30 min  Short Term Goals: Week 6: SLP Short Term Goal 1 (Week 6): Patient will demonstrate sustained attention to a task for 10 minutes with Max A multimodal cues.  SLP Short Term Goal 2 (Week 6): Patient will orient to place and situation with Mod A multimodal cues.  SLP Short Term Goal 3 (Week 6): Patient will demonstrate basic problem solving for self-care tasks with Mod A multimodal cues.  SLP Short Term Goal 4 (Week 6): Patient will identify 1 cognitive and 1 physical deficit with Mod A multimodal cues.  SLP Short Term Goal 5 (Week 6): Patient will consume trials of regular textures and demonstrate efficient mastication with complete oral clearance and without overt s/s of aspiration with Mod A verbal cues over 2 sessions prior to upgrade.   Skilled Therapeutic Interventions: Skilled treatment session focused on cognitive goals. SLP facilitated session by providing total A for orientation to time, place and situation and for recall of information after a 60 second delay. Patient also required total A for use of external aids to recall information. Patient with increased confabulation this session and reported she worked here but didn't like it so she turned in her resignation letter so she could go home. Unable to reorient despite Max A multimodal cues.  Patient left upright in [place] at [employer] station. Continue with current plan of care.      Function:   Cognition Comprehension Comprehension assist level: Understands basic 75 - 89% of the time/ requires cueing 10 - 24% of the time  Expression   Expression assist level: Expresses basic 75 - 89% of the time/requires cueing 10 - 24% of the time. Needs helper to occlude trach/needs to repeat words.  Social Interaction  Social Interaction assist level: Interacts appropriately 50 - 74% of the time - May be physically or verbally inappropriate.  Problem Solving Problem solving assist level: Solves basic 50 - 74% of the time/requires cueing 25 - 49% of the time  Memory Memory assist level: Recognizes or recalls 25 - 49% of the time/requires cueing 50 - 75% of the time    Pain No/Denies Pain   Therapy/Group: Individual Therapy  Ervan Heber 11/19/2016, 2:46 PM

## 2016-11-19 NOTE — Progress Notes (Signed)
Pt continues to be agitated and confused this afternoon. Pt attempting to leave unit multiple times. Pt in geri chair with multiple attempts at breaking out or climbing out of chair. When pt toileted it is increasingly difficult to direct pt back to chair Pt continues to push past staff to leave unit. PT requiring multiple staff members to return pt to room. Pt unable to redirect with puzzles or distraction.

## 2016-11-19 NOTE — Progress Notes (Signed)
Patient requested that she wanted to go to the bathroom RN with another staff took her to the bathroom and she tried to walk off the room saying she wants to go home. When redirecting her to bed she took RN badge and she refused to give it back. Patient continue to be very agitated and confused. Nothing we did to help her calm down could help.

## 2016-11-19 NOTE — Progress Notes (Signed)
Received in report that patient was very agitated and confused. Patient continue to be agitated and confused, she was constantly saying she was wants to go home. RN tried to redirect her first with activities but she couldn't participate. RN tried to toilet her but she also refused. RN offered her snacks but she refused.  RN tried to redirect her to bed bed and she refused. It took three staffs to put her back to bed.

## 2016-11-19 NOTE — Progress Notes (Signed)
Pt agitated and restless after dinner. RN attempted to direct patient back to bed but RN unable to redirect patient. Pt repeatedly perserverating on using phone to call her mom and asking people for a ride home. Pt refused to allow RN to change dressing on her abdomen and refusing the majority of her meals today. Will continue to monitor.

## 2016-11-19 NOTE — Progress Notes (Signed)
Occupational Therapy Session Note  Patient Details  Name: Jacqueline [name] MRN: [id number] Date of Birth: 07/30/1966  Today's Date: 11/19/2016 OT Individual Time: [id number] OT Individual Time Calculation (min): 55 min    Short Term Goals: Week 6:  OT Short Term Goal 1 (Week 6): Pt will demonstrate improved orientation to identify that she is in a hospital with mod verbal cues OT Short Term Goal 2 (Week 6): Pt will attend to bathing tasks for 5 minutes with min verbal cues  OT Short Term Goal 3 (Week 6): Pt will initiate use of Memory Book to assist with recall of daily events with max verbal cues  Skilled Therapeutic Interventions/Progress Updates:    Focus on self feeding, orientation, and engagement/attending to word search puzzles.  Pt recalled that she was at [facility] in [place] but perseverated of looking for her sister who lives outside of [place].  Pt would not engage in any functional activities but continued to perseverate.  Pt required total A for orientation to date and to attend to tasks (preparing coffee, attending to environment when ambulating). Pt returned to gerichair with tray at nursing station.   Therapy Documentation Precautions:  Precautions Precautions: Cervical, Fall, Other (comment) Precaution Comments: abdominal incision Required Braces or Orthoses: Cervical Brace Cervical Brace: Hard collar, At all times Restrictions Weight Bearing Restrictions: No Pain:  Pt denied pain See Function Navigator for Current Functional Status.   Therapy/Group: Individual Therapy  Leroy Libman 11/19/2016, 7:56 AM

## 2016-11-20 ENCOUNTER — Inpatient Hospital Stay (HOSPITAL_COMMUNITY): Payer: Self-pay | Admitting: Physical Therapy

## 2016-11-20 ENCOUNTER — Inpatient Hospital Stay (HOSPITAL_COMMUNITY): Payer: Self-pay

## 2016-11-20 ENCOUNTER — Inpatient Hospital Stay (HOSPITAL_COMMUNITY): Payer: Medicaid Other | Admitting: Speech Pathology

## 2016-11-20 MED ORDER — CHLORDIAZEPOXIDE HCL 5 MG PO CAPS
5.0000 mg | ORAL_CAPSULE | Freq: Every day | ORAL | Status: DC
  Administered 2016-11-20 – 2016-12-11 (×22): 5 mg via ORAL
  Filled 2016-11-20 (×22): qty 1

## 2016-11-20 NOTE — Progress Notes (Signed)
Physical Therapy Weekly Progress Note  Patient Details  Name: Jacqueline [name] MRN: [id number] Date of Birth: 1965-11-02  Beginning of progress report period: November 12, 2016 End of progress report period: November 19, 2016   Patient has met 0  of 2 short term goals.  Pt continues to require cues for orientation to situation and for awareness of deficits and safety awareness.  STG = LTG due to estimated LOS  Patient continues to demonstrate the following deficits decreased attention, decreased awareness, decreased problem solving, decreased safety awareness, decreased memory and delayed processing and therefore will continue to benefit from skilled PT intervention to increase functional independence with mobility.  Patient progressing toward long term goals..  Continue plan of care.  PT Short Term Goals Week 6:  PT Short Term Goal 1 (Week 6): Pt will be oriented to location 75% of the time. PT Short Term Goal 1 - Progress (Week 6): Progressing toward goal PT Short Term Goal 2 (Week 6): Pt will consistently demonstrate intellectual awawreness with mod cuing. PT Short Term Goal 2 - Progress (Week 6): Progressing toward goal  Skilled Therapeutic Interventions/Progress Updates:  Ambulation/gait training;Balance/vestibular training;Cognitive remediation/compensation;Community reintegration;Discharge planning;Disease management/prevention;DME/adaptive equipment instruction;Functional mobility training;Neuromuscular re-education;Pain management;Patient/family education;Psychosocial support;Stair training;Therapeutic Activities;Therapeutic Exercise;UE/LE Strength taining/ROM;UE/LE Coordination activities;Visual/perceptual remediation/compensation;Wheelchair propulsion/positioning     See Function Navigator for Current Functional Status.   Jaydi Bray 11/20/2016, 11:36 AM

## 2016-11-20 NOTE — Progress Notes (Signed)
Physical Therapy Note  Patient Details  Name: Jacqueline [name] MRN: [id number] Date of Birth: 21-Feb-1966 Today's Date: 11/20/2016    Time: 1300-1345 45 minutes  1:1 No c/o pain.  Session focused on orientation and awareness through functional tasks. Pt perseverating on finding "the boss" and "Jodie" throughout session, pt unable to be redirected by multiple staff members. Pt unable to be oriented to place or situation, arguing that "she works here" and that we are in [employer]".  Pt able to eat lunch with mod/max cuing for attention to task, cues to not place sugar on meat and salt in drink due to poor attention.  Pt with decreased attention, orientation and awareness this session.   DONAWERTH,KAREN 11/20/2016, 1:54 PM

## 2016-11-20 NOTE — Progress Notes (Signed)
Occupational Therapy Session Note  Patient Details  Name: Jacqueline [name] MRN: [id number] Date of Birth: 1966-07-19  Today's Date: 11/20/2016 OT Individual Time: [id number] OT Individual Time Calculation (min): 55 min    Short Term Goals: Week 6:  OT Short Term Goal 1 (Week 6): Pt will demonstrate improved orientation to identify that she is in a hospital with mod verbal cues OT Short Term Goal 2 (Week 6): Pt will attend to bathing tasks for 5 minutes with min verbal cues  OT Short Term Goal 3 (Week 6): Pt will initiate use of Memory Book to assist with recall of daily events with max verbal cues  Skilled Therapeutic Interventions/Progress Updates:    Pt resting in enclosure bed upon arrival and requested to get up.  Pt inconsistently oriented to hospital [facility], but required tot A for orientation to [place]). Pt declined eating breakfast X 3, stating she ate already.  Pt amb to make coffee and required max verbal cues to locate sugar and creamer on counter.  Pt consistently and persistently confuses this therapist with acquaintance from [place].  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  Pt requires max verbal cues to attend to tasks and to redirect conversation.  Pt returned to gerichair with tray in place and sitting at nursing station.   Therapy Documentation Precautions:  Precautions Precautions: Cervical, Fall, Other (comment) Precaution Comments: abdominal incision Required Braces or Orthoses: Cervical Brace Cervical Brace: Hard collar, At all times Restrictions Weight Bearing Restrictions: No   Pain: Pt denies pain  ADL: ADL ADL Comments: refer to functional navigator  See Function Navigator for Current Functional Status.   Therapy/Group: Individual Therapy  Leroy Libman 11/20/2016, 7:55 AM

## 2016-11-20 NOTE — Plan of Care (Signed)
Problem: RH SAFETY Goal: RH STG ADHERE TO SAFETY PRECAUTIONS W/ASSISTANCE/DEVICE STG Adhere to Safety Precautions With mod  Assistance/Device.  Outcome: Not Progressing Not responding to safe limits. Requiring max assistance

## 2016-11-20 NOTE — Progress Notes (Signed)
Speech Language Pathology Daily Session Note  Patient Details  Name: Jacqueline [name] MRN: [id number] Date of Birth: 03/07/1966  Today's Date: 11/20/2016 SLP Individual Time: 1100-1145 SLP Individual Time Calculation (min): 45 min  Short Term Goals: Week 6: SLP Short Term Goal 1 (Week 6): Patient will demonstrate sustained attention to a task for 10 minutes with Max A multimodal cues.  SLP Short Term Goal 2 (Week 6): Patient will orient to place and situation with Mod A multimodal cues.  SLP Short Term Goal 3 (Week 6): Patient will demonstrate basic problem solving for self-care tasks with Mod A multimodal cues.  SLP Short Term Goal 4 (Week 6): Patient will identify 1 cognitive and 1 physical deficit with Mod A multimodal cues.  SLP Short Term Goal 5 (Week 6): Patient will consume trials of regular textures and demonstrate efficient mastication with complete oral clearance and without overt s/s of aspiration with Mod A verbal cues over 2 sessions prior to upgrade.   Skilled Therapeutic Interventions: Skilled treatment session focused on cognitive goals. SLP facilitated session by providing supervision verbal cues for patient to demonstrate selective attention in a mildly distracting environment to a task of interest for ~20 minutes. Patient required total A for orientation and verbalized wanting to go home X 2 but was easily redirected. Patient left upright at RN station with all needs within reach. Continue with current plan of care.      Function:    Cognition Comprehension Comprehension assist level: Understands basic 75 - 89% of the time/ requires cueing 10 - 24% of the time  Expression   Expression assist level: Expresses basic 75 - 89% of the time/requires cueing 10 - 24% of the time. Needs helper to occlude trach/needs to repeat words.  Social Interaction Social Interaction assist level: Interacts appropriately 25 - 49% of time - Needs frequent redirection.  Problem Solving Problem  solving assist level: Solves basic 50 - 74% of the time/requires cueing 25 - 49% of the time  Memory Memory assist level: Recognizes or recalls 25 - 49% of the time/requires cueing 50 - 75% of the time    Pain Pain Assessment Pain Score: 0-No pain  Therapy/Group: Individual Therapy  Mayerly Kaman, [place] 11/20/2016, 3:17 PM
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
## 2016-11-20 NOTE — Progress Notes (Signed)
Pt agitated at lunch time. Pt was toileted and refused to go back to chair. Pt yelling and screaming and attempting to leave unit. RN verbalized the importance of not leaving the unit and using appropriate behavior in patient care areas. RN attempted to allow patient to sit at front desk in her geri chair and patient remains agitated with multiple episodes of screaming and yelling and multiple attempts to leave the unit. RN explained that this behavior was inapporpriate and if it continued she would have to return to her enclosure bed. RN was unable to redirect patient. Pt was directed to the vail bed with assistance of several staff members after she was toileted. RN explained that she would be back once the patient had a few minutes to rest. Will continue to monitor.

## 2016-11-21 ENCOUNTER — Inpatient Hospital Stay (HOSPITAL_COMMUNITY): Payer: Self-pay

## 2016-11-21 ENCOUNTER — Inpatient Hospital Stay (HOSPITAL_COMMUNITY): Payer: Medicaid Other

## 2016-11-21 ENCOUNTER — Inpatient Hospital Stay (HOSPITAL_COMMUNITY): Payer: Medicaid Other | Admitting: Speech Pathology
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
## 2016-11-21 NOTE — Progress Notes (Signed)
Occupational Therapy Session Note  Patient Details  Name: Jacqueline [name] MRN: [id number] Date of Birth: [birth year]  Today's Date: 11/21/2016 OT Individual Time: [id number] OT Individual Time Calculation (min): 56 min    Short Term Goals: Week 6:  OT Short Term Goal 1 (Week 6): Pt will demonstrate improved orientation to identify that she is in a hospital with mod verbal cues OT Short Term Goal 2 (Week 6): Pt will attend to bathing tasks for 5 minutes with min verbal cues  OT Short Term Goal 3 (Week 6): Pt will initiate use of Memory Book to assist with recall of daily events with max verbal cues  Skilled Therapeutic Interventions/Progress Updates:    Pt resting in enclosure bed upon arrival.  Pt requested to "get out of this cage."  Pt declined multiple offers to eat breakfast but did request coffee.  Pt amb to family room to prepare coffee.  Pt consistently requires max verbal cues for use of Keurig and to locate condiments beside coffee machine.  Pt perseverated on locating her sister because "she works and lives in hospital." Pt oriented to [facility] but persistently states she is at [employer]  Pt states she is in [place] and became agitated when corrected.  Pt returned to gerichair in room and offered her puzzle books to work.  Pt remained in gerichair at nursing station.  Focus on orienation, task initiation, attention to task, and safety awareness.  Therapy Documentation Precautions:  Precautions Precautions: Cervical, Fall, Other (comment) Precaution Comments: abdominal incision Required Braces or Orthoses: Cervical Brace Cervical Brace: Hard collar, At all times Restrictions Weight Bearing Restrictions: No Pain: Pain Assessment Pain Assessment: Faces Faces Pain Scale: Hurts even more Pain Type: Chronic pain Pain Location: Back Pain Descriptors / Indicators: Aching Pain Onset: Gradual Pain Intervention(s): Medication (See eMAR)  See Function Navigator for Current  Functional Status.   Therapy/Group: Individual Therapy  Leroy Libman 11/21/2016, 8:01 AM

## 2016-11-21 NOTE — Progress Notes (Signed)
Speech Language Pathology Weekly Progress and Session Note  Patient Details  Name: ROSALEE TOLLEY MRN: [id number] Date of Birth: 02-21-1966  Beginning of progress report period: November 14, 2016 End of progress report period: November 21, 2016  Today's Date: 11/21/2016 SLP Individual Time: 1330-1400 SLP Individual Time Calculation (min): 30 min  Short Term Goals: Week 6: SLP Short Term Goal 1 (Week 6): Patient will demonstrate sustained attention to a task for 10 minutes with Max A multimodal cues.  SLP Short Term Goal 1 - Progress (Week 6): Met SLP Short Term Goal 2 (Week 6): Patient will orient to place and situation with Mod A multimodal cues.  SLP Short Term Goal 2 - Progress (Week 6): Not met SLP Short Term Goal 3 (Week 6): Patient will demonstrate basic problem solving for self-care tasks with Mod A multimodal cues.  SLP Short Term Goal 3 - Progress (Week 6): Met SLP Short Term Goal 4 (Week 6): Patient will identify 1 cognitive and 1 physical deficit with Mod A multimodal cues.  SLP Short Term Goal 4 - Progress (Week 6): Not met SLP Short Term Goal 5 (Week 6): Patient will consume trials of regular textures and demonstrate efficient mastication with complete oral clearance and without overt s/s of aspiration with Mod A verbal cues over 2 sessions prior to upgrade.  SLP Short Term Goal 5 - Progress (Week 6): Not met    New Short Term Goals: Week 7: SLP Short Term Goal 1 (Week 7): Patient will demonstrate sustained attention to a task for 15 minutes with Max A multimodal cues.  SLP Short Term Goal 2 (Week 7): Patient will orient to place and situation with Mod A multimodal cues.  SLP Short Term Goal 3 (Week 7): Patient will demonstrate basic problem solving for self-care tasks with Min A multimodal cues.  SLP Short Term Goal 4 (Week 7): Patient will identify 1 cognitive and 1 physical deficit with Mod A multimodal cues.  SLP Short Term Goal 5 (Week 7): Patient will consume trials  of regular textures and demonstrate efficient mastication with complete oral clearance and without overt s/s of aspiration with Mod A verbal cues over 2 sessions prior to upgrade.   Weekly Progress Updates:Patient has made minimal  functional gains and has met 2 of 5STG's this reporting period. Currently, patient is demonstrating behaviors consistent with a [place] requires overall Mod-Max Ato complete functional and familiar tasks safely in regards functional problem solving and sustained attention and Max-Total A for recall of new information and intellectual awareness of deficits. Patient continues to demonstrate verbosity with language of confusion, confabulation, and perseveration. A memory notebook has been beneficial with maximizing orientation but patient needs continued reinforcement for utilziation. Patient is currently consuming Dys. 3 textures with thin liquids via cup and straw with minimal overt s/s of aspiration and requires Min-ModA verbal cues for use of swallowing compensatory strategies due to decreased attention to bolus at times. Patient and family education is ongoing. Patient would benefit from continued skilled SLP intervention to maximize her cognitive and swallowing function and overall functional independence prior to discharge.      Intensity: Minumum of 1-2 x/day, 30 to 90 minutes Frequency: 3 to 5 out of 7 days Duration/Length of Stay: TBD due to SNF placement  Treatment/Interventions: Cognitive remediation/compensation;Cueing hierarchy;Functional tasks;Patient/family education;Therapeutic Activities;Internal/external aids;Dysphagia/aspiration precaution training;Environmental controls   Daily Session  Skilled Therapeutic Interventions: Skilled treatment session focused on cognitive goals. SLP facilitated session by providing supervision verbal cues  for patient to demonstrate selective attention in a mildly distracting environment to a task of interest for  ~15 minutes. Patient required total A for orientation and verbalized wanting to go home X 1 but was easily redirected. Overall, patient demonstrated decreased verbosity and confabulation this session. Of note, patient consumed trials of regular textures (caramel candy) with efficient mastication and without overt s/s of aspiration. Recommend patient continue current diet with trials of regular textures X 1 more session prior to upgrade. Patient left upright at RN station with all needs within reach. Continue with current plan of care.       Function:   Cognition Comprehension Comprehension assist level: Understands basic 75 - 89% of the time/ requires cueing 10 - 24% of the time  Expression   Expression assist level: Expresses basic 75 - 89% of the time/requires cueing 10 - 24% of the time. Needs helper to occlude trach/needs to repeat words.  Social Interaction Social Interaction assist level: Interacts appropriately 25 - 49% of time - Needs frequent redirection.  Problem Solving Problem solving assist level: Solves basic 50 - 74% of the time/requires cueing 25 - 49% of the time  Memory Memory assist level: Recognizes or recalls 25 - 49% of the time/requires cueing 50 - 75% of the time   Pain No/Denies Pain   Therapy/Group: Individual Therapy  ,  11/21/2016, 3:18 PM

## 2016-11-21 NOTE — Progress Notes (Signed)
Physical Therapy Note  Patient Details  Name: Jacqueline [name] MRN: [id number] Date of Birth: July 23, 1966 Today's Date: 11/21/2016  0900-1000, 60 min individual tx Pain:no c/o  Pt seated in [place] chair with RN, playing Leonard.  Pt continued to attend to the game x 15 min withtout redirection. Pt donned / doffed gloves and cleaned table top with wipes with supervision, 1 cue.  NuStep x 10 minutes at level 4 for general activity tolerance using bil UEs and LEs  Pt verbose regarding childhood friends; disorientated to place and situation, oriented to date and self. Gait throughout unit on level tile, unlevel mulched surface, and ramp.  Pt left resting in Geri chair at nurse's station for supervision, safety.   See function navigator for current status.  Dyana Magner 11/21/2016, 7:53 AM

## 2016-11-22 ENCOUNTER — Inpatient Hospital Stay (HOSPITAL_COMMUNITY): Payer: Self-pay | Admitting: Speech Pathology

## 2016-11-22 ENCOUNTER — Inpatient Hospital Stay (HOSPITAL_COMMUNITY): Payer: Self-pay | Admitting: Physical Therapy

## 2016-11-22 ENCOUNTER — Inpatient Hospital Stay (HOSPITAL_COMMUNITY): Payer: Medicaid Other | Admitting: Occupational Therapy

## 2016-11-22 ENCOUNTER — Inpatient Hospital Stay (HOSPITAL_COMMUNITY): Payer: Self-pay

## 2016-11-22 NOTE — Progress Notes (Signed)
Night began for pt with my coming into her room and pt stating she needed to "pee".  On walking to bed, noted ample amount of urine-scented liquid on floor.  Pt stated she had taken her pants off to pee.  Bed smelled of urine.  Assisted pt out of [place] bed, and to bathroom.  Pt unable to pee further, most likely had already voided, although I am unsure how it all got on floor.    Offered to assist pt to change and get clean.  Pt at first refused, said she was going home and had to hurry. Repeated this multiple times, as well as saying "I quit this job". Still cannot tell where she is, how old she is, why she is here, but wants to go home, or sometimes states she has to be at her job. But at length, able to get pt to change her pants.  Pt refused to allow me to change her dressing. Or assist in oral care.  Pt continued to walk around room, pulling things out of drawers and off tables, stating she was going home. I asked her is she would take a walk with me.  She did, all around the unit.  Used distraction and redirection effectively for a brief time, then pt right back on the subject of leaving AMA.  NOW.  Got pt in geri chair and moved her to nurses' station, gave pt her oral meds, which she took with her nutritional drink.  Pt took tennis shoes off and stated she would jump out of [place] chair.  Pt attempting to unlock chair and get to nurses' papers and materials at desk area.    Eventually walked pt to room, offered to take her to bathroom. Pt refused.  Pt attempted to put shoes back on and insisted she was leaving.  Pt drew hand back several times as though to strike me, but did not.  Had CNA, Angie, and CN Di Kindle all in room for psychological show of force to get pt to rest for awhile in the bed.  Pt resting now.  Pt has been in the bed since 2130, and is currently sleeping, breathing unlabored.  Jacqueline [name]

## 2016-11-22 NOTE — Progress Notes (Signed)
Speech Language Pathology Daily Session Note  Patient Details  Name: Jacqueline [name] MRN: [id number] Date of Birth: 12-Jan-1966  Today's Date: 11/22/2016 SLP Individual Time: 1430-1500 SLP Individual Time Calculation (min): 30 min  Short Term Goals: Week 7: SLP Short Term Goal 1 (Week 7): Patient will demonstrate sustained attention to a task for 15 minutes with Max A multimodal cues.  SLP Short Term Goal 2 (Week 7): Patient will orient to place and situation with Mod A multimodal cues.  SLP Short Term Goal 3 (Week 7): Patient will demonstrate basic problem solving for self-care tasks with Min A multimodal cues.  SLP Short Term Goal 4 (Week 7): Patient will identify 1 cognitive and 1 physical deficit with Mod A multimodal cues.  SLP Short Term Goal 5 (Week 7): Patient will consume trials of regular textures and demonstrate efficient mastication with complete oral clearance and without overt s/s of aspiration with Mod A verbal cues over 2 sessions prior to upgrade.   Skilled Therapeutic Interventions: Skilled treatment session focused on dysphagia and cognitive goals. SLP facilitated session by providing trials of regular textures. Patient demonstrated intermittent overt s/s of aspiration, therefore, recommend continued trials with SLP only. Patient also participated in a basic cognitive task for ~20 minutes with supervision verbal cues in a moderately distracting environment and required Min A verbal cues to follow procedures of task. Patient left at RN station in [facility]. Continue with current plan of care.      Function:  Cognition Comprehension Comprehension assist level: Understands basic 50 - 74% of the time/ requires cueing 25 - 49% of the time  Expression   Expression assist level: Expresses basic 75 - 89% of the time/requires cueing 10 - 24% of the time. Needs helper to occlude trach/needs to repeat words.  Social Interaction Social Interaction assist level: Interacts  appropriately 25 - 49% of time - Needs frequent redirection.  Problem Solving Problem solving assist level: Solves basic 50 - 74% of the time/requires cueing 25 - 49% of the time  Memory Memory assist level: Recognizes or recalls 25 - 49% of the time/requires cueing 50 - 75% of the time    Pain No/Denies Pain   Therapy/Group: Individual Therapy  Mickayla Trouten 11/22/2016, 3:33 PM

## 2016-11-22 NOTE — Progress Notes (Signed)
Occupational Therapy Note  Patient Details  Name: MAJEL MELO MRN: [id number] Date of Birth: 1966/04/08  Today's Date: 11/22/2016 OT Missed Time: 32 Minutes Missed Time Reason: Patient fatigue;Other (comment) (pt sleeping; unable to arouse to participate)  Pt missed 60 mins skilled OT services.  Pt asleep upon arrival. Unable to adequately arouse pt to actively participate in therapy session.    [name] [facility] 11/22/2016, 7:49 AM

## 2016-11-22 NOTE — Progress Notes (Signed)
Physical Therapy Note  Patient Details  Name: Jacqueline [name] MRN: [id number] Date of Birth: [birth year] Today's Date: 11/22/2016    Time: 828-922 54 minutes  1:1 Pt c/o toothache, medicated prior to session.  Pt drowsy this session, able to make 2 cups of coffee with mod cuing.  Pt performed nustep for attention x 10 minutes without cues.  Pt continues to be disoriented to place and situation, argues with PT with attempts to reorient.  Pt requires more cues to stay focused on task and was unable to reoriented at all during session.  PT attempts to encourage pt to bathe and change clothes but pt refuses, perseverating on "calling my work since I won't be there today"   Margy Sumler 11/22/2016, 9:24 AM
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
## 2016-11-22 NOTE — Progress Notes (Signed)
Occupational Therapy Session Note  Patient Details  Name: Jacqueline [name] MRN: [id number] Date of Birth: 12/18/1965  Today's Date: 11/22/2016 OT Individual Time: 1100-1130 OT Individual Time Calculation (min): 30 min    Short Term Goals: Week 6:  OT Short Term Goal 1 (Week 6): Pt will demonstrate improved orientation to identify that she is in a hospital with mod verbal cues OT Short Term Goal 2 (Week 6): Pt will attend to bathing tasks for 5 minutes with min verbal cues  OT Short Term Goal 3 (Week 6): Pt will initiate use of Memory Book to assist with recall of daily events with max verbal cues  Skilled Therapeutic Interventions/Progress Updates:    Treatment session with focus on orientation and attention to task. Pt declined bathing or dressing this session despite max encouragement.  Engaged in orientation task with use of schedule and invitation/handout to [place] party.  Pt utilizing handout to increase orientation with use of date on paper.  Attempted to engage in simple matching activity with pt demonstrating ability to complete, however requiring increased time and cues for awareness and problem solving.  Therapy Documentation Precautions:  Precautions Precautions: Cervical, Fall, Other (comment) Precaution Comments: abdominal incision Required Braces or Orthoses: Cervical Brace Cervical Brace: Hard collar, At all times Restrictions Weight Bearing Restrictions: No Pain: Pain Assessment Pain Assessment: 0-10 Pain Score: 8  Pain Type: Acute pain Pain Location: Teeth (Upper left back molar.) Pain Orientation: Upper;Left;Distal Pain Descriptors / Indicators: Jabbing;Sharp;Stabbing Pain Frequency: Intermittent Pain Onset: Awakened from sleep (Her fist complaint about tooth pain was this morning. ) Patients Stated Pain Goal: 0 Pain Intervention(s): Medication (See eMAR);RN made aware  See Function Navigator for Current Functional Status.   Therapy/Group:  Individual Therapy  Simonne Come 11/22/2016, 3:30 PM

## 2016-11-22 NOTE — Progress Notes (Signed)
Pt started off shift wanting to go to the bathroom.  I escorted pt to bathroom, pt voided and flushed.  Assisted pt with perineal cleaning and hand hygiene.  Pt independently uses the hand sanitizer after voiding.    Pt then began talking about wanting to go home NOW.  Used distraction, diversion, and reorienting conversation to help pt change her focus away from leaving.  Got pt willingly in her geri-chair where she sat at the nurses' station, away from the nursing desks and material.  Pt will frequently attempt to unlock chair and go up to desk area and rifle through papers.  I and the staff monitored to prevent this activity.  Pt took her meds willingly and drank her nutritional drink.  She did fair in terms of staying in nursing station during med pass.  After this, took pt into large therapy area where there was a short party for a staff member leaving.  Pt was included and joined in conversation.  Pt cannot remember where she is, how old she is, how old her children are.    Pt was given a Haldol tonight due to pt's perseverating on going home and leaving AMA.  She relaxed and by midnight willingly got into her bed, after changing the sheets herself and arranging her own covers.  She slept until 5:30 when her next med was due.  Rayann Heman

## 2016-11-22 NOTE — Progress Notes (Signed)
Pt. Has been attempting to leave the floor,after several strategies  to re-orient,re direct and explained to her the reasons why she can not leave at this time,she perseverated and become very agitated and agressive.Haldol 2 mg. PO was given.Keep monitoring pt. Closely.

## 2016-11-23 ENCOUNTER — Inpatient Hospital Stay (HOSPITAL_COMMUNITY): Payer: Self-pay | Admitting: Speech Pathology

## 2016-11-23 ENCOUNTER — Inpatient Hospital Stay (HOSPITAL_COMMUNITY): Payer: Self-pay

## 2016-11-23 ENCOUNTER — Inpatient Hospital Stay (HOSPITAL_COMMUNITY): Payer: Self-pay | Admitting: Physical Therapy

## 2016-11-23 NOTE — Progress Notes (Signed)
Occupational Therapy Session Note  Patient Details  Name: Jacqueline [name] MRN: [id number] Date of Birth: [birth year]  Today's Date: 11/23/2016 OT Individual Time: 0700-0800 OT Individual Time Calculation (min): 60 min    Short Term Goals: Week 7:  OT Short Term Goal 1 (Week 7): Pt will demonstrate improved orientation to identify that she is in a hospital with mod verbal cues OT Short Term Goal 2 (Week 7): Pt will attend to bathing tasks for 5 minutes with min verbal cues   Skilled Therapeutic Interventions/Progress Updates:    Pt resting in gerichair upon arrival.  Pt requested to use toilet and returned to room.  Pt amb into bathroom and returned to room. Pt declined breakfast but did want another cup of coffee.  Pt required max verbal cues to locate coffee K-cups and to locate family room.  Pt continues to require max verbal cues to locate condiments and operate coffee maker [name]). Pt again requested use of bathroom and amb to public restroom. Pt transitioned to table task (word search puzzles). Pt engaged in turn taking with therapist and assisted therapist with locating words in puzzle.  Pt returned to nursing station with tray in place.  Pt stated she wanted to quit X 4 but was easily redirected this morning.   Therapy Documentation Precautions:  Precautions Precautions: Cervical, Fall, Other (comment) Precaution Comments: abdominal incision Required Braces or Orthoses: Cervical Brace Cervical Brace: Hard collar, At all times Restrictions Weight Bearing Restrictions: No General:   Vital Signs:  Pain:   ADL: ADL ADL Comments: refer to functional navigator Exercises:   Other Treatments:    See Function Navigator for Current Functional Status.   Therapy/Group: Individual Therapy  Leroy Libman 11/23/2016, 9:00 AM
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
## 2016-11-23 NOTE — Progress Notes (Signed)
Physical Therapy Note  Patient Details  Name: Jacqueline [name] MRN: [id number] Date of Birth: October 23, 1965 Today's Date: 11/23/2016    Time: 1000-1055 55 minutes  1:1 No c/o pain.  Session focused on attention and orientation during functional tasks. Pt assisted with cleaning day room and therapy gym with supervision cues to stay on task, pt mod I for mobility and gait.  Pt continues to require max cuing for orientation throughout session, continues to believe she is at work and in [place].  Pt able to attend to tasks during this session for >5 minutes without cues.   [name] 11/23/2016, 10:57 AM

## 2016-11-23 NOTE — Progress Notes (Signed)
Social Work Patient ID: Jacqueline [name], female   DOB: 02-27-1966, 51 y.o.   MRN: [id number]   Mother aware that I am continuing to work on ALF/ locked placement for pt and searching throughout the [place].  Have contacted facilities recommended by community resources and the AD of [facility], however, have only received "declines".  Behavior continues to be an issue and pt must be in a facility that has locked exits.  Will keep team and family posted.  Jacqueline Pineau, LCSW

## 2016-11-23 NOTE — Progress Notes (Signed)
Occupational Therapy Weekly Progress Note  Patient Details  Name: Jacqueline [name] MRN: [id number] Date of Birth: 04-Feb-1966  Beginning of progress report period: November 16, 2016 End of progress report period: November 23, 2016  Patient has met 0 of 3 short term goals.  Pt continues to require max verbal cues for engagement in functional tasks.  Pt continues to require tot a for orientation and frequently states she is at [facility] in [place], [place].Marland Kitchen  Pt consistently states she wants to "quit" and return home but no one will let her quit.  Pt continues to refuse self care tasks outside of using the toilet and washing her hands.  Continues to demonstrate Rancho Level 5 behaviors.   Patient continues to demonstrate the following deficits: decreased attention, decreased awareness, decreased problem solving, decreased safety awareness, decreased memory and delayed processing and decreased balance strategies and difficulty maintaining precautions and therefore will continue to benefit from skilled OT intervention to enhance overall performance with BADL, iADL and Reduce care partner burden.  Patient progressing toward long term goals..  Continue plan of care.  OT Short Term Goals Week 6:  OT Short Term Goal 1 (Week 6): Pt will demonstrate improved orientation to identify that she is in a hospital with mod verbal cues OT Short Term Goal 1 - Progress (Week 6): Progressing toward goal OT Short Term Goal 2 (Week 6): Pt will attend to bathing tasks for 5 minutes with min verbal cues  OT Short Term Goal 2 - Progress (Week 6): Progressing toward goal OT Short Term Goal 3 (Week 6): Pt will initiate use of Memory Book to assist with recall of daily events with max verbal cues OT Short Term Goal 3 - Progress (Week 6): Discontinued (comment) Week 7:  OT Short Term Goal 1 (Week 7): Pt will demonstrate improved orientation to identify that she is in a hospital with mod verbal cues OT Short Term  Goal 2 (Week 7): Pt will attend to bathing tasks for 5 minutes with min verbal cues       Therapy Documentation Precautions:  Precautions Precautions: Cervical, Fall, Other (comment) Precaution Comments: abdominal incision Required Braces or Orthoses: Cervical Brace Cervical Brace: Hard collar, At all times Restrictions Weight Bearing Restrictions: No  See Function Navigator for Current Functional Status.     Jacqueline [name] [facility] 11/23/2016, 6:57 AM

## 2016-11-23 NOTE — Progress Notes (Signed)
SLP Cancellation Note  Patient Details Name: Jacqueline [name] MRN: [id number] DOB: November 02, 1965   Cancelled treatment:       Patient missed 45 minutes of skilled SLP intervention due to increased agitation. Upon arrival, patient had just been placed in enclosure bed due to increased agitation and was yelling out while kicking the wall. Will re-attempt as schedule allows.                                                                                                 Erikson Danzy 11/23/2016, 3:04 PM

## 2016-11-23 NOTE — Plan of Care (Signed)
Problem: RH KNOWLEDGE DEFICIT BRAIN INJURY Goal: RH STG INCREASE KNOWLEDGE OF SELF CARE AFTER BRAIN INJURY Family able to verbalize self care of brain injury with min assistance  Outcome: Progressing Pt is very slowly making progress in knowledge of self care in that she has become more continent and aware of toileting needs to avoid incontinent episodes.  Pt needs much reinforcement of the self care and compliance with care needed after this brain injury.

## 2016-11-23 NOTE — Progress Notes (Signed)
MD. And PAC have been notified about the condition of the abdominal wound.As per RN.it looks friable,moderated serous sanguineus drainage noticed at dressing change.Keep monitoring pt. Closely and assessing her needs.

## 2016-11-23 NOTE — Plan of Care (Signed)
Problem: RH BLADDER ELIMINATION Goal: RH STG MANAGE BLADDER WITH EQUIPMENT WITH ASSISTANCE STG Manage Bladder With Equipment With  Mod Assistance  Outcome: Progressing Pt will continue to be able to communicate need to use bathroom, have little to no incontinent episodes.

## 2016-11-24 ENCOUNTER — Inpatient Hospital Stay (HOSPITAL_COMMUNITY): Payer: Medicaid Other | Admitting: *Deleted

## 2016-11-24 NOTE — Progress Notes (Signed)
Physical Therapy Session Note  Patient Details  Name: SIMRAH CHATHAM MRN: [id number] Date of Birth: 1966/09/06  Today's Date: 11/24/2016 PT Individual Time: 1405-1450 PT Individual Time Calculation (min): 45 min   Short Term Goals: Week 4:  PT Short Term Goal 1 (Week 4): Pt will be oriented to location with min cuing. PT Short Term Goal 1 - Progress (Week 4): Met PT Short Term Goal 2 (Week 4): Pt will negotiate 12 steps with supervision for strengthening with PT. PT Short Term Goal 2 - Progress (Week 4): Met PT Short Term Goal 3 (Week 4): Pt will demonstrate intellectual awareness with moderate cuing. PT Short Term Goal 3 - Progress (Week 4): Progressing toward goal  Skilled Therapeutic Interventions/Progress Updates: Tx focused on attention and orientation during functional tasks in room and on unit. Pt up in veil bed, ready to go. Pt performed functional cleaning tasks, including reaching surfaces at various levels with S and no LOB. Mod I for controlled environment gait and mobility. Pt reoriented with Max A throughout tx and redirected easily when talking about going home. Pt performed puzzle task x10 min with >56mn sustained attention to task, but max cues for problem solving and sequencing. Pt left up in chair in room with puzzle and NT agreeable to keep her in sight.  .     Therapy Documentation Precautions:  Precautions Precautions: Cervical, Fall, Other (comment) Precaution Comments: abdominal incision Required Braces or Orthoses: Cervical Brace Cervical Brace: Hard collar, At all times Restrictions Weight Bearing Restrictions: No    Vital Signs: Therapy Vitals Temp: 97.8 F (36.6 C) Temp Source: Oral Pulse Rate: 72 Resp: 18 BP: (!) 91/54 Patient Position (if appropriate): Sitting Oxygen Therapy SpO2: 100 % O2 Device: Not Delivered Pain: none    See Function Navigator for Current Functional Status.   Therapy/Group: Individual Therapy  CKennieth Rad PT,  DPT   KDwaine Deter CBonner Springs2/17/2018, 3:28 PM
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
## 2016-11-25 ENCOUNTER — Inpatient Hospital Stay (HOSPITAL_COMMUNITY): Payer: Medicaid Other | Admitting: Physical Therapy
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
## 2016-11-25 NOTE — Plan of Care (Signed)
Problem: RH SAFETY Goal: RH STG ADHERE TO SAFETY PRECAUTIONS W/ASSISTANCE/DEVICE STG Adhere to Safety Precautions With mod  Assistance/Device.  Outcome: Not Progressing Continues to require frequent redirection and cueing
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
## 2016-11-26 ENCOUNTER — Inpatient Hospital Stay (HOSPITAL_COMMUNITY): Payer: Medicaid Other | Admitting: Speech Pathology

## 2016-11-26 ENCOUNTER — Inpatient Hospital Stay (HOSPITAL_COMMUNITY): Payer: Medicaid Other | Admitting: Physical Therapy

## 2016-11-26 ENCOUNTER — Inpatient Hospital Stay (HOSPITAL_COMMUNITY): Payer: Self-pay

## 2016-11-26 NOTE — Progress Notes (Signed)
Speech Language Pathology Daily Session Note  Patient Details  Name: Jacqueline [name] MRN: [id number] Date of Birth: 1966-08-14  Today's Date: 11/26/2016 SLP Individual Time: 1300-1345 SLP Individual Time Calculation (min): 45 min  Short Term Goals: Week 7: SLP Short Term Goal 1 (Week 7): Patient will demonstrate sustained attention to a task for 15 minutes with Max A multimodal cues.  SLP Short Term Goal 2 (Week 7): Patient will orient to place and situation with Mod A multimodal cues.  SLP Short Term Goal 3 (Week 7): Patient will demonstrate basic problem solving for self-care tasks with Min A multimodal cues.  SLP Short Term Goal 4 (Week 7): Patient will identify 1 cognitive and 1 physical deficit with Mod A multimodal cues.  SLP Short Term Goal 5 (Week 7): Patient will consume trials of regular textures and demonstrate efficient mastication with complete oral clearance and without overt s/s of aspiration with Mod A verbal cues over 2 sessions prior to upgrade.   Skilled Therapeutic Interventions: Skilled treatment session focused on addressing dysphagia and cognition goals. Patient received from RN after recent escalation. SLP facilitated session with Mod verbal and visual cues for assisting with problem solving of tray set-up.  Patient consumed Dys.3 textures and thin liquids with a prolonged oral phase for mastication and clearance; however, with no overt s/s of aspiration.  Patient able to sustain attention to self-feeding for ~5 minute increments in between getting up and walking around dayroom.  Patient required Max assist multi-modal cues for re-orientation to place and situation.  Patient also required Max assist +2 multimodal cues and re-direction to chair at end of session, with RN assist.  Continue with current plan of care.    Function:  Eating Eating   Modified Consistency Diet: Yes Eating Assist Level: Set up assist for;Supervision or verbal cues   Eating Set Up Assist  For: Opening containers       Cognition Comprehension Comprehension assist level: Understands basic 50 - 74% of the time/ requires cueing 25 - 49% of the time  Expression   Expression assist level: Expresses basic 75 - 89% of the time/requires cueing 10 - 24% of the time. Needs helper to occlude trach/needs to repeat words.  Social Interaction Social Interaction assist level: Interacts appropriately 25 - 49% of time - Needs frequent redirection.  Problem Solving Problem solving assist level: Solves basic 50 - 74% of the time/requires cueing 25 - 49% of the time  Memory Memory assist level: Recognizes or recalls 25 - 49% of the time/requires cueing 50 - 75% of the time    Pain Pain Assessment Pain Assessment: No/denies pain  Therapy/Group: Individual Therapy  Carmelia Roller., CCC-SLP [id number]  [place] 11/26/2016, 2:24 PM

## 2016-11-26 NOTE — Progress Notes (Signed)
Occupational Therapy Session Note  Patient Details  Name: Jacqueline [name] MRN: [id number] Date of Birth: 09-Nov-1965  Today's Date: 11/26/2016 OT Individual Time: 0700-0800 OT Individual Time Calculation (min): 60 min    Short Term Goals: Week 7:  OT Short Term Goal 1 (Week 7): Pt will demonstrate improved orientation to identify that she is in a hospital with mod verbal cues OT Short Term Goal 2 (Week 7): Pt will attend to bathing tasks for 5 minutes with min verbal cues   Skilled Therapeutic Interventions/Progress Updates:    Pt standing at nursing station with RN present upon arrival.  Pt redirected to family room because pt stated she wanted to get warm.  Pt stated she needed a new cup of coffee.  Pt continues to require max verbal cues for operation of [employer] coffee maker and locating condiments for coffee.  Pt consistently asked where her sister and daughter were.  Pt easily directed.  Pt assisted therapist with using saniwipes to wipe down surfaces in conference room.  Pt amb back to room to gather word puzzle books and supplies.  Pt overheard MD talking to another pt about going home.  Pt immediately stated she needed to go home and walked toward hallway and elevator.  Elevator door opened as soon as pt walked into hallway and pt walked on elevator but was easily redirected by stating that the elevator was not operating.  Pt redirected back to room.  RN entered room and was successful in redirecting pt to paper and pencil task in gerichair.  Pt required tot A for orientation throughout session.   Therapy Documentation Precautions:  Precautions Precautions: Cervical, Fall, Other (comment) Precaution Comments: abdominal incision Required Braces or Orthoses: Cervical Brace Cervical Brace: Hard collar, At all times Restrictions Weight Bearing Restrictions: No Pain: Pt denied pain  See Function Navigator for Current Functional Status.   Therapy/Group: Individual Therapy  Leroy Libman 11/26/2016, 9:55 AM

## 2016-11-26 NOTE — Progress Notes (Signed)
Patient getting increasingly restless insisting on leaving to "go to my other job."  Attempted redirecting patient by making coffee and writing in journal unsuccessfully.  Patient was attempting to climb out of geri chair and therefore was placed back into the enclosure bed for patient safety.  Patient stated "I quit, I am leaving, I quit" thinking she is working.  Attempted to reorient unsuccessfully.  Left patient in room in enclosure bed.  Patient scheduled for therapy at 1000.

## 2016-11-26 NOTE — Progress Notes (Signed)
Physical Therapy Session Note  Patient Details  Name: Jacqueline [name] MRN: [id number] Date of Birth: 1966/06/25  Today's Date: 11/26/2016 PT Individual Time: 1006-1101 PT Individual Time Calculation (min): 55 min   Skilled Therapeutic Interventions/Progress Updates:  Pt received in enclosure bed without brief or pants on reporting she needed to use the bathroom. Reinforced need for pt to keep her pants on and use the toilet in bathroom. Pt ambulated throughout room<>bathroom with distant supervision to have continent void on toilet and perform hand hygiene at sink. Session focused on cognitive remediation with pt assembling 3 simple and 1 fairly complex pipe tree shapes with min cuing for problem solving and improved self correction with errors. 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 Pt engaged in task requiring her to identify object that didn't belong out of choice of four with 85% accuracy. At end of session pt left sitting in geri-chair with lap tray donned at nurses station.     Therapy Documentation Precautions:  Precautions Precautions: Cervical, Fall, Other (comment) Precaution Comments: abdominal incision Required Braces or Orthoses: Cervical Brace Cervical Brace: Hard collar, At all times Restrictions Weight Bearing Restrictions: No  Pain: Pt without c/o or behaviors demonstrating pain.  See Function Navigator for Current Functional Status.   Therapy/Group: Individual Therapy  [name] 11/26/2016, 12:18 PM

## 2016-11-26 NOTE — Progress Notes (Signed)
Social Work  Patient ID: Phillis Haggis, female   DOB: [birth year], 51 y.o.   MRN: [id number]   Alerted by [name], Rehab Director, this morning that he has reached out to [facility] [place] who instructs this team to pursue d/c home with family as a care facility cannot be found.  This SW has been searching for ALF/ RH/ [facility] placement and, despite multiple phone calls and faxes, I have not been able to secure a facility.   Mr. [name] did contact pt's mother, Rae Mar, this morning and told her of decision from [facility] and that we would be aiming for a d/c by Wednesday and that she needed to coordinate some plan with the family to provide 24/7 supervision.    I received a call from mother just now who is very distressed and asking why pt couldn't d/c to [facility] (psychiatric) or [facility] Neuro-Behavioral program.  Explained that [facility] (or any behavioral health center) would not consider this a psychiatric issues.  [facility] is lisenced as a SNF and pt is not requiring SNF LOC as she does not have medical issues that require daily management or physical assistance.  I have referred pt to [facility] and requesting TBI community care management - await response if they can open a case.  Will also need to coordinate for family to be in and received education on behavioral and risk management of pt in a home setting.  Continue to follow.  Tamario Heal, LCSW
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
## 2016-11-27 ENCOUNTER — Inpatient Hospital Stay (HOSPITAL_COMMUNITY): Payer: Medicaid Other | Admitting: Speech Pathology

## 2016-11-27 ENCOUNTER — Inpatient Hospital Stay (HOSPITAL_COMMUNITY): Payer: Self-pay

## 2016-11-27 ENCOUNTER — Inpatient Hospital Stay (HOSPITAL_COMMUNITY): Payer: Medicaid Other | Admitting: Physical Therapy

## 2016-11-27 NOTE — Progress Notes (Signed)
Pt has been up since 0600. Pt has been assisted to walk in the halls, obtain juice and coffee, pt has also ambulated in the great room and has been rummaging around her room looking for car keys to go home.  Reality orientation and diversion has been done, pt is still perseverating on leaving today for her job.  Jacqueline [name]

## 2016-11-27 NOTE — Progress Notes (Signed)
Speech Language Pathology Daily Session Note  Patient Details  Name: Jacqueline [name] MRN: [id number] Date of Birth: 05/17/1966  Today's Date: 11/27/2016 SLP Individual Time: 1300-1345 SLP Individual Time Calculation (min): 45 min  Short Term Goals: Week 7: SLP Short Term Goal 1 (Week 7): Patient will demonstrate sustained attention to a task for 15 minutes with Max A multimodal cues.  SLP Short Term Goal 2 (Week 7): Patient will orient to place and situation with Mod A multimodal cues.  SLP Short Term Goal 3 (Week 7): Patient will demonstrate basic problem solving for self-care tasks with Min A multimodal cues.  SLP Short Term Goal 4 (Week 7): Patient will identify 1 cognitive and 1 physical deficit with Mod A multimodal cues.  SLP Short Term Goal 5 (Week 7): Patient will consume trials of regular textures and demonstrate efficient mastication with complete oral clearance and without overt s/s of aspiration with Mod A verbal cues over 2 sessions prior to upgrade.   Skilled Therapeutic Interventions: Skilled treatment session focused on cognitive goals. SLP facilitated session by providing total A for orientation to place, time and situation. Patient with intermittent confabulation, but was easily redirected. Patient demonstrated selective attention to task of interest in a mildly distracting environment for 30 minutes with Min A verbal cues for redirection. Patient left upright in [place] at [employer] Continue with current plan of care.      Function:   Cognition Comprehension Comprehension assist level: Understands basic 50 - 74% of the time/ requires cueing 25 - 49% of the time  Expression   Expression assist level: Expresses basic 75 - 89% of the time/requires cueing 10 - 24% of the time. Needs helper to occlude trach/needs to repeat words.  Social Interaction Social Interaction assist level: Interacts appropriately 25 - 49% of time - Needs frequent redirection.  Problem Solving  Problem solving assist level: Solves basic 50 - 74% of the time/requires cueing 25 - 49% of the time  Memory Memory assist level: Recognizes or recalls 25 - 49% of the time/requires cueing 50 - 75% of the time    Pain Pain Assessment Pain Assessment: Faces Faces Pain Scale: No hurt  Therapy/Group: Individual Therapy  Braden Cimo 11/27/2016, 2:59 PM
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
## 2016-11-27 NOTE — Progress Notes (Signed)
Physical Therapy Session Note  Patient Details  Name: Jacqueline [name] MRN: [id number] Date of Birth: [birth year]  Today's Date: 11/27/2016 PT Individual Time: 1000-1055 PT Individual Time Calculation (min): 55 min    Skilled Therapeutic Interventions/Progress Updates:    Pt received in enclosure bed. Session focused on grad day activities, orientation and endurance training. Pt negotiated even & uneven surface, ramp, curb, 24 steps (6" + 3") with single rail, and completed car transfer with supervision overall. Pt performed bed mobility in apartment with Mod I, putting pillow cases on pillows prior to task. Pt engaged in playing connect four with max cuing to attend to therapist while giving instructions, pt then able to successfully take turns and correctly play game. Pt utilized nu-step with all 4 extremities (level 5 x 10 minutes) for endurance training. Pt continues to require max cuing for orientation. Pt will state she is in [place] in same sentence and is argumentative when therapist attempts to orient her. Pt also frequently asking to speak to "the general" because she "has to go home". At end of session pt left in geri chair with lap tray donned & at nurses station.  Therapy Documentation Precautions:  Precautions Precautions: Cervical, Fall, Other (comment) Precaution Comments: abdominal incision Required Braces or Orthoses: Cervical Brace Cervical Brace: Hard collar, At all times Restrictions Weight Bearing Restrictions: No  Pain:  No c/o or behaviors demonstrating pain during session.   See Function Navigator for Current Functional Status.   Therapy/Group: Individual Therapy  [name] 11/27/2016, 12:18 PM

## 2016-11-27 NOTE — Plan of Care (Signed)
Problem: RH Memory Goal: LTG Patient demonstrate ability for day to day recall (PT) LTG:  Patient will demonstrate ability for day to day recall/carryover during mobility activities with assist (PT)  Outcome: Not Met (add Reason) 2/2 impaired cognition  Problem: RH Awareness Goal: LTG: Patient will demonstrate intellectual/emergent (PT) LTG: Patient will demonstrate intellectual/emergent/anticipatory awareness with assist during a mobility activity  (PT)  Outcome: Not Met (add Reason) 2/2 impaired cognition

## 2016-11-27 NOTE — Progress Notes (Signed)
Occupational Therapy Session Note  Patient Details  Name: Jacqueline [name] MRN: [id number] Date of Birth: 1965/12/17  Today's Date: 11/27/2016 OT Individual Time: 0700-0800 OT Individual Time Calculation (min): 60 min    Short Term Goals: Week 7:  OT Short Term Goal 1 (Week 7): Pt will demonstrate improved orientation to identify that she is in a hospital with mod verbal cues OT Short Term Goal 2 (Week 7): Pt will attend to bathing tasks for 5 minutes with min verbal cues   Skilled Therapeutic Interventions/Progress Updates:    Pt oriented to place [facility]) and city [facility]) when questioned this morning.  Pt continues to perseverate on "quitting" and going home. Pt easily redirected during therapy session.  Pt requested use of bathroom and completed all tasks at supervision level.  Pt amb without AD in room to search for her car keys and her "belongings" so she can be "ready to go home." Pt amb to Family Room to prepare a cup of coffee and into Day Room to look for her car.  Pt states she can't remember where she parked her car.  Pt redirected back to room and returned to gerichair at nursing station.    Therapy Documentation Precautions:  Precautions Precautions: Cervical, Fall, Other (comment) Precaution Comments: abdominal incision Required Braces or Orthoses: Cervical Brace Cervical Brace: Hard collar, At all times Restrictions Weight Bearing Restrictions: No Pain:  Pt denied pain  See Function Navigator for Current Functional Status.   Therapy/Group: Individual Therapy  Leroy Libman 11/27/2016, 9:57 AM

## 2016-11-27 NOTE — Plan of Care (Signed)
Problem: RH SAFETY Goal: RH STG DECREASED RISK OF FALL WITH ASSISTANCE STG Decreased Risk of Fall With mod Assistance.  Outcome: Not Progressing Max assist- impulsive
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
## 2016-11-27 NOTE — Progress Notes (Signed)
Physical Therapy Discharge Summary  Patient Details  Name: Jacqueline [name] MRN: [id number] Date of Birth: 1965/11/17  Today's Date: 12/04/2016  Patient has met 8 of 10 long term goals due to improved activity tolerance, improved balance, improved postural control, increased strength, ability to compensate for deficits and improved attention.  Patient to discharge at an ambulatory level Supervision.   No family or friends has been present for formal caregiver education/training.  This therapist recommends that pt d/c home with 24 hr supervision for safety.  Reasons goals not met: impaired cognition, safety awareness, problem solving, memory  Recommendation:  Patient will benefit from ongoing skilled PT services in home health setting to continue to advance safe functional mobility, address ongoing impairments in impaired cognition (problem solving, memory, safety awareness) and impaired balance, and minimize fall risk.  Equipment: No equipment provided  Reasons for discharge: treatment goals met and discharge from hospital  Patient/family agrees with progress made and goals achieved: Yes  PT Discharge Precautions/Restrictions Precautions Precautions: Fall   Pain Pain Assessment Pain Assessment: Faces Faces Pain Scale: No hurt  Cognition Overall Cognitive Status: Impaired/Different from baseline Arousal/Alertness: Awake/alert Orientation Level: Oriented to person Attention: Sustained Sustained Attention: Impaired Sustained Attention Impairment: Functional complex Memory: Impaired Memory Impairment: Decreased recall of new information;Decreased short term memory Awareness: Impaired Awareness Impairment: Intellectual impairment Problem Solving: Impaired Problem Solving Impairment: Functional basic;Verbal basic Safety/Judgment: Impaired Rancho Los Amigos Scales of Cognitive Functioning: Confused/inappropriate/non-agitated  Mobility Bed Mobility Bed Mobility: Supine to  Sit;Sit to Supine Supine to Sit: 6: Modified independent (Device/Increase time) Sit to Supine: 6: Modified independent (Device/Increase time) Transfers Transfers: Yes Sit to Stand: 6: Modified independent (Device/Increase time)  Locomotion  Ambulation Ambulation: Yes Ambulation/Gait Assistance: 5: Supervision Ambulation Distance (Feet): 200 Feet Assistive device: None Stairs / Additional Locomotion Stairs: Yes Stairs Assistance: 5: Supervision Stair Management Technique: One rail Right Number of Stairs: 24 Height of Stairs:  (6" + 3") Ramp: 5: Supervision Curb: 5: Building services engineer Mobility: No   Trunk/Postural Assessment  Postural Control Postural Control: Deficits on evaluation Righting Reactions: impaired   Balance Balance Balance Assessed: Yes Dynamic Standing Balance Dynamic Standing - Balance Support: No upper extremity supported Dynamic Standing - Level of Assistance: 5: Stand by assistance Dynamic Standing - Comments: ambulation  Extremity Assessment  RLE Assessment RLE Assessment: Within Functional Limits LLE Assessment LLE Assessment: Within Functional Limits   See Function Navigator for Current Functional Status.  [name] 12/04/2016, 12:31 PM

## 2016-11-28 ENCOUNTER — Inpatient Hospital Stay (HOSPITAL_COMMUNITY): Payer: Medicaid Other | Admitting: Physical Therapy

## 2016-11-28 ENCOUNTER — Inpatient Hospital Stay (HOSPITAL_COMMUNITY): Payer: Self-pay

## 2016-11-28 ENCOUNTER — Inpatient Hospital Stay (HOSPITAL_COMMUNITY): Payer: Medicaid Other | Admitting: Speech Pathology

## 2016-11-28 NOTE — Plan of Care (Signed)
Pt's plan of care adjusted to 1-3X/week for OT, PT, and SLP after speaking with care team and discussed with MD in team conference.  [place]on [place], [place], [place]
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
## 2016-11-28 NOTE — Progress Notes (Signed)
Occupational Therapy Session Note  Patient Details  Name: Jacqueline [name] MRN: [id number] Date of Birth: 28-Mar-1966  Today's Date: 11/28/2016 OT Individual Time: 0700-0757 OT Individual Time Calculation (min): 57 min    Short Term Goals: Week 7:  OT Short Term Goal 1 (Week 7): Pt will demonstrate improved orientation to identify that she is in a hospital with mod verbal cues OT Short Term Goal 2 (Week 7): Pt will attend to bathing tasks for 5 minutes with min verbal cues   Skilled Therapeutic Interventions/Progress Updates:    Pt resting in gerichair upon arrival.  Pt transitioned to day room to eat breakfast.  Pt prepared pancakes by pouring syrup and ate one bite before stating she was full.  Pt prepared coffee and initially placed butter in coffee.  Pt corrected and she removed container.  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  Engaged pt in word search puzzle and pt maintained sustained attention for approx 10 mins with min verbal cues.  Pt continues to decline taking a shower or changing clothing.  Pt remained in [place] and returned to nursing station.   Therapy Documentation Precautions:  Precautions Precautions: Fall Precaution Comments: abdominal incision Required Braces or Orthoses: Cervical Brace Cervical Brace: Hard collar, At all times Restrictions Weight Bearing Restrictions: No General:   Vital Signs: Therapy Vitals Temp: 97.8 F (36.6 C) Temp Source: Oral Pulse Rate: 86 Resp: 18 BP: (!) 85/51 Patient Position (if appropriate): Sitting Oxygen Therapy SpO2: 94 % O2 Device: Not Delivered Pain:   ADL: ADL ADL Comments: refer to functional navigator Exercises:   Other Treatments:    See Function Navigator for Current Functional Status.   Therapy/Group: Individual Therapy  Leroy Libman 11/28/2016, 7:58 AM

## 2016-11-28 NOTE — Progress Notes (Signed)
Speech Language Pathology Daily Session Note  Patient Details  Name: Jacqueline [name] MRN: [id number] Date of Birth: [birth year]  Today's Date: 11/28/2016 SLP Individual Time: 1345-1430 SLP Individual Time Calculation (min): 45 min  Short Term Goals: Week 7: SLP Short Term Goal 1 (Week 7): Patient will demonstrate sustained attention to a task for 15 minutes with Max A multimodal cues.  SLP Short Term Goal 2 (Week 7): Patient will orient to place and situation with Mod A multimodal cues.  SLP Short Term Goal 3 (Week 7): Patient will demonstrate basic problem solving for self-care tasks with Min A multimodal cues.  SLP Short Term Goal 4 (Week 7): Patient will identify 1 cognitive and 1 physical deficit with Mod A multimodal cues.  SLP Short Term Goal 5 (Week 7): Patient will consume trials of regular textures and demonstrate efficient mastication with complete oral clearance and without overt s/s of aspiration with Mod A verbal cues over 2 sessions prior to upgrade.   Skilled Therapeutic Interventions: Skilled treatment session focused on cognitive goals. Patient required total A for orientation to time, place and situation due to constant confabulation and for recall of information after a 30 second delay. Patient agreeable to change clothes and participate in basic self-care tasks. Patient required Max A verbal cues for problem solving and safety throughout tasks. Patient left upright in [place] at [employer] Continue with current plan of care.       Function:  Eating Eating   Modified Consistency Diet: Yes Eating Assist Level: Supervision or verbal cues           Cognition Comprehension Comprehension assist level: Understands basic 50 - 74% of the time/ requires cueing 25 - 49% of the time  Expression   Expression assist level: Expresses basic 75 - 89% of the time/requires cueing 10 - 24% of the time. Needs helper to occlude trach/needs to repeat words.  Social Interaction  Social Interaction assist level: Interacts appropriately 25 - 49% of time - Needs frequent redirection.  Problem Solving Problem solving assist level: Solves basic 50 - 74% of the time/requires cueing 25 - 49% of the time  Memory Memory assist level: Recognizes or recalls 25 - 49% of the time/requires cueing 50 - 75% of the time    Pain Pain Assessment Pain Assessment: No/denies pain  Therapy/Group: Individual Therapy  Jamier Urbas 11/28/2016, 4:06 PM
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
## 2016-11-28 NOTE — Progress Notes (Signed)
Physical Therapy Session Note  Patient Details  Name: Jacqueline [name] MRN: [id number] Date of Birth: 13-Jan-1966  Today's Date: 11/28/2016 PT Individual Time: 0906-1003 PT Individual Time Calculation (min): 57 min   Skilled Therapeutic Interventions/Progress Updates:  Pt received in geri-chair in handoff from RN. Pt without c/o or behaviors demonstrating pain. Session focused on cognitive remediation & functional mobility. Pt able to recall that she is in "[facility]" and "[place]" 70% of the time during session. Pt continues to require max cuing for situational orientation, as she still believes she has to "leave and go to my other job". Pt utilized nu-step with all 4 extremities on level 5 x 10 minutes for endurance training. Pt tolerated standing for a maximum of 10 minutes at a time to engage in zoom ball and ball toss while performing cognitive task. Attempted to have pt name food/drink with alternating letters of the alphabet. Pt required min cuing for correct letter and mod cuing to recall accurate items. During session pt ambulated throughout dayroom while carrying board with cups stacked on it without LOB or dropping items. Pt did ask "you got any dope on you?" and later "you got any coke on you?" with therapist redirecting & educating pt. At end of session pt left sitting in geri-chair with lap tray donned & at nurses station.    Therapy Documentation Precautions:  Precautions Precautions: Fall Precaution Comments: abdominal incision Required Braces or Orthoses: Cervical Brace Cervical Brace: Hard collar, At all times Restrictions Weight Bearing Restrictions: No   See Function Navigator for Current Functional Status.   Therapy/Group: Individual Therapy  [name] 11/28/2016, 10:11 AM

## 2016-11-29 MED ORDER — HALOPERIDOL LACTATE 5 MG/ML IJ SOLN
2.0000 mg | INTRAMUSCULAR | Status: DC | PRN
Start: 2016-11-29 — End: 2016-12-11
  Administered 2016-11-30 – 2016-12-10 (×7): 2 mg via INTRAMUSCULAR
  Filled 2016-11-29 (×7): qty 1

## 2016-11-29 MED ORDER — HALOPERIDOL 2 MG PO TABS: 1.0000 mg | ORAL_TABLET | Freq: Three times a day (TID) | ORAL | Status: DC | PRN

## 2016-11-29 MED ORDER — CLONAZEPAM 0.5 MG PO TABS
1.0000 mg | ORAL_TABLET | Freq: Once | ORAL | Status: AC
  Administered 2016-11-29: 1 mg via ORAL
  Filled 2016-11-29: qty 2

## 2016-11-29 MED ORDER — CLONAZEPAM 0.5 MG PO TABS
2.0000 mg | ORAL_TABLET | Freq: Three times a day (TID) | ORAL | Status: DC
  Administered 2016-11-29 – 2016-12-04 (×15): 2 mg via ORAL
  Filled 2016-11-29 (×15): qty 4

## 2016-11-29 MED ORDER — HALOPERIDOL LACTATE 5 MG/ML IJ SOLN: 2.0000 mg | INTRAMUSCULAR | Status: DC | PRN

## 2016-11-29 MED ORDER — HALOPERIDOL 2 MG PO TABS
2.0000 mg | ORAL_TABLET | ORAL | Status: DC | PRN
  Administered 2016-12-03 – 2016-12-11 (×9): 2 mg via ORAL
  Filled 2016-11-29 (×9): qty 1

## 2016-11-29 NOTE — Progress Notes (Signed)
Patient sitting at nursing station coloring and interacting with staff at this time. Educated patient that  enclosure bed was discontinued and low bed will be provided. Reviewed safety needs with patient and instructed patient to call staff verbally or using call bell for any needs. Patient reports "ok". Continue with plan of care. Mliss Sax
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
## 2016-11-29 NOTE — Plan of Care (Signed)
Problem: RH BOWEL ELIMINATION Goal: RH STG MANAGE BOWEL W/MEDICATION W/ASSISTANCE STG Manage Bowel with Medication with mod Assistance.  Pt will notify staff of need to have bm, and will demonstrate independence in this skill.

## 2016-11-29 NOTE — Progress Notes (Deleted)
11/29/16 1800 nursing late entry Patient was insisting to go home spitting, biting,hitting staff , getting out of her chair. NT stayed with patient 1:1 at this time
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
## 2016-11-29 NOTE — Progress Notes (Signed)
Occupational Therapy Weekly Progress Note  Patient Details  Name: Jacqueline [name] MRN: [id number] Date of Birth: 1966/03/05  Beginning of progress report period: November 23, 2016 End of progress report period: November 29, 2016  Patient has met 0 of 2 short term goals.  Pt progress has been inconsistent since the last weekly progress note.  Pt continues to consistently refuses self care tasks with the exception of toileting.  Pt continues to state that she needs "to quit" so she can go home.  Pt occasionally (10% of time) states she is at the hospital in [place] but otherwise requires tot A for orientation.  Pt requires max verbal cues for redirection to tasks.  Pt continues to present as [place] IV-V Patient continues to demonstrate the following deficits: muscle weakness and decreased attention, decreased awareness, decreased problem solving, decreased safety awareness, decreased memory and delayed processing and therefore will continue to benefit from skilled OT intervention to enhance overall performance with BADL, iADL and Reduce care partner burden.  Patient progressing toward long term goals..  Plan of care revisions: Plan is to now see pt 1-3x a week for skilled OT services with careout of same interventions as listed in [place].  OT Short Term Goals Week 7:  OT Short Term Goal 1 (Week 7): Pt will demonstrate improved orientation to identify that she is in a hospital with mod verbal cues OT Short Term Goal 1 - Progress (Week 7): Progressing toward goal OT Short Term Goal 2 (Week 7): Pt will attend to bathing tasks for 5 minutes with min verbal cues  OT Short Term Goal 2 - Progress (Week 7): Progressing toward goal  Skilled Therapeutic Interventions/Progress Updates:      Therapy Documentation Precautions:  Precautions Precautions: Fall Precaution Comments: abdominal incision Required Braces or Orthoses: Cervical Brace Cervical Brace: Hard collar, At all  times Restrictions Weight Bearing Restrictions: No    See Function Navigator for Current Functional Status.    [name] [facility] 11/29/2016, 10:37 AM

## 2016-11-29 NOTE — Plan of Care (Signed)
Problem: RH SKIN INTEGRITY Goal: RH STG ABLE TO PERFORM INCISION/WOUND CARE W/ASSISTANCE STG Able To Perform Incision/Wound Care With max Assistance.  Outcome: Not Progressing Staff will make dressing change education and opportunities for pt to practice a priority.  Pt will begin to demonstrate independence in this.

## 2016-11-29 NOTE — Progress Notes (Signed)
11/29/16  Late entry-1800. Enclosure bed discontinued per MD order 1600. Pt became aggressive to staff by biting, hitting and spiting at staff.  Pt self inflicted wound to arm with fingernails and blamed it on staff. Soil scientist present at time. Pt attempted to elope with staff attempting to redirected pt with no success. Haldol IM given per prn order 1642. Pt continues to get out of chair, stand in the chair and show unsafe behaviors.  RN will continue to monitor pt safety and pass along in report patients status.

## 2016-11-29 NOTE — Progress Notes (Signed)
Social Work Patient ID: Jacqueline [name], female   DOB: May 11, 1966, 51 y.o.   MRN: [id number]   Have been instructed by [name], Rehab Director, to consider pt "abandoned" by family per his conversations with brother.  I have contacted [facility] and left a message for APS social worker to begin process of incompetency and guardianship.  I continue to search for facilities as well as we go through this process.  Dmoni Fortson, LCSW

## 2016-11-29 NOTE — Progress Notes (Signed)
Pt began shift with increased agitation and attempting to leave unit.  Pt escorted by staff to room and enclosure bed. Pt tolerated this poorly.  Staff did frequent checks on patient while other pts were being seen by nurse (myself) assigned to her.   An hour spent with pt doing assessment, doing reality orientation, reinforcing safety guidelines and eliciting teachback.  Pt seemed to respond after being asked to repeat back some infor
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
## 2016-11-30 ENCOUNTER — Inpatient Hospital Stay (HOSPITAL_COMMUNITY): Payer: Medicaid Other | Admitting: Speech Pathology

## 2016-11-30 ENCOUNTER — Inpatient Hospital Stay (HOSPITAL_COMMUNITY): Payer: Self-pay

## 2016-11-30 MED ORDER — RISPERIDONE 1 MG PO TABS
1.0000 mg | ORAL_TABLET | Freq: Two times a day (BID) | ORAL | Status: DC
  Administered 2016-11-30 – 2016-12-12 (×24): 1 mg via ORAL
  Filled 2016-11-30 (×25): qty 1

## 2016-11-30 NOTE — Progress Notes (Signed)
Pt resting in room but agitated. Visitor Juanda Crumble came to visit patient and visitor was escalating patient behaviors. Pt began screaming and attempting to leave the unit. Visitor became verbally abusive to staff and visitor was asked to leave due to the fact that patient needed to have decreased stimulation. Visitor refused to leave and security was called to escort visitor out of unit. Pt very upset after visit. Unit director and charge RN notified of incident.

## 2016-11-30 NOTE — Plan of Care (Signed)
Problem: RH PAIN MANAGEMENT Goal: RH STG PAIN MANAGED AT OR BELOW PT'S PAIN GOAL Pain < or = 4 with min assistance  Outcome: Progressing By use of pain scale, alternative therapies, ordered medications, pt will be free from pain or will keep pain to a tolerable level.

## 2016-11-30 NOTE — Progress Notes (Signed)
Occupational Therapy Session Note  Patient Details  Name: Jacqueline [name] MRN: [id number] Date of Birth: 27-May-1966  Today's Date: 11/30/2016 OT Individual Time: 0900-0930 OT Individual Time Calculation (min): 30 min    Short Term Goals: Week 8:  OT Short Term Goal 1 (Week 7): Pt will demonstrate improved orientation to identify that she is in a hospital with mod verbal cues OT Short Term Goal 1 - Progress (Week 7): Progressing toward goal OT Short Term Goal 2 (Week 7): Pt will attend to bathing tasks for 5 minutes with min verbal cues  OT Short Term Goal 2 - Progress (Week 7): Progressing toward goal  Skilled Therapeutic Interventions/Progress Updates:    Pt resting in gerichair upon arrival with NT present.  Attempted to engage pt in word search puzzles.  Pt with increased lethargy this morning and exhibited difficulty keeping her eyes open.  Pt required tot A to locate three words on puzzle.  Pt confused therapist with friend/coworker from [place].  Pt remained in gerichair with NT present.   Therapy Documentation Precautions:  Precautions Precautions: Fall Precaution Comments: abdominal incision Required Braces or Orthoses: Cervical Brace Cervical Brace: Hard collar, At all times Restrictions Weight Bearing Restrictions: No  Pain: Pain Assessment Pain Assessment: 0-10 Pain Score: 0-No pain  See Function Navigator for Current Functional Status.   Therapy/Group: Individual Therapy  Leroy Libman 11/30/2016, 9:34 AM

## 2016-11-30 NOTE — Progress Notes (Signed)
Speech Language Pathology Weekly Progress and Session Note  Patient Details  Name: Jacqueline [name] MRN: [id number] Date of Birth: 10-12-1965  Beginning of progress report period: November 21, 2016 End of progress report period: November 30, 2016     Short Term Goals: Week 7: SLP Short Term Goal 1 (Week 7): Patient will demonstrate sustained attention to a task for 15 minutes with Max A multimodal cues.  SLP Short Term Goal 1 - Progress (Week 7): Not met SLP Short Term Goal 2 (Week 7): Patient will orient to place and situation with Mod A multimodal cues.  SLP Short Term Goal 2 - Progress (Week 7): Not met SLP Short Term Goal 3 (Week 7): Patient will demonstrate basic problem solving for self-care tasks with Min A multimodal cues.  SLP Short Term Goal 3 - Progress (Week 7): Not met SLP Short Term Goal 4 (Week 7): Patient will identify 1 cognitive and 1 physical deficit with Mod A multimodal cues.  SLP Short Term Goal 4 - Progress (Week 7): Not met SLP Short Term Goal 5 (Week 7): Patient will consume trials of regular textures and demonstrate efficient mastication with complete oral clearance and without overt s/s of aspiration with Mod A verbal cues over 2 sessions prior to upgrade.  SLP Short Term Goal 5 - Progress (Week 7): Not met    New Short Term Goals: Week 8: SLP Short Term Goal 1 (Week 8): Patient will demonstrate sustained attention to a task for 15 minutes with Max A multimodal cues.  SLP Short Term Goal 2 (Week 8): Patient will orient to place and situation with Mod A multimodal cues.  SLP Short Term Goal 3 (Week 8): Patient will demonstrate basic problem solving for self-care tasks with Min A multimodal cues.  SLP Short Term Goal 4 (Week 8): Patient will identify 1 cognitive and 1 physical deficit with Mod A multimodal cues.  SLP Short Term Goal 5 (Week 8): Patient will consume trials of regular textures and demonstrate efficient mastication with complete oral clearance and  without overt s/s of aspiration with Mod A verbal cues over 2 sessions prior to upgrade.   Weekly Progress Updates: Patient continues to make minimal gains and has not met any STG's this reporting period. Currently, patient continues to demonstrate behaviors consistent with a Rancho Level IV-V and requires overall Max-Total A to complete functional and familiar tasks safely in regards to attention, problem solving, orientation, recall and awareness. Patient continues to demonstrate intermittent agitation, constant confabulation and perseveration on going home with her biggest barrier being minimal working and short-term memory. Due to patient's limited progress, her current plan of care has been changed to 1-3X/week while awaiting discharge to maximize routine and increase ease of transition to a new venue of care.      Intensity: Minumum of 1-2 x/day, 30 to 90 minutes Frequency: 1 to 3 out of 7 days Duration/Length of Stay: TBD due to SNF placement  Treatment/Interventions: Cognitive remediation/compensation;Cueing hierarchy;Functional tasks;Patient/family education;Therapeutic Activities;Internal/external aids;Dysphagia/aspiration precaution training;Environmental controls           Jacqueline [name] 11/30/2016, 7:11 AM

## 2016-11-30 NOTE — Progress Notes (Signed)
Speech Language Pathology Daily Session Note  Patient Details  Name: Jacqueline [name] MRN: [id number] Date of Birth: [birth year]  Today's Date: 11/30/2016 SLP Individual Time: [id number] SLP Individual Time Calculation (min): 45 min  Short Term Goals: Week 8: SLP Short Term Goal 1 (Week 8): Patient will demonstrate sustained attention to a task for 15 minutes with Max A multimodal cues.  SLP Short Term Goal 2 (Week 8): Patient will orient to place and situation with Mod A multimodal cues.  SLP Short Term Goal 3 (Week 8): Patient will demonstrate basic problem solving for self-care tasks with Min A multimodal cues.  SLP Short Term Goal 4 (Week 8): Patient will identify 1 cognitive and 1 physical deficit with Mod A multimodal cues.  SLP Short Term Goal 5 (Week 8): Patient will consume trials of regular textures and demonstrate efficient mastication with complete oral clearance and without overt s/s of aspiration with Mod A verbal cues over 2 sessions prior to upgrade.    Skilled Therapeutic Interventions: Skilled treatment session focused on cognition goals. SLP facilitated session by providing Mod A verbal cues for completion of basic task (decorating bulletin board). Pt required Max A verbal cues to sustain attention for ~15 minutes. Pt in her geri-chair and was handed off to nursing for supervision.      Function:   Comprehension Comprehension assist level: Understands basic 50 - 74% of the time/ requires cueing 25 - 49% of the time  Expression   Expression assist level: Expresses basic 75 - 89% of the time/requires cueing 10 - 24% of the time. Needs helper to occlude trach/needs to repeat words.  Social Interaction Social Interaction assist level: Interacts appropriately 25 - 49% of time - Needs frequent redirection.  Problem Solving Problem solving assist level: Solves basic 50 - 74% of the time/requires cueing 25 - 49% of the time  Memory Memory assist level: Recognizes or recalls 25 -  49% of the time/requires cueing 50 - 75% of the time    Pain Pain Assessment Pain Assessment: 0-10 Pain Score: 0-No pain  Therapy/Group: Individual Therapy  Dorice Stiggers B. Rutherford Nail, M.S., CCC-SLP Speech-Language Pathologist  Kentrell Hallahan 11/30/2016, 12:40 PM
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
## 2016-11-30 NOTE — Progress Notes (Signed)
Social Work Patient ID: Jacqueline [name], female   DOB: Feb 01, 1966, 51 y.o.   MRN: [id number]   Received call back from [facility] who explained that they would not be taking case as pt is not "legally abandoned" as family/ mother was not her caregiver PTA and they had not committed to providing care to her.  I have given that information to [name], [name].  Discussion with [name] and [name],  Nursing Director yesterday afternoon to make plan for moving forward. Mr. [name] reports that, per discussion with [name], Director of Care Management, we will make changes to pt's care plan (i.e. Remove enclosure bed, no sitters and medication changes - per MD) and will observe pt's behavior and care needs through the weekend.  On Monday, plan to all regroup and determine LOC at that point and consider if any changes need to be made to facilities/ LOC being pursued so far.    [name], LCSW
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
## 2016-12-01 DIAGNOSIS — F068 Other specified mental disorders due to known physiological condition: Secondary | ICD-10-CM

## 2016-12-01 DIAGNOSIS — Z298 Encounter for other specified prophylactic measures: Secondary | ICD-10-CM

## 2016-12-01 DIAGNOSIS — S069X0S Unspecified intracranial injury without loss of consciousness, sequela: Secondary | ICD-10-CM

## 2016-12-01 DIAGNOSIS — R451 Restlessness and agitation: Secondary | ICD-10-CM
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
## 2016-12-01 NOTE — Progress Notes (Signed)
Patient woke up at around 4:00 am. Stood up and wanting to get out of her room. NT explained that she cannot leave the room at the moment. Patient then spit on NT. NT redirected patient to the bathroom to void. Patient went back to bed and asked for coffee. Will monitor.

## 2016-12-02 NOTE — Progress Notes (Signed)
Jacqueline [name] is a 51 y.o. female 1966/02/05 [id number]  Subjective: No new complaints. Slept well. Feeling OK. Requesting to have "my psychiatrist sign my papers when they get here"  Objective: Vital signs in last 24 hours: Temp:  [98.2 F (36.8 C)] 98.2 F (36.8 C) (02/25 0443) Pulse Rate:  [56] 56 (02/25 0443) Resp:  [18] 18 (02/25 0443) BP: (90)/(48) 90/48 (02/25 0443) SpO2:  [100 %] 100 % (02/25 0443) Weight:  [81.2 kg (179 lb)] 81.2 kg (179 lb) (02/25 0443) Weight change: 5.394 kg (11 lb 14.3 oz) Last BM Date: 12/01/16  Intake/Output from previous day: No intake/output data recorded.  Physical Exam General: No apparent distress   Confused but seems baseline Lungs: Normal effort. Lungs clear to auscultation, no crackles or wheezes. Cardiovascular: Regular rate and rhythm, no edema Neurological: No new neurological deficits  Lab Results: BMET    Component Value Date/Time   NA 136 11/14/2016 0854   NA 142 05/16/2014 2310   K 4.0 11/14/2016 0854   K 3.5 05/16/2014 2310   CL 103 11/14/2016 0854   CL 106 05/16/2014 2310   CO2 25 11/14/2016 0854   CO2 27 05/16/2014 2310   GLUCOSE 90 11/14/2016 0854   GLUCOSE 111 (H) 05/16/2014 2310   BUN <5 (L) 11/14/2016 0854   BUN 10 05/16/2014 2310   CREATININE 0.73 11/14/2016 0854   CREATININE 0.96 05/16/2014 2310   CALCIUM 8.6 (L) 11/14/2016 0854   CALCIUM 7.5 (L) 05/16/2014 2310   GFRNONAA >60 11/14/2016 0854   GFRNONAA >60 05/16/2014 2310   GFRAA >60 11/14/2016 0854   GFRAA >60 05/16/2014 2310   CBC    Component Value Date/Time   WBC 5.7 11/14/2016 0854   RBC 3.92 11/14/2016 0854   HGB 11.5 (L) 11/14/2016 0854   HGB 10.1 (L) 05/16/2014 2310   HCT 36.7 11/14/2016 0854   HCT 32.4 (L) 05/16/2014 2310   PLT 258 11/14/2016 0854   PLT 230 05/16/2014 2310   MCV 93.6 11/14/2016 0854   MCV 86 05/16/2014 2310   MCH 29.3 11/14/2016 0854   MCHC 31.3 11/14/2016 0854   RDW 19.0 (H) 11/14/2016 0854   RDW 23.2 (H)  05/16/2014 2310   LYMPHSABS 2.0 10/22/2016 0513   MONOABS 1.8 (H) 10/22/2016 0513   EOSABS 0.2 10/22/2016 0513   BASOSABS 0.1 10/22/2016 0513   CBG's (last 3):  No results for input(s): GLUCAP in the last 72 hours. LFT's Lab Results  Component Value Date   ALT 14 10/18/2016   AST 19 10/18/2016   ALKPHOS 155 (H) 10/18/2016   BILITOT 0.6 10/18/2016    Studies/Results: No results found.  Medications:  I have reviewed the patient's current medications. Scheduled Medications: . chlordiazePOXIDE  5 mg Oral QHS  . citalopram  40 mg Oral QHS  . clonazePAM  2 mg Oral TID  . feeding supplement (ENSURE ENLIVE)  237 mL Oral TID BM  . mouth rinse  15 mL Mouth Rinse BID  . neomycin-bacitracin-polymyxin   Topical BID  . nicotine  21 mg Transdermal Daily  . pantoprazole  40 mg Oral Daily  . polycarbophil  625 mg Oral Daily  . potassium chloride  20 mEq Oral Daily  . risperiDONE  2 mg Oral QHS  . risperiDONE  1 mg Oral BID  . thiamine  100 mg Oral Daily  . 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 Asa Lente, MD 12/02/2016, 10:01 AM

## 2016-12-02 NOTE — Progress Notes (Signed)
Physical Therapy Weekly Progress Note  Patient Details  Name: Jacqueline [name] MRN: [id number] Date of Birth: June 15, 1966  Beginning of progress report period: November 20, 2016 End of progress report period: December 02, 2016   Pt has met all physical LTG's but continues to work towards reaching cognitive LTG's. Pt continues to require max cuing for orientation, safety awareness, problem solving, and overall awareness. Treatment team & MD aware of pt's progress in therapy & continue to work towards d/c.   Patient continues to demonstrate the following deficits, decreased attention, decreased awareness, decreased problem solving, decreased safety awareness, decreased memory and delayed processing, and decreased standing balance and decreased balance strategies and therefore will continue to benefit from skilled PT intervention to increase functional independence with mobility and cognition.  Patient progressing toward long term goals..  Continue plan of care.  Therapy Documentation Precautions:  Precautions Precautions: Fall Precaution Comments: abdominal incision Required Braces or Orthoses: Cervical Brace Cervical Brace: Hard collar, At all times Restrictions Weight Bearing Restrictions: No  See Function Navigator for Current Functional Status.   [name] 12/02/2016, 8:34 AM

## 2016-12-03 ENCOUNTER — Inpatient Hospital Stay (HOSPITAL_COMMUNITY): Payer: Self-pay | Admitting: Physical Therapy

## 2016-12-03 ENCOUNTER — Inpatient Hospital Stay (HOSPITAL_COMMUNITY): Payer: Self-pay

## 2016-12-03 ENCOUNTER — Inpatient Hospital Stay (HOSPITAL_COMMUNITY): Payer: Medicaid Other | Admitting: Speech Pathology

## 2016-12-03 NOTE — Progress Notes (Signed)
Occupational Therapy Session Note  Patient Details  Name: Jacqueline [name] MRN: [id number] Date of Birth: 1965-10-18  Today's Date: 12/03/2016 OT Individual Time: 1400-1430 OT Individual Time Calculation (min): 30 min    Short Term Goals: Week 8:  OT Short Term Goal 1 (Week 8): Pt will demonstrate improved orientation to identify that she is in a hospital with mod verbal cues OT Short Term Goal 2 (Week 8): Pt will attend to bathing tasks for 5 minutes with min verbal cues    Skilled Therapeutic Interventions/Progress Updates:    Pt sitting at table in dayroom upon arrival with NT present.  Pt listening to "Purple Rain".  Halfway through the song, pt stated that the performer singing [place] was not [place] and could not be convinced otherwise.  Another version of Purple Rain with Alfonse Spruce (same video) was started and pt pleased with change.  Pt engaged in applying fingernail polish and singing along with song.  Pt also stated she enjoyed [place]. NT arrived and relieved therapist.  Pt stated she was in [place], [place] and was not convinced otherwise.   Therapy Documentation Precautions:  Precautions Precautions: Fall Precaution Comments: abdominal incision Required Braces or Orthoses: Cervical Brace Cervical Brace: Hard collar, At all times Restrictions Weight Bearing Restrictions: No General:   Vital Signs:  Pain: Pt denies pain   See Function Navigator for Current Functional Status.   Therapy/Group: Individual Therapy  Leroy Libman 12/03/2016, 2:46 PM
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
## 2016-12-03 NOTE — Progress Notes (Signed)
Speech Language Pathology Daily Session Note  Patient Details  Name: Jacqueline [name] MRN: [id number] Date of Birth: 06/02/1966  Today's Date: 12/03/2016 SLP Individual Time: [id number] SLP Individual Time Calculation (min): 45 min  Short Term Goals: Week 8: SLP Short Term Goal 1 (Week 8): Patient will demonstrate sustained attention to a task for 15 minutes with Max A multimodal cues.  SLP Short Term Goal 2 (Week 8): Patient will orient to place and situation with Mod A multimodal cues.  SLP Short Term Goal 3 (Week 8): Patient will demonstrate basic problem solving for self-care tasks with Min A multimodal cues.  SLP Short Term Goal 4 (Week 8): Patient will identify 1 cognitive and 1 physical deficit with Mod A multimodal cues.  SLP Short Term Goal 5 (Week 8): Patient will consume trials of regular textures and demonstrate efficient mastication with complete oral clearance and without overt s/s of aspiration with Mod A verbal cues over 2 sessions prior to upgrade.    Skilled Therapeutic Interventions: Skilled treatment session focused on cognition goals. SLP facilitated session by providing Max A multimodal cues for orientation to current situation. Pt perseverative on missing court date today. Pt able to redirect to bulletin board tasks with Min A verbal cues for ~2 minute intervals. Pt requested to go back to room and pt able to perform simple room cleaning tasks with Mod A verbal cues. Pt was left with nursing in room. Continue per current plan of care.      Function:   Cognition Comprehension Comprehension assist level: Understands basic 50 - 74% of the time/ requires cueing 25 - 49% of the time  Expression   Expression assist level: Expresses basic 50 - 74% of the time/requires cueing 25 - 49% of the time. Needs to repeat parts of sentences.  Social Interaction Social Interaction assist level: Interacts appropriately with others with medication or extra time (anti-anxiety,  antidepressant).  Problem Solving Problem solving assist level: Solves basic 50 - 74% of the time/requires cueing 25 - 49% of the time  Memory Memory assist level: Recognizes or recalls 25 - 49% of the time/requires cueing 50 - 75% of the time    Pain    Therapy/Group: Individual Therapy   Jovonte Commins B. Rutherford Nail, M.S., CCC-SLP Speech-Language Pathologist   Deosha Werden 12/03/2016, 11:36 AM

## 2016-12-03 NOTE — Progress Notes (Signed)
Physical Therapy Note  Patient Details  Name: Jacqueline [name] MRN: [id number] Date of Birth: 1965-10-25 Today's Date: 12/03/2016    Time: 1140-1205 25 minutes  1:1 No c/o pain.  Session continues to focus on orientation and awareness. Pt mod I for mobility and participated in nustep and word search task with max cuing for working memory throughout session, max cuing for orientation to place and situation. Pt continues to believe she is at work and is going to go home soon.   [name] 12/03/2016, 12:08 PM

## 2016-12-03 NOTE — Progress Notes (Addendum)
CNA reported that pt attempted to remove contacts with betadine, alcohol, and tweezers.  Items removed from room as well as all other items used for dressing changes, leaving pt clothing only. Pt calm and co-operative and sitting on edge of bed at this time. Will continue with plan of care.

## 2016-12-04 DIAGNOSIS — I952 Hypotension due to drugs: Secondary | ICD-10-CM
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
## 2016-12-04 NOTE — Progress Notes (Signed)
Pt sister called to talk to pt and after phone call pt was not able to be redirected.
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
## 2016-12-04 NOTE — Progress Notes (Signed)
Social Work Patient ID: Jacqueline [name], female   DOB: [birth year], 51 y.o.   MRN: [id number]   Per instruction from [name], Rehab Dir and [place]., I resent FL2 out to ALFs yesterday and contacted agencies to continue to pursue ALF bed.  No bed offers received and targeted facilities have all declined.  Await further instruction form Mr. Huston and Ms. Rife.   Also, need to further question about submitting an incompetency plea to pursue guardianship as discussed earlier with administration.  This may open other possible d/c options to [name] I.e. Neurobehavioral unit at [facility].  Continue to follow.  Francia Verry, LCSW

## 2016-12-04 NOTE — Consult Note (Addendum)
[place] Nurse wound consult note Reason for Consult: Consult requested for abd wound Wound type: Full thickness post-op wound from several months ago, appears to be slow to heal.  Bedside nurse states it drained pus yesterday and had a pocket which was deeper than the surrounding wound. Measurement: 10X1.5X.1cm, midline abd wound is red, moist and slightly raised above skin level with some hypergranulation tissue. There are 2 fluctuant areas in the middle; each is approx .3X.3X.8cm when swab inserted to measure depth.  Mod amt thick tan drainage, no odor, pt denies c/o pain.  Dressing procedure/placement/frequency: Applied 2 wicks of Aquacel into the deeper areas to absorb drainage and provide antimicrobial benefits, and another piece over the wound bed to attempt to control hypergranulation and wick away excess moisture. Please re-consult if further assistance is needed.  Thank-you,  Julien Girt MSN, [place], [place], [place], [place]

## 2016-12-04 NOTE — Progress Notes (Signed)
Pt noted to be calm, no behaviors this far.  Able to easily redirect pt at this time. Will continue plan of care.

## 2016-12-05 ENCOUNTER — Inpatient Hospital Stay (HOSPITAL_COMMUNITY): Payer: Self-pay

## 2016-12-05 MED ORDER — CLONAZEPAM 0.5 MG PO TABS
2.0000 mg | ORAL_TABLET | Freq: Three times a day (TID) | ORAL | Status: DC
  Administered 2016-12-05 – 2016-12-12 (×22): 2 mg via ORAL
  Filled 2016-12-05 (×23): qty 4

## 2016-12-05 MED ORDER — PROPRANOLOL HCL 10 MG PO TABS
10.0000 mg | ORAL_TABLET | Freq: Three times a day (TID) | ORAL | Status: DC
  Administered 2016-12-05 – 2016-12-06 (×3): 10 mg via ORAL
  Filled 2016-12-05 (×4): qty 1
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
## 2016-12-05 NOTE — Progress Notes (Signed)
Occupational Therapy Discharge Summary  Patient Details  Name: Jacqueline [name] MRN: [id number] Date of Birth: 1965/10/21   Patient has met 10 of 12 long term goals due to improved activity tolerance, improved balance, postural control, ability to compensate for deficits, improved awareness and improved coordination.  Although pt progressed with bathing/dressing/toileting tasks and completes tasks with close supervision, pt continued to required tot A for consistent orientation to place, date, and situation. Pt requires max verbal cues for redirection to task and persistently perseverates on returning home.  Pt consistently confuses staff members with acquaintances in her life and becomes agitated when challenged. Pt requires tot A for intellectual awareness of deficits. Pt exhibits behaviors consistent with [place] Level VI. Patient to discharge at overall Supervision level.  Patient's care partner unavailable to provide the necessary physical and cognitive assistance at discharge.    Reasons goals not met: Pt still requires max- total A for cognition and can be difficult to redirect at times.   Recommendation:  Patient will benefit from ongoing skilled OT services in home health setting to continue to advance functional skills in the area of BADL and Reduce care partner burden.  Equipment: No equipment provided  Reasons for discharge: lack of progress toward goals and treatment goals met  Patient/family agrees with progress made and goals achieved: Yes  OT Discharge Vision/Perception  Vision- History Baseline Vision/History: Wears glasses Wears Glasses: At all times Vision- Assessment Vision Assessment?: Vision impaired- to be further tested in functional context Perception Comments: unable to adequately assess secondary to cognitive deficits  Cognition Overall Cognitive Status: Impaired/Different from baseline Arousal/Alertness: Awake/alert Orientation Level: Oriented to  person Attention: Sustained Focused Attention: Appears intact Sustained Attention: Appears intact Memory: Impaired Memory Impairment: Decreased recall of new information;Decreased short term memory Decreased Short Term Memory: Verbal basic;Functional basic Awareness Impairment: Intellectual impairment Problem Solving: Impaired Problem Solving Impairment: Functional basic;Verbal basic Behaviors: Impulsive;Verbal agitation;Poor frustration tolerance Safety/Judgment: Impaired [place] [employer] Scales of Cognitive Functioning: Confused/appropriate/non-agitated Sensation Sensation Light Touch: Appears Intact Stereognosis: Not tested Hot/Cold: Appears Intact Proprioception: Appears Intact Coordination Gross Motor Movements are Fluid and Coordinated: Yes Fine Motor Movements are Fluid and Coordinated: Yes Trunk/Postural Assessment  Cervical Assessment Cervical Assessment: Within Functional Limits Thoracic Assessment Thoracic Assessment: Within Functional Limits Lumbar Assessment Lumbar Assessment: Within Functional Limits Postural Control Postural Control: Within Functional Limits  Balance Dynamic Sitting Balance Dynamic Sitting - Level of Assistance: 5: Stand by assistance Extremity/Trunk Assessment RUE Assessment RUE Assessment: Within Functional Limits LUE Assessment LUE Assessment: Within Functional Limits   See Function Navigator for Current Functional Status.  [name] [facility] 12/05/2016, 6:55 AM
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
## 2016-12-05 NOTE — Progress Notes (Signed)
Social Work Patient ID: Phillis Haggis, female   DOB: 1965-10-30, 51 y.o.   MRN: [id number]   Per [name] and Dr. Reynaldo Minium, Dr. Reynaldo Minium believes that the new wound that was evaluated by WOC yesterday is sufficient to upgrade pt to SNF level of care.  Dr. Reynaldo Minium requests that I pursue new PASARR and update current FLs to reflect wound and send to SNFs.  He also requests that a referral be place for psychiatry service to review pt's case and make any needed recommendations to med management. Fl2 updated, new PASARR submitted and await approval and have spoken with Marlowe Shores, PA about referral to psych.  Continue to follow.  Kelin Borum, LCSW

## 2016-12-06 DIAGNOSIS — K5901 Slow transit constipation: Secondary | ICD-10-CM

## 2016-12-06 MED ORDER — CLONAZEPAM 0.5 MG PO TABS
2.0000 mg | ORAL_TABLET | Freq: Two times a day (BID) | ORAL | Status: DC | PRN
  Administered 2016-12-08 – 2016-12-12 (×5): 2 mg via ORAL
  Filled 2016-12-06 (×5): qty 4

## 2016-12-06 MED ORDER — SENNOSIDES-DOCUSATE SODIUM 8.6-50 MG PO TABS
2.0000 | ORAL_TABLET | Freq: Every day | ORAL | Status: DC
  Administered 2016-12-06 – 2016-12-11 (×6): 2 via ORAL
  Filled 2016-12-06 (×6): qty 2

## 2016-12-06 MED ORDER — POLYETHYLENE GLYCOL 3350 17 G PO PACK
17.0000 g | PACK | Freq: Every day | ORAL | Status: DC
  Administered 2016-12-06 – 2016-12-12 (×7): 17 g via ORAL
  Filled 2016-12-06 (×7): qty 1

## 2016-12-06 NOTE — Progress Notes (Signed)
*  Late Entry*  Patient noted to be drowsy most of the day on 12/05/16. Patient began with daily onset of agitation and aggression that required 2 mg. IM injection of Haldol at approximately 1600. Patient continuously attempting to leave room and head toward fire escape doors. Patient had to be redirected back to room. All scheduled medications given and patient still agitated and aggressive towards staff. Dorthula Nettles, House

## 2016-12-06 NOTE — Plan of Care (Signed)
Problem: RH BOWEL ELIMINATION Goal: RH STG MANAGE BOWEL WITH ASSISTANCE STG Manage Bowel with mod Assistance.  Outcome: Not Progressing LBM 2/25; laxatives on board
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
## 2016-12-06 NOTE — Progress Notes (Signed)
*  Late Entry*  This RN and night shift RN completed abdominal dressing change on patient. Wound packed with Aquacel and Aquacel placed over outside of wound and dressed with tape as ordered. Patient redirected multiple times not to touch the open wound. Patient had no complaints of pain or discomfort during dressing change and all materials removed from the room after completion for patient safety. Dorthula Nettles RN3

## 2016-12-07 LAB — CBC WITH DIFFERENTIAL/PLATELET
BASOS ABS: 0 10*3/uL (ref 0.0–0.1)
BASOS PCT: 1 %
EOS PCT: 5 %
Eosinophils Absolute: 0.3 10*3/uL (ref 0.0–0.7)
HEMATOCRIT: 34.3 % — AB (ref 36.0–46.0)
Hemoglobin: 11.1 g/dL — ABNORMAL LOW (ref 12.0–15.0)
Lymphocytes Relative: 26 %
Lymphs Abs: 1.7 10*3/uL (ref 0.7–4.0)
MCH: 30.2 pg (ref 26.0–34.0)
MCHC: 32.4 g/dL (ref 30.0–36.0)
MCV: 93.5 fL (ref 78.0–100.0)
MONO ABS: 0.9 10*3/uL (ref 0.1–1.0)
MONOS PCT: 14 %
NEUTROS ABS: 3.5 10*3/uL (ref 1.7–7.7)
Neutrophils Relative %: 54 %
PLATELETS: 274 10*3/uL (ref 150–400)
RBC: 3.67 MIL/uL — ABNORMAL LOW (ref 3.87–5.11)
RDW: 17.2 % — AB (ref 11.5–15.5)
WBC: 6.3 10*3/uL (ref 4.0–10.5)

## 2016-12-07 LAB — BASIC METABOLIC PANEL
ANION GAP: 8 (ref 5–15)
BUN: 5 mg/dL — AB (ref 6–20)
CALCIUM: 8.5 mg/dL — AB (ref 8.9–10.3)
CO2: 26 mmol/L (ref 22–32)
Chloride: 105 mmol/L (ref 101–111)
Creatinine, Ser: 0.67 mg/dL (ref 0.44–1.00)
GFR calc Af Amer: >60 mL/min (ref >60)
GLUCOSE: 73 mg/dL (ref 65–99)
Potassium: 4.3 mmol/L (ref 3.5–5.1)
Sodium: 139 mmol/L (ref 135–145)

## 2016-12-07 MED ORDER — BENZOCAINE 10 % MT GEL
Freq: Three times a day (TID) | OROMUCOSAL | Status: DC | PRN
  Administered 2016-12-07 – 2016-12-10 (×5): via OROMUCOSAL
  Filled 2016-12-07: qty 9.4

## 2016-12-07 MED ORDER — CHLORHEXIDINE GLUCONATE 0.12 % MT SOLN
15.0000 mL | Freq: Four times a day (QID) | OROMUCOSAL | Status: DC
  Administered 2016-12-07 – 2016-12-10 (×7): 15 mL via OROMUCOSAL
  Filled 2016-12-07 (×10): qty 15

## 2016-12-07 NOTE — Progress Notes (Signed)
[place] PHYSICAL MEDICINE & REHABILITATION     PROGRESS NOTE    Subjective/Complaints: Pt seen laying in bed.  No reported issues overnight. She is sleepy this AM.   ROS: Limited due cognitive/behavioral    Objective: Vital Signs: Blood pressure (!) 85/64, pulse 67, temperature 98 F (36.7 C), temperature source Oral, resp. rate 18, weight 81 kg (178 lb 9.6 oz), SpO2 96 %. No results found.  Recent Labs  12/07/16 0726  WBC 6.3  HGB 11.1*  HCT 34.3*  PLT 274    Recent Labs  12/07/16 0726  NA 139  K 4.3  CL 105  GLUCOSE 73  BUN 5*  CREATININE 0.67  CALCIUM 8.5*   CBG (last 3)  No results for input(s): GLUCAP in the last 72 hours.  Wt Readings from Last 3 Encounters:  12/06/16 81 kg (178 lb 9.6 oz)  10/09/16 71.4 kg (157 lb 4.8 oz)  08/03/16 93 kg (205 lb)    Physical Exam:  Gen: NAD. Vital signs reviewed. Card: RRR. No JVD. Respiratory: CTA. Unlabored. GI: Soft, non-distended.  Musc: No edema, no tenderness Neurological: remains confused Motor: spontaneously moving all 4 ext.   Skin: Abd wound with drainage and hyperganulation  Psych: confused. tangential  Assessment/Plan: 1. Functional, cognitive and behavioral deficits secondary to severe TBI which require 3+ hours per day of interdisciplinary therapy in a comprehensive inpatient rehab setting. Physiatrist is providing close team supervision and 24 hour management of active medical problems listed below. Physiatrist and rehab team continue to assess barriers to discharge/monitor patient progress toward functional and medical goals.  Function:  Bathing Bathing position Bathing activity did not occur: Refused Position: Production manager parts bathed by patient: Right arm, Left arm, Chest, Front perineal area, Buttocks, Right upper leg, Left upper leg, Right lower leg, Left lower leg, Back Body parts bathed by helper: Right arm, Left arm, Front perineal area, Buttocks, Right upper leg,  Left upper leg, Right lower leg, Left lower leg, Back  Bathing assist Assist Level: Supervision or verbal cues (max  cues)      Upper Body Dressing/Undressing Upper body dressing   What is the patient wearing?: Pull over shirt/dress     Pull over shirt/dress - Perfomed by patient: Thread/unthread right sleeve, Thread/unthread left sleeve, Put head through opening, Pull shirt over trunk Pull over shirt/dress - Perfomed by helper: Thread/unthread right sleeve, Thread/unthread left sleeve, Put head through opening, Pull shirt over trunk        Upper body assist Assist Level: Supervision or verbal cues      Lower Body Dressing/Undressing Lower body dressing   What is the patient wearing?: Pants Underwear - Performed by patient: Pull underwear up/down   Pants- Performed by patient: Pull pants up/down Pants- Performed by helper: Pull pants up/down Non-skid slipper socks- Performed by patient: Don/doff right sock, Don/doff left sock Non-skid slipper socks- Performed by helper: Don/doff right sock, Don/doff left sock Socks - Performed by patient: Don/doff right sock, Don/doff left sock   Shoes - Performed by patient: Don/doff right shoe, Don/doff left shoe            Lower body assist Assist for lower body dressing: Supervision or verbal cues      Toileting Toileting   Toileting steps completed by patient: Adjust clothing prior to toileting, Adjust clothing after toileting Toileting steps completed by helper: Performs perineal hygiene Toileting Assistive Devices: Grab bar or rail  Toileting assist Assist level: Supervision or verbal  cues   Transfers Chair/bed transfer   Chair/bed transfer method: Ambulatory Chair/bed transfer assist level: Supervision or verbal cues Chair/bed transfer assistive device: Armrests     Locomotion Ambulation Ambulation activity did not occur: Safety/medical concerns   Max distance: >200 ft Assist level: Supervision or verbal cues    Wheelchair Wheelchair activity did not occur: N/A Type: Manual Max wheelchair distance: 110' Assist Level: Supervision or verbal cues  Cognition Comprehension Comprehension assist level: Understands basic 50 - 74% of the time/ requires cueing 25 - 49% of the time  Expression Expression assist level: Expresses basic 50 - 74% of the time/requires cueing 25 - 49% of the time. Needs to repeat parts of sentences.  Social Interaction Social Interaction assist level: Interacts appropriately 25 - 49% of time - Needs frequent redirection., Interacts appropriately with others with medication or extra time (anti-anxiety, antidepressant).  Problem Solving Problem solving assist level: Solves basic 50 - 74% of the time/requires cueing 25 - 49% of the time  Memory Memory assist level: Recognizes or recalls less than 25% of the time/requires cueing greater than 75% of the time   Medical Problem List and Plan: 1. 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  DVT Prophylaxis/Anticoagulation: SCDs. Dopplers negative 3. Pain Management: Hycet as needed 4. Mood/agitation related to traumatic brain injury:   -sleep/wake better in general  -continue risperdal at 2mg  qhs.  increased day time risperdal back to 1mg  BID  -celexa 40mg  qhs  -Klonopin:  Increased to 2 mg  TID   - depakote  Decreased to 500mg   TID on 2/7 given supratherapeutic level -continue nicotine patch  -librium 5mg  qhs   -haldol prn for severe agitation  -Propranolol 10 TID started 2/28,DC'd on 3/1 due to blood pressure.  -in low bed now   -Psychiatry consult placed. Still awaiting evaluation. 5. Neuropsych: This patient is not capable of  making decisions on her own behalf.             -poor judgment and awareness 6. Skin/Wound Care: wound clean and continues closing  -continue current dressing  -Wound slowly closing, appreciate WOC, cont dressing changes 7. Fluids/Electrolytes/Nutrition:   -potassium 4.3 on 3/2 8. Seizure prophylaxis. Keppra 500 mg twice a day  --decreased to 250mg  9. Dysphagia with decreased nutritional storage.   Advanced to D3 thins  10. 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   -decannulated   11. 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 Acute blood loss anemia  hgb 11.1 on 3/2 13. Polysubstance abuse. Provide counseling when appropriate as outpt 14. Hypotension  Asymptomatic 3/2  Likely medication induced. 14. Constipation  Bowel regimen increased on 3/1.   Improving  LOS (Days) 59 A FACE TO FACE EVALUATION WAS PERFORMED  Tinleigh Whitmire Lorie Phenix, MD 12/07/2016 9:53 AM

## 2016-12-07 NOTE — Progress Notes (Signed)
Speech Language Pathology Discharge Summary  Patient Details  Name: Jacqueline [name] MRN: [id number] Date of Birth: 1966/07/30  Patient has met 5 of 8 long term goals.  Patient to discharge at [facility] Max;Total level.   Reasons goals not met: Patient requires overall Total A for orientation, recall, and intellectual awareness    Clinical Impression/Discharge Summary: Patient has made minimal gains and has met 5 of 8 LTG's this admission. Currently, patient is consuming regular textures and thin liquids without overt s/s of aspiration and is Mod I for use of swallowing compensatory strategies. Patient demonstrates behaviors consistent with a Rancho Level V-VI and continues to require total A for recall of new information, intellectual awareness and orientation. Patient also continues to demonstrate perseveration of topics, language of confusion and confabulation which can be difficult to redirect at times. Patient can problem solve and sustain attention to basic and familiar tasks with overall Mod A cues. Patient will require 24 hour supervision at discharge but her care partner is unavailable to provide the necessary physical and cognitive assistance at discharge.   Care Partner:  Caregiver Able to Provide Assistance: No  Type of Caregiver Assistance: Physical;Cognitive  Recommendation:  24 hour supervision/assistance;Skilled Nursing facility  Rationale for SLP Follow Up: Maximize cognitive function and independence;Reduce caregiver burden   Equipment: N/A   Reasons for discharge: Lack of progress towards goals   Patient/Family Agrees with Progress Made and Goals Achieved: No      Shadi Larner 12/07/2016, 6:56 AM

## 2016-12-08 DIAGNOSIS — T8189XA Other complications of procedures, not elsewhere classified, initial encounter: Secondary | ICD-10-CM

## 2016-12-08 DIAGNOSIS — T8189XD Other complications of procedures, not elsewhere classified, subsequent encounter: Secondary | ICD-10-CM

## 2016-12-08 MED ORDER — SULFAMETHOXAZOLE-TRIMETHOPRIM 800-160 MG PO TABS
1.0000 | ORAL_TABLET | Freq: Two times a day (BID) | ORAL | Status: DC
  Administered 2016-12-08 – 2016-12-12 (×8): 1 via ORAL
  Filled 2016-12-08 (×9): qty 1

## 2016-12-08 NOTE — Progress Notes (Signed)
[place] PHYSICAL MEDICINE & REHABILITATION     PROGRESS NOTE    Subjective/Complaints: Pt seen laying in bed this AM.  Sleep chart reviewed, poor sleep.  Confirmed with nursing.  Sleep comfortably this AM.   ROS: Limited due cognitive/behavioral   Objective: Vital Signs: Blood pressure (!) 92/56, pulse 67, temperature 98.3 F (36.8 C), temperature source Oral, resp. rate 18, weight 81 kg (178 lb 9.6 oz), SpO2 98 %. No results found.  Recent Labs  12/07/16 0726  WBC 6.3  HGB 11.1*  HCT 34.3*  PLT 274    Recent Labs  12/07/16 0726  NA 139  K 4.3  CL 105  GLUCOSE 73  BUN 5*  CREATININE 0.67  CALCIUM 8.5*   CBG (last 3)  No results for input(s): GLUCAP in the last 72 hours.  Wt Readings from Last 3 Encounters:  12/08/16 81 kg (178 lb 9.6 oz)  10/09/16 71.4 kg (157 lb 4.8 oz)  08/03/16 93 kg (205 lb)    Physical Exam:  Gen: NAD. Vital signs reviewed. Card: RRR. No JVD. Respiratory: CTA. Unlabored. GI: Soft, non-distended.  Musc: No edema, no tenderness Neurological: remains confused Motor: spontaneously moving all 4 ext.   Skin: Abd wound with increased drainage and hyperganulation  Psych: confused. tangential  Assessment/Plan: 1. Functional, cognitive and behavioral deficits secondary to severe TBI which require 3+ hours per day of interdisciplinary therapy in a comprehensive inpatient rehab setting. Physiatrist is providing close team supervision and 24 hour management of active medical problems listed below. Physiatrist and rehab team continue to assess barriers to discharge/monitor patient progress toward functional and medical goals.  Function:  Bathing Bathing position Bathing activity did not occur: Refused Position: Production manager parts bathed by patient: Right arm, Left arm, Chest, Front perineal area, Buttocks, Right upper leg, Left upper leg, Right lower leg, Left lower leg, Back Body parts bathed by helper: Right arm, Left arm,  Front perineal area, Buttocks, Right upper leg, Left upper leg, Right lower leg, Left lower leg, Back  Bathing assist Assist Level: Supervision or verbal cues (max  cues)      Upper Body Dressing/Undressing Upper body dressing   What is the patient wearing?: Pull over shirt/dress     Pull over shirt/dress - Perfomed by patient: Thread/unthread right sleeve, Thread/unthread left sleeve, Put head through opening, Pull shirt over trunk Pull over shirt/dress - Perfomed by helper: Thread/unthread right sleeve, Thread/unthread left sleeve, Put head through opening, Pull shirt over trunk        Upper body assist Assist Level: Supervision or verbal cues      Lower Body Dressing/Undressing Lower body dressing   What is the patient wearing?: Pants Underwear - Performed by patient: Pull underwear up/down   Pants- Performed by patient: Pull pants up/down Pants- Performed by helper: Pull pants up/down Non-skid slipper socks- Performed by patient: Don/doff right sock, Don/doff left sock Non-skid slipper socks- Performed by helper: Don/doff right sock, Don/doff left sock Socks - Performed by patient: Don/doff right sock, Don/doff left sock   Shoes - Performed by patient: Don/doff right shoe, Don/doff left shoe            Lower body assist Assist for lower body dressing: Supervision or verbal cues      Toileting Toileting   Toileting steps completed by patient: Adjust clothing prior to toileting, Performs perineal hygiene, Adjust clothing after toileting Toileting steps completed by helper: Performs perineal hygiene Toileting Assistive Devices: Grab bar  or rail  Toileting assist Assist level: Supervision or verbal cues   Transfers Chair/bed transfer   Chair/bed transfer method: Ambulatory Chair/bed transfer assist level: Supervision or verbal cues Chair/bed transfer assistive device: Armrests     Locomotion Ambulation Ambulation activity did not occur: Safety/medical concerns    Max distance: >200 ft Assist level: Supervision or verbal cues   Wheelchair Wheelchair activity did not occur: N/A Type: Manual Max wheelchair distance: 110' Assist Level: Supervision or verbal cues  Cognition Comprehension Comprehension assist level: Understands basic 50 - 74% of the time/ requires cueing 25 - 49% of the time  Expression Expression assist level: Expresses basic 50 - 74% of the time/requires cueing 25 - 49% of the time. Needs to repeat parts of sentences.  Social Interaction Social Interaction assist level: Interacts appropriately 25 - 49% of time - Needs frequent redirection.  Problem Solving Problem solving assist level: Solves basic 50 - 74% of the time/requires cueing 25 - 49% of the time  Memory Memory assist level: Recognizes or recalls less than 25% of the time/requires cueing greater than 75% of the time   Medical Problem List and Plan: 1. 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  DVT Prophylaxis/Anticoagulation: SCDs. Dopplers negative 3. Pain Management: Hycet as needed 4. Mood/agitation related to traumatic brain injury:   -sleep/wake better in general  -continue risperdal at 2mg  qhs.  increased day time risperdal back to 1mg  BID  -celexa 40mg  qhs  -Klonopin:  Increased to 2 mg  TID   - depakote  Decreased to 500mg   TID on 2/7 given supratherapeutic level -continue nicotine patch  -librium 5mg  qhs   -haldol prn for severe agitation  -Propranolol 10 TID started 2/28,DC'd on 3/1 due to blood pressure.  -in low bed now   -Psychiatry consult placed. Remains pending. 5. Neuropsych: This patient is not capable of making decisions on her own behalf.              -poor judgment and awareness 6. Skin/Wound Care: wound clean and continues closing  -continue current dressing  -Wound slowly closing, appreciate WOC, cont dressing changes  Bactrim started 3/3 7. Fluids/Electrolytes/Nutrition:   -potassium 4.3 on 3/2 8. Seizure prophylaxis. Keppra 500 mg twice a day  --decreased to 250mg  9. Dysphagia with decreased nutritional storage.   Advanced to D3 thins  10. 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   -decannulated   11. 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 Acute blood loss anemia  hgb 11.1 on 3/2 13. Polysubstance abuse. Provide counseling when appropriate as outpt 14. Hypotension  Asymptomatic 3/2  Likely medication induced. 14. Constipation  Bowel regimen increased on 3/1.   Improving  LOS (Days) 60 A FACE TO FACE EVALUATION WAS PERFORMED  Lacoya Wilbanks Lorie Phenix, MD 12/08/2016 8:49 PM

## 2016-12-08 NOTE — Progress Notes (Signed)
12/08/16 1122 nursing Patient has been sleepy this morning but able to take meds and changed dressing on her abdomen. Bed alarm on and continued to monitor.

## 2016-12-09 NOTE — Progress Notes (Signed)
[place] PHYSICAL MEDICINE & REHABILITATION     PROGRESS NOTE    Subjective/Complaints: Pt seen laying in bed this AM. Per nursing, pt restless overnight, but better than previous night. This AM, she perseverates on me being "Juanda Crumble" ?boyfriend and threatens to break up with me.    ROS: Limited due cognitive/behavioral   Objective: Vital Signs: Blood pressure 99/71, pulse 72, temperature 98.6 F (37 C), temperature source Oral, resp. rate 18, weight 80 kg (176 lb 5.9 oz), SpO2 94 %. No results found.  Recent Labs  12/07/16 0726  WBC 6.3  HGB 11.1*  HCT 34.3*  PLT 274    Recent Labs  12/07/16 0726  NA 139  K 4.3  CL 105  GLUCOSE 73  BUN 5*  CREATININE 0.67  CALCIUM 8.5*   CBG (last 3)  No results for input(s): GLUCAP in the last 72 hours.  Wt Readings from Last 3 Encounters:  12/09/16 80 kg (176 lb 5.9 oz)  10/09/16 71.4 kg (157 lb 4.8 oz)  08/03/16 93 kg (205 lb)    Physical Exam:  Gen: NAD. Vital signs reviewed. Card: RRR. No JVD. Respiratory: CTA. Unlabored. GI: Soft, non-distended.  Musc: No edema, no tenderness Neurological: remains confused Motor: spontaneously moving all 4 ext.   Skin: Abd wound with increased thick drainage and hyperganulation  Psych: confused. Tangential, perseverative  Assessment/Plan: 1. Functional, cognitive and behavioral deficits secondary to severe TBI which require 3+ hours per day of interdisciplinary therapy in a comprehensive inpatient rehab setting. Physiatrist is providing close team supervision and 24 hour management of active medical problems listed below. Physiatrist and rehab team continue to assess barriers to discharge/monitor patient progress toward functional and medical goals.  Function:  Bathing Bathing position Bathing activity did not occur: Refused Position: Production manager parts bathed by patient: Right arm, Left arm, Chest, Front perineal area, Buttocks, Right upper leg, Left upper  leg, Right lower leg, Left lower leg, Back Body parts bathed by helper: Right arm, Left arm, Front perineal area, Buttocks, Right upper leg, Left upper leg, Right lower leg, Left lower leg, Back  Bathing assist Assist Level: Supervision or verbal cues (max  cues)      Upper Body Dressing/Undressing Upper body dressing   What is the patient wearing?: Pull over shirt/dress     Pull over shirt/dress - Perfomed by patient: Thread/unthread right sleeve, Thread/unthread left sleeve, Put head through opening, Pull shirt over trunk Pull over shirt/dress - Perfomed by helper: Thread/unthread right sleeve, Thread/unthread left sleeve, Put head through opening, Pull shirt over trunk        Upper body assist Assist Level: Supervision or verbal cues      Lower Body Dressing/Undressing Lower body dressing   What is the patient wearing?: Pants Underwear - Performed by patient: Pull underwear up/down   Pants- Performed by patient: Pull pants up/down Pants- Performed by helper: Pull pants up/down Non-skid slipper socks- Performed by patient: Don/doff right sock, Don/doff left sock Non-skid slipper socks- Performed by helper: Don/doff right sock, Don/doff left sock Socks - Performed by patient: Don/doff right sock, Don/doff left sock   Shoes - Performed by patient: Don/doff right shoe, Don/doff left shoe            Lower body assist Assist for lower body dressing: Supervision or verbal cues      Toileting Toileting   Toileting steps completed by patient: Adjust clothing prior to toileting, Performs perineal hygiene, Adjust clothing after toileting  Toileting steps completed by helper: Performs perineal hygiene Toileting Assistive Devices: Grab bar or rail  Toileting assist Assist level: Supervision or verbal cues   Transfers Chair/bed transfer   Chair/bed transfer method: Ambulatory Chair/bed transfer assist level: Supervision or verbal cues Chair/bed transfer assistive device:  Armrests     Locomotion Ambulation Ambulation activity did not occur: Safety/medical concerns   Max distance: >200 ft Assist level: Supervision or verbal cues   Wheelchair Wheelchair activity did not occur: N/A Type: Manual Max wheelchair distance: 110' Assist Level: Supervision or verbal cues  Cognition Comprehension Comprehension assist level: Understands basic 50 - 74% of the time/ requires cueing 25 - 49% of the time  Expression Expression assist level: Expresses basic 50 - 74% of the time/requires cueing 25 - 49% of the time. Needs to repeat parts of sentences.  Social Interaction Social Interaction assist level: Interacts appropriately 25 - 49% of time - Needs frequent redirection.  Problem Solving Problem solving assist level: Solves basic 50 - 74% of the time/requires cueing 25 - 49% of the time  Memory Memory assist level: Recognizes or recalls less than 25% of the time/requires cueing greater than 75% of the time   Medical Problem List and Plan: 1. 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  DVT Prophylaxis/Anticoagulation: SCDs. Dopplers negative 3. Pain Management: Hycet as needed 4. Mood/agitation related to traumatic brain injury:   -sleep/wake better in general  -continue risperdal at 2mg  qhs.  increased day time risperdal back to 1mg  BID  -celexa 40mg  qhs  -Klonopin:  Increased to 2 mg  TID   - depakote  Decreased to 500mg   TID on 2/7 given supratherapeutic level -continue nicotine patch  -librium 5mg  qhs   -haldol prn for severe agitation  -Propranolol 10 TID started 2/28,DC'd on 3/1 due to blood pressure.  -in low bed now   -Psychiatry consult placed.  Remains pending to date. 5. Neuropsych: This patient is not capable of making decisions on her own behalf.             -poor judgment and awareness 6. Skin/Wound Care: wound clean and continues closing  -continue current dressing  -Wound slowly closing, appreciate WOC, cont dressing changes  Bactrim started 3/3 7. Fluids/Electrolytes/Nutrition:   -potassium 4.3 on 3/2 8. Seizure prophylaxis. Keppra 500 mg twice a day  --decreased to 250mg  9. Dysphagia with decreased nutritional storage.   Advanced to D3 thins  10. 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   -decannulated   11. 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 Acute blood loss anemia  hgb 11.1 on 3/2 13. Polysubstance abuse. Provide counseling when appropriate as outpt 14. Hypotension  Asymptomatic 3/2  Likely medication induced. 14. Constipation  Bowel regimen increased on 3/1.   Improving  LOS (Days) 61 A FACE TO FACE EVALUATION WAS PERFORMED  [name], MD 12/09/2016 8:49 AM

## 2016-12-09 NOTE — Progress Notes (Signed)
Drainage from midline abdominal site was light-green in appearance and had the consistency of mucous; site cleaned and redressed.  Annita Brod, RN

## 2016-12-10 MED ORDER — MICONAZOLE NITRATE 2 % VA CREA
1.0000 | TOPICAL_CREAM | Freq: Every day | VAGINAL | Status: DC
  Administered 2016-12-10: 1 via VAGINAL
  Filled 2016-12-10: qty 45

## 2016-12-10 MED ORDER — MUSCLE RUB 10-15 % EX CREA
TOPICAL_CREAM | Freq: Four times a day (QID) | CUTANEOUS | Status: DC | PRN
  Administered 2016-12-10: 13:00:00 via TOPICAL
  Filled 2016-12-10: qty 85
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
## 2016-12-10 NOTE — Progress Notes (Signed)
Patient maybe slept for about 2 hrs last night. She continuous got up, was in her room looking for her shoes, brushing her teeth, coming out to the nurses station, trying to use the phone, picking up things, touching the ice machine & messing with the cups, going in the cabinets, refrigerator, walking the halls, etc. Patient was redirected many times, at most times did not take redirection. She stated that she was going home, going to her car, wanting to call her mother, stated that she had to leave, she has a court date in the morning, etc. She c/o a toothache multiple times. Pain medication was given when available. Oragel was also given. Patient took off her clothes a few times & had to be redirected from going into other patients rooms. No violent behavior noted. Offered snacks, drinks and toiletting multiple times. Will pass on to the the oncoming nurse. No acute distress noted at this time

## 2016-12-11 MED ORDER — FLUCONAZOLE 100 MG PO TABS
150.0000 mg | ORAL_TABLET | Freq: Once | ORAL | Status: AC
  Administered 2016-12-11: 150 mg via ORAL
  Filled 2016-12-11: qty 2

## 2016-12-11 MED ORDER — LORAZEPAM 2 MG/ML IJ SOLN
1.0000 mg | Freq: Four times a day (QID) | INTRAMUSCULAR | Status: DC | PRN
  Administered 2016-12-11: 1 mg via INTRAMUSCULAR
  Filled 2016-12-11: qty 1

## 2016-12-11 MED ORDER — HALOPERIDOL LACTATE 5 MG/ML IJ SOLN
1.0000 mg | Freq: Four times a day (QID) | INTRAMUSCULAR | Status: DC | PRN
  Administered 2016-12-12: 1 mg via INTRAMUSCULAR
  Filled 2016-12-11: qty 1

## 2016-12-11 MED ORDER — RISPERIDONE 2 MG PO TBDP
3.0000 mg | ORAL_TABLET | Freq: Every day | ORAL | Status: DC
  Administered 2016-12-11: 20:00:00 3 mg via ORAL
  Filled 2016-12-11: qty 1

## 2016-12-11 NOTE — Progress Notes (Signed)
Patient ate bites of breakfast this morning but fell asleep during meal, therefore RN encouraged patient to spit out bite.  Assisted patient to bed, slightly unsteady on feet.  Patient now sleeping.  Changes made to medication regimen.  Ativan ordered and Haldol discontinued.  In the past, Ativan has been known to have the opposite effect on the patient causing her agitation to increase.  Brita Romp, RN

## 2016-12-11 NOTE — Progress Notes (Signed)
Gave patients medications early due to patient escalation, restlessness, and attempt to leave unit.

## 2016-12-11 NOTE — Progress Notes (Signed)
Patient started demanding to be allowed to leave, stated "I  need to go home to check on the babies". She repeatedly attempted to leave the unit and staff attempted to redirect the patient and explain to her that she could not leave. Patient became increasingly agitated to the point it required 5 staff members to prevent her leaving and return her to her room. Patient was given HALDOL, 2mg  IM and eventually began to show less agitation. At [place] she was resting quietly in her room.
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
## 2016-12-11 NOTE — Progress Notes (Signed)
Patient slept for 3-4 hrs last night( 3 hours continuous, otherwise just cat naps). She continuously verbalized she was leaving, walked out of her room, wandered halls, redirection does not make a difference, referring to pt safety and needing help does not change her behavior, orienting her to her safety equi[pment does not change or improve her behavior. Pt states her mom is waiting for her downstairs, pt oriented to person, does not remember where she is or why.  Pt coming out to the nurses station, trying to use the phone, picking up things, touching the ice machine & messing with the cups, going in the cabinets, refrigerator, walking the halls, etc. Patient was redirected many times, at most times did not take redirection. She stated that she was going home, going to her car, wanting to call her mother, stated that she had to leave, she has a court date in the morning, etc. She c/o a toothache multiple times. Pain medication was given when available. Oragel was also given. Haldol given for escalation of behavior and times when pt would not sit in her chair or would not stay in bed.  No violent behavior, however, pt threatens violence when nurse is in her way.   Offered snacks, drinks and toiletting multiple times. Will pass on to the the oncoming nurse. No acute distress noted at this time

## 2016-12-11 NOTE — Progress Notes (Signed)
Patient sitting at nurses station unaffected by IM Ativan continuously asking for "a beer" and adamant that we can give it to her.  Unable to redirect patient.  PRN clonazepam given.  Attempting to redirect patient verbally.  Patient still attempting to leave the unit saying she is leaving because we won't give her a beer.  Patient stumbling as assisted to ambulate back to recliner nearly falling backwards.  Brita Romp, RN
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
## 2016-12-11 NOTE — Progress Notes (Signed)
Patient attempting to leave unit through emergency exits.  Unable to redirect.  Called PA for advice since PRN Ativan is known for increasing agitation in her instead of calming her down.  Ordered to give anyway.  Ativan 1mg  administered in right deltoid.  Patient forcefully assisted back into recliner chair with quick release belt in place.  Patient at nurses station still continuing to perseverate on leaving and calling 911.  Brita Romp, RN

## 2016-12-11 NOTE — Progress Notes (Signed)
Patient woke up this morning around 10 am.  She sat herself at the nurses station.  She mentioned leaving a few times but was able to be redirected.  Patient still thinks she is working here.  She complains of tooth pain and vaginal itching.  Marlowe Sh[place], PA notified of vaginal itching and diflucan ordered.  Norco given for toothache.  Patient ate her lunch and appears drowsy so assisted patient to recliner to rest.  Brita Romp, RN

## 2016-12-11 NOTE — Progress Notes (Signed)
Social Work Patient ID: Phillis Haggis, female   DOB: October 05, 1966, 51 y.o.   MRN: [id number]   Have received SNF bed offer from [facility] and [facility] in [place], [place].  They would like to admit tomorrow.  Team, mother and brother all aware and agreeable with transfer tomorrow, however, family very reluctant to sign any needed paperwork.  Mother agreeable that I have the paperwork faxed to my office and she will consider signing "as long as I'm not responsible for anything."  Will plan for tomorrow d/c with pt to be transported via ambulance for safety.  Saxon Crosby, LCSW
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
## 2016-12-11 NOTE — Discharge Summary (Signed)
Discharge summary job # [id number]

## 2016-12-12 MED ORDER — RISPERIDONE 1 MG PO TABS: 1.0000 mg | ORAL_TABLET | Freq: Two times a day (BID) | ORAL | 0 refills | Status: DC

## 2016-12-12 MED ORDER — LORAZEPAM 1 MG PO TABS: 1.0000 mg | ORAL_TABLET | Freq: Four times a day (QID) | ORAL | 0 refills | Status: DC | PRN

## 2016-12-12 MED ORDER — VALPROIC ACID 250 MG PO CAPS: 500.0000 mg | ORAL_CAPSULE | Freq: Three times a day (TID) | ORAL | 0 refills | Status: DC

## 2016-12-12 MED ORDER — CLONAZEPAM 2 MG PO TABS: 2.0000 mg | ORAL_TABLET | Freq: Three times a day (TID) | ORAL | 0 refills | Status: DC

## 2016-12-12 MED ORDER — LORAZEPAM 1 MG PO TABS: 1.0000 mg | ORAL_TABLET | Freq: Four times a day (QID) | ORAL | Status: DC | PRN

## 2016-12-12 MED ORDER — CHLORDIAZEPOXIDE HCL 5 MG PO CAPS: 5.0000 mg | ORAL_CAPSULE | Freq: Every day | ORAL | 0 refills | Status: DC

## 2016-12-12 MED ORDER — RISPERIDONE 3 MG PO TBDP: 3.0000 mg | ORAL_TABLET | Freq: Every day | ORAL | 0 refills | Status: DC

## 2016-12-12 MED ORDER — HYDROCODONE-ACETAMINOPHEN 7.5-325 MG PO TABS: 1.0000 | ORAL_TABLET | Freq: Four times a day (QID) | ORAL | 0 refills | Status: DC | PRN
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
## 2016-12-12 NOTE — Plan of Care (Signed)
Problem: RH SKIN INTEGRITY Goal: RH STG ABLE TO PERFORM INCISION/WOUND CARE W/ASSISTANCE STG Able To Perform Incision/Wound Care With max Assistance.  Outcome: Not Met (add Reason) Total assist from staff- pt refusing dressing chnage  Problem: RH SAFETY Goal: RH STG ADHERE TO SAFETY PRECAUTIONS W/ASSISTANCE/DEVICE STG Adhere to Safety Precautions With mod  Assistance/Device.  Outcome: Not Met (add Reason) Unable to follow safety precautions with mod assist. Total assist and unable to recall safety plan  Problem: RH COGNITION-NURSING Goal: RH STG USES MEMORY AIDS/STRATEGIES W/ASSIST TO PROBLEM SOLVE STG Uses Memory Aids/Strategies With  Mod Assistance to Problem Solve.  Outcome: Not Met (add Reason) Unable to use memory aids with mod assist. Needs total assist from staff. Unable to retain/recall information
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
## 2016-12-12 NOTE — Progress Notes (Signed)
Pt discharged to SNF at 1225 via ambulance. Report called to SNF and pts belongings with patient.

## 2016-12-13 NOTE — Progress Notes (Signed)
Social Work  Discharge Note  The overall goal for the admission was met for:   Discharge location: Yes - d/c'd to SNF as was plan originally on CIR admit.  Length of Stay: No - much longer LOS than anticipated due to behavior, care needs, changing LOCs being pursued and family dynamics.  LOS = 64 days  Discharge activity level: Yes - supervision  Home/community participation: Yes  Services provided included: MD, RD, PT, OT, SLP, RN, TR, Pharmacy, Neuropsych and SW  Financial Services: Medicaid  Follow-up services arranged: Other: SNF at [facility] in [place]  Comments (or additional information):  Patient/Family verbalized understanding of follow-up arrangements: Yes  Individual responsible for coordination of the follow-up plan: None - mother and family declined any responsibility for pt. [place] ([place] of Care Management) had to complete SNF admission paperwork and sign as "responsible person".  This Education officer, museum completed paperwork for incompetency and guardianship to be assigned (likely to DSS).  This was pending at time of pt's d/c.  Confirmed correct DME delivered: NA    Elodie Panameno

## 2018-05-14 IMAGING — CT CT HEAD W/O CM
3 of 4 series · 13 of 47 positions shown, 15 images · non-contrast
Comparison: 09/11/2016

CLINICAL DATA: Trauma.  Hemorrhage.

EXAM:
CT HEAD WITHOUT CONTRAST
TECHNIQUE: Contiguous axial images were obtained from the base of the skull
through the vertex without intravenous contrast.

[Series 2: head without · axial · non-contrast · 0.49mm/px · z∈[-173,-58]mm · 7 of 31 slices shown, 9 images]
[im 4/31  brain]
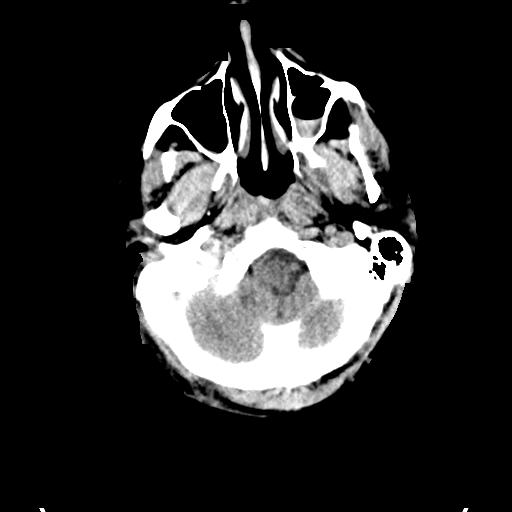
[im 4/31  bone]
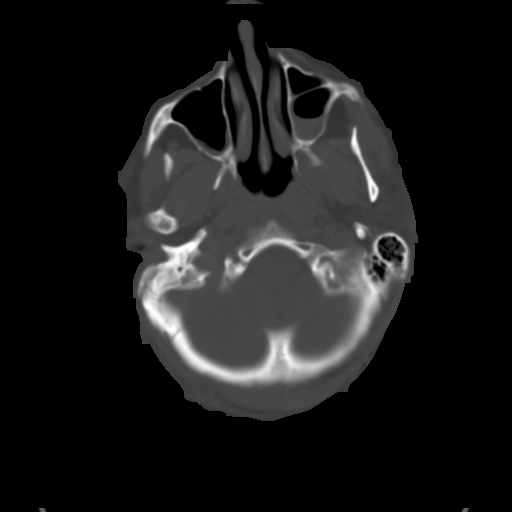
[im 8/31  brain]
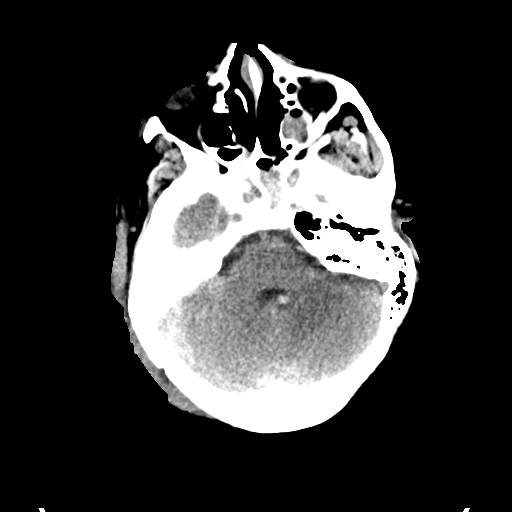
[im 12/31  brain]
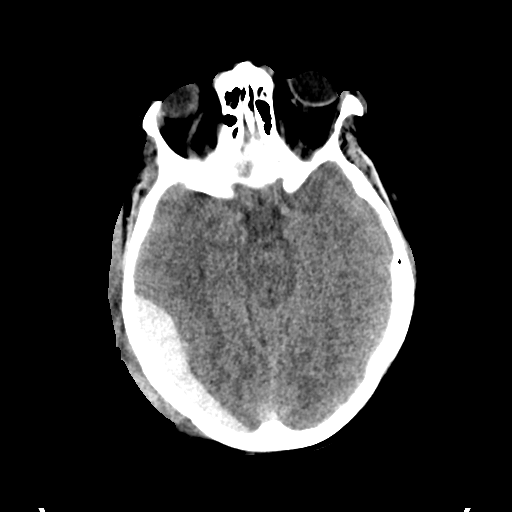
[im 16/31  brain]
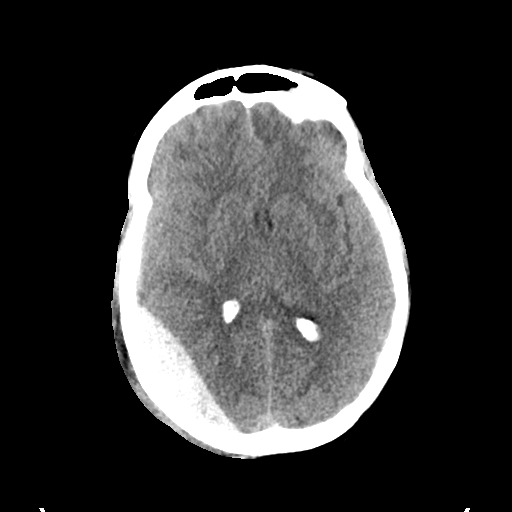
[im 19/31  brain]
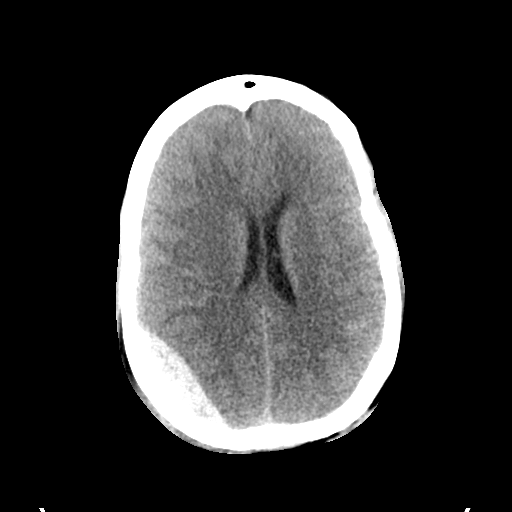
[im 19/31  bone]
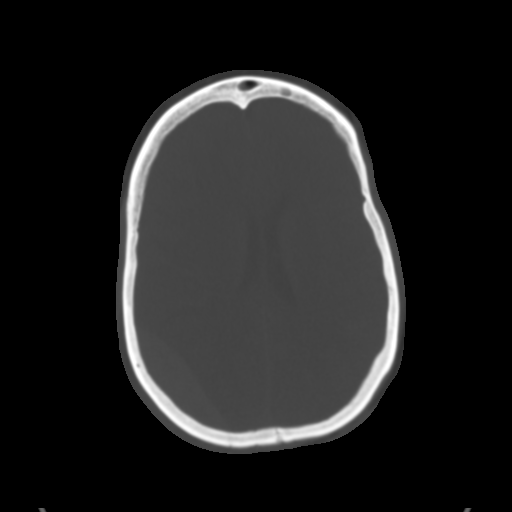
[im 23/31  brain]
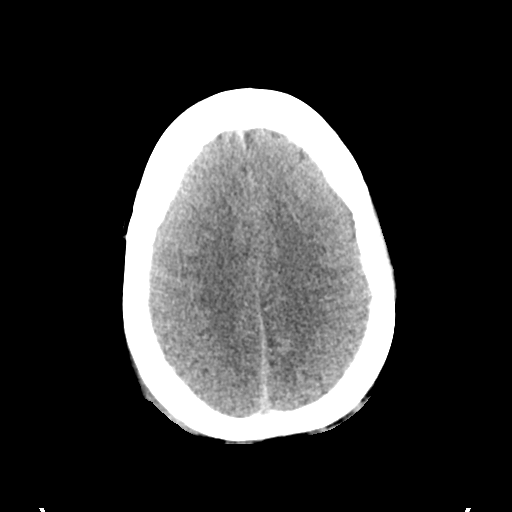
[im 27/31  brain]
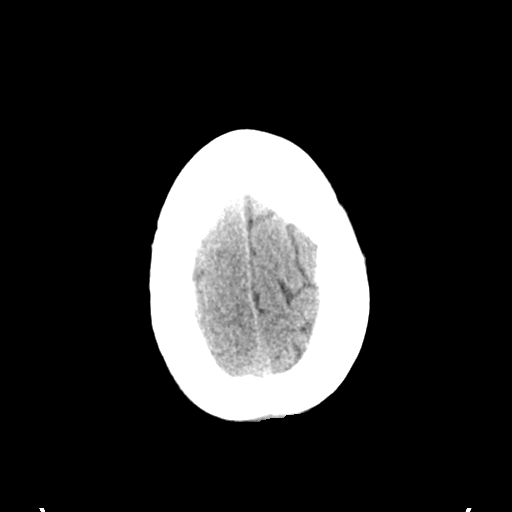

[Series 4: head without cor · coronal · non-contrast · 0.30mm/px · 3 of 67 slices shown]
[im 23/67  brain]
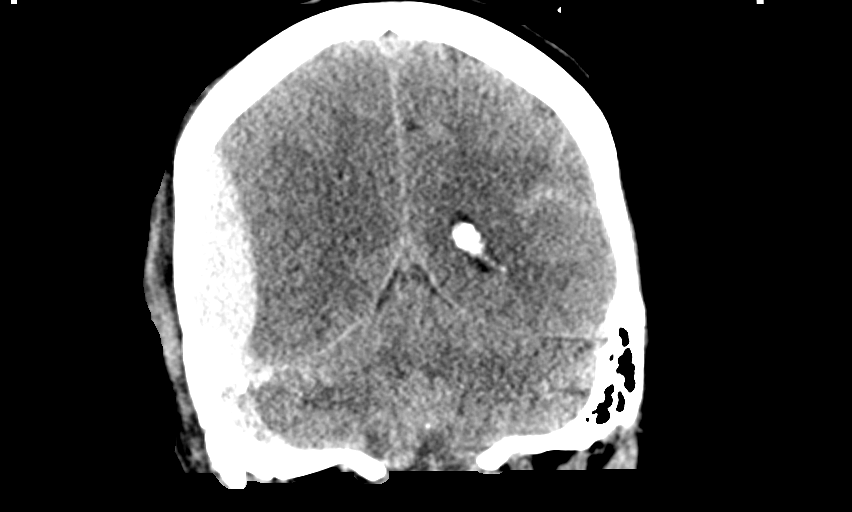
[im 30/67  brain]
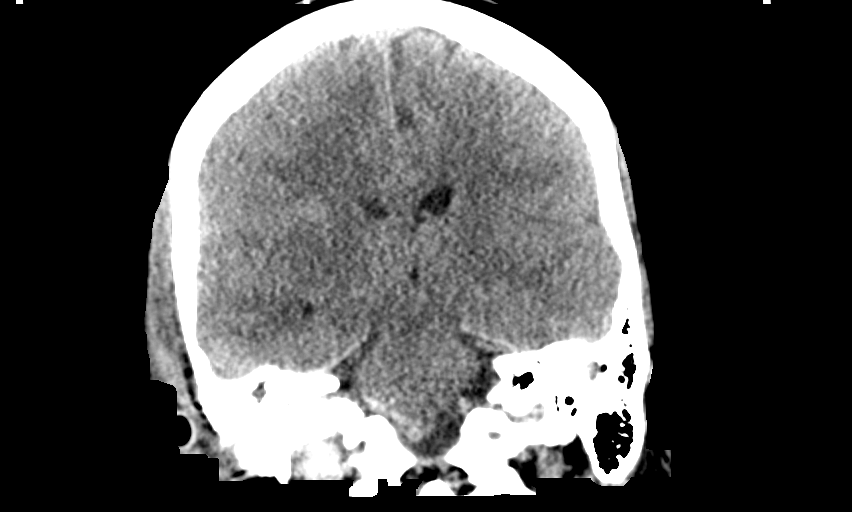
[im 37/67  brain]
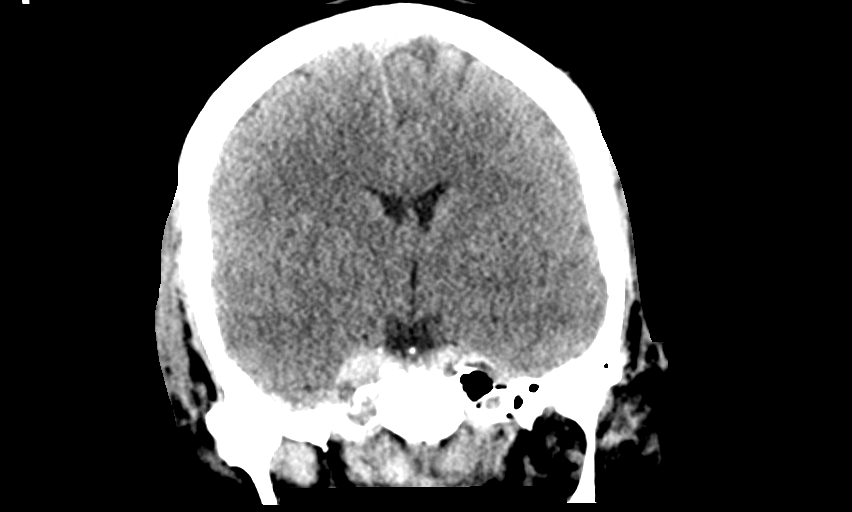

[Series 5: head without sag · sagittal · non-contrast · 0.30mm/px · 3 of 55 slices shown]
[im 19/55  brain]
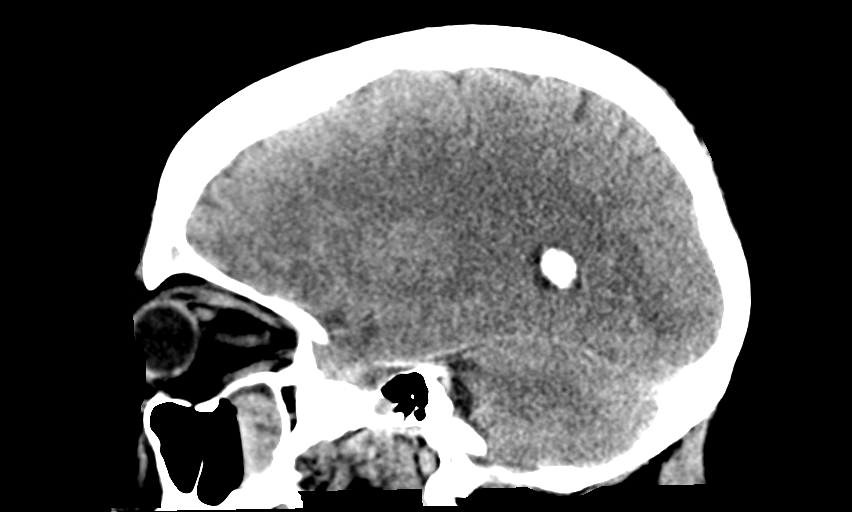
[im 28/55  brain]
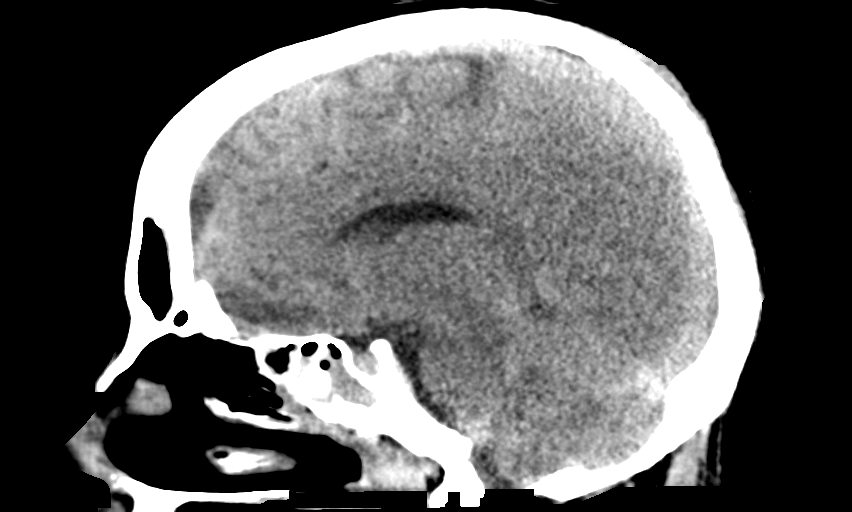
[im 37/55  brain]
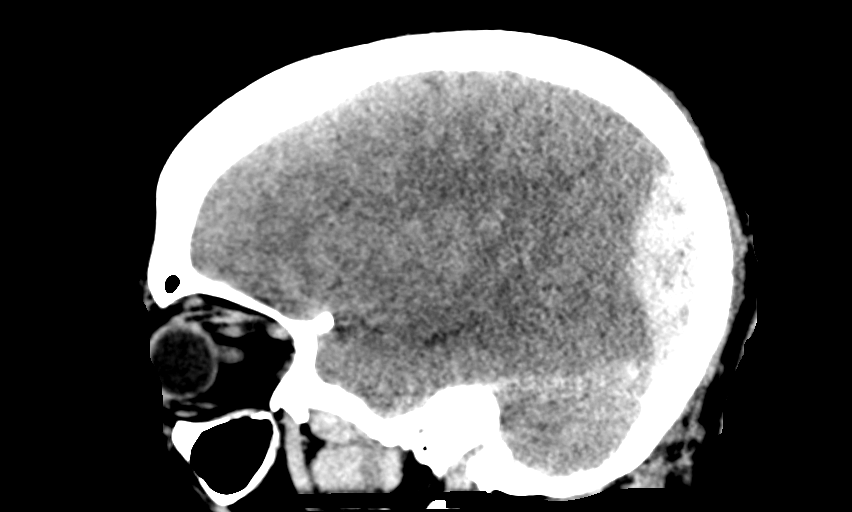

[13 of 47 positions shown; findings below may reference images not displayed]

FINDINGS: Brain: The large extra-axial hematoma, likely epidural hematoma
again noted over the right posterior temporal and occipital lobe,
measuring 18 mm in thickness compared with 9 mm previously.
Underlying mass effect. No intraparenchymal hemorrhage. No
hydrocephalus.

Vascular: No hyperdense vessel or unexpected calcification.

Skull: Right temporal bone fracture again noted, stable.

Sinuses/Orbits: Fluid noted within the mastoid air cells with
fracture line passing through the right mastoid air cells. Fluid
also present in the left mastoid air cells. Air-fluid levels noted
throughout the paranasal sinuses, including left frontal sinus. This
is stable.

Other: None
IMPRESSION: Essentially stable large right posterior temporal/ occipital
epidural hematoma. Stable mildly displaced right temporal bone
fracture involving the right mastoid air cells and middle ear
structures.

## 2018-05-18 IMAGING — CR DG CHEST 1V PORT
1 series · 1 of 1 positions shown · non-contrast
Comparison: Prior radiograph from 09/14/2016.

CLINICAL DATA: Initial evaluation for respiratory failure.
Endotracheal tube placement.

EXAM:
PORTABLE CHEST 1 VIEW

[AP]
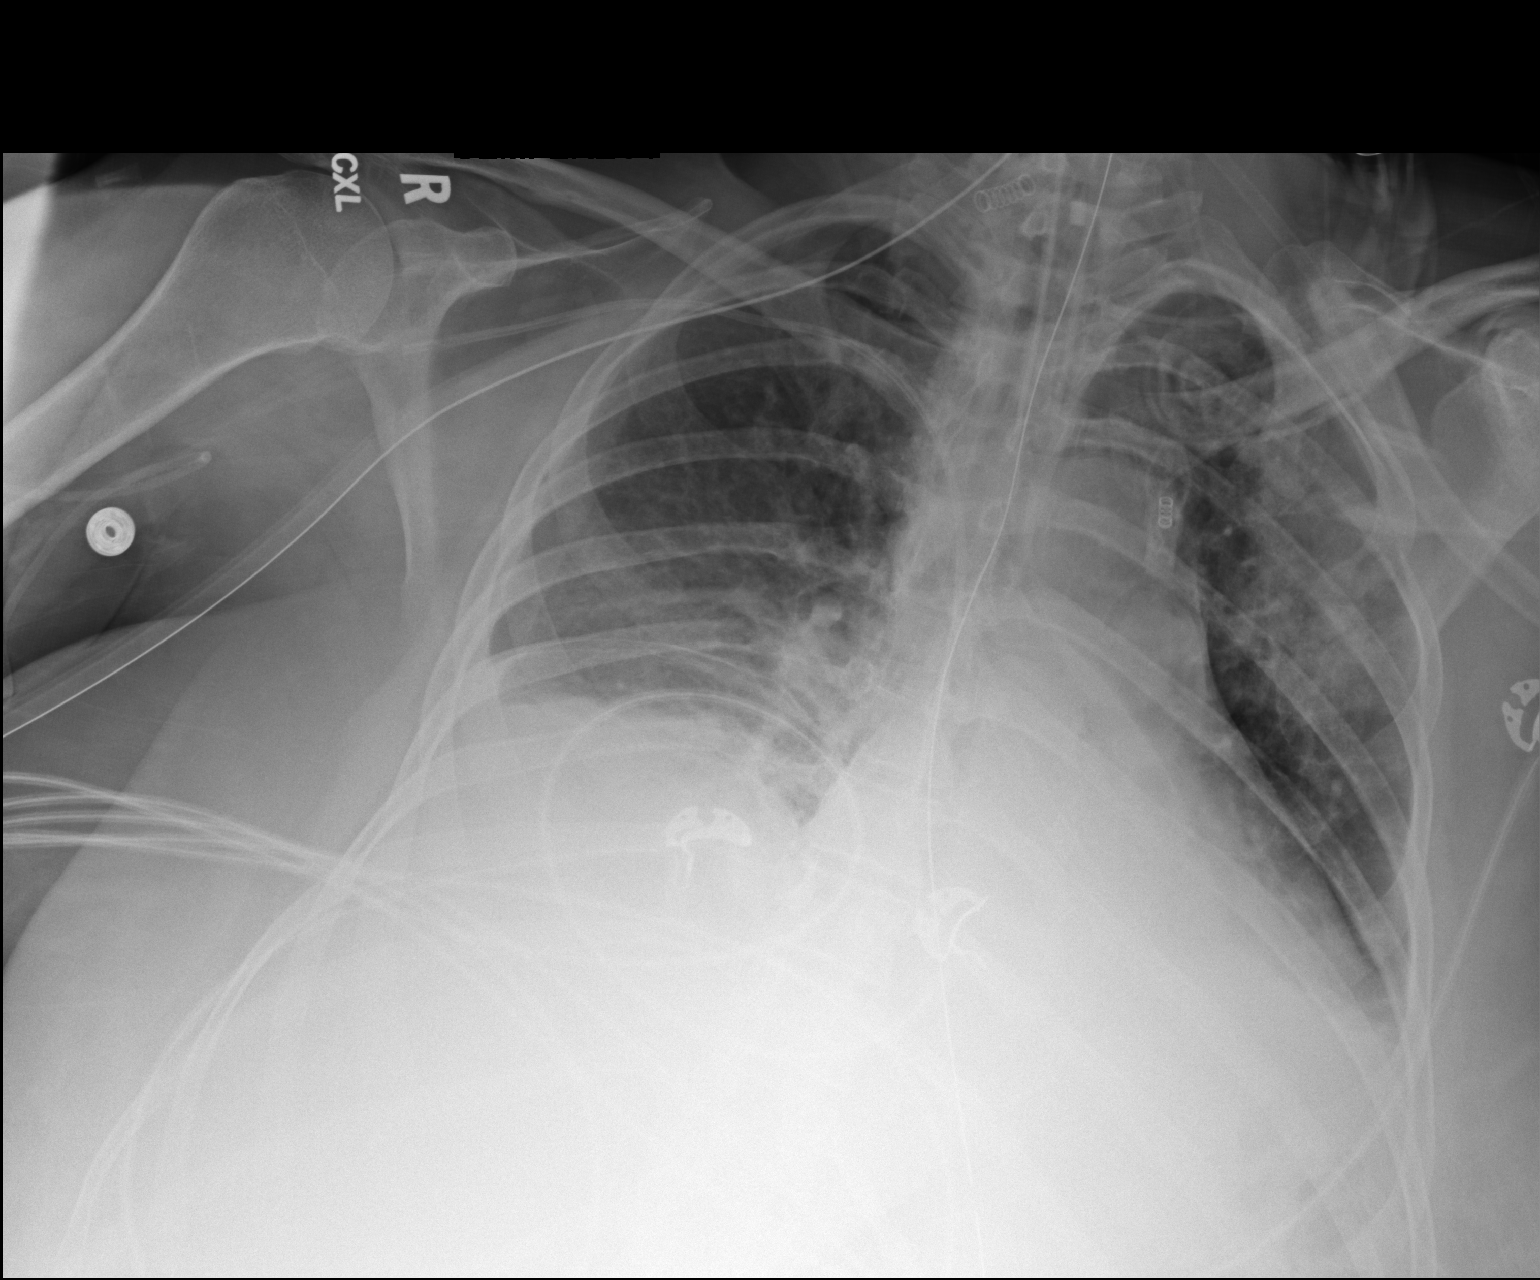

[1 of 1 positions shown; findings below may reference images not displayed]

FINDINGS: Patient remains intubated with the tip of the endotracheal tube
position 1.3 cm above the carina. Enteric tube courses in the the
abdomen. Side hole overlies the stomach. Right subclavian approach
central venous catheter remains in place with tip overlying the
cavoatrial junction. Cardiac and mediastinal silhouettes are stable.

Lungs are hypoinflated. Worsened bibasilar opacities, which may
reflect atelectasis or infiltrates. There is new patchy opacity
within the left upper lobe, concerning for pneumonia. No overt
pulmonary edema. Small bilateral pleural effusions are present. No
pneumothorax.

Osseous structures unchanged.
IMPRESSION: 1. Tip of the endotracheal tube 1.3 cm above the carina. Remaining
support apparatus as above.
2. New parenchymal infiltrate within the left upper lobe, concerning
for pneumonia.
3. Small bilateral pleural effusions with worsened bibasilar
opacities, which may reflect atelectasis or additional infiltrates.

## 2020-04-22 ENCOUNTER — Emergency Department (HOSPITAL_COMMUNITY)
Admission: EM | Admit: 2020-04-22 | Discharge: 2020-05-02 | Disposition: A | Discharge status: Home / Self Care | Payer: Medicaid Other | Attending: Emergency Medicine | Admitting: Emergency Medicine

## 2020-04-22 ENCOUNTER — Other Ambulatory Visit: Payer: Self-pay

## 2020-04-22 ENCOUNTER — Emergency Department (HOSPITAL_COMMUNITY): Payer: Medicaid Other

## 2020-04-22 DIAGNOSIS — E039 Hypothyroidism, unspecified: Secondary | ICD-10-CM | POA: Diagnosis not present

## 2020-04-22 DIAGNOSIS — R079 Chest pain, unspecified: Secondary | ICD-10-CM | POA: Insufficient documentation

## 2020-04-22 DIAGNOSIS — G479 Sleep disorder, unspecified: Secondary | ICD-10-CM | POA: Diagnosis present

## 2020-04-22 DIAGNOSIS — Z79899 Other long term (current) drug therapy: Secondary | ICD-10-CM | POA: Diagnosis not present

## 2020-04-22 DIAGNOSIS — R451 Restlessness and agitation: Secondary | ICD-10-CM

## 2020-04-22 DIAGNOSIS — Z20822 Contact with and (suspected) exposure to covid-19: Secondary | ICD-10-CM | POA: Insufficient documentation

## 2020-04-22 DIAGNOSIS — R4689 Other symptoms and signs involving appearance and behavior: Secondary | ICD-10-CM

## 2020-04-22 DIAGNOSIS — F918 Other conduct disorders: Secondary | ICD-10-CM | POA: Diagnosis not present

## 2020-04-22 DIAGNOSIS — R4189 Other symptoms and signs involving cognitive functions and awareness: Secondary | ICD-10-CM

## 2020-04-22 DIAGNOSIS — I10 Essential (primary) hypertension: Secondary | ICD-10-CM | POA: Insufficient documentation

## 2020-04-22 DIAGNOSIS — F17210 Nicotine dependence, cigarettes, uncomplicated: Secondary | ICD-10-CM | POA: Diagnosis not present

## 2020-04-22 DIAGNOSIS — R259 Unspecified abnormal involuntary movements: Secondary | ICD-10-CM | POA: Diagnosis present

## 2020-04-22 DIAGNOSIS — F4310 Post-traumatic stress disorder, unspecified: Secondary | ICD-10-CM | POA: Diagnosis present

## 2020-04-22 DIAGNOSIS — S069X0S Unspecified intracranial injury without loss of consciousness, sequela: Secondary | ICD-10-CM

## 2020-04-22 LAB — CBC
HCT: 37 % (ref 36.0–46.0)
Hemoglobin: 12.1 g/dL (ref 12.0–15.0)
MCH: 32.7 pg (ref 26.0–34.0)
MCHC: 32.7 g/dL (ref 30.0–36.0)
MCV: 100 fL (ref 80.0–100.0)
Platelets: 291 10*3/uL (ref 150–400)
RBC: 3.7 MIL/uL — ABNORMAL LOW (ref 3.87–5.11)
RDW: 12.6 % (ref 11.5–15.5)
WBC: 7.8 10*3/uL (ref 4.0–10.5)
nRBC: 0 % (ref 0.0–0.2)

## 2020-04-22 LAB — COMPREHENSIVE METABOLIC PANEL
ALT: 13 U/L (ref 0–44)
AST: 15 U/L (ref 15–41)
Albumin: 4.3 g/dL (ref 3.5–5.0)
Alkaline Phosphatase: 71 U/L (ref 38–126)
Anion gap: 10 (ref 5–15)
BUN: 14 mg/dL (ref 6–20)
CO2: 23 mmol/L (ref 22–32)
Calcium: 9 mg/dL (ref 8.9–10.3)
Chloride: 105 mmol/L (ref 98–111)
Creatinine, Ser: 0.89 mg/dL (ref 0.44–1.00)
GFR calc Af Amer: >60 mL/min (ref >60)
GFR calc non Af Amer: >60 mL/min (ref >60)
Glucose, Bld: 118 mg/dL — ABNORMAL HIGH (ref 70–99)
Potassium: 3.8 mmol/L (ref 3.5–5.1)
Sodium: 138 mmol/L (ref 135–145)
Total Bilirubin: 0.8 mg/dL (ref 0.3–1.2)
Total Protein: 7 g/dL (ref 6.5–8.1)

## 2020-04-22 LAB — RAPID URINE DRUG SCREEN, HOSP PERFORMED
Amphetamines: NOT DETECTED
Barbiturates: NOT DETECTED
Benzodiazepines: NOT DETECTED
Cocaine: NOT DETECTED
Opiates: NOT DETECTED
Tetrahydrocannabinol: NOT DETECTED

## 2020-04-22 LAB — TROPONIN I (HIGH SENSITIVITY)
Troponin I (High Sensitivity): <2 ng/L (ref <18)
Troponin I (High Sensitivity): <2 ng/L (ref <18)

## 2020-04-22 LAB — SALICYLATE LEVEL: Salicylate Lvl: <7 mg/dL — ABNORMAL LOW (ref 7.0–30.0)

## 2020-04-22 LAB — ACETAMINOPHEN LEVEL: Acetaminophen (Tylenol), Serum: <10 ug/mL — ABNORMAL LOW (ref 10–30)

## 2020-04-22 LAB — VALPROIC ACID LEVEL: Valproic Acid Lvl: <10 ug/mL — ABNORMAL LOW (ref 50.0–100.0)

## 2020-04-22 LAB — ETHANOL: Alcohol, Ethyl (B): <10 mg/dL (ref <10)

## 2020-04-22 MED ORDER — PANTOPRAZOLE SODIUM 40 MG PO TBEC
40.0000 mg | DELAYED_RELEASE_TABLET | Freq: Every day | ORAL | Status: DC
  Administered 2020-04-23 – 2020-05-02 (×10): 40 mg via ORAL
  Filled 2020-04-22 (×10): qty 1

## 2020-04-22 MED ORDER — LORAZEPAM 1 MG PO TABS
1.0000 mg | ORAL_TABLET | Freq: Every day | ORAL | Status: DC | PRN
  Administered 2020-04-23 – 2020-05-01 (×10): 1 mg via ORAL
  Filled 2020-04-22 (×10): qty 1

## 2020-04-22 MED ORDER — CEPHALEXIN 500 MG PO CAPS
500.0000 mg | ORAL_CAPSULE | Freq: Two times a day (BID) | ORAL | Status: AC
  Administered 2020-04-23 (×2): 500 mg via ORAL
  Filled 2020-04-22 (×2): qty 1

## 2020-04-22 MED ORDER — LIDOCAINE VISCOUS HCL 2 % MT SOLN
15.0000 mL | Freq: Once | OROMUCOSAL | Status: AC
  Administered 2020-04-22: 15 mL via ORAL
  Filled 2020-04-22: qty 15

## 2020-04-22 MED ORDER — PROPRANOLOL HCL 20 MG PO TABS
10.0000 mg | ORAL_TABLET | Freq: Every day | ORAL | Status: DC
  Administered 2020-04-23 – 2020-05-02 (×10): 10 mg via ORAL
  Filled 2020-04-22 (×2): qty 1
  Filled 2020-04-22 (×4): qty 0.5
  Filled 2020-04-22: qty 1
  Filled 2020-04-22: qty 0.5
  Filled 2020-04-22 (×2): qty 1
  Filled 2020-04-22: qty 0.5
  Filled 2020-04-22 (×3): qty 1
  Filled 2020-04-22: qty 0.5

## 2020-04-22 MED ORDER — LEVOTHYROXINE SODIUM 50 MCG PO TABS
100.0000 ug | ORAL_TABLET | Freq: Every day | ORAL | Status: DC
  Administered 2020-04-23 – 2020-05-02 (×10): 100 ug via ORAL
  Filled 2020-04-22 (×10): qty 2

## 2020-04-22 MED ORDER — VITAMIN D3 25 MCG (1000 UNIT) PO TABS
1000.0000 [IU] | ORAL_TABLET | Freq: Every day | ORAL | Status: DC
  Administered 2020-04-23 – 2020-05-02 (×10): 1000 [IU] via ORAL
  Filled 2020-04-22 (×21): qty 1

## 2020-04-22 MED ORDER — VITAMIN B 12 500 MCG PO TABS: 2.0000 | ORAL_TABLET | Freq: Every day | ORAL | Status: DC

## 2020-04-22 MED ORDER — ALUM & MAG HYDROXIDE-SIMETH 200-200-20 MG/5ML PO SUSP
30.0000 mL | Freq: Once | ORAL | Status: AC
  Administered 2020-04-22: 30 mL via ORAL
  Filled 2020-04-22: qty 30

## 2020-04-22 MED ORDER — ACETAMINOPHEN 500 MG PO TABS
500.0000 mg | ORAL_TABLET | Freq: Three times a day (TID) | ORAL | Status: DC | PRN
  Administered 2020-04-22 – 2020-04-24 (×3): 500 mg via ORAL
  Filled 2020-04-22 (×3): qty 1

## 2020-04-22 MED ORDER — IBUPROFEN 800 MG PO TABS
800.0000 mg | ORAL_TABLET | Freq: Three times a day (TID) | ORAL | Status: DC | PRN
  Administered 2020-04-23 – 2020-04-27 (×6): 800 mg via ORAL
  Filled 2020-04-22 (×7): qty 1

## 2020-04-22 MED ORDER — RISPERIDONE 1 MG PO TABS
2.0000 mg | ORAL_TABLET | Freq: Every day | ORAL | Status: DC
  Administered 2020-04-23 – 2020-04-24 (×2): 2 mg via ORAL
  Filled 2020-04-22 (×2): qty 2

## 2020-04-22 MED ORDER — QUETIAPINE FUMARATE 25 MG PO TABS
25.0000 mg | ORAL_TABLET | Freq: Every day | ORAL | Status: DC
  Administered 2020-04-22 – 2020-04-23 (×2): 25 mg via ORAL
  Filled 2020-04-22 (×2): qty 1

## 2020-04-22 MED ORDER — FERROUS SULFATE 325 (65 FE) MG PO TABS
325.0000 mg | ORAL_TABLET | Freq: Every day | ORAL | Status: DC
  Administered 2020-04-23 – 2020-05-02 (×10): 325 mg via ORAL
  Filled 2020-04-22 (×10): qty 1

## 2020-04-22 MED ORDER — GABAPENTIN 300 MG PO CAPS
300.0000 mg | ORAL_CAPSULE | Freq: Three times a day (TID) | ORAL | Status: DC
  Administered 2020-04-23 – 2020-04-24 (×4): 300 mg via ORAL
  Filled 2020-04-22 (×3): qty 1
  Filled 2020-04-22: qty 3
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
## 2020-04-22 NOTE — ED Notes (Signed)
Pt keeps exiting room requesting medications. MD notified. Pt redirected back to her room.
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
## 2020-04-23 ENCOUNTER — Encounter (HOSPITAL_COMMUNITY): Payer: Self-pay | Admitting: Emergency Medicine

## 2020-04-23 LAB — SARS CORONAVIRUS 2 BY RT PCR (HOSPITAL ORDER, PERFORMED IN ~~LOC~~ HOSPITAL LAB): SARS Coronavirus 2: NEGATIVE

## 2020-04-23 MED ORDER — VITAMIN B-12 1000 MCG PO TABS
1000.0000 ug | ORAL_TABLET | Freq: Every day | ORAL | Status: DC
  Administered 2020-04-23 – 2020-05-02 (×9): 1000 ug via ORAL
  Filled 2020-04-23 (×9): qty 1

## 2020-04-23 MED ORDER — HYDROXYZINE HCL 25 MG PO TABS
25.0000 mg | ORAL_TABLET | Freq: Three times a day (TID) | ORAL | Status: DC | PRN
  Administered 2020-04-23 – 2020-04-24 (×2): 25 mg via ORAL
  Filled 2020-04-23 (×2): qty 1

## 2020-04-23 MED ORDER — VITAMIN B 12 500 MCG PO TABS: 2.0000 | ORAL_TABLET | Freq: Every day | ORAL | Status: DC

## 2020-04-23 NOTE — ED Notes (Signed)
Pt out of bed in hall  Encouraged to go to room   Pt returned to room where night time meds were given as well as a PRN for her anxiety/agitation

## 2020-04-23 NOTE — ED Notes (Signed)
Waiting for pharmacy to bring Vitamin D and Vitamin B tablets

## 2020-04-23 NOTE — ED Notes (Signed)
Out of room pacing
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
## 2020-04-23 NOTE — ED Notes (Signed)
Pt standing at door   Looking out   She has been encouraged to stay in room

## 2020-04-23 NOTE — ED Notes (Signed)
Pt was given lunch tray.  

## 2020-04-23 NOTE — ED Notes (Signed)
Pt received her breakfast tray.

## 2020-04-23 NOTE — Progress Notes (Signed)
Patient meets criteria for inpatient treatment. No appropriate beds available at [facility] currently. CSW faxed referrals to the following facilities for review:  [facility]  TTS will continue to seek bed placement.   Maxie Better, MSW, LCSW Clinical Social Worker 04/23/2020 9:17 AM

## 2020-04-23 NOTE — ED Notes (Signed)
Out of bed to BR  Continues to insist that she will need more sleep meds than have been ordered

## 2020-04-23 NOTE — ED Notes (Signed)
repetitive speech and questions   She continues to complain of pain   Reports her mother is coming to see her  Asks about her grandchild coming is told no due to covid   Pacing room, attempting to walk out in hall   PRN ibuprofen for pain complaint and  Ativan for her agitation

## 2020-04-23 NOTE — ED Notes (Signed)
Continues to pace around room   Pt reports her mother is coming to see her

## 2020-04-23 NOTE — ED Notes (Signed)
meds given   Pt continues to request extra meds for sleep

## 2020-04-23 NOTE — ED Notes (Signed)
Pt appears fixated on chest pain  Ongoing yesterday and continues today   She also complains of headache   Of inability to sleep last night reports that she was not given enough sleep meds to sleep   She is pacing room peering out of the doorway   Is told she will be expected to stay in her room  She will be checked on regularly

## 2020-04-23 NOTE — ED Notes (Signed)
Covid Collected and to the lab

## 2020-04-24 ENCOUNTER — Encounter (HOSPITAL_COMMUNITY): Payer: Self-pay | Admitting: Registered Nurse

## 2020-04-24 LAB — TSH: TSH: 5.054 u[IU]/mL — ABNORMAL HIGH (ref 0.350–4.500)

## 2020-04-24 MED ORDER — GABAPENTIN 300 MG PO CAPS
300.0000 mg | ORAL_CAPSULE | Freq: Two times a day (BID) | ORAL | Status: DC
  Administered 2020-04-24 – 2020-05-02 (×16): 300 mg via ORAL
  Filled 2020-04-24 (×10): qty 1
  Filled 2020-04-24: qty 3
  Filled 2020-04-24 (×5): qty 1

## 2020-04-24 MED ORDER — QUETIAPINE FUMARATE 25 MG PO TABS: 50.0000 mg | ORAL_TABLET | Freq: Every day | ORAL | Status: DC

## 2020-04-24 MED ORDER — GABAPENTIN 300 MG PO CAPS
600.0000 mg | ORAL_CAPSULE | Freq: Every day | ORAL | Status: DC
  Administered 2020-04-24 – 2020-05-01 (×8): 600 mg via ORAL
  Filled 2020-04-24 (×8): qty 2

## 2020-04-24 NOTE — ED Provider Notes (Signed)
Notified by RN patient complaining of chest pain and pacing in room.  Her EKG is nonischemic and stable from prior.  Apparently she has had 5 cups of coffee today.  I asked the nurses to limit her to 1-2 cups per day at maximum.   Jacqueline Dusky, MD 04/24/20 [phone]
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
## 2020-04-24 NOTE — ED Notes (Signed)
Pts. Caffeine needs to be limited to two cups a day.

## 2020-04-24 NOTE — ED Notes (Signed)
Pt pacing the room.

## 2020-04-24 NOTE — ED Notes (Signed)
Offered pt assistance in taking a shower pt refuses to shower. Pt is pacing the room stating that her chest hurts and wanting something for pain. EKG and vitals were done.

## 2020-04-24 NOTE — ED Notes (Addendum)
Pt. Was complaining of chest. Vitals were obtained with a B/P of 103/75 and a pulse rate of 74. Notified EDP. Performed EKG on pt. And gave EDP the EKG.
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
## 2020-04-25 MED ORDER — HYDROXYZINE HCL 25 MG PO TABS
25.0000 mg | ORAL_TABLET | Freq: Three times a day (TID) | ORAL | Status: DC | PRN
  Administered 2020-04-25 – 2020-05-01 (×8): 25 mg via ORAL
  Filled 2020-04-25 (×8): qty 1

## 2020-04-25 MED ORDER — ACETAMINOPHEN 500 MG PO TABS
500.0000 mg | ORAL_TABLET | Freq: Three times a day (TID) | ORAL | Status: DC | PRN
  Administered 2020-04-25 – 2020-05-01 (×2): 500 mg via ORAL
  Filled 2020-04-25 (×2): qty 1

## 2020-04-25 NOTE — ED Notes (Signed)
[name] Guardian called  concerning pt verbalizing she will kill herself if she returns to the [facility] group home. Dr. Darlyn Chamber. Behavioral Health called and updated on pt saying she will kill herself and c/o chest pain since she was told she would be returning to group home. [place] cell: [phone], office [phone] ext. [place]
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
## 2020-04-25 NOTE — ED Notes (Signed)
Pt is awake watching tv at this time.

## 2020-04-25 NOTE — ED Notes (Signed)
Pt repeating herself that she has chest right after telling pt she would be going back to the group home. Staff is doing EKG right now.
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
## 2020-04-25 NOTE — ED Notes (Signed)
[facility] group [facility] called to give report of discharge at [phone]. The lady referred nurse to call administrator Ms. Maricela Bo at [phone]. Ms. Maricela Bo said someone would pick her up but it would be a few hours.

## 2020-04-25 NOTE — ED Notes (Signed)
Pt given decaf coffee at this time per her request.
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
## 2020-04-25 NOTE — ED Notes (Signed)
Pt requesting tylenol for "chest pain." RN notified.

## 2020-04-25 NOTE — ED Notes (Addendum)
SW consult requested. Message left with SW. Group home will not accept pt back into facility. DSS will be informed by SW. Pt is Psych cleared and is holding here at ED for placement.[phone] SW number)

## 2020-04-25 NOTE — Progress Notes (Addendum)
Patient ID: Jacqueline [name], female   DOB: April 14, 1966, 54 y.o.   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  Patient seen via telepsych by TTS and this provider; chart reviewed and consulted with Dr. Mallie Darting on 04/24/20.  On evaluation Jacqueline [name] is sitting up in bed, she is alert/oriented x2.  Patient gives incorrect location and reason why she was brought to the ED).  Patient complains of chest pain as the reason for being brought to the ED.  At this current time patient denies suicidal/self-harm/homicidal ideation, psychosis, and paranoia, depression or anxiety.  Patient does not appear to be in any acute distress.  Patient states that she gets along well with those she lives with.  Is able to states she lives in a facility with others but doesn't know the name of facility.  Patient also states that she is not sleeping well and hasn't had a good night sleep in couple days.  Stating that she is only getting 6 hours of sleep.  Reports she is eating without difficulty.  During evaluation Jacqueline [name] calm/cooperative, her affect is flat, speech is dull, monotone, and slow.  and mood congruent with affect. She has good eye contact.  There is no indication that she is currently responding to internal/external stimuli or experiencing delusional thought content.  Patient deniesuicidal/self-harm/homicidal ideation, psychosis, and paranoia.  Jacqueline [name]'s behavioral disturbance "agitation" is likely a baseline component of her traumatic brain injury.    Psychiatric re-evaluation 04/25/2020: Jacqueline [name] is a 54 year old female who presented to [facility] following concerns as noted above. During this evaluation, she is alert and oriented x4, calm, and cooperative. 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 She admitted that prior to going to the ED, she become upset although denied being aggressive. She denied making threats to others or making threats to harm herself. When asked if she was having current thoughts of wanting to harm herself she replied," No but I don't want to go back to the group home. If I go back. I amy just try to hurt myself. I will put a pillow over my face." She stated she has been living in current group home for  One year and added," they (staff and residents) are being mean to me so I don't want to go back." She could not elaborate on this. She denied prior suicide attempts  or events of self harm. Denied prior inpatient psychiatric hospitalizations. Stated if she did not have to return back to the group home, that she would not have any thoughts of self-harm. I inquire about legal guardian information and she stated that she could not recall the information.   Disposition: Patient endorses suicidal thought only in the context of returning back to the group home. She denies HI and psychosis and does not appear psychotic , manic, or in an acute state of distress. She has had no behavioral outburst while in the ED.  Medication changes made as noted by previous provider;  Discontinued Risperdal, started Zyprexa 15 mg Q hs and Bid prn agitation/behavior outburst, restarted Gabapentin 300 mg Bid and 600 mg Q hs, continued Ativan 1 mg daily PRN, and Vistaril 25 mg Tid Prn anxiety. At  this time, patient does not met criteria for inpatient psychiatric hospitalization and is therefore, psychiatrically cleared.  It is unclear as to who may be patients guardian and there is no number found for the group home. I will ask social worker to follow-up with guardian and group home to discuss disposition and safety plan as patient endorses suicidal thoughts only if she returns back to current group  home.   ED, Dr Jerilee Field updated on disposition.
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
## 2020-04-26 NOTE — Clinical Social Work Note (Addendum)
CSW notified by [facility] [place], on 7/19 at 5:38pm that [facility] was refusing to take back the patient. Per documentation, Ms. Maricela Bo, the administrator of the facility, stated, "If patient doesn't want to return back to the group home, it's the hospital's obligation to place her in a new home."  CSW called patient's mother, Rae Mar. Per mother, patient's legal guardian is [name] through [employer] [phone] ext [phone]). CSW called Ms. Timmons to discuss patient's placement. Per Ms. Timmons, [facility] does not want to take the patient back as long as she is endorsing SI in regards to returning and [facility] [place] will not take her back until "she receives treatment." CSW explained the patient is psych cleared and does not meet inpatient criteria, so she will need to follow up in an outpatient setting. Ms. Ma Rings reported she thought the patient was going to be taken to [facility] for psych treatment. CSW explained the patient is at [place] [facility], not [facility].  Ms. Ma Rings stated that group homes often drop off patients in EDs in [facility] and the social workers assist with placement. CSW reiterated that [facility] will need to find placement for the patient as it is not the hospital's responsibility to place the patient. Ms. Ma Rings stated she would review the patient's case with the clinical team and she would follow up with CSW. CSW also explained that [facility] refusing to take back the patient will result in a call to the state as [place] is the patient's place of residence. Ms. Ma Rings verbalized understanding.  Addendum: 11:04am-CSW completed FL2 and faxed it to Ms. Timmons [phone]).  11:37am-CSW called DSS and left voicemail for APS supervisor, Jolene Schimke, as the social worker for adult home specialists is unavailable. CSW requested call back.  CSW updated that [facility] has emergency discharged the patient from the facility.  3: 53pm-CSW faxed  family care home list to Ms. Timmons.  Tobi Bastos, LCSW Transitions of Care Clinical Social Worker [name] Ph: [phone]

## 2020-04-26 NOTE — Progress Notes (Signed)
Home Medication list (PTA med list) for Social Worker as of 04/24/20  Consider Medication Status and Discontinue  (Consider Medication Status and Discontinue) (Edit Last Dose)  acetaminophen (TYLENOL) 650 MG CR tablet  Take 650 mg by mouth daily as needed for pain. Informant: Nursing Home Medication Administration Guide (MAG), Last Dose: Not Recorded  Note written 04/22/2020 1914: Patient has both 500mg  and 650mg  listed as needed but has taken neither recently (Edit Note) (Remove Note)  Prescribed  Last DoseTimeTaking?  TodayYesterdayPast WeekPast MonthCompleted CourseNot Taking  at  gabapentin (NEURONTIN) 400 MG capsule  Take 2 capsules (800 mg total) by mouth 3 (three) times daily. For agitation, Starting Fri 10/07/2015, Normal  Patient taking differently: 300 mg Oral 3 times daily, For agitation, Indications: Agitation, Reason: Change in therapy, Informant: Nursing Home Medication Administration Guide (MAG), Reported on 04/22/2020, Last Dose: 04/22/2020 at 0800  Refills:0 ordered [place], [place] - [address]  TodayYesterdayPast WeekPast MonthCompleted CourseNot Taking  at  LORazepam (ATIVAN) 1 MG tablet  Take 1 tablet (1 mg total) by mouth every 6 (six) hours as needed for anxiety or sedation., Starting Wed 12/12/2016, Print  Patient taking differently: 1 mg Oral Daily PRN, anxiety, sedation, Reason: Free Text Sig Edit, Informant: Nursing Home Medication Administration Guide (MAG), Reported on 04/22/2020, Last Dose: 04/22/2020 at 1300  Refills:0 ordered [place], [place] - [id number]  Patient Reported  Last DoseTimeTaking?  TodayYesterdayPast WeekPast MonthCompleted CourseNot Taking  at  acetaminophen (TYLENOL) 500 MG tablet  Take 500 mg by mouth every 8 (eight) hours as needed for mild pain or moderate pain. Informant: Nursing Home Medication Administration Guide (MAG), Last Dose: 04/21/2020 at Unknown time  Note  written 04/22/2020 1914: Patient has both 500mg  and 650mg  listed as needed but has taken neither recently (Edit Note) (Remove Note)  TodayYesterdayPast WeekPast MonthCompleted CourseNot Taking  at  cephALEXin (KEFLEX) 500 MG capsule  Take 500 mg by mouth in the morning and at bedtime. Informant: Nursing Home Medication Administration Guide (MAG), Last Dose: 04/22/2020 at 0800  Note written 04/22/2020 1904: Patient has 3 doses left as of 04/22/2020 (Edit Note) (Remove Note)  TodayYesterdayPast WeekPast MonthCompleted CourseNot Taking  at  cholecalciferol (VITAMIN D) 25 MCG (1000 UNIT) tablet  Take 1,000 Units by mouth daily. Informant: Nursing Home Medication Administration Guide (MAG), Last Dose: 04/22/2020 at 0800  TodayYesterdayPast WeekPast MonthCompleted CourseNot Taking  at  Cyanocobalamin (VITAMIN B 12) 500 MCG TABS  Take 2 tablets by mouth daily. Informant: Nursing Home Medication Administration Guide (MAG), Last Dose: 04/22/2020 at 0800  TodayYesterdayPast WeekPast MonthCompleted CourseNot Taking  at  diphenhydrAMINE (BENADRYL) 25 MG tablet  Take 25 mg by mouth 2 (two) times daily as needed. Informant: Nursing Home Medication Administration Guide (MAG), Last Dose: 04/21/2020 at Unknown time  Note written 04/22/2020 1919: Patient has if needed but has not had to use recently (Edit Note) (Remove Note)  TodayYesterdayPast WeekPast MonthCompleted CourseNot Taking  at  ferrous sulfate 325 (65 FE) MG tablet  Take 325 mg by mouth daily with breakfast. Informant: Nursing Home Medication Administration Guide (MAG), Last Dose: 04/22/2020 at 0800  TodayYesterdayPast WeekPast MonthCompleted CourseNot Taking  at  ibuprofen (ADVIL) 800 MG tablet  Take 800 mg by mouth every 8 (eight) hours as needed. Informant: Nursing Home Medication Administration Guide (MAG), Last Dose: 04/22/2020 at [place] MonthCompleted CourseNot Taking  at  levothyroxine (SYNTHROID) 100 MCG tablet  Take  100 mcg  by mouth daily before breakfast. Informant: Nursing Home Medication Administration Guide (MAG), Last Dose: 04/22/2020 at 0800  TodayYesterdayPast WeekPast MonthCompleted CourseNot Taking  at  Capital One Tosylate (CAPLYTA) 42 MG CAPS  Take 1 capsule by mouth daily. Informant: Nursing Home Medication Administration Guide (MAG)  Patient not taking. Reason: Free Text Sig Edit, Informant: Nursing Home Medication Administration Guide (MAG), Reported on 04/22/2020, Last Dose: Not Taking at Unknown time  Note written 04/22/2020 1918: Per physician's orders, patient is to take once a day - however, per facility staff they have no record of it and medication is not in patient's pill pack (Edit Note) (Remove Note)  TodayYesterdayPast WeekPast MonthCompleted CourseNot Taking  at  nicotine (NICODERM CQ - DOSED IN MG/24 HOURS) 14 mg/24hr patch  Place 14 mg onto the skin daily. Informant: Nursing Home Medication Administration Guide (MAG), Last Dose: 04/21/2020 at Unknown time  TodayYesterdayPast WeekPast MonthCompleted CourseNot Taking  at  nicotine polacrilex (NICORETTE) 2 MG gum  Take 4 mg by mouth every 4 (four) hours as needed for smoking cessation. Informant: Nursing Home Medication Administration Guide (MAG), Last Dose: Not Recorded  Note written 04/22/2020 1920: Patient has if needed but has not had to use recently (Edit Note) (Remove Note)  TodayYesterdayPast WeekPast MonthCompleted CourseNot Taking  at  omeprazole (PRILOSEC) 20 MG capsule  Take 20 mg by mouth daily. Informant: Nursing Home Medication Administration Guide (MAG), Last Dose: 04/22/2020 at 0800  TodayYesterdayPast WeekPast MonthCompleted CourseNot Taking  at  paliperidone (INVEGA SUSTENNA) 156 MG/ML SUSY injection  Inject 156 mg into the muscle every 30 (thirty) days. Informant: Nursing Home Medication Administration Guide (MAG), Last Dose: 04/22/2020 at [place] MonthCompleted CourseNot Taking  at    promethazine (PHENERGAN) 2Consider Medication Status and Discontinue  (Consider Medication Status and Discontinue) (Edit Last Dose)  acetaminophen (TYLENOL) 650 MG CR tablet  Take 650 mg by mouth daily as needed for pain. Informant: Nursing Home Medication Administration Guide (MAG), Last Dose: Not Recorded  Note written 04/22/2020 1914: Patient has both 500mg  and 650mg  listed as needed but has taken neither recently (Edit Note) (Remove Note)  Prescribed  Last DoseTimeTaking?  TodayYesterdayPast WeekPast MonthCompleted CourseNot Taking  at  gabapentin (NEURONTIN) 400 MG capsule  Take 2 capsules (800 mg total) by mouth 3 (three) times daily. For agitation, Starting Fri 10/07/2015, Normal  Patient taking differently: 300 mg Oral 3 times daily, For agitation, Indications: Agitation, Reason: Change in therapy, Informant: Nursing Home Medication Administration Guide (MAG), Reported on 04/22/2020, Last Dose: 04/22/2020 at 0800  Refills:0 ordered [place], [place] - [address]  TodayYesterdayPast WeekPast MonthCompleted CourseNot Taking  at  LORazepam (ATIVAN) 1 MG tablet  Take 1 tablet (1 mg total) by mouth every 6 (six) hours as needed for anxiety or sedation., Starting Wed 12/12/2016, Print  Patient taking differently: 1 mg Oral Daily PRN, anxiety, sedation, Reason: Free Text Sig Edit, Informant: Nursing Home Medication Administration Guide (MAG), Reported on 04/22/2020, Last Dose: 04/22/2020 at 1300  Refills:0 ordered [place], [place] - [address]  Patient Reported  Last DoseTimeTaking?  TodayYesterdayPast WeekPast MonthCompleted CourseNot Taking  at  acetaminophen (TYLENOL) 500 MG tablet  Take 500 mg by mouth every 8 (eight) hours as needed for mild pain or moderate pain. Informant: Nursing Home Medication Administration Guide (MAG), Last Dose: 04/21/2020 at Unknown time  Note written 04/22/2020 1914: Patient has both  500mg  and 650mg  listed as needed but has taken neither  recently (Edit Note) (Remove Note)  TodayYesterdayPast WeekPast MonthCompleted CourseNot Taking  at  cephALEXin (KEFLEX) 500 MG capsule  Take 500 mg by mouth in the morning and at bedtime. Informant: Nursing Home Medication Administration Guide (MAG), Last Dose: 04/22/2020 at 0800  Note written 04/22/2020 1904: Patient has 3 doses left as of 04/22/2020 (Edit Note) (Remove Note)  TodayYesterdayPast WeekPast MonthCompleted CourseNot Taking  at  cholecalciferol (VITAMIN D) 25 MCG (1000 UNIT) tablet  Take 1,000 Units by mouth daily. Informant: Nursing Home Medication Administration Guide (MAG), Last Dose: 04/22/2020 at 0800  TodayYesterdayPast WeekPast MonthCompleted CourseNot Taking  at  Cyanocobalamin (VITAMIN B 12) 500 MCG TABS  Take 2 tablets by mouth daily. Informant: Nursing Home Medication Administration Guide (MAG), Last Dose: 04/22/2020 at 0800  TodayYesterdayPast WeekPast MonthCompleted CourseNot Taking  at  diphenhydrAMINE (BENADRYL) 25 MG tablet  Take 25 mg by mouth 2 (two) times daily as needed. Informant: Nursing Home Medication Administration Guide (MAG), Last Dose: 04/21/2020 at Unknown time  Note written 04/22/2020 1919: Patient has if needed but has not had to use recently (Edit Note) (Remove Note)  TodayYesterdayPast WeekPast MonthCompleted CourseNot Taking  at  ferrous sulfate 325 (65 FE) MG tablet  Take 325 mg by mouth daily with breakfast. Informant: Nursing Home Medication Administration Guide (MAG), Last Dose: 04/22/2020 at 0800  TodayYesterdayPast WeekPast MonthCompleted CourseNot Taking  at  ibuprofen (ADVIL) 800 MG tablet  Take 800 mg by mouth every 8 (eight) hours as needed. Informant: Nursing Home Medication Administration Guide (MAG), Last Dose: 04/22/2020 at [place] MonthCompleted CourseNot Taking  at  levothyroxine (SYNTHROID) 100 MCG tablet  Take 100 mcg by mouth daily before breakfast.  Informant: Nursing Home Medication Administration Guide (MAG), Last Dose: 04/22/2020 at 0800  TodayYesterdayPast WeekPast MonthCompleted CourseNot Taking  at  Capital One Tosylate (CAPLYTA) 42 MG CAPS  Take 1 capsule by mouth daily. Informant: Nursing Home Medication Administration Guide (MAG)  Patient not taking. Reason: Free Text Sig Edit, Informant: Nursing Home Medication Administration Guide (MAG), Reported on 04/22/2020, Last Dose: Not Taking at Unknown time  Note written 04/22/2020 1918: Per physician's orders, patient is to take once a day - however, per facility staff they have no record of it and medication is not in patient's pill pack (Edit Note) (Remove Note)  TodayYesterdayPast WeekPast MonthCompleted CourseNot Taking  at  nicotine (NICODERM CQ - DOSED IN MG/24 HOURS) 14 mg/24hr patch  Place 14 mg onto the skin daily. Informant: Nursing Home Medication Administration Guide (MAG), Last Dose: 04/21/2020 at Unknown time  TodayYesterdayPast WeekPast MonthCompleted CourseNot Taking  at  nicotine polacrilex (NICORETTE) 2 MG gum  Take 4 mg by mouth every 4 (four) hours as needed for smoking cessation. Informant: Nursing Home Medication Administration Guide (MAG), Last Dose: Not Recorded  Note written 04/22/2020 1920: Patient has if needed but has not had to use recently (Edit Note) (Remove Note)  TodayYesterdayPast WeekPast MonthCompleted CourseNot Taking  at  omeprazole (PRILOSEC) 20 MG capsule  Take 20 mg by mouth daily. Informant: Nursing Home Medication Administration Guide (MAG), Last Dose: 04/22/2020 at 0800  TodayYesterdayPast WeekPast MonthCompleted CourseNot Taking  at  paliperidone (INVEGA SUSTENNA) 156 MG/ML SUSY injection  Inject 156 mg into the muscle every 30 (thirty) days. Informant: Nursing Home Medication Administration Guide (MAG), Last Dose: 04/22/2020 at [place] MonthCompleted CourseNot Taking  at  promethazine (PHENERGAN) 25 MG tablet  Take  50 mg by mouth 2 (two) times daily as needed for nausea or vomiting. Informant:  Nursing Home Medication Administration Guide (MAG), Last Dose: Not Recorded  Note written 04/22/2020 1921: Patient has if needed but has not had to use recently (Edit Note) (Remove Note)  TodayYesterdayPast WeekPast MonthCompleted CourseNot Taking  at  propranolol (INDERAL) 10 MG tablet  Take 10 mg by mouth daily. Informant: Nursing Home Medication Administration Guide (MAG), Last Dose: 04/22/2020 at 0800  TodayYesterdayPast WeekPast MonthCompleted CourseNot Taking  at  QUEtiapine (SEROQUEL) 25 MG tablet  Take 25 mg by mouth at bedtime. Informant: Nursing Home Medication Administration Guide (MAG), Last Dose: 04/21/2020 at Unknown time  TodayYesterdayPast WeekPast MonthCompleted CourseNot Taking  at  risperiDONE (RISPERDAL) 2 MG tablet  Take 2 mg by mouth daily. Informant: Nursing Home Medication Administration Guide (MAG), Last Dose: 04/22/2020 at 0800  TodayYesterdayPast WeekPast MonthCompleted CourseNot Taking  at  scopolamine (TRANSDERM-SCOP) 1 MG/3DAYS  Place 1 patch onto the skin every 3 (three) days. Informant: Nursing Home Medication Administration Guide (MAG), Last Dose: Not Recorded  Note written 04/22/2020 1920: Patient has if needed but has not had to use recently (Edit Note) (Remove Note)  TodayYesterdayPast WeekPast MonthCompleted CourseNot Taking  at  selenium sulfide (SELSUN) 2.5 % shampoo  Apply 1 application topically daily as needed for irritation. Informant: Nursing Home Medication Administration Guide (MAG), Last Dose: Not Recorded  Note written 04/22/2020 1918: Patient has if needed but has not had to use recently (Edit Note) (Remove Note)  TodayYesterdayPast WeekPast MonthCompleted CourseNot Taking  at  Vitamins A & D (VITAMIN A & D) ointment  Apply 1 application topically 2 (two) times daily as needed for dry skin. Informant: Nursing Home Medication Administration Guide (MAG), Last Dose:  04/22/2020 at 0800 5 MG tablet  Take 50 mg by mouth 2 (two) times daily as needed for nausea or vomiting. Informant: Nursing Home Medication Administration Guide (MAG), Last Dose: Not Recorded  Note written 04/22/2020 1921: Patient has if needed but has not had to use recently (Edit Note) (Remove Note)  TodayYesterdayPast WeekPast MonthCompleted CourseNot Taking  at  propranolol (INDERAL) 10 MG tablet  Take 10 mg by mouth daily. Informant: Nursing Home Medication Administration Guide (MAG), Last Dose: 04/22/2020 at 0800  TodayYesterdayPast WeekPast MonthCompleted CourseNot Taking  at  QUEtiapine (SEROQUEL) 25 MG tablet  Take 25 mg by mouth at bedtime. Informant: Nursing Home Medication Administration Guide (MAG), Last Dose: 04/21/2020 at Unknown time  TodayYesterdayPast WeekPast MonthCompleted CourseNot Taking  at  risperiDONE (RISPERDAL) 2 MG tablet  Take 2 mg by mouth daily. Informant: Nursing Home Medication Administration Guide (MAG), Last Dose: 04/22/2020 at 0800  TodayYesterdayPast WeekPast MonthCompleted CourseNot Taking  at  scopolamine (TRANSDERM-SCOP) 1 MG/3DAYS  Place 1 patch onto the skin every 3 (three) days. Informant: Nursing Home Medication Administration Guide (MAG), Last Dose: Not Recorded  Note written 04/22/2020 1920: Patient has if needed but has not had to use recently (Edit Note) (Remove Note)  TodayYesterdayPast WeekPast MonthCompleted CourseNot Taking  at  selenium sulfide (SELSUN) 2.5 % shampoo  Apply 1 application topically daily as needed for irritation. Informant: Nursing Home Medication Administration Guide (MAG), Last Dose: Not Recorded  Note written 04/22/2020 1918: Patient has if needed but has not had to use recently (Edit Note) (Remove Note)  TodayYesterdayPast WeekPast MonthCompleted CourseNot Taking  at  Vitamins A & D (VITAMIN A & D) ointment  Apply 1 application topically 2 (two) times daily as needed for dry skin. Informant: Nursing Home Medication  Administration Guide (MAG), Last Dose: 04/22/2020 at 0800
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
## 2020-04-27 NOTE — ED Notes (Signed)
Patient was given two juices.

## 2020-04-27 NOTE — ED Notes (Signed)
Patient was given lunch tray.

## 2020-04-28 MED ORDER — STERILE WATER FOR INJECTION IJ SOLN
INTRAMUSCULAR | Status: AC
  Administered 2020-04-28: 10 mL
  Filled 2020-04-28: qty 10

## 2020-04-28 MED ORDER — ZIPRASIDONE MESYLATE 20 MG IM SOLR: 10.0000 mg | Freq: Once | INTRAMUSCULAR | Status: AC

## 2020-04-28 MED ORDER — ZIPRASIDONE MESYLATE 20 MG IM SOLR
INTRAMUSCULAR | Status: AC
  Administered 2020-04-28: 10 mg via INTRAMUSCULAR
  Filled 2020-04-28: qty 20

## 2020-04-28 NOTE — TOC Progression Note (Signed)
Transition of Care [facility]) - Progression Note   Patient Details  Name: Jacqueline [name] MRN: [id number] Date of Birth: 12-Jul-1966  Transition of Care [facility]) CM/SW [place], LCSW Phone Number: 04/28/2020, 4:23 PM  Clinical Narrative: CSW attempted to call patient's legal guardian, [name], but was unable to reach her. CSW left voicemail requesting call back regarding referral to [facility].  Expected Discharge Plan: Group Home Barriers to Discharge: ED Patient Insisting on an Alternate Living Situation/Facility, ED Facility/Family Refusing to Allow Patient to Return  Expected Discharge Plan and Services Expected Discharge Plan: Group Home In-house Referral: Clinical Social Work Discharge Planning Services: NA Post Acute Care Choice: NA Living arrangements for the past 2 months: [place] ([place] #2 [facility])            DME Arranged: N/A DME Agency: NA HH Arranged: NA [place] Agency: NA  Readmission Risk Interventions No flowsheet data found.

## 2020-04-28 NOTE — ED Notes (Signed)
Pt ambulated to the BR with a steady gate.

## 2020-04-28 NOTE — ED Notes (Signed)
Meal provided 

## 2020-04-28 NOTE — ED Notes (Signed)
Patient ambulated to the restroom with a steady gait.

## 2020-04-29 NOTE — ED Notes (Signed)
Pt keeps coming into the hallway stating "my cloths, coat, and air conditioner are in that room." Pt instructed to go back to her room that her belongings are probably locked in the EMS locker room and that she would not get them until she is discharged from the facility. [name] RN aware.

## 2020-04-29 NOTE — ED Notes (Signed)
Pt given meal tray and shown how to work the [employer].

## 2020-04-29 NOTE — ED Notes (Signed)
Pt. Came out of room asking for their coat out of the closet. Redirected pt. Back to their room and educated pt. That they do not have a coat in the ED.

## 2020-04-29 NOTE — TOC Progression Note (Signed)
Transition of Care [facility]) - Progression Note   Patient Details  Name: Jacqueline [name] MRN: [id number] Date of Birth: [birth year]  Transition of Care [facility]) CM/SW [place], LCSW Phone Number: 04/29/2020, 3:05 PM  Clinical Narrative: CSW spoke with patient's legal guardian, [name], to follow up with placement for patient. Per Ms. Timmons, she is still waiting to hear back from [facility] for placement and the patient has been assigned a care coordinator through [facility], April Ratliff [phone]). Ms. Ma Rings reported the care coordinator has submitted an application to [employer], a funding source that would allow additional staff to be hired to assist the patient in a group home or [facility]. Ms. Ma Rings reported they expect to hear back by Monday/Tuesday of next week and she will reach out to [facility] again to see if the facility will take back the patient with an additional funding source.  Expected Discharge Plan: Group Home Barriers to Discharge: ED Patient Insisting on an Alternate Living Situation/Facility, ED Facility/Family Refusing to Allow Patient to Return  Expected Discharge Plan and Services Expected Discharge Plan: Group Home In-house Referral: Clinical Social Work Discharge Planning Services: NA Post Acute Care Choice: NA Living arrangements for the past 2 months: [place] ([place] #2 [facility])             DME Arranged: N/A DME Agency: NA HH Arranged: NA [place] Agency: NA  Readmission Risk Interventions No flowsheet data found.

## 2020-04-29 NOTE — ED Notes (Signed)
Pt given a pack of graham crackers and a juice at this time.

## 2020-04-29 NOTE — ED Notes (Signed)
Pt resting comfortably in bed covered with blankets. Even rise and fall of the chest. Will obtain vitals when pt awakens.

## 2020-04-29 NOTE — ED Notes (Signed)
Pt. Came out of room again attempting to go to room 15 to get their " three coats, shampoo and conditioner from the closest." Escorted pt. Back to room 9 and explained that there is no coats in room 15 and that they will get more shampoo and conditioner when they take a shower.

## 2020-04-30 NOTE — ED Notes (Signed)
Continues to be agitated  Will not stay in room   Repetitively asks if she is going to be given night meds   If staff will come talk with her  If she can leave her TV on all night   Unable to redirect

## 2020-04-30 NOTE — ED Notes (Signed)
Pt continues to hold without placement   She is quieter than past assignments but continues to be demanding of food and juices   Her 0700 and 1000 meds are given

## 2020-04-30 NOTE — ED Notes (Signed)
Pt has eaten two suppers

## 2020-04-30 NOTE — ED Notes (Signed)
Patient vitals updated and given breakfast tray at this time.

## 2020-04-30 NOTE — ED Notes (Signed)
Pt on 8th day awaiting placement by SW  She is calmer, more cooperative   Meal provided for lunch

## 2020-04-30 NOTE — ED Notes (Signed)
Pt is agitated  Was given lorazepam earlier for same   Pt will not stay in room   Continually asks for phone, sleep meds, to speak with different staff  All while department is busy with emergent patients   Pt continues to await placement by SW

## 2020-05-01 NOTE — ED Notes (Signed)
Pt told multiple times to stay in room

## 2020-05-01 NOTE — ED Notes (Signed)
Pt standing in doorway, resps even and unlabored, pt reports decreased pain.

## 2020-05-01 NOTE — ED Notes (Signed)
Pt standing in room watching tv.  resps even and unlabored

## 2020-05-01 NOTE — ED Notes (Signed)
Gave pt new diaper

## 2020-05-01 NOTE — ED Notes (Signed)
md notified of cp

## 2020-05-01 NOTE — ED Notes (Signed)
Redirected pt to her room, pt ambulatory with steady gait.

## 2020-05-01 NOTE — ED Notes (Signed)
Pt in bed watching tv, pt requests that she can keep her tv on all night, states that this will be fine, when asked about pain, pt states that she has some chest pain, but it comes and goes.  resps even and unlabored.

## 2020-05-02 NOTE — TOC Transition Note (Signed)
Transition of Care [facility]) - CM/SW Discharge Note  Patient Details  Name: Jacqueline [name] MRN: [id number] Date of Birth: 04-Oct-1966  Transition of Care [facility]) CM/SW Contact:  Sherie Don, LCSW Phone Number: 05/02/2020, 2:33 PM  Clinical Narrative: CSW received call from [facility] with [facility]. Per [place], [facility] is willing to take back patient. CSW spoke with patient and patient is agreeable to returning. CSW spoke with Janeth Rase with [facility], who provided CSW with fax number for FL2 [phone]). Jacqueline [name] agreeable to medication list being printed with discharge paperwork. FL2 faxed to [facility]. TOC signing off.  Final next level of care: Other (comment) ([place]) Barriers to Discharge: ED Barriers Resolved  Patient Goals and CMS Choice Patient states their goals for this hospitalization and ongoing recovery are:: Patient will discharge back to [facility].gov Compare Post Acute Care list provided to:: Patient Represenative (must comment) Choice offered to / list presented to : [facility] POA / Guardian (Jacqueline [name] (legal guardian w/[employer]))  Discharge Placement Patient to be transferred to facility by: [place] staff Patient and family notified of of transfer: 05/02/20  Discharge Plan and Services In-house Referral: Clinical Social Work Discharge Planning Services: NA Post Acute Care Choice: NA          DME Arranged: N/A DME Agency: NA HH Arranged: NA HH Agency: NA  Readmission Risk Interventions No flowsheet data found.
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
## 2020-05-02 NOTE — ED Provider Notes (Signed)
°  Physical Exam  BP (!) 130/69    Pulse 69    Temp 98 F (36.7 C) (Oral)    Resp 16    Ht 5\' 9"  (1.753 m)    Wt 99 kg    LMP 10/09/2016 Comment: neg. pregnancy test per RN in chart   SpO2 100%    BMI 32.23 kg/m   Physical Exam  ED Course/Procedures     Procedures  MDM  [facility]       Davonna Belling, MD 05/02/20 1408

## 2021-10-25 DIAGNOSIS — K029 Dental caries, unspecified: Secondary | ICD-10-CM | POA: Insufficient documentation

## 2021-10-25 DIAGNOSIS — S064XAA Epidural hemorrhage with loss of consciousness status unknown, initial encounter: Secondary | ICD-10-CM | POA: Insufficient documentation

## 2021-10-25 DIAGNOSIS — Z22322 Carrier or suspected carrier of Methicillin resistant Staphylococcus aureus: Secondary | ICD-10-CM | POA: Insufficient documentation

## 2022-04-03 DIAGNOSIS — K5909 Other constipation: Secondary | ICD-10-CM | POA: Insufficient documentation

## 2022-04-03 DIAGNOSIS — Z9884 Bariatric surgery status: Secondary | ICD-10-CM | POA: Insufficient documentation

## 2022-09-10 DIAGNOSIS — H7191 Unspecified cholesteatoma, right ear: Secondary | ICD-10-CM | POA: Insufficient documentation

## 2023-03-01 ENCOUNTER — Emergency Department (HOSPITAL_COMMUNITY)
Admission: EM | Admit: 2023-03-01 | Discharge: 2023-03-01 | Disposition: A | Discharge status: Home / Self Care | Payer: No Typology Code available for payment source | Attending: Emergency Medicine | Admitting: Emergency Medicine

## 2023-03-01 DIAGNOSIS — F69 Unspecified disorder of adult personality and behavior: Secondary | ICD-10-CM | POA: Insufficient documentation

## 2023-03-01 LAB — CBC WITH DIFFERENTIAL/PLATELET
Abs Immature Granulocytes: 0.03 10*3/uL (ref 0.00–0.07)
Basophils Absolute: 0 10*3/uL (ref 0.0–0.1)
Basophils Relative: 1 %
Eosinophils Absolute: 0.2 10*3/uL (ref 0.0–0.5)
Eosinophils Relative: 3 %
HCT: 37.1 % (ref 36.0–46.0)
Hemoglobin: 11.9 g/dL — ABNORMAL LOW (ref 12.0–15.0)
Immature Granulocytes: 1 %
Lymphocytes Relative: 23 %
Lymphs Abs: 1.5 10*3/uL (ref 0.7–4.0)
MCH: 33.1 pg (ref 26.0–34.0)
MCHC: 32.1 g/dL (ref 30.0–36.0)
MCV: 103.1 fL — ABNORMAL HIGH (ref 80.0–100.0)
Monocytes Absolute: 0.9 10*3/uL (ref 0.1–1.0)
Monocytes Relative: 13 %
Neutro Abs: 3.9 10*3/uL (ref 1.7–7.7)
Neutrophils Relative %: 59 %
Platelets: 239 10*3/uL (ref 150–400)
RBC: 3.6 MIL/uL — ABNORMAL LOW (ref 3.87–5.11)
RDW: 14 % (ref 11.5–15.5)
WBC: 6.5 10*3/uL (ref 4.0–10.5)
nRBC: 0 % (ref 0.0–0.2)

## 2023-03-01 LAB — URINALYSIS, ROUTINE W REFLEX MICROSCOPIC
Bilirubin Urine: NEGATIVE
Glucose, UA: NEGATIVE mg/dL
Hgb urine dipstick: NEGATIVE
Ketones, ur: NEGATIVE mg/dL
Leukocytes,Ua: NEGATIVE
Nitrite: NEGATIVE
Protein, ur: NEGATIVE mg/dL
Specific Gravity, Urine: 1.005 (ref 1.005–1.030)
pH: 7 (ref 5.0–8.0)

## 2023-03-01 LAB — COMPREHENSIVE METABOLIC PANEL
ALT: 16 U/L (ref 0–44)
AST: 18 U/L (ref 15–41)
Albumin: 3.3 g/dL — ABNORMAL LOW (ref 3.5–5.0)
Alkaline Phosphatase: 71 U/L (ref 38–126)
Anion gap: 10 (ref 5–15)
BUN: 14 mg/dL (ref 6–20)
CO2: 25 mmol/L (ref 22–32)
Calcium: 8.5 mg/dL — ABNORMAL LOW (ref 8.9–10.3)
Chloride: 108 mmol/L (ref 98–111)
Creatinine, Ser: 0.76 mg/dL (ref 0.44–1.00)
GFR, Estimated: >60 mL/min (ref >60)
Glucose, Bld: 88 mg/dL (ref 70–99)
Potassium: 3.6 mmol/L (ref 3.5–5.1)
Sodium: 143 mmol/L (ref 135–145)
Total Bilirubin: 0.4 mg/dL (ref 0.3–1.2)
Total Protein: 6.1 g/dL — ABNORMAL LOW (ref 6.5–8.1)

## 2023-03-01 LAB — SALICYLATE LEVEL: Salicylate Lvl: <7 mg/dL — ABNORMAL LOW (ref 7.0–30.0)

## 2023-03-01 LAB — ACETAMINOPHEN LEVEL: Acetaminophen (Tylenol), Serum: <10 ug/mL — ABNORMAL LOW (ref 10–30)

## 2023-03-01 LAB — TSH: TSH: 1.062 u[IU]/mL (ref 0.350–4.500)

## 2023-03-01 LAB — ETHANOL: Alcohol, Ethyl (B): <10 mg/dL (ref <10)

## 2023-03-01 NOTE — ED Notes (Signed)
Dr. Manus Gunning spoke with Legal Guardian Tresa Endo B. On the phone at length about discharge. Pt's caretaker at bedside initially disagreeable with discharge back to facility, but then ultimately agreed to take pt home. Pt given information on AA meetings per pt request and pt discharged back to facility with caretaker without issue.

## 2023-03-01 NOTE — ED Triage Notes (Signed)
Pt presents with caretaker from [facility] who reports that pt left the group home  this morning in an attempt to leave. Pt reports dissatisfaction with living situation and was found walking in the road. Pt denies SI/HI, but caretaker reports that pt has stated in the past "I'm going to die if I don't get my way". Pt denies AVH.

## 2023-03-01 NOTE — Discharge Planning (Signed)
RNCM and SW met with pt at bedside regarding discharge planning.  Pt respite provider at bedside and agrees to take pt back to home at discharge.

## 2023-03-01 NOTE — ED Provider Notes (Signed)
[place] EMERGENCY DEPARTMENT AT [facility] Provider Note   CSN: [id number] Arrival date & time: 03/01/23  8657     History  Chief Complaint  Patient presents with   Elopement    Jacqueline [name] is a 57 y.o. female.  Patient from assisted living facility where she tried to leave this morning.  States she does not want to be there and does not like staying there because she is forced to stay in her room.  Caregiver reports patient tried to leave the facility at 3 AM that was successful at 6 AM when she walked on the sidewalk.  No fall or injury.  She was found walking on the road.  Patient reports she does not want to live where she is living.  Denies any suicidal thoughts or homicidal thoughts.  Denies any trauma or injury.  Denies hearing any voices.  Denies any alcohol or drug use.  The history is provided by the patient.       Home Medications Prior to Admission medications   Medication Sig Start Date End Date Taking? Authorizing Provider  acetaminophen (TYLENOL) 500 MG tablet Take 500 mg by mouth every 8 (eight) hours as needed for mild pain or moderate pain.    [provider]  acetaminophen (TYLENOL) 650 MG CR tablet Take 650 mg by mouth daily as needed for pain.    [provider]  cholecalciferol (VITAMIN D) 25 MCG (1000 UNIT) tablet Take 1,000 Units by mouth daily.    [provider]  Cyanocobalamin (VITAMIN B 12) 500 MCG TABS Take 2 tablets by mouth daily.    [provider]  diphenhydrAMINE (BENADRYL) 25 MG tablet Take 25 mg by mouth 2 (two) times daily as needed.    [provider]  ferrous sulfate 325 (65 FE) MG tablet Take 325 mg by mouth daily with breakfast.    [provider]  gabapentin (NEURONTIN) 400 MG capsule Take 2 capsules (800 mg total) by mouth 3 (three) times daily. For agitation Patient taking differently: Take 300 mg by mouth 3 (three) times daily. For agitation 10/07/15   Armandina Stammer  I, NP  ibuprofen (ADVIL) 800 MG tablet Take 800 mg by mouth every 8 (eight) hours as needed.    [provider]  levothyroxine (SYNTHROID) 100 MCG tablet Take 100 mcg by mouth daily before breakfast.    [provider]  LORazepam (ATIVAN) 1 MG tablet Take 1 tablet (1 mg total) by mouth every 6 (six) hours as needed for anxiety or sedation. Patient taking differently: Take 1 mg by mouth daily as needed for anxiety or sedation.  12/12/16   Angiulli, Mcarthur Rossetti, PA-C  Lumateperone Tosylate (CAPLYTA) 42 MG CAPS Take 1 capsule by mouth daily. Patient not taking: Reported on 04/22/2020    [provider]  nicotine (NICODERM CQ - DOSED IN MG/24 HOURS) 14 mg/24hr patch Place 14 mg onto the skin daily.    [provider]  nicotine polacrilex (NICORETTE) 2 MG gum Take 4 mg by mouth every 4 (four) hours as needed for smoking cessation.    [provider]  omeprazole (PRILOSEC) 20 MG capsule Take 20 mg by mouth daily.  06/27/16   [provider]  paliperidone (INVEGA SUSTENNA) 156 MG/ML SUSY injection Inject 156 mg into the muscle every 30 (thirty) days.    [provider]  promethazine (PHENERGAN) 25 MG tablet Take 50 mg by mouth 2 (two) times daily as needed for nausea  or vomiting.    [provider]  propranolol (INDERAL) 10 MG tablet Take 10 mg by mouth daily.    [provider]  QUEtiapine (SEROQUEL) 25 MG tablet Take 25 mg by mouth at bedtime.    [provider]  risperiDONE (RISPERDAL) 2 MG tablet Take 2 mg by mouth daily.    [provider]  scopolamine (TRANSDERM-SCOP) 1 MG/3DAYS Place 1 patch onto the skin every 3 (three) days.    [provider]  selenium sulfide (SELSUN) 2.5 % shampoo Apply 1 application topically daily as needed for irritation.    [provider]  Vitamins A & D (VITAMIN A & D) ointment Apply 1 application topically 2 (two) times daily as needed for dry skin.    [provider]  furosemide (LASIX) 20 MG tablet Take 1 tablet (20 mg total) by mouth daily as needed for edema. Patient not taking: Reported on 03/13/2016 02/19/16 03/19/16  Loren Racer, MD  potassium chloride (K-DUR) 10 MEQ tablet Take 1 tablet (10 mEq total) by mouth daily. 03/19/16 03/30/16  Linwood Dibbles, MD      Allergies    Nsaids and Carbamazepine    Review of Systems   Review of Systems  Constitutional:  Negative for activity change, appetite change and fever.  HENT:  Negative for congestion.   Respiratory:  Negative for cough, chest tightness and shortness of breath.   Gastrointestinal:  Negative for abdominal pain, nausea and vomiting.  Genitourinary:  Negative for dysuria and hematuria.  Musculoskeletal:  Negative for arthralgias and myalgias.  Skin:  Negative for rash.  Neurological:  Negative for weakness.  Psychiatric/Behavioral:  Positive for behavioral problems. Negative for self-injury and suicidal ideas. The patient is not nervous/anxious.    all other systems are negative except as noted in the HPI and PMH.    Physical Exam Updated Vital Signs BP (!) 116/54 (BP Location: Right Arm)   Pulse 78   Temp 98.5 F (36.9 C) (Oral)   Resp 18   LMP 10/09/2016 Comment: neg. pregnancy test per RN in chart  SpO2 100%  Physical Exam Vitals and nursing note reviewed.  Constitutional:      General: She is not in acute distress.    Appearance: She is well-developed.  HENT:     Head: Normocephalic and atraumatic.     Mouth/Throat:     Pharynx: No oropharyngeal exudate.  Eyes:     Conjunctiva/sclera: Conjunctivae normal.     Pupils: Pupils are equal, round, and reactive to light.  Neck:     Comments: No meningismus. Cardiovascular:     Rate and Rhythm: Normal rate and regular rhythm.     Heart sounds: Normal heart sounds. No murmur heard. Pulmonary:     Effort: Pulmonary effort is normal. No respiratory distress.     Breath sounds: Normal breath sounds.  Abdominal:      Palpations: Abdomen is soft.     Tenderness: There is no abdominal tenderness. There is no guarding or rebound.  Musculoskeletal:        General: No tenderness. Normal range of motion.     Cervical back: Normal range of motion and neck supple.  Skin:    General: Skin is warm.  Neurological:     Mental Status: She is alert and oriented to person, place, and time.     Cranial Nerves: No cranial nerve deficit.     Motor: No abnormal muscle tone.     Coordination: Coordination normal.  Comments:  5/5 strength throughout. CN 2-12 intact.Equal grip strength.   Psychiatric:        Behavior: Behavior normal.     ED Results / Procedures / Treatments   Labs (all labs ordered are listed, but only abnormal results are displayed) Labs Reviewed  CBC WITH DIFFERENTIAL/PLATELET - Abnormal; Notable for the following components:      Result Value   RBC 3.60 (*)    Hemoglobin 11.9 (*)    MCV 103.1 (*)    All other components within normal limits  COMPREHENSIVE METABOLIC PANEL - Abnormal; Notable for the following components:   Calcium 8.5 (*)    Total Protein 6.1 (*)    Albumin 3.3 (*)    All other components within normal limits  URINALYSIS, ROUTINE W REFLEX MICROSCOPIC - Abnormal; Notable for the following components:   Color, Urine STRAW (*)    All other components within normal limits  ACETAMINOPHEN LEVEL - Abnormal; Notable for the following components:   Acetaminophen (Tylenol), Serum <10 (*)    All other components within normal limits  SALICYLATE LEVEL - Abnormal; Notable for the following components:   Salicylate Lvl <7.0 (*)    All other components within normal limits  ETHANOL  TSH  RAPID URINE DRUG SCREEN, HOSP PERFORMED    EKG None  Radiology No results found.  Procedures Procedures    Medications Ordered in ED Medications - No data to display  ED Course/ Medical Decision Making/ A&P                             Medical Decision Making Amount and/or  Complexity of Data Reviewed Labs: ordered. Decision-making details documented in ED Course. Radiology: ordered and independent interpretation performed. Decision-making details documented in ED Course. ECG/medicine tests: ordered and independent interpretation performed. Decision-making details documented in ED Course.   Patient found wandering from her facility.  She is calm and cooperative.  Stable vitals.  Denies feeling suicidal or homicidal.  Screening labs and urinalysis will be obtained.  Discussed with social work as well as Sports coach.  Patient is at a respite facility after being discharged from inpatient psychiatry about a week ago. She is not suicidal or homicidal.  She will need to return to this facility with her caregiver today.  Discussed with patient's legal guardian Lindalou Hose.  She agrees patient can be medicated as necessary to return to her facility but no indication to try to change placement today.  Labs reassuring without evidence of electrolyte abnormality.  Urinalysis is normal.  Patient given her home medications and is calm and cooperative.  She appears to stable to return to her facility.        Final Clinical Impression(s) / ED Diagnoses Final diagnoses:  Behavior problem, adult    Rx / DC Orders ED Discharge Orders     None         Abelina Ketron, Jeannett Senior, MD 03/01/23 1126

## 2023-03-01 NOTE — Progress Notes (Signed)
CSW contacted patients legal guardian [employer], [phone] and spoke with [facility]. CSW was told that Lindalou Hose is also patients guardian with [employer]. Ms. Pollyann Kennedy stated patient is to [facility] to the [facility] with the caregiver Shanda Bumps who is with patient at bedside.    CSW spoke with Shanda Bumps, [phone] at bedside. Shanda Bumps stated patient came to her home on Tuesday 02/25/22. Shanda Bumps stated this placement is temporary and patient is only in her home for respite. Shanda Bumps stated patient has been at [facility] for the past year. Shanda Bumps is in agreement with patient returning to her home.

## 2023-03-01 NOTE — Discharge Instructions (Signed)
  Follow-up with your doctor as scheduled.  Take your medications as prescribed.  Discussed new placement options with your guardian.  Return to the ED with new or worsening symptoms

## 2023-03-28 ENCOUNTER — Encounter (HOSPITAL_COMMUNITY): Payer: Self-pay

## 2023-03-28 ENCOUNTER — Telehealth (HOSPITAL_COMMUNITY): Payer: Self-pay | Admitting: Emergency Medicine

## 2023-03-28 ENCOUNTER — Emergency Department (HOSPITAL_COMMUNITY)
Admission: EM | Admit: 2023-03-28 | Discharge: 2023-03-28 | Disposition: A | Discharge status: Home / Self Care | Payer: No Typology Code available for payment source | Attending: Emergency Medicine | Admitting: Emergency Medicine

## 2023-03-28 DIAGNOSIS — Z760 Encounter for issue of repeat prescription: Secondary | ICD-10-CM | POA: Diagnosis not present

## 2023-03-28 DIAGNOSIS — F419 Anxiety disorder, unspecified: Secondary | ICD-10-CM | POA: Diagnosis not present

## 2023-03-28 DIAGNOSIS — F5105 Insomnia due to other mental disorder: Secondary | ICD-10-CM | POA: Insufficient documentation

## 2023-03-28 DIAGNOSIS — F39 Unspecified mood [affective] disorder: Secondary | ICD-10-CM

## 2023-03-28 DIAGNOSIS — F99 Mental disorder, not otherwise specified: Secondary | ICD-10-CM | POA: Insufficient documentation

## 2023-03-28 MED ORDER — OLANZAPINE 5 MG PO TBDP
5.0000 mg | ORAL_TABLET | Freq: Once | ORAL | Status: AC
  Administered 2023-03-28: 5 mg via ORAL
  Filled 2023-03-28: qty 1

## 2023-03-28 MED ORDER — CLONAZEPAM 1 MG PO TABS: 1.0000 mg | ORAL_TABLET | Freq: Three times a day (TID) | ORAL | 0 refills | Status: DC | PRN

## 2023-03-28 MED ORDER — QUETIAPINE FUMARATE 200 MG PO TABS: 200.0000 mg | ORAL_TABLET | Freq: Every day | ORAL | 0 refills | Status: AC

## 2023-03-28 MED ORDER — OLANZAPINE 5 MG PO TBDP: ORAL_TABLET | ORAL | 0 refills | Status: AC

## 2023-03-28 MED ORDER — CLONAZEPAM 0.5 MG PO TABS
1.0000 mg | ORAL_TABLET | Freq: Once | ORAL | Status: AC
  Administered 2023-03-28: 1 mg via ORAL
  Filled 2023-03-28: qty 2

## 2023-03-28 MED ORDER — OLANZAPINE 5 MG PO TBDP: ORAL_TABLET | ORAL | 0 refills | Status: DC

## 2023-03-28 NOTE — ED Provider Notes (Signed)
[place] EMERGENCY DEPARTMENT AT [facility] Provider Note   CSN: [id number] Arrival date & time: 03/28/23  0700     History  Chief Complaint  Patient presents with   Medication Refill    Jacqueline [name] is a 57 y.o. female.  Pt with adult home caregiver who requests pt receive rx/refill of meds. Indicates pt released from [facility] to her adult home approximately 1 month ago. Indicates recently saw pcp and bh provider, and that most meds refilled, but her bh meds including seroquel, zyprexa, and klonopin were not filled - is requesting refill of same. Patient indicates was on those meds for long while and tolerated well. Indicates without the meds, increased trouble sleeping at night. No other new physical c/o. No fevers. No pain.   The history is provided by the patient, medical records and a caregiver.  Medication Refill      Home Medications Prior to Admission medications   Medication Sig Start Date End Date Taking? Authorizing Provider  acetaminophen (TYLENOL) 500 MG tablet Take 500 mg by mouth every 8 (eight) hours as needed for mild pain or moderate pain.    [provider]  acetaminophen (TYLENOL) 650 MG CR tablet Take 650 mg by mouth daily as needed for pain.    [provider]  cholecalciferol (VITAMIN D) 25 MCG (1000 UNIT) tablet Take 1,000 Units by mouth daily.    [provider]  Cyanocobalamin (VITAMIN B 12) 500 MCG TABS Take 2 tablets by mouth daily.    [provider]  diphenhydrAMINE (BENADRYL) 25 MG tablet Take 25 mg by mouth 2 (two) times daily as needed.    [provider]  ferrous sulfate 325 (65 FE) MG tablet Take 325 mg by mouth daily with breakfast.    [provider]  gabapentin (NEURONTIN) 400 MG capsule Take 2 capsules (800 mg total) by mouth 3 (three) times daily. For agitation Patient taking differently: Take 300 mg by mouth 3 (three) times daily. For agitation 10/07/15   Armandina Stammer I, NP  ibuprofen (ADVIL) 800 MG tablet Take 800 mg by mouth every 8 (eight) hours as needed.    [provider]  levothyroxine (SYNTHROID) 100 MCG tablet Take 100 mcg by mouth daily before breakfast.    [provider]  LORazepam (ATIVAN) 1 MG tablet Take 1 tablet (1 mg total) by mouth every 6 (six) hours as needed for anxiety or sedation. Patient taking differently: Take 1 mg by mouth daily as needed for anxiety or sedation.  12/12/16   Angiulli, Mcarthur Rossetti, PA-C  Lumateperone Tosylate (CAPLYTA) 42 MG CAPS Take 1 capsule by mouth daily. Patient not taking: Reported on 04/22/2020    [provider]  nicotine (NICODERM CQ - DOSED IN MG/24 HOURS) 14 mg/24hr patch Place 14 mg onto the skin daily.    [provider]  nicotine polacrilex (NICORETTE) 2 MG gum Take 4 mg by mouth every 4 (four) hours as needed for smoking cessation.    [provider]  omeprazole (PRILOSEC) 20 MG capsule Take 20 mg by mouth daily.  06/27/16   [provider]  paliperidone (INVEGA SUSTENNA) 156 MG/ML SUSY injection Inject 156 mg into the muscle every 30 (thirty) days.    [provider]  promethazine (PHENERGAN) 25 MG tablet Take 50 mg by mouth 2 (two) times daily as needed for nausea or vomiting.    [provider]  propranolol (INDERAL) 10 MG tablet Take 10 mg  by mouth daily.    [provider]  QUEtiapine (SEROQUEL) 25 MG tablet Take 25 mg by mouth at bedtime.    [provider]  risperiDONE (RISPERDAL) 2 MG tablet Take 2 mg by mouth daily.    [provider]  scopolamine (TRANSDERM-SCOP) 1 MG/3DAYS Place 1 patch onto the skin every 3 (three) days.    [provider]  selenium sulfide (SELSUN) 2.5 % shampoo Apply 1 application topically daily as needed for irritation.    [provider]  Vitamins A & D (VITAMIN A & D) ointment Apply 1 application topically 2 (two) times daily as needed for dry skin.     [provider]  furosemide (LASIX) 20 MG tablet Take 1 tablet (20 mg total) by mouth daily as needed for edema. Patient not taking: Reported on 03/13/2016 02/19/16 03/19/16  Loren Racer, MD  potassium chloride (K-DUR) 10 MEQ tablet Take 1 tablet (10 mEq total) by mouth daily. 03/19/16 03/30/16  Linwood Dibbles, MD      Allergies    Nsaids and Carbamazepine    Review of Systems   Review of Systems  Constitutional:  Negative for fever.  Respiratory:  Negative for shortness of breath.   Cardiovascular:  Negative for chest pain.  Gastrointestinal:  Negative for abdominal pain.  Neurological:  Negative for headaches.  Psychiatric/Behavioral:  The patient is nervous/anxious.     Physical Exam Updated Vital Signs BP 105/70 (BP Location: Right Arm)   Pulse 76   Temp 97.7 F (36.5 C) (Oral)   Resp 17   Ht 1.753 m (5\' 9" )   Wt 93 kg   LMP 10/09/2016 Comment: neg. pregnancy test per RN in chart  SpO2 94%   BMI 30.27 kg/m  Physical Exam Vitals and nursing note reviewed.  Constitutional:      Appearance: Normal appearance. She is well-developed.  HENT:     Head: Atraumatic.     Nose: Nose normal.     Mouth/Throat:     Mouth: Mucous membranes are moist.  Eyes:     General: No scleral icterus.    Conjunctiva/sclera: Conjunctivae normal.  Neck:     Trachea: No tracheal deviation.  Cardiovascular:     Rate and Rhythm: Normal rate and regular rhythm.     Pulses: Normal pulses.     Heart sounds: Normal heart sounds. No murmur heard.    No friction rub. No gallop.  Pulmonary:     Effort: Pulmonary effort is normal. No respiratory distress.     Breath sounds: Normal breath sounds.  Abdominal:     General: There is no distension.     Palpations: Abdomen is soft.     Tenderness: There is no abdominal tenderness.  Musculoskeletal:        General: No swelling.     Cervical back: Normal range of motion and neck supple. No rigidity. No muscular tenderness.  Skin:    General:  Skin is warm and dry.     Findings: No rash.  Neurological:     Mental Status: She is alert.     Comments: Alert, speech normal. Steady gait.   Psychiatric:     Comments: Pt appears mildly anxious and restless.      ED Results / Procedures / Treatments   Labs (all labs ordered are listed, but only abnormal results are displayed) Labs Reviewed - No data to display  EKG None  Radiology No results found.  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  Risk Prescription drug management.   Reviewed nursing notes and prior charts for additional history.   Pt given dose of klonopin and zyprexa in ED.   MAR from facility reviewed/verified.  Will give rx klonopin 1 mg tid prn, seroquel 200 mg at bedtime, and zyprexa 5 mg in am, 10 mg at bedtime.  Will provide 1 month rx supply. Will need to f/u pcp and bh provider for further refills.   Pt currently appears stable for d/c.           Final Clinical Impression(s) / ED Diagnoses Final diagnoses:  None    Rx / DC Orders ED Discharge Orders     None         Cathren Laine, MD 03/28/23 [phone]

## 2023-03-28 NOTE — Discharge Instructions (Addendum)
It was our pleasure to provide your ER care today - we hope that you feel better.  Take your medications as prescribed.   Follow up with primary care doctor/behavioral health provider in the coming month for recheck, review of meds, and further refill of meds as need.   Return to ER if worse, new symptoms, fevers, new/severe pain, trouble breathing, or other concern.

## 2023-03-28 NOTE — ED Triage Notes (Signed)
Pt arrives via POV with her caregiver who states that pt was discharged from [facility] on 02/21/23 and she recently took patient to a new PCP and states "he wouldn't refill any of her medications."

## 2023-03-28 NOTE — Telephone Encounter (Signed)
Pt requests different pharmacy for zyprexa  rx - done.

## 2023-04-02 ENCOUNTER — Emergency Department (HOSPITAL_COMMUNITY)
Admission: EM | Admit: 2023-04-02 | Discharge: 2023-04-02 | Disposition: A | Discharge status: Home / Self Care | Payer: Medicaid Other | Attending: Emergency Medicine | Admitting: Emergency Medicine

## 2023-04-02 ENCOUNTER — Encounter (HOSPITAL_COMMUNITY): Payer: Self-pay | Admitting: Emergency Medicine

## 2023-04-02 ENCOUNTER — Other Ambulatory Visit: Payer: Self-pay

## 2023-04-02 DIAGNOSIS — Z23 Encounter for immunization: Secondary | ICD-10-CM | POA: Insufficient documentation

## 2023-04-02 DIAGNOSIS — T23131A Burn of first degree of multiple right fingers (nail), not including thumb, initial encounter: Secondary | ICD-10-CM | POA: Diagnosis present

## 2023-04-02 DIAGNOSIS — G8929 Other chronic pain: Secondary | ICD-10-CM | POA: Insufficient documentation

## 2023-04-02 DIAGNOSIS — X158XXA Contact with other hot household appliances, initial encounter: Secondary | ICD-10-CM | POA: Diagnosis not present

## 2023-04-02 DIAGNOSIS — M25561 Pain in right knee: Secondary | ICD-10-CM | POA: Insufficient documentation

## 2023-04-02 DIAGNOSIS — T23039A Burn of unspecified degree of unspecified multiple fingers (nail), not including thumb, initial encounter: Secondary | ICD-10-CM

## 2023-04-02 MED ORDER — TETANUS-DIPHTH-ACELL PERTUSSIS 5-2.5-18.5 LF-MCG/0.5 IM SUSY
0.5000 mL | PREFILLED_SYRINGE | Freq: Once | INTRAMUSCULAR | Status: AC
  Administered 2023-04-02: 0.5 mL via INTRAMUSCULAR
  Filled 2023-04-02: qty 0.5

## 2023-04-02 MED ORDER — BACITRACIN ZINC 500 UNIT/GM EX OINT
TOPICAL_OINTMENT | Freq: Two times a day (BID) | CUTANEOUS | Status: DC
  Administered 2023-04-02: 1 via TOPICAL
  Filled 2023-04-02: qty 0.9

## 2023-04-02 MED ORDER — BACITRACIN ZINC 500 UNIT/GM EX OINT: TOPICAL_OINTMENT | CUTANEOUS | 0 refills | Status: DC

## 2023-04-02 NOTE — Discharge Instructions (Addendum)
Go back to taking your gabapentin 300 mg twice a day and 600 mg at bedtime.  You may contact the orthopedic provider listed to arrange follow-up appointment regarding your knee pain.  I have also printed out a resource list you may contact to establish care with a psychiatrist.
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
## 2023-04-02 NOTE — ED Notes (Signed)
Medicated per [facility] 15 mins then D/C
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
## 2023-04-25 ENCOUNTER — Other Ambulatory Visit (INDEPENDENT_AMBULATORY_CARE_PROVIDER_SITE_OTHER): Payer: MEDICAID

## 2023-04-25 ENCOUNTER — Encounter: Payer: Self-pay | Admitting: Orthopedic Surgery

## 2023-04-25 ENCOUNTER — Ambulatory Visit: Payer: MEDICAID | Admitting: Orthopedic Surgery

## 2023-04-25 VITALS — BP 104/70 | HR 85 | Ht 69.0 in | Wt 211.4 lb

## 2023-04-25 DIAGNOSIS — G8929 Other chronic pain: Secondary | ICD-10-CM

## 2023-04-25 DIAGNOSIS — M1711 Unilateral primary osteoarthritis, right knee: Secondary | ICD-10-CM

## 2023-04-25 DIAGNOSIS — M25561 Pain in right knee: Secondary | ICD-10-CM | POA: Diagnosis not present

## 2023-04-25 MED ORDER — MELOXICAM 7.5 MG PO TABS
7.5000 mg | ORAL_TABLET | Freq: Two times a day (BID) | ORAL | 5 refills | Status: AC
Start: 2023-04-25 — End: ?
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
## 2023-06-06 ENCOUNTER — Ambulatory Visit: Payer: MEDICAID | Admitting: Orthopedic Surgery

## 2023-06-06 ENCOUNTER — Encounter: Payer: Self-pay | Admitting: Orthopedic Surgery

## 2023-06-06 VITALS — BP 108/74 | HR 70 | Ht 69.0 in | Wt 227.0 lb

## 2023-06-06 DIAGNOSIS — M1711 Unilateral primary osteoarthritis, right knee: Secondary | ICD-10-CM
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
## 2023-06-23 ENCOUNTER — Emergency Department (HOSPITAL_COMMUNITY)
Admission: EM | Admit: 2023-06-23 | Discharge: 2023-06-23 | Disposition: A | Discharge status: Home / Self Care | Payer: MEDICAID | Attending: Emergency Medicine | Admitting: Emergency Medicine

## 2023-06-23 ENCOUNTER — Other Ambulatory Visit: Payer: Self-pay

## 2023-06-23 ENCOUNTER — Emergency Department (HOSPITAL_COMMUNITY): Payer: MEDICAID

## 2023-06-23 ENCOUNTER — Encounter (HOSPITAL_COMMUNITY): Payer: Self-pay | Admitting: *Deleted

## 2023-06-23 DIAGNOSIS — E039 Hypothyroidism, unspecified: Secondary | ICD-10-CM | POA: Insufficient documentation

## 2023-06-23 DIAGNOSIS — Z87891 Personal history of nicotine dependence: Secondary | ICD-10-CM | POA: Insufficient documentation

## 2023-06-23 DIAGNOSIS — S80212A Abrasion, left knee, initial encounter: Secondary | ICD-10-CM | POA: Diagnosis not present

## 2023-06-23 DIAGNOSIS — W010XXA Fall on same level from slipping, tripping and stumbling without subsequent striking against object, initial encounter: Secondary | ICD-10-CM | POA: Diagnosis not present

## 2023-06-23 DIAGNOSIS — S8992XA Unspecified injury of left lower leg, initial encounter: Secondary | ICD-10-CM | POA: Diagnosis present

## 2023-06-23 DIAGNOSIS — F419 Anxiety disorder, unspecified: Secondary | ICD-10-CM

## 2023-06-23 MED ORDER — CLONAZEPAM 0.5 MG PO TABS: 1.0000 mg | ORAL_TABLET | Freq: Three times a day (TID) | ORAL | 0 refills | Status: AC | PRN
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
## 2023-06-23 NOTE — ED Triage Notes (Signed)
Pt tripped today and fell landing on left knee.  Denies hitting her head.  Abrasion to left knee.  Pt's caregiver states pt has had her medication adjusted and pt has been more irritable.

## 2023-06-23 NOTE — Discharge Instructions (Signed)
It was a pleasure caring for you today in the emergency department.  Please return to the emergency department for any worsening or worrisome symptoms.  Please follow-up with your primary care provider in regards to Klonopin dosing.  Your knee x-ray did not show any evidence of fracture.  Please keep the abrasion clean, wash with gentle soap and water twice daily.  Keep covered with bandage

## 2023-07-11 ENCOUNTER — Encounter (HOSPITAL_COMMUNITY): Payer: Self-pay

## 2023-07-11 ENCOUNTER — Emergency Department (HOSPITAL_COMMUNITY)
Admission: EM | Admit: 2023-07-11 | Discharge: 2023-08-29 | Disposition: A | Discharge status: Home / Self Care | Payer: MEDICAID | Attending: Emergency Medicine | Admitting: Emergency Medicine

## 2023-07-11 ENCOUNTER — Other Ambulatory Visit: Payer: Self-pay

## 2023-07-11 DIAGNOSIS — R456 Violent behavior: Secondary | ICD-10-CM | POA: Insufficient documentation

## 2023-07-11 DIAGNOSIS — F32A Depression, unspecified: Secondary | ICD-10-CM | POA: Diagnosis present

## 2023-07-11 DIAGNOSIS — F0789 Other personality and behavioral disorders due to known physiological condition: Secondary | ICD-10-CM | POA: Insufficient documentation

## 2023-07-11 DIAGNOSIS — F99 Mental disorder, not otherwise specified: Secondary | ICD-10-CM | POA: Diagnosis not present

## 2023-07-11 DIAGNOSIS — S069XAS Unspecified intracranial injury with loss of consciousness status unknown, sequela: Secondary | ICD-10-CM | POA: Diagnosis not present

## 2023-07-11 DIAGNOSIS — E039 Hypothyroidism, unspecified: Secondary | ICD-10-CM | POA: Insufficient documentation

## 2023-07-11 DIAGNOSIS — F308 Other manic episodes: Secondary | ICD-10-CM | POA: Insufficient documentation

## 2023-07-11 DIAGNOSIS — R4689 Other symptoms and signs involving appearance and behavior: Secondary | ICD-10-CM

## 2023-07-11 LAB — COMPREHENSIVE METABOLIC PANEL
ALT: 16 U/L (ref 0–44)
AST: 15 U/L (ref 15–41)
Albumin: 3.9 g/dL (ref 3.5–5.0)
Alkaline Phosphatase: 70 U/L (ref 38–126)
Anion gap: 7 (ref 5–15)
BUN: 17 mg/dL (ref 6–20)
CO2: 25 mmol/L (ref 22–32)
Calcium: 8.9 mg/dL (ref 8.9–10.3)
Chloride: 105 mmol/L (ref 98–111)
Creatinine, Ser: 0.93 mg/dL (ref 0.44–1.00)
GFR, Estimated: >60 mL/min (ref >60)
Glucose, Bld: 78 mg/dL (ref 70–99)
Potassium: 4.2 mmol/L (ref 3.5–5.1)
Sodium: 137 mmol/L (ref 135–145)
Total Bilirubin: 0.3 mg/dL (ref 0.3–1.2)
Total Protein: 6.9 g/dL (ref 6.5–8.1)

## 2023-07-11 LAB — CBC WITH DIFFERENTIAL/PLATELET
Abs Immature Granulocytes: 0.01 10*3/uL (ref 0.00–0.07)
Basophils Absolute: 0 10*3/uL (ref 0.0–0.1)
Basophils Relative: 1 %
Eosinophils Absolute: 0.3 10*3/uL (ref 0.0–0.5)
Eosinophils Relative: 6 %
HCT: 34 % — ABNORMAL LOW (ref 36.0–46.0)
Hemoglobin: 11.5 g/dL — ABNORMAL LOW (ref 12.0–15.0)
Immature Granulocytes: 0 %
Lymphocytes Relative: 28 %
Lymphs Abs: 1.5 10*3/uL (ref 0.7–4.0)
MCH: 33.4 pg (ref 26.0–34.0)
MCHC: 33.8 g/dL (ref 30.0–36.0)
MCV: 98.8 fL (ref 80.0–100.0)
Monocytes Absolute: 0.8 10*3/uL (ref 0.1–1.0)
Monocytes Relative: 15 %
Neutro Abs: 2.7 10*3/uL (ref 1.7–7.7)
Neutrophils Relative %: 50 %
Platelets: 273 10*3/uL (ref 150–400)
RBC: 3.44 MIL/uL — ABNORMAL LOW (ref 3.87–5.11)
RDW: 13.4 % (ref 11.5–15.5)
WBC: 5.4 10*3/uL (ref 4.0–10.5)
nRBC: 0 % (ref 0.0–0.2)

## 2023-07-11 LAB — RAPID URINE DRUG SCREEN, HOSP PERFORMED
Amphetamines: NOT DETECTED
Barbiturates: NOT DETECTED
Benzodiazepines: NOT DETECTED
Cocaine: NOT DETECTED
Opiates: NOT DETECTED
Tetrahydrocannabinol: NOT DETECTED

## 2023-07-11 LAB — URINALYSIS, ROUTINE W REFLEX MICROSCOPIC
Bilirubin Urine: NEGATIVE
Glucose, UA: NEGATIVE mg/dL
Hgb urine dipstick: NEGATIVE
Ketones, ur: NEGATIVE mg/dL
Leukocytes,Ua: NEGATIVE
Nitrite: NEGATIVE
Protein, ur: NEGATIVE mg/dL
Specific Gravity, Urine: 1.004 — ABNORMAL LOW (ref 1.005–1.030)
pH: 6 (ref 5.0–8.0)

## 2023-07-11 LAB — SALICYLATE LEVEL: Salicylate Lvl: <7 mg/dL — ABNORMAL LOW (ref 7.0–30.0)

## 2023-07-11 LAB — ACETAMINOPHEN LEVEL: Acetaminophen (Tylenol), Serum: <10 ug/mL — ABNORMAL LOW (ref 10–30)

## 2023-07-11 LAB — ETHANOL: Alcohol, Ethyl (B): <10 mg/dL (ref <10)

## 2023-07-11 NOTE — ED Provider Notes (Signed)
AP-EMERGENCY DEPT [facility] Emergency Department Provider Note MRN:  [id number]  Arrival date & time: 07/12/23     Chief Complaint   Psychiatric Evaluation   History of Present Illness   Jacqueline [name] is a 57 y.o. year-old female with a history of depression presenting to the ED with chief complaint of psychiatric evaluation.  Medication change about 3 months ago, mood change about 3 weeks ago.  Much more irritable, angry, unpleasant.  Becoming more and more potentially violent, threatening to kill caregiver yesterday, threatening to kill self today.  Review of Systems  A thorough review of systems was obtained and all systems are negative except as noted in the HPI and PMH.   Patient's Health History    Past Medical History:  Diagnosis Date   Alcohol abuse    Alcoholism (HCC)    Anemia    Anxiety    Back pain    Benzodiazepine abuse (HCC)    Depression    Hypoglycemia    Hypothyroidism    Narcotic abuse (HCC)    Peptic ulcer    Seizures (HCC)    Suicide attempt (HCC)    multiple times   Thrombocytopenia (HCC) 06/17/2011   Thyroid disease     Past Surgical History:  Procedure Laterality Date   ABDOMINAL SURGERY     CHOLECYSTECTOMY     COLONOSCOPY N/A 10/02/2016   Procedure: COLONOSCOPY;  Surgeon: Sherrilyn Rist, MD;  Location: [facility] ENDOSCOPY;  Service: Gastroenterology;  Laterality: N/A;   ESOPHAGOGASTRODUODENOSCOPY     ESOPHAGOGASTRODUODENOSCOPY N/A 03/23/2013   Procedure: ESOPHAGOGASTRODUODENOSCOPY (EGD);  Surgeon: Meryl Dare, MD;  Location: [facility] ENDOSCOPY;  Service: Endoscopy;  Laterality: N/A;   ESOPHAGOGASTRODUODENOSCOPY N/A 09/18/2016   Procedure: ESOPHAGOGASTRODUODENOSCOPY (EGD);  Surgeon: Violeta Gelinas, MD;  Location: [facility] ENDOSCOPY;  Service: General;  Laterality: N/A;   ESOPHAGOGASTRODUODENOSCOPY N/A 10/01/2016   Procedure: ESOPHAGOGASTRODUODENOSCOPY (EGD);  Surgeon: Jeani Hawking, MD;  Location: [facility] ENDOSCOPY;  Service: Endoscopy;  Laterality:  N/A;   GASTRIC BYPASS     PARTIAL COLECTOMY Right 10/03/2016   Procedure: PARTIAL COLECTOMY;  Surgeon: Jimmye Norman, MD;  Location: [facility] OR;  Service: General;  Laterality: Right;   PERCUTANEOUS TRACHEOSTOMY N/A 09/18/2016   Procedure: PERCUTANEOUS TRACHEOSTOMY;  Surgeon: Violeta Gelinas, MD;  Location: [facility] OR;  Service: General;  Laterality: N/A;   TUBAL LIGATION      Family History  Problem Relation Age of Onset   Alcohol abuse Father    Alcoholism Father    Cancer Other     Social History   Socioeconomic History   Marital status: Single    Spouse name: Not on file   Number of children: Not on file   Years of education: Not on file   Highest education level: Not on file  Occupational History   Not on file  Tobacco Use   Smoking status: Former    Current packs/day: 0.50    Types: Cigarettes   Smokeless tobacco: Never  Substance and Sexual Activity   Alcohol use: No    Comment: former   Drug use: Yes    Types: Oxycodone, Cocaine, Benzodiazepines    Comment: former   Sexual activity: Yes    Birth control/protection: Surgical  Other Topics Concern   Not on file  Social History Narrative   ** Merged History Encounter **       ** Merged History Encounter **       Social Determinants of Health   Financial Resource Strain: Not on  file  Food Insecurity: Not on file  Transportation Needs: Not on file  Physical Activity: Not on file  Stress: Not on file  Social Connections: Not on file  Intimate Partner Violence: Not on file     Physical Exam   Vitals:   07/11/23 2116 07/11/23 2341  BP: 112/66 135/83  Pulse: 66 60  Resp: 18 16  Temp: 98 F (36.7 C) 98 F (36.7 C)  SpO2: 98% 96%    CONSTITUTIONAL: Chronically ill-appearing, anxious, rocking back and forth NEURO/PSYCH:  Alert and oriented x 3, no focal deficits EYES:  eyes equal and reactive ENT/NECK:  no LAD, no JVD CARDIO: Regular rate, well-perfused, normal S1 and S2 PULM:  CTAB no wheezing or  rhonchi GI/GU:  non-distended, non-tender MSK/SPINE:  No gross deformities, no edema SKIN:  no rash, atraumatic   *Additional and/or pertinent findings included in MDM below  Diagnostic and Interventional Summary    EKG Interpretation Date/Time:    Ventricular Rate:    PR Interval:    QRS Duration:    QT Interval:    QTC Calculation:   R Axis:      Text Interpretation:         Labs Reviewed  CBC WITH DIFFERENTIAL/PLATELET - Abnormal; Notable for the following components:      Result Value   RBC 3.44 (*)    Hemoglobin 11.5 (*)    HCT 34.0 (*)    All other components within normal limits  ACETAMINOPHEN LEVEL - Abnormal; Notable for the following components:   Acetaminophen (Tylenol), Serum <10 (*)    All other components within normal limits  SALICYLATE LEVEL - Abnormal; Notable for the following components:   Salicylate Lvl <7.0 (*)    All other components within normal limits  URINALYSIS, ROUTINE W REFLEX MICROSCOPIC - Abnormal; Notable for the following components:   Color, Urine STRAW (*)    Specific Gravity, Urine 1.004 (*)    All other components within normal limits  COMPREHENSIVE METABOLIC PANEL  ETHANOL  RAPID URINE DRUG SCREEN, HOSP PERFORMED    No orders to display    Medications - No data to display   Procedures  /  Critical Care Procedures  ED Course and Medical Decision Making  Initial Impression and Ddx Will need TTS evaluation for unstable psychiatric condition.  Currently denies any SI or HI or AVH, IVC not indicated at this time.  Patient is willing to stay and be evaluated.  Medically cleared.  Past medical/surgical history that increases complexity of ED encounter: Depression  Interpretation of Diagnostics I personally reviewed the laboratory assessment and my interpretation is as follows: No significant blood count or electrolyte disturbance    Patient Reassessment and Ultimate Disposition/Management     TTS recommending inpatient  management.  Patient agrees to stay voluntarily, however if she were to try to leave she would need IVC given the recommendations.  Signed out to default provider.  Patient management required discussion with the following services or consulting groups:  Psychiatry/TTS  Complexity of Problems Addressed Acute illness or injury that poses threat of life of bodily function  Additional Data Reviewed and Analyzed Further history obtained from: Further history from spouse/family member  Additional Factors Impacting ED Encounter Risk Consideration of hospitalization  Elmer Sow. Pilar Plate, MD [facility] Emergency Medicine [facility] [email or web address]  Final Clinical Impressions(s) / ED Diagnoses     ICD-10-CM   1. Depression, unspecified depression type  F32.A     2.  Aggressive behavior  R46.89       ED Discharge Orders     None        Discharge Instructions Discussed with and Provided to Patient:   Discharge Instructions   None      Sabas Sous, MD 07/12/23 [phone]

## 2023-07-11 NOTE — ED Triage Notes (Addendum)
Pt brought in with guardian and states "she brought me in here and I don't want want to be here. She says I said I want to kill myself and I don't."  The guardian states "she told me she wanted to kill me on Sunday." Per guardian they changed her meds in August and she has been more irritable and restless since.  Pt denies si/hi at this time

## 2023-07-11 NOTE — ED Notes (Signed)
Pt changed into burgundy scrubs and belongings put in locker #1. Pt given diet ginger ale and graham crackers as requested.

## 2023-07-12 DIAGNOSIS — S069XAS Unspecified intracranial injury with loss of consciousness status unknown, sequela: Secondary | ICD-10-CM

## 2023-07-12 DIAGNOSIS — F99 Mental disorder, not otherwise specified: Secondary | ICD-10-CM | POA: Diagnosis not present

## 2023-07-12 DIAGNOSIS — F0280 Dementia in other diseases classified elsewhere without behavioral disturbance: Secondary | ICD-10-CM

## 2023-07-12 MED ORDER — ALBUTEROL SULFATE HFA 108 (90 BASE) MCG/ACT IN AERS: 2.0000 | INHALATION_SPRAY | RESPIRATORY_TRACT | Status: DC | PRN

## 2023-07-12 MED ORDER — DIVALPROEX SODIUM 250 MG PO DR TAB
500.0000 mg | DELAYED_RELEASE_TABLET | Freq: Every day | ORAL | Status: DC
  Administered 2023-07-12 – 2023-07-23 (×12): 500 mg via ORAL
  Filled 2023-07-12 (×12): qty 2

## 2023-07-12 MED ORDER — LEVOTHYROXINE SODIUM 50 MCG PO TABS: 125.0000 ug | ORAL_TABLET | Freq: Every day | ORAL | Status: DC

## 2023-07-12 MED ORDER — SODIUM CHLORIDE 1 G PO TABS
1.0000 g | ORAL_TABLET | Freq: Three times a day (TID) | ORAL | Status: DC
  Administered 2023-07-12 – 2023-08-29 (×136): 1 g via ORAL
  Filled 2023-07-12 (×163): qty 1

## 2023-07-12 MED ORDER — OLANZAPINE 5 MG PO TBDP
5.0000 mg | ORAL_TABLET | Freq: Three times a day (TID) | ORAL | Status: DC | PRN
  Administered 2023-07-12 – 2023-07-23 (×23): 5 mg via ORAL
  Filled 2023-07-12 (×24): qty 1

## 2023-07-12 MED ORDER — HYDROXYZINE HCL 25 MG PO TABS
25.0000 mg | ORAL_TABLET | Freq: Three times a day (TID) | ORAL | Status: DC
  Administered 2023-07-12 – 2023-08-29 (×144): 25 mg via ORAL
  Filled 2023-07-12 (×147): qty 1

## 2023-07-12 MED ORDER — MELOXICAM 7.5 MG PO TABS
7.5000 mg | ORAL_TABLET | Freq: Two times a day (BID) | ORAL | Status: DC
  Administered 2023-07-12 – 2023-08-29 (×93): 7.5 mg via ORAL
  Filled 2023-07-12 (×112): qty 1

## 2023-07-12 MED ORDER — POLYETHYLENE GLYCOL 3350 17 G PO PACK
17.0000 g | PACK | Freq: Every day | ORAL | Status: DC
  Administered 2023-07-12 – 2023-08-28 (×42): 17 g via ORAL
  Filled 2023-07-12 (×43): qty 1

## 2023-07-12 MED ORDER — FLUTICASONE PROPIONATE 50 MCG/ACT NA SUSP
1.0000 | Freq: Every morning | NASAL | Status: DC
  Administered 2023-07-12 – 2023-08-29 (×46): 1 via NASAL
  Filled 2023-07-12 (×4): qty 16

## 2023-07-12 MED ORDER — FOLIC ACID 1 MG PO TABS
1.0000 mg | ORAL_TABLET | Freq: Every morning | ORAL | Status: DC
  Administered 2023-07-12 – 2023-08-29 (×49): 1 mg via ORAL
  Filled 2023-07-12 (×49): qty 1

## 2023-07-12 MED ORDER — QUETIAPINE FUMARATE 200 MG PO TABS
200.0000 mg | ORAL_TABLET | Freq: Every day | ORAL | Status: DC
  Administered 2023-07-12 – 2023-08-28 (×49): 200 mg via ORAL
  Filled 2023-07-12: qty 1
  Filled 2023-07-12: qty 2
  Filled 2023-07-12: qty 1
  Filled 2023-07-12 (×21): qty 2
  Filled 2023-07-12: qty 1
  Filled 2023-07-12 (×3): qty 2
  Filled 2023-07-12: qty 1
  Filled 2023-07-12 (×11): qty 2
  Filled 2023-07-12: qty 1
  Filled 2023-07-12 (×3): qty 2
  Filled 2023-07-12: qty 8
  Filled 2023-07-12 (×4): qty 2

## 2023-07-12 MED ORDER — GABAPENTIN 300 MG PO CAPS
300.0000 mg | ORAL_CAPSULE | Freq: Every day | ORAL | Status: DC
  Administered 2023-07-13 – 2023-08-29 (×48): 300 mg via ORAL
  Filled 2023-07-12: qty 3
  Filled 2023-07-12 (×9): qty 1
  Filled 2023-07-12: qty 3
  Filled 2023-07-12 (×36): qty 1

## 2023-07-12 MED ORDER — GABAPENTIN 300 MG PO CAPS
300.0000 mg | ORAL_CAPSULE | Freq: Two times a day (BID) | ORAL | Status: DC
  Administered 2023-07-12: 600 mg via ORAL
  Administered 2023-07-12: 300 mg via ORAL
  Filled 2023-07-12: qty 2
  Filled 2023-07-12: qty 1

## 2023-07-12 MED ORDER — DIVALPROEX SODIUM 250 MG PO DR TAB
1500.0000 mg | DELAYED_RELEASE_TABLET | Freq: Every evening | ORAL | Status: DC | PRN
  Administered 2023-07-12 – 2023-07-18 (×4): 1500 mg via ORAL
  Filled 2023-07-12 (×3): qty 6

## 2023-07-12 MED ORDER — GABAPENTIN 300 MG PO CAPS
600.0000 mg | ORAL_CAPSULE | Freq: Every day | ORAL | Status: DC
  Administered 2023-07-12 – 2023-08-28 (×48): 600 mg via ORAL
  Filled 2023-07-12 (×48): qty 2

## 2023-07-12 MED ORDER — LEVOTHYROXINE SODIUM 25 MCG PO TABS
125.0000 ug | ORAL_TABLET | Freq: Every day | ORAL | Status: DC
  Administered 2023-07-12 – 2023-08-29 (×49): 125 ug via ORAL
  Filled 2023-07-12 (×5): qty 3
  Filled 2023-07-12: qty 1
  Filled 2023-07-12 (×7): qty 3
  Filled 2023-07-12: qty 1
  Filled 2023-07-12: qty 3
  Filled 2023-07-12: qty 1
  Filled 2023-07-12 (×4): qty 3
  Filled 2023-07-12: qty 1
  Filled 2023-07-12 (×13): qty 3
  Filled 2023-07-12: qty 1
  Filled 2023-07-12 (×10): qty 3
  Filled 2023-07-12: qty 1
  Filled 2023-07-12 (×3): qty 3

## 2023-07-12 MED ORDER — SENNA 8.6 MG PO TABS
2.0000 | ORAL_TABLET | Freq: Every day | ORAL | Status: DC
  Administered 2023-07-12 – 2023-08-28 (×41): 17.2 mg via ORAL
  Filled 2023-07-12 (×40): qty 2

## 2023-07-12 MED ORDER — DIVALPROEX SODIUM 500 MG PO DR TAB
1500.0000 mg | DELAYED_RELEASE_TABLET | Freq: Every day | ORAL | Status: DC
  Administered 2023-07-12 – 2023-08-28 (×45): 1500 mg via ORAL
  Filled 2023-07-12 (×4): qty 6
  Filled 2023-07-12: qty 3
  Filled 2023-07-12 (×7): qty 6
  Filled 2023-07-12: qty 3
  Filled 2023-07-12 (×2): qty 6
  Filled 2023-07-12: qty 3
  Filled 2023-07-12 (×6): qty 6
  Filled 2023-07-12: qty 3
  Filled 2023-07-12: qty 6
  Filled 2023-07-12: qty 3
  Filled 2023-07-12 (×3): qty 6
  Filled 2023-07-12: qty 3
  Filled 2023-07-12: qty 6
  Filled 2023-07-12: qty 3
  Filled 2023-07-12 (×8): qty 6
  Filled 2023-07-12: qty 3
  Filled 2023-07-12 (×6): qty 6
  Filled 2023-07-12: qty 3
  Filled 2023-07-12 (×2): qty 6
  Filled 2023-07-12: qty 3
  Filled 2023-07-12: qty 6

## 2023-07-12 MED ORDER — ESCITALOPRAM OXALATE 10 MG PO TABS
10.0000 mg | ORAL_TABLET | Freq: Every day | ORAL | Status: DC
  Administered 2023-07-12 – 2023-08-29 (×49): 10 mg via ORAL
  Filled 2023-07-12 (×50): qty 1

## 2023-07-12 MED ORDER — PANTOPRAZOLE SODIUM 40 MG PO TBEC
40.0000 mg | DELAYED_RELEASE_TABLET | Freq: Two times a day (BID) | ORAL | Status: DC
  Administered 2023-07-12 – 2023-08-29 (×98): 40 mg via ORAL
  Filled 2023-07-12 (×99): qty 1

## 2023-07-12 MED ORDER — ACETAMINOPHEN 325 MG PO TABS
650.0000 mg | ORAL_TABLET | Freq: Every day | ORAL | Status: DC | PRN
  Administered 2023-07-16: 650 mg via ORAL
  Filled 2023-07-12 (×2): qty 2

## 2023-07-12 MED ORDER — CLONAZEPAM 0.5 MG PO TABS
1.0000 mg | ORAL_TABLET | Freq: Three times a day (TID) | ORAL | Status: DC | PRN
  Administered 2023-07-12 – 2023-08-28 (×79): 1 mg via ORAL
  Filled 2023-07-12 (×79): qty 2

## 2023-07-12 NOTE — ED Notes (Signed)
Pt given a sandwich and denture cup for dentures.

## 2023-07-12 NOTE — ED Notes (Signed)
Pt wanded by security. 

## 2023-07-12 NOTE — TOC Progression Note (Addendum)
Transition of Care [facility]) - Progression Note    Patient Details  Name: Jacqueline [name] MRN: [id number] Date of Birth: 21-Sep-1966  Transition of [facility] CM/SW Contact  Erin Sons, [place] Phone Number: 07/12/2023, 9:45am Clinical Narrative:     CSW called  Cassandra, for [employer] [phone]). Informed her that pt is cleared for DC. She states either Kennyth Arnold or Lauris Poag will be calling CSW. CSW provided contact info.   1155: CSW called [employer] again([phone]) and spoke with a Insurance claims handler. CSW explained that pt is cleared for DC and inquired about who will be picking pt up. She took CSW's contact info again and stated that someone will call CSW.   1200: CSW received call from [place] with [employer], [phone] ext [phone]. Or [phone]. [employer] agency is acting as pt's legal guardian. Lauris Poag states that pt cannot return to her group home and that she is waiting to hear back from pt's care manager, Rosario Adie, with [facility] health about placement for pt. CSW inquired about reason for group home not taking pt back and who Lauris Poag spoke to at group home. Lauris Poag has not spoken to group home manager and it is unclear where the information about group home not taking pt back is coming from. She just explains that pt was having issues at the group home and she thinks pt cannot return. CSW requested to get a clearer answer from group home regarding pt being able to return; Lauris Poag reiterated that she is waiting to hear from pt's care manager.   CSW called group home manager, Glennon Mac; no answer and no voicemail box available.   1435: CSW called group home manager again, no answer and no voicemail box available.   1535: CSW called [employer] for wellness check at group home.

## 2023-07-12 NOTE — BH Assessment (Signed)
TTS consult will be completed IRIS. IRIS coordinator will notify of assessment time and provider in established secure chat.

## 2023-07-12 NOTE — ED Notes (Signed)
Another patient told patient to "shut the fuck up"  Patient states "who is he talking to" A blind placed between the two and security called.  Patient is talking to sitter at this time

## 2023-07-12 NOTE — ED Notes (Signed)
Pt given another sandwich and asked to be quiet and not disturb the other pts.

## 2023-07-12 NOTE — ED Notes (Addendum)
Continued mania, restlessness, agitation, impulsive, loud. Difficulty following requests.

## 2023-07-12 NOTE — Consult Note (Signed)
[facility] Consult Note  Patient Name: Jacqueline [name] MRN: [id number] DOB: Oct 25, 1965 DATE OF Consult: 07/12/2023  PRIMARY PSYCHIATRIC DIAGNOSES  1.  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  Depression, unspecified    RECOMMENDATIONS  Recommendations: Medication recommendations: Please reconcile and continue patient's home meds; Recommend olanzapine 5mg  po TID PRN agitation  Non-Medication/therapeutic recommendations: Recommend social worker speak to guardian and caregiver to help determine if patient needs alternative placement if caregiver cannot mange patient. Guardian to call ED in the AM Is inpatient psychiatric hospitalization recommended for this patient? No (Explain why): Patient with impulsivity secondary to major neurocognitive disorder with behavioral disturbance, limited cognitive flexibility, poor distress tolerance. Patient acted out but did not attempt to harm herself or caregiver Follow-Up Telepsychiatry C/L services: We will sign off for now. Please re-consult our service if needed for any concerning changes in the patient's condition, discharge planning, or questions. Communication: Treatment team members (and family members if applicable) who were involved in treatment/care discussions and planning, and with whom we spoke or engaged with via secure text/chat, include the following: Dr. Pilar Plate; Debbe Odea; Berna Spare  Thank you for involving [name] in the care of this patient. If you have any additional questions or concerns, please call [phone] and ask for me or the provider on-call.  TELEPSYCHIATRY ATTESTATION & CONSENT  As the provider for this telehealth consult, I attest that I verified the patient's identity using two separate identifiers, introduced myself to the patient, provided my credentials, disclosed my location, and performed this encounter via a HIPAA-compliant, real-time, face-to-face, two-way, interactive audio and video  platform and with the full consent and agreement of the patient (or guardian as applicable.)  Patient physical location: ED in [facility]  Telehealth provider physical location: home office in state of [place]   Video start time: [phone] AM EST Video end time: 0558 AM EST  IDENTIFYING DATA  Jacqueline [name] is a 57 y.o. year-old female for whom a psychiatric consultation has been ordered by the primary provider. The patient was identified using two separate identifiers.  CHIEF COMPLAINT/REASON FOR CONSULT  Suicidal and homicidal ideation   HISTORY OF PRESENT ILLNESS (HPI)  Jacqueline [name] is a 57 year old female with a history of depression, anxiety, stimulant, alcohol, opioid, and benzo use disorders, major neurocognitive disorder due to TBI with behavioral disturbance, chronic pain, hypothyroidism, seizures, attempts who presents to the ED with her guardian who reports patient has been making suicidal and homicidal statements. UDS negative. Psychiatry consulted for disposition recommendations.   Patient difficult to understand, child like, circumstantial, psychomotor agitated, concrete, not internally preoccupied, not responding to internal stimuli, alert and oriented x 4, a limited historian. Patient reports she got into an argument with her caregiver and the caregiver  said to her, "You're driving me crazy I want you out of here!" She states she asked the caregiver for a Coca Cola yesterday and the caregiver got upset. Patient states she said to her caregiver, "I''m going to hurt myself if you don't give me a cola." She states, "I didn't mean it. I was just upset." She denies that she had suicidal intent. Denies current suicidal ideation, intent, and plan. Patient denies that she threatened to kill caregiver. Patient reports she has been living with caregiver for 4 months. Patient reports she has been more irritable lately, does not know why. Patient endorses depressed mood because she  cannot see her family. Reports she has felt depressed for a month.  Denies other depressive symptoms. Reports she likes listening to music. Denies symptoms consistent with mania/hypomania, paranoia, auditory and visual hallucinations, homicidal ideation. States her guardian tells her she cannot speak to her mother or her children because "it will make me sad." States she attends a program 5 days a week where she participate in programming, plays game, does crafts. Patient then asks this Clinical research associate, "are you my guardian?" Patient does not know the name of the current President of the [name].   Attempted to call patient's caregiver, Corrie Dandy, [phone]), there was no answer. Spoke to the guardian on call, Jacqueline [name], for [facility] [employer] [phone]) who is not patient's guardian herself, states she will try to reach patient's caregiver for more information. Is able to reach caregiver while on phone with this Clinical research associate. Caregiver states patient has been "declining" recently since decreasing one of her psychiatric medications, possibly Klonopin. States there were possibly other adjustments to patient's meds and patient has continued to decline, has been more irritable. States yesterday patient was upset that she had not stopped at the store for her. States that patient told caregiver she wanted to kill herself because she was upset that caregiver did not stop at the store. And states that over the weekend states that patient said she wanted to kill caregiver at another time when she was upset. Denies that patient destroys property or has been physically aggressive.  Guardian on call states that patient's guardian or another member of the staff who knows patient will call ED to discuss patient's history and disposition options in the AM.    PAST PSYCHIATRIC HISTORY  Prior psych meds: olanzapine  Outpatient: Has psychiatrist  Inpatient: [facility], [facility]  Suicide attempts:  Multiple Violence: Yes Otherwise as per HPI above.  PAST MEDICAL HISTORY  Past Medical History:  Diagnosis Date   Alcohol abuse    Alcoholism (HCC)    Anemia    Anxiety    Back pain    Benzodiazepine abuse (HCC)    Depression    Hypoglycemia    Hypothyroidism    Narcotic abuse (HCC)    Peptic ulcer    Seizures (HCC)    Suicide attempt (HCC)    multiple times   Thrombocytopenia (HCC) 06/17/2011   Thyroid disease      HOME MEDICATIONS  Facility Ordered Medications  Medication   acetaminophen (TYLENOL) tablet 650 mg   QUEtiapine (SEROQUEL) tablet 200 mg   pantoprazole (PROTONIX) EC tablet 40 mg   divalproex (DEPAKOTE) DR tablet 500 mg   fluticasone (FLONASE) 50 MCG/ACT nasal spray 1 spray   folic acid (FOLVITE) tablet 1 mg   polyethylene glycol (MIRALAX / GLYCOLAX) packet 17 g   senna (SENOKOT) tablet 17.2 mg   sodium chloride tablet 1 g   albuterol (VENTOLIN HFA) 108 (90 Base) MCG/ACT inhaler 2 puff   meloxicam (MOBIC) tablet 7.5 mg   clonazePAM (KLONOPIN) tablet 1 mg   hydrOXYzine (ATARAX) tablet 25 mg   escitalopram (LEXAPRO) tablet 10 mg   gabapentin (NEURONTIN) capsule 300-600 mg   divalproex (DEPAKOTE) DR tablet 1,500 mg   levothyroxine (SYNTHROID) tablet 125 mcg   PTA Medications  Medication Sig   acetaminophen (TYLENOL) 650 MG CR tablet Take 650 mg by mouth daily as needed for pain.   levothyroxine (SYNTHROID) 125 MCG tablet Take 125 mcg by mouth daily before breakfast.   QUEtiapine (SEROQUEL) 200 MG tablet Take 1 tablet (200 mg total) by mouth at bedtime.   pantoprazole (PROTONIX) 40  MG tablet Take 40 mg by mouth 2 (two) times daily.   divalproex (DEPAKOTE) 500 MG DR tablet Take 500 mg by mouth 2 (two) times daily. 1 tab daily and 3 tabs at bedtime for mood   fluticasone (FLONASE) 50 MCG/ACT nasal spray Place 1 spray into both nostrils every morning.   folic acid (FOLVITE) 1 MG tablet Take 1 mg by mouth every morning. After breakfast   polyethylene glycol  (MIRALAX / GLYCOLAX) 17 g packet Take 17 g by mouth daily.   senna (SENOKOT) 8.6 MG TABS tablet Take 2 tablets by mouth at bedtime.   simethicone (MYLICON) 80 MG chewable tablet Chew 160 mg by mouth 2 (two) times daily. gel   sodium chloride 1 g tablet Take 1 g by mouth 3 (three) times daily.   VENTOLIN HFA 108 (90 Base) MCG/ACT inhaler Inhale 2 puffs into the lungs every 4 (four) hours as needed for wheezing.   DEEP SEA NASAL SPRAY 0.65 % nasal spray Place 2 sprays into the nose every 8 (eight) hours as needed for congestion.   OLANZapine zydis (ZYPREXA ZYDIS) 5 MG disintegrating tablet Take one tablet (5 mg) in AM, and  two tablets (10 mg) at bedtime (Patient taking differently: Take 5 mg by mouth at bedtime. Take one tablet (5 mg) in AM, and  two tablets (10 mg) at bedtime)   meloxicam (MOBIC) 7.5 MG tablet Take 1 tablet (7.5 mg total) by mouth 2 (two) times daily.   clonazePAM (KLONOPIN) 0.5 MG tablet Take 2 tablets (1 mg total) by mouth 3 (three) times daily as needed for anxiety. (Patient taking differently: Take 1 mg by mouth in the morning, at noon, and at bedtime.)   hydrOXYzine (ATARAX) 25 MG tablet Take 25 mg by mouth 3 (three) times daily.   escitalopram (LEXAPRO) 10 MG tablet Take 10 mg by mouth daily.   gabapentin (NEURONTIN) 300 MG capsule Take 300-600 mg by mouth 2 (two) times daily. 1 am 2 pm     ALLERGIES  No Known Allergies  SOCIAL & SUBSTANCE USE HISTORY  Social History   Socioeconomic History   Marital status: Single    Spouse name: Not on file   Number of children: Not on file   Years of education: Not on file   Highest education level: Not on file  Occupational History   Not on file  Tobacco Use   Smoking status: Former    Current packs/day: 0.50    Types: Cigarettes   Smokeless tobacco: Never  Substance and Sexual Activity   Alcohol use: No    Comment: former   Drug use: Yes    Types: Oxycodone, Cocaine, Benzodiazepines    Comment: former   Sexual  activity: Yes    Birth control/protection: Surgical  Other Topics Concern   Not on file  Social History Narrative   ** Merged History Encounter **       ** Merged History Encounter **       Social Determinants of Health   Financial Resource Strain: Not on file  Food Insecurity: Not on file  Transportation Needs: Not on file  Physical Activity: Not on file  Stress: Not on file  Social Connections: Not on file   Social History   Tobacco Use  Smoking Status Former   Current packs/day: 0.50   Types: Cigarettes  Smokeless Tobacco Never   Social History   Substance and Sexual Activity  Alcohol Use No   Comment: former   Social  History   Substance and Sexual Activity  Drug Use Yes   Types: Oxycodone, Cocaine, Benzodiazepines   Comment: former      FAMILY HISTORY  Family History  Problem Relation Age of Onset   Alcohol abuse Father    Alcoholism Father    Cancer Other      MENTAL STATUS EXAM (MSE)  Presentation  General Appearance:  Appropriate for Environment  Eye Contact: Fair  Speech: Difficult to understand   Speech Volume: Normal   Mood and Affect  Mood: Dysphoric  Affect: Labile    Thought Process  Thought Processes: Circumstantial   Descriptions of Associations: Intact  Orientation: Full (Time, Place and Person)  Thought Content: Computation  History of Schizophrenia/Schizoaffective disorder: None  Hallucinations: Hallucinations: None  Ideas of Reference: None  Suicidal Thoughts: Suicidal Thoughts: No  Homicidal Thoughts: Homicidal Thoughts: No   Sensorium  Memory: Immediate: Fair; Recent: Fair; Remote: Fair  Judgment: Poor  Insight: Poor   Art therapist  Concentration: Fair  Attention Span: Fair  Recall: Fair  Fund of Knowledge: Fair  Language: Fair   Psychomotor Activity  Psychomotor Activity: Psychomotor agitated   Assets  Assets: Communication     VITALS  Blood pressure  135/83, pulse 60, temperature 98 F (36.7 C), temperature source Oral, resp. rate 16, weight 102 kg, last menstrual period 10/09/2016, SpO2 96%.  LABS  Admission on 07/11/2023  Component Date Value Ref Range Status   Sodium 07/11/2023 137  135 - 145 mmol/L Final   Potassium 07/11/2023 4.2  3.5 - 5.1 mmol/L Final   Chloride 07/11/2023 105  98 - 111 mmol/L Final   CO2 07/11/2023 25  22 - 32 mmol/L Final   Glucose, Bld 07/11/2023 78  70 - 99 mg/dL Final   Glucose reference range applies only to samples taken after fasting for at least 8 hours.   BUN 07/11/2023 17  6 - 20 mg/dL Final   Creatinine, Ser 07/11/2023 0.93  0.44 - 1.00 mg/dL Final   Calcium 16/10/930 8.9  8.9 - 10.3 mg/dL Final   Total Protein 35/57/3220 6.9  6.5 - 8.1 g/dL Final   Albumin 25/42/7062 3.9  3.5 - 5.0 g/dL Final   AST 37/62/8315 15  15 - 41 U/L Final   ALT 07/11/2023 16  0 - 44 U/L Final   Alkaline Phosphatase 07/11/2023 70  38 - 126 U/L Final   Total Bilirubin 07/11/2023 0.3  0.3 - 1.2 mg/dL Final   GFR, Estimated 07/11/2023 >60  >60 mL/min Final   Comment: (NOTE) Calculated using the CKD-EPI Creatinine Equation (2021)    Anion gap 07/11/2023 7  5 - 15 Final   Performed at [facility], [address] [place], [place] [address]   Alcohol, Ethyl (B) 07/11/2023 <10  <10 mg/dL Final   Comment: (NOTE) Lowest detectable limit for serum alcohol is 10 mg/dL.  For medical purposes only. Performed at [facility], [address], [place], [place] [address]    Opiates 07/11/2023 NONE DETECTED  NONE DETECTED Final   Cocaine 07/11/2023 NONE DETECTED  NONE DETECTED Final   Benzodiazepines 07/11/2023 NONE DETECTED  NONE DETECTED Final   Amphetamines 07/11/2023 NONE DETECTED  NONE DETECTED Final   Tetrahydrocannabinol 07/11/2023 NONE DETECTED  NONE DETECTED Final   Barbiturates 07/11/2023 NONE DETECTED  NONE DETECTED Final   Comment: (NOTE) DRUG SCREEN FOR MEDICAL PURPOSES ONLY.  IF CONFIRMATION IS NEEDED FOR ANY  PURPOSE, NOTIFY LAB WITHIN 5 DAYS.  LOWEST DETECTABLE LIMITS FOR URINE  DRUG SCREEN Drug Class                     Cutoff (ng/mL) Amphetamine and metabolites    1000 Barbiturate and metabolites    200 Benzodiazepine                 200 Opiates and metabolites        300 Cocaine and metabolites        300 THC                            50 Performed at [facility], [address], [place], [place] [address]    WBC 07/11/2023 5.4  4.0 - 10.5 K/uL Final   RBC 07/11/2023 3.44 (L)  3.87 - 5.11 MIL/uL Final   Hemoglobin 07/11/2023 11.5 (L)  12.0 - 15.0 g/dL Final   HCT 21/30/8657 34.0 (L)  36.0 - 46.0 % Final   MCV 07/11/2023 98.8  80.0 - 100.0 fL Final   MCH 07/11/2023 33.4  26.0 - 34.0 pg Final   MCHC 07/11/2023 33.8  30.0 - 36.0 g/dL Final   RDW 84/69/6295 13.4  11.5 - 15.5 % Final   Platelets 07/11/2023 273  150 - 400 K/uL Final   nRBC 07/11/2023 0.0  0.0 - 0.2 % Final   Neutrophils Relative % 07/11/2023 50  % Final   Neutro Abs 07/11/2023 2.7  1.7 - 7.7 K/uL Final   Lymphocytes Relative 07/11/2023 28  % Final   Lymphs Abs 07/11/2023 1.5  0.7 - 4.0 K/uL Final   Monocytes Relative 07/11/2023 15  % Final   Monocytes Absolute 07/11/2023 0.8  0.1 - 1.0 K/uL Final   Eosinophils Relative 07/11/2023 6  % Final   Eosinophils Absolute 07/11/2023 0.3  0.0 - 0.5 K/uL Final   Basophils Relative 07/11/2023 1  % Final   Basophils Absolute 07/11/2023 0.0  0.0 - 0.1 K/uL Final   Immature Granulocytes 07/11/2023 0  % Final   Abs Immature Granulocytes 07/11/2023 0.01  0.00 - 0.07 K/uL Final   Performed at [facility], [address] [place], [place] [address]   Acetaminophen (Tylenol), Serum 07/11/2023 <10 (L)  10 - 30 ug/mL Final   Comment: (NOTE) Therapeutic concentrations vary significantly. A range of 10-30 ug/mL  may be an effective concentration for many patients. However, some  are best treated at concentrations outside of this range. Acetaminophen concentrations >150 ug/mL at 4 hours  after ingestion  and >50 ug/mL at 12 hours after ingestion are often associated with  toxic reactions.  Performed at [facility], [address] [place], [place] [address]    Salicylate Lvl 07/11/2023 <7.0 (L)  7.0 - 30.0 mg/dL Final   Performed at [facility], [address], [place], [place] [address]   Color, Urine 07/11/2023 STRAW (A)  YELLOW Final   APPearance 07/11/2023 CLEAR  CLEAR Final   Specific Gravity, Urine 07/11/2023 1.004 (L)  1.005 - 1.030 Final   pH 07/11/2023 6.0  5.0 - 8.0 Final   Glucose, UA 07/11/2023 NEGATIVE  NEGATIVE mg/dL Final   Hgb urine dipstick 07/11/2023 NEGATIVE  NEGATIVE Final   Bilirubin Urine 07/11/2023 NEGATIVE  NEGATIVE Final   Ketones, ur 07/11/2023 NEGATIVE  NEGATIVE mg/dL Final   Protein, ur 36/64/4034 NEGATIVE  NEGATIVE mg/dL Final   Nitrite 74/25/9563 NEGATIVE  NEGATIVE Final   Leukocytes,Ua 07/11/2023 NEGATIVE  NEGATIVE Final   Performed at [facility], [address], [place], [place] [address]    PSYCHIATRIC REVIEW OF SYSTEMS (ROS)  ROS: Notable for the following relevant positive findings: Review of Systems  Psychiatric/Behavioral:  Positive for depression.     Additional findings:      Musculoskeletal: No abnormal movements observed      Gait & Station: Laying/Sitting      Pain Screening: Denies      Nutrition & Dental Concerns: Denies    RISK FORMULATION/ASSESSMENT  Is the patient experiencing any suicidal or homicidal ideations: No       Explain if yes: Patient denies, per caregiver has been making suicidal and homicidal statements Protective factors considered for safety management: Social support, identifies reasons to live, future oriented   Risk factors/concerns considered for safety management:  Prior attempt Depression Substance abuse/dependence Physical illness/chronic pain Impulsivity Aggression  Is there a safety management plan with the patient and treatment team to minimize risk factors and promote protective  factors: Yes           Explain: -Recommend social work discuss disposition with guardian and caregiver Is crisis care placement or psychiatric hospitalization recommended: No     Based on my current evaluation and risk assessment, patient is determined at this time to be at:  Moderate Risk  *RISK ASSESSMENT Risk assessment is a dynamic process; it is possible that this patient's condition, and risk level, may change. This should be re-evaluated and managed over time as appropriate. Please re-consult psychiatric consult services if additional assistance is needed in terms of risk assessment and management. If your team decides to discharge this patient, please advise the patient how to best access emergency psychiatric services, or to call 911, if their condition worsens or they feel unsafe in any way.  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 UDS negative. Psychiatry consulted for disposition recommendations. Patient difficult to understand, child like, circumstantial, psychomotor agitated, concrete, not internally preoccupied, not responding to internal stimuli, alert and oriented x 4, a limited historian. Patient reports she got into an argument with her caregiver and the caregiver  said to her, "You're driving me crazy I want you out of here!" She states she asked the caregiver for a Coca Cola yesterday and the caregiver got upset. Patient states she said to her caregiver, "I''m going to hurt myself if you don't give me a cola." She states, "I didn't mean it. I was just upset." She denies that she had suicidal intent. Denies current suicidal ideation, intent, and plan. Patient denies  that she threatened to kill caregiver. Patient reports she has been more irritable lately, does not know why. Patient endorses depressed mood because she cannot see her family. Spoke to the guardian on call, Jacqueline [name], for [employer] [employer] [phone]) who is not patient's guardian herself, and patient's caregiver. Caregiver states patient has been "declining" recently since decreasing one of her psychiatric medications, possibly Klonopin. States there were possibly other adjustments to patient's meds and patient has continued to decline, has been more irritable. States yesterday patient was upset that she had not stopped at the store for her. States that patient told caregiver she wanted to kill herself because she was upset that caregiver did not stop at the store. And  states that over the weekend states that patient said she wanted to kill caregiver at another time when she was upset. Denies that patient destroys property or has been physically aggressive. Patient presentation is consistent with major neurocognitive disorder secondary to TBI with behavioral disturbance. Patient denies suicidal and homicidal ideation. Patient at chronically elevated risk for harm to self and others due to prior attempts, impulsivity, aggression, physical illness. However, the reported behavior of threatening to kill herself and her caregiver appear to be acting out behaviors when she did not get what she wanted and not indicative of an acutely elevated risk of harm to self and others. Therefore, inpatient psychiatric hospitalization is not recommended. Guardian on call states that patient's guardian or another member of the staff who knows patient will call ED to discuss patient's history and disposition options in the AM with caregiver and social worker.    Adria Dill, MD Telepsychiatry Consult Services

## 2023-07-12 NOTE — ED Notes (Signed)
TTS completed. 

## 2023-07-12 NOTE — ED Notes (Signed)
Pt and another pt in hallway being verbally aggressive to one another. Verbal redirection not working, pts moved away from each other to de escalate

## 2023-07-12 NOTE — ED Notes (Signed)
ED Provider at bedside. 

## 2023-07-13 MED ORDER — LORAZEPAM 0.5 MG PO TABS
0.5000 mg | ORAL_TABLET | Freq: Once | ORAL | Status: AC
  Administered 2023-07-13: 0.5 mg via ORAL
  Filled 2023-07-13: qty 1

## 2023-07-13 NOTE — ED Notes (Signed)
Patient complaining her feet hurts, she has been walking around all morning, since 7 am has not sat down but one time for less then 5 minutes, I told patient if she sat down and rested her foot would probably feel better. Pt states, "I cannot sit down, I have too anxious and worried about where they are sending me"

## 2023-07-13 NOTE — ED Notes (Signed)
Pt had 1...Marland KitchenMarland KitchenMarland Kitchen 5 minute phone call with mom

## 2023-07-13 NOTE — ED Provider Notes (Addendum)
Emergency Medicine Observation Re-evaluation Note  Jacqueline [name] is a 57 y.o. female, seen on rounds today.  Pt initially presented to the ED for complaints of Psychiatric Evaluation Currently, the patient is awaiting group home placement..  Patient patient did not meet inpatient necessities for psychiatric treatment  Physical Exam  BP 111/66 (BP Location: Right Arm)   Pulse (!) 58   Temp 97.9 F (36.6 C)   Resp 17   Wt 102 kg   LMP 10/09/2016 Comment: neg. pregnancy test per RN in chart  SpO2 96%   BMI 33.21 kg/m  Physical Exam Alert and in no acute distress  ED Course / MDM  EKG:   I have reviewed the labs performed to date as well as medications administered while in observation.  Recent changes in the last 24 hours include none.  Plan  Current plan is for group home placement   Bethann Berkshire, MD 07/13/23 1610    Bethann Berkshire, MD 07/13/23 [phone]

## 2023-07-13 NOTE — ED Notes (Signed)
Pt continuing to pace floor, standing at the door asking multiple questions over and over, anxious and has not sat down more than 5 minutes since 7am.

## 2023-07-13 NOTE — ED Notes (Signed)
Pt received lunch tray  A coca-cola and tea.

## 2023-07-13 NOTE — ED Notes (Signed)
Pt received breakfast tray,  Given a coke and 2 cups coffee

## 2023-07-13 NOTE — ED Notes (Signed)
Pt talking loudly and continuously, has asked for coca cola and I gave her half a cup. Pt asking for coffee and another coca-cola at least 10 times since. Asked to use the phone 5 times and I told patient she could not use the phone until after 9 am. Pt. Also asked when visiting hours were and I told patient the times. Pt stated her mom was not doing well, and that pt states she has short term memory.

## 2023-07-14 NOTE — ED Provider Notes (Signed)
Emergency Medicine Observation Re-evaluation Note  Jacqueline [name] is a 57 y.o. female, seen on rounds today.  Pt initially presented to the ED for complaints of Psychiatric Evaluation Currently, the patient has been cleared for discharge, but pending group home placement.   Physical Exam  BP 109/68   Pulse 72   Temp 98 F (36.7 C) (Oral)   Resp 18   Wt 102 kg   LMP 10/09/2016 Comment: neg. pregnancy test per RN in chart  SpO2 97%   BMI 33.21 kg/m  Physical Exam General: NAD Cardiac: Normal HR Lungs: non-labored  Psych: Calm and cooperative.   ED Course / MDM  EKG:   I have reviewed the labs performed to date as well as medications administered while in observation.  Recent changes in the last 24 hours include none.   Plan  Current plan is for group home placement.     Coral Spikes, DO 07/14/23 1452

## 2023-07-14 NOTE — ED Notes (Signed)
Patient has been pacing back and forth in her room since 8am. This sitter saw patient trying to fall asleep standing up. This sitter told patient if she is tired she needs to lay down. Pt stated "I cannot sleep during the day" this sitter gave patient a chair. Pt is currently rocking back and forth in chair.

## 2023-07-15 NOTE — ED Notes (Signed)
Pt is not following directions, does not want to stay in her room. Continuously asking for soda and crackers. Pt does not understand why she needs to stay in her room. This nt has attempted to redirect her.

## 2023-07-15 NOTE — ED Provider Notes (Addendum)
Emergency Medicine Observation Re-evaluation Note  Jacqueline [name] is a 57 y.o. female, seen on rounds today. 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 UDS was negative.  Currently, the patient has been cleared for discharge, but pending group home placement. Per psych: Patient with impulsivity secondary to major neurocognitive disorder with behavioral disturbance, limited cognitive flexibility, poor distress tolerance. Patient acted out but did not attempt to harm herself or caregiver   Physical Exam  BP (!) 117/51   Pulse (!) 56   Temp 98.3 F (36.8 C) (Oral)   Resp 18   Wt 102 kg   LMP 10/09/2016 Comment: neg. pregnancy test per RN in chart  SpO2 98%   BMI 33.21 kg/m  Physical Exam General: NAD Cardiac: Normal HR Lungs: non-labored  Psych: Cooperative, calm.   ED Course / MDM  EKG:   I have reviewed the labs performed to date as well as medications administered while in observation.  Recent changes in the last 24 hours include none. Psych has recommended olanzapine 5 mg PO TID PRN for agitation.  Plan  Current plan is for group home placement.     Loetta Rough, MD 07/15/23 [phone]

## 2023-07-15 NOTE — ED Notes (Signed)
Patient repeatedly asking for snacks and food, saltines and peanut butter given to patient as well as a diet shasta lemon lime.

## 2023-07-15 NOTE — ED Notes (Signed)
Pt is very anxious, pacing the floors, PRN given

## 2023-07-15 NOTE — Progress Notes (Signed)
Calls made to [facility], care giver and [facility], unable to reach anyone after multiple attempts.  [facility] leadership made aware and requested assistance.

## 2023-07-15 NOTE — ED Notes (Addendum)
Remains manic, restless, agitated, pacing, walking in and out of room, standing in doorway, talking loudly with everyone. Pleasant and cooperative. Unable to consistently or persistently follow directions.

## 2023-07-15 NOTE — ED Notes (Signed)
Pt is currently sitting in her room, quietly watching TV.

## 2023-07-15 NOTE — ED Notes (Signed)
Patient is increasingly agitated and unable to be still, talking loudly and restless.

## 2023-07-15 NOTE — ED Notes (Signed)
Patient was allowed to use the phone however was unable to get any answers to her phone calls. Patient continues to repeatedly ask for snacks.

## 2023-07-16 MED ORDER — ACETAMINOPHEN 325 MG PO TABS
650.0000 mg | ORAL_TABLET | Freq: Four times a day (QID) | ORAL | Status: DC | PRN
  Administered 2023-07-16 – 2023-08-28 (×101): 650 mg via ORAL
  Filled 2023-07-16 (×104): qty 2

## 2023-07-16 NOTE — Progress Notes (Signed)
CM spoke with CC Adam, who confirms a 6o day notice has been issued, CM questioned reason why patient is not returning to the home and then transitioning to new placement.  Adam reports will forward this to QP in charge of contracting for the group home for resolution and will update CM on outcome.

## 2023-07-16 NOTE — Progress Notes (Signed)
CM out reached to [facility] CC and left VM requesting call back.    Contacted [facility], unable to reach guardian, able to speak with on call guardian who reports that patient was issued a 60 day by the group home, reports the primary LG is working with [facility] to secure respite, however unable to provide any updates on status of search.  CM was provided with phone numbers for primary LG [phone] ext [phone] and supervisor [phone] ext [phone].  Unable to reach either individual.

## 2023-07-16 NOTE — ED Notes (Signed)
Patient is hyper fixated and is expressing anxious behavior. RN and sitter have attempted to reassure patient that all her needs will be met.

## 2023-07-16 NOTE — ED Provider Notes (Signed)
Emergency Medicine Observation Re-evaluation Note  Jacqueline [name] is a 57 y.o. female, seen on rounds today.  Pt initially presented to the ED for complaints of Psychiatric Evaluation Currently, the patient is calm and cooperative.  Physical Exam  BP 123/67 (BP Location: Right Arm)   Pulse 79   Temp 98.6 F (37 C) (Oral)   Resp 20   Wt 102 kg   LMP 10/09/2016 Comment: neg. pregnancy test per RN in chart  SpO2 99%   BMI 33.21 kg/m  Physical Exam General: Calm and cooperative Cardiac: Regular rate rhythm Lungs: Clear to auscultation bilaterally Psych: Awake alert no acute distress  ED Course / MDM  EKG:   57 year old female initially presenting after making suicidal and homicidal statements.  I have reviewed the labs performed to date as well as medications administered while in observation.  Recent changes in the last 24 hours include continued boarding here in the emergency department awaiting group home placement.  No acute issues during my shift  Plan  Current plan is for group home placement.  Final diagnosis Depression Aggressive behavior   Royanne Foots, DO 07/16/23 1650

## 2023-07-17 MED ORDER — ONDANSETRON HCL 4 MG PO TABS: 4.0000 mg | ORAL_TABLET | Freq: Three times a day (TID) | ORAL | Status: DC | PRN

## 2023-07-17 MED ORDER — ONDANSETRON HCL 4 MG PO TABS
4.0000 mg | ORAL_TABLET | Freq: Once | ORAL | Status: DC
Start: 2023-07-17 — End: 2023-07-17

## 2023-07-17 NOTE — ED Notes (Signed)
Pt asked for laxatives and said they didn't work yesterday. She said her last bowel movement was Sunday (10/6) evening.

## 2023-07-17 NOTE — ED Provider Notes (Signed)
Emergency Medicine Observation Re-evaluation Note  Jacqueline [name] is a 57 y.o. female, seen on rounds today.  Pt initially presented to the ED for complaints of Psychiatric Evaluation Currently, the patient is calm and cooperative  Physical Exam  BP 105/76 (BP Location: Right Arm)   Pulse 96   Temp 99.1 F (37.3 C) (Oral)   Resp 20   Wt 102 kg   LMP 10/09/2016 Comment: neg. pregnancy test per RN in chart  SpO2 96%   BMI 33.21 kg/m  Physical Exam General: Well appearing  Cardiac: Normal rate  Lungs: Normal work of breathing  Psych: Calm   ED Course / MDM  EKG:   I have reviewed the labs performed to date as well as medications administered while in observation.  Recent changes in the last 24 hours include none; .  Plan  Current plan is for group home placement. Coral Spikes, DO 07/17/23 1008

## 2023-07-17 NOTE — ED Notes (Signed)
Pt c/o headache. She is repeatedly requesting this RN after I have explained that she can  have tylenol in one  hour. Ice pack provided to forehead. Kellogg RN

## 2023-07-17 NOTE — ED Notes (Signed)
Pt used phone to call family for 15 min

## 2023-07-18 MED ORDER — PROCHLORPERAZINE EDISYLATE 10 MG/2ML IJ SOLN
10.0000 mg | Freq: Once | INTRAMUSCULAR | Status: AC
  Administered 2023-07-18: 10 mg via INTRAMUSCULAR
  Filled 2023-07-18: qty 2

## 2023-07-18 MED ORDER — NAPROXEN 250 MG PO TABS
500.0000 mg | ORAL_TABLET | Freq: Once | ORAL | Status: AC
  Administered 2023-07-18: 500 mg via ORAL
  Filled 2023-07-18: qty 2

## 2023-07-18 MED ORDER — DIPHENHYDRAMINE HCL 50 MG/ML IJ SOLN
25.0000 mg | Freq: Once | INTRAMUSCULAR | Status: AC
  Administered 2023-07-18: 25 mg via INTRAMUSCULAR
  Filled 2023-07-18: qty 1

## 2023-07-18 NOTE — ED Notes (Signed)
Patient continues to complain of head hurting, tylenol given at 1813. MD made aware.

## 2023-07-18 NOTE — ED Notes (Signed)
Pt ambulated to restroom with steady gait. Clements Toro  Jerah Esty RN 

## 2023-07-18 NOTE — ED Notes (Signed)
Pt talking loudly from her door. She was requested to keep her voice down. Tylenol administered per her request. Water and graham crackers provided. Kellogg RN

## 2023-07-18 NOTE — ED Notes (Signed)
Lunch tray given to patient

## 2023-07-18 NOTE — ED Notes (Signed)
Pt is agitated, and talking extremely loud, complaining of a headache, repeatedly asking for coffee and becoming increasingly agitated and loud. Being very uncooperative.

## 2023-07-18 NOTE — ED Notes (Signed)
MD made aware that patient continues to complain of head hurting.

## 2023-07-18 NOTE — ED Provider Notes (Signed)
Emergency Medicine Observation Re-evaluation Note  Jacqueline [name] is a 57 y.o. female, seen on rounds today.  Pt initially presented to the ED for complaints of Psychiatric Evaluation Currently, the patient is resting comfortably.  Physical Exam  BP (!) 147/84 (BP Location: Left Arm)   Pulse 64   Temp (!) 97.5 F (36.4 C) (Axillary)   Resp 18   Wt 102 kg   LMP 10/09/2016 Comment: neg. pregnancy test per RN in chart  SpO2 98%   BMI 33.21 kg/m  Physical Exam Vitals and nursing note reviewed.  Constitutional:      General: She is not in acute distress.    Appearance: She is well-developed.  HENT:     Head: Normocephalic and atraumatic.  Eyes:     Conjunctiva/sclera: Conjunctivae normal.  Pulmonary:     Effort: Pulmonary effort is normal. No respiratory distress.  Musculoskeletal:        General: No swelling.     Cervical back: Neck supple.  Skin:    General: Skin is warm and dry.     Capillary Refill: Capillary refill takes less than 2 seconds.  Neurological:     Mental Status: She is alert.  Psychiatric:        Mood and Affect: Mood normal.      ED Course / MDM  EKG:   I have reviewed the labs performed to date as well as medications administered while in observation.  Recent changes in the last 24 hours include none.  Plan  Current plan is for placement.    Glendora Score, MD 07/18/23 [phone]

## 2023-07-18 NOTE — ED Notes (Signed)
Patient came up to nurses desk wanting coffee for a headache. Informed patient that coffee would make the headache worse and that the best would be water. Patient verbalized she wanted juice. Patient given crackers, peanut butter and apple juice. Patient is now sitting in her room.

## 2023-07-18 NOTE — ED Notes (Signed)
Patient given phone and had phone call to family. Patient continues to request coffee after just having morning coffee. Informed patient she will be able to have another cup after an hour of taking medications. Patient verbalized understanding.

## 2023-07-18 NOTE — ED Notes (Signed)
Coffee and graham crackers given to patient.

## 2023-07-18 NOTE — Progress Notes (Addendum)
CM spoke with LG Wallie Char, discussed that patient is medically ready to leave ED and return to group home. Explained that while a 60 day notice has been issued, the patient still needs to return to home and then transition to another placement within that 60 day period.  Lauris Poag expressed she will contact the provider and [facility] regarding this and then updated CM.  CM outreached to [facility] and left VM as well for his Production designer, theatre/television/film. Awaiting call back.

## 2023-07-18 NOTE — ED Notes (Signed)
Pt ambulated to restroom to place dentures. Kellogg RN

## 2023-07-18 NOTE — ED Notes (Signed)
Pt given breakfast tray

## 2023-07-19 MED ORDER — BISACODYL 5 MG PO TBEC: 5.0000 mg | DELAYED_RELEASE_TABLET | Freq: Two times a day (BID) | ORAL | Status: DC

## 2023-07-19 MED ORDER — IBUPROFEN 800 MG PO TABS
800.0000 mg | ORAL_TABLET | Freq: Once | ORAL | Status: AC
  Administered 2023-07-19: 800 mg via ORAL
  Filled 2023-07-19: qty 1

## 2023-07-19 MED ORDER — BISACODYL 5 MG PO TBEC
5.0000 mg | DELAYED_RELEASE_TABLET | Freq: Every day | ORAL | Status: DC | PRN
  Administered 2023-07-19 – 2023-08-12 (×10): 5 mg via ORAL
  Filled 2023-07-19 (×10): qty 1
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
## 2023-07-19 NOTE — ED Provider Notes (Signed)
Emergency Medicine Observation Re-evaluation Note  Jacqueline [name] is a 57 y.o. female, seen on rounds today.  Pt initially presented to the ED for complaints of Psychiatric Evaluation Currently, the patient is sitting on bed.  Physical Exam  BP 118/88   Pulse 82   Temp 98.5 F (36.9 C)   Resp 18   Wt 102 kg   LMP 10/09/2016 Comment: neg. pregnancy test per RN in chart  SpO2 97%   BMI 33.21 kg/m  Physical Exam General: nad, walking without difficulty Lungs: equal chest rise Psych: calm, interactive  ED Course / MDM  EKG:   I have reviewed the labs performed to date as well as medications administered while in observation.  Recent changes in the last 24 hours include no changes.  Plan  Current plan is for pending return to group home/placement    Sloan Leiter, DO 07/19/23 0820

## 2023-07-19 NOTE — Progress Notes (Signed)
Per [facility], patient was served an immediate discharge notice by prior group home and cannot return.  At this time 6 referrals have been sent out in an effort to locate another placement.  TOC is continuing to follow.

## 2023-07-19 NOTE — Progress Notes (Signed)
An updated Fl2 was provided the LG and CC for placement search.

## 2023-07-19 NOTE — ED Notes (Signed)
Pt complaining of her gums and dentures burning. When asked about which denture adhesive she's using, she presented a tube of petroleum jelly. She was asked to confirm that she was using that as an adhesive for her dentures and she did. She proceeded to show a sore in her mouth along her bottom gums. Pt was instructed to stop using the petroleum jelly and use the efferdent that she already had in he possession.

## 2023-07-19 NOTE — ED Notes (Signed)
Pt becoming increasingly agitated. She repeatedly requests food items such as drinks and crackers. She's been informed that a limit had been set due to her history of consuming the entire ED nourishment supply within one day. She seems to understand the limit at first, but then asks for more food items again with the next 15-20 minutes. Consistent with the patients history of TBI and associated memory problems.

## 2023-07-19 NOTE — ED Notes (Signed)
Pt complaining of burning

## 2023-07-19 NOTE — ED Notes (Signed)
Pt reports to [facility], sitter that she needs tylenol for pain to lip, pt also very restless, and getting loud with sitter, appears agitated.

## 2023-07-20 MED ORDER — LORAZEPAM 1 MG PO TABS
1.0000 mg | ORAL_TABLET | Freq: Once | ORAL | Status: AC
  Administered 2023-07-20: 1 mg via ORAL
  Filled 2023-07-20: qty 1

## 2023-07-20 MED ORDER — LIDOCAINE VISCOUS HCL 2 % MT SOLN
15.0000 mL | Freq: Once | OROMUCOSAL | Status: AC
  Administered 2023-07-20: 15 mL via OROMUCOSAL
  Filled 2023-07-20: qty 15

## 2023-07-20 MED ORDER — IBUPROFEN 400 MG PO TABS
600.0000 mg | ORAL_TABLET | Freq: Once | ORAL | Status: AC
  Administered 2023-07-20: 600 mg via ORAL
  Filled 2023-07-20: qty 2

## 2023-07-20 NOTE — ED Notes (Signed)
Pt given lunch tray.

## 2023-07-20 NOTE — ED Notes (Signed)
Pt offered water several times throughout the day, states "I have water in my room"

## 2023-07-20 NOTE — ED Notes (Signed)
Pt ambulated to restroom calmly and returned to room.

## 2023-07-20 NOTE — ED Notes (Signed)
Pt walking in hallway stating she isn't sleepy and asking for remote.

## 2023-07-20 NOTE — ED Notes (Signed)
Pt has already had her 3 phone calls throughout the day, pt was given the option to space them out as she was educated on the amount of phone calls she is allowed to have per day, pt is now continuously asking to make another phone call, re-enforced that she has already had her 3 phone calls for the day, pt is still continuously yelling out about wanting to make a couple more calls

## 2023-07-20 NOTE — ED Provider Notes (Signed)
Emergency Medicine Observation Re-evaluation Note  Jacqueline [name] is a 57 y.o. female, seen on rounds today.  Pt initially presented to the ED for complaints of Psychiatric Evaluation Currently, the patient is resting comfortably.  Physical Exam  BP 110/75 (BP Location: Right Arm)   Pulse 64   Temp 97.7 F (36.5 C) (Oral)   Resp 20   Wt 102 kg   LMP 10/09/2016 Comment: neg. pregnancy test per RN in chart  SpO2 96%   BMI 33.21 kg/m  Physical Exam Vitals and nursing note reviewed.  Constitutional:      General: She is not in acute distress.    Appearance: She is well-developed.  HENT:     Head: Normocephalic and atraumatic.  Eyes:     Conjunctiva/sclera: Conjunctivae normal.  Pulmonary:     Effort: Pulmonary effort is normal. No respiratory distress.  Musculoskeletal:        General: No swelling.     Cervical back: Neck supple.  Skin:    General: Skin is warm and dry.     Capillary Refill: Capillary refill takes less than 2 seconds.  Neurological:     Mental Status: She is alert.  Psychiatric:        Mood and Affect: Mood normal.      ED Course / MDM  EKG:   I have reviewed the labs performed to date as well as medications administered while in observation.  Recent changes in the last 24 hours include social work evaluation and plan for [facility] placement.  Plan  Current plan is for placement.    Glendora Score, MD 07/20/23 [phone]

## 2023-07-20 NOTE — ED Notes (Signed)
Pt appears very restless, unable to redirect, walking out in the hall and screaming, prn klonopin given

## 2023-07-20 NOTE — ED Notes (Signed)
Pt continuously asking for snacks and juices. Pt was given water.

## 2023-07-20 NOTE — ED Notes (Signed)
Pt continuously asking for coffee, pt has already had 2 cups of coffee and a soda, pt informed of limits set for coffee as pt appears to be experiencing mania, pt does not follow commands and is not easily redirectable

## 2023-07-20 NOTE — ED Notes (Signed)
Pt agitated asking for snacks and laxative. Was redirected back to room by CN and RN. Told that medicine will be given at 9:00pm. Sitter still siting with pt.

## 2023-07-20 NOTE — ED Notes (Signed)
Pt sitting in chair watching tv asking for laxatives.

## 2023-07-20 NOTE — ED Notes (Signed)
Pt exhibiting manic behavior, such as rapid speech, moving from one topic from a completely different other topic in the same conversation, inability to rest or sit in the same position for longer than 5 minutes, rambling--MD made aware, TTS re-evaluation placed for pt

## 2023-07-20 NOTE — ED Notes (Signed)
Pt c/o mouth being raw and sore from dentures and requesting something for the sores/rawness due to dentures--MD made aware

## 2023-07-20 NOTE — ED Notes (Signed)
Pt ambulating to bathroom.

## 2023-07-21 LAB — CBG MONITORING, ED: Glucose-Capillary: 125 mg/dL — ABNORMAL HIGH (ref 70–99)

## 2023-07-21 MED ORDER — LORAZEPAM 1 MG PO TABS
1.0000 mg | ORAL_TABLET | Freq: Once | ORAL | Status: AC
  Administered 2023-07-21: 1 mg via ORAL
  Filled 2023-07-21: qty 1

## 2023-07-21 MED ORDER — STERILE WATER FOR INJECTION IJ SOLN
INTRAMUSCULAR | Status: AC
  Administered 2023-07-21: 10 mL
  Filled 2023-07-21: qty 10

## 2023-07-21 MED ORDER — ZIPRASIDONE MESYLATE 20 MG IM SOLR
20.0000 mg | Freq: Once | INTRAMUSCULAR | Status: AC
  Administered 2023-07-21: 20 mg via INTRAMUSCULAR
  Filled 2023-07-21: qty 20

## 2023-07-21 MED ORDER — STERILE WATER FOR INJECTION IJ SOLN
INTRAMUSCULAR | Status: AC
  Filled 2023-07-21: qty 10

## 2023-07-21 MED ORDER — OLANZAPINE 10 MG IM SOLR
5.0000 mg | Freq: Once | INTRAMUSCULAR | Status: AC
  Administered 2023-07-21: 5 mg via INTRAMUSCULAR
  Filled 2023-07-21: qty 10

## 2023-07-21 NOTE — ED Notes (Signed)
Patient continues to come out of room, raise voice, failing to follow direction. Pt is continuing to argue with staff and attempt to move past security to approach primary nurse. Primary nurse has attempted to verbally deescalate patient multiple times throughout the day and has been unsuccessful. Deescalating medications have been attempted and have been unsuccessful to this point.

## 2023-07-21 NOTE — ED Notes (Addendum)
Out into hall yelling, not following requests, not following directions, pacing, agitated, argumentative, escalating, riling others in area, scaring others in area, recent measures to de-escalate unsuccessful, medications unsuccessful. Security present. EDP notified.

## 2023-07-21 NOTE — ED Notes (Signed)
Pt ambulated to restroom.

## 2023-07-21 NOTE — ED Notes (Signed)
Anxious, agitated, loud, escalating, pacing, difficulty following requests.

## 2023-07-21 NOTE — ED Notes (Signed)
Pt stating she feels "shaky" and she is "half-way diabetic"; asking for packets of sugar. Checked BGL, 125. Pt continues to ask for sugar, water, juice, crackers, peanut butter. Informed pt I would bring her grape juice with night time medication. Pt agreeable to sit and stop yelling - however, before sitting down, pt asking for items again, stating she "forgot".

## 2023-07-21 NOTE — ED Notes (Signed)
Patient said she was going to slap nurse tech because she's not getting what she wants. She already had her medication and wants more and threatened to slap nurse tech.

## 2023-07-21 NOTE — ED Notes (Addendum)
, °

## 2023-07-21 NOTE — ED Notes (Addendum)
Pt ambulating to restroom. Stated she needed to wipe denture cream off.

## 2023-07-21 NOTE — ED Notes (Signed)
Patient given snack and drink

## 2023-07-21 NOTE — ED Notes (Signed)
Agitation meds given; pt continues to yell out for snacks

## 2023-07-21 NOTE — ED Provider Notes (Signed)
Emergency Medicine Observation Re-evaluation Note  Jacqueline [name] is a 57 y.o. female, seen on rounds today.  Pt initially presented to the ED for complaints of Psychiatric Evaluation Currently, the patient is resting comfortably.  Physical Exam  BP 118/68 (BP Location: Left Arm)   Pulse 71   Temp 98.1 F (36.7 C) (Oral)   Resp 18   Wt 102 kg   LMP 10/09/2016 Comment: neg. pregnancy test per RN in chart  SpO2 97%   BMI 33.21 kg/m  Physical Exam Vitals and nursing note reviewed.  Constitutional:      General: She is not in acute distress.    Appearance: She is well-developed.  HENT:     Head: Normocephalic and atraumatic.  Eyes:     Conjunctiva/sclera: Conjunctivae normal.  Pulmonary:     Effort: Pulmonary effort is normal. No respiratory distress.  Musculoskeletal:        General: No swelling.     Cervical back: Neck supple.  Skin:    General: Skin is warm and dry.     Capillary Refill: Capillary refill takes less than 2 seconds.  Neurological:     Mental Status: She is alert.  Psychiatric:        Mood and Affect: Mood normal.      ED Course / MDM  EKG:   I have reviewed the labs performed to date as well as medications administered while in observation.  Recent changes in the last 24 hours include some mild agitation, pain with dentures, continue TTS evaluation.  Plan  Current plan is for placement.    Glendora Score, MD 07/21/23 [phone]

## 2023-07-21 NOTE — ED Provider Notes (Signed)
Pt with episode agitated behavior. Pt indicates was feeling somewhat anxious.  Denies specific pain or other c/o. Pt alert, vitals normal, no distress.   Will give zyprexa and ativan for symptom improvement.      Cathren Laine, MD 07/21/23 2140

## 2023-07-21 NOTE — ED Notes (Signed)
Pt ambulated to restroom after urinating in bed. Pt instructed to shower. New linen and scrubs were provided.

## 2023-07-21 NOTE — ED Notes (Signed)
Pt awake and attempting to place dentures in. Pt waked agitated repeatedly requesting tylenol. Pt was reminded she received tylenol at 0518 and it is ordered to be given every 6 hours as needed.

## 2023-07-21 NOTE — Progress Notes (Signed)
Psych provider TTS team will address TTS consult once pt is appropriate for medical clearance.   Care Team notified: Julaine Fusi, Tonny Branch, Delvondria Dubose,LCAS   Maryjean Ka, MSW, [facility] 07/21/2023 3:14 PM

## 2023-07-22 DIAGNOSIS — F308 Other manic episodes: Secondary | ICD-10-CM | POA: Diagnosis not present

## 2023-07-22 MED ORDER — LIDOCAINE VISCOUS HCL 2 % MT SOLN
15.0000 mL | Freq: Once | OROMUCOSAL | Status: AC
  Administered 2023-07-22: 15 mL via OROMUCOSAL
  Filled 2023-07-22: qty 15

## 2023-07-22 MED ORDER — LORAZEPAM 1 MG PO TABS
2.0000 mg | ORAL_TABLET | Freq: Once | ORAL | Status: AC
  Administered 2023-07-22: 2 mg via ORAL
  Filled 2023-07-22: qty 2

## 2023-07-22 MED ORDER — IBUPROFEN 800 MG PO TABS
800.0000 mg | ORAL_TABLET | Freq: Once | ORAL | Status: AC
  Administered 2023-07-22: 800 mg via ORAL
  Filled 2023-07-22: qty 1

## 2023-07-22 NOTE — ED Notes (Signed)
Pt noted to be standing in the hallway screaming and making demands.  Sitter and RNs have attempted to redirect Pt w/o success.

## 2023-07-22 NOTE — ED Notes (Signed)
Pt given 6 denture cleanser tablets and a new tube of Fresh Mint Denture Adhesive.

## 2023-07-22 NOTE — ED Notes (Signed)
Telepsych cart at bedside  ?

## 2023-07-22 NOTE — ED Notes (Signed)
Pt had 2nd phone calls

## 2023-07-22 NOTE — ED Notes (Signed)
Pt.not following directions and following me across the ER while I am trying to help another patient. She went to nurses station, yelling at secretary and nurses to get her coffee and peanut butter to put on her dentures.

## 2023-07-22 NOTE — ED Notes (Signed)
Pt continues to scream random things and random requests.  Pt difficult to redirect.  Pt continues to complain about her gums hurting.  This Clinical research associate had a long conversation regarding the importance of her giving her gums a break overnight and giving them sometime to heal.

## 2023-07-22 NOTE — ED Notes (Signed)
Agitated, yelling, persistent repetitive calling out and demanding requests. Behavior continues unchanged despite requests. Riling other pts. Yelling at sitters.

## 2023-07-22 NOTE — Consult Note (Signed)
Telepsych Consultation   Reason for Consult:  *** Referring Physician:  *** Location of Patient:  Location of Provider: { [facility] Provider Location:[id number]}  Patient Identification: Jacqueline [name] MRN:  [id number] Principal Diagnosis: <principal problem not specified> Diagnosis:  Active Problems:   * No active hospital problems. *   Total Time spent with patient: {Time; 15 min - 8 hours:17441}  Subjective:   Jacqueline [name] is a 57 y.o. female patient admitted with ***.  HPI:   Jacqueline [name], 57 y.o., female patient presented to ***.  Patient seen via telepsych by this provider; chart reviewed and consulted with Dr. Viviano Simas on 07/22/23.  On evaluation Jacqueline [name] is observed in her exam room, on a chair, watching tv.  When she sees this writer on the camera, she states, "my gums hurt and it's because of my dentures.   She states she is not suicidal or homicidal or and denies AVH.  She states she was never suicidal or homicidal, states her roommate only said that to get her to the hospital.  Patient reports she's been taking her medications as prescribed, and denies nausea, dizziness, or GI side effects. She states she    She states she like "ativan" when she's upset During evaluation Jacqueline [name] is ***(position); ***he/she is alert/oriented x 4; calm/cooperative; and mood congruent with affect.  Patient is speaking in a clear tone at moderate volume, and normal pace; with good eye contact.  ***His/Her thought process is coherent and relevant; There is no indication that ***he/she is currently responding to internal/external stimuli or experiencing delusional thought content.  Patient denies suicidal/self-harm/homicidal ideation, psychosis, and paranoia.  Patient has remained calm throughout assessment and has answered questions appropriately.       Per ED Provider Admissions Assessment 07/11/2023: Chief Complaint              Psychiatric Evaluation   History of  Present Illness              Jacqueline [name] is a 57 y.o. year-old female with a history of depression presenting to the ED with chief complaint of psychiatric evaluation.   Medication change about 3 months ago, mood change about 3 weeks ago.  Much more irritable, angry, unpleasant.  Becoming more and more potentially violent, threatening to kill caregiver yesterday, threatening to kill self today.   Past Psychiatric History: ***  Risk to Self:   Risk to Others:   Prior Inpatient Therapy:   Prior Outpatient Therapy:    Past Medical History:  Past Medical History:  Diagnosis Date  . Alcohol abuse   . Alcoholism (HCC)   . Anemia   . Anxiety   . Back pain   . Benzodiazepine abuse (HCC)   . Depression   . Hypoglycemia   . Hypothyroidism   . Narcotic abuse (HCC)   . Peptic ulcer   . Seizures (HCC)   . Suicide attempt (HCC)    multiple times  . Thrombocytopenia (HCC) 06/17/2011  . Thyroid disease     Past Surgical History:  Procedure Laterality Date  . ABDOMINAL SURGERY    . CHOLECYSTECTOMY    . COLONOSCOPY N/A 10/02/2016   Procedure: COLONOSCOPY;  Surgeon: Sherrilyn Rist, MD;  Location: [facility] ENDOSCOPY;  Service: Gastroenterology;  Laterality: N/A;  . ESOPHAGOGASTRODUODENOSCOPY    . ESOPHAGOGASTRODUODENOSCOPY N/A 03/23/2013   Procedure: ESOPHAGOGASTRODUODENOSCOPY (EGD);  Surgeon: Meryl Dare, MD;  Location: [facility] ENDOSCOPY;  Service: Endoscopy;  Laterality: N/A;  .  ESOPHAGOGASTRODUODENOSCOPY N/A 09/18/2016   Procedure: ESOPHAGOGASTRODUODENOSCOPY (EGD);  Surgeon: Violeta Gelinas, MD;  Location: [facility] ENDOSCOPY;  Service: General;  Laterality: N/A;  . ESOPHAGOGASTRODUODENOSCOPY N/A 10/01/2016   Procedure: ESOPHAGOGASTRODUODENOSCOPY (EGD);  Surgeon: Jeani Hawking, MD;  Location: [facility] ENDOSCOPY;  Service: Endoscopy;  Laterality: N/A;  . GASTRIC BYPASS    . PARTIAL COLECTOMY Right 10/03/2016   Procedure: PARTIAL COLECTOMY;  Surgeon: Jimmye Norman, MD;  Location[facility] OR;  Service: General;   Laterality: Right;  . PERCUTANEOUS TRACHEOSTOMY N/A 09/18/2016   Procedure: PERCUTANEOUS TRACHEOSTOMY;  Surgeon: Violeta Gelinas, MD;  Location: [facility] OR;  Service: General;  Laterality: N/A;  . TUBAL LIGATION     Family History:  Family History  Problem Relation Age of Onset  . Alcohol abuse Father   . Alcoholism Father   . Cancer Other    Family Psychiatric  History: *** Social History:  Social History   Substance and Sexual Activity  Alcohol Use No   Comment: former     Social History   Substance and Sexual Activity  Drug Use Yes  . Types: Oxycodone, Cocaine, Benzodiazepines   Comment: former    Social History   Socioeconomic History  . Marital status: Single    Spouse name: Not on file  . Number of children: Not on file  . Years of education: Not on file  . Highest education level: Not on file  Occupational History  . Not on file  Tobacco Use  . Smoking status: Former    Current packs/day: 0.50    Types: Cigarettes  . Smokeless tobacco: Never  Substance and Sexual Activity  . Alcohol use: No    Comment: former  . Drug use: Yes    Types: Oxycodone, Cocaine, Benzodiazepines    Comment: former  . Sexual activity: Yes    Birth control/protection: Surgical  Other Topics Concern  . Not on file  Social History Narrative   ** Merged History Encounter **       ** Merged History Encounter **       Social Determinants of Health   Financial Resource Strain: Not on file  Food Insecurity: Not on file  Transportation Needs: Not on file  Physical Activity: Not on file  Stress: Not on file  Social Connections: Not on file   Additional Social History:    Allergies:  No Known Allergies  Labs:  Results for orders placed or performed during the hospital encounter of 07/11/23 (from the past 48 hour(s))  CBG monitoring, ED     Status: Abnormal   Collection Time: 07/21/23  7:51 PM  Result Value Ref Range   Glucose-Capillary 125 (H) 70 - 99 mg/dL    Comment:  Glucose reference range applies only to samples taken after fasting for at least 8 hours.    Medications:  Current Facility-Administered Medications  Medication Dose Route Frequency Provider Last Rate Last Admin  . acetaminophen (TYLENOL) tablet 650 mg  650 mg Oral Q6H PRN Sabas Sous, MD   650 mg at 07/22/23 0646  . albuterol (VENTOLIN HFA) 108 (90 Base) MCG/ACT inhaler 2 puff  2 puff Inhalation Q4H PRN Sabas Sous, MD      . bisacodyl (DULCOLAX) EC tablet 5 mg  5 mg Oral Daily PRN Vanetta Mulders, MD   5 mg at 07/22/23 0646  . clonazePAM (KLONOPIN) tablet 1 mg  1 mg Oral TID PRN Sabas Sous, MD   1 mg at 07/22/23 1258  . divalproex (DEPAKOTE) DR tablet  1,500 mg  1,500 mg Oral QHS Sabas Sous, MD   1,500 mg at 07/21/23 2115  . divalproex (DEPAKOTE) DR tablet 500 mg  500 mg Oral Daily Sabas Sous, MD   500 mg at 07/22/23 1001  . escitalopram (LEXAPRO) tablet 10 mg  10 mg Oral Daily Sabas Sous, MD   10 mg at 07/22/23 1000  . fluticasone (FLONASE) 50 MCG/ACT nasal spray 1 spray  1 spray Each Nare q morning Sabas Sous, MD   1 spray at 07/22/23 1000  . folic acid (FOLVITE) tablet 1 mg  1 mg Oral q morning Sabas Sous, MD   1 mg at 07/22/23 1000  . gabapentin (NEURONTIN) capsule 300 mg  300 mg Oral Daily Sabas Sous, MD   300 mg at 07/22/23 1000  . gabapentin (NEURONTIN) capsule 600 mg  600 mg Oral QHS Sabas Sous, MD   600 mg at 07/21/23 2114  . hydrOXYzine (ATARAX) tablet 25 mg  25 mg Oral TID Sabas Sous, MD   25 mg at 07/22/23 1000  . levothyroxine (SYNTHROID) tablet 125 mcg  125 mcg Oral Q0600 Sabas Sous, MD   125 mcg at 07/22/23 0645  . lidocaine (XYLOCAINE) 2 % viscous mouth solution 15 mL  15 mL Mouth/Throat Once Tanda Rockers A, DO      . meloxicam (MOBIC) tablet 7.5 mg  7.5 mg Oral BID Sabas Sous, MD   7.5 mg at 07/22/23 1000  . OLANZapine zydis (ZYPREXA) disintegrating tablet 5 mg  5 mg Oral TID PRN Adria Dill, MD   5 mg at  07/22/23 0646  . ondansetron (ZOFRAN) tablet 4 mg  4 mg Oral Q8H PRN Eber Hong, MD      . pantoprazole (PROTONIX) EC tablet 40 mg  40 mg Oral BID Sabas Sous, MD   40 mg at 07/22/23 1000  . polyethylene glycol (MIRALAX / GLYCOLAX) packet 17 g  17 g Oral Daily Sabas Sous, MD   17 g at 07/20/23 0910  . QUEtiapine (SEROQUEL) tablet 200 mg  200 mg Oral QHS Sabas Sous, MD   200 mg at 07/21/23 2115  . senna (SENOKOT) tablet 17.2 mg  2 tablet Oral QHS Sabas Sous, MD   17.2 mg at 07/21/23 2116  . sodium chloride tablet 1 g  1 g Oral TID Sabas Sous, MD   1 g at 07/22/23 1000   Current Outpatient Medications  Medication Sig Dispense Refill  . acetaminophen (TYLENOL) 650 MG CR tablet Take 650 mg by mouth daily as needed for pain.    . clonazePAM (KLONOPIN) 0.5 MG tablet Take 2 tablets (1 mg total) by mouth 3 (three) times daily as needed for anxiety. (Patient taking differently: Take 1 mg by mouth in the morning, at noon, and at bedtime.) 15 tablet 0  . DEEP SEA NASAL SPRAY 0.65 % nasal spray Place 2 sprays into the nose every 8 (eight) hours as needed for congestion.    . divalproex (DEPAKOTE) 500 MG DR tablet Take 500 mg by mouth 2 (two) times daily. 1 tab daily and 3 tabs at bedtime for mood    . escitalopram (LEXAPRO) 10 MG tablet Take 10 mg by mouth daily.    . fluticasone (FLONASE) 50 MCG/ACT nasal spray Place 1 spray into both nostrils every morning.    . folic acid (FOLVITE) 1 MG tablet Take 1 mg by mouth every morning. After breakfast    .  gabapentin (NEURONTIN) 300 MG capsule Take 300-600 mg by mouth 2 (two) times daily. 1 am 2 pm    . hydrOXYzine (ATARAX) 25 MG tablet Take 25 mg by mouth 3 (three) times daily.    Marland Kitchen levothyroxine (SYNTHROID) 125 MCG tablet Take 125 mcg by mouth daily before breakfast.    . meloxicam (MOBIC) 7.5 MG tablet Take 1 tablet (7.5 mg total) by mouth 2 (two) times daily. 60 tablet 5  . OLANZapine zydis (ZYPREXA ZYDIS) 5 MG disintegrating tablet  Take one tablet (5 mg) in AM, and  two tablets (10 mg) at bedtime (Patient taking differently: Take 5 mg by mouth at bedtime. Take one tablet (5 mg) in AM, and  two tablets (10 mg) at bedtime) 90 tablet 0  . pantoprazole (PROTONIX) 40 MG tablet Take 40 mg by mouth 2 (two) times daily.    . polyethylene glycol (MIRALAX / GLYCOLAX) 17 g packet Take 17 g by mouth daily.    . QUEtiapine (SEROQUEL) 200 MG tablet Take 1 tablet (200 mg total) by mouth at bedtime. 30 tablet 0  . senna (SENOKOT) 8.6 MG TABS tablet Take 2 tablets by mouth at bedtime.    . simethicone (MYLICON) 80 MG chewable tablet Chew 160 mg by mouth 2 (two) times daily. gel    . sodium chloride 1 g tablet Take 1 g by mouth 3 (three) times daily.    . VENTOLIN HFA 108 (90 Base) MCG/ACT inhaler Inhale 2 puffs into the lungs every 4 (four) hours as needed for wheezing.      Musculoskeletal: Strength & Muscle Tone: {desc; muscle tone:32375} Gait & Station: {PE GAIT ED IONG:29528} Patient leans: {Patient Leans:21022755}          Psychiatric Specialty Exam:  Presentation  General Appearance:  Appropriate for Environment  Eye Contact: Fair  Speech: Normal Rate  Speech Volume: Normal  Handedness:No data recorded  Mood and Affect  Mood: Dysphoric  Affect: Appropriate   Thought Process  Thought Processes: Coherent  Descriptions of Associations:Intact  Orientation:Full (Time, Place and Person)  Thought Content:Computation  History of Schizophrenia/Schizoaffective disorder:No data recorded Duration of Psychotic Symptoms:No data recorded Hallucinations:No data recorded Ideas of Reference:None  Suicidal Thoughts:No data recorded Homicidal Thoughts:No data recorded  Sensorium  Memory: Immediate Good; Recent Good; Remote Good  Judgment: Poor  Insight: Poor   Executive Functions  Concentration: Fair  Attention Span: Fair  Recall: Fair  Fund of  Knowledge: Fair  Language: Good   Psychomotor Activity  Psychomotor Activity:No data recorded  Assets  Assets:No data recorded  Sleep  Sleep:No data recorded   Physical Exam: Physical Exam ROS Blood pressure 117/65, pulse 75, temperature 98 F (36.7 C), temperature source Oral, resp. rate 17, weight 102 kg, last menstrual period 10/09/2016, SpO2 98%. Body mass index is 33.21 kg/m.  Treatment Plan Summary: {CHL [facility] MD TX UXLK:440102725}  Disposition: {CHL [facility] Consult DGUY:40347}    Spoke with Dr. ***; informed of above recommendation and disposition  This service was provided via telemedicine using a 2-way, interactive audio and video technology.  Names of all persons participating in this telemedicine service and their role in this encounter. Name: *** Role: ***  Name: *** Role: ***  Name: *** Role: ***  Name: *** Role: ***    Chales Abrahams, NP 07/22/2023 1:09 PM

## 2023-07-22 NOTE — ED Notes (Signed)
Pt continues to get agitated even with frequent PRN medications and ED staff attempting to meet her needs.  When one need/request is met, Pt becomes fixated on another need/request.  Pt's boisterous behavior is agitating other patients.

## 2023-07-22 NOTE — ED Notes (Signed)
Pt. Is not following directions, talking non-stop, asking the same question over and over again. Pt. Sits down for 10 minutes and up standing at door, outside of room, and pacing hallway. Pt. Is acting anxious.

## 2023-07-22 NOTE — ED Notes (Signed)
Patient will not follow instructions. Has made several outbursts and refuses to stay in room.

## 2023-07-22 NOTE — ED Notes (Signed)
Pt noted to be resting comfortably in her recliner.

## 2023-07-22 NOTE — ED Notes (Signed)
Unable to settle or calm down. No benefit noted from 0646 zyprexxa. Agitated, restless, pacing, loud, in and out of room, invading others space/ privacy, riling others, inciting negative responses in other pts, unable to follow requests or directions, security called. Unable to rationalize. EDP notified.

## 2023-07-22 NOTE — ED Notes (Signed)
Pt had 1st phone call

## 2023-07-22 NOTE — ED Notes (Signed)
Pt screaming and yelling wanting coffee, she has already had coffee and can not have coffee right now per nurse because she got medicine for her mouth. Pt is not following directions will not sit down.

## 2023-07-22 NOTE — ED Provider Notes (Signed)
Emergency Medicine Observation Re-evaluation Note  Jacqueline [name] is a 57 y.o. female, seen on rounds today.  Pt initially presented to the ED for complaints of Psychiatric Evaluation Currently, the patient is calm.  Physical Exam  BP 117/65 (BP Location: Right Arm)   Pulse 75   Temp 98 F (36.7 C) (Oral)   Resp 17   Wt 102 kg   LMP 10/09/2016 Comment: neg. pregnancy test per RN in chart  SpO2 98%   BMI 33.21 kg/m  Physical Exam General: nad Lungs: no resp distress Psych: agitated intermittently  ED Course / MDM  EKG:EKG Interpretation Date/Time:  Sunday July 21 2023 21:32:21 EDT Ventricular Rate:  66 PR Interval:  162 QRS Duration:  88 QT Interval:  458 QTC Calculation: 480 R Axis:   49  Text Interpretation: Normal sinus rhythm Prolonged QT Abnormal ECG When compared with ECG of 25-Apr-2020 15:22, No significant change was found Confirmed by Tanda Rockers ([id number]) on 07/22/2023 7:21:44 AM  I have reviewed the labs performed to date as well as medications administered while in observation.  Recent changes in the last 24 hours include intermittent agitation, provoking other patients/staff, shouting intermittently. Disruptive  Plan  Current plan is for placement.    Sloan Leiter, DO 07/22/23 [phone]

## 2023-07-22 NOTE — ED Notes (Signed)
Pt took medications w/o incident.

## 2023-07-22 NOTE — ED Notes (Signed)
Pt continues to repetitively ask for various things.  Pt difficult to redirect.  Pt reports "ulcers" on her lower gums and this writer suggested she remove her dentures.  EDP made aware.

## 2023-07-22 NOTE — ED Notes (Signed)
Pt remains agitated since earlier administration of PRN medications.  Pt repetitively yelling at sitters and other ED staff about coffee.  Pt is currently fixated on coffee and possible constipation.  Security called to bedside to help redirect Pt back to her room.

## 2023-07-22 NOTE — ED Notes (Signed)
Pt making phone call Pt aware this is the last call of the day, even if no one answers the phone. Pt mother did not answer, pt did leave a voicemail.

## 2023-07-23 DIAGNOSIS — F308 Other manic episodes: Secondary | ICD-10-CM | POA: Insufficient documentation

## 2023-07-23 MED ORDER — LIDOCAINE VISCOUS HCL 2 % MT SOLN
15.0000 mL | Freq: Once | OROMUCOSAL | Status: AC
  Administered 2023-07-23: 15 mL via OROMUCOSAL
  Filled 2023-07-23: qty 15

## 2023-07-23 MED ORDER — DIVALPROEX SODIUM 250 MG PO DR TAB
750.0000 mg | DELAYED_RELEASE_TABLET | Freq: Every day | ORAL | Status: DC
  Administered 2023-07-24 – 2023-08-12 (×20): 750 mg via ORAL
  Filled 2023-07-23 (×20): qty 3

## 2023-07-23 MED ORDER — OLANZAPINE 5 MG PO TBDP: 7.5000 mg | ORAL_TABLET | Freq: Three times a day (TID) | ORAL | Status: DC | PRN

## 2023-07-23 MED ORDER — OLANZAPINE 5 MG PO TBDP
5.0000 mg | ORAL_TABLET | Freq: Three times a day (TID) | ORAL | Status: DC | PRN
  Administered 2023-07-23 – 2023-08-29 (×46): 5 mg via ORAL
  Filled 2023-07-23 (×48): qty 1

## 2023-07-23 MED ORDER — LIDOCAINE VISCOUS HCL 2 % MT SOLN
15.0000 mL | Freq: Three times a day (TID) | OROMUCOSAL | Status: DC | PRN
  Administered 2023-07-23 – 2023-08-11 (×40): 15 mL via OROMUCOSAL
  Filled 2023-07-23 (×43): qty 15

## 2023-07-23 NOTE — ED Notes (Signed)
TTS with psychiatry initiated/ in progress

## 2023-07-23 NOTE — ED Notes (Signed)
Assumed care of pt, found her alert and standing at the door.  Her only complaint is that she wants her snack and her night meds.  RN explained that they will be given when they are ordered.

## 2023-07-23 NOTE — ED Notes (Signed)
Pt continues to yell for peanut butter.  She just came to the desk and asked RN to call provider to get "something to help me sleep."  Message sent to provider however she is now quiet in bed.

## 2023-07-23 NOTE — ED Notes (Signed)
Pt standing in doorway as RN is entering another room yelling demands. RN explained that it will be just a minute, she continued to yell at RN.

## 2023-07-23 NOTE — ED Notes (Signed)
Pt continues to yell at RN stating she needs peanut butter.  RN requested that she lower her voice so that other patients can sleep.

## 2023-07-23 NOTE — ED Notes (Signed)
Using phone. Loud conversation with mother heard throughout department.

## 2023-07-23 NOTE — ED Notes (Signed)
Pt is very manic, w/ fast pressured speech and making many demands.  Pt did start to drink the water w/ her medication.

## 2023-07-23 NOTE — Consult Note (Cosign Needed Addendum)
Telepsych Consultation   Reason for Consult:  "Mania" Referring Physician:  ED Provider Location of Patient:    [facility] ED Location of Provider: Other: virtual home office  Patient Identification: Jacqueline [name] MRN:  [id number] Principal Diagnosis: Hypomanic episode [facility]) Diagnosis:  Principal Problem:   Hypomanic episode (HCC)   Total Time spent with patient: 30 minutes  Subjective:   Jacqueline [name] is a 57 y.o. female patient admitted with  HPI:   Jacqueline [name], 58 y.o., female patient presented to emergency department for psychiatric evaluation after her guardian reported she was making suicidal threats.  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 At that time patient was not recommended for admission because she did not have self harm attempt and at baseline she is impulsive secondary to  her neurocognitive concerns. Since then, patient has been psych cleared, but her former group home declined her return, thus patient has remained a border in the emergency department awaiting placement.  Psychiatry was re-consulted on 10/12 for medication management in the setting of patient becoming more agitated, anxious, and displaying "manic" behaviors and per nursing notes has been more difficult to verbally redirect for the past week.   The patient, Jacqueline [name], was evaluated via telepsych by this provider. The chart was reviewed, and a consultation with Dr. Viviano Simas took place on 07/23/23. During the evaluation, Jacqueline [name] was observed in her exam room, seated and watching TV. Upon noticing the camera, she remarked, "I have sores in my mouth" She is observed applying blistex to her mouth and outer lip region.   The patient was greeted and provided with anticipatory guidance and verbal support. She was alert and oriented to person, place, and situation.[name] was quite talkative,  frequently straying into unrelated topics, and appeared to lack insight regarding these digressions.She also had very poor impulse control and continues to abruptly interrupt midsentence.    Jacqueline [name] seemed easily distracted. When asked about her documented agitation and behavioral issues, she states she is doing well. SHe states she "would love to go home to her mother (79yo) or her daughter Konrad Felix house. I can be safe and do what I need too, please dont send me too far away."   She denied any suicidal or homicidal thoughts, claiming she had "never" felt that way and suggested her guardian fabricated such claims to have her admitted to the hospital. [name] states" the gorup home manager said I know how to get you out of here. You leaving out of here today. The next thing I know she was calling the police. 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   During the evaluation, [name] was cooperative, though anxious and hyperactive, while seated in her exam room. She was alert and oriented to person, place, and situation. Her mood and affect appeared congruent, with clear speech delivered at an increased volume. Her speech was pressured, and she maintained good eye contact. Thought processes were linear, with no evidence of responding to internal or external stimuli or experiencing delusional thoughts. Caral continued to deny any suicidal, self-harm, or homicidal ideation. Throughout the assessment, she remained anxious but cooperative, answering questions appropriately.  Per ED Provider Admissions Assessment 07/11/2023: Chief Complaint              Psychiatric Evaluation  History of Present Illness              Jacqueline [name] is a 57 y.o. year-old female with a history of depression presenting to the ED with chief complaint of psychiatric evaluation.   Medication  change about 3 months ago, mood change about 3 weeks ago.  Much more irritable, angry, unpleasant.  Becoming more and more potentially violent, threatening to kill caregiver yesterday, threatening to kill self today.   Past Psychiatric History: as outlined below  Risk to Self:  no Risk to Others: no  Prior Inpatient Therapy: yes  Prior Outpatient Therapy:  yes  Past Medical History:  Past Medical History:  Diagnosis Date   Alcohol abuse    Alcoholism (HCC)    Anemia    Anxiety    Back pain    Benzodiazepine abuse (HCC)    Depression    Hypoglycemia    Hypothyroidism    Narcotic abuse (HCC)    Peptic ulcer    Seizures (HCC)    Suicide attempt (HCC)    multiple times   Thrombocytopenia (HCC) 06/17/2011   Thyroid disease     Past Surgical History:  Procedure Laterality Date   ABDOMINAL SURGERY     CHOLECYSTECTOMY     COLONOSCOPY N/A 10/02/2016   Procedure: COLONOSCOPY;  Surgeon: Sherrilyn Rist, MD;  Location: [facility] ENDOSCOPY;  Service: Gastroenterology;  Laterality: N/A;   ESOPHAGOGASTRODUODENOSCOPY     ESOPHAGOGASTRODUODENOSCOPY N/A 03/23/2013   Procedure: ESOPHAGOGASTRODUODENOSCOPY (EGD);  Surgeon: Meryl Dare, MD;  Location: [facility] ENDOSCOPY;  Service: Endoscopy;  Laterality: N/A;   ESOPHAGOGASTRODUODENOSCOPY N/A 09/18/2016   Procedure: ESOPHAGOGASTRODUODENOSCOPY (EGD);  Surgeon: Violeta Gelinas, MD;  Location: [facility] ENDOSCOPY;  Service: General;  Laterality: N/A;   ESOPHAGOGASTRODUODENOSCOPY N/A 10/01/2016   Procedure: ESOPHAGOGASTRODUODENOSCOPY (EGD);  Surgeon: Jeani Hawking, MD;  Location: [facility] ENDOSCOPY;  Service: Endoscopy;  Laterality: N/A;   GASTRIC BYPASS     PARTIAL COLECTOMY Right 10/03/2016   Procedure: PARTIAL COLECTOMY;  Surgeon: Jimmye Norman, MD;  Location: [facility] OR;  Service: General;  Laterality: Right;   PERCUTANEOUS TRACHEOSTOMY N/A 09/18/2016   Procedure: PERCUTANEOUS TRACHEOSTOMY;  Surgeon: Violeta Gelinas, MD;  Location[facility];  Service: General;  Laterality: N/A;    TUBAL LIGATION     Family History:  Family History  Problem Relation Age of Onset   Alcohol abuse Father    Alcoholism Father    Cancer Other    Family Psychiatric  History: deferred Social History:  Social History   Substance and Sexual Activity  Alcohol Use No   Comment: former     Social History   Substance and Sexual Activity  Drug Use Yes   Types: Oxycodone, Cocaine, Benzodiazepines   Comment: former    Social History   Socioeconomic History   Marital status: Single    Spouse name: Not on file   Number of children: Not on file   Years of education: Not on file   Highest education level: Not on file  Occupational History   Not on file  Tobacco Use   Smoking status: Former    Current packs/day: 0.50    Types: Cigarettes   Smokeless tobacco: Never  Substance and Sexual Activity   Alcohol use: No    Comment: former   Drug use: Yes    Types: Oxycodone, Cocaine, Benzodiazepines    Comment: former   Sexual activity: Yes    Birth control/protection: Surgical  Other Topics Concern   Not on file  Social History Narrative   ** Merged History Encounter **       ** Merged History Encounter **       Social Determinants of Corporate investment banker Strain: Not on file  Food Insecurity: Not on file  Transportation Needs: Not on file  Physical Activity: Not on file  Stress: Not on file  Social Connections: Not on file   Additional Social History:    Allergies:  No Known Allergies  Labs:  Results for orders placed or performed during the hospital encounter of 07/11/23 (from the past 48 hour(s))  CBG monitoring, ED     Status: Abnormal   Collection Time: 07/21/23  7:51 PM  Result Value Ref Range   Glucose-Capillary 125 (H) 70 - 99 mg/dL    Comment: Glucose reference range applies only to samples taken after fasting for at least 8 hours.    Medications:  Current Facility-Administered Medications  Medication Dose Route Frequency Provider Last Rate  Last Admin   acetaminophen (TYLENOL) tablet 650 mg  650 mg Oral Q6H PRN Sabas Sous, MD   650 mg at 07/23/23 0742   albuterol (VENTOLIN HFA) 108 (90 Base) MCG/ACT inhaler 2 puff  2 puff Inhalation Q4H PRN Sabas Sous, MD       bisacodyl (DULCOLAX) EC tablet 5 mg  5 mg Oral Daily PRN Vanetta Mulders, MD   5 mg at 07/23/23 0743   clonazePAM (KLONOPIN) tablet 1 mg  1 mg Oral TID PRN Sabas Sous, MD   1 mg at 07/23/23 1354   divalproex (DEPAKOTE) DR tablet 1,500 mg  1,500 mg Oral QHS Sabas Sous, MD   1,500 mg at 07/22/23 2132   [START ON 07/24/2023] divalproex (DEPAKOTE) DR tablet 750 mg  750 mg Oral Daily Ophelia Shoulder E, NP       escitalopram (LEXAPRO) tablet 10 mg  10 mg Oral Daily Sabas Sous, MD   10 mg at 07/23/23 0913   fluticasone (FLONASE) 50 MCG/ACT nasal spray 1 spray  1 spray Each Nare q morning Sabas Sous, MD   1 spray at 07/23/23 0914   folic acid (FOLVITE) tablet 1 mg  1 mg Oral q morning Sabas Sous, MD   1 mg at 07/23/23 0915   gabapentin (NEURONTIN) capsule 300 mg  300 mg Oral Daily Sabas Sous, MD   300 mg at 07/23/23 0913   gabapentin (NEURONTIN) capsule 600 mg  600 mg Oral QHS Sabas Sous, MD   600 mg at 07/22/23 2134   hydrOXYzine (ATARAX) tablet 25 mg  25 mg Oral TID Sabas Sous, MD   25 mg at 07/23/23 0913   levothyroxine (SYNTHROID) tablet 125 mcg  125 mcg Oral Q0600 Sabas Sous, MD   125 mcg at 07/23/23 0622   meloxicam (MOBIC) tablet 7.5 mg  7.5 mg Oral BID Sabas Sous, MD   7.5 mg at 07/23/23 0913   OLANZapine zydis (ZYPREXA) disintegrating tablet 5 mg  5 mg Oral TID PRN Chales Abrahams, NP   5 mg at 07/23/23 1215   ondansetron (ZOFRAN) tablet 4 mg  4 mg Oral Q8H PRN Eber Hong, MD       pantoprazole (PROTONIX) EC tablet 40 mg  40 mg Oral BID Sabas Sous, MD   40 mg at 07/23/23 0913   polyethylene glycol (MIRALAX / GLYCOLAX) packet 17 g  17 g Oral Daily Sabas Sous, MD   [phone]  g at 07/23/23 1104   QUEtiapine  (SEROQUEL) tablet 200 mg  200 mg Oral QHS Sabas Sous, MD   200 mg at 07/22/23 2134   senna (SENOKOT) tablet 17.2 mg  2 tablet Oral QHS Sabas Sous, MD   17.2 mg at 07/22/23 2133   sodium chloride tablet 1 g  1 g Oral TID Sabas Sous, MD   1 g at 07/23/23 8119   Current Outpatient Medications  Medication Sig Dispense Refill   acetaminophen (TYLENOL) 650 MG CR tablet Take 650 mg by mouth daily as needed for pain.     clonazePAM (KLONOPIN) 0.5 MG tablet Take 2 tablets (1 mg total) by mouth 3 (three) times daily as needed for anxiety. (Patient taking differently: Take 1 mg by mouth in the morning, at noon, and at bedtime.) 15 tablet 0   DEEP SEA NASAL SPRAY 0.65 % nasal spray Place 2 sprays into the nose every 8 (eight) hours as needed for congestion.     divalproex (DEPAKOTE) 500 MG DR tablet Take 500 mg by mouth 2 (two) times daily. 1 tab daily and 3 tabs at bedtime for mood     escitalopram (LEXAPRO) 10 MG tablet Take 10 mg by mouth daily.     fluticasone (FLONASE) 50 MCG/ACT nasal spray Place 1 spray into both nostrils every morning.     folic acid (FOLVITE) 1 MG tablet Take 1 mg by mouth every morning. After breakfast     gabapentin (NEURONTIN) 300 MG capsule Take 300-600 mg by mouth 2 (two) times daily. 1 am 2 pm     hydrOXYzine (ATARAX) 25 MG tablet Take 25 mg by mouth 3 (three) times daily.     levothyroxine (SYNTHROID) 125 MCG tablet Take 125 mcg by mouth daily before breakfast.     meloxicam (MOBIC) 7.5 MG tablet Take 1 tablet (7.5 mg total) by mouth 2 (two) times daily. 60 tablet 5   OLANZapine zydis (ZYPREXA ZYDIS) 5 MG disintegrating tablet Take one tablet (5 mg) in AM, and  two tablets (10 mg) at bedtime (Patient taking differently: Take 5 mg by mouth at bedtime. Take one tablet (5 mg) in AM, and  two tablets (10 mg) at bedtime) 90 tablet 0   pantoprazole (PROTONIX) 40 MG tablet Take 40 mg by mouth 2 (two) times daily.     polyethylene glycol (MIRALAX / GLYCOLAX) 17 g packet  Take 17 g by mouth daily.     QUEtiapine (SEROQUEL) 200 MG tablet Take 1 tablet (200 mg total) by mouth at bedtime. 30 tablet 0   senna (SENOKOT) 8.6 MG TABS tablet Take 2 tablets by mouth at bedtime.     simethicone (MYLICON) 80 MG chewable tablet Chew 160 mg by mouth 2 (two) times daily. gel     sodium chloride 1 g tablet Take 1 g by mouth 3 (three) times daily.     VENTOLIN HFA 108 (90 Base) MCG/ACT inhaler Inhale 2 puffs into the lungs every 4 (four) hours as needed for wheezing.      Musculoskeletal: pt moves all extremities and ambulates independently.  Strength & Muscle Tone: within normal limits Gait & Station: normal Patient leans: N/A  Psychiatric Specialty Exam:  Presentation  General Appearance:  Appropriate for Environment; Casual  Eye Contact: Good  Speech: Pressured  Speech Volume: Normal  Handedness:Right   Mood and Affect  Mood: Anxious; Euthymic  Affect: Full Range   Thought Process  Thought Processes: Coherent; Linear  Descriptions of  Associations:Circumstantial  Orientation:Partial  Thought Content:Rumination; Tangential  History of Schizophrenia/Schizoaffective disorder:No data recorded Duration of Psychotic Symptoms:No data recorded Hallucinations:Hallucinations: None  Ideas of Reference:None  Suicidal Thoughts:Suicidal Thoughts: No  Homicidal Thoughts:Homicidal Thoughts: No   Sensorium  Memory: Immediate Fair; Recent Fair; Remote Poor  Judgment: Poor  Insight: Poor   Executive Functions  Concentration: Fair  Attention Span: Fair  Recall: Fair  Fund of Knowledge: Fair  Language: Fair   Psychomotor Activity  Psychomotor Activity:Psychomotor Activity: Increased   Assets  Assets:Communication Skills; Desire for Improvement; Financial Resources/Insurance   Sleep  Sleep:Sleep: Fair Number of Hours of Sleep: 6 (per patient)    Physical Exam: Physical Exam Vitals and nursing note reviewed.   Constitutional:      Appearance: Normal appearance. She is obese.  Cardiovascular:     Rate and Rhythm: Normal rate.     Pulses: Normal pulses.  Pulmonary:     Effort: Pulmonary effort is normal.  Musculoskeletal:        General: Normal range of motion.     Cervical back: Normal range of motion.  Neurological:     General: No focal deficit present.     Mental Status: She is alert and oriented to person, place, and time. Mental status is at baseline.  Psychiatric:        Attention and Perception: Attention and perception normal.        Mood and Affect: Mood is anxious. Affect is blunt.        Speech: Speech is rapid and pressured and tangential.        Behavior: Behavior is hyperactive. Behavior is cooperative.        Thought Content: Thought content normal.        Cognition and Memory: Memory normal. Cognition is impaired.        Judgment: Judgment is impulsive and inappropriate.    Review of Systems  Constitutional: Negative.   HENT: Negative.    Eyes: Negative.   Respiratory: Negative.    Cardiovascular: Negative.   Gastrointestinal: Negative.   Genitourinary: Negative.   Musculoskeletal: Negative.   Skin: Negative.   Neurological: Negative.   Endo/Heme/Allergies: Negative.   Psychiatric/Behavioral: Negative.    All other systems reviewed and are negative.  Blood pressure 112/78, pulse 72, temperature 98.6 F (37 C), resp. rate 16, weight 102 kg, last menstrual period 10/09/2016, SpO2 97%. Body mass index is 33.21 kg/m.  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  Patient was initially brought to the emergency department by her guardian for evaluation of suicidal thoughts.  Psychiatry evaluated the patient, did not believe she was a safety concerns, felt her presentation correlated with her dx, and that she was at baseline.  After being psych cleared her group home declined  to take her back, she has since been a border in the emergency department awaiting group home placement.  Psychiatry re consulted to evaluate "mania" behaviors.  On assessment today, patient is very talkative and displays wide range of emotions.  Her speech is pressured but she does allow for interruptions for clarification.  Patient denies suicidal homicidal thoughts and denies AVH.  She does express her agitation is d/t staff not giving her what she wants, her difficulty adhering to directions and accepting boundaries.  She has a hx of TBI with behavioral disturbances she also has a hx for bipolar disorders and her symptoms are suggestive of hypomania.   Medication  Recommendations: Patient with history of TBI, and underlying mania symptoms currently on mood stabilizer and antipsychotic.  -Increase from Divalproex 750mg  po daily.  -Continue Divalproex 1500mg  po at bedtime. -Repeat Depakote Trough level in 5-7 days outpatient.  -Rationale for meds not adjusted: Patient has been given olanzapine 5mg  po TID prn agitation but this does not appear to help her behaviors.  On 07/21/2023 her EKG showed prolonged QT interval of 453/480,so not inclined to increase her olanzapine or change to standing order at this time.  She also takes seroquel 200mg  po daily which can also prolong QT intervals.   She will beenfit from a behavior plan, she endorses coping skills of watching TV, radio, cooking and eating.   Psychiatry to sign off at this time.   Disposition: No evidence of imminent risk to self or others at present.   Patient does not meet criteria for psychiatric inpatient admission. Supportive therapy provided about ongoing stressors. Discussed crisis plan, support from social network, calling 911, coming to the Emergency Department, and calling Suicide Hotline.   This service was provided via telemedicine using a 2-way, interactive audio and video technology.  Names of all persons participating in this  telemedicine service and their role in this encounter. Name: Jacqueline [name] Role: Patient  Name: Caryn Bee  Role: PMHNP  Name: Gretta Cool Role: Psychiatrist    Maryagnes Amos, FNP 07/23/2023 2:17 PM

## 2023-07-23 NOTE — ED Notes (Signed)
Pt standing in front of RN desk demanding more soda and snacks however RN explained that she has already gotten her soda and snacks.  She continued to stand in front of RN yelling that she wanted more.  Security called to assist pt back to room however she went back to the room prior to them arriving.

## 2023-07-23 NOTE — ED Notes (Signed)
Pt has made 1 phone call.

## 2023-07-23 NOTE — ED Notes (Addendum)
Alert, agitated, restless, manic, pressured speech, vocal with repetitive requests/ demands, loud. Sitter present. C/o gum pain, and constipation. Plan process timeframe meds coffee and boundaries explained with rationale.

## 2023-07-23 NOTE — ED Notes (Signed)
CSW reached out to [facility] with Jacqueline [name] who is working on pts placement. CSW awaits updates on placement at this time. TOC to follow.

## 2023-07-23 NOTE — ED Notes (Signed)
Updated. Plan, process, timeframe, meds re-explained with rationale.

## 2023-07-23 NOTE — ED Notes (Signed)
Pt given night medications, was very compliant w/ medications.  She was upset that the department was out of crackers and ice cream.  Pt wanted a soda, diet coke was given.  Pt requested peanut butter which was given.  Pt asked for numbing medicine for mouth however RN explained that it was too soon.  Continues to stand at the door.

## 2023-07-23 NOTE — ED Provider Notes (Signed)
Emergency Medicine Observation Re-evaluation Note  Jacqueline [name] is a 57 y.o. female, seen on rounds today.  Pt initially presented to the ED for complaints of Psychiatric Evaluation Currently, the patient is awaiting placement.  Physical Exam  BP 112/78 (BP Location: Left Arm)   Pulse 72   Temp 98.6 F (37 C)   Resp 16   Wt 102 kg   LMP 10/09/2016 Comment: neg. pregnancy test per RN in chart  SpO2 97%   BMI 33.21 kg/m  Physical Exam Alert and in no acute distress  ED Course / MDM  EKG:EKG Interpretation Date/Time:  Sunday July 21 2023 21:32:21 EDT Ventricular Rate:  66 PR Interval:  162 QRS Duration:  88 QT Interval:  458 QTC Calculation: 480 R Axis:   49  Text Interpretation: Normal sinus rhythm Prolonged QT Abnormal ECG When compared with ECG of 25-Apr-2020 15:22, No significant change was found Confirmed by Tanda Rockers ([id number]) on 07/22/2023 7:21:44 AM  I have reviewed the labs performed to date as well as medications administered while in observation.  Recent changes in the last 24 hours include none.  Plan  Current plan is for placement.    Bethann Berkshire, MD 07/23/23 [phone]

## 2023-07-24 MED ORDER — LORAZEPAM 2 MG/ML IJ SOLN
1.0000 mg | Freq: Once | INTRAMUSCULAR | Status: AC
  Administered 2023-07-24: 1 mg via INTRAMUSCULAR
  Filled 2023-07-24: qty 1

## 2023-07-24 MED ORDER — LORAZEPAM 1 MG PO TABS
2.0000 mg | ORAL_TABLET | Freq: Once | ORAL | Status: AC
  Administered 2023-07-24: 2 mg via ORAL
  Filled 2023-07-24: qty 2

## 2023-07-24 NOTE — ED Notes (Signed)
Patient remained calm and cooperative for more than 10 minutes and provided with TV remote for TV privileges. Patient reminded of her behaviors impacting privileges and this nurse thanked patient for her cooperation

## 2023-07-24 NOTE — ED Notes (Signed)
Pt continues to stand in the doorway asking for PB, crackers, coffee, drinks, security present

## 2023-07-24 NOTE — ED Notes (Signed)
Pt is yelling and screaming, pt was given PRN medication. Pt called this nurse a "bitch" because she would not let her use the phone until she calmed down some.

## 2023-07-24 NOTE — ED Notes (Signed)
Patient yelling into hallway and asking multiple staff members for a coke. This nurse talked with patient about the department not having any Coke at the moment and offered juice or water. Patient requested cranberry juice and Tylenol for her headache, both provided. This nurse discussed her behaviors and boundaries for tv privileges. Patient agreed that if she behaved for , she would be allowed TV privileges. Patient sitting in patient chair in her room at this time.

## 2023-07-24 NOTE — ED Notes (Signed)
Pt very anxious and restless.

## 2023-07-24 NOTE — ED Provider Notes (Signed)
Emergency Medicine Observation Re-evaluation Note  Jacqueline [name] is a 57 y.o. female, seen on rounds today.  Pt initially presented to the ED for complaints of Psychiatric Evaluation Currently, the patient is placement.  Physical Exam  BP (!) 121/59 (BP Location: Left Arm)   Pulse 66   Temp 98 F (36.7 C) (Oral)   Resp 20   Wt 102 kg   LMP 10/09/2016 Comment: neg. pregnancy test per RN in chart  SpO2 100%   BMI 33.21 kg/m  Physical Exam Alert and in no acute distress  ED Course / MDM  EKG:EKG Interpretation Date/Time:  Sunday July 21 2023 21:32:21 EDT Ventricular Rate:  66 PR Interval:  162 QRS Duration:  88 QT Interval:  458 QTC Calculation: 480 R Axis:   49  Text Interpretation: Normal sinus rhythm Prolonged QT Abnormal ECG When compared with ECG of 25-Apr-2020 15:22, No significant change was found Confirmed by Tanda Rockers ([id number]) on 07/22/2023 7:21:44 AM  I have reviewed the labs performed to date as well as medications administered while in observation.  Recent changes in the last 24 hours include none.  Plan  Current plan is for group home placement.    Bethann Berkshire, MD 07/24/23 [phone]

## 2023-07-24 NOTE — Progress Notes (Signed)
Per Maryagnes Amos, FNP pt has  been psych cleared. CSW will now remove from the TTS/ BH shift report.   Maryjean Ka, MSW, [facility] 07/24/2023 7:14 AM

## 2023-07-24 NOTE — ED Notes (Signed)
Pt has been yelling for coffee and peanut butter for the past 30 minutes. It has been explained to her that she is waking all the sick pts in the ED.  Zyprexa given.

## 2023-07-24 NOTE — ED Notes (Signed)
CSW spoke with care coordinator for pt through [facility] who states that he is waiting on responses from some group homes that referrals have been sent to. He is also planning a meeting today with a Sales executive for other placement options.

## 2023-07-25 NOTE — ED Notes (Signed)
Patient received dinner tray

## 2023-07-25 NOTE — ED Notes (Signed)
Pt yelling at this sitter demanding coffee. This sitter told pt she has already had 2 cups of coffee and 3 sodas if she would like juice or water I will give her that. Pt is continuously asking non-stop for coffee and soda. This sitter told pt she will get another soda with lunch.

## 2023-07-25 NOTE — ED Notes (Signed)
Care coordinator with [facility] continues to look for placement. TOC follows for updates.

## 2023-07-25 NOTE — ED Provider Notes (Signed)
Emergency Medicine Observation Re-evaluation Note  Jacqueline [name] is a 57 y.o. female, seen on rounds today.  Pt initially presented to the ED for complaints of Psychiatric Evaluation Currently, the patient is calm and cooperative with no complaints   Physical Exam  BP 111/64 (BP Location: Right Arm)   Pulse 73   Temp 98.4 F (36.9 C) (Oral)   Resp 18   Wt 102 kg   LMP 10/09/2016 Comment: neg. pregnancy test per RN in chart  SpO2 96%   BMI 33.21 kg/m  Physical Exam General: well appearing  Cardiac: Normal rate  Lungs: Normal work of breathing  Psych: Calm and cooperative   ED Course / MDM  EKG:EKG Interpretation Date/Time:  Sunday July 21 2023 21:32:21 EDT Ventricular Rate:  66 PR Interval:  162 QRS Duration:  88 QT Interval:  458 QTC Calculation: 480 R Axis:   49  Text Interpretation: Normal sinus rhythm Prolonged QT Abnormal ECG When compared with ECG of 25-Apr-2020 15:22, No significant change was found Confirmed by Tanda Rockers ([id number]) on 07/22/2023 7:21:44 AM  I have reviewed the labs performed to date as well as medications administered while in observation.  Recent changes in the last 24 hours include none .  Plan  Current plan is for placement.    Coral Spikes, DO 07/25/23 1536

## 2023-07-25 NOTE — ED Notes (Signed)
Pt assisted with a shower, NT set pt up with bathing supplies and clean towels, and clean scrubs

## 2023-07-25 NOTE — ED Notes (Signed)
Pt asked for a snack and drink before dinner. Snack and drink was provided.

## 2023-07-25 NOTE — ED Notes (Signed)
Pt is complaining of pain on her lower gums. Maybe some orajel or similar type local anesthetic would be helpful.

## 2023-07-26 NOTE — ED Notes (Signed)
Pt had peanut butter cup and 2 packs graham crackers and 2 packs saltine crackers. Refused to drink water.

## 2023-07-26 NOTE — ED Notes (Signed)
Pt request stool softener/laxative, refer to [facility] PRN order

## 2023-07-26 NOTE — ED Notes (Signed)
Pt received breakfast tray 

## 2023-07-26 NOTE — Plan of Care (Signed)
[facility] Tonsillectomy/Adenoidectomy, Postoperative PEDS care plan entered in error.

## 2023-07-26 NOTE — ED Notes (Signed)
Requested numbing cream for patients gums.

## 2023-07-26 NOTE — ED Notes (Addendum)
Pt. Received lunch, given a shasta cola

## 2023-07-26 NOTE — ED Notes (Signed)
Pt ambulated to bathroom 

## 2023-07-26 NOTE — ED Provider Notes (Signed)
Emergency Medicine Observation Re-evaluation Note  Jacqueline [name] is a 57 y.o. female, seen on rounds today.  Pt initially presented to the ED for complaints of Psychiatric Evaluation Currently, the patient is awaiting placement.  Physical Exam  BP (!) 107/59 (BP Location: Right Arm)   Pulse 81   Temp 98.2 F (36.8 C) (Oral)   Resp 18   Wt 102 kg   LMP 10/09/2016 Comment: neg. pregnancy test per RN in chart  SpO2 97%   BMI 33.21 kg/m  Physical Exam General: Nontoxic no acute distress Cardiac:  Lungs: No respiratory distress Psych: Baseline  ED Course / MDM  EKG:EKG Interpretation Date/Time:  Sunday July 21 2023 21:32:21 EDT Ventricular Rate:  66 PR Interval:  162 QRS Duration:  88 QT Interval:  458 QTC Calculation: 480 R Axis:   49  Text Interpretation: Normal sinus rhythm Prolonged QT Abnormal ECG When compared with ECG of 25-Apr-2020 15:22, No significant change was found Confirmed by Tanda Rockers ([id number]) on 07/22/2023 7:21:44 AM  I have reviewed the labs performed to date as well as medications administered while in observation.  Recent changes in the last 24 hours include significant events or changes..  Plan  Current plan is for waiting placement.    Vanetta Mulders, MD 07/26/23 [phone]

## 2023-07-26 NOTE — ED Notes (Signed)
Pt received crackers and peanut butter! Also a Shasta cola

## 2023-07-27 NOTE — ED Notes (Addendum)
Pt awake and pacing around room, anxious but redirectable. Pt ambulated to and from bathroom w/o difficulty.

## 2023-07-27 NOTE — ED Provider Notes (Signed)
Emergency Medicine Observation Re-evaluation Note  Jacqueline [name] is a 57 y.o. female, seen on rounds today.  Pt initially presented to the ED for complaints of Psychiatric Evaluation Currently, the patient is calm, eating.  Physical Exam  BP 122/70 (BP Location: Right Arm)   Pulse 72   Temp 98.9 F (37.2 C) (Oral)   Resp 18   Wt 102 kg   LMP 10/09/2016 Comment: neg. pregnancy test per RN in chart  SpO2 97%   BMI 33.21 kg/m  Physical Exam General: nad Cardiac: regular rate Lungs: no resp distress Psych: calm  ED Course / MDM  EKG:EKG Interpretation Date/Time:  Sunday July 21 2023 21:32:21 EDT Ventricular Rate:  66 PR Interval:  162 QRS Duration:  88 QT Interval:  458 QTC Calculation: 480 R Axis:   49  Text Interpretation: Normal sinus rhythm Prolonged QT Abnormal ECG When compared with ECG of 25-Apr-2020 15:22, No significant change was found Confirmed by Tanda Rockers ([id number]) on 07/22/2023 7:21:44 AM  I have reviewed the labs performed to date as well as medications administered while in observation.  Recent changes in the last 24 hours include no changes.  Plan  Current plan is for placement.    Tanda Rockers A, DO 07/27/23 1044

## 2023-07-27 NOTE — ED Notes (Signed)
Pt ambulated to restroom. 

## 2023-07-27 NOTE — ED Notes (Signed)
Assumed care of pt, found her in bed w/ her eyes closed.  There is no signs of distress noted and RN observed slow/steady/regular rise and fall of chest.  Sitter bedside

## 2023-07-28 NOTE — ED Provider Notes (Signed)
Emergency Medicine Observation Re-evaluation Note  Jacqueline [name] is a 57 y.o. female, seen on rounds today.  Pt initially presented to the ED for complaints of Psychiatric Evaluation Currently, the patient is sitting on bed, intermittently shouting at staff.  Physical Exam  BP 107/72 (BP Location: Left Arm)   Pulse 68   Temp 97.7 F (36.5 C) (Oral)   Resp 17   Wt 102 kg   LMP 10/09/2016 Comment: neg. pregnancy test per RN in chart  SpO2 97%   BMI 33.21 kg/m  Physical Exam General: nad Cardiac: regular rate Lungs: equal chest rise Psych: not agitated  ED Course / MDM  EKG:EKG Interpretation Date/Time:  Sunday July 21 2023 21:32:21 EDT Ventricular Rate:  66 PR Interval:  162 QRS Duration:  88 QT Interval:  458 QTC Calculation: 480 R Axis:   49  Text Interpretation: Normal sinus rhythm Prolonged QT Abnormal ECG When compared with ECG of 25-Apr-2020 15:22, No significant change was found Confirmed by Tanda Rockers ([id number]) on 07/22/2023 7:21:44 AM  I have reviewed the labs performed to date as well as medications administered while in observation.  Recent changes in the last 24 hours include no changes.  Plan  Current plan is for placement.    Sloan Leiter, DO 07/28/23 1106

## 2023-07-28 NOTE — ED Notes (Signed)
Food tray given to patient 

## 2023-07-28 NOTE — ED Notes (Signed)
Pt received breakfast tray 

## 2023-07-29 NOTE — ED Notes (Signed)
Social Worker came to visit and let her know they were still working on placement.

## 2023-07-29 NOTE — ED Notes (Signed)
CSW reached out to [facility] care coordinator for pt requesting updates on placement. At this time referral has been sent to multiple IDD/TBI homes. CC continues to follow for responses. TOC to follow.

## 2023-07-30 MED ORDER — DIPHENHYDRAMINE HCL 50 MG/ML IJ SOLN
25.0000 mg | Freq: Once | INTRAMUSCULAR | Status: AC
  Administered 2023-07-30: 25 mg via INTRAMUSCULAR
  Filled 2023-07-30: qty 1

## 2023-07-30 MED ORDER — DROPERIDOL 2.5 MG/ML IJ SOLN
2.5000 mg | Freq: Once | INTRAMUSCULAR | Status: AC
  Administered 2023-07-30: 2.5 mg via INTRAMUSCULAR
  Filled 2023-07-30: qty 2

## 2023-07-30 NOTE — ED Provider Notes (Signed)
Emergency Medicine Observation Re-evaluation Note  Jacqueline [name] is a 57 y.o. female, seen on rounds today.  Pt initially presented to the ED for complaints of Psychiatric Evaluation Currently, the patient is standing in room.  Physical Exam  BP (!) 110/58 (BP Location: Left Arm)   Pulse 66   Temp 98.2 F (36.8 C) (Oral)   Resp 16   Wt 102 kg   LMP 10/09/2016 Comment: neg. pregnancy test per RN in chart  SpO2 98%   BMI 33.21 kg/m  Physical Exam General: Awake, alert, nondistressed Cardiac: Extremities well-perfused Lungs: Breathing is unlabored Psych: No current agitation  ED Course / MDM  EKG:EKG Interpretation Date/Time:  Sunday July 21 2023 21:32:21 EDT Ventricular Rate:  66 PR Interval:  162 QRS Duration:  88 QT Interval:  458 QTC Calculation: 480 R Axis:   49  Text Interpretation: Normal sinus rhythm Prolonged QT Abnormal ECG When compared with ECG of 25-Apr-2020 15:22, No significant change was found Confirmed by Tanda Rockers ([id number]) on 07/22/2023 7:21:44 AM  I have reviewed the labs performed to date as well as medications administered while in observation.  Recent changes in the last 24 hours include continued efforts by social work for placement.  Plan  Current plan is for placement.    Gloris Manchester, MD 07/30/23 [phone]

## 2023-07-30 NOTE — ED Notes (Signed)
Pt in hall yelling at staff. RN attempted to redirect pt, pt refuses to go back in room. MD made aware, awaiting orders.

## 2023-07-30 NOTE — ED Notes (Signed)
CSW reached out to Jacqu[name], care coordinator with [name] requesting an update on placement. CSW awaits response at this time. TOC to follow.

## 2023-07-30 NOTE — ED Notes (Signed)
Pt continues to come to nurses station, yelling at staff, demanding snacks. Pt refuses to stay in room, disrupting other pts in halls trying to sleep.

## 2023-07-30 NOTE — ED Notes (Signed)
Pt agitated and noncompliant to tech request to leave the other patients alone. Medicine given (see MAR) to deescalate agitation after verbal attempts of deescalation were unsuccessful.

## 2023-07-30 NOTE — ED Notes (Signed)
Patient yelling at staff and other patients  because she wants sodas and snacks. Patient constantly coming to desk and disturbing nurses and other patients.

## 2023-07-30 NOTE — ED Notes (Signed)
Pt coming out to nurses station, demanding more snacks, yelling at staff. Pt has been redirected multiple times. Meds given, pt took IM injections willingly.

## 2023-07-30 NOTE — ED Notes (Signed)
Pt has been agitated asking to use the phone, for more saltine crackers, and drink other than water. Pt was told that she can have water and can use the phone and get more snack tomorrow.

## 2023-07-31 NOTE — ED Notes (Signed)
Patient has been asked several times to stay in her room and she keeps disregarding what has been told to her, she has continued to yell and be agitated and approaching the nurses area where other patients are located. In an attempt to have the patient calm down, tv was turned off to reduce excesses stimulation.

## 2023-07-31 NOTE — ED Provider Notes (Signed)
Emergency Medicine Observation Re-evaluation Note  Jacqueline [name] is a 57 y.o. female, seen on rounds today.  Pt initially presented to the ED for complaints of Psychiatric Evaluation Currently, the patient is standing in room.  Physical Exam  BP (!) 110/58 (BP Location: Left Arm)   Pulse 66   Temp 98.2 F (36.8 C) (Oral)   Resp 16   Wt 102 kg   LMP 10/09/2016 Comment: neg. pregnancy test per RN in chart  SpO2 98%   BMI 33.21 kg/m  Physical Exam General: Awake, alert, nondistressed Cardiac: Extremities well-perfused Lungs: Breathing is unlabored Psych: No agitation  ED Course / MDM  EKG:EKG Interpretation Date/Time:  Sunday July 21 2023 21:32:21 EDT Ventricular Rate:  66 PR Interval:  162 QRS Duration:  88 QT Interval:  458 QTC Calculation: 480 R Axis:   49  Text Interpretation: Normal sinus rhythm Prolonged QT Abnormal ECG When compared with ECG of 25-Apr-2020 15:22, No significant change was found Confirmed by Tanda Rockers ([id number]) on 07/22/2023 7:21:44 AM  I have reviewed the labs performed to date as well as medications administered while in observation.  Recent changes in the last 24 hours include intermittent disruptive behavior.  Plan  Current plan is for placement.    Gloris Manchester, MD 07/31/23 [phone]

## 2023-07-31 NOTE — ED Notes (Signed)
Patient used the phone and her mother answered, the mother of the patient stated she was evil and she was not going to answer and then the mother hung up on the patient. The patient did attempt to call again and expressed hurt feelings. Patient has also verbalized now having hurt feelings, feeling anxious and an unset stomach. Patient is getting increasingly restless.

## 2023-07-31 NOTE — ED Notes (Signed)
CSW updated by care coordinator (Adam) with [facility] that pt has possible placement. However there are a few barriers to work through before a move in date can be established. Adam states past billing issues need to be resolved and that current services will need to be approved through [place]. Adam states he got all need signatures and has submitted auth for funding. TOC to follow.

## 2023-07-31 NOTE — ED Notes (Signed)
Patient repeatedly keeps asking to use the phone to call her mom back and is still very agitated.

## 2023-07-31 NOTE — ED Notes (Signed)
Pt asked for tylenol for her tongue sore. Pt has already been given tylenol and her lidocaine mouth wash.

## 2023-07-31 NOTE — ED Notes (Signed)
Pt went to the bathroom.

## 2023-07-31 NOTE — ED Notes (Signed)
Pt ate 100% of her breakfast and lunch, has had 2 juices as well as crackers and peanut butter at 8am and 11am.

## 2023-08-01 NOTE — ED Notes (Signed)
Pt requested PRN tylenol for mouth pain rating of 3/10

## 2023-08-01 NOTE — ED Notes (Signed)
Offered supplies and linen for pt to take a shower x3.  Pt declined.  Will reassess before the end of shift.

## 2023-08-01 NOTE — ED Notes (Signed)
CSW awaiting further updates on transition date to new facility from pts care coordinator Adam with [facility]. TOC to follow.

## 2023-08-01 NOTE — ED Notes (Signed)
Pt received and ate 100% of breakfast tray.

## 2023-08-01 NOTE — ED Notes (Signed)
Patient yelling into hallway. Non-redirectable at this time

## 2023-08-01 NOTE — ED Notes (Signed)
Pt repeating herself to staff and redirection not helpful.

## 2023-08-01 NOTE — ED Notes (Signed)
Pt agreeable to VS being taken.  Complaining of mouth pain.  RN aware.

## 2023-08-01 NOTE — ED Provider Notes (Signed)
Emergency Medicine Observation Re-evaluation Note  Jacqueline [name] is a 57 y.o. female, seen on rounds today.  Pt initially presented to the ED for complaints of episodic agitated behaviors. Patient was brought to ED from ALF, was psych cleared, and was (inappropriately) denied return to ALF without ALF and/or guardian securing new home for patient. Patient remains boarding in ED. No new c/o.   Physical Exam  BP 119/65 (BP Location: Right Arm)   Pulse 66   Temp 98.3 F (36.8 C) (Oral)   Resp 16   Wt 102 kg   LMP 10/09/2016 Comment: neg. pregnancy test per RN in chart  SpO2 95%   BMI 33.21 kg/m  Physical Exam General: calm. Cardiac: regular rate.  Lungs: breathing comfortably. Psych: calm, cooperative.  ED Course / MDM    I have reviewed the labs performed to date as well as medications administered while in observation.  Recent changes in the last 24 hours include ED obs, reassessment.   Plan  Current plan is that [name] is working on placement/new home for patient.     Jacqueline Laine, MD 08/01/23 [phone]

## 2023-08-01 NOTE — ED Notes (Signed)
Pt ate 100% of lunch tray

## 2023-08-02 NOTE — ED Notes (Signed)
Pt given viscous lidocaine at 0731 but has not used it yet. Pt has had water and crackers since then. It was explained to her that once she uses the lidocaine she must not eat or drink for an hour. Pt verbalizes understanding and states that she will use the lidocaine soon.

## 2023-08-02 NOTE — ED Notes (Signed)
Pt awake and ambulated to the bathroom with sitter. Pt asking for food. Pt redirected to room and explained to pt that it is in the middle of the night and not time to eat. Pt in bed

## 2023-08-02 NOTE — ED Notes (Signed)
Patients care coordinator Adam with [facility] continues to work on placement. He is moving forward with a possible placement and working on the funding for this. TOC continues to request daily updates. TOC to follow.

## 2023-08-02 NOTE — ED Notes (Addendum)
Pt allowed to use the telephone and shower, sitter present.

## 2023-08-02 NOTE — ED Provider Notes (Signed)
Emergency Medicine Observation Re-evaluation Note  Jacqueline [name] is a 57 y.o. female, seen on rounds today.  Pt initially presented to the ED for complaints of Psychiatric Evaluation Currently, the patient is asleep. Pt was initially brought in for agitation.  She was psych cleared and was denied return to ALF.  SW working on placement.   Physical Exam  BP 106/71 (BP Location: Right Arm)   Pulse 73   Temp 98.5 F (36.9 C) (Oral)   Resp 20   Wt 102 kg   LMP 10/09/2016 Comment: neg. pregnancy test per RN in chart  SpO2 96%   BMI 33.21 kg/m  Physical Exam General: asleep Cardiac: rr Lungs: clear Psych: asleep  ED Course / MDM  EKG:EKG Interpretation Date/Time:  Sunday July 21 2023 21:32:21 EDT Ventricular Rate:  66 PR Interval:  162 QRS Duration:  88 QT Interval:  458 QTC Calculation: 480 R Axis:   49  Text Interpretation: Normal sinus rhythm Prolonged QT Abnormal ECG When compared with ECG of 25-Apr-2020 15:22, No significant change was found Confirmed by Tanda Rockers ([id number]) on 07/22/2023 7:21:44 AM  I have reviewed the labs performed to date as well as medications administered while in observation.  Recent changes in the last 24 hours include none.  Plan  Current plan is for ALF placement.    Jacalyn Lefevre, MD 08/02/23 [phone]

## 2023-08-03 NOTE — ED Notes (Signed)
Introduced self to pt Pt complains of tooth pain Pt recd APAP around 8am. Pt aware no APAP at this time Gave all 10am meds 5 packs of saltines given at pts request.  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  Sitter to get vitals   Informed MD for MD note

## 2023-08-03 NOTE — ED Notes (Signed)
Pt given ice water and 6 packs of saltines for last snack before bed.

## 2023-08-03 NOTE — ED Provider Notes (Signed)
Emergency Medicine Observation Re-evaluation Note  Jacqueline [name] is a 57 y.o. female, seen on rounds today.  Pt initially presented to the ED for complaints of Psychiatric Evaluation Currently, the patient is calm and cooperative.  Physical Exam  BP 102/70 (BP Location: Left Arm)   Pulse 64   Temp 98.2 F (36.8 C) (Oral)   Resp 18   Wt 102 kg   LMP 10/09/2016 Comment: neg. pregnancy test per RN in chart  SpO2 99%   BMI 33.21 kg/m  Physical Exam General: Awake. Alert. No acute distress Cardiac: Regular rate rhythm Lungs: Clear to auscultation bilaterally Psych: Calm and cooperative  ED Course / MDM  EKG:EKG Interpretation Date/Time:  Sunday July 21 2023 21:32:21 EDT Ventricular Rate:  66 PR Interval:  162 QRS Duration:  88 QT Interval:  458 QTC Calculation: 480 R Axis:   49  Text Interpretation: Normal sinus rhythm Prolonged QT Abnormal ECG When compared with ECG of 25-Apr-2020 15:22, No significant change was found Confirmed by Tanda Rockers ([id number]) on 07/22/2023 7:21:44 AM  I have reviewed the labs performed to date as well as medications administered while in observation.  Recent changes in the last 24 hours include no acute issues.  Plan  Current plan is for continued boarding in the ED.  Awaiting ALF placement    Royanne Foots, DO 08/03/23 1611

## 2023-08-04 NOTE — ED Notes (Signed)
Pt is continuing to disrupt other pts care around them. Pt is not wanting to waiting patiently for meds or nurse to come speak to her. This tech went into room and explained that she needs to wait her turn for the nurse to bring meds and speak to her. And to keep her voice down due to others around her trying to rest. TV privileges are taken away per RN due to behavior from pt.    Pt is currently sitting quietly on the side of bed waiting for RN and to go to bed.

## 2023-08-04 NOTE — ED Notes (Signed)
Pt stepping out of room and disrupting other patients care in the emergency department. Pt requires continuous redirection and security has been called for assistance with redirecting the Pt multiple times this shift. Pt being very loud and boisterous, upsetting other Patients at this time.

## 2023-08-04 NOTE — ED Notes (Signed)
Pt yelling out about snack, pt was given a snack and right after finishing the snack yelled out saying she needs more. Pt also yelling out about lidocaine for gums, SEE MAR, that medication was given but states she needs more, pt getting increasingly more agitated and loud

## 2023-08-04 NOTE — ED Provider Notes (Signed)
Emergency Medicine Observation Re-evaluation Note  Jacqueline [name] is a 57 y.o. female, seen on rounds today.  Pt initially presented to the ED for complaints of Psychiatric Evaluation Currently, the patient is calm and cooperative.  Physical Exam  BP 115/71 (BP Location: Left Arm)   Pulse 61   Temp 98.1 F (36.7 C) (Oral)   Resp 18   Wt 102 kg   LMP 10/09/2016 Comment: neg. pregnancy test per RN in chart  SpO2 98%   BMI 33.21 kg/m  Physical Exam General: Awake. Alert. No acute distress Cardiac: Regular rate rhythm Lungs: Clear to auscultation bilaterally Psych: Calm and cooperative  ED Course / MDM  EKG:EKG Interpretation Date/Time:  Sunday July 21 2023 21:32:21 EDT Ventricular Rate:  66 PR Interval:  162 QRS Duration:  88 QT Interval:  458 QTC Calculation: 480 R Axis:   49  Text Interpretation: Normal sinus rhythm Prolonged QT Abnormal ECG When compared with ECG of 25-Apr-2020 15:22, No significant change was found Confirmed by Tanda Rockers ([id number]) on 07/22/2023 7:21:44 AM  I have reviewed the labs performed to date as well as medications administered while in observation.  Recent changes in the last 24 hours include no acute events overnight.  Plan  Current plan is for continued boarding in the ED awaiting placement.    Royanne Foots, DO 08/04/23 [phone]

## 2023-08-04 NOTE — ED Notes (Signed)
This tech went back into room to talk to the pt and get her to understand that you she is being loud, disrupting others around her and to understand that she is not getting anything else tonight. While talking to her, she wanted to state "she is suicidal, will take water and pour water down her nose to fill up her lungs" She continued to talk and explain that she is suicidal without her music and tv. Explained that due to her behavior that she will not get anything per RN. Pt finally understood and then "forgets" what we talked about and the deal we made. Before leaving room pt wants to say she is suicidal again, she states "I will use my pillow and suffocate myself with a  pillow. Talked to her and explained that is not the behavior to have when she is not getting what she wants. Pt finally got in the bed and under covers. Resting when I left the room.

## 2023-08-04 NOTE — ED Notes (Signed)
Pt given breakfast tray

## 2023-08-04 NOTE — ED Notes (Signed)
Pt given dinner tray.

## 2023-08-04 NOTE — ED Notes (Signed)
Pt continues yelling, not listening, and begging for anything she can upsetting other pts.

## 2023-08-04 NOTE — ED Notes (Signed)
Pt given lunch tray.

## 2023-08-05 NOTE — ED Notes (Signed)
Pt stated that she is going to kill her self after we told her it was not time to eat.

## 2023-08-05 NOTE — ED Notes (Signed)
CSW reached out to CC Adam with [facility] requesting update on date of transition to new placement and if funding has been approved at this time. CSW awaits response at this time. TOC to follow.

## 2023-08-05 NOTE — ED Notes (Signed)
Pt's TV privileges were taken away due to being loud and yelling at staff. Pt then called nurse a bitch. Officer at bedside.

## 2023-08-05 NOTE — ED Provider Notes (Signed)
Emergency Medicine Observation Re-evaluation Note  Jacqueline [name] is a 57 y.o. female, seen on rounds today.  Pt initially presented to the ED for complaints of Psychiatric Evaluation Currently, the patient is sitting in room. Was disruptive earlier this morning and late last night yelling at staff. Reports she will kill herself if she doesn't get to watch tv/listen to music when she wants to.   Physical Exam  BP (!) 99/57 (BP Location: Left Arm)   Pulse 63   Temp 97.9 F (36.6 C) (Oral)   Resp 16   Wt 102 kg   LMP 10/09/2016 Comment: neg. pregnancy test per RN in chart  SpO2 93%   BMI 33.21 kg/m  Physical Exam General: nad Cardiac: regular rate Lungs: no resp distress Psych: calm at this time  ED Course / MDM  EKG:EKG Interpretation Date/Time:  Sunday July 21 2023 21:32:21 EDT Ventricular Rate:  66 PR Interval:  162 QRS Duration:  88 QT Interval:  458 QTC Calculation: 480 R Axis:   49  Text Interpretation: Normal sinus rhythm Prolonged QT Abnormal ECG When compared with ECG of 25-Apr-2020 15:22, No significant change was found Confirmed by Tanda Rockers ([id number]) on 07/22/2023 7:21:44 AM  I have reviewed the labs performed to date as well as medications administered while in observation.  Recent changes in the last 24 hours include no change.  Plan  Current plan is for placement .    Sloan Leiter, DO 08/05/23 [phone]

## 2023-08-06 NOTE — ED Notes (Signed)
This sitter gave pt 3 packs of crackers, 2 peanut butters and a cup water at snack time.

## 2023-08-06 NOTE — ED Notes (Signed)
Patient provided computer to watch Jacqueline [name] on.

## 2023-08-06 NOTE — ED Notes (Signed)
CSW continues to request daily updates from care coordinator pertaining to patients placement status. Funding continues to be pending at this time. TOC to follow.

## 2023-08-06 NOTE — ED Provider Notes (Signed)
Emergency Medicine Observation Re-evaluation Note  Jacqueline [name] is a 57 y.o. female, seen on rounds today.  Pt initially presented to the ED for complaints of Psychiatric Evaluation Currently, the patient is awaiting placement  Physical Exam  BP 124/79   Pulse 73   Temp 98 F (36.7 C)   Resp 20   Wt 102 kg   LMP 10/09/2016 Comment: neg. pregnancy test per RN in chart  SpO2 96%   BMI 33.21 kg/m  Physical Exam Alert and in no acute distress  ED Course / MDM  EKG:EKG Interpretation Date/Time:  Sunday July 21 2023 21:32:21 EDT Ventricular Rate:  66 PR Interval:  162 QRS Duration:  88 QT Interval:  458 QTC Calculation: 480 R Axis:   49  Text Interpretation: Normal sinus rhythm Prolonged QT Abnormal ECG When compared with ECG of 25-Apr-2020 15:22, No significant change was found Confirmed by Tanda Rockers ([id number]) on 07/22/2023 7:21:44 AM  I have reviewed the labs performed to date as well as medications administered while in observation.  Recent changes in the last 24 hours include none.  Plan  Current plan is for placement.    Bethann Berkshire, MD 08/06/23 1028

## 2023-08-06 NOTE — ED Notes (Signed)
Pt received breakfast tray 

## 2023-08-06 NOTE — ED Notes (Signed)
Patient standing at doorway asking what time it is repeatedly. Asked patient to listen to music and try to not think about the time.

## 2023-08-06 NOTE — ED Notes (Signed)
Pt received dinner tray.

## 2023-08-08 MED ORDER — NAPROXEN 250 MG PO TABS
500.0000 mg | ORAL_TABLET | Freq: Once | ORAL | Status: AC
  Administered 2023-08-08: 500 mg via ORAL
  Filled 2023-08-08: qty 2

## 2023-08-08 NOTE — ED Provider Notes (Signed)
Emergency Medicine Observation Re-evaluation Note  Jacqueline [name] is a 57 y.o. female, seen on rounds today.  Pt initially presented to the ED for complaints of Psychiatric Evaluation Currently, the patient is standing in doorway talking.  Physical Exam  BP 134/78 (BP Location: Right Arm)   Pulse 70   Temp 99.3 F (37.4 C) (Oral)   Resp 18   Wt 102 kg   LMP 10/09/2016 Comment: neg. pregnancy test per RN in chart  SpO2 95%   BMI 33.21 kg/m  Physical Exam General: calm, compliant  ED Course / MDM  EKG:EKG Interpretation Date/Time:  Sunday July 21 2023 21:32:21 EDT Ventricular Rate:  66 PR Interval:  162 QRS Duration:  88 QT Interval:  458 QTC Calculation: 480 R Axis:   49  Text Interpretation: Normal sinus rhythm Prolonged QT Abnormal ECG When compared with ECG of 25-Apr-2020 15:22, No significant change was found Confirmed by Tanda Rockers ([id number]) on 07/22/2023 7:21:44 AM  I have reviewed the labs performed to date as well as medications administered while in observation.  Recent changes in the last 24 hours include-no changes.   Plan  Current plan is for pending placement status.    Franne Forts, DO 08/08/23 [phone]

## 2023-08-08 NOTE — ED Notes (Signed)
CSW continues to await update from [facility] on plan for transition to new facility. TOC to follow.

## 2023-08-08 NOTE — ED Notes (Signed)
Pt being loud and asking for medications and something to drink. This RN gave pt a gingerale and had a conversation with pt as when next medications to be given. Pt understood and agreed to try and keep her voice to an inside voice tone.

## 2023-08-09 MED ORDER — LIDOCAINE VISCOUS HCL 2 % MT SOLN: 15.0000 mL | Freq: Once | OROMUCOSAL | Status: DC

## 2023-08-09 NOTE — ED Notes (Signed)
Patient complains of mouth pain in front lower and tongue. Pt complains that it hurts to wear her lower dentures.

## 2023-08-09 NOTE — ED Notes (Signed)
Pt has open sores or scrapes on her face near her chin maybe from rubbing her skin or another irritation.

## 2023-08-09 NOTE — ED Notes (Signed)
Pt provided 3rd phonecall at this time and is aware no further phonecalls can be made after this.

## 2023-08-09 NOTE — ED Notes (Signed)
Transition date is still pending at this time. CSW continues to follow up with care coordinator with [facility] for updates. TOC to follow.

## 2023-08-09 NOTE — ED Notes (Signed)
Pt requires multiple attempts to be verbally redirected. Pt attempting to leave room multiple time. Pt yelling loudly, disrupting other Patient care in [facility].

## 2023-08-09 NOTE — ED Provider Notes (Signed)
Emergency Medicine Observation Re-evaluation Note  Jacqueline [name] is a 57 y.o. female, seen on rounds today.  Pt initially presented to the ED for complaints of Psychiatric Evaluation Currently, the patient is calm, talking to RN.  Physical Exam  BP 106/82 (BP Location: Right Arm)   Pulse 79   Temp 97.8 F (36.6 C) (Oral)   Resp 18   Wt 102 kg   LMP 10/09/2016 Comment: neg. pregnancy test per RN in chart  SpO2 95%   BMI 33.21 kg/m  Physical Exam General: no distress Cardiac: regular rate Lungs: equal chest rise Psych: calm  ED Course / MDM  EKG:EKG Interpretation Date/Time:  Sunday July 21 2023 21:32:21 EDT Ventricular Rate:  66 PR Interval:  162 QRS Duration:  88 QT Interval:  458 QTC Calculation: 480 R Axis:   49  Text Interpretation: Normal sinus rhythm Prolonged QT Abnormal ECG When compared with ECG of 25-Apr-2020 15:22, No significant change was found Confirmed by Tanda Rockers ([id number]) on 07/22/2023 7:21:44 AM  I have reviewed the labs performed to date as well as medications administered while in observation.  Recent changes in the last 24 hours include none.  Plan  Current plan is for placement.    Lonell Grandchild, MD 08/09/23 [phone]

## 2023-08-09 NOTE — ED Notes (Signed)
Pt provided hygiene products and provided time to shower. Pt did attempt to walk out of restroom/shower nude, and quickly needed to be redirected by [facility] Staff to re-enter the restroom and clothe herself before exiting the restroom.

## 2023-08-09 NOTE — ED Notes (Signed)
Pt was allowed to make 2 phone calls to family members. She left messages for them.

## 2023-08-10 NOTE — ED Provider Notes (Signed)
Emergency Medicine Observation Re-evaluation Note  Jacqueline [name] is a 56 y.o. female, seen on rounds today.  Pt initially presented to the ED for complaints of Psychiatric Evaluation Currently, the patient is calm, asking RN for food.   Physical Exam  BP 106/82 (BP Location: Right Arm)   Pulse 79   Temp 97.8 F (36.6 C) (Oral)   Resp 18   Wt 102 kg   LMP 10/09/2016 Comment: neg. pregnancy test per RN in chart  SpO2 95%   BMI 33.21 kg/m  Physical Exam General: no distress Cardiac: regular rate Lungs: equal chest rise Psych: calm  ED Course / MDM  EKG:EKG Interpretation Date/Time:  Sunday July 21 2023 21:32:21 EDT Ventricular Rate:  66 PR Interval:  162 QRS Duration:  88 QT Interval:  458 QTC Calculation: 480 R Axis:   49  Text Interpretation: Normal sinus rhythm Prolonged QT Abnormal ECG When compared with ECG of 25-Apr-2020 15:22, No significant change was found Confirmed by Tanda Rockers ([id number]) on 07/22/2023 7:21:44 AM  I have reviewed the labs performed to date as well as medications administered while in observation.  Recent changes in the last 24 hours include none.  Plan  Current plan is for placement.    Lonell Grandchild, MD 08/10/23 [phone]

## 2023-08-11 MED ORDER — PHENOL 1.4 % MT LIQD
1.0000 | OROMUCOSAL | Status: DC | PRN
  Administered 2023-08-11: 1 via OROMUCOSAL
  Filled 2023-08-11: qty 177

## 2023-08-11 MED ORDER — ACETAMINOPHEN 325 MG PO TABS
650.0000 mg | ORAL_TABLET | Freq: Once | ORAL | Status: AC
  Administered 2023-08-11: 650 mg via ORAL
  Filled 2023-08-11: qty 2

## 2023-08-11 MED ORDER — OLANZAPINE 5 MG PO TABS
5.0000 mg | ORAL_TABLET | Freq: Once | ORAL | Status: AC
  Administered 2023-08-11: 5 mg via ORAL
  Filled 2023-08-11: qty 1

## 2023-08-11 NOTE — ED Notes (Addendum)
Chart in error
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
## 2023-08-11 NOTE — ED Notes (Signed)
Pt has picked at her face and caused redness and swelling around mouth. Pt also keeps stating she has gum infection and wants antibiotic. EDP notified.

## 2023-08-11 NOTE — ED Notes (Signed)
Pt stated to this tech "If the nurse does not give me pain medicine I'm going to leave and go get it off the street"

## 2023-08-11 NOTE — ED Provider Notes (Signed)
Emergency Medicine Observation Re-evaluation Note  Jacqueline [name] is a 57 y.o. female, seen on rounds today.  Pt initially presented to the ED for complaints of Psychiatric Evaluation Currently, the patient is resting comfortably.  No complaints from the patient.  Physical Exam  BP 106/65 (BP Location: Right Arm)   Pulse 66   Temp 98.2 F (36.8 C) (Oral)   Resp 18   Wt 102 kg   LMP 10/09/2016 Comment: neg. pregnancy test per RN in chart  SpO2 97%   BMI 33.21 kg/m  Physical Exam General: No distress Cardiac: Regular rate Lungs: No respiratory distress Psych: Currently calm  ED Course / MDM  EKG:EKG Interpretation Date/Time:  Sunday July 21 2023 21:32:21 EDT Ventricular Rate:  66 PR Interval:  162 QRS Duration:  88 QT Interval:  458 QTC Calculation: 480 R Axis:   49  Text Interpretation: Normal sinus rhythm Prolonged QT Abnormal ECG When compared with ECG of 25-Apr-2020 15:22, No significant change was found Confirmed by Tanda Rockers ([id number]) on 07/22/2023 7:21:44 AM  I have reviewed the labs performed to date as well as medications administered while in observation.  Recent changes in the last 24 hours include no new changes.  TOC team is seeking placement.  Plan  Current plan is for holding patient for placement.    Derwood Kaplan, MD 08/11/23 [phone]

## 2023-08-12 MED ORDER — STERILE WATER FOR INJECTION IJ SOLN
INTRAMUSCULAR | Status: AC
  Administered 2023-08-12: 1.2 mL
  Filled 2023-08-12: qty 10

## 2023-08-12 MED ORDER — LORAZEPAM 2 MG/ML IJ SOLN
2.0000 mg | Freq: Once | INTRAMUSCULAR | Status: AC
  Administered 2023-08-12: 2 mg via INTRAMUSCULAR
  Filled 2023-08-12: qty 1

## 2023-08-12 MED ORDER — IBUPROFEN 400 MG PO TABS
400.0000 mg | ORAL_TABLET | Freq: Once | ORAL | Status: AC
  Administered 2023-08-12: 400 mg via ORAL
  Filled 2023-08-12: qty 1

## 2023-08-12 MED ORDER — ZIPRASIDONE MESYLATE 20 MG IM SOLR
10.0000 mg | Freq: Once | INTRAMUSCULAR | Status: AC
  Administered 2023-08-12: 10 mg via INTRAMUSCULAR
  Filled 2023-08-12: qty 20

## 2023-08-12 MED ORDER — LORAZEPAM 1 MG PO TABS
1.0000 mg | ORAL_TABLET | Freq: Once | ORAL | Status: AC
  Administered 2023-08-12: 1 mg via ORAL
  Filled 2023-08-12: qty 1

## 2023-08-12 NOTE — ED Notes (Signed)
Pt continues to yell at staff and agitated.  Pt continues to complain of gum pain and dry lips.  Pt continues to touch and pick her face throughout the day. Pt was offered a small amount of vaseline for her lips earlier today, pt with vaseline still in room and requesting more.

## 2023-08-12 NOTE — ED Provider Notes (Addendum)
Emergency Medicine Observation Re-evaluation Note  EMERLY PRAK is a 57 y.o. female, seen on rounds today.   Pt initially presented to the ED for complaints of episodic agitated behaviors. Patient was brought to ED from [facility], was psych cleared, and was (inappropriately) denied return to ALF without ALF and/or guardian securing new home for patient. Patient remains boarding in ED. No new c/o.   Physical Exam  BP 110/62 (BP Location: Right Arm)   Pulse 75   Temp 98.3 F (36.8 C) (Oral)   Resp 18   Wt 102 kg   LMP 10/09/2016 Comment: neg. pregnancy test per RN in chart  SpO2 97%   BMI 33.21 kg/m  Physical Exam General: calm. Cardiac: regular rate.  Lungs: breathing comfortably. Psych: calm  ED Course / MDM    I have reviewed the labs performed to date as well as medications administered while in observation.  Recent changes in the last 24 hours include ed obs, reassessment.   Plan  Current plan is toc working with [facility] for new placement. It appears currently delay as [facility] working on funding (not entirely clear why prolonged delay as patient and her needs are by no means new to [facility]/system).  It appears transition to new facility needs to be expedited by [facility]/TOC team.     Cathren Laine, MD 08/12/23 [phone]  Pt with mild dermatitis to patch of skin left side of chin. No cellulitis. Staff notes pt repetitively putting various creams, lidocaine jelly, and picking at area. Pt encouraged to avoid rubbing and/or picking at area, and that continually applying creams/lotions may be making worse, so for now, rec avoiding additional creams/lotions to area, keep clean/dry.   Staff indicates pt  appears chronically hyperverbal, anxious/worried, mildly agitated. Given current symptoms and behaviors noted by caregiver pta (and caregiver feeling home meds were not optimized to help patients behaviors), will ask [facility] team to reassess in terms of possible scheduled medication adjustment to  better manage behavioral symptoms (as opposed to giving prn meds for agitation on chronic basis).       Cathren Laine, MD 08/12/23 [phone]

## 2023-08-12 NOTE — ED Notes (Addendum)
Pt standing in doorway, pt states that she would like something for the pain in her mouth and face, pt requests tylenol, goody powder, lidocaine, and states that she doesn't want a narcotic.  Pt is rubbing chap stick on her chin.  Pt appears to have a rash on her chin, redness and some skin irritation.  MD notified and states that he will see pt shortly.  Informed pt of the concerns of continuing to rub chap stick on her chin and potentially make the skin irritation worse, pt states that she is agitated by this. Offered PRN Zyprexa for agitation.  Pt states that she would like her Zyprexa.  Offered PRN tylenol for her pain, pt accepts.  Pt states that her pain is a 10/10, then states that her pain is a 3/10, MD at bedside evaluating pt.

## 2023-08-12 NOTE — ED Notes (Signed)
Pt sitting in bed, pt denies si or hi at this time, pt continues to c/o oral pain, meds given, pt has pressured speech and continues to be fixated on lidocaine or an abx for her mouth, reminded pt that she was recently seen by the md and he had dc her lidocaine and has not written for her any abx.  Ice chips given for pt comfort, explained that gabapentin may help with her pain, but if she continues to have pain I would talk with the md again, pt states that the tylenol has helped.

## 2023-08-12 NOTE — ED Notes (Signed)
CSW spoke with new leal guardian with [facility] Jacqueline [name]) to follow up on placement. He states that he will make sure documents that are needed for [facility] will be sent. CSW reached out to [facility] and continues to await an update at this time. TOC to follow.

## 2023-08-12 NOTE — ED Notes (Signed)
Pt returned to room by security, pt agitated, offered PRN Klonopin, pt accepted

## 2023-08-13 LAB — COMPREHENSIVE METABOLIC PANEL
ALT: 12 U/L (ref 0–44)
AST: 13 U/L — ABNORMAL LOW (ref 15–41)
Albumin: 3.2 g/dL — ABNORMAL LOW (ref 3.5–5.0)
Alkaline Phosphatase: 52 U/L (ref 38–126)
Anion gap: 7 (ref 5–15)
BUN: 22 mg/dL — ABNORMAL HIGH (ref 6–20)
CO2: 25 mmol/L (ref 22–32)
Calcium: 8.5 mg/dL — ABNORMAL LOW (ref 8.9–10.3)
Chloride: 105 mmol/L (ref 98–111)
Creatinine, Ser: 0.83 mg/dL (ref 0.44–1.00)
GFR, Estimated: >60 mL/min (ref >60)
Glucose, Bld: 86 mg/dL (ref 70–99)
Potassium: 4.2 mmol/L (ref 3.5–5.1)
Sodium: 137 mmol/L (ref 135–145)
Total Bilirubin: 0.3 mg/dL (ref <1.2)
Total Protein: 5.8 g/dL — ABNORMAL LOW (ref 6.5–8.1)

## 2023-08-13 LAB — VALPROIC ACID LEVEL: Valproic Acid Lvl: 63 ug/mL (ref 50.0–100.0)

## 2023-08-13 MED ORDER — QUETIAPINE FUMARATE 25 MG PO TABS
25.0000 mg | ORAL_TABLET | Freq: Two times a day (BID) | ORAL | Status: DC
  Administered 2023-08-13 – 2023-08-29 (×33): 25 mg via ORAL
  Filled 2023-08-13 (×33): qty 1

## 2023-08-13 MED ORDER — HALOPERIDOL LACTATE 5 MG/ML IJ SOLN
5.0000 mg | Freq: Once | INTRAMUSCULAR | Status: AC
  Administered 2023-08-13: 5 mg via INTRAMUSCULAR
  Filled 2023-08-13: qty 1

## 2023-08-13 MED ORDER — DROPERIDOL 2.5 MG/ML IJ SOLN: 2.5000 mg | Freq: Once | INTRAMUSCULAR | Status: DC

## 2023-08-13 MED ORDER — LORAZEPAM 2 MG/ML IJ SOLN
1.0000 mg | Freq: Once | INTRAMUSCULAR | Status: AC
  Administered 2023-08-13: 1 mg via INTRAMUSCULAR
  Filled 2023-08-13: qty 1

## 2023-08-13 MED ORDER — LORAZEPAM 1 MG PO TABS
2.0000 mg | ORAL_TABLET | Freq: Once | ORAL | Status: AC
  Administered 2023-08-13: 2 mg via ORAL
  Filled 2023-08-13: qty 2

## 2023-08-13 MED ORDER — DIPHENHYDRAMINE HCL 50 MG/ML IJ SOLN
25.0000 mg | Freq: Once | INTRAMUSCULAR | Status: AC
  Administered 2023-08-13: 25 mg via INTRAMUSCULAR
  Filled 2023-08-13: qty 1

## 2023-08-13 MED ORDER — MIDAZOLAM HCL 5 MG/5ML IJ SOLN
2.0000 mg | Freq: Once | INTRAMUSCULAR | Status: AC
  Administered 2023-08-13: 2 mg via INTRAMUSCULAR
  Filled 2023-08-13: qty 5

## 2023-08-13 MED ORDER — DIVALPROEX SODIUM 500 MG PO DR TAB
1000.0000 mg | DELAYED_RELEASE_TABLET | Freq: Every day | ORAL | Status: DC
  Administered 2023-08-13 – 2023-08-29 (×16): 1000 mg via ORAL
  Filled 2023-08-13 (×4): qty 4
  Filled 2023-08-13: qty 2
  Filled 2023-08-13: qty 4
  Filled 2023-08-13 (×2): qty 2
  Filled 2023-08-13 (×2): qty 4
  Filled 2023-08-13: qty 2
  Filled 2023-08-13: qty 4
  Filled 2023-08-13 (×7): qty 2

## 2023-08-13 NOTE — ED Notes (Signed)
Pt continues to yell. EDP notified and haldol injection administered. Pt now complains of gum pain. Tylenol administered.

## 2023-08-13 NOTE — ED Provider Notes (Signed)
Emergency Medicine Observation Re-evaluation Note  Jacqueline [name] is a 57 y.o. female, seen on rounds today.  Pt initially presented to the ED for complaints of Psychiatric Evaluation Currently, the patient is awake, screaming and yelling at staff.  Physical Exam  BP 103/60 (BP Location: Left Arm)   Pulse 77   Temp 98.2 F (36.8 C) (Oral)   Resp 17   Wt 102 kg   LMP 10/09/2016 Comment: neg. pregnancy test per RN in chart  SpO2 96%   BMI 33.21 kg/m  Physical Exam Vitals and nursing note reviewed.  Constitutional:      General: She is not in acute distress.    Appearance: She is well-developed.  HENT:     Head: Normocephalic and atraumatic.  Eyes:     Conjunctiva/sclera: Conjunctivae normal.  Cardiovascular:     Rate and Rhythm: Normal rate and regular rhythm.     Heart sounds: No murmur heard. Pulmonary:     Effort: Pulmonary effort is normal. No respiratory distress.     Breath sounds: Normal breath sounds.  Abdominal:     Palpations: Abdomen is soft.     Tenderness: There is no abdominal tenderness.  Musculoskeletal:        General: No swelling.     Cervical back: Neck supple.  Skin:    General: Skin is warm and dry.     Capillary Refill: Capillary refill takes less than 2 seconds.  Neurological:     Mental Status: She is alert.  Psychiatric:     Comments: Agitated      ED Course / MDM  EKG:EKG Interpretation Date/Time:  Sunday July 21 2023 21:32:21 EDT Ventricular Rate:  66 PR Interval:  162 QRS Duration:  88 QT Interval:  458 QTC Calculation: 480 R Axis:   49  Text Interpretation: Normal sinus rhythm Prolonged QT Abnormal ECG When compared with ECG of 25-Apr-2020 15:22, No significant change was found Confirmed by Tanda Rockers ([id number]) on 07/22/2023 7:21:44 AM  I have reviewed the labs performed to date as well as medications administered while in observation.  Recent changes in the last 24 hours include continued social work placement.  Plan   Current plan is for placement.    Glendora Score, MD 08/13/23 0730

## 2023-08-13 NOTE — ED Notes (Signed)
Pt still yelling out. Sitter and security sitting in rm to keep from disturbing other pts
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
## 2023-08-13 NOTE — ED Notes (Signed)
Call made to pharmacy regarding droperidol order. Pharmacy states EKG may be needed prior to giving droperidol. Pharmacy states they will message EDP regarding medication. Dose held for now.

## 2023-08-13 NOTE — ED Notes (Signed)
Pt continues to yell out at staff.

## 2023-08-13 NOTE — ED Notes (Signed)
Pt continues to yell demanding crackers and lidocaine. Staff attempting to calm pt unsuccessfully. Pt informed she can have crackers after 0900 for good behavior. Pt continues to yell.

## 2023-08-13 NOTE — ED Notes (Signed)
Pt yelling in hallway and refusing to go to room. Pt escorted by security back to room. Pt continues to yell at staff and security. Medication administered.

## 2023-08-13 NOTE — ED Notes (Signed)
CSW spoke to CC with [facility] who states that they continue to await a move out day for pt. TOC to follow.

## 2023-08-14 NOTE — ED Notes (Signed)
Pt is showing attention seeking behaviors. Pt received tylenol for pain and nurse told pt to elevate feet to help with pain in legs and feet. As soon as nurse walks out the  room to attend to another pt, the pt begins  yelling for the nurse, stating she needs something for pain. Pt then begins yelling, my mouth hurts I need crackers for my mouth, my legs hurt, I need the nurse to come look at me'. Nurse went back into the room to explain she has assessed her mouth, gums, legs 2 times today already.

## 2023-08-14 NOTE — ED Notes (Signed)
Pt asked to use the phone to call her daughters. Pt  allowed to use the phone under sitter supervision. Pt understands that this will be the last phone call of the night. Pt is calm and cooperative at this time

## 2023-08-14 NOTE — ED Notes (Signed)
Pt awake, ambulated to restroom for shower.

## 2023-08-14 NOTE — ED Notes (Addendum)
Gums and mouth assessed. No redness, or swelling noted. Pt continue to yell requesting, lidocaine for her gums and requesting crackers . Nurse informed pt if gums are sore crackers are not a good thing to have at this moment. Nurse offered salt water rinse. Pt continued to yell after nurse walked out to get water rinse. Redirection for yelling is not effective.

## 2023-08-14 NOTE — ED Provider Notes (Signed)
Emergency Medicine Observation Re-evaluation Note  Jacqueline [name] is a 57 y.o. female, seen on rounds today.  Pt initially presented to the ED for complaints of Psychiatric Evaluation Currently, the patient is calm and cooperative.   Physical Exam  BP (!) 142/88 (BP Location: Right Arm)   Pulse 71   Temp 98.1 F (36.7 C) (Oral)   Resp 17   Wt 102 kg   LMP 10/09/2016 Comment: neg. pregnancy test per RN in chart  SpO2 98%   BMI 33.21 kg/m  Physical Exam General: NAD  Cardiac: Normal HR Lungs: nonlabored WOB  Psych: Calm   ED Course / MDM  EKG:EKG Interpretation Date/Time:  Sunday July 21 2023 21:32:21 EDT Ventricular Rate:  66 PR Interval:  162 QRS Duration:  88 QT Interval:  458 QTC Calculation: 480 R Axis:   49  Text Interpretation: Normal sinus rhythm Prolonged QT Abnormal ECG When compared with ECG of 25-Apr-2020 15:22, No significant change was found Confirmed by Tanda Rockers ([id number]) on 07/22/2023 7:21:44 AM  I have reviewed the labs performed to date as well as medications administered while in observation.  Recent changes in the last 24 hours include none;   Plan  Current plan is for placement.     Coral Spikes, DO 08/14/23 1609

## 2023-08-14 NOTE — ED Notes (Signed)
CSW updated by [facility] care coordinator with [facility] that the provider for the [facility] pt will be going to is out until Friday. Once she arrives back in town [facility] will do a walk through of the residence. At this time [facility] cannot give CSW a date of transition. TOC continues to follow.

## 2023-08-14 NOTE — ED Notes (Signed)
Pt yelling threatening to leave . Pt received several crackers , peanut butter, and extra coffee during breakfast. Pt is yelling she wants more. Pt then attempted to run out of her room stating she is going to leave. Security redirected pt back to her room.

## 2023-08-15 MED ORDER — STERILE WATER FOR INJECTION IJ SOLN
INTRAMUSCULAR | Status: AC
  Administered 2023-08-15: 10 mL
  Filled 2023-08-15: qty 10

## 2023-08-15 MED ORDER — ZIPRASIDONE MESYLATE 20 MG IM SOLR
20.0000 mg | Freq: Once | INTRAMUSCULAR | Status: AC
  Administered 2023-08-15: 20 mg via INTRAMUSCULAR
  Filled 2023-08-15: qty 20

## 2023-08-15 NOTE — ED Notes (Signed)
Patient repeatedly screaming into the hallway for saltine crackers, phone privileges, coffee, laxatives, a "pain shot", socks, and chap stick.  Patient is currently in possession of these items, and aware of the fact that she is not allowed to have more food and coffee at this time.    Pt states "I'm never gonna stop screaming!"  Paramedic aware at this time.

## 2023-08-15 NOTE — ED Notes (Signed)
Patient repeated screaming into hallway.  Patient is not redirectable.  Primary paramedic is aware.

## 2023-08-15 NOTE — ED Notes (Signed)
Pt is becoming increasingly agitated and stating she is in pain and that she needs coffee to help relieve it. Pt is also stating she needs saltines to help with the pain.

## 2023-08-15 NOTE — ED Notes (Signed)
Patient states to this tech "I'm gonna kill myself if I don't get what I want!".    Paramedic aware.

## 2023-08-16 NOTE — ED Provider Notes (Signed)
Emergency Medicine Observation Re-evaluation Note  Jacqueline [name] is a 57 y.o. female, seen on rounds today.  Pt initially presented to the ED for complaints of Psychiatric Evaluation Currently, the patient is resting comfortably.  Physical Exam  BP 102/78 (BP Location: Left Arm)   Pulse 82   Temp 98 F (36.7 C) (Oral)   Resp 14   Wt 102 kg   LMP 10/09/2016 Comment: neg. pregnancy test per RN in chart  SpO2 98%   BMI 33.21 kg/m  Physical Exam Vitals and nursing note reviewed.  Constitutional:      General: She is not in acute distress.    Appearance: She is well-developed.  HENT:     Head: Normocephalic and atraumatic.  Eyes:     Conjunctiva/sclera: Conjunctivae normal.  Pulmonary:     Effort: Pulmonary effort is normal. No respiratory distress.  Musculoskeletal:        General: No swelling.     Cervical back: Neck supple.  Skin:    General: Skin is warm and dry.     Capillary Refill: Capillary refill takes less than 2 seconds.  Neurological:     Mental Status: She is alert.  Psychiatric:        Mood and Affect: Mood normal.      ED Course / MDM  EKG:EKG Interpretation Date/Time:  Sunday July 21 2023 21:32:21 EDT Ventricular Rate:  66 PR Interval:  162 QRS Duration:  88 QT Interval:  458 QTC Calculation: 480 R Axis:   49  Text Interpretation: Normal sinus rhythm Prolonged QT Abnormal ECG When compared with ECG of 25-Apr-2020 15:22, No significant change was found Confirmed by Tanda Rockers ([id number]) on 07/22/2023 7:21:44 AM  I have reviewed the labs performed to date as well as medications administered while in observation.  Recent changes in the last 24 hours include none.  Plan  Current plan is for placement.    Glendora Score, MD 08/16/23 [phone]

## 2023-08-16 NOTE — ED Notes (Signed)
Pt yelling and screaming that she wants a soda. Informed that she isnt getting any more because she has already had 4. Informed that she had get water. Pt still screaming. Educated patient that disruptive behavior will not be tolerated. TV turned off until patient behavior grants return of privileges.

## 2023-08-16 NOTE — ED Notes (Signed)
Patient being calm and cooperative at this time.

## 2023-08-16 NOTE — ED Notes (Signed)
Pt resting quietly though out the night after taking night time medications. No significant events though out the night.

## 2023-08-16 NOTE — ED Notes (Signed)
Pt reports pain on her lower gum and tongue. Sores in her mouth appear improved compared to last week. The sores on her face also appear to be healing compared to earlier in the week.

## 2023-08-17 NOTE — ED Notes (Signed)
Pt taking shower.  

## 2023-08-17 NOTE — ED Notes (Signed)
Pt complains of gum pain from wearing dentures and requests pain medication. Tylenol administered. Pt advised to take dentures out.

## 2023-08-17 NOTE — ED Provider Notes (Signed)
Emergency Medicine Observation Re-evaluation Note  SHIZUKA EIKE is a 57 y.o. female, seen on rounds today.  Pt initially presented to the ED for complaints of Psychiatric Evaluation Currently, the patient is awaiting placement..  Physical Exam  BP 112/61 (BP Location: Right Arm)   Pulse 74   Temp 97.9 F (36.6 C) (Oral)   Resp 18   Wt 102 kg   LMP 10/09/2016 Comment: neg. pregnancy test per RN in chart  SpO2 96%   BMI 33.21 kg/m  Physical Exam General: Nontoxic no acute distress Cardiac:  Lungs: No respiratory distress Psych: Patient cooperative.  ED Course / MDM  EKG:EKG Interpretation Date/Time:  Sunday July 21 2023 21:32:21 EDT Ventricular Rate:  66 PR Interval:  162 QRS Duration:  88 QT Interval:  458 QTC Calculation: 480 R Axis:   49  Text Interpretation: Normal sinus rhythm Prolonged QT Abnormal ECG When compared with ECG of 25-Apr-2020 15:22, No significant change was found Confirmed by Tanda Rockers ([id number]) on 07/22/2023 7:21:44 AM  I have reviewed the labs performed to date as well as medications administered while in observation.  Recent changes in the last 24 hours include nothing significant.  Plan  Current plan is for waiting placement.Vanetta Mulders, MD 08/17/23 [phone]

## 2023-08-18 MED ORDER — LIDOCAINE VISCOUS HCL 2 % MT SOLN
15.0000 mL | Freq: Four times a day (QID) | OROMUCOSAL | Status: DC | PRN
  Administered 2023-08-18 – 2023-08-19 (×3): 15 mL via OROMUCOSAL
  Filled 2023-08-18 (×3): qty 15

## 2023-08-18 NOTE — ED Notes (Signed)
Applied viscous lidocaine to her tongue with gauze and held it in place for several minutes till she said it was numb enough to eat without bothering her.

## 2023-08-18 NOTE — ED Provider Notes (Signed)
Emergency Medicine Observation Re-evaluation Note  Jacqueline [name] is a 57 y.o. female, seen on rounds today.  Pt initially presented to the ED for complaints of Psychiatric Evaluation Currently, the patient is upset because she didn't get a 2nd cup of coffee. She is also requesting lidocaine for her recurrent mouth pain  Physical Exam  BP 118/62 (BP Location: Right Arm)   Pulse 92   Temp 98.3 F (36.8 C) (Oral)   Resp 20   Wt 102 kg   LMP 10/09/2016 Comment: neg. pregnancy test per RN in chart  SpO2 95%   BMI 33.21 kg/m  Physical Exam General: upset Lungs: normal effort Psych: upset about the coffee  ED Course / MDM  EKG:EKG Interpretation Date/Time:  Sunday July 21 2023 21:32:21 EDT Ventricular Rate:  66 PR Interval:  162 QRS Duration:  88 QT Interval:  458 QTC Calculation: 480 R Axis:   49  Text Interpretation: Normal sinus rhythm Prolonged QT Abnormal ECG When compared with ECG of 25-Apr-2020 15:22, No significant change was found Confirmed by Tanda Rockers ([id number]) on 07/22/2023 7:21:44 AM  I have reviewed the labs performed to date as well as medications administered while in observation.  No recent changes in the last 24 hours.  Plan  Current plan is for will give prn lidocaine viscous solution. Pending placement.    Pricilla Loveless, MD 08/18/23 [phone]

## 2023-08-18 NOTE — ED Notes (Signed)
Patient went outside to courtyard with security and sitter

## 2023-08-19 NOTE — ED Notes (Signed)
Pt complains of gum pain and loudly talking. Sitter escorting pt to bathroom.

## 2023-08-19 NOTE — ED Notes (Signed)
Pt eating breakfast and talking. Now going to bathroom to put dentures in.

## 2023-08-19 NOTE — ED Provider Notes (Signed)
Emergency Medicine Observation Re-evaluation Note  Jacqueline [name] is a 57 y.o. female, seen on rounds today.  Pt initially presented to the ED for complaints of Psychiatric Evaluation Currently, the patient is complaining of tongue pain.  Physical Exam  BP 129/87 (BP Location: Right Arm)   Pulse 76   Temp 98.5 F (36.9 C) (Oral)   Resp 20   Wt 102 kg   LMP 10/09/2016 Comment: neg. pregnancy test per RN in chart  SpO2 97%   BMI 33.21 kg/m  Physical Exam General: Eating breakfast HEENT: No vesicles or rashes noted on tongue or buccal mucosa Psych: Calm cooperative  ED Course / MDM  EKG:EKG Interpretation Date/Time:  Sunday July 21 2023 21:32:21 EDT Ventricular Rate:  66 PR Interval:  162 QRS Duration:  88 QT Interval:  458 QTC Calculation: 480 R Axis:   49  Text Interpretation: Normal sinus rhythm Prolonged QT Abnormal ECG When compared with ECG of 25-Apr-2020 15:22, No significant change was found Confirmed by Tanda Rockers ([id number]) on 07/22/2023 7:21:44 AM  I have reviewed the labs performed to date as well as medications administered while in observation.  Recent changes in the last 24 hours include complaining of gum pain and was given viscous lidocaine.  Plan  Current plan is for placement.  Continue viscous lidocaine as needed.    Rondel Baton, MD 08/19/23 1754

## 2023-08-19 NOTE — ED Notes (Signed)
Patient coming to doorway asking for phone call and snacks. Patient easily redirected back to room. Allowed 1 phone call. Patient calm and cooperative at this time

## 2023-08-19 NOTE — ED Notes (Signed)
CSW sent follow up email to [facility] with Laurena Bering to request update for when walk through will be for pts new placement. CSW awaiting updates. TOC to follow.

## 2023-08-20 MED ORDER — BENZOCAINE 10 % MT GEL
Freq: Three times a day (TID) | OROMUCOSAL | Status: DC | PRN
  Administered 2023-08-20 – 2023-08-23 (×5): 1 via OROMUCOSAL
  Filled 2023-08-20 (×2): qty 9

## 2023-08-20 NOTE — ED Notes (Signed)
Pyxis out of Depakote DR. AC to bring pt's dose

## 2023-08-20 NOTE — ED Notes (Signed)
Pt is eating lunch tray.  

## 2023-08-20 NOTE — ED Notes (Signed)
Pt attempted to make a phone call.

## 2023-08-20 NOTE — ED Notes (Signed)
Pt given a laptop and is watching [employer] and being observed by the Sitter, pt is calm and cooperative.

## 2023-08-20 NOTE — ED Provider Notes (Signed)
Emergency Medicine Observation Re-evaluation Note  Jacqueline [name] is a 57 y.o. female, seen on rounds today.  Pt initially presented to the ED for complaints of Psychiatric Evaluation Currently, the patient is sleeping.  Physical Exam  BP (!) 143/84 (BP Location: Right Arm)   Pulse 64   Temp 98.5 F (36.9 C) (Oral)   Resp 19   Wt 102 kg   LMP 10/09/2016 Comment: neg. pregnancy test per RN in chart  SpO2 97%   BMI 33.21 kg/m  Physical Exam General: nad Lungs: equal chest rist Psych: sleeping  ED Course / MDM  EKG:EKG Interpretation Date/Time:  Sunday July 21 2023 21:32:21 EDT Ventricular Rate:  66 PR Interval:  162 QRS Duration:  88 QT Interval:  458 QTC Calculation: 480 R Axis:   49  Text Interpretation: Normal sinus rhythm Prolonged QT Abnormal ECG When compared with ECG of 25-Apr-2020 15:22, No significant change was found Confirmed by Tanda Rockers ([id number]) on 07/22/2023 7:21:44 AM  I have reviewed the labs performed to date as well as medications administered while in observation.  Recent changes in the last 24 hours include no change.  Plan  Current plan is for placement.    Sloan Leiter, DO 08/20/23 [phone]

## 2023-08-20 NOTE — ED Notes (Signed)
Pt eating dinner tray °
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
## 2023-08-21 NOTE — ED Provider Notes (Signed)
Emergency Medicine Observation Re-evaluation Note  Jacqueline [name] is a 57 y.o. female, seen on rounds today.  Pt initially presented to the ED for complaints of Psychiatric Evaluation Currently, the patient is sitting in room.  Physical Exam  BP 119/73 (BP Location: Right Arm)   Pulse 76   Temp 98.2 F (36.8 C) (Oral)   Resp 16   Wt 102 kg   LMP 10/09/2016 Comment: neg. pregnancy test per RN in chart  SpO2 96%   BMI 33.21 kg/m  Physical Exam General: NAD Lungs: no resp distress Psych: calm  ED Course / MDM  EKG:EKG Interpretation Date/Time:  Sunday July 21 2023 21:32:21 EDT Ventricular Rate:  66 PR Interval:  162 QRS Duration:  88 QT Interval:  458 QTC Calculation: 480 R Axis:   49  Text Interpretation: Normal sinus rhythm Prolonged QT Abnormal ECG When compared with ECG of 25-Apr-2020 15:22, No significant change was found Confirmed by Tanda Rockers ([id number]) on 07/22/2023 7:21:44 AM  I have reviewed the labs performed to date as well as medications administered while in observation.  Recent changes in the last 24 hours include no change.  Plan  Current plan is for placement .    Sloan Leiter, DO 08/21/23 [phone]

## 2023-08-21 NOTE — ED Notes (Addendum)
CSW updated by care coordinator Franco Collet with [facility]rena Bering that she is following with the provider at [facility] to schedule a walk through scheduled scheduled within the week and will have a move in date after that. TOC to follow.

## 2023-08-21 NOTE — ED Notes (Signed)
Depakote DR on order for pharmacy. Dose will be delayed until they receive it.

## 2023-08-21 NOTE — ED Notes (Signed)
Marena Chancy [phone] ext [phone]   New legal guardian
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
## 2023-08-22 ENCOUNTER — Emergency Department (HOSPITAL_COMMUNITY): Payer: MEDICAID
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
## 2023-08-22 NOTE — ED Notes (Addendum)
Attempted to make telephone contact with pt's caregiver, but there was no answer and the voice mail box was full. Will try again.

## 2023-08-22 NOTE — ED Notes (Signed)
Pt escalating in her behavior. PO meds provided. Pt spoken to calmly.

## 2023-08-22 NOTE — ED Notes (Signed)
Patient transported to CT via wheelchair

## 2023-08-22 NOTE — ED Notes (Signed)
Pt. Has used the phone and contacted one of her daughters.
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
## 2023-08-22 NOTE — ED Notes (Signed)
Attempted to contact the legal guardian without success. Left a voice message for a return phone call with the ED's telephone number.

## 2023-08-22 NOTE — ED Notes (Signed)
Pt yelling at this nurse, stating, "I need numbing medicine for pain in my mouth" pt repeats this over and over yelling in this nurse face. [employer] officers Arthor Captain, [place], Lt Dubberly come back to pt room as this nurse walks away from pt.

## 2023-08-23 NOTE — TOC CM/SW Note (Addendum)
Meet and Greet with [place] support services  ([place], [place]) completed.  Derek with [facility] reports they would like to accept patient into their program and will need to work with Laurena Bering to finalize the placement including a health and safety inspection for the AFL.  Copies of MAR and progress notes submitted for review per request.  LG has completed all paperwork and submitted to [place] services.

## 2023-08-23 NOTE — ED Notes (Signed)
Patient standing in door way agitation and yelling at staff to give her pain meds; patient asked to give staff a minute to get report and we will address her issue; Pt was non compliant and contained to disrupt shift report; RN verified last time Orajel given and advised patient of next time she could received a dose; Pt was upset and would not accepted this answer; RN consulted with Charge RN due to other patient in unit getting upset; Pt has no sister as there is no indication for need; Pt had all belongings in room upon this RN arrival to unit; Will confirm with Charge if belongings should remain in patient's room-Monique,RN
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
## 2023-08-23 NOTE — Discharge Planning (Signed)
RNCM accessed [employer] Teams meeting for pt to join for interview set up by [place].

## 2023-08-23 NOTE — Progress Notes (Signed)
Per Jacqueline [name], placement with [facility] is no longer an option. Care Coordinator to immediately resume placement search. TOC will follow.
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
## 2023-08-23 NOTE — ED Notes (Signed)
Patient repeatedly calling out for additional doses of Orajel, explained to patient she has received all doses of medication ordered.
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
## 2023-08-23 NOTE — ED Notes (Signed)
Pt accepted to room 52, orientation to unit, orders verified, lunch tray ordered.

## 2023-08-23 NOTE — ED Notes (Signed)
Patient redirected to return to room, patient up yelling out and standing in door way. Patient demanding additional dose of orajel.  Redirected patient to return to room, and of appropriate behavior.

## 2023-08-24 MED ORDER — BENZOCAINE 10 % MT GEL
OROMUCOSAL | Status: DC | PRN
  Administered 2023-08-27 – 2023-08-28 (×3): 1 via OROMUCOSAL
  Filled 2023-08-24 (×2): qty 9

## 2023-08-24 MED ORDER — IBUPROFEN 800 MG PO TABS
800.0000 mg | ORAL_TABLET | Freq: Once | ORAL | Status: AC
  Administered 2023-08-24: 800 mg via ORAL
  Filled 2023-08-24: qty 1

## 2023-08-25 MED ORDER — IBUPROFEN 800 MG PO TABS
800.0000 mg | ORAL_TABLET | Freq: Once | ORAL | Status: AC
  Administered 2023-08-25: 800 mg via ORAL
  Filled 2023-08-25: qty 1

## 2023-08-25 MED ORDER — IBUPROFEN 400 MG PO TABS
600.0000 mg | ORAL_TABLET | Freq: Four times a day (QID) | ORAL | Status: DC | PRN
  Administered 2023-08-25 – 2023-08-29 (×12): 600 mg via ORAL
  Filled 2023-08-25 (×12): qty 1

## 2023-08-25 NOTE — ED Notes (Signed)
Pt yelling loudly multiple times that her mouth is "killing her in pain". This RN informed her that her volume is too loud. She asked if she can have tylenol to which this RN responded that she could not have again until 11pm. She asked for ibuprofen to which this RN informed her was he one time does earlier and it is not ordered for her anymore. She continues to say loudly that her mouth is hurting and that she needs a doctor to come see her. Dr. Estell Harpin in the ED informed of the pts requests and he states he will come take a look at her mouth.

## 2023-08-25 NOTE — ED Provider Notes (Signed)
I evaluated the patient for her gum pain.  She states she cannot wear her dentures now that her gums hurt.  Physical exam did not reveal any abnormalities in her mouth.  Patient is prescribed Motrin to take as needed for pain   Bethann Berkshire, MD 08/25/23 2036

## 2023-08-25 NOTE — ED Provider Notes (Signed)
Emergency Medicine Observation Re-evaluation Note  Jacqueline [name] is a 57 y.o. female, seen on rounds today.  Pt initially presented to the ED for complaints of Psychiatric Evaluation Currently, the patient is awaiting placement.  Physical Exam  BP (!) 145/101 Comment: Pt. pacing and talking during BP  Pulse 71   Temp 97.8 F (36.6 C) (Oral)   Resp 18   Wt 102 kg   LMP 10/09/2016 Comment: neg. pregnancy test per RN in chart  SpO2 98%   BMI 33.21 kg/m  Physical Exam Resting and in no acute distress  ED Course / MDM  EKG:EKG Interpretation Date/Time:  Sunday July 21 2023 21:32:21 EDT Ventricular Rate:  66 PR Interval:  162 QRS Duration:  88 QT Interval:  458 QTC Calculation: 480 R Axis:   49  Text Interpretation: Normal sinus rhythm Prolonged QT Abnormal ECG When compared with ECG of 25-Apr-2020 15:22, No significant change was found Confirmed by Tanda Rockers ([id number]) on 07/22/2023 7:21:44 AM  I have reviewed the labs performed to date as well as medications administered while in observation.  Recent changes in the last 24 hours include none.  Plan  Current plan is for placement.    Bethann Berkshire, MD 08/25/23 1106

## 2023-08-25 NOTE — ED Notes (Addendum)
Pt. Continues to yell "oh shit" loudly from room repeatedly due to mouth pain. RN gave PRN pain meds as well as mouth sore gel during morning VS at 0600. Pt. Door closed after being instructed multiple times to lower her voice in respect to the other pts.

## 2023-08-25 NOTE — ED Notes (Signed)
Pt. Given crackers and diet coke per request

## 2023-08-26 NOTE — ED Notes (Signed)
Pt required multiple redirections to get off the phone d/t time limit reached.

## 2023-08-26 NOTE — Progress Notes (Signed)
[facility] reports they have requested an Urgent UM review with Laurena Bering, awaiting approval for authorization and HCBS before can proceed with accepting patient into their AFL.

## 2023-08-26 NOTE — ED Notes (Signed)
Pt w/ multiple complaints about her gums and asking about meds and being in pain and wanting abx . Notified EDP.

## 2023-08-26 NOTE — ED Notes (Signed)
Pt continues to come to RN desk asking repetitive questions w/ loud speech. Pt requiring multiple times of redirection to go back to her room.

## 2023-08-26 NOTE — ED Notes (Signed)
Messaged pharmacy regarding needing sodium chloride tablet sent from pharmacy

## 2023-08-26 NOTE — ED Notes (Signed)
Pt given saltine crackers , peanut butter and a coke. Updated vs

## 2023-08-26 NOTE — ED Notes (Signed)
Pt demanding tylenol and ibuprofen and orajel. Explained to pt multiple times that she can't have any tylenol or ibuprofen until 1527 and she can't have the orajel until 1636. Pt agitated and requiring multiple attempts to redirect her back to her room.

## 2023-08-26 NOTE — ED Notes (Addendum)
Pt attempted to call daughters and left voicemail on one of their phones. Pt now attempting to call her mother

## 2023-08-26 NOTE — ED Provider Notes (Signed)
Emergency Medicine Observation Re-evaluation Note  Jacqueline [name] is a 57 y.o. female, seen on rounds today.  Pt initially presented to the ED for complaints of Psychiatric Evaluation Currently, the patient is calm cooperative reporting pain in her mouth.  Physical Exam  BP 113/72 (BP Location: Left Arm)   Pulse 69   Temp 98.2 F (36.8 C) (Oral)   Resp 20   Wt 102 kg   LMP 10/09/2016 Comment: neg. pregnancy test per RN in chart  SpO2 97%   BMI 33.21 kg/m  Physical Exam General: Awake. Alert. No acute distress Cardiac: Regular rate rhythm Lungs: Clear to auscultation bilaterally Psych: Calm and cooperative  ED Course / MDM  EKG:EKG Interpretation Date/Time:  Sunday July 21 2023 21:32:21 EDT Ventricular Rate:  66 PR Interval:  162 QRS Duration:  88 QT Interval:  458 QTC Calculation: 480 R Axis:   49  Text Interpretation: Normal sinus rhythm Prolonged QT Abnormal ECG When compared with ECG of 25-Apr-2020 15:22, No significant change was found Confirmed by Tanda Rockers ([id number]) on 07/22/2023 7:21:44 AM  I have reviewed the labs performed to date as well as medications administered while in observation.  Recent changes in the last 24 hours include plan for placement in [facility] awaiting insurance approval  Plan  Current plan is for continued boarding in the ED awaiting [facility] placement.    Royanne Foots, DO 08/26/23 [phone]

## 2023-08-26 NOTE — ED Notes (Signed)
Pt having 1st phone call  ?

## 2023-08-26 NOTE — ED Notes (Signed)
Pt demanding more meds for her mouth. Explained to pt that she can't have tylenol or ibuprofen until 2127 and orajel at 2202. Offered pt ice or heat pack. Pt willing to accept heat pack.

## 2023-08-26 NOTE — ED Notes (Signed)
Pt having 2nd phone call at this time

## 2023-08-26 NOTE — ED Notes (Signed)
Pt was unable to get in touch w/ anyone over the phone. She has left 2 voice messages to the same person and is trying to call multiple people. Pt able to get in touch w/ someone at 1104 (first phone call for the day)

## 2023-08-26 NOTE — ED Notes (Signed)
Heat pack given to pt

## 2023-08-26 NOTE — ED Notes (Signed)
Pt walked towards another pt room to obtain hand sanitizer. Pt did not follow directions to go back to her room. Pt then put hand sanitizer on her face. All hand sanitizers in the zone removed for pt safety. Hand sanitizer remains at [employer], out of reach of pts, for staff use.

## 2023-08-26 NOTE — ED Notes (Signed)
Pt continues to come to RN station asking repetitive questions and demanding things be done for her such as giving her an alcohol swab so she can rub it on her chin which is broken out because she has been rubbing hand sanitizer on it. Hand sanitizer near room was removed to keep pt from rubbing sanitizer on her face.

## 2023-08-27 MED ORDER — SULFAMETHOXAZOLE-TRIMETHOPRIM 800-160 MG PO TABS
1.0000 | ORAL_TABLET | Freq: Two times a day (BID) | ORAL | Status: DC
  Administered 2023-08-27 – 2023-08-29 (×5): 1 via ORAL
  Filled 2023-08-27 (×5): qty 1

## 2023-08-27 NOTE — ED Provider Notes (Signed)
Patient is complaining of toe pain.  Says that this started yesterday and she is having some erythema and swelling around her toe.  See below for images.  Will prescribe her Bactrim at this time for cellulitis.  Aspiration performed with scant amount of bloody/purulent material released.  Not enough for cultures.     Rondel Baton, MD 08/27/23 [phone]

## 2023-08-27 NOTE — ED Notes (Signed)
Updated pt on when she can have tylenol, ibuprofen, and orajel next. Wrote it down on a sticky note for pt. No sores noted in pt mouth upon examination, L big toe has blister to it w/ erythema noted around it. Notified Eloise Harman MD.

## 2023-08-27 NOTE — ED Notes (Signed)
Pt loud, repetitive, demanding, questioning, requiring a lot of redirection as pt does not follow directions/instructions well.

## 2023-08-27 NOTE — ED Provider Notes (Signed)
Emergency Medicine Observation Re-evaluation Note  Jacqueline [name] is a 57 y.o. female, seen on rounds today.  Pt initially presented to the ED for complaints of Psychiatric Evaluation Currently, the patient is not having any acute complaints.  Physical Exam  BP 130/79   Pulse 70   Temp 98.2 F (36.8 C) (Oral)   Resp 15   Wt 102 kg   LMP 10/09/2016 Comment: neg. pregnancy test per RN in chart  SpO2 100%   BMI 33.21 kg/m  Physical Exam General: Resting comfortably in stretcher Lungs: Normal work of breathing Psych: Calm  ED Course / MDM  EKG:EKG Interpretation Date/Time:  Sunday July 21 2023 21:32:21 EDT Ventricular Rate:  66 PR Interval:  162 QRS Duration:  88 QT Interval:  458 QTC Calculation: 480 R Axis:   49  Text Interpretation: Normal sinus rhythm Prolonged QT Abnormal ECG When compared with ECG of 25-Apr-2020 15:22, No significant change was found Confirmed by Tanda Rockers ([id number]) on 07/22/2023 7:21:44 AM  I have reviewed the labs performed to date as well as medications administered while in observation.  Recent changes in the last 24 hours include no acute events.  Plan  Current plan is for awaiting placement.    Rondel Baton, MD 08/27/23 [phone]

## 2023-08-27 NOTE — ED Notes (Signed)
 Report received from Selinda Eon RN. Assumed care of pt at this time.

## 2023-08-27 NOTE — ED Notes (Signed)
Pt coming to RN station multiple times requiring multiple times of redirection back to her room. Pt appears anxious as well.

## 2023-08-27 NOTE — ED Notes (Signed)
Pt continues to come to RN station asking about snacks when it isn't snack time and rules of zone have been explained to pt multiple times. Pt has been given breakfast, snack, snack, and lunch. Pt keeps asking about pain meds, etc. Pt has been instructed multiple times to go back to her room and that she needs to follow instructions.

## 2023-08-28 MED ORDER — SULFAMETHOXAZOLE-TRIMETHOPRIM 800-160 MG PO TABS: 1.0000 | ORAL_TABLET | Freq: Two times a day (BID) | ORAL | 0 refills | Status: AC

## 2023-08-28 MED ORDER — CEPHALEXIN 250 MG PO CAPS
500.0000 mg | ORAL_CAPSULE | Freq: Four times a day (QID) | ORAL | Status: DC
  Administered 2023-08-28 – 2023-08-29 (×4): 500 mg via ORAL
  Filled 2023-08-28 (×4): qty 2

## 2023-08-28 MED ORDER — CEPHALEXIN 500 MG PO CAPS: 500.0000 mg | ORAL_CAPSULE | Freq: Four times a day (QID) | ORAL | 0 refills | Status: AC

## 2023-08-28 MED ORDER — CEFTRIAXONE SODIUM 500 MG IJ SOLR: 500.0000 mg | Freq: Once | INTRAMUSCULAR | Status: DC

## 2023-08-28 NOTE — ED Notes (Signed)
Pt seen once again picking at toe wound and states it is "leaking"; Upon assessment RN does not notice any drainage on toe and asked pt again to not pick at wound; RN asked patient to put socks on so it would help keep area clean; pt refused-Monique,RN

## 2023-08-28 NOTE — ED Provider Notes (Addendum)
Emergency Medicine Observation Re-evaluation Note  Jacqueline [name] is a 57 y.o. female, seen on rounds today.  Pt initially presented to the ED for complaints of Psychiatric Evaluation Currently, the patient is in her room rubbing at her toe.  Physical Exam  BP (!) 100/53   Pulse 79   Temp 98 F (36.7 C) (Oral)   Resp 18   Wt 102 kg   LMP 10/09/2016 Comment: neg. pregnancy test per RN in chart  SpO2 98%   BMI 33.21 kg/m  Physical Exam Cardiovascular:     Rate and Rhythm: Normal rate.     Pulses: Normal pulses.  Skin:    Comments: Some erythema surrounding blister on the right great toe  Neurological:     Mental Status: She is alert.     ED Course / MDM  EKG:EKG Interpretation Date/Time:  Sunday July 21 2023 21:32:21 EDT Ventricular Rate:  66 PR Interval:  162 QRS Duration:  88 QT Interval:  458 QTC Calculation: 480 R Axis:   49  Text Interpretation: Normal sinus rhythm Prolonged QT Abnormal ECG When compared with ECG of 25-Apr-2020 15:22, No significant change was found Confirmed by Tanda Rockers ([id number]) on 07/22/2023 7:21:44 AM  I have reviewed the labs performed to date as well as medications administered while in observation.  Recent changes in the last 24 hours include starting the patient on Bactrim for her cellulitis.  Compared the patient's toe today to the picture from yesterday.  There is a little bit more surrounding erythema.  I think part of that is because patient is rubbing it so much.  Patient has started receiving Bactrim yesterday.  Will add in some Keflex for her to cover streptococcal species.  Plan  Current plan is placement, hopefully on 11/21.    Anders Simmonds T, DO 08/28/23 1023    Anders Simmonds T, DO 08/28/23 1155

## 2023-08-28 NOTE — ED Notes (Addendum)
Patient seen picking at big toe wound and pulled dressing off; dressing not soiled and RN changed dressing and applied ointment; RN advised patient to not pick at wound and to leave dressing in tack-Monique, RN

## 2023-08-28 NOTE — TOC CM/SW Note (Signed)
Jacqueline [name] has approved placement at [facility] support services, a health and safety walk though will be completed either today or tomorrow morning per provider.  A 30 day supply of medications will need to be sent into the pharmacy of choice once TOC receives this information from [facility].  Patient's transition date will be determined once health and safety is complete.

## 2023-08-28 NOTE — ED Notes (Signed)
Wrapped pt great left toe due to pt picking at it and c/o puss coming from it. Pt instructed to leave bandage on foot. Clean socks placed on pt.

## 2023-08-28 NOTE — ED Notes (Signed)
Pt made phone call. 

## 2023-08-28 NOTE — Progress Notes (Signed)
[facility] report plan to arrive to [facility] on Thursday 11/21 at 0930 to pick up patient and transport to [facility].  Request a 30 day supply of medication to be prescribed to [facility] in [place].  Bedside RN and MD made aware of these updates.

## 2023-08-28 NOTE — ED Notes (Signed)
Provider at bedside to look at pt foot.

## 2023-08-29 MED ORDER — CLONAZEPAM 1 MG PO TABS: 0.5000 mg | ORAL_TABLET | Freq: Three times a day (TID) | ORAL | 0 refills | Status: AC | PRN

## 2023-08-29 MED ORDER — PANTOPRAZOLE SODIUM 40 MG PO TBEC: 40.0000 mg | DELAYED_RELEASE_TABLET | Freq: Two times a day (BID) | ORAL | 0 refills | Status: AC

## 2023-08-29 MED ORDER — GABAPENTIN 300 MG PO CAPS: 300.0000 mg | ORAL_CAPSULE | Freq: Every day | ORAL | 0 refills | Status: AC

## 2023-08-29 MED ORDER — QUETIAPINE FUMARATE 25 MG PO TABS: 25.0000 mg | ORAL_TABLET | Freq: Two times a day (BID) | ORAL | 0 refills | Status: AC

## 2023-08-29 MED ORDER — GABAPENTIN 300 MG PO CAPS: 600.0000 mg | ORAL_CAPSULE | Freq: Every day | ORAL | 0 refills | Status: AC

## 2023-08-29 MED ORDER — FLUTICASONE PROPIONATE 50 MCG/ACT NA SUSP
1.0000 | Freq: Every day | NASAL | 2 refills | Status: AC
Start: 2023-08-29 — End: ?

## 2023-08-29 MED ORDER — OLANZAPINE 5 MG PO TABS: 5.0000 mg | ORAL_TABLET | Freq: Three times a day (TID) | ORAL | 0 refills | Status: AC | PRN

## 2023-08-29 MED ORDER — HYDROXYZINE HCL 25 MG PO TABS: 25.0000 mg | ORAL_TABLET | Freq: Three times a day (TID) | ORAL | 0 refills | Status: AC

## 2023-08-29 MED ORDER — SENNOSIDES-DOCUSATE SODIUM 8.6-50 MG PO TABS: 1.0000 | ORAL_TABLET | Freq: Every day | ORAL | 0 refills | Status: AC

## 2023-08-29 MED ORDER — BISACODYL 5 MG PO TBEC: 5.0000 mg | DELAYED_RELEASE_TABLET | Freq: Two times a day (BID) | ORAL | 0 refills | Status: AC

## 2023-08-29 MED ORDER — QUETIAPINE FUMARATE 200 MG PO TABS: 200.0000 mg | ORAL_TABLET | Freq: Every day | ORAL | 0 refills | Status: AC

## 2023-08-29 MED ORDER — MELOXICAM 7.5 MG PO TABS: 7.5000 mg | ORAL_TABLET | Freq: Every day | ORAL | 0 refills | Status: AC

## 2023-08-29 MED ORDER — DIVALPROEX SODIUM 500 MG PO DR TAB: 1500.0000 mg | DELAYED_RELEASE_TABLET | Freq: Every day | ORAL | 0 refills | Status: AC

## 2023-08-29 MED ORDER — ALBUTEROL SULFATE HFA 108 (90 BASE) MCG/ACT IN AERS
1.0000 | INHALATION_SPRAY | Freq: Four times a day (QID) | RESPIRATORY_TRACT | 0 refills | Status: AC | PRN
Start: 2023-08-29 — End: ?

## 2023-08-29 MED ORDER — DIVALPROEX SODIUM 500 MG PO DR TAB: 1000.0000 mg | DELAYED_RELEASE_TABLET | Freq: Every day | ORAL | 0 refills | Status: AC

## 2023-08-29 MED ORDER — FOLIC ACID 1 MG PO TABS: 1.0000 mg | ORAL_TABLET | Freq: Every day | ORAL | 0 refills | Status: AC

## 2023-08-29 MED ORDER — ESCITALOPRAM OXALATE 10 MG PO TABS: 10.0000 mg | ORAL_TABLET | Freq: Every day | ORAL | 0 refills | Status: AC

## 2023-08-29 MED ORDER — SODIUM CHLORIDE 1 G PO TABS: 1.0000 g | ORAL_TABLET | Freq: Three times a day (TID) | ORAL | 0 refills | Status: AC

## 2023-08-29 NOTE — ED Notes (Signed)
Patient continues to yell and holler keeping other patients awake; RN has set up outside of patient door to attempt to keep her from continuing to come out in the unit and being disruptive; pt is manipulative and demanding; RN will continue to monitor for need to consult with EDP for a different plan of care-Monique,RN

## 2023-08-29 NOTE — ED Notes (Signed)
Patient has been hollering and yelling in the unit for a few hours now; not following directions and disrupting the other patients; RN administered PRN PO med at this time-Monique,RN

## 2023-08-29 NOTE — ED Notes (Addendum)
Patient was picked up by Candis Schatz, RN from [employer] at [phone].

## 2023-08-29 NOTE — ED Provider Notes (Addendum)
Emergency Medicine Observation Re-evaluation Note  Jacqueline [name] is a 57 y.o. female, seen on rounds today.  Pt initially presented to the ED for complaints of Psychiatric Evaluation Currently, the patient is resting quietly.  Physical Exam  BP (!) 100/53   Pulse 79   Temp 98 F (36.7 C) (Oral)   Resp 18   Wt 102 kg   LMP 10/09/2016 Comment: neg. pregnancy test per RN in chart  SpO2 98%   BMI 33.21 kg/m  Physical Exam General: No acute distress Cardiac: Well-perfused Lungs: Nonlabored Psych: Cooperative  ED Course / MDM  EKG:EKG Interpretation Date/Time:  Sunday July 21 2023 21:32:21 EDT Ventricular Rate:  66 PR Interval:  162 QRS Duration:  88 QT Interval:  458 QTC Calculation: 480 R Axis:   49  Text Interpretation: Normal sinus rhythm Prolonged QT Abnormal ECG When compared with ECG of 25-Apr-2020 15:22, No significant change was found Confirmed by Tanda Rockers ([id number]) on 07/22/2023 7:21:44 AM  I have reviewed the labs performed to date as well as medications administered while in observation.  Recent changes in the last 24 hours include increased disruptive behavior ongoing toe wound.  Plan  Current plan is for continue antibiotics and anticipate placement.    Jacqueline Files, MD 08/29/23 [phone]  Patient is apparently being transferred to [facility].  I have completed a 30-day supply of her medications and sent to pharmacy prescription pad in [facility]   Jacqueline Files, MD 08/29/23 2029
